# Patient Record
Sex: Male | Born: 1950 | Race: White | Hispanic: No | Marital: Married | Smoking: Never smoker
Health system: Southern US, Community
[De-identification: ages and names within clinical notes are randomized; demographics above are authoritative.]

## PROBLEM LIST (undated history)

## (undated) DIAGNOSIS — K592 Neurogenic bowel, not elsewhere classified: Secondary | ICD-10-CM

## (undated) DIAGNOSIS — I82409 Acute embolism and thrombosis of unspecified deep veins of unspecified lower extremity: Secondary | ICD-10-CM

## (undated) DIAGNOSIS — S14109A Unspecified injury at unspecified level of cervical spinal cord, initial encounter: Secondary | ICD-10-CM

## (undated) DIAGNOSIS — S069X9A Unspecified intracranial injury with loss of consciousness of unspecified duration, initial encounter: Secondary | ICD-10-CM

## (undated) DIAGNOSIS — J181 Lobar pneumonia, unspecified organism: Secondary | ICD-10-CM

## (undated) DIAGNOSIS — J9621 Acute and chronic respiratory failure with hypoxia: Secondary | ICD-10-CM

## (undated) DIAGNOSIS — I10 Essential (primary) hypertension: Secondary | ICD-10-CM

## (undated) DIAGNOSIS — R7881 Bacteremia: Secondary | ICD-10-CM

## (undated) DIAGNOSIS — N400 Enlarged prostate without lower urinary tract symptoms: Secondary | ICD-10-CM

## (undated) DIAGNOSIS — F1011 Alcohol abuse, in remission: Secondary | ICD-10-CM

---

## 1998-08-02 ENCOUNTER — Ambulatory Visit (HOSPITAL_BASED_OUTPATIENT_CLINIC_OR_DEPARTMENT_OTHER): Admission: RE | Admit: 1998-08-02 | Discharge: 1998-08-02 | Payer: Self-pay | Admitting: General Surgery

## 2011-06-20 DIAGNOSIS — S069XAA Unspecified intracranial injury with loss of consciousness status unknown, initial encounter: Secondary | ICD-10-CM | POA: Diagnosis present

## 2011-06-20 DIAGNOSIS — S069X9A Unspecified intracranial injury with loss of consciousness of unspecified duration, initial encounter: Secondary | ICD-10-CM

## 2011-06-20 HISTORY — DX: Unspecified intracranial injury with loss of consciousness of unspecified duration, initial encounter: S06.9X9A

## 2011-06-20 HISTORY — DX: Unspecified intracranial injury with loss of consciousness status unknown, initial encounter: S06.9XAA

## 2012-04-28 ENCOUNTER — Encounter (HOSPITAL_COMMUNITY): Payer: Self-pay

## 2012-04-28 ENCOUNTER — Emergency Department (HOSPITAL_COMMUNITY): Payer: 59

## 2012-04-28 ENCOUNTER — Inpatient Hospital Stay (HOSPITAL_COMMUNITY)
Admission: EM | Admit: 2012-04-28 | Discharge: 2012-05-07 | DRG: 025 | Disposition: A | Discharge status: Rehabilitation Facility | Payer: 59 | Attending: Internal Medicine | Admitting: Internal Medicine

## 2012-04-28 ENCOUNTER — Encounter (HOSPITAL_COMMUNITY): Payer: Self-pay | Admitting: Anesthesiology

## 2012-04-28 ENCOUNTER — Encounter (HOSPITAL_COMMUNITY): Payer: Self-pay | Admitting: *Deleted

## 2012-04-28 ENCOUNTER — Inpatient Hospital Stay (HOSPITAL_COMMUNITY): Payer: 59 | Admitting: Anesthesiology

## 2012-04-28 ENCOUNTER — Encounter (HOSPITAL_COMMUNITY)
Admission: EM | Disposition: A | Discharge status: Rehabilitation Facility | Payer: Self-pay | Source: Home / Self Care | Attending: Internal Medicine

## 2012-04-28 DIAGNOSIS — Y92009 Unspecified place in unspecified non-institutional (private) residence as the place of occurrence of the external cause: Secondary | ICD-10-CM

## 2012-04-28 DIAGNOSIS — J9600 Acute respiratory failure, unspecified whether with hypoxia or hypercapnia: Secondary | ICD-10-CM | POA: Diagnosis present

## 2012-04-28 DIAGNOSIS — R4182 Altered mental status, unspecified: Secondary | ICD-10-CM | POA: Diagnosis present

## 2012-04-28 DIAGNOSIS — E872 Acidosis, unspecified: Secondary | ICD-10-CM | POA: Diagnosis present

## 2012-04-28 DIAGNOSIS — T82898A Other specified complication of vascular prosthetic devices, implants and grafts, initial encounter: Secondary | ICD-10-CM | POA: Diagnosis not present

## 2012-04-28 DIAGNOSIS — E876 Hypokalemia: Secondary | ICD-10-CM | POA: Diagnosis not present

## 2012-04-28 DIAGNOSIS — Y849 Medical procedure, unspecified as the cause of abnormal reaction of the patient, or of later complication, without mention of misadventure at the time of the procedure: Secondary | ICD-10-CM | POA: Diagnosis not present

## 2012-04-28 DIAGNOSIS — E871 Hypo-osmolality and hyponatremia: Secondary | ICD-10-CM | POA: Diagnosis present

## 2012-04-28 DIAGNOSIS — J690 Pneumonitis due to inhalation of food and vomit: Secondary | ICD-10-CM | POA: Diagnosis not present

## 2012-04-28 DIAGNOSIS — S065X9A Traumatic subdural hemorrhage with loss of consciousness of unspecified duration, initial encounter: Principal | ICD-10-CM | POA: Diagnosis present

## 2012-04-28 DIAGNOSIS — J00 Acute nasopharyngitis [common cold]: Secondary | ICD-10-CM | POA: Diagnosis present

## 2012-04-28 DIAGNOSIS — F1010 Alcohol abuse, uncomplicated: Secondary | ICD-10-CM | POA: Diagnosis present

## 2012-04-28 DIAGNOSIS — S0100XA Unspecified open wound of scalp, initial encounter: Secondary | ICD-10-CM | POA: Diagnosis present

## 2012-04-28 DIAGNOSIS — I10 Essential (primary) hypertension: Secondary | ICD-10-CM | POA: Diagnosis present

## 2012-04-28 DIAGNOSIS — F1210 Cannabis abuse, uncomplicated: Secondary | ICD-10-CM | POA: Diagnosis present

## 2012-04-28 DIAGNOSIS — R339 Retention of urine, unspecified: Secondary | ICD-10-CM

## 2012-04-28 DIAGNOSIS — G9340 Encephalopathy, unspecified: Secondary | ICD-10-CM

## 2012-04-28 DIAGNOSIS — I6200 Nontraumatic subdural hemorrhage, unspecified: Secondary | ICD-10-CM

## 2012-04-28 DIAGNOSIS — I82629 Acute embolism and thrombosis of deep veins of unspecified upper extremity: Secondary | ICD-10-CM | POA: Diagnosis not present

## 2012-04-28 DIAGNOSIS — W010XXA Fall on same level from slipping, tripping and stumbling without subsequent striking against object, initial encounter: Secondary | ICD-10-CM | POA: Diagnosis present

## 2012-04-28 DIAGNOSIS — Y9210 Unspecified residential institution as the place of occurrence of the external cause: Secondary | ICD-10-CM | POA: Diagnosis not present

## 2012-04-28 DIAGNOSIS — D638 Anemia in other chronic diseases classified elsewhere: Secondary | ICD-10-CM | POA: Diagnosis present

## 2012-04-28 HISTORY — PX: CRANIOTOMY: SHX93

## 2012-04-28 HISTORY — DX: Essential (primary) hypertension: I10

## 2012-04-28 LAB — POCT I-STAT, CHEM 8
BUN: 11 mg/dL (ref 6–23)
Calcium, Ion: 1.05 mmol/L — ABNORMAL LOW (ref 1.13–1.30)
Chloride: 100 mEq/L (ref 96–112)
Glucose, Bld: 178 mg/dL — ABNORMAL HIGH (ref 70–99)
TCO2: 20 mmol/L (ref 0–100)

## 2012-04-28 LAB — PROTIME-INR
INR: 1.06 (ref 0.00–1.49)
Prothrombin Time: 13.7 seconds (ref 11.6–15.2)

## 2012-04-28 LAB — COMPREHENSIVE METABOLIC PANEL
CO2: 19 mEq/L (ref 19–32)
Calcium: 8.6 mg/dL (ref 8.4–10.5)
Creatinine, Ser: 0.56 mg/dL (ref 0.50–1.35)
GFR calc Af Amer: >90 mL/min (ref >90)
GFR calc non Af Amer: >90 mL/min (ref >90)
Glucose, Bld: 180 mg/dL — ABNORMAL HIGH (ref 70–99)
Sodium: 130 mEq/L — ABNORMAL LOW (ref 135–145)
Total Protein: 7.9 g/dL (ref 6.0–8.3)

## 2012-04-28 LAB — URINALYSIS, ROUTINE W REFLEX MICROSCOPIC
Bilirubin Urine: NEGATIVE
Ketones, ur: 15 mg/dL — AB
Leukocytes, UA: NEGATIVE
Nitrite: NEGATIVE
Protein, ur: NEGATIVE mg/dL
Urobilinogen, UA: 0.2 mg/dL (ref 0.0–1.0)

## 2012-04-28 LAB — LACTIC ACID, PLASMA: Lactic Acid, Venous: 5.7 mmol/L — ABNORMAL HIGH (ref 0.5–2.2)

## 2012-04-28 LAB — GLUCOSE, CAPILLARY: Glucose-Capillary: 107 mg/dL — ABNORMAL HIGH (ref 70–99)

## 2012-04-28 LAB — PHOSPHORUS: Phosphorus: 3.4 mg/dL (ref 2.3–4.6)

## 2012-04-28 LAB — MAGNESIUM: Magnesium: 2.2 mg/dL (ref 1.5–2.5)

## 2012-04-28 LAB — POCT I-STAT 3, ART BLOOD GAS (G3+)
Bicarbonate: 19.4 mEq/L — ABNORMAL LOW (ref 20.0–24.0)
pH, Arterial: 7.277 — ABNORMAL LOW (ref 7.350–7.450)

## 2012-04-28 LAB — PROCALCITONIN: Procalcitonin: <0.1 ng/mL

## 2012-04-28 LAB — CBC WITH DIFFERENTIAL/PLATELET
Basophils Absolute: 0 10*3/uL (ref 0.0–0.1)
Basophils Relative: 0 % (ref 0–1)
Eosinophils Absolute: 0.1 10*3/uL (ref 0.0–0.7)
Hemoglobin: 15.9 g/dL (ref 13.0–17.0)
MCH: 31.7 pg (ref 26.0–34.0)
MCHC: 36.1 g/dL — ABNORMAL HIGH (ref 30.0–36.0)
Monocytes Relative: 6 % (ref 3–12)
Neutrophils Relative %: 87 % — ABNORMAL HIGH (ref 43–77)
Platelets: 375 10*3/uL (ref 150–400)
RDW: 13 % (ref 11.5–15.5)

## 2012-04-28 LAB — RAPID URINE DRUG SCREEN, HOSP PERFORMED
Opiates: NOT DETECTED
Tetrahydrocannabinol: POSITIVE — AB

## 2012-04-28 LAB — POCT I-STAT 4, (NA,K, GLUC, HGB,HCT)
Glucose, Bld: 110 mg/dL — ABNORMAL HIGH (ref 70–99)
Hemoglobin: 12.6 g/dL — ABNORMAL LOW (ref 13.0–17.0)

## 2012-04-28 LAB — URINE MICROSCOPIC-ADD ON

## 2012-04-28 LAB — MRSA PCR SCREENING: MRSA by PCR: NEGATIVE

## 2012-04-28 SURGERY — CRANIOTOMY HEMATOMA EVACUATION SUBDURAL
Anesthesia: General | Site: Head | Laterality: Left | Wound class: Clean

## 2012-04-28 MED ORDER — DEXMEDETOMIDINE HCL IN NACL 400 MCG/100ML IV SOLN
0.4000 ug/kg/h | INTRAVENOUS | Status: DC
  Filled 2012-04-28: qty 100

## 2012-04-28 MED ORDER — ETOMIDATE 2 MG/ML IV SOLN
20.0000 mg | Freq: Once | INTRAVENOUS | Status: AC
  Administered 2012-04-28: 20 mg via INTRAVENOUS

## 2012-04-28 MED ORDER — PROPOFOL 10 MG/ML IV EMUL
INTRAVENOUS | Status: AC
  Administered 2012-04-29: 40 ug/kg/min via INTRAVENOUS
  Filled 2012-04-28: qty 100

## 2012-04-28 MED ORDER — BACITRACIN ZINC 500 UNIT/GM EX OINT
TOPICAL_OINTMENT | CUTANEOUS | Status: DC | PRN
  Administered 2012-04-28: 1 via TOPICAL

## 2012-04-28 MED ORDER — CEFAZOLIN SODIUM 1-5 GM-% IV SOLN
INTRAVENOUS | Status: DC | PRN
  Administered 2012-04-28: 2 g via INTRAVENOUS

## 2012-04-28 MED ORDER — SODIUM CHLORIDE 0.9 % IV SOLN
INTRAVENOUS | Status: DC | PRN
  Administered 2012-04-28: 15:00:00 via INTRAVENOUS

## 2012-04-28 MED ORDER — DEXMEDETOMIDINE HCL IN NACL 200 MCG/50ML IV SOLN: 0.4000 ug/kg/h | INTRAVENOUS | Status: DC

## 2012-04-28 MED ORDER — SODIUM CHLORIDE 0.9 % IV SOLN
1000.0000 mL | Freq: Once | INTRAVENOUS | Status: AC
  Administered 2012-04-28: 1000 mL via INTRAVENOUS

## 2012-04-28 MED ORDER — THROMBIN 20000 UNITS EX SOLR
CUTANEOUS | Status: DC | PRN
  Administered 2012-04-28 (×2): via TOPICAL

## 2012-04-28 MED ORDER — FOLIC ACID 1 MG PO TABS
1.0000 mg | ORAL_TABLET | Freq: Every day | ORAL | Status: DC
  Administered 2012-04-29 – 2012-05-07 (×8): 1 mg via ORAL
  Filled 2012-04-28 (×9): qty 1

## 2012-04-28 MED ORDER — VECURONIUM BROMIDE 10 MG IV SOLR
INTRAVENOUS | Status: DC | PRN
  Administered 2012-04-28: 10 mg via INTRAVENOUS
  Administered 2012-04-28 (×2): 5 mg via INTRAVENOUS

## 2012-04-28 MED ORDER — FENTANYL CITRATE 0.05 MG/ML IJ SOLN
50.0000 ug | INTRAMUSCULAR | Status: DC | PRN
  Administered 2012-04-28: 100 ug via INTRAVENOUS
  Administered 2012-04-29: 75 ug via INTRAVENOUS
  Administered 2012-04-29: 100 ug via INTRAVENOUS
  Administered 2012-04-29 – 2012-05-02 (×6): 50 ug via INTRAVENOUS
  Filled 2012-04-28 (×9): qty 2

## 2012-04-28 MED ORDER — CEFAZOLIN SODIUM-DEXTROSE 2-3 GM-% IV SOLR
2.0000 g | Freq: Three times a day (TID) | INTRAVENOUS | Status: AC
  Administered 2012-04-29 (×4): 2 g via INTRAVENOUS
  Filled 2012-04-28 (×4): qty 50

## 2012-04-28 MED ORDER — FENTANYL CITRATE 0.05 MG/ML IJ SOLN
INTRAMUSCULAR | Status: DC | PRN
  Administered 2012-04-28: 125 ug via INTRAVENOUS
  Administered 2012-04-28 (×2): 250 ug via INTRAVENOUS
  Administered 2012-04-28: 125 ug via INTRAVENOUS

## 2012-04-28 MED ORDER — SODIUM CHLORIDE 0.9 % IV SOLN
500.0000 mg | INTRAVENOUS | Status: AC
  Administered 2012-04-28: 500 mg via INTRAVENOUS
  Filled 2012-04-28: qty 5

## 2012-04-28 MED ORDER — PHENYLEPHRINE HCL 10 MG/ML IJ SOLN
10.0000 mg | INTRAVENOUS | Status: DC | PRN
  Administered 2012-04-28: 60 ug/min via INTRAVENOUS

## 2012-04-28 MED ORDER — SUCCINYLCHOLINE CHLORIDE 20 MG/ML IJ SOLN
100.0000 mg | Freq: Once | INTRAMUSCULAR | Status: AC
  Administered 2012-04-28: 100 mg via INTRAVENOUS

## 2012-04-28 MED ORDER — MIDAZOLAM HCL 5 MG/5ML IJ SOLN
INTRAMUSCULAR | Status: DC | PRN
  Administered 2012-04-28: 4 mg via INTRAVENOUS

## 2012-04-28 MED ORDER — DEXMEDETOMIDINE HCL IN NACL 200 MCG/50ML IV SOLN
INTRAVENOUS | Status: DC | PRN
  Administered 2012-04-28: 0.7 ug/kg/h via INTRAVENOUS

## 2012-04-28 MED ORDER — THIAMINE HCL 100 MG/ML IJ SOLN
100.0000 mg | Freq: Every day | INTRAMUSCULAR | Status: DC
  Administered 2012-04-29 – 2012-05-04 (×6): 100 mg via INTRAVENOUS
  Filled 2012-04-28 (×6): qty 1

## 2012-04-28 MED ORDER — MANNITOL 20 % IV SOLN
INTRAVENOUS | Status: DC | PRN
  Administered 2012-04-28: 15:00:00 via INTRAVENOUS

## 2012-04-28 MED ORDER — SODIUM CHLORIDE 0.9 % IV SOLN: 1000.0000 mL | Freq: Once | INTRAVENOUS | Status: DC

## 2012-04-28 MED ORDER — CHLORHEXIDINE GLUCONATE 0.12 % MT SOLN
15.0000 mL | Freq: Two times a day (BID) | OROMUCOSAL | Status: DC
  Administered 2012-04-28 – 2012-05-02 (×8): 15 mL via OROMUCOSAL
  Filled 2012-04-28 (×8): qty 15

## 2012-04-28 MED ORDER — HEMOSTATIC AGENTS (NO CHARGE) OPTIME
TOPICAL | Status: DC | PRN
  Administered 2012-04-28: 1 via TOPICAL

## 2012-04-28 MED ORDER — LIDOCAINE HCL 1 % IJ SOLN
INTRAMUSCULAR | Status: DC | PRN
  Administered 2012-04-28: 10 mL

## 2012-04-28 MED ORDER — SODIUM CHLORIDE 0.9 % IV SOLN
250.0000 mL | INTRAVENOUS | Status: DC | PRN
  Administered 2012-04-29: 10 mL via INTRAVENOUS
  Administered 2012-04-29: 250 mL via INTRAVENOUS

## 2012-04-28 MED ORDER — SODIUM CHLORIDE 0.9 % IV SOLN: 1000.0000 mL | INTRAVENOUS | Status: DC

## 2012-04-28 MED ORDER — 0.9 % SODIUM CHLORIDE (POUR BTL) OPTIME
TOPICAL | Status: DC | PRN
  Administered 2012-04-28 (×2): 1000 mL

## 2012-04-28 MED ORDER — BIOTENE DRY MOUTH MT LIQD
15.0000 mL | Freq: Four times a day (QID) | OROMUCOSAL | Status: DC
  Administered 2012-04-29 – 2012-05-02 (×16): 15 mL via OROMUCOSAL

## 2012-04-28 MED ORDER — PANTOPRAZOLE SODIUM 40 MG IV SOLR
40.0000 mg | Freq: Every day | INTRAVENOUS | Status: DC
  Administered 2012-04-28 – 2012-04-29 (×2): 40 mg via INTRAVENOUS
  Filled 2012-04-28 (×3): qty 40

## 2012-04-28 MED ORDER — SODIUM CHLORIDE 0.9 % IR SOLN
Status: DC | PRN
  Administered 2012-04-28: 15:00:00

## 2012-04-28 MED ORDER — SODIUM CHLORIDE 0.9 % IV SOLN
INTRAVENOUS | Status: DC
  Administered 2012-04-29: 10:00:00 via INTRAVENOUS
  Administered 2012-05-01: 75 mL/h via INTRAVENOUS
  Administered 2012-05-02: 11:00:00 via INTRAVENOUS

## 2012-04-28 MED ORDER — PROPOFOL 10 MG/ML IV EMUL
5.0000 ug/kg/min | INTRAVENOUS | Status: DC
  Administered 2012-04-28: 15 ug/kg/min via INTRAVENOUS
  Administered 2012-04-28: 30 ug/kg/min via INTRAVENOUS
  Administered 2012-04-29 (×3): 40 ug/kg/min via INTRAVENOUS
  Administered 2012-04-29 – 2012-05-01 (×8): 50 ug/kg/min via INTRAVENOUS
  Administered 2012-05-01: 20 ug/kg/min via INTRAVENOUS
  Administered 2012-05-01: 10 ug/kg/min via INTRAVENOUS
  Administered 2012-05-01: 50 ug/kg/min via INTRAVENOUS
  Administered 2012-05-02: 20 ug/kg/min via INTRAVENOUS
  Filled 2012-04-28 (×17): qty 100

## 2012-04-28 SURGICAL SUPPLY — 56 items
BAG DECANTER FOR FLEXI CONT (MISCELLANEOUS) ×2 IMPLANT
BANDAGE GAUZE 4  KLING STR (GAUZE/BANDAGES/DRESSINGS) ×4 IMPLANT
BANDAGE GAUZE ELAST BULKY 4 IN (GAUZE/BANDAGES/DRESSINGS) ×4 IMPLANT
BLADE SURG ROTATE 9660 (MISCELLANEOUS) ×2 IMPLANT
BRUSH SCRUB EZ PLAIN DRY (MISCELLANEOUS) ×4 IMPLANT
BUR ACORN 6.0 PRECISION (BURR) ×2 IMPLANT
BUR ROUTER D-58 CRANI (BURR) ×2 IMPLANT
CANISTER SUCTION 2500CC (MISCELLANEOUS) ×4 IMPLANT
CONT SPEC 4OZ CLIKSEAL STRL BL (MISCELLANEOUS) ×2 IMPLANT
CORDS BIPOLAR (ELECTRODE) ×2 IMPLANT
COVER TABLE BACK 60X90 (DRAPES) ×2 IMPLANT
DECANTER SPIKE VIAL GLASS SM (MISCELLANEOUS) ×4 IMPLANT
DRAIN CHANNEL 7F FF FLAT (WOUND CARE) ×2 IMPLANT
DRAPE INCISE IOBAN 66X45 STRL (DRAPES) ×2 IMPLANT
DRAPE NEUROLOGICAL W/INCISE (DRAPES) ×2 IMPLANT
DRAPE WARM FLUID 44X44 (DRAPE) ×2 IMPLANT
DRESSING TELFA 8X3 (GAUZE/BANDAGES/DRESSINGS) ×4 IMPLANT
ELECT CAUTERY BLADE 6.4 (BLADE) ×2 IMPLANT
ELECT REM PT RETURN 9FT ADLT (ELECTROSURGICAL) ×2
ELECTRODE REM PT RTRN 9FT ADLT (ELECTROSURGICAL) ×1 IMPLANT
EVACUATOR SILICONE 100CC (DRAIN) ×2 IMPLANT
GLOVE BIOGEL PI IND STRL 6.5 (GLOVE) ×1 IMPLANT
GLOVE BIOGEL PI IND STRL 7.5 (GLOVE) ×1 IMPLANT
GLOVE BIOGEL PI IND STRL 8.5 (GLOVE) ×1 IMPLANT
GLOVE BIOGEL PI INDICATOR 6.5 (GLOVE) ×1
GLOVE BIOGEL PI INDICATOR 7.5 (GLOVE) ×1
GLOVE BIOGEL PI INDICATOR 8.5 (GLOVE) ×1
GLOVE ECLIPSE 7.5 STRL STRAW (GLOVE) ×6 IMPLANT
GLOVE ECLIPSE 8.5 STRL (GLOVE) ×2 IMPLANT
GLOVE EXAM NITRILE MD LF STRL (GLOVE) ×4 IMPLANT
GLOVE SURG SS PI 6.5 STRL IVOR (GLOVE) ×2 IMPLANT
GOWN STRL NON-REIN LRG LVL3 (GOWN DISPOSABLE) ×6 IMPLANT
GOWN STRL REIN 2XL LVL4 (GOWN DISPOSABLE) ×2 IMPLANT
HEMOSTAT SURGICEL 2X14 (HEMOSTASIS) ×2 IMPLANT
KIT BASIN OR (CUSTOM PROCEDURE TRAY) ×2 IMPLANT
KIT ROOM TURNOVER OR (KITS) ×2 IMPLANT
NEEDLE HYPO 22GX1.5 SAFETY (NEEDLE) ×2 IMPLANT
NS IRRIG 1000ML POUR BTL (IV SOLUTION) ×4 IMPLANT
PACK CRANIOTOMY (CUSTOM PROCEDURE TRAY) ×2 IMPLANT
PAD ARMBOARD 7.5X6 YLW CONV (MISCELLANEOUS) ×6 IMPLANT
PERFORATOR LRG  14-11MM (BIT) ×1
PERFORATOR LRG 14-11MM (BIT) ×1 IMPLANT
PLATE 1.5  2HOLE LNG NEURO (Plate) ×4 IMPLANT
PLATE 1.5 2HOLE LNG NEURO (Plate) ×4 IMPLANT
SCREW SELF DRILL HT 1.5/4MM (Screw) ×16 IMPLANT
SPONGE GAUZE 4X4 12PLY (GAUZE/BANDAGES/DRESSINGS) ×4 IMPLANT
SPONGE SURGIFOAM ABS GEL 100C (HEMOSTASIS) ×2 IMPLANT
STAPLER SKIN PROX WIDE 3.9 (STAPLE) ×4 IMPLANT
SUT NURALON 4 0 TR CR/8 (SUTURE) ×6 IMPLANT
SUT VICRYL 2 0 18  TIES (SUTURE) ×9
SUT VICRYL 2 0 18 TIES (SUTURE) ×9 IMPLANT
SUT VICRYL 3 0 (SUTURE) ×2 IMPLANT
SYR 20ML ECCENTRIC (SYRINGE) ×2 IMPLANT
SYR CONTROL 10ML LL (SYRINGE) ×2 IMPLANT
TOWEL OR 17X26 10 PK STRL BLUE (TOWEL DISPOSABLE) ×4 IMPLANT
WATER STERILE IRR 1000ML POUR (IV SOLUTION) ×2 IMPLANT

## 2012-04-28 NOTE — ED Notes (Signed)
Pt transported to CT on cardiac monitor, respiratory therapy managing ventilator.

## 2012-04-28 NOTE — Progress Notes (Signed)
Nursing Note:  22:11   04/28/12  Patient returned from OR with Precedex hanging but not infusing. 60 ml wasted at 22:06 with Charge Nurse Konrad Felix and Stephanie Coup, RN.

## 2012-04-28 NOTE — ED Notes (Signed)
Per EMS wife found pt in the yard, started CPR, incontinent, L pupil unreastive, gave 2.5 versed enroute, no IV, supporting breathing and airway

## 2012-04-28 NOTE — ED Notes (Signed)
Neurosurgeon at the beside 

## 2012-04-28 NOTE — Progress Notes (Signed)
Patient ID: Wayne Buckley, male   DOB: 08/27/1950, 61 y.o.   MRN: [id number] Patient underwent crani for hematoma earlier today. Tolerated procedure, which included evacuation of subdural hematoma, evacuation of left frontal hemorrhage, and partial left frontal lobectomy. Stable post op. Plan for repeat CT am tomorrow.

## 2012-04-28 NOTE — Consult Note (Signed)
Reason for Consult:BRAIN HEMORRHAGE Referring Physician: ED MD  Wayne Buckley is an 61 y.o. male.  HPI: The patient is a 61 yo male with only a history of hypertension who was found down this morning. He was brought to [facility] ED where he was found to be comatose. CT head was done and showed large left sided hematoma with mass effect.   Past Medical History  Diagnosis Date  . Hypertension     History reviewed. No pertinent past surgical history.  History reviewed. No pertinent family history.  Social History:  reports that he has never smoked. He has never used smokeless tobacco. He reports that he drinks alcohol. His drug history not on file.  Allergies: No Known Allergies  Medications: I have reviewed the patient's current medications.  Results for orders placed during the hospital encounter of 04/28/12 (from the past 48 hour(s))  POCT I-STAT, CHEM 8     Status: Abnormal   Collection Time   04/28/12 10:57 AM      Component Value Range Comment   Sodium 134 (*) 135 - 145 mEq/L    Potassium 4.3  3.5 - 5.1 mEq/L    Chloride 100  96 - 112 mEq/L    BUN 11  6 - 23 mg/dL    Creatinine, Ser 1.61  0.50 - 1.35 mg/dL    Glucose, Bld 096 (*) 70 - 99 mg/dL    Calcium, Ion 0.45 (*) 1.13 - 1.30 mmol/L    TCO2 20  0 - 100 mmol/L    Hemoglobin 16.7  13.0 - 17.0 g/dL    HCT 40.9  81.1 - 91.4 %   COMPREHENSIVE METABOLIC PANEL     Status: Abnormal   Collection Time   04/28/12 11:05 AM      Component Value Range Comment   Sodium 130 (*) 135 - 145 mEq/L    Potassium 4.6  3.5 - 5.1 mEq/L HEMOLYSIS AT THIS LEVEL MAY AFFECT RESULT   Chloride 92 (*) 96 - 112 mEq/L    CO2 19  19 - 32 mEq/L    Glucose, Bld 180 (*) 70 - 99 mg/dL    BUN 10  6 - 23 mg/dL    Creatinine, Ser 7.82  0.50 - 1.35 mg/dL    Calcium 8.6  8.4 - 95.6 mg/dL    Total Protein 7.9  6.0 - 8.3 g/dL    Albumin 4.3  3.5 - 5.2 g/dL    AST 88 (*) 0 - 37 U/L    ALT 42  0 - 53 U/L HEMOLYSIS AT THIS LEVEL MAY AFFECT RESULT   Alkaline  Phosphatase 60  39 - 117 U/L HEMOLYSIS AT THIS LEVEL MAY AFFECT RESULT   Total Bilirubin 0.3  0.3 - 1.2 mg/dL    GFR calc non Af Amer >90  >90 mL/min    GFR calc Af Amer >90  >90 mL/min   PROTIME-INR     Status: Normal   Collection Time   04/28/12 11:05 AM      Component Value Range Comment   Prothrombin Time 13.7  11.6 - 15.2 seconds    INR 1.06  0.00 - 1.49   CBC WITH DIFFERENTIAL     Status: Abnormal   Collection Time   04/28/12 11:05 AM      Component Value Range Comment   WBC 26.8 (*) 4.0 - 10.5 K/uL REPEATED TO VERIFY   RBC 5.01  4.22 - 5.81 MIL/uL    Hemoglobin 15.9  13.0 -  17.0 g/dL    HCT 16.1  09.6 - 04.5 %    MCV 87.8  78.0 - 100.0 fL    MCH 31.7  26.0 - 34.0 pg    MCHC 36.1 (*) 30.0 - 36.0 g/dL    RDW 40.9  81.1 - 91.4 %    Platelets 375  150 - 400 K/uL    Neutrophils Relative 87 (*) 43 - 77 %    Neutro Abs 23.2 (*) 1.7 - 7.7 K/uL    Lymphocytes Relative 7 (*) 12 - 46 %    Lymphs Abs 2.0  0.7 - 4.0 K/uL    Monocytes Relative 6  3 - 12 %    Monocytes Absolute 1.5 (*) 0.1 - 1.0 K/uL    Eosinophils Relative 0  0 - 5 %    Eosinophils Absolute 0.1  0.0 - 0.7 K/uL    Basophils Relative 0  0 - 1 %    Basophils Absolute 0.0  0.0 - 0.1 K/uL   LACTIC ACID, PLASMA     Status: Abnormal   Collection Time   04/28/12 11:06 AM      Component Value Range Comment   Lactic Acid, Venous 5.7 (*) 0.5 - 2.2 mmol/L   ETHANOL     Status: Abnormal   Collection Time   04/28/12 12:06 PM      Component Value Range Comment   Alcohol, Ethyl (B) 121 (*) 0 - 11 mg/dL   URINALYSIS, ROUTINE W REFLEX MICROSCOPIC     Status: Abnormal   Collection Time   04/28/12 12:35 PM      Component Value Range Comment   Color, Urine YELLOW  YELLOW    APPearance CLEAR  CLEAR    Specific Gravity, Urine 1.009  1.005 - 1.030    pH 6.0  5.0 - 8.0    Glucose, UA 250 (*) NEGATIVE mg/dL    Hgb urine dipstick SMALL (*) NEGATIVE    Bilirubin Urine NEGATIVE  NEGATIVE    Ketones, ur 15 (*) NEGATIVE mg/dL     Protein, ur NEGATIVE  NEGATIVE mg/dL    Urobilinogen, UA 0.2  0.0 - 1.0 mg/dL    Nitrite NEGATIVE  NEGATIVE    Leukocytes, UA NEGATIVE  NEGATIVE   URINE RAPID DRUG SCREEN (HOSP PERFORMED)     Status: Abnormal   Collection Time   04/28/12 12:35 PM      Component Value Range Comment   Opiates NONE DETECTED  NONE DETECTED    Cocaine NONE DETECTED  NONE DETECTED    Benzodiazepines NONE DETECTED  NONE DETECTED    Amphetamines NONE DETECTED  NONE DETECTED    Tetrahydrocannabinol POSITIVE (*) NONE DETECTED    Barbiturates NONE DETECTED  NONE DETECTED   URINE MICROSCOPIC-ADD ON     Status: Normal   Collection Time   04/28/12 12:35 PM      Component Value Range Comment   RBC / HPF 3-6  <3 RBC/hpf   POCT I-STAT 3, BLOOD GAS (G3+)     Status: Abnormal   Collection Time   04/28/12 12:58 PM      Component Value Range Comment   pH, Arterial 7.277 (*) 7.350 - 7.450    pCO2 arterial 41.6  35.0 - 45.0 mmHg    pO2, Arterial 50.0 (*) 80.0 - 100.0 mmHg    Bicarbonate 19.4 (*) 20.0 - 24.0 mEq/L    TCO2 21  0 - 100 mmol/L    O2 Saturation 80.0  Acid-base deficit 7.0 (*) 0.0 - 2.0 mmol/L    Collection site RADIAL, ALLEN'S TEST ACCEPTABLE      Drawn by Operator      Sample type ARTERIAL       Ct Head Wo Contrast  04/28/2012  *RADIOLOGY REPORT*  Clinical Data:  Altered mental status.  Found down.  CT HEAD WITHOUT CONTRAST CT CERVICAL SPINE WITHOUT CONTRAST  Technique:  Multidetector CT imaging of the head and cervical spine was performed following the standard protocol without intravenous contrast.  Multiplanar CT image reconstructions of the cervical spine were also generated.  Comparison:   None  CT HEAD  Findings: There is a large intraparenchymal hemorrhage involving the left frontal region.  This measures 2.8 x 5.6 by 4 cm.  Small left subdural hemorrhage is identified, measuring 8.4 mm maximum diameter.  There is left to right midline shift, measuring 12 mm at its greatest point. Suspect early  left uncal herniation. Subarachnoid blood is identified within the right lateral ventricle, occipital horn.  There is blood along the tentorium and along the falx.  There is effacement of the lateral ventricles, left greater than right.  Bone windows show a fracture of the right occipital bone, extending into the skull base and associated significant scalp hematoma.  There is significant opacity throughout the ethmoid and maxillary air cells, consistent with acute / chronic sinusitis.  IMPRESSION:  1.  Significant left frontal lobe intraparenchymal hematoma. 2.  Subdural hematoma involving the left frontal, parietal regions as well as the falx and tentorium. 3.  Subarachnoid blood within the left parietal region and occipital horn of the right lateral ventricle. 4.  Left to right midline shift. 5.  Suspect early uncal herniation.  Critical test results telephoned to Dr. Jeraldine Loots at the time of interpretation on date 04/28/2012 at time 12:12 p.m.  CT CERVICAL SPINE  Findings: Lower right skull base fracture is again identified. This extends from the right occipital region to the foramen magnum. There are degenerative changes throughout the cervical spine.  No evidence for acute cervical spine fracture.  Alignment is normal. The patient has endotracheal tube and nasogastric tube in place. Images of the lung apices are unremarkable.  IMPRESSION:  1.  Significant degenerative changes throughout the cervical spine. 2.  Skull base fracture. 3.  No evidence for acute fracture of the cervical spine.   Original Report Authenticated By: Norva Pavlov, M.D.    Ct Cervical Spine Wo Contrast  04/28/2012  *RADIOLOGY REPORT*  Clinical Data:  Altered mental status.  Found down.  CT HEAD WITHOUT CONTRAST CT CERVICAL SPINE WITHOUT CONTRAST  Technique:  Multidetector CT imaging of the head and cervical spine was performed following the standard protocol without intravenous contrast.  Multiplanar CT image reconstructions of the  cervical spine were also generated.  Comparison:   None  CT HEAD  Findings: There is a large intraparenchymal hemorrhage involving the left frontal region.  This measures 2.8 x 5.6 by 4 cm.  Small left subdural hemorrhage is identified, measuring 8.4 mm maximum diameter.  There is left to right midline shift, measuring 12 mm at its greatest point. Suspect early left uncal herniation. Subarachnoid blood is identified within the right lateral ventricle, occipital horn.  There is blood along the tentorium and along the falx.  There is effacement of the lateral ventricles, left greater than right.  Bone windows show a fracture of the right occipital bone, extending into the skull base and associated significant scalp hematoma.  There  is significant opacity throughout the ethmoid and maxillary air cells, consistent with acute / chronic sinusitis.  IMPRESSION:  1.  Significant left frontal lobe intraparenchymal hematoma. 2.  Subdural hematoma involving the left frontal, parietal regions as well as the falx and tentorium. 3.  Subarachnoid blood within the left parietal region and occipital horn of the right lateral ventricle. 4.  Left to right midline shift. 5.  Suspect early uncal herniation.  Critical test results telephoned to Dr. Jeraldine Loots at the time of interpretation on date 04/28/2012 at time 12:12 p.m.  CT CERVICAL SPINE  Findings: Lower right skull base fracture is again identified. This extends from the right occipital region to the foramen magnum. There are degenerative changes throughout the cervical spine.  No evidence for acute cervical spine fracture.  Alignment is normal. The patient has endotracheal tube and nasogastric tube in place. Images of the lung apices are unremarkable.  IMPRESSION:  1.  Significant degenerative changes throughout the cervical spine. 2.  Skull base fracture. 3.  No evidence for acute fracture of the cervical spine.   Original Report Authenticated By: Norva Pavlov, M.D.    Dg  Chest Port 1 View  04/28/2012  *RADIOLOGY REPORT*  Clinical Data: Post intubation  PORTABLE CHEST - 1 VIEW  Comparison: None.  Findings: Cardiomediastinal silhouette is unremarkable.  NG tube in place with tip in proximal stomach.  Endotracheal tube with tip in upper trachea about 8.2 cm above the carina.  No acute infiltrate or pulmonary edema.  No diagnostic pneumothorax.  IMPRESSION: No acute disease.  Endotracheal tube with tip in upper trachea about 8.2 cm above the carina.  No diagnostic pneumothorax.   Original Report Authenticated By: Natasha Mead, M.D.     Review of systems not obtained due to patient factors. Blood pressure 129/81, pulse 91, temperature 97.7 F (36.5 C), temperature source Other (Comment), resp. rate 16, height 5\' 10"  (1.778 m), weight 80 kg (176 lb 5.9 oz), SpO2 100.00%. His eyes are closed. Pupils are reactive. There is a bit of posturing to painful stim. He is intubated.  Assessment/Plan: He has a large left frontal hematoma as well as a left sided subdural, and low fronto temporal contusions. He has 11.50mm of midline shift left to right. I have had a long discussion with the family regarding the situation. I have explained the risks and benefits of surgery, and tried to spell out the most likely outcomes. I have explained the great chance of a chronic vegetative state vs a functional recovery. They appear to understand, and wish for [place] to try surgery. Will proceed asap.  Reinaldo Meeker, MD 04/28/2012, 1:30 PM

## 2012-04-28 NOTE — ED Notes (Signed)
Pt returned to exam room from CT scan. Remains sedated on ventilator. Vital signs stable.

## 2012-04-28 NOTE — H&P (Signed)
PULMONARY  / CRITICAL CARE MEDICINE  Name: Wayne Buckley MRN: [id number] DOB: 24-May-1951    LOS: 0  REFERRING MD :  Jeraldine Loots (EDP)   CHIEF COMPLAINT:  AMS, vent   BRIEF PATIENT DESCRIPTION:  61 yo male with hx HTN admitted 11/10 with AMS and SDH after fall.  Intubated for airway protection.   LINES / TUBES: ETT 11/10>>>  CULTURES: none  ANTIBIOTICS: none  SIGNIFICANT EVENTS:  CT head 11/10>>> 1. Significant left frontal lobe intraparenchymal hematoma. 2. Subdural hematoma involving the left frontal, parietal regions as well as the falx and tentorium. 3. Subarachnoid blood within the left parietal region and occipital horn of the right lateral ventricle. 4. Left to right midline shift. 5. Suspect early uncal herniation.   LEVEL OF CARE:  ICU PRIMARY SERVICE:  PCCM CONSULTANTS:  Neurosurgery  CODE STATUS: full DIET:  Npo  DVT Px:  SCD's  GI Px:  protonix  HISTORY OF PRESENT ILLNESS:  61 yo male with hx HTN, heavy ETOH presented 11/10 after being found unresponsive outside by wife. Unknown downtime - wife heard a sound about an hour before finding him.  Pt was out most of the night drinking (drinks daily, heavily on the weekends, unsure if he has even had withdrawal as he has never has a period of time without drinking) and when wife awoke in the am he was lying unresponsive on his back on the deck.  Wife started CPR although she was unsure if he was pulseless, on EMS arrival he had strong pulse and agonal respirations.  Did have laceration/hematoma to back of head and some cuts/scrapes on arms and legs.  Prior to admit was being treated for a "head cold" with amoxicillin but had been feeling better.  Poor GCS on arrival, intubated, se ETOH 121  PAST MEDICAL HISTORY :  Past Medical History  Diagnosis Date  . Hypertension    History reviewed. No pertinent past surgical history. Prior to Admission medications   Not on File   No Known Allergies  FAMILY HISTORY:  History  reviewed. No pertinent family history. SOCIAL HISTORY:  reports that he has never smoked. He has never used smokeless tobacco. He reports that he drinks alcohol. His drug history not on file.  REVIEW OF SYSTEMS:  Unable - per wife.  See HPI.    INTERVAL HISTORY:   VITAL SIGNS: Temp:  [93.4 F (34.1 C)-93.6 F (34.2 C)] 93.6 F (34.2 C) (11/10 1125) Pulse Rate:  [52-103] 97  (11/10 1205) Resp:  [14-20] 16  (11/10 1205) BP: (117-213)/(67-108) 117/68 mmHg (11/10 1205) SpO2:  [92 %-100 %] 100 % (11/10 1205) FiO2 (%):  [100 %] 100 % (11/10 1205) Weight:  [176 lb 5.9 oz (80 kg)] 176 lb 5.9 oz (80 kg) (11/10 1116)   PHYSICAL EXAMINATION: General:  wdwn male, NAD on vent in ER Neuro:  Sedated on propofol, unresponsive on arrival per nsg, PERRL 4mm brisk, withdraws BLE to pain HEENT:  Mm dry, no JVD, c-collar Cardiovascular:  s1s2 irreg Lungs:  resps even non labored on vent, cta  Abdomen:  Soft, +bs Ext: warm and dry, no edema   DIAGNOSES: Active Problems:  * No active hospital problems. *    ASSESSMENT / PLAN: PULMONARY No results found for this basename: PHART:3,PCO2ART:3,PO2ART:3,HCO3:3,O2SAT:3 in the last 168 hours  Ventilator Settings: Vent Mode:  [-] PRVC FiO2 (%):  [100 %] 100 % Set Rate:  [16 bmp] 16 bmp Vt Set:  [580 mL] 580 mL PEEP:  [  5 cmH20] 5 cmH20 Plateau Pressure:  [15 cmH20] 15 cmH20 CXR:  11/10>> clear, ETT high - insert 3-4  ASSESSMENT: Acute VDRF - r/t AMS/neuro event   PLAN:   Cont vent support, 8cc/kg  F/u CXR   CARDIOVASCULAR  Lab 04/28/12 1106  TROPONINI --  CKTOTAL --  CKMB --  LATICACIDVEN 5.7*  PROBNP --  O2SATVEN --    ECG:  Afib, incomplete LBBB  ASSESSMENT:  Hx HTN  ?Afib   PLAN:  F/u card panel    RENAL  Lab 04/28/12 1105 04/28/12 1057  NA 130* 134*  K 4.6 4.3  CL 92* 100  CO2 19 --  BUN 10 11  CREATININE 0.56 1.00  CALCIUM 8.6 --  MG -- --  PHOS -- --   Intake/Output    None     ASSESSMENT:     Hyponatremia  Lactic acidosis   PLAN:   F/u chem  F/u lactate    GASTROINTESTINAL  Lab 04/28/12 1105  AST 88*  ALT 42  ALKPHOS 60  PROT 7.9  ALBUMIN 4.3    ASSESSMENT:   Hx ETOH -se ETOH 121 UDS- THC +  PLAN:   Int f/u LFTS   HEMATOLOGIC  Lab 04/28/12 1105 04/28/12 1057  HGB 15.9 16.7  HCT 44.0 49.0  PLT 375 --  INR 1.06 --  APTT -- --    ASSESSMENT:   No acute issue  PLAN:  SCD's for dvt proph in setting SDH   INFECTIOUS  Lab 04/28/12 1105  PROCALCITON --  WBC 26.8*    ASSESSMENT:   Leukocytosis - unknown etiology. CXR ess clear.   Recent "cold" on current abx   PLAN:   Urine, sputum culture  Check pct   ENDOCRINE CBG No results found for this basename: GLUCAP:5 in the last 168 hours  ASSESSMENT:   No active issue   PLAN:   Monitor blood sugar on chem   NEUROLOGIC  ASSESSMENT:   AMS  Fall  SDH -- extensive with L->R shift   PLAN:   Neurosurgery to see - guarded prognosis explained to wife Likely to OR  Propofol for sedation   WHITEHEART,KATHRYN, NP 04/28/2012  1:00 PM Pager: [phone] or [phone]   I have personally obtained a history, examined the patient, evaluated laboratory and imaging results, formulated the assessment and plan and placed orders.  CRITICAL CARE: The patient is critically ill with multiple organ systems failure and requires high complexity decision making for assessment and support, frequent evaluation and titration of therapies, application of advanced monitoring technologies and extensive interpretation of multiple databases. Critical Care Time devoted to patient care services described in this note is 45  minutes.   Lindon Kiel V.

## 2012-04-28 NOTE — Anesthesia Postprocedure Evaluation (Signed)
  Anesthesia Post-op Note  Patient: Wayne Buckley  Procedure(s) Performed: Procedure(s) (LRB) with comments: CRANIOTOMY HEMATOMA EVACUATION SUBDURAL (Left) - Left Craniotomy for Subdural Hematoma  Patient Location: NICU  Anesthesia Type:General  Level of Consciousness: sedated and Patient remains intubated per anesthesia plan  Airway and Oxygen Therapy: Patient remains intubated per anesthesia plan and Patient placed on Ventilator (see vital sign flow sheet for setting)  Post-op Pain: none  Post-op Assessment: Post-op Vital signs reviewed, Patient's Cardiovascular Status Stable, Respiratory Function Stable, Patent Airway, No signs of Nausea or vomiting and Pain level controlled  Post-op Vital Signs: stable  Complications: No apparent anesthesia complications

## 2012-04-28 NOTE — Transfer of Care (Signed)
Immediate Anesthesia Transfer of Care Note  Patient: Wayne Buckley  Procedure(s) Performed: Procedure(s) (LRB) with comments: CRANIOTOMY HEMATOMA EVACUATION SUBDURAL (Left) - Left Craniotomy for Subdural Hematoma  Patient Location: NICU  Anesthesia Type:General  Level of Consciousness: unresponsive and Patient remains intubated per anesthesia plan  Airway & Oxygen Therapy: Patient remains intubated per anesthesia plan and Patient placed on Ventilator (see vital sign flow sheet for setting)  Post-op Assessment: Report given to PACU RN and Post -op Vital signs reviewed and stable  Post vital signs: Reviewed and stable  Complications: No apparent anesthesia complications

## 2012-04-28 NOTE — ED Provider Notes (Signed)
History     CSN: [id number]  Arrival date & time 04/28/12  1047   First MD Initiated Contact with Patient 04/28/12 1104      Chief Complaint  Patient presents with  . Altered Mental Status    (Consider location/radiation/quality/duration/timing/severity/associated sxs/prior treatment) HPI  The patient presents in extremis after being found unresponsive by his wife.  Per EMS the patient's wife found him on the ground outside of her house.  She had last seen him in normal condition approximately 7 hours prior to finding him.  She notes that he was out with friends, returned home at some point, and she found him outside of her door. The patient's wife states that he has been in his usual state of health. On arrival the patient is incapable of providing any history of present illness.   Past Medical History  Diagnosis Date  . Hypertension     History reviewed. No pertinent past surgical history.  History reviewed. No pertinent family history.  History  Substance Use Topics  . Smoking status: Never Smoker   . Smokeless tobacco: Never Used  . Alcohol Use: Yes     Comment: daily       Review of Systems  Unable to perform ROS: Mental status change    Allergies  Review of patient's allergies indicates no known allergies.  Home Medications  No current outpatient prescriptions on file.  BP 129/81  Pulse 91  Temp 97.7 F (36.5 C) (Other (Comment))  Resp 16  Ht 5\' 10"  (1.778 m)  Wt 176 lb 5.9 oz (80 kg)  BMI 25.31 kg/m2  SpO2 100%  Physical Exam  Nursing note and vitals reviewed. Constitutional:       Patient on a long spine board, with cervical collar, moving all 4 extremities minimally  HENT:       Right posterior scalp hematoma, superficial abrasion of the for head  Eyes: Right eye exhibits no discharge. Left eye exhibits no discharge.       The patient's eyes are mid range, nonreactive.  There is no blink response with either high eye  Neck: No tracheal  deviation present.  Cardiovascular: Normal rate and regular rhythm.   Pulmonary/Chest: Breath sounds normal.  Abdominal: Soft. He exhibits no distension.  Genitourinary:       The patient was incontinent of stool and urine  Musculoskeletal:       No obvious deformities  Neurological:       The patient moves all 4 extremities minimally, spontaneously. The patient had no visual tracking, no other spontaneous motion, no action to sternal rub  Psychiatric:       Unable to assess    ED Course  Procedures (including critical care time)  Labs Reviewed  POCT I-STAT, CHEM 8 - Abnormal; Notable for the following:    Sodium 134 (*)     Glucose, Bld 178 (*)     Calcium, Ion 1.05 (*)     All other components within normal limits  COMPREHENSIVE METABOLIC PANEL - Abnormal; Notable for the following:    Sodium 130 (*)     Chloride 92 (*)     Glucose, Bld 180 (*)     AST 88 (*)     All other components within normal limits  LACTIC ACID, PLASMA - Abnormal; Notable for the following:    Lactic Acid, Venous 5.7 (*)     All other components within normal limits  CBC WITH DIFFERENTIAL - Abnormal; Notable for the  following:    WBC 26.8 (*)  REPEATED TO VERIFY   MCHC 36.1 (*)     Neutrophils Relative 87 (*)     Neutro Abs 23.2 (*)     Lymphocytes Relative 7 (*)     Monocytes Absolute 1.5 (*)     All other components within normal limits  ETHANOL - Abnormal; Notable for the following:    Alcohol, Ethyl (B) 121 (*)     All other components within normal limits  PROTIME-INR  URINALYSIS, ROUTINE W REFLEX MICROSCOPIC  URINE RAPID DRUG SCREEN (HOSP PERFORMED)  URINE CULTURE  CULTURE, RESPIRATORY  MAGNESIUM  PHOSPHORUS  PROCALCITONIN   Ct Head Wo Contrast  04/28/2012  *RADIOLOGY REPORT*  Clinical Data:  Altered mental status.  Found down.  CT HEAD WITHOUT CONTRAST CT CERVICAL SPINE WITHOUT CONTRAST  Technique:  Multidetector CT imaging of the head and cervical spine was performed following  the standard protocol without intravenous contrast.  Multiplanar CT image reconstructions of the cervical spine were also generated.  Comparison:   None  CT HEAD  Findings: There is a large intraparenchymal hemorrhage involving the left frontal region.  This measures 2.8 x 5.6 by 4 cm.  Small left subdural hemorrhage is identified, measuring 8.4 mm maximum diameter.  There is left to right midline shift, measuring 12 mm at its greatest point. Suspect early left uncal herniation. Subarachnoid blood is identified within the right lateral ventricle, occipital horn.  There is blood along the tentorium and along the falx.  There is effacement of the lateral ventricles, left greater than right.  Bone windows show a fracture of the right occipital bone, extending into the skull base and associated significant scalp hematoma.  There is significant opacity throughout the ethmoid and maxillary air cells, consistent with acute / chronic sinusitis.  IMPRESSION:  1.  Significant left frontal lobe intraparenchymal hematoma. 2.  Subdural hematoma involving the left frontal, parietal regions as well as the falx and tentorium. 3.  Subarachnoid blood within the left parietal region and occipital horn of the right lateral ventricle. 4.  Left to right midline shift. 5.  Suspect early uncal herniation.  Critical test results telephoned to Dr. Jeraldine Loots at the time of interpretation on date 04/28/2012 at time 12:12 p.m.  CT CERVICAL SPINE  Findings: Lower right skull base fracture is again identified. This extends from the right occipital region to the foramen magnum. There are degenerative changes throughout the cervical spine.  No evidence for acute cervical spine fracture.  Alignment is normal. The patient has endotracheal tube and nasogastric tube in place. Images of the lung apices are unremarkable.  IMPRESSION:  1.  Significant degenerative changes throughout the cervical spine. 2.  Skull base fracture. 3.  No evidence for acute  fracture of the cervical spine.   Original Report Authenticated By: Norva Pavlov, M.D.    Ct Cervical Spine Wo Contrast  04/28/2012  *RADIOLOGY REPORT*  Clinical Data:  Altered mental status.  Found down.  CT HEAD WITHOUT CONTRAST CT CERVICAL SPINE WITHOUT CONTRAST  Technique:  Multidetector CT imaging of the head and cervical spine was performed following the standard protocol without intravenous contrast.  Multiplanar CT image reconstructions of the cervical spine were also generated.  Comparison:   None  CT HEAD  Findings: There is a large intraparenchymal hemorrhage involving the left frontal region.  This measures 2.8 x 5.6 by 4 cm.  Small left subdural hemorrhage is identified, measuring 8.4 mm maximum diameter.  There is  left to right midline shift, measuring 12 mm at its greatest point. Suspect early left uncal herniation. Subarachnoid blood is identified within the right lateral ventricle, occipital horn.  There is blood along the tentorium and along the falx.  There is effacement of the lateral ventricles, left greater than right.  Bone windows show a fracture of the right occipital bone, extending into the skull base and associated significant scalp hematoma.  There is significant opacity throughout the ethmoid and maxillary air cells, consistent with acute / chronic sinusitis.  IMPRESSION:  1.  Significant left frontal lobe intraparenchymal hematoma. 2.  Subdural hematoma involving the left frontal, parietal regions as well as the falx and tentorium. 3.  Subarachnoid blood within the left parietal region and occipital horn of the right lateral ventricle. 4.  Left to right midline shift. 5.  Suspect early uncal herniation.  Critical test results telephoned to Dr. Jeraldine Loots at the time of interpretation on date 04/28/2012 at time 12:12 p.m.  CT CERVICAL SPINE  Findings: Lower right skull base fracture is again identified. This extends from the right occipital region to the foramen magnum. There are  degenerative changes throughout the cervical spine.  No evidence for acute cervical spine fracture.  Alignment is normal. The patient has endotracheal tube and nasogastric tube in place. Images of the lung apices are unremarkable.  IMPRESSION:  1.  Significant degenerative changes throughout the cervical spine. 2.  Skull base fracture. 3.  No evidence for acute fracture of the cervical spine.   Original Report Authenticated By: Norva Pavlov, M.D.    Dg Chest Port 1 View  04/28/2012  *RADIOLOGY REPORT*  Clinical Data: Post intubation  PORTABLE CHEST - 1 VIEW  Comparison: None.  Findings: Cardiomediastinal silhouette is unremarkable.  NG tube in place with tip in proximal stomach.  Endotracheal tube with tip in upper trachea about 8.2 cm above the carina.  No acute infiltrate or pulmonary edema.  No diagnostic pneumothorax.  IMPRESSION: No acute disease.  Endotracheal tube with tip in upper trachea about 8.2 cm above the carina.  No diagnostic pneumothorax.   Original Report Authenticated By: Natasha Mead, M.D.      No diagnosis found.   Date: 04/28/2012  Rate: 123  Rhythm: atrial fibrillation  QRS Axis: normal  Intervals: afib  ST/T Wave abnormalities: nonspecific ST/T changes  Conduction Disutrbances:left bundle branch block  Narrative Interpretation:   Old EKG Reviewed: none available  ABNORMAL  Cardiac: 130-afib, abnormal  O2- 99% intubated, abnormal  MDM  This 61 year old male presents after being found unresponsive.  On arrival the patient is in extremis, with no significant neurologic activity, and immediate concern for his airway protection.  The patient was intubated immediately after arrival.  Given the unresponsive status there is concern for intracranial process versus metabolic arrangement versus occult trauma.  Following the intubation the patient had CT brain, which was notable for significant hemorrhage, subarachnoid, subdural, intraparenchymal.  I viewed the CAT scan of the  patient was in the radiology suite and to discuss the findings with the radiologist, then with our neurosurgeon.  Given the degree of bleeding the patient was admitted by neurosurgery for further evaluation and management.  I discussed all findings with the patient's family who provided much of the hpi.  INTUBATION Performed by: Gerhard Munch  Required items: required blood products, implants, devices, and special equipment available Patient identity confirmed: provided demographic data and hospital-assigned identification number Time out: Immediately prior to procedure a "time out" was called  to verify the correct patient, procedure, equipment, support staff and site/side marked as required.  Indications: airway protection, unresponsive  Intubation method: Glidescope Laryngoscopy   Preoxygenation: BVM  Sedatives: 20Etomidate Paralytic: 100Succinylcholine  Tube Size: 8 cuffed  Post-procedure assessment: chest rise and ETCO2 monitor Breath sounds: equal and absent over the epigastrium Tube secured with: ETT holder Chest x-ray interpreted by radiologist and me.  Chest x-ray findings: endotracheal tube in appropriate position  Patient tolerated the procedure well with no immediate complications.  CRITICAL CARE Performed by: Gerhard Munch   Total critical care time: 45  Critical care time was exclusive of separately billable procedures and treating other patients.  Critical care was necessary to treat or prevent imminent or life-threatening deterioration.  Critical care was time spent personally by me on the following activities: development of treatment plan with patient and/or surrogate as well as nursing, discussions with consultants, evaluation of patient's response to treatment, examination of patient, obtaining history from patient or surrogate, ordering and performing treatments and interventions, ordering and review of laboratory studies, ordering and review of  radiographic studies, pulse oximetry and re-evaluation of patient's condition.            Gerhard Munch, MD 04/28/12 1704

## 2012-04-28 NOTE — Anesthesia Preprocedure Evaluation (Addendum)
Anesthesia Evaluation  Patient identified by MRN, date of birth, ID band Patient unresponsive    Reviewed: Allergy & Precautions, H&P , Patient's Chart, lab work & pertinent test results  Airway       Dental   Pulmonary          Cardiovascular hypertension, Rhythm:regular Rate:Normal     Neuro/Psych    GI/Hepatic   Endo/Other    Renal/GU      Musculoskeletal   Abdominal   Peds  Hematology   Anesthesia Other Findings Subdural bleed. ETT in place.  Reproductive/Obstetrics                          Anesthesia Physical Anesthesia Plan  ASA: IV  Anesthesia Plan: General   Post-op Pain Management:    Induction: Intravenous  Airway Management Planned:   Additional Equipment:   Intra-op Plan:   Post-operative Plan: Post-operative intubation/ventilation  Informed Consent:   Plan Discussed with: CRNA, Anesthesiologist and Surgeon  Anesthesia Plan Comments:         Anesthesia Quick Evaluation

## 2012-04-28 NOTE — ED Notes (Signed)
Consent obtained for surgery from wife. Dr. Gerlene Fee to call OR team in for procedure. Family updated on plan of care.

## 2012-04-29 ENCOUNTER — Inpatient Hospital Stay (HOSPITAL_COMMUNITY): Payer: 59

## 2012-04-29 ENCOUNTER — Encounter (HOSPITAL_COMMUNITY): Payer: Self-pay | Admitting: Neurosurgery

## 2012-04-29 LAB — BASIC METABOLIC PANEL
CO2: 23 mEq/L (ref 19–32)
Calcium: 7.8 mg/dL — ABNORMAL LOW (ref 8.4–10.5)
Chloride: 103 mEq/L (ref 96–112)
Sodium: 136 mEq/L (ref 135–145)

## 2012-04-29 LAB — GLUCOSE, CAPILLARY
Glucose-Capillary: 118 mg/dL — ABNORMAL HIGH (ref 70–99)
Glucose-Capillary: 121 mg/dL — ABNORMAL HIGH (ref 70–99)

## 2012-04-29 LAB — BLOOD GAS, ARTERIAL
Acid-base deficit: 2.2 mmol/L — ABNORMAL HIGH (ref 0.0–2.0)
Drawn by: 155271
FIO2: 0.4 %
MECHVT: 580 mL
O2 Saturation: 99.3 %
RATE: 14 resp/min
TCO2: 22.5 mmol/L (ref 0–100)
pO2, Arterial: 138 mmHg — ABNORMAL HIGH (ref 80.0–100.0)

## 2012-04-29 LAB — CBC
Platelets: 248 10*3/uL (ref 150–400)
RBC: 3.79 MIL/uL — ABNORMAL LOW (ref 4.22–5.81)
WBC: 10.9 10*3/uL — ABNORMAL HIGH (ref 4.0–10.5)

## 2012-04-29 MED ORDER — LABETALOL HCL 5 MG/ML IV SOLN
10.0000 mg | INTRAVENOUS | Status: DC | PRN
  Administered 2012-04-29: 15 mg via INTRAVENOUS
  Administered 2012-04-29 (×3): 10 mg via INTRAVENOUS
  Administered 2012-04-30 (×5): 20 mg via INTRAVENOUS
  Administered 2012-05-02: 10 mg via INTRAVENOUS
  Administered 2012-05-03: 20 mg via INTRAVENOUS
  Filled 2012-04-29 (×10): qty 4

## 2012-04-29 MED ORDER — ACETAMINOPHEN 160 MG/5ML PO SOLN
650.0000 mg | ORAL | Status: DC | PRN
  Administered 2012-04-29 – 2012-05-02 (×4): 650 mg
  Filled 2012-04-29 (×4): qty 20.3

## 2012-04-29 MED ORDER — PIVOT 1.5 CAL PO LIQD
1000.0000 mL | ORAL | Status: DC
  Administered 2012-04-29 – 2012-04-30 (×2): 1000 mL
  Filled 2012-04-29 (×4): qty 1000

## 2012-04-29 MED ORDER — PRO-STAT SUGAR FREE PO LIQD
30.0000 mL | Freq: Two times a day (BID) | ORAL | Status: DC
  Administered 2012-04-29 – 2012-05-01 (×5): 30 mL
  Filled 2012-04-29 (×6): qty 30

## 2012-04-29 MED ORDER — HYDRALAZINE HCL 20 MG/ML IJ SOLN
5.0000 mg | INTRAMUSCULAR | Status: DC | PRN
  Administered 2012-04-29 – 2012-05-02 (×2): 5 mg via INTRAVENOUS
  Filled 2012-04-29 (×2): qty 1

## 2012-04-29 MED ORDER — LABETALOL HCL 5 MG/ML IV SOLN
INTRAVENOUS | Status: AC
  Administered 2012-04-29: 10 mg via INTRAVENOUS
  Filled 2012-04-29: qty 4

## 2012-04-29 NOTE — Progress Notes (Signed)
eLink Physician-Brief Progress Note Patient Name: Wayne Buckley DOB: 09/04/1950 MRN: [id number]  Date of Service  04/29/2012   HPI/Events of Note   Fever  eICU Interventions  BC X2 Tylenol prn      [name] 04/29/2012, 2:12 AM

## 2012-04-29 NOTE — Progress Notes (Signed)
UR COMPLETED  

## 2012-04-29 NOTE — Progress Notes (Signed)
INITIAL ADULT NUTRITION ASSESSMENT Date: 04/29/2012   Time: 11:54 AM Reason for Assessment: Consult received to initiate and manage enteral nutrition support.  INTERVENTION: Initiate Pivot 1.5 @ 15 ml/hr via OGT and increase by 10 ml every 12 hours to goal rate of 35 ml/hr. 30 ml Prostat BID.  At goal rate, tube feeding regimen will provide 1460 kcal, 108 grams of protein, and 637 ml of H2O.  With current rate of Propofol will provide 1972 kcal (100% of needs) Monitor magnesium, potassium, and phosphorus daily for at least 3 days, MD to replete as needed, as pt is at risk for refeeding syndrome given hx of heavy ETOH abuse.   DOCUMENTATION CODES Per approved criteria  -Not Applicable    ASSESSMENT: Male 61 y.o.  Dx: Altered Mental Status  Hx:  Past Medical History  Diagnosis Date  . Hypertension   History reviewed. No pertinent past surgical history.  Related Meds:  Scheduled Meds:   . sodium chloride  1,000 mL Intravenous Once  . antiseptic oral rinse  15 mL Mouth Rinse QID  .  ceFAZolin (ANCEF) IV  2 g Intravenous Q8H  . chlorhexidine  15 mL Mouth Rinse BID  . folic acid  1 mg Oral Daily  . [COMPLETED] levetiracetam  500 mg Intravenous To OR  . pantoprazole (PROTONIX) IV  40 mg Intravenous QHS  . thiamine  100 mg Intravenous Daily  . [DISCONTINUED] dexmedetomidine  0.4-1.2 mcg/kg/hr Intravenous To OR   Continuous Infusions:   . sodium chloride 75 mL/hr at 04/29/12 0954  . propofol 40 mcg/kg/min (04/29/12 1100)  . [DISCONTINUED] sodium chloride    . [DISCONTINUED] dexmedetomidine     PRN Meds:.sodium chloride, acetaminophen (TYLENOL) oral liquid 160 mg/5 mL, fentaNYL, hydrALAZINE, labetalol, [DISCONTINUED] 0.9 % irrigation (POUR BTL), [DISCONTINUED] bacitracin irrigation, [DISCONTINUED] bacitracin, [DISCONTINUED] hemostatic agents, [DISCONTINUED] lidocaine, [DISCONTINUED] Surgifoam 100 with Thrombin 20,000 units (20 ml) topical solution  Ht: 5\' 10"  (177.8 cm)  Wt:  176 lb 5.9 oz (80 kg)  Ideal Wt: 75.4 kg % Ideal Wt: 106%  Usual Wt:  Wt Readings from Last 10 Encounters:  04/28/12 176 lb 5.9 oz (80 kg)  04/28/12 176 lb 5.9 oz (80 kg)   % Usual Wt: -  Body mass index is 25.31 kg/(m^2). Overweight  Food/Nutrition Related Hx: heavy ETOH abuse  Labs:  CMP     Component Value Date/Time   NA 136 04/29/2012 0430   K 3.6 04/29/2012 0430   CL 103 04/29/2012 0430   CO2 23 04/29/2012 0430   GLUCOSE 129* 04/29/2012 0430   BUN 9 04/29/2012 0430   CREATININE 0.69 04/29/2012 0430   CALCIUM 7.8* 04/29/2012 0430   PROT 7.9 04/28/2012 1105   ALBUMIN 4.3 04/28/2012 1105   AST 88* 04/28/2012 1105   ALT 42 04/28/2012 1105   ALKPHOS 60 04/28/2012 1105   BILITOT 0.3 04/28/2012 1105   GFRNONAA >90 04/29/2012 0430   GFRAA >90 04/29/2012 0430   CBG (last 3)   Basename 04/29/12 0800 04/28/12 2320 04/28/12 1753  GLUCAP 124* 123* 107*   Sodium  Date/Time Value Range Status  04/29/2012  4:30 AM 136  135 - 145 mEq/L Final  04/28/2012  4:22 PM 134* 135 - 145 mEq/L Final  04/28/2012 11:05 AM 130* 135 - 145 mEq/L Final    Potassium  Date/Time Value Range Status  04/29/2012  4:30 AM 3.6  3.5 - 5.1 mEq/L Final  04/28/2012  4:22 PM 3.4* 3.5 - 5.1 mEq/L Final  04/28/2012 11:05  AM 4.6  3.5 - 5.1 mEq/L Final     HEMOLYSIS AT THIS LEVEL MAY AFFECT RESULT    Phosphorus  Date/Time Value Range Status  04/28/2012 12:44 PM 3.4  2.3 - 4.6 mg/dL Final    Magnesium  Date/Time Value Range Status  04/28/2012 12:44 PM 2.2  1.5 - 2.5 mg/dL Final    Intake/Output Summary (Last 24 hours) at 04/29/12 1157 Last data filed at 04/29/12 1100  Gross per 24 hour  Intake 5155.3 ml  Output   4565 ml  Net  590.3 ml   Last BM: PTA  Diet Order: NPO  Supplements/Tube Feeding: none  IVF:    sodium chloride Last Rate: 75 mL/hr at 04/29/12 0954  propofol Last Rate: 40 mcg/kg/min (04/29/12 1100)  [DISCONTINUED] sodium chloride   [DISCONTINUED] dexmedetomidine     Pt with hx of heavy ETOH abuse, Alcohol 121 on admission. Pt found down by wife outside after out most of the night drinking. Per H&P, pt drinks daily with heavy drinking on the weekend.   Patient is currently intubated on ventilator support for airway protection. Pt is POD #1 for craniotomy, evacuation of SDH, evacuation of L frontal hemorrhage and partial L frontal lobectomy.  MV: 8.9 Temp:Temp (24hrs), Avg:100.6 F (38.1 C), Min:97.7 F (36.5 C), Max:101.8 F (38.8 C)  Propofol: 19.2 ml/hr providing: 506 kcal/day   Estimated Nutritional Needs:   Kcal:  1972 Protein:  96-120 grams Fluid:  >2 L/day  NUTRITION DIAGNOSIS: Inadequate oral intake r/t inability to eat AEB NPO status.  MONITORING/EVALUATION(Goals): Goal: Pt to meet >/= 90% of their estimated nutrition needs. Monitor: vent status, TF tolerance, weight, labs  EDUCATION NEEDS: -No education needs identified at this time  Kendell Bane RD, LDN, CNSC [phone] Pager [phone] After Hours Pager  04/29/2012, 11:54 AM

## 2012-04-29 NOTE — Progress Notes (Signed)
Chaplain visited patient at the ED Trauma C after receiving a page from the ED secretary. Patient was unresponsive and intubated. Chaplain made contact with wife and daughter of patient. Chaplain escorted family to consultation room B to meet with physician. Chaplain served as Print production planner between ED staff and family. Chaplain provided ministry of presence and shared words of comfort, hope and encouragement with family while patient was in the OR. Chaplain also provided ministry of empathic listening to family until friends and Elesa Hacker members arrived. Patient was later admitted in room 3104 Family members expressed appreciation for Chaplain's care, support and visit.

## 2012-04-29 NOTE — Progress Notes (Signed)
Patient ID: DOCTOR SHEAHAN, male   DOB: 05-02-1951, 61 y.o.   MRN: [id number] Subjective: Patient reports sedated on propofol  Objective: Vital signs in last 24 hours: Temp:  [93.4 F (34.1 C)-101.8 F (38.8 C)] 100.4 F (38 C) (11/11 0700) Pulse Rate:  [52-121] 110  (11/11 0743) Resp:  [14-20] 19  (11/11 0743) BP: (95-213)/(60-108) 171/72 mmHg (11/11 0743) SpO2:  [92 %-100 %] 100 % (11/11 0743) Arterial Line BP: (96-154)/(53-79) 154/62 mmHg (11/11 0700) FiO2 (%):  [40 %-100 %] 40 % (11/11 0743) Weight:  [80 kg (176 lb 5.9 oz)] 80 kg (176 lb 5.9 oz) (11/10 1900)  Intake/Output from previous day: 11/10 0701 - 11/11 0700 In: 4803.7 [I.V.:4228.7; IV Piggyback:300] Out: 3895 [Urine:3560; Emesis/NG output:60; Drains:50; Blood:225] Intake/Output this shift: Total I/O In: 82.2 [I.V.:82.2] Out: -   sedated now, but nurse reports spontaneous eye opening, and some localizing, left greater than right  Lab Results:  Basename 04/29/12 0430 04/28/12 1622 04/28/12 1105  WBC 10.9* -- 26.8*  HGB 11.6* 12.6* --  HCT 33.5* 37.0* --  PLT 248 -- 375   BMET  Basename 04/29/12 0430 04/28/12 1622 04/28/12 1105  NA 136 134* --  K 3.6 3.4* --  CL 103 -- 92*  CO2 23 -- 19  GLUCOSE 129* 110* --  BUN 9 -- 10  CREATININE 0.69 -- 0.56  CALCIUM 7.8* -- 8.6    Studies/Results: Ct Head Wo Contrast  04/29/2012  *RADIOLOGY REPORT*  Clinical Data: Follow-up hemorrhage.  CT HEAD WITHOUT CONTRAST  Technique:  Contiguous axial images were obtained from the base of the skull through the vertex without contrast.  Comparison: CT head without contrast 04/28/2012.  Findings: The patient is now status post left frontal craniotomy for evacuation of the hemorrhage.  The left frontal lobe parenchymal hemorrhage is largely evacuated. Midline shift is markedly reduced, now measuring 3 mm at the foramen of [place]ro.  The ventricular effacement is partially resolved.  There is some residual parenchymal hemorrhage,  significantly smaller than previously seen.  The widest component is only 6 mm.  A small amount of subdural blood is again noted along the right side of the falx anteriorly.  A small amount of extra-axial fluid is present over the frontal convexity bilaterally.  Pneumocephalus is evident subjacent to the craniotomy.  Areas of subarachnoid hemorrhage are more apparent over the right frontal convexity than on the prior exam.  There was at least some subarachnoid blood on the prior exam.  Blood is again noted within the dependent portions of the ventricles.  An extra-axial hemorrhage in the left middle cranial fossa persists without change, measuring 19 mm in long axis.  No new hemorrhage is present.  No acute cortical infarct is evident.  The right occipital and parietal fracture is stable.  No additional fractures are evident.  A fluid level is present in the left maxillary sinus.  Scattered ethmoid opacification is present bilaterally.  Mastoid air cells are clear.  Atherosclerotic calcifications are again noted within the cavernous carotid arteries.  IMPRESSION:  1.  Interval evacuation of the left frontal hematoma. 2.  Significant reduction in mass effect and midline shift, measuring approximately 3 mm. 3.  Pneumocephalus within the surgical cavity and subjacent to the craniotomy. 4.  Persistent subdural and subarachnoid blood over the right frontal convexity. 5.  Stable appearance of the right occipital and parietal fracture.   Original Report Authenticated By: Marin Roberts, M.D.    Ct Head Wo Contrast  04/28/2012  *  RADIOLOGY REPORT*  Clinical Data:  Altered mental status.  Found down.  CT HEAD WITHOUT CONTRAST CT CERVICAL SPINE WITHOUT CONTRAST  Technique:  Multidetector CT imaging of the head and cervical spine was performed following the standard protocol without intravenous contrast.  Multiplanar CT image reconstructions of the cervical spine were also generated.  Comparison:   None  CT HEAD   Findings: There is a large intraparenchymal hemorrhage involving the left frontal region.  This measures 2.8 x 5.6 by 4 cm.  Small left subdural hemorrhage is identified, measuring 8.4 mm maximum diameter.  There is left to right midline shift, measuring 12 mm at its greatest point. Suspect early left uncal herniation. Subarachnoid blood is identified within the right lateral ventricle, occipital horn.  There is blood along the tentorium and along the falx.  There is effacement of the lateral ventricles, left greater than right.  Bone windows show a fracture of the right occipital bone, extending into the skull base and associated significant scalp hematoma.  There is significant opacity throughout the ethmoid and maxillary air cells, consistent with acute / chronic sinusitis.  IMPRESSION:  1.  Significant left frontal lobe intraparenchymal hematoma. 2.  Subdural hematoma involving the left frontal, parietal regions as well as the falx and tentorium. 3.  Subarachnoid blood within the left parietal region and occipital horn of the right lateral ventricle. 4.  Left to right midline shift. 5.  Suspect early uncal herniation.  Critical test results telephoned to Dr. Jeraldine Loots at the time of interpretation on date 04/28/2012 at time 12:12 p.m.  CT CERVICAL SPINE  Findings: Lower right skull base fracture is again identified. This extends from the right occipital region to the foramen magnum. There are degenerative changes throughout the cervical spine.  No evidence for acute cervical spine fracture.  Alignment is normal. The patient has endotracheal tube and nasogastric tube in place. Images of the lung apices are unremarkable.  IMPRESSION:  1.  Significant degenerative changes throughout the cervical spine. 2.  Skull base fracture. 3.  No evidence for acute fracture of the cervical spine.   Original Report Authenticated By: Norva Pavlov, M.D.    Ct Cervical Spine Wo Contrast  04/28/2012  *RADIOLOGY REPORT*   Clinical Data:  Altered mental status.  Found down.  CT HEAD WITHOUT CONTRAST CT CERVICAL SPINE WITHOUT CONTRAST  Technique:  Multidetector CT imaging of the head and cervical spine was performed following the standard protocol without intravenous contrast.  Multiplanar CT image reconstructions of the cervical spine were also generated.  Comparison:   None  CT HEAD  Findings: There is a large intraparenchymal hemorrhage involving the left frontal region.  This measures 2.8 x 5.6 by 4 cm.  Small left subdural hemorrhage is identified, measuring 8.4 mm maximum diameter.  There is left to right midline shift, measuring 12 mm at its greatest point. Suspect early left uncal herniation. Subarachnoid blood is identified within the right lateral ventricle, occipital horn.  There is blood along the tentorium and along the falx.  There is effacement of the lateral ventricles, left greater than right.  Bone windows show a fracture of the right occipital bone, extending into the skull base and associated significant scalp hematoma.  There is significant opacity throughout the ethmoid and maxillary air cells, consistent with acute / chronic sinusitis.  IMPRESSION:  1.  Significant left frontal lobe intraparenchymal hematoma. 2.  Subdural hematoma involving the left frontal, parietal regions as well as the falx and tentorium. 3.  Subarachnoid blood within the left parietal region and occipital horn of the right lateral ventricle. 4.  Left to right midline shift. 5.  Suspect early uncal herniation.  Critical test results telephoned to Dr. Jeraldine Loots at the time of interpretation on date 04/28/2012 at time 12:12 p.m.  CT CERVICAL SPINE  Findings: Lower right skull base fracture is again identified. This extends from the right occipital region to the foramen magnum. There are degenerative changes throughout the cervical spine.  No evidence for acute cervical spine fracture.  Alignment is normal. The patient has endotracheal tube and  nasogastric tube in place. Images of the lung apices are unremarkable.  IMPRESSION:  1.  Significant degenerative changes throughout the cervical spine. 2.  Skull base fracture. 3.  No evidence for acute fracture of the cervical spine.   Original Report Authenticated By: Norva Pavlov, M.D.    Dg Chest Port 1 View  04/29/2012  *RADIOLOGY REPORT*  Clinical Data: Tube placement.  PORTABLE CHEST - 1 VIEW  Comparison: 04/28/2012  Findings: Endotracheal tube has been advanced.  It is difficult to visualize the carina.  I suspect the tip of endotracheal tube is approximately 2-3 cm above the carina.  NG tube is in the stomach. Slight elevation of the left hemidiaphragm, new since prior study. Minimal left base atelectasis.  Right lung is clear.  IMPRESSION: Difficult to visualize the carina.  Endotracheal tube tip now appears to be approximately 2-3 cm above the carina.  Slight elevation of the left hemidiaphragm with left base atelectasis.   Original Report Authenticated By: Charlett Nose, M.D.    Dg Chest Port 1 View  04/28/2012  *RADIOLOGY REPORT*  Clinical Data: Post intubation  PORTABLE CHEST - 1 VIEW  Comparison: None.  Findings: Cardiomediastinal silhouette is unremarkable.  NG tube in place with tip in proximal stomach.  Endotracheal tube with tip in upper trachea about 8.2 cm above the carina.  No acute infiltrate or pulmonary edema.  No diagnostic pneumothorax.  IMPRESSION: No acute disease.  Endotracheal tube with tip in upper trachea about 8.2 cm above the carina.  No diagnostic pneumothorax.   Original Report Authenticated By: Natasha Mead, M.D.     Assessment/Plan: CT today looks excellent with marked decrease in mass effect. He is showing some neuro improvement. Should definitely go slow regarding extubation, as he would most likely have a lot of difficulty maintaining an airway in this state.  LOS: 1 day  as above   Reinaldo Meeker, MD 04/29/2012, 8:55 AM

## 2012-04-29 NOTE — Progress Notes (Addendum)
PULMONARY  / CRITICAL CARE MEDICINE  Name: Wayne Buckley MRN: [id number] DOB: Feb 21, 1951    LOS: 1  REFERRING MD :  Jeraldine Loots (EDP)   CHIEF COMPLAINT:  AMS, vent     HISTORY OF PRESENT ILLNESS:  61 yo male with hx HTN, heavy ETOH presented 11/10 after being found unresponsive outside by wife. Unknown downtime - wife heard a sound about an hour before finding him.  Pt was out most of the night drinking (drinks daily, heavily on the weekends, unsure if he has even had withdrawal as he has never has a period of time without drinking) and when wife awoke in the am he was lying unresponsive on his back on the deck.  Wife started CPR although she was unsure if he was pulseless, on EMS arrival he had strong pulse and agonal respirations.  Did have laceration/hematoma to back of head and some cuts/scrapes on arms and legs.  Prior to admit was being treated for a "head cold" with amoxicillin but had been feeling better.  Poor GCS on arrival, intubated, se ETOH 121   BRIEF PATIENT DESCRIPTION:  61 yo male with hx HTN admitted 11/10 with AMS and SDH after fall.  Intubated for airway protection.    LEVEL OF CARE:  ICU PRIMARY SERVICE:  PCCM CONSULTANTS:  Neurosurgery  CODE STATUS: full DIET:  Npo  DVT Px:  SCD's  GI Px:  protonix    LINES / TUBES: ETT 11/10>>>  CULTURES: none  ANTIBIOTICS: none  SIGNIFICANT EVENTS:  CT head 11/10>>> 1. Significant left frontal lobe intraparenchymal hematoma. 2. Subdural hematoma involving the left frontal, parietal regions as well as the falx and tentorium. 3. Subarachnoid blood within the left parietal region and occipital horn of the right lateral ventricle. 4. Left to right midline shift. 5. Suspect early uncal herniation.   INTERVAL HISTORY:   04/29/12: Agitatedon diprivan with high BP. Surgery resulted in good hematoma evacuation. Niece at bedside  VITAL SIGNS: Temp:  [93.4 F (34.1 C)-101.8 F (38.8 C)] 100.6 F (38.1 C) (11/11 0900) Pulse  Rate:  [52-121] 91  (11/11 0900) Resp:  [14-20] 14  (11/11 0900) BP: (95-213)/(60-108) 139/76 mmHg (11/11 0900) SpO2:  [92 %-100 %] 100 % (11/11 0900) Arterial Line BP: (96-169)/(53-79) 169/66 mmHg (11/11 0900) FiO2 (%):  [40 %-100 %] 40 % (11/11 0743) Weight:  [80 kg (176 lb 5.9 oz)] 80 kg (176 lb 5.9 oz) (11/10 1900)   PHYSICAL EXAMINATION: General:  wdwn male, NAD on vent in ER Neuro:  Sedated on propofol, Aigated on diprivan,  HEENT:  Mm dry, no JVD, c-collar Cardiovascular:  s1s2 irreg Lungs:  resps even non labored on vent, cta  Abdomen:  Soft, +bs Ext: warm and dry, no edema   Ct Head Wo Contrast  04/29/2012  *RADIOLOGY REPORT*  Clinical Data: Follow-up hemorrhage.  CT HEAD WITHOUT CONTRAST  Technique:  Contiguous axial images were obtained from the base of the skull through the vertex without contrast.  Comparison: CT head without contrast 04/28/2012.  Findings: The patient is now status post left frontal craniotomy for evacuation of the hemorrhage.  The left frontal lobe parenchymal hemorrhage is largely evacuated. Midline shift is markedly reduced, now measuring 3 mm at the foramen of Monro.  The ventricular effacement is partially resolved.  There is some residual parenchymal hemorrhage, significantly smaller than previously seen.  The widest component is only 6 mm.  A small amount of subdural blood is again noted along the right side  of the falx anteriorly.  A small amount of extra-axial fluid is present over the frontal convexity bilaterally.  Pneumocephalus is evident subjacent to the craniotomy.  Areas of subarachnoid hemorrhage are more apparent over the right frontal convexity than on the prior exam.  There was at least some subarachnoid blood on the prior exam.  Blood is again noted within the dependent portions of the ventricles.  An extra-axial hemorrhage in the left middle cranial fossa persists without change, measuring 19 mm in long axis.  No new hemorrhage is present.  No  acute cortical infarct is evident.  The right occipital and parietal fracture is stable.  No additional fractures are evident.  A fluid level is present in the left maxillary sinus.  Scattered ethmoid opacification is present bilaterally.  Mastoid air cells are clear.  Atherosclerotic calcifications are again noted within the cavernous carotid arteries.  IMPRESSION:  1.  Interval evacuation of the left frontal hematoma. 2.  Significant reduction in mass effect and midline shift, measuring approximately 3 mm. 3.  Pneumocephalus within the surgical cavity and subjacent to the craniotomy. 4.  Persistent subdural and subarachnoid blood over the right frontal convexity. 5.  Stable appearance of the right occipital and parietal fracture.   Original Report Authenticated By: Marin Roberts, M.D.    Ct Head Wo Contrast  04/28/2012  *RADIOLOGY REPORT*  Clinical Data:  Altered mental status.  Found down.  CT HEAD WITHOUT CONTRAST CT CERVICAL SPINE WITHOUT CONTRAST  Technique:  Multidetector CT imaging of the head and cervical spine was performed following the standard protocol without intravenous contrast.  Multiplanar CT image reconstructions of the cervical spine were also generated.  Comparison:   None  CT HEAD  Findings: There is a large intraparenchymal hemorrhage involving the left frontal region.  This measures 2.8 x 5.6 by 4 cm.  Small left subdural hemorrhage is identified, measuring 8.4 mm maximum diameter.  There is left to right midline shift, measuring 12 mm at its greatest point. Suspect early left uncal herniation. Subarachnoid blood is identified within the right lateral ventricle, occipital horn.  There is blood along the tentorium and along the falx.  There is effacement of the lateral ventricles, left greater than right.  Bone windows show a fracture of the right occipital bone, extending into the skull base and associated significant scalp hematoma.  There is significant opacity throughout the  ethmoid and maxillary air cells, consistent with acute / chronic sinusitis.  IMPRESSION:  1.  Significant left frontal lobe intraparenchymal hematoma. 2.  Subdural hematoma involving the left frontal, parietal regions as well as the falx and tentorium. 3.  Subarachnoid blood within the left parietal region and occipital horn of the right lateral ventricle. 4.  Left to right midline shift. 5.  Suspect early uncal herniation.  Critical test results telephoned to Dr. Jeraldine Loots at the time of interpretation on date 04/28/2012 at time 12:12 p.m.  CT CERVICAL SPINE  Findings: Lower right skull base fracture is again identified. This extends from the right occipital region to the foramen magnum. There are degenerative changes throughout the cervical spine.  No evidence for acute cervical spine fracture.  Alignment is normal. The patient has endotracheal tube and nasogastric tube in place. Images of the lung apices are unremarkable.  IMPRESSION:  1.  Significant degenerative changes throughout the cervical spine. 2.  Skull base fracture. 3.  No evidence for acute fracture of the cervical spine.   Original Report Authenticated By: Norva Pavlov, M.D.  Ct Cervical Spine Wo Contrast  04/28/2012  *RADIOLOGY REPORT*  Clinical Data:  Altered mental status.  Found down.  CT HEAD WITHOUT CONTRAST CT CERVICAL SPINE WITHOUT CONTRAST  Technique:  Multidetector CT imaging of the head and cervical spine was performed following the standard protocol without intravenous contrast.  Multiplanar CT image reconstructions of the cervical spine were also generated.  Comparison:   None  CT HEAD  Findings: There is a large intraparenchymal hemorrhage involving the left frontal region.  This measures 2.8 x 5.6 by 4 cm.  Small left subdural hemorrhage is identified, measuring 8.4 mm maximum diameter.  There is left to right midline shift, measuring 12 mm at its greatest point. Suspect early left uncal herniation. Subarachnoid blood is  identified within the right lateral ventricle, occipital horn.  There is blood along the tentorium and along the falx.  There is effacement of the lateral ventricles, left greater than right.  Bone windows show a fracture of the right occipital bone, extending into the skull base and associated significant scalp hematoma.  There is significant opacity throughout the ethmoid and maxillary air cells, consistent with acute / chronic sinusitis.  IMPRESSION:  1.  Significant left frontal lobe intraparenchymal hematoma. 2.  Subdural hematoma involving the left frontal, parietal regions as well as the falx and tentorium. 3.  Subarachnoid blood within the left parietal region and occipital horn of the right lateral ventricle. 4.  Left to right midline shift. 5.  Suspect early uncal herniation.  Critical test results telephoned to Dr. Jeraldine Loots at the time of interpretation on date 04/28/2012 at time 12:12 p.m.  CT CERVICAL SPINE  Findings: Lower right skull base fracture is again identified. This extends from the right occipital region to the foramen magnum. There are degenerative changes throughout the cervical spine.  No evidence for acute cervical spine fracture.  Alignment is normal. The patient has endotracheal tube and nasogastric tube in place. Images of the lung apices are unremarkable.  IMPRESSION:  1.  Significant degenerative changes throughout the cervical spine. 2.  Skull base fracture. 3.  No evidence for acute fracture of the cervical spine.   Original Report Authenticated By: Norva Pavlov, M.D.    Dg Chest Port 1 View  04/29/2012  *RADIOLOGY REPORT*  Clinical Data: Tube placement.  PORTABLE CHEST - 1 VIEW  Comparison: 04/28/2012  Findings: Endotracheal tube has been advanced.  It is difficult to visualize the carina.  I suspect the tip of endotracheal tube is approximately 2-3 cm above the carina.  NG tube is in the stomach. Slight elevation of the left hemidiaphragm, new since prior study. Minimal  left base atelectasis.  Right lung is clear.  IMPRESSION: Difficult to visualize the carina.  Endotracheal tube tip now appears to be approximately 2-3 cm above the carina.  Slight elevation of the left hemidiaphragm with left base atelectasis.   Original Report Authenticated By: Charlett Nose, M.D.    Dg Chest Port 1 View  04/28/2012  *RADIOLOGY REPORT*  Clinical Data: Post intubation  PORTABLE CHEST - 1 VIEW  Comparison: None.  Findings: Cardiomediastinal silhouette is unremarkable.  NG tube in place with tip in proximal stomach.  Endotracheal tube with tip in upper trachea about 8.2 cm above the carina.  No acute infiltrate or pulmonary edema.  No diagnostic pneumothorax.  IMPRESSION: No acute disease.  Endotracheal tube with tip in upper trachea about 8.2 cm above the carina.  No diagnostic pneumothorax.   Original Report Authenticated By: Natasha Mead,  M.D.      DIAGNOSES: Active Problems:  Acute respiratory failure  Subdural hematoma, post-traumatic   ASSESSMENT / PLAN: PULMONARY  Lab 04/29/12 0453 04/28/12 1258  PHART 7.429 7.277*  PCO2ART 33.0* 41.6  PO2ART 138.0* 50.0*  HCO3 21.5 19.4*  O2SAT 99.3 80.0    Ventilator Settings: Vent Mode:  [-] PRVC FiO2 (%):  [40 %-100 %] 40 % Set Rate:  [14 bmp-18 bmp] 14 bmp Vt Set:  [580 mL] 580 mL PEEP:  [5 cmH20] 5 cmH20 Plateau Pressure:  [6 cmH20-15 cmH20] 14 cmH20 CXR:  11/10>> clear, ETT high - insert 3-4  ASSESSMENT: Acute VDRF - r/t AMS/neuro event  On 04/29/2012: does not meet SBT criteria due to cns issues   PLAN:   Cont vent support, 8cc/kg  F/u CXR   CARDIOVASCULAR  Lab 04/28/12 1106  TROPONINI --  CKTOTAL --  CKMB --  LATICACIDVEN 5.7*  PROBNP --  O2SATVEN --    ASSESSMENT:  Hx HTN  ?Afib   PLAN:  F/u card panel  Recheck lactic acid   RENAL  Lab 04/29/12 0430 04/28/12 1622 04/28/12 1244 04/28/12 1105 04/28/12 1057  NA 136 134* -- 130* --  K 3.6 3.4* -- 4.6 --  CL 103 -- -- 92* 100  CO2 23 -- -- 19  --  BUN 9 -- -- 10 11  CREATININE 0.69 -- -- 0.56 1.00  CALCIUM 7.8* -- -- 8.6 --  MG -- -- 2.2 -- --  PHOS -- -- 3.4 -- --   Intake/Output      11/10 0701 - 11/11 0700 11/11 0701 - 11/12 0700   I.V. (mL/kg) 4228.7 (52.9) 166.8 (2.1)   Other 275    IV Piggyback 300    Total Intake(mL/kg) 4803.7 (60) 166.8 (2.1)   Urine (mL/kg/hr) 3560 (1.9) 450   Emesis/NG output 60 190   Drains 50    Blood 225    Total Output 3895 640   Net +908.7 -473.2          ASSESSMENT:   Hyponatremia  Lactic acidosis    - on 04/29/12: hyponatremia improving.   PLAN:   F/u chem  F/u lactate    GASTROINTESTINAL  Lab 04/28/12 1105  AST 88*  ALT 42  ALKPHOS 60  PROT 7.9  ALBUMIN 4.3    ASSESSMENT:   Hx ETOH -se ETOH 121 UDS- THC + 04/29/12 - at risk for etoh wd  PLAN:   Int f/u LFTS   HEMATOLOGIC  Lab 04/29/12 0430 04/28/12 1622 04/28/12 1105  HGB 11.6* 12.6* 15.9  HCT 33.5* 37.0* 44.0  PLT 248 -- 375  INR -- -- 1.06  APTT -- -- --    ASSESSMENT:   No acute issue  PLAN:  SCD's for dvt proph in setting SDH  - PRBC for hgb </= 6.9gm%    - exceptions are   -  if ACS susepcted/confirmed then transfuse for hgb </= 8.0gm%,  or    -  If septic shock first 24h and scvo2 < 70% then transfuse for hgb </= 9.0gm%   - active bleeding with hemodynamic instability, then transfuse regardless of hemoglobin value   At at all times try to transfuse 1 unit prbc as possible with exception of active hemorrhage    INFECTIOUS  Lab 04/29/12 0430 04/28/12 1244 04/28/12 1105  PROCALCITON -- <0.10 --  WBC 10.9* -- 26.8*    ASSESSMENT:   Leukocytosis - unknown etiology. CXR ess clear.   Recent "cold"  on current abx    - 04/29/12 - febrile  PLAN:   Urine, sputum culture  Hold off abx (s/p pre and post op abx) du eto normal PCT  monitor      ENDOCRINE CBG  Lab 04/28/12 2320 04/28/12 1753  GLUCAP 123* 107*    ASSESSMENT:   No active issue   PLAN:   Monitor blood sugar on  chem   NEUROLOGIC  ASSESSMENT:   AMS  Fall  SDH -- extensive with L->R shift    - 04/29/12: CT shows improvement. Exam improved but agitated. AT risk for etoh wd  PLAN:   Sedation with driprivan IF still agitated, use precedex  Both can control etoh wd   CRITICAL CARE: The patient is critically ill with multiple organ systems failure and requires high complexity decision making for assessment and support, frequent evaluation and titration of therapies, application of advanced monitoring technologies and extensive interpretation of multiple databases. Critical Care Time devoted to patient care services described in this note is 35  minutes.    Dr. Kalman Shan, M.D., [facility].C.P Pulmonary and Critical Care Medicine Staff Physician [facility]  Pulmonary and Critical Care Pager: [phone], If no answer or between  15:00h - 7:00h: call [phone]  04/29/2012 9:54 AM

## 2012-04-30 ENCOUNTER — Inpatient Hospital Stay (HOSPITAL_COMMUNITY): Payer: 59

## 2012-04-30 LAB — URINE CULTURE
Colony Count: NO GROWTH
Culture: NO GROWTH

## 2012-04-30 LAB — GLUCOSE, CAPILLARY
Glucose-Capillary: 126 mg/dL — ABNORMAL HIGH (ref 70–99)
Glucose-Capillary: 116 mg/dL — ABNORMAL HIGH (ref 70–99)
Glucose-Capillary: 114 mg/dL — ABNORMAL HIGH (ref 70–99)

## 2012-04-30 LAB — CBC
HCT: 29.2 % — ABNORMAL LOW (ref 39.0–52.0)
MCV: 89.6 fL (ref 78.0–100.0)
RBC: 3.26 MIL/uL — ABNORMAL LOW (ref 4.22–5.81)
RDW: 13.5 % (ref 11.5–15.5)
WBC: 11.5 10*3/uL — ABNORMAL HIGH (ref 4.0–10.5)

## 2012-04-30 LAB — BASIC METABOLIC PANEL
BUN: 9 mg/dL (ref 6–23)
CO2: 24 mEq/L (ref 19–32)
Chloride: 106 mEq/L (ref 96–112)
Creatinine, Ser: 0.49 mg/dL — ABNORMAL LOW (ref 0.50–1.35)
GFR calc Af Amer: >90 mL/min (ref >90)
Potassium: 3.4 mEq/L — ABNORMAL LOW (ref 3.5–5.1)

## 2012-04-30 LAB — PROCALCITONIN: Procalcitonin: 0.53 ng/mL

## 2012-04-30 LAB — MAGNESIUM: Magnesium: 2.3 mg/dL (ref 1.5–2.5)

## 2012-04-30 MED ORDER — PANTOPRAZOLE SODIUM 40 MG PO PACK
40.0000 mg | PACK | Freq: Every day | ORAL | Status: DC
  Administered 2012-04-30 – 2012-05-01 (×2): 40 mg
  Filled 2012-04-30 (×4): qty 20

## 2012-04-30 MED ORDER — POTASSIUM PHOSPHATE DIBASIC 3 MMOLE/ML IV SOLN
30.0000 mmol | Freq: Once | INTRAVENOUS | Status: AC
  Administered 2012-04-30: 30 mmol via INTRAVENOUS
  Filled 2012-04-30: qty 10

## 2012-04-30 MED ORDER — PIPERACILLIN-TAZOBACTAM 3.375 G IVPB
3.3750 g | Freq: Three times a day (TID) | INTRAVENOUS | Status: DC
  Administered 2012-04-30 – 2012-05-04 (×12): 3.375 g via INTRAVENOUS
  Filled 2012-04-30 (×15): qty 50

## 2012-04-30 MED ORDER — SODIUM CHLORIDE 0.9 % IJ SOLN
10.0000 mL | Freq: Two times a day (BID) | INTRAMUSCULAR | Status: DC
  Administered 2012-04-30 – 2012-05-01 (×2): 10 mL
  Administered 2012-05-02: 20 mL
  Administered 2012-05-02 – 2012-05-07 (×8): 10 mL

## 2012-04-30 MED ORDER — SODIUM CHLORIDE 0.9 % IJ SOLN
10.0000 mL | INTRAMUSCULAR | Status: DC | PRN
  Administered 2012-05-03: 10 mL

## 2012-04-30 NOTE — Progress Notes (Signed)
Pt placed back on full support at this time due to increased BP and agitation. Pt started on pressors and sedation.

## 2012-04-30 NOTE — Progress Notes (Signed)
Patient ID: Wayne Buckley, male   DOB: 06-06-1951, 61 y.o.   MRN: [id number] Subjective: Patient reports intubated  Objective: Vital signs in last 24 hours: Temp:  [97.5 F (36.4 C)-101.5 F (38.6 C)] 100.2 F (37.9 C) (11/12 0900) Pulse Rate:  [70-99] 77  (11/12 0900) Resp:  [14-27] 18  (11/12 0900) BP: (90-188)/(61-98) 150/74 mmHg (11/12 0900) SpO2:  [98 %-100 %] 99 % (11/12 0900) Arterial Line BP: (127-182)/(54-68) 177/65 mmHg (11/12 0900) FiO2 (%):  [40 %] 40 % (11/12 0748)  Intake/Output from previous day: 11/11 0701 - 11/12 0700 In: 2287 [I.V.:2227; IV Piggyback:50] Out: 1920 [Urine:1670; Emesis/NG output:190; Drains:60] Intake/Output this shift: Total I/O In: 258 [I.V.:198; Other:60] Out: 140 [Urine:140]  eyes closed, purposefull on left, minimal movement on right. Nurse does report some eye opening  Lab Results:  Basename 04/30/12 0450 04/29/12 0430  WBC 11.5* 10.9*  HGB 9.9* 11.6*  HCT 29.2* 33.5*  PLT 204 248   BMET  Basename 04/30/12 0450 04/29/12 0430  NA 137 136  K 3.4* 3.6  CL 106 103  CO2 24 23  GLUCOSE 133* 129*  BUN 9 9  CREATININE 0.49* 0.69  CALCIUM 8.1* 7.8*    Studies/Results: Ct Head Wo Contrast  04/29/2012  *RADIOLOGY REPORT*  Clinical Data: Follow-up hemorrhage.  CT HEAD WITHOUT CONTRAST  Technique:  Contiguous axial images were obtained from the base of the skull through the vertex without contrast.  Comparison: CT head without contrast 04/28/2012.  Findings: The patient is now status post left frontal craniotomy for evacuation of the hemorrhage.  The left frontal lobe parenchymal hemorrhage is largely evacuated. Midline shift is markedly reduced, now measuring 3 mm at the foramen of Monro.  The ventricular effacement is partially resolved.  There is some residual parenchymal hemorrhage, significantly smaller than previously seen.  The widest component is only 6 mm.  A small amount of subdural blood is again noted along the right side of the  falx anteriorly.  A small amount of extra-axial fluid is present over the frontal convexity bilaterally.  Pneumocephalus is evident subjacent to the craniotomy.  Areas of subarachnoid hemorrhage are more apparent over the right frontal convexity than on the prior exam.  There was at least some subarachnoid blood on the prior exam.  Blood is again noted within the dependent portions of the ventricles.  An extra-axial hemorrhage in the left middle cranial fossa persists without change, measuring 19 mm in long axis.  No new hemorrhage is present.  No acute cortical infarct is evident.  The right occipital and parietal fracture is stable.  No additional fractures are evident.  A fluid level is present in the left maxillary sinus.  Scattered ethmoid opacification is present bilaterally.  Mastoid air cells are clear.  Atherosclerotic calcifications are again noted within the cavernous carotid arteries.  IMPRESSION:  1.  Interval evacuation of the left frontal hematoma. 2.  Significant reduction in mass effect and midline shift, measuring approximately 3 mm. 3.  Pneumocephalus within the surgical cavity and subjacent to the craniotomy. 4.  Persistent subdural and subarachnoid blood over the right frontal convexity. 5.  Stable appearance of the right occipital and parietal fracture.   Original Report Authenticated By: Marin Roberts, M.D.    Ct Head Wo Contrast  04/28/2012  *RADIOLOGY REPORT*  Clinical Data:  Altered mental status.  Found down.  CT HEAD WITHOUT CONTRAST CT CERVICAL SPINE WITHOUT CONTRAST  Technique:  Multidetector CT imaging of the head and cervical spine  was performed following the standard protocol without intravenous contrast.  Multiplanar CT image reconstructions of the cervical spine were also generated.  Comparison:   None  CT HEAD  Findings: There is a large intraparenchymal hemorrhage involving the left frontal region.  This measures 2.8 x 5.6 by 4 cm.  Small left subdural hemorrhage is  identified, measuring 8.4 mm maximum diameter.  There is left to right midline shift, measuring 12 mm at its greatest point. Suspect early left uncal herniation. Subarachnoid blood is identified within the right lateral ventricle, occipital horn.  There is blood along the tentorium and along the falx.  There is effacement of the lateral ventricles, left greater than right.  Bone windows show a fracture of the right occipital bone, extending into the skull base and associated significant scalp hematoma.  There is significant opacity throughout the ethmoid and maxillary air cells, consistent with acute / chronic sinusitis.  IMPRESSION:  1.  Significant left frontal lobe intraparenchymal hematoma. 2.  Subdural hematoma involving the left frontal, parietal regions as well as the falx and tentorium. 3.  Subarachnoid blood within the left parietal region and occipital horn of the right lateral ventricle. 4.  Left to right midline shift. 5.  Suspect early uncal herniation.  Critical test results telephoned to Dr. Jeraldine Loots at the time of interpretation on date 04/28/2012 at time 12:12 p.m.  CT CERVICAL SPINE  Findings: Lower right skull base fracture is again identified. This extends from the right occipital region to the foramen magnum. There are degenerative changes throughout the cervical spine.  No evidence for acute cervical spine fracture.  Alignment is normal. The patient has endotracheal tube and nasogastric tube in place. Images of the lung apices are unremarkable.  IMPRESSION:  1.  Significant degenerative changes throughout the cervical spine. 2.  Skull base fracture. 3.  No evidence for acute fracture of the cervical spine.   Original Report Authenticated By: Norva Pavlov, M.D.    Ct Cervical Spine Wo Contrast  04/28/2012  *RADIOLOGY REPORT*  Clinical Data:  Altered mental status.  Found down.  CT HEAD WITHOUT CONTRAST CT CERVICAL SPINE WITHOUT CONTRAST  Technique:  Multidetector CT imaging of the head  and cervical spine was performed following the standard protocol without intravenous contrast.  Multiplanar CT image reconstructions of the cervical spine were also generated.  Comparison:   None  CT HEAD  Findings: There is a large intraparenchymal hemorrhage involving the left frontal region.  This measures 2.8 x 5.6 by 4 cm.  Small left subdural hemorrhage is identified, measuring 8.4 mm maximum diameter.  There is left to right midline shift, measuring 12 mm at its greatest point. Suspect early left uncal herniation. Subarachnoid blood is identified within the right lateral ventricle, occipital horn.  There is blood along the tentorium and along the falx.  There is effacement of the lateral ventricles, left greater than right.  Bone windows show a fracture of the right occipital bone, extending into the skull base and associated significant scalp hematoma.  There is significant opacity throughout the ethmoid and maxillary air cells, consistent with acute / chronic sinusitis.  IMPRESSION:  1.  Significant left frontal lobe intraparenchymal hematoma. 2.  Subdural hematoma involving the left frontal, parietal regions as well as the falx and tentorium. 3.  Subarachnoid blood within the left parietal region and occipital horn of the right lateral ventricle. 4.  Left to right midline shift. 5.  Suspect early uncal herniation.  Critical test results telephoned to  Dr. Jeraldine Loots at the time of interpretation on date 04/28/2012 at time 12:12 p.m.  CT CERVICAL SPINE  Findings: Lower right skull base fracture is again identified. This extends from the right occipital region to the foramen magnum. There are degenerative changes throughout the cervical spine.  No evidence for acute cervical spine fracture.  Alignment is normal. The patient has endotracheal tube and nasogastric tube in place. Images of the lung apices are unremarkable.  IMPRESSION:  1.  Significant degenerative changes throughout the cervical spine. 2.  Skull  base fracture. 3.  No evidence for acute fracture of the cervical spine.   Original Report Authenticated By: Norva Pavlov, M.D.    Dg Chest Port 1 View  04/30/2012  *RADIOLOGY REPORT*  Clinical Data: Intubated.  PORTABLE CHEST - 1 VIEW  Comparison: 04/29/2012  Findings: Support devices are stable.  Minimal left base atelectasis, improved since prior study.  Right lung is clear. Heart is borderline in size.  No effusions.  IMPRESSION: Decreasing left base atelectasis.   Original Report Authenticated By: Charlett Nose, M.D.    Dg Chest Port 1 View  04/29/2012  *RADIOLOGY REPORT*  Clinical Data: Tube placement.  PORTABLE CHEST - 1 VIEW  Comparison: 04/28/2012  Findings: Endotracheal tube has been advanced.  It is difficult to visualize the carina.  I suspect the tip of endotracheal tube is approximately 2-3 cm above the carina.  NG tube is in the stomach. Slight elevation of the left hemidiaphragm, new since prior study. Minimal left base atelectasis.  Right lung is clear.  IMPRESSION: Difficult to visualize the carina.  Endotracheal tube tip now appears to be approximately 2-3 cm above the carina.  Slight elevation of the left hemidiaphragm with left base atelectasis.   Original Report Authenticated By: Charlett Nose, M.D.    Dg Chest Port 1 View  04/28/2012  *RADIOLOGY REPORT*  Clinical Data: Post intubation  PORTABLE CHEST - 1 VIEW  Comparison: None.  Findings: Cardiomediastinal silhouette is unremarkable.  NG tube in place with tip in proximal stomach.  Endotracheal tube with tip in upper trachea about 8.2 cm above the carina.  No acute infiltrate or pulmonary edema.  No diagnostic pneumothorax.  IMPRESSION: No acute disease.  Endotracheal tube with tip in upper trachea about 8.2 cm above the carina.  No diagnostic pneumothorax.   Original Report Authenticated By: Natasha Mead, M.D.     Assessment/Plan: Doing fairly well POD 2. Will recheck ct head am tomorrow.  LOS: 2 days  as  above   Reinaldo Meeker, MD 04/30/2012, 9:44 AM

## 2012-04-30 NOTE — Progress Notes (Signed)
Peripherally Inserted Central Catheter/Midline Placement  The IV Nurse has discussed with the patient and/or persons authorized to consent for the patient, the purpose of this procedure and the potential benefits and risks involved with this procedure.  The benefits include less needle sticks, lab draws from the catheter and patient may be discharged home with the catheter.  Risks include, but not limited to, infection, bleeding, blood clot (thrombus formation), and puncture of an artery; nerve damage and irregular heat beat.  Alternatives to this procedure were also discussed.  PICC/Midline Placement Documentation  Explained to wife Guillermo Nehring.     Franne Grip Renee 04/30/2012, 6:11 PM

## 2012-04-30 NOTE — Progress Notes (Signed)
Brief Nutrition Follow Up  Pivot 1.5 started 11/11. Pt at risk for refeeding syndrome. Phosphorus being repleted.  Pivot 1.5 @ goal rate 35 ml/hr with 30 ml Prostat BID. Provides: 1460 kcal, 108 grams of protein, and 637 ml of H2O.  Propofol @ 24 ml/hr provides: 633 kcal Total kcal: 2093 kcal  Potassium  Date/Time Value Range Status  04/30/2012  4:50 AM 3.4* 3.5 - 5.1 mEq/L Final  04/29/2012  4:30 AM 3.6  3.5 - 5.1 mEq/L Final  04/28/2012  4:22 PM 3.4* 3.5 - 5.1 mEq/L Final    Phosphorus  Date/Time Value Range Status  04/30/2012  4:50 AM 1.4* 2.3 - 4.6 mg/dL Final  29/56/2130 86:57 PM 3.4  2.3 - 4.6 mg/dL Final    Magnesium  Date/Time Value Range Status  04/30/2012  4:50 AM 2.3  1.5 - 2.5 mg/dL Final  84/69/6295 28:41 PM 2.2  1.5 - 2.5 mg/dL Final    Kendell Bane RD, LDN, CNSC [phone] Pager [phone] After Hours Pager

## 2012-04-30 NOTE — Progress Notes (Signed)
PULMONARY  / CRITICAL CARE MEDICINE  Name: Wayne Buckley MRN: [id number] DOB: 09/24/1950    LOS: 2  REFERRING MD :  Jeraldine Loots (EDP)   CHIEF COMPLAINT:  AMS, vent     HISTORY OF PRESENT ILLNESS:  61 yo male with hx HTN, heavy ETOH presented 11/10 after being found unresponsive outside by wife. Unknown downtime - wife heard a sound about an hour before finding him.  Pt was out most of the night drinking (drinks daily, heavily on the weekends, unsure if he has even had withdrawal as he has never has a period of time without drinking) and when wife awoke in the am he was lying unresponsive on his back on the deck.  Wife started CPR although she was unsure if he was pulseless, on EMS arrival he had strong pulse and agonal respirations.  Did have laceration/hematoma to back of head and some cuts/scrapes on arms and legs.  Prior to admit was being treated for a "head cold" with amoxicillin but had been feeling better.  Poor GCS on arrival, intubated, se ETOH 121   BRIEF PATIENT DESCRIPTION:  61 yo male with hx HTN admitted 11/10 with AMS and SDH after fall.  Intubated for airway protection.    LEVEL OF CARE:  ICU PRIMARY SERVICE:  PCCM CONSULTANTS:  Neurosurgery  CODE STATUS: full DIET:  Npo  DVT Px:  SCD's  GI Px:  protonix    LINES / TUBES: ETT 11/10>>>  CULTURES: Results for orders placed during the hospital encounter of 04/28/12  MRSA PCR SCREENING     Status: Normal   Collection Time   04/28/12  6:06 PM      Component Value Range Status Comment   MRSA by PCR NEGATIVE  NEGATIVE Final   CULTURE, RESPIRATORY     Status: Normal (Preliminary result)   Collection Time   04/28/12 10:20 PM      Component Value Range Status Comment   Specimen Description TRACHEAL ASPIRATE   Final    Special Requests NONE   Final    Gram Stain     Final    Value: ABUNDANT WBC PRESENT, PREDOMINANTLY PMN     RARE SQUAMOUS EPITHELIAL CELLS PRESENT     FEW GRAM POSITIVE COCCI     IN PAIRS IN CLUSTERS  FEW GRAM NEGATIVE RODS   Culture Culture reincubated for better growth   Final    Report Status PENDING   Incomplete   URINE CULTURE     Status: Normal   Collection Time   04/28/12 11:43 PM      Component Value Range Status Comment   Specimen Description URINE, RANDOM   Final    Special Requests NONE   Final    Culture  Setup Time 04/29/2012 00:22   Final    Colony Count NO GROWTH   Final    Culture NO GROWTH   Final    Report Status 04/30/2012 FINAL   Final   CULTURE, BLOOD (ROUTINE X 2)     Status: Normal (Preliminary result)   Collection Time   04/29/12  4:35 AM      Component Value Range Status Comment   Specimen Description BLOOD LEFT HAND   Final    Special Requests BOTTLES DRAWN AEROBIC ONLY 5CC   Final    Culture  Setup Time 04/29/2012 09:17   Final    Culture     Final    Value:        BLOOD  CULTURE RECEIVED NO GROWTH TO DATE CULTURE WILL BE HELD FOR 5 DAYS BEFORE ISSUING A FINAL NEGATIVE REPORT   Report Status PENDING   Incomplete   CULTURE, BLOOD (ROUTINE X 2)     Status: Normal (Preliminary result)   Collection Time   04/29/12  4:45 AM      Component Value Range Status Comment   Specimen Description BLOOD LEFT ARM   Final    Special Requests BOTTLES DRAWN AEROBIC AND ANAEROBIC 5CC EACH   Final    Culture  Setup Time 04/29/2012 09:17   Final    Culture     Final    Value:        BLOOD CULTURE RECEIVED NO GROWTH TO DATE CULTURE WILL BE HELD FOR 5 DAYS BEFORE ISSUING A FINAL NEGATIVE REPORT   Report Status PENDING   Incomplete      ANTIBIOTICS: Anti-infectives     Start     Dose/Rate Route Frequency Ordered Stop   04/28/12 2359   ceFAZolin (ANCEF) IVPB 2 g/50 mL premix        2 g 100 mL/hr over 30 Minutes Intravenous 3 times per day 04/28/12 2343 04/29/12 2239   04/28/12 1506   bacitracin 50,000 Units in sodium chloride irrigation 0.9 % 500 mL irrigation  Status:  Discontinued          As needed 04/28/12 1513 04/28/12 1710           SIGNIFICANT EVENTS:    CT head 11/10>>> 1. Significant left frontal lobe intraparenchymal hematoma. 2. Subdural hematoma involving the left frontal, parietal regions as well as the falx and tentorium. 3. Subarachnoid blood within the left parietal region and occipital horn of the right lateral ventricle. 4. Left to right midline shift. 5. Suspect early uncal herniation.  EVENTS  04/29/12: Agitatedon diprivan with high BP. Surgery resulted in good hematoma evacuation. Niece at bedside   SUBJECTIVE/OVERNIGHT/INTERVAL HX  - still febrile  - Agitatd on wUA when diprivan off  - Moves only left side - wife not  At bedside  VITAL SIGNS: Temp:  [97.5 F (36.4 C)-101.5 F (38.6 C)] 101 F (38.3 C) (11/12 1200) Pulse Rate:  [70-105] 70  (11/12 1300) Resp:  [14-22] 16  (11/12 1300) BP: (90-186)/(59-98) 119/66 mmHg (11/12 1300) SpO2:  [98 %-100 %] 99 % (11/12 1300) Arterial Line BP: (127-182)/(55-68) 157/58 mmHg (11/12 1300) FiO2 (%):  [30 %-40 %] 30 % (11/12 1241)   PHYSICAL EXAMINATION: General:  wdwn male, NAD on vent in ER Neuro:  Sedated on propofol, Aigated on diprivan, Moves left side but not right HEENT:  Mm dry, no JVD, c-collar Cardiovascular:  s1s2 irreg Lungs:  resps even non labored on vent, cta  Abdomen:  Soft, +bs Ext: warm and dry, no edema   Ct Head Wo Contrast  04/29/2012  *RADIOLOGY REPORT*  Clinical Data: Follow-up hemorrhage.  CT HEAD WITHOUT CONTRAST  Technique:  Contiguous axial images were obtained from the base of the skull through the vertex without contrast.  Comparison: CT head without contrast 04/28/2012.  Findings: The patient is now status post left frontal craniotomy for evacuation of the hemorrhage.  The left frontal lobe parenchymal hemorrhage is largely evacuated. Midline shift is markedly reduced, now measuring 3 mm at the foramen of Monro.  The ventricular effacement is partially resolved.  There is some residual parenchymal hemorrhage, significantly smaller than previously  seen.  The widest component is only 6 mm.  A small amount of subdural  blood is again noted along the right side of the falx anteriorly.  A small amount of extra-axial fluid is present over the frontal convexity bilaterally.  Pneumocephalus is evident subjacent to the craniotomy.  Areas of subarachnoid hemorrhage are more apparent over the right frontal convexity than on the prior exam.  There was at least some subarachnoid blood on the prior exam.  Blood is again noted within the dependent portions of the ventricles.  An extra-axial hemorrhage in the left middle cranial fossa persists without change, measuring 19 mm in long axis.  No new hemorrhage is present.  No acute cortical infarct is evident.  The right occipital and parietal fracture is stable.  No additional fractures are evident.  A fluid level is present in the left maxillary sinus.  Scattered ethmoid opacification is present bilaterally.  Mastoid air cells are clear.  Atherosclerotic calcifications are again noted within the cavernous carotid arteries.  IMPRESSION:  1.  Interval evacuation of the left frontal hematoma. 2.  Significant reduction in mass effect and midline shift, measuring approximately 3 mm. 3.  Pneumocephalus within the surgical cavity and subjacent to the craniotomy. 4.  Persistent subdural and subarachnoid blood over the right frontal convexity. 5.  Stable appearance of the right occipital and parietal fracture.   Original Report Authenticated By: Marin Roberts, M.D.    Dg Chest Port 1 View  04/30/2012  *RADIOLOGY REPORT*  Clinical Data: Intubated.  PORTABLE CHEST - 1 VIEW  Comparison: 04/29/2012  Findings: Support devices are stable.  Minimal left base atelectasis, improved since prior study.  Right lung is clear. Heart is borderline in size.  No effusions.  IMPRESSION: Decreasing left base atelectasis.   Original Report Authenticated By: Charlett Nose, M.D.    Dg Chest Port 1 View  04/29/2012  *RADIOLOGY REPORT*  Clinical  Data: Tube placement.  PORTABLE CHEST - 1 VIEW  Comparison: 04/28/2012  Findings: Endotracheal tube has been advanced.  It is difficult to visualize the carina.  I suspect the tip of endotracheal tube is approximately 2-3 cm above the carina.  NG tube is in the stomach. Slight elevation of the left hemidiaphragm, new since prior study. Minimal left base atelectasis.  Right lung is clear.  IMPRESSION: Difficult to visualize the carina.  Endotracheal tube tip now appears to be approximately 2-3 cm above the carina.  Slight elevation of the left hemidiaphragm with left base atelectasis.   Original Report Authenticated By: Charlett Nose, M.D.      DIAGNOSES: Active Problems:  Acute respiratory failure  Subdural hematoma, post-traumatic   ASSESSMENT / PLAN: PULMONARY  Lab 04/29/12 0453 04/28/12 1258  PHART 7.429 7.277*  PCO2ART 33.0* 41.6  PO2ART 138.0* 50.0*  HCO3 21.5 19.4*  O2SAT 99.3 80.0    Ventilator Settings: Vent Mode:  [-] PRVC FiO2 (%):  [30 %-40 %] 30 % Set Rate:  [14 bmp] 14 bmp Vt Set:  [580 mL] 580 mL PEEP:  [5 cmH20] 5 cmH20 Plateau Pressure:  [13 cmH20-15 cmH20] 15 cmH20 CXR:  11/10>> clear, ETT high - insert 3-4  ASSESSMENT: Acute VDRF - r/t AMS/neuro event  On 04/30/2012: does not meet SBT criteria due to cns issues   PLAN:   Cont vent support, 8cc/kg  F/u CXR   CARDIOVASCULAR  Lab 04/29/12 1012 04/28/12 1106  TROPONINI -- --  CKTOTAL -- --  CKMB -- --  LATICACIDVEN 0.7 5.7*  PROBNP -- --  O2SATVEN -- --    ASSESSMENT:  Hx HTN  ?Afib  PLAN:  F/u card panel     RENAL  Lab 04/30/12 0450 04/29/12 0430 04/28/12 1622 04/28/12 1244 04/28/12 1105  NA 137 136 134* -- --  K 3.4* 3.6 3.4* -- --  CL 106 103 -- -- 92*  CO2 24 23 -- -- 19  BUN 9 9 -- -- 10  CREATININE 0.49* 0.69 -- -- 0.56  CALCIUM 8.1* 7.8* -- -- 8.6  MG 2.3 -- -- 2.2 --  PHOS 1.4* -- -- 3.4 --   Intake/Output      11/11 0701 - 11/12 0700 11/12 0701 - 11/13 0700   I.V. (mL/kg)  2227 (27.8) 588 (7.4)   Other 10 120   NG/GT 25 150   IV Piggyback 50    Total Intake(mL/kg) 2312 (28.9) 858 (10.7)   Urine (mL/kg/hr) 1670 (0.9) 310 (0.6)   Emesis/NG output 190    Drains 60 0   Blood     Total Output 1920 310   Net +392 +548          ASSESSMENT:   Hyponatremia at admit - resolved Lactic acidosis  At admit resolved   - on 04/30/12: hypophos and hypokalemia; repleted via elink.   PLAN:   F/u chem    GASTROINTESTINAL  Lab 04/28/12 1105  AST 88*  ALT 42  ALKPHOS 60  PROT 7.9  ALBUMIN 4.3    ASSESSMENT:   Hx ETOH -se ETOH 121 UDS- THC + 04/29/12 - at risk for etoh wd  PLAN:   Int f/u LFTS   HEMATOLOGIC  Lab 04/30/12 0450 04/29/12 0430 04/28/12 1622 04/28/12 1105  HGB 9.9* 11.6* 12.6* --  HCT 29.2* 33.5* 37.0* --  PLT 204 248 -- 375  INR -- -- -- 1.06  APTT -- -- -- --    ASSESSMENT:   No acute issue  PLAN:  SCD's for dvt proph in setting SDH  - PRBC for hgb </= 6.9gm%    - exceptions are   -  if ACS susepcted/confirmed then transfuse for hgb </= 8.0gm%,  or    -  If septic shock first 24h and scvo2 < 70% then transfuse for hgb </= 9.0gm%   - active bleeding with hemodynamic instability, then transfuse regardless of hemoglobin value   At at all times try to transfuse 1 unit prbc as possible with exception of active hemorrhage    INFECTIOUS  Lab 04/30/12 0450 04/29/12 0430 04/28/12 1244 04/28/12 1105  PROCALCITON -- -- <0.10 --  WBC 11.5* 10.9* -- 26.8*    ASSESSMENT:   Leukocytosis - unknown etiology. CXR ess clear.   Recent "cold" on current abx    - 04/29/12 - febrile - 04/30/12 - febrile  PLAN:   Check PCT Pan culture Empiric zosyn      ENDOCRINE CBG  Lab 04/30/12 1208 04/30/12 0809 04/29/12 2348 04/29/12 1751 04/29/12 1220  GLUCAP 116* 126* 104* 121* 118*    ASSESSMENT:   No active issue   PLAN:   Monitor blood sugar on chem   NEUROLOGIC  ASSESSMENT:   AMS  Fall  SDH -- extensive with L->R shift     - 04/29/12 and  04/30/12: CT shows improvement. Exam improved but agitated. AT risk for etoh wd  PLAN:   Sedation with driprivan IF still agitated, use precedex  Both can control etoh wd   CRITICAL CARE: The patient is critically ill with multiple organ systems failure and requires high complexity decision making for assessment and support, frequent evaluation  and titration of therapies, application of advanced monitoring technologies and extensive interpretation of multiple databases. Critical Care Time devoted to patient care services described in this note is 35  minutes.    Dr. Kalman Shan, M.D., [facility].C.P Pulmonary and Critical Care Medicine Staff Physician [facility] [place] [facility] Pager: [phone], If no answer or between  15:00h - 7:00h: call [phone]  04/30/2012 1:11 PM

## 2012-04-30 NOTE — Progress Notes (Addendum)
ANTIBIOTIC CONSULT NOTE - INITIAL  Pharmacy Consult for Zosyn Indication: rule out sepsis  No Known Allergies  Patient Measurements: Height: 5\' 10"  (177.8 cm) Weight: 176 lb 5.9 oz (80 kg) IBW/kg (Calculated) : 73   Vital Signs: Temp: 101 F (38.3 C) (11/12 1200) Temp src: Oral (11/12 1200) BP: 119/66 mmHg (11/12 1300) Pulse Rate: 70  (11/12 1300) Intake/Output from previous day: 11/11 0701 - 11/12 0700 In: 2312 [I.V.:2227; NG/GT:25; IV Piggyback:50] Out: 1920 [Urine:1670; Emesis/NG output:190; Drains:60] Intake/Output from this shift: Total I/O In: 858 [I.V.:588; Other:120; NG/GT:150] Out: 310 [Urine:310]  Labs:  Basename 04/30/12 0450 04/29/12 0430 04/28/12 1622 04/28/12 1105  WBC 11.5* 10.9* -- 26.8*  HGB 9.9* 11.6* 12.6* --  PLT 204 248 -- 375  LABCREA -- -- -- --  CREATININE 0.49* 0.69 -- 0.56   Estimated Creatinine Clearance: 98.9 ml/min (by C-G formula based on Cr of 0.49).  Medical History: Past Medical History  Diagnosis Date  . Hypertension    Assessment: 61yom admitted with a subdural hematoma now to begin zosyn for possible sepsis. Although chest xray is essentially clear, he is persistently febrile with leukocytosis. Renal function is stable.   11/10 urine cx>> negative final 11/10 resp cx>> GPC, GNR 11/11 blood cx>> ngtd  Goal of Therapy:  Appropriate zosyn dosing  Plan:  1) Zosyn 3.375g IV q8h (4 hour infusion) 2) Follow renal function, cultures, clinical status  Fredrik Rigger 04/30/2012,1:20 PM

## 2012-05-01 ENCOUNTER — Inpatient Hospital Stay (HOSPITAL_COMMUNITY): Payer: 59

## 2012-05-01 LAB — CBC
Hemoglobin: 9.9 g/dL — ABNORMAL LOW (ref 13.0–17.0)
MCH: 31.4 pg (ref 26.0–34.0)
MCV: 90.5 fL (ref 78.0–100.0)
RBC: 3.15 MIL/uL — ABNORMAL LOW (ref 4.22–5.81)

## 2012-05-01 LAB — GLUCOSE, CAPILLARY: Glucose-Capillary: 134 mg/dL — ABNORMAL HIGH (ref 70–99)

## 2012-05-01 LAB — TRIGLYCERIDES: Triglycerides: 615 mg/dL — ABNORMAL HIGH (ref <150)

## 2012-05-01 LAB — MAGNESIUM: Magnesium: 2.3 mg/dL (ref 1.5–2.5)

## 2012-05-01 LAB — BASIC METABOLIC PANEL
CO2: 24 mEq/L (ref 19–32)
Calcium: 8 mg/dL — ABNORMAL LOW (ref 8.4–10.5)
Chloride: 103 mEq/L (ref 96–112)
Creatinine, Ser: 0.5 mg/dL (ref 0.50–1.35)
Glucose, Bld: 110 mg/dL — ABNORMAL HIGH (ref 70–99)
Sodium: 135 mEq/L (ref 135–145)

## 2012-05-01 LAB — CULTURE, RESPIRATORY W GRAM STAIN

## 2012-05-01 LAB — PROCALCITONIN: Procalcitonin: 0.53 ng/mL

## 2012-05-01 MED ORDER — PIVOT 1.5 CAL PO LIQD
1000.0000 mL | ORAL | Status: DC
  Administered 2012-05-01: 1000 mL
  Filled 2012-05-01 (×3): qty 1000

## 2012-05-01 MED ORDER — MAGNESIUM SULFATE IN D5W 10-5 MG/ML-% IV SOLN
1.0000 g | Freq: Once | INTRAVENOUS | Status: AC
  Administered 2012-05-01: 1 g via INTRAVENOUS
  Filled 2012-05-01: qty 100

## 2012-05-01 MED ORDER — SODIUM PHOSPHATE 3 MMOLE/ML IV SOLN: 30.0000 mmol | Freq: Once | INTRAVENOUS | Status: DC

## 2012-05-01 MED ORDER — SODIUM GLYCEROPHOSPHATE 1 MMOLE/ML IV SOLN
30.0000 mmol | Freq: Once | INTRAVENOUS | Status: AC
  Administered 2012-05-01: 30 mmol via INTRAVENOUS
  Filled 2012-05-01: qty 30

## 2012-05-01 MED ORDER — MAGNESIUM SULFATE 50 % IJ SOLN: 1.0000 g | Freq: Once | INTRAVENOUS | Status: DC

## 2012-05-01 MED ORDER — POTASSIUM PHOSPHATE DIBASIC 3 MMOLE/ML IV SOLN: 30.0000 mmol | Freq: Once | INTRAVENOUS | Status: DC

## 2012-05-01 MED ORDER — PRO-STAT SUGAR FREE PO LIQD
30.0000 mL | Freq: Four times a day (QID) | ORAL | Status: DC
  Administered 2012-05-01 (×3): 30 mL
  Filled 2012-05-01 (×7): qty 30

## 2012-05-01 NOTE — Progress Notes (Signed)
Patient ID: Wayne Buckley, male   DOB: Apr 09, 1951, 61 y.o.   MRN: [id number] Subjective: Patient reports intubated  Objective: Vital signs in last 24 hours: Temp:  [99.1 F (37.3 C)-101 F (38.3 C)] 100.2 F (37.9 C) (11/13 0800) Pulse Rate:  [68-113] 68  (11/13 0800) Resp:  [15-22] 18  (11/13 0800) BP: (119-186)/(59-76) 152/76 mmHg (11/13 0800) SpO2:  [98 %-100 %] 99 % (11/13 0800) Arterial Line BP: (157-180)/(55-65) 177/61 mmHg (11/12 1500) FiO2 (%):  [30 %] 30 % (11/13 0700) Weight:  [89 kg (196 lb 3.4 oz)] 89 kg (196 lb 3.4 oz) (11/13 0500)  Intake/Output from previous day: 11/12 0701 - 11/13 0700 In: 3690 [I.V.:2370; NG/GT:770; IV Piggyback:430] Out: 1370 [Urine:1360; Drains:10] Intake/Output this shift: Total I/O In: 75 [I.V.:75] Out: -   Eyes open a bit, purposeful on left  Lab Results:  [facility] [address] 05/01/12 0345 04/30/12 0450  WBC 11.6* 11.5*  HGB 9.9* 9.9*  HCT 28.5* 29.2*  PLT 208 204   BMET  Basename 05/01/12 0345 04/30/12 0450  NA 135 137  K 3.9 3.4*  CL 103 106  CO2 24 24  GLUCOSE 110* 133*  BUN 11 9  CREATININE 0.50 0.49*  CALCIUM 8.0* 8.1*    Studies/Results: Ct Head Wo Contrast  05/01/2012  *RADIOLOGY REPORT*  Clinical Data: Follow-up subdural, subarachnoid hemorrhage.  CT HEAD WITHOUT CONTRAST  Technique:  Contiguous axial images were obtained from the base of the skull through the vertex without contrast.  Comparison: 04/29/2012  Findings: Postoperative changes on the left.  Stable pneumocephalus.  Stable postoperative changes in the left frontal lobe.  Area of residual hemorrhage within the left frontal lobe is stable.  Stable slight left to right midline shift of approximately 4 mm.  Previously seen subarachnoid blood has resolved.  Area of hemorrhage in the left temporal lobe slightly smaller, 15 mm compared to 19 mm previously.  Intraventricular blood is stable. No hydrocephalus.  IMPRESSION: Postoperative changes from evacuation of left frontal  hematoma. Pneumocephalus and residual left frontal hemorrhage are stable. Slight decrease in the size of the left temporal hemorrhage. Resolve subarachnoid blood.  Stable intraventricular blood.  No hydrocephalus.   Original Report Authenticated By: Charlett Nose, M.D.    Dg Chest Port 1 View  05/01/2012  *RADIOLOGY REPORT*  Clinical Data: Ventilator.  PORTABLE CHEST - 1 VIEW  Comparison: 04/30/2012  Findings: Endotracheal tube and NG tube are unchanged.  Interval placement of right PICC line with the tip at the cavoatrial junction.  Increasing left base atelectasis.  Right lung is clear. Heart is borderline in size.  IMPRESSION: Increasing left basilar density, likely atelectasis.   Original Report Authenticated By: Charlett Nose, M.D.    Dg Chest Port 1 View  04/30/2012  *RADIOLOGY REPORT*  Clinical Data: Intubated.  PORTABLE CHEST - 1 VIEW  Comparison: 04/29/2012  Findings: Support devices are stable.  Minimal left base atelectasis, improved since prior study.  Right lung is clear. Heart is borderline in size.  No effusions.  IMPRESSION: Decreasing left base atelectasis.   Original Report Authenticated By: Charlett Nose, M.D.     Assessment/Plan: Slow improvement. CT today looking slowly better. Will slowly wean per CCM  LOS: 3 days  As above   Reinaldo Meeker, MD 05/01/2012, 8:37 AM

## 2012-05-01 NOTE — Progress Notes (Signed)
eLink Physician-Brief Progress Note Patient Name: HERBY AMICK DOB: 1950/12/01 MRN: [id number]  Date of Service  05/01/2012   HPI/Events of Note     eICU Interventions  Hypokalemia - repleted   Hypophosphatemia - repleted   Intervention Category Intermediate Interventions: Electrolyte abnormality - evaluation and management  DETERDING,ELIZABETH 05/01/2012, 4:41 AM

## 2012-05-01 NOTE — Progress Notes (Signed)
Nutrition Follow-up  Intervention:   Change TF regimen to Pivot 1.5 @ 30 ml/hr with 30 ml Prostat QID New regimen will provide: 1480 kcal, 127 grams protein, 546 ml H2O. With propofol rate will provide: 2113 kcal (104% of needs)  Assessment:   Patient is currently intubated for airway protection, but is off vent and weaning. Per RN pt weaned for 1 1/2 hr yesterday but needed to be put back on vent support. Pt is also off of his propofol, wife at bedside and holding his hand to keep him from pulling his ETT out. Per RN will likely go back on propofol and vent today.  Per wife pt would eat normally throughout the day. He is a good cook and ate well. He would then drink 6-10 beers out with friends at night.   Pt is POD #3 for craniotomy, evacuation of SDH, evacuation of L frontal hemorrhage and partial L frontal lobectomy.  MV: 9.2-15.8 Temp:Temp (24hrs), Avg:100 F (37.8 C), Min:99.1 F (37.3 C), Max:101 F (38.3 C)  Propofol: 24 ml/hr providing: 633 kcal, currently off Phosphorus being repleted  Diet Order:  NPO  Meds: Scheduled Meds:   . sodium chloride  1,000 mL Intravenous Once  . antiseptic oral rinse  15 mL Mouth Rinse QID  . chlorhexidine  15 mL Mouth Rinse BID  . feeding supplement (PIVOT 1.5 CAL)  1,000 mL Per Tube Q24H  . feeding supplement  30 mL Per Tube BID  . folic acid  1 mg Oral Daily  . magnesium sulfate 1 - 4 g bolus IVPB  1 g Intravenous Once  . pantoprazole sodium  40 mg Per Tube QHS  . piperacillin-tazobactam (ZOSYN)  IV  3.375 g Intravenous Q8H  . [COMPLETED] potassium phosphate IVPB (mmol)  30 mmol Intravenous Once  . sodium chloride  10-40 mL Intracatheter Q12H  . sodium glycerophosphate 0.9% NaCl IVPB  30 mmol Intravenous Once  . thiamine  100 mg Intravenous Daily  . [DISCONTINUED] magnesium sulfate LVP 250-500 ml  1 g Intravenous Once  . [DISCONTINUED] pantoprazole (PROTONIX) IV  40 mg Intravenous QHS  . [DISCONTINUED] potassium phosphate IVPB (mmol)  30  mmol Intravenous Once  . [DISCONTINUED] sodium phosphate  Dextrose 5% IVPB  30 mmol Intravenous Once   Continuous Infusions:   . sodium chloride 75 mL/hr at 05/01/12 0800  . propofol Stopped (05/01/12 0700)   PRN Meds:.sodium chloride, acetaminophen (TYLENOL) oral liquid 160 mg/5 mL, fentaNYL, hydrALAZINE, labetalol, sodium chloride   CMP     Component Value Date/Time   NA 135 05/01/2012 0345   K 3.9 05/01/2012 0345   CL 103 05/01/2012 0345   CO2 24 05/01/2012 0345   GLUCOSE 110* 05/01/2012 0345   BUN 11 05/01/2012 0345   CREATININE 0.50 05/01/2012 0345   CALCIUM 8.0* 05/01/2012 0345   PROT 7.9 04/28/2012 1105   ALBUMIN 4.3 04/28/2012 1105   AST 88* 04/28/2012 1105   ALT 42 04/28/2012 1105   ALKPHOS 60 04/28/2012 1105   BILITOT 0.3 04/28/2012 1105   GFRNONAA >90 05/01/2012 0345   GFRAA >90 05/01/2012 0345   CBG (last 3)   Basename 05/01/12 0539 04/30/12 2335 04/30/12 1853  GLUCAP 109* 103* 114*   Sodium  Date/Time Value Range Status  05/01/2012  3:45 AM 135  135 - 145 mEq/L Final  04/30/2012  4:50 AM 137  135 - 145 mEq/L Final  04/29/2012  4:30 AM 136  135 - 145 mEq/L Final    Potassium  Date/Time  Value Range Status  05/01/2012  3:45 AM 3.9  3.5 - 5.1 mEq/L Final  04/30/2012  4:50 AM 3.4* 3.5 - 5.1 mEq/L Final  04/29/2012  4:30 AM 3.6  3.5 - 5.1 mEq/L Final    Phosphorus  Date/Time Value Range Status  05/01/2012  3:45 AM 1.9* 2.3 - 4.6 mg/dL Final  16/03/9603  5:40 AM 1.4* 2.3 - 4.6 mg/dL Final  98/04/9146 82:95 PM 3.4  2.3 - 4.6 mg/dL Final    Magnesium  Date/Time Value Range Status  05/01/2012  3:45 AM 2.3  1.5 - 2.5 mg/dL Final  62/13/0865  7:84 AM 2.3  1.5 - 2.5 mg/dL Final  69/62/9528 41:32 PM 2.2  1.5 - 2.5 mg/dL Final    Intake/Output Summary (Last 24 hours) at 05/01/12 0929 Last data filed at 05/01/12 0800  Gross per 24 hour  Intake   3517 ml  Output   1230 ml  Net   2287 ml   Weight Status:  176 lb on admission 11/10 196 lb 11/13 176 lb  11/11  Re-estimated needs:  2037 kcal; 120-144 grams protein  Nutrition Dx:  Inadequate oral intake r/t inability to eat AEB NPO status; ongoing.  Goal: Pt to meet >/= 90% of their estimated nutrition needs; met.  Monitor:  Vent status, propofol rate, TF tolerance, weight   Kendell Bane RD, LDN, CNSC [phone] Pager [phone] After Hours Pager

## 2012-05-01 NOTE — Progress Notes (Signed)
PULMONARY  / CRITICAL CARE MEDICINE  Name: Wayne Buckley MRN: [id number] DOB: 03-16-1951    LOS: 3  REFERRING MD :  Jeraldine Loots (EDP)   CHIEF COMPLAINT:  AMS, vent     HISTORY OF PRESENT ILLNESS:  61 yo male with hx HTN, heavy ETOH presented 11/10 after being found unresponsive outside by wife. Unknown downtime - wife heard a sound about an hour before finding him.  Pt was out most of the night drinking (drinks daily, heavily on the weekends, unsure if he has even had withdrawal as he has never has a period of time without drinking) and when wife awoke in the am he was lying unresponsive on his back on the deck.  Wife started CPR although she was unsure if he was pulseless, on EMS arrival he had strong pulse and agonal respirations.  Did have laceration/hematoma to back of head and some cuts/scrapes on arms and legs.  Prior to admit was being treated for a "head cold" with amoxicillin but had been feeling better.  Poor GCS on arrival, intubated, se ETOH 121   BRIEF PATIENT DESCRIPTION:  61 yo male with hx HTN admitted 11/10 with AMS and SDH after fall.  Intubated for airway protection.    LEVEL OF CARE:  ICU PRIMARY SERVICE:  PCCM CONSULTANTS:  Neurosurgery  CODE STATUS: full DIET:  Npo  DVT Px:  SCD's  GI Px:  protonix    LINES / TUBES: ETT 11/10>>> PICC line RUE 04/30/12 >>  CULTURES: Results for orders placed during the hospital encounter of 04/28/12  MRSA PCR SCREENING     Status: Normal   Collection Time   04/28/12  6:06 PM      Component Value Range Status Comment   MRSA by PCR NEGATIVE  NEGATIVE Final   CULTURE, RESPIRATORY     Status: Normal (Preliminary result)   Collection Time   04/28/12 10:20 PM      Component Value Range Status Comment   Specimen Description TRACHEAL ASPIRATE   Final    Special Requests NONE   Final    Gram Stain     Final    Value: ABUNDANT WBC PRESENT, PREDOMINANTLY PMN     RARE SQUAMOUS EPITHELIAL CELLS PRESENT     FEW GRAM POSITIVE COCCI      IN PAIRS IN CLUSTERS FEW GRAM NEGATIVE RODS   Culture Culture reincubated for better growth   Final    Report Status PENDING   Incomplete   URINE CULTURE     Status: Normal   Collection Time   04/28/12 11:43 PM      Component Value Range Status Comment   Specimen Description URINE, RANDOM   Final    Special Requests NONE   Final    Culture  Setup Time 04/29/2012 00:22   Final    Colony Count NO GROWTH   Final    Culture NO GROWTH   Final    Report Status 04/30/2012 FINAL   Final   CULTURE, BLOOD (ROUTINE X 2)     Status: Normal (Preliminary result)   Collection Time   04/29/12  4:35 AM      Component Value Range Status Comment   Specimen Description BLOOD LEFT HAND   Final    Special Requests BOTTLES DRAWN AEROBIC ONLY 5CC   Final    Culture  Setup Time 04/29/2012 09:17   Final    Culture     Final    Value:  BLOOD CULTURE RECEIVED NO GROWTH TO DATE CULTURE WILL BE HELD FOR 5 DAYS BEFORE ISSUING A FINAL NEGATIVE REPORT   Report Status PENDING   Incomplete   CULTURE, BLOOD (ROUTINE X 2)     Status: Normal (Preliminary result)   Collection Time   04/29/12  4:45 AM      Component Value Range Status Comment   Specimen Description BLOOD LEFT ARM   Final    Special Requests BOTTLES DRAWN AEROBIC AND ANAEROBIC 5CC EACH   Final    Culture  Setup Time 04/29/2012 09:17   Final    Culture     Final    Value:        BLOOD CULTURE RECEIVED NO GROWTH TO DATE CULTURE WILL BE HELD FOR 5 DAYS BEFORE ISSUING A FINAL NEGATIVE REPORT   Report Status PENDING   Incomplete   CULTURE, RESPIRATORY     Status: Normal (Preliminary result)   Collection Time   04/30/12  5:20 PM      Component Value Range Status Comment   Specimen Description TRACHEAL ASPIRATE   Final    Special Requests Normal   Final    Gram Stain     Final    Value: MODERATE WBC PRESENT,BOTH PMN AND MONONUCLEAR     FEW SQUAMOUS EPITHELIAL CELLS PRESENT     NO ORGANISMS SEEN   Culture PENDING   Incomplete    Report Status  PENDING   Incomplete      ANTIBIOTICS: Anti-infectives     Start     Dose/Rate Route Frequency Ordered Stop   04/30/12 1400  piperacillin-tazobactam (ZOSYN) IVPB 3.375 g       3.375 g 12.5 mL/hr over 240 Minutes Intravenous 3 times per day 04/30/12 1324     04/28/12 2359   ceFAZolin (ANCEF) IVPB 2 g/50 mL premix        2 g 100 mL/hr over 30 Minutes Intravenous 3 times per day 04/28/12 2343 04/29/12 2239   04/28/12 1506   bacitracin 50,000 Units in sodium chloride irrigation 0.9 % 500 mL irrigation  Status:  Discontinued          As needed 04/28/12 1513 04/28/12 1710           SIGNIFICANT EVENTS:  CT head 11/10>>> 1. Significant left frontal lobe intraparenchymal hematoma. 2. Subdural hematoma involving the left frontal, parietal regions as well as the falx and tentorium. 3. Subarachnoid blood within the left parietal region and occipital horn of the right lateral ventricle. 4. Left to right midline shift. 5. Suspect early uncal herniation.  04/29/12: Agitatedon diprivan with high BP. Surgery resulted in good hematoma evacuation. Niece at bedside  04/30/12: febrile. ? LLL PNA. STart zosyn. Agitated  SUBJECTIVE/OVERNIGHT/INTERVAL HX  - still febrile but temp curve coming down  - Agitatd on wUA when diprivan off but appears improved. Improving/stable ct head  - Moves only left side - wife at bedside  VITAL SIGNS: Temp:  [99.1 F (37.3 C)-101 F (38.3 C)] 100.2 F (37.9 C) (11/13 0800) Pulse Rate:  [68-113] 68  (11/13 0800) Resp:  [15-22] 18  (11/13 0800) BP: (119-186)/(59-76) 152/76 mmHg (11/13 0800) SpO2:  [98 %-100 %] 99 % (11/13 0800) Arterial Line BP: (157-180)/(55-65) 177/61 mmHg (11/12 1500) FiO2 (%):  [30 %] 30 % (11/13 0700) Weight:  [89 kg (196 lb 3.4 oz)] 89 kg (196 lb 3.4 oz) (11/13 0500)   PHYSICAL EXAMINATION: General:  wdwn male, NAD on vent in ER Neuro:  Off diprivan - left side agitated, rt side no movement. periodically reaches for ET tube, RASS + 1  to +2. occ follows commands. No tracking  HEENT:  Mm dry, no JVD, Cardiovascular:  s1s2 irreg Lungs:  resps even non labored on vent, cta  Abdomen:  Soft, +bs Ext: warm and dry, no edema   Ct Head Wo Contrast  05/01/2012  *RADIOLOGY REPORT*  Clinical Data: Follow-up subdural, subarachnoid hemorrhage.  CT HEAD WITHOUT CONTRAST  Technique:  Contiguous axial images were obtained from the base of the skull through the vertex without contrast.  Comparison: 04/29/2012  Findings: Postoperative changes on the left.  Stable pneumocephalus.  Stable postoperative changes in the left frontal lobe.  Area of residual hemorrhage within the left frontal lobe is stable.  Stable slight left to right midline shift of approximately 4 mm.  Previously seen subarachnoid blood has resolved.  Area of hemorrhage in the left temporal lobe slightly smaller, 15 mm compared to 19 mm previously.  Intraventricular blood is stable. No hydrocephalus.  IMPRESSION: Postoperative changes from evacuation of left frontal hematoma. Pneumocephalus and residual left frontal hemorrhage are stable. Slight decrease in the size of the left temporal hemorrhage. Resolve subarachnoid blood.  Stable intraventricular blood.  No hydrocephalus.   Original Report Authenticated By: Charlett Nose, M.D.    Dg Chest Port 1 View  05/01/2012  *RADIOLOGY REPORT*  Clinical Data: Ventilator.  PORTABLE CHEST - 1 VIEW  Comparison: 04/30/2012  Findings: Endotracheal tube and NG tube are unchanged.  Interval placement of right PICC line with the tip at the cavoatrial junction.  Increasing left base atelectasis.  Right lung is clear. Heart is borderline in size.  IMPRESSION: Increasing left basilar density, likely atelectasis.   Original Report Authenticated By: Charlett Nose, M.D.    Dg Chest Port 1 View  04/30/2012  *RADIOLOGY REPORT*  Clinical Data: Intubated.  PORTABLE CHEST - 1 VIEW  Comparison: 04/29/2012  Findings: Support devices are stable.  Minimal left base  atelectasis, improved since prior study.  Right lung is clear. Heart is borderline in size.  No effusions.  IMPRESSION: Decreasing left base atelectasis.   Original Report Authenticated By: Charlett Nose, M.D.      DIAGNOSES: Active Problems:  Acute respiratory failure  Subdural hematoma, post-traumatic   ASSESSMENT / PLAN: PULMONARY  Lab 04/29/12 0453 04/28/12 1258  PHART 7.429 7.277*  PCO2ART 33.0* 41.6  PO2ART 138.0* 50.0*  HCO3 21.5 19.4*  O2SAT 99.3 80.0    Ventilator Settings: Vent Mode:  [-] PRVC FiO2 (%):  [30 %] 30 % Set Rate:  [14 bmp] 14 bmp Vt Set:  [580 mL] 580 mL PEEP:  [5 cmH20] 5 cmH20 Plateau Pressure:  [13 cmH20-15 cmH20] 14 cmH20   ASSESSMENT: Acute VDRF - r/t AMS/neuro event  On 05/01/2012: doing SBT but does not meet extubation criteria due to cns issues   PLAN:   Cont vent support, 8cc/kg  F/u CXR   CARDIOVASCULAR  Lab 05/01/12 0345 04/29/12 1012 04/28/12 1106  TROPONINI <0.30 -- --  CKTOTAL -- -- --  CKMB -- -- --  LATICACIDVEN -- 0.7 5.7*  PROBNP -- -- --  O2SATVEN -- -- --    ASSESSMENT:  Hx HTN   05/01/2012: BP is MAP 92 and sbo  156   PLAN:  Monitor , sbp < 160    RENAL  Lab 05/01/12 0345 04/30/12 0450 04/29/12 0430 04/28/12 1244  NA 135 137 136 --  K 3.9 3.4* 3.6 --  CL  103 106 103 --  CO2 24 24 23  --  BUN 11 9 9  --  CREATININE 0.50 0.49* 0.69 --  CALCIUM 8.0* 8.1* 7.8* --  MG 2.3 2.3 -- 2.2  PHOS 1.9* 1.4* -- 3.4   Intake/Output      11/12 0701 - 11/13 0700 11/13 0701 - 11/14 0700   I.V. (mL/kg) 2370 (26.6) 75 (0.8)   Other 120    NG/GT 770    IV Piggyback 430    Total Intake(mL/kg) 3690 (41.5) 75 (0.8)   Urine (mL/kg/hr) 1360 (0.6)    Emesis/NG output     Drains 10    Total Output 1370    Net +2320 +75          ASSESSMENT:   Hyponatremia at admit - resolved Lactic acidosis  At admit resolved   - on 04/30/12: mild hypomag.   PLAN:   Replete mag F/u chem    GASTROINTESTINAL  Lab 04/28/12  1105  AST 88*  ALT 42  ALKPHOS 60  PROT 7.9  ALBUMIN 4.3    ASSESSMENT:   Hx ETOH -se ETOH 121 UDS- THC + 04/29/12 - at risk for etoh wd  PLAN:   Int f/u LFTS Start tube feeds 05/01/12  HEMATOLOGIC  Lab 05/01/12 0345 04/30/12 0450 04/29/12 0430 04/28/12 1105  HGB 9.9* 9.9* 11.6* --  HCT 28.5* 29.2* 33.5* --  PLT 208 204 248 --  INR -- -- -- 1.06  APTT -- -- -- --    ASSESSMENT:   Anemia of critical illness  PLAN:  SCD's for dvt proph in setting SDH  - PRBC for hgb </= 6.9gm%    - exceptions are   -  if ACS susepcted/confirmed then transfuse for hgb </= 8.0gm%,  or    -  If septic shock first 24h and scvo2 < 70% then transfuse for hgb </= 9.0gm%   - active bleeding with hemodynamic instability, then transfuse regardless of hemoglobin value   At at all times try to transfuse 1 unit prbc as possible with exception of active hemorrhage    INFECTIOUS  Lab 05/01/12 0345 04/30/12 1500 04/30/12 0450 04/29/12 0430 04/28/12 1244  PROCALCITON 0.53 0.53 -- -- <0.10  WBC 11.6* -- 11.5* 10.9* --    ASSESSMENT:   Leukocytosis - unknown etiology. CXR ess clear.   Recent "cold" on current abx    - 04/29/12 - febrile - 04/30/12 - febrile. STart zosyn 04/30/12 for possible HAP - 05/01/12 - PCT suggesting localized infection with LLL PNA on CXR. Fever curve improving  PLAN:   Await culture Empiric zosyn since 04/30/12     ENDOCRINE CBG  Lab 05/01/12 0539 04/30/12 2335 04/30/12 1853 04/30/12 1208 04/30/12 0809  GLUCAP 109* 103* 114* 116* 126*    ASSESSMENT:   No active issue   PLAN:   Monitor blood sugar on chem   NEUROLOGIC  ASSESSMENT:   AMS  Fall  SDH -- extensive with L->R shift    -  05/01/12: CT shows improvement. Exam improved but agitated. AT risk for etoh wd  PLAN:   Sedation with driprivan IF still agitated, use precedex    Wife updated D/w Dr Pennie Banter at bedside   CRITICAL CARE: The patient is critically ill with multiple organ systems  failure and requires high complexity decision making for assessment and support, frequent evaluation and titration of therapies, application of advanced monitoring technologies and extensive interpretation of multiple databases. Critical Care Time devoted to patient care services  described in this note is 35  minutes.    Dr. Kalman Shan, M.D., [facility].C.P Pulmonary and Critical Care Medicine Staff Physician [facility] [place] [facility] Pager: [phone], If no answer or between  15:00h - 7:00h: call [phone]  05/01/2012 8:42 AM

## 2012-05-02 ENCOUNTER — Inpatient Hospital Stay (HOSPITAL_COMMUNITY): Payer: 59

## 2012-05-02 DIAGNOSIS — J690 Pneumonitis due to inhalation of food and vomit: Secondary | ICD-10-CM | POA: Diagnosis present

## 2012-05-02 DIAGNOSIS — M7989 Other specified soft tissue disorders: Secondary | ICD-10-CM

## 2012-05-02 LAB — PHOSPHORUS: Phosphorus: 2 mg/dL — ABNORMAL LOW (ref 2.3–4.6)

## 2012-05-02 LAB — BASIC METABOLIC PANEL
CO2: 25 mEq/L (ref 19–32)
Calcium: 8.5 mg/dL (ref 8.4–10.5)
Creatinine, Ser: 0.43 mg/dL — ABNORMAL LOW (ref 0.50–1.35)
GFR calc Af Amer: >90 mL/min (ref >90)
GFR calc non Af Amer: >90 mL/min (ref >90)
Sodium: 139 mEq/L (ref 135–145)

## 2012-05-02 LAB — GLUCOSE, CAPILLARY
Glucose-Capillary: 116 mg/dL — ABNORMAL HIGH (ref 70–99)
Glucose-Capillary: 120 mg/dL — ABNORMAL HIGH (ref 70–99)
Glucose-Capillary: 93 mg/dL (ref 70–99)

## 2012-05-02 LAB — MAGNESIUM: Magnesium: 2.2 mg/dL (ref 1.5–2.5)

## 2012-05-02 LAB — URINE CULTURE: Colony Count: NO GROWTH

## 2012-05-02 LAB — PROCALCITONIN: Procalcitonin: 0.19 ng/mL

## 2012-05-02 MED ORDER — POTASSIUM PHOSPHATE DIBASIC 3 MMOLE/ML IV SOLN: 15.0000 mmol | Freq: Once | INTRAVENOUS | Status: DC

## 2012-05-02 MED ORDER — PANTOPRAZOLE SODIUM 40 MG IV SOLR
40.0000 mg | Freq: Every day | INTRAVENOUS | Status: DC
  Administered 2012-05-02 – 2012-05-03 (×2): 40 mg via INTRAVENOUS
  Filled 2012-05-02 (×3): qty 40

## 2012-05-02 MED ORDER — FENTANYL CITRATE 0.05 MG/ML IJ SOLN: 12.5000 ug | INTRAMUSCULAR | Status: DC | PRN

## 2012-05-02 MED ORDER — HYDRALAZINE HCL 20 MG/ML IJ SOLN
5.0000 mg | INTRAMUSCULAR | Status: DC | PRN
  Administered 2012-05-03 (×2): 10 mg via INTRAVENOUS
  Administered 2012-05-04: 5 mg via INTRAVENOUS
  Filled 2012-05-02 (×4): qty 1

## 2012-05-02 MED ORDER — ACETAMINOPHEN 650 MG RE SUPP: 650.0000 mg | RECTAL | Status: DC | PRN

## 2012-05-02 MED ORDER — POTASSIUM PHOSPHATE DIBASIC 3 MMOLE/ML IV SOLN
30.0000 mmol | Freq: Once | INTRAVENOUS | Status: AC
  Administered 2012-05-02: 30 mmol via INTRAVENOUS
  Filled 2012-05-02: qty 10

## 2012-05-02 NOTE — Progress Notes (Addendum)
PULMONARY  / CRITICAL CARE MEDICINE  Name: Wayne Buckley MRN: [id number] DOB: 1950-07-19    LOS: 4  REFERRING MD :  Jeraldine Loots (EDP)   CHIEF COMPLAINT:  AMS, vent     HISTORY OF PRESENT ILLNESS:  61 yo male with hx HTN, heavy ETOH presented 11/10 after being found unresponsive outside by wife. Unknown downtime - wife heard a sound about an hour before finding him.  Pt was out most of the night drinking (drinks daily, heavily on the weekends, unsure if he has even had withdrawal as he has never has a period of time without drinking) and when wife awoke in the am he was lying unresponsive on his back on the deck.  Wife started CPR although she was unsure if he was pulseless, on EMS arrival he had strong pulse and agonal respirations.  Did have laceration/hematoma to back of head and some cuts/scrapes on arms and legs.  Prior to admit was being treated for a "head cold" with amoxicillin but had been feeling better.  Poor GCS on arrival, intubated, se ETOH 121   BRIEF PATIENT DESCRIPTION:  61 yo male with hx HTN admitted 11/10 with AMS and SDH after fall.  Intubated for airway protection.    LEVEL OF CARE:  ICU PRIMARY SERVICE:  PCCM CONSULTANTS:  Neurosurgery  CODE STATUS: full DIET:  Npo  DVT Px:  SCD's  GI Px:  protonix    LINES / TUBES: ETT 11/10>>> PICC line RUE 04/30/12 >>  CULTURES: Results for orders placed during the hospital encounter of 04/28/12  MRSA PCR SCREENING     Status: Normal   Collection Time   04/28/12  6:06 PM      Component Value Range Status Comment   MRSA by PCR NEGATIVE  NEGATIVE Final   CULTURE, RESPIRATORY     Status: Normal   Collection Time   04/28/12 10:20 PM      Component Value Range Status Comment   Specimen Description TRACHEAL ASPIRATE   Final    Special Requests NONE   Final    Gram Stain     Final    Value: ABUNDANT WBC PRESENT, PREDOMINANTLY PMN     RARE SQUAMOUS EPITHELIAL CELLS PRESENT     FEW GRAM POSITIVE COCCI     IN PAIRS IN  CLUSTERS FEW GRAM NEGATIVE RODS   Culture Non-Pathogenic Oropharyngeal-type Flora Isolated.   Final    Report Status 05/01/2012 FINAL   Final   URINE CULTURE     Status: Normal   Collection Time   04/28/12 11:43 PM      Component Value Range Status Comment   Specimen Description URINE, RANDOM   Final    Special Requests NONE   Final    Culture  Setup Time 04/29/2012 00:22   Final    Colony Count NO GROWTH   Final    Culture NO GROWTH   Final    Report Status 04/30/2012 FINAL   Final   CULTURE, BLOOD (ROUTINE X 2)     Status: Normal (Preliminary result)   Collection Time   04/29/12  4:35 AM      Component Value Range Status Comment   Specimen Description BLOOD LEFT HAND   Final    Special Requests BOTTLES DRAWN AEROBIC ONLY 5CC   Final    Culture  Setup Time 04/29/2012 09:17   Final    Culture     Final    Value:  BLOOD CULTURE RECEIVED NO GROWTH TO DATE CULTURE WILL BE HELD FOR 5 DAYS BEFORE ISSUING A FINAL NEGATIVE REPORT   Report Status PENDING   Incomplete   CULTURE, BLOOD (ROUTINE X 2)     Status: Normal (Preliminary result)   Collection Time   04/29/12  4:45 AM      Component Value Range Status Comment   Specimen Description BLOOD LEFT ARM   Final    Special Requests BOTTLES DRAWN AEROBIC AND ANAEROBIC 5CC EACH   Final    Culture  Setup Time 04/29/2012 09:17   Final    Culture     Final    Value:        BLOOD CULTURE RECEIVED NO GROWTH TO DATE CULTURE WILL BE HELD FOR 5 DAYS BEFORE ISSUING A FINAL NEGATIVE REPORT   Report Status PENDING   Incomplete   CULTURE, RESPIRATORY     Status: Normal (Preliminary result)   Collection Time   04/30/12  5:20 PM      Component Value Range Status Comment   Specimen Description TRACHEAL ASPIRATE   Final    Special Requests Normal   Final    Gram Stain     Final    Value: MODERATE WBC PRESENT,BOTH PMN AND MONONUCLEAR     FEW SQUAMOUS EPITHELIAL CELLS PRESENT     NO ORGANISMS SEEN   Culture Culture reincubated for better growth    Final    Report Status PENDING   Incomplete   URINE CULTURE     Status: Normal   Collection Time   04/30/12  8:56 PM      Component Value Range Status Comment   Specimen Description URINE, CLEAN CATCH   Final    Special Requests NONE   Final    Culture  Setup Time 04/30/2012 21:37   Final    Colony Count NO GROWTH   Final    Culture NO GROWTH   Final    Report Status 05/02/2012 FINAL   Final      ANTIBIOTICS: Anti-infectives     Start     Dose/Rate Route Frequency Ordered Stop   04/30/12 1400  piperacillin-tazobactam (ZOSYN) IVPB 3.375 g       3.375 g 12.5 mL/hr over 240 Minutes Intravenous 3 times per day 04/30/12 1324     04/28/12 2359   ceFAZolin (ANCEF) IVPB 2 g/50 mL premix        2 g 100 mL/hr over 30 Minutes Intravenous 3 times per day 04/28/12 2343 04/29/12 2239   04/28/12 1506   bacitracin 50,000 Units in sodium chloride irrigation 0.9 % 500 mL irrigation  Status:  Discontinued          As needed 04/28/12 1513 04/28/12 1710           SIGNIFICANT EVENTS:  CT head 11/10>>> 1. Significant left frontal lobe intraparenchymal hematoma. 2. Subdural hematoma involving the left frontal, parietal regions as well as the falx and tentorium. 3. Subarachnoid blood within the left parietal region and occipital horn of the right lateral ventricle. 4. Left to right midline shift. 5. Suspect early uncal herniation.  04/29/12: Agitatedon diprivan with high BP. Surgery resulted in good hematoma evacuation. Niece at bedside  04/30/12: febrile. ? LLL PNA. STart zosyn. Agitated  05/01/12 - still febrile. Agitated on WUA. Not focusing; Ct head improved  SUBJECTIVE/OVERNIGHT/INTERVAL HX  - still febrile   - Moves only left side but awake, alert and focusing. Not agitated. Follows some commands. Calmer -  doing well on sBT - wife at bedside  VITAL SIGNS: Temp:  [98.1 F (36.7 C)-102.5 F (39.2 C)] 100.7 F (38.2 C) (11/14 0700) Pulse Rate:  [58-111] 71  (11/14 0845) Resp:   [0-23] 19  (11/14 0845) BP: (77-175)/(62-109) 160/81 mmHg (11/14 0845) SpO2:  [98 %-100 %] 100 % (11/14 0845) FiO2 (%):  [30 %-40 %] 40 % (11/14 0845) Weight:  [85.7 kg (188 lb 15 oz)] 85.7 kg (188 lb 15 oz) (11/14 0500)   PHYSICAL EXAMINATION: General:  wdwn male, NAD on vent in ER Neuro:  Off diprivan - left side moves, rt side no movement., RASS 0 to + 1 to occ follows commands. Tracking  + HEENT:  Mm dry, no JVD, Cardiovascular:  s1s2 irreg Lungs:  resps even non labored on vent, cta  Abdomen:  Soft, +bs Ext: warm and dry, no edema   Ct Head Wo Contrast  05/01/2012  *RADIOLOGY REPORT*  Clinical Data: Follow-up subdural, subarachnoid hemorrhage.  CT HEAD WITHOUT CONTRAST  Technique:  Contiguous axial images were obtained from the base of the skull through the vertex without contrast.  Comparison: 04/29/2012  Findings: Postoperative changes on the left.  Stable pneumocephalus.  Stable postoperative changes in the left frontal lobe.  Area of residual hemorrhage within the left frontal lobe is stable.  Stable slight left to right midline shift of approximately 4 mm.  Previously seen subarachnoid blood has resolved.  Area of hemorrhage in the left temporal lobe slightly smaller, 15 mm compared to 19 mm previously.  Intraventricular blood is stable. No hydrocephalus.  IMPRESSION: Postoperative changes from evacuation of left frontal hematoma. Pneumocephalus and residual left frontal hemorrhage are stable. Slight decrease in the size of the left temporal hemorrhage. Resolve subarachnoid blood.  Stable intraventricular blood.  No hydrocephalus.   Original Report Authenticated By: Charlett Nose, M.D.    Dg Chest Port 1 View  05/02/2012  *RADIOLOGY REPORT*  Clinical Data: Evaluate endotracheal tube  PORTABLE CHEST - 1 VIEW  Comparison: 05/01/2012; 04/30/2012; 04/29/2012  Findings: Grossly unchanged cardiac silhouette and mediastinal contours.  Stable positioning of support apparatus.  Grossly unchanged  left basilar/retrocardiac heterogeneous / consolidative opacity.  No definite pleural effusion or pneumothorax.  Unchanged bones.  IMPRESSION: 1.  Stable positioning of support apparatus.  No pneumothorax. 2.  Grossly unchanged left basilar/retrocardiac opacities, atelectasis versus infiltrate.   Original Report Authenticated By: Tacey Ruiz, MD    Dg Chest Port 1 View  05/01/2012  *RADIOLOGY REPORT*  Clinical Data: Ventilator.  PORTABLE CHEST - 1 VIEW  Comparison: 04/30/2012  Findings: Endotracheal tube and NG tube are unchanged.  Interval placement of right PICC line with the tip at the cavoatrial junction.  Increasing left base atelectasis.  Right lung is clear. Heart is borderline in size.  IMPRESSION: Increasing left basilar density, likely atelectasis.   Original Report Authenticated By: Charlett Nose, M.D.      DIAGNOSES: Principal Problem:  *Subdural hematoma, post-traumatic Active Problems:  Acute respiratory failure  Aspiration pneumonia   ASSESSMENT / PLAN: PULMONARY  Lab 04/29/12 0453 04/28/12 1258  PHART 7.429 7.277*  PCO2ART 33.0* 41.6  PO2ART 138.0* 50.0*  HCO3 21.5 19.4*  O2SAT 99.3 80.0    Ventilator Settings: Vent Mode:  [-] PSV;CPAP FiO2 (%):  [30 %-40 %] 40 % Set Rate:  [14 bmp] 14 bmp Vt Set:  [580 mL] 580 mL PEEP:  [5 cmH20] 5 cmH20 Pressure Support:  [5 cmH20-10 cmH20] 5 cmH20   ASSESSMENT: Acute VDRF -  r/t AMS/neuro event  On 05/02/2012: doing SBT &  meets extubation criteria due to improved cns issues   PLAN:   Extubate  CARDIOVASCULAR  Lab 05/01/12 0345 04/29/12 1012 04/28/12 1106  TROPONINI <0.30 -- --  CKTOTAL -- -- --  CKMB -- -- --  LATICACIDVEN -- 0.7 5.7*  PROBNP -- -- --  O2SATVEN -- -- --    ASSESSMENT:  Hx HTN   05/02/2012: BP is MAP 92 and sbp  156   PLAN:  Monitor , sbp < 160    RENAL  Lab 05/01/12 0345 04/30/12 0450 04/29/12 0430 04/28/12 1244  NA 135 137 136 --  K 3.9 3.4* 3.6 --  CL 103 106 103 --  CO2 24 24  23  --  BUN 11 9 9  --  CREATININE 0.50 0.49* 0.69 --  CALCIUM 8.0* 8.1* 7.8* --  MG 2.3 2.3 -- 2.2  PHOS 1.9* 1.4* -- 3.4   Intake/Output      11/13 0701 - 11/14 0700 11/14 0701 - 11/15 0700   I.V. (mL/kg) 1955.3 (22.8) 84.6 (1)   Other 165    NG/GT 755 30   IV Piggyback 200    Total Intake(mL/kg) 3075.3 (35.9) 114.6 (1.3)   Urine (mL/kg/hr) 3150 (1.5) 205   Drains 0    Total Output 3150 205   Net -74.7 -90.4          ASSESSMENT:   Hyponatremia at admit - resolved Lactic acidosis  At admit resolved  - on 05/01/12 - low phos, repleted   PLAN:   F/u chem    GASTROINTESTINAL  Lab 04/28/12 1105  AST 88*  ALT 42  ALKPHOS 60  PROT 7.9  ALBUMIN 4.3    ASSESSMENT:   Hx ETOH -se ETOH 121 UDS- THC + 04/29/12 - at risk for etoh wd  PLAN:   Int f/u LFTS Hold  tube feeds (started on  05/01/12) due to anticipated extubation  HEMATOLOGIC  Lab 05/01/12 0345 04/30/12 0450 04/29/12 0430 04/28/12 1105  HGB 9.9* 9.9* 11.6* --  HCT 28.5* 29.2* 33.5* --  PLT 208 204 248 --  INR -- -- -- 1.06  APTT -- -- -- --    ASSESSMENT:   Anemia of critical illness  PLAN:  SCD's for dvt proph in setting SDH  - PRBC for hgb </= 6.9gm%    - exceptions are   -  if ACS susepcted/confirmed then transfuse for hgb </= 8.0gm%,  or    -  If septic shock first 24h and scvo2 < 70% then transfuse for hgb </= 9.0gm%   - active bleeding with hemodynamic instability, then transfuse regardless of hemoglobin value   At at all times try to transfuse 1 unit prbc as possible with exception of active hemorrhage    INFECTIOUS  Lab 05/02/12 0500 05/01/12 0345 04/30/12 1500 04/30/12 0450 04/29/12 0430  PROCALCITON 0.19 0.53 0.53 -- --  WBC -- 11.6* -- 11.5* 10.9*       ASSESSMENT:   Leukocytosis - unknown etiology. CXR ess clear.   Recent "cold" on current abx    - 04/29/12 - febrile - 04/30/12 - febrile. STart zosyn 04/30/12 for possible HAP - 05/01/12 - PCT suggesting localized  infection with LLL PNA on CXR.  - 05/02/12 - despite improving PCT, Fever curve +  PLAN:   Await culture Empiric zosyn since 04/30/12     ENDOCRINE CBG  Lab 05/02/12 0555 05/02/12 0005 05/01/12 1749 05/01/12 1206 05/01/12 0539  GLUCAP  120* 116* 134* 125* 109*    ASSESSMENT:   No active issue   PLAN:   Monitor blood sugar on chem   NEUROLOGIC  ASSESSMENT:   AMS  Fall  SDH -- extensive with L->R shift    -  05/01/12: CT shows improvement. Exam improved but agitated. AT risk for etoh wd - 05/02/12: improved cns status  PLAN:   Dc diprivan and extubated Post extubtion, IF still agitated, use precedex    Wife updated D/w Dr Pennie Banter at bedside   CRITICAL CARE: The patient is critically ill with multiple organ systems failure and requires high complexity decision making for assessment and support, frequent evaluation and titration of therapies, application of advanced monitoring technologies and extensive interpretation of multiple databases. Critical Care Time devoted to patient care services described in this note is 35  minutes.    Dr. Kalman Shan, M.D., [facility].C.P Pulmonary and Critical Care Medicine Staff Physician [facility] [place] [facility] Pager: [phone], If no answer or between  15:00h - 7:00h: call [phone]  05/02/2012 9:33 AM

## 2012-05-02 NOTE — Progress Notes (Signed)
Patient ID: Wayne Buckley, male   DOB: Mar 03, 1951, 61 y.o.   MRN: [id number] Subjective: Patient reports intubated  Objective: Vital signs in last 24 hours: Temp:  [98.1 F (36.7 C)-102.5 F (39.2 C)] 100.7 F (38.2 C) (11/14 0700) Pulse Rate:  [58-111] 111  (11/14 0759) Resp:  [0-23] 17  (11/14 0759) BP: (77-175)/(62-99) 175/87 mmHg (11/14 0759) SpO2:  [98 %-100 %] 100 % (11/14 0759) FiO2 (%):  [30 %-40 %] 40 % (11/14 0759) Weight:  [85.7 kg (188 lb 15 oz)] 85.7 kg (188 lb 15 oz) (11/14 0500)  Intake/Output from previous day: 11/13 0701 - 11/14 0700 In: 3075.3 [I.V.:1955.3; NG/GT:755; IV Piggyback:200] Out: 3150 [Urine:3150] Intake/Output this shift: Total I/O In: 114.6 [I.V.:84.6; NG/GT:30] Out: 205 [Urine:205]  awake, alert. Probably following commands on left, minimal movement on right.   Lab Results:  [facility] 05/01/12 0345 04/30/12 0450  WBC 11.6* 11.5*  HGB 9.9* 9.9*  HCT 28.5* 29.2*  PLT 208 204   BMET  Basename 05/01/12 0345 04/30/12 0450  NA 135 137  K 3.9 3.4*  CL 103 106  CO2 24 24  GLUCOSE 110* 133*  BUN 11 9  CREATININE 0.50 0.49*  CALCIUM 8.0* 8.1*    Studies/Results: Ct Head Wo Contrast  05/01/2012  *RADIOLOGY REPORT*  Clinical Data: Follow-up subdural, subarachnoid hemorrhage.  CT HEAD WITHOUT CONTRAST  Technique:  Contiguous axial images were obtained from the base of the skull through the vertex without contrast.  Comparison: 04/29/2012  Findings: Postoperative changes on the left.  Stable pneumocephalus.  Stable postoperative changes in the left frontal lobe.  Area of residual hemorrhage within the left frontal lobe is stable.  Stable slight left to right midline shift of approximately 4 mm.  Previously seen subarachnoid blood has resolved.  Area of hemorrhage in the left temporal lobe slightly smaller, 15 mm compared to 19 mm previously.  Intraventricular blood is stable. No hydrocephalus.  IMPRESSION: Postoperative changes from evacuation of left  frontal hematoma. Pneumocephalus and residual left frontal hemorrhage are stable. Slight decrease in the size of the left temporal hemorrhage. Resolve subarachnoid blood.  Stable intraventricular blood.  No hydrocephalus.   Original Report Authenticated By: Charlett Nose, M.D.    Dg Chest Port 1 View  05/02/2012  *RADIOLOGY REPORT*  Clinical Data: Evaluate endotracheal tube  PORTABLE CHEST - 1 VIEW  Comparison: 05/01/2012; 04/30/2012; 04/29/2012  Findings: Grossly unchanged cardiac silhouette and mediastinal contours.  Stable positioning of support apparatus.  Grossly unchanged left basilar/retrocardiac heterogeneous / consolidative opacity.  No definite pleural effusion or pneumothorax.  Unchanged bones.  IMPRESSION: 1.  Stable positioning of support apparatus.  No pneumothorax. 2.  Grossly unchanged left basilar/retrocardiac opacities, atelectasis versus infiltrate.   Original Report Authenticated By: Tacey Ruiz, MD    Dg Chest Port 1 View  05/01/2012  *RADIOLOGY REPORT*  Clinical Data: Ventilator.  PORTABLE CHEST - 1 VIEW  Comparison: 04/30/2012  Findings: Endotracheal tube and NG tube are unchanged.  Interval placement of right PICC line with the tip at the cavoatrial junction.  Increasing left base atelectasis.  Right lung is clear. Heart is borderline in size.  IMPRESSION: Increasing left basilar density, likely atelectasis.   Original Report Authenticated By: Charlett Nose, M.D.     Assessment/Plan: Doing amazingly well POD 4. Already following commands. Will recheck CT am tomorrow.Ok to keep weaning towards extubation with this level of consciousness.  LOS: 4 days  As above   Reinaldo Meeker, MD 05/02/2012, 9:14 AM

## 2012-05-02 NOTE — Progress Notes (Signed)
Right upper extremity:  DVT noted in the axillary vein with superficial thrombus in the cephalic and basilic veins.  Left upper extremity:  No evidence of DVT or superficial thrombosis.  Bilateral lower extremity:  No evidence of DVT, superficial thrombosis, or Baker's Cyst.

## 2012-05-02 NOTE — Progress Notes (Signed)
Notified Dr. Darrick Penna regarding a two different pauses in the patients heartbeat within 5 minutes. The first pause was for approx 2.5 seconds and the second pause was approx 2 seconds. The patients HR has been sustaining the the 60's throughout the night. No new orders received. Will continue to monitor.

## 2012-05-02 NOTE — Progress Notes (Signed)
RUE dvt per RN where PICC is  D/w Dr Gerlene Fee No aspirin or heparin products for a nother week atleast Dc picc line SCD  Dr. Kalman Shan, M.D., [facility].C.P Pulmonary and Critical Care Medicine Staff Physician [facility] [place] [facility] Pager: [phone], If no answer or between  15:00h - 7:00h: call [phone]  05/02/2012 1:42 PM

## 2012-05-02 NOTE — Progress Notes (Signed)
eLink Physician-Brief Progress Note Patient Name: Wayne Buckley DOB: [birth year] MRN: [id number]  Date of Service  05/02/2012   HPI/Events of Note     eICU Interventions  K-phos repleted   Intervention Category Intermediate Interventions: Electrolyte abnormality - evaluation and management  ALVA,RAKESH V. 05/02/2012, 4:15 PM

## 2012-05-02 NOTE — Procedures (Signed)
Extubation Procedure Note  Patient Details:   Name: Wayne Buckley DOB: 1950/09/06 MRN: [id number]   Pt extubated to [facility] after successful SBT.  Pt able to vocalize and has strong cough.  Cuff leak audible.  NIF -20/FVC 1.9L  Pt tolerated well. Will continue to monitor.    Evaluation  O2 sats: stable throughout Complications: No apparent complications Patient did tolerate procedure well. Bilateral Breath Sounds: Rhonchi Suctioning: Oral;Airway Yes  Ambrielle Kington Apple 05/02/2012, 10:02 AM

## 2012-05-03 ENCOUNTER — Inpatient Hospital Stay (HOSPITAL_COMMUNITY): Payer: 59

## 2012-05-03 LAB — MAGNESIUM: Magnesium: 2.2 mg/dL (ref 1.5–2.5)

## 2012-05-03 LAB — CBC
MCH: 30 pg (ref 26.0–34.0)
MCHC: 33.3 g/dL (ref 30.0–36.0)
Platelets: 275 10*3/uL (ref 150–400)
RDW: 12.9 % (ref 11.5–15.5)

## 2012-05-03 LAB — GLUCOSE, CAPILLARY: Glucose-Capillary: 102 mg/dL — ABNORMAL HIGH (ref 70–99)

## 2012-05-03 LAB — PHOSPHORUS: Phosphorus: 2.7 mg/dL (ref 2.3–4.6)

## 2012-05-03 LAB — BASIC METABOLIC PANEL
Calcium: 8.6 mg/dL (ref 8.4–10.5)
GFR calc Af Amer: >90 mL/min (ref >90)
GFR calc non Af Amer: >90 mL/min (ref >90)
Glucose, Bld: 102 mg/dL — ABNORMAL HIGH (ref 70–99)
Potassium: 3.1 mEq/L — ABNORMAL LOW (ref 3.5–5.1)
Sodium: 140 mEq/L (ref 135–145)

## 2012-05-03 MED ORDER — ENSURE COMPLETE PO LIQD
237.0000 mL | Freq: Two times a day (BID) | ORAL | Status: DC
  Administered 2012-05-03 – 2012-05-07 (×6): 237 mL via ORAL

## 2012-05-03 MED ORDER — POTASSIUM CHLORIDE 20 MEQ/15ML (10%) PO LIQD
40.0000 meq | Freq: Once | ORAL | Status: AC
  Administered 2012-05-03: 40 meq
  Filled 2012-05-03: qty 30

## 2012-05-03 MED ORDER — DEXMEDETOMIDINE HCL IN NACL 200 MCG/50ML IV SOLN
0.2000 ug/kg/h | INTRAVENOUS | Status: AC
  Filled 2012-05-03: qty 50

## 2012-05-03 NOTE — Progress Notes (Signed)
ANTIBIOTIC CONSULT NOTE - FOLLOW UP  Pharmacy Consult for Zosyn Indication: rule out sepsis  No Known Allergies  Vital Signs: Temp: 98.8 F (37.1 C) (11/15 0700) Temp src: Axillary (11/15 0700) BP: 160/75 mmHg (11/15 1135) Pulse Rate: 72  (11/15 1100) Intake/Output from previous day: 11/14 0701 - 11/15 0700 In: 1481 [I.V.:729.8; NG/GT:30; IV Piggyback:651.3] Out: 3030 [Urine:3030] Intake/Output from this shift: Total I/O In: 105 [I.V.:80; IV Piggyback:25] Out: 325 [Urine:325]  Labs:  [facility] 05/03/12 0430 05/02/12 1420 05/01/12 0345  WBC 9.3 -- 11.6*  HGB 9.5* -- 9.9*  PLT 275 -- 208  LABCREA -- -- --  CREATININE 0.53 0.43* 0.50   Estimated Creatinine Clearance: 98.9 ml/min (by C-G formula based on Cr of 0.53).  Assessment: 61yom continues on zosyn for questionable sepsis. Still with intermittent fevers but fever curve improved. WBC and PCT both trending down. CXR shows unchanged opacities. Renal function has remained stable.  11/10 urine cx>> negative final 11/10 resp cx>> GPC, GNR>>normal flora 11/12 resp cx>>normal flora 11/11 blood cx>> ngtd 11/10-11 Ancef x 4 doses 11/12 Zosyn>>  Goal of Therapy:  Appropriate zosyn dosing  Plan:  1) Continue Zosyn 3.375g IV q8 2) Follow up stop date   Wayne Buckley 05/03/2012,11:41 AM

## 2012-05-03 NOTE — Progress Notes (Signed)
PULMONARY  / CRITICAL CARE MEDICINE  Name: Wayne Buckley MRN: [id number] DOB: 1951/05/05    LOS: 5 Date of admit 04/28/2012   REFERRING MD :  Wayne Buckley (EDP)   CHIEF COMPLAINT:  AMS, vent     HISTORY OF PRESENT ILLNESS:  61 yo male with hx HTN, heavy ETOH presented 11/10 after being found unresponsive outside by wife. Unknown downtime - wife heard a sound about an hour before finding him.  Pt was out most of the night drinking (drinks daily, heavily on the weekends, unsure if he has even had withdrawal as he has never has a period of time without drinking) and when wife awoke in the am he was lying unresponsive on his back on the deck.  Wife started CPR although she was unsure if he was pulseless, on EMS arrival he had strong pulse and agonal respirations.  Did have laceration/hematoma to back of head and some cuts/scrapes on arms and legs.  Prior to admit was being treated for a "head cold" with amoxicillin but had been feeling better.  Poor GCS on arrival, intubated, se ETOH 121   BRIEF PATIENT DESCRIPTION:  61 yo male with hx HTN admitted 11/10 with AMS and SDH after fall.  Intubated for airway protection.    LEVEL OF CARE:  ICU PRIMARY SERVICE:  PCCM CONSULTANTS:  Neurosurgery  CODE STATUS: full DIET:  Npo  DVT Px:  SCD's  (NEED TO CHECK WITH Dr Gerlene Fee on 05/06/12 if ok to start Heparin/Lovenox SQ for proph) GI Px:  protonix    LINES / TUBES: ETT 11/10>>> PICC line RUE 04/30/12 >>05/02/12 (RUE DVT +)  CULTURES: Results for orders placed during the hospital encounter of 04/28/12  MRSA PCR SCREENING     Status: Normal   Collection Time   04/28/12  6:06 PM      Component Value Range Status Comment   MRSA by PCR NEGATIVE  NEGATIVE Final   CULTURE, RESPIRATORY     Status: Normal   Collection Time   04/28/12 10:20 PM      Component Value Range Status Comment   Specimen Description TRACHEAL ASPIRATE   Final    Special Requests NONE   Final    Gram Stain     Final    Value: ABUNDANT WBC PRESENT, PREDOMINANTLY PMN     RARE SQUAMOUS EPITHELIAL CELLS PRESENT     FEW GRAM POSITIVE COCCI     IN PAIRS IN CLUSTERS FEW GRAM NEGATIVE RODS   Culture Non-Pathogenic Oropharyngeal-type Flora Isolated.   Final    Report Status 05/01/2012 FINAL   Final   URINE CULTURE     Status: Normal   Collection Time   04/28/12 11:43 PM      Component Value Range Status Comment   Specimen Description URINE, RANDOM   Final    Special Requests NONE   Final    Culture  Setup Time 04/29/2012 00:22   Final    Colony Count NO GROWTH   Final    Culture NO GROWTH   Final    Report Status 04/30/2012 FINAL   Final   CULTURE, BLOOD (ROUTINE X 2)     Status: Normal (Preliminary result)   Collection Time   04/29/12  4:35 AM      Component Value Range Status Comment   Specimen Description BLOOD LEFT HAND   Final    Special Requests BOTTLES DRAWN AEROBIC ONLY 5CC   Final    Culture  Setup Time  04/29/2012 09:17   Final    Culture     Final    Value:        BLOOD CULTURE RECEIVED NO GROWTH TO DATE CULTURE WILL BE HELD FOR 5 DAYS BEFORE ISSUING A FINAL NEGATIVE REPORT   Report Status PENDING   Incomplete   CULTURE, BLOOD (ROUTINE X 2)     Status: Normal (Preliminary result)   Collection Time   04/29/12  4:45 AM      Component Value Range Status Comment   Specimen Description BLOOD LEFT ARM   Final    Special Requests BOTTLES DRAWN AEROBIC AND ANAEROBIC 5CC EACH   Final    Culture  Setup Time 04/29/2012 09:17   Final    Culture     Final    Value:        BLOOD CULTURE RECEIVED NO GROWTH TO DATE CULTURE WILL BE HELD FOR 5 DAYS BEFORE ISSUING A FINAL NEGATIVE REPORT   Report Status PENDING   Incomplete   CULTURE, RESPIRATORY     Status: Normal   Collection Time   04/30/12  5:20 PM      Component Value Range Status Comment   Specimen Description TRACHEAL ASPIRATE   Final    Special Requests Normal   Final    Gram Stain     Final    Value: MODERATE WBC PRESENT,BOTH PMN AND MONONUCLEAR       FEW SQUAMOUS EPITHELIAL CELLS PRESENT     NO ORGANISMS SEEN   Culture Non-Pathogenic Oropharyngeal-type Flora Isolated.   Final    Report Status 05/03/2012 FINAL   Final   URINE CULTURE     Status: Normal   Collection Time   04/30/12  8:56 PM      Component Value Range Status Comment   Specimen Description URINE, CLEAN CATCH   Final    Special Requests NONE   Final    Culture  Setup Time 04/30/2012 21:37   Final    Colony Count NO GROWTH   Final    Culture NO GROWTH   Final    Report Status 05/02/2012 FINAL   Final      ANTIBIOTICS: Anti-infectives     Start     Dose/Rate Route Frequency Ordered Stop   04/30/12 1400  piperacillin-tazobactam (ZOSYN) IVPB 3.375 g       3.375 g 12.5 mL/hr over 240 Minutes Intravenous 3 times per day 04/30/12 1324     04/28/12 2359   ceFAZolin (ANCEF) IVPB 2 g/50 mL premix        2 g 100 mL/hr over 30 Minutes Intravenous 3 times per day 04/28/12 2343 04/29/12 2239   04/28/12 1506   bacitracin 50,000 Units in sodium chloride irrigation 0.9 % 500 mL irrigation  Status:  Discontinued          As needed 04/28/12 1513 04/28/12 1710           SIGNIFICANT EVENTS:  CT head 11/10>>> 1. Significant left frontal lobe intraparenchymal hematoma. 2. Subdural hematoma involving the left frontal, parietal regions as well as the falx and tentorium. 3. Subarachnoid blood within the left parietal region and occipital horn of the right lateral ventricle. 4. Left to right midline shift. 5. Suspect early uncal herniation.  04/29/12: Agitatedon diprivan with high BP. Surgery resulted in good hematoma evacuation. Niece at bedside  04/30/12: febrile. ? LLL PNA. STart zosyn. Agitated  05/01/12 - still febrile. Agitated on WUA. Not focusing; Ct head improved  05/02/12 - febrile, extubated. Has NEW RUE DVT (no heparin prdts for 1 weeks since date of surgery per Dr Beverlyn Roux)  SUBJECTIVE/OVERNIGHT/INTERVAL HX  - fever curve improved - maintaining airway post  extubation   - Moves only left side but awake, alert and focusing. BUT RASS +2. Needing constant reassurance. - wife at bedside - Wayne Buckley  VITAL SIGNS: Temp:  [98.7 F (37.1 C)-100.4 F (38 C)] 98.8 F (37.1 C) (11/15 0700) Pulse Rate:  [61-87] 73  (11/15 0700) Resp:  [18-24] 21  (11/15 0700) BP: (132-164)/(67-126) 132/85 mmHg (11/15 0600) SpO2:  [95 %-100 %] 100 % (11/15 0700) Weight:  [82.7 kg (182 lb 5.1 oz)] 82.7 kg (182 lb 5.1 oz) (11/15 0500)   PHYSICAL EXAMINATION: General:  wdwn male, NAD on vent in ER Neuro:   left side moves, rt side no movement., RASS +2 and pullnig at things constantly. Intact distant memory. Tracking  + HEENT:  Mm dry, no JVD, Cardiovascular:  s1s2 irreg Lungs:  resps even non labored on vent, cta  Abdomen:  Soft, +bs Ext: warm and dry, no edema   Ct Head Wo Contrast  05/03/2012  *RADIOLOGY REPORT*  Clinical Data: Follow-up head trauma  CT HEAD WITHOUT CONTRAST  Technique:  Contiguous axial images were obtained from the base of the skull through the vertex without contrast.  Comparison: 04/21/2012  Findings: No worrisome or unfavorable change since previous study. The patient has had left craniotomy for evacuation of subdural and intraparenchymal hematoma.  Low density persists in the left frontal lobe without evidence of additional bleeding.  Hemorrhagic contusion in the left temporal tip is unchanged.  Regional edema is unchanged.  Blood products dependent within the lateral ventricles appear the same there is no evidence of hydrocephalus.  Small amount of residual postoperative subdural fluid and air, without increased or additional hemorrhage.  Left to right midline shift of only 2.5 mm.  IMPRESSION: No significant or worrisome change.  Postoperative findings related to evacuation of a left frontal hemorrhagic contusion/hematoma. Residual hematoma in the left temporal tip is not larger.  No evidence of additional bleeding.  See above.   Original Report  Authenticated By: Paulina Fusi, M.D.    Dg Chest Port 1 View  05/02/2012  *RADIOLOGY REPORT*  Clinical Data: Evaluate endotracheal tube  PORTABLE CHEST - 1 VIEW  Comparison: 05/01/2012; 04/30/2012; 04/29/2012  Findings: Grossly unchanged cardiac silhouette and mediastinal contours.  Stable positioning of support apparatus.  Grossly unchanged left basilar/retrocardiac heterogeneous / consolidative opacity.  No definite pleural effusion or pneumothorax.  Unchanged bones.  IMPRESSION: 1.  Stable positioning of support apparatus.  No pneumothorax. 2.  Grossly unchanged left basilar/retrocardiac opacities, atelectasis versus infiltrate.   Original Report Authenticated By: Tacey Ruiz, MD      DIAGNOSES: Principal Problem:  *Subdural hematoma, post-traumatic Active Problems:  Acute respiratory failure  Aspiration pneumonia   ASSESSMENT / PLAN: PULMONARY  Lab 04/29/12 0453 04/28/12 1258  PHART 7.429 7.277*  PCO2ART 33.0* 41.6  PO2ART 138.0* 50.0*  HCO3 21.5 19.4*  O2SAT 99.3 80.0    Ventilator Settings:     ASSESSMENT: Acute VDRF - r/t AMS/neuro event  On 05/03/2012: maintaing airway well - s/p extubation 05/02/12.   PLAN:   Pulmonnary toilet  CARDIOVASCULAR  Lab 05/01/12 0345 04/29/12 1012 04/28/12 1106  TROPONINI <0.30 -- --  CKTOTAL -- -- --  CKMB -- -- --  LATICACIDVEN -- 0.7 5.7*  PROBNP -- -- --  O2SATVEN -- -- --  ASSESSMENT:  Hx HTN  - NEW RUE DVT 11/145/13 and PICC RUE dc'ed  05/03/2012: BP is MAP 97 and sbp  159   PLAN:  No asa orheparin atleast till 05/06/12 per Dr Gerlene Fee; then needs reassessment Monitor , sbp < 160 is goal per Dr Gerlene Fee    RENAL  Lab 05/03/12 0430 05/02/12 1420 05/01/12 0345  NA 140 139 135  K 3.1* 3.2* 3.9  CL 107 105 103  CO2 24 25 24   BUN 11 10 11   CREATININE 0.53 0.43* 0.50  CALCIUM 8.6 8.5 8.0*  MG 2.2 2.2 2.3  PHOS 2.7 2.0* 1.9*   Intake/Output      11/14 0701 - 11/15 0700 11/15 0701 - 11/16 0700   I.V.  (mL/kg) 729.8 (8.8)    Other 70    NG/GT 30    IV Piggyback 651.3    Total Intake(mL/kg) 1481 (17.9)    Urine (mL/kg/hr) 3030 (1.5) 325   Total Output 3030 325   Net -1549 -325          ASSESSMENT:   Hyponatremia at admit - resolved Lactic acidosis  At admit resolved  - on 05/03/12 - mild hypokalemia   PLAN:   Replete kcl 05/03/12 F/u chem    GASTROINTESTINAL  Lab 04/28/12 1105  AST 88*  ALT 42  ALKPHOS 60  PROT 7.9  ALBUMIN 4.3    ASSESSMENT:   Hx ETOH -se ETOH 121 UDS- THC + 04/29/12 - at risk for etoh wd  PLAN:   Int f/u LFTS Swallow eval pending 1115/13; NPO   HEMATOLOGIC  Lab 05/03/12 0430 05/01/12 0345 04/30/12 0450 04/28/12 1105  HGB 9.5* 9.9* 9.9* --  HCT 28.5* 28.5* 29.2* --  PLT 275 208 204 --  INR -- -- -- 1.06  APTT -- -- -- --    ASSESSMENT:   Anemia of critical illness  PLAN:  SCD's for dvt proph in setting SDH  - PRBC for hgb </= 6.9gm%    - exceptions are   -  if ACS susepcted/confirmed then transfuse for hgb </= 8.0gm%,  or    -  If septic shock first 24h and scvo2 < 70% then transfuse for hgb </= 9.0gm%   - active bleeding with hemodynamic instability, then transfuse regardless of hemoglobin value   At at all times try to transfuse 1 unit prbc as possible with exception of active hemorrhage    INFECTIOUS  Lab 05/03/12 0430 05/02/12 0500 05/01/12 0345 04/30/12 1500 04/30/12 0450  PROCALCITON -- 0.19 0.53 0.53 --  WBC 9.3 -- 11.6* -- 11.5*       ASSESSMENT:   Leukocytosis - unknown etiology. CXR ess clear.   Recent "cold" on current abx    - 04/29/12 - febrile - 04/30/12 - febrile. STart zosyn 04/30/12 for possible HAP - 05/01/12 - PCT suggesting localized infection with LLL PNA on CXR.  - 05/02/12 - despite improving PCT, Fever curve + - 05/03/12 - improving fever cuve  PLAN:   Await culture Empiric zosyn since 04/30/12; need to establish stop date     ENDOCRINE CBG  Lab 05/03/12 0548 05/03/12 0211  05/02/12 1204 05/02/12 0555 05/02/12 0005  GLUCAP 96 102* 93 120* 116*    ASSESSMENT:   No active issue   PLAN:   Monitor blood sugar on chem   NEUROLOGIC  ASSESSMENT:   AMS  Fall  SDH -- extensive with L->R shift    -  05/01/12: CT shows improvement. Exam improved  but agitated. AT risk for etoh wd - 05/02/12: improved cns status - 05/03/12:RASS + 2 and needing constant reassurance with wife at bedside. Though overall improved  PLAN:   STart precedex   Wife updated D/w Dr Pennie Banter at bedside   CRITICAL CARE: The patient is critically ill with multiple organ systems failure and requires high complexity decision making for assessment and support, frequent evaluation and titration of therapies, application of advanced monitoring technologies and extensive interpretation of multiple databases. Critical Care Time devoted to patient care services described in this note is 35  minutes.    Dr. Kalman Shan, M.D., [facility].C.P Pulmonary and Critical Care Medicine Staff Physician [facility] Pager: [phone], If no answer or between  15:00h - 7:00h: call [phone]  05/03/2012 10:07 AM

## 2012-05-03 NOTE — Progress Notes (Signed)
Spoke with Dr. Marchelle Gearing, pt currently calm. Will monitor for need to start Precedex.   Holly Bodily

## 2012-05-03 NOTE — Progress Notes (Signed)
Nutrition Follow-up  Intervention:   Ensure Complete po BID, each supplement provides 350 kcal and 13 grams of protein.  Assessment:   Pt is POD #5 for craniotomy, evacuation of SDH, evacuation of L frontal hemorrhage and partial L frontal lobectomy.  Pt extubated and seen by SLP this am. Spoke with SLP about eval.  Pt discussed in ICU rounds.   Diet Order:  Dysphagia 2 with Thin Liquids  Meds: Scheduled Meds:    . sodium chloride  1,000 mL Intravenous Once  . folic acid  1 mg Oral Daily  . pantoprazole (PROTONIX) IV  40 mg Intravenous QHS  . piperacillin-tazobactam (ZOSYN)  IV  3.375 g Intravenous Q8H  . [COMPLETED] potassium chloride  40 mEq Per Tube Once  . [COMPLETED] potassium phosphate IVPB (mmol)  30 mmol Intravenous Once  . sodium chloride  10-40 mL Intracatheter Q12H  . thiamine  100 mg Intravenous Daily  . [DISCONTINUED] antiseptic oral rinse  15 mL Mouth Rinse QID  . [DISCONTINUED] chlorhexidine  15 mL Mouth Rinse BID  . [DISCONTINUED] pantoprazole sodium  40 mg Per Tube QHS   Continuous Infusions:    . sodium chloride 20 mL/hr at 05/02/12 1122  . dexmedetomidine     PRN Meds:.sodium chloride, acetaminophen, fentaNYL, hydrALAZINE, labetalol, sodium chloride, [DISCONTINUED] acetaminophen (TYLENOL) oral liquid 160 mg/5 mL   CMP     Component Value Date/Time   NA 140 05/03/2012 0430   K 3.1* 05/03/2012 0430   CL 107 05/03/2012 0430   CO2 24 05/03/2012 0430   GLUCOSE 102* 05/03/2012 0430   BUN 11 05/03/2012 0430   CREATININE 0.53 05/03/2012 0430   CALCIUM 8.6 05/03/2012 0430   PROT 7.9 04/28/2012 1105   ALBUMIN 4.3 04/28/2012 1105   AST 88* 04/28/2012 1105   ALT 42 04/28/2012 1105   ALKPHOS 60 04/28/2012 1105   BILITOT 0.3 04/28/2012 1105   GFRNONAA >90 05/03/2012 0430   GFRAA >90 05/03/2012 0430   CBG (last 3)   Basename 05/03/12 0548 05/03/12 0211 05/02/12 1204  GLUCAP 96 102* 93   Sodium  Date/Time Value Range Status  05/03/2012  4:30 AM 140   135 - 145 mEq/L Final  05/02/2012  2:20 PM 139  135 - 145 mEq/L Final  05/01/2012  3:45 AM 135  135 - 145 mEq/L Final    Potassium  Date/Time Value Range Status  05/03/2012  4:30 AM 3.1* 3.5 - 5.1 mEq/L Final  05/02/2012  2:20 PM 3.2* 3.5 - 5.1 mEq/L Final     DELTA CHECK NOTED  05/01/2012  3:45 AM 3.9  3.5 - 5.1 mEq/L Final    Phosphorus  Date/Time Value Range Status  05/03/2012  4:30 AM 2.7  2.3 - 4.6 mg/dL Final  16/03/9603  5:40 PM 2.0* 2.3 - 4.6 mg/dL Final  98/04/9146  8:29 AM 1.9* 2.3 - 4.6 mg/dL Final    Magnesium  Date/Time Value Range Status  05/03/2012  4:30 AM 2.2  1.5 - 2.5 mg/dL Final  56/21/3086  5:78 PM 2.2  1.5 - 2.5 mg/dL Final  46/96/2952  8:41 AM 2.3  1.5 - 2.5 mg/dL Final    Intake/Output Summary (Last 24 hours) at 05/03/12 1223 Last data filed at 05/03/12 1200  Gross per 24 hour  Intake 1206.25 ml  Output   2825 ml  Net -1618.75 ml   Weight Status:  176 lb on admission 11/10 182 lb 11/15 196 lb 11/13 176 lb 11/11  Re-estimated needs:  1900-2100 kcal; 80-95  grams protein  Nutrition Dx:  Inadequate oral intake r/t inability to eat AEB NPO status; progressing  Goal: Pt to meet >/= 90% of their estimated nutrition needs; not met.  Monitor:  Diet/supplement tolerance, PO intake  Kendell Bane RD, LDN, CNSC [phone] Pager [phone] After Hours Pager

## 2012-05-03 NOTE — Evaluation (Signed)
Clinical/Bedside Swallow Evaluation Patient Details  Name: Wayne Buckley MRN: [id number] Date of Birth: [birth year]  Today's Date: 05/03/2012 Time: 1015-1040 SLP Time Calculation (min): 25 min  Past Medical History:  Past Medical History  Diagnosis Date  . Hypertension    Past Surgical History:  Past Surgical History  Procedure Date  . Craniotomy 04/28/2012    Procedure: CRANIOTOMY HEMATOMA EVACUATION SUBDURAL;  Surgeon: Wayne Meeker, MD;  Location: [facility] OR;  Service: Neurosurgery;  Laterality: Left;  Left Craniotomy for Subdural Hematoma   HPI:  61 yo male with hx HTN, heavy ETOH presented 11/10 after being found unresponsive outside by wife. Unknown downtime - wife heard a sound about an hour before finding him.  Pt was out most of the night drinking (drinks daily, heavily on the weekends, unsure if he has even had withdrawal as he has never has a period of time without drinking) and when wife awoke in the am he was lying unresponsive on his back on the deck.  Wife started CPR although she was unsure if he was pulseless, on EMS arrival he had strong pulse and agonal respirations.  Did have laceration/hematoma to back of head and some cuts/scrapes on arms and legs.  Prior to admit was being treated for a "head cold" with amoxicillin but had been feeling better.  Intubated 11/10-11/14. CT of head revealed left frontal intraparenchymal hemorrhage, SDH in left frontal/parietal lobes, SAH left parietal and occipital horn of the right lateral ventricle with left-right midline shift and suspected early uncal herniation. S/p craniotomy with evacuation of SDH, evacuation of left frontal hemorrage, and partial left frontal lobectomy.    Assessment / Plan / Recommendation Clinical Impression  Patient presents with a moderate oral phase dysphagia further exacerbated by cognitive deficits including poor attention. Patient with bilateral buccal pocketing of solids (right greater than left) with ability to  clear with max clinician cueing. Otherwise, no overt s/s of aspiration observed. Educated wife and RN on need to eliminate distractions during po intake and allow to self feed as able to increase safety with intake. Strongly recommend TBI team consult including PT, OT, and SLP consults for cognition.     Aspiration Risk  Moderate    Diet Recommendation Dysphagia 2 (Fine chop);Thin liquid   Liquid Administration via: Cup;Straw Medication Administration: Whole meds with puree Supervision: Patient able to self feed;Full supervision/cueing for compensatory strategies Compensations: Slow rate;Small sips/bites;Check for pocketing Postural Changes and/or Swallow Maneuvers: Seated upright 90 degrees    Other  Recommendations Oral Care Recommendations: Oral care BID   Follow Up Recommendations  Inpatient Rehab    Frequency and Duration min 2x/week  2 weeks   Pertinent Vitals/Pain n/a    SLP Swallow Goals Patient will utilize recommended strategies during swallow to increase swallowing safety with: Maximum assistance Swallow Study Goal #2 - Progress: Not met   Swallow Study    General HPI: 61 yo male with hx HTN, heavy ETOH presented 11/10 after being found unresponsive outside by wife. Unknown downtime - wife heard a sound about an hour before finding him.  Pt was out most of the night drinking (drinks daily, heavily on the weekends, unsure if he has even had withdrawal as he has never has a period of time without drinking) and when wife awoke in the am he was lying unresponsive on his back on the deck.  Wife started CPR although she was unsure if he was pulseless, on EMS arrival he had strong pulse and agonal  respirations.  Did have laceration/hematoma to back of head and some cuts/scrapes on arms and legs.  Prior to admit was being treated for a "head cold" with amoxicillin but had been feeling better.  Intubated 11/10-11/14. CT of head revealed left frontal intraparenchymal hemorrhage, SDH in  left frontal/parietal lobes, SAH left parietal and occipital horn of the right lateral ventricle with left-right midline shift and suspected early uncal herniation. S/p craniotomy with evacuation of SDH, evacuation of left frontal hemorrage, and partial left frontal lobectomy.  Type of Study: Bedside swallow evaluation Previous Swallow Assessment: none Diet Prior to this Study: NPO Temperature Spikes Noted: Yes Respiratory Status: Room air History of Recent Intubation: Yes Length of Intubations (days): 4 days Date extubated: 05/02/12 Behavior/Cognition: Alert;Cooperative;Impulsive;Distractible;Requires cueing;Decreased sustained attention (restless) Oral Cavity - Dentition: Adequate natural dentition Self-Feeding Abilities: Able to feed self;Needs assist Patient Positioning: Upright in bed Baseline Vocal Quality: Low vocal intensity;Hoarse (hypernasal initially, improved w/removal of palatal secretio) Volitional Cough: Strong Volitional Swallow: Able to elicit    Oral/Motor/Sensory Function Overall Oral Motor/Sensory Function: Impaired (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, oral apraxia)   Ice Chips Ice chips: Not tested   Thin Liquid Thin Liquid: Within functional limits Presentation: Cup;Self Fed;Straw    Nectar Thick Nectar Thick Liquid: Not tested   Honey Thick Honey Thick Liquid: Not tested   Puree Puree: Impaired Presentation: Spoon Oral Phase Impairments: Impaired anterior to posterior transit Oral Phase Functional Implications: Right anterior spillage;Left anterior spillage;Prolonged oral transit (right greater than left)   Solid   GO   Wayne Hellenbrand MA, CCC-SLP [phone]  Solid: Impaired Oral Phase Impairments: Impaired anterior to posterior transit Oral Phase Functional Implications: Oral residue (right greater than left pocketing)       Wayne Buckley Wayne Buckley 05/03/2012,11:40 AM

## 2012-05-03 NOTE — Progress Notes (Signed)
Patient ID: Wayne Buckley, male   DOB: 1950-10-05, 61 y.o.   MRN: [id number] Subjective: Patient reports feeling better  Objective: Vital signs in last 24 hours: Temp:  [98.7 F (37.1 C)-100.4 F (38 C)] 98.8 F (37.1 C) (11/15 0700) Pulse Rate:  [61-87] 73  (11/15 0700) Resp:  [18-24] 21  (11/15 0700) BP: (132-164)/(67-126) 132/85 mmHg (11/15 0600) SpO2:  [95 %-100 %] 100 % (11/15 0700) Weight:  [82.7 kg (182 lb 5.1 oz)] 82.7 kg (182 lb 5.1 oz) (11/15 0500)  Intake/Output from previous day: 11/14 0701 - 11/15 0700 In: 1481 [I.V.:729.8; NG/GT:30; IV Piggyback:651.3] Out: 3030 [Urine:3030] Intake/Output this shift: Total I/O In: -  Out: 325 [Urine:325]  awake, alert. Following commands. Verbalizing a bit.  Lab Results:  [facility] 05/03/12 0430 05/01/12 0345  WBC 9.3 11.6*  HGB 9.5* 9.9*  HCT 28.5* 28.5*  PLT 275 208   BMET  Basename 05/03/12 0430 05/02/12 1420  NA 140 139  K 3.1* 3.2*  CL 107 105  CO2 24 25  GLUCOSE 102* 112*  BUN 11 10  CREATININE 0.53 0.43*  CALCIUM 8.6 8.5    Studies/Results: Ct Head Wo Contrast  05/03/2012  *RADIOLOGY REPORT*  Clinical Data: Follow-up head trauma  CT HEAD WITHOUT CONTRAST  Technique:  Contiguous axial images were obtained from the base of the skull through the vertex without contrast.  Comparison: 04/21/2012  Findings: No worrisome or unfavorable change since previous study. The patient has had left craniotomy for evacuation of subdural and intraparenchymal hematoma.  Low density persists in the left frontal lobe without evidence of additional bleeding.  Hemorrhagic contusion in the left temporal tip is unchanged.  Regional edema is unchanged.  Blood products dependent within the lateral ventricles appear the same there is no evidence of hydrocephalus.  Small amount of residual postoperative subdural fluid and air, without increased or additional hemorrhage.  Left to right midline shift of only 2.5 mm.  IMPRESSION: No significant or  worrisome change.  Postoperative findings related to evacuation of a left frontal hemorrhagic contusion/hematoma. Residual hematoma in the left temporal tip is not larger.  No evidence of additional bleeding.  See above.   Original Report Authenticated By: Paulina Fusi, M.D.    Dg Chest Port 1 View  05/02/2012  *RADIOLOGY REPORT*  Clinical Data: Evaluate endotracheal tube  PORTABLE CHEST - 1 VIEW  Comparison: 05/01/2012; 04/30/2012; 04/29/2012  Findings: Grossly unchanged cardiac silhouette and mediastinal contours.  Stable positioning of support apparatus.  Grossly unchanged left basilar/retrocardiac heterogeneous / consolidative opacity.  No definite pleural effusion or pneumothorax.  Unchanged bones.  IMPRESSION: 1.  Stable positioning of support apparatus.  No pneumothorax. 2.  Grossly unchanged left basilar/retrocardiac opacities, atelectasis versus infiltrate.   Original Report Authenticated By: Tacey Ruiz, MD     Assessment/Plan: Steady improvement. Will get rehab to see him next week.  LOS: 5 days  as above   Reinaldo Meeker, MD 05/03/2012, 10:02 AM

## 2012-05-03 NOTE — Progress Notes (Signed)
Pt did not void post 7:45 AM foley removal. Bladder scan indicated >999 cc urine. Pt I&O catheterized per protocol. Will continue to monitor for spontaneous void.   Holly Bodily

## 2012-05-03 NOTE — Progress Notes (Signed)
UR completed 

## 2012-05-04 DIAGNOSIS — F1010 Alcohol abuse, uncomplicated: Secondary | ICD-10-CM

## 2012-05-04 DIAGNOSIS — E876 Hypokalemia: Secondary | ICD-10-CM

## 2012-05-04 DIAGNOSIS — G9340 Encephalopathy, unspecified: Secondary | ICD-10-CM

## 2012-05-04 DIAGNOSIS — R339 Retention of urine, unspecified: Secondary | ICD-10-CM

## 2012-05-04 LAB — GLUCOSE, CAPILLARY
Glucose-Capillary: 101 mg/dL — ABNORMAL HIGH (ref 70–99)
Glucose-Capillary: 106 mg/dL — ABNORMAL HIGH (ref 70–99)

## 2012-05-04 MED ORDER — POTASSIUM CHLORIDE CRYS ER 20 MEQ PO TBCR
40.0000 meq | EXTENDED_RELEASE_TABLET | Freq: Once | ORAL | Status: AC
  Administered 2012-05-04: 40 meq via ORAL
  Filled 2012-05-04: qty 2

## 2012-05-04 MED ORDER — PANTOPRAZOLE SODIUM 40 MG PO TBEC
40.0000 mg | DELAYED_RELEASE_TABLET | Freq: Every day | ORAL | Status: DC
  Administered 2012-05-04 – 2012-05-07 (×4): 40 mg via ORAL
  Filled 2012-05-04 (×4): qty 1

## 2012-05-04 MED ORDER — VITAMIN B-1 50 MG PO TABS
250.0000 mg | ORAL_TABLET | Freq: Every day | ORAL | Status: DC
  Administered 2012-05-05 – 2012-05-07 (×3): 250 mg via ORAL
  Filled 2012-05-04 (×3): qty 1

## 2012-05-04 NOTE — Progress Notes (Signed)
PULMONARY  / CRITICAL CARE MEDICINE  Name: Wayne Buckley MRN: [id number] DOB: 1951/06/17    LOS: 6 Date of admit 04/28/2012   REFERRING MD :  Jeraldine Loots (EDP)   CHIEF COMPLAINT:  AMS, vent   BRIEF PATIENT DESCRIPTION:  61 yo male with hx HTN, EtOH admitted 11/10 with AMS and SDH after fall.  Intubated for airway protection.    LEVEL OF CARE:  ICU PRIMARY SERVICE:  PCCM CONSULTANTS:  Neurosurgery  CODE STATUS: full DIET:  Npo  DVT Px:  SCD's   GI Px:  protonix   LINES / TUBES: ETT 11/10 >> 11/15 PICC line RUE 04/30/12 >>05/02/12 (RUE DVT +)  CULTURES: Results for orders placed during the hospital encounter of 04/28/12  MRSA PCR SCREENING     Status: Normal   Collection Time   04/28/12  6:06 PM      Component Value Range Status Comment   MRSA by PCR NEGATIVE  NEGATIVE Final   CULTURE, RESPIRATORY     Status: Normal   Collection Time   04/28/12 10:20 PM      Component Value Range Status Comment   Specimen Description TRACHEAL ASPIRATE   Final    Special Requests NONE   Final    Gram Stain     Final    Value: ABUNDANT WBC PRESENT, PREDOMINANTLY PMN     RARE SQUAMOUS EPITHELIAL CELLS PRESENT     FEW GRAM POSITIVE COCCI     IN PAIRS IN CLUSTERS FEW GRAM NEGATIVE RODS   Culture Non-Pathogenic Oropharyngeal-type Flora Isolated.   Final    Report Status 05/01/2012 FINAL   Final   URINE CULTURE     Status: Normal   Collection Time   04/28/12 11:43 PM      Component Value Range Status Comment   Specimen Description URINE, RANDOM   Final    Special Requests NONE   Final    Culture  Setup Time 04/29/2012 00:22   Final    Colony Count NO GROWTH   Final    Culture NO GROWTH   Final    Report Status 04/30/2012 FINAL   Final   CULTURE, BLOOD (ROUTINE X 2)     Status: Normal (Preliminary result)   Collection Time   04/29/12  4:35 AM      Component Value Range Status Comment   Specimen Description BLOOD LEFT HAND   Final    Special Requests BOTTLES DRAWN AEROBIC ONLY 5CC    Final    Culture  Setup Time 04/29/2012 09:17   Final    Culture     Final    Value:        BLOOD CULTURE RECEIVED NO GROWTH TO DATE CULTURE WILL BE HELD FOR 5 DAYS BEFORE ISSUING A FINAL NEGATIVE REPORT   Report Status PENDING   Incomplete   CULTURE, BLOOD (ROUTINE X 2)     Status: Normal (Preliminary result)   Collection Time   04/29/12  4:45 AM      Component Value Range Status Comment   Specimen Description BLOOD LEFT ARM   Final    Special Requests BOTTLES DRAWN AEROBIC AND ANAEROBIC 5CC EACH   Final    Culture  Setup Time 04/29/2012 09:17   Final    Culture     Final    Value:        BLOOD CULTURE RECEIVED NO GROWTH TO DATE CULTURE WILL BE HELD FOR 5 DAYS BEFORE ISSUING A FINAL NEGATIVE REPORT  Report Status PENDING   Incomplete   CULTURE, RESPIRATORY     Status: Normal   Collection Time   04/30/12  5:20 PM      Component Value Range Status Comment   Specimen Description TRACHEAL ASPIRATE   Final    Special Requests Normal   Final    Gram Stain     Final    Value: MODERATE WBC PRESENT,BOTH PMN AND MONONUCLEAR     FEW SQUAMOUS EPITHELIAL CELLS PRESENT     NO ORGANISMS SEEN   Culture Non-Pathogenic Oropharyngeal-type Flora Isolated.   Final    Report Status 05/03/2012 FINAL   Final   URINE CULTURE     Status: Normal   Collection Time   04/30/12  8:56 PM      Component Value Range Status Comment   Specimen Description URINE, CLEAN CATCH   Final    Special Requests NONE   Final    Culture  Setup Time 04/30/2012 21:37   Final    Colony Count NO GROWTH   Final    Culture NO GROWTH   Final    Report Status 05/02/2012 FINAL   Final      ANTIBIOTICS: Anti-infectives     Start     Dose/Rate Route Frequency Ordered Stop   04/30/12 1400   piperacillin-tazobactam (ZOSYN) IVPB 3.375 g  Status:  Discontinued        3.375 g 12.5 mL/hr over 240 Minutes Intravenous 3 times per day 04/30/12 1324 05/04/12 1116   04/28/12 2359   ceFAZolin (ANCEF) IVPB 2 g/50 mL premix        2  g 100 mL/hr over 30 Minutes Intravenous 3 times per day 04/28/12 2343 04/29/12 2239   04/28/12 1506   bacitracin 50,000 Units in sodium chloride irrigation 0.9 % 500 mL irrigation  Status:  Discontinued          As needed 04/28/12 1513 04/28/12 1710           SIGNIFICANT EVENTS:  CT head 11/10>>> 1. Significant left frontal lobe intraparenchymal hematoma. 2. Subdural hematoma involving the left frontal, parietal regions as well as the falx and tentorium. 3. Subarachnoid blood within the left parietal region and occipital horn of the right lateral ventricle. 4. Left to right midline shift. 5. Suspect early uncal herniation.  04/29/12: Agitated on diprivan with high BP. Surgery resulted in good hematoma evacuation. Niece at bedside  04/30/12: febrile. ? LLL PNA. STart zosyn. Agitated  05/01/12 - still febrile. Agitated on WUA. Not focusing; Ct head improved  05/02/12 - febrile, extubated. Has NEW RUE DVT (no heparin prdts for 1 weeks since date of surgery per Dr Beverlyn Roux)  SUBJECTIVE/OVERNIGHT/INTERVAL HX Did not tolerate foley removal > urinary retention. Poorly oriented and mildly agitated. + F/C  VITAL SIGNS: Temp:  [98 F (36.7 C)-100.1 F (37.8 C)] 98.1 F (36.7 C) (11/16 1228) Pulse Rate:  [61-89] 82  (11/16 1300) Resp:  [15-24] 17  (11/16 1300) BP: (133-187)/(63-133) 154/75 mmHg (11/16 1300) SpO2:  [96 %-100 %] 98 % (11/16 1300) Weight:  [83.9 kg (184 lb 15.5 oz)] 83.9 kg (184 lb 15.5 oz) (11/16 0500)   PHYSICAL EXAMINATION: General: RASS +1. + F/C.  Neuro: R hemiplegia and R facial droop HEENT:  Mm dry, no JVD Cardiovascular:  RRR s M Lungs:  Clear Abdomen:  Soft, +bs Ext: warm and dry, no edema   BMET    Component Value Date/Time   NA 140 05/03/2012 0430  K 3.1* 05/03/2012 0430   CL 107 05/03/2012 0430   CO2 24 05/03/2012 0430   GLUCOSE 102* 05/03/2012 0430   BUN 11 05/03/2012 0430   CREATININE 0.53 05/03/2012 0430   CALCIUM 8.6 05/03/2012 0430    GFRNONAA >90 05/03/2012 0430   GFRAA >90 05/03/2012 0430    CBC    Component Value Date/Time   WBC 9.3 05/03/2012 0430   RBC 3.17* 05/03/2012 0430   HGB 9.5* 05/03/2012 0430   HCT 28.5* 05/03/2012 0430   PLT 275 05/03/2012 0430   MCV 89.9 05/03/2012 0430   MCH 30.0 05/03/2012 0430   MCHC 33.3 05/03/2012 0430   RDW 12.9 05/03/2012 0430   LYMPHSABS 2.0 04/28/2012 1105   MONOABS 1.5* 04/28/2012 1105   EOSABS 0.1 04/28/2012 1105   BASOSABS 0.0 04/28/2012 1105    ABG    Component Value Date/Time   PHART 7.429 04/29/2012 0453   PCO2ART 33.0* 04/29/2012 0453   PO2ART 138.0* 04/29/2012 0453   HCO3 21.5 04/29/2012 0453   TCO2 22.5 04/29/2012 0453   ACIDBASEDEF 2.2* 04/29/2012 0453   O2SAT 99.3 04/29/2012 0453    CXR:   No new film   DIAGNOSES:  *Subdural hematoma, post-traumatic  Post op mgmt per NS  Altered MS  H/O EtOH - at risk for withdrawal syndrome  Acute respiratory failure  resolved  Concern for aspiration pneumonia  Little evidence of active bacterial process presently  PCT < 1 on repeated measurements  RUE DVT (PICC removed)  Anemia  Urinary retention  Failed attempt at Foley removal 11/15 Hypokalemia  PLAN: Cont ICU monitoring, consider SDU in next few days Cont PRN sedatives for EtOH W/D D/C CBGs - minimal hyperglycemia D/C Zosyn Replete K+, recheck BMET 11/17 Leave Foley cath in place for now  Billy Fischer, MD ; [facility] service [place] [phone].  After 5:30 PM or weekends, call [phone]

## 2012-05-04 NOTE — Progress Notes (Signed)
Patient ID: Wayne Buckley, male   DOB: [birth year], 61 y.o.   MRN: [id number] BP 149/68  Pulse 70  Temp 98 F (36.7 C) (Oral)  Resp 18  Ht 5\' 10"  (1.778 m)  Wt 83.9 kg (184 lb 15.5 oz)  BMI 26.54 kg/m2  SpO2 97% Alert, follows some commands, speech dysarthric non fluent perrl Right sided weakness, right facial droop Wound is clean dry and without signs of infection.  Continues to improve

## 2012-05-04 NOTE — Progress Notes (Signed)
Speech Language Pathology Dysphagia Treatment Patient Details Name: Wayne Buckley MRN: [id number] DOB: Sep 16, 1950 Today's Date: 05/04/2012 Time: 2440-1027 SLP Time Calculation (min): 15 min  Assessment / Plan / Recommendation Clinical Impression  Patient seen for dysphagia treatment  for diet tolerance of dysphagia 2 diet consistency and thin liquids following initial BSE completed on 05/03/12.  Patient  with eyes closed when SLP entered room but awakened with verbal and tactile cues.  Patient observed with RN administering  medication crushed in puree followed by sips of thin liquid by cup Coral Spikes.  No solids administered as patient with fluctuating LOA.  No outward s/s of aspiration noted. RN reports no difficulty with current diet.  Recommend to continued current diet consistency with full supervision  due to continued cognitive deficts .  ST to follow on 05/06/12 for cognitive evaluation and possible diet advancement.     Diet Recommendation  Continue with Current Diet: Dysphagia 2 (fine chop);Thin liquid    SLP Plan Continue with current plan of care      Swallowing Goals  SLP Swallowing Goals Patient will consume recommended diet without observed clinical signs of aspiration with: Maximum assistance Swallow Study Goal #1 - Progress: Progressing toward goal Swallow Study Goal #2 - Progress: Progressing toward goal  General Temperature Spikes Noted: No Respiratory Status: Room air Behavior/Cognition: Confused;Lethargic;Requires cueing Oral Cavity - Dentition: Adequate natural dentition Patient Positioning: Upright in bed  Oral Cavity - Oral Hygiene Brush patient's teeth BID with toothbrush (using toothpaste with fluoride): Yes   Dysphagia Treatment Treatment focused on: Skilled observation of diet tolerance;Patient/family/caregiver education;Facilitation of oral phase;Facilitation of pharyngeal phase Family/Caregiver Educated: Spouse Treatment Methods/Modalities: Skilled  observation;Differential diagnosis Patient observed directly with PO's: Yes Type of PO's observed: Dysphagia 1 (puree);Thin liquids Feeding: Needs assist Liquids provided via: Cup;Straw Oral Phase Signs & Symptoms: Prolonged oral phase Type of cueing: Verbal Amount of cueing: Moderate   GO    Moreen Fowler MS, CCC-SLP [phone] [facility] 05/04/2012, 5:22 PM

## 2012-05-05 LAB — GLUCOSE, CAPILLARY
Glucose-Capillary: 100 mg/dL — ABNORMAL HIGH (ref 70–99)
Glucose-Capillary: 110 mg/dL — ABNORMAL HIGH (ref 70–99)

## 2012-05-05 LAB — CULTURE, BLOOD (ROUTINE X 2)
Culture: NO GROWTH
Culture: NO GROWTH

## 2012-05-05 LAB — BASIC METABOLIC PANEL
Calcium: 8.7 mg/dL (ref 8.4–10.5)
GFR calc non Af Amer: >90 mL/min (ref >90)
Sodium: 137 mEq/L (ref 135–145)

## 2012-05-05 MED ORDER — FENTANYL CITRATE 0.05 MG/ML IJ SOLN: 12.5000 ug | INTRAMUSCULAR | Status: DC | PRN

## 2012-05-05 MED ORDER — POTASSIUM CHLORIDE CRYS ER 20 MEQ PO TBCR
40.0000 meq | EXTENDED_RELEASE_TABLET | Freq: Two times a day (BID) | ORAL | Status: AC
  Administered 2012-05-05 (×2): 40 meq via ORAL
  Filled 2012-05-05: qty 2

## 2012-05-05 MED ORDER — LABETALOL HCL 5 MG/ML IV SOLN
10.0000 mg | INTRAVENOUS | Status: DC | PRN
Start: 2012-05-05 — End: 2012-05-07

## 2012-05-05 MED ORDER — HYDRALAZINE HCL 20 MG/ML IJ SOLN
5.0000 mg | INTRAMUSCULAR | Status: DC | PRN
  Administered 2012-05-06: 10 mg via INTRAVENOUS

## 2012-05-05 NOTE — Progress Notes (Signed)
PULMONARY  / CRITICAL CARE MEDICINE  Name: Wayne Buckley MRN: [id number] DOB: 1950/07/01    LOS: 7 Date of admit 04/28/2012   REFERRING MD :  Jeraldine Loots (EDP)   CHIEF COMPLAINT:  AMS, vent   BRIEF PATIENT DESCRIPTION:  61 yo male with hx HTN, EtOH admitted 11/10 with AMS and SDH after fall.  Intubated for airway protection. Underwent L craniotomy 11/10 for SDH evacuation ([place])  SIGNIFICANT EVENTS:  11/10 CT head: Significant left frontal lobe intraparenchymal hematoma. L frontal SDH. L sided SAH. Left to right midline shift. Suspect early uncal herniation. 11/10 L craniotomy for SDH evacuation 11/11 CT head:Interval evacuation of the left frontal hematoma. Significant reduction in mass effect and midline shift, measuring approximately 3 mm 11/13 CT head: Postoperative changes from evacuation of left frontal hematoma.  Pneumocephalus and residual left frontal hemorrhage are stable. Slight decrease in the size of the left temporal hemorrhage. Resolve subarachnoid blood. Stable intraventricular blood. No hydrocephalus. 11/15 CT head: No significant or worrisome change 11/14 BUE, BLE venous dopplers: RUE DVT. No heparin per NS  LEVEL OF CARE:  ICU PRIMARY SERVICE:  PCCM CONSULTANTS:  Neurosurgery  CODE STATUS: full DIET:  Dys II started 11/17 DVT Px:  SCD's   GI Px:  protonix   LINES / TUBES: ETT 11/10 >> 11/15 PICC line RUE 11/12 >> 11/14  MICRO: MRSA PCR 11/10 >> NEG Urine 11/10 >> NEG Resp 11/10 >> NOF Blood 11/11 >> NEG Urine 11/12 >> NEG Resp 11/12 >> NOF   ABX: Zosyn 11/12 >> 11/16  SUBJECTIVE/OVERNIGHT/INTERVAL HX Remains poorly oriented and mildly agitated. + F/C  VITAL SIGNS: Temp:  [97.5 F (36.4 C)-99.8 F (37.7 C)] 97.9 F (36.6 C) (11/17 1200) Pulse Rate:  [58-93] 79  (11/17 1300) Resp:  [15-23] 21  (11/17 1000) BP: (123-168)/(30-108) 159/82 mmHg (11/17 1300) SpO2:  [95 %-99 %] 95 % (11/17 1300)   PHYSICAL EXAMINATION: General: RASS 0. + F/C.    Neuro: R hemiplegia and R facial droop HEENT:  Mm dry, no JVD Cardiovascular:  RRR s M Lungs:  Clear Abdomen:  Soft, +bs Ext: warm and dry, no edema   BMET    Component Value Date/Time   NA 137 05/05/2012 0500   K 3.3* 05/05/2012 0500   CL 105 05/05/2012 0500   CO2 20 05/05/2012 0500   GLUCOSE 100* 05/05/2012 0500   BUN 13 05/05/2012 0500   CREATININE 0.51 05/05/2012 0500   CALCIUM 8.7 05/05/2012 0500   GFRNONAA >90 05/05/2012 0500   GFRAA >90 05/05/2012 0500    CBC    Component Value Date/Time   WBC 9.3 05/03/2012 0430   RBC 3.17* 05/03/2012 0430   HGB 9.5* 05/03/2012 0430   HCT 28.5* 05/03/2012 0430   PLT 275 05/03/2012 0430   MCV 89.9 05/03/2012 0430   MCH 30.0 05/03/2012 0430   MCHC 33.3 05/03/2012 0430   RDW 12.9 05/03/2012 0430   LYMPHSABS 2.0 04/28/2012 1105   MONOABS 1.5* 04/28/2012 1105   EOSABS 0.1 04/28/2012 1105   BASOSABS 0.0 04/28/2012 1105    ABG    Component Value Date/Time   PHART 7.429 04/29/2012 0453   PCO2ART 33.0* 04/29/2012 0453   PO2ART 138.0* 04/29/2012 0453   HCO3 21.5 04/29/2012 0453   TCO2 22.5 04/29/2012 0453   ACIDBASEDEF 2.2* 04/29/2012 0453   O2SAT 99.3 04/29/2012 0453    CXR:   No new film   DIAGNOSES:  *Subdural hematoma, post-traumatic  Post op mgmt per NS  Altered MS - improving  H/O EtOH -  No evidence of withdrawal  Acute respiratory failure  resolved  Concern for aspiration pneumonia  Afebrile off abx  RUE DVT (PICC removed)  Need to re-address timing of therapeutic anticoagulation with NS  Anemia  No acute blood loss. No indication for PRBCs  Urinary retention  Failed attempt at Foley removal 11/15 Hypokalemia  PLAN: Transfer to SDU 11/17 Monitor for signs of EtOH W/D Need to re-address timing of therapeutic anticoagulation with NS Replete K+ Leave Foley cath in place for now  Billy Fischer, MD ; [facility] [phone].  After 5:30 PM or weekends, call [phone]

## 2012-05-05 NOTE — Evaluation (Signed)
Physical Therapy Evaluation Patient Details Name: Wayne Buckley MRN: [id number] DOB: 08/11/1950 Today's Date: 05/05/2012 Time: 4098-1191 PT Time Calculation (min): 39 min  PT Assessment / Plan / Recommendation Clinical Impression  Pt admitted following unknown period of unconsciousness with CPR by wife. CT of head revealed left frontal intraparenchymal hemorrhage, SDH in left frontal/parietal lobes, SAH left parietal and occipital horn of the right lateral ventricle with left-right midline shift and suspected early uncal herniation. S/p craniotomy with evacuation of SDH, evacuation of left frontal hemorrage, and partial left frontal lobectomy. 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 Pt will benefit from skilled PT in the acute care setting in order to improve strength, cognition, mobility and safety. TBI team consult pending.     PT Assessment  Patient needs continued PT services    Follow Up Recommendations  CIR;Supervision/Assistance - 24 hour    Does the patient have the potential to tolerate intense rehabilitation      Barriers to Discharge        Equipment Recommendations  Other (comment) (TBD)    Recommendations for Other Services Other (comment) (TBI team PT/OT/SLP)   Frequency Min 4X/week    Precautions / Restrictions Precautions Precautions: Fall Restrictions Weight Bearing Restrictions: No   Pertinent Vitals/Pain Pain 0/10      Mobility  Bed Mobility Bed Mobility: Supine to Sit;Sitting - Scoot to Edge of Bed;Sit to Supine Supine to Sit: 1: +2 Total assist Supine to Sit: Patient Percentage: 30% Sitting - Scoot to Edge of Bed: 1: +1 Total assist Sit to Supine: 1: +2 Total assist Sit to Supine: Patient Percentage: 20% Details for Bed Mobility Assistance: Cues for sequencing throughout transfer. Assistance through trunk and with LEs(R>L). Support secondary to pt pushing to the right during transfer.    Transfers Transfers: Not assessed    Shoulder Instructions     Exercises     PT Diagnosis: Hemiplegia dominant side;Difficulty walking  PT Problem List: Decreased strength;Decreased activity tolerance;Decreased balance;Decreased mobility;Decreased knowledge of use of DME;Decreased safety awareness;Decreased knowledge of precautions;Impaired sensation;Decreased cognition PT Treatment Interventions: DME instruction;Functional mobility training;Therapeutic activities;Therapeutic exercise;Balance training;Neuromuscular re-education;Cognitive remediation;Patient/family education   PT Goals Acute Rehab PT Goals PT Goal Formulation: With patient/family Time For Goal Achievement: 05/19/12 Potential to Achieve Goals: Fair Pt will go Supine/Side to Sit: with min assist PT Goal: Supine/Side to Sit - Progress: Goal set today Pt will Sit at [facility] of Bed: with min assist;3-5 min;with bilateral upper extremity support;Other (comment) (with no pushing) PT Goal: Sit at [facility] Of Bed - Progress: Goal set today Pt will go Sit to Supine/Side: with min assist PT Goal: Sit to Supine/Side - Progress: Goal set today Pt will go Sit to Stand: with max assist PT Goal: Sit to Stand - Progress: Goal set today Pt will go Stand to Sit: with max assist PT Goal: Stand to Sit - Progress: Goal set today Pt will Stand: with max assist;1 - 2 min;with bilateral upper extremity support PT Goal: Stand - Progress: Goal set today  Visit Information  Last PT Received On: 05/05/12 Assistance Needed: +1    Subjective Data  Patient Stated Goal: no stated goal   Prior Functioning  Home Living Lives With: Spouse;Daughter Available Help at Discharge: Family;Other (Comment) (wife works during the day) Type of Home: House Home Access: Stairs to enter [employer] of Steps: 3 Entrance Stairs-Rails: Right;Left;Can reach both Home Layout: Two level;Able to live on main level with bedroom/bathroom  Alternate Level  Stairs-Number of Steps: 12 Alternate Level Stairs-Rails: Right Bathroom Shower/Tub: Walk-in shower;Door (has both) Teacher, early years/pre: Yes How Accessible: Accessible via walker Home Adaptive Equipment: None Prior Function Level of Independence: Independent Able to Take Stairs?: Yes Driving: Yes Vocation: Part time employment Comments: landlord Communication Communication: Expressive difficulties Dominant Hand: Right    Cognition  Overall Cognitive Status: Impaired Area of Impairment: Attention;Memory;Following commands;Safety/judgement;Awareness of errors;Awareness of deficits;Problem solving Arousal/Alertness: Awake/alert Orientation Level: Disoriented to;Situation;Place Behavior During Session: [facility] for tasks performed Current Attention Level: Sustained Memory Deficits: unable to recall reason why he is here although he has been told by surgeons what happened, understands 75% of reason why Following Commands: Follows one step commands with increased time Safety/Judgement: Decreased safety judgement for tasks assessed;Decreased awareness of need for assistance Awareness of Errors: Assistance required to identify errors made;Assistance required to correct errors made Awareness of Deficits: unaware of right sided lean or weakness Problem Solving: unable to problem solve Cognition - Other Comments: increased time required to complete all tasks or answer questions    Extremity/Trunk Assessment Right Lower Extremity Assessment RLE ROM/Strength/Tone: Deficits RLE ROM/Strength/Tone Deficits: No witnessed AROM; PROM WFL RLE Sensation: Deficits RLE Sensation Deficits: decreased response Left Lower Extremity Assessment LLE ROM/Strength/Tone: Unable to fully assess;Due to impaired cognition;Within functional levels LLE Sensation: WFL - Light Touch   Balance Balance Balance Assessed: Yes Static Sitting Balance Static Sitting - Balance Support: Left upper  extremity supported;Feet supported Static Sitting - Level of Assistance: 2: Max assist Static Sitting - Comment/# of Minutes: Pt with heavy R lean and pushing with LUE. See below for dynamic balance to correct. Pt also with posterior lean requiring assist to maintain upright position Dynamic Sitting Balance Dynamic Sitting - Balance Support: Feet supported;Left upper extremity supported Dynamic Sitting - Level of Assistance: 1: +2 Total assist Dynamic Sitting - Balance Activities: Lateral lean/weight shifting;Forward lean/weight shifting;Other (comment) (leaning onto elbow on L) Dynamic Sitting - Comments: To compensate for right pushing, pt leaned on elbow for ~20 seconds prior to returning to neutral position. Pt able with max assist to lean forward/back controlled  End of Session PT - End of Session Activity Tolerance: Patient tolerated treatment well Patient left: in bed;with call bell/phone within reach;with family/visitor present;with nursing in room Nurse Communication: Mobility status  GP     Milana Kidney 05/05/2012, 3:33 PM  05/05/2012 Milana Kidney DPT PAGER: [phone] OFFICE: [phone]

## 2012-05-05 NOTE — Progress Notes (Signed)
Patient ID: Wayne Buckley, male   DOB: 1950/08/02, 61 y.o.   MRN: [id number] BP 135/30  Pulse 93  Temp 98.9 F (37.2 C) (Oral)  Resp 19  Ht 5\' 10"  (1.778 m)  Wt 83.9 kg (184 lb 15.5 oz)  BMI 26.54 kg/m2  SpO2 97% Alert and oriented to person. Receptive aphasia,  Perrl, full eom Right facial droop Right plegia, though he is moving the right side better than he was Wound is clean, dry, and without signs of infection.  Speech consult

## 2012-05-06 DIAGNOSIS — W19XXXA Unspecified fall, initial encounter: Secondary | ICD-10-CM

## 2012-05-06 DIAGNOSIS — S065X9A Traumatic subdural hemorrhage with loss of consciousness of unspecified duration, initial encounter: Secondary | ICD-10-CM

## 2012-05-06 LAB — BASIC METABOLIC PANEL
CO2: 22 mEq/L (ref 19–32)
Calcium: 8.9 mg/dL (ref 8.4–10.5)
Potassium: 4.3 mEq/L (ref 3.5–5.1)
Sodium: 136 mEq/L (ref 135–145)

## 2012-05-06 MED ORDER — TAMSULOSIN HCL 0.4 MG PO CAPS
0.4000 mg | ORAL_CAPSULE | Freq: Every day | ORAL | Status: DC
  Administered 2012-05-06 – 2012-05-07 (×2): 0.4 mg via ORAL
  Filled 2012-05-06 (×2): qty 1

## 2012-05-06 NOTE — Progress Notes (Signed)
Physical Therapy Treatment Patient Details Name: Wayne Buckley MRN: [id number] DOB: September 04, 1950 Today's Date: 05/06/2012 Time: 7829-5621 PT Time Calculation (min): 41 min  PT Assessment / Plan / Recommendation Comments on Treatment Session  Pt able to improve overall sitting balance with max cues and able to transfer to recliner however continues to push towards the right.  Pt continues to be limited due to cognitive impairments with focused attention and easily distracted to external environment.  Highly recommend inpatient rehab at d/c.    Follow Up Recommendations  CIR     Does the patient have the potential to tolerate intense rehabilitation     Barriers to Discharge        Equipment Recommendations       Recommendations for Other Services Rehab consult  Frequency Min 4X/week   Plan Discharge plan remains appropriate;Frequency remains appropriate    Precautions / Restrictions Precautions Precautions: Fall Restrictions Weight Bearing Restrictions: No   Pertinent Vitals/Pain No c/o pain    Mobility  Bed Mobility Bed Mobility: Supine to Sit;Sitting - Scoot to Edge of Bed;Sit to Supine Supine to Sit: 1: +2 Total assist Supine to Sit: Patient Percentage: 40% Sitting - Scoot to Edge of Bed: 1: +2 Total assist Sitting - Scoot to Edge of Bed: Patient Percentage: 40% Details for Bed Mobility Assistance: Max cues for sequencing and max cues to remain on task.  Pt needs step by step instructions and easily distracted.  (A) with LE OOB and (A) with trunk OOB. Transfers Transfers: Sit to Stand;Stand to Sit;Stand Pivot Transfers Sit to Stand: 1: +2 Total assist;From bed Sit to Stand: Patient Percentage: 40% Stand to Sit: 1: +2 Total assist;To chair/3-in-1 Stand to Sit: Patient Percentage: 40% Stand Pivot Transfers: 1: +2 Total assist Stand Pivot Transfers: Patient Percentage: 50% Details for Transfer Assistance: +2 (A) to maintain balance and initiate transfer and slowly descend  to recliner.  Pt with right LE knee buckling and continues to lean to right side during transfer.  Pt was trasnfer to right side due to heavy pusher towards the right and will resist trasnfer to left side. Ambulation/Gait Ambulation/Gait Assistance: Not tested (comment) Modified Rankin (Stroke Patients Only) Pre-Morbid Rankin Score: No symptoms Modified Rankin: Severe disability    Exercises     PT Diagnosis:    PT Problem List:   PT Treatment Interventions:     PT Goals Acute Rehab PT Goals PT Goal Formulation: With patient/family Time For Goal Achievement: 05/19/12 Potential to Achieve Goals: Fair Pt will go Supine/Side to Sit: with min assist PT Goal: Supine/Side to Sit - Progress: Progressing toward goal Pt will Sit at [facility] of Bed: with min assist;3-5 min;with bilateral upper extremity support;Other (comment) PT Goal: Sit at [facility] Of Bed - Progress: Progressing toward goal Pt will go Sit to Supine/Side: with min assist PT Goal: Sit to Supine/Side - Progress: Progressing toward goal Pt will go Sit to Stand: with max assist PT Goal: Sit to Stand - Progress: Progressing toward goal Pt will go Stand to Sit: with max assist PT Goal: Stand to Sit - Progress: Progressing toward goal Pt will Stand: with max assist;1 - 2 min;with bilateral upper extremity support PT Goal: Stand - Progress: Progressing toward goal  Visit Information  Last PT Received On: 05/06/12 Assistance Needed: +2    Subjective Data  Subjective: "Any who" Patient Stated Goal: no stated goal   Cognition  Overall Cognitive Status: Impaired Area of Impairment: Attention;Memory;Following commands;Safety/judgement;Awareness of errors;Awareness of deficits;Problem solving  Arousal/Alertness: Awake/alert Orientation Level: Place;Time;Situation Behavior During Session: Restless Current Attention Level: Focused Memory Deficits: unable to recall reason why he is here although he has been told by surgeons what  happened, understands 75% of reason why Following Commands: Follows one step commands with increased time Safety/Judgement: Decreased safety judgement for tasks assessed;Decreased awareness of need for assistance Awareness of Errors: Assistance required to identify errors made;Assistance required to correct errors made Awareness of Deficits: unaware of right sided lean or weakness (Pt able to notice right sided lean when looking in mirror) Problem Solving: unable to problem solve Cognition - Other Comments: increased time required to complete all tasks or answer questions    Balance  Balance Balance Assessed: Yes Static Sitting Balance Static Sitting - Balance Support: Feet supported;No upper extremity supported Static Sitting - Level of Assistance: 2: Max assist;5: Stand by assistance Static Sitting - Comment/# of Minutes: Initally max (A) due to right sided lean and pushing towards right side.  Pt able to advance to minguard (A) with sitting with tactile cues to lean to left side,  taking left sided support away and leaning pt onto left elbow.   Static Standing Balance Static Standing - Balance Support: No upper extremity supported Static Standing - Level of Assistance: 1: +2 Total assist Static Standing - Comment/# of Minutes: ~1 minute x 2 to maintain upright posture and promote midline.  Pt continue to lean to right side and able to self correct by looking in mirror.  End of Session PT - End of Session Equipment Utilized During Treatment: Gait belt Activity Tolerance: Patient tolerated treatment well Patient left: in chair;with call bell/phone within reach Nurse Communication: Mobility status   GP     Nickole Adamek 05/06/2012, 10:23 AM

## 2012-05-06 NOTE — Progress Notes (Signed)
Speech Language Pathology Dysphagia Treatment Patient Details Name: Wayne Buckley MRN: [id number] DOB: 04/07/1951 Today's Date: 05/06/2012 Time: 1410-1420 SLP Time Calculation (min): 10 min  Assessment / Plan / Recommendation Clinical Impression  F/u diet tolerance assessment revealed no overt s/s of aspiration with clinician provided po trials. SLP provided moderate verbal cues to decrease bite size during self feeding and to clear mild-moderate right sided buccal pocketing with utilization of lingual sweep. Overall, current diet continues to remain appropriate.     Diet Recommendation  Continue with Current Diet: Dysphagia 2 (fine chop);Thin liquid    SLP Plan Continue with current plan of care   Pertinent Vitals/Pain n/a   Swallowing Goals  SLP Swallowing Goals Patient will consume recommended diet without observed clinical signs of aspiration with: Maximum assistance Swallow Study Goal #1 - Progress: Progressing toward goal Patient will utilize recommended strategies during swallow to increase swallowing safety with: Maximum assistance Swallow Study Goal #2 - Progress: Progressing toward goal  General Temperature Spikes Noted: No Respiratory Status: Room air Behavior/Cognition: Confused;Requires cueing Oral Cavity - Dentition: Adequate natural dentition Patient Positioning: Upright in chair     Dysphagia Treatment Treatment focused on: Skilled observation of diet tolerance;Facilitation of oral phase Treatment Methods/Modalities: Skilled observation Patient observed directly with PO's: Yes Type of PO's observed: Dysphagia 2 (chopped);Thin liquids Feeding: Able to feed self;Needs assist (reminders to use left hand) Liquids provided via: Straw Oral Phase Signs & Symptoms: Prolonged oral phase;Right pocketing Type of cueing: Verbal Amount of cueing: Moderate   GO    Wayne Lango MA, CCC-SLP [phone]  Wayne Buckley 05/06/2012, 3:14 PM

## 2012-05-06 NOTE — Progress Notes (Signed)
I will follow up with pt and family tomorrow to plan admission to inpatient rehabilitation when felt medically ready. [phone]

## 2012-05-06 NOTE — Progress Notes (Signed)
PULMONARY  / CRITICAL CARE MEDICINE  Name: Wayne Buckley MRN: [id number] DOB: [birth year]    LOS: 8 Date of admit 04/28/2012   REFERRING MD :  Jeraldine Loots (EDP)   CHIEF COMPLAINT:  AMS, vent   BRIEF PATIENT DESCRIPTION:  61 yo male with hx HTN, EtOH admitted 11/10 with AMS and SDH after fall.  Intubated for airway protection. Underwent L craniotomy 11/10 for SDH evacuation ([place])  SIGNIFICANT EVENTS:  11/10 CT head: Significant left frontal lobe intraparenchymal hematoma. L frontal SDH. L sided SAH. Left to right midline shift. Suspect early uncal herniation. 11/10 L craniotomy for SDH evacuation 11/11 CT head:Interval evacuation of the left frontal hematoma. Significant reduction in mass effect and midline shift, measuring approximately 3 mm 11/13 CT head: Postoperative changes from evacuation of left frontal hematoma.  Pneumocephalus and residual left frontal hemorrhage are stable. Slight decrease in the size of the left temporal hemorrhage. Resolve subarachnoid blood. Stable intraventricular blood. No hydrocephalus. 11/15 CT head: No significant or worrisome change 11/14 BUE, BLE venous dopplers: RUE DVT. No heparin per NS  LEVEL OF CARE:  ICU PRIMARY SERVICE:  PCCM CONSULTANTS:  Neurosurgery  CODE STATUS: full DIET:  Dys II started 11/17 DVT Px:  SCD's   GI Px:  protonix   LINES / TUBES: ETT 11/10 >> 11/15 PICC line RUE 11/12 >> 11/14  MICRO: MRSA PCR 11/10 >> NEG Urine 11/10 >> NEG Resp 11/10 >> NOF Blood 11/11 >> NEG Urine 11/12 >> NEG Resp 11/12 >> NOF   ABX: Zosyn 11/12 >> 11/16  SUBJECTIVE/OVERNIGHT/INTERVAL HX Some confusion and agitation > pulled out his PIV this am Tolerates the modified diet, worked with PT today 11/18  VITAL SIGNS: Temp:  [97.9 F (36.6 C)-100.3 F (37.9 C)] 99.9 F (37.7 C) (11/18 0700) Pulse Rate:  [30-85] 30  (11/18 0800) Resp:  [12-24] 21  (11/18 0800) BP: (107-164)/(65-87) 147/86 mmHg (11/18 0800) SpO2:  [80 %-99 %] 80 %  (11/18 0800)    Intake/Output Summary (Last 24 hours) at 05/06/12 0901 Last data filed at 05/06/12 0800  Gross per 24 hour  Intake   1600 ml  Output   2065 ml  Net   -465 ml    PHYSICAL EXAMINATION: General: RASS 0. + F/C.  Neuro: R hemiplegia and R facial droop HEENT:  Mm dry, no JVD Cardiovascular:  RRR s M Lungs:  Clear Abdomen:  Soft, +bs Ext: warm and dry, no edema   BMET    Component Value Date/Time   NA 136 05/06/2012 0644   K 4.3 05/06/2012 0644   CL 104 05/06/2012 0644   CO2 22 05/06/2012 0644   GLUCOSE 101* 05/06/2012 0644   BUN 14 05/06/2012 0644   CREATININE 0.56 05/06/2012 0644   CALCIUM 8.9 05/06/2012 0644   GFRNONAA >90 05/06/2012 0644   GFRAA >90 05/06/2012 0644    CBC    Component Value Date/Time   WBC 9.3 05/03/2012 0430   RBC 3.17* 05/03/2012 0430   HGB 9.5* 05/03/2012 0430   HCT 28.5* 05/03/2012 0430   PLT 275 05/03/2012 0430   MCV 89.9 05/03/2012 0430   MCH 30.0 05/03/2012 0430   MCHC 33.3 05/03/2012 0430   RDW 12.9 05/03/2012 0430   LYMPHSABS 2.0 04/28/2012 1105   MONOABS 1.5* 04/28/2012 1105   EOSABS 0.1 04/28/2012 1105   BASOSABS 0.0 04/28/2012 1105    ABG    Component Value Date/Time   PHART 7.429 04/29/2012 0453   PCO2ART 33.0* 04/29/2012 0453  PO2ART 138.0* 04/29/2012 0453   HCO3 21.5 04/29/2012 0453   TCO2 22.5 04/29/2012 0453   ACIDBASEDEF 2.2* 04/29/2012 0453   O2SAT 99.3 04/29/2012 0453    CXR:   No new film   DIAGNOSES:  *Subdural hematoma, post-traumatic, s/p evacuation  Last CT scan 11/15 >> stable improvement compared with 11/3; no new bleeding  Post op mgmt per NS  Altered MS - improving  H/O EtOH -  No evidence of withdrawal yet, some episodic agitation. At high risk  Acute respiratory failure  resolved  Concern for aspiration pneumonia  Afebrile off abx  RUE DVT (PICC removed)  Need to re-address timing of therapeutic anticoagulation with NS >> nothing restarted yet as of 11/18  Anemia  No acute  blood loss. No indication for PRBCs  Urinary retention  Failed attempt at Foley removal 11/15 Hypokalemia > resolved  Follow BMP  PLAN: Transfer to SDU 11/17 >> waiting for bed Monitor for signs of EtOH W/D >> SDU bed would be preferred for this reason Will re-address timing of therapeutic anticoagulation with NS Follow BMP Leave Foley cath in place for now, will add alpha-agent on 11/18 for few days and then retry removal Inpatient rehab consult placed 11/18  Levy Pupa, MD, PhD 05/06/2012, 9:12 AM  Pulmonary and Critical Care [phone] or if no answer [phone]

## 2012-05-06 NOTE — Progress Notes (Signed)
Rehab Admissions Coordinator Note:  Patient was screened by Trish Mage for appropriateness for an Inpatient Acute Rehab Consult. Noted PT recommending inpatient rehab.   At this time, we are recommending Inpatient Rehab consult.  Trish Mage 05/06/2012, 8:52 AM  I can be reached at [phone].

## 2012-05-06 NOTE — Consult Note (Signed)
Physical Medicine and Rehabilitation Consult Reason for Consult: TBI Referring Physician:  Dr. Delton Coombes   HPI: Wayne Buckley is a 61 y.o. male with history of HTN, ETOH abuse who was found down and noted to be comatose at admission on 04/28/12. UDS + for THC and ETOH @121 . CT head with left frontal lobe IPH, left frontal SDH, L-SAH, left to right midline shift with suspicion of early uncal herniation. Patient intubated for airway protection and underwent L-crani for evacuation of SDH, left frontal hemorrhage and partial left frontal lobectomy the same day by Dr. Gerlene Fee. F/U CCT with resolution of SAH and stable post op changes without new bleeding. Started on zosyn for possible HAP on 11/12. Extubated on 11/14. Noted to have RUE axillary DVT on 11/14--no heparin or blood thinner for now per NS. PICC discontinued.  Therapies initiated. Patient has poor attention with distractibility, confusion and expressive deficits.  On D2, thin liquids due to bilateral pocketing. He continues with right sided weakness, right facial droop and pusher syndrome with right lean. MD, PT, ST recommending CIR for progression.   Review of Systems  Unable to perform ROS: language   Past Medical History  Diagnosis Date  . Hypertension    Past Surgical History  Procedure Date  . Craniotomy 04/28/2012    Procedure: CRANIOTOMY HEMATOMA EVACUATION SUBDURAL;  Surgeon: Reinaldo Meeker, MD;  Location: [facility] OR;  Service: Neurosurgery;  Laterality: Left;  Left Craniotomy for Subdural Hematoma   History reviewed. No pertinent family history.  Social History: Married. Works part time/owns properties. Per reports  he has never smoked. He has never used smokeless tobacco. Per reports that he drinks alcohol daily and heavily on the weekends. His drug history not on file.  Allergies: No Known Allergies  Medications Prior to Admission  Medication Sig Dispense Refill  . Ascorbic Acid (VITAMIN C) 1000 MG tablet Take 1,000 mg by mouth 2  (two) times daily.      Marland Kitchen aspirin 325 MG EC tablet Take 325 mg by mouth 2 (two) times daily.      . Liver Extract (LIVER PO) Take 1 tablet by mouth daily.      . Multiple Vitamin (MULTIVITAMIN WITH MINERALS) TABS Take 1 tablet by mouth daily.      Marland Kitchen OVER THE COUNTER MEDICATION Take 1 tablet by mouth daily. Supplement for vision.      . quinapril-hydrochlorothiazide (ACCURETIC) 20-12.5 MG per tablet Take 1 tablet by mouth every evening.        Home: Home Living Lives With: Spouse;Daughter Available Help at Discharge: Family;Other (Comment) (wife works during the day) Type of Home: House Home Access: Stairs to enter [employer] of Steps: 3 Entrance Stairs-Rails: Right;Left;Can reach both Home Layout: Two level;Able to live on main level with bedroom/bathroom Alternate Level Stairs-Number of Steps: 12 Alternate Level Stairs-Rails: Right Bathroom Shower/Tub: Walk-in shower;Door (has both) Teacher, early years/pre: Yes How Accessible: Accessible via walker Home Adaptive Equipment: None  Functional History: Prior Function Able to Take Stairs?: Yes Driving: Yes Vocation: Part time employment Comments: landlord Functional Status:  Mobility: Bed Mobility Bed Mobility: Supine to Sit;Sitting - Scoot to Edge of Bed;Sit to Supine Supine to Sit: 1: +2 Total assist Supine to Sit: Patient Percentage: 30% Sitting - Scoot to Edge of Bed: 1: +1 Total assist Sit to Supine: 1: +2 Total assist Sit to Supine: Patient Percentage: 20% Transfers Transfers: Not assessed      ADL:    Cognition: Cognition Arousal/Alertness: Awake/alert  Orientation Level: Oriented to person;Disoriented to place;Disoriented to time;Disoriented to situation Cognition Overall Cognitive Status: Impaired Area of Impairment: Attention;Memory;Following commands;Safety/judgement;Awareness of errors;Awareness of deficits;Problem solving Arousal/Alertness: Awake/alert Orientation  Level: Disoriented to;Situation;Place Behavior During Session: [facility] for tasks performed Current Attention Level: Sustained Memory Deficits: unable to recall reason why he is here although he has been told by surgeons what happened, understands 75% of reason why Following Commands: Follows one step commands with increased time Safety/Judgement: Decreased safety judgement for tasks assessed;Decreased awareness of need for assistance Awareness of Errors: Assistance required to identify errors made;Assistance required to correct errors made Awareness of Deficits: unaware of right sided lean or weakness Problem Solving: unable to problem solve Cognition - Other Comments: increased time required to complete all tasks or answer questions  Blood pressure 147/86, pulse 30, temperature 99.9 F (37.7 C), temperature source Oral, resp. rate 21, height 5\' 10"  (1.778 m), weight 83.9 kg (184 lb 15.5 oz), SpO2 80.00%. Physical Exam  Nursing note and vitals reviewed. Constitutional: He appears well-developed and well-nourished.  HENT:  Head: Normocephalic.       Left crani incision with staples intact and dry crusted blood.  Eyes: Pupils are equal, round, and reactive to light.  Neck: Normal range of motion. Neck supple.  Cardiovascular: Normal rate and regular rhythm.   Pulmonary/Chest: Effort normal and breath sounds normal.  Abdominal: Soft. Bowel sounds are normal.  Musculoskeletal: He exhibits no edema.  Neurological: He is alert.       Oriented to self. Place and situation with cues--expressive aphasia noted. Very alert Easily distracted but able to follow simple one step commands. Right sided weakness noted with right inattention. 0/5 on the right side. He does have a flexor response to pain.  ?Motor apraxia.     Skin:       Large left crani incision. Well approximated  Psychiatric: He has a normal mood and affect. His behavior is normal. Judgment and thought content normal.    Results for  orders placed during the hospital encounter of 04/28/12 (from the past 24 hour(s))  BASIC METABOLIC PANEL     Status: Abnormal   Collection Time   05/06/12  6:44 AM      Component Value Range   Sodium 136  135 - 145 mEq/L   Potassium 4.3  3.5 - 5.1 mEq/L   Chloride 104  96 - 112 mEq/L   CO2 22  19 - 32 mEq/L   Glucose, Bld 101 (*) 70 - 99 mg/dL   BUN 14  6 - 23 mg/dL   Creatinine, Ser 1.61  0.50 - 1.35 mg/dL   Calcium 8.9  8.4 - 09.6 mg/dL   GFR calc non Af Amer >90  >90 mL/min   GFR calc Af Amer >90  >90 mL/min   No results found.  Assessment/Plan: Diagnosis: Right SDH s/p lobectomy, crani 1. Does the need for close, 24 hr/day medical supervision in concert with the patient's rehab needs make it unreasonable for this patient to be served in a less intensive setting? Yes 2. Co-Morbidities requiring supervision/potential complications: htn, etoh abuse 3. Due to bladder management, bowel management, safety, skin/wound care, disease management, medication administration, pain management and patient education, does the patient require 24 hr/day rehab nursing? Yes 4. Does the patient require coordinated care of a physician, rehab nurse, PT (1-2 hrs/day, 5 days/week), OT (1-2 hrs/day, 5 days/week) and SLP (1-2 hrs/day, 5 days/week) to address physical and functional deficits in the context of the above  medical diagnosis(es)? Yes Addressing deficits in the following areas: balance, endurance, locomotion, strength, transferring, bowel/bladder control, bathing, dressing, feeding, grooming, toileting, cognition, speech, language, swallowing and psychosocial support 5. Can the patient actively participate in an intensive therapy program of at least 3 hrs of therapy per day at least 5 days per week? Yes 6. The potential for patient to make measurable gains while on inpatient rehab is excellent 7. Anticipated functional outcomes upon discharge from inpatient rehab are min assist with PT, min to mod  with OT, min to mod assist with SLP. 8. Estimated rehab length of stay to reach the above functional goals is: 3-4 weeks 9. Does the patient have adequate social supports to accommodate these discharge functional goals? Yes and Potentially 10. Anticipated D/C setting: Home 11. Anticipated post D/C treatments: HH therapy 12. Overall Rehab/Functional Prognosis: excellent  RECOMMENDATIONS: This patient's condition is appropriate for continued rehabilitative care in the following setting: CIR Patient has agreed to participate in recommended program. Potentially Note that insurance prior authorization may be required for reimbursement for recommended care.  Comment:Rehab RN to follow up.   Ivory Broad, MD     05/06/2012

## 2012-05-06 NOTE — Progress Notes (Signed)
UR completed 

## 2012-05-06 NOTE — Evaluation (Signed)
Speech Language Pathology Evaluation Patient Details Name: Wayne Buckley MRN: [id number] DOB: 10-Jan-1951 Today's Date: 05/06/2012 Time: 1420-1440 SLP Time Calculation (min): 20 min  Problem List:  Patient Active Problem List  Diagnosis  . Acute respiratory failure  . Subdural hematoma, post-traumatic  . Aspiration pneumonia  . Alcohol abuse  . Hypokalemia  . Urinary retention  . Encephalopathy acute   Past Medical History:  Past Medical History  Diagnosis Date  . Hypertension    Past Surgical History:  Past Surgical History  Procedure Date  . Craniotomy 04/28/2012    Procedure: CRANIOTOMY HEMATOMA EVACUATION SUBDURAL;  Surgeon: Reinaldo Meeker, MD;  Location: [facility] OR;  Service: Neurosurgery;  Laterality: Left;  Left Craniotomy for Subdural Hematoma   HPI:  61 yo male with hx HTN, heavy ETOH presented 11/10 after being found unresponsive outside by wife. Unknown downtime - wife heard a sound about an hour before finding him.  Pt was out most of the night drinking (drinks daily, heavily on the weekends, unsure if he has even had withdrawal as he has never has a period of time without drinking) and when wife awoke in the am he was lying unresponsive on his back on the deck.  Wife started CPR although she was unsure if he was pulseless, on EMS arrival he had strong pulse and agonal respirations.  Did have laceration/hematoma to back of head and some cuts/scrapes on arms and legs.  Prior to admit was being treated for a "head cold" with amoxicillin but had been feeling better.  Intubated 11/10-11/14. CT of head revealed left frontal intraparenchymal hemorrhage, SDH in left frontal/parietal lobes, SAH left parietal and occipital horn of the right lateral ventricle with left-right midline shift and suspected early uncal herniation. S/p craniotomy with evacuation of SDH, evacuation of left frontal hemorrage, and partial left frontal lobectomy.    Assessment / Plan / Recommendation Clinical  Impression  Cognitive-linguistic evaluation complete. Patient presents with behaviors consistent with a Rancho Level V (confused, inappropriate) with emerging behaviors of a Rancho Level VI (confused, appropriate) with global aphasia further exacerbating communication skills. SLP will continue to f/u in the acute care setting. Recommend CIR after d.c to maximize functional recovery of cognitive skills.     SLP Assessment  Patient needs continued Speech Lanaguage Pathology Services    Follow Up Recommendations  Inpatient Rehab    Frequency and Duration min 3x week  2 weeks   Pertinent Vitals/Pain n/a   SLP Goals  SLP Goals Potential to Achieve Goals: Good Progress/Goals/Alternative treatment plan discussed with pt/caregiver and they: No caregivers available SLP Goal #1: Patient will sustain attention to basic functional ADL with min clinician cues SLP Goal #1 - Progress: Not met SLP Goal #2: Patient will name common objects related to functional ADLs with min phonemic cues SLP Goal #2 - Progress: Not met SLP Goal #3: Patient will answer orientation questions x 3 with max verbal cues to utilize external aids.  SLP Goal #3 - Progress: Not met SLP Goal #4: Patient will present with at least 3 behaviors of a Rancho Level VI.  SLP Goal #4 - Progress: Not met  SLP Evaluation Prior Functioning  Cognitive/Linguistic Baseline: Within functional limits Type of Home: House Lives With: Spouse;Daughter Available Help at Discharge: Family;Other (Comment) Vocation: Part time employment   Cognition  Overall Cognitive Status: Impaired Arousal/Alertness: Awake/alert Orientation Level: Oriented to person;Disoriented to place;Disoriented to time;Disoriented to situation Attention: Focused;Sustained Focused Attention: Appears intact Sustained Attention: Impaired Sustained Attention  Impairment: Verbal complex;Functional complex;Functional basic;Verbal basic Memory: Impaired Memory Impairment:  Storage deficit;Retrieval deficit;Decreased recall of new information;Decreased short term memory Decreased Short Term Memory: Verbal basic;Functional basic Awareness: Impaired Awareness Impairment: Intellectual impairment Problem Solving: Impaired Problem Solving Impairment: Verbal complex;Functional complex Behaviors: Impulsive Safety/Judgment: Impaired Rancho Mirant Scales of Cognitive Functioning: Confused/inappropriate/non-agitated (emerging behaviors of a Rancho Level VI)    Comprehension  Auditory Comprehension Overall Auditory Comprehension: Impaired Yes/No Questions: Within Functional Limits Commands: Impaired One Step Basic Commands: 75-100% accurate Two Step Basic Commands: 50-74% accurate Conversation: Simple Interfering Components: Attention EffectiveTechniques: Extra processing time;Visual/Gestural cues;Repetition Reading Comprehension Reading Status: Not tested    Expression Expression Primary Mode of Expression: Verbal Verbal Expression Overall Verbal Expression: Impaired Initiation: No impairment Automatic Speech: Name;Social Response Level of Generative/Spontaneous Verbalization: Sentence Repetition: No impairment Naming: Impairment Responsive: 51-75% accurate Confrontation: Impaired Convergent: 50-74% accurate Divergent: 50-74% accurate Verbal Errors: Perseveration (periodically aware of errors) Pragmatics: Impairment Impairments: Abnormal affect (intermittent laughing, giggling) Interfering Components: Attention Effective Techniques: Phonemic cues Written Expression Dominant Hand: Right Written Expression: Not tested   Oral / Motor Oral Motor/Sensory Function Overall Oral Motor/Sensory Function: Impaired (see bedside swallow evaluation) Motor Speech Overall Motor Speech: Appears within functional limits for tasks assessed   GO   [name] MA, CCC-SLP [phone]   Charnele Semple [name] 05/06/2012, 3:05 PM

## 2012-05-06 NOTE — Evaluation (Signed)
Occupational Therapy Evaluation Patient Details Name: Wayne Buckley MRN: [id number] DOB: 06/13/1951 Today's Date: 05/06/2012 Time: 7829-5621 OT Time Calculation (min): 41 min  OT Assessment / Plan / Recommendation Clinical Impression  Pt admitted following unknown period of unconsciousness with CPR by wife. CT of head revealed left frontal intraparenchymal hemorrhage, SDH in left frontal/parietal lobes, SAH left parietal and occipital horn of the right lateral ventricle with left-right midline shift and suspected early uncal herniation. S/p craniotomy with evacuation of SDH, evacuation of left frontal hemorrage, and partial left frontal lobectomy. Pt currently presenting with right pusher syndrome as well as R inattention and decreased cognition. Pt will benefit from skilled OT in the acute setting followed by CIR to maximize I with ADL and ADL mobility prior to d/c    OT Assessment  Patient needs continued OT Services    Follow Up Recommendations  CIR    Barriers to Discharge      Equipment Recommendations   (TBD)    Recommendations for Other Services Rehab consult  Frequency  Min 2X/week    Precautions / Restrictions Precautions Precautions: Fall Restrictions Weight Bearing Restrictions: No   Pertinent Vitals/Pain Pt indicated head discomfort/pain but did not rate. RN aware.    ADL  Grooming: Maximal assistance Where Assessed - Grooming: Supported sitting Lower Body Dressing: +2 Total assistance Where Assessed - Lower Body Dressing: Supported sit to Pharmacist, hospital: +2 Total assistance Toilet Transfer: Patient Percentage: 50% Statistician Method: Surveyor, minerals:  (bed to chair) Toileting - Clothing Manipulation and Hygiene: +1 Total assistance Where Assessed - Toileting Clothing Manipulation and Hygiene: Sit to stand from 3-in-1 or toilet Equipment Used: Gait belt Transfers/Ambulation Related to ADLs: +2 total (pt=40/50%) sit to stand and  stand pivot from EOB to chair    OT Diagnosis: Generalized weakness;Acute pain;Cognitive deficits;Disturbance of vision  OT Problem List: Decreased strength;Decreased activity tolerance;Impaired balance (sitting and/or standing);Decreased cognition;Decreased coordination;Impaired vision/perception;Decreased safety awareness;Decreased knowledge of use of DME or AE;Decreased knowledge of precautions;Cardiopulmonary status limiting activity;Pain;Impaired UE functional use OT Treatment Interventions: Self-care/ADL training;DME and/or AE instruction;Neuromuscular education;Therapeutic activities;Cognitive remediation/compensation;Visual/perceptual remediation/compensation;Patient/family education;Balance training   OT Goals Acute Rehab OT Goals OT Goal Formulation: With patient Time For Goal Achievement: 05/20/12 Potential to Achieve Goals: Good ADL Goals Pt Will Perform Grooming: with modified independence;Standing at sink;Supported ADL Goal: Grooming - Progress: Goal set today Pt Will Perform Upper Body Dressing: with set-up;Sitting, bed;Sitting, chair ADL Goal: Upper Body Dressing - Progress: Goal set today Pt Will Perform Lower Body Dressing: with min assist;Sit to stand from chair;Sit to stand from bed ADL Goal: Lower Body Dressing - Progress: Goal set today Pt Will Transfer to Toilet: with min assist;Ambulation;with DME ADL Goal: Toilet Transfer - Progress: Goal set today Pt Will Perform Toileting - Clothing Manipulation: with min assist;Standing ADL Goal: Toileting - Clothing Manipulation - Progress: Goal set today Additional ADL Goal #1: Pt will perform dynamic sitting balance activities with Min A for greater than in prep for seated ADLs ADL Goal: Additional Goal #1 - Progress: Goal set today Additional ADL Goal #2: Pt will incorporate RUE in bilateral UE tasks 100% of the time with Min VC's. ADL Goal: Additional Goal #2 - Progress: Goal set today  Visit Information  Last OT  Received On: 05/06/12 Assistance Needed: +2    Subjective Data  Subjective: "Anywho" Patient Stated Goal: None stated, but wife reports she feels her husband would like to be able to play music again  Prior Functioning     Home Living Lives With: Spouse;Daughter Available Help at Discharge: Family;Other (Comment) (wife works during the day) Type of Home: House Home Access: Stairs to enter [employer] of Steps: 3 Entrance Stairs-Rails: Right;Left;Can reach both Home Layout: Two level;Able to live on main level with bedroom/bathroom Alternate Level Stairs-Number of Steps: 12 Alternate Level Stairs-Rails: Right Bathroom Shower/Tub: Walk-in shower;Door (has both) Teacher, early years/pre: Yes How Accessible: Accessible via walker Home Adaptive Equipment: None Prior Function Level of Independence: Independent Able to Take Stairs?: Yes Driving: Yes Vocation: Part time employment Communication Communication: Expressive difficulties Dominant Hand: Right         Vision/Perception Vision - Assessment Additional Comments: smooth pursuit deficits- pt with segmental eye movements. Difficulty to fully assess due to cognition.   Cognition  Overall Cognitive Status: Impaired Area of Impairment: Attention;Memory;Following commands;Safety/judgement;Awareness of errors;Awareness of deficits;Problem solving Arousal/Alertness: Awake/alert Orientation Level: Place;Time;Situation Behavior During Session: Restless Current Attention Level: Focused Memory Deficits: unable to recall reason why he is here although he has been told by surgeons what happened, understands 75% of reason why Following Commands: Follows one step commands with increased time Safety/Judgement: Decreased safety judgement for tasks assessed;Decreased awareness of need for assistance Awareness of Errors: Assistance required to identify errors made;Assistance required to correct  errors made Awareness of Deficits: unaware of right sided lean or weakness (Pt able to notice right sided lean when looking in mirror) Problem Solving: unable to problem solve Cognition - Other Comments: increased time required to complete all tasks or answer questions    Extremity/Trunk Assessment Right Upper Extremity Assessment RUE ROM/Strength/Tone: Unable to fully assess;Due to impaired cognition;Deficits RUE ROM/Strength/Tone Deficits: pt with difficulty moving RUE to command. Pt is able to touch nose and reach head. Able to flex and extend fingers, but unable to grade resistance. Left Upper Extremity Assessment LUE ROM/Strength/Tone: Unable to fully assess;Due to impaired cognition (appears [facility])     Mobility Bed Mobility Bed Mobility: Supine to Sit;Sitting - Scoot to Edge of Bed;Sit to Supine Supine to Sit: 1: +2 Total assist Supine to Sit: Patient Percentage: 40% Sitting - Scoot to Edge of Bed: 1: +2 Total assist Sitting - Scoot to Edge of Bed: Patient Percentage: 40% Details for Bed Mobility Assistance: Max cues for sequencing and max cues to remain on task.  Pt needs step by step instructions and easily distracted.  (A) with LE OOB and (A) with trunk OOB. Transfers Sit to Stand: 1: +2 Total assist;From bed Sit to Stand: Patient Percentage: 40% Stand to Sit: 1: +2 Total assist;To chair/3-in-1 Stand to Sit: Patient Percentage: 40% Details for Transfer Assistance: +2 (A) to maintain balance and initiate transfer and slowly descend to recliner.  Pt with right LE knee buckling and continues to lean to right side during transfer.  Pt was trasnfer to right side due to heavy pusher towards the right and will resist trasnfer to left side.     Shoulder Instructions     Exercise     Balance Balance Balance Assessed: Yes Static Sitting Balance Static Sitting - Balance Support: Feet supported;No upper extremity supported Static Sitting - Level of Assistance: 2: Max assist;5: Stand by  assistance Static Sitting - Comment/# of Minutes: Initally max (A) due to right sided lean and pushing towards right side.  Pt able to advance to minguard (A) with sitting with tactile cues to lean to left side,  taking left sided support away and leaning pt onto left elbow.  Static Standing Balance Static Standing - Balance Support: No upper extremity supported Static Standing - Level of Assistance: 1: +2 Total assist Static Standing - Comment/# of Minutes: ~1 minute to maintain upright posture and promote midline.  Pt continue to lean to right side and able to self correct by looking in mirror.   End of Session OT - End of Session Equipment Utilized During Treatment: Gait belt Activity Tolerance: Patient tolerated treatment well Patient left: in chair;with call bell/phone within reach;with family/visitor present Nurse Communication: Mobility status  GO     Nakota Elsen 05/06/2012, 12:48 PM

## 2012-05-07 ENCOUNTER — Inpatient Hospital Stay (HOSPITAL_COMMUNITY)
Admission: RE | Admit: 2012-05-07 | Discharge: 2012-05-28 | DRG: 945 | Disposition: A | Discharge status: Home / Self Care | Payer: 59 | Source: Ambulatory Visit | Attending: Physical Medicine & Rehabilitation | Admitting: Physical Medicine & Rehabilitation

## 2012-05-07 DIAGNOSIS — N319 Neuromuscular dysfunction of bladder, unspecified: Secondary | ICD-10-CM | POA: Diagnosis present

## 2012-05-07 DIAGNOSIS — Z5189 Encounter for other specified aftercare: Principal | ICD-10-CM

## 2012-05-07 DIAGNOSIS — I6200 Nontraumatic subdural hemorrhage, unspecified: Secondary | ICD-10-CM

## 2012-05-07 DIAGNOSIS — S069X9A Unspecified intracranial injury with loss of consciousness of unspecified duration, initial encounter: Secondary | ICD-10-CM

## 2012-05-07 DIAGNOSIS — I10 Essential (primary) hypertension: Secondary | ICD-10-CM | POA: Diagnosis present

## 2012-05-07 DIAGNOSIS — I82729 Chronic embolism and thrombosis of deep veins of unspecified upper extremity: Secondary | ICD-10-CM | POA: Diagnosis present

## 2012-05-07 DIAGNOSIS — F1010 Alcohol abuse, uncomplicated: Secondary | ICD-10-CM

## 2012-05-07 DIAGNOSIS — W19XXXA Unspecified fall, initial encounter: Secondary | ICD-10-CM

## 2012-05-07 DIAGNOSIS — S065X9A Traumatic subdural hemorrhage with loss of consciousness of unspecified duration, initial encounter: Secondary | ICD-10-CM

## 2012-05-07 DIAGNOSIS — Z79899 Other long term (current) drug therapy: Secondary | ICD-10-CM

## 2012-05-07 DIAGNOSIS — N39498 Other specified urinary incontinence: Secondary | ICD-10-CM | POA: Diagnosis present

## 2012-05-07 DIAGNOSIS — D62 Acute posthemorrhagic anemia: Secondary | ICD-10-CM | POA: Diagnosis present

## 2012-05-07 DIAGNOSIS — R339 Retention of urine, unspecified: Secondary | ICD-10-CM

## 2012-05-07 DIAGNOSIS — S065XAA Traumatic subdural hemorrhage with loss of consciousness status unknown, initial encounter: Secondary | ICD-10-CM | POA: Diagnosis present

## 2012-05-07 DIAGNOSIS — S06300A Unspecified focal traumatic brain injury without loss of consciousness, initial encounter: Secondary | ICD-10-CM | POA: Diagnosis present

## 2012-05-07 DIAGNOSIS — X58XXXA Exposure to other specified factors, initial encounter: Secondary | ICD-10-CM | POA: Diagnosis present

## 2012-05-07 LAB — BASIC METABOLIC PANEL
GFR calc Af Amer: >90 mL/min (ref >90)
GFR calc non Af Amer: >90 mL/min (ref >90)
Glucose, Bld: 99 mg/dL (ref 70–99)
Potassium: 3.8 mEq/L (ref 3.5–5.1)
Sodium: 134 mEq/L — ABNORMAL LOW (ref 135–145)

## 2012-05-07 MED ORDER — TRAZODONE HCL 50 MG PO TABS
25.0000 mg | ORAL_TABLET | Freq: Every evening | ORAL | Status: DC | PRN
  Filled 2012-05-07: qty 1

## 2012-05-07 MED ORDER — FLEET ENEMA 7-19 GM/118ML RE ENEM: 1.0000 | ENEMA | Freq: Once | RECTAL | Status: AC | PRN

## 2012-05-07 MED ORDER — TAMSULOSIN HCL 0.4 MG PO CAPS: 0.4000 mg | ORAL_CAPSULE | Freq: Every day | ORAL | Status: DC

## 2012-05-07 MED ORDER — PROCHLORPERAZINE MALEATE 5 MG PO TABS
5.0000 mg | ORAL_TABLET | Freq: Four times a day (QID) | ORAL | Status: DC | PRN
  Filled 2012-05-07: qty 2

## 2012-05-07 MED ORDER — TAMSULOSIN HCL 0.4 MG PO CAPS
0.4000 mg | ORAL_CAPSULE | Freq: Every day | ORAL | Status: AC
  Administered 2012-05-08 – 2012-05-10 (×3): 0.4 mg via ORAL
  Filled 2012-05-07 (×3): qty 1

## 2012-05-07 MED ORDER — ENSURE COMPLETE PO LIQD
237.0000 mL | Freq: Two times a day (BID) | ORAL | Status: DC
Start: 2012-05-08 — End: 2012-05-28
  Administered 2012-05-08 – 2012-05-27 (×29): 237 mL via ORAL

## 2012-05-07 MED ORDER — PROCHLORPERAZINE EDISYLATE 5 MG/ML IJ SOLN
5.0000 mg | Freq: Four times a day (QID) | INTRAMUSCULAR | Status: DC | PRN
  Filled 2012-05-07: qty 2

## 2012-05-07 MED ORDER — PROCHLORPERAZINE 25 MG RE SUPP
12.5000 mg | Freq: Four times a day (QID) | RECTAL | Status: DC | PRN
  Filled 2012-05-07: qty 1

## 2012-05-07 MED ORDER — FOLIC ACID 1 MG PO TABS
1.0000 mg | ORAL_TABLET | Freq: Every day | ORAL | Status: DC
  Administered 2012-05-08 – 2012-05-28 (×21): 1 mg via ORAL
  Filled 2012-05-07 (×25): qty 1

## 2012-05-07 MED ORDER — DIPHENHYDRAMINE HCL 12.5 MG/5ML PO ELIX: 12.5000 mg | ORAL_SOLUTION | Freq: Four times a day (QID) | ORAL | Status: DC | PRN

## 2012-05-07 MED ORDER — GUAIFENESIN-DM 100-10 MG/5ML PO SYRP: 5.0000 mL | ORAL_SOLUTION | Freq: Four times a day (QID) | ORAL | Status: DC | PRN

## 2012-05-07 MED ORDER — POLYETHYLENE GLYCOL 3350 17 G PO PACK
17.0000 g | PACK | Freq: Every day | ORAL | Status: DC | PRN
  Filled 2012-05-07: qty 1

## 2012-05-07 MED ORDER — ALUM & MAG HYDROXIDE-SIMETH 200-200-20 MG/5ML PO SUSP: 30.0000 mL | ORAL | Status: DC | PRN

## 2012-05-07 MED ORDER — VITAMIN B-1 50 MG PO TABS
250.0000 mg | ORAL_TABLET | Freq: Every day | ORAL | Status: DC
  Administered 2012-05-08 – 2012-05-28 (×21): 250 mg via ORAL
  Filled 2012-05-07 (×25): qty 1

## 2012-05-07 MED ORDER — FOLIC ACID 1 MG PO TABS: 1.0000 mg | ORAL_TABLET | Freq: Every day | ORAL | Status: DC

## 2012-05-07 MED ORDER — THIAMINE HCL 250 MG PO TABS: 250.0000 mg | ORAL_TABLET | Freq: Every day | ORAL | Status: DC

## 2012-05-07 MED ORDER — BISACODYL 10 MG RE SUPP: 10.0000 mg | Freq: Every day | RECTAL | Status: DC | PRN

## 2012-05-07 MED ORDER — ENSURE COMPLETE PO LIQD: 237.0000 mL | Freq: Two times a day (BID) | ORAL | Status: DC

## 2012-05-07 MED ORDER — ACETAMINOPHEN 325 MG PO TABS: 325.0000 mg | ORAL_TABLET | ORAL | Status: DC | PRN

## 2012-05-07 MED ORDER — PANTOPRAZOLE SODIUM 40 MG PO TBEC
40.0000 mg | DELAYED_RELEASE_TABLET | Freq: Every day | ORAL | Status: DC
  Administered 2012-05-08 – 2012-05-27 (×20): 40 mg via ORAL
  Filled 2012-05-07 (×20): qty 1

## 2012-05-07 MED ORDER — SODIUM CHLORIDE 0.9 % IV SOLN: INTRAVENOUS | Status: DC

## 2012-05-07 NOTE — Progress Notes (Signed)
Checked on patient at approximately 1530 and the patient was laying in bed. AT 1556 Jennifer the nurse tech found pt sitting on his bottom on the floor at the foot of the bed. There are no sign of injury. Reassessed pt's neuro status; alert and oriented x4; moves all extremities, weaker on the right side. Notified CCM. No new orders received. Will continue to monitor. Will notify the wife.

## 2012-05-07 NOTE — Progress Notes (Signed)
Nutrition Follow-up  Intervention:   Continue Ensure Complete po BID, each supplement provides 350 kcal and 13 grams of protein.  Assessment:   Pt is POD #9 for craniotomy, evacuation of SDH, evacuation of L frontal hemorrhage and partial L frontal lobectomy.  Pt extubated 11/14. Pt now followed by SLP.  Pt discussed in ICU rounds.  Plan for d/c to in-patient rehab. Spoke with pt and wife who report that pt eating 50-75% of meals. Per pt he drinks at least one Ensure Complete per day and likes them. Encouraged pt to drink Ensure to supplement PO intake from diet. Pt has food preferences. Gave pt/wife a menu to review. We discussed limitations of his diet.   Diet Order:  Dysphagia 2 with Thin Liquids  Meds: Scheduled Meds:    . feeding supplement  237 mL Oral BID BM  . folic acid  1 mg Oral Daily  . pantoprazole  40 mg Oral Q1200  . sodium chloride  10-40 mL Intracatheter Q12H  . Tamsulosin HCl  0.4 mg Oral QPC supper  . thiamine  250 mg Oral Daily   Continuous Infusions:    . sodium chloride 20 mL/hr at 05/02/12 1122   PRN Meds:.acetaminophen, fentaNYL, hydrALAZINE, labetalol, [DISCONTINUED] sodium chloride   CMP     Component Value Date/Time   NA 134* 05/07/2012 0405   K 3.8 05/07/2012 0405   CL 101 05/07/2012 0405   CO2 20 05/07/2012 0405   GLUCOSE 99 05/07/2012 0405   BUN 15 05/07/2012 0405   CREATININE 0.59 05/07/2012 0405   CALCIUM 8.9 05/07/2012 0405   PROT 7.9 04/28/2012 1105   ALBUMIN 4.3 04/28/2012 1105   AST 88* 04/28/2012 1105   ALT 42 04/28/2012 1105   ALKPHOS 60 04/28/2012 1105   BILITOT 0.3 04/28/2012 1105   GFRNONAA >90 05/07/2012 0405   GFRAA >90 05/07/2012 0405   CBG (last 3)   Basename 05/05/12 0559 05/05/12 0005 05/04/12 1226  GLUCAP 100* 100* 110*   Sodium  Date/Time Value Range Status  05/07/2012  4:05 AM 134* 135 - 145 mEq/L Final  05/06/2012  6:44 AM 136  135 - 145 mEq/L Final  05/05/2012  5:00 AM 137  135 - 145 mEq/L Final     Potassium  Date/Time Value Range Status  05/07/2012  4:05 AM 3.8  3.5 - 5.1 mEq/L Final  05/06/2012  6:44 AM 4.3  3.5 - 5.1 mEq/L Final  05/05/2012  5:00 AM 3.3* 3.5 - 5.1 mEq/L Final    Phosphorus  Date/Time Value Range Status  05/03/2012  4:30 AM 2.7  2.3 - 4.6 mg/dL Final  86/57/8469  6:29 PM 2.0* 2.3 - 4.6 mg/dL Final  52/84/1324  4:01 AM 1.9* 2.3 - 4.6 mg/dL Final    Magnesium  Date/Time Value Range Status  05/03/2012  4:30 AM 2.2  1.5 - 2.5 mg/dL Final  02/72/5366  4:40 PM 2.2  1.5 - 2.5 mg/dL Final  34/74/2595  6:38 AM 2.3  1.5 - 2.5 mg/dL Final    Intake/Output Summary (Last 24 hours) at 05/07/12 0819 Last data filed at 05/07/12 0600  Gross per 24 hour  Intake    420 ml  Output   2215 ml  Net  -1795 ml   Weight Status:  176 lb on admission 11/10 184 lb 11/16 182 lb 11/15 196 lb 11/13  Re-estimated needs:  1900-2100 kcal; 80-95 grams protein  Nutrition Dx:  Inadequate oral intake r/t decreased appetite AEB meal completion <75%.  Goal:  Pt to meet >/= 90% of their estimated nutrition needs; not met.  Monitor:  Diet/supplement tolerance, PO intake, weight  Kendell Bane RD, LDN, CNSC [phone] Pager [phone] After Hours Pager

## 2012-05-07 NOTE — Progress Notes (Signed)
Physical Therapy Treatment Patient Details Name: Wayne Buckley MRN: [id number] DOB: July 24, 1950 Today's Date: 05/07/2012 Time: 4098-1191 PT Time Calculation (min): 30 min  PT Assessment / Plan / Recommendation Comments on Treatment Session  Pt able to increase standing time and needed decrease assistance with bed mobility.  Improved overall sitting balance and attention.  Pt able to recall objects in the room however needed extra time to find correct words and needs small cues to complete thoughts.  Continue to highly recommend inpatient rehab at d/c.    Follow Up Recommendations  CIR     Does the patient have the potential to tolerate intense rehabilitation   Yes  Barriers to Discharge  NONE      Equipment Recommendations    TBD   Recommendations for Other Services Rehab consult  Frequency Min 4X/week   Plan Discharge plan remains appropriate;Frequency remains appropriate    Precautions / Restrictions Precautions Precautions: Fall Restrictions Weight Bearing Restrictions: No   Pertinent Vitals/Pain No c/o pain    Mobility  Bed Mobility Bed Mobility: Supine to Sit;Sitting - Scoot to Edge of Bed;Sit to Supine Supine to Sit: 3: Mod assist Details for Bed Mobility Assistance: (A) to complete bed mobility and elevate shoulders OOB.  Pt able to advance LE OOB with tactile cues and constant cues for redirection to task. Transfers Transfers: Sit to Stand;Stand to Sit;Stand Pivot Transfers Sit to Stand: 1: +2 Total assist;From bed Sit to Stand: Patient Percentage: 60% Stand to Sit: 1: +2 Total assist;To chair/3-in-1 Stand to Sit: Patient Percentage: 60% Stand Pivot Transfers: 1: +2 Total assist Stand Pivot Transfers: Patient Percentage: 50% Details for Transfer Assistance: +2 (A) to maintain balance and initiate transfer and slowly descend to recliner.  Pt with right LE knee buckling and continues to lean to right side during transfer.  Pt was trasnfer to right side due to heavy  pusher towards the right and will resist trasnfer to left side. Ambulation/Gait Ambulation/Gait Assistance: Not tested (comment) Modified Rankin (Stroke Patients Only) Pre-Morbid Rankin Score: No symptoms Modified Rankin: Severe disability    Exercises  NONE   PT Diagnosis:    PT Problem List:   PT Treatment Interventions:     PT Goals Acute Rehab PT Goals PT Goal Formulation: With patient/family Time For Goal Achievement: 05/19/12 Potential to Achieve Goals: Fair Pt will go Supine/Side to Sit: with min assist PT Goal: Supine/Side to Sit - Progress: Progressing toward goal Pt will Sit at [facility] of Bed: with min assist;3-5 min;with bilateral upper extremity support;Other (comment) PT Goal: Sit at [facility] Of Bed - Progress: Partly met Pt will go Sit to Supine/Side: with min assist PT Goal: Sit to Supine/Side - Progress: Progressing toward goal Pt will go Sit to Stand: with max assist PT Goal: Sit to Stand - Progress: Progressing toward goal Pt will go Stand to Sit: with max assist PT Goal: Stand to Sit - Progress: Progressing toward goal Pt will Stand: with max assist;1 - 2 min;with bilateral upper extremity support PT Goal: Stand - Progress: Progressing toward goal  Visit Information  Last PT Received On: 05/07/12 Assistance Needed: +2    Subjective Data  Subjective: "Speed ball." (When asked what the football was in the room) Patient Stated Goal: no stated goal   Cognition  Overall Cognitive Status: Impaired Area of Impairment: Attention;Memory;Following commands;Safety/judgement;Awareness of errors;Awareness of deficits;Problem solving Arousal/Alertness: Awake/alert Orientation Level: Disoriented to;Situation (Able to orient to time with use of calendar.  P) Behavior During Session: [facility]  for tasks performed Current Attention Level: Focused (Intermittent sustained attention) Following Commands: Follows one step commands with increased time (redirected to  task) Safety/Judgement: Decreased safety judgement for tasks assessed;Decreased awareness of need for assistance Awareness of Deficits: Pt more aware of right sided lean however needs cues to correct the right sided lean but able to correct when cued. Problem Solving: Pt able to slowly problem solve.  When asked pt to kick right LE pt able to problem solve and use left LE to assist Right LE.  Pt also aware of the right sided weakness Cognition - Other Comments: increased time required to complete all tasks or answer questions    Balance  Balance Balance Assessed: Yes Static Sitting Balance Static Sitting - Balance Support: Feet supported;No upper extremity supported Static Sitting - Level of Assistance: 4: Min assist;5: Stand by assistance Static Sitting - Comment/# of Minutes: initial min (A) to maintain upright posture and promote midline.  Once pt placed in midline pt able to maintain with minguard for safety.  Pt needed VCs and tactile cues to prevent posterior lean. Dynamic Sitting Balance Dynamic Sitting - Balance Support: Feet supported;Left upper extremity supported Dynamic Sitting - Level of Assistance: 3: Mod assist Dynamic Sitting - Balance Activities: Lateral lean/weight shifting;Forward lean/weight shifting Dynamic Sitting - Comments: ~5 minutes with cues for weight shift towards left side to promote correct body postion and prevent right sided lean Static Standing Balance Static Standing - Balance Support: During functional activity Static Standing - Level of Assistance: 1: +2 Total assist;Patient percentage (comment) (pt 50%) Static Standing - Comment/# of Minutes: ~2 minutes x 2 with max cues for proper midline and prevent right sided lean.  Manual cues to prevent right knee buckling and facilitation at hips to promote left LE weight shift.  Performed standing in mirror for visual cues for midline.  Pt able to perform functional acitivities such as washing face while standing and  naming objects on counter.    End of Session PT - End of Session Equipment Utilized During Treatment: Gait belt Activity Tolerance: Patient tolerated treatment well Patient left: in chair;with call bell/phone within reach Nurse Communication: Mobility status   GP     Kazimir Hartnett 05/07/2012, 9:21 AM Jake Shark, PT DPT [phone]

## 2012-05-07 NOTE — H&P (Signed)
Physical Medicine and Rehabilitation Admission H&P    Chief Complaint  Patient presents with  . TBI  : HPI: Wayne Buckley is a 61 y.o. male with history of HTN, ETOH abuse who was found down and noted to be comatose at admission on 04/28/12. UDS + for THC and ETOH @121 . CT head with left frontal lobe IPH, left frontal SDH, L-SAH, left to right midline shift with suspicion of early uncal herniation. Patient intubated for airway protection and underwent L-crani for evacuation of SDH, left frontal hemorrhage and partial left frontal lobectomy the same day by Dr. Gerlene Fee. F/U CCT with resolution of SAH and stable post op changes without new bleeding. Started on zosyn for fever with leucocytosis due to  LLL HAP on 11/12--through 11/16. Agitation  Resolving.    Extubated on 11/14. Noted to have RUE axillary DVT on 11/14--no heparin or blood thinner for now per NS and PICC discontinued. Foley discontinued 11/15 and patient unable to void, therefore foley replaced. Therapies initiated. Patient has poor attention with distractibility, confusion and expressive deficits. On D2, thin liquids due to right buccal pocketing and needs cues for safe swallow strategies.  He continues with right sided weakness with right knee, right facial droop and pusher syndrome with right lean. Therapy working on pre gait activities. MD, PT, ST recommending CIR for progression. After surgical clearance the patient was transferred to inpatient rehab unit.   Review of Systems  Unable to perform ROS: other  Eyes: Negative for blurred vision and double vision.  Respiratory: Negative for shortness of breath.   Cardiovascular: Negative for chest pain and palpitations.  Neurological: Negative for headaches.   Past Medical History  Diagnosis Date  . Hypertension    Past Surgical History  Procedure Date  . Craniotomy 04/28/2012    Procedure: CRANIOTOMY HEMATOMA EVACUATION SUBDURAL;  Surgeon: Reinaldo Meeker, MD;  Location: [facility] OR;   Service: Neurosurgery;  Laterality: Left;  Left Craniotomy for Subdural Hematoma   History reviewed. No pertinent family history.  Social History: Married. Works part time/owns properties. Per reports he has never smoked. He has never used smokeless tobacco. Per reports that he drinks alcohol daily and heavily on the weekends. +THC use.   Allergies: No Known Allergies   Scheduled Meds:   . feeding supplement  237 mL Oral BID BM  . folic acid  1 mg Oral Daily  . pantoprazole  40 mg Oral Q1200  . sodium chloride  10-40 mL Intracatheter Q12H  . Tamsulosin HCl  0.4 mg Oral QPC supper  . thiamine  250 mg Oral Daily    Medications Prior to Admission  Medication Sig Dispense Refill  . Ascorbic Acid (VITAMIN C) 1000 MG tablet Take 1,000 mg by mouth 2 (two) times daily.      Marland Kitchen aspirin 325 MG EC tablet Take 325 mg by mouth 2 (two) times daily.      . Liver Extract (LIVER PO) Take 1 tablet by mouth daily.      . Multiple Vitamin (MULTIVITAMIN WITH MINERALS) TABS Take 1 tablet by mouth daily.      Marland Kitchen OVER THE COUNTER MEDICATION Take 1 tablet by mouth daily. Supplement for vision.      . quinapril-hydrochlorothiazide (ACCURETIC) 20-12.5 MG per tablet Take 1 tablet by mouth every evening.        Home: Home Living Lives With: Spouse;Daughter Available Help at Discharge: Family;Other (Comment) Type of Home: House Home Access: Stairs to enter [employer] of Steps: 3  Entrance Stairs-Rails: Right;Left;Can reach both Home Layout: Two level;Able to live on main level with bedroom/bathroom Alternate Level Stairs-Number of Steps: 12 Alternate Level Stairs-Rails: Right Bathroom Shower/Tub: Walk-in shower;Door (has both) Teacher, early years/pre: Yes How Accessible: Accessible via walker Home Adaptive Equipment: None   Functional History: Prior Function Able to Take Stairs?: Yes Driving: Yes Vocation: Part time employment Comments: landlord  Functional  Status:  Mobility: Bed Mobility Bed Mobility: Supine to Sit;Sitting - Scoot to [place] of Bed;Sit to Supine Supine to Sit: 3: Mod assist Supine to Sit: Patient Percentage: 40% Sitting - Scoot to Edge of Bed: 1: +2 Total assist Sitting - Scoot to Edge of Bed: Patient Percentage: 40% Sit to Supine: 1: +2 Total assist Sit to Supine: Patient Percentage: 20% Transfers Transfers: Sit to Stand;Stand to Sit;Stand Pivot Transfers Sit to Stand: 1: +2 Total assist;From bed Sit to Stand: Patient Percentage: 60% Stand to Sit: 1: +2 Total assist;To chair/3-in-1 Stand to Sit: Patient Percentage: 60% Stand Pivot Transfers: 1: +2 Total assist Stand Pivot Transfers: Patient Percentage: 50% Ambulation/Gait Ambulation/Gait Assistance: Not tested (comment)    ADL: ADL Grooming: Maximal assistance Where Assessed - Grooming: Supported sitting Lower Body Dressing: +2 Total assistance Where Assessed - Lower Body Dressing: Supported sit to Pharmacist, hospital: +2 Total assistance Toilet Transfer Method: Surveyor, minerals:  (bed to chair) Equipment Used: Gait belt Transfers/Ambulation Related to ADLs: +2 total (pt=40/50%) sit to stand and stand pivot from EOB to chair  Cognition: Cognition Overall Cognitive Status: Impaired Arousal/Alertness: Awake/alert Orientation Level: Oriented X4 Attention: Focused;Sustained Focused Attention: Appears intact Sustained Attention: Impaired Sustained Attention Impairment: Verbal complex;Functional complex;Functional basic;Verbal basic Memory: Impaired Memory Impairment: Storage deficit;Retrieval deficit;Decreased recall of new information;Decreased short term memory Decreased Short Term Memory: Verbal basic;Functional basic Awareness: Impaired Awareness Impairment: Intellectual impairment Problem Solving: Impaired Problem Solving Impairment: Verbal complex;Functional complex Behaviors: Impulsive Safety/Judgment: Impaired [employer] Scales of Cognitive Functioning: Confused/inappropriate/non-agitated Cognition Overall Cognitive Status: Impaired Area of Impairment: Attention;Memory;Following commands;Safety/judgement;Awareness of errors;Awareness of deficits;Problem solving Arousal/Alertness: Awake/alert Orientation Level: Disoriented to;Situation (Able to orient to time with use of calendar.  P) Behavior During Session: [facility] for tasks performed Current Attention Level: Focused (Intermittent sustained attention) Memory Deficits: unable to recall reason why he is here although he has been told by surgeons what happened, understands 75% of reason why Following Commands: Follows one step commands with increased time (redirected to task) Safety/Judgement: Decreased safety judgement for tasks assessed;Decreased awareness of need for assistance Awareness of Errors: Assistance required to identify errors made;Assistance required to correct errors made Awareness of Deficits: Pt more aware of right sided lean however needs cues to correct the right sided lean but able to correct when cued. Problem Solving: Pt able to slowly problem solve.  When asked pt to kick right LE pt able to problem solve and use left LE to assist Right LE.  Pt also aware of the right sided weakness Cognition - Other Comments: increased time required to complete all tasks or answer questions   Blood pressure 122/84, pulse 94, temperature 98.4 F (36.9 C), temperature source Oral, resp. rate 17, height 5\' 10"  (1.778 m), weight 83.9 kg (184 lb 15.5 oz), SpO2 97.00%. Physical Exam  Nursing note and vitals reviewed. Constitutional: He appears well-developed and well-nourished.  HENT:  Head: Normocephalic.       Left crani incision clean and dry with staples in place. Dry crusted blood along incision line.   Eyes: Pupils are equal,  round, and reactive to light.  Neck: Normal range of motion.  Cardiovascular: Normal rate and regular rhythm.     Pulmonary/Chest: Effort normal and breath sounds normal.  Abdominal: Soft. Bowel sounds are normal.  Musculoskeletal: He exhibits no edema.  Neurological: He is alert.       Oriented to self and place today.  Able to state date but unable to state situation. Can identify 25% simple objects.  Unaware of current deficits. Poor insight.  Verbal output improved. Right sided weakness UE>LE--?motor apraxia RUE is grossly 2-3/5. RLE is 2/5 prox to 1/5 distally. Withdraws to pain on the right side.  Right facial weakness with tongue deviaition.   Speech clear but slow rate. Distracted. Able to follow simple one and two step commands.   Skin: Skin is warm and dry.  Psychiatric:       Impulsive, repeating reaching for head, incision. Overall pleasant and seems to be in good spiritsl    Results for orders placed during the hospital encounter of 04/28/12 (from the past 48 hour(s))  BASIC METABOLIC PANEL     Status: Abnormal   Collection Time   05/06/12  6:44 AM      Component Value Range Comment   Sodium 136  135 - 145 mEq/L    Potassium 4.3  3.5 - 5.1 mEq/L    Chloride 104  96 - 112 mEq/L    CO2 22  19 - 32 mEq/L    Glucose, Bld 101 (*) 70 - 99 mg/dL    BUN 14  6 - 23 mg/dL    Creatinine, Ser 9.60  0.50 - 1.35 mg/dL    Calcium 8.9  8.4 - 45.4 mg/dL    GFR calc non Af Amer >90  >90 mL/min    GFR calc Af Amer >90  >90 mL/min   BASIC METABOLIC PANEL     Status: Abnormal   Collection Time   05/07/12  4:05 AM      Component Value Range Comment   Sodium 134 (*) 135 - 145 mEq/L    Potassium 3.8  3.5 - 5.1 mEq/L    Chloride 101  96 - 112 mEq/L    CO2 20  19 - 32 mEq/L    Glucose, Bld 99  70 - 99 mg/dL    BUN 15  6 - 23 mg/dL    Creatinine, Ser 0.98  0.50 - 1.35 mg/dL    Calcium 8.9  8.4 - 11.9 mg/dL    GFR calc non Af Amer >90  >90 mL/min    GFR calc Af Amer >90  >90 mL/min    No results found.  Post Admission Physician Evaluation: 1. 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 2. Patient is admitted to receive collaborative, interdisciplinary care between the physiatrist, rehab nursing staff, and therapy team. 3. Patient's level of medical complexity and substantial therapy needs in context of that medical necessity cannot be provided at a lesser intensity of care such as a SNF. 4. Patient has experienced substantial functional loss from his/her baseline which was documented above under the "Functional History" and "Functional Status" headings.  Judging by the patient's diagnosis, physical exam, and functional history, the patient has potential for functional progress which will result in measurable gains while on inpatient rehab.  These gains will be of substantial and practical use upon discharge  in facilitating mobility and self-care at the household level. 5. Physiatrist will provide 24 hour management  of medical needs as well as oversight of the therapy plan/treatment and provide guidance as appropriate regarding the interaction of the two. 6. 24 hour rehab nursing will assist with bladder management, bowel management, safety, skin/wound care, disease management, medication administration, pain management and patient education  and help integrate therapy concepts, techniques,education, etc. 7. PT will assess and treat for:  fxnl mobility, NMR, CPT, safety, strength, family and pt ed.  Goals are: supervision to min assist. 8. OT will assess and treat for: UES, ADL's, safety, fxnl mobility, NMR, CPT, adaptive equipment.   Goals are: supervision to minimal assist. 9. SLP will assess and treat for: cognition, communication, swallowing.  Goals are: supervision to minimal assist. 10. Case Management and Social Worker will assess and treat for psychological issues and discharge planning. 11. Team conference will be held weekly to assess progress toward goals and to determine barriers to discharge. 12. Patient will receive at least 3 hours of therapy  per day at least 5 days per week. 13. ELOS: 3 weeks      Prognosis:  excellent   Medical Problem List and Plan: 1. DVT Prophylaxis/Anticoagulation: Mechanical: Sequential compression devices, below knee Bilateral lower extremities 2. Pain Management: denies any pain. 3. Mood: no signs of distress. Patient with significant cognitive deficits with poor awareness and impaired insight.  4. Neuropsych: This patient is not capable of making decisions on his/her own behalf. 5. ABLA: recheck in am.  6. RUE DVT: no blood thinner for now per NS. 7. Polysubstance abuse: agitation resolved. Denies any problems with alcohol/drug use. Will continue to monitor, but overall behavior is quite pleasant at present.  8. HTN: off BP meds at current time. Monitor with bid checks for trends.    05/07/2012, Ivory Broad, MD

## 2012-05-07 NOTE — PMR Pre-admission (Signed)
PMR Admission Coordinator Pre-Admission Assessment  Patient: Wayne Buckley is an 61 y.o., male MRN: [id number] DOB: 1951/04/25 Height: 5\' 10"  (177.8 cm) Weight: 83.9 kg (184 lb 15.5 oz)  Insurance Information HMO:     PPO:      PCP:      IPA:      80/20:      OTHER: Choice Plus Plan PRIMARY: [employer]      Policy#: [id number]      Subscriber: spouse CM Name: Shanon      Phone#: [phone]     Fax#: on site Pre-Cert#: [id number]      Employer: [employer] #[id number] Benefits:  Phone #: [phone]     Name: 11/19 Maryjean Ka. Date: 1/13 active     Deduct: $650      Out of Pocket Max: $2500      Life Max: none CIR: 80%      SNF: 80% 90 days Outpatient: 80%     Co-Pay: 20% 60 visits combined Home Health: 80%      Co-Pay: 20% 60 visits combined DME: 80%     Co-Pay: 20% Providers: in network  SECONDARY: none        Medicaid Application Date:       Case Manager:  Disability Application Date:       Case Worker:   Emergency Conservator, museum/gallery Information    Name Relation Home Work Mobile   TIREE, KRUMWIEDE  [phone]       Current Medical History  Patient Admitting Diagnosis: Right SDH s/p lobectomy, crani  History of Present Illness: Wayne Buckley is a 61 y.o. male with history of HTN, ETOH abuse who was found down and noted to be comatose at admission on 04/28/12. UDS + for THC and ETOH @121 . CT head with left frontal lobe IPH, left frontal SDH, L-SAH, left to right midline shift with suspicion of early uncal herniation. Patient intubated for airway protection and underwent L-crani for evacuation of SDH, left frontal hemorrhage and partial left frontal lobectomy the same day by Dr. Gerlene Fee. F/U CCT with resolution of SAH and stable post op changes without new bleeding. Started on zosyn for possible HAP on 11/12. Extubated on 11/14. Noted to have RUE axillary DVT on 11/14--no heparin or blood thinner for now per NS. PICC discontinued.  Therapies initiated. Patient has poor  attention with distractibility, confusion and expressive deficits. On D2, thin liquids due to bilateral pocketing. He continues with right sided weakness, right facial droop and pusher syndrome with right lean.   Past Medical History  Past Medical History  Diagnosis Date  . Hypertension     Family History  family history is not on file.  Prior Rehab/Hospitalizations: none  Current Medications  Current facility-administered medications:0.9 %  sodium chloride infusion, , Intravenous, Continuous, Kalman Shan, MD, Last Rate: 20 mL/hr at 05/02/12 1122;  acetaminophen (TYLENOL) suppository 650 mg, 650 mg, Rectal, Q4H PRN, Oretha Milch, MD;  feeding supplement (ENSURE COMPLETE) liquid 237 mL, 237 mL, Oral, BID BM, Heather Cornelison Pitts, RD, 237 mL at 05/07/12 1000 fentaNYL (SUBLIMAZE) injection 12.5-25 mcg, 12.5-25 mcg, Intravenous, Q2H PRN, Merwyn Katos, MD;  folic acid (FOLVITE) tablet 1 mg, 1 mg, Oral, Daily, Oretha Milch, MD, 1 mg at 05/07/12 0936;  hydrALAZINE (APRESOLINE) injection 5-20 mg, 5-20 mg, Intravenous, Q2H PRN, Merwyn Katos, MD, 10 mg at 05/06/12 0310;  labetalol (NORMODYNE,TRANDATE) injection 10-20 mg, 10-20 mg, Intravenous, Q10 min PRN, Onalee Hua  Ree Kida, MD pantoprazole (PROTONIX) EC tablet 40 mg, 40 mg, Oral, Q1200, Merwyn Katos, MD, 40 mg at 05/07/12 1146;  sodium chloride 0.9 % injection 10-40 mL, 10-40 mL, Intracatheter, Q12H, Kalman Shan, MD, 10 mL at 05/07/12 1112;  Tamsulosin HCl (FLOMAX) capsule 0.4 mg, 0.4 mg, Oral, QPC supper, Leslye Peer, MD, 0.4 mg at 05/06/12 1751;  thiamine (VITAMIN B-1) tablet 250 mg, 250 mg, Oral, Daily, Merwyn Katos, MD, 250 mg at 05/07/12 0981 [DISCONTINUED] sodium chloride 0.9 % injection 10-40 mL, 10-40 mL, Intracatheter, PRN, Kalman Shan, MD, 10 mL at 05/03/12 2128  Patients Current Diet: Dysphagia Dysphagia 2 diet with thin liquids  Precautions / Restrictions Precautions Precautions: Fall Restrictions Weight  Bearing Restrictions: No   Prior Activity Level 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; landlord part time  Journalist, newspaper / Equipment Home Assistive Devices/Equipment: None Home Adaptive Equipment: None  Prior Functional Level Prior Function Level of Independence: Independent Able to Take Stairs?: Yes Driving: Yes Vocation: Part time employment Comments: landlord  Current Functional Level Cognition  Arousal/Alertness: Awake/alert Overall Cognitive Status: Impaired Overall Cognitive Status: Impaired Current Attention Level: Focused (Intermittent sustained attention) Memory Deficits: unable to recall reason why he is here although he has been told by surgeons what happened, understands 75% of reason why Orientation Level: Oriented X4 Following Commands: Follows one step commands with increased time (redirected to task) Safety/Judgement: Decreased safety judgement for tasks assessed;Decreased awareness of need for assistance Awareness of Errors: Assistance required to identify errors made;Assistance required to correct errors made Awareness of Deficits: Pt more aware of right sided lean however needs cues to correct the right sided lean but able to correct when cued. Cognition - Other Comments: increased time required to complete all tasks or answer questions Attention: Focused;Sustained Focused Attention: Appears intact Sustained Attention: Impaired Sustained Attention Impairment: Verbal complex;Functional complex;Functional basic;Verbal basic Memory: Impaired Memory Impairment: Storage deficit;Retrieval deficit;Decreased recall of new information;Decreased short term memory Decreased Short Term Memory: Verbal basic;Functional basic Awareness: Impaired Awareness Impairment: Intellectual impairment Problem Solving: Impaired Problem Solving Impairment: Verbal complex;Functional complex Behaviors: Impulsive Safety/Judgment: Impaired Rancho Mirant Scales of Cognitive  Functioning: Confused/inappropriate/non-agitated    Extremity Assessment (includes Sensation/Coordination)  RUE ROM/Strength/Tone: Unable to fully assess;Due to impaired cognition;Deficits RUE ROM/Strength/Tone Deficits: pt with difficulty moving RUE to command. Pt is able to touch nose and reach head. Able to flex and extend fingers, but unable to grade resistance.  RLE ROM/Strength/Tone: Deficits RLE ROM/Strength/Tone Deficits: No witnessed AROM; PROM WFL RLE Sensation: Deficits RLE Sensation Deficits: decreased response    ADLs  Grooming: Maximal assistance Where Assessed - Grooming: Supported sitting Lower Body Dressing: +2 Total assistance Where Assessed - Lower Body Dressing: Supported sit to Pharmacist, hospital: +2 Total assistance Toilet Transfer: Patient Percentage: 50% Statistician Method: Surveyor, minerals:  (bed to chair) Toileting - Clothing Manipulation and Hygiene: +1 Total assistance Where Assessed - Toileting Clothing Manipulation and Hygiene: Sit to stand from 3-in-1 or toilet Equipment Used: Gait belt Transfers/Ambulation Related to ADLs: +2 total (pt=40/50%) sit to stand and stand pivot from EOB to chair    Mobility  Bed Mobility: Supine to Sit;Sitting - Scoot to [place] of Bed;Sit to Supine Supine to Sit: 3: Mod assist Supine to Sit: Patient Percentage: 40% Sitting - Scoot to Edge of Bed: 1: +2 Total assist Sitting - Scoot to Edge of Bed: Patient Percentage: 40% Sit to Supine: 1: +2 Total assist Sit to Supine: Patient Percentage: 20%  Transfers  Transfers: Sit to Stand;Stand to Sit;Stand Pivot Transfers Sit to Stand: 1: +2 Total assist;From bed Sit to Stand: Patient Percentage: 60% Stand to Sit: 1: +2 Total assist;To chair/3-in-1 Stand to Sit: Patient Percentage: 60% Stand Pivot Transfers: 1: +2 Total assist Stand Pivot Transfers: Patient Percentage: 50%    Ambulation / Gait / Stairs / Wheelchair Mobility   Ambulation/Gait Ambulation/Gait Assistance: Not tested (comment)    Posture / Balance Static Sitting Balance Static Sitting - Balance Support: Feet supported;No upper extremity supported Static Sitting - Level of Assistance: 4: Min assist;5: Stand by assistance Static Sitting - Comment/# of Minutes: initial min (A) to maintain upright posture and promote midline.  Once pt placed in midline pt able to maintain with minguard for safety.  Pt needed VCs and tactile cues to prevent posterior lean. Dynamic Sitting Balance Dynamic Sitting - Balance Support: Feet supported;Left upper extremity supported Dynamic Sitting - Level of Assistance: 3: Mod assist Dynamic Sitting - Balance Activities: Lateral lean/weight shifting;Forward lean/weight shifting Dynamic Sitting - Comments: ~5 minutes with cues for weight shift towards left side to promote correct body postion and prevent right sided lean Static Standing Balance Static Standing - Balance Support: During functional activity Static Standing - Level of Assistance: 1: +2 Total assist;Patient percentage (comment) (pt 50%) Static Standing - Comment/# of Minutes: ~2 minutes x 2 with max cues for proper midline and prevent right sided lean.  Manual cues to prevent right knee buckling and facilitation at hips to promote left LE weight shift.  Performed standing in mirror for visual cues for midline.  Pt able to perform functional acitivities such as washing face while standing and naming objects on counter.       Previous Home Environment Living Arrangements:  (wife and 25 year old daughter) Lives With: Spouse;Daughter Available Help at Discharge: Family;Other (Comment) Type of Home: House Home Layout: Two level;Able to live on main level with bedroom/bathroom Alternate Level Stairs-Rails: Right Alternate Level Stairs-Number of Steps: 12 Home Access: Stairs to enter Entrance Stairs-Rails: Right;Left;Can reach both Entrance Stairs-Number of Steps:  3 Bathroom Shower/Tub: Walk-in shower;Door (has both) Teacher, early years/pre: Yes How Accessible: Accessible via walker Home Care Services: No  Discharge Living Setting Plans for Discharge Living Setting: Patient's home;Lives with (comment) (wife and 39 year old daughter) Type of Home at Discharge: House Discharge Home Layout: Two level;Able to live on main level with bedroom/bathroom Alternate Level Stairs-Rails: Right Alternate Level Stairs-Number of Steps: 12 steps Discharge Home Access: Stairs to enter Entrance Stairs-Rails: Right;Left;Can reach both Entrance Stairs-Number of Steps: 3 steps Discharge Bathroom Shower/Tub: Walk-in shower Discharge Bathroom Toilet: Standard Discharge Bathroom Accessibility: Yes How Accessible: Accessible via walker Do you have any problems obtaining your medications?: No  Social/Family/Support Systems Patient Roles: Spouse;Parent;Other (Comment) (land lord) Contact Information: Gust Eugene, wife Anticipated Caregiver: wife and maybe may hire caregivers; wife FMLA Anticipated Caregiver's Contact Information: home [phone]; cell [phone] Ability/Limitations of Caregiver: may take FMLA initially Caregiver Availability:  (works days at [employer] for 33 years. ) Discharge Plan Discussed with Primary Caregiver: Yes Is Caregiver In Agreement with Plan?: Yes Does Caregiver/Family have Issues with Lodging/Transportation while Pt is in Rehab?: No  Goals/Additional Needs Patient/Family Goal for Rehab: min PT, Min to mod OT, min to mod SLP Expected length of stay: ELOS 3 to 4 weeks Special Service Needs: Patient heavy ETOH [place] PTA Pt/Family Agrees to Admission and willing to participate: Yes Program Orientation Provided & Reviewed with Pt/Caregiver Including Roles  &  Responsibilities: Yes  Patient Condition: This patient's condition remains as documented in the Consult dated 05/06/2012, in which the Rehabilitation Physician  determined and documented that the patient's condition is appropriate for intensive rehabilitative care in an inpatient rehabilitation facility.  Preadmission Screen Completed By:  Clois Dupes, 05/07/2012 12:56 PM ______________________________________________________________________   Discussed status with [name] on 05/07/2012 at  1257 and received telephone approval for admission today.  Admission Coordinator:  Clois Dupes, time 1257 Date 05/07/2012.

## 2012-05-07 NOTE — Progress Notes (Signed)
I met with patient and his wife at bedside. Patient will benefit from an inpatient rehabilitaton admission and she is in agreement. Patient does have [facility], previously listed as self pay. I will alert financial counselor and begin insurance authorization to hopefully admit pt to inpatient acute rehab today. Patient and wife are in agreement. [phone]

## 2012-05-07 NOTE — Progress Notes (Signed)
Insurance has approved and pt to be d/c'd to inpt rehab today. I have notified wife and she is in agreement. [phone]

## 2012-05-07 NOTE — Plan of Care (Signed)
Problem: Consults Goal: Diagnosis - Craniotomy Outcome: Completed/Met Date Met:  05/07/12 Subdural hematoma

## 2012-05-07 NOTE — Progress Notes (Signed)
Patient ID: Wayne Buckley, male   DOB: 1950/10/25, 61 y.o.   MRN: [id number] Subjective: Patient reports doing better  Objective: Vital signs in last 24 hours: Temp:  [97.3 F (36.3 C)-99.2 F (37.3 C)] 98.4 F (36.9 C) (11/19 0808) Pulse Rate:  [53-109] 94  (11/19 1100) Resp:  [11-21] 17  (11/19 1100) BP: (122-154)/(71-107) 122/84 mmHg (11/19 1000) SpO2:  [95 %-100 %] 97 % (11/19 1100)  Intake/Output from previous day: 11/18 0701 - 11/19 0700 In: 440 [P.O.:420; I.V.:20] Out: 2310 [Urine:2310] Intake/Output this shift: Total I/O In: -  Out: 285 [Urine:285]  awake, alert, following complex commands. He is now using right upper extremity. Speech with some definite aphasia  Lab Results: No results found for this basename: WBC:2,HGB:2,HCT:2,PLT:2 in the last 72 hours BMET  [facility] 05/07/12 0405 05/06/12 0644  NA 134* 136  K 3.8 4.3  CL 101 104  CO2 20 22  GLUCOSE 99 101*  BUN 15 14  CREATININE 0.59 0.56  CALCIUM 8.9 8.9    Studies/Results: No results found.  Assessment/Plan: Doing well. Supposed to go to inpatient rehab soon.  LOS: 9 days  as above   Reinaldo Meeker, MD 05/07/2012, 11:39 AM

## 2012-05-07 NOTE — Progress Notes (Signed)
Contacted CCM to clarify orders regarding the foley and IV before discharging patient to In-patient rehab. Dr. Frederico Hamman said that due to the pt's urinary retention the patient could go to rehab with the foley and IV.

## 2012-05-07 NOTE — Plan of Care (Signed)
Problem: Phase I Progression Outcomes Goal: Voiding-avoid urinary catheter unless indicated Outcome: Progressing Urinary retention

## 2012-05-07 NOTE — Progress Notes (Signed)
PULMONARY  / CRITICAL CARE MEDICINE  Name: Wayne Buckley MRN: [id number] DOB: 08/25/1950    LOS: 9 Date of admit 04/28/2012   REFERRING MD :  Jeraldine Loots (EDP)   CHIEF COMPLAINT:  AMS, vent   BRIEF PATIENT DESCRIPTION:  61 yo male with hx HTN, EtOH admitted 11/10 with AMS and SDH after fall.  Intubated for airway protection. Underwent L craniotomy 11/10 for SDH evacuation ([place])  SIGNIFICANT EVENTS:  11/10 CT head: Significant left frontal lobe intraparenchymal hematoma. L frontal SDH. L sided SAH. Left to right midline shift. Suspect early uncal herniation. 11/10 L craniotomy for SDH evacuation 11/11 CT head:Interval evacuation of the left frontal hematoma. Significant reduction in mass effect and midline shift, measuring approximately 3 mm 11/13 CT head: Postoperative changes from evacuation of left frontal hematoma.  Pneumocephalus and residual left frontal hemorrhage are stable. Slight decrease in the size of the left temporal hemorrhage. Resolve subarachnoid blood. Stable intraventricular blood. No hydrocephalus. 11/15 CT head: No significant or worrisome change 11/14 BUE, BLE venous dopplers: RUE DVT. No heparin per NS  LEVEL OF CARE:  ICU PRIMARY SERVICE:  PCCM CONSULTANTS:  Neurosurgery  CODE STATUS: full DIET:  Dys II started 11/17 DVT Px:  SCD's   GI Px:  protonix   LINES / TUBES: ETT 11/10 >> 11/15 PICC line RUE 11/12 >> 11/14  MICRO: MRSA PCR 11/10 >> NEG Urine 11/10 >> NEG Resp 11/10 >> NOF Blood 11/11 >> NEG Urine 11/12 >> NEG Resp 11/12 >> NOF   ABX: Zosyn 11/12 >> 11/16  SUBJECTIVE/OVERNIGHT/INTERVAL HX No further agitation reported Tolerates the modified diet, worked with PT today 11/19  VITAL SIGNS: Temp:  [97.3 F (36.3 C)-99.2 F (37.3 C)] 98.4 F (36.9 C) (11/19 0808) Pulse Rate:  [53-108] 108  (11/19 1000) Resp:  [11-21] 19  (11/19 1000) BP: (122-154)/(71-107) 122/84 mmHg (11/19 1000) SpO2:  [95 %-99 %] 97 % (11/19 1000)     Intake/Output Summary (Last 24 hours) at 05/07/12 1013 Last data filed at 05/07/12 0800  Gross per 24 hour  Intake    240 ml  Output   1995 ml  Net  -1755 ml    PHYSICAL EXAMINATION: General: RASS 0. + F/C.  Neuro: R hemiplegia and R facial droop HEENT:  Mm dry, no JVD, surgical wound CDI Cardiovascular:  RRR s M Lungs:  Clear Abdomen:  Soft, +bs Ext: warm and dry, no edema   BMET    Component Value Date/Time   NA 134* 05/07/2012 0405   K 3.8 05/07/2012 0405   CL 101 05/07/2012 0405   CO2 20 05/07/2012 0405   GLUCOSE 99 05/07/2012 0405   BUN 15 05/07/2012 0405   CREATININE 0.59 05/07/2012 0405   CALCIUM 8.9 05/07/2012 0405   GFRNONAA >90 05/07/2012 0405   GFRAA >90 05/07/2012 0405    CBC    Component Value Date/Time   WBC 9.3 05/03/2012 0430   RBC 3.17* 05/03/2012 0430   HGB 9.5* 05/03/2012 0430   HCT 28.5* 05/03/2012 0430   PLT 275 05/03/2012 0430   MCV 89.9 05/03/2012 0430   MCH 30.0 05/03/2012 0430   MCHC 33.3 05/03/2012 0430   RDW 12.9 05/03/2012 0430   LYMPHSABS 2.0 04/28/2012 1105   MONOABS 1.5* 04/28/2012 1105   EOSABS 0.1 04/28/2012 1105   BASOSABS 0.0 04/28/2012 1105    ABG    Component Value Date/Time   PHART 7.429 04/29/2012 0453   PCO2ART 33.0* 04/29/2012 0453   PO2ART 138.0*  04/29/2012 0453   HCO3 21.5 04/29/2012 0453   TCO2 22.5 04/29/2012 0453   ACIDBASEDEF 2.2* 04/29/2012 0453   O2SAT 99.3 04/29/2012 0453    CXR:   No new film   DIAGNOSES:  *Subdural hematoma, post-traumatic, s/p evacuation  Last CT scan 11/15 >> stable improvement compared with 11/3; no new bleeding  Post op mgmt per NS  Deemed suitable for rehab   Altered MS - improved  H/O EtOH -  No evidence of withdrawal currently, not requiring benzos  Acute respiratory failure  resolved  Concern for aspiration pneumonia  Afebrile off abx  RUE DVT (PICC removed)  Need to re-address timing of therapeutic anticoagulation with NS >> nothing restarted yet as of 11/19   Anemia  No acute blood loss. No indication for PRBCs  Urinary retention  Failed attempt at Foley removal 11/15 Hypokalemia > resolved  Follow BMP  PLAN: Transfer to floor bed unless he can go to rehab today Monitor for signs of EtOH W/D  Will re-address timing of therapeutic anticoagulation with NS, will not start at this time.  Follow BMP Leave Foley cath in place for now, will add alpha-agent on 11/18 for few days and then retry removal Inpatient rehab consult placed 11/18 >> good candidate, should be able to go when insurance approves and medically clear  Levy Pupa, MD, PhD 05/07/2012, 10:13 AM [facility] [phone] or if no answer [phone]

## 2012-05-07 NOTE — Progress Notes (Signed)
Patient admitted at 1905 via bed . Patient alert and oriented x 1 left scalp incision with staples intact . Foley intact draining clear yellow urine . Oriented patient and family to room and call bell system . Side rails up x 4 for safety . Reviewed safety protocol with patient and family. Continue with plan of care.             Cleotilde Neer

## 2012-05-07 NOTE — Discharge Summary (Signed)
Physician Discharge Summary     Patient ID: Wayne Buckley MRN: [id number] DOB/AGE: [birth year] 61 y.o.  Admit date: 04/28/2012 Discharge date: 05/07/2012  Admission Diagnoses: Altered Mental status Discharge Diagnoses:  Principal Problem:  *Subdural hematoma, post-traumatic Active Problems:  Acute respiratory failure  Aspiration pneumonia  Alcohol abuse  Hypokalemia  Urinary retention  Encephalopathy acute   Significant Hospital tests/ studies/ interventions and procedures  SIGNIFICANT EVENTS:  11/10 CT head: Significant left frontal lobe intraparenchymal hematoma. L frontal SDH. L sided SAH. Left to right midline shift. Suspect early uncal herniation.  11/10 L craniotomy for SDH evacuation  11/11 CT head:Interval evacuation of the left frontal hematoma. Significant reduction in mass effect and midline shift, measuring approximately 3 mm  11/13 CT head: Postoperative changes from evacuation of left frontal hematoma.  Pneumocephalus and residual left frontal hemorrhage are stable. Slight decrease in the size of the left temporal hemorrhage. Resolve subarachnoid blood. Stable intraventricular blood. No hydrocephalus.  11/15 CT head: No significant or worrisome change  11/14 BUE, BLE venous dopplers: RUE DVT. No heparin per NS  LINES / TUBES:  ETT 11/10 >> 11/15  PICC line RUE 11/12 >> 11/14  MICRO:  MRSA PCR 11/10 >> NEG  Urine 11/10 >> NEG  Resp 11/10 >> NOF  Blood 11/11 >> NEG  Urine 11/12 >> NEG  Resp 11/12 >> NOF   ABX:  Zosyn 11/12 >> 11/16  HPI :61 yo male with hx HTN, heavy ETOH presented 11/10 after being found unresponsive outside by wife. Unknown downtime - wife heard a sound about an hour before finding him. Pt was out most of the night drinking (drinks daily, heavily on the weekends, unsure if he has even had withdrawal as he has never has a period of time without drinking) and when wife awoke in the am he was lying unresponsive on his back on the deck. Wife  started CPR although she was unsure if he was pulseless, on EMS arrival he had strong pulse and agonal respirations. Did have laceration/hematoma to back of head and some cuts/scrapes on arms and legs. Prior to admit was being treated for a "head cold" with amoxicillin but had been feeling better.  Hospital Course:  Subdural hematoma, post-traumatic, s/p evacuation  Last CT scan 11/15 >> stable improvement compared with 11/3; no new bleeding.  Post op mgmt per NS  Deemed suitable for rehab   Altered MS - improved. This was due to his SDH. Continues to improved.   H/O EtOH - No evidence of withdrawal currently, not requiring benzos   Acute respiratory failure: intubated initially for airway protection. Extubated on 11/15. No concerns at this point from pulmonary standpoint this is resolved   Concern for aspiration pneumonia  Afebrile off abx   RUE DVT (PICC removed)  Need to re-address timing of therapeutic anticoagulation with NS >> nothing restarted yet as of 11/19   Anemia  No acute blood loss. No indication for PRBCs   Urinary retention  Failed attempt at Foley removal 11/15  Discharge Exam: BP 130/80  Pulse 96  Temp 100 F (37.8 C) (Oral)  Resp 17  Ht 5\' 10"  (1.778 m)  Wt 83.9 kg (184 lb 15.5 oz)  BMI 26.54 kg/m2  SpO2 99%  PHYSICAL EXAMINATION:  General: RASS 0. + F/C.  Neuro: R hemiplegia and R facial droop  HEENT: Mm dry, no JVD, surgical wound CDI  Cardiovascular: RRR s M  Lungs: Clear  Abdomen: Soft, +bs  Ext: warm and  dry, no edema    Labs at discharge Lab Results  Component Value Date   CREATININE 0.59 05/07/2012   BUN 15 05/07/2012   NA 134* 05/07/2012   K 3.8 05/07/2012   CL 101 05/07/2012   CO2 20 05/07/2012   Lab Results  Component Value Date   WBC 9.3 05/03/2012   HGB 9.5* 05/03/2012   HCT 28.5* 05/03/2012   MCV 89.9 05/03/2012   PLT 275 05/03/2012   Lab Results  Component Value Date   ALT 42 04/28/2012   AST 88* 04/28/2012   ALKPHOS  60 04/28/2012   BILITOT 0.3 04/28/2012   Lab Results  Component Value Date   INR 1.06 04/28/2012    Current radiology studies No results found.  Disposition:  Final discharge disposition not confirmed      Discharge Orders    Future Appointments: Provider: Department: Dept Phone: Center:   05/08/2012 7:30 AM Melonie Florida, OT [facility]  [address] [facility]  [facility] [phone] None   05/08/2012 9:00 AM Jackey Loge, PT [facility]  [address] [facility]  [facility] [phone] None     Joint Appt Leone Brand, PT [facility]  [address] [facility]  [facility] [phone] None   05/08/2012 1:45 PM Leone Brand, PT [facility]  [address] [facility]  [facility] [phone] None     Future Orders Please Complete By Expires   Diet - low sodium heart healthy      Increase activity slowly          Medication List     As of 05/07/2012  1:36 PM    STOP taking these medications         aspirin 325 MG EC tablet      OVER THE COUNTER MEDICATION      TAKE these medications         feeding supplement Liqd   Take 237 mLs by mouth 2 (two) times daily between meals.      folic acid 1 MG tablet   Commonly known as: FOLVITE   Take 1 tablet (1 mg total) by mouth daily.      LIVER PO   Take 1 tablet by mouth daily.      multivitamin with minerals Tabs   Take 1 tablet by mouth daily.      quinapril-hydrochlorothiazide 20-12.5 MG per tablet   Commonly known as: ACCURETIC   Take 1 tablet by mouth every evening.      sodium chloride 0.9 % infusion   KVO      Tamsulosin HCl 0.4 MG Caps   Commonly known as: FLOMAX   Take 1 capsule (0.4 mg total) by mouth daily after supper.      thiamine 250 MG tablet   Take 1 tablet (250 mg total) by mouth daily.      vitamin C 1000 MG tablet   Take 1,000 mg by mouth 2 (two) times daily.          Discharged Condition: {good Physician Statement:   The Patient was personally examined,  the discharge assessment and plan has been personally reviewed and I agree with ACNP Babcock's assessment and plan. > 30 minutes of time have been dedicated to discharge assessment, planning and discharge instructions.   SignedAnders Simmonds 05/07/2012, 1:36 PM  Levy Pupa, MD [place] [facility] 05/08/12

## 2012-05-08 ENCOUNTER — Inpatient Hospital Stay (HOSPITAL_COMMUNITY): Payer: 59 | Admitting: Occupational Therapy

## 2012-05-08 ENCOUNTER — Inpatient Hospital Stay (HOSPITAL_COMMUNITY): Payer: 59 | Admitting: *Deleted

## 2012-05-08 ENCOUNTER — Encounter (HOSPITAL_COMMUNITY): Payer: Self-pay | Admitting: *Deleted

## 2012-05-08 ENCOUNTER — Inpatient Hospital Stay (HOSPITAL_COMMUNITY): Payer: 59 | Admitting: Speech Pathology

## 2012-05-08 ENCOUNTER — Inpatient Hospital Stay (HOSPITAL_COMMUNITY): Payer: 59 | Admitting: Physical Therapy

## 2012-05-08 ENCOUNTER — Inpatient Hospital Stay (HOSPITAL_COMMUNITY): Payer: 59

## 2012-05-08 DIAGNOSIS — S069X9A Unspecified intracranial injury with loss of consciousness of unspecified duration, initial encounter: Secondary | ICD-10-CM

## 2012-05-08 DIAGNOSIS — F1010 Alcohol abuse, uncomplicated: Secondary | ICD-10-CM

## 2012-05-08 DIAGNOSIS — W19XXXA Unspecified fall, initial encounter: Secondary | ICD-10-CM

## 2012-05-08 DIAGNOSIS — S069XAA Unspecified intracranial injury with loss of consciousness status unknown, initial encounter: Secondary | ICD-10-CM

## 2012-05-08 LAB — CBC WITH DIFFERENTIAL/PLATELET
Basophils Absolute: 0.1 10*3/uL (ref 0.0–0.1)
Basophils Relative: 1 % (ref 0–1)
Eosinophils Absolute: 0.4 10*3/uL (ref 0.0–0.7)
Eosinophils Relative: 5 % (ref 0–5)
HCT: 35.1 % — ABNORMAL LOW (ref 39.0–52.0)
Lymphocytes Relative: 18 % (ref 12–46)
MCH: 30.6 pg (ref 26.0–34.0)
MCHC: 34.5 g/dL (ref 30.0–36.0)
MCV: 88.6 fL (ref 78.0–100.0)
Monocytes Absolute: 0.8 10*3/uL (ref 0.1–1.0)
RDW: 12.5 % (ref 11.5–15.5)

## 2012-05-08 LAB — URINALYSIS, ROUTINE W REFLEX MICROSCOPIC
Glucose, UA: NEGATIVE mg/dL
Leukocytes, UA: NEGATIVE
Nitrite: NEGATIVE
Specific Gravity, Urine: 1.019 (ref 1.005–1.030)
pH: 5.5 (ref 5.0–8.0)

## 2012-05-08 LAB — COMPREHENSIVE METABOLIC PANEL
AST: 34 U/L (ref 0–37)
CO2: 23 mEq/L (ref 19–32)
Calcium: 9.2 mg/dL (ref 8.4–10.5)
Creatinine, Ser: 0.61 mg/dL (ref 0.50–1.35)
GFR calc non Af Amer: >90 mL/min (ref >90)
Total Protein: 6.6 g/dL (ref 6.0–8.3)

## 2012-05-08 NOTE — Progress Notes (Signed)
Subjective/Complaints: Good night per rn. Resting comfortably in bed. Denies pain. A 12 point review of systems has been performed and if not noted above is otherwise negative.   Objective: Vital Signs: Blood pressure 146/92, pulse 80, temperature 98.2 F (36.8 C), temperature source Oral, resp. rate 19, weight 81.5 kg (179 lb 10.8 oz), SpO2 92.00%. No results found. No results found for this basename: WBC:2,HGB:2,HCT:2,PLT:2 in the last 72 hours  Basename 05/07/12 0405 05/06/12 0644  NA 134* 136  K 3.8 4.3  CL 101 104  CO2 20 22  GLUCOSE 99 101*  BUN 15 14  CREATININE 0.59 0.56  CALCIUM 8.9 8.9   CBG (last 3)  No results found for this basename: GLUCAP:3 in the last 72 hours  Wt Readings from Last 3 Encounters:  05/07/12 81.5 kg (179 lb 10.8 oz)  05/04/12 83.9 kg (184 lb 15.5 oz)  05/04/12 83.9 kg (184 lb 15.5 oz)    Physical Exam:  Constitutional: He appears well-developed and well-nourished.  HENT:  Head: Normocephalic.  Left crani incision clean and dry with staples in place. Dry crusted blood along incision line.  Eyes: Pupils are equal, round, and reactive to light.  Neck: Normal range of motion.  Cardiovascular: Normal rate and regular rhythm.  Pulmonary/Chest: Effort normal and breath sounds normal.  Abdominal: Soft. Bowel sounds are normal.  Musculoskeletal: He exhibits no edema.  Neurological: He is alert.  Oriented to self and place today. Able to state date but unable to state situation. Can identify 25% simple objects. Decreased awareness of current deficits. Poor insight.  Right sided weakness UE>LE--?motor apraxia RUE is grossly 3-4/5. RLE is 2-3/5 prox to 2+/5 distally. Withdraws to pain on the right side. Right facial weakness with tongue deviaition. Speech clear but slow rate. Distracted. Able to follow simple one and two step commands.  Skin: Skin is warm and dry.  Psychiatric:  Less restless, calm, collected    Assessment/Plan: 1. 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 Physiatrist is providing close team supervision and 24 hour management of active medical problems listed below. Physiatrist and rehab team continue to assess barriers to discharge/monitor patient progress toward functional and medical goals. FIM:                   Comprehension Comprehension Mode: Auditory  Expression Expression Mode: Verbal Expression: 1-Expresses basis less than 25% of the time/requires cueing greater than 75% of the time.  Social Interaction Social Interaction: 1-Interacts appropriately less than 25% of the time. May be withdrawn or combative.  Problem Solving Problem Solving: 1-Solves basic less than 25% of the time - needs direction nearly all the time or does not effectively solve problems and may need a restraint for safety  Memory Memory: 1-Recognizes or recalls less than 25% of the time/requires cueing greater than 75% of the time  Medical Problem List and Plan:  1. DVT Prophylaxis/Anticoagulation: Mechanical: Sequential compression devices, below knee Bilateral lower extremities  2. Pain Management: denies any pain.  3. Mood: no signs of distress. Patient with significant cognitive deficits with poor awareness and impaired insight.  4. Neuropsych: This patient is not capable of making decisions on his/her own behalf.  5. ABLA: recheck in am.  6. RUE DVT: no blood thinner for now per NS.  7. Polysubstance abuse: agitation resolved. Denies any problems with alcohol/drug use. Will continue to monitor,  but overall behavior is quite pleasant at present.  8. HTN: off BP meds at current time. Monitor with bid checks for trends.  9. Urine retention/neurogenic bladder: dc foley, begin training, urine sample collected    LOS (Days) 1 A FACE TO FACE EVALUATION WAS PERFORMED  Wayne Buckley  T 05/08/2012, 6:48 AM

## 2012-05-08 NOTE — Progress Notes (Signed)
Occupational Therapy Session Note  Patient Details  Name: Wayne Buckley MRN: [id number] Date of Birth: 09/17/1950  Today's Date: 05/08/2012 Time: 1400-1430 Time Calculation (min): 30 min  Skilled Therapeutic Interventions/Progress Updates:  Patient found seated in w/c at nurses station. Worked on patient propelling self from nurses station to table in dayroom. Therapeutic activity seated in w/c focusing on initiation, problem solving, functional use of RUE/hand (with constraint induced therapy-> LUE/hand), following commands, and transfer from w/c -> edge of bed -> supine in bed. Left patient supine in bed with NT present in room.   Precautions:  Precautions Precautions: Fall Precaution Comments: DVT in right UE- restricted  Restrictions Weight Bearing Restrictions: No  Pain: Pain Assessment Pain Assessment: No/denies pain  See FIM for current functional status  Therapy/Group: Individual Therapy  Valentina Alcoser 05/08/2012, 2:44 PM

## 2012-05-08 NOTE — Progress Notes (Signed)
Patient information reviewed and entered into eRehab system by Delshawn Stech, RN, CRRN, PPS Coordinator.  Information including medical coding and functional independence measure will be reviewed and updated through discharge.    

## 2012-05-08 NOTE — Evaluation (Signed)
Occupational Therapy Assessment and Plan  Patient Details  Name: Wayne Buckley MRN: [id number] Date of Birth: 1950-12-27  OT Diagnosis: cognitive deficits, hemiplegia affecting dominant side and muscle weakness (generalized) Rehab Potential: Rehab Potential: Good ELOS: 3 weeks   Today's Date: 05/08/2012 Time: 0730-0830 Time Calculation (min): 60 min  Problem List:  Patient Active Problem List  Diagnosis  . Acute respiratory failure  . Subdural hematoma, post-traumatic  . Aspiration pneumonia  . Alcohol abuse  . Hypokalemia  . Urinary retention  . Encephalopathy acute    Past Medical History:  Past Medical History  Diagnosis Date  . Hypertension    Past Surgical History:  Past Surgical History  Procedure Date  . Craniotomy 04/28/2012    Procedure: CRANIOTOMY HEMATOMA EVACUATION SUBDURAL;  Surgeon: Reinaldo Meeker, MD;  Location: [facility] OR;  Service: Neurosurgery;  Laterality: Left;  Left Craniotomy for Subdural Hematoma    Assessment & Plan Clinical Impression: Patient is a 61 y.o. year old male with history of HTN, ETOH abuse who was found down and noted to be comatose at admission on 04/28/12. UDS + for THC and ETOH @121 . CT head with left frontal lobe IPH, left frontal SDH, L-SAH, left to right midline shift with suspicion of early uncal herniation. Patient intubated for airway protection and underwent L-crani for evacuation of SDH, left frontal hemorrhage and partial left frontal lobectomy the same day by Dr. Gerlene Fee. F/U CCT with resolution of SAH and stable post op changes without new bleeding. Started on zosyn for fever with leucocytosis due to LLL HAP on 11/12--through 11/16. Agitation Resolving.  Extubated on 11/14. Noted to have RUE axillary DVT on 11/14--no heparin or blood thinner for now per NS and PICC discontinued. Foley discontinued 11/15 and patient unable to void, therefore foley replaced. Therapies initiated. Patient has poor attention with distractibility,  confusion and expressive deficits. On D2, thin liquids due to right buccal pocketing and needs cues for safe swallow strategies. He continues with right sided weakness with right knee, right facial droop and with lean to the right.  Patient transferred to CIR on 05/07/2012 .    Patient currently requires max with basic self-care skills and basic mobility secondary to muscle weakness, decreased cardiorespiratoy endurance, impaired timing and sequencing, unbalanced muscle activation, motor apraxia, decreased coordination and decreased motor planning, decreased visual motor skills, decreased attention to right, decreased initiation, decreased attention, decreased awareness, decreased problem solving, decreased safety awareness, decreased memory and delayed processing and decreased sitting balance, decreased standing balance, decreased postural control, hemiplegia and decreased balance strategies.  Prior to hospitalization, patient could complete ADL with indepedence. Pt with behavior consistent with Rancho Level VI.  Patient will benefit from skilled intervention to decrease level of assist with basic self-care skills and increase independence with basic self-care skills prior to discharge home with care partner.  Anticipate patient will require 24 hour supervision and follow up home health.  OT - End of Session Activity Tolerance: Tolerates 30+ min activity with multiple rests OT Assessment Rehab Potential: Good OT Plan OT Frequency: 1-2 X/day, 60-90 minutes Estimated Length of Stay: 3 weeks OT Treatment/Interventions: Balance/vestibular training;Community reintegration;Discharge planning;Cognitive remediation/compensation;DME/adaptive equipment instruction;Neuromuscular re-education;Pain management;Patient/family education;Self Care/advanced ADL retraining;Therapeutic Activities;Therapeutic Exercise;Psychosocial support;UE/LE Strength taining/ROM;UE/LE Coordination activities;Visual/perceptual  remediation/compensation;Wheelchair propulsion/positioning OT Recommendation Follow Up Recommendations: Outpatient OT  OT Evaluation Precautions/Restrictions  Precautions Precautions: Fall Precaution Comments: DVT in right UE- restricted  Restrictions Weight Bearing Restrictions: No General Chart Reviewed: Yes Family/Caregiver Present: Yes (at end of session wife came  in)    Pain Pain Assessment Pain Assessment: No/denies pain Home Living/Prior Functioning Home Living Lives With: Spouse;Daughter Available Help at Discharge: Family Type of Home: House Bathroom Shower/Tub: Walk-in Stage manager: Standard Bathroom Accessibility: Yes ADL  see FIM Vision/Perception  Vision - History Baseline Vision: Wears glasses all the time Vision - Assessment Vision Assessment: Vision impaired - to be further tested in functional context Additional Comments: smooth pursuits deficits, eye ROM with increased head movements, decreased smoothness of tracking  Perception Perception: Impaired Inattention/Neglect: Impaired-to be further tested in functional context Praxis Praxis: Impaired Praxis Impairment Details: Ideomotor;Motor planning  Cognition Overall Cognitive Status: Impaired Arousal/Alertness: Awake/alert Orientation Level: Oriented to person;Oriented to place;Oriented to situation;Disoriented to time (choices given if difficulty due to language) Attention: Sustained;Focused;Selective Focused Attention: Appears intact Sustained Attention: Appears intact Selective Attention: Impaired Selective Attention Impairment: Verbal basic;Functional basic Memory: Impaired Memory Impairment: Decreased recall of new information;Decreased short term memory Decreased Short Term Memory: Verbal basic;Functional basic Awareness: Impaired Awareness Impairment: Intellectual impairment Problem Solving: Impaired Problem Solving Impairment: Verbal basic;Functional basic Executive Function:  Reasoning;Sequencing;Organizing;Decision Making;Initiating;Self Correcting;Self Monitoring Reasoning: Impaired Reasoning Impairment: Verbal basic;Functional basic Sequencing: Impaired Sequencing Impairment: Verbal basic;Functional basic Organizing: Impaired Organizing Impairment: Verbal basic;Functional basic Decision Making: Impaired Decision Making Impairment: Verbal basic;Functional basic Initiating: Impaired Initiating Impairment: Verbal basic;Functional basic Self Monitoring: Impaired Self Monitoring Impairment: Verbal basic;Functional basic Self Correcting: Impaired Self Correcting Impairment: Verbal basic;Functional basic Behaviors: Impulsive Safety/Judgment: Impaired Rancho Mirant Scales of Cognitive Functioning: Confused/appropriate Sensation Sensation Light Touch: Impaired Detail Light Touch Impaired Details: Impaired RLE;Impaired RUE Proprioception: Impaired Detail Proprioception Impaired Details: Impaired RUE Coordination Gross Motor Movements are Fluid and Coordinated: No Fine Motor Movements are Fluid and Coordinated: No Coordination and Movement Description: decreased motor planning, and smoothness of movement Finger Nose Finger Test: impaired on right  Heel Shin Test: impaired on right Motor  Motor Motor: Hemiplegia;Motor apraxia;Abnormal postural alignment and control Mobility  Bed Mobility Supine to Sit: 3: Mod assist Transfers Sit to Stand: 3: Mod assist Stand to Sit: 3: Mod assist  Trunk/Postural Assessment  Cervical Assessment Cervical Assessment: Within Functional Limits Postural Control Postural Control: Deficits on evaluation Righting Reactions: delayed Protective Responses: present but significantly delayed  Balance Static Sitting Balance Static Sitting - Balance Support: Feet unsupported Static Sitting - Level of Assistance: 4: Min assist Dynamic Sitting Balance Dynamic Sitting - Level of Assistance: 3: Mod assist Static Standing  Balance Static Standing - Balance Support: During functional activity Static Standing - Level of Assistance: 2: Max assist Extremity/Trunk Assessment RUE Assessment RUE Assessment: Exceptions to WFL (Brunstrom V) LUE Assessment LUE Assessment: Within Functional Limits  See FIM for current functional status Refer to Care Plan for Long Term Goals  Recommendations for other services: Neuropsych  Discharge Criteria: Patient will be discharged from OT if patient refuses treatment 3 consecutive times without medical reason, if treatment goals not met, if there is a change in medical status, if patient makes no progress towards goals or if patient is discharged from hospital.  The above assessment, treatment plan, treatment alternatives and goals were discussed and mutually agreed upon: by patient  1:1 OT evaluation:  OT goals, purpose and role discussed with pt and then with wife at end of the session. Self care retraining with focus on grooming and dressing and self feeding with emphasis on right hand use, attention to task, functional communication of needs, motor coordination, transfers, sit to stands  Roney Mans [facility] 05/08/2012, 9:15 AM

## 2012-05-08 NOTE — Progress Notes (Signed)
Occupational Therapy Session Note  Patient Details  Name: Wayne Buckley MRN: [id number] Date of Birth: [birth year]  Today's Date: 05/08/2012 Time: 1430-1500 Time Calculation (min): 30 min  Skilled Therapeutic Interventions/Progress Updates:  Patient resting in bed upon arrival.  Engaged in bed mobility and grooming task with focus on bed mobility, sitting balance while seated unsupported at EOB, forced use of right hand for coordination and strengthening tasks while performing grooming tasks.  Patient demonstrates poor awareness and aphasia related to following directions, answering simple questions and conveying his impression of how well he performed all tasks placed before him.  Therapy Documentation Precautions:  Precautions Precautions: Fall Precaution Comments: DVT in right UE- restricted  Restrictions Weight Bearing Restrictions: No Pain: Pain Assessment Pain Assessment: No/denies pain  Therapy/Group: Individual Therapy  Delane Stalling 05/08/2012, 5:29 PM

## 2012-05-08 NOTE — Plan of Care (Signed)
Overall Plan of Care [facility]) Patient Details Name: Wayne Buckley MRN: [id number] DOB: July 16, 1950  Diagnosis:  TBI  Primary Diagnosis:    Subdural hematoma, post-traumatic Co-morbidities: htn, polysubstance abuse  Functional Problem List  Patient demonstrates impairments in the following areas: Balance, Bladder, Bowel, Cognition, Endurance, Linguistic, Medication Management, Motor, Pain, Perception and Sensory   Basic ADL's: eating, grooming, bathing, dressing and toileting Advanced ADL's:    Transfers:  bed mobility, bed to chair, toilet, tub/shower, car and furniture Locomotion:  ambulation, wheelchair mobility and stairs  Additional Impairments:  Functional use of upper extremity, Swallowing, Communication  comprehension and expression, Social Cognition   social interaction, problem solving, memory, attention and awareness, Leisure Awareness and Discharge Disposition  Anticipated Outcomes Item Anticipated Outcome  Eating/Swallowing  Supervision  Basic self-care  supervision  Tolieting  supervision  Bowel/Bladder  Min assist  Transfers  supervision  Locomotion  supervision  Communication  Min A  Cognition  Min A  Pain  < = 2  Safety/Judgment  Min assist  Other     Therapy Plan:   OT Frequency: 1-2 X/day, 60-90 minutes SLP Frequency: 1-2 X/day, 30-60 minutes   Team Interventions: Item RN PT OT SLP SW TR Other  Self Care/Advanced ADL Retraining   x      Neuromuscular Re-Education  x x      Therapeutic Activities  x x x     UE/LE Strength Training/ROM  x x      UE/LE Coordination Activities  x x      Visual/Perceptual Remediation/Compensation   x      DME/Adaptive Equipment Instruction  x x      Therapeutic Exercise  x x      Balance/Vestibular Training  x x      Patient/Family Education x x x x     Cognitive Remediation/Compensation  x x x     Functional Mobility Training  x x      Ambulation/Gait Training  x       Museum/gallery curator  x       Wheelchair  Propulsion/Positioning  x       Functional Tourist information centre manager Reintegration  x x      Dysphagia/Aspiration Printmaker    x     Speech/Language Facilitation    x     Bladder Management x        Bowel Management x        Disease Management/Prevention         Pain Management  x       Medication Management         Skin Care/Wound Management x        Splinting/Orthotics  x       Discharge Planning  x  x     Psychosocial Support   x x                        Team Discharge Planning: Destination:  Home Projected Follow-up:  PT, OT, SLP and Outpatient Projected Equipment Needs:  Scientist, physiological Patient/family involved in discharge planning:  Yes  MD ELOS: 3 weeks Medical Rehab Prognosis:  Excellent Assessment: Pt admitted for CIR therapies. The team is addressing cognition, language, self-care, NMR, safety, behavior, fxnl mobility, strength, adaptive techniques, equipment, and education. Goals are supervision to minimal assist.

## 2012-05-08 NOTE — Evaluation (Signed)
Speech Language Pathology Assessment and Plan  Patient Details  Name: RJ PEDROSA MRN: [id number] Date of Birth: Feb 24, 1951  SLP Diagnosis: Aphasia;Cognitive Impairments;Dysphagia  Rehab Potential: Excellent ELOS: 3 weeks   Today's Date: 05/08/2012 Time: 1000-1100 Time Calculation (min): 60 min  Skilled Therapeutic Intervention: Administered cognitive-linguistic evaluation and BSE. Please see below for details.   Problem List:  Patient Active Problem List  Diagnosis  . Acute respiratory failure  . Subdural hematoma, post-traumatic  . Aspiration pneumonia  . Alcohol abuse  . Hypokalemia  . Urinary retention  . Encephalopathy acute   Past Medical History:  Past Medical History  Diagnosis Date  . Hypertension    Past Surgical History:  Past Surgical History  Procedure Date  . Craniotomy 04/28/2012    Procedure: CRANIOTOMY HEMATOMA EVACUATION SUBDURAL;  Surgeon: Reinaldo Meeker, MD;  Location: [facility] OR;  Service: Neurosurgery;  Laterality: Left;  Left Craniotomy for Subdural Hematoma    Assessment / Plan / Recommendation Clinical Impression  Pt is a 61 y.o. male with history of HTN, ETOH abuse who was found down and noted to be comatose at admission on 04/28/12. UDS + for THC and ETOH @121 . CT head with left frontal lobe IPH, left frontal SDH, L-SAH, left to right midline shift with suspicion of early uncal herniation. Patient intubated for airway protection and underwent L-crani for evacuation of SDH, left frontal hemorrhage and partial left frontal lobectomy the same day by Dr. Gerlene Fee. F/U CCT with resolution of SAH and stable post op changes without new bleeding. Started on zosyn for fever with leucocytosis due to LLL HAP on 11/12--through 11/16. Agitation Resolving. Extubated on 11/14. Noted to have RUE axillary DVT on 11/14--no heparin or blood thinner for now per NS and PICC discontinued. Foley discontinued 11/15 and patient unable to void, therefore foley replaced. Pt  administered BSE 05/03/12 and recommended Dys. 2 textures with thin liquids due to right buccal pocketing and needs cueing for utilization of swallowing strategies. He continues with right sided weakness with right knee, right facial droop and pusher syndrome with right lean. Pt transferred to CIR 05/07/12 and presents with behaviors consistent with a Rancho Level VI and requires Max cueing for functional problem solving, selective attention, short-term memory and intellectual awareness. Pt also demonstrates language impairments that impact both his expressive and receptive abilities, however, pt's receptive abilities are better than his expressive. Pt continues to consume Dys. 2 textures with thin liquids without overt s/s of aspiration but requires Max A verbal cues for utilization of swallowing compensatory strategies. Pt would benefit from skilled SLP intervention to maximize cognitive recovery and overall functional independence.     SLP Assessment  Patient will need skilled Speech Lanaguage Pathology Services during CIR admission    Recommendations  Follow up Recommendations: 24 hour supervision/assistance;Outpatient SLP Equipment Recommended: None recommended by SLP    SLP Frequency 1-2 X/day, 30-60 minutes   SLP Treatment/Interventions Cognitive remediation/compensation;Cueing hierarchy;Dysphagia/aspiration precaution training;Functional tasks;Internal/external aids;Environmental controls;Patient/family education;Therapeutic Activities;Speech/Language facilitation    Pain Pain Assessment Pain Assessment: No/denies pain Prior Functioning Type of Home: House Lives With: Spouse;Daughter Available Help at Discharge: Family  Short Term Goals: Week 1: SLP Short Term Goal 1 (Week 1): Pt will consume trials of Dys. 3 textures with efficient mastication with Mod verbal cues.  SLP Short Term Goal 2 (Week 1): Pt will utilize swallowing compensatory strategies while consuming current diet with Mod  verbal and visual cues.  SLP Short Term Goal 3 (Week 1): Pt will identify  common objects with Min phonemic cues.  SLP Short Term Goal 4 (Week 1): Pt will identify 1 physical and 1 cognitive deficit with Max multimodal cueing SLP Short Term Goal 5 (Week 1): Pt will demonstrate selective attention to a functional task for 15 minutes with Max verbal cues for redirection  SLP Short Term Goal 6 (Week 1): Pt will utilize call bell to express wants/needs with Mod multimodal cueing.   See FIM for current functional status Refer to Care Plan for Long Term Goals  Recommendations for other services: None  Discharge Criteria: Patient will be discharged from SLP if patient refuses treatment 3 consecutive times without medical reason, if treatment goals not met, if there is a change in medical status, if patient makes no progress towards goals or if patient is discharged from hospital.  The above assessment, treatment plan, treatment alternatives and goals were discussed and mutually agreed upon: No family available/patient unable  Madhuri Vacca 05/08/2012, 12:02 PM

## 2012-05-08 NOTE — Interval H&P Note (Signed)
Wayne Buckley was admitted today to Inpatient Rehabilitation with the diagnosis of TBI.  The patient's history has been reviewed, patient examined, and there is no change in status.  Patient continues to be appropriate for intensive inpatient rehabilitation.  I have reviewed the patient's chart and labs.  Questions were answered to the patient's satisfaction.  This history and physical was completed on 11.19.13 but for purposes of charting was placed in the rehab chart today.   Canisha Issac T 05/08/2012, 6:43 AM

## 2012-05-08 NOTE — H&P (View-Only) (Signed)
Physical Medicine and Rehabilitation Admission H&P    Chief Complaint  Patient presents with  . TBI  : HPI: Wayne Buckley is a 61 y.o. male with history of HTN, ETOH abuse who was found down and noted to be comatose at admission on 04/28/12. UDS + for THC and ETOH @121. CT head with left frontal lobe IPH, left frontal SDH, L-SAH, left to right midline shift with suspicion of early uncal herniation. Patient intubated for airway protection and underwent L-crani for evacuation of SDH, left frontal hemorrhage and partial left frontal lobectomy the same day by Dr. Kritzer. F/U CCT with resolution of SAH and stable post op changes without new bleeding. Started on zosyn for fever with leucocytosis due to  LLL HAP on 11/12--through 11/16. Agitation  Resolving.    Extubated on 11/14. Noted to have RUE axillary DVT on 11/14--no heparin or blood thinner for now per NS and PICC discontinued. Foley discontinued 11/15 and patient unable to void, therefore foley replaced. Therapies initiated. Patient has poor attention with distractibility, confusion and expressive deficits. On D2, thin liquids due to right buccal pocketing and needs cues for safe swallow strategies.  He continues with right sided weakness with right knee, right facial droop and pusher syndrome with right lean. Therapy working on pre gait activities. MD, PT, ST recommending CIR for progression. After surgical clearance the patient was transferred to inpatient rehab unit.   Review of Systems  Unable to perform ROS: other  Eyes: Negative for blurred vision and double vision.  Respiratory: Negative for shortness of breath.   Cardiovascular: Negative for chest pain and palpitations.  Neurological: Negative for headaches.   Past Medical History  Diagnosis Date  . Hypertension    Past Surgical History  Procedure Date  . Craniotomy 04/28/2012    Procedure: CRANIOTOMY HEMATOMA EVACUATION SUBDURAL;  Surgeon: Randy O Kritzer, MD;  Location: [facility] OR;   Service: Neurosurgery;  Laterality: Left;  Left Craniotomy for Subdural Hematoma   History reviewed. No pertinent family history.  Social History: Married. Works part time/owns properties. Per reports he has never smoked. He has never used smokeless tobacco. Per reports that he drinks alcohol daily and heavily on the weekends. +THC use.   Allergies: No Known Allergies   Scheduled Meds:   . feeding supplement  237 mL Oral BID BM  . folic acid  1 mg Oral Daily  . pantoprazole  40 mg Oral Q1200  . sodium chloride  10-40 mL Intracatheter Q12H  . Tamsulosin HCl  0.4 mg Oral QPC supper  . thiamine  250 mg Oral Daily    Medications Prior to Admission  Medication Sig Dispense Refill  . Ascorbic Acid (VITAMIN C) 1000 MG tablet Take 1,000 mg by mouth 2 (two) times daily.      . aspirin 325 MG EC tablet Take 325 mg by mouth 2 (two) times daily.      . Liver Extract (LIVER PO) Take 1 tablet by mouth daily.      . Multiple Vitamin (MULTIVITAMIN WITH MINERALS) TABS Take 1 tablet by mouth daily.      . OVER THE COUNTER MEDICATION Take 1 tablet by mouth daily. Supplement for vision.      . quinapril-hydrochlorothiazide (ACCURETIC) 20-12.5 MG per tablet Take 1 tablet by mouth every evening.        Home: Home Living Lives With: Spouse;Daughter Available Help at Discharge: Family;Other (Comment) Type of Home: House Home Access: Stairs to enter Entrance Stairs-Number of Steps: 3   Entrance Stairs-Rails: Right;Left;Can reach both Home Layout: Two level;Able to live on main level with bedroom/bathroom Alternate Level Stairs-Number of Steps: 12 Alternate Level Stairs-Rails: Right Bathroom Shower/Tub: Walk-in shower;Door (has both) Bathroom Toilet: Standard Bathroom Accessibility: Yes How Accessible: Accessible via walker Home Adaptive Equipment: None   Functional History: Prior Function Able to Take Stairs?: Yes Driving: Yes Vocation: Part time employment Comments: landlord  Functional  Status:  Mobility: Bed Mobility Bed Mobility: Supine to Sit;Sitting - Scoot to Edge of Bed;Sit to Supine Supine to Sit: 3: Mod assist Supine to Sit: Patient Percentage: 40% Sitting - Scoot to Edge of Bed: 1: +2 Total assist Sitting - Scoot to Edge of Bed: Patient Percentage: 40% Sit to Supine: 1: +2 Total assist Sit to Supine: Patient Percentage: 20% Transfers Transfers: Sit to Stand;Stand to Sit;Stand Pivot Transfers Sit to Stand: 1: +2 Total assist;From bed Sit to Stand: Patient Percentage: 60% Stand to Sit: 1: +2 Total assist;To chair/3-in-1 Stand to Sit: Patient Percentage: 60% Stand Pivot Transfers: 1: +2 Total assist Stand Pivot Transfers: Patient Percentage: 50% Ambulation/Gait Ambulation/Gait Assistance: Not tested (comment)    ADL: ADL Grooming: Maximal assistance Where Assessed - Grooming: Supported sitting Lower Body Dressing: +2 Total assistance Where Assessed - Lower Body Dressing: Supported sit to stand Toilet Transfer: +2 Total assistance Toilet Transfer Method: Stand pivot Toilet Transfer Equipment:  (bed to chair) Equipment Used: Gait belt Transfers/Ambulation Related to ADLs: +2 total (pt=40/50%) sit to stand and stand pivot from EOB to chair  Cognition: Cognition Overall Cognitive Status: Impaired Arousal/Alertness: Awake/alert Orientation Level: Oriented X4 Attention: Focused;Sustained Focused Attention: Appears intact Sustained Attention: Impaired Sustained Attention Impairment: Verbal complex;Functional complex;Functional basic;Verbal basic Memory: Impaired Memory Impairment: Storage deficit;Retrieval deficit;Decreased recall of new information;Decreased short term memory Decreased Short Term Memory: Verbal basic;Functional basic Awareness: Impaired Awareness Impairment: Intellectual impairment Problem Solving: Impaired Problem Solving Impairment: Verbal complex;Functional complex Behaviors: Impulsive Safety/Judgment: Impaired Rancho [place] Scales of Cognitive Functioning: Confused/inappropriate/non-agitated Cognition Overall Cognitive Status: Impaired Area of Impairment: Attention;Memory;Following commands;Safety/judgement;Awareness of errors;Awareness of deficits;Problem solving Arousal/Alertness: Awake/alert Orientation Level: Disoriented to;Situation (Able to orient to time with use of calendar.  P) Behavior During Session: WFL for tasks performed Current Attention Level: Focused (Intermittent sustained attention) Memory Deficits: unable to recall reason why he is here although he has been told by surgeons what happened, understands 75% of reason why Following Commands: Follows one step commands with increased time (redirected to task) Safety/Judgement: Decreased safety judgement for tasks assessed;Decreased awareness of need for assistance Awareness of Errors: Assistance required to identify errors made;Assistance required to correct errors made Awareness of Deficits: Pt more aware of right sided lean however needs cues to correct the right sided lean but able to correct when cued. Problem Solving: Pt able to slowly problem solve.  When asked pt to kick right LE pt able to problem solve and use left LE to assist Right LE.  Pt also aware of the right sided weakness Cognition - Other Comments: increased time required to complete all tasks or answer questions   Blood pressure 122/84, pulse 94, temperature 98.4 F (36.9 C), temperature source Oral, resp. rate 17, height 5' 10" (1.778 m), weight 83.9 kg (184 lb 15.5 oz), SpO2 97.00%. Physical Exam  Nursing note and vitals reviewed. Constitutional: He appears well-developed and well-nourished.  HENT:  Head: Normocephalic.       Left crani incision clean and dry with staples in place. Dry crusted blood along incision line.   Eyes: Pupils are equal,   round, and reactive to light.  Neck: Normal range of motion.  Cardiovascular: Normal rate and regular rhythm.     Pulmonary/Chest: Effort normal and breath sounds normal.  Abdominal: Soft. Bowel sounds are normal.  Musculoskeletal: He exhibits no edema.  Neurological: He is alert.       Oriented to self and place today.  Able to state date but unable to state situation. Can identify 25% simple objects.  Unaware of current deficits. Poor insight.  Verbal output improved. Right sided weakness UE>LE--?motor apraxia RUE is grossly 2-3/5. RLE is 2/5 prox to 1/5 distally. Withdraws to pain on the right side.  Right facial weakness with tongue deviaition.   Speech clear but slow rate. Distracted. Able to follow simple one and two step commands.   Skin: Skin is warm and dry.  Psychiatric:       Impulsive, repeating reaching for head, incision. Overall pleasant and seems to be in good spiritsl    Results for orders placed during the hospital encounter of 04/28/12 (from the past 48 hour(s))  BASIC METABOLIC PANEL     Status: Abnormal   Collection Time   05/06/12  6:44 AM      Component Value Range Comment   Sodium 136  135 - 145 mEq/L    Potassium 4.3  3.5 - 5.1 mEq/L    Chloride 104  96 - 112 mEq/L    CO2 22  19 - 32 mEq/L    Glucose, Bld 101 (*) 70 - 99 mg/dL    BUN 14  6 - 23 mg/dL    Creatinine, Ser 0.56  0.50 - 1.35 mg/dL    Calcium 8.9  8.4 - 10.5 mg/dL    GFR calc non Af Amer >90  >90 mL/min    GFR calc Af Amer >90  >90 mL/min   BASIC METABOLIC PANEL     Status: Abnormal   Collection Time   05/07/12  4:05 AM      Component Value Range Comment   Sodium 134 (*) 135 - 145 mEq/L    Potassium 3.8  3.5 - 5.1 mEq/L    Chloride 101  96 - 112 mEq/L    CO2 20  19 - 32 mEq/L    Glucose, Bld 99  70 - 99 mg/dL    BUN 15  6 - 23 mg/dL    Creatinine, Ser 0.59  0.50 - 1.35 mg/dL    Calcium 8.9  8.4 - 10.5 mg/dL    GFR calc non Af Amer >90  >90 mL/min    GFR calc Af Amer >90  >90 mL/min    No results found.  Post Admission Physician Evaluation: 1. 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 2. Patient is admitted to receive collaborative, interdisciplinary care between the physiatrist, rehab nursing staff, and therapy team. 3. Patient's level of medical complexity and substantial therapy needs in context of that medical necessity cannot be provided at a lesser intensity of care such as a SNF. 4. Patient has experienced substantial functional loss from his/her baseline which was documented above under the "Functional History" and "Functional Status" headings.  Judging by the patient's diagnosis, physical exam, and functional history, the patient has potential for functional progress which will result in measurable gains while on inpatient rehab.  These gains will be of substantial and practical use upon discharge  in facilitating mobility and self-care at the household level. 5. Physiatrist will provide 24 hour management   of medical needs as well as oversight of the therapy plan/treatment and provide guidance as appropriate regarding the interaction of the two. 6. 24 hour rehab nursing will assist with bladder management, bowel management, safety, skin/wound care, disease management, medication administration, pain management and patient education  and help integrate therapy concepts, techniques,education, etc. 7. PT will assess and treat for:  fxnl mobility, NMR, CPT, safety, strength, family and pt ed.  Goals are: supervision to min assist. 8. OT will assess and treat for: UES, ADL's, safety, fxnl mobility, NMR, CPT, adaptive equipment.   Goals are: supervision to minimal assist. 9. SLP will assess and treat for: cognition, communication, swallowing.  Goals are: supervision to minimal assist. 10. Case Management and Social Worker will assess and treat for psychological issues and discharge planning. 11. Team conference will be held weekly to assess progress toward goals and to determine barriers to discharge. 12. Patient will receive at least 3 hours of therapy  per day at least 5 days per week. 13. ELOS: 3 weeks      Prognosis:  excellent   Medical Problem List and Plan: 1. DVT Prophylaxis/Anticoagulation: Mechanical: Sequential compression devices, below knee Bilateral lower extremities 2. Pain Management: denies any pain. 3. Mood: no signs of distress. Patient with significant cognitive deficits with poor awareness and impaired insight.  4. Neuropsych: This patient is not capable of making decisions on his/her own behalf. 5. ABLA: recheck in am.  6. RUE DVT: no blood thinner for now per NS. 7. Polysubstance abuse: agitation resolved. Denies any problems with alcohol/drug use. Will continue to monitor, but overall behavior is quite pleasant at present.  8. HTN: off BP meds at current time. Monitor with bid checks for trends.    05/07/2012, Zach Swartz, MD 

## 2012-05-08 NOTE — Evaluation (Signed)
Physical Therapy Assessment and Plan  Patient Details  Name: Wayne Buckley MRN: [id number] Date of Birth: 12-Aug-1950  PT Diagnosis: Abnormality of gait, Cognitive deficits, Coordination disorder and Muscle weakness Rehab Potential: Good ELOS: 2-3 weeks   Today's Date: 05/08/2012 [phone] 60 minutes  Problem List:  Patient Active Problem List  Diagnosis  . Acute respiratory failure  . Subdural hematoma, post-traumatic  . Aspiration pneumonia  . Alcohol abuse  . Hypokalemia  . Urinary retention  . Encephalopathy acute    Past Medical History:  Past Medical History  Diagnosis Date  . Hypertension    Past Surgical History:  Past Surgical History  Procedure Date  . Craniotomy 04/28/2012    Procedure: CRANIOTOMY HEMATOMA EVACUATION SUBDURAL;  Surgeon: Reinaldo Meeker, MD;  Location: [facility] OR;  Service: Neurosurgery;  Laterality: Left;  Left Craniotomy for Subdural Hematoma    Assessment & Plan Clinical Impression: Patient is a 61 y.o. year old male  found down and noted to be comatose at admission on 04/28/12. UDS + for THC and ETOH @121 . CT head with left frontal lobe IPH, left frontal SDH, L-SAH, left to right midline shift with suspicion of early uncal herniation. Patient intubated for airway protection and underwent L-crani for evacuation of SDH, left frontal hemorrhage and partial left frontal lobectomy the same day. Patient transferred to CIR on 05/07/2012 .   Patient currently requires total with mobility secondary to muscle weakness, impaired timing and sequencing, unbalanced muscle activation, motor apraxia, decreased coordination and decreased motor planning, decreased attention, decreased awareness, decreased problem solving, decreased safety awareness and delayed processing and decreased sitting balance, decreased standing balance, decreased postural control and decreased balance strategies.  Prior to hospitalization, patient was independent with mobility and lived with  Spouse in a House home.  Home access is 3Stairs to enter.  Patient will benefit from skilled PT intervention to maximize safe functional mobility, minimize fall risk and decrease caregiver burden for planned discharge home with 24 hour supervision.  Anticipate patient will benefit from follow up OP at discharge.  PT - End of Session Activity Tolerance: Tolerates 30+ min activity with multiple rests PT Assessment Rehab Potential: Good PT Plan PT Frequency: 1-2 X/day, 60-90 minutes Estimated Length of Stay: 2-3 weeks PT Treatment/Interventions: Ambulation/gait training;Community reintegration;DME/adaptive equipment instruction;Neuromuscular re-education;Therapeutic Exercise;UE/LE Strength taining/ROM;Splinting/orthotics;Pain management;Discharge planning;Balance/vestibular training;Cognitive remediation/compensation;Patient/family education;Functional mobility training;Therapeutic Activities;Stair training;UE/LE Coordination activities;Wheelchair propulsion/positioning PT Recommendation Follow Up Recommendations: Outpatient PT  PT Evaluation Precautions/Restrictions Precautions Precautions: Fall Restrictions Weight Bearing Restrictions: No  Pain Pain Assessment Pain Assessment: No/denies pain Home Living/Prior Functioning Home Living Lives With: Spouse Available Help at Discharge: Family Type of Home: House Home Access: Stairs to enter Secretary/administrator of Steps: 3 Entrance Stairs-Rails: Can reach both Home Layout: Two level;Able to live on main level with bedroom/bathroom Prior Function Level of Independence: Independent with basic ADLs;Independent with gait;Independent with transfers Able to Take Stairs?: Yes Driving: Yes Vocation: Part time employment  Cognition Overall Cognitive Status: Impaired Arousal/Alertness: Awake/alert Attention: Selective Selective Attention: Impaired Selective Attention Impairment: Verbal basic;Functional basic Awareness:  Impaired Awareness Impairment: Intellectual impairment Safety/Judgment: Impaired Rancho Mirant Scales of Cognitive Functioning: Confused/appropriate Sensation Sensation Light Touch Impaired Details: Impaired RUE Proprioception Impaired Details: Impaired RUE Coordination Gross Motor Movements are Fluid and Coordinated: No Coordination and Movement Description: impaired motor planning and smoothness of movement Motor  Motor Motor: Motor apraxia;Abnormal postural alignment and control  Mobility Bed Mobility Supine to Sit: 4: Min assist Supine to Sit Details (indicate cue type  and reason): impaired motor planning, assist for trunk balance and support Transfers Stand Pivot Transfers: 3: Mod assist Stand Pivot Transfer Details (indicate cue type and reason): assist for wt shifts, turning, motor planning and sequencing Locomotion  Ambulation Ambulation: Yes Ambulation/Gait Assistance: 1: +2 Total assist Ambulation Distance (Feet): 20 Feet Assistive device: 2 person hand held assist Ambulation/Gait Assistance Details: assist for wt shifts, toe out/hips ER bilaterally, fwd lean, cues for posture, leans to R Stairs / Additional Locomotion Stairs: Yes Stairs Assistance: 3: Mod assist;1: +2 Total assist Stairs Assistance Details (indicate cue type and reason): +2 assist for safety, needs assist with eccentric control, cues for attention to R UE Stair Management Technique: Two rails Number of Stairs: 5  Wheelchair Mobility Wheelchair Mobility: Yes Wheelchair Assistance: 4: Min Education officer, museum: Both upper extremities Wheelchair Parts Management: Needs assistance Distance: 45'  Trunk/Postural Assessment  Cervical Assessment Cervical Assessment: Within Functional Limits Thoracic Assessment Thoracic Assessment: Within Functional Limits Lumbar Assessment Lumbar Assessment: Within Functional Limits Postural Control Postural Control: Deficits on evaluation Righting  Reactions: delayed Protective Responses: delayed  Balance Static Sitting Balance Static Sitting - Balance Support: Bilateral upper extremity supported Static Sitting - Level of Assistance: 5: Stand by assistance Dynamic Sitting Balance Dynamic Sitting - Level of Assistance: 3: Mod assist Dynamic Sitting - Comments: leans to R Static Standing Balance Static Standing - Level of Assistance: 2: Max assist Static Standing - Comment/# of Minutes: R lean, B knees flexed Extremity Assessment      RLE Assessment RLE Assessment:  (grossly 3-/5, significant hip ER tightness) LLE Assessment LLE Assessment:  (grossly 3-/5, significant hip ER tightness)  See FIM for current functional status Refer to Care Plan for Long Term Goals  Recommendations for other services: None  Discharge Criteria: Patient will be discharged from PT if patient refuses treatment 3 consecutive times without medical reason, if treatment goals not met, if there is a change in medical status, if patient makes no progress towards goals or if patient is discharged from hospital.  The above assessment, treatment plan, treatment alternatives and goals were discussed and mutually agreed upon: by patient and by family  Treatment initiated during session: Gait training with RW with continued toe out posture, forward lean, able to gait with mod A with max cues for safety and sequencing.  Pt noted to have less lean to the R when walking.  Static standing balance reaching/bending task with mod-total A as pt has delayed balance reactions, unable to correct LOB.  Reaching with focus on B knee extension.  Pt able to perform cognitive direction following activity for following 3 step commands without assist.  Able to perform sorting activity with min A, able to self correct without cuing.  Overall pt required only min cues for selective attention in mod distracting environment, unaware of LOB vs delayed reactions limiting  safety.  Kaavya Puskarich 05/08/2012, 5:33 PM

## 2012-05-08 NOTE — Progress Notes (Signed)
Physical Therapy Session Note  Patient Details  Name: Wayne Buckley MRN: [id number] Date of Birth: 07-29-1950  Today's Date: 05/08/2012 Time: 1300-1330 Time Calculation (min): 30 min  Skilled Therapeutic Interventions/Progress Updates:    Treatment focused on standing balance, posture, sit to stands, and standing tolerance while completing a cognitive task to spell words with letters placed on board. Pt unable to create a word from the jumbled letters, but when verbally given a word able to correctly select and sequence letters without cues. Required mod A for sit to stands and overall min A for balance with 1 UE support. Required cues for awareness to R foot positioning.   Therapy Documentation Precautions:  Precautions Precautions: Fall Precaution Comments: DVT in right UE- restricted  Restrictions Weight Bearing Restrictions: No   Pain: Pain Assessment Pain Assessment: No/denies pain  See FIM for current functional status  Therapy/Group: Individual Therapy  Karolee Stamps [facility] 05/08/2012, 1:34 PM

## 2012-05-09 ENCOUNTER — Encounter (HOSPITAL_COMMUNITY): Payer: 59 | Admitting: Occupational Therapy

## 2012-05-09 ENCOUNTER — Inpatient Hospital Stay (HOSPITAL_COMMUNITY): Payer: 59 | Admitting: Physical Therapy

## 2012-05-09 ENCOUNTER — Inpatient Hospital Stay (HOSPITAL_COMMUNITY): Payer: 59 | Admitting: Speech Pathology

## 2012-05-09 ENCOUNTER — Inpatient Hospital Stay (HOSPITAL_COMMUNITY): Payer: 59 | Admitting: Occupational Therapy

## 2012-05-09 LAB — URINE CULTURE: Colony Count: NO GROWTH

## 2012-05-09 NOTE — Progress Notes (Signed)
Physical Therapy Note  Patient Details  Name: Wayne Buckley MRN: [id number] Date of Birth: 10-02-1950 Today's Date: 05/09/2012  Time: 1030-1127 57 minutes  No c/o pain.  Gait training with RW with mod A 50' x 2 with manual facilitation for wt shifts, cues for posture and safety, assist to advance RW.  Pt tends to drag feet vs stepping, able to improve with manual and verbal cues for a short distance, returns to shuffling with fatigue.  Standing pre gait training with and without AD with mod A with AD, max-total A for balance without AD.  Focus on lifting feet, wt shifts, grading movements.  Pt with more writhing, athetoid-like movements today, decreased trunk control.  Standing tolerance/balance with min A 3 x 5 minutes for pipe tree activity.  Pt able to correctly build pipe tree without cues, able to problem solve and correct errors with min cuing.  Standing balance with min A, knees tend to flex with fatigue, pt requires max cuing to keep knees and back extended.  Individual therapy   Salvador Bigbee 05/09/2012, 11:28 AM

## 2012-05-09 NOTE — Progress Notes (Signed)
Physical Therapy Note  Patient Details  Name: Wayne Buckley MRN: [id number] Date of Birth: 01-23-1951 Today's Date: 05/09/2012  Time: 1330-1400 30 minutes  No c/o pain.  Gait with +2 HHA with focus on trunk and knee extension, increasing step length, decreasing foot drag.  Pt required assist for wt shifts and max cuing to decrease toe out bilaterally.  Pt able to correct toe out 1-2 steps but then returns to this posture.  Closed chain NMR in quadruped for core and hip stability.  Pt requires min-mod A for wt shifts and for maintaining this position.  Significant core weakness, R > L.  Tall kneeling without UE support 3 x 30 seconds with mod-max A to maintain.  Pt requires heavy UE assist for full upright posture in tall kneeling.  Pt unable to balance to perform reaching tasks in tall kneeling.  Pt fatigues easily with this exercise. Supine NMR with adductor squeeze to promote B adductor strength with bridging, LTR and LE lifts.  Pt fatigues quickly but is able to activate adductors briefly during all exercises.  Individual therapy   Kenzel Ruesch 05/09/2012, 5:22 PM

## 2012-05-09 NOTE — Progress Notes (Signed)
Pt sitting in wheelchair at nursing station.  Safety belt around waist and wheelchair is secured.  Pt does not complain of pain.  No needs at this time.  Wayne Buckley [place]

## 2012-05-09 NOTE — Progress Notes (Signed)
Speech Language Pathology Daily Session Note  Patient Details  Name: Wayne Buckley MRN: [id number] Date of Birth: 06/24/1950  Today's Date: 05/09/2012 Time: 0800-0900 Time Calculation (min): 60 min  Short Term Goals: Week 1: SLP Short Term Goal 1 (Week 1): Pt will consume trials of Dys. 3 textures with efficient mastication with Mod verbal cues.  SLP Short Term Goal 2 (Week 1): Pt will utilize swallowing compensatory strategies while consuming current diet with Mod verbal and visual cues.  SLP Short Term Goal 3 (Week 1): Pt will identify common objects with Min phonemic cues.  SLP Short Term Goal 4 (Week 1): Pt will identify 1 physical and 1 cognitive deficit with Max multimodal cueing SLP Short Term Goal 5 (Week 1): Pt will demonstrate selective attention to a functional task for 15 minutes with Max verbal cues for redirection  SLP Short Term Goal 6 (Week 1): Pt will utilize call bell to express wants/needs with Mod multimodal cueing.   Skilled Therapeutic Interventions: Treatment focus on dysphagia and language goals. Pt consumed Dys. 2 textures and thin liquids without overt s/s of aspiration and required Min verbal cues to utilize small bites and mod verbal and question cues to self-monitor right lateral sulci pocketing.  Pt identified all items on the tray with 100% accuracy but required Mod question cues to self-monitor and correct neologisms and semantic paraphasias at the phrase level during structured conversation. Pt's wife present and educated on current SLP goals and strategies to utilize to increase functional communication. Pt's wife verbalized and demonstrated understanding.    FIM:  Comprehension Comprehension Mode: Auditory Comprehension: 3-Understands basic 50 - 74% of the time/requires cueing 25 - 50%  of the time Expression Expression Mode: Verbal Expression: 2-Expresses basic 25 - 49% of the time/requires cueing 50 - 75% of the time. Uses single words/gestures. Social  Interaction Social Interaction: 3-Interacts appropriately 50 - 74% of the time - May be physically or verbally inappropriate. Problem Solving Problem Solving: 2-Solves basic 25 - 49% of the time - needs direction more than half the time to initiate, plan or complete simple activities Memory Memory: 3-Recognizes or recalls 50 - 74% of the time/requires cueing 25 - 49% of the time FIM - Eating Eating Activity: 5: Needs verbal cues/supervision  Pain Pain Assessment Pain Assessment: No/denies pain  Therapy/Group: Individual Therapy  Shericka Johnstone 05/09/2012, 10:11 AM

## 2012-05-09 NOTE — Progress Notes (Signed)
Subjective/Complaints: No problems overnight. Denies pain. Participated in all therapies A 12 point review of systems has been performed and if not noted above is otherwise negative.   Objective: Vital Signs: Blood pressure 156/89, pulse 68, temperature 98.3 F (36.8 C), temperature source Oral, resp. rate 20, weight 79.8 kg (175 lb 14.8 oz), SpO2 97.00%. No results found.  Basename 05/08/12 0610  WBC 9.4  HGB 12.1*  HCT 35.1*  PLT 555*    Basename 05/08/12 0610 05/07/12 0405  NA 134* 134*  K 3.8 3.8  CL 102 101  CO2 23 20  GLUCOSE 102* 99  BUN 14 15  CREATININE 0.61 0.59  CALCIUM 9.2 8.9   CBG (last 3)  No results found for this basename: GLUCAP:3 in the last 72 hours  Wt Readings from Last 3 Encounters:  05/08/12 79.8 kg (175 lb 14.8 oz)  05/04/12 83.9 kg (184 lb 15.5 oz)  05/04/12 83.9 kg (184 lb 15.5 oz)    Physical Exam:  Constitutional: He appears well-developed and well-nourished.  HENT:  Head: Normocephalic.  Left crani incision clean and dry with staples in place. Dry crusted blood along incision line.  Eyes: Pupils are equal, round, and reactive to light.  Neck: Normal range of motion.  Cardiovascular: Normal rate and regular rhythm.  Pulmonary/Chest: Effort normal and breath sounds normal.  Abdominal: Soft. Bowel sounds are normal.  Musculoskeletal: He exhibits no edema.  Neurological: He is alert.  Oriented to self and place today. Able to state date but unable to state situation. Can identify 25% simple objects. Decreased awareness of current deficits. Poor insight.  Right sided weakness UE>LE--?motor apraxia RUE is grossly 3-4/5. RLE is 2-3/5 prox to 2+/5 distally. Withdraws to pain on the right side. Right facial weakness with tongue deviaition. Speech clear but slow rate. Distracted. Able to follow simple one and two step commands.  Skin: Skin is warm and dry.  Psychiatric:    calm, collected    Assessment/Plan: 1. 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 Physiatrist is providing close team supervision and 24 hour management of active medical problems listed below. Physiatrist and rehab team continue to assess barriers to discharge/monitor patient progress toward functional and medical goals. FIM:    FIM - Upper Body Dressing/Undressing Upper body dressing/undressing steps patient completed: Thread/unthread right sleeve of pullover shirt/dresss;Thread/unthread left sleeve of pullover shirt/dress Upper body dressing/undressing: 3: Mod-Patient completed 50-74% of tasks FIM - Lower Body Dressing/Undressing Lower body dressing/undressing: 1: Total-Patient completed less than 25% of tasks        FIM - Bed/Chair Transfer Bed/Chair Transfer: 3: Chair or W/C > Bed: Mod A (lift or lower assist);3: Bed > Chair or W/C: Mod A (lift or lower assist)  FIM - Locomotion: Wheelchair Distance: 45' Locomotion: Wheelchair: 1: Travels less than 50 ft with minimal assistance (Pt.>75%) FIM - Locomotion: Ambulation Ambulation/Gait Assistance: 1: +2 Total assist Locomotion: Ambulation: 1: Two helpers  Comprehension Comprehension Mode: Auditory Comprehension: 3-Understands basic 50 - 74% of the time/requires cueing 25 - 50%  of the time  Expression Expression Mode: Verbal Expression: 2-Expresses basic 25 - 49% of the time/requires cueing 50 - 75% of the time. Uses single words/gestures.  Social Interaction Social Interaction: 3-Interacts appropriately 50 - 74% of the time - May be physically or verbally inappropriate.  Problem Solving Problem Solving: 2-Solves basic 25 - 49% of the time -  needs direction more than half the time to initiate, plan or complete simple activities  Memory Memory: 2-Recognizes or recalls 25 - 49% of the time/requires cueing 51 - 75% of the time  Medical Problem List and Plan:  1.  DVT Prophylaxis/Anticoagulation: Mechanical: Sequential compression devices, below knee Bilateral lower extremities  2. Pain Management: denies any pain.  3. Mood: no signs of distress. Patient with significant cognitive deficits with poor awareness and impaired insight.  4. Neuropsych: This patient is not capable of making decisions on his/her own behalf.  5. ABLA:improved 12.1 6. RUE DVT: no blood thinner for now per NS.  7. Polysubstance abuse: agitation resolved. Denies any problems with alcohol/drug use. Will continue to monitor, but overall behavior is quite pleasant at present.  8. HTN: off BP meds at current time. Monitor with bid checks for trends.  9. Urine retention/neurogenic bladder: dc foley, begin training, urine negative so far    LOS (Days) 2 A FACE TO FACE EVALUATION WAS PERFORMED  SWARTZ,ZACHARY T 05/09/2012, 7:10 AM

## 2012-05-09 NOTE — Progress Notes (Signed)
Physical Therapy Session Note  Patient Details  Name: Wayne Buckley MRN: [id number] Date of Birth: 1950-09-21  Today's Date: 05/09/2012 Time: 0900-0928 Time Calculation (min): 28 minutes  Short Term Goals: Week 1:  PT Short Term Goal 1 (Week 1): Pt will perform functional transfers min A PT Short Term Goal 2 (Week 1): Pt will demo intellectual awareness with min A PT Short Term Goal 3 (Week 1): Pt will gait with min A 50' in controlled environment  Skilled Therapeutic Interventions/Progress Updates: Instructed patient in sit/stand transfers to/from WC/RW for improved safety and improved initial standing balance.  Patient required mod-max assist for verbal, visual, and tactile cuing to complete elements of safe sit/stand transfer (brakes set on wheel chair, scooting forward, pulling feet back, hands on armrests, pushing up from wheelchair, taking hold of walker) and elements of safe stand/sit transfer (back up to feel chair with legs, reach back for arm of wheelchair with one hand, lower self slowly into chair).  By conclusion of session, patient was able to recall some but not all of elements of transfer.  Patient performed 4 sit/stand/sit transfers to/from WC/RW with mod-max A for cuing.  Patient performed 3 bouts of standing at [facility] for 3 minutes per bout for improved endurance and standing tolerance.  Gait training with patient at RW on carpeted surface in quiet environment 5' with tactile facilitation of right LE and verbal cuing to sequence movement of walker and lower extremities.  Patient performed 3 bouts of ambulation for 5' each bout.  Patient required seated rest break between bouts and responded well with quiet environment.     Therapy Documentation Precautions:  Precautions Precautions: Fall Precaution Comments: DVT in right UE- restricted  Restrictions Weight Bearing Restrictions: No Pain: Pain Assessment Pain Assessment: No/denies pain  See FIM for current functional  status  Therapy/Group: Individual Therapy  Rexene Agent 05/09/2012, 10:16 AM

## 2012-05-09 NOTE — Progress Notes (Signed)
Occupational Therapy Session Note  Patient Details  Name: WAVERLY CHAVARRIA MRN: [id number] Date of Birth: [birth year]  Today's Date: 05/09/2012 Time: 0930-1030 Time Calculation (min): 60 min   Skilled Therapeutic Interventions/Progress Updates:    1:1 self care retraining at shower level: including bathing, dressing, and grooming: with focus on attention to task, sustained to selective attention, use of right UE as dominant in all tasks with mod cuing, sit to stands, standing balance stand pivot transfers, dressing with attention to mistakes.    1:1 2nd session 13:00-13:30 focus on selective attention in min distracting environment, simple mental math, working memory, functional communicaiton while engaging in playing card game black jack. Functional ambulation with +2 with focus on upright posture, attention to right LE position.   Therapy Documentation Precautions:  Precautions Precautions: Fall Precaution Comments: DVT in right UE- restricted  Restrictions Weight Bearing Restrictions: No Pain:  no c/o pain in either session  See FIM for current functional status  Therapy/Group: Individual Therapy  Roney Mans [facility] 05/09/2012, 3:56 PM

## 2012-05-10 ENCOUNTER — Inpatient Hospital Stay (HOSPITAL_COMMUNITY): Payer: 59

## 2012-05-10 ENCOUNTER — Inpatient Hospital Stay (HOSPITAL_COMMUNITY): Payer: 59 | Admitting: Occupational Therapy

## 2012-05-10 ENCOUNTER — Inpatient Hospital Stay (HOSPITAL_COMMUNITY): Payer: 59 | Admitting: Physical Therapy

## 2012-05-10 ENCOUNTER — Inpatient Hospital Stay (HOSPITAL_COMMUNITY): Payer: 59 | Admitting: Speech Pathology

## 2012-05-10 DIAGNOSIS — W19XXXA Unspecified fall, initial encounter: Secondary | ICD-10-CM

## 2012-05-10 DIAGNOSIS — F1010 Alcohol abuse, uncomplicated: Secondary | ICD-10-CM

## 2012-05-10 DIAGNOSIS — S069X9A Unspecified intracranial injury with loss of consciousness of unspecified duration, initial encounter: Secondary | ICD-10-CM

## 2012-05-10 NOTE — Progress Notes (Signed)
Speech Language Pathology Daily Session Note  Patient Details  Name: Wayne Buckley MRN: [id number] Date of Birth: August 12, 1950  Today's Date: 05/10/2012 Time: 0800-0855 Time Calculation (min): 55 min  Short Term Goals: Week 1: SLP Short Term Goal 1 (Week 1): Pt will consume trials of Dys. 3 textures with efficient mastication with Mod verbal cues.  SLP Short Term Goal 2 (Week 1): Pt will utilize swallowing compensatory strategies while consuming current diet with Mod verbal and visual cues.  SLP Short Term Goal 3 (Week 1): Pt will identify common objects with Min phonemic cues.  SLP Short Term Goal 4 (Week 1): Pt will identify 1 physical and 1 cognitive deficit with Max multimodal cueing SLP Short Term Goal 5 (Week 1): Pt will demonstrate selective attention to a functional task for 15 minutes with Max verbal cues for redirection  SLP Short Term Goal 6 (Week 1): Pt will utilize call bell to express wants/needs with Mod multimodal cueing.   Skilled Therapeutic Interventions: Treatment focus on dysphagia and language goals. Pt consumed Dys. 2 textures and thin liquids without overt s/s of aspiration and required Mod verbal cues to utilize small bites and min verbal and question cues to self-monitor right lateral sulci pocketing.  Pt required Mod verbal cues for redirection to self-feeding task in a mildly distracting environment. Pt required Min phonemic and semantic cues for identification of common items on his tray. Pt requested to use bathroom but required Max A for safety and emergent awareness into deficits.     FIM:  Comprehension Comprehension: 3-Understands basic 50 - 74% of the time/requires cueing 25 - 50%  of the time Expression Expression: 3-Expresses basic 50 - 74% of the time/requires cueing 25 - 50% of the time. Needs to repeat parts of sentences. Social Interaction Social Interaction: 3-Interacts appropriately 50 - 74% of the time - May be physically or verbally  inappropriate. Problem Solving Problem Solving: 2-Solves basic 25 - 49% of the time - needs direction more than half the time to initiate, plan or complete simple activities Memory Memory: 3-Recognizes or recalls 50 - 74% of the time/requires cueing 25 - 49% of the time FIM - Eating Eating Activity: 5: Needs verbal cues/supervision  Pain Pain Assessment Pain Assessment: No/denies pain  Therapy/Group: Individual Therapy  Vitaliy Eisenhour 05/10/2012, 11:55 AM

## 2012-05-10 NOTE — Progress Notes (Signed)
Occupational Therapy Session Note  Patient Details  Name: Wayne Buckley MRN: [id number] Date of Birth: 03/20/1951  Today's Date: 05/10/2012 Time: 1415-1500 Time Calculation (min): 45 min  Short Term Goals: Week 1:  OT Short Term Goal 1 (Week 1): Pt will don shirt with supervision OT Short Term Goal 2 (Week 1): Pt will maintain selective attention with min A during funcitonal tasks OT Short Term Goal 3 (Week 1): Pt will perform toileting with mod A sit to stand OT Short Term Goal 4 (Week 1): Pt will demonstrate safety awareness by using the call bell for needs 25% of day OT Short Term Goal 5 (Week 1): Pt will demonstrate intellecual awareness with mod A during functional ADL tasks  Skilled Therapeutic Interventions/Progress Updates:    1:1 focus on transitional mobility (stand pivot transfers, supine <>sit); therapeutic activities to work on right hip weakness and tightness in supine with bridging and stretching, sit to stand, standing balance with weight shifting in order to toe tap on a box (bilaterally)- significant posterior lean with weight shift to right to tap box with left foot, eccentric control with movement, activity tolerance, hand eye coordination catching a ball and naming food items. Pt with increased fatigue at end of session requesting to lay down in bed.   Therapy Documentation Precautions:  Precautions Precautions: Fall Precaution Comments: DVT in right UE- restricted  Restrictions Weight Bearing Restrictions: No Pain: Pain Assessment Pain Assessment: No/denies pain  See FIM for current functional status  Therapy/Group: Individual Therapy  Roney Mans [facility] 05/10/2012, 3:15 PM

## 2012-05-10 NOTE — Progress Notes (Signed)
Physical Therapy Note  Patient Details  Name: Wayne Buckley MRN: [id number] Date of Birth: 10-29-1950 Today's Date: 05/10/2012  Time: 1300-1345 45 minutes  No c/o pain.  Treatment focused on NMR with core stability and activation activities.  Step ups with B UE support with mod A.  Pt with noted increase in ataxic movements, difficulty placing foot on step.  Pt requires max-total cues for safety.  Sit to stands with and without UE support with pt fatiguing quickly, requiring frequent rest.  Sit to stands and squats with adductor squeeze, pt able to perform 2-3 reps before adductors fatigue.  Squatting and scooting with wt shifts focus on grading movement with mod - max A for grading movements.  Gait training with RW 2 x 60' with mod A.  Pt requires max cuing for posture and safety with RW.  Pt continues to be limited by decreased awareness into deficits, impaired safety and impaired core/trunk control, limb ataxia.  Individual therapy   Mckennon Zwart 05/10/2012, 2:52 PM

## 2012-05-10 NOTE — Progress Notes (Signed)
Occupational Therapy Session Note  Patient Details  Name: Wayne Buckley MRN: [id number] Date of Birth: 09-Apr-1951  Today's Date: 05/10/2012 Time: 1100-1200 Time Calculation (min): 60 min  Short Term Goals: Week 1:  OT Short Term Goal 1 (Week 1): Pt will don shirt with supervision OT Short Term Goal 2 (Week 1): Pt will maintain selective attention with min A during funcitonal tasks OT Short Term Goal 3 (Week 1): Pt will perform toileting with mod A sit to stand OT Short Term Goal 4 (Week 1): Pt will demonstrate safety awareness by using the call bell for needs 25% of day OT Short Term Goal 5 (Week 1): Pt will demonstrate intellecual awareness with mod A during functional ADL tasks  Skilled Therapeutic Interventions/Progress Updates:    Pt in bed resting but agreeable to participating in bathing and dressing tasks.  Pt completed bathing with sit<>stand from shower chair and completed dressing tasks with sit<>stand from w/c at sink.  Pt attempted to don/thread pants but pants became disoriented and patient unable to straighten pants out.  Increased assistance provided to reorient pants and start over.  Pt initiated shaving tasks with increased RUE control noted when shaving right side of face.  Pt required assistance with left side of face.  Pt brushed teeth without assistance.  Focus on safety awareness, decreased impulsiveness, standing balance, and transfers.  Therapy Documentation Precautions:  Precautions Precautions: Fall Precaution Comments: DVT in right UE- restricted  Restrictions Weight Bearing Restrictions: No   Pain: Pain Assessment Pain Assessment: No/denies pain  See FIM for current functional status  Therapy/Group: Individual Therapy  Rich Brave 05/10/2012, 12:05 PM

## 2012-05-10 NOTE — Progress Notes (Signed)
Social Work  Social Work Assessment and Plan  Patient Details  Name: Wayne Buckley MRN: [id number] Date of Birth: [birth year]  Today's Date: 05/10/2012  Problem List:  Patient Active Problem List  Diagnosis  . Acute respiratory failure  . Subdural hematoma, post-traumatic  . Aspiration pneumonia  . Alcohol abuse  . Hypokalemia  . Urinary retention  . Encephalopathy acute   Past Medical History:  Past Medical History  Diagnosis Date  . Hypertension    Past Surgical History:  Past Surgical History  Procedure Date  . Craniotomy 04/28/2012    Procedure: CRANIOTOMY HEMATOMA EVACUATION SUBDURAL;  Surgeon: Reinaldo Meeker, MD;  Location: [facility] OR;  Service: Neurosurgery;  Laterality: Left;  Left Craniotomy for Subdural Hematoma   Social History:  reports that he has never smoked. He has never used smokeless tobacco. He reports that he drinks alcohol. His drug history not on file.  Family / Support Systems Marital Status: Married How Long?: 20 years Patient Roles: Spouse;Parent;Other (Comment) (land lord) Spouse/Significant Other: wife, Wayne Buckley @ [phone] or ([phone] Children: one daughter, age 40 yrs, Wayne Buckley Other Supports: no other local family Anticipated Caregiver: wife and maybe may hire caregivers; wife FMLA Ability/Limitations of Caregiver: may take FMLA initially Caregiver Availability: 24/7 (works days at [employer] for 33 years, however, has Transport planner ) Family Dynamics: pt and wife describe marriage as very stable;  no significant stressors or conflicts  Social History Preferred language: English Religion: Catholic Cultural Background: NA Education: college Read: Yes Write: Yes Employment Status: Employed 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 Wife steps in to clarify information from pt.  Pt appears very calm and never becomes frustrated with process.  Wife describes pt as "...always been very laid back and relaxed..." Recent Psychosocial Issues: None Pyschiatric History: None Substance Abuse History: None  Patient / Family Perceptions, Expectations & Goals Pt/Family understanding of illness & functional limitations: Pt not really able to explain his injuries.  Rambles about something happening with his dog.  When asked if he suffered a brain injury, pt does confirm this.  Wife with basic understanding of  pt's injury and potential adjustment issues they may encounter through recovery.  Education will be ongoing. 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 Anticipated changes in roles/activities/participation: Pt will require 24/7 supervision at least and wife will need to assume the caregiver role.  Teen daughter will likely see many changes in her role in the family as well. Pt/family expectations/goals: Wife hopeful that he can reach a fairly independent functional level by d/c.  She understands that he must have 24/7 supervision.  Community Resources [employer]: None Premorbid Home Care/DME Agencies: None Transportation available at discharge: yes Resource referrals recommended: Neuropsychology;Support group (specify) (local TBI group)  Discharge Planning Living Arrangements: Spouse/significant other;Children (wife and 80 year old daughter) Support Systems: Spouse/significant other;Children;Other relatives;Friends/neighbors;Church/faith  community Type of Residence: Private residence Insurance Resources: Media planner (specify) Education officer, museum) Financial Resources: SSI;Family Support Financial Screen Referred: No Living Expenses: Dietitian  Money Management: Spouse;Patient Do you have any problems obtaining your medications?: No Home Management: shared among family Patient/Family Preliminary Plans: Pt to d/c home with wife and 90 y.o. daughter.  Wife will likely take some FMLA time and may hire a private caregiver once she returns to work. Expected length of stay: [facility]OS 2-3 weeks  Clinical Impression Pleasant, relaxed gentleman here after fall and TBI with significant cognitive deficits.  [name] and very supportive, however, she is the primary wage earner of the family.  Will benefit from ongoing education with wife and have offered the same for daughter.  Will follow closely with pt and family for support and d/c planning.  [name] 05/10/2012, 2:20 PM

## 2012-05-10 NOTE — Progress Notes (Signed)
Physical Therapy Session Note  Patient Details  Name: Wayne Buckley MRN: [id number] Date of Birth: 03-10-1951  Today's Date: 05/10/2012 Time: 0900-0958 Time Calculation (min): 58 min  Short Term Goals: Week 1:  PT Short Term Goal 1 (Week 1): Pt will perform functional transfers min A PT Short Term Goal 2 (Week 1): Pt will demo intellectual awareness with min A PT Short Term Goal 3 (Week 1): Pt will gait with min A 50' in controlled environment  Skilled Therapeutic Interventions/Progress Updates:  Instructed patient in sit/stand transfers to/from WC/RW for improved safety and improved initial standing balance. Patient required mod-max assist for verbal, visual, and tactile cuing to complete elements of safe sit/stand/sit transfers.  Patient continues to demonstrate inconsistent recollection of elements of safe transfer technique.  Stand pivot transfers WC/mat table with RW and min-mod A for safe technique x4.  Patient repeatedly attempting to sit without backing completely to mat table and/or sitting without bringing walker back with him despite cuing to do so.  Worked with patient on dynamic sitting balance activities at edge of mat table with ball toss with +2 assist for safety for improved balance and trunk stability.  Patient performed activity with one minor loss of balance posteriorly with self-correction.  Worked with patient on standing balance activities at [facility] with overhead reaching bilaterally with +2 assist for safety without loss of balance but with complaint of fatigue.  Gait training with RW with min-mod +2 with manual facilitation of right LE for neutral foot position and upright posture in controlled environment 50' x 3 bouts with rest break between bouts.  Gait training one bout 85' without AD with HHA +2 with verbal cuing for upright posture and neutral foot position.         Therapy Documentation Precautions:  Precautions Precautions: Fall Precaution Comments: DVT in  right UE- restricted  Restrictions Weight Bearing Restrictions: No General:   Vital Signs:   Pain: Pain Assessment Pain Assessment: No/denies pain Mobility:   Locomotion : Ambulation Ambulation/Gait Assistance: 4: Min assist   See FIM for current functional status  Therapy/Group: Individual Therapy  Rexene Agent 05/10/2012, 10:04 AM

## 2012-05-10 NOTE — Progress Notes (Signed)
Subjective/Complaints: Slept well. Denies pain. Very alert A 12 point review of systems has been performed and if not noted above is otherwise negative.   Objective: Vital Signs: Blood pressure 106/81, pulse 88, temperature 98.6 F (37 C), temperature source Oral, resp. rate 18, weight 79.8 kg (175 lb 14.8 oz), SpO2 100.00%. No results found.  Basename 05/08/12 0610  WBC 9.4  HGB 12.1*  HCT 35.1*  PLT 555*    Basename 05/08/12 0610  NA 134*  K 3.8  CL 102  CO2 23  GLUCOSE 102*  BUN 14  CREATININE 0.61  CALCIUM 9.2   CBG (last 3)  No results found for this basename: GLUCAP:3 in the last 72 hours  Wt Readings from Last 3 Encounters:  05/08/12 79.8 kg (175 lb 14.8 oz)  05/04/12 83.9 kg (184 lb 15.5 oz)  05/04/12 83.9 kg (184 lb 15.5 oz)    Physical Exam:  Constitutional: He appears well-developed and well-nourished.  HENT:  Head: Normocephalic.  Left crani incision clean and dry with staples in place. Dry crusted blood along incision line.  Eyes: Pupils are equal, round, and reactive to light.  Neck: Normal range of motion.  Cardiovascular: Normal rate and regular rhythm.  Pulmonary/Chest: Effort normal and breath sounds normal.  Abdominal: Soft. Bowel sounds are normal.  Musculoskeletal: He exhibits no edema.  Neurological: He is alert.  Oriented to self and place today. Able to state date but unable to state situation. Can identify 25% simple objects. Decreased awareness of current deficits. Poor insight.  Right sided weakness UE>LE--?motor apraxia RUE is grossly 3+ to4/5. RLE is  3/5 prox to 3+/5 distally. Withdraws to pain on the right side. Right facial weakness with tongue deviaition. Speech clear but slow rate. Distracted. Able to follow simple one and two step commands.  Skin: Skin is warm and dry.  Psychiatric:    calm, collected    Assessment/Plan: 1. 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 Physiatrist is providing close team supervision and 24 hour management of active medical problems listed below. Physiatrist and rehab team continue to assess barriers to discharge/monitor patient progress toward functional and medical goals. FIM: FIM - Bathing Bathing Steps Patient Completed: Chest;Right Arm;Left Arm;Abdomen;Front perineal area;Right upper leg;Left upper leg Bathing: 4: Min-Patient completes 8-9 11f 10 parts or 75+ percent  FIM - Upper Body Dressing/Undressing Upper body dressing/undressing steps patient completed: Thread/unthread right sleeve of pullover shirt/dresss;Thread/unthread left sleeve of pullover shirt/dress Upper body dressing/undressing: 3: Mod-Patient completed 50-74% of tasks FIM - Lower Body Dressing/Undressing Lower body dressing/undressing steps patient completed: Pull underwear up/down;Thread/unthread left underwear leg;Thread/unthread left pants leg Lower body dressing/undressing: 2: Max-Patient completed 25-49% of tasks  FIM - Toileting Toileting: 1: Total-Patient completed zero steps, helper did all 3     FIM - Bed/Chair Transfer Bed/Chair Transfer: 4: Bed > Chair or W/C: Min A (steadying Pt. > 75%);4: Chair or W/C > Bed: Min A (steadying Pt. > 75%)  FIM - Locomotion: Wheelchair Distance: 45' Locomotion: Wheelchair: 1: Travels less than 50 ft with moderate assistance (Pt: 50 - 74%) FIM - Locomotion: Ambulation Locomotion: Ambulation Assistive Devices: Designer, industrial/product Ambulation/Gait Assistance: 2: Max assist Locomotion: Ambulation: 2: Travels 50 - 149 ft with moderate assistance (Pt: 50 - 74%)  Comprehension Comprehension Mode: Auditory Comprehension: 3-Understands basic 50 - 74% of the time/requires cueing 25 - 50%  of the time  Expression Expression Mode: Verbal Expression: 2-Expresses basic 25 - 49% of the time/requires cueing 50 - 75% of the time. Uses single  words/gestures.  Social Interaction Social Interaction: 3-Interacts appropriately 50 - 74% of the time - May be physically or verbally inappropriate.  Problem Solving Problem Solving: 2-Solves basic 25 - 49% of the time - needs direction more than half the time to initiate, plan or complete simple activities  Memory Memory: 3-Recognizes or recalls 50 - 74% of the time/requires cueing 25 - 49% of the time  Medical Problem List and Plan:  1. DVT Prophylaxis/Anticoagulation: Mechanical: Sequential compression devices, below knee Bilateral lower extremities  2. Pain Management: denies any pain.  3. Mood: no signs of distress. Patient with significant cognitive deficits with poor awareness and impaired insight.  4. Neuropsych: This patient is not capable of making decisions on his/her own behalf.  5. ABLA:improved 12.1 6. RUE DVT: no blood thinner for now per NS.  7. Polysubstance abuse: agitation resolved. Denies any problems with alcohol/drug use. Will continue to monitor, but overall behavior is quite pleasant at present.  8. HTN: off BP meds at current time. Monitor with bid checks for trends.  9. Urine retention/neurogenic bladder: dc foley, begin training, urine negative so far    LOS (Days) 3 A FACE TO FACE EVALUATION WAS PERFORMED  Yanira Tolsma T 05/10/2012, 7:56 AM

## 2012-05-11 ENCOUNTER — Inpatient Hospital Stay (HOSPITAL_COMMUNITY): Payer: 59 | Admitting: Physical Therapy

## 2012-05-11 ENCOUNTER — Inpatient Hospital Stay (HOSPITAL_COMMUNITY): Payer: 59

## 2012-05-11 ENCOUNTER — Inpatient Hospital Stay (HOSPITAL_COMMUNITY): Payer: 59 | Admitting: Speech Pathology

## 2012-05-11 NOTE — Progress Notes (Signed)
Patient last seen sitting in the lounge chair with a safety belt on at 1500. Ensure offered and taken. Patient comfortable, toileted prior. At approximately 1545, Morrie Sheldon, rn saw patient standing at the foot of the bed and slowing falling on his knees and elbow. Patient said he was trying to get his order for dinner. Brought back to bed via hoyer lift. Denies pain, no redness nor bruising seen. [name] notfied. Spoke to Ms. Stephani Police ( contact person)  regarding incident. No distress at this time.will monitor.

## 2012-05-11 NOTE — Progress Notes (Signed)
Subjective/Complaints: No problems overnight. Good day yesterday A 12 point review of systems has been performed and if not noted above is otherwise negative.   Objective: Vital Signs: Blood pressure 139/87, pulse 86, temperature 99.5 F (37.5 C), temperature source Oral, resp. rate 19, weight 79.8 kg (175 lb 14.8 oz), SpO2 96.00%. No results found. No results found for this basename: WBC:2,HGB:2,HCT:2,PLT:2 in the last 72 hours No results found for this basename: NA:2,K:2,CL:2,CO2:2,GLUCOSE:2,BUN:2,CREATININE:2,CALCIUM:2 in the last 72 hours CBG (last 3)  No results found for this basename: GLUCAP:3 in the last 72 hours  Wt Readings from Last 3 Encounters:  05/08/12 79.8 kg (175 lb 14.8 oz)  05/04/12 83.9 kg (184 lb 15.5 oz)  05/04/12 83.9 kg (184 lb 15.5 oz)    Physical Exam:  Constitutional: He appears well-developed and well-nourished.  HENT:  Head: Normocephalic.  Left crani incision clean and dry with staples in place. Dry crusted blood along incision line.  Eyes: Pupils are equal, round, and reactive to light.  Neck: Normal range of motion.  Cardiovascular: Normal rate and regular rhythm.  Pulmonary/Chest: Effort normal and breath sounds normal.  Abdominal: Soft. Bowel sounds are normal.  Musculoskeletal: He exhibits no edema.  Neurological: He is alert.  Oriented to self and place today. A. Poor insight.  Right sided weakness UE>LE--?motor apraxia RUE is grossly 3+ to4/5. RLE is  3/5 prox to 3+/5 distally. Withdraws to pain on the right side. Right facial weakness with tongue deviaition. Speech clear but slow rate. Distracted. Able to follow simple one and two step commands.  Skin: Skin is warm and dry.  Psychiatric:    calm, collected    Assessment/Plan: 1. 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 Physiatrist is providing close team  supervision and 24 hour management of active medical problems listed below. Physiatrist and rehab team continue to assess barriers to discharge/monitor patient progress toward functional and medical goals. FIM: FIM - Bathing Bathing Steps Patient Completed: Chest;Right Arm;Left Arm;Abdomen;Front perineal area;Right upper leg;Left upper leg;Buttocks Bathing: 4: Min-Patient completes 8-9 23f 10 parts or 75+ percent  FIM - Upper Body Dressing/Undressing Upper body dressing/undressing steps patient completed: Thread/unthread right sleeve of pullover shirt/dresss;Thread/unthread left sleeve of pullover shirt/dress;Put head through opening of pull over shirt/dress;Pull shirt over trunk Upper body dressing/undressing: 5: Supervision: Safety issues/verbal cues FIM - Lower Body Dressing/Undressing Lower body dressing/undressing steps patient completed: Thread/unthread left pants leg;Don/Doff left shoe;Don/Doff right shoe Lower body dressing/undressing: 2: Max-Patient completed 25-49% of tasks  FIM - Toileting Toileting: 1: Total-Patient completed zero steps, helper did all 3  FIM - Toilet Transfers Toilet Transfers: 4-From toilet/BSC: Min A (steadying Pt. > 75%);4-To toilet/BSC: Min A (steadying Pt. > 75%)  FIM - Bed/Chair Transfer Bed/Chair Transfer: 3: Chair or W/C > Bed: Mod A (lift or lower assist);3: Bed > Chair or W/C: Mod A (lift or lower assist)  FIM - Locomotion: Wheelchair Distance: 45' Locomotion: Wheelchair: 2: Travels 50 - 149 ft with moderate assistance (Pt: 50 - 74%) FIM - Locomotion: Ambulation Locomotion: Ambulation Assistive Devices: Designer, industrial/product Ambulation/Gait Assistance: 4: Min assist Locomotion: Ambulation: 2: Travels 50 - 149 ft with moderate assistance (Pt: 50 - 74%)  Comprehension Comprehension Mode: Auditory Comprehension: 3-Understands basic 50 - 74% of the time/requires cueing 25 - 50%  of the time  Expression Expression Mode: Verbal Expression: 3-Expresses  basic 50 - 74% of the time/requires cueing  25 - 50% of the time. Needs to repeat parts of sentences.  Social Interaction Social Interaction: 3-Interacts appropriately 50 - 74% of the time - May be physically or verbally inappropriate.  Problem Solving Problem Solving: 2-Solves basic 25 - 49% of the time - needs direction more than half the time to initiate, plan or complete simple activities  Memory Memory: 3-Recognizes or recalls 50 - 74% of the time/requires cueing 25 - 49% of the time  Medical Problem List and Plan:  1. DVT Prophylaxis/Anticoagulation: Mechanical: Sequential compression devices, below knee Bilateral lower extremities  2. Pain Management: denies any pain.  3. Mood: no signs of distress. Patient with significant cognitive deficits with poor awareness and impaired insight.  4. Neuropsych: This patient is not capable of making decisions on his/her own behalf.  5. ABLA:improved 12.1 6. RUE DVT: no blood thinner for now per NS.  7. Polysubstance abuse: agitation resolved. Denies any problems with alcohol/drug use. Will continue to monitor, but overall behavior is quite pleasant at present.  8. HTN: off BP meds at current time. Monitor with bid checks for trends.  9. Urine retention/neurogenic bladder: urine cx negative , he has been continent    LOS (Days) 4 A FACE TO FACE EVALUATION WAS PERFORMED  SWARTZ,ZACHARY T 05/11/2012, 6:19 AM

## 2012-05-11 NOTE — Progress Notes (Signed)
The skilled treatment note has been reviewed and SLP is in agreement. Reshunda Strider, M.A., CCC-SLP [phone]  

## 2012-05-11 NOTE — Progress Notes (Signed)
Speech Language Pathology Daily Session Note  Patient Details  Name: Wayne Buckley MRN: [id number] Date of Birth: May 22, 1951  Today's Date: 05/11/2012 Time: 0800-0845 Time Calculation (min): 45 min  Short Term Goals: Week 1: SLP Short Term Goal 1 (Week 1): Pt will consume trials of Dys. 3 textures with efficient mastication with Mod verbal cues.  SLP Short Term Goal 2 (Week 1): Pt will utilize swallowing compensatory strategies while consuming current diet with Mod verbal and visual cues.  SLP Short Term Goal 3 (Week 1): Pt will identify common objects with Min phonemic cues.  SLP Short Term Goal 4 (Week 1): Pt will identify 1 physical and 1 cognitive deficit with Max multimodal cueing SLP Short Term Goal 5 (Week 1): Pt will demonstrate selective attention to a functional task for 15 minutes with Max verbal cues for redirection  SLP Short Term Goal 6 (Week 1): Pt will utilize call bell to express wants/needs with Mod multimodal cueing.   Skilled Therapeutic Interventions: Treatment addressed dysphagia management and cognitive-linguistic goals.  Patient consumed Dys. 2 textures and thin liquids with min A verbal cues for utilization of small bites and to check for left sided pocketing with cough x1 on textures which SLP suspects was caused by pharyngeal residue from increased bolus size.  Strong reflexive cough followed by swallow appeared to clear suspected penetrates.  Patient selectively attended to a basic familiar task for approximately 15 minutes with min A verbal cues for redirection.  SLP also facilitated session with max assist multimodal cuing during a generative naming task.  Patient named items on AM meal tray with min A question cues.      FIM:  Comprehension Comprehension Mode: Auditory Comprehension: 3-Understands basic 50 - 74% of the time/requires cueing 25 - 50%  of the time Expression Expression Mode: Verbal Expression: 3-Expresses basic 50 - 74% of the time/requires cueing  25 - 50% of the time. Needs to repeat parts of sentences. Social Interaction Social Interaction: 3-Interacts appropriately 50 - 74% of the time - May be physically or verbally inappropriate. Problem Solving Problem Solving: 2-Solves basic 25 - 49% of the time - needs direction more than half the time to initiate, plan or complete simple activities Memory Memory: 3-Recognizes or recalls 50 - 74% of the time/requires cueing 25 - 49% of the time FIM - Eating Eating Activity: 5: Supervision/cues  Pain Pain Assessment Pain Assessment: No/denies pain  Therapy/Group: Individual Therapy  Jackalyn Lombard, [place]  Graduate Clinician Speech Language Pathology   Ausha Sieh, Joni Reining 05/11/2012, 9:58 AM

## 2012-05-11 NOTE — Progress Notes (Signed)
Physical Therapy Session Note  Patient Details  Name: Wayne Buckley MRN: [id number] Date of Birth: [birth year]  Today's Date: 05/11/2012 Time: 4098-1191 Time Calculation (min): 45 min  Short Term Goals: Week 1:  PT Short Term Goal 1 (Week 1): Pt will perform functional transfers min A PT Short Term Goal 2 (Week 1): Pt will demo intellectual awareness with min A PT Short Term Goal 3 (Week 1): Pt will gait with min A 50' in controlled environment  Therapy Documentation Precautions:  Precautions Precautions: Fall Precaution Comments: DVT in right UE- restricted  Restrictions Weight Bearing Restrictions: No  Pain: Pain Assessment: No/denies pain  Therapeutic Activity:(15') Bed Mobility with S/Mod-I, Transfers sit<->stand with S/min-A, bed<->w/c with min-A.  Patient needing many verbal cues for sequencing and safety. Therapeutic Exercise:(15') Nu-Step x 10', B LE's in sitting Gait Training:(15') RW 2 x 200' with min-A and verbal cues for safety  Therapy/Group: Individual Therapy  Rex Kras 05/11/2012, 10:38 AM

## 2012-05-11 NOTE — Progress Notes (Signed)
Occupational Therapy Session Note  Patient Details  Name: Wayne Buckley MRN: [id number] Date of Birth: 1951/06/14  Today's Date: 05/11/2012 Time: 1116-1201 Time Calculation (min): 45 min  Short Term Goals: Week 1:  OT Short Term Goal 1 (Week 1): Pt will don shirt with supervision OT Short Term Goal 2 (Week 1): Pt will maintain selective attention with min A during funcitonal tasks OT Short Term Goal 3 (Week 1): Pt will perform toileting with mod A sit to stand OT Short Term Goal 4 (Week 1): Pt will demonstrate safety awareness by using the call bell for needs 25% of day OT Short Term Goal 5 (Week 1): Pt will demonstrate intellecual awareness with mod A during functional ADL tasks  Skilled Therapeutic Interventions: ADL-retraining with emphasis on sequencing, dynamic sitting/standing balance, safety awareness, and selective attention.   Patient able to ambulate with contact guard for safety and was able to transfer with steadying assist and verbal cues for sequencing and positioning.   Patient demonstrated right LE weakness throughout session, often dragging right leg but he responded with cues to lift right leg and bend his knee.  Patient reported that right leg weakness was "new" impairment during this session.  Safety awareness remains poor due to impulsivity.      Therapy Documentation Precautions:  Precautions Precautions: Fall Precaution Comments: DVT in right UE- restricted  Restrictions Weight Bearing Restrictions: No  Pain: Pain Assessment Pain Assessment: No/denies pain  See FIM for current functional status  Therapy/Group: Individual Therapy  Second session: Time: 1430-1500 Time Calculation (min):  30 min  Pain Assessment: No report of pain  Skilled Therapeutic Interventions: ADL-retraining with emphasis on toilet transfers, toileting (hygiene and clothing management), and functional transfers.   Patient completed bed mobility using DME with supervision in prep for  functional mobility and endurance training but requested to use toilet once he stood upgright.  After close assisted mobility and transfer to toilet patient required extended period to complete BM, with only supervision assist required.   OT noted soiled clothing and then assisted patient with lower body cleansing and dressing (Min assist).  See FIM for current functional status  Therapy/Group: Individual Therapy   Georgeanne Nim 05/11/2012, 12:57 PM

## 2012-05-12 ENCOUNTER — Inpatient Hospital Stay (HOSPITAL_COMMUNITY): Payer: 59

## 2012-05-12 NOTE — Progress Notes (Signed)
Physical Therapy Session Note  Patient Details  Name: Wayne Buckley MRN: [id number] Date of Birth: 07-Jul-1950  Today's Date: 05/12/2012 Time: 4098-1191 Time Calculation (min): 45 min  Short Term Goals: Week 1:  PT Short Term Goal 1 (Week 1): Pt will perform functional transfers min A PT Short Term Goal 2 (Week 1): Pt will demo intellectual awareness with min A PT Short Term Goal 3 (Week 1): Pt will gait with min A 50' in controlled environment  Skilled Therapeutic Interventions/Progress Updates:  Patient transferred to toilet with minimal assistance and use of grab bar. Patient needed assistance with pants up and down. Patient to sink to brush teeth and wash hands. Patient performed independently from seated position. Patient ambulated with rolling walker 70 feet with moderate assistance for balance. Patient practiced sit to stand and stand to sit 5 times each working on hand placement. Patient requires cueing about 80% of the time to push up from the chair and to reach back prior to sitting. Patient worked on [employer] shifting and balance using "catch game" on [employer] on easy mode x 10 minutes. Patient demonstrated most difficulty shifting weight forward on toes. Patient stands with both hips externally rotated and toes pointing outward. Patient to Nustep x 5 minutes to work on strengthening of LE's.   Therapy Documentation Precautions:  Precautions Precautions: Fall Precaution Comments: DVT in right UE- restricted  Restrictions Weight Bearing Restrictions: No  Pain: Pain Assessment Pain Assessment: No/denies pain  Locomotion : Ambulation Ambulation/Gait Assistance: 3: Mod assist   See FIM for current functional status  Therapy/Group: Individual Therapy  Alma Friendly 05/12/2012, 12:06 PM

## 2012-05-12 NOTE — Progress Notes (Signed)
Subjective/Complaints: Pt stood unassisted last night and had slow fall to floor. No significant sequelae. Denies pain. A 12 point review of systems has been performed and if not noted above is otherwise negative.   Objective: Vital Signs: Blood pressure 130/80, pulse 100, temperature 98.2 F (36.8 C), temperature source Oral, resp. rate 18, weight 79.8 kg (175 lb 14.8 oz), SpO2 98.00%. No results found. No results found for this basename: WBC:2,HGB:2,HCT:2,PLT:2 in the last 72 hours No results found for this basename: NA:2,K:2,CL:2,CO2:2,GLUCOSE:2,BUN:2,CREATININE:2,CALCIUM:2 in the last 72 hours CBG (last 3)  No results found for this basename: GLUCAP:3 in the last 72 hours  Wt Readings from Last 3 Encounters:  05/08/12 79.8 kg (175 lb 14.8 oz)  05/04/12 83.9 kg (184 lb 15.5 oz)  05/04/12 83.9 kg (184 lb 15.5 oz)    Physical Exam:  Constitutional: He appears well-developed and well-nourished.  HENT:  Head: Normocephalic.  Left crani incision clean and dry with staples in place.    Eyes: Pupils are equal, round, and reactive to light.  Neck: Normal range of motion.  Cardiovascular: Normal rate and regular rhythm.  Pulmonary/Chest: Effort normal and breath sounds normal.  Abdominal: Soft. Bowel sounds are normal.  Musculoskeletal: He exhibits no edema.  Neurological: He is alert.  Oriented to self and place today. A. Poor insight.  Right sided weakness UE>LE--?motor apraxia RUE is grossly 3+ to4/5. RLE is  3/5 prox to 3+/5 distally. Withdraws to pain on the right side. Right facial weakness with tongue deviaition. Speech clear but slow rate. Distracted. Able to follow simple one and two step commands.  Skin: Skin is warm and dry.  Psychiatric:    calm, collected    Assessment/Plan: 1. 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 Physiatrist is  providing close team supervision and 24 hour management of active medical problems listed below. Physiatrist and rehab team continue to assess barriers to discharge/monitor patient progress toward functional and medical goals.   SR x 4 for safety   FIM: FIM - Bathing Bathing Steps Patient Completed: Chest;Right Arm;Left Arm;Abdomen;Front perineal area;Buttocks;Right upper leg;Left upper leg Bathing: 4: Min-Patient completes 8-9 40f 10 parts or 75+ percent  FIM - Upper Body Dressing/Undressing Upper body dressing/undressing steps patient completed: Thread/unthread right sleeve of pullover shirt/dresss;Thread/unthread left sleeve of pullover shirt/dress;Put head through opening of pull over shirt/dress;Pull shirt over trunk Upper body dressing/undressing: 5: Supervision: Safety issues/verbal cues FIM - Lower Body Dressing/Undressing Lower body dressing/undressing steps patient completed: Thread/unthread left pants leg;Pull pants up/down;Don/Doff left sock;Fasten/unfasten left shoe Lower body dressing/undressing: 3: Mod-Patient completed 50-74% of tasks  FIM - Toileting Toileting: 1: Total-Patient completed zero steps, helper did all 3  FIM - Toilet Transfers Toilet Transfers: 4-From toilet/BSC: Min A (steadying Pt. > 75%);4-To toilet/BSC: Min A (steadying Pt. > 75%)  FIM - Banker Devices: Walker;Bed rails Bed/Chair Transfer: 4: Supine > Sit: Min A (steadying Pt. > 75%/lift 1 leg);4: Sit > Supine: Min A (steadying pt. > 75%/lift 1 leg);4: Bed > Chair or W/C: Min A (steadying Pt. > 75%)  FIM - Locomotion: Wheelchair Distance: 45' Locomotion: Wheelchair: 2: Travels 50 - 149 ft with moderate assistance (Pt: 50 - 74%) FIM - Locomotion: Ambulation Locomotion: Ambulation Assistive Devices: Designer, industrial/product Ambulation/Gait Assistance: 4: Min assist Locomotion: Ambulation: 2: Travels 50 - 149 ft with moderate assistance (Pt: 50 -  74%)  Comprehension Comprehension  Mode: Auditory Comprehension: 3-Understands basic 50 - 74% of the time/requires cueing 25 - 50%  of the time  Expression Expression Mode: Verbal Expression: 4-Expresses basic 75 - 89% of the time/requires cueing 10 - 24% of the time. Needs helper to occlude trach/needs to repeat words.  Social Interaction Social Interaction: 4-Interacts appropriately 75 - 89% of the time - Needs redirection for appropriate language or to initiate interaction.  Problem Solving Problem Solving: 3-Solves basic 50 - 74% of the time/requires cueing 25 - 49% of the time  Memory Memory: 3-Recognizes or recalls 50 - 74% of the time/requires cueing 25 - 49% of the time  Medical Problem List and Plan:  1. DVT Prophylaxis/Anticoagulation: Mechanical: Sequential compression devices, below knee Bilateral lower extremities  2. Pain Management: denies any pain.  3. Mood: no signs of distress. Patient with significant cognitive deficits with poor awareness and impaired insight.  4. Neuropsych: This patient is not capable of making decisions on his/her own behalf.  5. ABLA:improved 12.1 6. RUE DVT: no blood thinner for now per NS.  7. Polysubstance abuse: agitation resolved. Denies any problems with alcohol/drug use. Will continue to monitor, but overall behavior is quite pleasant at present.  8. HTN: off BP meds at current time. Monitor with bid checks for trends.  9. Urine retention/neurogenic bladder: urine cx negative , he has been continent    LOS (Days) 5 A FACE TO FACE EVALUATION WAS PERFORMED  Khamil Lamica T 05/12/2012, 7:30 AM

## 2012-05-13 ENCOUNTER — Inpatient Hospital Stay (HOSPITAL_COMMUNITY): Payer: 59 | Admitting: Physical Therapy

## 2012-05-13 ENCOUNTER — Encounter (HOSPITAL_COMMUNITY): Payer: 59

## 2012-05-13 ENCOUNTER — Inpatient Hospital Stay (HOSPITAL_COMMUNITY): Payer: 59 | Admitting: Occupational Therapy

## 2012-05-13 ENCOUNTER — Inpatient Hospital Stay (HOSPITAL_COMMUNITY): Payer: 59

## 2012-05-13 ENCOUNTER — Inpatient Hospital Stay (HOSPITAL_COMMUNITY): Payer: 59 | Admitting: Speech Pathology

## 2012-05-13 DIAGNOSIS — W19XXXA Unspecified fall, initial encounter: Secondary | ICD-10-CM

## 2012-05-13 DIAGNOSIS — S069X9A Unspecified intracranial injury with loss of consciousness of unspecified duration, initial encounter: Secondary | ICD-10-CM

## 2012-05-13 DIAGNOSIS — F1010 Alcohol abuse, uncomplicated: Secondary | ICD-10-CM

## 2012-05-13 DIAGNOSIS — I6200 Nontraumatic subdural hemorrhage, unspecified: Secondary | ICD-10-CM

## 2012-05-13 NOTE — Progress Notes (Signed)
Occupational Therapy Session Note  Patient Details  Name: Wayne Buckley MRN: [id number] Date of Birth: 10-12-1950  Today's Date: 05/13/2012 Time: 1345-1445 Time Calculation (min): 60 min  Short Term Goals: Week 1:  OT Short Term Goal 1 (Week 1): Pt will don shirt with supervision OT Short Term Goal 2 (Week 1): Pt will maintain selective attention with min A during funcitonal tasks OT Short Term Goal 3 (Week 1): Pt will perform toileting with mod A sit to stand OT Short Term Goal 4 (Week 1): Pt will demonstrate safety awareness by using the call bell for needs 25% of day OT Short Term Goal 5 (Week 1): Pt will demonstrate intellecual awareness with mod A during functional ADL tasks  Skilled Therapeutic Interventions/Progress Updates:    1:1 focus on intellectual awareness, safety awareness right body attention, sequencing, task organization, sit to stand, standing balance, side stepping, right hand coordination and dominant hand use making ramen noodles in the kitchen; toileting and toilet transfers  Therapy Documentation Precautions:  Precautions Precautions: Fall Precaution Comments: DVT in right UE- restricted  Restrictions Weight Bearing Restrictions: No Pain: Pain Assessment Pain Assessment: No/denies pain  See FIM for current functional status  Therapy/Group: Individual Therapy  Roney Mans [facility] 05/13/2012, 2:51 PM

## 2012-05-13 NOTE — Progress Notes (Signed)
Occupational Therapy Session Note  Patient Details  Name: Wayne Buckley MRN: [id number] Date of Birth: 1950/08/17  Today's Date: 05/13/2012 Time: 1000-1055 Time Calculation (min): 55 min  Short Term Goals: Week 1:  OT Short Term Goal 1 (Week 1): Pt will don shirt with supervision OT Short Term Goal 2 (Week 1): Pt will maintain selective attention with min A during funcitonal tasks OT Short Term Goal 3 (Week 1): Pt will perform toileting with mod A sit to stand OT Short Term Goal 4 (Week 1): Pt will demonstrate safety awareness by using the call bell for needs 25% of day OT Short Term Goal 5 (Week 1): Pt will demonstrate intellecual awareness with mod A during functional ADL tasks  Skilled Therapeutic Interventions/Progress Updates:    Pt engaged in bathing tasks at shower level and dressing tasks with sit<>stand from w/c at sink.  Pt performed shaving and grooming tasks seated at sink.  Pt required steady assist with standing in shower to bathe buttocks land at sink to pull up pants.  Pt completed all other tasks at supervision level.  Pt initiated all tasks throughout session.  Pt required min A for shower transfer using grab bars.  Focus on task initiation, standing balance, transfers, safety awareness, and activity tolerance.  Therapy Documentation Precautions:  Precautions Precautions: Fall Precaution Comments: DVT in right UE- restricted  Restrictions Weight Bearing Restrictions: No   Pain: Pain Assessment Pain Assessment: No/denies pain  See FIM for current functional status  Therapy/Group: Individual Therapy  Rich Brave 05/13/2012, 11:03 AM

## 2012-05-13 NOTE — Progress Notes (Signed)
Physical Therapy Note  Patient Details  Name: Wayne Buckley MRN: [id number] Date of Birth: 1950-07-21 Today's Date: 05/13/2012  Time: 800-856 56 minutes  No c/o pain.  W/c mobility with B UEs improved coordination, able to perform straight propulsion and turning with supervision.  Gait training with RW, multiple attempts, pt with improved balance and ability to stay close to RW with decreased cues.  Pt continues to forward flexed posture, unable to look straight ahead while walking.  Standing NMR with squatting/reaching task with min-mod A for balance.  Sideways/backward stepping in squatted position with mod-max A for dynamic balance.  Pt with improved activity tolerance and coordination with gait and w/c mobility today.  Individual therapy   Frimet Durfee 05/13/2012, 8:57 AM

## 2012-05-13 NOTE — Progress Notes (Signed)
Subjective/Complaints: No problems overnight A 12 point review of systems has been performed and if not noted above is otherwise negative.   Objective: Vital Signs: Blood pressure 145/91, pulse 59, temperature 98.1 F (36.7 C), temperature source Oral, resp. rate 18, weight 79.8 kg (175 lb 14.8 oz), SpO2 96.00%. No results found. No results found for this basename: WBC:2,HGB:2,HCT:2,PLT:2 in the last 72 hours No results found for this basename: NA:2,K:2,CL:2,CO2:2,GLUCOSE:2,BUN:2,CREATININE:2,CALCIUM:2 in the last 72 hours CBG (last 3)  No results found for this basename: GLUCAP:3 in the last 72 hours  Wt Readings from Last 3 Encounters:  05/08/12 79.8 kg (175 lb 14.8 oz)  05/04/12 83.9 kg (184 lb 15.5 oz)  05/04/12 83.9 kg (184 lb 15.5 oz)    Physical Exam:  Constitutional: He appears well-developed and well-nourished.  HENT:  Head: Normocephalic.  Left crani incision clean and dry with staples in place.    Eyes: Pupils are equal, round, and reactive to light.  Neck: Normal range of motion.  Cardiovascular: Normal rate and regular rhythm.  Pulmonary/Chest: Effort normal and breath sounds normal.  Abdominal: Soft. Bowel sounds are normal.  Musculoskeletal: He exhibits no edema.  Neurological: He is alert.  Oriented to self and place today. A. Poor insight.  Right sided weakness UE>LE--?motor apraxia RUE is grossly 3+ to4/5. RLE is  3+/5 prox to 4/5 distally. Pain sense better on right. Speech more automatic. Able to carry simple conversation.  Psychiatric:    calm, collected    Assessment/Plan: 1. 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 Physiatrist is providing close team supervision and 24 hour management of active medical problems listed below. Physiatrist and rehab team continue to assess barriers to discharge/monitor patient progress  toward functional and medical goals.   SR x 4 for safety   FIM: FIM - Bathing Bathing Steps Patient Completed:  (refused. waiting for wife to do it) Bathing: 4: Min-Patient completes 8-9 60f 10 parts or 75+ percent  FIM - Upper Body Dressing/Undressing Upper body dressing/undressing steps patient completed: Thread/unthread right sleeve of pullover shirt/dresss;Thread/unthread left sleeve of pullover shirt/dress;Put head through opening of pull over shirt/dress;Pull shirt over trunk Upper body dressing/undressing: 5: Supervision: Safety issues/verbal cues FIM - Lower Body Dressing/Undressing Lower body dressing/undressing steps patient completed: Thread/unthread left pants leg;Pull pants up/down;Don/Doff left sock;Fasten/unfasten left shoe Lower body dressing/undressing: 3: Mod-Patient completed 50-74% of tasks  FIM - Toileting Toileting steps completed by patient: Performs perineal hygiene Toileting: 1: Total-Patient completed zero steps, helper did all 3  FIM - Diplomatic Services operational officer Devices: Grab bars Toilet Transfers: 4-To toilet/BSC: Min A (steadying Pt. > 75%)  FIM - Bed/Chair Transfer Bed/Chair Transfer Assistive Devices: Bed rails Bed/Chair Transfer: 4: Chair or W/C > Bed: Min A (steadying Pt. > 75%)  FIM - Locomotion: Wheelchair Distance: 45' Locomotion: Wheelchair: 2: Travels 50 - 149 ft with moderate assistance (Pt: 50 - 74%) FIM - Locomotion: Ambulation Locomotion: Ambulation Assistive Devices: Designer, industrial/product Ambulation/Gait Assistance: 3: Mod assist Locomotion: Ambulation: 2: Travels 50 - 149 ft with moderate assistance (Pt: 50 - 74%)  Comprehension Comprehension Mode: Auditory Comprehension: 3-Understands basic 50 - 74% of the time/requires cueing 25 - 50%  of the time  Expression Expression Mode: Verbal Expression: 4-Expresses basic 75 - 89% of the time/requires cueing 10 - 24% of the time. Needs helper to occlude trach/needs to repeat  words.  Social Interaction  Social Interaction: 4-Interacts appropriately 75 - 89% of the time - Needs redirection for appropriate language or to initiate interaction.  Problem Solving Problem Solving: 3-Solves basic 50 - 74% of the time/requires cueing 25 - 49% of the time  Memory Memory: 3-Recognizes or recalls 50 - 74% of the time/requires cueing 25 - 49% of the time  Medical Problem List and Plan:  1. DVT Prophylaxis/Anticoagulation: Mechanical: Sequential compression devices, below knee Bilateral lower extremities  2. Pain Management: denies any pain.  3. Mood: no signs of distress. Patient with significant cognitive deficits with poor awareness and impaired insight.  4. Neuropsych: This patient is not capable of making decisions on his/her own behalf.  5. ABLA:improved 12.1 6. RUE DVT: no blood thinner for now per NS.  7. Polysubstance abuse: outpt follow up 8. HTN: off BP meds at current time. Monitor with bid checks for trends.  9. Urine retention/neurogenic bladder: urine cx negative , he has been continent    LOS (Days) 6 A FACE TO FACE EVALUATION WAS PERFORMED  Candie Gintz T 05/13/2012, 7:02 AM

## 2012-05-13 NOTE — Progress Notes (Signed)
Physical Therapy Note  Patient Details  Name: TRAVES STOGSDILL MRN: [id number] Date of Birth: 06-20-1950 Today's Date: 05/13/2012  Time: 1300-1330 30 minutes  No c/o pain.  Treatment focused on NMR for trunk control and stability.  Supine bridges with adductor squeeze, adductor squeeze with LE lifts with min A.  R LE more decreased coordination than L LE.  Quadruped with alt UE lifts, pt able to maintain with improvement vs last attempt.  Tall kneeling with tactile cues for trunk extension, alt UE lifts with min A, pt fatigues easily, only able to maintain tall kneeling without UE support 30-45 seconds before fatigue.  Pt with overall better trunk control, continues to be limited by impaired coordination and weakness.  Individual therapy   DONAWERTH,KAREN 05/13/2012, 3:55 PM

## 2012-05-13 NOTE — Progress Notes (Signed)
Speech Language Pathology Daily Session Note  Patient Details  Name: Wayne Buckley MRN: [id number] Date of Birth: 04/01/1951  Today's Date: 05/13/2012 Time: 1000-1055 Time Calculation (min): 55 min  Short Term Goals: Week 1: SLP Short Term Goal 1 (Week 1): Pt will consume trials of Dys. 3 textures with efficient mastication with Mod verbal cues.  SLP Short Term Goal 2 (Week 1): Pt will utilize swallowing compensatory strategies while consuming current diet with Mod verbal and visual cues.  SLP Short Term Goal 3 (Week 1): Pt will identify common objects with Min phonemic cues.  SLP Short Term Goal 4 (Week 1): Pt will identify 1 physical and 1 cognitive deficit with Max multimodal cueing SLP Short Term Goal 5 (Week 1): Pt will demonstrate selective attention to a functional task for 15 minutes with Max verbal cues for redirection  SLP Short Term Goal 6 (Week 1): Pt will utilize call bell to express wants/needs with Mod multimodal cueing.   Skilled Therapeutic Interventions: Treatment focus on dysphagia and cognitive goals. Pt consumed current diet of Dys. 2 textures with thin liquids without overt s/s of aspiration and required overall supervision cues to utilize swallowing compensatory strategies. Pt also demonstrated increased attention to task and did not require cues to attend to meal for ~20 minutes in a mildly distracting environment. Pt sequenced 4 step picture cards with Min verbal and question cues and described the pictures with Min A semantic cues to increase word finding at the sentence level. Pt required Min phonemic and semantic cues for orientation to place and situation.    FIM:  Comprehension Comprehension Mode: Auditory Comprehension: 4-Understands basic 75 - 89% of the time/requires cueing 10 - 24% of the time Expression Expression Mode: Verbal Expression: 4-Expresses basic 75 - 89% of the time/requires cueing 10 - 24% of the time. Needs helper to occlude trach/needs to  repeat words. Social Interaction Social Interaction: 4-Interacts appropriately 75 - 89% of the time - Needs redirection for appropriate language or to initiate interaction. Problem Solving Problem Solving: 3-Solves basic 50 - 74% of the time/requires cueing 25 - 49% of the time Memory Memory: 3-Recognizes or recalls 50 - 74% of the time/requires cueing 25 - 49% of the time FIM - Eating Eating Activity: 5: Needs verbal cues/supervision  Pain Pain Assessment Pain Assessment: No/denies pain  Therapy/Group: Individual Therapy  Aki Abalos 05/13/2012, 10:57 AM

## 2012-05-14 ENCOUNTER — Inpatient Hospital Stay (HOSPITAL_COMMUNITY): Payer: 59 | Admitting: Physical Therapy

## 2012-05-14 ENCOUNTER — Inpatient Hospital Stay (HOSPITAL_COMMUNITY): Payer: 59

## 2012-05-14 ENCOUNTER — Encounter (HOSPITAL_COMMUNITY): Payer: 59 | Admitting: Occupational Therapy

## 2012-05-14 ENCOUNTER — Inpatient Hospital Stay (HOSPITAL_COMMUNITY): Payer: 59 | Admitting: Occupational Therapy

## 2012-05-14 ENCOUNTER — Inpatient Hospital Stay (HOSPITAL_COMMUNITY): Payer: 59 | Admitting: Speech Pathology

## 2012-05-14 NOTE — Progress Notes (Signed)
Physical Therapy Session Note  Patient Details  Name: Wayne Buckley MRN: [id number] Date of Birth: 09-12-1950  Today's Date: 05/14/2012 Time: 1115-1200 Time Calculation (min): 45 min  Short Term Goals: Week 1:  PT Short Term Goal 1 (Week 1): Pt will perform functional transfers min A PT Short Term Goal 1 - Progress (Week 1): Met PT Short Term Goal 2 (Week 1): Pt will demo intellectual awareness with min A PT Short Term Goal 2 - Progress (Week 1): Met PT Short Term Goal 3 (Week 1): Pt will gait with min A 50' in controlled environment PT Short Term Goal 3 - Progress (Week 1): Met  Skilled Therapeutic Interventions/Progress Updates:  Patient performed WC propulsion for improved endurance >150' with min A x2; patient performed sit/stand transfers to/from WC/mat table using RW with min A due to cuing being required to get closer to surface before beginning to sit.  On mat table worked with patient on increasing hip/trunk strength and stability through quadruped activities: alternate reaching of UEs, lifting head, pelvic tilts (with assist).  Tall kneeling activities with hip extension for 2 minute bouts x2.  In supine knee squeeze of volley ball 10x2; bridging with ball between knees x10 to improve hip strengthening.  Patient exercised on NuStep at level 4 for two 4-minute bouts with 3 minute rest break using UE/LE for improved endurance.  Gait training on tiled flooring with RW and min A with cuing for improved proximity in walker for 50' x2.  Patient returned to nursing station at conclusion of session in [facility] with safety belt in place.     Therapy Documentation Precautions:  Precautions Precautions: Fall Precaution Comments: DVT in right UE- restricted  Restrictions Weight Bearing Restrictions: No Pain: Pain Assessment Pain Assessment: No/denies pain Mobility:   Locomotion : Ambulation Ambulation/Gait Assistance: 3: Mod assist  See FIM for current functional status  Therapy/Group:  Individual Therapy  Rexene Agent 05/14/2012, 12:46 PM

## 2012-05-14 NOTE — Consult Note (Signed)
  05/13/12  NEUROBEHAVIORAL STATUS EXAM - CONFIDENTIAL [facility]   [name] was seen on the [facility] owing to functional deficits secondary to traumatic brain injury. According to medical records, a head CT scan recently performed revealed postoperative findings related to evacuation of a left frontal hemorrhagic contusion/hematoma.   PROCEDURES: [1 unit of [address] on 05/13/12] Review of available records Mental Status Exam-2 (brief version) Diagnostic clinical interview   Subjective: Wayne Buckley reported experiencing improving cognitive difficulties since his TBI. He feels "overrun" and very fatigued. In fact, he declined cognitive testing today but said that he would undergo evaluation at a later date. He admitted to suffering from memory loss and diminished attention and concentration. In regard to his accident, he has no memory of that night, and exactly what happened to him is not entirely clear. He is reportedly making strides in therapy.   Objective: Wayne Buckley seemed to have a difficult time verbalizing information and tended to stutter. He appeared confused at times. He frequently broke eye contact and seemed distracted. Emotionally, he appeared in good spirits and denied suffering from any significant signs of clinical psychopathology.   Assessment: MMSE-2 (brief version) = 14/16 (1/3 words recalled after a very short delay).  Wayne Buckley seems to experiencing significant cognitive issues subsequent to his brain injury. His disorientation and confusion at time also make me concerned for him once he is discharged. Further follow-up is warranted as described below.   Plan:    Under go brief cognitive evaluation during this admission. Also, complete comprehensive testing in 8-12 months after discharge.    Work with the social workers to prepare for increased needs and supervision at home. In addition, applying for disability  benefits may be required.    Education of his family is required, particularly after cognitive assessment is complete.   REFERRING DIAGNOSIS: Traumatic brain injury  FINAL DIAGNOSIS:  Traumatic brain injury   Debbe Mounts, Psy.D.  Clinical Neuropsychologist

## 2012-05-14 NOTE — Progress Notes (Signed)
Occupational Therapy Note  Patient Details  Name: Wayne Buckley MRN: [id number] Date of Birth: [birth year] Today's Date: 05/14/2012  Time: 1030-1100 Pt denies pain Individual Therapy  Pt engaged in standing tasks assembling structure with PVC pipe.  Pt correctly assembled from diagram without verbal cues.  Pt maintained standing balance with steady assistance.  Focus on attention, standing balance, activity tolerance, and safety awareness.     [name] [facility] 05/14/2012, 3:09 PM

## 2012-05-14 NOTE — Progress Notes (Signed)
Physical Therapy Note  Patient Details  Name: Wayne Buckley MRN: [id number] Date of Birth: Feb 14, 1951 Today's Date: 05/14/2012  Time: 815-910 55 minutes  No c/o pain.  Gait training with focus on household mobility.  Pt able to gait on carpet and around obstacles with min A with RW, required mod A to correct 2 x LOB due to pt dragging feet when fatigued.  Pt demos decreased awareness of LOB and delayed balance reactions.  NMR in closed chain standing activities to promote trunk and hip control.  Pt with difficulty grading movements for anterior wt shifts off of wall, wall squats, wall push ups and sit to stands and squats holding object.  Pt requires min-mod A for balance and core control.  W/c mobility with supervision, cues for technique but better problem solving obstacle negotiation and turns.  Individual therapy   Mande Auvil 05/14/2012, 9:11 AM

## 2012-05-14 NOTE — Progress Notes (Signed)
Subjective/Complaints: Pt is comfortable, no complaints.  A 12 point review of systems has been performed and if not noted above is otherwise negative.   Objective: Vital Signs: Blood pressure 141/81, pulse 77, temperature 98.6 F (37 C), temperature source Oral, resp. rate 17, weight 77.2 kg (170 lb 3.1 oz), SpO2 98.00%. No results found. No results found for this basename: WBC:2,HGB:2,HCT:2,PLT:2 in the last 72 hours No results found for this basename: NA:2,K:2,CL:2,CO2:2,GLUCOSE:2,BUN:2,CREATININE:2,CALCIUM:2 in the last 72 hours CBG (last 3)  No results found for this basename: GLUCAP:3 in the last 72 hours  Wt Readings from Last 3 Encounters:  05/14/12 77.2 kg (170 lb 3.1 oz)  05/04/12 83.9 kg (184 lb 15.5 oz)  05/04/12 83.9 kg (184 lb 15.5 oz)    Physical Exam:  Constitutional: He appears well-developed and well-nourished.  HENT:  Head: Normocephalic.  Left crani incision clean and dry with staples in place.    Eyes: Pupils are equal, round, and reactive to light.  Neck: Normal range of motion.  Cardiovascular: Normal rate and regular rhythm.  Pulmonary/Chest: Effort normal and breath sounds normal.  Abdominal: Soft. Bowel sounds are normal.  Musculoskeletal: He exhibits no edema.  Neurological: He is alert.  Oriented to self and place today. A. Poor insight.  Right sided weakness UE>LE--?motor apraxia RUE is grossly 3+ to4/5. RLE is  3+/5 prox to 4/5 distally. Pain sense better on right. Speech more automatic. Able to carry simple conversation.  Psychiatric:    calm, collected    Assessment/Plan: 1. 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 Physiatrist is providing close team supervision and 24 hour management of active medical problems listed below. Physiatrist and rehab team continue to assess barriers to discharge/monitor patient  progress toward functional and medical goals.   SR x 4 for safety   FIM: FIM - Bathing Bathing Steps Patient Completed: Chest;Right Arm;Left Arm;Abdomen;Front perineal area;Buttocks;Right upper leg;Left upper leg;Left lower leg (including foot);Right lower leg (including foot) Bathing: 4: Steadying assist  FIM - Upper Body Dressing/Undressing Upper body dressing/undressing steps patient completed: Thread/unthread right sleeve of pullover shirt/dresss;Thread/unthread left sleeve of pullover shirt/dress;Put head through opening of pull over shirt/dress;Pull shirt over trunk Upper body dressing/undressing: 5: Supervision: Safety issues/verbal cues FIM - Lower Body Dressing/Undressing Lower body dressing/undressing steps patient completed: Thread/unthread right pants leg;Thread/unthread left pants leg;Pull pants up/down;Don/Doff right shoe;Don/Doff left sock;Don/Doff right sock Lower body dressing/undressing: 4: Steadying Assist  FIM - Toileting Toileting steps completed by patient: Performs perineal hygiene Toileting: 1: Total-Patient completed zero steps, helper did all 3  FIM - Diplomatic Services operational officer Devices: Grab bars Toilet Transfers: 4-To toilet/BSC: Min A (steadying Pt. > 75%)  FIM - Bed/Chair Transfer Bed/Chair Transfer Assistive Devices: Bed rails Bed/Chair Transfer: 4: Bed > Chair or W/C: Min A (steadying Pt. > 75%);5: Sit > Supine: Supervision (verbal cues/safety issues)  FIM - Locomotion: Wheelchair Distance: 45' Locomotion: Wheelchair: 2: Travels 50 - 149 ft with supervision, cueing or coaxing FIM - Locomotion: Ambulation Locomotion: Ambulation Assistive Devices: Designer, industrial/product Ambulation/Gait Assistance: 3: Mod assist Locomotion: Ambulation: 2: Travels 50 - 149 ft with minimal assistance (Pt.>75%)  Comprehension Comprehension Mode: Auditory Comprehension: 4-Understands basic 75 - 89% of the time/requires cueing 10 - 24% of the  time  Expression Expression Mode: Verbal Expression: 4-Expresses basic 75 - 89% of the time/requires cueing 10 - 24% of the time. Needs helper to  occlude trach/needs to repeat words.  Social Interaction Social Interaction: 4-Interacts appropriately 75 - 89% of the time - Needs redirection for appropriate language or to initiate interaction.  Problem Solving Problem Solving: 3-Solves basic 50 - 74% of the time/requires cueing 25 - 49% of the time  Memory Memory: 3-Recognizes or recalls 50 - 74% of the time/requires cueing 25 - 49% of the time  Medical Problem List and Plan:  1. DVT Prophylaxis/Anticoagulation: Mechanical: Sequential compression devices, below knee Bilateral lower extremities  2. Pain Management: denies any pain.  3. Mood: no signs of distress. Patient with significant cognitive deficits with poor awareness and impaired insight.  4. Neuropsych: This patient is not capable of making decisions on his/her own behalf.  5. ABLA:improved 12.1.  rechjeck tomorrow 6. RUE DVT: no blood thinner for now per NS.  7. Polysubstance abuse: outpt follow up 8. HTN: off BP meds at current time. Monitor with bid checks for trends.  9. Urine retention/neurogenic bladder: urine cx negative , he has been continent 10. FEN: eating well. Recheck electrolytes tomorrow    LOS (Days) 7 A FACE TO FACE EVALUATION WAS PERFORMED  SWARTZ,ZACHARY T 05/14/2012, 6:46 AM

## 2012-05-14 NOTE — Progress Notes (Signed)
Occupational Therapy Session Note  Patient Details  Name: Wayne Buckley MRN: [id number] Date of Birth: November 22, 1950  Today's Date: 05/14/2012 Time: 1330-1415 Time Calculation (min): 45 min  Short Term Goals: Week 1:  OT Short Term Goal 1 (Week 1): Pt will don shirt with supervision OT Short Term Goal 2 (Week 1): Pt will maintain selective attention with min A during funcitonal tasks OT Short Term Goal 3 (Week 1): Pt will perform toileting with mod A sit to stand OT Short Term Goal 4 (Week 1): Pt will demonstrate safety awareness by using the call bell for needs 25% of day OT Short Term Goal 5 (Week 1): Pt will demonstrate intellecual awareness with mod A during functional ADL tasks  Skilled Therapeutic Interventions/Progress Updates:    1:1 focus on functional ambulation with RW around room (including to bathroom), with mod cuing and facilitation for right LE placement and coordination, toileting sit to stand, grooming tasks (brushing teeth and washing hands). Attempted to use computer to find pictures of what his keyboard at home looks like - with total A to work on attention, visual scanning, simple problem solving, working memory in his interest of music. Pt reported he didn't know how to use a computer. Demonstrated confabulation throughout session.  Therapy Documentation Precautions:  Precautions Precautions: Fall Precaution Comments: DVT in right UE- restricted  Restrictions Weight Bearing Restrictions: No Pain: Pain Assessment Pain Assessment: No/denies pain  See FIM for current functional status  Therapy/Group: Individual Therapy  Roney Mans [facility] 05/14/2012, 2:28 PM

## 2012-05-14 NOTE — Progress Notes (Signed)
Physical Therapy Weekly Progress Note  Patient Details  Name: JAVAD SALVA MRN: [id number] Date of Birth: 1950-09-26  Today's Date: 05/14/2012  Patient has met 3 of 3 short term goals.  Pt has improved gait and balance to min A levels, able to propel w/c with supervision cuing.  Pt continues to be limited by trunk and limb ataxia, decreased coordination, impaired insight and awareness into deficits.  Patient will continue to benefit from skilled PT intervention to enhance overall performance with activity tolerance, balance, postural control, ability to compensate for deficits, attention, awareness and coordination.  Patient progressing toward long term goals..  Continue plan of care.  PT Short Term Goals Week 1:  PT Short Term Goal 1 (Week 1): Pt will perform functional transfers min A PT Short Term Goal 1 - Progress (Week 1): Met PT Short Term Goal 2 (Week 1): Pt will demo intellectual awareness with min A PT Short Term Goal 2 - Progress (Week 1): Met PT Short Term Goal 3 (Week 1): Pt will gait with min A 50' in controlled environment PT Short Term Goal 3 - Progress (Week 1): Met Week 2:  PT Short Term Goal 1 (Week 2): Pt will gait in home environment with min A 25' PT Short Term Goal 2 (Week 2): Pt will demo intellectual awareness with supervision cuing  PT Short Term Goal 3 (Week 2): Pt will demo dynamic standing balance with min A for functional task  Skilled Therapeutic Interventions/Progress Updates:  Ambulation/gait training;Community reintegration;DME/adaptive equipment instruction;Neuromuscular re-education;Therapeutic Exercise;UE/LE Strength taining/ROM;Wheelchair Designer, jewellery;Therapeutic Activities;Functional mobility training;Patient/family education;Pain management;Splinting/orthotics;Cognitive remediation/compensation;Balance/vestibular training;Discharge planning    See FIM for current functional  status   Kenia Teagarden 05/14/2012, 8:13 AM

## 2012-05-14 NOTE — Progress Notes (Signed)
Occupational Therapy Session Note  Patient Details  Name: Wayne Buckley MRN: [id number] Date of Birth: 1950-09-08  Today's Date: 05/14/2012 Time: 0730-0815 Time Calculation (min): 45 min  Short Term Goals: Week 1:  OT Short Term Goal 1 (Week 1): Pt will don shirt with supervision OT Short Term Goal 2 (Week 1): Pt will maintain selective attention with min A during funcitonal tasks OT Short Term Goal 3 (Week 1): Pt will perform toileting with mod A sit to stand OT Short Term Goal 4 (Week 1): Pt will demonstrate safety awareness by using the call bell for needs 25% of day OT Short Term Goal 5 (Week 1): Pt will demonstrate intellecual awareness with mod A during functional ADL tasks  Skilled Therapeutic Interventions/Progress Updates:    1:1 self care retraining at shower level. Pt had been incontinent of urine in brief (in bed ; pt's shirt and pants were on the floor next to him- reporting he took them off. Focus on intellectual awareness, sit to stand, standing balance (pt with wide BOS), stand pivot transfers, sequencing, sustained attention. Pt able to orient clothing with extra time. Toilet transfer with min A, toileting with steady A with therapist assisting with hygiene to ensure thoroughness.   Therapy Documentation Precautions:  Precautions Precautions: Fall Precaution Comments: DVT in right UE- restricted  Restrictions Weight Bearing Restrictions: No Pain:  no c/o pain  See FIM for current functional status  Therapy/Group: Individual Therapy  Roney Mans [facility] 05/14/2012, 12:06 PM

## 2012-05-14 NOTE — Progress Notes (Signed)
Speech Language Pathology Daily Session Note  Patient Details  Name: Wayne Buckley MRN: [id number] Date of Birth: [birth year]  Today's Date: 05/14/2012 Time: 0930-1015 Time Calculation (min): 45 min  Short Term Goals: Week 1: SLP Short Term Goal 1 (Week 1): Pt will consume trials of Dys. 3 textures with efficient mastication with Mod verbal cues.  SLP Short Term Goal 2 (Week 1): Pt will utilize swallowing compensatory strategies while consuming current diet with Mod verbal and visual cues.  SLP Short Term Goal 3 (Week 1): Pt will identify common objects with Min phonemic cues.  SLP Short Term Goal 4 (Week 1): Pt will identify 1 physical and 1 cognitive deficit with Max multimodal cueing SLP Short Term Goal 5 (Week 1): Pt will demonstrate selective attention to a functional task for 15 minutes with Max verbal cues for redirection  SLP Short Term Goal 6 (Week 1): Pt will utilize call bell to express wants/needs with Mod multimodal cueing.   Skilled Therapeutic Interventions: Treatment focus on language goals. Pt participated in naming and description task with focus on verbal expression. Pt required Min verbal and question cues to be more specific in his descriptions and to increase his length of utterance. Pt also required Mod verbal cues to self-monitor and correct errors.  Pt continues to require Mod question cues for intellectual awareness into cognitive deficits and overall safety awareness.    FIM:  Comprehension Comprehension Mode: Auditory Comprehension: 3-Understands basic 50 - 74% of the time/requires cueing 25 - 50%  of the time Expression Expression Mode: Verbal Expression: 4-Expresses basic 75 - 89% of the time/requires cueing 10 - 24% of the time. Needs helper to occlude trach/needs to repeat words. Social Interaction Social Interaction: 4-Interacts appropriately 75 - 89% of the time - Needs redirection for appropriate language or to initiate interaction. Problem  Solving Problem Solving: 3-Solves basic 50 - 74% of the time/requires cueing 25 - 49% of the time Memory Memory: 3-Recognizes or recalls 50 - 74% of the time/requires cueing 25 - 49% of the time FIM - Eating Eating Activity: 5: Supervision/cues;4: Helper checks for pocketed food;5: Set-up assist for open containers  Pain Pain Assessment Pain Assessment: No/denies pain  Therapy/Group: Individual Therapy  Renessa Wellnitz 05/14/2012, 3:47 PM

## 2012-05-15 ENCOUNTER — Inpatient Hospital Stay (HOSPITAL_COMMUNITY): Payer: 59

## 2012-05-15 ENCOUNTER — Inpatient Hospital Stay (HOSPITAL_COMMUNITY): Payer: 59 | Admitting: Physical Therapy

## 2012-05-15 ENCOUNTER — Encounter (HOSPITAL_COMMUNITY): Payer: 59 | Admitting: Occupational Therapy

## 2012-05-15 ENCOUNTER — Inpatient Hospital Stay (HOSPITAL_COMMUNITY): Payer: 59 | Admitting: Speech Pathology

## 2012-05-15 ENCOUNTER — Encounter (HOSPITAL_COMMUNITY): Payer: 59

## 2012-05-15 DIAGNOSIS — F1010 Alcohol abuse, uncomplicated: Secondary | ICD-10-CM

## 2012-05-15 DIAGNOSIS — W19XXXA Unspecified fall, initial encounter: Secondary | ICD-10-CM

## 2012-05-15 DIAGNOSIS — S069X9A Unspecified intracranial injury with loss of consciousness of unspecified duration, initial encounter: Secondary | ICD-10-CM

## 2012-05-15 LAB — CBC
Hemoglobin: 10.9 g/dL — ABNORMAL LOW (ref 13.0–17.0)
MCH: 28.9 pg (ref 26.0–34.0)
RBC: 3.77 MIL/uL — ABNORMAL LOW (ref 4.22–5.81)

## 2012-05-15 LAB — BASIC METABOLIC PANEL
CO2: 27 mEq/L (ref 19–32)
Calcium: 9.4 mg/dL (ref 8.4–10.5)
Glucose, Bld: 96 mg/dL (ref 70–99)
Potassium: 4.1 mEq/L (ref 3.5–5.1)
Sodium: 138 mEq/L (ref 135–145)

## 2012-05-15 NOTE — Progress Notes (Signed)
Occupational Therapy Note  Patient Details  Name: Wayne Buckley MRN: [id number] Date of Birth: 07-05-1950 Today's Date: 05/15/2012  Time: 1100-1155 Pt denies pain Individual Therapy Focus on intellectual awareness, problem solving, memory, sequencing, static and dynamic standing balance, and functional ambulation.  Pt continues to exhibit decreased intellectual awareness of deficits, decreased problems solving, decreased memory and sequencing.  Pt engaged in static standing balance while using BUE for activity.  Pt transitioned to dynamic standing balance activity tossing a large ball to other individual.  Pt required min A while standing but able to reach to floor to retrieve ball.  [name] [facility] 05/15/2012, 12:01 PM

## 2012-05-15 NOTE — Progress Notes (Signed)
Physical Therapy Note  Patient Details  Name: JANSEL VONSTEIN MRN: [id number] Date of Birth: [birth year] Today's Date: 05/15/2012  Time: 900-942 42 minutes  No c/o pain.  Gait with RW controlled environment with min A.  Pt with improved ability to stay close to RW with min cuing, decreased sway and R lean, continues to require manual facilitation for grading movements and RW control.  NMR closed chain standing with focus on improved trunk coordination and control.  Pt with much improved balance noted, requiring only min A throughout reaching, squatting, bending activities.  Kinetron in standing for LE coordination and control with min A for controlling wt shifts.  Pt fatigues easily but is improving overall balance and functional mobility.  Individual therapy   Taisha Pennebaker 05/15/2012, 9:42 AM

## 2012-05-15 NOTE — Progress Notes (Signed)
Physical Therapy Note  Patient Details  Name: Wayne Buckley MRN: [id number] Date of Birth: [birth year] Today's Date: 05/15/2012  Time: 1300-1400 60 minutes  No c/o pain.  Treatment focused on NMR for core and LE control, coordination and grading movements.  Gait training with resistance applied to B hips, pt with some improved grading, continues with slight R lean requiring min manual facilitation for balance.  Resisted sit to stands with improvement in motor control.  Quadruped and tall kneeling exercises with pt demo'ing increased core strength, able to tolerate longer and with improved balance.  Supine abdominal mm activities with adductor squeeze: bridges, mini crunches, lower abdominal exercise.  Pt fatigues quickly in adductors but overall good tolerance to session.  Continued resistive walking with slight improvements but pt continues to lean R and drag feet when fatigued.  Individual therapy   Franco Duley 05/15/2012, 2:25 PM

## 2012-05-15 NOTE — Progress Notes (Signed)
Occupational Therapy Weekly Progress Note  Patient Details  Name: Wayne Buckley MRN: [id number] Date of Birth: [birth year]  Today's Date: 05/15/2012 Time: 0730-0815 Time Calculation (min): 45 min  Patient has met 2 of 5 short term goals and progression towards one. Pt can perform bathing, dressing and grooming with min A for balance. Pt can perform short distance functional ambulation with min to mod A with RW with min to mod facilitation of right LE. Pt continues to demonstrate decreased intellectual awareness impacting his insight into his progress and situation, not fully oriented to time of day. Pt demonstrates Rancho Level VI behaviors with occassional confabulation .  Patient continues to demonstrate the following deficits:muscle weakness, impaired timing and sequencing, unbalanced muscle activation, decreased coordination and decreased motor planning, decreased motor planning, decreased attention, decreased awareness, decreased problem solving, decreased safety awareness, decreased memory and delayed processing and decreased standing balance, decreased postural control, hemiplegia and decreased balance strategiesand therefore will continue to benefit from skilled OT intervention to enhance overall performance with BADL and Reduce care partner burden.  Patient progressing toward long term goals..  Continue plan of care.  OT Short Term Goals Week 1:  OT Short Term Goal 1 (Week 1): Pt will don shirt with supervision OT Short Term Goal 1 - Progress (Week 1): Met OT Short Term Goal 2 (Week 1): Pt will maintain selective attention with min A during funcitonal tasks OT Short Term Goal 2 - Progress (Week 1): Progressing toward goal OT Short Term Goal 3 (Week 1): Pt will perform toileting with mod A sit to stand OT Short Term Goal 3 - Progress (Week 1): Met OT Short Term Goal 4 (Week 1): Pt will demonstrate safety awareness by using the call bell for needs 25% of day OT Short Term Goal 4 - Progress  (Week 1): Not met OT Short Term Goal 5 (Week 1): Pt will demonstrate intellecual awareness with mod A during functional ADL tasks OT Short Term Goal 5 - Progress (Week 1): Not met Week 2:  OT Short Term Goal 1 (Week 2): Pt will demonstrate intellecual awareness in functional ADL tasks with mod A OT Short Term Goal 2 (Week 2): Pt would alert staff of toileting needs with min A to be contient OT Short Term Goal 3 (Week 2): Pt will don LB clothing with only steady A OT Short Term Goal 4 (Week 2): Pt will perform sit to stands with supervision with min cuing for hand placement   Skilled Therapeutic Interventions/Progress Updates:    continue with POC  Self care retraining including showering, dressing, grooming and self feeding with focus on safety with RW, attention to task in quiet environment, intellectual awareness, orientation in particular time of day, safety awareness, right UE/LE coordination, simple problem solving. Pt continues to pack mouth full of food without consistent cuing. Pt with increased coordination of right LE with functional ambulation requiring less verbal and tactile cuing.  Therapy Documentation Precautions:  Precautions Precautions: Fall Precaution Comments: DVT in right UE- restricted  Restrictions Weight Bearing Restrictions: No Pain: Pain Assessment Pain Assessment: No/denies pain Pain Score: 0-No pain ADL:  see FIM  See FIM for current functional status  Therapy/Group: Individual Therapy  Roney Mans [facility] 05/15/2012, 9:27 AM

## 2012-05-15 NOTE — Progress Notes (Signed)
Speech Language Pathology Session & Weekly Progress Notes  Patient Details  Name: Wayne Buckley MRN: [id number] Date of Birth: 1950/09/03  Today's Date: 05/15/2012 Time: 1015-1055 Time Calculation (min): 40 min  Short Term Goals: Week 1: SLP Short Term Goal 1 (Week 1): Pt will consume trials of Dys. 3 textures with efficient mastication with Mod verbal cues.  SLP Short Term Goal 1 - Progress (Week 1): Not met SLP Short Term Goal 2 (Week 1): Pt will utilize swallowing compensatory strategies while consuming current diet with Mod verbal and visual cues.  SLP Short Term Goal 2 - Progress (Week 1): Met SLP Short Term Goal 3 (Week 1): Pt will identify common objects with Min phonemic cues.  SLP Short Term Goal 3 - Progress (Week 1): Met SLP Short Term Goal 4 (Week 1): Pt will identify 1 physical and 1 cognitive deficit with Max multimodal cueing SLP Short Term Goal 4 - Progress (Week 1): Met SLP Short Term Goal 5 (Week 1): Pt will demonstrate selective attention to a functional task for 15 minutes with Max verbal cues for redirection  SLP Short Term Goal 5 - Progress (Week 1): Met SLP Short Term Goal 6 (Week 1): Pt will utilize call bell to express wants/needs with Mod multimodal cueing.  SLP Short Term Goal 6 - Progress (Week 1): Not met  New Short Term Goals:  Week 2: SLP Short Term Goal 1 (Week 2): Pt will utilize call bell to express wants/needs with Max A multimodal cueing SLP Short Term Goal 2 (Week 2): Pt will consume current diet with Min A verbal cues for utilization of swallowing compensatory strategies SLP Short Term Goal 3 (Week 2): Pt will demonstrate selective attention to a functional task in a mildly distracting enviornment for ~30 minutes with Min verbal cues for redirection  SLP Short Term Goal 4 (Week 2): Pt will consume trails of Dys. 3 textures and demonstrate efficient mastication with Mod verbal cues without overt s/s of aspiration  SLP Short Term Goal 5 (Week 2): Pt will  demonstrate functional problem solving for functional tasks with supervision verbal cues.   Weekly Progress Updates: Pt has made functional gains and has met 4 out of 6 STG's this reporting period. Currently, pt is demonstrating behaviors consistent with a Rancho Level VI and requires Min verbal cues for functional problem solving and selective attention and Mod-Max verbal cues for working memory, Engineer, building services and intellectual awareness of current deficits. Pt is currently consuming Dys. 2 textures with thin liquids and requires Min A verbal cues for utilization of swallowing compensatory strategies. Pt's overall verbal expression has improved and pt is able to verbalize his basic wants and needs. Pt would benefit from continued skilled SLP intervention to maximize cognitive recovery and swallowing function with least restrictive diet to maximize overall functional independence and safety for eventual discharge home.   Daily Session Skilled Therapeutic Intervention: Treatment focus on cognitive goals. Pt participated in a mildly complex task that required new learning. Pt required Mod verbal, visual, and question cues to utilize external memory aid to recall rules of task. Pt also required Mod A question and verbal cues for problem solving and reasoning throughout the task.  FIM:  Comprehension Comprehension Mode: Auditory Comprehension: 4-Understands basic 75 - 89% of the time/requires cueing 10 - 24% of the time Expression Expression Mode: Verbal Expression: 4-Expresses basic 75 - 89% of the time/requires cueing 10 - 24% of the time. Needs helper to occlude trach/needs to repeat words.  Social Interaction Social Interaction: 4-Interacts appropriately 75 - 89% of the time - Needs redirection for appropriate language or to initiate interaction. Problem Solving Problem Solving: 4-Solves basic 75 - 89% of the time/requires cueing 10 - 24% of the time Memory Memory: 3-Recognizes or recalls 50 -  74% of the time/requires cueing 25 - 49% of the time Pain No/Denies Pain  Therapy/Group: Individual Therapy  Wayne Buckley 05/15/2012, 1:56 PM

## 2012-05-15 NOTE — Progress Notes (Signed)
Social Work Patient ID: Wayne Buckley, male   DOB: [birth year], 61 y.o.   MRN: [id number]  Have reviewed team conference with pt and wife - both aware targeted d/c is 12/10, however, pt quickly distracted when discussing this.  Wife plan to begin "hands on" education the end of next week.  Wife denied any concerns about current progress.  Will continue to follow.  Reminded wife that we are happy to present some TBI education with their daughter as well if they would like. \ Wayne Buckley

## 2012-05-15 NOTE — Progress Notes (Signed)
[facility] Individual Statement of Services  Patient Name:  Wayne Buckley  Date:  05/15/2012  Welcome to the [facility]patient [facility].  Our goal is to provide you with an individualized program based on your diagnosis and situation, designed to meet your specific needs.  With this comprehensive rehabilitation program, you will be expected to participate in at least 3 hours of rehabilitation therapies Monday-Friday, with modified therapy programming on the weekends.  Your rehabilitation program will include the following services:  Physical Therapy (PT), Occupational Therapy (OT), Speech Therapy (ST), 24 hour per day rehabilitation nursing, Therapeutic Recreaction (TR), Neuropsychology, Case Management (Social Worker), Rehabilitation Medicine, Nutrition Services and Pharmacy Services  Weekly team conferences will be held on Tuesdays to discuss your progress.  Your  Social Worker will talk with you frequently to get your input and to update you on team discussions.  Team conferences with you and your family in attendance may also be held.  Expected length of stay: 2-3 weeks  Overall anticipated outcome: supervision  Depending on your progress and recovery, your program may change.  Your  Social Worker will coordinate services and will keep you informed of any changes.  Your  Social Worker's name and contact numbers are listed  below.  The following services may also be recommended but are not provided by the [facility]:   Driving Evaluations  Home Health Rehabiltiation Services  Outpatient Rehabilitatation [facility]  Vocational Rehabilitation   Arrangements will be made to provide these services after discharge if needed.  Arrangements include referral to agencies that provide these services.  Your insurance has been verified to be:  [facility] Your primary doctor is:  Dr. Tenny Craw  Pertinent information will be shared with your doctor and your  insurance company.  Social Worker:  [place], [place] [phone] or ([phone]  Information discussed with and copy given to patient by: [name], 05/15/2012, 2:09 PM

## 2012-05-15 NOTE — Patient Care Conference (Signed)
Inpatient RehabilitationTeam Conference Note Date: 05/14/2012   Time: 3:10 PM    Patient Name: Wayne Buckley      Medical Record Number: [id number]  Date of Birth: 11-Sep-1950 Sex: Male         Room/Bed: 4025/4025-01 Payor Info: Payor: Advertising copywriter  Plan: [place] [employer]  Product Type: *No Product type*     Admitting Diagnosis: TBI  Admit Date/Time:  05/07/2012  7:29 PM Admission Comments: No comment available   Primary Diagnosis:  Subdural hematoma, post-traumatic Principal Problem: Subdural hematoma, post-traumatic  Patient Active Problem List   Diagnosis Date Noted  . Alcohol abuse 05/04/2012  . Hypokalemia 05/04/2012  . Urinary retention 05/04/2012  . Encephalopathy acute 05/04/2012  . Aspiration pneumonia 05/02/2012  . Acute respiratory failure 04/28/2012  . Subdural hematoma, post-traumatic 04/28/2012    Expected Discharge Date: Expected Discharge Date: 05/28/12  Team Members Present: Physician: Dr. Faith Rogue Social Worker Present: [name], LCSW Nurse Present: Carmie End, RN PT Present: Reggy Eye, PT OT Present: Roney Mans, Loistine Chance, OT;Ardis Rowan, COTA SLP Present: Feliberto Gottron, SLP Other (Discipline and Name): Tora Duck, PPS Coordinator and Bayard Hugger, RN     Current Status/Progress Goal Weekly Team Focus  Medical   left TBI. improve right sided motor, language better. limited insight  wound care, safety ed  wound care, good sleep hygiene   Bowel/Bladder   continent toileting required every 3 hours at hs lbm 11-25 continent hard bm   min assist   toilet every 3 hours at hs    Swallow/Nutrition/ Hydration   Dys. 2 textures with thin liquids, Min A-Supervision for use of compensatory strategies  Supevision  trails of Dys. 3 textures    ADL's   supervision with UB ADLs, min A with LB ADLs  supervision  coordination of right hand, safety, intellecual awareness, standing balance, stand pivot transfers, attention    Mobility   min A  supervision  NMR, balance, gait training   Communication   Min A  Min-Mod A  word-finding   Safety/Cognition/ Behavioral Observations  Mod A  Min A  attention, awareness, problem solving   Pain   n/a         Skin   left crani staples intact   d/c staples  no new breakdown   educate family on skin care     Rehab Goals Patient on target to meet rehab goals: Yes *See Interdisciplinary Assessment and Plan and progress notes for long and short-term goals  Barriers to Discharge: limited insight and awareness    Possible Resolutions to Barriers:  family ed, supervision at home    Discharge Planning/Teaching Needs:  home with wife and 4 year old daughter - wife arranging 24/7 supervision/ assistance      Team Discussion:  Making good progress but continues with very limited insight into cognitive deficits.  Revisions to Treatment Plan:  None   Continued Need for Acute Rehabilitation Level of Care: The patient requires daily medical management by a physician with specialized training in physical medicine and rehabilitation for the following conditions: Daily direction of a multidisciplinary physical rehabilitation program to ensure safe treatment while eliciting the highest outcome that is of practical value to the patient.: Yes Daily medical management of patient stability for increased activity during participation in an intensive rehabilitation regime.: Yes  Jonnell Hentges 05/15/2012, 2:07 PM

## 2012-05-15 NOTE — Progress Notes (Signed)
Subjective/Complaints: Pt is comfortable, ready for therapy.  A 12 point review of systems has been performed and if not noted above is otherwise negative.   Objective: Vital Signs: Blood pressure 146/91, pulse 85, temperature 98.6 F (37 C), temperature source Oral, resp. rate 20, weight 77.2 kg (170 lb 3.1 oz), SpO2 95.00%. No results found. No results found for this basename: WBC:2,HGB:2,HCT:2,PLT:2 in the last 72 hours No results found for this basename: NA:2,K:2,CL:2,CO2:2,GLUCOSE:2,BUN:2,CREATININE:2,CALCIUM:2 in the last 72 hours CBG (last 3)  No results found for this basename: GLUCAP:3 in the last 72 hours  Wt Readings from Last 3 Encounters:  05/14/12 77.2 kg (170 lb 3.1 oz)  05/04/12 83.9 kg (184 lb 15.5 oz)  05/04/12 83.9 kg (184 lb 15.5 oz)    Physical Exam:  Constitutional: He appears well-developed and well-nourished.  HENT:  Head: Normocephalic.  Left crani incision clean and dry with staples in place.   Scab centrally Eyes: Pupils are equal, round, and reactive to light.  Neck: Normal range of motion.  Cardiovascular: Normal rate and regular rhythm.  Pulmonary/Chest: Effort normal and breath sounds normal.  Abdominal: Soft. Bowel sounds are normal.  Musculoskeletal: He exhibits no edema.  Neurological: He is alert.  Oriented to self and place today. A. Poor insight.  Right sided weakness UE>LE--?motor apraxia RUE is grossly 3+ to4/5. RLE is  3+/5 prox to 4/5 distally. Pain sense better on right. Speech more automatic. Able to carry simple conversation.  Psychiatric:    calm, collected    Assessment/Plan: 1. 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 Physiatrist is providing close team supervision and 24 hour management of active medical problems listed below. Physiatrist and rehab team continue to assess barriers to  discharge/monitor patient progress toward functional and medical goals.   SR x 3 without issues Remove staples once scab is free in center of wound   FIM: FIM - Bathing Bathing Steps Patient Completed: Chest;Right Arm;Left Arm;Abdomen;Front perineal area;Buttocks;Right upper leg;Left upper leg;Left lower leg (including foot);Right lower leg (including foot) Bathing: 4: Steadying assist  FIM - Upper Body Dressing/Undressing Upper body dressing/undressing steps patient completed: Thread/unthread right sleeve of pullover shirt/dresss;Thread/unthread left sleeve of pullover shirt/dress;Put head through opening of pull over shirt/dress;Pull shirt over trunk Upper body dressing/undressing: 5: Supervision: Safety issues/verbal cues FIM - Lower Body Dressing/Undressing Lower body dressing/undressing steps patient completed: Thread/unthread right pants leg;Thread/unthread left pants leg;Pull pants up/down;Don/Doff right shoe;Don/Doff left sock;Don/Doff right sock;Don/Doff left shoe Lower body dressing/undressing: 4: Steadying Assist  FIM - Toileting Toileting steps completed by patient: Adjust clothing prior to toileting Toileting Assistive Devices: Grab bar or rail for support Toileting: 2: Max-Patient completed 1 of 3 steps  FIM - Diplomatic Services operational officer Devices: Grab bars Toilet Transfers: 4-To toilet/BSC: Min A (steadying Pt. > 75%)  FIM - Bed/Chair Transfer Bed/Chair Transfer Assistive Devices: Bed rails Bed/Chair Transfer: 4: Chair or W/C > Bed: Min A (steadying Pt. > 75%);4: Bed > Chair or W/C: Min A (steadying Pt. > 75%);4: Supine > Sit: Min A (steadying Pt. > 75%/lift 1 leg)  FIM - Locomotion: Wheelchair Distance: 45' Locomotion: Wheelchair: 5: Travels 150 ft or more: maneuvers on rugs and over door sills with supervision, cueing or coaxing FIM - Locomotion: Ambulation Locomotion: Ambulation Assistive Devices: Designer, industrial/product Ambulation/Gait Assistance: 3:  Mod assist Locomotion: Ambulation: 1: Travels less than 50 ft with moderate assistance (Pt:  50 - 74%)  Comprehension Comprehension Mode: Auditory Comprehension: 3-Understands basic 50 - 74% of the time/requires cueing 25 - 50%  of the time  Expression Expression Mode: Verbal Expression: 4-Expresses basic 75 - 89% of the time/requires cueing 10 - 24% of the time. Needs helper to occlude trach/needs to repeat words.  Social Interaction Social Interaction: 4-Interacts appropriately 75 - 89% of the time - Needs redirection for appropriate language or to initiate interaction.  Problem Solving Problem Solving: 3-Solves basic 50 - 74% of the time/requires cueing 25 - 49% of the time  Memory Memory: 3-Recognizes or recalls 50 - 74% of the time/requires cueing 25 - 49% of the time  Medical Problem List and Plan:  1. DVT Prophylaxis/Anticoagulation: Mechanical: Sequential compression devices, below knee Bilateral lower extremities  2. Pain Management: denies any pain.  3. Mood: no signs of distress. Patient with significant cognitive deficits with poor awareness and impaired insight.  4. Neuropsych: This patient is not capable of making decisions on his/her own behalf.  5. ABLA:improved 12.1.  rechjeck tomorrow 6. RUE DVT: no blood thinner for now per NS.  7. Polysubstance abuse: outpt follow up 8. HTN: off BP meds at current time. Monitor with bid checks for trends.  9. Urine retention/neurogenic bladder: urine cx negative , he has been continent 10. FEN: eating well. Recheck electrolytes today    LOS (Days) 8 A FACE TO FACE EVALUATION WAS PERFORMED  SWARTZ,ZACHARY T 05/15/2012, 6:54 AM

## 2012-05-16 ENCOUNTER — Inpatient Hospital Stay (HOSPITAL_COMMUNITY): Payer: 59 | Admitting: Speech Pathology

## 2012-05-16 NOTE — Progress Notes (Signed)
Subjective/Complaints: No problems. Denies pain. Slept well. A 12 point review of systems has been performed and if not noted above is otherwise negative.   Objective: Vital Signs: Blood pressure 135/88, pulse 92, temperature 99.3 F (37.4 C), temperature source Oral, resp. rate 20, weight 77 kg (169 lb 12.1 oz), SpO2 97.00%. No results found.  Basename 05/15/12 0614  WBC 8.9  HGB 10.9*  HCT 33.4*  PLT 684*    Basename 05/15/12 0614  NA 138  K 4.1  CL 103  CO2 27  GLUCOSE 96  BUN 15  CREATININE 0.62  CALCIUM 9.4   CBG (last 3)  No results found for this basename: GLUCAP:3 in the last 72 hours  Wt Readings from Last 3 Encounters:  05/15/12 77 kg (169 lb 12.1 oz)  05/04/12 83.9 kg (184 lb 15.5 oz)  05/04/12 83.9 kg (184 lb 15.5 oz)    Physical Exam:  Constitutional: He appears well-developed and well-nourished.  HENT:  Head: Normocephalic.  Left crani incision clean and dry with staples in place.   Scab centrally Eyes: Pupils are equal, round, and reactive to light.  Neck: Normal range of motion.  Cardiovascular: Normal rate and regular rhythm.  Pulmonary/Chest: Effort normal and breath sounds normal.  Abdominal: Soft. Bowel sounds are normal.  Musculoskeletal: He exhibits no edema.  Neurological: He is alert.  Oriented to self and place today. A. Poor insight.  Right sided weakness UE>LE--?motor apraxia RUE is grossly 3+ to4/5. RLE is  3+/5 prox to 4/5 distally. Pain sense better on right. Speech more automatic. Able to carry simple conversation.  Psychiatric:    calm, collected    Assessment/Plan: 1. 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 Physiatrist is providing close team supervision and 24 hour management of active medical problems listed below. Physiatrist and rehab team continue to assess barriers to discharge/monitor  patient progress toward functional and medical goals.   SR x 3 without issues   FIM: FIM - Bathing Bathing Steps Patient Completed: Chest;Right Arm;Left Arm;Abdomen;Front perineal area;Buttocks;Right upper leg;Left upper leg;Left lower leg (including foot);Right lower leg (including foot) Bathing: 4: Steadying assist  FIM - Upper Body Dressing/Undressing Upper body dressing/undressing steps patient completed: Thread/unthread right sleeve of pullover shirt/dresss;Thread/unthread left sleeve of pullover shirt/dress;Put head through opening of pull over shirt/dress;Pull shirt over trunk Upper body dressing/undressing: 5: Supervision: Safety issues/verbal cues FIM - Lower Body Dressing/Undressing Lower body dressing/undressing steps patient completed: Thread/unthread right pants leg;Thread/unthread left pants leg;Pull pants up/down;Don/Doff right shoe;Don/Doff left sock;Don/Doff right sock;Don/Doff left shoe Lower body dressing/undressing: 4: Steadying Assist  FIM - Toileting Toileting steps completed by patient: Adjust clothing prior to toileting;Adjust clothing after toileting Toileting Assistive Devices: Grab bar or rail for support Toileting: 3: Mod-Patient completed 2 of 3 steps  FIM - Diplomatic Services operational officer Devices: Grab bars Toilet Transfers: 4-From toilet/BSC: Min A (steadying Pt. > 75%);4-To toilet/BSC: Min A (steadying Pt. > 75%)  FIM - Bed/Chair Transfer Bed/Chair Transfer Assistive Devices: Bed rails Bed/Chair Transfer: 4: Chair or W/C > Bed: Min A (steadying Pt. > 75%);4: Bed > Chair or W/C: Min A (steadying Pt. > 75%);5: Sit > Supine: Supervision (verbal cues/safety issues)  FIM - Locomotion: Wheelchair Distance: 45' Locomotion: Wheelchair: 5: Travels 150 ft or more: maneuvers on rugs and over door sills with supervision, cueing or coaxing FIM - Locomotion: Ambulation Locomotion: Ambulation Assistive Devices: Environmental consultant -  Rolling Ambulation/Gait  Assistance: 3: Mod assist Locomotion: Ambulation: 2: Travels 50 - 149 ft with minimal assistance (Pt.>75%)  Comprehension Comprehension Mode: Auditory Comprehension: 4-Understands basic 75 - 89% of the time/requires cueing 10 - 24% of the time  Expression Expression Mode: Verbal Expression: 4-Expresses basic 75 - 89% of the time/requires cueing 10 - 24% of the time. Needs helper to occlude trach/needs to repeat words.  Social Interaction Social Interaction: 4-Interacts appropriately 75 - 89% of the time - Needs redirection for appropriate language or to initiate interaction.  Problem Solving Problem Solving: 4-Solves basic 75 - 89% of the time/requires cueing 10 - 24% of the time  Memory Memory: 3-Recognizes or recalls 50 - 74% of the time/requires cueing 25 - 49% of the time  Medical Problem List and Plan:  1. DVT Prophylaxis/Anticoagulation: Mechanical: Sequential compression devices, below knee Bilateral lower extremities  2. Pain Management: denies any pain.  3. Mood: no signs of distress. Patient with significant cognitive deficits with poor awareness and impaired insight.  4. Neuropsych: This patient is not capable of making decisions on his/her own behalf.  5. ABLA:improved 10.9--follow up later this week   6. RUE DVT: no blood thinner for now per NS.  7. Polysubstance abuse: outpt follow up 8. HTN: off BP meds at current time. Monitor with bid checks for trends.  9. Urine retention/neurogenic bladder: generally continent 10. FEN: eating well.    LOS (Days) 9 A FACE TO FACE EVALUATION WAS PERFORMED  Wayne Buckley T 05/16/2012, 6:01 AM

## 2012-05-16 NOTE — Consult Note (Signed)
NEUROCOGNITIVE TESTING - [facility]   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  According to his medical record, he was admitted on 04/28/12 in a comatose state.  CT of his head revealed left frontal lobe IPH, left frontal subdural hematoma and left subarachnoid hematoma.  Left to right midline shift was also seen with suspicion of early uncal herniation.  He was intubated for airway protection and underwent left craniotomy for evacuation of subdural hematoma, left frontal hemorrhage and partial left frontal lobectomy same day.  Follow-up CCT revealed resolution of subarachnoid hematoma and stable post-operative changes.  He was reportedly extubated on 05/02/2012 and has been observed to have inattention with distractibility, confusion, and expressive deficits.    PROCEDURES: [3 units of 16109 on 05/15/12]  The following tests were performed during today's visit: Repeatable Battery for the Assessment of Neuropsychological Status (RBANS, form A), Beck Depression Inventory (fast screen).  Test results are as follows:   RBANS Indices Scaled Score Percentile Description  Immediate Memory  44 < 1 Profoundly Impaired  Visuospatial/Constructional 89 23 Below Average  Language 74 4 Impaired  Attention 68 2 Profoundly Impaired  Delayed Memory 56 < 1 Profoundly Impaired  Total Score 57 < 1 Profoundly Impaired   RBANS Subtests Raw Score Percentile Description  List Learning 9 < 1 Profoundly Impaired  Story Memory 5 < 1 Profoundly Impaired  Figure Copy 17 25 Average  Line Orientation 16 39 Average  Picture Naming 10 73 Average  Semantic Fluency 4 < 1 Profoundly Impaired  Digit Span 9 27 Average  Coding 16 < 1 Profoundly Impaired  List Recall 0 < 1 Profoundly Impaired  List Recognition 15 < 1 Profoundly Impaired  Story Recall 0 < 1  Profoundly Impaired  Figure recall 10 19 Below Average   Beck Depression inventory (fast screen) Raw Score = 0 Description = WNL   On an embedded measure of performance validity, [name] scored within normal limits and there were no behavioral observations that suggested suboptimal effort.  As such, test results likely represent an accurate portrayal of his current level of cognitive functioning.    Test results revealed significant impairment in multiple cognitive domains; most prominently in auditory memory (whether information was presented by rote or within a specific context), complex attention, and semantic fluency.  It is notable that on auditory memory tasks, he did not benefit from repeated exposure to information and his immediate recollection of studied information often did not make contextual sense.  [name] performances were intact on visuospatial tasks, and on a task of simple attention.  Additionally, his incidental memory for visual information was slightly improved from other memory performances (below average range). Behaviorally, [name] quickly lost track of what we were doing; even mid-task at times.  Also, he rapidly forgot conversations and asked the examiner the same question she answered a few minutes earlier.  An intention tremor was observed in his right hand.  His speech was stuttered at times, particularly when there was a specific word that he wanted to use.  Finally, facial grimacing (e.g. smiling as in a tic) was observed throughout the evaluation.    Overall, [name]'s neurocognitive profile is suggestive of marked impairment in multiple aspects of cognitive functioning.  The areas of impairment are expected and consistent with the head injury that he sustained.  Although exact details of the injury are  unknown, it appears that the head injury was moderate to severe.  In such cases, it is difficult to predict the exact course of cognitive recovery.  It is expected  that some cognitive recovery will occur, but it is unclear how quickly or to what extent his cognitive functioning will return.    From an emotional standpoint, [name] responses to a self-report measure of mood symptoms were not suggestive of the presence of significant depression at this time.  As such, upbeat mood is likely serving as a protective factor and also helping him to optimize his recovery.    In light of these findings, the following recommendations are provided and were discussed with [name] and with his wife (via telephone) immediately following the testing session.  Once all questions had been answered, the feedback sessions were concluded.    RECOMMENDATIONS:  Recommendations for treatment team:     When interacting with [name], directions and information should be provided in a simple, straight forward manner, and the treatment team should avoid giving multiple instructions simultaneously.    Owing to his relative strength in visual memory, [name] may benefit from being provided with written information when possible.  Additionally, if his family or treatment team asks him to write down important information, this may facilitate better memory for the information.     To the extent possible, multitasking should be avoided.   [name] requires more time than typical to process information. The treatment team may benefit from waiting for a verbal response to information before presenting additional information.    Performance will generally be best in a structured, routine, and familiar environment, as opposed to situations involving complex problems.   Recommendations for discharge planning:    Complete a comprehensive neuropsychological evaluation as an outpatient in 8-12 months to assess for interval change.  To schedule this testing with Dr. Wylene Simmer, he could contact her at: [phone].      [name] wife and family should understand that his cognitive deficits are  genuine and he is likely giving his best effort to remember.  They will probably have to repeat themselves multiple times and he still may not recall important details.     Mr. Gladu family may choose to write down, or ask him to write down, important details in order to capitalize on his strength in visual memory.     Mr. Laursen should utilize other compensatory strategies (e.g. keep a journal or calendar with important information written in it; use of reminder alarms to keep track of appointments, etc.).     Engaging in cognitively stimulating activities (e.g. activities on websites such as lumosity) has been shown to improve attention and processing speed post-head injury.  To the extent that he enjoys these activities, he is encouraged to engage in them.     Mr. Fayson upbeat mood is likely currently serving as a protective factor.  He and his family should strive to keep him engaged in enjoyable activities in order to preserve emotional and cognitive wellbeing.     Strive to maintain a healthy lifestyle (e.g., proper diet and exercise) in order to promote physical, cognitive and emotional health.    The patient should refrain from driving at this time.    Leavy Cella, Psy.D.  Clinical Neuropsychologist

## 2012-05-17 ENCOUNTER — Inpatient Hospital Stay (HOSPITAL_COMMUNITY): Payer: 59

## 2012-05-17 ENCOUNTER — Inpatient Hospital Stay (HOSPITAL_COMMUNITY): Payer: 59 | Admitting: Physical Therapy

## 2012-05-17 ENCOUNTER — Inpatient Hospital Stay (HOSPITAL_COMMUNITY): Payer: 59 | Admitting: *Deleted

## 2012-05-17 ENCOUNTER — Inpatient Hospital Stay (HOSPITAL_COMMUNITY): Payer: 59 | Admitting: Speech Pathology

## 2012-05-17 NOTE — Progress Notes (Signed)
Physical Therapy Session Note  Patient Details  Name: Wayne Buckley MRN: [id number] Date of Birth: 1950/10/04  Today's Date: 05/17/2012 Time: 1000-1030 Time Calculation (min): 30 min  Short Term Goals: Week 2:  PT Short Term Goal 1 (Week 2): Pt will gait in home environment with min A 25' PT Short Term Goal 2 (Week 2): Pt will demo intellectual awareness with supervision cuing  PT Short Term Goal 3 (Week 2): Pt will demo dynamic standing balance with min A for functional task  Skilled Therapeutic Interventions/Progress Updates:   Gait training with RW x 160' with min@, pt with severe hip external rotation throughout gait cycle, progressed to HHA on L with min to mod@.  Focused treatment on walking with feet pointed forward in parallel bars, pt still with difficulty getting feet forward and with decreased BOS, heels almost touching.  Gait with shopping cart to increase speed with pt focusing on turning feet inward x 160', needing less verbal cues and with improvement in foot placement. 10 steps with bilateral rails and min@.  Therapy Documentation Precautions:  Precautions Precautions: Fall Precaution Comments: DVT in right UE- restricted  Restrictions Weight Bearing Restrictions: No Pain: Pain Assessment Pain Assessment: No/denies pain Pain Score: 0-No painC/o feet being sore.  See FIM for current functional status  Therapy/Group: Individual Therapy  [name] 05/17/2012, 12:19 PM

## 2012-05-17 NOTE — Progress Notes (Signed)
Physical Therapy Session Note  Patient Details  Name: Wayne Buckley MRN: [id number] Date of Birth: [birth year]  Today's Date: 05/17/2012 Time: 1300-1400 Time Calculation (min): 60 min  Short Term Goals: Week 1:  PT Short Term Goal 1 (Week 1): Pt will perform functional transfers min A PT Short Term Goal 1 - Progress (Week 1): Met PT Short Term Goal 2 (Week 1): Pt will demo intellectual awareness with min A PT Short Term Goal 2 - Progress (Week 1): Met PT Short Term Goal 3 (Week 1): Pt will gait with min A 50' in controlled environment PT Short Term Goal 3 - Progress (Week 1): Met Week 2:  PT Short Term Goal 1 (Week 2): Pt will gait in home environment with min A 25' PT Short Term Goal 2 (Week 2): Pt will demo intellectual awareness with supervision cuing  PT Short Term Goal 3 (Week 2): Pt will demo dynamic standing balance with min A for functional task  Skilled Therapeutic Interventions/Progress Updates:   Patient asleep; easily aroused.  Performed supine > sit EOB with supervision and verbal cues to initiate.  Patient reporting need to use toilet.  Performed bed > toilet > sink > w/c transfer with HHA and min A for balance in standing without UE support during toileting tasks (clothing doffing, donning, hygiene) and min A during standing at sink to wash hands.  Discussed with patient transportation options upon D/C; patient feels he will be able to drive upon D/C but believes that his MD will not allow him to drive and that his wife will be driving him in their CRV.  When cued to transfer from w/c > simulated CRV patient appropriately approached passenger side door and opened door and entered car placing L foot in first and then sitting with min A for balance while standing on RLE.  Patient appropriately closed door and buckled seat belt. Discussed with patient a safer option for entering the car secondary to impaired standing balance; patient verbalized that sitting first and then placing LE  into car would be better.  Second repetition patient performed car transfer sitting on seat first and then placing LE into car.  Discussed home entry with patient; reports 2 stairs to porch with 2 rails.  Performed stair negotiation up and down 2 stairs (6") with two rails x 3 reps with first rep performing with step to technique and min A progressing to alternating sequence with min A; discussed with patient how he would perform if he needed to carry an object in one hand while entering his house.  Practiced dual task on stairs with patient holding 4lb weight in LUE and rail with RUE up and down 2 stairs x 3 reps beginning with step to sequence and then progressing to alternating sequence with min A.  Continued NMR for hip stabilization training in hallway with bilat UE support on wall rail side stepping to L and R against resistance for closed chain hip ABD sustained activation with verbal and tactile cues to maintain upright trunk posture and hip IR during ABD x 25' each direction.  Therapy Documentation Precautions:  Precautions Precautions: Fall Precaution Comments: DVT in right UE- restricted  Restrictions Weight Bearing Restrictions: No Pain: Pain Assessment Pain Assessment: No/denies pain Locomotion : Ambulation Ambulation/Gait Assistance: 4: Min assist    See FIM for current functional status  Therapy/Group: Individual Therapy  Edman [address] [facility] 05/17/2012, 3:26 PM

## 2012-05-17 NOTE — Progress Notes (Signed)
Occupational Therapy Session Note  Patient Details  Name: Wayne Buckley MRN: [id number] Date of Birth: 04-Aug-1950  Today's Date: 05/17/2012 Time: 1100-1155 Time Calculation (min): 55 min  Short Term Goals: Week 2:  OT Short Term Goal 1 (Week 2): Pt will demonstrate intellecual awareness in functional ADL tasks with mod A OT Short Term Goal 2 (Week 2): Pt would alert staff of toileting needs with min A to be contient OT Short Term Goal 3 (Week 2): Pt will don LB clothing with only steady A OT Short Term Goal 4 (Week 2): Pt will perform sit to stands with supervision with min cuing for hand placement   Skilled Therapeutic Interventions/Progress Updates:    Pt engaged in bathing at shower level and dressing with sit<>stand from w/c.  Pt required steady A when standing but completed all tasks with supervision while seated.  Pt transitioned to gym for functional ambulation with HHA to complete home mgmt tasks.  Pt stated that he believed his balance was getting better and he was encouraged by his progress.  Focus on safety awareness, dynamic standing balance, functional ambulation, and activity tolerance.   Therapy Documentation Precautions:  Precautions Precautions: Fall Precaution Comments: DVT in right UE- restricted  Restrictions Weight Bearing Restrictions: No   Pain: Pain Assessment Pain Assessment: No/denies pain Pain Score: 0-No pain  See FIM for current functional status  Therapy/Group: Individual Therapy  Rich Brave 05/17/2012, 12:02 PM

## 2012-05-17 NOTE — Progress Notes (Signed)
Subjective/Complaints:   A 12 point review of systems has been performed and if not noted above is otherwise negative.   Objective: Vital Signs: Blood pressure 141/87, pulse 77, temperature 98.3 F (36.8 C), temperature source Oral, resp. rate 18, weight 77 kg (169 lb 12.1 oz), SpO2 99.00%. No results found.  Basename 05/15/12 0614  WBC 8.9  HGB 10.9*  HCT 33.4*  PLT 684*    Basename 05/15/12 0614  NA 138  K 4.1  CL 103  CO2 27  GLUCOSE 96  BUN 15  CREATININE 0.62  CALCIUM 9.4   CBG (last 3)  No results found for this basename: GLUCAP:3 in the last 72 hours  Wt Readings from Last 3 Encounters:  05/15/12 77 kg (169 lb 12.1 oz)  05/04/12 83.9 kg (184 lb 15.5 oz)  05/04/12 83.9 kg (184 lb 15.5 oz)    Physical Exam:  Constitutional: He appears well-developed and well-nourished.  HENT:  Head: Normocephalic.  Left crani incision clean and dry with staples in place.   Scab centrally Eyes: Pupils are equal, round, and reactive to light.  Neck: Normal range of motion.  Cardiovascular: Normal rate and regular rhythm.  Pulmonary/Chest: Effort normal and breath sounds normal.  Abdominal: Soft. Bowel sounds are normal.  Musculoskeletal: He exhibits no edema.  Neurological: He is alert.  Oriented to self and place today. Answers simple questions. Limited insight and and awareness.   Right sided weakness UE>LE--?motor apraxia RUE is grossly 3+ to4/5. RLE is  3+/5 prox to 4/5 distally. Pain sense better on right. Speech more automatic. Able to carry simple conversation.   Psychiatric:    calm, collected    Assessment/Plan: 1. 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 Physiatrist is providing close team supervision and 24 hour management of active medical problems listed below. Physiatrist and rehab team continue to assess barriers to  discharge/monitor patient progress toward functional and medical goals.   SR x 3 without issues    FIM: FIM - Bathing Bathing Steps Patient Completed: Chest;Right Arm;Left Arm;Abdomen;Front perineal area;Buttocks;Right upper leg;Left upper leg;Left lower leg (including foot);Right lower leg (including foot) Bathing: 4: Steadying assist  FIM - Upper Body Dressing/Undressing Upper body dressing/undressing steps patient completed: Thread/unthread right sleeve of pullover shirt/dresss;Thread/unthread left sleeve of pullover shirt/dress;Put head through opening of pull over shirt/dress;Pull shirt over trunk Upper body dressing/undressing: 5: Supervision: Safety issues/verbal cues FIM - Lower Body Dressing/Undressing Lower body dressing/undressing steps patient completed: Thread/unthread right pants leg;Thread/unthread left pants leg;Pull pants up/down;Don/Doff right shoe;Don/Doff left sock;Don/Doff right sock;Don/Doff left shoe Lower body dressing/undressing: 4: Steadying Assist  FIM - Toileting Toileting steps completed by patient: Adjust clothing prior to toileting;Adjust clothing after toileting Toileting Assistive Devices: Grab bar or rail for support Toileting: 3: Mod-Patient completed 2 of 3 steps  FIM - Diplomatic Services operational officer Devices: Grab bars Toilet Transfers: 4-From toilet/BSC: Min A (steadying Pt. > 75%);4-To toilet/BSC: Min A (steadying Pt. > 75%)  FIM - Bed/Chair Transfer Bed/Chair Transfer Assistive Devices: Bed rails Bed/Chair Transfer: 4: Chair or W/C > Bed: Min A (steadying Pt. > 75%);4: Bed > Chair or W/C: Min A (steadying Pt. > 75%);5: Sit > Supine: Supervision (verbal cues/safety issues)  FIM - Locomotion: Wheelchair Distance: 45' Locomotion: Wheelchair: 5: Travels 150 ft or more: maneuvers on rugs and over door sills with supervision, cueing or coaxing FIM - Locomotion: Ambulation Locomotion: Ambulation  Assistive Devices: Dealer Ambulation/Gait Assistance: 3: Mod assist Locomotion: Ambulation: 2: Travels 50 - 149 ft with minimal assistance (Pt.>75%)  Comprehension Comprehension Mode: Auditory Comprehension: 4-Understands basic 75 - 89% of the time/requires cueing 10 - 24% of the time  Expression Expression Mode: Verbal Expression: 4-Expresses basic 75 - 89% of the time/requires cueing 10 - 24% of the time. Needs helper to occlude trach/needs to repeat words.  Social Interaction Social Interaction: 4-Interacts appropriately 75 - 89% of the time - Needs redirection for appropriate language or to initiate interaction.  Problem Solving Problem Solving: 4-Solves basic 75 - 89% of the time/requires cueing 10 - 24% of the time  Memory Memory: 4-Recognizes or recalls 75 - 89% of the time/requires cueing 10 - 24% of the time  Medical Problem List and Plan:  1. DVT Prophylaxis/Anticoagulation: Mechanical: Sequential compression devices, below knee Bilateral lower extremities  2. Pain Management: denies any pain.  3. Mood: no signs of distress. Patient with significant cognitive deficits with poor awareness and impaired insight.  4. Neuropsych: This patient is not capable of making decisions on his/her own behalf.  -neuropsych eval and input appreciated  5. ABLA:improved 10.9--follow up later this week   6. RUE DVT: no blood thinner for now per NS.  7. Polysubstance abuse: outpt follow up 8. HTN: off BP meds at current time. Monitor with bid checks for trends.  9. Urine retention/neurogenic bladder: generally continent 10. FEN: eating well.    LOS (Days) 10 A FACE TO FACE EVALUATION WAS PERFORMED  Ruddy Swire T 05/17/2012, 7:39 AM

## 2012-05-17 NOTE — Progress Notes (Signed)
Speech Language Pathology Daily Session Note  Patient Details  Name: Wayne Buckley MRN: [id number] Date of Birth: [birth year]  Today's Date: 05/17/2012 Time: 0900-1000 Time Calculation (min): 60 min  Short Term Goals: Week 2: SLP Short Term Goal 1 (Week 2): Pt will utilize call bell to express wants/needs with Max A multimodal cueing SLP Short Term Goal 2 (Week 2): Pt will consume current diet with Min A verbal cues for utilization of swallowing compensatory strategies SLP Short Term Goal 3 (Week 2): Pt will demonstrate selective attention to a functional task in a mildly distracting enviornment for ~30 minutes with Min verbal cues for redirection  SLP Short Term Goal 4 (Week 2): Pt will consume trails of Dys. 3 textures and demonstrate efficient mastication with Mod verbal cues without overt s/s of aspiration  SLP Short Term Goal 5 (Week 2): Pt will demonstrate functional problem solving for functional tasks with supervision verbal cues.   Skilled Therapeutic Interventions: Treatment focus on cognitive and language goals. Pt participated in a new, mildly complex task with focus on working memory with utilization of compensatory strategies and verbal expression. Pt required Max semantic and question cues to utilize memory compensatory strategies and Mod semantic cues to increase recall. Pt also required extra time and Mod question cues to generate items within a specific category.    FIM:  Comprehension Comprehension Mode: Auditory Comprehension: 4-Understands basic 75 - 89% of the time/requires cueing 10 - 24% of the time Expression Expression Mode: Verbal Expression: 4-Expresses basic 75 - 89% of the time/requires cueing 10 - 24% of the time. Needs helper to occlude trach/needs to repeat words. Social Interaction Social Interaction: 4-Interacts appropriately 75 - 89% of the time - Needs redirection for appropriate language or to initiate interaction. Problem Solving Problem Solving:  4-Solves basic 75 - 89% of the time/requires cueing 10 - 24% of the time Memory Memory: 3-Recognizes or recalls 50 - 74% of the time/requires cueing 25 - 49% of the time  Pain Pain Assessment Pain Assessment: No/denies pain  Therapy/Group: Individual Therapy  [name] 05/17/2012, 3:55 PM

## 2012-05-17 NOTE — Progress Notes (Addendum)
Physical Therapy Session Note  Patient Details  Name: [name] MRN: [id number] Date of Birth: 29-Jul-1950  Today's Date: 05/17/2012 Time: 1430-1530 Time Calculation (min): 60 min  Short Term Goals: Week 2:  PT Short Term Goal 1 (Week 2): Pt will gait in home environment with min A 25' PT Short Term Goal 2 (Week 2): Pt will demo intellectual awareness with supervision cuing  PT Short Term Goal 3 (Week 2): Pt will demo dynamic standing balance with min A for functional task  Skilled Therapeutic Interventions/Progress Updates:    Gait training addressing side stepping and backwards with bilateral HHA, mod@ with LOB posteriorly.  Dynamic balance training on the Wii with RW for support with min@.  Foam balance beam balance training performing UE theraband exercises with min to mod @ and focusing on foot position pointed forward.  Therapy Documentation Precautions:  Precautions Precautions: Fall Precaution Comments: DVT in right UE- restricted  Restrictions Weight Bearing Restrictions: No Pain: Pain Assessment Pain Assessment: No/denies pain See FIM for current functional status  Therapy/Group: Individual Therapy  [name] 05/17/2012, 3:56 PM

## 2012-05-18 NOTE — Progress Notes (Signed)
Subjective/Complaints:  No complaints today A 12 point review of systems has been performed and if not noted above is otherwise negative.   Objective: Vital Signs: Blood pressure 147/91, pulse 79, temperature 97.7 F (36.5 C), temperature source Oral, resp. rate 19, weight 169 lb 12.1 oz (77 kg), SpO2 97.00%. No results found. No results found for this basename: WBC:2,HGB:2,HCT:2,PLT:2 in the last 72 hours No results found for this basename: NA:2,K:2,CL:2,CO2:2,GLUCOSE:2,BUN:2,CREATININE:2,CALCIUM:2 in the last 72 hours CBG (last 3)  No results found for this basename: GLUCAP:3 in the last 72 hours  Wt Readings from Last 3 Encounters:  05/15/12 169 lb 12.1 oz (77 kg)  05/04/12 184 lb 15.5 oz (83.9 kg)  05/04/12 184 lb 15.5 oz (83.9 kg)    Physical Exam:  Constitutional: He appears well-developed and well-nourished.  HENT:  Head: Normocephalic.  Left crani incision clean and dry with staples in place.   Scab centrally Eyes: Pupils are equal, round, and reactive to light.  Neck: Normal range of motion.  Cardiovascular: Normal rate and regular rhythm.  Pulmonary/Chest: Effort normal and breath sounds normal.  Abdominal: Soft. Bowel sounds are normal.  Musculoskeletal: He exhibits no edema.  Neurological: He is alert.  Oriented to self and place today. Answers simple questions. Limited insight and and awareness.   Right sided weakness UE>LE--?motor apraxia RUE is grossly 3+ to4/5. RLE is  3+/5 prox to 4/5 distally. Pain sense better on right. Speech more automatic. Able to carry simple conversation.   Psychiatric:    calm, collected    Assessment/Plan: 1. 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 Physiatrist is providing close team supervision and 24 hour management of active medical problems listed below. Physiatrist and rehab team continue to  assess barriers to discharge/monitor patient progress toward functional and medical goals.   SR x 3 without issues    FIM: FIM - Bathing Bathing Steps Patient Completed: Chest;Right Arm;Left Arm;Abdomen;Front perineal area;Buttocks;Right upper leg;Left upper leg;Left lower leg (including foot);Right lower leg (including foot) Bathing: 4: Steadying assist  FIM - Upper Body Dressing/Undressing Upper body dressing/undressing steps patient completed: Thread/unthread right sleeve of pullover shirt/dresss;Thread/unthread left sleeve of pullover shirt/dress;Put head through opening of pull over shirt/dress;Pull shirt over trunk Upper body dressing/undressing: 5: Supervision: Safety issues/verbal cues FIM - Lower Body Dressing/Undressing Lower body dressing/undressing steps patient completed: Thread/unthread right pants leg;Thread/unthread left pants leg;Pull pants up/down;Don/Doff right shoe;Don/Doff left sock;Don/Doff right sock;Fasten/unfasten pants;Don/Doff left shoe Lower body dressing/undressing: 4: Steadying Assist  FIM - Toileting Toileting steps completed by patient: Adjust clothing prior to toileting;Adjust clothing after toileting;Performs perineal hygiene Toileting Assistive Devices: Grab bar or rail for support Toileting: 4: Steadying assist  FIM - Diplomatic Services operational officer Devices: Grab bars Toilet Transfers: 4-To toilet/BSC: Min A (steadying Pt. > 75%);4-From toilet/BSC: Min A (steadying Pt. > 75%)  FIM - Bed/Chair Transfer Bed/Chair Transfer Assistive Devices: Arm rests Bed/Chair Transfer: 5: Supine > Sit: Supervision (verbal cues/safety issues);4: Bed > Chair or W/C: Min A (steadying Pt. > 75%);4: Chair or W/C > Bed: Min A (steadying Pt. > 75%)  FIM - Locomotion: Wheelchair Distance: 45' Locomotion: Wheelchair: 1: Total Assistance/staff pushes wheelchair (Pt<25%) FIM - Locomotion: Ambulation Locomotion: Ambulation Assistive Devices: Walker - Rolling;Other  (comment) (shopping cart) Ambulation/Gait Assistance: 4: Min assist Locomotion: Ambulation: 1: Travels less than 50 ft with minimal assistance (Pt.>75%)  Comprehension Comprehension Mode: Auditory Comprehension: 4-Understands basic 75 -  89% of the time/requires cueing 10 - 24% of the time  Expression Expression Mode: Verbal Expression: 4-Expresses basic 75 - 89% of the time/requires cueing 10 - 24% of the time. Needs helper to occlude trach/needs to repeat words.  Social Interaction Social Interaction: 4-Interacts appropriately 75 - 89% of the time - Needs redirection for appropriate language or to initiate interaction.  Problem Solving Problem Solving: 4-Solves basic 75 - 89% of the time/requires cueing 10 - 24% of the time  Memory Memory: 3-Recognizes or recalls 50 - 74% of the time/requires cueing 25 - 49% of the time  Medical Problem List and Plan:  1. DVT Prophylaxis/Anticoagulation: Mechanical: Sequential compression devices, below knee Bilateral lower extremities  2. Pain Management: denies any pain.  3. Mood: no signs of distress. Patient with significant cognitive deficits with poor awareness and impaired insight.  4. Neuropsych: This patient is not capable of making decisions on his/her own behalf.  -neuropsych eval and input appreciated  5. ABLA:improved 10.9--follow up later this week   6. RUE DVT: no blood thinner for now per NS.  7. Polysubstance abuse: outpt follow up 8. HTN: off BP meds at current time. Monitor with bid checks for trends.  9. Urine retention/neurogenic bladder: generally continent 10. FEN: eating well.    LOS (Days) 11 A FACE TO FACE EVALUATION WAS PERFORMED  Wayne Buckley 05/18/2012, 9:19 AM

## 2012-05-19 NOTE — Progress Notes (Signed)
Subjective/Complaints:  No complaints today. He slept well. A 12 point review of systems has been performed and if not noted above is otherwise negative.   Objective: Vital Signs: Blood pressure 146/95, pulse 78, temperature 98.2 F (36.8 C), temperature source Oral, resp. rate 18, weight 169 lb 12.1 oz (77 kg), SpO2 96.00%. No results found. No results found for this basename: WBC:2,HGB:2,HCT:2,PLT:2 in the last 72 hours No results found for this basename: NA:2,K:2,CL:2,CO2:2,GLUCOSE:2,BUN:2,CREATININE:2,CALCIUM:2 in the last 72 hours CBG (last 3)  No results found for this basename: GLUCAP:3 in the last 72 hours  Wt Readings from Last 3 Encounters:  05/15/12 169 lb 12.1 oz (77 kg)  05/04/12 184 lb 15.5 oz (83.9 kg)  05/04/12 184 lb 15.5 oz (83.9 kg)    Physical Exam:  Constitutional: He appears well-developed and well-nourished.  HENT:  Head: Normocephalic.  Left crani incision clean and dry with staples in place.   Scab centrally Eyes: Pupils are equal, round, and reactive to light.  Neck: Normal range of motion.  Cardiovascular: Normal rate and regular rhythm.  Pulmonary/Chest: Effort normal and breath sounds normal.  Abdominal: Soft. Bowel sounds are normal.  Musculoskeletal: He exhibits no edema.  Neurological: He is alert.  Oriented to self and place today. Answers simple questions. Limited insight and and awareness.   Right sided weakness UE>LE--?motor apraxia RUE is grossly 3+ to4/5. RLE is  3+/5 prox to 4/5 distally. Pain sense better on right. Speech more automatic. Able to carry simple conversation.   Psychiatric:    calm, collected    Assessment/Plan: 1. 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 Physiatrist is providing close team supervision and 24 hour management of active medical problems listed below. Physiatrist and rehab team  continue to assess barriers to discharge/monitor patient progress toward functional and medical goals.   SR x 3 without issues    FIM: FIM - Bathing Bathing Steps Patient Completed: Chest;Right Arm;Left Arm;Abdomen;Front perineal area;Buttocks;Right upper leg;Left upper leg;Left lower leg (including foot);Right lower leg (including foot) Bathing: 4: Steadying assist  FIM - Upper Body Dressing/Undressing Upper body dressing/undressing steps patient completed: Thread/unthread right sleeve of pullover shirt/dresss;Thread/unthread left sleeve of pullover shirt/dress;Put head through opening of pull over shirt/dress;Pull shirt over trunk Upper body dressing/undressing: 5: Supervision: Safety issues/verbal cues FIM - Lower Body Dressing/Undressing Lower body dressing/undressing steps patient completed: Thread/unthread right pants leg;Thread/unthread left pants leg;Pull pants up/down;Don/Doff right shoe;Don/Doff left sock;Don/Doff right sock;Fasten/unfasten pants;Don/Doff left shoe Lower body dressing/undressing: 4: Steadying Assist  FIM - Toileting Toileting steps completed by patient: Adjust clothing prior to toileting;Adjust clothing after toileting;Performs perineal hygiene Toileting Assistive Devices: Grab bar or rail for support Toileting: 4: Steadying assist  FIM - Diplomatic Services operational officer Devices: Grab bars Toilet Transfers: 4-To toilet/BSC: Min A (steadying Pt. > 75%);4-From toilet/BSC: Min A (steadying Pt. > 75%)  FIM - Bed/Chair Transfer Bed/Chair Transfer Assistive Devices: Arm rests Bed/Chair Transfer: 5: Supine > Sit: Supervision (verbal cues/safety issues);4: Bed > Chair or W/C: Min A (steadying Pt. > 75%);4: Chair or W/C > Bed: Min A (steadying Pt. > 75%)  FIM - Locomotion: Wheelchair Distance: 45' Locomotion: Wheelchair: 1: Total Assistance/staff pushes wheelchair (Pt<25%) FIM - Locomotion: Ambulation Locomotion: Ambulation Assistive Devices: Walker -  Rolling;Other (comment) Ambulation/Gait Assistance: 4: Min assist Locomotion: Ambulation: 1: Travels less than 50 ft with minimal assistance (Pt.>75%)  Comprehension Comprehension Mode: Auditory Comprehension: 4-Understands basic  75 - 89% of the time/requires cueing 10 - 24% of the time  Expression Expression Mode: Verbal Expression: 4-Expresses basic 75 - 89% of the time/requires cueing 10 - 24% of the time. Needs helper to occlude trach/needs to repeat words.  Social Interaction Social Interaction: 4-Interacts appropriately 75 - 89% of the time - Needs redirection for appropriate language or to initiate interaction.  Problem Solving Problem Solving: 4-Solves basic 75 - 89% of the time/requires cueing 10 - 24% of the time  Memory Memory: 3-Recognizes or recalls 50 - 74% of the time/requires cueing 25 - 49% of the time  Medical Problem List and Plan:  1. DVT Prophylaxis/Anticoagulation: Mechanical: Sequential compression devices, below knee Bilateral lower extremities  2. Pain Management: denies any pain.  3. Mood: no signs of distress. Patient with significant cognitive deficits with poor awareness and impaired insight.  4. Neuropsych: This patient is not capable of making decisions on his/her own behalf.  -neuropsych eval and input appreciated  5. ABLA:improved 10.9--follow up later this week   6. RUE DVT: no blood thinner for now per NS.  7. Polysubstance abuse: outpt follow up 8. HTN: off BP meds at current time. Monitor with bid checks for trends.  9. Urine retention/neurogenic bladder: generally continent 10. FEN: eating well.    LOS (Days) 12 A FACE TO FACE EVALUATION WAS PERFORMED  Alex Plotnikov 05/19/2012, 8:20 AM

## 2012-05-20 ENCOUNTER — Inpatient Hospital Stay (HOSPITAL_COMMUNITY): Payer: 59 | Admitting: Physical Therapy

## 2012-05-20 ENCOUNTER — Inpatient Hospital Stay (HOSPITAL_COMMUNITY): Payer: 59 | Admitting: Occupational Therapy

## 2012-05-20 ENCOUNTER — Encounter (HOSPITAL_COMMUNITY): Payer: 59 | Admitting: Occupational Therapy

## 2012-05-20 ENCOUNTER — Inpatient Hospital Stay (HOSPITAL_COMMUNITY): Payer: 59 | Admitting: Speech Pathology

## 2012-05-20 DIAGNOSIS — F1010 Alcohol abuse, uncomplicated: Secondary | ICD-10-CM

## 2012-05-20 DIAGNOSIS — W19XXXA Unspecified fall, initial encounter: Secondary | ICD-10-CM

## 2012-05-20 DIAGNOSIS — S069X9A Unspecified intracranial injury with loss of consciousness of unspecified duration, initial encounter: Secondary | ICD-10-CM

## 2012-05-20 NOTE — Progress Notes (Signed)
Occupational Therapy Session Note  Patient Details  Name: Wayne Buckley MRN: [id number] Date of Birth: 1950/10/12  Today's Date: 05/20/2012 Time: 0900-0950 Time Calculation (min): 50 min  Short Term Goals: Week 2:  OT Short Term Goal 1 (Week 2): Pt will demonstrate intellecual awareness in functional ADL tasks with mod A OT Short Term Goal 2 (Week 2): Pt would alert staff of toileting needs with min A to be contient OT Short Term Goal 3 (Week 2): Pt will don LB clothing with only steady A OT Short Term Goal 4 (Week 2): Pt will perform sit to stands with supervision with min cuing for hand placement   Skilled Therapeutic Interventions/Progress Updates:  Patient resting in w/c with QRB in place and wife at his side upon arrival.  When asked if patient need to complete any task in his room before we leave he stated no he did not.  Ambulated with RW and close supervision-min assist to day room with carpeted floor.  Engaged in table top activity with focus on activity tolerance, walker safety, dynamic standing balance & standing endurance, coordination, sustained attention, memory and intellectual awareness.  Patient often making excuses when activity became challenging often referring to his life roles before hospital stay.  Patient's response to the question, "what is the most challenging task for you in the last week or so?"  Patient stated, "trying to convince everyone that I'm all right."  Redirected patient to review deficits with max cues in terms of intellectual awareness.  Patient able to demonstrate new learning with and without min cues by end of the session.  Precautions:  Precautions Precautions: Fall Precaution Comments: DVT in right UE- restricted  Restrictions Weight Bearing Restrictions: No Pain: Pain Assessment Pain Assessment: No/denies pain Pain Score: 0-No pain ADL:  See FIM for current functional status  Therapy/Group: Individual Therapy  Helton Oleson 05/20/2012,  10:43 AM

## 2012-05-20 NOTE — Progress Notes (Signed)
Speech Language Pathology Daily Session Note  Patient Details  Name: Wayne Buckley MRN: [id number] Date of Birth: Aug 28, 1950  Today's Date: 05/20/2012 Time: 1010-1100 Time Calculation (min): 50 min  Short Term Goals: Week 2: SLP Short Term Goal 1 (Week 2): Pt will utilize call bell to express wants/needs with Max A multimodal cueing SLP Short Term Goal 1 - Progress (Week 2): Progressing toward goal SLP Short Term Goal 2 (Week 2): Pt will consume current diet with Min A verbal cues for utilization of swallowing compensatory strategies SLP Short Term Goal 2 - Progress (Week 2): Progressing toward goal SLP Short Term Goal 3 (Week 2): Pt will demonstrate selective attention to a functional task in a mildly distracting enviornment for ~30 minutes with Min verbal cues for redirection  SLP Short Term Goal 3 - Progress (Week 2): Progressing toward goal SLP Short Term Goal 4 (Week 2): Pt will consume trails of Dys. 3 textures and demonstrate efficient mastication with Mod verbal cues without overt s/s of aspiration  SLP Short Term Goal 4 - Progress (Week 2): Progressing toward goal SLP Short Term Goal 5 (Week 2): Pt will demonstrate functional problem solving for functional tasks with supervision verbal cues.  SLP Short Term Goal 5 - Progress (Week 2): Progressing toward goal  Skilled Therapeutic Interventions: Treatment focused on cognitive and swallowing goals. Pt verbalized limited awareness of therapy focus since admit. Pt reported he didn't know why his therapists were making him do the things they were doing. Discussed activities/tasks completed in each therapy, reviewed rationale, and provided written cues. Recall and problem solving of therapy focus was completed with pts room door open. Hallway had minimal activity going on, but pt was facing the hall.  Able to attend to SLP and task without cues to redirect.  Pt was given trials of dys 3 consistency (graham crackers with peanut butter, and milk).  Wet voice quality noted x2, which pt cleared with independent throat clear.    FIM:  Comprehension Comprehension Mode: Auditory Comprehension: 5-Follows basic conversation/direction: With no assist Expression Expression Mode: Verbal Expression: 5-Expresses basic needs/ideas: With extra time/assistive device Social Interaction Social Interaction: 5-Interacts appropriately 90% of the time - Needs monitoring or encouragement for participation or interaction. Problem Solving Problem Solving: 5-Solves basic 90% of the time/requires cueing < 10% of the time Memory Memory: 4-Recognizes or recalls 75 - 89% of the time/requires cueing 10 - 24% of the time FIM - Eating Eating Activity: 5: Supervision/cues  Pain Pain Assessment Pain Assessment: No/denies pain Pain Score: 0-No pain  Therapy/Group: Individual Therapy  Makeda Peeks B. [place], MSP, CCC-SLP   Leigh Aurora 05/20/2012, 12:43 PM

## 2012-05-20 NOTE — Progress Notes (Signed)
Occupational Therapy daily and Weekly Progress Note  Patient Details  Name: Wayne Buckley MRN: [id number] Date of Birth: July 01, 1950  Today's Date: 05/20/2012 Time: 0730-0815 Time Calculation (min): 45 min  1:1 self care retraining including showering, functional ambulation with and without RW, sit to stand with proper hand placement, dressing, sequencing, task organization, selective attention, orientation questions, simple problem solving, working memory. Pt able to pick out 2 requested clothing items from dresser with only 1 cue. Pt required min to mod cuing in shower for task organization and thoroughness.  Self fed breakfast with increased ability to pace self and not over stuff mouth; requiring less cuing.  Pt still requires max cues for RW safety.   Patient has met 3 of 4 short term goals.  Pt continues to make good progress towards LTG. Pt can shower sitting on seat with steady A with standing, UB/LB dressing with steadying, functional ambulation with RW with steady A, toileting, steady A and grooming with setup at sink. Pt has been continent of bowel and bladder during the day; reporting to staff of toileting needs. (pt has been incontinent of urine at night some times.) Family education has not been completed yet. Pt demonstrates behavior consistent with [place] Level VII.   Patient continues to demonstrate the following deficits:muscle weakness and muscle joint tightness, impaired timing and sequencing, decreased coordination and decreased motor planning, decreased attention, decreased awareness, decreased problem solving, decreased safety awareness, decreased memory and delayed processing and decreased standing balance, decreased balance strategies and difficulty maintaining precautions and therefore will continue to benefit from skilled OT intervention to enhance overall performance with BADL.  Patient progressing toward long term goals..  Continue plan of care.   OT Short Term Goals Week  2:  OT Short Term Goal 1 (Week 2): Pt will demonstrate intellecual awareness in functional ADL tasks with mod A OT Short Term Goal 1 - Progress (Week 2): Met OT Short Term Goal 2 (Week 2): Pt would alert staff of toileting needs with min A to be contient OT Short Term Goal 2 - Progress (Week 2): Partly met OT Short Term Goal 3 (Week 2): Pt will don LB clothing with only steady A OT Short Term Goal 3 - Progress (Week 2): Met OT Short Term Goal 4 (Week 2): Pt will perform sit to stands with supervision with min cuing for hand placement  OT Short Term Goal 4 - Progress (Week 2): Met Week 3:  OT Short Term Goal 1 (Week 3): Family education with wife will be completed  OT Short Term Goal 2 (Week 3): STG=LTG  Skilled Therapeutic Interventions/Progress Updates:    continue POC  Therapy Documentation Precautions:  Precautions Precautions: Fall Precaution Comments: DVT in right UE- restricted  Restrictions Weight Bearing Restrictions: No Pain: Pain Assessment Pain Assessment: No/denies pain Pain Score: 0-No pain  See FIM for current functional status  Therapy/Group: Individual Therapy  Roney Mans [facility] 05/20/2012, 11:02 AM

## 2012-05-20 NOTE — Progress Notes (Signed)
Subjective/Complaints:  good night. No complaints A 12 point review of systems has been performed and if not noted above is otherwise negative.   Objective: Vital Signs: Blood pressure 147/92, pulse 81, temperature 97.5 F (36.4 C), temperature source Oral, resp. rate 19, weight 77 kg (169 lb 12.1 oz), SpO2 95.00%. No results found. No results found for this basename: WBC:2,HGB:2,HCT:2,PLT:2 in the last 72 hours No results found for this basename: NA:2,K:2,CL:2,CO2:2,GLUCOSE:2,BUN:2,CREATININE:2,CALCIUM:2 in the last 72 hours CBG (last 3)  No results found for this basename: GLUCAP:3 in the last 72 hours  Wt Readings from Last 3 Encounters:  05/15/12 77 kg (169 lb 12.1 oz)  05/04/12 83.9 kg (184 lb 15.5 oz)  05/04/12 83.9 kg (184 lb 15.5 oz)    Physical Exam:  Constitutional: He appears well-developed and well-nourished.  HENT:  Head: Normocephalic.  Left crani incision clean and dry with staples in place.   Scab centrally still Eyes: Pupils are equal, round, and reactive to light.  Neck: Normal range of motion.  Cardiovascular: Normal rate and regular rhythm.  Pulmonary/Chest: Effort normal and breath sounds normal.  Abdominal: Soft. Bowel sounds are normal.  Musculoskeletal: He exhibits no edema.  Neurological: He is alert.  Oriented to self and place today. Answers simple questions. Limited insight and and awareness.   Right sided weakness UE>LE--?motor apraxia RUE is  4/5. RLE is  Also 4/5.  Pain sense better on right. Speech more automatic. --identified multiple objects for me. Able to carry simple conversation. Some receptive and expressive deficits still but much improved.   Psychiatric:    calm, collected    Assessment/Plan: 1. 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 Physiatrist is providing close team supervision and 24 hour  management of active medical problems listed below. Physiatrist and rehab team continue to assess barriers to discharge/monitor patient progress toward functional and medical goals.   Remove staples today.    FIM: FIM - Bathing Bathing Steps Patient Completed: Chest;Right Arm;Left Arm;Abdomen;Front perineal area;Buttocks;Right upper leg;Left upper leg;Left lower leg (including foot);Right lower leg (including foot) Bathing: 4: Steadying assist  FIM - Upper Body Dressing/Undressing Upper body dressing/undressing steps patient completed: Thread/unthread right sleeve of pullover shirt/dresss;Thread/unthread left sleeve of pullover shirt/dress;Put head through opening of pull over shirt/dress;Pull shirt over trunk Upper body dressing/undressing: 5: Supervision: Safety issues/verbal cues FIM - Lower Body Dressing/Undressing Lower body dressing/undressing steps patient completed: Thread/unthread right pants leg;Thread/unthread left pants leg;Pull pants up/down;Don/Doff right shoe;Don/Doff left sock;Don/Doff right sock;Fasten/unfasten pants;Don/Doff left shoe Lower body dressing/undressing: 4: Steadying Assist  FIM - Toileting Toileting steps completed by patient: Adjust clothing prior to toileting;Adjust clothing after toileting;Performs perineal hygiene Toileting Assistive Devices: Grab bar or rail for support Toileting: 4: Steadying assist  FIM - Diplomatic Services operational officer Devices: Grab bars Toilet Transfers: 4-To toilet/BSC: Min A (steadying Pt. > 75%);4-From toilet/BSC: Min A (steadying Pt. > 75%)  FIM - Bed/Chair Transfer Bed/Chair Transfer Assistive Devices: Arm rests Bed/Chair Transfer: 5: Supine > Sit: Supervision (verbal cues/safety issues);4: Bed > Chair or W/C: Min A (steadying Pt. > 75%);4: Chair or W/C > Bed: Min A (steadying Pt. > 75%)  FIM - Locomotion: Wheelchair Distance: 45' Locomotion: Wheelchair: 1: Total Assistance/staff pushes wheelchair  (Pt<25%) FIM - Locomotion: Ambulation Locomotion: Ambulation Assistive Devices: Walker - Rolling;Other (comment) Ambulation/Gait Assistance: 4: Min assist Locomotion: Ambulation: 1: Travels less than 50 ft with minimal assistance (Pt.>75%)  Comprehension Comprehension Mode: Auditory Comprehension: 4-Understands basic 75 - 89% of the time/requires cueing 10 - 24% of the time  Expression Expression Mode: Verbal Expression: 4-Expresses basic 75 - 89% of the time/requires cueing 10 - 24% of the time. Needs helper to occlude trach/needs to repeat words.  Social Interaction Social Interaction: 4-Interacts appropriately 75 - 89% of the time - Needs redirection for appropriate language or to initiate interaction.  Problem Solving Problem Solving: 4-Solves basic 75 - 89% of the time/requires cueing 10 - 24% of the time  Memory Memory: 3-Recognizes or recalls 50 - 74% of the time/requires cueing 25 - 49% of the time  Medical Problem List and Plan:  1. DVT Prophylaxis/Anticoagulation: Mechanical: Sequential compression devices, below knee Bilateral lower extremities  2. Pain Management: denies any pain.  3. Mood: no signs of distress. Patient with significant cognitive deficits with poor awareness and impaired insight.  4. Neuropsych: This patient is not capable of making decisions on his/her own behalf.  -neuropsych eval and input appreciated  5. ABLA:improved 10.9--follow up later this week   6. RUE DVT: no blood thinner for now per NS.  7. Polysubstance abuse: outpt follow up 8. HTN: off BP meds at current time. Monitor with bid checks for trends.  9. Urine retention/neurogenic bladder: generally continent 10. FEN: eating well.    LOS (Days) 13 A FACE TO FACE EVALUATION WAS PERFORMED  Virl Coble T 05/20/2012, 7:16 AM

## 2012-05-20 NOTE — Progress Notes (Signed)
Physical Therapy Note  Patient Details  Name: Wayne Buckley MRN: [id number] Date of Birth: Oct 08, 1950 Today's Date: 05/20/2012  1130-1155 (25 minutes) individual Pain: no reported pain Focus of treatment: Therapeutic activity focused on dynamic standing balance Treatment: Reviewed list of PT items on door that pt is working on in PT to determine what the patient feels is a problem . Pt states that balance is still a problem  Kinetron in standing performing weight shifts with/without UE assist (min assist for balance without use of UEs) 3 X 20 reps.  1400- 1425 (25 minutes) individual Pain: no reported pain Focus of treatment: gait training Treatment: Gait with RW min assist . Pt requires occasional vcs for safety with AD ( staying closer, staying within AD during direction changes); gait without AD min/mod assist for balance with stagger side to side; alternate standing with foot on 6 inch step 2 X 2 minutes with moderate sway .     1530- 1625 (55 minutes) individual Pain: no reported pain Focus of treatment:  Treatment: gait training; standing balance /tolerance Treatment: gait to/from room 120 feet RW min assist with mod vcs for safe use of AD ; Standing balance/tolerance- Wii bowling - pt performed game one ( 12 minutes without AD) with mod cues for use of controller and intermittent min assist for balance/ min sway; game 2  (10 minutes) standing on soft (foam) surface with min cues for controller and min/mod sway with assist for loss of balance X 2; game 3 (firm surface) with only intermittent vcs for controller and min sway. Pt required max assist X 2 to prevent fall when walking to chair (pt states his knees gave out due to back spasm).    Sylvan Sookdeo,JIM 05/20/2012, 7:23 AM

## 2012-05-21 ENCOUNTER — Inpatient Hospital Stay (HOSPITAL_COMMUNITY): Payer: 59 | Admitting: Speech Pathology

## 2012-05-21 ENCOUNTER — Inpatient Hospital Stay (HOSPITAL_COMMUNITY): Payer: 59 | Admitting: Occupational Therapy

## 2012-05-21 ENCOUNTER — Ambulatory Visit (HOSPITAL_COMMUNITY): Payer: 59 | Admitting: Occupational Therapy

## 2012-05-21 ENCOUNTER — Encounter (HOSPITAL_COMMUNITY): Payer: 59 | Admitting: Occupational Therapy

## 2012-05-21 ENCOUNTER — Inpatient Hospital Stay (HOSPITAL_COMMUNITY): Payer: 59 | Admitting: Physical Therapy

## 2012-05-21 DIAGNOSIS — W19XXXA Unspecified fall, initial encounter: Secondary | ICD-10-CM

## 2012-05-21 DIAGNOSIS — S069X9A Unspecified intracranial injury with loss of consciousness of unspecified duration, initial encounter: Secondary | ICD-10-CM

## 2012-05-21 DIAGNOSIS — F1010 Alcohol abuse, uncomplicated: Secondary | ICD-10-CM

## 2012-05-21 NOTE — Progress Notes (Signed)
Physical Therapy Note  Patient Details  Name: Wayne Buckley MRN: [id number] Date of Birth: July 04, 1950 Today's Date: 05/21/2012  Time In:  11:30  Time Out:  12:00. 15 minute OT charge for 30 minute group co-led by OT and ST.  Patient participated in a self feeding group with emphasis on small bites, small sips, decreasing rate of intake, patient education, decreasing impulsivity.  Patient making excellent gains and only required 1-2 vc's.   Norton Pastel 05/21/2012, 6:07 PM

## 2012-05-21 NOTE — Progress Notes (Signed)
Occupational Therapy Session Note  Patient Details  Name: Wayne Buckley MRN: [id number] Date of Birth: [birth year]  Today's Date: 05/21/2012 Time: 0930-1015 Time Calculation (min): 45 min  Short Term Goals: Week 2:  OT Short Term Goal 1 (Week 2): Pt will demonstrate intellecual awareness in functional ADL tasks with mod A OT Short Term Goal 1 - Progress (Week 2): Met OT Short Term Goal 2 (Week 2): Pt would alert staff of toileting needs with min A to be contient OT Short Term Goal 2 - Progress (Week 2): Partly met OT Short Term Goal 3 (Week 2): Pt will don LB clothing with only steady A OT Short Term Goal 3 - Progress (Week 2): Met OT Short Term Goal 4 (Week 2): Pt will perform sit to stands with supervision with min cuing for hand placement  OT Short Term Goal 4 - Progress (Week 2): Met Week 3:  OT Short Term Goal 1 (Week 3): Family education with wife will be completed  OT Short Term Goal 2 (Week 3): STG=LTG  Skilled Therapeutic Interventions/Progress Updates:    1:1 self care retraining at shower level including functional ambulation, toileting, toilet and shower stall transfers, simple problem solving, intellectual and anticipatory awareness with cuing in a supportive environment related to basic self care tasks, attention, working memory, functional expression of self, self monitoring errors in performance and language, sequencing and task organization  2nd session 1:1 with Rec therapy: 13:30-14:15 When down to solarium to play the piano focused on safety awareness, intellectual awareness of deficits- with improved insight with minimal cuing about cognitive abilities & deficits, difficulty with generating thoughts without perimeters provided. Also discussed home and how he would need someone with him all the time at home for A and his wife coming in on Thursday for family education, and plans for going on an outing tomorrow and generating goals to work towards with mod A- max A    Therapy  Documentation Precautions:  Precautions Precautions: Fall Precaution Comments: DVT in right UE- restricted  Restrictions Weight Bearing Restrictions: No General:   Vital Signs:   Pain:   ADL:   Exercises:   Other Treatments:    See FIM for current functional status  Therapy/Group: Individual Therapy  Roney Mans [facility] 05/21/2012, 12:30 PM

## 2012-05-21 NOTE — Progress Notes (Signed)
Physical Therapy Weekly Progress Note  Patient Details  Name: Wayne Buckley MRN: [id number] Date of Birth: [birth year]  Today's Date: 05/21/2012  Patient has met 2 of 3 short term goals.  Pt has improved balance, gait and transfers and is now at a close supervision level using a RW.  Pt continues to require assist for awareness in functional situations.    Patient continues to demonstrate the following deficits: impaired motor timing and sequencing, trunk and limb ataxia, impaired coordination and balance, impaired gait, decreased trunk and hip strength, impaired awareness and therefore will continue to benefit from skilled PT intervention to enhance overall performance with activity tolerance, balance, postural control, ability to compensate for deficits, awareness and coordination.  Patient progressing toward long term goals..  Continue plan of care.  PT Short Term Goals Week 2:  PT Short Term Goal 1 (Week 2): Pt will gait in home environment with min A 25' PT Short Term Goal 1 - Progress (Week 2): Met PT Short Term Goal 2 (Week 2): Pt will demo intellectual awareness with supervision cuing  PT Short Term Goal 2 - Progress (Week 2): Progressing toward goal PT Short Term Goal 3 (Week 2): Pt will demo dynamic standing balance with min A for functional task PT Short Term Goal 3 - Progress (Week 2): Met  Skilled Therapeutic Interventions/Progress Updates:  Ambulation/gait training;Community reintegration;DME/adaptive equipment instruction;Neuromuscular re-education;Therapeutic Exercise;UE/LE Strength taining/ROM;Pain management;Discharge planning;Balance/vestibular training;Cognitive remediation/compensation;Functional mobility training;Therapeutic Activities;UE/LE Coordination activities;Wheelchair propulsion/positioning;Stair training;Patient/family education;Splinting/orthotics    See FIM for current functional status  Wayne Buckley 05/21/2012, 10:50 AM

## 2012-05-21 NOTE — Evaluation (Signed)
Recreational Therapy Assessment and Plan  Patient Details  Name: Wayne Buckley MRN: [id number] Date of Birth: 1950-11-25 Today's Date: 05/21/2012  Rehab Potential: Good ELOS: 2 weeks   Assessment Clinical Impression: Problem List:  Patient Active Problem List   Diagnosis   .  Acute respiratory failure   .  Subdural hematoma, post-traumatic   .  Aspiration pneumonia   .  Alcohol abuse   .  Hypokalemia   .  Urinary retention   .  Encephalopathy acute    Past Medical History:  Past Medical History   Diagnosis  Date   .  Hypertension     Past Surgical History:  Past Surgical History   Procedure  Date   .  Craniotomy  04/28/2012     Procedure: CRANIOTOMY HEMATOMA EVACUATION SUBDURAL; Surgeon: Reinaldo Meeker, MD; Location: [facility] OR; Service: Neurosurgery; Laterality: Left; Left Craniotomy for Subdural Hematoma    Assessment & Plan  Clinical Impression: Patient is a 62 y.o. year old male with history of HTN, ETOH abuse who was found down and noted to be comatose at admission on 04/28/12. UDS + for THC and ETOH @121 . CT head with left frontal lobe IPH, left frontal SDH, L-SAH, left to right midline shift with suspicion of early uncal herniation. Patient intubated for airway protection and underwent L-crani for evacuation of SDH, left frontal hemorrhage and partial left frontal lobectomy the same day by Dr. Gerlene Fee. F/U CCT with resolution of SAH and stable post op changes without new bleeding. Started on zosyn for fever with leucocytosis due to LLL HAP on 11/12--through 11/16. Agitation Resolving. Extubated on 11/14. Noted to have RUE axillary DVT on 11/14--no heparin or blood thinner for now per NS and PICC discontinued. Foley discontinued 11/15 and patient unable to void, therefore foley replaced. Therapies initiated. Patient has poor attention with distractibility, confusion and expressive deficits. On D2, thin liquids due to right buccal pocketing and needs cues for safe swallow strategies.  He continues with right sided weakness with right knee, right facial droop and with lean to the right.  Patient transferred to CIR on 05/07/2012.   Pt presents with decreased activity tolerance, decreased functional mobility, decreased balance, decreased coordination, right inattention, decreased initiation, decreased attention, decreased awareness, decreased problem solving, decreased safety awareness, decreased memory and delayed processing, right sided weakness, limiting pt's independence with leisure/community pursuits.   Leisure History/Participation Premorbid leisure interest/current participation: Garment/textile technologist - Travel (Comment);Community - Press photographer - Physicist, medical Expression Interests: Music (Comment);Play instrument (Comment);Singing Other Leisure Interests: Animator Leisure Participation Style: Alone;With Family/Friends Awareness of Community Resources: Good-identify 3 post discharge leisure resources Psychosocial / Spiritual Patient agreeable to Pet Therapy: Yes Social interaction - Mood/Behavior: Cooperative Firefighter Appropriate for Education?: Yes Patient Agreeable to [employer]?: Yes Recreational Therapy Orientation Orientation -Reviewed with patient: Available activity resources Strengths/Weaknesses Patient Strengths/Abilities: Willingness to participate;Active premorbidly Patient weaknesses: Physical limitations  Plan Rec Therapy Plan Is patient appropriate for Therapeutic Recreation?: Yes Rehab Potential: Good Treatment times per week: Min 1 time per week > 20 minutes Estimated Length of Stay: 2 weeks TR Treatment/Interventions: Adaptive equipment instruction;1:1 session;Balance/vestibular training;Community reintegration;Functional mobility training;Cognitive remediation/compensation;Patient/family education;Recreation/leisure participation;Therapeutic activities  Recommendations for other services: None  Discharge Criteria: Patient will be  discharged from TR if patient refuses treatment 3 consecutive times without medical reason.  If treatment goals not met, if there is a change in medical status, if patient makes no progress towards goals or if patient is discharged from hospital.  The above  assessment, treatment plan, treatment alternatives and goals were discussed and mutually agreed upon: by patient  Larose Batres 05/21/2012, 4:38 PM

## 2012-05-21 NOTE — Progress Notes (Signed)
Subjective/Complaints: No major issues. Continues to display poor safety awareness A 12 point review of systems has been performed and if not noted above is otherwise negative.   Objective: Vital Signs: Blood pressure 144/90, pulse 107, temperature 99 F (37.2 C), temperature source Oral, resp. rate 18, weight 77 kg (169 lb 12.1 oz), SpO2 99.00%. No results found. No results found for this basename: WBC:2,HGB:2,HCT:2,PLT:2 in the last 72 hours No results found for this basename: NA:2,K:2,CL:2,CO2:2,GLUCOSE:2,BUN:2,CREATININE:2,CALCIUM:2 in the last 72 hours CBG (last 3)  No results found for this basename: GLUCAP:3 in the last 72 hours  Wt Readings from Last 3 Encounters:  05/15/12 77 kg (169 lb 12.1 oz)  05/04/12 83.9 kg (184 lb 15.5 oz)  05/04/12 83.9 kg (184 lb 15.5 oz)    Physical Exam:  Constitutional: He appears well-developed and well-nourished.  HENT:  Head: Normocephalic.  Left crani incision clean and dry with staples in place.   Scab centrally still Eyes: Pupils are equal, round, and reactive to light.  Neck: Normal range of motion.  Cardiovascular: Normal rate and regular rhythm.  Pulmonary/Chest: Effort normal and breath sounds normal.  Abdominal: Soft. Bowel sounds are normal.  Musculoskeletal: He exhibits no edema.  Neurological: He is alert.  Oriented to self and place today. Answers simple questions. Limited insight and and awareness.   Right sided weakness UE>LE--?motor apraxia RUE is  4/5. RLE is  Also 4/5.  Pain sense better on right. Speech more automatic. --identified multiple objects for me. Able to carry simple conversation. Some receptive and expressive deficits still but much improved.   Psychiatric:    calm, collected    Assessment/Plan: 1. 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 Physiatrist is providing close team  supervision and 24 hour management of active medical problems listed below. Physiatrist and rehab team continue to assess barriers to discharge/monitor patient progress toward functional and medical goals.        FIM: FIM - Bathing Bathing Steps Patient Completed: Chest;Right Arm;Left Arm;Abdomen;Front perineal area;Buttocks;Right upper leg;Left upper leg;Left lower leg (including foot);Right lower leg (including foot) Bathing: 4: Steadying assist  FIM - Upper Body Dressing/Undressing Upper body dressing/undressing steps patient completed: Thread/unthread left sleeve of pullover shirt/dress;Thread/unthread right sleeve of pullover shirt/dresss;Put head through opening of pull over shirt/dress;Pull shirt over trunk Upper body dressing/undressing: 5: Supervision: Safety issues/verbal cues FIM - Lower Body Dressing/Undressing Lower body dressing/undressing steps patient completed: Thread/unthread right underwear leg;Thread/unthread left underwear leg;Pull underwear up/down;Pull pants up/down;Thread/unthread left pants leg;Thread/unthread right pants leg;Don/Doff right shoe;Don/Doff left shoe Lower body dressing/undressing: 4: Min-Patient completed 75 plus % of tasks  FIM - Toileting Toileting steps completed by patient: Performs perineal hygiene;Adjust clothing prior to toileting;Adjust clothing after toileting Toileting Assistive Devices: Grab bar or rail for support Toileting: 4: Steadying assist  FIM - Diplomatic Services operational officer Devices: Grab bars Toilet Transfers: 4-To toilet/BSC: Min A (steadying Pt. > 75%);4-From toilet/BSC: Min A (steadying Pt. > 75%)  FIM - Bed/Chair Transfer Bed/Chair Transfer Assistive Devices: Arm rests Bed/Chair Transfer: 4: Sit > Supine: Min A (steadying pt. > 75%/lift 1 leg);5: Supine > Sit: Supervision (verbal cues/safety issues);4: Bed > Chair or W/C: Min A (steadying Pt. > 75%)  FIM - Locomotion: Wheelchair Distance: 45' Locomotion:  Wheelchair: 1: Total Assistance/staff pushes wheelchair (Pt<25%) FIM - Locomotion: Ambulation Locomotion: Ambulation Assistive Devices: Walker - Rolling;Other (comment) Ambulation/Gait Assistance: 4: Min assist Locomotion:  Ambulation: 1: Travels less than 50 ft with minimal assistance (Pt.>75%)  Comprehension Comprehension Mode: Auditory Comprehension: 5-Follows basic conversation/direction: With extra time/assistive device  Expression Expression Mode: Verbal Expression: 5-Expresses basic needs/ideas: With extra time/assistive device  Social Interaction Social Interaction: 5-Interacts appropriately 90% of the time - Needs monitoring or encouragement for participation or interaction.  Problem Solving Problem Solving: 5-Solves basic 90% of the time/requires cueing < 10% of the time  Memory Memory: 4-Recognizes or recalls 75 - 89% of the time/requires cueing 10 - 24% of the time  Medical Problem List and Plan:  1. DVT Prophylaxis/Anticoagulation: Mechanical: Sequential compression devices, below knee Bilateral lower extremities  2. Pain Management: denies any pain.  3. Mood: no signs of distress. Patient with significant cognitive deficits with poor awareness and impaired insight.  4. Neuropsych: This patient is not capable of making decisions on his/her own behalf.    5. ABLA:improved 10.9--follow up later this week   6. RUE DVT: no blood thinner for now per NS.  7. Polysubstance abuse: outpt follow up 8. HTN: off BP meds at current time. Monitor with bid checks for trends.  9. Urine retention/neurogenic bladder: generally continent 10. FEN: eating well.  11. Wound: staples out  LOS (Days) 14 A FACE TO FACE EVALUATION WAS PERFORMED  SWARTZ,ZACHARY T 05/21/2012, 6:34 AM

## 2012-05-21 NOTE — Plan of Care (Signed)
Problem: RH SAFETY Goal: RH STG DECREASED RISK OF FALL WITH ASSISTANCE STG Decreased Risk of Fall With mod Assistance.  Outcome: Not Progressing Patient is impulsive with poor safety awareness. Patient not using call bell for assistance to get out of bed. A. Korion Cuevas,LPN

## 2012-05-21 NOTE — Progress Notes (Signed)
Physical Therapy Note  Patient Details  Name: Wayne Buckley MRN: [id number] Date of Birth: February 10, 1951 Today's Date: 05/21/2012  Time: 800-856 56 minutes  No c/o pain.  Gait training without AD for balance and focus on trunk extension, pt able to gait with min A with cues for posture and to decrease shuffling of feet.  Gait without AD while holding ball for improved trunk control with min A cues for trunk extension throughout.  Gait with RW multiple attempts >150' with supervision, cuing for safety with RW and posture.  Standing NMR with focus on stance control B hips with tap ups, SLS activities, step ups all directions.  Pt able to perform with min-mod A.  Continues to be limited by decreased trunk and hip strength and coordination, improving gait and balance.  Individual therapy   Angell Honse 05/21/2012, 8:56 AM

## 2012-05-21 NOTE — Progress Notes (Signed)
Speech Language Pathology Daily Session Notes  Patient Details  Name: Wayne Buckley MRN: [id number] Date of Birth: 12-Jul-1950  Today's Date: 05/21/2012  Session 1 Time: 1050-1130 Time Calculation (min): 40 min  Session 2 Time: 1145-1230 Time Calculation: 45 minutes   Short Term Goals: Week 2: SLP Short Term Goal 1 (Week 2): Pt will utilize call bell to express wants/needs with Max A multimodal cueing SLP Short Term Goal 1 - Progress (Week 2): Progressing toward goal SLP Short Term Goal 2 (Week 2): Pt will consume current diet with Min A verbal cues for utilization of swallowing compensatory strategies SLP Short Term Goal 2 - Progress (Week 2): Progressing toward goal SLP Short Term Goal 3 (Week 2): Pt will demonstrate selective attention to a functional task in a mildly distracting enviornment for ~30 minutes with Min verbal cues for redirection  SLP Short Term Goal 3 - Progress (Week 2): Progressing toward goal SLP Short Term Goal 4 (Week 2): Pt will consume trails of Dys. 3 textures and demonstrate efficient mastication with Mod verbal cues without overt s/s of aspiration  SLP Short Term Goal 4 - Progress (Week 2): Progressing toward goal SLP Short Term Goal 5 (Week 2): Pt will demonstrate functional problem solving for functional tasks with supervision verbal cues.  SLP Short Term Goal 5 - Progress (Week 2): Progressing toward goal  Skilled Therapeutic Interventions:  Session 1: Treatment focus on cognitive and dysphagia goals. Pt consumed trials of Dys. 3 textures and demonstrated efficient mastication with throat clear X 2 due to large boluses.  Pt required Min verbal cues for utilization of small bites and Mod verbal and semantic cues to verbally problem solve/reason why utilization of small bites increases the pt's overall swallowing safety.  Pt verbalized understanding.   Session 2: Pt seen in [employer] for dysphagia goals. Pt required Min verbal cues for utilization of small  bites and to decrease verbal expression with a full oral cavity. Pt without overt s/s of aspiration throughout meal.   FIM:  Comprehension Comprehension Mode: Auditory Comprehension: 5-Follows basic conversation/direction: With extra time/assistive device Expression Expression Mode: Verbal Expression: 5-Expresses basic needs/ideas: With extra time/assistive device Social Interaction Social Interaction: 5-Interacts appropriately 90% of the time - Needs monitoring or encouragement for participation or interaction. Problem Solving Problem Solving: 4-Solves basic 75 - 89% of the time/requires cueing 10 - 24% of the time Memory Memory: 3-Recognizes or recalls 50 - 74% of the time/requires cueing 25 - 49% of the time FIM - Eating Eating Activity: 5: Needs verbal cues/supervision  Pain Pain Assessment Pain Assessment: No/denies pain  Therapy/Group: Individual Therapy & Group Therapy   Krrish Freund 05/21/2012, 3:59 PM

## 2012-05-21 NOTE — Progress Notes (Signed)
Occupational Therapy Session Note  Patient Details  Name: Wayne Buckley MRN: [id number] Date of Birth: 1951-01-16  Today's Date: 05/21/2012 Time: 4098-1191 Time Calculation (min): 53 min  Skilled Therapeutic Interventions/Progress Updates:    Pt seen for OT treatment used RW to walk to and from the OT kitchen.  Had him work on making an egg and cheese sandwich.  Required max instructional cueing to stay inside the walker, especially with turns.  In addition, at times pt would leave walker and step outside of it to move over to the counter or refrigerator.  During cooking task pt able to recall items needed and sequence through task but demonstrated decreased safety when using his fingers to reach into the pan on multiple occassions instead of using his spatula. Pt with limited awareness of his cognitive and physical deficits also.  When asked if he thought it was safe to get up in his room without assistance he felt his balance.  Did state that his walking is different than before the accident and that he has trouble keeping his left foot straight.  Therapy Documentation Precautions:  Precautions Precautions: Fall Precaution Comments: Dysphasia II diet, thin liquids Restrictions Weight Bearing Restrictions: No Pain: Pain Assessment Pain Assessment: No/denies pain  Other Treatments:    See FIM for current functional status  Therapy/Group: Individual Therapy  Trigger Frasier,Srinivas OTR/L 05/21/2012, 4:06 PM

## 2012-05-22 ENCOUNTER — Inpatient Hospital Stay (HOSPITAL_COMMUNITY): Payer: 59 | Admitting: Occupational Therapy

## 2012-05-22 ENCOUNTER — Inpatient Hospital Stay (HOSPITAL_COMMUNITY): Payer: 59 | Admitting: Speech Pathology

## 2012-05-22 ENCOUNTER — Inpatient Hospital Stay (HOSPITAL_COMMUNITY): Payer: 59 | Admitting: Physical Therapy

## 2012-05-22 NOTE — Progress Notes (Signed)
Social Work Patient ID: Wayne Buckley, male   DOB: 1950-10-09, 61 y.o.   MRN: [id number]  Have reviewed team conference with patient and wife - continue to plan toward 12/10 discharge home with wife and daughter.  Both to be in tomorrow to go through family education.  Wife requests that their daughter be able to meet with [name], OT and myself to receive some general TBI education and support as well.  We plan to meet with her at 4:30.    Alonza Bogus, Valentina Gu

## 2012-05-22 NOTE — Progress Notes (Signed)
Physical Therapy Note  Patient Details  Name: Wayne Buckley MRN: [id number] Date of Birth: 1951-04-17 Today's Date: 05/22/2012  Time: 800-855 55 minutes  No c/o pain.  Gait training with RW supervision >150' in controlled environment with cues for staying close to RW and for upright posture.  Obstacle negotiation with RW with close supervision, no LOB, cues for safety.  Gait without AD with min A for controlled environment and obstacle negotiation.  Pt with slight LOB with turning requiring min A to correct due to delayed balance reactions.  Practice simulated [name] steps to prepare for outing today.  Pt able to perform with min A with 1 handrail.  Standing NMR with focus on hip and trunk control, coordination.  Eccentric tap downs all directions with min A with 1 UE support.  Standing with 1 LE on step with B trunk PNFs with steadying assist, min A when more fatigued.  Pt continues to improve activity tolerance and balance, limited by poor safety awareness.  Individual therapy   Jamyla Ard 05/22/2012, 8:56 AM

## 2012-05-22 NOTE — Progress Notes (Signed)
Subjective/Complaints: Feeling well. Got hair cut. Slept well A 12 point review of systems has been performed and if not noted above is otherwise negative.   Objective: Vital Signs: Blood pressure 149/81, pulse 102, temperature 98.7 F (37.1 C), temperature source Oral, resp. rate 19, weight 77 kg (169 lb 12.1 oz), SpO2 96.00%. No results found. No results found for this basename: WBC:2,HGB:2,HCT:2,PLT:2 in the last 72 hours No results found for this basename: NA:2,K:2,CL:2,CO2:2,GLUCOSE:2,BUN:2,CREATININE:2,CALCIUM:2 in the last 72 hours CBG (last 3)  No results found for this basename: GLUCAP:3 in the last 72 hours  Wt Readings from Last 3 Encounters:  05/15/12 77 kg (169 lb 12.1 oz)  05/04/12 83.9 kg (184 lb 15.5 oz)  05/04/12 83.9 kg (184 lb 15.5 oz)    Physical Exam:  Constitutional: He appears well-developed and well-nourished.  HENT:  Head: Normocephalic.  Left crani incision clean and dry with staples in place.   Scab centrally still Eyes: Pupils are equal, round, and reactive to light.  Neck: Normal range of motion.  Cardiovascular: Normal rate and regular rhythm.  Pulmonary/Chest: Effort normal and breath sounds normal.  Abdominal: Soft. Bowel sounds are normal.  Musculoskeletal: He exhibits no edema.  Neurological: He is alert.  Oriented to self and place today. Answers simple questions. Limited insight and and awareness.   Right sided weakness UE>LE--?motor apraxia RUE is  4/5. RLE is  Also 4/5.  Pain sense better on right. Speech more automatic. --identified multiple objects for me. Able to carry simple conversation. Some receptive and expressive deficits still but much improved.   Psychiatric:    calm, collected Skin: wound clean and intact    Assessment/Plan: 1. 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 Physiatrist is providing  close team supervision and 24 hour management of active medical problems listed below. Physiatrist and rehab team continue to assess barriers to discharge/monitor patient progress toward functional and medical goals.        FIM: FIM - Bathing Bathing Steps Patient Completed: Chest;Right Arm;Left Arm;Abdomen;Front perineal area;Buttocks;Right upper leg;Left upper leg;Left lower leg (including foot);Right lower leg (including foot) Bathing: 4: Steadying assist  FIM - Upper Body Dressing/Undressing Upper body dressing/undressing steps patient completed: Thread/unthread left sleeve of pullover shirt/dress;Thread/unthread right sleeve of pullover shirt/dresss;Put head through opening of pull over shirt/dress;Pull shirt over trunk Upper body dressing/undressing: 5: Supervision: Safety issues/verbal cues FIM - Lower Body Dressing/Undressing Lower body dressing/undressing steps patient completed: Thread/unthread right underwear leg;Thread/unthread left underwear leg;Pull underwear up/down;Pull pants up/down;Thread/unthread left pants leg;Thread/unthread right pants leg;Don/Doff right shoe;Don/Doff left shoe;Don/Doff right sock;Don/Doff left sock Lower body dressing/undressing: 5: Supervision: Safety issues/verbal cues  FIM - Toileting Toileting steps completed by patient: Adjust clothing prior to toileting;Performs perineal hygiene;Adjust clothing after toileting Toileting Assistive Devices: Grab bar or rail for support Toileting: 5: Supervision: Safety issues/verbal cues  FIM - Diplomatic Services operational officer Devices: Grab bars Toilet Transfers: 4-To toilet/BSC: Min A (steadying Pt. > 75%);4-From toilet/BSC: Min A (steadying Pt. > 75%)  FIM - Bed/Chair Transfer Bed/Chair Transfer Assistive Devices: Arm rests Bed/Chair Transfer: 4: Sit > Supine: Min A (steadying pt. > 75%/lift 1 leg);5: Supine > Sit: Supervision (verbal cues/safety issues);4: Bed > Chair or W/C: Min A (steadying Pt.  > 75%)  FIM - Locomotion: Wheelchair Distance: 45' Locomotion: Wheelchair: 1: Total Assistance/staff pushes wheelchair (Pt<25%) FIM - Locomotion: Ambulation Locomotion: Ambulation Assistive Devices: Walker - Rolling;Other (comment)  Ambulation/Gait Assistance: 4: Min assist Locomotion: Ambulation: 5: Travels 150 ft or more with supervision/safety issues  Comprehension Comprehension Mode: Auditory Comprehension: 4-Understands basic 75 - 89% of the time/requires cueing 10 - 24% of the time  Expression Expression Mode: Verbal Expression: 4-Expresses basic 75 - 89% of the time/requires cueing 10 - 24% of the time. Needs helper to occlude trach/needs to repeat words.  Social Interaction Social Interaction: 4-Interacts appropriately 75 - 89% of the time - Needs redirection for appropriate language or to initiate interaction.  Problem Solving Problem Solving: 4-Solves basic 75 - 89% of the time/requires cueing 10 - 24% of the time  Memory Memory: 3-Recognizes or recalls 50 - 74% of the time/requires cueing 25 - 49% of the time  Medical Problem List and Plan:  1. DVT Prophylaxis/Anticoagulation: Mechanical: Sequential compression devices, below knee Bilateral lower extremities  2. Pain Management: denies any pain.  3. Mood: no signs of distress. Patient with significant cognitive deficits with poor awareness and impaired insight.  4. Neuropsych: This patient is not capable of making decisions on his/her own behalf.    5. ABLA:improved 10.9--follow up tomorrow 6. RUE DVT: would not anticoagulate given location and recent surgery 7. Polysubstance abuse: outpt follow up 8. HTN: off BP meds at current time. Monitor with bid checks for trends.  9. Urine retention/neurogenic bladder: generally continent 10. FEN: eating well.  11. Wound: staples out  LOS (Days) 15 A FACE TO FACE EVALUATION WAS PERFORMED  Advika Mclelland T 05/22/2012, 6:40 AM

## 2012-05-22 NOTE — Patient Care Conference (Signed)
Inpatient RehabilitationTeam Conference Note Date: 05/21/2012   Time: 3:16 PM    Patient Name: Wayne Buckley      Medical Record Number: [id number]  Date of Birth: 1951-04-17 Sex: Male         Room/Bed: 4025/4025-01 Payor Info: Payor: Advertising copywriter  Plan: [place] [facility]  Product Type: *No Product type*     Admitting Diagnosis: TBI  Admit Date/Time:  05/07/2012  7:29 PM Admission Comments: No comment available   Primary Diagnosis:  Subdural hematoma, post-traumatic Principal Problem: Subdural hematoma, post-traumatic  Patient Active Problem List   Diagnosis Date Noted  . Alcohol abuse 05/04/2012  . Hypokalemia 05/04/2012  . Urinary retention 05/04/2012  . Encephalopathy acute 05/04/2012  . Aspiration pneumonia 05/02/2012  . Acute respiratory failure 04/28/2012  . Subdural hematoma, post-traumatic 04/28/2012    Expected Discharge Date: Expected Discharge Date: 05/28/12  Team Members Present: Physician: Dr. Faith Rogue Social Worker Present: [name], LCSW Nurse Present: Other (comment) Antony Contras, RN) PT Present: Reggy Eye, PT OT Present: Roney Mans, Loistine Chance, OT;Ardis Rowan, COTA SLP Present: Feliberto Gottron, SLP Other (Discipline and Name): Tora Duck, PPS Coordinator     Current Status/Progress Goal Weekly Team Focus  Medical   improved speech and behavior  see prior  see prior   Bowel/Bladder   LBM 05/20/12 per patient report  mod independnet   toilet prn and every 3-4 hours if paitient does not request   Swallow/Nutrition/ Hydration             ADL's   supervision to steady A  supervision  safety awareness, awareness, dynamic balance, functional ambulation, family education   Mobility   min A  supervision  balance, awareness, coordination   Communication   Min-Mod A  Min A  word-finding strategies    Safety/Cognition/ Behavioral Observations  no restraints sidereails x 3 bed alarm   min assist   educate on attention , awareness,  problem soving    Pain   n/a         Skin   left crani incision healing staples d/c'd   min assist skin care   continue to educate family on skin care and incision care     Rehab Goals Patient on target to meet rehab goals: Yes *See Interdisciplinary Assessment and Plan and progress notes for long and short-term goals  Barriers to Discharge: see prior    Possible Resolutions to Barriers:   see prior    Discharge Planning/Teaching Needs:  home with wife and daughter - arranging 24/7 supervision      Team Discussion:  Planning outing tomorrow and family education on Thursday.  Continue with poor insight into deficits but some improvement since last week.  Better with bladder continence.  To trial D3 but not quite ready for full meal.  Revisions to Treatment Plan:  None   Continued Need for Acute Rehabilitation Level of Care: The patient requires daily medical management by a physician with specialized training in physical medicine and rehabilitation for the following conditions: Daily direction of a multidisciplinary physical rehabilitation program to ensure safe treatment while eliciting the highest outcome that is of practical value to the patient.: Yes Daily medical management of patient stability for increased activity during participation in an intensive rehabilitation regime.: Yes Daily analysis of laboratory values and/or radiology reports with any subsequent need for medication adjustment of medical intervention for : Post surgical problems;Neurological problems;Other  Hadia Minier 05/22/2012, 3:16 PM

## 2012-05-22 NOTE — Progress Notes (Signed)
Occupational Therapy Session Note  Patient Details  Name: BENIGNO CHECK MRN: [id number] Date of Birth: Jul 09, 1950  Today's Date: 05/22/2012 Time: 0865-7846 Time Calculation (min): 43 min  Short Term Goals: Week 3:  OT Short Term Goal 1 (Week 3): Family education with wife will be completed  OT Short Term Goal 2 (Week 3): STG=LTG  Skilled Therapeutic Interventions/Progress Updates:    1:1 self care retraining: focus on functional ambulation with demonstrating RW safety, sit to stands with proper hand placement with questioning cues, attention to feet positioning with standing to decreased external rotation of right LE, pt with wide BOS when standing with clothing management. During functional ambulation with RW to gym with no cues for navigation there only min cuing to slow pace to have more control of self and RW. Pt with difficulty with smoothness of movement (often has "jerky" uncoordinated movement. Perform dynamic balance activity in standing with focus on weight shifts and one limb stance to step on numbered disks on floor with steady A for balance (perform right and left)  Therapy Documentation Precautions:  Precautions Precautions: Fall Precaution Comments: Dysphasia II diet, thin liquids Restrictions Weight Bearing Restrictions: No Pain: Pain Assessment Pain Assessment: No/denies pain Pain Score: 0-No pain  See FIM for current functional status  Therapy/Group: Individual Therapy  Roney Mans [facility] 05/22/2012, 10:14 AM

## 2012-05-22 NOTE — Progress Notes (Signed)
Speech Language Pathology Session Note & Weekly Progress Notes  Patient Details  Name: Wayne Buckley MRN: [id number] Date of Birth: [birth year]  Today's Date: 05/22/2012 Time: 1130-1200 Time Calculation (min): 30 min  Short Term Goals: Week 2: SLP Short Term Goal 1 (Week 2): Pt will utilize call bell to express wants/needs with Max A multimodal cueing SLP Short Term Goal 1 - Progress (Week 2): Met SLP Short Term Goal 2 (Week 2): Pt will consume current diet with Min A verbal cues for utilization of swallowing compensatory strategies SLP Short Term Goal 2 - Progress (Week 2): Met SLP Short Term Goal 3 (Week 2): Pt will demonstrate selective attention to a functional task in a mildly distracting enviornment for ~30 minutes with Min verbal cues for redirection  SLP Short Term Goal 3 - Progress (Week 2): Met SLP Short Term Goal 4 (Week 2): Pt will consume trails of Dys. 3 textures and demonstrate efficient mastication with Mod verbal cues without overt s/s of aspiration  SLP Short Term Goal 4 - Progress (Week 2): Met SLP Short Term Goal 5 (Week 2): Pt will demonstrate functional problem solving for functional tasks with supervision verbal cues.  SLP Short Term Goal 5 - Progress (Week 2): Met  New Short Term Goals:  Week 3: SLP Short Term Goal 1 (Week 3): Pt will utilize swallowing compensatory strategies with supervision question cues.  SLP Short Term Goal 2 (Week 3): Pt will demonstrate alternating attention in a group setting with Mod verbal cues for redirection.  SLP Short Term Goal 3 (Week 3): Pt will demonstrate functional problem solving for mildly complex problem solving tasks with supervision verbal cues.  SLP Short Term Goal 4 (Week 3): Pt will demonstrate emergent awareness of deficits with supervision question cues.  SLP Short Term Goal 5 (Week 3): Pt will utilize call bell to express wants/needs with Min A verbal cues.  SLP Short Term Goal 6 (Week 3): Pt will utilize word-finding  strategies at the conversation level with supervision question cues.   Weekly Progress Updates: Pt has made functional gains and has met 5 of 5 STG's this reporting period. Currently, pt is demonstrating behaviors consistent with a Rancho Level VII and is overall supervision-Min A for functional problem solving, recall of new information, selective attention, and intellectual awareness of deficits. Pt is also currently consuming Dys. 3 textures with thin liquids and requires supervision-Min A verbal cues for utilization of swallowing compensatory strategies. Pt is also overall Min A for expression of wants/needs and word-finding at th e conversation level and Mod A for mildly complex verbal expression.  Pt would benefit from continued skilled SLP services to maximize cognitive function, swallowing function with least restrictive diet and verbal expression. Pt/family education ongoing.    SLP Frequency: 1-2 X/day, 30-60 minutes Estimated Length of Stay: 1 week SLP Treatment/Interventions: Cognitive remediation/compensation;Cueing hierarchy;Environmental controls;Functional tasks;Patient/family education;Therapeutic Activities;Internal/external aids;Dysphagia/aspiration precaution training;Speech/Language facilitation  Daily Session Skilled Therapeutic Intervention: Pt seen in [employer] with focus on cognitive and dysphagia goals. Pt consumed Dys. 2 textures with thin liquids without overt s/s of aspiration and required supervision verbal cues to utilize small bites and a slow pace. Pt also required Mod verbal cues to alternate attention between a functional conversation and his meal.  FIM:  Comprehension Comprehension Mode: Auditory Comprehension: 4-Understands basic 75 - 89% of the time/requires cueing 10 - 24% of the time Expression Expression Mode: Verbal Expression: 4-Expresses basic 75 - 89% of the time/requires cueing 10 - 24%  of the time. Needs helper to occlude trach/needs to repeat  words. Social Interaction Social Interaction: 4-Interacts appropriately 75 - 89% of the time - Needs redirection for appropriate language or to initiate interaction. Problem Solving Problem Solving: 4-Solves basic 75 - 89% of the time/requires cueing 10 - 24% of the time Memory Memory: 3-Recognizes or recalls 50 - 74% of the time/requires cueing 25 - 49% of the time FIM - Eating Eating Activity: 5: Needs verbal cues/supervision Pain No/Denies Pain  Therapy/Group: Group Therapy  Kendarious Gudino 05/22/2012, 4:17 PM

## 2012-05-22 NOTE — Progress Notes (Signed)
Occupational Therapy Note  Patient Details  Name: Wayne Buckley MRN: [id number] Date of Birth: 1950/08/21 Today's Date: 05/22/2012  Time: 1200-1225 (cotx with Speech Therapy - actual time in group 1130-1225) Pt denies pain Group Therapy Pt participated in self feeding group with focus on attention to task, portion control, swallowing strategies, and appropriate socialization with group participants.  Pt required min verbal cues for redirection to task.  Pt engaged in conversation appropriately with participants and family members approx 90% of time.  Occasionally responses were not in context to conversation.   [name] [facility] 05/22/2012, 1:22 PM

## 2012-05-22 NOTE — Progress Notes (Signed)
Recreational Therapy Session Note  Patient Details  Name: Wayne Buckley MRN: [id number] Date of Birth: 02-Dec-1950 Today's Date: 05/22/2012 Time:1300-1500 Pain: no c/o Skilled Therapeutic Interventions/Progress Updates: Participated in community reintegration/outing to coffee shop ambulatory level with focus on negotiating and navigating in the community (inside and outside) with RW, RW safety, going up and down the [place] steps with min A, transferring to different surfaces with min A, intellectual to emergent awareness, abstracting thinking, mental math, simple problem solving related to community setting, recognizing differences in self now versus before the BI and relating it to being in the community/home, discussion about good life choices at home, appropriate social interaction, working and short term memory with strategies.   Therapy/Group: [employer] Andee Chivers 05/22/2012, 4:43 PM

## 2012-05-22 NOTE — Progress Notes (Signed)
Occupational Therapy Session Note  Patient Details  Name: Wayne Buckley MRN: [id number] Date of Birth: 1950/06/29  Today's Date: 05/22/2012 Time: 1300-1500 Time Calculation (min): 120 min  Short Term Goals: Week 2:  OT Short Term Goal 1 (Week 2): Pt will demonstrate intellecual awareness in functional ADL tasks with mod A OT Short Term Goal 1 - Progress (Week 2): Met OT Short Term Goal 2 (Week 2): Pt would alert staff of toileting needs with min A to be contient OT Short Term Goal 2 - Progress (Week 2): Partly met OT Short Term Goal 3 (Week 2): Pt will don LB clothing with only steady A OT Short Term Goal 3 - Progress (Week 2): Met OT Short Term Goal 4 (Week 2): Pt will perform sit to stands with supervision with min cuing for hand placement  OT Short Term Goal 4 - Progress (Week 2): Met  Skilled Therapeutic Interventions/Progress Updates:    Participated in community outing with Rec Therapy to coffee shop. Focus negotiating and navigating in the community (inside and outside) with RW, RW safety, going up and down the can stairs with min A, transferring to different surfaces with min A, intellectual to emergent awareness, abstracting thinking, mental math, simple problem solving related to community setting, recognizing differences in self now versus before the BI and relating it to being in the community/home, discussion about good life choices at home, appropriate social interaction, working and short term memory with strategies.   Therapy Documentation Precautions:  Precautions Precautions: Fall Precaution Comments: Dysphasia II diet, thin liquids Restrictions Weight Bearing Restrictions: No Pain: No c/o pain See FIM for current functional status  Therapy/Group: Individual Therapy  Roney Mans [facility] 05/22/2012, 3:45 PM

## 2012-05-22 NOTE — Progress Notes (Signed)
Recreational Therapy Session Note  Patient Details  Name: Wayne Buckley MRN: [id number] Date of Birth: 08-Mar-1951 Today's Date: 05/22/2012  Pain: no c/o Skilled Therapeutic Interventions/Progress Updates: Pt participated in animal assisted activity/therapy bed level with supervision.   Gloriana Piltz 05/22/2012, 4:44 PM

## 2012-05-23 ENCOUNTER — Encounter (HOSPITAL_COMMUNITY): Payer: 59 | Admitting: Occupational Therapy

## 2012-05-23 ENCOUNTER — Inpatient Hospital Stay (HOSPITAL_COMMUNITY): Payer: 59 | Admitting: Speech Pathology

## 2012-05-23 ENCOUNTER — Inpatient Hospital Stay (HOSPITAL_COMMUNITY): Payer: 59 | Admitting: *Deleted

## 2012-05-23 ENCOUNTER — Inpatient Hospital Stay (HOSPITAL_COMMUNITY): Payer: 59 | Admitting: Physical Therapy

## 2012-05-23 LAB — CBC
Hemoglobin: 11.6 g/dL — ABNORMAL LOW (ref 13.0–17.0)
MCHC: 33.5 g/dL (ref 30.0–36.0)
Platelets: 431 10*3/uL — ABNORMAL HIGH (ref 150–400)
RBC: 3.93 MIL/uL — ABNORMAL LOW (ref 4.22–5.81)

## 2012-05-23 LAB — BASIC METABOLIC PANEL
GFR calc non Af Amer: >90 mL/min (ref >90)
Glucose, Bld: 93 mg/dL (ref 70–99)
Potassium: 4.5 mEq/L (ref 3.5–5.1)
Sodium: 142 mEq/L (ref 135–145)

## 2012-05-23 NOTE — Progress Notes (Signed)
Physical Therapy Note  Patient Details  Name: RAYN ENDERSON MRN: [id number] Date of Birth: May 11, 1951 Today's Date: 05/23/2012  Time: 1330-1425 55 minutes Co-tx with TR  No c/o pain.  Gait 2 x > 500' with RW in mod distracting environment.  Pt required min A for balance throughout and mod cuing to attend to gait task when distracted by cognitive tasks or stimulants in environment.  Path finding with min questioning cues.  Piano playing task for coordination and attention.  Pt able to attend to piano task at 2-3 minute intervals before requiring cues.  Pt requires close supervision for seated balance due to decreased divided attention to both balance and piano task.  Individual therapy   Romonda Parker 05/23/2012, 2:27 PM

## 2012-05-23 NOTE — Progress Notes (Signed)
Occupational Therapy Session Note  Patient Details  Name: Wayne Buckley MRN: [id number] Date of Birth: 1951-02-09  Today's Date: 05/23/2012 Time: 4098-1191 Time Calculation (min): 45 min  Short Term Goals: Week 3:  OT Short Term Goal 1 (Week 3): Family education with wife will be completed  OT Short Term Goal 2 (Week 3): STG=LTG  Skilled Therapeutic Interventions/Progress Updates:    1:1 family education with pt and pt's wife with focus on ADL performance, transitioning to his home environment, participating in community and home family activities, 24 hour supervision, importance of routine in his independence, safety, review of Rancho Level BI levels, recovery, cognition and how to deal with self monitoring and self awareness, how to provide gentle and supportive cuing, interactions and activities father and daughter can participate in together.   Therapy Documentation Precautions:  Precautions Precautions: Fall Precaution Comments: Dysphasia II diet, thin liquids Restrictions Weight Bearing Restrictions: No Pain: No c/o pain See FIM for current functional status  Therapy/Group: Individual Therapy  Roney Mans [facility] 05/23/2012, 3:48 PM

## 2012-05-23 NOTE — Progress Notes (Signed)
Occupational Therapy Note  Patient Details  Name: LACHLAN MCKIM MRN: [id number] Date of Birth: 1951-06-10 Today's Date: 05/23/2012  Time: 1200-1225 (cotx with Speech Therapy - group time 1130-1225) Pt denies pain Individual Therapy Pt participated in self feeding group with focus on swallowing strategies, portion control, attention to task, and appropriate social interaction with group participants.  Pt required min verbal cues for portion control and min verbal cues for redirection to task.   [name] [facility] 05/23/2012, 4:09 PM

## 2012-05-23 NOTE — Progress Notes (Signed)
Physical Therapy Note  Patient Details  Name: Wayne Buckley MRN: [id number] Date of Birth: November 20, 1950 Today's Date: 05/23/2012  Time: [phone] 41 minutes  1:1 No c/o pain.  Session focused on pt/family ed with pt's wife.   Pt/wife safely demo'd gait in controlled and household environments with RW, bed mobility, car transfers, stair training, ramp/curb negotiation with RW, floor transfers all at supervision level.  Wife instructed and safely demo'd how to perform min A gait if needed to use in more busy environments.  Pt/wife express understanding of need for 24 hour supervision as well as recommendation for outpatient PT follow up.    Mayda Shippee 05/23/2012, 10:29 AM

## 2012-05-23 NOTE — Progress Notes (Signed)
Recreational Therapy Session Note  Patient Details  Name: Wayne Buckley MRN: [id number] Date of Birth: 1950-09-27 Today's Date: 05/23/2012 Time:  1330-1430 Pain: no c/o Skilled Therapeutic Interventions/Progress Updates: pt ambulated room <--> 3rd floor [place] using RW with min assist, min cues for safety using RW.  Pt sat with supervision on piano bench to play piano for 2-3 minute intervals requiring cues for cognition.  Therapy/Group: Co-Treatment Anast[place]ia [place] 05/23/2012, 4:33 PM

## 2012-05-23 NOTE — Progress Notes (Signed)
Nutrition Brief Note  Patient seen due to LOS.  Pt eating lunch at [employer] at time of visit.    BMI:  25.4   Pt weight is WNL  based on current BMI.   Current diet order is dysphagia 3 thin, patient is consuming approximately 100% of meals at this time. Labs and medications reviewed.   No nutrition interventions warranted at this time. If nutrition issues arise, please consult RD.   Oran Rein, RD, LDN Clinical Inpatient Dietitian Pager:  [phone] Weekend and after hours pager:  [phone]

## 2012-05-23 NOTE — Progress Notes (Signed)
Speech Language Pathology Daily Session Notes  Patient Details  Name: Wayne Buckley MRN: [id number] Date of Birth: [birth year]  Today's Date: 05/23/2012  Session 1 Time: 1030-1100 Time Calculation (min): 30 min  Session 2 Time: 1130-1200 Time Calculation: 30 minutes  Short Term Goals: Week 3: SLP Short Term Goal 1 (Week 3): Pt will utilize swallowing compensatory strategies with supervision question cues.  SLP Short Term Goal 2 (Week 3): Pt will demonstrate alternating attention in a group setting with Mod verbal cues for redirection.  SLP Short Term Goal 3 (Week 3): Pt will demonstrate functional problem solving for mildly complex problem solving tasks with supervision verbal cues.  SLP Short Term Goal 4 (Week 3): Pt will demonstrate emergent awareness of deficits with supervision question cues.  SLP Short Term Goal 5 (Week 3): Pt will utilize call bell to express wants/needs with Min A verbal cues.  SLP Short Term Goal 6 (Week 3): Pt will utilize word-finding strategies at the conversation level with supervision question cues.   Skilled Therapeutic Interventions:  Session 1: Treatment focus on pt/family education with the pt's wife in regards to pt's current verbal expression and cognitive function and strategies to utilize at home to increase word-finding, thought organization, working memory, problem solving, selective attention and safety awareness.  The pt and his wife also educated on pt's current swallowing function and appropriate textures to consume at home. Handouts given and both the pt and his wife verbalized and demonstrated understanding of all information.    Session 2: Pt seen [place] [employer] with focus on dysphagia goals. Pt consumed Dys. 3 textures with thin liquids without overt s/s of aspiration and required supervision verbal cues for small bites and a slow pace.   FIM:  Comprehension Comprehension Mode: Auditory Comprehension: 5-Understands basic 90% of the  time/requires cueing < 10% of the time Expression Expression Mode: Verbal Expression: 5-Expresses basic 90% of the time/requires cueing < 10% of the time. Social Interaction Social Interaction: 5-Interacts appropriately 90% of the time - Needs monitoring or encouragement for participation or interaction. Problem Solving Problem Solving: 5-Solves basic 90% of the time/requires cueing < 10% of the time Memory Memory: 4-Recognizes or recalls 75 - 89% of the time/requires cueing 10 - 24% of the time FIM - Eating Eating Activity: 5: Needs verbal cues/supervision  Pain Pain Assessment Pain Assessment: No/denies pain  Therapy/Group: Individual Therapy and Dysphagia Group  [name] 05/23/2012, 5:05 PM

## 2012-05-23 NOTE — Progress Notes (Signed)
Subjective/Complaints: No issues this am. rn reports good night A 12 point review of systems has been performed and if not noted above is otherwise negative.   Objective: Vital Signs: Blood pressure 151/83, pulse 85, temperature 97.7 F (36.5 C), temperature source Oral, resp. rate 18, weight 77 kg (169 lb 12.1 oz), SpO2 98.00%. No results found. No results found for this basename: WBC:2,HGB:2,HCT:2,PLT:2 in the last 72 hours No results found for this basename: NA:2,K:2,CL:2,CO2:2,GLUCOSE:2,BUN:2,CREATININE:2,CALCIUM:2 in the last 72 hours CBG (last 3)  No results found for this basename: GLUCAP:3 in the last 72 hours  Wt Readings from Last 3 Encounters:  05/15/12 77 kg (169 lb 12.1 oz)  05/04/12 83.9 kg (184 lb 15.5 oz)  05/04/12 83.9 kg (184 lb 15.5 oz)    Physical Exam:  Constitutional: He appears well-developed and well-nourished.  HENT:  Head: Normocephalic.  Eyes: Pupils are equal, round, and reactive to light.  Neck: Normal range of motion.  Cardiovascular: Normal rate and regular rhythm.  Pulmonary/Chest: Effort normal and breath sounds normal.  Abdominal: Soft. Bowel sounds are normal.  Musculoskeletal: He exhibits no edema.  Neurological: He is alert.  Oriented to self and place today. Answers simple questions. Limited insight and and awareness.   Right sided weakness UE>LE--?motor apraxia RUE is  4/5. RLE is  Also 4/5.  Pain sense better on right. Speech more automatic. --identified multiple objects for me. Able to carry simple conversation. Some receptive and expressive deficits still but much improved.   Psychiatric:    calm, collected Skin: wound clean and intact, no drainage    Assessment/Plan: 1. 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 Physiatrist is providing close team supervision and 24 hour management of active medical  problems listed below. Physiatrist and rehab team continue to assess barriers to discharge/monitor patient progress toward functional and medical goals.        FIM: FIM - Bathing Bathing Steps Patient Completed: Chest;Right Arm;Right upper leg;Left lower leg (including foot);Left upper leg;Left Arm;Front perineal area;Right lower leg (including foot);Abdomen;Buttocks Bathing: 5: Set-up assist to: Adjust water temp  FIM - Upper Body Dressing/Undressing Upper body dressing/undressing steps patient completed: Thread/unthread right sleeve of pullover shirt/dresss;Thread/unthread left sleeve of pullover shirt/dress;Put head through opening of pull over shirt/dress;Pull shirt over trunk Upper body dressing/undressing: 5: Set-up assist to: Obtain clothing/put away FIM - Lower Body Dressing/Undressing Lower body dressing/undressing steps patient completed: Thread/unthread right underwear leg;Thread/unthread left underwear leg;Thread/unthread right pants leg;Pull pants up/down;Don/Doff left sock;Don/Doff right shoe;Don/Doff left shoe;Don/Doff right sock;Thread/unthread left pants leg;Pull underwear up/down;Fasten/unfasten pants Lower body dressing/undressing: 5: Supervision: Safety issues/verbal cues  FIM - Toileting Toileting steps completed by patient: Adjust clothing prior to toileting;Performs perineal hygiene;Adjust clothing after toileting Toileting Assistive Devices: Grab bar or rail for support Toileting: 5: Supervision: Safety issues/verbal cues  FIM - Diplomatic Services operational officer Devices: Grab bars Toilet Transfers: 5-To toilet/BSC: Supervision (verbal cues/safety issues);5-From toilet/BSC: Supervision (verbal cues/safety issues)  FIM - Press photographer Assistive Devices: Arm rests Bed/Chair Transfer: 5: Supine > Sit: Supervision (verbal cues/safety issues);5: Sit > Supine: Supervision (verbal cues/safety issues);5: Bed > Chair or W/C: Supervision  (verbal cues/safety issues);5: Chair or W/C > Bed: Supervision (verbal cues/safety issues)  FIM - Locomotion: Wheelchair Distance: 45' Locomotion: Wheelchair: 1: Total Assistance/staff pushes wheelchair (Pt<25%) FIM - Locomotion: Ambulation Locomotion: Ambulation Assistive Devices: Walker - Rolling;Other (comment) Ambulation/Gait Assistance: 4: Min assist Locomotion:  Ambulation: 5: Travels 150 ft or more with supervision/safety issues  Comprehension Comprehension Mode: Auditory Comprehension: 4-Understands basic 75 - 89% of the time/requires cueing 10 - 24% of the time  Expression Expression Mode: Verbal Expression: 4-Expresses basic 75 - 89% of the time/requires cueing 10 - 24% of the time. Needs helper to occlude trach/needs to repeat words.  Social Interaction Social Interaction: 4-Interacts appropriately 75 - 89% of the time - Needs redirection for appropriate language or to initiate interaction.  Problem Solving Problem Solving: 4-Solves basic 75 - 89% of the time/requires cueing 10 - 24% of the time  Memory Memory: 3-Recognizes or recalls 50 - 74% of the time/requires cueing 25 - 49% of the time  Medical Problem List and Plan:  1. DVT Prophylaxis/Anticoagulation: Mechanical: Sequential compression devices, below knee Bilateral lower extremities  2. Pain Management: denies any pain.  3. Mood: no signs of distress. Patient with significant cognitive deficits with poor awareness and impaired insight.  4. Neuropsych: This patient is not capable of making decisions on his/her own behalf.    5. ABLA:improved 10.9--follow up today 6. RUE DVT: would not anticoagulate given location and recent surgery 7. Polysubstance abuse: outpt follow up 8. HTN: off BP meds at current time. Monitor with bid checks for trends.  9. Urine retention/neurogenic bladder: generally continent 10. FEN: eating well. bmet today 11. Wound: staples out  LOS (Days) 16 A FACE TO FACE EVALUATION WAS  PERFORMED  SWARTZ,ZACHARY T 05/23/2012, 6:31 AM

## 2012-05-24 ENCOUNTER — Inpatient Hospital Stay (HOSPITAL_COMMUNITY): Payer: 59 | Admitting: Physical Therapy

## 2012-05-24 ENCOUNTER — Inpatient Hospital Stay (HOSPITAL_COMMUNITY): Payer: 59 | Admitting: Speech Pathology

## 2012-05-24 ENCOUNTER — Encounter (HOSPITAL_COMMUNITY): Payer: 59 | Admitting: Occupational Therapy

## 2012-05-24 NOTE — Progress Notes (Signed)
Occupational Therapy Session Note  Patient Details  Name: Wayne Buckley MRN: [id number] Date of Birth: Jul 02, 1950  Today's Date: 05/24/2012 Time: 1015-1110 Time Calculation (min): 55 min  Short Term Goals: Week 3:  OT Short Term Goal 1 (Week 3): Family education with wife will be completed  OT Short Term Goal 2 (Week 3): STG=LTG  Skilled Therapeutic Interventions/Progress Updates:    1:1 self care retraining at shower level. Pt with improve social communication demonstrating tack, topic maintenance, intellectual awareness, d/c planning and safety with RW  Therapy Documentation Precautions:  Precautions Precautions: Fall Precaution Comments: Dysphasia II diet, thin liquids Restrictions Weight Bearing Restrictions: No  Pain:  no c/o pain  See FIM for current functional status  Therapy/Group: Individual Therapy  Roney Mans [facility] 05/24/2012, 3:19 PM

## 2012-05-24 NOTE — Progress Notes (Signed)
Physical Therapy Note  Patient Details  Name: Wayne Buckley MRN: [id number] Date of Birth: 04/08/1951 Today's Date: 05/24/2012  Time: 1300-1345 45 minutes  No c/o pain.  Gait training in controlled and household environments with supervision with RW.  Pt continues to require cues to slow down cadence and for safety with RW.  Pt limited by poor selective attention, safety decreases in busy environment.  Standing NMR with wall squats, anterior wt shifts from wall with pt with much improved performance vs last week.  Pt able to perform with supervision.  Quadruped alt UE/LE lifts with supervision, pt with improved core strength, continues with B hip weakness.  Squat and reach activity for core control and balance with min A for balance reaching out of BOS.  Individual therapy   Bentlie Catanzaro 05/24/2012, 5:19 PM

## 2012-05-24 NOTE — Progress Notes (Signed)
Occupational Therapy Note  Patient Details  Name: Wayne Buckley MRN: [id number] Date of Birth: April 06, 1951 Today's Date: 05/24/2012  Time: 6962-9528 (cotx with Speech Therapy-group time [phone]) Pt denies pain Group Therapy Pt participated in self feeding group with focus on portion control, swallowing strategies, attention to task, and appropriate social interaction with group participants.  Pt required occasional verbal control for portion control.  Pt interacted appropriately with staff and participants.   [name] [facility] 05/24/2012, 3:10 PM

## 2012-05-24 NOTE — Progress Notes (Signed)
Social Work Patient ID: Wayne Buckley, male   DOB: 1950/09/13, 61 y.o.   MRN: [id number]   Met yesterday afternoon with pt's wife and daughter to provide TBI education and address any questions/ concerns they may be having.  [name], OT provided detailed review of our BI education book and of pt's current functional levels and projected recovery over longer term.  Daughter very quiet and reports she does not have any questions for myself or Clydie Braun.  Encouraged them both to keep communication open and ongoing as pt transitions back into their home next week.  Will continue to follow and offer support to pt and family.    Brindley Madarang, LCSW

## 2012-05-24 NOTE — Progress Notes (Signed)
Speech Language Pathology Daily Session Notes  Patient Details  Name: Wayne Buckley MRN: [id number] Date of Birth: 12-29-1950  Today's Date: 05/24/2012  Session 1 Time: 0800-0855 Time Calculation (min): 55 min  Session 2 Time: 1130-1145 Time Calculation: 15 min  Short Term Goals: Week 3: SLP Short Term Goal 1 (Week 3): Pt will utilize swallowing compensatory strategies with supervision question cues.  SLP Short Term Goal 2 (Week 3): Pt will demonstrate alternating attention in a group setting with Mod verbal cues for redirection.  SLP Short Term Goal 3 (Week 3): Pt will demonstrate functional problem solving for mildly complex problem solving tasks with supervision verbal cues.  SLP Short Term Goal 4 (Week 3): Pt will demonstrate emergent awareness of deficits with supervision question cues.  SLP Short Term Goal 5 (Week 3): Pt will utilize call bell to express wants/needs with Min A verbal cues.  SLP Short Term Goal 6 (Week 3): Pt will utilize word-finding strategies at the conversation level with supervision question cues.   Skilled Therapeutic Interventions:  Session 1: Treatment focus on cognitive and dysphagia goals. Pt consumed breakfast of Dys. 3 textures with thin liquids without s/s of aspiration and utilized swallowing compensatory strategies with verbal cue X 1. Pt changed to intermittent supervision for utilization of swallowing compensatory strategies.  Pt participated in language activity with focus on generative naming with abstract/complex categories. Pt performed the task with Mod semantic cues.   Session 2: Pt seen in [employer] with focus on dysphagia goals. Pt consumed Dys. 3 textures with thin liquids with cough X 1, suspect due to large bites and pt talking with food in his oral cavity. Pt able to verbalize reason for coughing episode with Mod I and self-correct through the remainder of the session.   FIM:  Comprehension Comprehension Mode: Auditory Comprehension:  5-Follows basic conversation/direction: With extra time/assistive device Expression Expression: 5-Expresses basic needs/ideas: With extra time/assistive device Social Interaction Social Interaction: 5-Interacts appropriately 90% of the time - Needs monitoring or encouragement for participation or interaction. Problem Solving Problem Solving: 5-Solves basic problems: With no assist Memory Memory: 4-Recognizes or recalls 75 - 89% of the time/requires cueing 10 - 24% of the time FIM - Eating Eating Activity: 5: Needs verbal cues/supervision  Pain Pain Assessment Pain Assessment: No/denies pain  Therapy/Group: Individual Therapy and Dysphagia Group  Lawrencia Mauney 05/24/2012, 3:30 PM

## 2012-05-24 NOTE — Progress Notes (Signed)
Physical Therapy Session Note  Patient Details  Name: Wayne Buckley MRN: [id number] Date of Birth: [birth year]  Today's Date: 05/24/2012 Time: 0900-1000 Time Calculation (min): 60 min    Skilled Therapeutic Interventions/Progress Updates: Patient transferred sit/stand from multiple surfaces to RW with supervision and occasional cuing for safe technique.  Patient continuee to demonstrate impulsivity during transfers.  Ambulation with RW and supervision with occasional cuing for upright posture and slower cadence ~500' x1, >200' x1 for improved endurance.  Patient performed reaching and bending activities at kitchen counter in ADL apartment using counter for UE support to improve functional dynamic standing balance.  Continued work on standing balance standing on compliant surface at activity table for 2 bouts of 5 minutes while engaged in bilateral UE activity with 5 minute rest between bouts.  Patient completed assembly of PVC puzzle x2 at activity table while standing at activity table.  All activity at activity table performed while engaged in conversation with visiting neighbor to increase challenge attention to task.  Patient ascended/descended 10 steps using 2 handrails x2 with supervision.  Returned to room at conclusion of session to recliner with safety belt in place.    Therapy Documentation Precautions:  Precautions Precautions: Fall Precaution Comments: Dysphasia II diet, thin liquids Restrictions Weight Bearing Restrictions: No   Pain: Pain Assessment Pain Assessment: No/denies pain     See FIM for current functional status  Therapy/Group: Individual Therapy  Newman Pies A 05/24/2012, 10:09 AM

## 2012-05-24 NOTE — Progress Notes (Signed)
Subjective/Complaints: RN reports no problems. Pt without complaints as well.  A 12 point review of systems has been performed and if not noted above is otherwise negative.   Objective: Vital Signs: Blood pressure 161/98, pulse 72, temperature 97.4 F (36.3 C), temperature source Oral, resp. rate 18, weight 76.794 kg (169 lb 4.8 oz), SpO2 96.00%. No results found.  Basename 05/23/12 0500  WBC 6.4  HGB 11.6*  HCT 34.6*  PLT 431*    Basename 05/23/12 0500  NA 142  K 4.5  CL 105  CO2 29  GLUCOSE 93  BUN 12  CREATININE 0.69  CALCIUM 9.7   CBG (last 3)  No results found for this basename: GLUCAP:3 in the last 72 hours  Wt Readings from Last 3 Encounters:  05/22/12 76.794 kg (169 lb 4.8 oz)  05/04/12 83.9 kg (184 lb 15.5 oz)  05/04/12 83.9 kg (184 lb 15.5 oz)    Physical Exam:  Constitutional: He appears well-developed and well-nourished.  HENT:  Head: Normocephalic.  Eyes: Pupils are equal, round, and reactive to light.  Neck: Normal range of motion.  Cardiovascular: Normal rate and regular rhythm.  Pulmonary/Chest: Effort normal and breath sounds normal.  Abdominal: Soft. Bowel sounds are normal.  Musculoskeletal: He exhibits no edema.  Neurological: He is alert.  Oriented to self and place today. Answers simple questions. Limited insight and and awareness.   Right sided weakness UE>LE--?motor apraxia RUE is  4/5. RLE is  Also 4/5.  Pain sense better on right. Speech more automatic. --identified multiple objects for me. Able to carry simple conversation. Some receptive and expressive deficits still but much improved.   Psychiatric:    calm, collected Skin: wound clean and intact, no drainage    Assessment/Plan: 1. 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 Physiatrist is providing close team supervision and 24 hour management of  active medical problems listed below. Physiatrist and rehab team continue to assess barriers to discharge/monitor patient progress toward functional and medical goals.        FIM: FIM - Bathing Bathing Steps Patient Completed: Chest;Right Arm;Right upper leg;Left upper leg;Left lower leg (including foot);Left Arm;Abdomen;Right lower leg (including foot);Front perineal area;Buttocks Bathing: 6: More than reasonable amount of time  FIM - Upper Body Dressing/Undressing Upper body dressing/undressing steps patient completed: Thread/unthread right sleeve of pullover shirt/dresss;Thread/unthread left sleeve of pullover shirt/dress;Put head through opening of pull over shirt/dress;Pull shirt over trunk Upper body dressing/undressing: 6: More than reasonable amount of time FIM - Lower Body Dressing/Undressing Lower body dressing/undressing steps patient completed: Thread/unthread right underwear leg;Pull underwear up/down;Thread/unthread right pants leg;Thread/unthread left underwear leg;Pull pants up/down;Thread/unthread left pants leg;Don/Doff right sock;Don/Doff left sock;Don/Doff left shoe;Don/Doff right shoe Lower body dressing/undressing: 5: Supervision: Safety issues/verbal cues  FIM - Toileting Toileting steps completed by patient: Adjust clothing prior to toileting;Adjust clothing after toileting;Performs perineal hygiene Toileting Assistive Devices: Grab bar or rail for support Toileting: 5: Supervision: Safety issues/verbal cues  FIM - Diplomatic Services operational officer Devices: Grab bars Toilet Transfers: 5-To toilet/BSC: Supervision (verbal cues/safety issues);5-From toilet/BSC: Supervision (verbal cues/safety issues)  FIM - Press photographer Assistive Devices: Arm rests Bed/Chair Transfer: 5: Supine > Sit: Supervision (verbal cues/safety issues);5: Sit > Supine: Supervision (verbal cues/safety issues);5: Bed > Chair or W/C: Supervision (verbal  cues/safety issues);5: Chair or W/C > Bed: Supervision (verbal cues/safety issues)  FIM - Locomotion: Wheelchair Distance: 45' Locomotion:  Wheelchair: 0: Activity did not occur FIM - Locomotion: Ambulation Locomotion: Ambulation Assistive Devices: Walker - Rolling;Other (comment) Ambulation/Gait Assistance: 4: Min assist Locomotion: Ambulation: 5: Travels 150 ft or more with supervision/safety issues  Comprehension Comprehension Mode: Auditory Comprehension: 5-Understands basic 90% of the time/requires cueing < 10% of the time  Expression Expression Mode: Verbal Expression: 5-Expresses complex 90% of the time/cues < 10% of the time  Social Interaction Social Interaction: 5-Interacts appropriately 90% of the time - Needs monitoring or encouragement for participation or interaction.  Problem Solving Problem Solving: 5-Solves complex 90% of the time/cues < 10% of the time  Memory Memory: 4-Recognizes or recalls 75 - 89% of the time/requires cueing 10 - 24% of the time  Medical Problem List and Plan:  1. DVT Prophylaxis/Anticoagulation: Mechanical: Sequential compression devices, below knee Bilateral lower extremities  2. Pain Management: denies any pain.  3. Mood: no signs of distress. Patient with significant cognitive deficits with poor awareness and impaired insight.  4. Neuropsych: This patient is not capable of making decisions on his/her own behalf.    5. ABLA:improved 12.1 6. RUE DVT: would not anticoagulate given location and recent surgery 7. Polysubstance abuse: outpt follow up 8. HTN: off BP meds at current time. Monitor with bid checks for trends.  9. Urine retention/neurogenic bladder: surprisingly continent 10. FEN: eating well. bmet today 11. Wound: staples out  LOS (Days) 17 A FACE TO FACE EVALUATION WAS PERFORMED  Jahayra Mazo T 05/24/2012, 7:27 AM

## 2012-05-25 ENCOUNTER — Inpatient Hospital Stay (HOSPITAL_COMMUNITY): Payer: 59 | Admitting: Speech Pathology

## 2012-05-25 ENCOUNTER — Inpatient Hospital Stay (HOSPITAL_COMMUNITY): Payer: 59 | Admitting: *Deleted

## 2012-05-25 NOTE — Progress Notes (Signed)
Speech Language Pathology Daily Session Note  Patient Details  Name: Wayne Buckley MRN: [id number] Date of Birth: [birth year]  Today's Date: 05/25/2012 Time: 1130-1200 Time Calculation (min): 30 min  Short Term Goals: Week 3: SLP Short Term Goal 1 (Week 3): Pt will utilize swallowing compensatory strategies with supervision question cues.  SLP Short Term Goal 2 (Week 3): Pt will demonstrate alternating attention in a group setting with Mod verbal cues for redirection.  SLP Short Term Goal 3 (Week 3): Pt will demonstrate functional problem solving for mildly complex problem solving tasks with supervision verbal cues.  SLP Short Term Goal 4 (Week 3): Pt will demonstrate emergent awareness of deficits with supervision question cues.  SLP Short Term Goal 5 (Week 3): Pt will utilize call bell to express wants/needs with Min A verbal cues.  SLP Short Term Goal 6 (Week 3): Pt will utilize word-finding strategies at the conversation level with supervision question cues.   Skilled Therapeutic Interventions: Pt seen in [employer] with focus on dysphagia goals. Pt consumed Dys. 3 textures with thin liquids with cough X 1 due to laughing with a full oral cavity. Pt able to verbalize reason for coughing episode with Mod I and self-corrected through the remainder of the session.    FIM:  Comprehension Comprehension Mode: Auditory Comprehension: 5-Follows basic conversation/direction: With extra time/assistive device Expression Expression Mode: Verbal Expression: 5-Expresses basic needs/ideas: With extra time/assistive device Social Interaction Social Interaction: 5-Interacts appropriately 90% of the time - Needs monitoring or encouragement for participation or interaction. Problem Solving Problem Solving: 5-Solves basic 90% of the time/requires cueing < 10% of the time Memory Memory: 4-Recognizes or recalls 75 - 89% of the time/requires cueing 10 - 24% of the time FIM - Eating Eating Activity: 5:  Needs verbal cues/supervision  Pain Pain Assessment Pain Assessment: No/denies pain  Therapy/Group: Dysphagia Group  Emerson Barretto 05/25/2012, 2:02 PM

## 2012-05-25 NOTE — Progress Notes (Signed)
Physical Therapy Note  Patient Details  Name: Wayne Buckley MRN: [id number] Date of Birth: 1951-06-06 Today's Date: 05/25/2012  1500-1555 (55 minutes) group Pain: no complaint of pain Pt participated in PT group session focused on gait training/safety/endurance. Pt ambulates X 4 min assist with RW 160 feet with tactile cues at shoulder to improve trunk extension.    Tasharra Nodine,JIM 05/25/2012, 7:46 AM

## 2012-05-25 NOTE — Progress Notes (Signed)
Occupational Therapy Session Note  Patient Details  Name: ARKEEM HARTS MRN: [id number] Date of Birth: 04-15-1951  Today's Date: 05/25/2012 Time: 1200-1220 Time Calculation (min): 20 min     Skilled Therapeutic Interventions/Progress Updates: d/c patient appropriate and only required one cue to slow down for safe self feeding and swallowing     Therapy Documentation Precautions:  Precautions Precautions: Fall Precaution Comments: Dysphasia II diet, thin liquids Restrictions Weight Bearing Restrictions: No General:       See FIM for current functional status  Therapy/Group: Group Therapy  Rozelle Logan 05/25/2012, 11:51 PM

## 2012-05-25 NOTE — Progress Notes (Signed)
Patient ID: Wayne Buckley, male   DOB: 05-28-1951, 61 y.o.   MRN: [id number] Subjective/Complaints: 12/7.  No concerns or complaints. Examination unchanged. Chest clear to auscultation. Cardiovascular normal heart sounds. No tachycardia. Abdomen soft nontender no distention. Extremities no edema.  A 12 point review of systems has been performed and if not noted above is otherwise negative.   Objective: Vital Signs: Blood pressure 149/86, pulse 91, temperature 98 F (36.7 C), temperature source Oral, resp. rate 18, weight 76.794 kg (169 lb 4.8 oz), SpO2 98.00%. No results found.  Basename 05/23/12 0500  WBC 6.4  HGB 11.6*  HCT 34.6*  PLT 431*    Basename 05/23/12 0500  NA 142  K 4.5  CL 105  CO2 29  GLUCOSE 93  BUN 12  CREATININE 0.69  CALCIUM 9.7   CBG (last 3)  No results found for this basename: GLUCAP:3 in the last 72 hours  Wt Readings from Last 3 Encounters:  05/22/12 76.794 kg (169 lb 4.8 oz)  05/04/12 83.9 kg (184 lb 15.5 oz)  05/04/12 83.9 kg (184 lb 15.5 oz)    Physical Exam:  Constitutional: He appears well-developed and well-nourished.  HENT:  Head: Normocephalic.  Eyes: Pupils are equal, round, and reactive to light.  Neck: Normal range of motion.  Cardiovascular: Normal rate and regular rhythm.  Pulmonary/Chest: Effort normal and breath sounds normal.  Abdominal: Soft. Bowel sounds are normal.  Musculoskeletal: He exhibits no edema.  Neurological: He is alert.  Oriented to self and place today. Answers simple questions. Limited insight and and awareness.   Right sided weakness UE>LE--?motor apraxia RUE is  4/5. RLE is  Also 4/5.  Pain sense better on right. Speech more automatic. --identified multiple objects for me. Able to carry simple conversation. Some receptive and expressive deficits still but much improved.   Psychiatric:    calm, collected Skin: wound clean and intact, no drainage    Assessment/Plan: 1. Functional deficits secondary to TBI  s/p left frontal lobectomy, crani which require 3+ hours per day of interdisciplinary therapy in a comprehensive inpatient rehab setting. Physiatrist is providing close team supervision and 24 hour management of active medical problems listed below. Physiatrist and rehab team continue to assess barriers to discharge/monitor patient progress toward functional and medical goals.        FIM: FIM - Bathing Bathing Steps Patient Completed: Chest;Right Arm;Right upper leg;Left upper leg;Left lower leg (including foot);Left Arm;Abdomen;Right lower leg (including foot);Front perineal area;Buttocks Bathing: 6: More than reasonable amount of time  FIM - Upper Body Dressing/Undressing Upper body dressing/undressing steps patient completed: Thread/unthread right sleeve of pullover shirt/dresss;Thread/unthread left sleeve of pullover shirt/dress;Put head through opening of pull over shirt/dress;Pull shirt over trunk Upper body dressing/undressing: 6: More than reasonable amount of time FIM - Lower Body Dressing/Undressing Lower body dressing/undressing steps patient completed: Thread/unthread right underwear leg;Pull underwear up/down;Thread/unthread right pants leg;Thread/unthread left underwear leg;Pull pants up/down;Thread/unthread left pants leg;Don/Doff right sock;Don/Doff left sock;Don/Doff left shoe;Don/Doff right shoe Lower body dressing/undressing: 5: Supervision: Safety issues/verbal cues  FIM - Toileting Toileting steps completed by patient: Adjust clothing prior to toileting;Performs perineal hygiene;Adjust clothing after toileting Toileting Assistive Devices: Grab bar or rail for support Toileting: 5: Supervision: Safety issues/verbal cues  FIM - Diplomatic Services operational officer Devices: Art gallery manager Transfers: 5-To toilet/BSC: Supervision (verbal cues/safety issues)  FIM - Banker Devices: Arm rests Bed/Chair Transfer: 5: Chair or  W/C > Bed: Supervision (verbal cues/safety issues);5: Bed > Chair or  W/C: Supervision (verbal cues/safety issues)  FIM - Locomotion: Wheelchair Distance: 45' Locomotion: Wheelchair: 0: Activity did not occur FIM - Locomotion: Ambulation Locomotion: Ambulation Assistive Devices: Designer, industrial/product Ambulation/Gait Assistance: 4: Min assist;5: Supervision Locomotion: Ambulation: 5: Travels 150 ft or more with supervision/safety issues  Comprehension Comprehension Mode: Auditory Comprehension: 5-Follows basic conversation/direction: With extra time/assistive device  Expression Expression Mode: Verbal Expression: 5-Expresses basic needs/ideas: With extra time/assistive device  Social Interaction Social Interaction: 5-Interacts appropriately 90% of the time - Needs monitoring or encouragement for participation or interaction.  Problem Solving Problem Solving: 5-Solves basic problems: With no assist  Memory Memory: 4-Recognizes or recalls 75 - 89% of the time/requires cueing 10 - 24% of the time  Medical Problem List and Plan:  1. DVT Prophylaxis/Anticoagulation: Mechanical: Sequential compression devices, below knee Bilateral lower extremities  2. Pain Management: denies any pain.  3. Mood: no signs of distress. Patient with significant cognitive deficits with poor awareness and impaired insight.  4. Neuropsych: This patient is not capable of making decisions on his/her own behalf.    5. ABLA:improved 12.1 6. RUE DVT: would not anticoagulate given location and recent surgery 7. Polysubstance abuse: outpt follow up 8. HTN: off BP meds at current time. Monitor with bid checks for trends.  9. Urine retention/neurogenic bladder: surprisingly continent 10. FEN: eating well. bmet today 11. Wound: staples out  LOS (Days) 18 A FACE TO FACE EVALUATION WAS PERFORMED  Rogelia Boga 05/25/2012, 9:39 AM

## 2012-05-25 NOTE — Progress Notes (Signed)
Occupational Therapy Session Note  Patient Details  Name: Wayne Buckley MRN: [id number] Date of Birth: [birth year]  Today's Date: 05/25/2012 Time: 1130-1200 Time Calculation (min): 30 min   Skilled Therapeutic Interventions/Progress Updates: d/club and required cues to slow down, eat smaller bites and clear pocketing; wife present for session     Therapy Documentation Precautions:  Precautions Precautions: Fall Precaution Comments: Dysphasia II diet, thin liquids Restrictions Weight Bearing Restrictions: No  See FIM for current functional status  Therapy/Group: Group Therapy  Rozelle Logan 05/25/2012, 11:54 PM

## 2012-05-26 ENCOUNTER — Inpatient Hospital Stay (HOSPITAL_COMMUNITY): Payer: 59 | Admitting: *Deleted

## 2012-05-26 NOTE — Progress Notes (Signed)
Patient ID: Wayne Buckley, male   DOB: 1950-09-01, 61 y.o.   MRN: [id number] Patient ID: NEMIAH Buckley, male   DOB: 02/08/1951, 61 y.o.   MRN: [id number] Subjective/Complaints: 12/8.   No concerns or complaints. Examination unchanged. Chest clear to auscultation. Cardiovascular normal heart sounds. No tachycardia. Abdomen soft nontender no distention. Extremities no edema. Neuro-moves all extremities well  A 12 point review of systems has been performed and if not noted above is otherwise negative.   Objective: Vital Signs: Blood pressure 131/82, pulse 85, temperature 97.8 F (36.6 C), temperature source Oral, resp. rate 18, weight 76.794 kg (169 lb 4.8 oz), SpO2 96.00%. No results found. No results found for this basename: WBC:2,HGB:2,HCT:2,PLT:2 in the last 72 hours No results found for this basename: NA:2,K:2,CL:2,CO2:2,GLUCOSE:2,BUN:2,CREATININE:2,CALCIUM:2 in the last 72 hours CBG (last 3)  No results found for this basename: GLUCAP:3 in the last 72 hours  Wt Readings from Last 3 Encounters:  05/22/12 76.794 kg (169 lb 4.8 oz)  05/04/12 83.9 kg (184 lb 15.5 oz)  05/04/12 83.9 kg (184 lb 15.5 oz)    Physical Exam:  Constitutional: He appears well-developed and well-nourished.  HENT:  Head: Normocephalic.  Eyes: Pupils are equal, round, and reactive to light.  Neck: Normal range of motion.  Cardiovascular: Normal rate and regular rhythm.  Pulmonary/Chest: Effort normal and breath sounds normal.  Abdominal: Soft. Bowel sounds are normal.  Musculoskeletal: He exhibits no edema.  Neurological: He is alert.  Oriented to self and place today. Answers simple questions. Limited insight and and awareness.   Right sided weakness UE>LE--?motor apraxia RUE is  4/5. RLE is  Also 4/5.  Pain sense better on right. Speech more automatic. --identified multiple objects for me. Able to carry simple conversation. Some receptive and expressive deficits still but much improved.   Psychiatric:    calm,  collected Skin: wound clean and intact, no drainage    Assessment/Plan: 1. 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 Physiatrist is providing close team supervision and 24 hour management of active medical problems listed below. Physiatrist and rehab team continue to assess barriers to discharge/monitor patient progress toward functional and medical goals.        FIM: FIM - Bathing Bathing Steps Patient Completed: Chest;Right Arm;Right upper leg;Left upper leg;Left lower leg (including foot);Left Arm;Abdomen;Right lower leg (including foot);Front perineal area;Buttocks Bathing: 6: More than reasonable amount of time  FIM - Upper Body Dressing/Undressing Upper body dressing/undressing steps patient completed: Thread/unthread right sleeve of pullover shirt/dresss;Thread/unthread left sleeve of pullover shirt/dress;Put head through opening of pull over shirt/dress;Pull shirt over trunk Upper body dressing/undressing: 6: More than reasonable amount of time FIM - Lower Body Dressing/Undressing Lower body dressing/undressing steps patient completed: Thread/unthread right underwear leg;Thread/unthread left underwear leg;Pull underwear up/down;Thread/unthread right pants leg;Thread/unthread left pants leg;Pull pants up/down Lower body dressing/undressing: 5: Supervision: Safety issues/verbal cues  FIM - Toileting Toileting steps completed by patient: Adjust clothing prior to toileting;Performs perineal hygiene;Adjust clothing after toileting Toileting Assistive Devices: Grab bar or rail for support Toileting: 5: Supervision: Safety issues/verbal cues  FIM - Diplomatic Services operational officer Devices: Art gallery manager Transfers: 5-To toilet/BSC: Supervision (verbal cues/safety issues)  FIM - Banker Devices: Arm  rests Bed/Chair Transfer: 5: Bed > Chair or W/C: Supervision (verbal cues/safety issues);5: Chair or W/C > Bed: Supervision (verbal cues/safety issues)  FIM - Locomotion: Wheelchair Distance:  45' Locomotion: Wheelchair: 0: Activity did not occur FIM - Locomotion: Ambulation Locomotion: Ambulation Assistive Devices: Designer, industrial/product Ambulation/Gait Assistance: 4: Min assist;5: Supervision Locomotion: Ambulation: 5: Travels 150 ft or more with supervision/safety issues  Comprehension Comprehension Mode: Auditory Comprehension: 5-Follows basic conversation/direction: With extra time/assistive device  Expression Expression Mode: Verbal Expression: 5-Expresses basic needs/ideas: With extra time/assistive device  Social Interaction Social Interaction: 5-Interacts appropriately 90% of the time - Needs monitoring or encouragement for participation or interaction.  Problem Solving Problem Solving: 5-Solves basic 90% of the time/requires cueing < 10% of the time  Memory Memory: 4-Recognizes or recalls 75 - 89% of the time/requires cueing 10 - 24% of the time  Medical Problem List and Plan:  1. DVT Prophylaxis/Anticoagulation: Mechanical: Sequential compression devices, below knee Bilateral lower extremities  2. Pain Management: denies any pain.  3. Mood: no signs of distress. Patient with significant cognitive deficits with poor awareness and impaired insight.  4. Neuropsych: This patient is not capable of making decisions on his/her own behalf.    5. ABLA:improved 12.1 6. RUE DVT: would not anticoagulate given location and recent surgery 7. Polysubstance abuse: outpt follow up 8. HTN: off BP meds at current time. Monitor with bid checks for trends.  9. Urine retention/neurogenic bladder: surprisingly continent 10. FEN: eating well. bmet today 11. Wound: staples out  LOS (Days) 19 A FACE TO FACE EVALUATION WAS PERFORMED  Rogelia Boga 05/26/2012, 8:34 AM

## 2012-05-26 NOTE — Progress Notes (Signed)
Physical Therapy Note  Patient Details  Name: Wayne Buckley MRN: [id number] Date of Birth: [birth year] Today's Date: 05/26/2012  1000-1055 (55 minutes) group Pain: no reported pain Pt participated in PT group session focused on gait training/safety/endurance Pt ambulates 120 feet X 2 with RW close SBA; 120 feet X 2 without AD min assist with tactile cues at shoulder to maintain erect standing (pt leans heavily on RW to maintain trunk extension).   Kamara Allan,JIM 05/26/2012, 7:25 AM

## 2012-05-26 NOTE — Plan of Care (Signed)
Problem: RH SAFETY Goal: RH STG ADHERE TO SAFETY PRECAUTIONS W/ASSISTANCE/DEVICE STG Adhere to Safety Precautions With mod Assistance/Device.  Outcome: Not Progressing Pt found coming out of bathroom without assistance despite reviewing safety precautions at beginning of shift.

## 2012-05-27 ENCOUNTER — Inpatient Hospital Stay (HOSPITAL_COMMUNITY): Payer: 59 | Admitting: Occupational Therapy

## 2012-05-27 ENCOUNTER — Inpatient Hospital Stay (HOSPITAL_COMMUNITY): Payer: 59 | Admitting: Physical Therapy

## 2012-05-27 ENCOUNTER — Inpatient Hospital Stay (HOSPITAL_COMMUNITY): Payer: 59

## 2012-05-27 DIAGNOSIS — F1010 Alcohol abuse, uncomplicated: Secondary | ICD-10-CM

## 2012-05-27 DIAGNOSIS — S069X9A Unspecified intracranial injury with loss of consciousness of unspecified duration, initial encounter: Secondary | ICD-10-CM

## 2012-05-27 DIAGNOSIS — W19XXXA Unspecified fall, initial encounter: Secondary | ICD-10-CM

## 2012-05-27 NOTE — Progress Notes (Signed)
Occupational Therapy Discharge Summary  Patient Details  Name: Wayne Buckley MRN: [id number] Date of Birth: 10/25/1950  Today's Date: 05/27/2012 Time: 0815-0900 Time Calculation (min): 45 min  1:1 GRAD DAY: 1:1 self care retraining at shower level. Focus on d/c planning, functional ambulation with memory recall to use RW for balance, reasoning, sequencing,  alternating attention; wife came in later in session: review d/c plans and reviewed earlier [facility] education.  Patient has met 13 of 13 long term goals due to improved activity tolerance, improved balance, postural control, functional use of  RIGHT upper and RIGHT lower extremity, improved attention, improved awareness and improved coordination.  Patient to discharge at overall Supervision level with all basic ADLs and basic mobility with RW and transfers; pt can participated in a community outing with supervision to min A with RW.  Patient's care partner is independent to provide the necessary physical and cognitive assistance at discharge.  Pt's behavior is consistent with [place] Level VII.   Reasons goals not met: n/a  Recommendation:  Patient will benefit from ongoing skilled OT services in outpatient setting to continue to advance functional skills in the area of BADL, iADL and Vocation.  Equipment: No equipment provided Pt's wife got a shower chair already  Reasons for discharge: treatment goals met and discharge from hospital  Patient/family agrees with progress made and goals achieved: Yes  OT Discharge Precautions/Restrictions  Precautions Precautions: Fall Restrictions Weight Bearing Restrictions: No Pain Pain Assessment Pain Assessment: No/denies pain Pain Score: 0-No pain ADL  see fIM Vision/Perception  Vision - History Baseline Vision: Wears glasses all the time Patient Visual Report: No change from baseline Vision - Assessment Eye Alignment: Within Functional Limits Perception Perception: Within Functional  Limits Praxis Praxis-Other Comments: decreased smoothness of movement  Cognition Overall Cognitive Status: Impaired Arousal/Alertness: Awake/alert Orientation Level: Oriented X4 Attention: Alternating Focused Attention: Appears intact Sustained Attention: Appears intact Selective Attention: Appears intact Alternating Attention: Impaired Alternating Attention Impairment: Verbal basic;Functional basic Memory: Impaired Memory Impairment: Decreased recall of new information;Storage deficit;Retrieval deficit;Decreased short term memory Awareness: Impaired Awareness Impairment: Emergent impairment Problem Solving: Impaired Problem Solving Impairment: Verbal basic;Functional basic Executive Function: Decision Making;Self Monitoring;Self Correcting Reasoning: Impaired Reasoning Impairment: Verbal basic;Functional basic Sequencing: Appears intact Sequencing Impairment: Verbal basic Organizing: Impaired Organizing Impairment: Verbal basic;Functional basic Decision Making: Impaired Decision Making Impairment: Verbal complex;Functional complex Initiating: Appears intact Self Monitoring: Impaired Self Monitoring Impairment: Verbal basic;Functional basic Self Correcting: Impaired Self Correcting Impairment: Verbal basic;Functional basic Behaviors: Impulsive Safety/Judgment: Impaired [place] Mirant Scales of Cognitive Functioning: Automatic/appropriate Sensation Sensation Light Touch: Appears Intact Stereognosis: Appears Intact Hot/Cold: Appears Intact Proprioception: Appears Intact Coordination Gross Motor Movements are Fluid and Coordinated: No Coordination and Movement Description: improved Motor  Motor Motor: Motor apraxia;Abnormal postural alignment and control Motor - Skilled Clinical Observations: decreased smoothness of movement grossly in standing Mobility  Bed Mobility Bed Mobility: Rolling Right;Rolling Left;Supine to Sit;Sit to Supine Rolling Right: 6: Modified  independent (Device/Increase time) Rolling Left: 6: Modified independent (Device/Increase time) Supine to Sit: 6: Modified independent (Device/Increase time) Sit to Supine: 6: Modified independent (Device/Increase time) Transfers Sit to Stand: 5: Supervision Stand to Sit: 5: Supervision  Trunk/Postural Assessment  Cervical Assessment Cervical Assessment: Within Functional Limits Thoracic Assessment Thoracic Assessment: Within Functional Limits Lumbar Assessment Lumbar Assessment: Within Functional Limits Postural Control Postural Control: Within Functional Limits  Balance Static Sitting Balance Static Sitting - Balance Support: Feet supported Static Sitting - Level of Assistance: 7: Independent Dynamic Sitting Balance Dynamic Sitting -  Balance Support: Feet supported Dynamic Sitting - Level of Assistance: 7: Independent Static Standing Balance Static Standing - Balance Support: During functional activity Static Standing - Level of Assistance: 5: Stand by assistance Extremity/Trunk Assessment RUE Assessment RUE Assessment: Within Functional Limits (5/5) LUE Assessment LUE Assessment: Within Functional Limits (5/5)  See FIM for current functional status  Roney Mans [facility] 05/27/2012, 11:41 AM

## 2012-05-27 NOTE — Progress Notes (Signed)
Recreational Therapy Session Note  Patient Details  Name: TRITON HEIDRICH MRN: [id number] Date of Birth: 1950-10-24 Today's Date: 05/27/2012 Pain: no c/o  Skilled Therapeutic Interventions/Progress Updates: Pt participated in animal assisted activity bed level with supervision, min cues for safety during interaction with pet therapy dog. Sherrin Stahle 05/27/2012, 2:59 PM

## 2012-05-27 NOTE — Progress Notes (Signed)
Physical Therapy Note  Patient Details  Name: Wayne Buckley MRN: [id number] Date of Birth: 1950/12/04 Today's Date: 05/27/2012  1:45 - 2:15 30 minutes Individual Session Patient denies pain.  Patient sitting edge of bed upon entering room. Patient donned shoes independently. Patient supine to sit without assistance with bed rails and head of bed elevated. Patient ambulated with rolling walker 300+ feet x 4 with supervision on level tile and carpet. Patient ambulated up and down 20 steps with bilateral rails and supervision. Patient transferred into and out of car with supervision and verbal cues to put bottom in first. Patient worked on dynamic standing balance folding towels in standing x 5 minutes. Patient doffed socks and shoes independently. Patient sit to supine without assistance using bed rails.    Arelia Longest M 05/27/2012, 2:39 PM

## 2012-05-27 NOTE — Progress Notes (Signed)
Speech Language Pathology Daily Session Note  Patient Details  Name: Wayne Buckley MRN: [id number] Date of Birth: September 24, 1950  Today's Date: 05/27/2012 Time: 1345-1415 Time Calculation (min): 30 min  Short Term Goals: Week 3: SLP Short Term Goal 1 (Week 3): Pt will utilize swallowing compensatory strategies with supervision question cues.  SLP Short Term Goal 1 - Progress (Week 3): Progressing toward goal SLP Short Term Goal 2 (Week 3): Pt will demonstrate alternating attention in a group setting with Mod verbal cues for redirection.  SLP Short Term Goal 2 - Progress (Week 3): Progressing toward goal SLP Short Term Goal 3 (Week 3): Pt will demonstrate functional problem solving for mildly complex problem solving tasks with supervision verbal cues.  SLP Short Term Goal 3 - Progress (Week 3): Progressing toward goal SLP Short Term Goal 4 (Week 3): Pt will demonstrate emergent awareness of deficits with supervision question cues.  SLP Short Term Goal 4 - Progress (Week 3): Progressing toward goal SLP Short Term Goal 5 (Week 3): Pt will utilize call bell to express wants/needs with Min A verbal cues.  SLP Short Term Goal 5 - Progress (Week 3): Progressing toward goal SLP Short Term Goal 6 (Week 3): Pt will utilize word-finding strategies at the conversation level with supervision question cues.  SLP Short Term Goal 6 - Progress (Week 3): Progressing toward goal  Skilled Therapeutic Interventions: Treatment focused on cognitive facilitation.  Pt. able to alternate attention between various topics of conversation with supervision cues.  Pt. required mild-moderate verbal cues during divergent thinking activity to facilitate word finding and discussed strategies ( use of alphabet).  He demonstrated appropriate emergent awareness by acknowledging mild difficulty during this activity.     FIM:  Comprehension Comprehension Mode: Auditory Comprehension: 5-Follows basic conversation/direction: With extra  time/assistive device Expression Expression Mode: Verbal Expression: 5-Expresses basic 90% of the time/requires cueing < 10% of the time. Social Interaction Social Interaction: 6-Interacts appropriately with others with medication or extra time (anti-anxiety, antidepressant). Problem Solving Problem Solving: 5-Solves basic 90% of the time/requires cueing < 10% of the time Memory Memory: 4-Recognizes or recalls 75 - 89% of the time/requires cueing 10 - 24% of the time FIM - Eating Eating Activity: 6: More than reasonable amount of time  Pain Pain Assessment Pain Assessment: No/denies pain  Therapy/Group: Individual Therapy  Royce Macadamia 05/27/2012, 3:26 PM

## 2012-05-27 NOTE — Progress Notes (Signed)
Physical Therapy Session Note  Patient Details  Name: Wayne Buckley MRN: [id number] Date of Birth: 11-22-1950  Today's Date: 05/27/2012 Time: 4098-1191 Time Calculation (min): 45 min  Skilled Therapeutic Interventions/Progress Updates:    Pt treatment focused on gait, strengthening and endurance. Performed Nustep level 6 5 minutes x 2 reps.  Ambulated with and without AD in hospital environment on both carpet and tile.  Pt needs min assist without device and supervision with RW.  Performed standing B LE strengthening exercises.  Therapy Documentation Precautions:  Precautions Precautions: Fall Precaution Comments: Dysphasia II diet, thin liquids Restrictions Weight Bearing Restrictions: No   Pain: Pain Assessment Pain Assessment: No/denies pain Pain Score: 0-No pain  See FIM for current functional status  Therapy/Group: Individual Therapy  Newell Coral 05/27/2012, 12:53 PM

## 2012-05-27 NOTE — Progress Notes (Signed)
Speech Language Pathology Daily Session Note  Patient Details  Name: Wayne Buckley MRN: [id number] Date of Birth: 1951-02-20  Today's Date: 05/27/2012 Time: 1200-1215 Time Calculation (min): 15 min  Short Term Goals: Week 3: SLP Short Term Goal 1 (Week 3): Pt will utilize swallowing compensatory strategies with supervision question cues.  SLP Short Term Goal 1 - Progress (Week 3): Progressing toward goal SLP Short Term Goal 2 (Week 3): Pt will demonstrate alternating attention in a group setting with Mod verbal cues for redirection.  SLP Short Term Goal 2 - Progress (Week 3): Progressing toward goal SLP Short Term Goal 3 (Week 3): Pt will demonstrate functional problem solving for mildly complex problem solving tasks with supervision verbal cues.  SLP Short Term Goal 3 - Progress (Week 3): Progressing toward goal SLP Short Term Goal 4 (Week 3): Pt will demonstrate emergent awareness of deficits with supervision question cues.  SLP Short Term Goal 4 - Progress (Week 3): Progressing toward goal SLP Short Term Goal 5 (Week 3): Pt will utilize call bell to express wants/needs with Min A verbal cues.  SLP Short Term Goal 5 - Progress (Week 3): Progressing toward goal SLP Short Term Goal 6 (Week 3): Pt will utilize word-finding strategies at the conversation level with supervision question cues.  SLP Short Term Goal 6 - Progress (Week 3): Progressing toward goal  Skilled Therapeutic Interventions: Pt. seen in [employer].  He required mod I cues to recall and implement swallow precautions.  No s/s aspiration observed during session.  Pt. exhibited alternating attention throughout group with supervision.   FIM:  Comprehension Comprehension: 5-Follows basic conversation/direction: With extra time/assistive device Expression Expression Mode: Verbal Expression: 5-Expresses basic 90% of the time/requires cueing < 10% of the time. Social Interaction Social Interaction: 6-Interacts appropriately  with others with medication or extra time (anti-anxiety, antidepressant). Problem Solving Problem Solving: 5-Solves basic 90% of the time/requires cueing < 10% of the time Memory Memory: 4-Recognizes or recalls 75 - 89% of the time/requires cueing 10 - 24% of the time FIM - Eating Eating Activity: 6: More than reasonable amount of time  Pain Pain Assessment Pain Assessment: No/denies pain  Therapy/Group: Group Therapy  Royce Macadamia 05/27/2012, 3:38 PM

## 2012-05-27 NOTE — Progress Notes (Signed)
Subjective/Complaints: Up oob without assist. Stated he wanted to brush his teeth.  A 12 point review of systems has been performed and if not noted above is otherwise negative.   Objective: Vital Signs: Blood pressure 144/92, pulse 87, temperature 98.2 F (36.8 C), temperature source Oral, resp. rate 18, weight 76.794 kg (169 lb 4.8 oz), SpO2 97.00%. No results found. No results found for this basename: WBC:2,HGB:2,HCT:2,PLT:2 in the last 72 hours No results found for this basename: NA:2,K:2,CL:2,CO2:2,GLUCOSE:2,BUN:2,CREATININE:2,CALCIUM:2 in the last 72 hours CBG (last 3)  No results found for this basename: GLUCAP:3 in the last 72 hours  Wt Readings from Last 3 Encounters:  05/22/12 76.794 kg (169 lb 4.8 oz)  05/04/12 83.9 kg (184 lb 15.5 oz)  05/04/12 83.9 kg (184 lb 15.5 oz)    Physical Exam:  Constitutional: He appears well-developed and well-nourished.  HENT:  Head: Normocephalic.  Eyes: Pupils are equal, round, and reactive to light.  Neck: Normal range of motion.  Cardiovascular: Normal rate and regular rhythm.  Pulmonary/Chest: Effort normal and breath sounds normal.  Abdominal: Soft. Bowel sounds are normal.  Musculoskeletal: He exhibits no edema.  Neurological: He is alert.  Oriented to self and place today. Answers simple questions. Limited insight and and awareness.   Right sided weakness UE>LE--?motor apraxia RUE is  4/5. RLE is  Also 4/5.  Pain sense better on right. Speech more automatic. --identified multiple objects for me. Able to carry simple conversation. Some receptive and expressive deficits still but much improved.   Psychiatric:    calm, collected Skin: wound clean and intact, no drainage    Assessment/Plan: 1. 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 Physiatrist is providing close team supervision and 24 hour  management of active medical problems listed below. Physiatrist and rehab team continue to assess barriers to discharge/monitor patient progress toward functional and medical goals.    finalize dc planning    FIM: FIM - Bathing Bathing Steps Patient Completed: Chest;Right Arm;Right upper leg;Left upper leg;Left lower leg (including foot);Left Arm;Abdomen;Right lower leg (including foot);Front perineal area;Buttocks Bathing: 6: More than reasonable amount of time  FIM - Upper Body Dressing/Undressing Upper body dressing/undressing steps patient completed: Thread/unthread right sleeve of pullover shirt/dresss;Thread/unthread left sleeve of pullover shirt/dress;Put head through opening of pull over shirt/dress;Pull shirt over trunk Upper body dressing/undressing: 6: More than reasonable amount of time FIM - Lower Body Dressing/Undressing Lower body dressing/undressing steps patient completed: Thread/unthread right underwear leg;Thread/unthread left underwear leg;Pull underwear up/down;Thread/unthread right pants leg;Thread/unthread left pants leg;Pull pants up/down Lower body dressing/undressing: 5: Supervision: Safety issues/verbal cues  FIM - Toileting Toileting steps completed by patient: Adjust clothing prior to toileting;Performs perineal hygiene;Adjust clothing after toileting Toileting Assistive Devices: Grab bar or rail for support Toileting: 5: Supervision: Safety issues/verbal cues  FIM - Diplomatic Services operational officer Devices: Art gallery manager Transfers: 5-To toilet/BSC: Supervision (verbal cues/safety issues)  FIM - Banker Devices: Arm rests Bed/Chair Transfer: 5: Bed > Chair or W/C: Supervision (verbal cues/safety issues);5: Chair or W/C > Bed: Supervision (verbal cues/safety issues)  FIM - Locomotion: Wheelchair Distance: 45' Locomotion: Wheelchair: 0: Activity did not occur FIM - Locomotion: Ambulation Locomotion:  Ambulation Assistive Devices: Designer, industrial/product Ambulation/Gait Assistance: 4: Min assist;5: Supervision Locomotion: Ambulation: 5: Travels 150 ft or more with supervision/safety issues  Comprehension Comprehension Mode: Auditory Comprehension: 5-Follows basic conversation/direction: With extra time/assistive device  Expression Expression Mode: Verbal Expression: 5-Expresses basic needs/ideas: With extra time/assistive device  Social Interaction Social Interaction: 5-Interacts appropriately 90% of the time - Needs monitoring or encouragement for participation or interaction.  Problem Solving Problem Solving: 5-Solves basic 90% of the time/requires cueing < 10% of the time  Memory Memory: 4-Recognizes or recalls 75 - 89% of the time/requires cueing 10 - 24% of the time  Medical Problem List and Plan:  1. DVT Prophylaxis/Anticoagulation: Mechanical: Sequential compression devices, below knee Bilateral lower extremities  2. Pain Management: denies any pain.  3. Mood: no signs of distress. Patient with significant cognitive deficits with poor awareness and impaired insight.  4. Neuropsych: This patient is not capable of making decisions on his/her own behalf.    5. ABLA:improved 12.1 6. RUE DVT: would not anticoagulate given location and recent surgery 7. Polysubstance abuse: outpt follow up 8. HTN: off BP meds at current time. Monitor with bid checks for trends.  9. Urine retention/neurogenic bladder: surprisingly continent 10. FEN: eating well. 11. Wound: staples out  LOS (Days) 20 A FACE TO FACE EVALUATION WAS PERFORMED  SWARTZ,ZACHARY T 05/27/2012, 7:44 AM

## 2012-05-27 NOTE — Progress Notes (Signed)
Recreational Therapy Discharge Summary Patient Details  Name: Wayne Buckley MRN: [id number] Date of Birth: April 27, 1951 Today's Date: 05/27/2012  Long term goals set: 2  Long term goals met: 2  Comments on progress toward goals: Pt has made excellent progress toward goals and is ready for discharge home tomorrow with 24 hour supervision.  Pt continues to be limited by decreased cognition/safety awareness effecting safe completion of leisure activities & community pursuits.  Reasons for discharge: discharge from hospital Patient/family agrees with progress made and goals achieved: Yes  [name] 05/27/2012, 4:49 PM

## 2012-05-27 NOTE — Progress Notes (Signed)
Occupational Therapy Session Note  Patient Details  Name: KEEVAN WOLZ MRN: [id number] Date of Birth: November 26, 1950  Today's Date: 05/27/2012 Time: 1130-1200 Time Calculation (min): 30 min  Skilled Therapeutic Interventions/Progress Updates:    Pt seen in [employer] today with focus on self feeding, attention and swallowing strategies. Pt was overall Mod I throughout session today, he did require increased time for tasks.  Therapy Documentation Precautions:  Precautions Precautions: Fall Precaution Comments: Dysphasia II diet, thin liquids Restrictions Weight Bearing Restrictions: No     Pain: Pain Assessment Pain Assessment: No/denies pain Pain Score: 0-No pain       See FIM for current functional status  Therapy/Group: Group Therapy  Charletta Cousin, Amy Beth Dixon 05/27/2012, 12:43 PM

## 2012-05-27 NOTE — Progress Notes (Signed)
Physical Therapy Note  Patient Details  Name: Wayne Buckley MRN: [id number] Date of Birth: 09-17-1950 Today's Date: 05/27/2012  4098-1191 (45 minutes) individual Pain: no complaint of pain Focus of treatment: Review goals for DC; gait training with/without AD; standing balance/allignment exercises Treatment: Gait 150 feet RW close SBA X 2 with intermittent vcs for hand placement sit to stand (no pulling up in AD); gait without AD min assist with tactile cues at shoulders to facilitate hip/trunk extension; tall kneeling 2 X 3 minutes; standing performing PNF patterns with yellow weighted ball for trunk strengthening; up/down 2-3 steps with bilateral rails close SBA step to step or step over step.    Sofiah Lyne,JIM 05/27/2012, 7:19 AM

## 2012-05-28 MED ORDER — THIAMINE HCL 250 MG PO TABS: 250.0000 mg | ORAL_TABLET | Freq: Every day | ORAL | Status: DC

## 2012-05-28 MED ORDER — PANTOPRAZOLE SODIUM 40 MG PO TBEC: 40.0000 mg | DELAYED_RELEASE_TABLET | Freq: Every day | ORAL | Status: DC

## 2012-05-28 MED ORDER — ACETAMINOPHEN 325 MG PO TABS: 325.0000 mg | ORAL_TABLET | ORAL | Status: DC | PRN

## 2012-05-28 MED ORDER — FOLIC ACID 1 MG PO TABS: 1.0000 mg | ORAL_TABLET | Freq: Every day | ORAL | Status: DC

## 2012-05-28 NOTE — Plan of Care (Signed)
Problem: RH BLADDER ELIMINATION Goal: RH STG MANAGE BLADDER WITH ASSISTANCE STG Manage Bladder With mod independence  Outcome: Not Met (add Reason) Night shift RN reports patient incontinent at hs and unable to manage bladder independently.

## 2012-05-28 NOTE — Progress Notes (Signed)
Patient discharged at 1122 with family . Discharged instructions given and reviewed with patient and wife by P. Love PA . Patient and wife verbalized understanding of discharge instructions . Continue with plan of care .     Wayne Buckley

## 2012-05-28 NOTE — Progress Notes (Signed)
Social Work  Discharge Note  The overall goal for the admission was met for:   Discharge location: Yes - home with wife and daughter  Length of Stay: Yes - 21 days  Discharge activity level: Yes - supervision overall  Home/community participation: Yes  Services provided included: MD, RD, PT, OT, SLP, RN, TR, Pharmacy, Neuropsych and SW  Financial Services: Private Insurance: [facility]  Follow-up services arranged: Outpatient: PT, OT, ST via [facility], DME: 18x18 Breezy w/c and rolling walker via [facility] and Patient/Family has no preference for HH/DME agencies  Comments (or additional information): Provided information on local TBI Support Group and online resources  Patient/Family verbalized understanding of follow-up arrangements: Yes  Individual responsible for coordination of the follow-up plan: wife  Confirmed correct DME delivered: [name] 05/28/2012    Aidyn Sportsman

## 2012-05-28 NOTE — Progress Notes (Signed)
Subjective/Complaints: No complaints. Rested well  A 12 point review of systems has been performed and if not noted above is otherwise negative.   Objective: Vital Signs: Blood pressure 106/74, pulse 85, temperature 97.7 F (36.5 C), temperature source Oral, resp. rate 18, weight 76.794 kg (169 lb 4.8 oz), SpO2 100.00%. No results found. No results found for this basename: WBC:2,HGB:2,HCT:2,PLT:2 in the last 72 hours No results found for this basename: NA:2,K:2,CL:2,CO2:2,GLUCOSE:2,BUN:2,CREATININE:2,CALCIUM:2 in the last 72 hours CBG (last 3)  No results found for this basename: GLUCAP:3 in the last 72 hours  Wt Readings from Last 3 Encounters:  05/22/12 76.794 kg (169 lb 4.8 oz)  05/04/12 83.9 kg (184 lb 15.5 oz)  05/04/12 83.9 kg (184 lb 15.5 oz)    Physical Exam:  Constitutional: He appears well-developed and well-nourished.  HENT:  Head: Normocephalic.  Eyes: Pupils are equal, round, and reactive to light.  Neck: Normal range of motion.  Cardiovascular: Normal rate and regular rhythm.  Pulmonary/Chest: Effort normal and breath sounds normal.  Abdominal: Soft. Bowel sounds are normal.  Musculoskeletal: He exhibits no edema.  Neurological: He is alert.  Oriented to self and place today. Answers simple questions. Limited insight and and awareness.    -?motor apraxia RUE is  4/5. RLE is  Also 4/5.  Pain sense better on right. Speech more automatic. --identified multiple objects for me. Able to carry simple conversation. Some receptive and expressive deficits still but much improved.   Psychiatric:    calm, collected Skin: wound clean and intact, no drainage    Assessment/Plan: 1. 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 Physiatrist is providing close team supervision and 24 hour management of active medical problems listed below. Physiatrist  and rehab team continue to assess barriers to discharge/monitor patient progress toward functional and medical goals.    dc today!!!    FIM: FIM - Bathing Bathing Steps Patient Completed: Chest;Right Arm;Left Arm;Abdomen;Left upper leg;Right upper leg;Left lower leg (including foot);Right lower leg (including foot);Front perineal area;Buttocks Bathing: 5: Set-up assist to: Adjust water temp  FIM - Upper Body Dressing/Undressing Upper body dressing/undressing steps patient completed: Thread/unthread right sleeve of pullover shirt/dresss;Thread/unthread left sleeve of pullover shirt/dress;Put head through opening of pull over shirt/dress;Pull shirt over trunk Upper body dressing/undressing: 6: More than reasonable amount of time FIM - Lower Body Dressing/Undressing Lower body dressing/undressing steps patient completed: Thread/unthread right underwear leg;Thread/unthread left underwear leg;Pull underwear up/down;Thread/unthread right pants leg;Thread/unthread left pants leg;Pull pants up/down;Don/Doff left sock;Don/Doff right shoe;Don/Doff left shoe;Don/Doff right sock;Fasten/unfasten pants Lower body dressing/undressing: 5: Set-up assist to: Obtain clothing  FIM - Toileting Toileting steps completed by patient: Adjust clothing prior to toileting;Performs perineal hygiene;Adjust clothing after toileting Toileting Assistive Devices: Grab bar or rail for support Toileting: 5: Supervision: Safety issues/verbal cues  FIM - Diplomatic Services operational officer Devices: Art gallery manager Transfers: 5-To toilet/BSC: Supervision (verbal cues/safety issues);5-From toilet/BSC: Supervision (verbal cues/safety issues)  FIM - Bed/Chair Transfer Bed/Chair Transfer Assistive Devices: Bed rails;HOB elevated Bed/Chair Transfer: 6: Supine > Sit: No assist;6: Sit > Supine: No assist;5: Bed > Chair or W/C: Supervision (verbal cues/safety issues);5: Chair or W/C > Bed: Supervision (verbal cues/safety  issues)  FIM - Locomotion: Wheelchair Distance: 150 Locomotion: Wheelchair: 5: Travels 150 ft or more: maneuvers on rugs and over door sills with supervision, cueing or coaxing FIM - Locomotion: Ambulation Locomotion: Ambulation Assistive Devices: Designer, industrial/product Ambulation/Gait Assistance:  5: Supervision Locomotion: Ambulation: 5: Travels 150 ft or more with supervision/safety issues  Comprehension Comprehension Mode: Auditory Comprehension: 5-Follows basic conversation/direction: With extra time/assistive device  Expression Expression Mode: Verbal Expression: 5-Expresses basic needs/ideas: With extra time/assistive device  Social Interaction Social Interaction: 5-Interacts appropriately 90% of the time - Needs monitoring or encouragement for participation or interaction.  Problem Solving Problem Solving: 5-Solves basic 90% of the time/requires cueing < 10% of the time  Memory Memory: 4-Recognizes or recalls 75 - 89% of the time/requires cueing 10 - 24% of the time  Medical Problem List and Plan:  1. DVT Prophylaxis/Anticoagulation: Mechanical: Sequential compression devices, below knee Bilateral lower extremities  2. Pain Management: denies any pain.  3. Mood: no signs of distress. Patient with significant cognitive deficits with poor awareness and impaired insight.  4. Neuropsych: This patient is not capable of making decisions on his/her own behalf.    5. ABLA:improved 12.1 6. RUE DVT: would not anticoagulate given location and recent surgery 7. Polysubstance abuse: outpt follow up 8. HTN: off BP meds at current time. Monitor with bid checks for trends.  9. Urine retention/neurogenic bladder: surprisingly continent 10. FEN: eating well. 11. Wound: staples out  LOS (Days) 21 A FACE TO FACE EVALUATION WAS PERFORMED  Vickee Mormino T 05/28/2012, 7:59 AM

## 2012-05-28 NOTE — Progress Notes (Signed)
Speech Language Pathology Discharge Summary  Patient Details  Name: Wayne Buckley MRN: [id number] Date of Birth: [birth year]  Patient has met 6 of 6 long term goals.  Patient to discharge at overall Supervision level.   Reasons goals not met: N/A   Clinical Impression/Discharge Summary: Pt has made functional gains and has met 6 out of 6 LTGs. Currently, pt is demonstrating behaviors consistent with a Rancho Level VII and is overall supervision-Min A for complex problem solving, anticipatory awareness, working memory with utilization of strategies and alternating attention.  Pt is demonstrating increased expression of wants/needs but demonstrates mild word-finding difficulties with complex and abstract thoughts. Pt is currently consuming Dys. 3 textures with thin liquids with intermittent supervision for utilization of swallowing compensatory strategies. Pt/family education complete. Pt would benefit from f/u outpatient skilled SLP intervention to maximize cognitive recovery, functional communication and swallowing function with least restrictive diet.   Care Partner:  Caregiver Able to Provide Assistance: Yes  Type of Caregiver Assistance: Physical;Cognitive  Recommendation:  Outpatient SLP  Rationale for SLP Follow Up: Maximize cognitive function and independence;Maximize swallowing safety;Maximize functional communication   Equipment: N/A   Reasons for discharge: Discharged from hospital;Treatment goals met   Patient/Family Agrees with Progress Made and Goals Achieved: Yes   See FIM for current functional status  Thom Ollinger 05/28/2012, 4:01 PM

## 2012-05-29 NOTE — Progress Notes (Signed)
Physical Therapy Discharge Summary  Patient Details  Name: Wayne Buckley MRN: [id number] Date of Birth: Oct 24, 1950  Today's Date: 05/29/2012  Patient has met 9 of 9 long term goals due to improved activity tolerance, improved balance, improved postural control, ability to compensate for deficits and improved attention.  Patient to discharge at an ambulatory level Supervision.   Patient's care partner is independent to provide the necessary supervision assistance at discharge.  Reasons goals not met: n/a  Recommendation:  Patient will benefit from ongoing skilled PT services in outpatient setting to continue to advance safe functional mobility, address ongoing impairments in balance, awareness, gait, and minimize fall risk.  Equipment: RW  Reasons for discharge: treatment goals met and discharge from hospital  Patient/family agrees with progress made and goals achieved: Yes  See FIM for current functional status  Clemencia Helzer 05/29/2012, 3:59 PM

## 2012-05-30 ENCOUNTER — Ambulatory Visit: Payer: 59 | Admitting: Speech Pathology

## 2012-05-30 ENCOUNTER — Ambulatory Visit: Payer: 59 | Attending: Physical Medicine & Rehabilitation | Admitting: Physical Therapy

## 2012-05-30 DIAGNOSIS — R41840 Attention and concentration deficit: Secondary | ICD-10-CM | POA: Insufficient documentation

## 2012-05-30 DIAGNOSIS — R41841 Cognitive communication deficit: Secondary | ICD-10-CM | POA: Insufficient documentation

## 2012-05-30 DIAGNOSIS — Z5189 Encounter for other specified aftercare: Secondary | ICD-10-CM | POA: Insufficient documentation

## 2012-06-03 ENCOUNTER — Ambulatory Visit: Payer: 59 | Admitting: Occupational Therapy

## 2012-06-04 ENCOUNTER — Ambulatory Visit: Payer: 59 | Admitting: Physical Therapy

## 2012-06-04 NOTE — Progress Notes (Signed)
Discharge summary # [phone]

## 2012-06-05 ENCOUNTER — Encounter: Payer: 59 | Admitting: Occupational Therapy

## 2012-06-05 NOTE — Discharge Summary (Signed)
Wayne Buckley NO.:  [id number]  MEDICAL RECORD NO.:  [id number]  LOCATION:  4025                         FACILITY:  [facility]  PHYSICIAN:  Wayne Buckley, M.D.DATE OF BIRTH:  03-15-1951  DATE OF ADMISSION:  05/07/2012 DATE OF DISCHARGE:  05/28/2012                              DISCHARGE SUMMARY   DISCHARGE DIAGNOSES: 1. Traumatic brain injury, status post left frontal lobectomy. 2. Acute blood loss anemia. 3. Right upper extremity deep venous thrombosis. 4. Polysubstance abuse. 5. Hypertension. 6. Urinary retention.  HISTORY OF PRESENT ILLNESS:  [name] is a 61 year old male who was found down comatose on April 28, 2012.  UDS positive for THC and alcohol at 121.  Workup showed left frontal lobe intraparenchymal hemorrhage, left frontal subdural hemorrhage, left subarachnoid hemorrhage, left-to-right midline shift with suspicion of early uncal herniation.  He required left craniotomy for evaluation of subdural hemorrhage as well as partial left frontal lobectomy at the same day by Dr. Gerlene Buckley.  Postop, required IV antibiotics for left lower lobe HAP. He was extubated on May 02, 2012, noted to have right upper extremity axillary DVT.  No blood thinners or heparin used due to recent surgery.  The patient was started on D2 diet, thin liquids due to issues with pocketing.  He has had problems with distractibility, confusion, as well as expressive deficits.  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  Therapies were initiated and were working on pre-gait activity.  Therapy Team recommended CIR for progression.  After surgical clearance, the patient was transferred to Inpatient Rehab Unit.  PAST MEDICAL HISTORY:  The patient was independent and working part-time prior to admission.  He works as a Acupuncturist.  FUNCTIONAL STATUS:  The patient was mod-assist 20-40% for bed mobility, +2 total-assist 60%  for sit-to-stand transfers.  Gait not tested.  He required +2 total-assist for lower body dressing and +2 total-assist for toileting transfers.  RECENT LABS:  Lytes from May 23, 2012, revealed sodium 142, potassium 4.5, chloride 105, CO2 29, BUN 12, creatinine 0.69, glucose 93.  Most recent CBC revealed hemoglobin 11.6, hematocrit 34.6, white count 6.4, platelets 431.  HOSPITAL COURSE:  [name] was admitted to Rehab on May 07, 2012, for inpatient therapies to consist of PT, OT, and speech therapy at least 3 hours 5 days a week.  Past-admission, physiatrist, rehab RN, and therapy team have worked together to provide customized collaborative interdisciplinary care.  Rehab RN has worked with the patient on bowel and bladder program as well as safety plan.  Blood pressures have been checked on b.i.d. basis and are ranging from 106-140 systolic and 70s-80s diastolic. therefore BP meds were held.  The patient was initially noted to have problems with urinary retention.  He was started on bowel and bladder program as well as scheduled toileting.  By time of discharge, he was voiding without difficulty and was continent. Neuropsych evaluation was done on May 15, 2012, revealing significant impairment in auditory memory, complex attention and semantic fluency.  He did not benefit from reported exposure to information and his immediate recollection of information often  did not make contextual sense.  From emotional standpoint, the patient was not exhibiting mood symptoms suggestive of significant depression.  Results and recommendations were discussed with the patient and wife.  Recommendations are for followup evaluation in 8-12 months.  During the patient's stay in Rehab, team conferences were held to monitor the patient's progress, set goals, as well as discuss barriers to discharge.  Physical Therapy has worked with the patient on balance and mobility.  The patient was  ambulating at supervision level.  OT has worked with the patient on self-care tasks with focus on reasoning, sequencing, and alternating attention.  The patient was showing improvement in functional use of right upper and right lower extremity. He was at supervision level for all basic ADLs.  Speech Therapy has worked with the patient on cognition, problem solving, as well as utilization of strategies.  The patient was demonstrating behaviors consistent with Rancho level 7.  He was overall supervision to min- assist for complex problem solving, anticipatory awareness, working memory with utilization of strategies and alternating attention.  He was showing increased expression of wants and needs, but continued to demonstrate mild word-finding difficulties with complex and abstract thoughts.  He was consuming D3 textures of thin liquids with intermittent supervision to utilize compensatory strategies.  Family education was done with wife regarding supervision due to cognitive deficits as well issues related deficits with mobility and ADLs.  Further followup outpatient therapies to continue past discharge.  On May 28, 2012, the patient is discharged to home in improved condition.  DISCHARGE MEDICATIONS: 1. Tylenol 325-650 mg p.o. q.4 hours p.r.n. pain. 2. Protonix 40 mg p.o. per day. 3. Nutritional supplements b.i.d. 4. Folic acid 1 mg p.o. per day. 5. Liver tablets 1 p.o. per day. 6. Multivitamin 1 p.o. per day. 7. Thiamine 250 mg p.o. per day. 8. Vitamin C 1000 units p.o. b.i.d.  DIET:  Regular.  ACTIVITY:  As tolerated with 24-hour supervision.  No strenuous activity.  No driving.  WOUND CARE:  Keep the area clean and dry.  SPECIAL INSTRUCTIONS:  Neuro outpatient rehab to begin on May 30, 2012, at 10:30.  Do not use Accuretic or Flomax.  FOLLOWUP:  The patient to follow up with Dr. Vianne Buckley for posthospital checkup on May 31, 2012, at 3 p.m.; follow up with  Dr. Aliene Buckley for postop check; follow up with Dr. Faith Buckley on June 25, 2011, at 9:50.     Delle Reining, P.A.   ______________________________ Wayne Buckley, M.D.    PL/MEDQ  D:  06/04/2012  T:  06/05/2012  Job:  [id number]  cc:   Reinaldo Meeker, M.D. Magnus Sinning) Tenny Craw, M.D.

## 2012-06-06 ENCOUNTER — Ambulatory Visit: Payer: 59 | Admitting: Occupational Therapy

## 2012-06-06 ENCOUNTER — Ambulatory Visit: Payer: 59 | Admitting: Physical Therapy

## 2012-06-20 ENCOUNTER — Ambulatory Visit: Payer: 59 | Attending: Family Medicine | Admitting: Physical Therapy

## 2012-06-20 ENCOUNTER — Ambulatory Visit: Payer: 59 | Admitting: Occupational Therapy

## 2012-06-20 DIAGNOSIS — R41840 Attention and concentration deficit: Secondary | ICD-10-CM | POA: Insufficient documentation

## 2012-06-20 DIAGNOSIS — R279 Unspecified lack of coordination: Secondary | ICD-10-CM | POA: Insufficient documentation

## 2012-06-20 DIAGNOSIS — R4189 Other symptoms and signs involving cognitive functions and awareness: Secondary | ICD-10-CM | POA: Insufficient documentation

## 2012-06-20 DIAGNOSIS — R269 Unspecified abnormalities of gait and mobility: Secondary | ICD-10-CM | POA: Insufficient documentation

## 2012-06-20 DIAGNOSIS — R41841 Cognitive communication deficit: Secondary | ICD-10-CM | POA: Insufficient documentation

## 2012-06-20 DIAGNOSIS — Z5189 Encounter for other specified aftercare: Secondary | ICD-10-CM | POA: Insufficient documentation

## 2012-06-20 DIAGNOSIS — M6281 Muscle weakness (generalized): Secondary | ICD-10-CM | POA: Insufficient documentation

## 2012-06-24 ENCOUNTER — Encounter: Payer: 59 | Attending: Physical Medicine & Rehabilitation | Admitting: Physical Medicine & Rehabilitation

## 2012-06-24 ENCOUNTER — Encounter: Payer: Self-pay | Admitting: Physical Medicine & Rehabilitation

## 2012-06-24 VITALS — BP 145/90 | HR 110 | Ht 71.0 in | Wt 186.4 lb

## 2012-06-24 DIAGNOSIS — G9340 Encephalopathy, unspecified: Secondary | ICD-10-CM

## 2012-06-24 DIAGNOSIS — S069X9S Unspecified intracranial injury with loss of consciousness of unspecified duration, sequela: Secondary | ICD-10-CM | POA: Insufficient documentation

## 2012-06-24 DIAGNOSIS — S069XAS Unspecified intracranial injury with loss of consciousness status unknown, sequela: Secondary | ICD-10-CM | POA: Insufficient documentation

## 2012-06-24 DIAGNOSIS — S065X9A Traumatic subdural hemorrhage with loss of consciousness of unspecified duration, initial encounter: Secondary | ICD-10-CM

## 2012-06-24 DIAGNOSIS — R259 Unspecified abnormal involuntary movements: Secondary | ICD-10-CM | POA: Insufficient documentation

## 2012-06-24 DIAGNOSIS — R269 Unspecified abnormalities of gait and mobility: Secondary | ICD-10-CM | POA: Insufficient documentation

## 2012-06-24 DIAGNOSIS — X58XXXS Exposure to other specified factors, sequela: Secondary | ICD-10-CM | POA: Insufficient documentation

## 2012-06-24 DIAGNOSIS — F1910 Other psychoactive substance abuse, uncomplicated: Secondary | ICD-10-CM | POA: Insufficient documentation

## 2012-06-24 DIAGNOSIS — I10 Essential (primary) hypertension: Secondary | ICD-10-CM | POA: Insufficient documentation

## 2012-06-24 DIAGNOSIS — I6200 Nontraumatic subdural hemorrhage, unspecified: Secondary | ICD-10-CM

## 2012-06-24 NOTE — Patient Instructions (Signed)
NO DRIVING

## 2012-06-24 NOTE — Progress Notes (Signed)
Subjective:    Patient ID: Wayne Buckley, male    DOB: 01/06/1951, 62 y.o.   MRN: [id number]  HPI  Mr. Dotzler is back regarding his TBI. He is doing quite well. He denies pain. His sleep has been excellent. He is not using any sleep aids.   From an activity standpoint, he is still at outpatient rehab. He is working with PT, OT, and SLP. He enjoys the therapy. They are working a lot on balance, coordination, memory.  Regarding safety, he moving around the house without assistance. He's had no falls. He hasn't demonstrated any unsafe behaviors. He has even cooked a little bit.   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   Pain Inventory Average Pain 0 Pain Right Now 0 My pain is no pain  In the last 24 hours, has pain interfered with the following? General activity 0 Relation with others 0 Enjoyment of life 0 What TIME of day is your pain at its worst? no pain Sleep (in general) Good  Pain is worse with: no pain Pain improves with: no pain Relief from Meds: no pain  Mobility walk without assistance ability to climb steps?  yes do you drive?  no  Function retired  Neuro/Psych No problems in this area  Prior Studies Any changes since last visit?  no  Physicians involved in your care Any changes since last visit?  no   Family History  Problem Relation Age of Onset  . Heart disease Father    History   Social History  . Marital Status: Married    Spouse Name: N/A    Number of Children: N/A  . Years of Education: N/A   Occupational History  . retired Technical sales engineer     Social History Main Topics  . Smoking status: Never Smoker   . Smokeless tobacco: Never Used  . Alcohol Use: Yes     Comment: daily   . Drug Use: None  . Sexually Active: None   Other Topics Concern  . None   Social History Narrative  . None   Past Surgical History  Procedure Date  . Craniotomy 04/28/2012    Procedure: CRANIOTOMY  HEMATOMA EVACUATION SUBDURAL;  Surgeon: Reinaldo Meeker, MD;  Location: [facility] OR;  Service: Neurosurgery;  Laterality: Left;  Left Craniotomy for Subdural Hematoma   Past Medical History  Diagnosis Date  . Hypertension    BP 145/90  Pulse 110  Ht 5\' 11"  (1.803 m)  Wt 186 lb 6.4 oz (84.55 kg)  BMI 26.00 kg/m2  SpO2 98%    Review of Systems  All other systems reviewed and are negative.       Objective:   Physical Exam  General: Alert and oriented x 3, No apparent distress HEENT: Head is normocephalic, atraumatic, PERRLA, EOMI, sclera anicteric, oral mucosa pink and moist, dentition intact, ext ear canals clear,  Neck: Supple without JVD or lymphadenopathy Heart: Reg rate and rhythm. No murmurs rubs or gallops Chest: CTA bilaterally without wheezes, rales, or rhonchi; no distress Abdomen: Soft, non-tender, non-distended, bowel sounds positive. Extremities: No clubbing, cyanosis, or edema. Pulses are 2+ Skin: Clean and intact without signs of breakdown Neuro: Pt is cognitively appropriate with improved insight, memory, and awareness. Cranial nerves 2-12 are intact. Attention improving, but still distractible. Strength is 5/5. No sensory changes. Balance still inhbited. Uses a steppage gait pattern. Rocks from right to left. Didn't appear at risk for falling. A  lot of restless movements while seated, sometimes writhing movements. Remembered 2/3 objects after 5 minutes. Able to discuss some current events. Did 4/4 serial 7's. Able to sequence simple numbers and letters.   Musculoskeletal: Full ROM, No pain with AROM or PROM in the neck, trunk, or extremities. Posture appropriate Psych: Pt's affect is appropriate. Pt is cooperative. He is very pleasant.         Assessment & Plan:  1. Traumatic brain injury, status post left frontal lobectomy. Making nice progress! 2. Gait disorder due to the above 3. Hx of movement disorder premorbidly. I was unaware of this until the patient's wife  mentioned it today. He did not display these movements in the hospital.    4. Polysubstance abuse.    Plan: 1. Continue with outpt therapies. He needs to work on impulsivity, balance, memory, coordination. 2. Discussed with wife regarding liberalizing activity at home. He can be more independnet the more he merits it 3. No driving 4. Probably would be worthwhile to have neuro see him. However, i would wait until the "dust settles" in respect to his TBI. A lot of his movement problems ARE related to this injury 4. Follow up with me in 6 weeks

## 2012-06-25 ENCOUNTER — Ambulatory Visit: Payer: 59 | Admitting: Speech Pathology

## 2012-06-25 ENCOUNTER — Ambulatory Visit: Payer: 59 | Admitting: Occupational Therapy

## 2012-06-25 ENCOUNTER — Ambulatory Visit: Payer: 59 | Admitting: Physical Therapy

## 2012-06-27 ENCOUNTER — Ambulatory Visit: Payer: 59 | Admitting: Occupational Therapy

## 2012-06-27 ENCOUNTER — Ambulatory Visit: Payer: 59 | Admitting: Physical Therapy

## 2012-06-27 ENCOUNTER — Ambulatory Visit: Payer: 59

## 2012-07-02 ENCOUNTER — Ambulatory Visit: Payer: 59

## 2012-07-02 ENCOUNTER — Ambulatory Visit: Payer: 59 | Admitting: Occupational Therapy

## 2012-07-02 ENCOUNTER — Ambulatory Visit: Payer: 59 | Admitting: Physical Therapy

## 2012-07-02 NOTE — Op Note (Signed)
Preop diagnosis: Left subdural hematoma and left frontal intracerebral hemorrhage Postop diagnosis: Same Procedure: Left frontotemporoparietal craniotomy for evacuation of subdural and left frontal intracerebral hemorrhage with left frontal lobectomy Surgeon: Yunior Jain  After and placed in the supine position with the head turned towards the right the patient's scalp was shaved prepped and draped in usual sterile fashion. Question Loraine Leriche shaped incision was made in the left frontotemporoparietal region and the flap was reflected anteriorly without difficulty. 4-hole large craniotomy was fashioned in standard fashion the bone flap removed without difficulty. The dura was then opened in a cruciate fashion and subdural blood was immediately encountered. This was evacuated without difficulty. The brain was still tense and we move forward where the subdural had developed by rupturing L. to the intracerebral hemorrhage. The area of frontal contusion above the intracerebral hemorrhage was enlarged with a generous corticectomy in the large intracerebral hematoma was encountered and evacuated. There was a large amount of pulp frontal lobe and it was elected to perform a generous frontal lobectomy to make room for brain swelling. This was done with suction and bipolar until the anterior 4-5 cm of the frontal lobe had been removed. Any bleeding of the pia-arachnoid was controlled with bipolar coagulation. At this time inspection was carried out in order to showed evidence of residual hemorrhage or bleeding and none could be identified. Large amounts of irrigation were carried out. The cortical margins were lined with Surgicel stamps. The dura was then closed in a cruciate fashion with interrupted Nurolon stitches. The bone was reattached with 4 Lorenz clamps. A subgaleal drain was left in the subgaleal space and brought out through a separate stab wound incision. The wound was then closed with inverted Vicryl on the temporal  fascia and galea and staples on the skin. A sterile dressing was then applied and the patient was taken to recovery room in stable but critical condition.

## 2012-07-04 ENCOUNTER — Ambulatory Visit: Payer: 59 | Admitting: Speech Pathology

## 2012-07-04 ENCOUNTER — Ambulatory Visit: Payer: 59 | Admitting: *Deleted

## 2012-07-04 ENCOUNTER — Ambulatory Visit: Payer: 59 | Admitting: Physical Therapy

## 2012-07-09 ENCOUNTER — Ambulatory Visit: Payer: 59

## 2012-07-09 ENCOUNTER — Ambulatory Visit: Payer: 59 | Admitting: Occupational Therapy

## 2012-07-09 ENCOUNTER — Ambulatory Visit: Payer: 59 | Admitting: Physical Therapy

## 2012-07-11 ENCOUNTER — Ambulatory Visit: Payer: 59 | Admitting: Occupational Therapy

## 2012-07-11 ENCOUNTER — Ambulatory Visit: Payer: 59 | Admitting: Physical Therapy

## 2012-07-11 ENCOUNTER — Ambulatory Visit: Payer: 59

## 2012-07-16 ENCOUNTER — Ambulatory Visit: Payer: 59

## 2012-07-16 ENCOUNTER — Ambulatory Visit: Payer: 59 | Admitting: Physical Therapy

## 2012-07-16 ENCOUNTER — Ambulatory Visit: Payer: 59 | Admitting: Occupational Therapy

## 2012-07-18 ENCOUNTER — Ambulatory Visit: Payer: 59

## 2012-07-18 ENCOUNTER — Ambulatory Visit: Payer: 59 | Admitting: Occupational Therapy

## 2012-07-23 ENCOUNTER — Ambulatory Visit: Payer: 59 | Admitting: Occupational Therapy

## 2012-07-23 ENCOUNTER — Ambulatory Visit: Payer: 59

## 2012-07-23 ENCOUNTER — Ambulatory Visit: Payer: 59 | Admitting: Physical Therapy

## 2012-07-25 ENCOUNTER — Encounter: Payer: 59 | Admitting: Occupational Therapy

## 2012-07-25 ENCOUNTER — Ambulatory Visit: Payer: 59 | Admitting: Occupational Therapy

## 2012-07-25 ENCOUNTER — Ambulatory Visit: Payer: 59 | Attending: Physical Medicine & Rehabilitation | Admitting: Physical Therapy

## 2012-07-25 ENCOUNTER — Ambulatory Visit: Payer: 59

## 2012-07-25 DIAGNOSIS — Z5189 Encounter for other specified aftercare: Secondary | ICD-10-CM | POA: Insufficient documentation

## 2012-07-25 DIAGNOSIS — R41840 Attention and concentration deficit: Secondary | ICD-10-CM | POA: Insufficient documentation

## 2012-07-25 DIAGNOSIS — R41841 Cognitive communication deficit: Secondary | ICD-10-CM | POA: Insufficient documentation

## 2012-07-30 ENCOUNTER — Encounter: Payer: 59 | Admitting: Occupational Therapy

## 2012-07-30 ENCOUNTER — Ambulatory Visit: Payer: 59 | Admitting: Physical Therapy

## 2012-08-01 ENCOUNTER — Ambulatory Visit: Payer: 59 | Admitting: Physical Therapy

## 2012-08-01 ENCOUNTER — Ambulatory Visit: Payer: 59 | Admitting: Occupational Therapy

## 2012-08-06 ENCOUNTER — Encounter: Payer: 59 | Admitting: Occupational Therapy

## 2012-08-06 ENCOUNTER — Ambulatory Visit: Payer: 59 | Admitting: Physical Therapy

## 2012-08-06 ENCOUNTER — Encounter: Payer: 59 | Admitting: Speech Pathology

## 2012-08-07 ENCOUNTER — Encounter: Payer: 59 | Attending: Physical Medicine & Rehabilitation | Admitting: Physical Medicine & Rehabilitation

## 2012-08-07 ENCOUNTER — Ambulatory Visit: Payer: 59 | Admitting: Physical Therapy

## 2012-08-07 ENCOUNTER — Encounter: Payer: 59 | Admitting: Occupational Therapy

## 2012-08-07 ENCOUNTER — Encounter: Payer: Self-pay | Admitting: Physical Medicine & Rehabilitation

## 2012-08-07 VITALS — BP 129/83 | HR 96 | Resp 14 | Ht 71.0 in | Wt 194.4 lb

## 2012-08-07 DIAGNOSIS — S069XAS Unspecified intracranial injury with loss of consciousness status unknown, sequela: Secondary | ICD-10-CM | POA: Insufficient documentation

## 2012-08-07 DIAGNOSIS — R269 Unspecified abnormalities of gait and mobility: Secondary | ICD-10-CM | POA: Insufficient documentation

## 2012-08-07 DIAGNOSIS — I10 Essential (primary) hypertension: Secondary | ICD-10-CM | POA: Insufficient documentation

## 2012-08-07 DIAGNOSIS — X58XXXS Exposure to other specified factors, sequela: Secondary | ICD-10-CM | POA: Insufficient documentation

## 2012-08-07 DIAGNOSIS — F1910 Other psychoactive substance abuse, uncomplicated: Secondary | ICD-10-CM | POA: Insufficient documentation

## 2012-08-07 DIAGNOSIS — R259 Unspecified abnormal involuntary movements: Secondary | ICD-10-CM | POA: Insufficient documentation

## 2012-08-07 NOTE — Patient Instructions (Signed)
CALL ME WITH QUESTIONS 

## 2012-08-07 NOTE — Progress Notes (Signed)
Subjective:    Patient ID: Wayne Buckley, male    DOB: [birth year], 62 y.o.   MRN: [id number]  HPI  Mr. Bosher is back regarding his TBI. He feels that he has improved quite a bit since we last met. His balance and behavior has improved. He's walking without the walker currently. Relations with friends and family have been good. He has been getting out in the community, specifically watching his daughter's 8th grade basketball. His wife feels that he is nearly back to normal quite honestly. He has been alone at home and no real limitations are noted currently.   Over the last couple years he has had increased twitching in his hand and legs as well as frequent, alternating facial movements. They had ceased to a large extent with his TBI, but now have begun to resurface. Laquon says they [place]' have an effect on him from a practical standpoint. They seem to bother other people more than him. He feels that once he becomes more socially and vocationally engaged that he will have "less nervous energy" and that they will improve.  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    Pain Inventory Average Pain 0 Pain Right Now 0 My pain is no pain  In the last 24 hours, has pain interfered with the following? General activity 0 Relation with others 0 Enjoyment of life 0 What TIME of day is your pain at its worst? no pain Sleep (in general) Good  Pain is worse with: no pain Pain improves with: no pain Relief from Meds: no pain med  Mobility how many minutes can you walk? unlimited ability to climb steps?  yes do you drive?  yes  Function retired  Neuro/Psych No problems in this area  Prior Studies Any changes since last visit?  no  Physicians involved in your care Any changes since last visit?  no   Family History   Problem Relation Age of Onset  . Heart disease Father    History   Social History  . Marital Status: Married    Spouse Name: N/A    Number of Children: N/A  . Years of Education: N/A   Occupational History  . retired Technical sales engineer     Social History Main Topics  . Smoking status: Never Smoker   . Smokeless tobacco: Never Used  . Alcohol Use: Yes     Comment: daily   . Drug Use: None  . Sexually Active: None   Other Topics Concern  . None   Social History Narrative  . None   Past Surgical History  Procedure Laterality Date  . Craniotomy  04/28/2012    Procedure: CRANIOTOMY HEMATOMA EVACUATION SUBDURAL;  Surgeon: Reinaldo Meeker, MD;  Location: [facility] OR;  Service: Neurosurgery;  Laterality: Left;  Left Craniotomy for Subdural Hematoma   Past Medical History  Diagnosis Date  . Hypertension    BP 129/83  Pulse 96  Resp 14  Ht 5\' 11"  (1.803 m)  Wt 194 lb 6.4 oz (88.179 kg)  BMI 27.13 kg/m2  SpO2 97%    Review of Systems  All other systems reviewed and are negative.       Objective:   Physical Exam  General: Alert and oriented x 3, No apparent distress  HEENT: Head is normocephalic,  atraumatic, PERRLA, EOMI, sclera anicteric, oral mucosa pink and moist, dentition intact, ext ear canals clear,  Neck: Supple without JVD or lymphadenopathy  Heart: Reg rate and rhythm. No murmurs rubs or gallops  Chest: CTA bilaterally without wheezes, rales, or rhonchi; no distress  Abdomen: Soft, non-tender, non-distended, bowel sounds positive.  Extremities: No clubbing, cyanosis, or edema. Pulses are 2+  Skin: Clean and intact without signs of breakdown  Neuro: Pt is cognitively appropriate with improved insight, memory, and awareness. Cranial nerves 2-12 are intact. Attention is much improved. Strength is 5/5. No sensory changes. Balance is much improved. He walks with excessive ER of each leg which was premorbid. He still favors the right leg and occasionally uses a steppage  pattern.  A lot of restless movements while seated, sometimes writhing movements. His head is frequently involved. Has good insight and awareness.. Able to discuss some current events.  .  Musculoskeletal: Full ROM, No pain with AROM or PROM in the neck, trunk, or extremities. Posture appropriate  Psych: Pt's affect is appropriate. Pt is cooperative. He is very pleasant.   Assessment & Plan:   1. Traumatic brain injury, status post left frontal lobectomy. Making nice progress!  2. Gait disorder due to the above  3. Hx of movement disorder premorbidly. I was unaware of this until the patient's wife mentioned it today. He did not display these movements in the hospital.  4. Polysubstance abuse. (ETOH)   Plan:  1. Continue with HEP. Would focus on treadmill and stationary bike.  2. He may begin driving locally up to 45 minutes in non-peak times 3. Recommend follow up with neurology over the next several months regarding his movement disorder. His choreoathetotic movements could certainly be associated with his hx of alcohol use as well.  4. Follow up with me as needed in the future.

## 2012-08-12 ENCOUNTER — Ambulatory Visit: Payer: 59 | Admitting: Physical Therapy

## 2012-08-12 ENCOUNTER — Ambulatory Visit: Payer: 59 | Admitting: Speech Pathology

## 2012-08-15 ENCOUNTER — Ambulatory Visit: Payer: 59 | Admitting: Physical Therapy

## 2012-08-19 ENCOUNTER — Ambulatory Visit: Payer: 59 | Admitting: Physical Therapy

## 2012-08-19 ENCOUNTER — Encounter: Payer: 59 | Admitting: Occupational Therapy

## 2012-08-19 ENCOUNTER — Encounter: Payer: 59 | Admitting: Speech Pathology

## 2012-08-21 ENCOUNTER — Ambulatory Visit: Payer: 59 | Admitting: Physical Therapy

## 2012-08-21 ENCOUNTER — Encounter: Payer: 59 | Admitting: Occupational Therapy

## 2012-08-26 ENCOUNTER — Encounter: Payer: 59 | Admitting: Occupational Therapy

## 2012-08-27 ENCOUNTER — Ambulatory Visit: Payer: 59 | Admitting: Physical Therapy

## 2012-08-27 ENCOUNTER — Encounter: Payer: 59 | Admitting: Occupational Therapy

## 2012-08-28 ENCOUNTER — Ambulatory Visit: Payer: 59 | Admitting: Physical Therapy

## 2012-08-28 ENCOUNTER — Encounter: Payer: 59 | Admitting: Occupational Therapy

## 2012-08-30 ENCOUNTER — Encounter: Payer: 59 | Admitting: Occupational Therapy

## 2013-04-24 ENCOUNTER — Other Ambulatory Visit: Payer: Self-pay

## 2018-02-17 DIAGNOSIS — S14109A Unspecified injury at unspecified level of cervical spinal cord, initial encounter: Secondary | ICD-10-CM | POA: Diagnosis present

## 2018-02-17 DIAGNOSIS — I82409 Acute embolism and thrombosis of unspecified deep veins of unspecified lower extremity: Secondary | ICD-10-CM

## 2018-02-17 HISTORY — DX: Acute embolism and thrombosis of unspecified deep veins of unspecified lower extremity: I82.409

## 2018-02-17 HISTORY — DX: Unspecified injury at unspecified level of cervical spinal cord, initial encounter: S14.109A

## 2018-02-25 ENCOUNTER — Inpatient Hospital Stay (HOSPITAL_COMMUNITY)
Admission: EM | Admit: 2018-02-25 | Discharge: 2018-03-05 | DRG: 472 | Payer: Medicare Other | Attending: Internal Medicine | Admitting: Internal Medicine

## 2018-02-25 ENCOUNTER — Emergency Department (HOSPITAL_COMMUNITY): Payer: Medicare Other

## 2018-02-25 ENCOUNTER — Observation Stay (HOSPITAL_COMMUNITY): Payer: Medicare Other

## 2018-02-25 ENCOUNTER — Encounter (HOSPITAL_COMMUNITY): Payer: Self-pay | Admitting: *Deleted

## 2018-02-25 ENCOUNTER — Other Ambulatory Visit: Payer: Self-pay

## 2018-02-25 DIAGNOSIS — Z419 Encounter for procedure for purposes other than remedying health state, unspecified: Secondary | ICD-10-CM

## 2018-02-25 DIAGNOSIS — M50023 Cervical disc disorder at C6-C7 level with myelopathy: Principal | ICD-10-CM | POA: Diagnosis present

## 2018-02-25 DIAGNOSIS — S39012A Strain of muscle, fascia and tendon of lower back, initial encounter: Secondary | ICD-10-CM

## 2018-02-25 DIAGNOSIS — R29898 Other symptoms and signs involving the musculoskeletal system: Secondary | ICD-10-CM

## 2018-02-25 DIAGNOSIS — G8220 Paraplegia, unspecified: Secondary | ICD-10-CM | POA: Diagnosis present

## 2018-02-25 DIAGNOSIS — Z7401 Bed confinement status: Secondary | ICD-10-CM

## 2018-02-25 DIAGNOSIS — W19XXXA Unspecified fall, initial encounter: Secondary | ICD-10-CM | POA: Diagnosis not present

## 2018-02-25 DIAGNOSIS — F1010 Alcohol abuse, uncomplicated: Secondary | ICD-10-CM | POA: Diagnosis present

## 2018-02-25 DIAGNOSIS — M546 Pain in thoracic spine: Secondary | ICD-10-CM

## 2018-02-25 DIAGNOSIS — I82412 Acute embolism and thrombosis of left femoral vein: Secondary | ICD-10-CM | POA: Diagnosis present

## 2018-02-25 DIAGNOSIS — I82402 Acute embolism and thrombosis of unspecified deep veins of left lower extremity: Secondary | ICD-10-CM

## 2018-02-25 DIAGNOSIS — G312 Degeneration of nervous system due to alcohol: Secondary | ICD-10-CM | POA: Diagnosis present

## 2018-02-25 DIAGNOSIS — M48061 Spinal stenosis, lumbar region without neurogenic claudication: Secondary | ICD-10-CM | POA: Diagnosis present

## 2018-02-25 DIAGNOSIS — R296 Repeated falls: Secondary | ICD-10-CM | POA: Diagnosis present

## 2018-02-25 DIAGNOSIS — I2510 Atherosclerotic heart disease of native coronary artery without angina pectoris: Secondary | ICD-10-CM | POA: Diagnosis present

## 2018-02-25 DIAGNOSIS — E869 Volume depletion, unspecified: Secondary | ICD-10-CM | POA: Diagnosis present

## 2018-02-25 DIAGNOSIS — M7989 Other specified soft tissue disorders: Secondary | ICD-10-CM

## 2018-02-25 DIAGNOSIS — G9389 Other specified disorders of brain: Secondary | ICD-10-CM | POA: Diagnosis present

## 2018-02-25 DIAGNOSIS — R531 Weakness: Secondary | ICD-10-CM

## 2018-02-25 DIAGNOSIS — G255 Other chorea: Secondary | ICD-10-CM | POA: Diagnosis present

## 2018-02-25 DIAGNOSIS — M79609 Pain in unspecified limb: Secondary | ICD-10-CM

## 2018-02-25 DIAGNOSIS — R262 Difficulty in walking, not elsewhere classified: Secondary | ICD-10-CM | POA: Diagnosis present

## 2018-02-25 DIAGNOSIS — S065X9A Traumatic subdural hemorrhage with loss of consciousness of unspecified duration, initial encounter: Secondary | ICD-10-CM | POA: Diagnosis present

## 2018-02-25 DIAGNOSIS — R0602 Shortness of breath: Secondary | ICD-10-CM

## 2018-02-25 DIAGNOSIS — S065XAA Traumatic subdural hemorrhage with loss of consciousness status unknown, initial encounter: Secondary | ICD-10-CM | POA: Diagnosis present

## 2018-02-25 DIAGNOSIS — N319 Neuromuscular dysfunction of bladder, unspecified: Secondary | ICD-10-CM | POA: Diagnosis present

## 2018-02-25 DIAGNOSIS — Z79899 Other long term (current) drug therapy: Secondary | ICD-10-CM

## 2018-02-25 DIAGNOSIS — N1330 Unspecified hydronephrosis: Secondary | ICD-10-CM | POA: Diagnosis present

## 2018-02-25 DIAGNOSIS — N179 Acute kidney failure, unspecified: Secondary | ICD-10-CM | POA: Diagnosis present

## 2018-02-25 DIAGNOSIS — Z87820 Personal history of traumatic brain injury: Secondary | ICD-10-CM

## 2018-02-25 DIAGNOSIS — R05 Cough: Secondary | ICD-10-CM | POA: Diagnosis not present

## 2018-02-25 DIAGNOSIS — R339 Retention of urine, unspecified: Secondary | ICD-10-CM | POA: Diagnosis present

## 2018-02-25 DIAGNOSIS — D72829 Elevated white blood cell count, unspecified: Secondary | ICD-10-CM | POA: Diagnosis not present

## 2018-02-25 DIAGNOSIS — Z7982 Long term (current) use of aspirin: Secondary | ICD-10-CM

## 2018-02-25 DIAGNOSIS — I129 Hypertensive chronic kidney disease with stage 1 through stage 4 chronic kidney disease, or unspecified chronic kidney disease: Secondary | ICD-10-CM | POA: Diagnosis present

## 2018-02-25 DIAGNOSIS — N189 Chronic kidney disease, unspecified: Secondary | ICD-10-CM | POA: Diagnosis present

## 2018-02-25 DIAGNOSIS — M4712 Other spondylosis with myelopathy, cervical region: Secondary | ICD-10-CM | POA: Diagnosis present

## 2018-02-25 DIAGNOSIS — G9340 Encephalopathy, unspecified: Secondary | ICD-10-CM | POA: Diagnosis present

## 2018-02-25 HISTORY — DX: Unspecified intracranial injury with loss of consciousness of unspecified duration, initial encounter: S06.9X9A

## 2018-02-25 LAB — COMPREHENSIVE METABOLIC PANEL
ALK PHOS: 96 U/L (ref 38–126)
ALT: 33 U/L (ref 0–44)
AST: 25 U/L (ref 15–41)
Albumin: 3.4 g/dL — ABNORMAL LOW (ref 3.5–5.0)
Anion gap: 15 (ref 5–15)
BILIRUBIN TOTAL: 1.4 mg/dL — AB (ref 0.3–1.2)
BUN: 73 mg/dL — ABNORMAL HIGH (ref 8–23)
CALCIUM: 9 mg/dL (ref 8.9–10.3)
CO2: 22 mmol/L (ref 22–32)
CREATININE: 2.79 mg/dL — AB (ref 0.61–1.24)
Chloride: 99 mmol/L (ref 98–111)
GFR calc Af Amer: 25 mL/min — ABNORMAL LOW (ref >60)
GFR, EST NON AFRICAN AMERICAN: 22 mL/min — AB (ref >60)
Glucose, Bld: 111 mg/dL — ABNORMAL HIGH (ref 70–99)
POTASSIUM: 4.2 mmol/L (ref 3.5–5.1)
Sodium: 136 mmol/L (ref 135–145)
TOTAL PROTEIN: 7 g/dL (ref 6.5–8.1)

## 2018-02-25 LAB — CBC WITH DIFFERENTIAL/PLATELET
Abs Immature Granulocytes: 0.1 10*3/uL (ref 0.0–0.1)
BASOS ABS: 0.1 10*3/uL (ref 0.0–0.1)
BASOS PCT: 0 %
EOS ABS: 0.4 10*3/uL (ref 0.0–0.7)
Eosinophils Relative: 3 %
HCT: 44.1 % (ref 39.0–52.0)
HEMOGLOBIN: 14.7 g/dL (ref 13.0–17.0)
Immature Granulocytes: 1 %
Lymphocytes Relative: 8 %
Lymphs Abs: 1.1 10*3/uL (ref 0.7–4.0)
MCH: 29.8 pg (ref 26.0–34.0)
MCHC: 33.3 g/dL (ref 30.0–36.0)
MCV: 89.3 fL (ref 78.0–100.0)
Monocytes Absolute: 1.2 10*3/uL — ABNORMAL HIGH (ref 0.1–1.0)
Monocytes Relative: 9 %
Neutro Abs: 11.5 10*3/uL — ABNORMAL HIGH (ref 1.7–7.7)
Neutrophils Relative %: 79 %
PLATELETS: 291 10*3/uL (ref 150–400)
RBC: 4.94 MIL/uL (ref 4.22–5.81)
RDW: 12.9 % (ref 11.5–15.5)
WBC: 14.4 10*3/uL — AB (ref 4.0–10.5)

## 2018-02-25 LAB — PROTIME-INR
INR: 1.16
Prothrombin Time: 14.7 seconds (ref 11.4–15.2)

## 2018-02-25 LAB — MAGNESIUM: Magnesium: 3.1 mg/dL — ABNORMAL HIGH (ref 1.7–2.4)

## 2018-02-25 LAB — PHOSPHORUS: Phosphorus: 6.4 mg/dL — ABNORMAL HIGH (ref 2.5–4.6)

## 2018-02-25 LAB — TSH: TSH: 2.208 u[IU]/mL (ref 0.350–4.500)

## 2018-02-25 LAB — I-STAT TROPONIN, ED: Troponin i, poc: 0.03 ng/mL (ref 0.00–0.08)

## 2018-02-25 MED ORDER — ENSURE ENLIVE PO LIQD
237.0000 mL | Freq: Two times a day (BID) | ORAL | Status: DC
  Administered 2018-02-26 – 2018-03-05 (×14): 237 mL via ORAL

## 2018-02-25 MED ORDER — VITAMIN C 500 MG PO TABS
1000.0000 mg | ORAL_TABLET | Freq: Two times a day (BID) | ORAL | Status: DC
  Administered 2018-02-26 – 2018-03-05 (×14): 1000 mg via ORAL
  Filled 2018-02-25 (×18): qty 2

## 2018-02-25 MED ORDER — SODIUM CHLORIDE 0.9 % IV BOLUS
1000.0000 mL | Freq: Once | INTRAVENOUS | Status: AC
  Administered 2018-02-25: 1000 mL via INTRAVENOUS

## 2018-02-25 MED ORDER — LACTATED RINGERS IV SOLN: INTRAVENOUS | Status: DC

## 2018-02-25 MED ORDER — PANTOPRAZOLE SODIUM 40 MG PO TBEC
40.0000 mg | DELAYED_RELEASE_TABLET | Freq: Every day | ORAL | Status: DC
  Administered 2018-02-26: 40 mg via ORAL
  Filled 2018-02-25 (×2): qty 1

## 2018-02-25 MED ORDER — VITAMIN B-1 100 MG PO TABS
250.0000 mg | ORAL_TABLET | Freq: Every day | ORAL | Status: DC
  Administered 2018-02-26 – 2018-03-03 (×5): 250 mg via ORAL
  Administered 2018-03-04 – 2018-03-05 (×2): 100 mg via ORAL
  Administered 2018-03-05: 150 mg via ORAL
  Filled 2018-02-25 (×3): qty 3
  Filled 2018-02-25 (×2): qty 1
  Filled 2018-02-25 (×2): qty 3
  Filled 2018-02-25 (×2): qty 1
  Filled 2018-02-25: qty 3

## 2018-02-25 MED ORDER — ADULT MULTIVITAMIN W/MINERALS CH
1.0000 | ORAL_TABLET | Freq: Every day | ORAL | Status: DC
  Administered 2018-02-26 – 2018-03-05 (×7): 1 via ORAL
  Filled 2018-02-25 (×9): qty 1

## 2018-02-25 MED ORDER — SODIUM CHLORIDE 0.9 % IV SOLN
INTRAVENOUS | Status: DC
  Administered 2018-02-25 – 2018-02-27 (×5): via INTRAVENOUS

## 2018-02-25 MED ORDER — SODIUM CHLORIDE 0.9 % IV BOLUS
500.0000 mL | Freq: Once | INTRAVENOUS | Status: AC
  Administered 2018-02-25: 500 mL via INTRAVENOUS

## 2018-02-25 MED ORDER — FOLIC ACID 1 MG PO TABS
1.0000 mg | ORAL_TABLET | Freq: Every day | ORAL | Status: DC
  Administered 2018-02-26 – 2018-03-05 (×8): 1 mg via ORAL
  Filled 2018-02-25 (×10): qty 1

## 2018-02-25 MED ORDER — HEPARIN SODIUM (PORCINE) 5000 UNIT/ML IJ SOLN
5000.0000 [IU] | Freq: Three times a day (TID) | INTRAMUSCULAR | Status: DC
  Administered 2018-02-26 (×2): 5000 [IU] via SUBCUTANEOUS
  Filled 2018-02-25 (×2): qty 1

## 2018-02-25 MED ORDER — MEGESTROL ACETATE 400 MG/10ML PO SUSP: 400.0000 mg | Freq: Two times a day (BID) | ORAL | Status: DC

## 2018-02-25 NOTE — H&P (Signed)
History and Physical  Wayne Buckley:[id number] DOB: [birth year] DOA: 02/25/2018  Referring physician: ER provider PCP: Wayne Floro, MD  Outpatient Specialists:    Patient coming from: Home  Chief Complaint: Recurrent falls  HPI:  Patient is a 67 year old male, with past medical history significant for traumatic subdural hemorrhage status post evacuation, alcohol abuse, urinary retention, aspiration pneumonia and respiratory failure.  Patient is not a particularly good historian.  There is no family member available for collateral information.  Most of the history was gotten from the ER physician.  Apparently, the patient has been falling at home.  Last fall was said to be about 3 to 4 days ago.  Following last fall, the patient has been mainly bedbound, not eating, not wanting to drink, and has been having difficulties moving his lower extremities.  The patient is said not to be his usual self.  Based on above, the patient's family brought him to the hospital for further assessment and management.  On presentation to the hospital, patient was found to have mild leukocytosis of 14,000, BUN of 73 and serum creatinine of 2.79.  Last renal function was documented in 2013.  No fever or chills.  CT scan of the cervical spine, head without contrast, lumbar spine and thoracic spine have not revealed any significant findings.  Urinalysis is still pending.  Chest x-ray is not visualized.  Patient be admitted for further assessment and management.  ED Course: Patient is being volume resuscitated.  Work-up done so far revealed sodium of 136, potassium of 4.2, chloride of 9 9, CO2 22, BUN of 73 with serum creatinine of 2.79 with blood sugar of 111.  First troponin was 0.03.  CBC revealed WBC of 14.4, hemoglobin of 14.7, hematocrit of 44.1, MCV of 89.3 with a platelet count of 291.  CT scan is as documented above.  Hospitalist team has been asked to admit patient for further assessment and  management Pertinent labs:  EKG: Independently reviewed.  Imaging: independently reviewed.   Review of Systems:  Negative for fever, visual changes, sore throat, rash, new muscle aches, chest pain, SOB, dysuria, bleeding, n/v/abdominal pain.  Past Medical History:  Diagnosis Date  . Hypertension   . TBI (traumatic brain injury) (HCC) 2013    Past Surgical History:  Procedure Laterality Date  . CRANIOTOMY  04/28/2012   Procedure: CRANIOTOMY HEMATOMA EVACUATION SUBDURAL;  Surgeon: Wayne Meeker, MD;  Location: [facility] OR;  Service: Neurosurgery;  Laterality: Left;  Left Craniotomy for Subdural Hematoma     reports that he has never smoked. He has never used smokeless tobacco. He reports that he drinks alcohol. His drug history is not on file.  No Known Allergies  Family History  Problem Relation Age of Onset  . Heart disease Father      Prior to Admission medications   Medication Sig Start Date End Date Taking? Authorizing Provider  acetaminophen (TYLENOL) 325 MG tablet Take 1-2 tablets (325-650 mg total) by mouth every 4 (four) hours as needed. 05/28/12   Love, Evlyn Kanner, PA-C  Ascorbic Acid (VITAMIN C) 1000 MG tablet Take 1,000 mg by mouth 2 (two) times daily.    [provider]  feeding supplement (ENSURE COMPLETE) LIQD Take 237 mLs by mouth 2 (two) times daily between meals. 05/07/12   Simonne Martinet, NP  folic acid (FOLVITE) 1 MG tablet Take 1 tablet (1 mg total) by mouth daily. 05/28/12   LoveEvlyn Kanner, PA-C  Liver Extract (LIVER PO)  Take 1 tablet by mouth daily.    [provider]  Multiple Vitamin (MULTIVITAMIN WITH MINERALS) TABS Take 1 tablet by mouth daily.    [provider]  pantoprazole (PROTONIX) 40 MG tablet Take 1 tablet (40 mg total) by mouth daily at 12 noon. 05/28/12   Love, Evlyn Kanner, PA-C  thiamine 250 MG tablet Take 1 tablet (250 mg total) by mouth daily. 05/28/12   Jacquelynn Cree, PA-C    Physical Exam: Vitals:   02/25/18 1259  02/25/18 1300 02/25/18 1320  BP: (!) 130/92    Pulse: 97    Resp: 14    Temp: 98.7 F (37.1 C)    TempSrc: Oral    SpO2: 98%    Weight:  90.7 kg 91 kg  Height:  5\' 11"  (1.803 m) 5\' 11"  (1.803 m)    Constitutional:  . Patient is not in any obvious distress.    Eyes:  Marland Kitchen Mild pallor. No jaundice.  ENMT:  . external ears, nose appear normal Dry buccal mucosa. Neck:  . Neck is supple. No JVD Respiratory:  . CTA bilaterally, no w/r/r.  . Respiratory effort normal. No retractions or accessory muscle use Cardiovascular:  . S1S2 . Fullness of the left lower extremity.     Abdomen:  . Abdomen is soft and non tender. Organs are difficult to assess. Neurologic:  . Awake and alert. . Moves both upper extremities, though, in a disorganized pattern . Movement of the lower extremities limited, especially, left lower extremity.  Wt Readings from Last 3 Encounters:  02/25/18 91 kg  08/07/12 88.2 kg  06/24/12 84.6 kg    I have personally reviewed following labs and imaging studies  Labs on Admission:  CBC: Recent Labs  Lab 02/25/18 1413  WBC 14.4*  NEUTROABS 11.5*  HGB 14.7  HCT 44.1  MCV 89.3  PLT 291   Basic Metabolic Panel: Recent Labs  Lab 02/25/18 1413  NA 136  K 4.2  CL 99  CO2 22  GLUCOSE 111*  BUN 73*  CREATININE 2.79*  CALCIUM 9.0   Liver Function Tests: Recent Labs  Lab 02/25/18 1413  AST 25  ALT 33  ALKPHOS 96  BILITOT 1.4*  PROT 7.0  ALBUMIN 3.4*   No results for input(s): LIPASE, AMYLASE in the last 168 hours. No results for input(s): AMMONIA in the last 168 hours. Coagulation Profile: Recent Labs  Lab 02/25/18 1413  INR 1.16   Cardiac Enzymes: No results for input(s): CKTOTAL, CKMB, CKMBINDEX, TROPONINI in the last 168 hours. BNP (last 3 results) No results for input(s): PROBNP in the last 8760 hours. HbA1C: No results for input(s): HGBA1C in the last 72 hours. CBG: No results for input(s): GLUCAP in the last 168 hours. Lipid  Profile: No results for input(s): CHOL, HDL, LDLCALC, TRIG, CHOLHDL, LDLDIRECT in the last 72 hours. Thyroid Function Tests: No results for input(s): TSH, T4TOTAL, FREET4, T3FREE, THYROIDAB in the last 72 hours. Anemia Panel: No results for input(s): VITAMINB12, FOLATE, FERRITIN, TIBC, IRON, RETICCTPCT in the last 72 hours. Urine analysis:    Component Value Date/Time   COLORURINE YELLOW 05/08/2012 0314   APPEARANCEUR CLEAR 05/08/2012 0314   LABSPEC 1.019 05/08/2012 0314   PHURINE 5.5 05/08/2012 0314   GLUCOSEU NEGATIVE 05/08/2012 0314   HGBUR NEGATIVE 05/08/2012 0314   BILIRUBINUR NEGATIVE 05/08/2012 0314   KETONESUR NEGATIVE 05/08/2012 0314   PROTEINUR NEGATIVE 05/08/2012 0314   UROBILINOGEN 0.2 05/08/2012 0314   NITRITE NEGATIVE 05/08/2012 0314  LEUKOCYTESUR NEGATIVE 05/08/2012 0314   Sepsis Labs: @LABRCNTIP (procalcitonin:4,lacticidven:4) )No results found for this or any previous visit (from the past 240 hour(s)).    Radiological Exams on Admission: Ct Head Wo Contrast  Result Date: 02/25/2018 CLINICAL DATA:  Head trauma, minor, GCS greater than 13. Altered level of consciousness, unexplained. Unwitnessed fall 4 days ago. EXAM: CT HEAD WITHOUT CONTRAST TECHNIQUE: Contiguous axial images were obtained from the base of the skull through the vertex without intravenous contrast. COMPARISON:  CT head without contrast,/15/13. FINDINGS: Brain: Chronic encephalomalacia involving the anterior left frontal lobe and temporal tip is noted. This corresponds with previous injury. Study is moderately degraded by patient motion. Basal ganglia are otherwise intact. Insular ribbon is normal bilaterally apart from the anterior encephalomalacia on the left. Study is nondiagnostic in the posterior fossa. No acute hemorrhage or mass lesion is present. The ventricles are proportionate to the degree of atrophy with ex vacuo dilation involving the left lateral ventricle. Vascular: Atherosclerotic  calcifications are present within the cavernous internal carotid arteries. There is no hyperdense vessel. Skull: No definite fractures are present. A remote nondisplaced right occipital skull fracture is present. No new fractures are present. Sinuses/Orbits: The visualized paranasal sinuses and mastoid air cells are clear. No definite orbit trauma is present. IMPRESSION: 1. Although the study is severely degraded by patient motion, no acute trauma is evident. 2. Expected chronic encephalomalacia involving the anterior inferior left frontal lobe and left temporal tip. 3. Ex vacuo dilation of the left lateral ventricle. 4. Remote left frontal craniotomy. 5. Remote right occipital skull fracture, stable. Electronically Signed   By: Marin Roberts M.D.   On: 02/25/2018 16:48   Ct Cervical Spine Wo Contrast  Result Date: 02/25/2018 CLINICAL DATA:  Fall 4 days ago. Unable to ambulate since fall. Cervical spine trauma, high clinical risk. EXAM: CT CERVICAL SPINE WITHOUT CONTRAST TECHNIQUE: Multidetector CT imaging of the cervical spine was performed without intravenous contrast. Multiplanar CT image reconstructions were also generated. COMPARISON:  CT of the cervical spine 04/28/2012 FINDINGS: Alignment: Slight anterolisthesis at C2-3 is stable. AP alignment is otherwise anatomic. There straightening of the normal cervical lordosis. Skull base and vertebrae: The study is mildly degraded by patient motion. This obscures detail at the craniocervical junction. There is also some obscuration of detail in the lower cervical spine. No acute fracture present Soft tissues and spinal canal: Soft tissues the neck demonstrate atherosclerotic changes at the carotid bifurcations without significant stenosis. Thyroid is normal. No significant adenopathy is present. No soft tissue trauma is present. Disc levels: Left greater than right foraminal narrowing is again seen at C2-3, C3-4, C4-5, and C5-6 due to asymmetric  uncovertebral and facet disease. Moderate to severe bilateral foraminal stenosis is present at C6-7. There is mild foraminal narrowing bilaterally at C7-T1. There is progressive stenosis since the prior study. Upper chest: The lung apices are clear. IMPRESSION: 1. No acute traumatic injury to the cervical spine. 2. Study is mildly degraded by patient motion. 3. Slight progression of multilevel moderate to severe spondylosis as described. Electronically Signed   By: Marin Roberts M.D.   On: 02/25/2018 16:56   Ct Thoracic Spine Wo Contrast  Result Date: 02/25/2018 CLINICAL DATA:  Fall.  Thoracic spine fracture. EXAM: CT THORACIC SPINE WITHOUT CONTRAST TECHNIQUE: Multidetector CT images of the thoracic were obtained using the standard protocol without intravenous contrast. COMPARISON:  None. FINDINGS: Alignment: Normal alignment Image quality degraded by motion. Vertebrae: Negative for thoracic fracture Paraspinal and other soft tissues:  No paraspinous mass or soft tissue edema. Visualized lungs are clear. Calcified lymph nodes in the mediastinum and left hilum. Coronary artery calcification. Disc levels: Prominent anterior and right lateral spurring from T4 through T12. Mild disc space narrowing in the thoracic spine. Small calcified central disc protrusion at T8-9 with mild facet degeneration. IMPRESSION: Image quality degraded by motion Negative for thoracic fracture Electronically Signed   By: Marlan Palau M.D.   On: 02/25/2018 16:40   Ct Lumbar Spine Wo Contrast  Result Date: 02/25/2018 CLINICAL DATA:  The patient has been unable to ambulate since a fall 4 days ago at home. EXAM: CT LUMBAR SPINE WITHOUT CONTRAST TECHNIQUE: Multidetector CT imaging of the lumbar spine was performed without intravenous contrast administration. Multiplanar CT image reconstructions were also generated. COMPARISON:  None. FINDINGS: Segmentation: 5 lumbar type vertebrae. Alignment: Normal. Vertebrae: No fractures. Diffuse  severe degenerative disc disease in the lumbar spine with relative sparing at L5-S1. Paraspinal and other soft tissues: There is slight dilatation of both ureters with marked distention of the bladder. Slight soft tissue stranding around the left ureter at the L5-S1 level with a small amount of fluid in the left pericolic gutter at that level. This is best seen on images 107-119 of series 5. Aortic atherosclerosis. Disc levels: T11-12: Slight disc space narrowing. Slight left facet arthritis. T12-L1: Disc space narrowing. Small calcified disc protrusion to the left of midline. L1-2: Marked disc space narrowing. Small broad-based disc bulge without neural impingement. L2-3: Marked disc space narrowing. Small broad-based disc bulge with accompanying osteophytes extending into the right neural foramen without focal neural impingement. Slight narrowing of the spinal canal. L3-4: Marked disc space narrowing. Broad-based endplate osteophytes slightly compress the thecal sac. No focal neural impingement. Moderate bilateral facet arthritis. L4-5: Marked disc space narrowing with degenerative changes of the vertebral endplates. Broad-based endplate osteophytes with moderate bilateral foraminal stenosis, left greater than right with moderate bilateral facet arthritis. There is hypertrophy of the ligamentum flavum and facet joints which combine to create moderately severe spinal stenosis best seen on image 91 of series 4. L5-S1: Small broad-based disc bulge without neural impingement. Moderate bilateral facet arthritis, right greater than left. Moderate right foraminal stenosis. IMPRESSION: 1. No acute abnormality of the lumbar spine. 2. Moderately severe spinal stenosis at L4-5. 3. Diffuse degenerative disc and joint disease in the lumbar spine as described. 4. Bilateral distended ureters and bladder. This may be due to bladder outlet obstruction or neurogenic bladder. Electronically Signed   By: Francene Boyers M.D.   On:  02/25/2018 16:43   Dg Hip Unilat W Or Wo Pelvis 2-3 Views Left  Result Date: 02/25/2018 CLINICAL DATA:  Fall, left leg pain. EXAM: DG HIP (WITH OR WITHOUT PELVIS) 2-3V LEFT COMPARISON:  None. FINDINGS: Hernia mesh markers noted along the lower pelvis. Lower lumbar spondylosis and degenerative disc disease. Spurring of both femoral heads. The patient's hips are both internally rotated on the pelvis and frontal projections, which can reduce sensitivity detecting fracture. No bony pelvic fracture is observed. There is spurring of the left femoral head causing a sclerotic edge extending across the femoral neck bilaterally. I do not see discrete cortical discontinuity to suggest a fracture. Vascular calcification noted. IMPRESSION: 1. No fracture identified. Mildly reduced sensitivity due to internal rotation of the hip on the frontal projections. If the patient is not able to bear weight then other complementary imaging (preferably MRI, which has a greater sensitivity than CT) should be considered. 2. Atherosclerosis. 3.  Lower lumbar spondylosis and degenerative disc disease. 4. Degenerative spurring of both femoral heads. Electronically Signed   By: Gaylyn Rong M.D.   On: 02/25/2018 15:44    EKG: Independently reviewed.   Active Problems:   Fall   Assessment/Plan Recurrent falls: Admit patient for further assessment and management. We will hydrate patient. Will work-up patient's leukocytosis Will work-up patient has AKI Monitor renal function and electrolytes Physical therapy consult Occupational Therapy consult Low threshold to proceed with MRI of the brain and neurological consult.  Acute kidney injury: Suspect this is likely pre-renal. We will check urine sodium level. Hydrate patient. We will continue to monitor patient's renal function and electrolytes. We will get a renal ultrasound. Further management will depend on above.  Volume depletion: Continue IV fluids for  now. Monitor pulmonary status, volume status, renal function and electrolytes.  History of subdural hemorrhage status post evacuation: Manage supportively.  Further management will depend on hospital course.  DVT prophylaxis: Subcutaneous heparin Code Status: Full  Family Communication:  Disposition Plan: Will depend on hospital course Consults called: None for now.  Have a low threshold to consult neurology Admission status: Observation  Time spent: 65 minutes  Berton Mount, MD  [facility] Pager #: [phone] 7PM-7AM contact night coverage as above   02/25/2018, 6:32 PM

## 2018-02-25 NOTE — ED Provider Notes (Signed)
Patient's imaging is without acute findings.  He is found to have acute kidney injury today with a creatinine of 2.79 and elevated BUN thought to be prerenal due to not eating or drinking much in the last 4 days.  Patient sensation in bilateral lower extremities is intact but he is unable to pick his legs up off the bed.  He is able to move his legs but does not have the strength to lift them off the bed.  Low suspicion for cord injury at this time.  Patient did show a distended bladder on his CT but has since urinated in his pull-up.  Findings discussed with the patient and his family member.  Will admit for further care.  Also noted left leg is edematous compared to the right and we will do an ultrasound to ensure patient does not have a new DVT.   Gwyneth Sprout, MD 02/25/18 1745

## 2018-02-25 NOTE — ED Triage Notes (Addendum)
Pt from Home via [facility]. [facility] staff reported Pt fell on Thursday at home . Wife found Pt on floor and Fall was unwitnessed . Pt has not been able to ambulate since the fall on Thursday.  Per Wife Pt also fell on 02-19-2018 and was taken to [facility]. Pt was ambulatory after the fall on 02-19-18. At base line Pt uses a walker and/Or cane as needed.  Pt has a Hx of TBI 2013 with brain bleed. At base line tremors are not new.

## 2018-02-25 NOTE — ED Triage Notes (Signed)
Vascular reported they needed to do vacular  [place] . Pt moved to F-3 waiting Vacular tech.

## 2018-02-25 NOTE — Progress Notes (Signed)
Preliminary notes--Left lower extremity venous duplex exam completed. Positive for Acute deep veins thrombosis, involving left Common femoral vein, femoral vein, profunda vein, popliteal vein, peroneal veins, posterior tibial veins, gastrocnemius vein, superficial vein thrombosis includes greater saphenous vein.   Left external iliac vein also thrombosed. IVC not be able to visualized due to bowel gas obscures. Result called RN [phone]). Hongying Tinlee Navarrette (RDMS RVT) 02/25/18 7:20 PM

## 2018-02-25 NOTE — ED Provider Notes (Signed)
Emergency Department Provider Note   I have reviewed the triage vital signs and the nursing notes.   HISTORY  Chief Complaint Fall   HPI Wayne Buckley is a 67 y.o. male with PMH of HTN and TBI s/p traumatic SDH and evacuation presents to the emergency department for evaluation of generalized fatigue, back pain, inability to walk since a series of falls. Patient fell while on vacation 6 days ago.  He presented to [facility] where he was evaluated and discharged after CT head and cervical spine.  Patient's wife states that he was walking after this fall but was more weak. He fell again on the 9/5.  The fall was unwitnessed.  The wife had stepped out to get something to eat and when she came back home he was lying on the living room floor.  Patient states he just "lost my balance" and fell but cannot provide significant detail regarding the fall.  Patient and wife deny any fevers or chills.  No medication changes.  Patient is not anticoagulated.  Wife does report that patient seems to be moving his LLE less than normal. Cannot recall if this is his weak side or not.   Past Medical History:  Diagnosis Date  . Hypertension   . TBI (traumatic brain injury) [facility]) 2013    Patient Active Problem List   Diagnosis Date Noted  . Alcohol abuse 05/04/2012  . Hypokalemia 05/04/2012  . Urinary retention 05/04/2012  . Encephalopathy acute 05/04/2012  . Aspiration pneumonia (HCC) 05/02/2012  . Acute respiratory failure (HCC) 04/28/2012  . Subdural hematoma, post-traumatic (HCC) 04/28/2012    Past Surgical History:  Procedure Laterality Date  . CRANIOTOMY  04/28/2012   Procedure: CRANIOTOMY HEMATOMA EVACUATION SUBDURAL;  Surgeon: Reinaldo Meeker, MD;  Location: [facility] OR;  Service: Neurosurgery;  Laterality: Left;  Left Craniotomy for Subdural Hematoma   Allergies Patient has no known allergies.  Family History  Problem Relation Age of Onset  . Heart disease Father      Social History Social History   Tobacco Use  . Smoking status: Never Smoker  . Smokeless tobacco: Never Used  Substance Use Topics  . Alcohol use: Yes    Comment: daily   . Drug use: Not on file    Review of Systems  Constitutional: No fever/chills. Positive generalized weakness.  Eyes: No visual changes. ENT: No sore throat. Cardiovascular: Denies chest pain. Respiratory: Denies shortness of breath. Gastrointestinal: No abdominal pain.  No nausea, no vomiting.  No diarrhea.  No constipation. Genitourinary: Negative for dysuria. Musculoskeletal: Positive for back pain. Skin: Negative for rash. Neurological: Negative for headaches, focal weakness or numbness.  10-point ROS otherwise negative.  ____________________________________________   PHYSICAL EXAM:  VITAL SIGNS: ED Triage Vitals  Enc Vitals Group     BP 02/25/18 1259 (!) 130/92     Pulse Rate 02/25/18 1259 97     Resp 02/25/18 1259 14     Temp 02/25/18 1259 98.7 F (37.1 C)     Temp Source 02/25/18 1259 Oral     SpO2 02/25/18 1259 98 %     Weight 02/25/18 1300 200 lb (90.7 kg)     Height 02/25/18 1300 5\' 11"  (1.803 m)     Pain Score 02/25/18 1320 0   Constitutional: Alert but slightly confused. Well appearing and in no acute distress. Eyes: Conjunctivae are normal. Head: Atraumatic. Nose: No congestion/rhinnorhea. Mouth/Throat: Mucous membranes are moist.  Oropharynx non-erythematous. Neck: No stridor.  Cardiovascular: Normal rate, regular rhythm. Good peripheral circulation. Grossly normal heart sounds.   Respiratory: Normal respiratory effort.  No retractions. Lungs CTAB. Gastrointestinal: Soft and nontender. No distention.  Musculoskeletal: No lower extremity tenderness. Left leg appears slightly swollen compared to the right. Normal passive  No gross deformities of extremities. No midline lumbar spine tenderness.  Neurologic: Speech spurred (baseline). Patient with baseline writhing movements of  upper and lower extremities. Patient with normal sensation of the LLE and movement with pain stimulus. Otherwise equal strength in the upper extremities. No pronator drift.  Skin:  Skin is warm, dry and intact. No rash noted.  ____________________________________________   LABS (all labs ordered are listed, but only abnormal results are displayed)  Labs Reviewed  COMPREHENSIVE METABOLIC PANEL - Abnormal; Notable for the following components:      Result Value   Glucose, Bld 111 (*)    BUN 73 (*)    Creatinine, Ser 2.79 (*)    Albumin 3.4 (*)    Total Bilirubin 1.4 (*)    GFR calc non Af Amer 22 (*)    GFR calc Af Amer 25 (*)    All other components within normal limits  CBC WITH DIFFERENTIAL/PLATELET - Abnormal; Notable for the following components:   WBC 14.4 (*)    Neutro Abs 11.5 (*)    Monocytes Absolute 1.2 (*)    All other components within normal limits  PROTIME-INR  URINALYSIS, ROUTINE W REFLEX MICROSCOPIC  CK  I-STAT TROPONIN, ED   ____________________________________________  EKG   EKG Interpretation  Date/Time:  Monday February 25 2018 16:06:52 EDT Ventricular Rate:  93 PR Interval:  180 QRS Duration: 102 QT Interval:  344 QTC Calculation: 427 R Axis:   -16 Text Interpretation:  Normal sinus rhythm Possible Left atrial enlargement Septal infarct , age undetermined No significant change since last tracing Confirmed by Gwyneth Sprout ([id number]) on 02/25/2018 4:31:19 PM       ____________________________________________  RADIOLOGY  Ct Head Wo Contrast  Result Date: 02/25/2018 CLINICAL DATA:  Head trauma, minor, GCS greater than 13. Altered level of consciousness, unexplained. Unwitnessed fall 4 days ago. EXAM: CT HEAD WITHOUT CONTRAST TECHNIQUE: Contiguous axial images were obtained from the base of the skull through the vertex without intravenous contrast. COMPARISON:  CT head without contrast,/15/13. FINDINGS: Brain: Chronic encephalomalacia involving the  anterior left frontal lobe and temporal tip is noted. This corresponds with previous injury. Study is moderately degraded by patient motion. Basal ganglia are otherwise intact. Insular ribbon is normal bilaterally apart from the anterior encephalomalacia on the left. Study is nondiagnostic in the posterior fossa. No acute hemorrhage or mass lesion is present. The ventricles are proportionate to the degree of atrophy with ex vacuo dilation involving the left lateral ventricle. Vascular: Atherosclerotic calcifications are present within the cavernous internal carotid arteries. There is no hyperdense vessel. Skull: No definite fractures are present. A remote nondisplaced right occipital skull fracture is present. No new fractures are present. Sinuses/Orbits: The visualized paranasal sinuses and mastoid air cells are clear. No definite orbit trauma is present. IMPRESSION: 1. Although the study is severely degraded by patient motion, no acute trauma is evident. 2. Expected chronic encephalomalacia involving the anterior inferior left frontal lobe and left temporal tip. 3. Ex vacuo dilation of the left lateral ventricle. 4. Remote left frontal craniotomy. 5. Remote right occipital skull fracture, stable. Electronically Signed   By: Marin Roberts M.D.   On: 02/25/2018 16:48   Ct Cervical Spine Wo Contrast  Result Date: 02/25/2018 CLINICAL DATA:  Fall 4 days ago. Unable to ambulate since fall. Cervical spine trauma, high clinical risk. EXAM: CT CERVICAL SPINE WITHOUT CONTRAST TECHNIQUE: Multidetector CT imaging of the cervical spine was performed without intravenous contrast. Multiplanar CT image reconstructions were also generated. COMPARISON:  CT of the cervical spine 04/28/2012 FINDINGS: Alignment: Slight anterolisthesis at C2-3 is stable. AP alignment is otherwise anatomic. There straightening of the normal cervical lordosis. Skull base and vertebrae: The study is mildly degraded by patient motion. This  obscures detail at the craniocervical junction. There is also some obscuration of detail in the lower cervical spine. No acute fracture present Soft tissues and spinal canal: Soft tissues the neck demonstrate atherosclerotic changes at the carotid bifurcations without significant stenosis. Thyroid is normal. No significant adenopathy is present. No soft tissue trauma is present. Disc levels: Left greater than right foraminal narrowing is again seen at C2-3, C3-4, C4-5, and C5-6 due to asymmetric uncovertebral and facet disease. Moderate to severe bilateral foraminal stenosis is present at C6-7. There is mild foraminal narrowing bilaterally at C7-T1. There is progressive stenosis since the prior study. Upper chest: The lung apices are clear. IMPRESSION: 1. No acute traumatic injury to the cervical spine. 2. Study is mildly degraded by patient motion. 3. Slight progression of multilevel moderate to severe spondylosis as described. Electronically Signed   By: Marin Roberts M.D.   On: 02/25/2018 16:56   Ct Thoracic Spine Wo Contrast  Result Date: 02/25/2018 CLINICAL DATA:  Fall.  Thoracic spine fracture. EXAM: CT THORACIC SPINE WITHOUT CONTRAST TECHNIQUE: Multidetector CT images of the thoracic were obtained using the standard protocol without intravenous contrast. COMPARISON:  None. FINDINGS: Alignment: Normal alignment Image quality degraded by motion. Vertebrae: Negative for thoracic fracture Paraspinal and other soft tissues: No paraspinous mass or soft tissue edema. Visualized lungs are clear. Calcified lymph nodes in the mediastinum and left hilum. Coronary artery calcification. Disc levels: Prominent anterior and right lateral spurring from T4 through T12. Mild disc space narrowing in the thoracic spine. Small calcified central disc protrusion at T8-9 with mild facet degeneration. IMPRESSION: Image quality degraded by motion Negative for thoracic fracture Electronically Signed   By: Marlan Palau M.D.    On: 02/25/2018 16:40   Ct Lumbar Spine Wo Contrast  Result Date: 02/25/2018 CLINICAL DATA:  The patient has been unable to ambulate since a fall 4 days ago at home. EXAM: CT LUMBAR SPINE WITHOUT CONTRAST TECHNIQUE: Multidetector CT imaging of the lumbar spine was performed without intravenous contrast administration. Multiplanar CT image reconstructions were also generated. COMPARISON:  None. FINDINGS: Segmentation: 5 lumbar type vertebrae. Alignment: Normal. Vertebrae: No fractures. Diffuse severe degenerative disc disease in the lumbar spine with relative sparing at L5-S1. Paraspinal and other soft tissues: There is slight dilatation of both ureters with marked distention of the bladder. Slight soft tissue stranding around the left ureter at the L5-S1 level with a small amount of fluid in the left pericolic gutter at that level. This is best seen on images 107-119 of series 5. Aortic atherosclerosis. Disc levels: T11-12: Slight disc space narrowing. Slight left facet arthritis. T12-L1: Disc space narrowing. Small calcified disc protrusion to the left of midline. L1-2: Marked disc space narrowing. Small broad-based disc bulge without neural impingement. L2-3: Marked disc space narrowing. Small broad-based disc bulge with accompanying osteophytes extending into the right neural foramen without focal neural impingement. Slight narrowing of the spinal canal. L3-4: Marked disc space narrowing. Broad-based endplate osteophytes slightly compress  the thecal sac. No focal neural impingement. Moderate bilateral facet arthritis. L4-5: Marked disc space narrowing with degenerative changes of the vertebral endplates. Broad-based endplate osteophytes with moderate bilateral foraminal stenosis, left greater than right with moderate bilateral facet arthritis. There is hypertrophy of the ligamentum flavum and facet joints which combine to create moderately severe spinal stenosis best seen on image 91 of series 4. L5-S1: Small  broad-based disc bulge without neural impingement. Moderate bilateral facet arthritis, right greater than left. Moderate right foraminal stenosis. IMPRESSION: 1. No acute abnormality of the lumbar spine. 2. Moderately severe spinal stenosis at L4-5. 3. Diffuse degenerative disc and joint disease in the lumbar spine as described. 4. Bilateral distended ureters and bladder. This may be due to bladder outlet obstruction or neurogenic bladder. Electronically Signed   By: Francene Boyers M.D.   On: 02/25/2018 16:43   Dg Hip Unilat W Or Wo Pelvis 2-3 Views Left  Result Date: 02/25/2018 CLINICAL DATA:  Fall, left leg pain. EXAM: DG HIP (WITH OR WITHOUT PELVIS) 2-3V LEFT COMPARISON:  None. FINDINGS: Hernia mesh markers noted along the lower pelvis. Lower lumbar spondylosis and degenerative disc disease. Spurring of both femoral heads. The patient's hips are both internally rotated on the pelvis and frontal projections, which can reduce sensitivity detecting fracture. No bony pelvic fracture is observed. There is spurring of the left femoral head causing a sclerotic edge extending across the femoral neck bilaterally. I do not see discrete cortical discontinuity to suggest a fracture. Vascular calcification noted. IMPRESSION: 1. No fracture identified. Mildly reduced sensitivity due to internal rotation of the hip on the frontal projections. If the patient is not able to bear weight then other complementary imaging (preferably MRI, which has a greater sensitivity than CT) should be considered. 2. Atherosclerosis. 3. Lower lumbar spondylosis and degenerative disc disease. 4. Degenerative spurring of both femoral heads. Electronically Signed   By: Gaylyn Rong M.D.   On: 02/25/2018 15:44    ____________________________________________   PROCEDURES  Procedure(s) performed:   Procedures  None ____________________________________________   INITIAL IMPRESSION / ASSESSMENT AND PLAN / ED COURSE  Pertinent  labs & imaging results that were available during my care of the patient were reviewed by me and considered in my medical decision making (see chart for details).  Patient presents to the emergency department for evaluation of generalized weakness and inability to walk after a series of falls in the last week.  He sought medical attention after the first fall but not the second.  He is complaining of back pain but has no midline tenderness to palpation.  Wife states that he may be moving his left leg less normal.  He does have some baseline unilateral weakness but the wife cannot recall which side it is typically.  She feels more weak to me on the left especially in the lower extremity.   03:32 PM Patient with elevated creatinine. No prior history of CKD. BUN also elevated. Suspect pre-renal etiology. Patient given 500 ml bolus previously. Plain films and scans are pending. Plan for LR at rate for AKI and follow imaging. Plain film of the hip reviewed. Plan for correction of AKI and see if patient can walk prior to committing to MRI of the hip.   Care transferred to Dr. Anitra Lauth to follow CT results.  ____________________________________________  FINAL CLINICAL IMPRESSION(S) / ED DIAGNOSES  Final diagnoses:  AKI (acute kidney injury) (HCC)  Generalized weakness  Acute midline thoracic back pain  Strain of lumbar region,  initial encounter  Inability to walk     MEDICATIONS GIVEN DURING THIS VISIT:  Medications  sodium chloride 0.9 % bolus 500 mL (has no administration in time range)  lactated ringers infusion (has no administration in time range)     Note:  This document was prepared using Dragon voice recognition software and may include unintentional dictation errors.  Alona Bene, MD Emergency Medicine    Schwanda Zima, Arlyss Repress, MD 02/25/18 [phone]

## 2018-02-25 NOTE — ED Triage Notes (Signed)
[place] tech at bed side in F-3

## 2018-02-26 ENCOUNTER — Encounter (HOSPITAL_COMMUNITY): Payer: Self-pay

## 2018-02-26 ENCOUNTER — Inpatient Hospital Stay (HOSPITAL_COMMUNITY): Payer: Medicare Other

## 2018-02-26 ENCOUNTER — Observation Stay (HOSPITAL_COMMUNITY): Payer: Medicare Other

## 2018-02-26 DIAGNOSIS — I82402 Acute embolism and thrombosis of unspecified deep veins of left lower extremity: Secondary | ICD-10-CM

## 2018-02-26 DIAGNOSIS — N179 Acute kidney failure, unspecified: Secondary | ICD-10-CM

## 2018-02-26 DIAGNOSIS — T380X5A Adverse effect of glucocorticoids and synthetic analogues, initial encounter: Secondary | ICD-10-CM | POA: Diagnosis present

## 2018-02-26 DIAGNOSIS — R262 Difficulty in walking, not elsewhere classified: Secondary | ICD-10-CM | POA: Diagnosis present

## 2018-02-26 DIAGNOSIS — G959 Disease of spinal cord, unspecified: Secondary | ICD-10-CM | POA: Diagnosis not present

## 2018-02-26 DIAGNOSIS — I10 Essential (primary) hypertension: Secondary | ICD-10-CM | POA: Diagnosis present

## 2018-02-26 DIAGNOSIS — R05 Cough: Secondary | ICD-10-CM | POA: Diagnosis not present

## 2018-02-26 DIAGNOSIS — Z79899 Other long term (current) drug therapy: Secondary | ICD-10-CM | POA: Diagnosis not present

## 2018-02-26 DIAGNOSIS — I129 Hypertensive chronic kidney disease with stage 1 through stage 4 chronic kidney disease, or unspecified chronic kidney disease: Secondary | ICD-10-CM | POA: Diagnosis present

## 2018-02-26 DIAGNOSIS — Z86718 Personal history of other venous thrombosis and embolism: Secondary | ICD-10-CM | POA: Diagnosis not present

## 2018-02-26 DIAGNOSIS — E869 Volume depletion, unspecified: Secondary | ICD-10-CM | POA: Diagnosis present

## 2018-02-26 DIAGNOSIS — W19XXXD Unspecified fall, subsequent encounter: Secondary | ICD-10-CM | POA: Diagnosis not present

## 2018-02-26 DIAGNOSIS — Z7901 Long term (current) use of anticoagulants: Secondary | ICD-10-CM | POA: Diagnosis not present

## 2018-02-26 DIAGNOSIS — G8220 Paraplegia, unspecified: Secondary | ICD-10-CM

## 2018-02-26 DIAGNOSIS — R296 Repeated falls: Secondary | ICD-10-CM | POA: Diagnosis present

## 2018-02-26 DIAGNOSIS — K592 Neurogenic bowel, not elsewhere classified: Secondary | ICD-10-CM | POA: Diagnosis present

## 2018-02-26 DIAGNOSIS — W19XXXA Unspecified fall, initial encounter: Secondary | ICD-10-CM | POA: Diagnosis present

## 2018-02-26 DIAGNOSIS — N319 Neuromuscular dysfunction of bladder, unspecified: Secondary | ICD-10-CM | POA: Diagnosis present

## 2018-02-26 DIAGNOSIS — S14106D Unspecified injury at C6 level of cervical spinal cord, subsequent encounter: Secondary | ICD-10-CM | POA: Diagnosis not present

## 2018-02-26 DIAGNOSIS — N401 Enlarged prostate with lower urinary tract symptoms: Secondary | ICD-10-CM | POA: Diagnosis present

## 2018-02-26 DIAGNOSIS — R29898 Other symptoms and signs involving the musculoskeletal system: Secondary | ICD-10-CM | POA: Diagnosis not present

## 2018-02-26 DIAGNOSIS — Z4789 Encounter for other orthopedic aftercare: Secondary | ICD-10-CM | POA: Diagnosis present

## 2018-02-26 DIAGNOSIS — F919 Conduct disorder, unspecified: Secondary | ICD-10-CM | POA: Diagnosis present

## 2018-02-26 DIAGNOSIS — G9520 Unspecified cord compression: Secondary | ICD-10-CM | POA: Diagnosis not present

## 2018-02-26 DIAGNOSIS — Z7982 Long term (current) use of aspirin: Secondary | ICD-10-CM | POA: Diagnosis not present

## 2018-02-26 DIAGNOSIS — R471 Dysarthria and anarthria: Secondary | ICD-10-CM | POA: Diagnosis not present

## 2018-02-26 DIAGNOSIS — I82412 Acute embolism and thrombosis of left femoral vein: Secondary | ICD-10-CM | POA: Diagnosis present

## 2018-02-26 DIAGNOSIS — N189 Chronic kidney disease, unspecified: Secondary | ICD-10-CM | POA: Diagnosis present

## 2018-02-26 DIAGNOSIS — S14109S Unspecified injury at unspecified level of cervical spinal cord, sequela: Secondary | ICD-10-CM | POA: Diagnosis not present

## 2018-02-26 DIAGNOSIS — Z87820 Personal history of traumatic brain injury: Secondary | ICD-10-CM | POA: Diagnosis not present

## 2018-02-26 DIAGNOSIS — R339 Retention of urine, unspecified: Secondary | ICD-10-CM | POA: Diagnosis not present

## 2018-02-26 DIAGNOSIS — G9389 Other specified disorders of brain: Secondary | ICD-10-CM | POA: Diagnosis present

## 2018-02-26 DIAGNOSIS — Z7401 Bed confinement status: Secondary | ICD-10-CM | POA: Diagnosis not present

## 2018-02-26 DIAGNOSIS — G249 Dystonia, unspecified: Secondary | ICD-10-CM

## 2018-02-26 DIAGNOSIS — D62 Acute posthemorrhagic anemia: Secondary | ICD-10-CM | POA: Diagnosis not present

## 2018-02-26 DIAGNOSIS — N1330 Unspecified hydronephrosis: Secondary | ICD-10-CM | POA: Diagnosis present

## 2018-02-26 DIAGNOSIS — M50023 Cervical disc disorder at C6-C7 level with myelopathy: Secondary | ICD-10-CM | POA: Diagnosis present

## 2018-02-26 DIAGNOSIS — F1010 Alcohol abuse, uncomplicated: Secondary | ICD-10-CM | POA: Diagnosis present

## 2018-02-26 DIAGNOSIS — S14105S Unspecified injury at C5 level of cervical spinal cord, sequela: Secondary | ICD-10-CM | POA: Diagnosis not present

## 2018-02-26 DIAGNOSIS — E871 Hypo-osmolality and hyponatremia: Secondary | ICD-10-CM | POA: Diagnosis not present

## 2018-02-26 DIAGNOSIS — M48061 Spinal stenosis, lumbar region without neurogenic claudication: Secondary | ICD-10-CM | POA: Diagnosis present

## 2018-02-26 DIAGNOSIS — G9340 Encephalopathy, unspecified: Secondary | ICD-10-CM | POA: Diagnosis present

## 2018-02-26 DIAGNOSIS — N400 Enlarged prostate without lower urinary tract symptoms: Secondary | ICD-10-CM | POA: Diagnosis present

## 2018-02-26 DIAGNOSIS — S14129S Central cord syndrome at unspecified level of cervical spinal cord, sequela: Secondary | ICD-10-CM | POA: Diagnosis not present

## 2018-02-26 DIAGNOSIS — G312 Degeneration of nervous system due to alcohol: Secondary | ICD-10-CM | POA: Diagnosis present

## 2018-02-26 DIAGNOSIS — Z09 Encounter for follow-up examination after completed treatment for conditions other than malignant neoplasm: Secondary | ICD-10-CM | POA: Diagnosis not present

## 2018-02-26 DIAGNOSIS — M4712 Other spondylosis with myelopathy, cervical region: Secondary | ICD-10-CM | POA: Diagnosis present

## 2018-02-26 DIAGNOSIS — G255 Other chorea: Secondary | ICD-10-CM | POA: Diagnosis present

## 2018-02-26 DIAGNOSIS — D72829 Elevated white blood cell count, unspecified: Secondary | ICD-10-CM | POA: Diagnosis present

## 2018-02-26 LAB — CBC
HCT: 38.6 % — ABNORMAL LOW (ref 39.0–52.0)
Hemoglobin: 12.8 g/dL — ABNORMAL LOW (ref 13.0–17.0)
MCH: 29.6 pg (ref 26.0–34.0)
MCHC: 33.2 g/dL (ref 30.0–36.0)
MCV: 89.1 fL (ref 78.0–100.0)
Platelets: 258 10*3/uL (ref 150–400)
RBC: 4.33 MIL/uL (ref 4.22–5.81)
RDW: 12.9 % (ref 11.5–15.5)
WBC: 10.8 10*3/uL — ABNORMAL HIGH (ref 4.0–10.5)

## 2018-02-26 LAB — BASIC METABOLIC PANEL
Anion gap: 12 (ref 5–15)
BUN: 74 mg/dL — ABNORMAL HIGH (ref 8–23)
CO2: 21 mmol/L — ABNORMAL LOW (ref 22–32)
Calcium: 8 mg/dL — ABNORMAL LOW (ref 8.9–10.3)
Chloride: 100 mmol/L (ref 98–111)
Creatinine, Ser: 2.75 mg/dL — ABNORMAL HIGH (ref 0.61–1.24)
GFR calc Af Amer: 26 mL/min — ABNORMAL LOW (ref >60)
GFR calc non Af Amer: 22 mL/min — ABNORMAL LOW (ref >60)
Glucose, Bld: 122 mg/dL — ABNORMAL HIGH (ref 70–99)
Potassium: 3.6 mmol/L (ref 3.5–5.1)
Sodium: 133 mmol/L — ABNORMAL LOW (ref 135–145)

## 2018-02-26 LAB — HIV ANTIBODY (ROUTINE TESTING W REFLEX): HIV Screen 4th Generation wRfx: NONREACTIVE

## 2018-02-26 LAB — CK: Total CK: 97 U/L (ref 49–397)

## 2018-02-26 MED ORDER — LORAZEPAM 2 MG/ML IJ SOLN
1.0000 mg | Freq: Once | INTRAMUSCULAR | Status: DC
  Filled 2018-02-26 (×2): qty 1

## 2018-02-26 MED ORDER — GUAIFENESIN-DM 100-10 MG/5ML PO SYRP
5.0000 mL | ORAL_SOLUTION | Freq: Once | ORAL | Status: AC
  Administered 2018-02-26: 5 mL via ORAL
  Filled 2018-02-26: qty 5

## 2018-02-26 MED ORDER — PNEUMOCOCCAL VAC POLYVALENT 25 MCG/0.5ML IJ INJ
0.5000 mL | INJECTION | INTRAMUSCULAR | Status: DC
  Filled 2018-02-26: qty 0.5

## 2018-02-26 NOTE — Care Management Note (Signed)
Case Management Note  Patient Details  Name: Wayne Buckley MRN: [id number] Date of Birth: Jun 19, 1951  Subjective/Objective:             Pt presents s/p falls/bilateral LE weakness. Hx of subdural hematoma /Traumatic head injury, Hx of ETOH abuse, and HTN. From home with wife, Wayne Buckley. Per wife pt independent with ADL's  Prior to hospital visit. Owns a walker.     Pt with Acute DVT left LE left external iliac and below to the tibial veins.... Anti coagulation on hold until MRI complete of spine to exclude cord damage.   Wayne Buckley     [phone]       PCP: Wayne Buckley  Action/Plan: Neuro consut pending...  NCM following for transition of care need...  Expected Discharge Date:                  Expected Discharge Plan:     In-House Referral:     Discharge planning Services  CM Consult  Post Acute Care Choice:    Choice offered to:     DME Arranged:    DME Agency:     HH Arranged:    HH Agency:     Status of Service:  In process, will continue to follow  If discussed at Long Length of Stay Meetings, dates discussed:    Additional Comments:  Epifanio Lesches, RN 02/26/2018, 3:12 PM

## 2018-02-26 NOTE — Progress Notes (Signed)
OT Cancellation Note  Patient Details Name: Wayne Buckley MRN: [id number] DOB: [birth year]   Cancelled Treatment:     PNT WITH ACUTE DVT. WILL HOLD   Linn Goetze 02/26/2018, 8:55 AM

## 2018-02-26 NOTE — H&P (View-Only) (Signed)
[facility] CONSULT NOTE   MRN : [id number]  Reason for Consult: DVT LLE Referring Physician: Dr. Ogbata  History of Present Illness: 67 y/o male who feel 2 times last week due to gait problems since 2013 which resulted in a subdural hematoma after a fall while he was intoxicated.  He has been immobile in the bed since Thursday 02/21/2018.  He denise alcohol abuse for 2 years now, no cardiac history and no history of prior DVT or PE.    Past medical history includes: Traumatic head injury, Hx of ETOH abuse, and HTN.   He is not currently on prescription medication.  OTC post ETOH abuse vitamins.       Current Facility-Administered Medications  Medication Dose Route Frequency Provider Last Rate Last Dose  . 0.9 %  sodium chloride infusion   Intravenous Continuous Ogbata, Sylvester I, MD 125 mL/hr at 02/26/18 0500    . feeding supplement (ENSURE ENLIVE) (ENSURE ENLIVE) liquid 237 mL  237 mL Oral BID BM Ogbata, Sylvester I, MD   237 mL at 02/26/18 1016  . folic acid (FOLVITE) tablet 1 mg  1 mg Oral Daily Ogbata, Sylvester I, MD   1 mg at 02/26/18 1013  . multivitamin with minerals tablet 1 tablet  1 tablet Oral Daily Ogbata, Sylvester I, MD   1 tablet at 02/26/18 1013  . pantoprazole (PROTONIX) EC tablet 40 mg  40 mg Oral Q1200 Ogbata, Sylvester I, MD      . [START ON 02/27/2018] pneumococcal 23 valent vaccine (PNU-IMMUNE) injection 0.5 mL  0.5 mL Intramuscular Tomorrow-1000 Ogbata, Sylvester I, MD      . thiamine (VITAMIN B-1) tablet 250 mg  250 mg Oral Daily Ogbata, Sylvester I, MD   250 mg at 02/26/18 1013  . vitamin C (ASCORBIC ACID) tablet 1,000 mg  1,000 mg Oral BID Ogbata, Sylvester I, MD   1,000 mg at 02/26/18 1012    Pt meds include: Statin :No Betablocker: No ASA: No Other anticoagulants/antiplatelets: None  Past Medical History:  Diagnosis Date  . Hypertension   . TBI (traumatic brain injury) (HCC) 2013    Past Surgical History:  Procedure  Laterality Date  . CRANIOTOMY  04/28/2012   Procedure: CRANIOTOMY HEMATOMA EVACUATION SUBDURAL;  Surgeon: Randy O Kritzer, MD;  Location: [facility] OR;  Service: Neurosurgery;  Laterality: Left;  Left Craniotomy for Subdural Hematoma    Social History Social History   Tobacco Use  . Smoking status: Never Smoker  . Smokeless tobacco: Never Used  Substance Use Topics  . Alcohol use: Yes    Comment: daily   . Drug use: Not on file    Family History Family History  Problem Relation Age of Onset  . Heart disease Father     No Known Allergies   REVIEW OF SYSTEMS  General: [ ] Weight loss, [ ] Fever, [ ] chills Neurologic: [ ] Dizziness, [ ] Blackouts, [ ] Seizure [ ] Stroke, [ ] "Mini stroke", [ ] Slurred speech, [ ] Temporary blindness; [ ] weakness in arms or legs, [ ] Hoarseness [ ] Dysphagia [x] unsteady gait Cardiac: [ ] Chest pain/pressure, [ ] Shortness of breath at rest [ ] Shortness of breath with exertion, [ ] Atrial fibrillation or irregular heartbeat  Vascular: [ ] Pain in legs with walking, [ ] Pain in legs at rest, [ ] Pain in legs at night,  [ ] Non-healing ulcer, [x ] Blood clot in vein/DVT,     Pulmonary: [ ] Home oxygen, [ ] Productive cough, [ ] Coughing up blood, [ ] Asthma,  [ ] Wheezing [ ] COPD Musculoskeletal:  [ ] Arthritis, [ ] Low back pain, [ ] Joint pain Hematologic: [ ] Easy Bruising, [ ] Anemia; [x ] Hepatitis Gastrointestinal: [ ] Blood in stool, [ ] Gastroesophageal Reflux/heartburn, Urinary: [ ] chronic Kidney disease, [ ] on HD - [ ] MWF or [ ] TTHS, [ ] Burning with urination, [x ] Difficulty urinating Skin: [ ] Rashes, [ ] Wounds Psychological: [ ] Anxiety, [ ] Depression  Physical Examination Vitals:   02/25/18 1320 02/25/18 2039 02/25/18 2042 02/26/18 0500  BP:  (!) 138/93 (!) 138/93 (!) 152/89  Pulse:  92 91 91  Resp:  18  16  Temp:  98.2 F (36.8 C)  98.5 F (36.9 C)  TempSrc:  Oral  Oral  SpO2:  100%  99%  Weight: 91 kg     Height: 5'  11" (1.803 m)      Body mass index is 27.98 kg/m.  General:  WDWN in NAD HENT: WNL, normocephalic Eyes: Pupils equal Pulmonary: normal non-labored breathing , without Rales, rhonchi,  wheezing Cardiac: RRR, without  Murmurs, rubs or gallops; No carotid bruits Abdomen: soft, NT, no masses Skin: no rashes, ulcers noted;  no Gangrene , no cellulitis; no open wounds;   Vascular Exam/Pulses:Palpable Radial, brachial, femoral and DP pulse B    Musculoskeletal: no muscle wasting or atrophy; left LE edema  Neurologic: A&O X 3; Appropriate Affect ;  SENSATION: normal; patient states he feels touch normally in B LE MOTOR FUNCTION: will not move either LE Speech is slight slurred voice pattern baseline.   Significant Diagnostic Studies: CBC Lab Results  Component Value Date   WBC 10.8 (H) 02/26/2018   HGB 12.8 (L) 02/26/2018   HCT 38.6 (L) 02/26/2018   MCV 89.1 02/26/2018   PLT 258 02/26/2018    BMET    Component Value Date/Time   NA 133 (L) 02/26/2018 0407   K 3.6 02/26/2018 0407   CL 100 02/26/2018 0407   CO2 21 (L) 02/26/2018 0407   GLUCOSE 122 (H) 02/26/2018 0407   BUN 74 (H) 02/26/2018 0407   CREATININE 2.75 (H) 02/26/2018 0407   CALCIUM 8.0 (L) 02/26/2018 0407   GFRNONAA 22 (L) 02/26/2018 0407   GFRAA 26 (L) 02/26/2018 0407   Estimated Creatinine Clearance: 29.7 mL/min (A) (by C-G formula based on SCr of 2.75 mg/dL (H)).  COAG Lab Results  Component Value Date   INR 1.16 02/25/2018   INR 1.06 04/28/2012     Non-Invasive Vascular Imaging:  Venous duplex Positive for Acute deep veins thrombosis, involving left Common femoral vein, femoral vein, profunda vein, popliteal vein, peroneal veins, posterior tibial veins, gastrocnemius vein, superficial vein thrombosis includes greater saphenous vein.   Left external iliac vein also thrombosed. IVC not be able to visualized due to bowel gas obscures.  ASSESSMENT/PLAN:  Acute DVT left LE left external iliac and below  to the tibial veins. Likely secondary to immobility.  I ma not sure if he is a lysis patient verses IVC filter.    B LE paralysis no acute trauma noted on CT of the spine with s/p fall. Anti coagulation on hold until MRI complete of spine to exclude cord damage.   Emma Maureen Collins 02/26/2018 12:12 PM   I agree with the above.  I have seen and evaluated the patient.  He has an   extensive left leg DVT.  He is currently undergoing work-up for his lower extremity paralysis which is not related to his DVT.  Based on these results, definitive management of the DVT will be made.  I would like for him to be started on anticoagulation if possible.  If the results of his MRI suggest hematoma and he is not a candidate for anticoagulation, placement of an IVC filter could be performed.  If there are no contraindications for anticoagulation as well as TPA, he potentially could be a candidate for thrombolysis to help remove some of his clot burden.  I will follow-up tomorrow once his MRI has been completed.  Wells Jaliana Medellin 

## 2018-02-26 NOTE — Progress Notes (Signed)
Pt went to MRI, RN received call from MRI saying pt cannot stay still and they wont be able to perform test. Pt has Hx of TBI, Ativan ordered. After RN got Ativan ready to be administered, MRI called again. Per MRI tech, Neurologist saw pt, and does not wont to proceed with Ativan, because it wont help pt condition. Per neurologist test needs to be postponed for tomorrow and completed under anesthesia. Schorr, NP notified.

## 2018-02-26 NOTE — Progress Notes (Signed)
PROGRESS NOTE  Wayne Buckley ZOX:[id number] DOB: [birth year] DOA: 02/25/2018 PCP: [name], MD  Brief Narrative: 67 year old man PMH traumatic subdural hemorrhage, status post evacuation; alcohol abuse, urinary retention, presented after fall 9/5 with inability to walk thereafter.  Also noted to have left leg swelling.  Admitted for recurrent falls, acute kidney injury, volume depletion  Assessment/Plan Traumatic profound bilateral lower extremity weakness with preserved sensation but absent patellar reflexes. --CT thoracic spine negative.  CT lumbar spine moderate to severe spinal stenosis L4-5.  No acute abnormality of the lumbar spine.  CT head poor quality but no acute trauma noted.  CT cervical spine no acute traumatic injury. --Status post fall 9/5, since that time is been lying in bed unable to move his legs per history obtained from wife. --Etiology unclear.  Consultation to neurology placed.  Discussed with radiology, will obtain thoracic and lumbar spine MRI to rule out cord abnormality, hematoma which cannot be excluded based on CT findings.  Extensive left lower extremity DVT, common femoral femoral, femoral, profunda, popliteal , peroneal, posterior tibial, gastrocnemius, left external iliac. --We will need anticoagulation but given risk/benefit will hold off until MRI obtained and can exclude traumatic cord injury, hematoma.  Strict bedrest.  Cancel PT evaluation. --Given extensive nature of DVT, will discuss with vascular surgery for any further recommendations at this time.  Acute kidney injury.  Renal ultrasound shows bilateral mild to moderate hydronephrosis, uncertain if this represents bladder outlet obstruction and secondary bilateral hydronephrosis or pathology of bilateral distal ureters.  Chronic kidney disease suspected. --No significant change as far.  Continue aggressive IV fluids.  Pattern suggest prerenal etiology.  Place Foley catheter. --Daily BMP --CT showed  bilateral distended uterus and bladder.  May be due to bladder outlet obstruction or neurogenic bladder.  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 --Mentation and writhing movements at baseline per wife.  PMH alcohol abuse --Per wife "occasionally drinks a beer". --monitor   DVT prophylaxis: none (await MRI) Code Status: Full Family Communication: wife at bedside Disposition Plan: pending    Brendia Sacks, MD  [facility] Direct contact: [phone] --Via amion app OR  --[email or web address]; password [email or web address]  7PM-7AM contact night coverage as above 02/26/2018, 11:52 AM  LOS: 0 days   Consultants:  Neurology   VVS  Procedures:    Antimicrobials:    Interval history/Subjective: Denies complaints.  "I am recovering."  Tolerating diet.  Feels like his legs are getting better.  Objective: Vitals:  Vitals:   02/25/18 2042 02/26/18 0500  BP: (!) 138/93 (!) 152/89  Pulse: 91 91  Resp:  16  Temp:  98.5 F (36.9 C)  SpO2:  99%    Exam:  Constitutional:  . Appears calm and comfortable; writhing movements head and neck  Eyes:  . pupils and irises appear normal . Normal lids and conjunctivae ENMT:  . grossly normal hearing  . Lips appear normal Respiratory:  . CTA bilaterally, no w/r/r.  . Respiratory effort normal.  Cardiovascular:  . RRR, no m/r/g . 2+ left lower extremity edema up to the thigh . Dorsalis pedis pulses 2+ bilaterally Musculoskeletal:  . RUE, LUE, RLE, LLE   . Moves both upper extremities spontaneously with apparent normal strength and tone, writhing movements which are chronic . Unable to move either lower leg, perhaps very minimal movement of bilateral toes.  Absent patellar reflexes. Skin:  . No rashes,  lesions, ulcers . palpation of skin: no induration or nodules Neurologic:  . Sensation bilateral  lower extremities intact Psychiatric:  . Mental status o Mood appears appropriate, affect odd o Oriented to person, location, month, year . judgment and insight appear impaired   I have personally reviewed the following:   Data: . Creatinine 2.75, BUN 74, potassium 3.6 . Total CK 97, troponin negative . CBC unremarkable. Marland Kitchen TSH within normal limits. . Renal ultrasound noted . Chest x-ray no acute disease . Pelvic film no fracture identified.  Consider further imaging if patient unable to bear weight.  Records summary.  ED [facility] 9/3 at [facility].  Presented after falling down some stairs with pain right shoulder and neck area.  No focal weakness or numbness reported.  No loss of consciousness.  Notes records normal range of movement, motor and sensory exam lower extremities no specifically documented.  CT neck no acute fracture.  CT head no acute abnormality.  Diagnosed with fall, neck strain, upper back strain.  Scheduled Meds: . feeding supplement (ENSURE ENLIVE)  237 mL Oral BID BM  . folic acid  1 mg Oral Daily  . multivitamin with minerals  1 tablet Oral Daily  . pantoprazole  40 mg Oral Q1200  . [START ON 02/27/2018] pneumococcal 23 valent vaccine  0.5 mL Intramuscular Tomorrow-1000  . thiamine  250 mg Oral Daily  . vitamin C  1,000 mg Oral BID   Continuous Infusions: . sodium chloride 125 mL/hr at 02/26/18 0500    Principal Problem:   Bilateral leg weakness Active Problems:   Subdural hematoma, post-traumatic (HCC)   Urinary retention   Fall   AKI (acute kidney injury) (HCC)   Leg DVT (deep venous thromboembolism), acute, left (HCC)   LOS: 0 days    Time 50 minutes, greater than 50% in counseling, discussing treatment plan with wife, obtaining further history from wife, discussion with patient, discussion with neurologist in consultation, discussion with radiology to obtain the appropriate next studies, placement of vascular surgery consult.  Patient's critical  illness is profound bilateral lower extremity weakness.

## 2018-02-26 NOTE — Consult Note (Addendum)
[facility] NOTE   MRN : [id number]  Reason for Consult: DVT LLE Referring Physician: Dr. Dartha Lodge  History of Present Illness: 67 y/o male who feel 2 times last week due to gait problems since 2013 which resulted in a subdural hematoma after a fall while he was intoxicated.  He has been immobile in the bed since Thursday 02/21/2018.  He denise alcohol abuse for 2 years now, no cardiac history and no history of prior DVT or PE.    Past medical history includes: Traumatic head injury, Hx of ETOH abuse, and HTN.   He is not currently on prescription medication.  OTC post ETOH abuse vitamins.       Current Facility-Administered Medications  Medication Dose Route Frequency Provider Last Rate Last Dose  . 0.9 %  sodium chloride infusion   Intravenous Continuous Berton Mount I, MD 125 mL/hr at 02/26/18 0500    . feeding supplement (ENSURE ENLIVE) (ENSURE ENLIVE) liquid 237 mL  237 mL Oral BID BM Berton Mount I, MD   237 mL at 02/26/18 1016  . folic acid (FOLVITE) tablet 1 mg  1 mg Oral Daily Berton Mount I, MD   1 mg at 02/26/18 1013  . multivitamin with minerals tablet 1 tablet  1 tablet Oral Daily Berton Mount I, MD   1 tablet at 02/26/18 1013  . pantoprazole (PROTONIX) EC tablet 40 mg  40 mg Oral Q1200 Barnetta Chapel, MD      . Melene Muller ON 02/27/2018] pneumococcal 23 valent vaccine (PNU-IMMUNE) injection 0.5 mL  0.5 mL Intramuscular Tomorrow-1000 Berton Mount I, MD      . thiamine (VITAMIN B-1) tablet 250 mg  250 mg Oral Daily Berton Mount I, MD   250 mg at 02/26/18 1013  . vitamin C (ASCORBIC ACID) tablet 1,000 mg  1,000 mg Oral BID Berton Mount I, MD   1,000 mg at 02/26/18 1012    Pt meds include: Statin :No Betablocker: No ASA: No Other anticoagulants/antiplatelets: None  Past Medical History:  Diagnosis Date  . Hypertension   . TBI (traumatic brain injury) (HCC) 2013    Past Surgical History:  Procedure  Laterality Date  . CRANIOTOMY  04/28/2012   Procedure: CRANIOTOMY HEMATOMA EVACUATION SUBDURAL;  Surgeon: Reinaldo Meeker, MD;  Location: [facility] OR;  Service: Neurosurgery;  Laterality: Left;  Left Craniotomy for Subdural Hematoma    Social History Social History   Tobacco Use  . Smoking status: Never Smoker  . Smokeless tobacco: Never Used  Substance Use Topics  . Alcohol use: Yes    Comment: daily   . Drug use: Not on file    Family History Family History  Problem Relation Age of Onset  . Heart disease Father     No Known Allergies   REVIEW OF SYSTEMS  General: [ ]  Weight loss, [ ]  Fever, [ ]  chills Neurologic: [ ]  Dizziness, [ ]  Blackouts, [ ]  Seizure [ ]  Stroke, [ ]  "Mini stroke", [ ]  Slurred speech, [ ]  Temporary blindness; [ ]  weakness in arms or legs, [ ]  Hoarseness [ ]  Dysphagia [x]  unsteady gait Cardiac: [ ]  Chest pain/pressure, [ ]  Shortness of breath at rest [ ]  Shortness of breath with exertion, [ ]  Atrial fibrillation or irregular heartbeat  Vascular: [ ]  Pain in legs with walking, [ ]  Pain in legs at rest, [ ]  Pain in legs at night,  [ ]  Non-healing ulcer, [x ] Blood clot in vein/DVT,  Pulmonary: [ ]  Home oxygen, [ ]  Productive cough, [ ]  Coughing up blood, [ ]  Asthma,  [ ]  Wheezing [ ]  COPD Musculoskeletal:  [ ]  Arthritis, [ ]  Low back pain, [ ]  Joint pain Hematologic: [ ]  Easy Bruising, [ ]  Anemia; [x ] Hepatitis Gastrointestinal: [ ]  Blood in stool, [ ]  Gastroesophageal Reflux/heartburn, Urinary: [ ]  chronic Kidney disease, [ ]  on HD - [ ]  MWF or [ ]  TTHS, [ ]  Burning with urination, [x ] Difficulty urinating Skin: [ ]  Rashes, [ ]  Wounds Psychological: [ ]  Anxiety, [ ]  Depression  Physical Examination Vitals:   02/25/18 1320 02/25/18 2039 02/25/18 2042 02/26/18 0500  BP:  (!) 138/93 (!) 138/93 (!) 152/89  Pulse:  92 91 91  Resp:  18  16  Temp:  98.2 F (36.8 C)  98.5 F (36.9 C)  TempSrc:  Oral  Oral  SpO2:  100%  99%  Weight: 91 kg     Height: 5'  11" (1.803 m)      Body mass index is 27.98 kg/m.  General:  WDWN in NAD HENT: WNL, normocephalic Eyes: Pupils equal Pulmonary: normal non-labored breathing , without Rales, rhonchi,  wheezing Cardiac: RRR, without  Murmurs, rubs or gallops; No carotid bruits Abdomen: soft, NT, no masses Skin: no rashes, ulcers noted;  no Gangrene , no cellulitis; no open wounds;   Vascular Exam/Pulses:Palpable Radial, brachial, femoral and DP pulse B    Musculoskeletal: no muscle wasting or atrophy; left LE edema  Neurologic: A&O X 3; Appropriate Affect ;  SENSATION: normal; patient states he feels touch normally in B LE MOTOR FUNCTION: will not move either LE Speech is slight slurred voice pattern baseline.   Significant Diagnostic Studies: CBC Lab Results  Component Value Date   WBC 10.8 (H) 02/26/2018   HGB 12.8 (L) 02/26/2018   HCT 38.6 (L) 02/26/2018   MCV 89.1 02/26/2018   PLT 258 02/26/2018    BMET    Component Value Date/Time   NA 133 (L) 02/26/2018 0407   K 3.6 02/26/2018 0407   CL 100 02/26/2018 0407   CO2 21 (L) 02/26/2018 0407   GLUCOSE 122 (H) 02/26/2018 0407   BUN 74 (H) 02/26/2018 0407   CREATININE 2.75 (H) 02/26/2018 0407   CALCIUM 8.0 (L) 02/26/2018 0407   GFRNONAA 22 (L) 02/26/2018 0407   GFRAA 26 (L) 02/26/2018 0407   Estimated Creatinine Clearance: 29.7 mL/min (A) (by C-G formula based on SCr of 2.75 mg/dL (H)).  COAG Lab Results  Component Value Date   INR 1.16 02/25/2018   INR 1.06 04/28/2012     Non-Invasive Vascular Imaging:  Venous duplex Positive for Acute deep veins thrombosis, involving left Common femoral vein, femoral vein, profunda vein, popliteal vein, peroneal veins, posterior tibial veins, gastrocnemius vein, superficial vein thrombosis includes greater saphenous vein.   Left external iliac vein also thrombosed. IVC not be able to visualized due to bowel gas obscures.  ASSESSMENT/PLAN:  Acute DVT left LE left external iliac and below  to the tibial veins. Likely secondary to immobility.  I ma not sure if he is a lysis patient verses IVC filter.    B LE paralysis no acute trauma noted on CT of the spine with s/p fall. Anti coagulation on hold until MRI complete of spine to exclude cord damage.   Mosetta Pigeon 02/26/2018 12:12 PM   I agree with the above.  I have seen and evaluated the patient.  He has an  extensive left leg DVT.  He is currently undergoing work-up for his lower extremity paralysis which is not related to his DVT.  Based on these results, definitive management of the DVT will be made.  I would like for him to be started on anticoagulation if possible.  If the results of his MRI suggest hematoma and he is not a candidate for anticoagulation, placement of an IVC filter could be performed.  If there are no contraindications for anticoagulation as well as TPA, he potentially could be a candidate for thrombolysis to help remove some of his clot burden.  I will follow-up tomorrow once his MRI has been completed.  Durene Cal

## 2018-02-26 NOTE — Progress Notes (Signed)
   02/25/18 2039  Vitals  Temp 98.2 F (36.8 C)  Temp Source Oral  BP (!) 138/93  MAP (mmHg) 101  BP Location Right Arm  BP Method Automatic  Patient Position (if appropriate) Lying  Pulse Rate 92  Pulse Rate Source Monitor  Cardiac Rhythm NSR  Resp 18  Oxygen Therapy  SpO2 100 %  O2 Device Room Air  Pain Assessment  Pain Scale 0-10  Pain Score 0   Assumed care of pt from ED. VSS with no distress noted on arrival at the unit. Pt was Oriented to unit and how to call for assistance. Pt with scattered redness otherwise skin intact. Skin  Assessed with Yvone Neu

## 2018-02-26 NOTE — Consult Note (Addendum)
NEURO HOSPITALIST CONSULT NOTE   Requesting physician: Dr. Irene Limbo  Reason for Consult: Progressive BLE weakness with multiple falls  History obtained from:  Patient and Chart     HPI:                                                                                                                                          Wayne Buckley is an 67 y.o. male who presented to the ED on Monday afternoon with a c/c of bilateral lower extremity weakness with inability to stand up or sit up since a second of two major falls, the first on Tuesday 9/3, the second on Thursday 9/5.   Per his wife, the first fall occurred on Tuesday AM 9/3 which was a "really bad fall" down a flight of 8 steps onto a wooden floor and partly into a door without LOC or striking head. Fall was severe enough to make a very loud noise, waking up wife from sleep. No broken bones but resulted in whole body soreness. He went to an ED in [place], [place] and had a scan of his head, neck and "spine". Which wife states she was told did not show an indication for surgery.    Before the fall on Tuesday he was able to to walk on his own without a walker or other assistive device, but he had shuffling gait and appeared as though he would lose balance at times, but had not had a fall since the one 6 years previously that had resulted in the TBI.   After the fall on Tuesday, his ambulation was worse, but he was still able to walk with wife's assistance, which she describes essentially as a nearly max assist due to a combination of lower extremity weakness and lack of balance. Wife did not need to completely hold him up, but she did need to lift upwards to help keep him upright. He could take about 10-20 steps with the help of his wife between Tuesday and Thursday.  Denied dizziness.   His lower extremity function worsened further after a second fall on Thursday. Patient's wife had left the home on Thursday evening to run an  errand; he was sitting in a chair when she left. She came home to find in lying on the floor unable to stand up. After the fall on Thursday he was completely bedbound, unable to stand, transfer, sit up or even move his legs in the bed. He required a diaper and daily changes with wife performing turns to urinate as he had difficulty turning in bed on his own. Wife felt that sensation in the legs was diminished. Before the second fall there was no indication of urinary incontinence or other bladder problem other than relatively frequent urination that wife  felt was attributable to his age. After the fall he started to have difficulty with urination.   Wife also noticed that his left leg started to swell after the second fall on Thursday. Some aching of the leg was associated with this.   A second problem is longstanding abnormal movements of his face, upper and lower extremities that she describes as looking like an extreme case of fidgeting. She further clarifies that the abnormal fidgeting consists of frequent writhing movements of his upper extremities, lower extremities and face, which would stop during sleep. He was not evaluated for this.  These movements started after he had a fall 6 years ago resulting in brain trauma and cerebral bleed that required surgery to "remove a large part of the left side of his brain". He was in a coma after the fall and for a time after the surgery; wife cannot recall how long the coma was or if it was a medically induced coma - perhaps one week duration. He was in the hospital for about 1 month total, including rehab. No further falls since then until the fall on Tuesday.   CT head: 1. Although the study is severely degraded by patient motion, no acute trauma is evident. 2. Expected chronic encephalomalacia involving the anterior inferior left frontal lobe and left temporal tip. 3. Ex vacuo dilation of the left lateral ventricle. 4. Remote left frontal craniotomy. 5.  Remote right occipital skull fracture, stable.  CT cervical spine: 1. No acute traumatic injury to the cervical spine. 2. Study is mildly degraded by patient motion. 3. Slight progression of multilevel moderate to severe spondylosis as described.  CT thoracic spine: Image quality degraded by motion Negative for thoracic fracture  CT lumbar spine: 1. No acute abnormality of the lumbar spine. 2. Moderately severe spinal stenosis at L4-5. 3. Diffuse degenerative disc and joint disease in the lumbar spine as described. 4. Bilateral distended ureters and bladder. This may be due to bladder outlet obstruction or neurogenic bladder.  Lower extremity ultrasound: Right: No evidence of common femoral vein obstruction. Left: Findings consistent with acute deep vein thrombosis involving the left common femoral vein, left femoral vein, left proximal profunda vein, left popliteal vein, left posterior tibial vein, left peroneal vein, and left gastrocnemius vein. Findings consistent with acute superficial vein thrombosis involving the left great saphenous vein. No cystic structure found in the popliteal fossa.  Past Medical History:  Diagnosis Date  . Hypertension   . TBI (traumatic brain injury) (HCC) 2013    Past Surgical History:  Procedure Laterality Date  . CRANIOTOMY  04/28/2012   Procedure: CRANIOTOMY HEMATOMA EVACUATION SUBDURAL;  Surgeon: Reinaldo Meeker, MD;  Location: [facility] OR;  Service: Neurosurgery;  Laterality: Left;  Left Craniotomy for Subdural Hematoma    Family History  Problem Relation Age of Onset  . Heart disease Father              Social History:  reports that he has never smoked. He has never used smokeless tobacco. He reports that he drinks alcohol. His drug history is not on file.  No Known Allergies  MEDICATIONS:  Prior to Admission:  Medications  Prior to Admission  Medication Sig Dispense Refill Last Dose  . Ascorbic Acid (VITAMIN C) 1000 MG tablet Take 1,000 mg by mouth daily.    02/25/2018 at Unknown time  . aspirin EC 325 MG tablet Take 325 mg by mouth 2 (two) times daily.   02/25/2018 at Unknown time  . Multiple Vitamin (MULTIVITAMIN WITH MINERALS) TABS Take 1 tablet by mouth daily.   02/25/2018 at Unknown time  . feeding supplement (ENSURE COMPLETE) LIQD Take 237 mLs by mouth 2 (two) times daily between meals. (Patient not taking: Reported on 02/26/2018)   Not Taking at Unknown time  . folic acid (FOLVITE) 1 MG tablet Take 1 tablet (1 mg total) by mouth daily. (Patient not taking: Reported on 02/26/2018) 30 tablet 1 Not Taking at Unknown time  . pantoprazole (PROTONIX) 40 MG tablet Take 1 tablet (40 mg total) by mouth daily at 12 noon. (Patient not taking: Reported on 02/26/2018) 30 tablet 1 Not Taking at Unknown time  . thiamine 250 MG tablet Take 1 tablet (250 mg total) by mouth daily. (Patient not taking: Reported on 02/26/2018) 30 tablet 1 Not Taking at Unknown time   Scheduled: . feeding supplement (ENSURE ENLIVE)  237 mL Oral BID BM  . folic acid  1 mg Oral Daily  . LORazepam  1 mg Intravenous Once  . multivitamin with minerals  1 tablet Oral Daily  . pantoprazole  40 mg Oral Q1200  . pneumococcal 23 valent vaccine  0.5 mL Intramuscular Tomorrow-1000  . thiamine  250 mg Oral Daily  . vitamin C  1,000 mg Oral BID   Continuous: . sodium chloride 125 mL/hr at 02/26/18 1307     ROS:                                                                                                                                       Denies headache, vision change, confusion, speech deficit, CP, SOB. Other ROS as per HPI.   Blood pressure (!) 130/92, pulse 89, temperature 97.9 F (36.6 C), temperature source Oral, resp. rate 18, height 5\' 11"  (1.803 m), weight 91 kg, SpO2 97 %.   General Examination:                                                                                                        Physical Exam  HEENT-  [place]. Evidence of prior head trauma/surgery.    Lungs- Respirations unlabored Extremities-  Upper ext: warm and well perfused. RLE: Warm and well perfused. LLE: Positive for edema; skin cooler to touch than on right. Calf tender to palpation.   Neurological Examination Mental Status: Alert and awake. Fully oriented. Odd/absent affect. Poor attention. Poor eye contact. Speech fluent with intact comprehension and naming.  Cranial Nerves: II: Visual fields intact without extinction to DSS. PERRL.   III,IV, VI: EOMI without nystagmus. No ptosis.  V,VII: Nearly continuous oral dyskinesias. No asymmetry. Temp sensation equal bilaterally.  VIII: Hearing intact to voice IX,X: No hypophonia or hoarseness XI: Equal strength XII: midline tongue extension Motor: 5/5 bilateral upper extremities. Nearly continuous choreoathetoid movements of bilateral upper extremities from shoulders to digits, without asymmetry.  0/5 bilateral lower extremities from hips to toes, except for 2/5 toe movement bilaterally to command. Legs at baseline are adducted and externally rotated at hips, slightly flexed at knees and with drop-foot bilaterally, in the context of the severe weakness and flaccid tone.  Sensory:  Upper extremities: Normal sensation.  Lower extremities: Normal temp and FT sensation bilaterally from hips to feet. Absent proprioception to toes and knee joints bilaterally. Moderate to severely impaired vibration sensation to toes bilaterally.  Deep Tendon Reflexes:  2+ bilateral upper extremities Low amplitude patellar reflexes with crossed adductor responses (4+ bilaterally) Trace achilles reflexes bilaterally.  No clonus with forced dorsiflexion of ankles.  Plantars: Right: downgoing   Left: upgoing Cerebellar: Prominent ataxia with bilateral FNF. Unable to perform H-S.  Gait: Unable to assess.    Lab Results: Basic Metabolic  Panel: Recent Labs  Lab 02/25/18 1413 02/26/18 0407  NA 136 133*  K 4.2 3.6  CL 99 100  CO2 22 21*  GLUCOSE 111* 122*  BUN 73* 74*  CREATININE 2.79* 2.75*  CALCIUM 9.0 8.0*  MG 3.1*  --   PHOS 6.4*  --     CBC: Recent Labs  Lab 02/25/18 1413 02/26/18 0407  WBC 14.4* 10.8*  NEUTROABS 11.5*  --   HGB 14.7 12.8*  HCT 44.1 38.6*  MCV 89.3 89.1  PLT 291 258    Cardiac Enzymes: Recent Labs  Lab 02/26/18 0407  CKTOTAL 97    Lipid Panel: No results for input(s): CHOL, TRIG, HDL, CHOLHDL, VLDL, LDLCALC in the last 168 hours.  Imaging: Ct Head Wo Contrast  Result Date: 02/25/2018 CLINICAL DATA:  Head trauma, minor, GCS greater than 13. Altered level of consciousness, unexplained. Unwitnessed fall 4 days ago. EXAM: CT HEAD WITHOUT CONTRAST TECHNIQUE: Contiguous axial images were obtained from the base of the skull through the vertex without intravenous contrast. COMPARISON:  CT head without contrast,/15/13. FINDINGS: Brain: Chronic encephalomalacia involving the anterior left frontal lobe and temporal tip is noted. This corresponds with previous injury. Study is moderately degraded by patient motion. Basal ganglia are otherwise intact. Insular ribbon is normal bilaterally apart from the anterior encephalomalacia on the left. Study is nondiagnostic in the posterior fossa. No acute hemorrhage or mass lesion is present. The ventricles are proportionate to the degree of atrophy with ex vacuo dilation involving the left lateral ventricle. Vascular: Atherosclerotic calcifications are present within the cavernous internal carotid arteries. There is no hyperdense vessel. Skull: No definite fractures are present. A remote nondisplaced right occipital skull fracture is present. No new fractures are present. Sinuses/Orbits: The visualized paranasal sinuses and mastoid air cells are clear. No definite orbit trauma is present. IMPRESSION: 1. Although the study is severely degraded by patient  motion, no acute trauma is evident. 2. Expected chronic  encephalomalacia involving the anterior inferior left frontal lobe and left temporal tip. 3. Ex vacuo dilation of the left lateral ventricle. 4. Remote left frontal craniotomy. 5. Remote right occipital skull fracture, stable. Electronically Signed   By: Marin Roberts M.D.   On: 02/25/2018 16:48   Ct Cervical Spine Wo Contrast  Result Date: 02/25/2018 CLINICAL DATA:  Fall 4 days ago. Unable to ambulate since fall. Cervical spine trauma, high clinical risk. EXAM: CT CERVICAL SPINE WITHOUT CONTRAST TECHNIQUE: Multidetector CT imaging of the cervical spine was performed without intravenous contrast. Multiplanar CT image reconstructions were also generated. COMPARISON:  CT of the cervical spine 04/28/2012 FINDINGS: Alignment: Slight anterolisthesis at C2-3 is stable. AP alignment is otherwise anatomic. There straightening of the normal cervical lordosis. Skull base and vertebrae: The study is mildly degraded by patient motion. This obscures detail at the craniocervical junction. There is also some obscuration of detail in the lower cervical spine. No acute fracture present Soft tissues and spinal canal: Soft tissues the neck demonstrate atherosclerotic changes at the carotid bifurcations without significant stenosis. Thyroid is normal. No significant adenopathy is present. No soft tissue trauma is present. Disc levels: Left greater than right foraminal narrowing is again seen at C2-3, C3-4, C4-5, and C5-6 due to asymmetric uncovertebral and facet disease. Moderate to severe bilateral foraminal stenosis is present at C6-7. There is mild foraminal narrowing bilaterally at C7-T1. There is progressive stenosis since the prior study. Upper chest: The lung apices are clear. IMPRESSION: 1. No acute traumatic injury to the cervical spine. 2. Study is mildly degraded by patient motion. 3. Slight progression of multilevel moderate to severe spondylosis as  described. Electronically Signed   By: Marin Roberts M.D.   On: 02/25/2018 16:56   Ct Thoracic Spine Wo Contrast  Result Date: 02/25/2018 CLINICAL DATA:  Fall.  Thoracic spine fracture. EXAM: CT THORACIC SPINE WITHOUT CONTRAST TECHNIQUE: Multidetector CT images of the thoracic were obtained using the standard protocol without intravenous contrast. COMPARISON:  None. FINDINGS: Alignment: Normal alignment Image quality degraded by motion. Vertebrae: Negative for thoracic fracture Paraspinal and other soft tissues: No paraspinous mass or soft tissue edema. Visualized lungs are clear. Calcified lymph nodes in the mediastinum and left hilum. Coronary artery calcification. Disc levels: Prominent anterior and right lateral spurring from T4 through T12. Mild disc space narrowing in the thoracic spine. Small calcified central disc protrusion at T8-9 with mild facet degeneration. IMPRESSION: Image quality degraded by motion Negative for thoracic fracture Electronically Signed   By: Marlan Palau M.D.   On: 02/25/2018 16:40   Ct Lumbar Spine Wo Contrast  Result Date: 02/25/2018 CLINICAL DATA:  The patient has been unable to ambulate since a fall 4 days ago at home. EXAM: CT LUMBAR SPINE WITHOUT CONTRAST TECHNIQUE: Multidetector CT imaging of the lumbar spine was performed without intravenous contrast administration. Multiplanar CT image reconstructions were also generated. COMPARISON:  None. FINDINGS: Segmentation: 5 lumbar type vertebrae. Alignment: Normal. Vertebrae: No fractures. Diffuse severe degenerative disc disease in the lumbar spine with relative sparing at L5-S1. Paraspinal and other soft tissues: There is slight dilatation of both ureters with marked distention of the bladder. Slight soft tissue stranding around the left ureter at the L5-S1 level with a small amount of fluid in the left pericolic gutter at that level. This is best seen on images 107-119 of series 5. Aortic atherosclerosis. Disc  levels: T11-12: Slight disc space narrowing. Slight left facet arthritis. T12-L1: Disc space narrowing. Small calcified disc protrusion  to the left of midline. L1-2: Marked disc space narrowing. Small broad-based disc bulge without neural impingement. L2-3: Marked disc space narrowing. Small broad-based disc bulge with accompanying osteophytes extending into the right neural foramen without focal neural impingement. Slight narrowing of the spinal canal. L3-4: Marked disc space narrowing. Broad-based endplate osteophytes slightly compress the thecal sac. No focal neural impingement. Moderate bilateral facet arthritis. L4-5: Marked disc space narrowing with degenerative changes of the vertebral endplates. Broad-based endplate osteophytes with moderate bilateral foraminal stenosis, left greater than right with moderate bilateral facet arthritis. There is hypertrophy of the ligamentum flavum and facet joints which combine to create moderately severe spinal stenosis best seen on image 91 of series 4. L5-S1: Small broad-based disc bulge without neural impingement. Moderate bilateral facet arthritis, right greater than left. Moderate right foraminal stenosis. IMPRESSION: 1. No acute abnormality of the lumbar spine. 2. Moderately severe spinal stenosis at L4-5. 3. Diffuse degenerative disc and joint disease in the lumbar spine as described. 4. Bilateral distended ureters and bladder. This may be due to bladder outlet obstruction or neurogenic bladder. Electronically Signed   By: Francene Boyers M.D.   On: 02/25/2018 16:43   US Renal  Result Date: 02/26/2018 CLINICAL DATA:  67 year old male with a history of acute kidney injury EXAM: RENAL / URINARY TRACT ULTRASOUND COMPLETE COMPARISON:  None. FINDINGS: Right Kidney: Length: 16.2 cm. Mild-moderate right-sided hydronephrosis. Moderate right-sided cortical thinning. Echogenicity is similar to the adjacent liver parenchyma. Flow confirmed in the hilum of the right kidney.  Left Kidney: Length: 14.0 cm. Mild to moderate left-sided hydronephrosis. Echogenicity of the left renal cortex similar to the right. Moderate cortical thinning. Anechoic mass on the lateral cortex of the left kidney measures 3.8 cm x 5.4 cm x 4.3 cm with through transmission. No internal flow. No complexity. Flow confirmed in the hilum of the left kidney. Bladder: Urinary bladder distention. IMPRESSION: Bilateral mild to moderate hydronephrosis. Given the degree of bladder distention, uncertain if this represents bladder outlet obstruction and secondary bilateral hydronephrosis, or pathology of the bilateral distal ureters. Further evaluation with CT imaging recommended. Bilateral moderate renal cortical thinning indicating element of chronic kidney disease. Left-sided Bosniak 1 cyst. Electronically Signed   By: Gilmer Mor D.O.   On: 02/26/2018 10:24   Dg Chest Port 1 View  Result Date: 02/25/2018 CLINICAL DATA:  Leukocytosis, traumatic brain injury, unable to ambulate EXAM: PORTABLE CHEST 1 VIEW COMPARISON:  05/02/2012 FINDINGS: Minimal opacity in the right infrahilar lung. No pleural effusion. Normal heart size. No pneumothorax. Aortic atherosclerosis. IMPRESSION: Vague right infrahilar opacity, atelectasis versus minimal infiltrate. Electronically Signed   By: Jasmine Pang M.D.   On: 02/25/2018 20:33   Dg Hip Unilat W Or Wo Pelvis 2-3 Views Left  Result Date: 02/25/2018 CLINICAL DATA:  Fall, left leg pain. EXAM: DG HIP (WITH OR WITHOUT PELVIS) 2-3V LEFT COMPARISON:  None. FINDINGS: Hernia mesh markers noted along the lower pelvis. Lower lumbar spondylosis and degenerative disc disease. Spurring of both femoral heads. The patient's hips are both internally rotated on the pelvis and frontal projections, which can reduce sensitivity detecting fracture. No bony pelvic fracture is observed. There is spurring of the left femoral head causing a sclerotic edge extending across the femoral neck bilaterally. I  do not see discrete cortical discontinuity to suggest a fracture. Vascular calcification noted. IMPRESSION: 1. No fracture identified. Mildly reduced sensitivity due to internal rotation of the hip on the frontal projections. If the patient is not able to  bear weight then other complementary imaging (preferably MRI, which has a greater sensitivity than CT) should be considered. 2. Atherosclerosis. 3. Lower lumbar spondylosis and degenerative disc disease. 4. Degenerative spurring of both femoral heads. Electronically Signed   By: Gaylyn Rong M.D.   On: 02/25/2018 15:44    Assessment: 67 year old male with bilateral lower extremity paralysis since a fall last Thursday.  1. Initially experienced sudden stepwise decline with bilateral lower extremity weakness after first fall on Tuesday, then sudden further worsening to complete paralysis (paraplegia) after a second fall on Thursday.  2. 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  3. Exam findings best localize as bilateral anterior and lateral column thoracic spinal cord lesion, with posterior column damage as well. There is relative sparing of the lateral spinothalamic tracts from the lower extremities given intact temp and FT sensation.  4. Imaging findings consistent with neurogenic bladder. Most likely due to spinal cord lesion, as discussed in #2 and #3 above.  5. CT of cervical, thoracic and lumbar spine show degenerative spondylosis with vertebral canal narrowing but no definite cord compression. There is moderately severe spinal stenosis at L4-5. MRI will be needed to assess further.  6. History of TBI 6 years ago. CT head shows expected chronic encephalomalacia involving the anterior inferior left frontal lobe and left temporal tip, ex vacuo dilation of the left lateral ventricle, remote left  frontal craniotomy and remote right occipital skull fracture. 7. Acute DVT of LLE, most likely secondary to immobility in the setting of paraplegia.   Recommendations: 1. Unable to perform MRI and obtain diagnostic images given involuntary choreoathetoid movements, which will severely degrade the images. Will need anesthesia consult in the AM, as soon as possible, to arrange for MRI of cervical, thoracic and lumbar spine under anesthesia. Of note, choreoathetosis, being due to basal ganglia pathology, subsides during sleep/unconscious state so anesthesia is needed.  2. Further recommendations pending MRI results.  3. Agree with holding therapeutic anticoagulation until MRI spine rules out epidural hematoma. For now, SCD to right lower extremity for DVT prophylaxis. If significant delay to obtaining MRI, may need IVC filter.  4. Neurogenic bladder management per primary team.  5. Will need outpatient follow up regarding possible pharmacological management of the patient's chronic post-traumatic choreoathetosis.  6. Discussed with Tama Gander, NP   Electronically signed: Dr. Caryl Pina 02/26/2018, 8:25 PM

## 2018-02-26 NOTE — Progress Notes (Signed)
PT Cancellation Note  Patient Details Name: PAIDEN SHORTRIDGE MRN: [id number] DOB: 1950/12/05   Cancelled Treatment:    Reason Eval/Treat Not Completed: Medical issues which prohibited therapy. Pt with acute DVT. Not anticoagulated. Will attempt another day.    Sanjuana Letters 02/26/2018, 9:54 AM   Sanjuana Letters SPT 02/26/2018

## 2018-02-26 NOTE — Progress Notes (Signed)
Patients wife would like to talk to the neurologist.  Her number is [phone]

## 2018-02-27 ENCOUNTER — Inpatient Hospital Stay (HOSPITAL_COMMUNITY): Payer: Medicare Other | Admitting: Anesthesiology

## 2018-02-27 ENCOUNTER — Inpatient Hospital Stay (HOSPITAL_COMMUNITY): Payer: Medicare Other | Admitting: Certified Registered"

## 2018-02-27 ENCOUNTER — Inpatient Hospital Stay (HOSPITAL_COMMUNITY)
Admission: EM | Disposition: A | Discharge status: Other Acute Inpatient Hospital | Payer: Self-pay | Source: Home / Self Care | Attending: Internal Medicine

## 2018-02-27 ENCOUNTER — Encounter (HOSPITAL_COMMUNITY)
Admission: EM | Disposition: A | Discharge status: Other Acute Inpatient Hospital | Payer: Self-pay | Source: Home / Self Care | Attending: Internal Medicine

## 2018-02-27 ENCOUNTER — Inpatient Hospital Stay (HOSPITAL_COMMUNITY): Payer: Medicare Other

## 2018-02-27 ENCOUNTER — Encounter (HOSPITAL_COMMUNITY): Payer: Self-pay | Admitting: Certified Registered Nurse Anesthetist

## 2018-02-27 DIAGNOSIS — G9520 Unspecified cord compression: Secondary | ICD-10-CM

## 2018-02-27 DIAGNOSIS — R29898 Other symptoms and signs involving the musculoskeletal system: Secondary | ICD-10-CM

## 2018-02-27 HISTORY — PX: ANTERIOR CERVICAL DECOMP/DISCECTOMY FUSION: SHX1161

## 2018-02-27 HISTORY — PX: VENA CAVA FILTER PLACEMENT: SHX1085

## 2018-02-27 HISTORY — PX: RADIOLOGY WITH ANESTHESIA: SHX6223

## 2018-02-27 LAB — BASIC METABOLIC PANEL
Anion gap: 10 (ref 5–15)
BUN: 35 mg/dL — AB (ref 8–23)
CO2: 23 mmol/L (ref 22–32)
Calcium: 8.3 mg/dL — ABNORMAL LOW (ref 8.9–10.3)
Chloride: 110 mmol/L (ref 98–111)
Creatinine, Ser: 1.05 mg/dL (ref 0.61–1.24)
GFR calc Af Amer: >60 mL/min (ref >60)
GFR calc non Af Amer: >60 mL/min (ref >60)
Glucose, Bld: 139 mg/dL — ABNORMAL HIGH (ref 70–99)
Potassium: 4.6 mmol/L (ref 3.5–5.1)
Sodium: 143 mmol/L (ref 135–145)

## 2018-02-27 LAB — CBC
HEMATOCRIT: 37.5 % — AB (ref 39.0–52.0)
HEMOGLOBIN: 12.1 g/dL — AB (ref 13.0–17.0)
MCH: 29.2 pg (ref 26.0–34.0)
MCHC: 32.3 g/dL (ref 30.0–36.0)
MCV: 90.6 fL (ref 78.0–100.0)
Platelets: 320 10*3/uL (ref 150–400)
RBC: 4.14 MIL/uL — ABNORMAL LOW (ref 4.22–5.81)
RDW: 13.2 % (ref 11.5–15.5)
WBC: 12.5 10*3/uL — ABNORMAL HIGH (ref 4.0–10.5)

## 2018-02-27 SURGERY — MRI WITH ANESTHESIA
Anesthesia: General

## 2018-02-27 SURGERY — INSERTION, FILTER, VENA CAVA
Anesthesia: General | Site: Groin | Laterality: Right

## 2018-02-27 SURGERY — ANTERIOR CERVICAL DECOMPRESSION/DISCECTOMY FUSION 1 LEVEL
Anesthesia: General | Site: Neck

## 2018-02-27 MED ORDER — IODIXANOL 320 MG/ML IV SOLN
INTRAVENOUS | Status: DC | PRN
  Administered 2018-02-27: 10 mL

## 2018-02-27 MED ORDER — LIDOCAINE 2% (20 MG/ML) 5 ML SYRINGE
INTRAMUSCULAR | Status: DC | PRN
  Administered 2018-02-27: 60 mg via INTRAVENOUS

## 2018-02-27 MED ORDER — SODIUM CHLORIDE 0.9 % IV SOLN
INTRAVENOUS | Status: AC
  Filled 2018-02-27: qty 1.2

## 2018-02-27 MED ORDER — DEXAMETHASONE SODIUM PHOSPHATE 4 MG/ML IJ SOLN
4.0000 mg | Freq: Four times a day (QID) | INTRAMUSCULAR | Status: DC
  Administered 2018-02-28 – 2018-03-03 (×11): 4 mg via INTRAVENOUS
  Filled 2018-02-27: qty 0.4
  Filled 2018-02-27 (×11): qty 1

## 2018-02-27 MED ORDER — DEXAMETHASONE SODIUM PHOSPHATE 10 MG/ML IJ SOLN
INTRAMUSCULAR | Status: DC | PRN
  Administered 2018-02-27: 4 mg via INTRAVENOUS

## 2018-02-27 MED ORDER — DEXAMETHASONE SODIUM PHOSPHATE 10 MG/ML IJ SOLN
10.0000 mg | Freq: Four times a day (QID) | INTRAMUSCULAR | Status: DC
  Administered 2018-02-27: 10 mg via INTRAVENOUS
  Filled 2018-02-27: qty 1

## 2018-02-27 MED ORDER — FLUMAZENIL 0.5 MG/5ML IV SOLN
INTRAVENOUS | Status: AC
  Filled 2018-02-27: qty 5

## 2018-02-27 MED ORDER — PROPOFOL 10 MG/ML IV BOLUS
INTRAVENOUS | Status: DC | PRN
  Administered 2018-02-27: 90 mg via INTRAVENOUS

## 2018-02-27 MED ORDER — LACTATED RINGERS IV SOLN: INTRAVENOUS | Status: DC

## 2018-02-27 MED ORDER — LIDOCAINE 2% (20 MG/ML) 5 ML SYRINGE
INTRAMUSCULAR | Status: AC
  Filled 2018-02-27: qty 5

## 2018-02-27 MED ORDER — PROPOFOL 10 MG/ML IV BOLUS
INTRAVENOUS | Status: AC
  Filled 2018-02-27: qty 20

## 2018-02-27 MED ORDER — 0.9 % SODIUM CHLORIDE (POUR BTL) OPTIME
TOPICAL | Status: DC | PRN
  Administered 2018-02-27: 1000 mL

## 2018-02-27 MED ORDER — ONDANSETRON HCL 4 MG/2ML IJ SOLN: 4.0000 mg | Freq: Once | INTRAMUSCULAR | Status: DC | PRN

## 2018-02-27 MED ORDER — FAMOTIDINE IN NACL 20-0.9 MG/50ML-% IV SOLN
20.0000 mg | INTRAVENOUS | Status: DC
  Filled 2018-02-27: qty 50

## 2018-02-27 MED ORDER — EPHEDRINE 5 MG/ML INJ
INTRAVENOUS | Status: AC
  Filled 2018-02-27: qty 10

## 2018-02-27 MED ORDER — DEXAMETHASONE SODIUM PHOSPHATE 10 MG/ML IJ SOLN
6.0000 mg | Freq: Four times a day (QID) | INTRAMUSCULAR | Status: AC
  Filled 2018-02-27: qty 0.6

## 2018-02-27 MED ORDER — SODIUM CHLORIDE 0.9 % IV SOLN
INTRAVENOUS | Status: DC | PRN
  Administered 2018-02-27: 500 mL

## 2018-02-27 MED ORDER — PROPOFOL 10 MG/ML IV BOLUS
INTRAVENOUS | Status: DC | PRN
  Administered 2018-02-27: 200 mg via INTRAVENOUS

## 2018-02-27 MED ORDER — THROMBIN 5000 UNITS EX SOLR
CUTANEOUS | Status: AC
  Filled 2018-02-27: qty 5000

## 2018-02-27 MED ORDER — CEFAZOLIN SODIUM 1 G IJ SOLR
INTRAMUSCULAR | Status: AC
  Filled 2018-02-27: qty 20

## 2018-02-27 MED ORDER — SODIUM CHLORIDE 0.9 % IV SOLN
INTRAVENOUS | Status: DC
  Administered 2018-02-27 – 2018-03-04 (×3): via INTRAVENOUS

## 2018-02-27 MED ORDER — PHENYLEPHRINE 40 MCG/ML (10ML) SYRINGE FOR IV PUSH (FOR BLOOD PRESSURE SUPPORT)
PREFILLED_SYRINGE | INTRAVENOUS | Status: AC
  Filled 2018-02-27: qty 10

## 2018-02-27 MED ORDER — CISATRACURIUM BESYLATE 20 MG/10ML IV SOLN
INTRAVENOUS | Status: AC
  Filled 2018-02-27: qty 20

## 2018-02-27 MED ORDER — FENTANYL CITRATE (PF) 100 MCG/2ML IJ SOLN
INTRAMUSCULAR | Status: DC | PRN
  Administered 2018-02-27: 50 ug via INTRAVENOUS

## 2018-02-27 MED ORDER — LIDOCAINE 2% (20 MG/ML) 5 ML SYRINGE
INTRAMUSCULAR | Status: DC | PRN
  Administered 2018-02-27: 100 mg via INTRAVENOUS

## 2018-02-27 MED ORDER — SODIUM CHLORIDE 0.9 % IV SOLN
0.5000 ug/kg/min | INTRAVENOUS | Status: DC
  Filled 2018-02-27 (×3): qty 20

## 2018-02-27 MED ORDER — FENTANYL CITRATE (PF) 250 MCG/5ML IJ SOLN
INTRAMUSCULAR | Status: AC
  Filled 2018-02-27: qty 5

## 2018-02-27 MED ORDER — ROCURONIUM BROMIDE 50 MG/5ML IV SOSY
PREFILLED_SYRINGE | INTRAVENOUS | Status: DC | PRN
  Administered 2018-02-27: 50 mg via INTRAVENOUS

## 2018-02-27 MED ORDER — ONDANSETRON HCL 4 MG/2ML IJ SOLN
INTRAMUSCULAR | Status: DC | PRN
  Administered 2018-02-27: 4 mg via INTRAVENOUS

## 2018-02-27 MED ORDER — FENTANYL CITRATE (PF) 100 MCG/2ML IJ SOLN: 25.0000 ug | INTRAMUSCULAR | Status: DC | PRN

## 2018-02-27 MED ORDER — SUGAMMADEX SODIUM 200 MG/2ML IV SOLN
INTRAVENOUS | Status: DC | PRN
  Administered 2018-02-27: 200 mg via INTRAVENOUS

## 2018-02-27 MED ORDER — LACTATED RINGERS IV SOLN
INTRAVENOUS | Status: DC | PRN
  Administered 2018-02-27: 11:00:00 via INTRAVENOUS

## 2018-02-27 MED ORDER — SODIUM CHLORIDE 0.9 % IV SOLN
INTRAVENOUS | Status: DC
  Administered 2018-02-27: 17:00:00 via INTRAVENOUS

## 2018-02-27 MED ORDER — FENTANYL CITRATE (PF) 100 MCG/2ML IJ SOLN
INTRAMUSCULAR | Status: DC | PRN
  Administered 2018-02-27: 100 ug via INTRAVENOUS

## 2018-02-27 MED ORDER — ROCURONIUM BROMIDE 50 MG/5ML IV SOSY
PREFILLED_SYRINGE | INTRAVENOUS | Status: AC
  Filled 2018-02-27: qty 5

## 2018-02-27 SURGICAL SUPPLY — 66 items
BAG DECANTER FOR FLEXI CONT (MISCELLANEOUS) ×3 IMPLANT
BASKET BONE COLLECTION (BASKET) ×3 IMPLANT
BENZOIN TINCTURE PRP APPL 2/3 (GAUZE/BANDAGES/DRESSINGS) ×3 IMPLANT
BIT DRILL NEURO 2X3.1 SFT TUCH (MISCELLANEOUS) ×1 IMPLANT
BUR MATCHSTICK NEURO 3.0 LAGG (BURR) ×3 IMPLANT
CANISTER SUCT 3000ML PPV (MISCELLANEOUS) ×3 IMPLANT
CARTRIDGE OIL MAESTRO DRILL (MISCELLANEOUS) ×1 IMPLANT
CLOSURE STERI-STRIP 1/2X4 (GAUZE/BANDAGES/DRESSINGS) ×1
CLOSURE WOUND 1/2 X4 (GAUZE/BANDAGES/DRESSINGS) ×1
CLSR STERI-STRIP ANTIMIC 1/2X4 (GAUZE/BANDAGES/DRESSINGS) ×1 IMPLANT
DERMABOND ADVANCED (GAUZE/BANDAGES/DRESSINGS) ×2
DERMABOND ADVANCED .7 DNX12 (GAUZE/BANDAGES/DRESSINGS) IMPLANT
DIFFUSER DRILL AIR PNEUMATIC (MISCELLANEOUS) ×3 IMPLANT
DRAPE C-ARM 42X72 X-RAY (DRAPES) ×6 IMPLANT
DRAPE LAPAROTOMY 100X72 PEDS (DRAPES) ×3 IMPLANT
DRAPE MICROSCOPE LEICA (MISCELLANEOUS) ×3 IMPLANT
DRILL NEURO 2X3.1 SOFT TOUCH (MISCELLANEOUS) ×3
DRSG OPSITE POSTOP 4X6 (GAUZE/BANDAGES/DRESSINGS) ×2 IMPLANT
DURAPREP 6ML APPLICATOR 50/CS (WOUND CARE) ×3 IMPLANT
ELECT COATED BLADE 2.86 ST (ELECTRODE) ×3 IMPLANT
ELECT REM PT RETURN 9FT ADLT (ELECTROSURGICAL) ×3
ELECTRODE REM PT RTRN 9FT ADLT (ELECTROSURGICAL) ×1 IMPLANT
GAUZE 4X4 16PLY RFD (DISPOSABLE) IMPLANT
GAUZE SPONGE 4X4 12PLY STRL (GAUZE/BANDAGES/DRESSINGS) ×3 IMPLANT
GLOVE BIO SURGEON STRL SZ 6.5 (GLOVE) ×1 IMPLANT
GLOVE BIO SURGEON STRL SZ7 (GLOVE) ×2 IMPLANT
GLOVE BIO SURGEON STRL SZ8 (GLOVE) ×3 IMPLANT
GLOVE BIO SURGEONS STRL SZ 6.5 (GLOVE) ×1
GLOVE BIOGEL PI IND STRL 7.0 (GLOVE) IMPLANT
GLOVE BIOGEL PI INDICATOR 7.0 (GLOVE)
GLOVE EXAM NITRILE LRG STRL (GLOVE) IMPLANT
GLOVE EXAM NITRILE XL STR (GLOVE) IMPLANT
GLOVE EXAM NITRILE XS STR PU (GLOVE) IMPLANT
GLOVE INDICATOR 8.5 STRL (GLOVE) ×3 IMPLANT
GOWN STRL REUS W/ TWL LRG LVL3 (GOWN DISPOSABLE) ×1 IMPLANT
GOWN STRL REUS W/ TWL XL LVL3 (GOWN DISPOSABLE) ×1 IMPLANT
GOWN STRL REUS W/TWL 2XL LVL3 (GOWN DISPOSABLE) ×3 IMPLANT
GOWN STRL REUS W/TWL LRG LVL3 (GOWN DISPOSABLE) ×2
GOWN STRL REUS W/TWL XL LVL3 (GOWN DISPOSABLE) ×2
HALTER HD/CHIN CERV TRACTION D (MISCELLANEOUS) ×3 IMPLANT
HEMOSTAT POWDER KIT SURGIFOAM (HEMOSTASIS) ×3 IMPLANT
KIT BASIN OR (CUSTOM PROCEDURE TRAY) ×3 IMPLANT
KIT TURNOVER KIT B (KITS) ×3 IMPLANT
NDL HYPO 18GX1.5 BLUNT FILL (NEEDLE) ×1 IMPLANT
NDL SPNL 20GX3.5 QUINCKE YW (NEEDLE) ×1 IMPLANT
NEEDLE HYPO 18GX1.5 BLUNT FILL (NEEDLE) IMPLANT
NEEDLE SPNL 20GX3.5 QUINCKE YW (NEEDLE) ×3 IMPLANT
NS IRRIG 1000ML POUR BTL (IV SOLUTION) ×3 IMPLANT
OIL CARTRIDGE MAESTRO DRILL (MISCELLANEOUS) ×3
PACK LAMINECTOMY NEURO (CUSTOM PROCEDURE TRAY) ×3 IMPLANT
PAD ARMBOARD 7.5X6 YLW CONV (MISCELLANEOUS) ×9 IMPLANT
PIN DISTRACTION 14MM (PIN) ×4 IMPLANT
PLATE ANT CERV XTEND 1 LV 16 (Plate) ×2 IMPLANT
PUTTY BONE DBX 2.5 MIS (Bone Implant) ×2 IMPLANT
RUBBERBAND STERILE (MISCELLANEOUS) ×6 IMPLANT
SCREW VAR 4.2 XD SELF DRILL 16 (Screw) ×8 IMPLANT
SPACER ACDF COLONIAL 12X14X9 (Spacer) ×2 IMPLANT
SPONGE INTESTINAL PEANUT (DISPOSABLE) ×3 IMPLANT
SPONGE SURGIFOAM ABS GEL SZ50 (HEMOSTASIS) ×5 IMPLANT
STRIP CLOSURE SKIN 1/2X4 (GAUZE/BANDAGES/DRESSINGS) ×2 IMPLANT
SUT VIC AB 3-0 SH 8-18 (SUTURE) ×3 IMPLANT
SUT VICRYL 4-0 PS2 18IN ABS (SUTURE) ×3 IMPLANT
TAPE CLOTH 4X10 WHT NS (GAUZE/BANDAGES/DRESSINGS) ×2 IMPLANT
TOWEL GREEN STERILE (TOWEL DISPOSABLE) ×3 IMPLANT
TOWEL GREEN STERILE FF (TOWEL DISPOSABLE) ×3 IMPLANT
WATER STERILE IRR 1000ML POUR (IV SOLUTION) ×3 IMPLANT

## 2018-02-27 SURGICAL SUPPLY — 50 items
BAG DECANTER FOR FLEXI CONT (MISCELLANEOUS) ×3 IMPLANT
BLADE SURG 11 STRL SS (BLADE) ×3 IMPLANT
CATH OMNI FLUSH 5F 65CM (CATHETERS) ×2 IMPLANT
CATH VISIONS PV .035 IVUS (CATHETERS) ×2 IMPLANT
COVER BACK TABLE 60X90IN (DRAPES) ×3 IMPLANT
COVER DOME SNAP 22 D (MISCELLANEOUS) ×2 IMPLANT
COVER PROBE W GEL 5X96 (DRAPES) ×3 IMPLANT
COVER SURGICAL LIGHT HANDLE (MISCELLANEOUS) ×2 IMPLANT
DECANTER SPIKE VIAL GLASS SM (MISCELLANEOUS) ×1 IMPLANT
DERMABOND ADVANCED (GAUZE/BANDAGES/DRESSINGS) ×2
DERMABOND ADVANCED .7 DNX12 (GAUZE/BANDAGES/DRESSINGS) ×1 IMPLANT
DRAPE C-ARM 42X72 X-RAY (DRAPES) ×1 IMPLANT
DRAPE FEMORAL ANGIO 80X135IN (DRAPES) ×2 IMPLANT
DRAPE LAPAROTOMY T 102X78X121 (DRAPES) ×3 IMPLANT
FILTER VC CELECT-FEMORAL (Filter) ×2 IMPLANT
GAUZE 4X4 16PLY RFD (DISPOSABLE) ×3 IMPLANT
GAUZE SPONGE 4X4 12PLY STRL (GAUZE/BANDAGES/DRESSINGS) ×3 IMPLANT
GLOVE BIOGEL PI IND STRL 6.5 (GLOVE) IMPLANT
GLOVE BIOGEL PI IND STRL 7.5 (GLOVE) ×1 IMPLANT
GLOVE BIOGEL PI INDICATOR 6.5 (GLOVE) ×2
GLOVE BIOGEL PI INDICATOR 7.5 (GLOVE) ×2
GLOVE SURG SS PI 7.5 STRL IVOR (GLOVE) ×3 IMPLANT
GOWN STRL REUS W/ TWL LRG LVL3 (GOWN DISPOSABLE) ×1 IMPLANT
GOWN STRL REUS W/ TWL XL LVL3 (GOWN DISPOSABLE) ×1 IMPLANT
GOWN STRL REUS W/TWL LRG LVL3 (GOWN DISPOSABLE) ×4
GOWN STRL REUS W/TWL XL LVL3 (GOWN DISPOSABLE) ×2
KIT BASIN OR (CUSTOM PROCEDURE TRAY) ×3 IMPLANT
KIT TURNOVER KIT B (KITS) ×3 IMPLANT
NDL HYPO 25GX1X1/2 BEV (NEEDLE) ×1 IMPLANT
NDL PERC 18GX7CM (NEEDLE) ×1 IMPLANT
NEEDLE HYPO 25GX1X1/2 BEV (NEEDLE) ×3 IMPLANT
NEEDLE PERC 18GX7CM (NEEDLE) ×3 IMPLANT
NS IRRIG 1000ML POUR BTL (IV SOLUTION) ×3 IMPLANT
PACK SURGICAL SETUP 50X90 (CUSTOM PROCEDURE TRAY) ×3 IMPLANT
PAD ARMBOARD 7.5X6 YLW CONV (MISCELLANEOUS) ×6 IMPLANT
SHEATH PINNACLE 8F 10CM (SHEATH) ×2 IMPLANT
SLEEVE ISOL F/PACE RF HD COVER (MISCELLANEOUS) ×2 IMPLANT
STOPCOCK MORSE 400PSI 3WAY (MISCELLANEOUS) ×2 IMPLANT
SUT ETHILON 3 0 PS 1 (SUTURE) ×2 IMPLANT
SUT VICRYL 4-0 PS2 18IN ABS (SUTURE) ×3 IMPLANT
SYR 10ML LL (SYRINGE) ×4 IMPLANT
SYR 20CC LL (SYRINGE) ×2 IMPLANT
SYR 30ML LL (SYRINGE) ×1 IMPLANT
SYR 5ML LL (SYRINGE) ×3 IMPLANT
SYR CONTROL 10ML LL (SYRINGE) ×1 IMPLANT
TOWEL GREEN STERILE (TOWEL DISPOSABLE) ×3 IMPLANT
TUBING HIGH PRESSURE 120CM (CONNECTOR) ×2 IMPLANT
WATER STERILE IRR 1000ML POUR (IV SOLUTION) ×3 IMPLANT
WIRE BENTSON .035X145CM (WIRE) ×2 IMPLANT
WIRE J 3MM .035X145CM (WIRE) ×3 IMPLANT

## 2018-02-27 NOTE — Progress Notes (Signed)
Pt returned to OR for ACDF- pt seen by Dr Michelle Piper prior to return to OR. Pt's wife at bedside. Questions answered. - no changes in assessment. Pt's wife placed in room 16 to await post op follow up. OR aware of pt's wife location

## 2018-02-27 NOTE — Anesthesia Procedure Notes (Signed)
Procedure Name: LMA Insertion Date/Time: 02/27/2018 6:17 PM Performed by: Gwenyth Allegra, CRNA Pre-anesthesia Checklist: Patient identified, Emergency Drugs available, Suction available and Patient being monitored Patient Re-evaluated:Patient Re-evaluated prior to induction Oxygen Delivery Method: Circle System Utilized Preoxygenation: Pre-oxygenation with 100% oxygen Induction Type: IV induction Ventilation: Mask ventilation without difficulty LMA: LMA inserted LMA Size: 5.0 Number of attempts: 1 Airway Equipment and Method: Bite block Placement Confirmation: positive ETCO2 Tube secured with: Tape Dental Injury: Teeth and Oropharynx as per pre-operative assessment

## 2018-02-27 NOTE — Transfer of Care (Signed)
Immediate Anesthesia Transfer of Care Note  Patient: GRANDVILLE GALEY  Procedure(s) Performed: MRI WITH ANESTHESIA (N/A )  Patient Location: PACU  Anesthesia Type:General  Level of Consciousness: awake and alert   Airway & Oxygen Therapy: Patient Spontanous Breathing and Patient connected to nasal cannula oxygen  Post-op Assessment: Report given to RN, Post -op Vital signs reviewed and stable and Patient moving all extremities X 4  Post vital signs: Reviewed and stable  Last Vitals:  Vitals Value Taken Time  BP 132/90 02/27/2018  1:35 PM  Temp 37.6 C 02/27/2018  1:35 PM  Pulse 100 02/27/2018  1:43 PM  Resp 32 02/27/2018  1:43 PM  SpO2 94 % 02/27/2018  1:43 PM  Vitals shown include unvalidated device data.  Last Pain:  Vitals:   02/27/18 1335  TempSrc:   PainSc: 0-No pain         Complications: No apparent anesthesia complications

## 2018-02-27 NOTE — Anesthesia Preprocedure Evaluation (Addendum)
Anesthesia Evaluation  Patient identified by MRN, date of birth, ID band Patient awake    Reviewed: Allergy & Precautions, NPO status , Patient's Chart, lab work & pertinent test results  Airway Mallampati: II  TM Distance: >3 FB Neck ROM: Full    Dental  (+) Poor Dentition, Missing   Pulmonary neg pulmonary ROS,    Pulmonary exam normal breath sounds clear to auscultation       Cardiovascular hypertension, + DVT  Normal cardiovascular exam Rhythm:Regular Rate:Normal  ECG: NSR, rate 93   Neuro/Psych PSYCHIATRIC DISORDERS S/P TBI  Bilateral leg weakness after fall  MRI C -spine 1. Degenerative disc osteophyte with facet hypertrophy at C6-7 with resultant severe spinal stenosis and secondary cord compression. Abnormal cord signal intensity at this level most likely reflects contusion/edema related to recent trauma/fall.     GI/Hepatic (+)     substance abuse  alcohol use,   Endo/Other  negative endocrine ROS  Renal/GU Renal InsufficiencyRenal disease     Musculoskeletal negative musculoskeletal ROS (+)   Abdominal   Peds  Hematology  (+) anemia ,   Anesthesia Other Findings Massive deep vein thrombosis  Reproductive/Obstetrics                           Anesthesia Physical  Anesthesia Plan  ASA: III  Anesthesia Plan: General   Post-op Pain Management:    Induction: Intravenous  PONV Risk Score and Plan: 2 and Ondansetron, Dexamethasone and Treatment may vary due to age or medical condition  Airway Management Planned: LMA  Additional Equipment:   Intra-op Plan:   Post-operative Plan: Extubation in OR  Informed Consent: I have reviewed the patients History and Physical, chart, labs and discussed the procedure including the risks, benefits and alternatives for the proposed anesthesia with the patient or authorized representative who has indicated his/her understanding and  acceptance.   Dental advisory given  Plan Discussed with: CRNA  Anesthesia Plan Comments:        Anesthesia Quick Evaluation

## 2018-02-27 NOTE — Consult Note (Addendum)
Reason for Consult: cervical cord compression Referring Physician: dr. Rolin Barry Wayne Buckley is an 67 y.o. male.   HPI:  67 year old patient presented to the hospital on Monday night after sustained two falls last week. His wife accompanies him and is the historian. She states that he fell last Tuesday down the stairs and his head went through a door. He fell again on Thursday and has had minimal movement in his legs since then. He has a history of a TBI 6 years ago. He also had left lower extremity edema which was fount to be positive for a DVT.   Past Medical History:  Diagnosis Date  . Hypertension   . TBI (traumatic brain injury) ([place]) 2013    Past Surgical History:  Procedure Laterality Date  . CRANIOTOMY  04/28/2012   Procedure: CRANIOTOMY HEMATOMA EVACUATION SUBDURAL;  Surgeon: Faythe Ghee, MD;  Location: [facility];  Service: Neurosurgery;  Laterality: Left;  Left Craniotomy for Subdural Hematoma    No Known Allergies  Social History   Tobacco Use  . Smoking status: Never Smoker  . Smokeless tobacco: Never Used  Substance Use Topics  . Alcohol use: Yes    Comment: daily     Family History  Problem Relation Age of Onset  . Heart disease Father      Review of Systems  Positive ROS: as above  All other systems have been reviewed and were otherwise negative with the exception of those mentioned in the HPI and as above.  Objective: Vital signs in last 24 hours: Temp:  [98 F (36.7 C)-99.7 F (37.6 C)] 98 F (36.7 C) (09/11 1445) Pulse Rate:  [69-109] 87 (09/11 1445) Resp:  [15-19] 19 (09/11 1445) BP: (121-159)/(49-91) 138/89 (09/11 1445) SpO2:  [90 %-96 %] 95 % (09/11 1445) Weight:  [91 kg] 91 kg (09/11 1056)  General Appearance: Alert, cooperative, no distress, appears stated age, history of a TBI Head: Normocephalic, without obvious abnormality, atraumatic Eyes: PERRL, conjunctiva/corneas clear, EOM's intact, fundi benign, both eyes      Neck: Supple,  symmetrical, trachea midline, no adenopathy; thyroid: No enlargement/tenderness/nodules; no carotid bruit or JVD Lungs: respirations unlabored Heart: Regular rate and rhythm Extremities: Extremities normal, atraumatic, no cyanosis, LLE edema Skin: Skin color, texture, turgor normal, no rashes or lesions  NEUROLOGIC:   Mental status: A&O x4, no aphasia, good attention span, Memory and fund of knowledge Motor Exam - Paraplegic, 0/5 strength lower extremities, bilateral upper extremities 4/5 Sensory Exam - grossly normal Reflexes: symmetric, no pathologic reflexes, No Hoffman's, No clonus Coordination -unable to test Gait - not tested Balance - not tested Cranial Nerves: I: smell Not tested  II: visual acuity  OS: na    OD: na  II: visual fields Full to confrontation  II: pupils Equal, round, reactive to light  III,VII: ptosis   III,IV,VI: extraocular muscles  Full ROM  V: mastication   V: facial light touch sensation    V,VII: corneal reflex    VII: facial muscle function - upper    VII: facial muscle function - lower   VIII: hearing   IX: soft palate elevation    IX,X: gag reflex   XI: trapezius strength    XI: sternocleidomastoid strength   XI: neck flexion strength    XII: tongue strength      Data Review Lab Results  Component Value Date   WBC 10.8 (H) 02/26/2018   HGB 12.8 (L) 02/26/2018   HCT 38.6 (L)  02/26/2018   MCV 89.1 02/26/2018   PLT 258 02/26/2018   Lab Results  Component Value Date   NA 133 (L) 02/26/2018   K 3.6 02/26/2018   CL 100 02/26/2018   CO2 21 (L) 02/26/2018   BUN 74 (H) 02/26/2018   CREATININE 2.75 (H) 02/26/2018   GLUCOSE 122 (H) 02/26/2018   Lab Results  Component Value Date   INR 1.16 02/25/2018    Radiology: Ct Head Wo Contrast  Result Date: 02/25/2018 CLINICAL DATA:  Head trauma, minor, GCS greater than 13. Altered level of consciousness, unexplained. Unwitnessed fall 4 days ago. EXAM: CT HEAD WITHOUT CONTRAST TECHNIQUE:  Contiguous axial images were obtained from the base of the skull through the vertex without intravenous contrast. COMPARISON:  CT head without contrast,/15/13. FINDINGS: Brain: Chronic encephalomalacia involving the anterior left frontal lobe and temporal tip is noted. This corresponds with previous injury. Study is moderately degraded by patient motion. Basal ganglia are otherwise intact. Insular ribbon is normal bilaterally apart from the anterior encephalomalacia on the left. Study is nondiagnostic in the posterior fossa. No acute hemorrhage or mass lesion is present. The ventricles are proportionate to the degree of atrophy with ex vacuo dilation involving the left lateral ventricle. Vascular: Atherosclerotic calcifications are present within the cavernous internal carotid arteries. There is no hyperdense vessel. Skull: No definite fractures are present. A remote nondisplaced right occipital skull fracture is present. No new fractures are present. Sinuses/Orbits: The visualized paranasal sinuses and mastoid air cells are clear. No definite orbit trauma is present. IMPRESSION: 1. Although the study is severely degraded by patient motion, no acute trauma is evident. 2. Expected chronic encephalomalacia involving the anterior inferior left frontal lobe and left temporal tip. 3. Ex vacuo dilation of the left lateral ventricle. 4. Remote left frontal craniotomy. 5. Remote right occipital skull fracture, stable. Electronically Signed   By: San Morelle M.D.   On: 02/25/2018 16:48   Ct Cervical Spine Wo Contrast  Result Date: 02/25/2018 CLINICAL DATA:  Fall 4 days ago. Unable to ambulate since fall. Cervical spine trauma, high clinical risk. EXAM: CT CERVICAL SPINE WITHOUT CONTRAST TECHNIQUE: Multidetector CT imaging of the cervical spine was performed without intravenous contrast. Multiplanar CT image reconstructions were also generated. COMPARISON:  CT of the cervical spine 04/28/2012 FINDINGS: Alignment:  Slight anterolisthesis at C2-3 is stable. AP alignment is otherwise anatomic. There straightening of the normal cervical lordosis. Skull base and vertebrae: The study is mildly degraded by patient motion. This obscures detail at the craniocervical junction. There is also some obscuration of detail in the lower cervical spine. No acute fracture present Soft tissues and spinal canal: Soft tissues the neck demonstrate atherosclerotic changes at the carotid bifurcations without significant stenosis. Thyroid is normal. No significant adenopathy is present. No soft tissue trauma is present. Disc levels: Left greater than right foraminal narrowing is again seen at C2-3, C3-4, C4-5, and C5-6 due to asymmetric uncovertebral and facet disease. Moderate to severe bilateral foraminal stenosis is present at C6-7. There is mild foraminal narrowing bilaterally at C7-T1. There is progressive stenosis since the prior study. Upper chest: The lung apices are clear. IMPRESSION: 1. No acute traumatic injury to the cervical spine. 2. Study is mildly degraded by patient motion. 3. Slight progression of multilevel moderate to severe spondylosis as described. Electronically Signed   By: San Morelle M.D.   On: 02/25/2018 16:56   Ct Thoracic Spine Wo Contrast  Result Date: 02/25/2018 CLINICAL DATA:  Fall.  Thoracic  spine fracture. EXAM: CT THORACIC SPINE WITHOUT CONTRAST TECHNIQUE: Multidetector CT images of the thoracic were obtained using the standard protocol without intravenous contrast. COMPARISON:  None. FINDINGS: Alignment: Normal alignment Image quality degraded by motion. Vertebrae: Negative for thoracic fracture Paraspinal and other soft tissues: No paraspinous mass or soft tissue edema. Visualized lungs are clear. Calcified lymph nodes in the mediastinum and left hilum. Coronary artery calcification. Disc levels: Prominent anterior and right lateral spurring from T4 through T12. Mild disc space narrowing in the thoracic  spine. Small calcified central disc protrusion at T8-9 with mild facet degeneration. IMPRESSION: Image quality degraded by motion Negative for thoracic fracture Electronically Signed   By: Franchot Gallo M.D.   On: 02/25/2018 16:40   Ct Lumbar Spine Wo Contrast  Result Date: 02/25/2018 CLINICAL DATA:  The patient has been unable to ambulate since a fall 4 days ago at home. EXAM: CT LUMBAR SPINE WITHOUT CONTRAST TECHNIQUE: Multidetector CT imaging of the lumbar spine was performed without intravenous contrast administration. Multiplanar CT image reconstructions were also generated. COMPARISON:  None. FINDINGS: Segmentation: 5 lumbar type vertebrae. Alignment: Normal. Vertebrae: No fractures. Diffuse severe degenerative disc disease in the lumbar spine with relative sparing at L5-S1. Paraspinal and other soft tissues: There is slight dilatation of both ureters with marked distention of the bladder. Slight soft tissue stranding around the left ureter at the L5-S1 level with a small amount of fluid in the left pericolic gutter at that level. This is best seen on images 107-119 of series 5. Aortic atherosclerosis. Disc levels: T11-12: Slight disc space narrowing. Slight left facet arthritis. T12-L1: Disc space narrowing. Small calcified disc protrusion to the left of midline. L1-2: Marked disc space narrowing. Small broad-based disc bulge without neural impingement. L2-3: Marked disc space narrowing. Small broad-based disc bulge with accompanying osteophytes extending into the right neural foramen without focal neural impingement. Slight narrowing of the spinal canal. L3-4: Marked disc space narrowing. Broad-based endplate osteophytes slightly compress the thecal sac. No focal neural impingement. Moderate bilateral facet arthritis. L4-5: Marked disc space narrowing with degenerative changes of the vertebral endplates. Broad-based endplate osteophytes with moderate bilateral foraminal stenosis, left greater than right  with moderate bilateral facet arthritis. There is hypertrophy of the ligamentum flavum and facet joints which combine to create moderately severe spinal stenosis best seen on image 91 of series 4. L5-S1: Small broad-based disc bulge without neural impingement. Moderate bilateral facet arthritis, right greater than left. Moderate right foraminal stenosis. IMPRESSION: 1. No acute abnormality of the lumbar spine. 2. Moderately severe spinal stenosis at L4-5. 3. Diffuse degenerative disc and joint disease in the lumbar spine as described. 4. Bilateral distended ureters and bladder. This may be due to bladder outlet obstruction or neurogenic bladder. Electronically Signed   By: Lorriane Shire M.D.   On: 02/25/2018 16:43   US Renal  Result Date: 02/26/2018 CLINICAL DATA:  67 year old male with a history of acute kidney injury EXAM: RENAL / URINARY TRACT ULTRASOUND COMPLETE COMPARISON:  None. FINDINGS: Right Kidney: Length: 16.2 cm. Mild-moderate right-sided hydronephrosis. Moderate right-sided cortical thinning. Echogenicity is similar to the adjacent liver parenchyma. Flow confirmed in the hilum of the right kidney. Left Kidney: Length: 14.0 cm. Mild to moderate left-sided hydronephrosis. Echogenicity of the left renal cortex similar to the right. Moderate cortical thinning. Anechoic mass on the lateral cortex of the left kidney measures 3.8 cm x 5.4 cm x 4.3 cm with through transmission. No internal flow. No complexity. Flow confirmed in the  hilum of the left kidney. Bladder: Urinary bladder distention. IMPRESSION: Bilateral mild to moderate hydronephrosis. Given the degree of bladder distention, uncertain if this represents bladder outlet obstruction and secondary bilateral hydronephrosis, or pathology of the bilateral distal ureters. Further evaluation with CT imaging recommended. Bilateral moderate renal cortical thinning indicating element of chronic kidney disease. Left-sided Bosniak 1 cyst. Electronically  Signed   By: Corrie Mckusick D.O.   On: 02/26/2018 10:24   Dg Chest Port 1 View  Result Date: 02/25/2018 CLINICAL DATA:  Leukocytosis, traumatic brain injury, unable to ambulate EXAM: PORTABLE CHEST 1 VIEW COMPARISON:  05/02/2012 FINDINGS: Minimal opacity in the right infrahilar lung. No pleural effusion. Normal heart size. No pneumothorax. Aortic atherosclerosis. IMPRESSION: Vague right infrahilar opacity, atelectasis versus minimal infiltrate. Electronically Signed   By: Donavan Foil M.D.   On: 02/25/2018 20:33   Dg Hip Unilat W Or Wo Pelvis 2-3 Views Left  Result Date: 02/25/2018 CLINICAL DATA:  Fall, left leg pain. EXAM: DG HIP (WITH OR WITHOUT PELVIS) 2-3V LEFT COMPARISON:  None. FINDINGS: Hernia mesh markers noted along the lower pelvis. Lower lumbar spondylosis and degenerative disc disease. Spurring of both femoral heads. The patient's hips are both internally rotated on the pelvis and frontal projections, which can reduce sensitivity detecting fracture. No bony pelvic fracture is observed. There is spurring of the left femoral head causing a sclerotic edge extending across the femoral neck bilaterally. I do not see discrete cortical discontinuity to suggest a fracture. Vascular calcification noted. IMPRESSION: 1. No fracture identified. Mildly reduced sensitivity due to internal rotation of the hip on the frontal projections. If the patient is not able to bear weight then other complementary imaging (preferably MRI, which has a greater sensitivity than CT) should be considered. 2. Atherosclerosis. 3. Lower lumbar spondylosis and degenerative disc disease. 4. Degenerative spurring of both femoral heads. Electronically Signed   By: Van Clines M.D.   On: 02/25/2018 15:44     Assessment/Plan: 67 year old presented to the ED after sustained two falls and experiencing lower extremity weakness since last Thursday. MRI showed a large disc herniation and osteophyte at C6-7 causing severe spinal  stenosis and cord compression. We discussed at length the risks and benefits associated with an ACDF C6-7. Right now I think it is imperative that he have an IVC filter placed for the DVT before operating. Plan was discussed with both the patient and the wife. Both understand and agree to move forward. While I am not sure that much of his strength will return after this surgery since he has been this way since Thursday, I do think it is imperative that we try to get this done sooner than later. Will start him on decadron 78m Q6 for 24 hours and then 452mQ6 tomorrow.   KiOcie CornfieldeHillsdale Community Health Center/04/2018 3:44 PM  Agree with above... Patient has 0-1 lower extremity movement does have some light touch but significantly decreased sensation both lower extremity's.  He had a little bit of abductor function.  Upper extremity strength shows hand intrinsics weak at 2-3 out of 5 and triceps 2 out of 5 patient has extensive amount of choreiform movements very difficult to examine.  I have met with the patient talk with the patient and his wife extensively went over the risks and benefits of the aforementioned operation of an anterior cervical discectomy and fusion at C6-7.  I explained to him that there was no guarantee of return of function with surgery however there was  little to no chance of return to function without surgery.  I also went over the fact that it been there for 6 days and more than likely most of the deficit was permanent.  Also explained this was the best possible chance at recovery and I went over the risks and benefits as well as perioperative course expectations of outcome and alternatives and he and his wife agreed to proceed forward.

## 2018-02-27 NOTE — Anesthesia Postprocedure Evaluation (Signed)
Anesthesia Post Note  Patient: Wayne Buckley  Procedure(s) Performed: INSERTION VENA-CAVA FILTER (Right Groin)     Patient location during evaluation: PACU Anesthesia Type: General Level of consciousness: awake and alert Pain management: pain level controlled Vital Signs Assessment: post-procedure vital signs reviewed and stable Respiratory status: spontaneous breathing, nonlabored ventilation, respiratory function stable and patient connected to nasal cannula oxygen Cardiovascular status: blood pressure returned to baseline and stable Postop Assessment: no apparent nausea or vomiting Anesthetic complications: no    Last Vitals:  Vitals:   02/27/18 2051 02/27/18 2121  BP: 119/69 121/88  Pulse: 72 76  Resp: 17 16  Temp:    SpO2: 94% 100%    Last Pain:  Vitals:   02/27/18 2121  TempSrc:   PainSc: 0-No pain                 Lismary Kiehn DAVID

## 2018-02-27 NOTE — Interval H&P Note (Signed)
History and Physical Interval Note:  02/27/2018 5:36 PM  Wayne Buckley  has presented today for surgery, with the diagnosis of massive deep vein thrombosis  The various methods of treatment have been discussed with the patient and family. After consideration of risks, benefits and other options for treatment, the patient has consented to  Procedure(s): INSERTION VENA-CAVA FILTER (N/A) as a surgical intervention .  The patient's history has been reviewed, patient examined, no change in status, stable for surgery.  I have reviewed the patient's chart and labs.  Questions were answered to the patient's satisfaction.     Durene Cal

## 2018-02-27 NOTE — Progress Notes (Signed)
PROGRESS NOTE  Wayne Buckley:[id number] DOB: August 05, 1950 DOA: 02/25/2018 PCP: [name], MD  Brief Narrative: 67 year old man PMH traumatic subdural hemorrhage, status post evacuation; alcohol abuse, urinary retention, presented after fall 9/5 with inability to walk thereafter.  Also noted to have left leg swelling.  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 --Unable to obtain MRI yesterday given his choreoathetosis, had to be done earlier today under general anesthesia, it was significant for C6-7 large disc herniation causing severe spinal stenosis and cord compression, neurosurgery has been consulted, recommendation for surgical intervention, they have discussed with patient and wife, and plan is to proceed with surgery, and recommendation for IVC filter before surgery as high risk for full anticoagulation perioperatively spine fracture.  Extensive left lower extremity DVT,  - common femoral femoral, femoral, profunda, popliteal , peroneal, posterior tibial, gastrocnemius, left external iliac. -With neurosurgery, cervical spine fracture, and urgent planned surgery, high risk for full anticoagulation, recommendation is for IVC at this point, this was discussed with wife and patient, they understand risks and benefits, and agreeable to plan.   Acute kidney injury.   Renal ultrasound shows bilateral mild to moderate hydronephrosis, uncertain if this represents bladder outlet obstruction and secondary bilateral hydronephrosis or pathology of bilateral distal ureters.  Chronic kidney disease suspected. -Most likely gastric outlet obstruction in the setting of genic bladder from cord compression.   TBI 2013 s/p traumatic SDH--status post left  frontotemporal parietal craniotomy, evacuation of subdural and left frontal intracerebral hemorrhage with a left frontal lobectomy. --Mentation and writhing movements at baseline per wife.  PMH alcohol abuse --Per wife "occasionally drinks a beer". --monitor   DVT prophylaxis: none  Code Status: Full Family Communication: Const with life multiple times at bedside today Disposition Plan: pending    Huey Bienenstock, MD  [facility] Direct contact: [phone] --Via amion app OR  --[email or web address]; password [email or web address]  7PM-7AM contact night coverage as above 02/27/2018, 5:43 PM  LOS: 1 day   Consultants:  Neurology   VVS  neuroSurgery  Procedures:    Antimicrobials:    Interval history/Subjective: Patient reports he is hungry and wanting to eat  Objective: Vitals:  Vitals:   02/27/18 1400 02/27/18 1445  BP: (!) 121/49 138/89  Pulse:  87  Resp:  19  Temp: 98.7 F (37.1 C) 98 F (36.7 C)  SpO2:  95%    Exam:  Awake Alert, communicative, but appears to be with impaired judgment and insight  symmetrical Chest wall movement, Good air movement bilaterally, CTAB RRR,No Gallops,Rubs or new Murmurs, No Parasternal Heave +ve B.Sounds, Abd Soft, No tenderness, No rebound - guarding or rigidity. Significant lower extremity weakness    Scheduled Meds: . [MAR Hold] dexamethasone  4 mg Intravenous Q6H  . [MAR Hold] dexamethasone  6 mg Intravenous Q6H  . [MAR Hold] feeding supplement (ENSURE ENLIVE)  237 mL Oral BID BM  . [MAR Hold] folic acid  1 mg Oral Daily  . [MAR Hold] LORazepam  1 mg Intravenous Once  . [MAR Hold] multivitamin with minerals  1 tablet Oral Daily  . [MAR Hold] pantoprazole  40 mg Oral Q1200  . [MAR Hold] pneumococcal 23 valent vaccine  0.5 mL Intramuscular Tomorrow-1000  . [MAR Hold] thiamine  250 mg Oral Daily  . [  MAR Hold] vitamin C  1,000 mg Oral BID   Continuous Infusions: . sodium chloride 125 mL/hr at 02/27/18 0953  . sodium  chloride 10 mL/hr at 02/27/18 1059  . sodium chloride 10 mL/hr at 02/27/18 1710  . [MAR Hold] famotidine (PEPCID) IV      Principal Problem:   Bilateral leg weakness Active Problems:   Subdural hematoma, post-traumatic ([facility])   Urinary retention   Fall   AKI (acute kidney injury) (HCC)   Leg DVT (deep venous thromboembolism), acute, left ([facility]CC)   LOS: 1 day

## 2018-02-27 NOTE — Op Note (Signed)
    Patient name: Wayne Buckley MRN: [id number] DOB: August 24, 1950 Sex: male  02/27/2018 Pre-operative Diagnosis: DVT, left leg Post-operative diagnosis:  Same Surgeon:  Durene Cal Assistants:  none Procedure:   #1: Ultrasound-guided access, right common femoral vein   #2: Intravascular ultrasound (right external iliac, right common iliac vein, and inferior vena cava)   #3: Inferior venacavogram   #4: Placement of inferior vena cava filter (Cook Celect) Anesthesia: General Blood Loss: None Specimens: None  Findings: A total of 10 cc of contrast was administered.  Intravascular ultrasound identified thrombus within the inferior vena cava extending up to the renal veins.  Indications: The patient was admitted to the hospital with left leg swelling and inability to move his legs.  He was found to have an extensive left leg DVT.  MRI shows cord compression and he is scheduled for neurosurgery later tonight.  He cannot be anticoagulated because of the need for neurosurgery.  IVC filter is indicated.  Procedure:  The patient was identified in the holding area and taken to [facility] OR ROOM 16  The patient was then placed supine on the table. general anesthesia was administered.  The patient was prepped and draped in the usual sterile fashion.  A time out was called and antibiotics were administered.  Ultrasound was used to evaluate the right common femoral vein which was free of thrombus and widely patent and easily compressible.  A digital image was acquired.  A skin nick was made with an 11 blade.  The right common femoral vein was cannulated under ultrasound guidance with an 18-gauge needle.  An 035 wire was advanced without resistance.  An 8 French sheath was then placed.  A intravascular ultrasound catheter was used to evaluate the right external iliac and common iliac veins which were free of thrombus.  There was acute thrombus identified in the vena cava up to the level of the renal veins.  I wanted to  confirm the location of the renal veins as it was somewhat difficult to decipher with intravascular ultrasound and so I performed a inferior venacavogram.  This showed a patent vena cava with minimal filling defects.  The location of the renal vein was consistent with what I thought to be the renal veins are intravascular ultrasound.  Next, the 8 French sheath was removed over a Bentson wire, and the filter introducing sheath was inserted.  A Cook Celect filter was then inserted and deployed with the top of the filter at the inferior L1 vertebral body.  It deployed in a straight fashion with minimal tilt.  The sheath was then removed and manual pressure was held for hemostasis.  Dermabond was placed on the incision.  Patient was then extubated and taken to recovery room in stable condition.  There were no immediate complications.   Disposition: To PACU stable.   Juleen China, M.D. [facility] Office: [phone] Pager:  [phone]

## 2018-02-27 NOTE — Anesthesia Preprocedure Evaluation (Signed)
Anesthesia Evaluation  Patient identified by MRN, date of birth, ID band Patient awake    Reviewed: Allergy & Precautions, NPO status , Patient's Chart, lab work & pertinent test results  Airway Mallampati: II  TM Distance: >3 FB Neck ROM: Full    Dental no notable dental hx.    Pulmonary neg pulmonary ROS,    Pulmonary exam normal breath sounds clear to auscultation       Cardiovascular hypertension, Normal cardiovascular exam Rhythm:Regular Rate:Normal     Neuro/Psych S/P TBI  Bilateral leg weakness negative psych ROS   GI/Hepatic negative GI ROS, (+)     substance abuse  alcohol use,   Endo/Other  negative endocrine ROS  Renal/GU Renal InsufficiencyRenal disease  negative genitourinary   Musculoskeletal negative musculoskeletal ROS (+)   Abdominal   Peds negative pediatric ROS (+)  Hematology negative hematology ROS (+)   Anesthesia Other Findings   Reproductive/Obstetrics negative OB ROS                             Anesthesia Physical Anesthesia Plan  ASA: III  Anesthesia Plan: General   Post-op Pain Management:    Induction: Intravenous  PONV Risk Score and Plan: 2 and Ondansetron, Dexamethasone and Treatment may vary due to age or medical condition  Airway Management Planned: LMA and Oral ETT  Additional Equipment:   Intra-op Plan:   Post-operative Plan: Extubation in OR  Informed Consent: I have reviewed the patients History and Physical, chart, labs and discussed the procedure including the risks, benefits and alternatives for the proposed anesthesia with the patient or authorized representative who has indicated his/her understanding and acceptance.   Dental advisory given  Plan Discussed with: CRNA and Surgeon  Anesthesia Plan Comments:         Anesthesia Quick Evaluation

## 2018-02-27 NOTE — Transfer of Care (Signed)
Immediate Anesthesia Transfer of Care Note  Patient: Wayne Buckley  Procedure(s) Performed: INSERTION VENA-CAVA FILTER (Right Groin)  Patient Location: PACU  Anesthesia Type:General  Level of Consciousness: drowsy  Airway & Oxygen Therapy: Patient Spontanous Breathing and Patient connected to nasal cannula oxygen  Post-op Assessment: Report given to RN  Post vital signs: Reviewed and stable  Last Vitals:  Vitals Value Taken Time  BP 130/74 02/27/2018  7:21 PM  Temp    Pulse 77 02/27/2018  7:21 PM  Resp 14 02/27/2018  7:21 PM  SpO2 93 % 02/27/2018  7:21 PM  Vitals shown include unvalidated device data.  Last Pain:  Vitals:   02/27/18 1445  TempSrc: Oral  PainSc:          Complications: No apparent anesthesia complications

## 2018-02-28 ENCOUNTER — Inpatient Hospital Stay (HOSPITAL_COMMUNITY): Payer: Medicare Other

## 2018-02-28 ENCOUNTER — Encounter (HOSPITAL_COMMUNITY): Payer: Self-pay | Admitting: Radiology

## 2018-02-28 LAB — URINALYSIS, ROUTINE W REFLEX MICROSCOPIC
Bacteria, UA: NONE SEEN
Bilirubin Urine: NEGATIVE
Glucose, UA: NEGATIVE mg/dL
Hgb urine dipstick: NEGATIVE
Ketones, ur: NEGATIVE mg/dL
Nitrite: NEGATIVE
PH: 5 (ref 5.0–8.0)
Protein, ur: NEGATIVE mg/dL
SPECIFIC GRAVITY, URINE: 1.011 (ref 1.005–1.030)

## 2018-02-28 LAB — MRSA PCR SCREENING: MRSA by PCR: NEGATIVE

## 2018-02-28 LAB — SODIUM, URINE, RANDOM: Sodium, Ur: 71 mmol/L

## 2018-02-28 LAB — GLUCOSE, CAPILLARY: Glucose-Capillary: 137 mg/dL — ABNORMAL HIGH (ref 70–99)

## 2018-02-28 MED ORDER — LIDOCAINE HCL (CARDIAC) PF 100 MG/5ML IV SOSY
PREFILLED_SYRINGE | INTRAVENOUS | Status: DC | PRN
  Administered 2018-02-27: 100 mg via INTRAVENOUS

## 2018-02-28 MED ORDER — CISATRACURIUM BESYLATE (PF) 10 MG/5ML IV SOLN
INTRAVENOUS | Status: DC | PRN
  Administered 2018-02-27: 8 mg via INTRAVENOUS

## 2018-02-28 MED ORDER — CEFAZOLIN SODIUM-DEXTROSE 2-3 GM-%(50ML) IV SOLR
INTRAVENOUS | Status: DC | PRN
  Administered 2018-02-28: 2 g via INTRAVENOUS

## 2018-02-28 MED ORDER — DEXAMETHASONE SODIUM PHOSPHATE 10 MG/ML IJ SOLN
INTRAMUSCULAR | Status: DC | PRN
  Administered 2018-02-28: 10 mg via INTRAVENOUS

## 2018-02-28 MED ORDER — FENTANYL CITRATE (PF) 100 MCG/2ML IJ SOLN
INTRAMUSCULAR | Status: DC | PRN
  Administered 2018-02-27: 50 ug via INTRAVENOUS
  Administered 2018-02-27: 100 ug via INTRAVENOUS
  Administered 2018-02-28 (×2): 50 ug via INTRAVENOUS

## 2018-02-28 MED ORDER — FENTANYL CITRATE (PF) 100 MCG/2ML IJ SOLN: 25.0000 ug | INTRAMUSCULAR | Status: DC | PRN

## 2018-02-28 MED ORDER — THROMBIN 5000 UNITS EX SOLR
CUTANEOUS | Status: AC
  Filled 2018-02-28: qty 5000

## 2018-02-28 MED ORDER — LACTATED RINGERS IV SOLN
INTRAVENOUS | Status: DC | PRN
  Administered 2018-02-27 – 2018-02-28 (×2): via INTRAVENOUS

## 2018-02-28 MED ORDER — PROPOFOL 10 MG/ML IV BOLUS
INTRAVENOUS | Status: DC | PRN
  Administered 2018-02-27: 170 mg via INTRAVENOUS

## 2018-02-28 MED ORDER — LORAZEPAM 2 MG/ML IJ SOLN
1.0000 mg | Freq: Once | INTRAMUSCULAR | Status: AC
  Administered 2018-02-28: 1 mg via INTRAVENOUS

## 2018-02-28 MED ORDER — PROMETHAZINE HCL 25 MG/ML IJ SOLN: 6.2500 mg | INTRAMUSCULAR | Status: DC | PRN

## 2018-02-28 MED ORDER — DEXTROSE-NACL 5-0.45 % IV SOLN
INTRAVENOUS | Status: DC
  Administered 2018-02-28 – 2018-03-01 (×3): via INTRAVENOUS

## 2018-02-28 MED ORDER — AMLODIPINE BESYLATE 5 MG PO TABS
5.0000 mg | ORAL_TABLET | Freq: Every day | ORAL | Status: DC
  Administered 2018-02-28 – 2018-03-01 (×2): 5 mg via ORAL
  Filled 2018-02-28 (×2): qty 1

## 2018-02-28 MED ORDER — PHENYLEPHRINE HCL 10 MG/ML IJ SOLN
INTRAMUSCULAR | Status: DC | PRN
  Administered 2018-02-28: 40 ug via INTRAVENOUS

## 2018-02-28 MED ORDER — FENTANYL CITRATE (PF) 250 MCG/5ML IJ SOLN
INTRAMUSCULAR | Status: AC
  Filled 2018-02-28: qty 5

## 2018-02-28 MED ORDER — CEFAZOLIN SODIUM 1 G IJ SOLR
INTRAMUSCULAR | Status: AC
  Filled 2018-02-28: qty 60

## 2018-02-28 MED ORDER — THROMBIN 5000 UNITS EX SOLR
CUTANEOUS | Status: DC | PRN
  Administered 2018-02-28 (×2): 5000 [IU] via TOPICAL

## 2018-02-28 MED ORDER — HYDROCHLOROTHIAZIDE 25 MG PO TABS
25.0000 mg | ORAL_TABLET | Freq: Every day | ORAL | Status: DC
  Administered 2018-02-28 – 2018-03-05 (×6): 25 mg via ORAL
  Filled 2018-02-28 (×6): qty 1

## 2018-02-28 MED ORDER — THROMBIN 5000 UNITS EX SOLR
OROMUCOSAL | Status: DC | PRN
  Administered 2018-02-28: 5 mL via TOPICAL

## 2018-02-28 MED ORDER — LABETALOL HCL 5 MG/ML IV SOLN
10.0000 mg | INTRAVENOUS | Status: DC | PRN
  Administered 2018-02-28: 10 mg via INTRAVENOUS
  Filled 2018-02-28 (×2): qty 4

## 2018-02-28 MED ORDER — PROPOFOL 10 MG/ML IV BOLUS
INTRAVENOUS | Status: AC
  Filled 2018-02-28: qty 20

## 2018-02-28 MED ORDER — 0.9 % SODIUM CHLORIDE (POUR BTL) OPTIME
TOPICAL | Status: DC | PRN
  Administered 2018-02-28: 1000 mL

## 2018-02-28 MED ORDER — SODIUM CHLORIDE 0.9 % IV SOLN
INTRAVENOUS | Status: DC | PRN
  Administered 2018-02-28: 500 mL

## 2018-02-28 MED ORDER — HEMOSTATIC AGENTS (NO CHARGE) OPTIME
TOPICAL | Status: DC | PRN
  Administered 2018-02-28: 1 via TOPICAL

## 2018-02-28 MED ORDER — ARTIFICIAL TEARS OPHTHALMIC OINT
TOPICAL_OINTMENT | OPHTHALMIC | Status: DC | PRN
  Administered 2018-02-27: 1 via OPHTHALMIC

## 2018-02-28 MED FILL — Gelatin Absorbable MT Powder: OROMUCOSAL | Qty: 1 | Status: AC

## 2018-02-28 MED FILL — Thrombin For Soln 5000 Unit: CUTANEOUS | Qty: 5000 | Status: AC

## 2018-02-28 NOTE — Addendum Note (Signed)
Addendum  created 02/28/18 0724 by Epifanio LeschesMercer, Jameir Ake L, CRNA   Child order released for a procedure order, Intraprocedure Blocks edited, Intraprocedure Event deleted, Intraprocedure Event edited, LDA created via procedure documentation, Sign clinical note

## 2018-02-28 NOTE — Anesthesia Procedure Notes (Signed)
Procedure Name: Intubation Date/Time: 02/28/2018 12:10 AM Performed by: Claudina LickMahony, Amaryllis Malmquist D, CRNA Pre-anesthesia Checklist: Patient identified, Emergency Drugs available, Suction available, Patient being monitored and Timeout performed Patient Re-evaluated:Patient Re-evaluated prior to induction Oxygen Delivery Method: Circle system utilized Preoxygenation: Pre-oxygenation with 100% oxygen Induction Type: IV induction Ventilation: Mask ventilation without difficulty Laryngoscope Size: Miller and 2 Grade View: Grade I Tube type: Subglottic suction tube Tube size: 7.5 mm Number of attempts: 1 Airway Equipment and Method: Stylet Placement Confirmation: ETT inserted through vocal cords under direct vision,  positive ETCO2 and breath sounds checked- equal and bilateral Secured at: 23 cm Tube secured with: Tape Dental Injury: Teeth and Oropharynx as per pre-operative assessment

## 2018-02-28 NOTE — Consult Note (Signed)
PULMONARY / CRITICAL CARE MEDICINE   NAME:  Wayne Buckley, MRN:  [id number], DOB:  11/10/1950, LOS: 2 ADMISSION DATE:  02/25/2018, [place] DATE:  9/12 REFERRING MD:  Dr. 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 He also has a history of alcohol abuse and supposedly still drinks per wife. Since his prior injury, his mental status has never returned to baseline. [facility] with complains of recent falls, most recently 3-4 days prior to presentation. Since that fall he had been bed bound and not acting himself, which prompted family to bring him to the ED. He was also not able to move his legs much. He was found to have a L leg DVT. MRI brian noted large disk herniation at C6-7. IVC filter was placed for DVT, and he was taken to OR for ACDF of C6-7 on 9/11. Post operatively he was extubated, but was agitated and was sent to ICU for recovery. PCCM asked to see.   SIGNIFICANT EVENTS:  9/9 admit 9/11 to OR for ACDF  STUDIES:   US renal 9/10 > Bilateral mild to moderate hydronephrosis. uncertain if this represents bladder outlet obstruction and secondary bilateral hydronephrosis, or pathology of the bilateral distal ureters. MR cervical spine 9/11 > Degenerative disc osteophyte with facet hypertrophy at C6-7 with resultant severe spinal stenosis and secondary cord compression. Abnormal cord signal intensity at this level most likely reflects contusion/edema related to recent trauma/fall. Neurosurgical consultation recommended. Soft tissue edema adjacent to the right C6-7 facet as well as the C5 through C7 spinous processes, likely posttraumatic in nature given the adjacent cord injury. No definite discrete fracture line identified. DVT study 9/9 > Findings consistent with acute deep vein thrombosis involving the left common femoral vein, left  femoral vein, left proximal profunda vein, left popliteal vein, left posterior tibial vein, left peroneal vein, and left gastrocnemius vein.  CULTURES:    ANTIBIOTICS:    LINES/TUBES:     CONSULTANTS:  Neurology Neurosurgery Vascular Surgery  SUBJECTIVE:  No complaints  CONSTITUTIONAL: BP (!) (P) 164/83 (BP Location: Right Arm)   Pulse (P) 78   Temp (P) 98.1 F (36.7 C)   Resp (P) 16   Ht 5' 10.98" (1.803 m)   Wt 91 kg   SpO2 (P) 96%   BMI 27.99 kg/m   I/O last 3 completed shifts: In: 3145.3 [I.V.:3145.3] Out: 4100 [Urine:4100]  PHYSICAL EXAM: General:  Adult male of normal body habitus in NAD Neuro:  Spontaneously awake, alert. Thinks he is on a boat.  HEENT:  [place]/AT, PERRL, no JVD. Surgical dressing to anterior neck.  Cardiovascular:  RRR, no MRG Lungs:  Clear Abdomen:  Soft, non-tender, non-distended Musculoskeletal:  No acute deformity, unable to move legs.  Skin:  Grossly intact.   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 IVC filter placed 9/11 [facility]. - Holding anticoagulation. Will await neurosurgery recs on when this would be OK to start.   Acute kidney injury with bilateral hydronephrosis. 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 Foley has been placed and he is making urine.  - Continue foley.  - Strict intake and output.  - May need repeat imaging of  kidneys.   History of Traumatic brain injury SDH.: Alteration of mentation at baseline now.  - Supportive care  Hx Hypertension - on no home meds. Monitor  Alcohol abuse "still occasionally drinks a beer" - Thiamine - Folate - Dextrose  SUMMARY OF TODAY'S PLAN:  Transfer to ICU for monitoring. Can likely be transferred back to floor/TRH once he wakes up a bit.   Best Practice / Goals of Care / Disposition.   DVT  PROPHYLAXIS:None (DVT and surgery) SUP: Protonix NUTRITION:advance as tolerated MOBILITY: Bedrest GOALS OF CARE:Full code FAMILY DISCUSSIONS: no family present DISPOSITION ICU  LABS  Glucose No results for input(s): GLUCAP in the last 168 hours.  BMET Recent Labs  Lab 02/25/18 1413 02/26/18 0407 02/27/18 2041  NA 136 133* 143  K 4.2 3.6 4.6  CL 99 100 110  CO2 22 21* 23  BUN 73* 74* 35*  CREATININE 2.79* 2.75* 1.05  GLUCOSE 111* 122* 139*    Liver Enzymes Recent Labs  Lab 02/25/18 1413  AST 25  ALT 33  ALKPHOS 96  BILITOT 1.4*  ALBUMIN 3.4*    Electrolytes Recent Labs  Lab 02/25/18 1413 02/26/18 0407 02/27/18 2041  CALCIUM 9.0 8.0* 8.3*  MG 3.1*  --   --   PHOS 6.4*  --   --     CBC Recent Labs  Lab 02/25/18 1413 02/26/18 0407 02/27/18 2041  WBC 14.4* 10.8* 12.5*  HGB 14.7 12.8* 12.1*  HCT 44.1 38.6* 37.5*  PLT 291 258 320    ABG No results for input(s): PHART, PCO2ART, PO2ART in the last 168 hours.  Coag's Recent Labs  Lab 02/25/18 1413  INR 1.16    Sepsis Markers No results for input(s): LATICACIDVEN, PROCALCITON, O2SATVEN in the last 168 hours.  Cardiac Enzymes No results for input(s): TROPONINI, PROBNP in the last 168 hours.  PAST MEDICAL HISTORY :   He  has a past medical history of Hypertension and TBI (traumatic brain injury) (HCC) (2013).  PAST SURGICAL HISTORY:  He  has a past surgical history that includes Craniotomy (04/28/2012).  No Known Allergies  No current facility-administered medications on file prior to encounter.    Current Outpatient Medications on File Prior to Encounter  Medication Sig  . Ascorbic Acid (VITAMIN C) 1000 MG tablet Take 1,000 mg by mouth daily.   Marland Kitchen aspirin EC 325 MG tablet Take 325 mg by mouth 2 (two) times daily.  . Multiple Vitamin (MULTIVITAMIN WITH MINERALS) TABS Take 1 tablet by mouth daily.  . feeding supplement (ENSURE COMPLETE) LIQD Take 237 mLs by mouth 2 (two) times daily between  meals. (Patient not taking: Reported on 02/26/2018)  . folic acid (FOLVITE) 1 MG tablet Take 1 tablet (1 mg total) by mouth daily. (Patient not taking: Reported on 02/26/2018)  . pantoprazole (PROTONIX) 40 MG tablet Take 1 tablet (40 mg total) by mouth daily at 12 noon. (Patient not taking: Reported on 02/26/2018)  . thiamine 250 MG tablet Take 1 tablet (250 mg total) by mouth daily. (Patient not taking: Reported on 02/26/2018)    FAMILY HISTORY:   His family history includes Heart disease in his father.  SOCIAL HISTORY:  He  reports that he has never smoked. He has never used smokeless tobacco. He reports that he drinks alcohol.  REVIEW OF SYSTEMS:    Unable as patient is encephalopathic.

## 2018-02-28 NOTE — Anesthesia Preprocedure Evaluation (Signed)
Anesthesia Evaluation  Patient identified by MRN, date of birth, ID band Patient awake    Reviewed: Allergy & Precautions, NPO status , Patient's Chart, lab work & pertinent test results  Airway Mallampati: I  TM Distance: >3 FB Neck ROM: Full    Dental   Pulmonary    Pulmonary exam normal        Cardiovascular hypertension, Pt. on medications Normal cardiovascular exam  DVT in LE   Neuro/Psych Quadraparesis    GI/Hepatic   Endo/Other    Renal/GU      Musculoskeletal   Abdominal   Peds  Hematology   Anesthesia Other Findings   Reproductive/Obstetrics                             Anesthesia Physical Anesthesia Plan  ASA: III  Anesthesia Plan: General   Post-op Pain Management:    Induction: Intravenous  PONV Risk Score and Plan: 2 and Ondansetron and Treatment may vary due to age or medical condition  Airway Management Planned: Oral ETT  Additional Equipment:   Intra-op Plan:   Post-operative Plan: Extubation in OR  Informed Consent: I have reviewed the patients History and Physical, chart, labs and discussed the procedure including the risks, benefits and alternatives for the proposed anesthesia with the patient or authorized representative who has indicated his/her understanding and acceptance.     Plan Discussed with: CRNA and Surgeon  Anesthesia Plan Comments:         Anesthesia Quick Evaluation

## 2018-02-28 NOTE — Evaluation (Signed)
Clinical/Bedside Swallow Evaluation Patient Details  Name: Wayne Buckley MRN: [id number] Date of Birth: 1950-09-24  Today's Date: 02/28/2018 Time: SLP Start Time (ACUTE ONLY): 1535 SLP Stop Time (ACUTE ONLY): 1545 SLP Time Calculation (min) (ACUTE ONLY): 10 min  Past Medical History:  Past Medical History:  Diagnosis Date  . Hypertension   . TBI (traumatic brain injury) (HCC) 2013   Past Surgical History:  Past Surgical History:  Procedure Laterality Date  . CRANIOTOMY  04/28/2012   Procedure: CRANIOTOMY HEMATOMA EVACUATION SUBDURAL;  Surgeon: Reinaldo Meeker, MD;  Location: [facility] OR;  Service: Neurosurgery;  Laterality: Left;  Left Craniotomy for Subdural Hematoma  . RADIOLOGY WITH ANESTHESIA N/A 02/27/2018   Procedure: MRI WITH ANESTHESIA;  Surgeon: Radiologist, Medication, MD;  Location: [facility] OR;  Service: Radiology;  Laterality: N/A;  . VENA CAVA FILTER PLACEMENT Right 02/27/2018   Procedure: INSERTION VENA-CAVA FILTER;  Surgeon: Nada Libman, MD;  Location: [facility] OR;  Service: Vascular;  Laterality: Right;   HPI:  67 yr old gentleman with pMHx for TBI, ETOH abuse, and most recently a series of falls.  Pt found to have a new LLE DVT, now s/p IVC filter and POD 0 s/p ACDF for C6-7 disk herniation with cord compression 11/28/17, currently on clear liquids per MD. Tenna Child evaluation ordered. Pt known to SLP service from prior admission for [facility]; required solid texture modifications due to oral dysphagia/pocketing but progressed to dys 3, thin liquids at time of discharge (05/28/12).    Assessment / Plan / Recommendation Clinical Impression   Patient presents with what appears to be a functional oropharyngeal swallow with thin liquids; he passed 3 oz water swallow without difficulty. He is oriented to self and situation; but he is somewhat confused (states location as [place], but [facility]) and impulsive. He appears slightly physically agitated, has left-leaning posture and only  remains upright for brief periods. HR mildly elevated per RN, questioning withdrawal. Recommend he continue clear liquids at this time, will follow up for advancement.    SLP Visit Diagnosis: Dysphagia, unspecified (R13.10)    Aspiration Risk  Mild aspiration risk;Moderate aspiration risk    Diet Recommendation Thin liquid;Other (Comment)(advance solids per MD)   Liquid Administration via: Cup;Straw Medication Administration: Whole meds with liquid Supervision: Intermittent supervision to cue for compensatory strategies Compensations: Slow rate;Small sips/bites;Minimize environmental distractions Postural Changes: Seated upright at 90 degrees    Other  Recommendations Oral Care Recommendations: Oral care BID   Follow up Recommendations Other (comment)(tbd)      Frequency and Duration min 1 x/week  1 week       Prognosis Prognosis for Safe Diet Advancement: Good Barriers to Reach Goals: Cognitive deficits      Swallow Study   General Date of Onset: 02/25/18 HPI: 67 yr old gentleman with pMHx for TBI, ETOH abuse, and most recently a series of falls.  Pt found to have a new LLE DVT, now s/p IVC filter and POD 0 s/p ACDF for C6-7 disk herniation with cord compression 11/28/17, currently on clear liquids per MD. Tenna Child evaluation ordered. Pt known to SLP service from prior admission for [facility]; required solid texture modifications due to oral dysphagia/pocketing but progressed to dys 3, thin liquids at time of discharge (05/28/12).  Type of Study: Bedside Swallow Evaluation Previous Swallow Assessment: see HPI Diet Prior to this Study: Thin liquids(clear liquids) Temperature Spikes Noted: No Respiratory Status: Room air History of Recent Intubation: Yes Length of Intubations (days): (for procedure) Date  extubated: 02/28/18 Behavior/Cognition: Alert;Cooperative;Impulsive Oral Cavity Assessment: Within Functional Limits Oral Care Completed by SLP: No Oral Cavity - Dentition:  Adequate natural dentition Self-Feeding Abilities: Needs assist Patient Positioning: Upright in bed Baseline Vocal Quality: Normal Volitional Cough: Strong Volitional Swallow: Able to elicit    Oral/Motor/Sensory Function Overall Oral Motor/Sensory Function: Within functional limits   Ice Chips Ice chips: Within functional limits   Thin Liquid Thin Liquid: Within functional limits Presentation: Cup;Straw    Nectar Thick Nectar Thick Liquid: Not tested   Honey Thick Honey Thick Liquid: Not tested   Puree Puree: Not tested   Solid     Solid: Not tested     Wayne BatonMary Beth Kristi Norment, MS, CCC-SLP Speech-Language Pathologist Acute Rehabilitation Services Pager: [phone] Office: [phone]  Wayne Buckley 02/28/2018,3:56 PM

## 2018-02-28 NOTE — Transfer of Care (Signed)
Immediate Anesthesia Transfer of Care Note  Patient: Wayne Buckley  Procedure(s) Performed: ANTERIOR CERVICAL DECOMPRESSION/DISCECTOMY FUSION C6-7 (N/A Neck)  Patient Location: PACU  Anesthesia Type:General  Level of Consciousness: awake  Airway & Oxygen Therapy: Patient Spontanous Breathing  Post-op Assessment: Report given to RN and Post -op Vital signs reviewed and stable  Post vital signs: Reviewed and stable  Last Vitals:  Vitals Value Taken Time  BP    Temp    Pulse 91 02/28/2018  7:37 PM  Resp 17 02/28/2018  7:37 PM  SpO2 100 % 02/28/2018  7:37 PM  Vitals shown include unvalidated device data.  Last Pain:  Vitals:   02/28/18 1600  TempSrc:   PainSc: 0-No pain         Complications: No apparent anesthesia complications

## 2018-02-28 NOTE — Anesthesia Procedure Notes (Signed)
Procedure Name: Intubation Date/Time: 02/27/2018 11:34 AM Performed by: Inda Coke, CRNA Pre-anesthesia Checklist: Patient identified, Emergency Drugs available, Suction available and Patient being monitored Patient Re-evaluated:Patient Re-evaluated prior to induction Oxygen Delivery Method: Circle System Utilized Preoxygenation: Pre-oxygenation with 100% oxygen Induction Type: IV induction Ventilation: Mask ventilation without difficulty Laryngoscope Size: Mac and 4 Grade View: Grade I Tube type: Oral Tube size: 7.5 mm Number of attempts: 1 Airway Equipment and Method: Stylet and Oral airway Placement Confirmation: ETT inserted through vocal cords under direct vision,  positive ETCO2 and breath sounds checked- equal and bilateral Secured at: 23 cm Tube secured with: Tape Dental Injury: Teeth and Oropharynx as per pre-operative assessment

## 2018-02-28 NOTE — Anesthesia Postprocedure Evaluation (Signed)
Anesthesia Post Note  Patient: Wayne Buckley  Procedure(s) Performed: ANTERIOR CERVICAL DECOMPRESSION/DISCECTOMY FUSION C6-7 (N/A Neck)     Patient location during evaluation: PACU Anesthesia Type: General Level of consciousness: confused Pain management: pain level controlled Vital Signs Assessment: post-procedure vital signs reviewed and stable Respiratory status: spontaneous breathing, nonlabored ventilation, respiratory function stable and patient connected to nasal cannula oxygen Cardiovascular status: blood pressure returned to baseline and stable Postop Assessment: no apparent nausea or vomiting Anesthetic complications: no    Last Vitals:  Vitals:   02/28/18 0330 02/28/18 0409  BP:  (!) 164/83  Pulse: 78 78  Resp:  16  Temp:  36.7 C  SpO2:      Last Pain:  Vitals:   02/27/18 2323  TempSrc:   PainSc: 0-No pain                 Mane Consolo DAVID

## 2018-02-28 NOTE — Progress Notes (Signed)
[facility]  S: Admitted overnight for post op management after ACDF C6-7; he had an IVC filter in the setting of a DVT.  O: Vitals:   02/28/18 0330 02/28/18 0400 02/28/18 0409 02/28/18 0430  BP: (!) 170/88  (!) 164/83 (!) 183/83  Pulse: 78  78   Resp:   16   Temp: 98 F (36.7 C)  98.1 F (36.7 C)   TempSrc:      SpO2: 99% 100% 100% 100%  Weight:    96.9 kg  Height:       General:  Resting comfortably in bed HENT: NCAT OP clear PULM: CTA B, normal effort CV: RRR, no mgr GI: BS+, soft, nontender MSK: normal bulk and tone Neuro: confused but following commands, thinks he is in a hospital   CBC    Component Value Date/Time   WBC 12.5 (H) 02/27/2018 2041   RBC 4.14 (L) 02/27/2018 2041   HGB 12.1 (L) 02/27/2018 2041   HCT 37.5 (L) 02/27/2018 2041   PLT 320 02/27/2018 2041   MCV 90.6 02/27/2018 2041   MCH 29.2 02/27/2018 2041   MCHC 32.3 02/27/2018 2041   RDW 13.2 02/27/2018 2041   LYMPHSABS 1.1 02/25/2018 1413   MONOABS 1.2 (H) 02/25/2018 1413   EOSABS 0.4 02/25/2018 1413   BASOSABS 0.1 02/25/2018 1413   BMET    Component Value Date/Time   NA 143 02/27/2018 2041   K 4.6 02/27/2018 2041   CL 110 02/27/2018 2041   CO2 23 02/27/2018 2041   GLUCOSE 139 (H) 02/27/2018 2041   BUN 35 (H) 02/27/2018 2041   CREATININE 1.05 02/27/2018 2041   CALCIUM 8.3 (L) 02/27/2018 2041   GFRNONAA >60 02/27/2018 2041   GFRAA >60 02/27/2018 2041   Impression: C6-7 disc herniation and severe spinal stenosis, now s/p ACDF overnight DVT: s/p IVC filter History of SDH/TRH Acute encephalopathy: improving History of alcohol use Hypertension  Plan: Clears for now, SLP evaluation Start amlodipine and HCTZ PT/OT consult Out of bed Stop antiacids Continue thiamine, folate Monitor neuro status, he needs frequent orienation, lights on during daytime, off night Would prefer to decrease decadron if able (confusion)  [facility]  Heber CarolinaBrent McQuaid, MD Early PCCM Pager:  [phone] Cell: [phone] After 3pm or if no response, call [phone]

## 2018-02-28 NOTE — Op Note (Signed)
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  Due the patient's failed conservative treatment progression clinical picture and imaging is I recommended ACDF at C6-7.  I extensively reviewed the risks and benefits of the procedure with him as well as perioperative course expectations of outcome and alternatives of surgery and he understands and agrees to proceed forward.  Operative procedure:  Patient was brought into the OR was induced under general anesthesia position supine neck in slight extension and 5 pounds of halter traction.  The right side of his neck was prepped and draped in routine sterile fashion preoperative x-ray localize the appropriate level so curvilinear incision was made just off the midline to the anterior border of the sternocleidomastoid the superficial layer of potatoes was dissected out divided longitudinally.  The avascular plane between the sternocleidomastoid and strap muscles developed down to the prevertebral fascia   Was dissected away with Kittner is.  Intraoperative x-ray confirmed identification of the disc space above so the C6-7 disc space was confirmed anterior osteophytes were bitten off with a Leksell rongeur and longus coli was reflected laterally.  Self-retaining retractors placed disc space was drilled down calf shin bone  shavings and mucus trap and under microscopic illumination disc space was further drilled down to the posterior annulus and osteophyte complex it was a very large spur coming off the C7 endplate causing severe cord compression in addition there was extensive amount of soft disc material especially off to the left side in the C7 foramen.  Endplates were aggressively drilled down and aggressively under bitten decompressing the central canal and both C7 nerve roots.  At the end decompression was no further stenosis either centrally or foraminally I sized up a 9 millimeter cage packed with local we harvested autograft mixed with DBX makes and inserted.  I then selected a 60 millimeter plate and all [id number] millimeters screws had excellent purchase.  Locking mechanism was engaged and the wound was copiously irrigated and closed with interrupted Vicryl and running for subcuticular Dermabond benzoin Steri-Strips and a sterile dressing applied and patient recovery room stable condition.  At the end the case all needle counts and sponge counts were correct.

## 2018-02-28 NOTE — Progress Notes (Signed)
Down time note regarding care in PACU from 0200 - 0409. Dr Michelle Piperssey called regarding pt's mentation- agitated and confuses to day, time and situation. Pt able to state name & DOB. Pt states, 'I am on a boat & I wan to get off." Two RN's at the bedside to redirect pt from pulling at lines & rubbing eyes. Pulse Ox moved to forehead to avoid corneal abrasions. Dr Michelle Piperssey called at [facility]15 regarding pt's level of agitation. Dr Michelle Piperssey to bedside at 0220 to see pt. Dr Michelle Piperssey requests RN call admitting doctor to transfer  pt to ICU due to general anesthesia procedures today. Call to 5W to determine pt's admitting service at 0222. Page to Internal medicine resident, Dr Prescilla Soursorrel at [phone], return page at 0229 to confirm pt is on hospitalist service. Call to Admissions at 0230 to determine who is covering patient at this time. Page to Dr Antionette Charpyd at [phone], call back from Dr Antionette Charpyd at 0259 to update on pt status and request for pt to go to ICU per Dr Michelle Piperssey. Dr Antionette Charpyd unable to admit to ICU. Per Dr Antionette Charpyd, CCM would have to see pt. Call to Dr Bard HerbertSimmonds at 0310 to update on pt status, C. Mintie Witherington, RN attempted to give contact info for Dr Michelle Piperssey to CCM on call. Per Dr Bard HerbertSimmonds, "Have Dr Michelle Piperssey write the transfer order then the critical care service will take care of the ICU orders" Follow up call to Dr Michelle Piperssey at [phone] regarding need for transfer order. [facility]iperssey- telephone order read back and veriifed. Order faxed to patient placement at 0320. At 0340 Dr Mikey Bussinghoffman at bedside to see pt in PACU.  33M bed assigned at 0345.

## 2018-02-28 NOTE — Progress Notes (Signed)
No change in mentation - oriented to name & DOB- not oriented to time or place. Pt placed on hold for bed placement

## 2018-02-28 NOTE — Progress Notes (Signed)
PT Cancellation Note  Patient Details Name: Wayne Buckley MRN: [id number] DOB: 12/15/1950   Cancelled Treatment:    Reason Eval/Treat Not Completed: (P) Patient at procedure or test/unavailable Pt off floor for ANTERIOR CERVICAL DECOMPRESSION/DISCECTOMY FUSION C6-7. PT will follow back.  [name] B. [name] PT, DPT Acute Rehabilitation Services Pager [phone] Office [phone]    [name] 02/28/2018, 9:45 AM

## 2018-03-01 ENCOUNTER — Encounter (HOSPITAL_COMMUNITY): Payer: Self-pay | Admitting: Neurosurgery

## 2018-03-01 DIAGNOSIS — S14109S Unspecified injury at unspecified level of cervical spinal cord, sequela: Secondary | ICD-10-CM

## 2018-03-01 LAB — BASIC METABOLIC PANEL
ANION GAP: 9 (ref 5–15)
BUN: 21 mg/dL (ref 8–23)
CHLORIDE: 105 mmol/L (ref 98–111)
CO2: 23 mmol/L (ref 22–32)
Calcium: 8.2 mg/dL — ABNORMAL LOW (ref 8.9–10.3)
Creatinine, Ser: 0.76 mg/dL (ref 0.61–1.24)
GFR calc non Af Amer: >60 mL/min (ref >60)
Glucose, Bld: 154 mg/dL — ABNORMAL HIGH (ref 70–99)
POTASSIUM: 3.8 mmol/L (ref 3.5–5.1)
Sodium: 137 mmol/L (ref 135–145)

## 2018-03-01 MED ORDER — AMLODIPINE BESYLATE 5 MG PO TABS
5.0000 mg | ORAL_TABLET | Freq: Once | ORAL | Status: AC
  Administered 2018-03-01: 5 mg via ORAL
  Filled 2018-03-01: qty 1

## 2018-03-01 MED ORDER — AMLODIPINE BESYLATE 10 MG PO TABS
10.0000 mg | ORAL_TABLET | Freq: Every day | ORAL | Status: DC
  Administered 2018-03-02 – 2018-03-05 (×4): 10 mg via ORAL
  Filled 2018-03-01 (×4): qty 1

## 2018-03-01 MED ORDER — BENZONATATE 100 MG PO CAPS
200.0000 mg | ORAL_CAPSULE | Freq: Two times a day (BID) | ORAL | Status: DC | PRN
  Administered 2018-03-01 – 2018-03-02 (×2): 200 mg via ORAL
  Filled 2018-03-01 (×3): qty 2

## 2018-03-01 NOTE — Progress Notes (Signed)
Subjective: Patient reports Patient doing well not complaining of neck pain improved lower extremity function  Objective: Vital signs in last 24 hours: Temp:  [98.3 F (36.8 C)-99.5 F (37.5 C)] 98.3 F (36.8 C) (09/13 1117) Pulse Rate:  [68-111] 103 (09/13 1000) Resp:  [14-31] 16 (09/13 1000) BP: (132-173)/(70-99) 173/94 (09/13 1039) SpO2:  [94 %-100 %] 98 % (09/13 1000)  Intake/Output from previous day: 09/12 0701 - 09/13 0700 In: 2736 [P.O.:635; I.V.:2101] Out: 2575 [Urine:2575] Intake/Output this shift: Total I/O In: 497.7 [I.V.:497.7] Out: 200 [Urine:200]  Awake alert antigravity strength in his lower extremities incision clean dry and intact  Lab Results: Recent Labs    02/27/18 2041  WBC 12.5*  HGB 12.1*  HCT 37.5*  PLT 320   BMET Recent Labs    02/27/18 2041 03/01/18 0240  NA 143 137  K 4.6 3.8  CL 110 105  CO2 23 23  GLUCOSE 139* 154*  BUN 35* 21  CREATININE 1.05 0.76  CALCIUM 8.3* 8.2*    Studies/Results: Dg Cervical Spine 2 Or 3 Views  Result Date: 02/28/2018 CLINICAL DATA:  Cervical decompression and fusion at C6-7 EXAM: DG C-ARM 61-120 MIN; CERVICAL SPINE - 2-3 VIEW COMPARISON:  None. FLUOROSCOPY TIME:  Radiation Exposure Index (as provided by the fluoroscopic device): Not available If the device does not provide the exposure index: Fluoroscopy Time:  18 seconds Number of Acquired Images:  2 FINDINGS: Initial lateral images demonstrate diffuse degenerative changes of the cervical spine. The inferior aspect of the cervical spine is not well visualized due to overlying shoulder artifact. Second image demonstrates evidence of fusion at C6-7 with anterior fixation. IMPRESSION: C6-7 fusion. Electronically Signed   By: Alcide Clever M.D.   On: 02/28/2018 09:52   Mr Cervical Spine Wo Contrast  Result Date: 02/27/2018 CLINICAL DATA:  Initial evaluation for generalized muscle weakness status post recent fall on 02/21/2018. EXAM: MRI CERVICAL, THORACIC AND  LUMBAR SPINE WITHOUT CONTRAST TECHNIQUE: Multiplanar and multiecho pulse sequences of the cervical spine, to include the craniocervical junction and cervicothoracic junction, and thoracic and lumbar spine, were obtained without intravenous contrast. COMPARISON:  Prior CT from 02/25/2018 FINDINGS: MRI CERVICAL SPINE FINDINGS Alignment: Straightening with smooth reversal of the normal cervical lordosis. Trace anterolisthesis of C2 on C3, stable. Vertebrae: Vertebral body heights maintained without evidence for acute or chronic fracture. Heterogeneous bone marrow signal intensity seen at C3-4 through T1-2 due to underlying degenerative spondylolysis. No discrete or worrisome osseous lesions. Cord: Patchy abnormal T2 signal abnormality seen within the cervical spinal cord at the level of C6-7, concerning for possible cord contusion/edema given provided history. No associated hemorrhage. Signal intensity within the cervical spinal cord otherwise normal. Posterior Fossa, vertebral arteries, paraspinal tissues: Visualized brain demonstrates no acute abnormality. Retrocerebellar cystic space could reflect mega cisterna magna versus arachnoid cyst. Craniocervical junction normal. Soft tissue edema adjacent to the right C6-7 facet, suspected to be posttraumatic in nature given adjacent cord injury (series 5, image 2). No definite discrete fracture line about the adjacent facet. Additionally, mild soft tissue edema within the posterior soft tissues adjacent to the spinous processes of C5 through C7, also likely posttraumatic (series 5, image 8). Patient is intubated with fluid in the nasopharynx. Abnormal flow void within the left vertebral artery, likely occluded. Finding is age indeterminate. Normal flow void within the dominant right vertebral artery. Disc levels: C2-C3: Trace anterolisthesis. Shallow central disc protrusion mildly indents the ventral thecal sac. Prominent left-sided facet degeneration. Resultant moderate  left  C3 foraminal stenosis. No significant canal narrowing. Right neural foraminal on the patent. C3-C4: Diffuse degenerative disc osteophyte with intervertebral disc space narrowing. Broad posterior component, asymmetric to the left flattens and partially faces the ventral thecal sac with resultant mild spinal stenosis. No significant cord deformity. Superimposed bilateral facet hypertrophy. Resultant severe left with moderate right C4 foraminal narrowing. C4-C5: Diffuse degenerative disc osteophyte with intervertebral disc space narrowing with bilateral facet hypertrophy. Flattening of the ventral thecal sac with resultant mild spinal stenosis. Severe bilateral C5 foraminal narrowing. C5-C6: Diffuse disc osteophyte with intervertebral disc space narrowing. Broad posterior component indents and partially faces the ventral CSF. Resultant mild to moderate spinal stenosis without cord deformity. Bilateral facet hypertrophy. Resultant moderate bilateral C6 foraminal narrowing. C6-C7: Diffuse degenerative disc osteophyte with intervertebral disc space narrowing and bilateral uncovertebral hypertrophy. Broad posterior disc osteophyte complex (mostly disc) effaces the ventral thecal sac, impinging upon the cervical spinal cord. Superimposed facet and ligament flavum hypertrophy. Resultant severe spinal stenosis with cord flattening. Thecal sac measures approximately 4 mm in AP diameter at its most narrow point. Associated cord signal abnormality, likely contusion/edema. Severe bilateral C7 foraminal stenosis. C7-T1: Diffuse degenerative disc osteophyte with intervertebral disc space narrowing and left greater than right uncovertebral spurring. No significant spinal stenosis. Severe bilateral C8 foraminal stenosis. MRI THORACIC SPINE FINDINGS Alignment: Vertebral bodies normally aligned with preservation of the normal thoracic kyphosis. No listhesis. Vertebrae: Vertebral body heights maintained without evidence for acute  or chronic fracture. Bone marrow signal intensity within normal limits. Benign hemangioma noted within the T8 vertebral body. No other discrete or worrisome osseous lesions. No abnormal marrow edema. Cord: Signal intensity within the thoracic spinal cord is normal. No evidence for traumatic cord injury. Paraspinal and other soft tissues: Paraspinous soft tissues demonstrate no acute finding. Patchy opacity within the partially visualized posterior right lower lobe, which could reflect atelectasis or infiltrate. Partially visualized visceral structures otherwise grossly unremarkable. Disc levels: T1-2: Mild diffuse disc bulge with bilateral facet hypertrophy. No significant spinal stenosis. Mild to moderate bilateral foraminal narrowing. T7-8: Tiny central disc protrusion mildly indents the ventral thecal sac. No significant stenosis. T8-9: Central disc protrusion indents the ventral thecal sac. Minimal flattening of the ventral cord without significant stenosis. T10-11: Minimal disc bulge. Bilateral facet hypertrophy. No spinal stenosis. Mild left neural foraminal narrowing. T11-12: Mild disc bulge. Bilateral facet hypertrophy. No significant spinal stenosis. Moderate left with mild right neural foraminal narrowing. T12-L1: Diffuse disc bulge. Left-sided facet hypertrophy. No significant spinal stenosis. Mild left neural foraminal narrowing. MRI LUMBAR SPINE FINDINGS Segmentation: Normal segmentation. Lowest well-formed disc labeled the L5-S1 level. Alignment: Levoscoliosis. Alignment otherwise normal with preservation of the normal lumbar lordosis. No listhesis. Vertebrae: Vertebral body heights maintained without evidence for acute or chronic fracture. There is an acute nondisplaced fracture extending through the S2 segment (series 18, image 8). No other definite fracture identified. Underlying bone marrow signal intensity heterogeneous but within normal limits. No worrisome osseous lesions. Conus medullaris and  cauda equina: Conus extends to the T12 level. Conus and cauda equina appear normal. Paraspinal and other soft tissues: Visualized paraspinous soft tissues demonstrate no acute finding. Few scattered renal cysts noted bilaterally. Visualized visceral structures otherwise unremarkable. Disc levels: L1-2: Diffuse disc bulge with disc desiccation and intervertebral disc space narrowing. Right-sided reactive endplate changes. Mild facet hypertrophy. No canal stenosis. Foramina remain patent. L2-3: Chronic intervertebral disc space narrowing with diffuse disc bulge and disc desiccation. Mild facet hypertrophy. Resultant mild spinal stenosis. Mild right L2  foraminal narrowing. L3-4: Chronic intervertebral disc space narrowing with diffuse disc bulge and disc desiccation. Disc bulging asymmetric to the right with right-sided reactive endplate changes. Moderate facet and ligament flavum hypertrophy. Resultant mild canal with bilateral lateral recess stenosis. Mild to moderate bilateral L3 foraminal narrowing. L4-5: Chronic intervertebral disc space narrowing with diffuse disc bulge and disc desiccation. Moderate facet ligament flavum hypertrophy. Resultant moderate canal with bilateral subarticular stenosis. Moderate left with mild to moderate right L4 foraminal narrowing. L5-S1: Shallow posterior disc bulge. Severe bilateral facet arthrosis. No made of a small 5 mm synovial cyst at the anteromedial aspect of the right L5-S1 facet. Resultant mild canal with moderate bilateral lateral recess narrowing. Foramina remain patent. IMPRESSION: MRI CERVICAL SPINE IMPRESSION: 1. Degenerative disc osteophyte with facet hypertrophy at C6-7 with resultant severe spinal stenosis and secondary cord compression. Abnormal cord signal intensity at this level most likely reflects contusion/edema related to recent trauma/fall. Neurosurgical consultation recommended. 2. Soft tissue edema adjacent to the right C6-7 facet as well as the C5  through C7 spinous processes, likely posttraumatic in nature given the adjacent cord injury. No definite discrete fracture line identified. 3. 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 Associated multilevel moderate to severe foraminal narrowing as detailed above. MRI THORACIC SPINE IMPRESSION: 1. No acute traumatic injury within the thoracic spine. 2. Multilevel degenerative spondylolysis with small disc protrusions at T7-8 and T8-9. No significant spinal stenosis. MRI LUMBAR SPINE IMPRESSION: 1. Acute nondisplaced sacral fracture, S2 segment. 2. 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 Resultant mild to moderate diffuse spinal stenosis, most pronounced at the L4-5 level. 3. Multifactorial degenerative changes with resultant multilevel foraminal narrowing as above. Notable findings include mild right L2, with mild to moderate bilateral L3 and L4 foraminal stenosis. Findings were discussed by telephone with Dr. Randol Kern at 2:45 p.m. on 02/27/2018. Electronically Signed   By: Rise Mu M.D.   On: 02/27/2018 14:46   Mr Thoracic Spine Wo Contrast  Result Date: 02/27/2018 CLINICAL DATA:  Initial evaluation for generalized muscle weakness status post recent fall on 02/21/2018. EXAM: MRI CERVICAL, THORACIC AND LUMBAR SPINE WITHOUT CONTRAST TECHNIQUE: Multiplanar and multiecho pulse sequences of the cervical spine, to include the craniocervical junction and cervicothoracic junction, and thoracic and lumbar spine, were obtained without intravenous contrast. COMPARISON:  Prior CT from 02/25/2018 FINDINGS: MRI CERVICAL SPINE FINDINGS Alignment: Straightening with smooth reversal of the normal cervical lordosis. Trace anterolisthesis of C2 on C3, stable. Vertebrae: Vertebral body heights maintained without evidence for acute or chronic fracture. Heterogeneous bone marrow signal intensity seen at C3-4 through  T1-2 due to underlying degenerative spondylolysis. No discrete or worrisome osseous lesions. Cord: Patchy abnormal T2 signal abnormality seen within the cervical spinal cord at the level of C6-7, concerning for possible cord contusion/edema given provided history. No associated hemorrhage. Signal intensity within the cervical spinal cord otherwise normal. Posterior Fossa, vertebral arteries, paraspinal tissues: Visualized brain demonstrates no acute abnormality. Retrocerebellar cystic space could reflect mega cisterna magna versus arachnoid cyst. Craniocervical junction normal. Soft tissue edema adjacent to the right C6-7 facet, suspected to be posttraumatic in nature given adjacent cord injury (series 5, image 2). No definite discrete fracture line about the adjacent facet. Additionally, mild soft tissue edema within the posterior soft tissues adjacent to the spinous processes of C5 through C7, also likely posttraumatic (series 5, image 8). Patient is intubated with fluid in the nasopharynx. Abnormal flow void within the left vertebral artery,  likely occluded. Finding is age indeterminate. Normal flow void within the dominant right vertebral artery. Disc levels: C2-C3: Trace anterolisthesis. Shallow central disc protrusion mildly indents the ventral thecal sac. Prominent left-sided facet degeneration. Resultant moderate left C3 foraminal stenosis. No significant canal narrowing. Right neural foraminal on the patent. C3-C4: Diffuse degenerative disc osteophyte with intervertebral disc space narrowing. Broad posterior component, asymmetric to the left flattens and partially faces the ventral thecal sac with resultant mild spinal stenosis. No significant cord deformity. Superimposed bilateral facet hypertrophy. Resultant severe left with moderate right C4 foraminal narrowing. C4-C5: Diffuse degenerative disc osteophyte with intervertebral disc space narrowing with bilateral facet hypertrophy. Flattening of the ventral  thecal sac with resultant mild spinal stenosis. Severe bilateral C5 foraminal narrowing. C5-C6: Diffuse disc osteophyte with intervertebral disc space narrowing. Broad posterior component indents and partially faces the ventral CSF. Resultant mild to moderate spinal stenosis without cord deformity. Bilateral facet hypertrophy. Resultant moderate bilateral C6 foraminal narrowing. C6-C7: Diffuse degenerative disc osteophyte with intervertebral disc space narrowing and bilateral uncovertebral hypertrophy. Broad posterior disc osteophyte complex (mostly disc) effaces the ventral thecal sac, impinging upon the cervical spinal cord. Superimposed facet and ligament flavum hypertrophy. Resultant severe spinal stenosis with cord flattening. Thecal sac measures approximately 4 mm in AP diameter at its most narrow point. Associated cord signal abnormality, likely contusion/edema. Severe bilateral C7 foraminal stenosis. C7-T1: Diffuse degenerative disc osteophyte with intervertebral disc space narrowing and left greater than right uncovertebral spurring. No significant spinal stenosis. Severe bilateral C8 foraminal stenosis. MRI THORACIC SPINE FINDINGS Alignment: Vertebral bodies normally aligned with preservation of the normal thoracic kyphosis. No listhesis. Vertebrae: Vertebral body heights maintained without evidence for acute or chronic fracture. Bone marrow signal intensity within normal limits. Benign hemangioma noted within the T8 vertebral body. No other discrete or worrisome osseous lesions. No abnormal marrow edema. Cord: Signal intensity within the thoracic spinal cord is normal. No evidence for traumatic cord injury. Paraspinal and other soft tissues: Paraspinous soft tissues demonstrate no acute finding. Patchy opacity within the partially visualized posterior right lower lobe, which could reflect atelectasis or infiltrate. Partially visualized visceral structures otherwise grossly unremarkable. Disc levels: T1-2:  Mild diffuse disc bulge with bilateral facet hypertrophy. No significant spinal stenosis. Mild to moderate bilateral foraminal narrowing. T7-8: Tiny central disc protrusion mildly indents the ventral thecal sac. No significant stenosis. T8-9: Central disc protrusion indents the ventral thecal sac. Minimal flattening of the ventral cord without significant stenosis. T10-11: Minimal disc bulge. Bilateral facet hypertrophy. No spinal stenosis. Mild left neural foraminal narrowing. T11-12: Mild disc bulge. Bilateral facet hypertrophy. No significant spinal stenosis. Moderate left with mild right neural foraminal narrowing. T12-L1: Diffuse disc bulge. Left-sided facet hypertrophy. No significant spinal stenosis. Mild left neural foraminal narrowing. MRI LUMBAR SPINE FINDINGS Segmentation: Normal segmentation. Lowest well-formed disc labeled the L5-S1 level. Alignment: Levoscoliosis. Alignment otherwise normal with preservation of the normal lumbar lordosis. No listhesis. Vertebrae: Vertebral body heights maintained without evidence for acute or chronic fracture. There is an acute nondisplaced fracture extending through the S2 segment (series 18, image 8). No other definite fracture identified. Underlying bone marrow signal intensity heterogeneous but within normal limits. No worrisome osseous lesions. Conus medullaris and cauda equina: Conus extends to the T12 level. Conus and cauda equina appear normal. Paraspinal and other soft tissues: Visualized paraspinous soft tissues demonstrate no acute finding. Few scattered renal cysts noted bilaterally. Visualized visceral structures otherwise unremarkable. Disc levels: L1-2: Diffuse disc bulge with disc desiccation and intervertebral disc space  narrowing. Right-sided reactive endplate changes. Mild facet hypertrophy. No canal stenosis. Foramina remain patent. L2-3: Chronic intervertebral disc space narrowing with diffuse disc bulge and disc desiccation. Mild facet  hypertrophy. Resultant mild spinal stenosis. Mild right L2 foraminal narrowing. L3-4: Chronic intervertebral disc space narrowing with diffuse disc bulge and disc desiccation. Disc bulging asymmetric to the right with right-sided reactive endplate changes. Moderate facet and ligament flavum hypertrophy. Resultant mild canal with bilateral lateral recess stenosis. Mild to moderate bilateral L3 foraminal narrowing. L4-5: Chronic intervertebral disc space narrowing with diffuse disc bulge and disc desiccation. Moderate facet ligament flavum hypertrophy. Resultant moderate canal with bilateral subarticular stenosis. Moderate left with mild to moderate right L4 foraminal narrowing. L5-S1: Shallow posterior disc bulge. Severe bilateral facet arthrosis. No made of a small 5 mm synovial cyst at the anteromedial aspect of the right L5-S1 facet. Resultant mild canal with moderate bilateral lateral recess narrowing. Foramina remain patent. IMPRESSION: MRI CERVICAL SPINE IMPRESSION: 1. Degenerative disc osteophyte with facet hypertrophy at C6-7 with resultant severe spinal stenosis and secondary cord compression. Abnormal cord signal intensity at this level most likely reflects contusion/edema related to recent trauma/fall. Neurosurgical consultation recommended. 2. Soft tissue edema adjacent to the right C6-7 facet as well as the C5 through C7 spinous processes, likely posttraumatic in nature given the adjacent cord injury. No definite discrete fracture line identified. 3. 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 Associated multilevel moderate to severe foraminal narrowing as detailed above. MRI THORACIC SPINE IMPRESSION: 1. No acute traumatic injury within the thoracic spine. 2. Multilevel degenerative spondylolysis with small disc protrusions at T7-8 and T8-9. No significant spinal stenosis. MRI LUMBAR SPINE IMPRESSION: 1. Acute nondisplaced sacral  fracture, S2 segment. 2. 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 Resultant mild to moderate diffuse spinal stenosis, most pronounced at the L4-5 level. 3. Multifactorial degenerative changes with resultant multilevel foraminal narrowing as above. Notable findings include mild right L2, with mild to moderate bilateral L3 and L4 foraminal stenosis. Findings were discussed by telephone with Dr. Randol Kern at 2:45 p.m. on 02/27/2018. Electronically Signed   By: Rise Mu M.D.   On: 02/27/2018 14:46   Mr Lumbar Spine Wo Contrast  Result Date: 02/27/2018 CLINICAL DATA:  Initial evaluation for generalized muscle weakness status post recent fall on 02/21/2018. EXAM: MRI CERVICAL, THORACIC AND LUMBAR SPINE WITHOUT CONTRAST TECHNIQUE: Multiplanar and multiecho pulse sequences of the cervical spine, to include the craniocervical junction and cervicothoracic junction, and thoracic and lumbar spine, were obtained without intravenous contrast. COMPARISON:  Prior CT from 02/25/2018 FINDINGS: MRI CERVICAL SPINE FINDINGS Alignment: Straightening with smooth reversal of the normal cervical lordosis. Trace anterolisthesis of C2 on C3, stable. Vertebrae: Vertebral body heights maintained without evidence for acute or chronic fracture. Heterogeneous bone marrow signal intensity seen at C3-4 through T1-2 due to underlying degenerative spondylolysis. No discrete or worrisome osseous lesions. Cord: Patchy abnormal T2 signal abnormality seen within the cervical spinal cord at the level of C6-7, concerning for possible cord contusion/edema given provided history. No associated hemorrhage. Signal intensity within the cervical spinal cord otherwise normal. Posterior Fossa, vertebral arteries, paraspinal tissues: Visualized brain demonstrates no acute abnormality. Retrocerebellar cystic space could reflect mega cisterna magna versus arachnoid cyst. Craniocervical junction normal. Soft tissue  edema adjacent to the right C6-7 facet, suspected to be posttraumatic in nature given adjacent cord injury (series 5, image 2). No definite discrete fracture line about the adjacent facet. Additionally, mild soft tissue  edema within the posterior soft tissues adjacent to the spinous processes of C5 through C7, also likely posttraumatic (series 5, image 8). Patient is intubated with fluid in the nasopharynx. Abnormal flow void within the left vertebral artery, likely occluded. Finding is age indeterminate. Normal flow void within the dominant right vertebral artery. Disc levels: C2-C3: Trace anterolisthesis. Shallow central disc protrusion mildly indents the ventral thecal sac. Prominent left-sided facet degeneration. Resultant moderate left C3 foraminal stenosis. No significant canal narrowing. Right neural foraminal on the patent. C3-C4: Diffuse degenerative disc osteophyte with intervertebral disc space narrowing. Broad posterior component, asymmetric to the left flattens and partially faces the ventral thecal sac with resultant mild spinal stenosis. No significant cord deformity. Superimposed bilateral facet hypertrophy. Resultant severe left with moderate right C4 foraminal narrowing. C4-C5: Diffuse degenerative disc osteophyte with intervertebral disc space narrowing with bilateral facet hypertrophy. Flattening of the ventral thecal sac with resultant mild spinal stenosis. Severe bilateral C5 foraminal narrowing. C5-C6: Diffuse disc osteophyte with intervertebral disc space narrowing. Broad posterior component indents and partially faces the ventral CSF. Resultant mild to moderate spinal stenosis without cord deformity. Bilateral facet hypertrophy. Resultant moderate bilateral C6 foraminal narrowing. C6-C7: Diffuse degenerative disc osteophyte with intervertebral disc space narrowing and bilateral uncovertebral hypertrophy. Broad posterior disc osteophyte complex (mostly disc) effaces the ventral thecal sac,  impinging upon the cervical spinal cord. Superimposed facet and ligament flavum hypertrophy. Resultant severe spinal stenosis with cord flattening. Thecal sac measures approximately 4 mm in AP diameter at its most narrow point. Associated cord signal abnormality, likely contusion/edema. Severe bilateral C7 foraminal stenosis. C7-T1: Diffuse degenerative disc osteophyte with intervertebral disc space narrowing and left greater than right uncovertebral spurring. No significant spinal stenosis. Severe bilateral C8 foraminal stenosis. MRI THORACIC SPINE FINDINGS Alignment: Vertebral bodies normally aligned with preservation of the normal thoracic kyphosis. No listhesis. Vertebrae: Vertebral body heights maintained without evidence for acute or chronic fracture. Bone marrow signal intensity within normal limits. Benign hemangioma noted within the T8 vertebral body. No other discrete or worrisome osseous lesions. No abnormal marrow edema. Cord: Signal intensity within the thoracic spinal cord is normal. No evidence for traumatic cord injury. Paraspinal and other soft tissues: Paraspinous soft tissues demonstrate no acute finding. Patchy opacity within the partially visualized posterior right lower lobe, which could reflect atelectasis or infiltrate. Partially visualized visceral structures otherwise grossly unremarkable. Disc levels: T1-2: Mild diffuse disc bulge with bilateral facet hypertrophy. No significant spinal stenosis. Mild to moderate bilateral foraminal narrowing. T7-8: Tiny central disc protrusion mildly indents the ventral thecal sac. No significant stenosis. T8-9: Central disc protrusion indents the ventral thecal sac. Minimal flattening of the ventral cord without significant stenosis. T10-11: Minimal disc bulge. Bilateral facet hypertrophy. No spinal stenosis. Mild left neural foraminal narrowing. T11-12: Mild disc bulge. Bilateral facet hypertrophy. No significant spinal stenosis. Moderate left with mild  right neural foraminal narrowing. T12-L1: Diffuse disc bulge. Left-sided facet hypertrophy. No significant spinal stenosis. Mild left neural foraminal narrowing. MRI LUMBAR SPINE FINDINGS Segmentation: Normal segmentation. Lowest well-formed disc labeled the L5-S1 level. Alignment: Levoscoliosis. Alignment otherwise normal with preservation of the normal lumbar lordosis. No listhesis. Vertebrae: Vertebral body heights maintained without evidence for acute or chronic fracture. There is an acute nondisplaced fracture extending through the S2 segment (series 18, image 8). No other definite fracture identified. Underlying bone marrow signal intensity heterogeneous but within normal limits. No worrisome osseous lesions. Conus medullaris and cauda equina: Conus extends to the T12 level. Conus and cauda equina appear  normal. Paraspinal and other soft tissues: Visualized paraspinous soft tissues demonstrate no acute finding. Few scattered renal cysts noted bilaterally. Visualized visceral structures otherwise unremarkable. Disc levels: L1-2: Diffuse disc bulge with disc desiccation and intervertebral disc space narrowing. Right-sided reactive endplate changes. Mild facet hypertrophy. No canal stenosis. Foramina remain patent. L2-3: Chronic intervertebral disc space narrowing with diffuse disc bulge and disc desiccation. Mild facet hypertrophy. Resultant mild spinal stenosis. Mild right L2 foraminal narrowing. L3-4: Chronic intervertebral disc space narrowing with diffuse disc bulge and disc desiccation. Disc bulging asymmetric to the right with right-sided reactive endplate changes. Moderate facet and ligament flavum hypertrophy. Resultant mild canal with bilateral lateral recess stenosis. Mild to moderate bilateral L3 foraminal narrowing. L4-5: Chronic intervertebral disc space narrowing with diffuse disc bulge and disc desiccation. Moderate facet ligament flavum hypertrophy. Resultant moderate canal with bilateral  subarticular stenosis. Moderate left with mild to moderate right L4 foraminal narrowing. L5-S1: Shallow posterior disc bulge. Severe bilateral facet arthrosis. No made of a small 5 mm synovial cyst at the anteromedial aspect of the right L5-S1 facet. Resultant mild canal with moderate bilateral lateral recess narrowing. Foramina remain patent. IMPRESSION: MRI CERVICAL SPINE IMPRESSION: 1. Degenerative disc osteophyte with facet hypertrophy at C6-7 with resultant severe spinal stenosis and secondary cord compression. Abnormal cord signal intensity at this level most likely reflects contusion/edema related to recent trauma/fall. Neurosurgical consultation recommended. 2. Soft tissue edema adjacent to the right C6-7 facet as well as the C5 through C7 spinous processes, likely posttraumatic in nature given the adjacent cord injury. No definite discrete fracture line identified. 3. 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 Associated multilevel moderate to severe foraminal narrowing as detailed above. MRI THORACIC SPINE IMPRESSION: 1. No acute traumatic injury within the thoracic spine. 2. Multilevel degenerative spondylolysis with small disc protrusions at T7-8 and T8-9. No significant spinal stenosis. MRI LUMBAR SPINE IMPRESSION: 1. Acute nondisplaced sacral fracture, S2 segment. 2. 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 Resultant mild to moderate diffuse spinal stenosis, most pronounced at the L4-5 level. 3. Multifactorial degenerative changes with resultant multilevel foraminal narrowing as above. Notable findings include mild right L2, with mild to moderate bilateral L3 and L4 foraminal stenosis. Findings were discussed by telephone with Dr. Randol Kern at 2:45 p.m. on 02/27/2018. Electronically Signed   By: Rise Mu M.D.   On: 02/27/2018 14:46   Dg C-arm 1-60 Min  Result Date: 02/28/2018 CLINICAL  DATA:  Cervical decompression and fusion at C6-7 EXAM: DG C-ARM 61-120 MIN; CERVICAL SPINE - 2-3 VIEW COMPARISON:  None. FLUOROSCOPY TIME:  Radiation Exposure Index (as provided by the fluoroscopic device): Not available If the device does not provide the exposure index: Fluoroscopy Time:  18 seconds Number of Acquired Images:  2 FINDINGS: Initial lateral images demonstrate diffuse degenerative changes of the cervical spine. The inferior aspect of the cervical spine is not well visualized due to overlying shoulder artifact. Second image demonstrates evidence of fusion at C6-7 with anterior fixation. IMPRESSION: C6-7 fusion. Electronically Signed   By: Alcide Clever M.D.   On: 02/28/2018 09:52   Dg C-arm 1-60 Min  Result Date: 02/28/2018 CLINICAL DATA:  Cervical decompression and fusion at C6-7 EXAM: DG C-ARM 61-120 MIN; CERVICAL SPINE - 2-3 VIEW COMPARISON:  None. FLUOROSCOPY TIME:  Radiation Exposure Index (as provided by the fluoroscopic device): Not available If the device does not provide the exposure index: Fluoroscopy Time:  18 seconds Number of Acquired Images:  2 FINDINGS: Initial lateral images demonstrate diffuse degenerative changes of the cervical spine. The inferior aspect of the cervical spine is not well visualized due to overlying shoulder artifact. Second image demonstrates evidence of fusion at C6-7 with anterior fixation. IMPRESSION: C6-7 fusion. Electronically Signed   By: Alcide Clever M.D.   On: 02/28/2018 09:52    Assessment/Plan: Postop day 2 anterior cervical discectomy and fusion at C6-7 making significant improvements in lower externally function.  Still with DVT left lower extremity significant blood pressure issues being managed by medicine.  Patient apparently has transfer orders to the floor available and will be a candidate for rehab probably the first of next week.  I would prefer him not to be anticoagulated for at least 72 hours after surgery and preferably 2 weeks.  And when  anti-coagulation is initiated, I would prefer not to have bolus heparin dosing.  LOS: 3 days     Mykal Batiz P 03/01/2018, 12:49 PM

## 2018-03-01 NOTE — Progress Notes (Addendum)
  Speech Language Pathology Treatment: Dysphagia  Patient Details Name: Wayne Buckley MRN: [id number] DOB: 03/28/1951 Today's Date: 03/01/2018 Time: [id number] SLP Time Calculation (min) (ACUTE ONLY): 11 min  Assessment / Plan / Recommendation Clinical Impression  Pt consumed thin liquids and few bites of puree to assess for potential for diet advancement. His vocal quality is strong, and outwardly he appears to swallow swiftly. One delayed cough was noted after PO trials as education was being provided. It was not wet in quality. Pt is ready to attempt advancement of solid foods from SLP standpoint, likely to pureed or soft textures. Will defer advancement to surgical team, who SLP paged regarding recommendations. For now will continue thin liquids and will f/u to assess tolerance as solids progress.   ADDENDUM: SLP received return page from surgical team, who says that there are no texture restrictions from their standpoint. Will advance pt to soft solid diet and continue SLP f/u.   HPI HPI: 67 yr old gentleman with pMHx for TBI, ETOH abuse, and most recently a series of falls.  Pt found to have a new LLE DVT, now s/p IVC filter and POD 0 s/p ACDF for C6-7 disk herniation with cord compression 11/28/17, currently on clear liquids per MD. Tenna ChildSwallow evaluation ordered. Pt known to SLP service [facility]; required solid texture modifications due to oral dysphagia/pocketing but progressed to dys 3, thin liquids at time of discharge (05/28/12).       SLP Plan  Continue with current plan of care       Recommendations  Diet recommendations: Thin liquid;Other(comment)(advance solids per MD; appropraite from SLP standpoint) Liquids provided via: Cup;Straw Medication Administration: Whole meds with liquid Supervision: Staff to assist with self feeding;Intermittent supervision to cue for compensatory strategies Compensations: Slow rate;Small sips/bites;Minimize environmental  distractions Postural Changes and/or Swallow Maneuvers: Seated upright 90 degrees                Oral Care Recommendations: Oral care BID Follow up Recommendations: Inpatient Rehab SLP Visit Diagnosis: Dysphagia, unspecified (R13.10) Plan: Continue with current plan of care       GO                Maxcine Hamaiewonsky, Torunn Chancellor 03/01/2018, 11:38 AM  Maxcine HamLaura Paiewonsky, M.A. CCC-SLP Acute Herbalistehabilitation Services Pager [phone] Office [phone]

## 2018-03-01 NOTE — OR Nursing (Signed)
Late entry addendum due to epic down time.

## 2018-03-01 NOTE — Evaluation (Signed)
Occupational Therapy Evaluation Patient Details Name: Wayne Buckley MRN: [id number] DOB: [birth year] Today's Date: 03/01/2018    History of Present Illness 67 yr old gentleman with pMHx for TBI, ETOH abuse, and most recently a series of falls.  Pt found to have a new LLE DVT, now s/p IVC filter and s/p ACDF for C6-7 disk herniation with cord compression 11/28/17   Clinical Impression   Pt is typically independent in ambulation and can perform self care, meal prep and drive independently. Pt is described by his wife as "stubborn" at baseline, refusing to use a cane. Pt present with impulsivity and poor attention. His awareness of deficits improved as he sat EOB and required min guard to max assist to maintain. 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 Pt requires 2 person assist for bed level mobility and lift equipment for OOB. He needs supervision to total assist for ADL. Recommending intensive rehab in CIR. Wife reports pt received therapy in the past in CIR. Will follow acutely.    Follow Up Recommendations  [facility]    Equipment Recommendations  Wheelchair (measurements OT);Wheelchair cushion (measurements OT);Hospital bed(drop arm commode)    Recommendations for Other Services Rehab consult     Precautions / Restrictions Precautions Precautions: Fall;Cervical Restrictions Weight Bearing Restrictions: No      Mobility Bed Mobility Overal bed mobility: Needs Assistance Bed Mobility: Supine to Sit     Supine to sit: +2 for physical assistance;Max assist     General bed mobility comments: step by step verbal cues and assist for LEs extremities to EOB, to raise trunk and position hips at EOB with pad  Transfers Overall transfer level: Needs assistance                    Balance Overall balance assessment: Needs assistance Sitting-balance support: Bilateral upper extremity supported Sitting balance-[name] Scale: Poor   Postural control: Posterior lean                                  ADL either performed or assessed with clinical judgement   ADL Overall ADL's : Needs assistance/impaired Eating/Feeding: Supervision/ safety;Set up;Sitting   Grooming: Moderate assistance;Sitting   Upper Body Bathing: Maximal assistance;Sitting   Lower Body Bathing: Total assistance;Bed level   Upper Body Dressing : Maximal assistance;Sitting   Lower Body Dressing: Total assistance;Bed level               Functional mobility during ADLs: (requires lift)       Vision Baseline Vision/History: (wears contacts) Patient Visual Report: No change from baseline       Perception     Praxis      Pertinent Vitals/Pain Pain Assessment: Faces Faces Pain Scale: Hurts little more Pain Location: back Pain Descriptors / Indicators: Grimacing Pain Intervention(s): Monitored during session     Hand Dominance (primarily using L hand, but reports being ambidextrous)   Extremity/Trunk Assessment Upper Extremity Assessment Upper Extremity Assessment: RUE deficits/detail;LUE deficits/detail;Generalized weakness RUE Deficits / Details: ataxia RUE Coordination: decreased fine motor;decreased gross motor LUE Deficits / Details: ataxia LUE Coordination: decreased fine motor;decreased gross motor   Lower Extremity Assessment Lower Extremity Assessment: Defer to PT evaluation   Cervical / Trunk Assessment Cervical / Trunk Assessment: Other exceptions(s/p ACDF, weakness, ataxia)   Communication Communication Communication: Expressive difficulties(slurred speech)   Cognition Arousal/Alertness: Awake/alert Behavior During Therapy: Impulsive Overall Cognitive Status: Impaired/Different from baseline  Area of Impairment: Orientation;Attention;Memory;Following commands;Safety/judgement;Awareness;Problem solving                 Orientation Level: Disoriented to;Time(new date) Current Attention Level: Focused Memory: Decreased short-term  memory Following Commands: Follows one step commands with increased time(and mulitmodal cues) Safety/Judgement: Decreased awareness of safety;Decreased awareness of deficits Awareness: Intellectual Problem Solving: Slow processing;Decreased initiation;Difficulty sequencing;Requires verbal cues;Requires tactile cues General Comments: pt realizing he should not attempt standing once he was seated at EOB   General Comments       Exercises     Shoulder Instructions      Home Living Family/patient expects to be discharged to:: Private residence Living Arrangements: Spouse/significant other Available Help at Discharge: Family;Available 24 hours/day Type of Home: House Home Access: Stairs to enter Entergy CorporationEntrance Stairs-Number of Steps: 4   Home Layout: Two level;Able to live on main level with bedroom/bathroom     Bathroom Shower/Tub: Producer, television/film/videoWalk-in shower   Bathroom Toilet: Standard     Home Equipment: Environmental consultantWalker - 2 wheels;Cane - single point          Prior Functioning/Environment Level of Independence: Independent        Comments: refused to use any device for ambulation, pt was driving, retired Investment banker, operationalchef, wife describes pt as "stubborn"        OT Problem List: Decreased strength;Decreased activity tolerance;Impaired balance (sitting and/or standing);Decreased coordination;Decreased cognition;Decreased safety awareness;Decreased knowledge of use of DME or AE;Impaired UE functional use;Pain      OT Treatment/Interventions: Self-care/ADL training;DME and/or AE instruction;Therapeutic activities;Cognitive remediation/compensation;Patient/family education;Balance training    OT Goals(Current goals can be found in the care plan section) Acute Rehab OT Goals Patient Stated Goal: to walk OT Goal Formulation: With patient Time For Goal Achievement: 03/15/18 Potential to Achieve Goals: Good ADL Goals Pt Will Perform Grooming: with supervision;with set-up;sitting Pt Will Perform Upper Body  Bathing: with min assist;sitting Pt Will Perform Upper Body Dressing: with min assist;sitting Pt Will Transfer to Toilet: with transfer board;with mod assist;bedside commode Additional ADL Goal #1: Pt wil sit EOB x 10 minutes with min guard assist in preparation for ADL.  OT Frequency: Min 3X/week   Barriers to D/C:            Co-evaluation PT/OT/SLP Co-Evaluation/Treatment: Yes Reason for Co-Treatment: For patient/therapist safety;Necessary to address cognition/behavior during functional activity   OT goals addressed during session: ADL's and self-care      AM-PAC PT "6 Clicks" Daily Activity     Outcome Measure Help from another person eating meals?: A Little Help from another person taking care of personal grooming?: A Lot Help from another person toileting, which includes using toliet, bedpan, or urinal?: Total Help from another person bathing (including washing, rinsing, drying)?: A Lot Help from another person to put on and taking off regular upper body clothing?: Total Help from another person to put on and taking off regular lower body clothing?: A Lot 6 Click Score: 11   End of Session Nurse Communication: Need for lift equipment  Activity Tolerance: Patient tolerated treatment well Patient left: in chair;with call bell/phone within reach;with chair alarm set;with family/visitor present  OT Visit Diagnosis: Muscle weakness (generalized) (M62.81);History of falling (Z91.81);Other symptoms and signs involving cognitive function                Time: [id number] OT Time Calculation (min): 47 min Charges:  OT General Charges $OT Visit: 1 Visit OT Evaluation $OT Eval Moderate Complexity: 1 Mod OT Treatments $Self Care/Home Management : 8-22 mins  Martie Round, OTR/L Acute Rehabilitation Services Pager: [phone] Office: [phone]  Evern Bio 03/01/2018, 10:38 AM

## 2018-03-01 NOTE — Progress Notes (Signed)
PROGRESS NOTE  [name]  ZOX:[id number] DOB: June 09, 1951 DOA: 02/25/2018 PCP: [name], MD   Brief Narrative: Wayne Buckley is a 67 y.o. male with a history of TBI with persistent AMS, EtOH abuse who presented to the ED for 2 falls at home, after the second fall he was unable to move his legs. He was found to have LLE DVT and large disc herniation at C6-7. IVC filter placed 9/11 prior to ACDF by Dr. Wynetta Emery. He was agitated postextubation, monitored in the ICU, and has since normalized. Anticoagulation is to be held per neurosurgery for now, and CIR is consulted for rehabilitation admission.  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 MRI delayed due to need for anesthesia due to choreoathetosis.  - s/p ACDF 9/11 by Dr. Wynetta Emery.  - Continue PT/OT, pain control - ADAT per SLP - Decadron 4mg  q6h   Extensive left lower extremity DVT:  Involving the common femoral femoral, femoral, profunda, popliteal , peroneal, posterior tibial, gastrocnemius, left external iliac. - s/p IVC filter due to need for neurosurgery. Will initiate anticoagulation once cleared by neurosurgery.  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  - Monitor daily, has resolved. 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 At mental baseline per wife.   HTN:  - Norvasc, increase dose to 10mg . Add extra 5mg  dose now. Previously added HCTZ.  PMH alcohol abuse: Per wife "occasionally drinks a  beer". - Continue MVM, thiamine  Acute encephalopathy: Following extubation 9/11. Observed in ICU, resolved spontaneously.   DVT prophylaxis: SCD Code Status: Full Family Communication: [name] Disposition Plan: Transfer to med-surg, anticipate eventual disposition to CIR vs. SNF.  Consultants:   Neurosurgery  PCCM  Vascular surgery  Procedures:  02/27/18 MRI WITH ANESTHESIA    02/27/18 INSERTION VENA-CAVA FILTER Nada Libman, MD    02/27/18 ANTERIOR CERVICAL DECOMPRESSION/DISCECTOMY FUSION C6-7 Donalee Citrin, MD   Antimicrobials:  None   Subjective: Feels strength is improving. At mental baseline. Tired from work with PT today.  Objective: Vitals:   03/01/18 1039 03/01/18 1117 03/01/18 1200 03/01/18 1400  BP: (!) 173/94  (!) 148/131 (!) 171/109  Pulse:   (!) 103 (!) 117  Resp:   19 (!) 31  Temp:  98.3 F (36.8 C)    TempSrc:  Oral    SpO2:   98% 98%  Weight:      Height:        Intake/Output Summary (Last 24 hours) at 03/01/2018 1420 Last data filed at 03/01/2018 1200 Gross per 24 hour  Intake 2779.89 ml  Output 1975 ml  Net 804.89 ml   Filed Weights   02/25/18 1320 02/27/18 1056 02/28/18 0430  Weight: 91 kg 91 kg 96.9 kg    Gen: 67 y.o. male in no distress Pulm: Non-labored breathing room air. Clear to auscultation bilaterally.  CV: Regular rate and rhythm. No murmur, rub, or gallop. No JVD, LLE nonpitting, nontender edema. GI: Abdomen soft, non-tender,  non-distended, with normoactive bowel sounds. No organomegaly or masses felt. Ext: Warm, no deformities Skin: Midline anterior neck incision with dressing c/d/i, no surrounding erythema/discharge. Neuro: Alert and oriented, +choreoathetosis. Decreased strength in L > R legs. Psych: Judgement and insight appear impaired. Mood & affect appropriate.   Data Reviewed: I have personally reviewed following labs and imaging studies  CBC: Recent Labs  Lab 02/25/18 1413 02/26/18 0407 02/27/18 2041    WBC 14.4* 10.8* 12.5*  NEUTROABS 11.5*  --   --   HGB 14.7 12.8* 12.1*  HCT 44.1 38.6* 37.5*  MCV 89.3 89.1 90.6  PLT 291 258 320   Basic Metabolic Panel: Recent Labs  Lab 02/25/18 1413 02/26/18 0407 02/27/18 2041 03/01/18 0240  NA 136 133* 143 137  K 4.2 3.6 4.6 3.8  CL 99 100 110 105  CO2 22 21* 23 23  GLUCOSE 111* 122* 139* 154*  BUN 73* 74* 35* 21  CREATININE 2.79* 2.75* 1.05 0.76  CALCIUM 9.0 8.0* 8.3* 8.2*  MG 3.1*  --   --   --   PHOS 6.4*  --   --   --    GFR: Estimated Creatinine Clearance: 104.9 mL/min (by C-G formula based on SCr of 0.76 mg/dL). Liver Function Tests: Recent Labs  Lab 02/25/18 1413  AST 25  ALT 33  ALKPHOS 96  BILITOT 1.4*  PROT 7.0  ALBUMIN 3.4*   No results for input(s): LIPASE, AMYLASE in the last 168 hours. No results for input(s): AMMONIA in the last 168 hours. Coagulation Profile: Recent Labs  Lab 02/25/18 1413  INR 1.16   Cardiac Enzymes: Recent Labs  Lab 02/26/18 0407  CKTOTAL 97   BNP (last 3 results) No results for input(s): PROBNP in the last 8760 hours. HbA1C: No results for input(s): HGBA1C in the last 72 hours. CBG: Recent Labs  Lab 02/28/18 0426  GLUCAP 137*   Lipid Profile: No results for input(s): CHOL, HDL, LDLCALC, TRIG, CHOLHDL, LDLDIRECT in the last 72 hours. Thyroid Function Tests: No results for input(s): TSH, T4TOTAL, FREET4, T3FREE, THYROIDAB in the last 72 hours. Anemia Panel: No results for input(s): VITAMINB12, FOLATE, FERRITIN, TIBC, IRON, RETICCTPCT in the last 72 hours. Urine analysis:    Component Value Date/Time   COLORURINE YELLOW 02/28/2018 2220   APPEARANCEUR HAZY (A) 02/28/2018 2220   LABSPEC 1.011 02/28/2018 2220   PHURINE 5.0 02/28/2018 2220   GLUCOSEU NEGATIVE 02/28/2018 2220   HGBUR NEGATIVE 02/28/2018 2220   BILIRUBINUR NEGATIVE 02/28/2018 2220   KETONESUR NEGATIVE 02/28/2018 2220   PROTEINUR NEGATIVE 02/28/2018 2220   UROBILINOGEN 0.2 05/08/2012 0314   NITRITE  NEGATIVE 02/28/2018 2220   LEUKOCYTESUR SMALL (A) 02/28/2018 2220   Recent Results (from the past 240 hour(s))  MRSA PCR Screening     Status: None   Collection Time: 02/28/18  4:30 AM  Result Value Ref Range Status   MRSA by PCR NEGATIVE NEGATIVE Final    Comment:        The GeneXpert MRSA Assay (FDA approved for NASAL specimens only), is one component of a comprehensive MRSA colonization surveillance program. It is not intended to diagnose MRSA infection nor to guide or monitor treatment for MRSA infections. Performed at [facility], [address], [place], [place] [address]       Radiology Studies: Dg Cervical Spine 2 Or 3 Views  Result Date: 02/28/2018 CLINICAL DATA:  Cervical decompression and fusion at C6-7 EXAM: DG C-ARM 61-120 MIN; CERVICAL SPINE - 2-3  VIEW COMPARISON:  None. FLUOROSCOPY TIME:  Radiation Exposure Index (as provided by the fluoroscopic device): Not available If the device does not provide the exposure index: Fluoroscopy Time:  18 seconds Number of Acquired Images:  2 FINDINGS: Initial lateral images demonstrate diffuse degenerative changes of the cervical spine. The inferior aspect of the cervical spine is not well visualized due to overlying shoulder artifact. Second image demonstrates evidence of fusion at C6-7 with anterior fixation. IMPRESSION: C6-7 fusion. Electronically Signed   By: Alcide CleverMark  Lukens M.D.   On: 02/28/2018 09:52   Dg C-arm 1-60 Min  Result Date: 02/28/2018 CLINICAL DATA:  Cervical decompression and fusion at C6-7 EXAM: DG C-ARM 61-120 MIN; CERVICAL SPINE - 2-3 VIEW COMPARISON:  None. FLUOROSCOPY TIME:  Radiation Exposure Index (as provided by the fluoroscopic device): Not available If the device does not provide the exposure index: Fluoroscopy Time:  18 seconds Number of Acquired Images:  2 FINDINGS: Initial lateral images demonstrate diffuse degenerative changes of the cervical spine. The inferior aspect of the cervical spine is not well  visualized due to overlying shoulder artifact. Second image demonstrates evidence of fusion at C6-7 with anterior fixation. IMPRESSION: C6-7 fusion. Electronically Signed   By: Alcide CleverMark  Lukens M.D.   On: 02/28/2018 09:52   Dg C-arm 1-60 Min  Result Date: 02/28/2018 CLINICAL DATA:  Cervical decompression and fusion at C6-7 EXAM: DG C-ARM 61-120 MIN; CERVICAL SPINE - 2-3 VIEW COMPARISON:  None. FLUOROSCOPY TIME:  Radiation Exposure Index (as provided by the fluoroscopic device): Not available If the device does not provide the exposure index: Fluoroscopy Time:  18 seconds Number of Acquired Images:  2 FINDINGS: Initial lateral images demonstrate diffuse degenerative changes of the cervical spine. The inferior aspect of the cervical spine is not well visualized due to overlying shoulder artifact. Second image demonstrates evidence of fusion at C6-7 with anterior fixation. IMPRESSION: C6-7 fusion. Electronically Signed   By: Alcide CleverMark  Lukens M.D.   On: 02/28/2018 09:52    Scheduled Meds: . amLODipine  5 mg Oral Daily  . dexamethasone  4 mg Intravenous Q6H  . feeding supplement (ENSURE ENLIVE)  237 mL Oral BID BM  . folic acid  1 mg Oral Daily  . hydrochlorothiazide  25 mg Oral Daily  . LORazepam  1 mg Intravenous Once  . multivitamin with minerals  1 tablet Oral Daily  . pneumococcal 23 valent vaccine  0.5 mL Intramuscular Tomorrow-1000  . thiamine  250 mg Oral Daily  . vitamin C  1,000 mg Oral BID   Continuous Infusions: . sodium chloride 10 mL/hr at 02/27/18 1059  . sodium chloride 10 mL/hr at 02/28/18 1100  . dextrose 5 % and 0.45% NaCl 100 mL/hr at 03/01/18 1200     LOS: 3 days   Time spent: 25 minutes.  Tyrone Nineyan B Timmey Lamba, MD [facility] [email or web address] Password [email or web address] 03/01/2018, 2:20 PM

## 2018-03-01 NOTE — Progress Notes (Signed)
Rehab Admissions Coordinator Note:  Patient was screened by Clois DupesBoyette, Hasani Diemer Godwin for appropriateness for an Inpatient Acute Rehab Consult per OT recommendation. [facility]  At this time, we are recommending Inpatient Rehab consult.  Clois DupesBoyette, Kayron Kalmar Godwin 03/01/2018, 10:58 AM  I can be reached at [phone].

## 2018-03-01 NOTE — Progress Notes (Signed)
Physical Therapy Evaluation Patient Details Name: NICKHOLAS GOLDSTON MRN: [id number] DOB: Dec 03, 1950 Today's Date: 03/01/2018   History of Present Illness  67 yr old gentleman with pMHx for TBI, ETOH abuse, and most recently a series of falls.  Pt found to have a new LLE DVT, now s/p IVC filter and POD 0 s/p ACDF for C6-7 disk herniation with cord compression 11/28/17  Clinical Impression  PTA pt was ambulating without AD, recently visiting [place] and independent in ADLs, requiring assist for iADLs. Pt's wife reports that he probably should have been using at least a cane but that he was "too stubborn". 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 Pt currently is limited in safe mobility by decreased strength especially in his LE as well as decreased safety awareness. Pt able to initiate movement with UE an R LE to EoB, however requires maxAx2 to come to seated EoB. Pt with increased posterior lean and ataxic movement requiring min-mod A to maintain full upright. Pt able to lean back on bilateral extended UE to maintain balance for 5 sec with min guard. Therapist utilized lift equipment to move pt to chair given ataxia and limited LE strength. PT recommends CIR level rehab at discharge given pt's determination, family support paired with pt's familiarity with CIR from prior hospitalization. PT will continue to follow acutely.  Follow Up Recommendations CIR    Equipment Recommendations  (TBD at next venue )    Recommendations for Other Services       Precautions / Restrictions Precautions Precautions: Fall;Cervical Restrictions Weight Bearing Restrictions: No      Mobility  Bed Mobility Overal bed mobility: Needs Assistance Bed Mobility: Supine to Sit     Supine to sit: +2 for physical assistance;Max assist     General bed mobility comments: step by step verbal cues and assist for LEs extremities to EOB, to raise trunk and position hips at EOB with pad  Transfers Overall transfer level: Needs  assistance                        Balance Overall balance assessment: Needs assistance Sitting-balance support: Bilateral upper extremity supported Sitting balance-[name] Scale: Poor   Postural control: Posterior lean                                   Pertinent Vitals/Pain Pain Assessment: Faces Faces Pain Scale: Hurts little more Pain Location: back Pain Descriptors / Indicators: Grimacing Pain Intervention(s): Monitored during session    Home Living Family/patient expects to be discharged to:: Private residence Living Arrangements: Spouse/significant other Available Help at Discharge: Family;Available 24 hours/day Type of Home: House Home Access: Stairs to enter   [employer] of Steps: 4 Home Layout: Two level;Able to live on main level with bedroom/bathroom Home Equipment: Dan Humphreys - 2 wheels;Cane - single point      Prior Function Level of Independence: Independent         Comments: refused to use any device for ambulation, pt was driving, retired Investment banker, operational, wife describes pt as "stubborn"     Hand Dominance   Dominant Hand: (primarily using L hand, but reports being ambidextrous)    Extremity/Trunk Assessment   Upper Extremity Assessment Upper Extremity Assessment: Defer to OT evaluation RUE Deficits / Details: ataxia RUE Coordination: decreased fine motor;decreased gross motor LUE Deficits / Details: ataxia LUE Coordination: decreased fine motor;decreased gross motor    Lower  Extremity Assessment Lower Extremity Assessment: RLE deficits/detail;LLE deficits/detail RLE Deficits / Details: AAROM WFL, strength grossly assessed hip 3+/5, knee flex 3+/5 knee ext 4/5, ankle 3+/5 RLE Coordination: decreased fine motor;decreased gross motor LLE Deficits / Details: increased edema from DVT, DVT with IV filter placement, increased edema limiting AAROM, grossly assessed strength hip 3+/5, knee 2+/5, ankle 2+/5 LLE Coordination: decreased  fine motor;decreased gross motor    Cervical / Trunk Assessment Cervical / Trunk Assessment: Other exceptions(s/p ACDF, weakness, ataxia)  Communication   Communication: Expressive difficulties(slurred speech)  Cognition Arousal/Alertness: Awake/alert Behavior During Therapy: Impulsive Overall Cognitive Status: Impaired/Different from baseline Area of Impairment: Orientation;Attention;Memory;Following commands;Safety/judgement;Awareness;Problem solving                 Orientation Level: Disoriented to;Time(new date) Current Attention Level: Focused Memory: Decreased short-term memory Following Commands: Follows one step commands with increased time(and mulitmodal cues) Safety/Judgement: Decreased awareness of safety;Decreased awareness of deficits Awareness: Intellectual Problem Solving: Slow processing;Decreased initiation;Difficulty sequencing;Requires verbal cues;Requires tactile cues General Comments: pt realizing he should not attempt standing once he was seated at EOB      General Comments General comments (skin integrity, edema, etc.): c/o of dizziness on coming into sitting, unable to get good BP reading in sitting secondary to ataxia, however symptoms quickly resolved         Assessment/Plan    PT Assessment Patient needs continued PT services  PT Problem List Decreased strength;Decreased range of motion;Decreased mobility;Decreased balance;Decreased coordination;Decreased cognition;Decreased knowledge of use of DME;Decreased safety awareness       PT Treatment Interventions DME instruction;Gait training;Functional mobility training;Therapeutic activities;Therapeutic exercise;Balance training;Cognitive remediation;Patient/family education;Wheelchair mobility training    PT Goals (Current goals can be found in the Care Plan section)  Acute Rehab PT Goals Patient Stated Goal: to walk PT Goal Formulation: With patient/family Time For Goal Achievement:  03/15/18 Potential to Achieve Goals: Fair    Frequency Min 3X/week   Barriers to discharge        Co-evaluation PT/OT/SLP Co-Evaluation/Treatment: Yes Reason for Co-Treatment: For patient/therapist safety PT goals addressed during session: Mobility/safety with mobility OT goals addressed during session: ADL's and self-care       AM-PAC PT "6 Clicks" Daily Activity  Outcome Measure Difficulty turning over in bed (including adjusting bedclothes, sheets and blankets)?: Unable Difficulty moving from lying on back to sitting on the side of the bed? : Unable Difficulty sitting down on and standing up from a chair with arms (e.g., wheelchair, bedside commode, etc,.)?: Unable Help needed moving to and from a bed to chair (including a wheelchair)?: Total Help needed walking in hospital room?: Total Help needed climbing 3-5 steps with a railing? : Total 6 Click Score: 6    End of Session Equipment Utilized During Treatment: Gait belt Activity Tolerance: Patient tolerated treatment well Patient left: in chair;with call bell/phone within reach;with chair alarm set;with family/visitor present;with nursing/sitter in room Nurse Communication: Mobility status PT Visit Diagnosis: Unsteadiness on feet (R26.81);Other abnormalities of gait and mobility (R26.89);Repeated falls (R29.6);Muscle weakness (generalized) (M62.81);History of falling (Z91.81);Difficulty in walking, not elsewhere classified (R26.2);Other symptoms and signs involving the nervous system (Z61.096(R29.898)    Time: [id number] PT Time Calculation (min) (ACUTE ONLY): 47 min   Charges:   PT Evaluation $PT Eval Moderate Complexity: 1 Mod          Cornesha Radziewicz B. [name] PT, DPT Acute Rehabilitation Services Pager [phone] Office [phone]   [name] 03/01/2018, 10:59 AM

## 2018-03-01 NOTE — Consult Note (Signed)
Physical Medicine and Rehabilitation Consult Reason for Consult: Decreased functional mobility Referring Physician: [facility]   HPI: Wayne Buckley is a 67 y.o. right-handed male with history of alcohol use, hypertension, TBI 6 years ago with craniotomy and received inpatient rehab services November 2013.  Per chart review patient lives with spouse.  Reportedly independent has refused assistive devices.  He was driving short distances.  Two-level home with bedroom on [address].  Presented 02/25/2018 after fall x2 with noted lower extremity weakness.  Cranial CT scan showed no acute changes.  Noted chronic encephalomalacia.  Remote left frontal craniotomy as well as remote right occipital skull fracture.  Noted vascular studies lower extremity positive for acute DVT left common femoral vein, femoral vein, profunda vein, popliteal vein peroneal vein posterior tibial veins gastrocnemius vein.  Left external iliac vein also thrombosed.  Underwent placement of IVC filter 02/27/2018 per Dr. Myra Gianotti.  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  Underwent ACDF and fusion at C6-7 02/28/2018 per Dr. Wynetta Emery.  [facility] course pain management.  Decadron protocol as indicated.  Tolerating a soft diet.  Therapy evaluation completed with recommendations of physical medicine rehab consult.   Review of Systems  Constitutional: Negative for chills and fever.  HENT: Negative for hearing loss.   Eyes: Negative for blurred vision and double vision.  Respiratory: Negative for cough and shortness of breath.   Cardiovascular: Negative for chest pain, palpitations and leg swelling.  Gastrointestinal: Positive for constipation. Negative for nausea and vomiting.  Genitourinary: Negative for dysuria, flank pain and hematuria.  Musculoskeletal: Positive for back pain, falls and myalgias.  Skin: Negative for rash.  Neurological: Positive for tingling, speech change and headaches.  All other systems reviewed and are  negative.  Past Medical History:  Diagnosis Date  . Hypertension   . TBI (traumatic brain injury) (HCC) 2013   Past Surgical History:  Procedure Laterality Date  . ANTERIOR CERVICAL DECOMP/DISCECTOMY FUSION N/A 02/27/2018   Procedure: ANTERIOR CERVICAL DECOMPRESSION/DISCECTOMY FUSION C6-7;  Surgeon: Donalee Citrin, MD;  Location: [facility] OR;  Service: Neurosurgery;  Laterality: N/A;  . CRANIOTOMY  04/28/2012   Procedure: CRANIOTOMY HEMATOMA EVACUATION SUBDURAL;  Surgeon: Reinaldo Meeker, MD;  Location: [facility] OR;  Service: Neurosurgery;  Laterality: Left;  Left Craniotomy for Subdural Hematoma  . RADIOLOGY WITH ANESTHESIA N/A 02/27/2018   Procedure: MRI WITH ANESTHESIA;  Surgeon: Radiologist, Medication, MD;  Location: [facility] OR;  Service: Radiology;  Laterality: N/A;  . VENA CAVA FILTER PLACEMENT Right 02/27/2018   Procedure: INSERTION VENA-CAVA FILTER;  Surgeon: Nada Libman, MD;  Location: [facility] OR;  Service: Vascular;  Laterality: Right;   Family History  Problem Relation Age of Onset  . Heart disease Father    Social History:  reports that he has never smoked. He has never used smokeless tobacco. He reports that he drinks alcohol. His drug history is not on file. Allergies: No Known Allergies Medications Prior to Admission  Medication Sig Dispense Refill  . Ascorbic Acid (VITAMIN C) 1000 MG tablet Take 1,000 mg by mouth daily.     Marland Kitchen aspirin EC 325 MG tablet Take 325 mg by mouth 2 (two) times daily.    . Multiple Vitamin (MULTIVITAMIN WITH MINERALS) TABS Take 1 tablet by mouth daily.    . feeding supplement (ENSURE COMPLETE) LIQD Take 237 mLs by mouth 2 (two) times daily between meals. (Patient not taking: Reported on 02/26/2018)    . folic acid (FOLVITE) 1 MG tablet Take 1  tablet (1 mg total) by mouth daily. (Patient not taking: Reported on 02/26/2018) 30 tablet 1  . pantoprazole (PROTONIX) 40 MG tablet Take 1 tablet (40 mg total) by mouth daily at 12 noon. (Patient not taking: Reported on 02/26/2018)  30 tablet 1  . thiamine 250 MG tablet Take 1 tablet (250 mg total) by mouth daily. (Patient not taking: Reported on 02/26/2018) 30 tablet 1    Home: Home Living Family/patient expects to be discharged to:: Private residence Living Arrangements: Spouse/significant other Available Help at Discharge: Family, Available 24 hours/day Type of Home: House Home Access: Stairs to enter [employer] of Steps: 4 Home Layout: Two level, Able to live on main level with bedroom/bathroom Bathroom Shower/Tub: Health visitor: Standard Home Equipment: Environmental consultant - 2 wheels, Cane - single point  Functional History: Prior Function Level of Independence: Independent Comments: refused to use any device for ambulation, pt was driving, retired Investment banker, operational, wife describes pt as "stubborn" Functional Status:  Mobility: Bed Mobility Overal bed mobility: Needs Assistance Bed Mobility: Supine to Sit Supine to sit: +2 for physical assistance, Max assist General bed mobility comments: step by step verbal cues and assist for LEs extremities to EOB, to raise trunk and position hips at EOB with pad Transfers Overall transfer level: Needs assistance Transfer via Lift Equipment: [place]      ADL: ADL Overall ADL's : Needs assistance/impaired Eating/Feeding: Supervision/ safety, Set up, Sitting Grooming: Moderate assistance, Sitting Upper Body Bathing: Maximal assistance, Sitting Lower Body Bathing: Total assistance, Bed level Upper Body Dressing : Maximal assistance, Sitting Lower Body Dressing: Total assistance, Bed level Functional mobility during ADLs: (requires lift)  Cognition: Cognition Overall Cognitive Status: Impaired/Different from baseline Orientation Level: Oriented X4 Cognition Arousal/Alertness: Awake/alert Behavior During Therapy: Impulsive Overall Cognitive Status: Impaired/Different from baseline Area of Impairment: Orientation, Attention, Memory, Following commands,  Safety/judgement, Awareness, Problem solving Orientation Level: Disoriented to, Time(new date) Current Attention Level: Focused Memory: Decreased short-term memory Following Commands: Follows one step commands with increased time(and mulitmodal cues) Safety/Judgement: Decreased awareness of safety, Decreased awareness of deficits Awareness: Intellectual Problem Solving: Slow processing, Decreased initiation, Difficulty sequencing, Requires verbal cues, Requires tactile cues General Comments: pt realizing he should not attempt standing once he was seated at EOB  Blood pressure (!) 173/94, pulse (!) 103, temperature 98.3 F (36.8 C), temperature source Oral, resp. rate 16, height 5' 10.98" (1.803 m), weight 96.9 kg, SpO2 98 %. Physical Exam  Vitals reviewed. Constitutional: He appears well-developed. No distress.  HENT:  Head: Normocephalic.  Eyes: EOM are normal.  Neck: Normal range of motion. Neck supple.  ACDF site clean and dry  Cardiovascular: Normal rate and regular rhythm.  Respiratory: Effort normal and breath sounds normal. No respiratory distress.  GI: Soft. Bowel sounds are normal. He exhibits no distension.  Neurological: He is alert.  Patient is very talkative and at times difficult to redirect.  He does provide his name and age.  Follows simple commands.  Speech is dysarthric but intelligible    Results for orders placed or performed during the hospital encounter of 02/25/18 (from the past 24 hour(s))  Urinalysis, Routine w reflex microscopic     Status: Abnormal   Collection Time: 02/28/18 10:20 PM  Result Value Ref Range   Color, Urine YELLOW YELLOW   APPearance HAZY (A) CLEAR   Specific Gravity, Urine 1.011 1.005 - 1.030   pH 5.0 5.0 - 8.0   Glucose, UA NEGATIVE NEGATIVE mg/dL   Hgb urine dipstick NEGATIVE NEGATIVE  Bilirubin Urine NEGATIVE NEGATIVE   Ketones, ur NEGATIVE NEGATIVE mg/dL   Protein, ur NEGATIVE NEGATIVE mg/dL   Nitrite NEGATIVE NEGATIVE    Leukocytes, UA SMALL (A) NEGATIVE   RBC / HPF 0-5 0 - 5 RBC/hpf   WBC, UA 6-10 0 - 5 WBC/hpf   Bacteria, UA NONE SEEN NONE SEEN   Mucus PRESENT    Hyaline Casts, UA PRESENT   Sodium, urine, random     Status: None   Collection Time: 02/28/18 10:20 PM  Result Value Ref Range   Sodium, Ur 71 mmol/L  Basic metabolic panel     Status: Abnormal   Collection Time: 03/01/18  2:40 AM  Result Value Ref Range   Sodium 137 135 - 145 mmol/L   Potassium 3.8 3.5 - 5.1 mmol/L   Chloride 105 98 - 111 mmol/L   CO2 23 22 - 32 mmol/L   Glucose, Bld 154 (H) 70 - 99 mg/dL   BUN 21 8 - 23 mg/dL   Creatinine, Ser 1.61 0.61 - 1.24 mg/dL   Calcium 8.2 (L) 8.9 - 10.3 mg/dL   GFR calc non Af Amer >60 >60 mL/min   GFR calc Af Amer >60 >60 mL/min   Anion gap 9 5 - 15   Dg Cervical Spine 2 Or 3 Views  Result Date: 02/28/2018 CLINICAL DATA:  Cervical decompression and fusion at C6-7 EXAM: DG C-ARM 61-120 MIN; CERVICAL SPINE - 2-3 VIEW COMPARISON:  None. FLUOROSCOPY TIME:  Radiation Exposure Index (as provided by the fluoroscopic device): Not available If the device does not provide the exposure index: Fluoroscopy Time:  18 seconds Number of Acquired Images:  2 FINDINGS: Initial lateral images demonstrate diffuse degenerative changes of the cervical spine. The inferior aspect of the cervical spine is not well visualized due to overlying shoulder artifact. Second image demonstrates evidence of fusion at C6-7 with anterior fixation. IMPRESSION: C6-7 fusion. Electronically Signed   By: Alcide Clever M.D.   On: 02/28/2018 09:52   Mr Cervical Spine Wo Contrast  Result Date: 02/27/2018 CLINICAL DATA:  Initial evaluation for generalized muscle weakness status post recent fall on 02/21/2018. EXAM: MRI CERVICAL, THORACIC AND LUMBAR SPINE WITHOUT CONTRAST TECHNIQUE: Multiplanar and multiecho pulse sequences of the cervical spine, to include the craniocervical junction and cervicothoracic junction, and thoracic and lumbar spine,  were obtained without intravenous contrast. COMPARISON:  Prior CT from 02/25/2018 FINDINGS: MRI CERVICAL SPINE FINDINGS Alignment: Straightening with smooth reversal of the normal cervical lordosis. Trace anterolisthesis of C2 on C3, stable. Vertebrae: Vertebral body heights maintained without evidence for acute or chronic fracture. Heterogeneous bone marrow signal intensity seen at C3-4 through T1-2 due to underlying degenerative spondylolysis. No discrete or worrisome osseous lesions. Cord: Patchy abnormal T2 signal abnormality seen within the cervical spinal cord at the level of C6-7, concerning for possible cord contusion/edema given provided history. No associated hemorrhage. Signal intensity within the cervical spinal cord otherwise normal. Posterior Fossa, vertebral arteries, paraspinal tissues: Visualized brain demonstrates no acute abnormality. Retrocerebellar cystic space could reflect mega cisterna magna versus arachnoid cyst. Craniocervical junction normal. Soft tissue edema adjacent to the right C6-7 facet, suspected to be posttraumatic in nature given adjacent cord injury (series 5, image 2). No definite discrete fracture line about the adjacent facet. Additionally, mild soft tissue edema within the posterior soft tissues adjacent to the spinous processes of C5 through C7, also likely posttraumatic (series 5, image 8). Patient is intubated with fluid in the nasopharynx. Abnormal flow void within the  left vertebral artery, likely occluded. Finding is age indeterminate. Normal flow void within the dominant right vertebral artery. Disc levels: C2-C3: Trace anterolisthesis. Shallow central disc protrusion mildly indents the ventral thecal sac. Prominent left-sided facet degeneration. Resultant moderate left C3 foraminal stenosis. No significant canal narrowing. Right neural foraminal on the patent. C3-C4: Diffuse degenerative disc osteophyte with intervertebral disc space narrowing. Broad posterior  component, asymmetric to the left flattens and partially faces the ventral thecal sac with resultant mild spinal stenosis. No significant cord deformity. Superimposed bilateral facet hypertrophy. Resultant severe left with moderate right C4 foraminal narrowing. C4-C5: Diffuse degenerative disc osteophyte with intervertebral disc space narrowing with bilateral facet hypertrophy. Flattening of the ventral thecal sac with resultant mild spinal stenosis. Severe bilateral C5 foraminal narrowing. C5-C6: Diffuse disc osteophyte with intervertebral disc space narrowing. Broad posterior component indents and partially faces the ventral CSF. Resultant mild to moderate spinal stenosis without cord deformity. Bilateral facet hypertrophy. Resultant moderate bilateral C6 foraminal narrowing. C6-C7: Diffuse degenerative disc osteophyte with intervertebral disc space narrowing and bilateral uncovertebral hypertrophy. Broad posterior disc osteophyte complex (mostly disc) effaces the ventral thecal sac, impinging upon the cervical spinal cord. Superimposed facet and ligament flavum hypertrophy. Resultant severe spinal stenosis with cord flattening. Thecal sac measures approximately 4 mm in AP diameter at its most narrow point. Associated cord signal abnormality, likely contusion/edema. Severe bilateral C7 foraminal stenosis. C7-T1: Diffuse degenerative disc osteophyte with intervertebral disc space narrowing and left greater than right uncovertebral spurring. No significant spinal stenosis. Severe bilateral C8 foraminal stenosis. MRI THORACIC SPINE FINDINGS Alignment: Vertebral bodies normally aligned with preservation of the normal thoracic kyphosis. No listhesis. Vertebrae: Vertebral body heights maintained without evidence for acute or chronic fracture. Bone marrow signal intensity within normal limits. Benign hemangioma noted within the T8 vertebral body. No other discrete or worrisome osseous lesions. No abnormal marrow edema.  Cord: Signal intensity within the thoracic spinal cord is normal. No evidence for traumatic cord injury. Paraspinal and other soft tissues: Paraspinous soft tissues demonstrate no acute finding. Patchy opacity within the partially visualized posterior right lower lobe, which could reflect atelectasis or infiltrate. Partially visualized visceral structures otherwise grossly unremarkable. Disc levels: T1-2: Mild diffuse disc bulge with bilateral facet hypertrophy. No significant spinal stenosis. Mild to moderate bilateral foraminal narrowing. T7-8: Tiny central disc protrusion mildly indents the ventral thecal sac. No significant stenosis. T8-9: Central disc protrusion indents the ventral thecal sac. Minimal flattening of the ventral cord without significant stenosis. T10-11: Minimal disc bulge. Bilateral facet hypertrophy. No spinal stenosis. Mild left neural foraminal narrowing. T11-12: Mild disc bulge. Bilateral facet hypertrophy. No significant spinal stenosis. Moderate left with mild right neural foraminal narrowing. T12-L1: Diffuse disc bulge. Left-sided facet hypertrophy. No significant spinal stenosis. Mild left neural foraminal narrowing. MRI LUMBAR SPINE FINDINGS Segmentation: Normal segmentation. Lowest well-formed disc labeled the L5-S1 level. Alignment: Levoscoliosis. Alignment otherwise normal with preservation of the normal lumbar lordosis. No listhesis. Vertebrae: Vertebral body heights maintained without evidence for acute or chronic fracture. There is an acute nondisplaced fracture extending through the S2 segment (series 18, image 8). No other definite fracture identified. Underlying bone marrow signal intensity heterogeneous but within normal limits. No worrisome osseous lesions. Conus medullaris and cauda equina: Conus extends to the T12 level. Conus and cauda equina appear normal. Paraspinal and other soft tissues: Visualized paraspinous soft tissues demonstrate no acute finding. Few scattered  renal cysts noted bilaterally. Visualized visceral structures otherwise unremarkable. Disc levels: L1-2: Diffuse disc bulge with disc desiccation  and intervertebral disc space narrowing. Right-sided reactive endplate changes. Mild facet hypertrophy. No canal stenosis. Foramina remain patent. L2-3: Chronic intervertebral disc space narrowing with diffuse disc bulge and disc desiccation. Mild facet hypertrophy. Resultant mild spinal stenosis. Mild right L2 foraminal narrowing. L3-4: Chronic intervertebral disc space narrowing with diffuse disc bulge and disc desiccation. Disc bulging asymmetric to the right with right-sided reactive endplate changes. Moderate facet and ligament flavum hypertrophy. Resultant mild canal with bilateral lateral recess stenosis. Mild to moderate bilateral L3 foraminal narrowing. L4-5: Chronic intervertebral disc space narrowing with diffuse disc bulge and disc desiccation. Moderate facet ligament flavum hypertrophy. Resultant moderate canal with bilateral subarticular stenosis. Moderate left with mild to moderate right L4 foraminal narrowing. L5-S1: Shallow posterior disc bulge. Severe bilateral facet arthrosis. No made of a small 5 mm synovial cyst at the anteromedial aspect of the right L5-S1 facet. Resultant mild canal with moderate bilateral lateral recess narrowing. Foramina remain patent. IMPRESSION: MRI CERVICAL SPINE IMPRESSION: 1. Degenerative disc osteophyte with facet hypertrophy at C6-7 with resultant severe spinal stenosis and secondary cord compression. Abnormal cord signal intensity at this level most likely reflects contusion/edema related to recent trauma/fall. Neurosurgical consultation recommended. 2. Soft tissue edema adjacent to the right C6-7 facet as well as the C5 through C7 spinous processes, likely posttraumatic in nature given the adjacent cord injury. No definite discrete fracture line identified. 3. 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 Associated multilevel moderate to severe foraminal narrowing as detailed above. MRI THORACIC SPINE IMPRESSION: 1. No acute traumatic injury within the thoracic spine. 2. Multilevel degenerative spondylolysis with small disc protrusions at T7-8 and T8-9. No significant spinal stenosis. MRI LUMBAR SPINE IMPRESSION: 1. Acute nondisplaced sacral fracture, S2 segment. 2. 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 Resultant mild to moderate diffuse spinal stenosis, most pronounced at the L4-5 level. 3. Multifactorial degenerative changes with resultant multilevel foraminal narrowing as above. Notable findings include mild right L2, with mild to moderate bilateral L3 and L4 foraminal stenosis. Findings were discussed by telephone with Dr. Randol Kern at 2:45 p.m. on 02/27/2018. Electronically Signed   By: Rise Mu M.D.   On: 02/27/2018 14:46   Mr Thoracic Spine Wo Contrast  Result Date: 02/27/2018 CLINICAL DATA:  Initial evaluation for generalized muscle weakness status post recent fall on 02/21/2018. EXAM: MRI CERVICAL, THORACIC AND LUMBAR SPINE WITHOUT CONTRAST TECHNIQUE: Multiplanar and multiecho pulse sequences of the cervical spine, to include the craniocervical junction and cervicothoracic junction, and thoracic and lumbar spine, were obtained without intravenous contrast. COMPARISON:  Prior CT from 02/25/2018 FINDINGS: MRI CERVICAL SPINE FINDINGS Alignment: Straightening with smooth reversal of the normal cervical lordosis. Trace anterolisthesis of C2 on C3, stable. Vertebrae: Vertebral body heights maintained without evidence for acute or chronic fracture. Heterogeneous bone marrow signal intensity seen at C3-4 through T1-2 due to underlying degenerative spondylolysis. No discrete or worrisome osseous lesions. Cord: Patchy abnormal T2 signal abnormality seen within the cervical spinal cord at the level of  C6-7, concerning for possible cord contusion/edema given provided history. No associated hemorrhage. Signal intensity within the cervical spinal cord otherwise normal. Posterior Fossa, vertebral arteries, paraspinal tissues: Visualized brain demonstrates no acute abnormality. Retrocerebellar cystic space could reflect mega cisterna magna versus arachnoid cyst. Craniocervical junction normal. Soft tissue edema adjacent to the right C6-7 facet, suspected to be posttraumatic in nature given adjacent cord injury (series 5, image 2). No definite discrete fracture line about the adjacent facet. Additionally,  mild soft tissue edema within the posterior soft tissues adjacent to the spinous processes of C5 through C7, also likely posttraumatic (series 5, image 8). Patient is intubated with fluid in the nasopharynx. Abnormal flow void within the left vertebral artery, likely occluded. Finding is age indeterminate. Normal flow void within the dominant right vertebral artery. Disc levels: C2-C3: Trace anterolisthesis. Shallow central disc protrusion mildly indents the ventral thecal sac. Prominent left-sided facet degeneration. Resultant moderate left C3 foraminal stenosis. No significant canal narrowing. Right neural foraminal on the patent. C3-C4: Diffuse degenerative disc osteophyte with intervertebral disc space narrowing. Broad posterior component, asymmetric to the left flattens and partially faces the ventral thecal sac with resultant mild spinal stenosis. No significant cord deformity. Superimposed bilateral facet hypertrophy. Resultant severe left with moderate right C4 foraminal narrowing. C4-C5: Diffuse degenerative disc osteophyte with intervertebral disc space narrowing with bilateral facet hypertrophy. Flattening of the ventral thecal sac with resultant mild spinal stenosis. Severe bilateral C5 foraminal narrowing. C5-C6: Diffuse disc osteophyte with intervertebral disc space narrowing. Broad posterior component  indents and partially faces the ventral CSF. Resultant mild to moderate spinal stenosis without cord deformity. Bilateral facet hypertrophy. Resultant moderate bilateral C6 foraminal narrowing. C6-C7: Diffuse degenerative disc osteophyte with intervertebral disc space narrowing and bilateral uncovertebral hypertrophy. Broad posterior disc osteophyte complex (mostly disc) effaces the ventral thecal sac, impinging upon the cervical spinal cord. Superimposed facet and ligament flavum hypertrophy. Resultant severe spinal stenosis with cord flattening. Thecal sac measures approximately 4 mm in AP diameter at its most narrow point. Associated cord signal abnormality, likely contusion/edema. Severe bilateral C7 foraminal stenosis. C7-T1: Diffuse degenerative disc osteophyte with intervertebral disc space narrowing and left greater than right uncovertebral spurring. No significant spinal stenosis. Severe bilateral C8 foraminal stenosis. MRI THORACIC SPINE FINDINGS Alignment: Vertebral bodies normally aligned with preservation of the normal thoracic kyphosis. No listhesis. Vertebrae: Vertebral body heights maintained without evidence for acute or chronic fracture. Bone marrow signal intensity within normal limits. Benign hemangioma noted within the T8 vertebral body. No other discrete or worrisome osseous lesions. No abnormal marrow edema. Cord: Signal intensity within the thoracic spinal cord is normal. No evidence for traumatic cord injury. Paraspinal and other soft tissues: Paraspinous soft tissues demonstrate no acute finding. Patchy opacity within the partially visualized posterior right lower lobe, which could reflect atelectasis or infiltrate. Partially visualized visceral structures otherwise grossly unremarkable. Disc levels: T1-2: Mild diffuse disc bulge with bilateral facet hypertrophy. No significant spinal stenosis. Mild to moderate bilateral foraminal narrowing. T7-8: Tiny central disc protrusion mildly indents  the ventral thecal sac. No significant stenosis. T8-9: Central disc protrusion indents the ventral thecal sac. Minimal flattening of the ventral cord without significant stenosis. T10-11: Minimal disc bulge. Bilateral facet hypertrophy. No spinal stenosis. Mild left neural foraminal narrowing. T11-12: Mild disc bulge. Bilateral facet hypertrophy. No significant spinal stenosis. Moderate left with mild right neural foraminal narrowing. T12-L1: Diffuse disc bulge. Left-sided facet hypertrophy. No significant spinal stenosis. Mild left neural foraminal narrowing. MRI LUMBAR SPINE FINDINGS Segmentation: Normal segmentation. Lowest well-formed disc labeled the L5-S1 level. Alignment: Levoscoliosis. Alignment otherwise normal with preservation of the normal lumbar lordosis. No listhesis. Vertebrae: Vertebral body heights maintained without evidence for acute or chronic fracture. There is an acute nondisplaced fracture extending through the S2 segment (series 18, image 8). No other definite fracture identified. Underlying bone marrow signal intensity heterogeneous but within normal limits. No worrisome osseous lesions. Conus medullaris and cauda equina: Conus extends to the T12 level. Conus and  cauda equina appear normal. Paraspinal and other soft tissues: Visualized paraspinous soft tissues demonstrate no acute finding. Few scattered renal cysts noted bilaterally. Visualized visceral structures otherwise unremarkable. Disc levels: L1-2: Diffuse disc bulge with disc desiccation and intervertebral disc space narrowing. Right-sided reactive endplate changes. Mild facet hypertrophy. No canal stenosis. Foramina remain patent. L2-3: Chronic intervertebral disc space narrowing with diffuse disc bulge and disc desiccation. Mild facet hypertrophy. Resultant mild spinal stenosis. Mild right L2 foraminal narrowing. L3-4: Chronic intervertebral disc space narrowing with diffuse disc bulge and disc desiccation. Disc bulging asymmetric  to the right with right-sided reactive endplate changes. Moderate facet and ligament flavum hypertrophy. Resultant mild canal with bilateral lateral recess stenosis. Mild to moderate bilateral L3 foraminal narrowing. L4-5: Chronic intervertebral disc space narrowing with diffuse disc bulge and disc desiccation. Moderate facet ligament flavum hypertrophy. Resultant moderate canal with bilateral subarticular stenosis. Moderate left with mild to moderate right L4 foraminal narrowing. L5-S1: Shallow posterior disc bulge. Severe bilateral facet arthrosis. No made of a small 5 mm synovial cyst at the anteromedial aspect of the right L5-S1 facet. Resultant mild canal with moderate bilateral lateral recess narrowing. Foramina remain patent. IMPRESSION: MRI CERVICAL SPINE IMPRESSION: 1. Degenerative disc osteophyte with facet hypertrophy at C6-7 with resultant severe spinal stenosis and secondary cord compression. Abnormal cord signal intensity at this level most likely reflects contusion/edema related to recent trauma/fall. Neurosurgical consultation recommended. 2. Soft tissue edema adjacent to the right C6-7 facet as well as the C5 through C7 spinous processes, likely posttraumatic in nature given the adjacent cord injury. No definite discrete fracture line identified. 3. 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 Associated multilevel moderate to severe foraminal narrowing as detailed above. MRI THORACIC SPINE IMPRESSION: 1. No acute traumatic injury within the thoracic spine. 2. Multilevel degenerative spondylolysis with small disc protrusions at T7-8 and T8-9. No significant spinal stenosis. MRI LUMBAR SPINE IMPRESSION: 1. Acute nondisplaced sacral fracture, S2 segment. 2. 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 Resultant mild to moderate diffuse spinal stenosis, most pronounced at the L4-5 level. 3. Multifactorial  degenerative changes with resultant multilevel foraminal narrowing as above. Notable findings include mild right L2, with mild to moderate bilateral L3 and L4 foraminal stenosis. Findings were discussed by telephone with Dr. Randol Kern at 2:45 p.m. on 02/27/2018. Electronically Signed   By: Rise Mu M.D.   On: 02/27/2018 14:46   Mr Lumbar Spine Wo Contrast  Result Date: 02/27/2018 CLINICAL DATA:  Initial evaluation for generalized muscle weakness status post recent fall on 02/21/2018. EXAM: MRI CERVICAL, THORACIC AND LUMBAR SPINE WITHOUT CONTRAST TECHNIQUE: Multiplanar and multiecho pulse sequences of the cervical spine, to include the craniocervical junction and cervicothoracic junction, and thoracic and lumbar spine, were obtained without intravenous contrast. COMPARISON:  Prior CT from 02/25/2018 FINDINGS: MRI CERVICAL SPINE FINDINGS Alignment: Straightening with smooth reversal of the normal cervical lordosis. Trace anterolisthesis of C2 on C3, stable. Vertebrae: Vertebral body heights maintained without evidence for acute or chronic fracture. Heterogeneous bone marrow signal intensity seen at C3-4 through T1-2 due to underlying degenerative spondylolysis. No discrete or worrisome osseous lesions. Cord: Patchy abnormal T2 signal abnormality seen within the cervical spinal cord at the level of C6-7, concerning for possible cord contusion/edema given provided history. No associated hemorrhage. Signal intensity within the cervical spinal cord otherwise normal. Posterior Fossa, vertebral arteries, paraspinal tissues: Visualized brain demonstrates no acute abnormality. Retrocerebellar cystic space could reflect mega cisterna magna versus arachnoid  cyst. Craniocervical junction normal. Soft tissue edema adjacent to the right C6-7 facet, suspected to be posttraumatic in nature given adjacent cord injury (series 5, image 2). No definite discrete fracture line about the adjacent facet. Additionally, mild  soft tissue edema within the posterior soft tissues adjacent to the spinous processes of C5 through C7, also likely posttraumatic (series 5, image 8). Patient is intubated with fluid in the nasopharynx. Abnormal flow void within the left vertebral artery, likely occluded. Finding is age indeterminate. Normal flow void within the dominant right vertebral artery. Disc levels: C2-C3: Trace anterolisthesis. Shallow central disc protrusion mildly indents the ventral thecal sac. Prominent left-sided facet degeneration. Resultant moderate left C3 foraminal stenosis. No significant canal narrowing. Right neural foraminal on the patent. C3-C4: Diffuse degenerative disc osteophyte with intervertebral disc space narrowing. Broad posterior component, asymmetric to the left flattens and partially faces the ventral thecal sac with resultant mild spinal stenosis. No significant cord deformity. Superimposed bilateral facet hypertrophy. Resultant severe left with moderate right C4 foraminal narrowing. C4-C5: Diffuse degenerative disc osteophyte with intervertebral disc space narrowing with bilateral facet hypertrophy. Flattening of the ventral thecal sac with resultant mild spinal stenosis. Severe bilateral C5 foraminal narrowing. C5-C6: Diffuse disc osteophyte with intervertebral disc space narrowing. Broad posterior component indents and partially faces the ventral CSF. Resultant mild to moderate spinal stenosis without cord deformity. Bilateral facet hypertrophy. Resultant moderate bilateral C6 foraminal narrowing. C6-C7: Diffuse degenerative disc osteophyte with intervertebral disc space narrowing and bilateral uncovertebral hypertrophy. Broad posterior disc osteophyte complex (mostly disc) effaces the ventral thecal sac, impinging upon the cervical spinal cord. Superimposed facet and ligament flavum hypertrophy. Resultant severe spinal stenosis with cord flattening. Thecal sac measures approximately 4 mm in AP diameter at its  most narrow point. Associated cord signal abnormality, likely contusion/edema. Severe bilateral C7 foraminal stenosis. C7-T1: Diffuse degenerative disc osteophyte with intervertebral disc space narrowing and left greater than right uncovertebral spurring. No significant spinal stenosis. Severe bilateral C8 foraminal stenosis. MRI THORACIC SPINE FINDINGS Alignment: Vertebral bodies normally aligned with preservation of the normal thoracic kyphosis. No listhesis. Vertebrae: Vertebral body heights maintained without evidence for acute or chronic fracture. Bone marrow signal intensity within normal limits. Benign hemangioma noted within the T8 vertebral body. No other discrete or worrisome osseous lesions. No abnormal marrow edema. Cord: Signal intensity within the thoracic spinal cord is normal. No evidence for traumatic cord injury. Paraspinal and other soft tissues: Paraspinous soft tissues demonstrate no acute finding. Patchy opacity within the partially visualized posterior right lower lobe, which could reflect atelectasis or infiltrate. Partially visualized visceral structures otherwise grossly unremarkable. Disc levels: T1-2: Mild diffuse disc bulge with bilateral facet hypertrophy. No significant spinal stenosis. Mild to moderate bilateral foraminal narrowing. T7-8: Tiny central disc protrusion mildly indents the ventral thecal sac. No significant stenosis. T8-9: Central disc protrusion indents the ventral thecal sac. Minimal flattening of the ventral cord without significant stenosis. T10-11: Minimal disc bulge. Bilateral facet hypertrophy. No spinal stenosis. Mild left neural foraminal narrowing. T11-12: Mild disc bulge. Bilateral facet hypertrophy. No significant spinal stenosis. Moderate left with mild right neural foraminal narrowing. T12-L1: Diffuse disc bulge. Left-sided facet hypertrophy. No significant spinal stenosis. Mild left neural foraminal narrowing. MRI LUMBAR SPINE FINDINGS Segmentation: Normal  segmentation. Lowest well-formed disc labeled the L5-S1 level. Alignment: Levoscoliosis. Alignment otherwise normal with preservation of the normal lumbar lordosis. No listhesis. Vertebrae: Vertebral body heights maintained without evidence for acute or chronic fracture. There is an acute nondisplaced fracture extending through the  S2 segment (series 18, image 8). No other definite fracture identified. Underlying bone marrow signal intensity heterogeneous but within normal limits. No worrisome osseous lesions. Conus medullaris and cauda equina: Conus extends to the T12 level. Conus and cauda equina appear normal. Paraspinal and other soft tissues: Visualized paraspinous soft tissues demonstrate no acute finding. Few scattered renal cysts noted bilaterally. Visualized visceral structures otherwise unremarkable. Disc levels: L1-2: Diffuse disc bulge with disc desiccation and intervertebral disc space narrowing. Right-sided reactive endplate changes. Mild facet hypertrophy. No canal stenosis. Foramina remain patent. L2-3: Chronic intervertebral disc space narrowing with diffuse disc bulge and disc desiccation. Mild facet hypertrophy. Resultant mild spinal stenosis. Mild right L2 foraminal narrowing. L3-4: Chronic intervertebral disc space narrowing with diffuse disc bulge and disc desiccation. Disc bulging asymmetric to the right with right-sided reactive endplate changes. Moderate facet and ligament flavum hypertrophy. Resultant mild canal with bilateral lateral recess stenosis. Mild to moderate bilateral L3 foraminal narrowing. L4-5: Chronic intervertebral disc space narrowing with diffuse disc bulge and disc desiccation. Moderate facet ligament flavum hypertrophy. Resultant moderate canal with bilateral subarticular stenosis. Moderate left with mild to moderate right L4 foraminal narrowing. L5-S1: Shallow posterior disc bulge. Severe bilateral facet arthrosis. No made of a small 5 mm synovial cyst at the  anteromedial aspect of the right L5-S1 facet. Resultant mild canal with moderate bilateral lateral recess narrowing. Foramina remain patent. IMPRESSION: MRI CERVICAL SPINE IMPRESSION: 1. Degenerative disc osteophyte with facet hypertrophy at C6-7 with resultant severe spinal stenosis and secondary cord compression. Abnormal cord signal intensity at this level most likely reflects contusion/edema related to recent trauma/fall. Neurosurgical consultation recommended. 2. Soft tissue edema adjacent to the right C6-7 facet as well as the C5 through C7 spinous processes, likely posttraumatic in nature given the adjacent cord injury. No definite discrete fracture line identified. 3. 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 Associated multilevel moderate to severe foraminal narrowing as detailed above. MRI THORACIC SPINE IMPRESSION: 1. No acute traumatic injury within the thoracic spine. 2. Multilevel degenerative spondylolysis with small disc protrusions at T7-8 and T8-9. No significant spinal stenosis. MRI LUMBAR SPINE IMPRESSION: 1. Acute nondisplaced sacral fracture, S2 segment. 2. 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 Resultant mild to moderate diffuse spinal stenosis, most pronounced at the L4-5 level. 3. Multifactorial degenerative changes with resultant multilevel foraminal narrowing as above. Notable findings include mild right L2, with mild to moderate bilateral L3 and L4 foraminal stenosis. Findings were discussed by telephone with Dr. Randol KernElgergawy at 2:45 p.m. on 02/27/2018. Electronically Signed   By: Rise MuBenjamin  McClintock M.D.   On: 02/27/2018 14:46   Dg C-arm 1-60 Min  Result Date: 02/28/2018 CLINICAL DATA:  Cervical decompression and fusion at C6-7 EXAM: DG C-ARM 61-120 MIN; CERVICAL SPINE - 2-3 VIEW COMPARISON:  None. FLUOROSCOPY TIME:  Radiation Exposure Index (as provided by the fluoroscopic  device): Not available If the device does not provide the exposure index: Fluoroscopy Time:  18 seconds Number of Acquired Images:  2 FINDINGS: Initial lateral images demonstrate diffuse degenerative changes of the cervical spine. The inferior aspect of the cervical spine is not well visualized due to overlying shoulder artifact. Second image demonstrates evidence of fusion at C6-7 with anterior fixation. IMPRESSION: C6-7 fusion. Electronically Signed   By: Alcide CleverMark  Lukens M.D.   On: 02/28/2018 09:52   Dg C-arm 1-60 Min  Result Date: 02/28/2018 CLINICAL DATA:  Cervical decompression and fusion at C6-7 EXAM: DG C-ARM 61-120  MIN; CERVICAL SPINE - 2-3 VIEW COMPARISON:  None. FLUOROSCOPY TIME:  Radiation Exposure Index (as provided by the fluoroscopic device): Not available If the device does not provide the exposure index: Fluoroscopy Time:  18 seconds Number of Acquired Images:  2 FINDINGS: Initial lateral images demonstrate diffuse degenerative changes of the cervical spine. The inferior aspect of the cervical spine is not well visualized due to overlying shoulder artifact. Second image demonstrates evidence of fusion at C6-7 with anterior fixation. IMPRESSION: C6-7 fusion. Electronically Signed   By: Alcide Clever M.D.   On: 02/28/2018 09:52     Assessment/Plan: Diagnosis: 67 yo male with hx of TBI who sustained fall and C6-7 cord contusion which required ACDF, (POD #1)  -I have personally reviewed all pertinent radiographical images, tests, and lab work which pertain to the above diagnosis.  1. Does the need for close, 24 hr/day medical supervision in concert with the patient's rehab needs make it unreasonable for this patient to be served in a less intensive setting? Yes 2. Co-Morbidities requiring supervision/potential complications: HTN, ETOH use 3. Due to bladder management, bowel management, safety, skin/wound care, disease management, medication administration, pain management and patient education,  does the patient require 24 hr/day rehab nursing? Yes 4. Does the patient require coordinated care of a physician, rehab nurse, PT (1-2 hrs/day, 5 days/week) and OT (1-2 hrs/day, 5 days/week) to address physical and functional deficits in the context of the above medical diagnosis(es)? Yes Addressing deficits in the following areas: balance, endurance, locomotion, strength, transferring, bowel/bladder control, bathing, dressing, feeding, grooming, toileting and psychosocial support 5. Can the patient actively participate in an intensive therapy program of at least 3 hrs of therapy per day at least 5 days per week? Yes 6. The potential for patient to make measurable gains while on inpatient rehab is good 7. Anticipated functional outcomes upon discharge from inpatient rehab are supervision and min assist  with PT, supervision and min assist with OT, n/a with SLP. 8. Estimated rehab length of stay to reach the above functional goals is: 20-30 days 9. Anticipated D/C setting: Home 10. Anticipated post D/C treatments: HH therapy and Outpatient therapy 11. Overall Rehab/Functional Prognosis: excellent  RECOMMENDATIONS: This patient's condition is appropriate for continued rehabilitative care in the following setting: CIR Patient has agreed to participate in recommended program. Yes Note that insurance prior authorization may be required for reimbursement for recommended care.  Comment: Rehab Admissions Coordinator to follow up.  Thanks,  Ranelle Oyster, MD, [place] Dom  I have personally performed a face to face diagnostic evaluation of this patient. Additionally, I have reviewed and concur with the physician assistant's documentation above.    Mcarthur Rossetti Angiulli, PA-C 03/01/2018

## 2018-03-02 ENCOUNTER — Encounter (HOSPITAL_COMMUNITY): Payer: Self-pay

## 2018-03-02 LAB — BASIC METABOLIC PANEL
Anion gap: 10 (ref 5–15)
BUN: 20 mg/dL (ref 8–23)
CALCIUM: 8.8 mg/dL — AB (ref 8.9–10.3)
CO2: 26 mmol/L (ref 22–32)
Chloride: 100 mmol/L (ref 98–111)
Creatinine, Ser: 0.68 mg/dL (ref 0.61–1.24)
GFR calc Af Amer: >60 mL/min (ref >60)
Glucose, Bld: 127 mg/dL — ABNORMAL HIGH (ref 70–99)
Potassium: 4.7 mmol/L (ref 3.5–5.1)
Sodium: 136 mmol/L (ref 135–145)

## 2018-03-02 LAB — CBC
HEMATOCRIT: 39.5 % (ref 39.0–52.0)
Hemoglobin: 13.2 g/dL (ref 13.0–17.0)
MCH: 29.9 pg (ref 26.0–34.0)
MCHC: 33.4 g/dL (ref 30.0–36.0)
MCV: 89.4 fL (ref 78.0–100.0)
Platelets: 378 10*3/uL (ref 150–400)
RBC: 4.42 MIL/uL (ref 4.22–5.81)
RDW: 12.9 % (ref 11.5–15.5)
WBC: 13.8 10*3/uL — ABNORMAL HIGH (ref 4.0–10.5)

## 2018-03-02 MED ORDER — METOPROLOL TARTRATE 25 MG PO TABS
25.0000 mg | ORAL_TABLET | Freq: Two times a day (BID) | ORAL | Status: DC
  Administered 2018-03-02 – 2018-03-03 (×3): 25 mg via ORAL
  Filled 2018-03-02: qty 2
  Filled 2018-03-02 (×2): qty 1
  Filled 2018-03-02: qty 2

## 2018-03-02 MED ORDER — GUAIFENESIN-DM 100-10 MG/5ML PO SYRP
5.0000 mL | ORAL_SOLUTION | ORAL | Status: DC | PRN
  Administered 2018-03-02 – 2018-03-03 (×2): 5 mL via ORAL
  Filled 2018-03-02 (×2): qty 5

## 2018-03-02 MED ORDER — PHENOL 1.4 % MT LIQD: 1.0000 | OROMUCOSAL | Status: DC | PRN

## 2018-03-02 MED ORDER — LIP MEDEX EX OINT
1.0000 "application " | TOPICAL_OINTMENT | CUTANEOUS | Status: DC | PRN
  Filled 2018-03-02: qty 7

## 2018-03-02 NOTE — Progress Notes (Signed)
Wayne Buckley [id number] Admission Data: 03/02/2018 7:06 PM Attending Provider: Tyrone NineGrunz, Ryan B, MD  WUJ:WJXBPCP:Ross, Darlen Roundharles Alan, MD Consults/ Treatment Team: Treatment Team:  Nada LibmanBrabham, Vance W, MD Donalee Citrinram, Gary, MD  Wayne Buckley is a 67 y.o. male patient admitted from 2 [place], alert  & orientated  X 3,  Full Code, VSS - Blood pressure (!) 138/97, pulse 90, temperature 98.6 F (37 C), temperature source Oral, resp. rate 18, height 5\' 11"  (1.803 m), weight 99.8 kg, SpO2 99 %., room air no c/o shortness of breath, no c/o chest pain, some congestion noted.   IV site WDL: Right hand with a transparent dsg that's clean dry and intact.  Allergies:  No Known Allergies   Past Medical History:  Diagnosis Date  . Hypertension   . TBI (traumatic brain injury) (HCC) 2013   Pt orientation to unit, room and routine.  Admission INP armband ID verified with patient/family, and in place. SR up x 2, fall risk assessment complete with Patient and family verbalizing understanding of risks associated with falls. Pt verbalizes an understanding of how to use the call bell and to call for help before getting out of bed.   Will cont to monitor and assist as needed.  Melina Modenaaroline  Nochum Fenter, RN 03/02/2018 7:06 PM

## 2018-03-02 NOTE — Progress Notes (Signed)
Per Dr. Jarvis NewcomerGrunz leave foley in place for one more day per neurology.

## 2018-03-02 NOTE — Progress Notes (Signed)
  Speech Language Pathology Treatment: Dysphagia  Patient Details Name: Wayne Buckley MRN: [id number] DOB: 10/18/1950 Today's Date: 03/02/2018 Time: [id number] SLP Time Calculation (min) (ACUTE ONLY): 9 min  Assessment / Plan / Recommendation Clinical Impression  Pt is impulsive with actions and verbalizations requiring min-mod cues to restrain from phonating during mastication or immediately after swallow. Delayed cough once during mastication of Dys 3 texture. Wife present and appropriately cueing pt for strategies. No complaints of difficulty since texture upgraded. Positioning somewhat challenging as pt leaning to left with almost choreatic like movements. Continue soft texture, thin liquids and strict adherence to swallow precautions.    HPI HPI: 67 yr old gentleman with pMHx for TBI, ETOH abuse, and most recently a series of falls.  Pt found to have a new LLE DVT, now s/p IVC filter and POD 0 s/p ACDF for C6-7 disk herniation with cord compression 11/28/17, currently on clear liquids per MD. Tenna ChildSwallow evaluation ordered. [facility]; required solid texture modifications due to oral dysphagia/pocketing but progressed to dys 3, thin liquids at time of discharge (05/28/12).       SLP Plan  Continue with current plan of care       Recommendations  Diet recommendations: Other(comment);Nectar-thick liquid(soft texture) Liquids provided via: Cup;Straw Medication Administration: Other (Comment)(if diff with dual textures whole in applesauce) Supervision: Patient able to self feed;Full supervision/cueing for compensatory strategies Compensations: Minimize environmental distractions;Slow rate;Small sips/bites;Lingual sweep for clearance of pocketing Postural Changes and/or Swallow Maneuvers: Seated upright 90 degrees                Oral Care Recommendations: Oral care BID Follow up Recommendations: Inpatient Rehab SLP Visit Diagnosis: Dysphagia,  unspecified (R13.10) Plan: Continue with current plan of care       GO                Royce MacadamiaLitaker, Wayne Buckley 03/02/2018, 1:49 PM   Breck CoonsLisa Buckley Lonell FaceLitaker M.Ed Nurse, children'sCCC-SLP Speech-Language Pathologist Pager [phone] Office [phone]

## 2018-03-02 NOTE — Progress Notes (Signed)
NURSING PROGRESS NOTE  Wayne Buckley [id number] Transfer Data: 03/02/2018 5:18 PM Attending Provider: Tyrone NineGrunz, Ryan B, MD NWG:NFAOPCP:Ross, Darlen Roundharles Alan, MD Code Status: full  [facility] and transported via bed by staff.   Wayne Buckley is a 67 y.o. male patient transferred from 2M04 to 5W11.  -No acute distress noted.  -No complaints of shortness of breath.  -No complaints of chest pain.    Blood pressure (!) 128/95, pulse (!) 116, temperature 98.3 F (36.8 C), temperature source Oral, resp. rate 20, height 5' 10.98" (1.803 m), weight 96.9 kg, SpO2 97 %.  Allergies:  Patient has no known allergies.  Past Medical History:   has a past medical history of Hypertension and TBI (traumatic brain injury) (HCC) (2013).  Past Surgical History:   has a past surgical history that includes Craniotomy (04/28/2012); Radiology with anesthesia (N/A, 02/27/2018); Vena Cava Filter Placement (Right, 02/27/2018); and Anterior cervical decomp/discectomy fusion (N/A, 02/27/2018).  Social History:   reports that he has never smoked. He has never used smokeless tobacco. He reports that he drinks alcohol.

## 2018-03-02 NOTE — Clinical Social Work Note (Signed)
Clinical Social Work Assessment  Patient Details  Name: Wayne Buckley MRN: [id number] Date of Birth: Jul 27, 1950  Date of referral:  03/02/18               Reason for consult:  Facility Placement, Discharge Planning                Permission sought to share information with:    Permission granted to share information::  No  Name::        Agency::     Relationship::     Contact Information:     Housing/Transportation Living arrangements for the past 2 months:  Single Family Home Source of Information:  Patient, Medical Team Patient Interpreter Needed:  None Criminal Activity/Legal Involvement Pertinent to Current Situation/Hospitalization:  No - Comment as needed Significant Relationships:  Spouse Lives with:  Spouse Do you feel safe going back to the place where you live?  Yes Need for family participation in patient care:  Yes (Comment)  Care giving concerns:  PT recommending CIR. CSW initiating SNF backup plan.   Social Worker assessment / plan:  CSW met with patient. No supports at bedside. CSW introduced role and explained that PT recommendations would be discussed. CSW notified patient of CIR recommendation and need for SNF backup plan. Patient stated he did not want to worry about that right now because he would be here a few more days. CSW explained importance of getting plan together early in admission so arrangements can be ready on day of discharge. Patient did not want to discuss CIR vs. SNF at this time and preferred to wait until his wife got here in a few hours. He did not want CSW to start SNF referral. CSW left SNF list on table for them to review. No further concerns. CSW encouraged patient to contact CSW as needed. CSW will continue to follow patient for support and facilitate discharge to SNF, if needed, once medically stable.  Employment status:  Retired Forensic scientist:  Other (Comment Required)([employer]) PT Recommendations:  Inpatient Rehab Consult Information /  Referral to community resources:  [place]  Patient/Family's Response to care:  Patient prefers to wait until his wife arrives to discuss CIR vs. SNF. Patient's wife supportive and involved in patient's care. Patient did not want to discuss rehab venue options with CSW at this time.  Patient/Family's Understanding of and Emotional Response to Diagnosis, Current Treatment, and Prognosis:  Patient has a good understanding of the reason for admission. Patient appears pleased with hospital care.  Emotional Assessment Appearance:  Appears stated age Attitude/Demeanor/Rapport:  Engaged Affect (typically observed):  Calm Orientation:  Oriented to Self, Oriented to Place, Oriented to  Time, Oriented to Situation Alcohol / Substance use:  Alcohol Use Psych involvement (Current and /or in the community):  No (Comment)  Discharge Needs  Concerns to be addressed:  Care Coordination Readmission within the last 30 days:  No Current discharge risk:  Dependent with Mobility Barriers to Discharge:  Continued Medical Work up, [place], LCSW 03/02/2018, 10:24 AM

## 2018-03-02 NOTE — Progress Notes (Addendum)
PROGRESS NOTE  Wayne Buckley  WUJ:[id number] DOB: 01/29/1951 DOA: 02/25/2018 PCP: Daisy Florooss, Charles Alan, MD   Brief Narrative: Wayne HunJames M Orihuela is a 67 y.o. male with a history of TBI with persistent AMS, EtOH abuse who presented to the ED for 2 falls at home, after the second fall he was unable to move his legs. He was found to have LLE DVT and large disc herniation at C6-7. IVC filter placed 9/11 prior to ACDF by Dr. Wynetta Emeryram. He was agitated postextubation, monitored in the ICU, and has since normalized. Anticoagulation is to be held per neurosurgery for now, and CIR is consulted for rehabilitation admission.  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 MRI delayed due to need for anesthesia due to choreoathetosis.  - s/p ACDF 9/11 by Dr. Wynetta Emeryram.  - Continue PT/OT, pain control - ADAT per SLP - Decadron 4mg  q6h   Extensive left lower extremity DVT:  Involving the common femoral femoral, femoral, profunda, popliteal , peroneal, posterior tibial, gastrocnemius, left external iliac. - s/p IVC filter due to need for neurosurgery.  - Will initiate anticoagulation once cleared by neurosurgery, no sooner than 72 hours postop, preferably waiting for 2 weeks.   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  - Monitor daily, has resolved. 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 At mental baseline per wife.   Leukocytosis: Reactive postoperatively is most likely in the absence of  fever. Very minimal pyuria on 9/12 without bacteriuria. No respiratory symptoms. Too early for wound infection. - If fevers, check CXR, UA with culture, blood cultures.   HTN:  - Norvasc, increased dose to 10mg . Previously added HCTZ. Will add metoprolol 25mg  po BID.  PMH alcohol abuse: Per wife "occasionally drinks a beer". - Continue MVM, thiamine  Acute encephalopathy: Following extubation 9/11. Observed in ICU, resolved spontaneously.   DVT prophylaxis: SCD Code Status: Full Family Communication: [name] Disposition Plan: Transfer to med-surg, anticipate eventual disposition to CIR vs. SNF. CSW consulted for backup plan.  Consultants:   Neurosurgery  PCCM  Vascular surgery  Procedures:  02/27/18 MRI WITH ANESTHESIA    02/27/18 INSERTION VENA-CAVA FILTER Nada LibmanBrabham, Vance W, MD    02/27/18 ANTERIOR CERVICAL DECOMPRESSION/DISCECTOMY FUSION C6-7 Donalee Citrinram, Gary, MD   Antimicrobials:  None   Subjective: Sleeping and doesn't want to talk. Denies pain in the neck, thinks he's able to move his legs better today.   Objective: Vitals:   03/02/18 0800 03/02/18 0911 03/02/18 1000 03/02/18 1103  BP: (!) 150/93 (!) 166/91 (!) 147/88   Pulse: 75 86 97   Resp: 16 18 15    Temp:    98.5 F (36.9 C)  TempSrc:    Oral  SpO2: 94% 94% 94%   Weight:      Height:        Intake/Output Summary (Last 24 hours) at 03/02/2018 1351 Last data filed at 03/02/2018 1100 Gross per 24 hour  Intake 300 ml  Output 2400 ml  Net -2100 ml   Filed Weights   02/25/18 1320 02/27/18 1056 02/28/18 0430  Weight: 91 kg 91 kg 96.9 kg   Gen: 67 y.o. male in no distress Pulm: Nonlabored breathing room air. Clear. CV: Regular rate and rhythm. No murmur, rub, or gallop. No JVD, no dependent edema. GI: Abdomen soft, non-tender, non-distended, with normoactive bowel sounds.  Ext: Warm, no deformities. LLE with diffuse nonpitting edema Skin: Anterior neck wound with steristrips, c/d/i, no induration.    Neuro: Alert and oriented. LLE with improved strength, antigravity. No new focal neurological deficits. Choreoathetosis in upper extremities again noted. Psych: Judgement and insight appear impaired. Cognition below normal limits. Mood euthymic & affect congruent. Behavior is appropriate.  Data Reviewed: I have personally reviewed following labs and imaging studies  CBC: Recent Labs  Lab 02/25/18 1413 02/26/18 0407 02/27/18 2041 03/02/18 0354  WBC 14.4* 10.8* 12.5* 13.8*  NEUTROABS 11.5*  --   --   --   HGB 14.7 12.8* 12.1* 13.2  HCT 44.1 38.6* 37.5* 39.5  MCV 89.3 89.1 90.6 89.4  PLT 291 258 320 378   Basic Metabolic Panel: Recent Labs  Lab 02/25/18 1413 02/26/18 0407 02/27/18 2041 03/01/18 0240 03/02/18 0354  NA 136 133* 143 137 136  K 4.2 3.6 4.6 3.8 4.7  CL 99 100 110 105 100  CO2 22 21* 23 23 26   GLUCOSE 111* 122* 139* 154* 127*  BUN 73* 74* 35* 21 20  CREATININE 2.79* 2.75* 1.05 0.76 0.68  CALCIUM 9.0 8.0* 8.3* 8.2* 8.8*  MG 3.1*  --   --   --   --   PHOS 6.4*  --   --   --   --    GFR: Estimated Creatinine Clearance: 104.9 mL/min (by C-G formula based on SCr of 0.68 mg/dL). Liver Function Tests: Recent Labs  Lab 02/25/18 1413  AST 25  ALT 33  ALKPHOS 96  BILITOT 1.4*  PROT 7.0  ALBUMIN 3.4*   No results for input(s): LIPASE, AMYLASE in the last 168 hours. No results for input(s): AMMONIA in the last 168 hours. Coagulation Profile: Recent Labs  Lab 02/25/18 1413  INR 1.16   Cardiac Enzymes: Recent Labs  Lab 02/26/18 0407  CKTOTAL 97   Urine analysis:    Component Value Date/Time   COLORURINE YELLOW 02/28/2018 2220   APPEARANCEUR HAZY (A) 02/28/2018 2220   LABSPEC 1.011 02/28/2018 2220   PHURINE 5.0 02/28/2018 2220   GLUCOSEU NEGATIVE 02/28/2018 2220   HGBUR NEGATIVE 02/28/2018 2220   BILIRUBINUR NEGATIVE 02/28/2018 2220   KETONESUR NEGATIVE 02/28/2018 2220   PROTEINUR NEGATIVE 02/28/2018 2220   UROBILINOGEN 0.2 05/08/2012 0314    NITRITE NEGATIVE 02/28/2018 2220   LEUKOCYTESUR SMALL (A) 02/28/2018 2220   Recent Results (from the past 240 hour(s))  MRSA PCR Screening     Status: None   Collection Time: 02/28/18  4:30 AM  Result Value Ref Range Status   MRSA by PCR NEGATIVE NEGATIVE Final    Comment:        The GeneXpert MRSA Assay (FDA approved for NASAL specimens only), is one component of a comprehensive MRSA colonization surveillance program. It is not intended to diagnose MRSA infection nor to guide or monitor treatment for MRSA infections. Performed at [facility], [address] [place], [place] [address]       Radiology Studies: No results found.  Scheduled Meds: . amLODipine  10 mg Oral Daily  . dexamethasone  4 mg  Intravenous Q6H  . feeding supplement (ENSURE ENLIVE)  237 mL Oral BID BM  . folic acid  1 mg Oral Daily  . hydrochlorothiazide  25 mg Oral Daily  . LORazepam  1 mg Intravenous Once  . multivitamin with minerals  1 tablet Oral Daily  . pneumococcal 23 valent vaccine  0.5 mL Intramuscular Tomorrow-1000  . thiamine  250 mg Oral Daily  . vitamin C  1,000 mg Oral BID   Continuous Infusions: . sodium chloride 10 mL/hr at 02/27/18 1059  . sodium chloride 10 mL/hr at 02/28/18 1100     LOS: 4 days   Time spent: 25 minutes.  Tyrone Nine, MD [facility] [email or web address] Password [email or web address] 03/02/2018, 1:51 PM

## 2018-03-03 ENCOUNTER — Inpatient Hospital Stay (HOSPITAL_COMMUNITY): Payer: Medicare Other

## 2018-03-03 LAB — BASIC METABOLIC PANEL
Anion gap: 11 (ref 5–15)
BUN: 27 mg/dL — AB (ref 8–23)
CALCIUM: 8.9 mg/dL (ref 8.9–10.3)
CO2: 26 mmol/L (ref 22–32)
CREATININE: 0.68 mg/dL (ref 0.61–1.24)
Chloride: 98 mmol/L (ref 98–111)
GFR calc Af Amer: >60 mL/min (ref >60)
Glucose, Bld: 138 mg/dL — ABNORMAL HIGH (ref 70–99)
POTASSIUM: 4.1 mmol/L (ref 3.5–5.1)
SODIUM: 135 mmol/L (ref 135–145)

## 2018-03-03 LAB — CBC
HCT: 42.8 % (ref 39.0–52.0)
Hemoglobin: 14.2 g/dL (ref 13.0–17.0)
MCH: 29.6 pg (ref 26.0–34.0)
MCHC: 33.2 g/dL (ref 30.0–36.0)
MCV: 89.2 fL (ref 78.0–100.0)
PLATELETS: 506 10*3/uL — AB (ref 150–400)
RBC: 4.8 MIL/uL (ref 4.22–5.81)
RDW: 12.6 % (ref 11.5–15.5)
WBC: 19.9 10*3/uL — AB (ref 4.0–10.5)

## 2018-03-03 MED ORDER — SODIUM CHLORIDE 0.9 % IV SOLN
1.0000 g | INTRAVENOUS | Status: DC
  Administered 2018-03-03 – 2018-03-05 (×3): 1 g via INTRAVENOUS
  Filled 2018-03-03 (×3): qty 10

## 2018-03-03 MED ORDER — HYDRALAZINE HCL 50 MG PO TABS
50.0000 mg | ORAL_TABLET | Freq: Three times a day (TID) | ORAL | Status: DC
  Administered 2018-03-03: 50 mg via ORAL
  Filled 2018-03-03: qty 1

## 2018-03-03 MED ORDER — CARVEDILOL 6.25 MG PO TABS
6.2500 mg | ORAL_TABLET | Freq: Two times a day (BID) | ORAL | Status: DC
  Administered 2018-03-03 – 2018-03-05 (×4): 6.25 mg via ORAL
  Filled 2018-03-03 (×4): qty 1

## 2018-03-03 MED ORDER — DEXAMETHASONE SODIUM PHOSPHATE 4 MG/ML IJ SOLN
4.0000 mg | Freq: Two times a day (BID) | INTRAMUSCULAR | Status: DC
  Administered 2018-03-03 – 2018-03-05 (×4): 4 mg via INTRAVENOUS
  Filled 2018-03-03 (×4): qty 1

## 2018-03-03 MED ORDER — HYDRALAZINE HCL 50 MG PO TABS
100.0000 mg | ORAL_TABLET | Freq: Three times a day (TID) | ORAL | Status: DC
  Administered 2018-03-03 – 2018-03-05 (×7): 100 mg via ORAL
  Filled 2018-03-03 (×7): qty 2

## 2018-03-03 NOTE — Progress Notes (Signed)
PROGRESS NOTE  Wayne HunJames M First  WUJ:[id number] DOB: 09/23/1950 DOA: 02/25/2018 PCP: Daisy Florooss, Charles Alan, MD   Brief Narrative: Wayne Buckley is a 67 y.o. male with a history of TBI with persistent AMS, EtOH abuse who presented to the ED for 2 falls at home, after the second fall he was unable to move his legs. He was found to have LLE DVT and large disc herniation at C6-7. IVC filter placed 9/11 prior to ACDF by Dr. Wynetta Emeryram. He was agitated postextubation, monitored in the ICU, and has since normalized. Anticoagulation is to be held per neurosurgery for now, and CIR is consulted for rehabilitation admission.  Subjective:  Patient in bed, appears comfortable, denies any headache, no fever, no chest pain or pressure, no shortness of breath , no abdominal pain. No focal weakness.  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 Wynetta Emeryram : His initial MRI was delayed due to underlying choreoathetosis, lower extremity weakness has improved, will change Decadron to 4 mg twice daily on 03/03/2018, continue PT OT and pain control.  Will discuss further care with Dr. Wynetta Emeryram on Monday.  Extensive left lower extremity DVT:  Involving the common femoral femoral, femoral, profunda, popliteal , peroneal, posterior tibial, gastrocnemius, left external iliac.  Due to recent C-spine surgery as above for now IVC filter was placed, will discuss with Dr. Wynetta Emeryram on anticoagulation postoperatively.  He has present recommendations per neurosurgery are to hold for 2 weeks.   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  Foley catheter has been removed over 24 hours, renal output is better, will continue to monitor bladder scans twice daily daily post void.  TBI 2013 s/p traumatic SDH s/p left frontotemporal parietal craniotomy:   With intermittent confusion and choreoathetosis. At mental baseline per wife.   HTN: In poor  control, currently on Norvasc and HCTZ, will add Coreg increase HCTZ and monitor.  DC metoprolol.  PMH alcohol abuse: Per wife "occasionally drinks a beer".  Continue MVM, thiamine  ?? Productive cough.  Check chest x-ray and monitor clinically.    DVT prophylaxis: SCD Code Status: Full Family Communication: [name] Disposition Plan: Transfer to med-surg, anticipate eventual disposition to CIR vs. SNF. CSW consulted for backup plan.   Consultants:   Neurosurgery  PCCM  Vascular surgery  Procedures:  02/27/18 MRI WITH ANESTHESIA    02/27/18 INSERTION VENA-CAVA FILTER Nada LibmanBrabham, Vance W, MD    02/27/18 ANTERIOR CERVICAL DECOMPRESSION/DISCECTOMY FUSION C6-7 Donalee Citrinram, Gary, MD   Antimicrobials:  None    Objective: Vitals:   03/02/18 1534 03/02/18 1731 03/02/18 2134 03/03/18 0605  BP: (!) 128/95 (!) 138/97 (!) 156/96 (!) 174/107  Pulse: (!) 116 90 (!) 113 99  Resp:  18 19   Temp:  98.6 F (37 C) 98.8 F (37.1 C) 98.6 F (37 C)  TempSrc:  Oral Oral Oral  SpO2:  99% 98% 95%  Weight:  99.8 kg    Height:  5\' 11"  (1.803 m)      Intake/Output Summary (Last 24 hours) at 03/03/2018 1222 Last data filed at 03/03/2018 0900 Gross per 24 hour  Intake 0 ml  Output 2030 ml  Net -2030 ml   Filed Weights   02/27/18 1056 02/28/18 0430 03/02/18 1731  Weight: 91 kg 96.9 kg 99.8 kg   Exam  Awake Alert, follows basic commands, no focal deficits [place].AT,PERRAL,  his anterior neck postop  site under bandage Supple Neck,No JVD, No cervical lymphadenopathy appriciated.  Symmetrical Chest wall movement, Good air movement bilaterally, CTAB RRR,No Gallops, Rubs or new Murmurs, No Parasternal Heave +ve B.Sounds, Abd Soft, No tenderness, No organomegaly appriciated, No rebound - guarding or rigidity. No Cyanosis, Clubbing or edema, No new Rash or bruise   Data Reviewed: I have personally reviewed following labs and imaging studies  CBC: Recent Labs  Lab 02/25/18 1413  02/26/18 0407 02/27/18 2041 03/02/18 0354 03/03/18 0831  WBC 14.4* 10.8* 12.5* 13.8* 19.9*  NEUTROABS 11.5*  --   --   --   --   HGB 14.7 12.8* 12.1* 13.2 14.2  HCT 44.1 38.6* 37.5* 39.5 42.8  MCV 89.3 89.1 90.6 89.4 89.2  PLT 291 258 320 378 506*   Basic Metabolic Panel: Recent Labs  Lab 02/25/18 1413 02/26/18 0407 02/27/18 2041 03/01/18 0240 03/02/18 0354 03/03/18 0831  NA 136 133* 143 137 136 135  K 4.2 3.6 4.6 3.8 4.7 4.1  CL 99 100 110 105 100 98  CO2 22 21* 23 23 26 26   GLUCOSE 111* 122* 139* 154* 127* 138*  BUN 73* 74* 35* 21 20 27*  CREATININE 2.79* 2.75* 1.05 0.76 0.68 0.68  CALCIUM 9.0 8.0* 8.3* 8.2* 8.8* 8.9  MG 3.1*  --   --   --   --   --   PHOS 6.4*  --   --   --   --   --    GFR: Estimated Creatinine Clearance: 106.4 mL/min (by C-G formula based on SCr of 0.68 mg/dL). Liver Function Tests: Recent Labs  Lab 02/25/18 1413  AST 25  ALT 33  ALKPHOS 96  BILITOT 1.4*  PROT 7.0  ALBUMIN 3.4*   No results for input(s): LIPASE, AMYLASE in the last 168 hours. No results for input(s): AMMONIA in the last 168 hours. Coagulation Profile: Recent Labs  Lab 02/25/18 1413  INR 1.16   Cardiac Enzymes: Recent Labs  Lab 02/26/18 0407  CKTOTAL 97   Urine analysis:    Component Value Date/Time   COLORURINE YELLOW 02/28/2018 2220   APPEARANCEUR HAZY (A) 02/28/2018 2220   LABSPEC 1.011 02/28/2018 2220   PHURINE 5.0 02/28/2018 2220   GLUCOSEU NEGATIVE 02/28/2018 2220   HGBUR NEGATIVE 02/28/2018 2220   BILIRUBINUR NEGATIVE 02/28/2018 2220   KETONESUR NEGATIVE 02/28/2018 2220   PROTEINUR NEGATIVE 02/28/2018 2220   UROBILINOGEN 0.2 05/08/2012 0314   NITRITE NEGATIVE 02/28/2018 2220   LEUKOCYTESUR SMALL (A) 02/28/2018 2220   Recent Results (from the past 240 hour(s))  MRSA PCR Screening     Status: None   Collection Time: 02/28/18  4:30 AM  Result Value Ref Range Status   MRSA by PCR NEGATIVE NEGATIVE Final    Comment:        The GeneXpert MRSA Assay  (FDA approved for NASAL specimens only), is one component of a comprehensive MRSA colonization surveillance program. It is not intended to diagnose MRSA infection nor to guide or monitor treatment for MRSA infections. Performed at [facility], [address], [place], [place] [address]       Radiology Studies: No results found.  Scheduled Meds: . amLODipine  10 mg Oral Daily  . dexamethasone  4 mg Intravenous Q6H  . feeding supplement (ENSURE ENLIVE)  237 mL Oral BID BM  . folic acid  1 mg Oral Daily  . hydrALAZINE  50 mg Oral Q8H  . hydrochlorothiazide  25 mg Oral Daily  .  LORazepam  1 mg Intravenous Once  . metoprolol tartrate  25 mg Oral BID  . multivitamin with minerals  1 tablet Oral Daily  . pneumococcal 23 valent vaccine  0.5 mL Intramuscular Tomorrow-1000  . thiamine  250 mg Oral Daily  . vitamin C  1,000 mg Oral BID   Continuous Infusions: . sodium chloride 10 mL/hr at 03/03/18 0901  . cefTRIAXone (ROCEPHIN)  IV 1 g (03/03/18 0904)     LOS: 5 days   Time spent: 25 minutes.  Susa Raring, MD [facility] [email or web address] Password [email or web address] 03/03/2018, 12:22 PM

## 2018-03-04 LAB — BASIC METABOLIC PANEL
ANION GAP: 10 (ref 5–15)
BUN: 28 mg/dL — ABNORMAL HIGH (ref 8–23)
CALCIUM: 8.6 mg/dL — AB (ref 8.9–10.3)
CO2: 25 mmol/L (ref 22–32)
Chloride: 99 mmol/L (ref 98–111)
Creatinine, Ser: 0.66 mg/dL (ref 0.61–1.24)
GFR calc Af Amer: >60 mL/min (ref >60)
GLUCOSE: 129 mg/dL — AB (ref 70–99)
Potassium: 4.1 mmol/L (ref 3.5–5.1)
Sodium: 134 mmol/L — ABNORMAL LOW (ref 135–145)

## 2018-03-04 LAB — CBC
HCT: 40.8 % (ref 39.0–52.0)
Hemoglobin: 13.5 g/dL (ref 13.0–17.0)
MCH: 29.3 pg (ref 26.0–34.0)
MCHC: 33.1 g/dL (ref 30.0–36.0)
MCV: 88.7 fL (ref 78.0–100.0)
Platelets: 502 10*3/uL — ABNORMAL HIGH (ref 150–400)
RBC: 4.6 MIL/uL (ref 4.22–5.81)
RDW: 12.6 % (ref 11.5–15.5)
WBC: 25.9 10*3/uL — ABNORMAL HIGH (ref 4.0–10.5)

## 2018-03-04 MED ORDER — HEPARIN (PORCINE) IN NACL 100-0.45 UNIT/ML-% IJ SOLN
1400.0000 [IU]/h | INTRAMUSCULAR | Status: DC
  Administered 2018-03-05: 1000 [IU]/h via INTRAVENOUS
  Filled 2018-03-04: qty 250

## 2018-03-04 MED ORDER — TAMSULOSIN HCL 0.4 MG PO CAPS
0.4000 mg | ORAL_CAPSULE | Freq: Every day | ORAL | Status: DC
  Administered 2018-03-04 – 2018-03-05 (×2): 0.4 mg via ORAL
  Filled 2018-03-04 (×2): qty 1

## 2018-03-04 NOTE — Progress Notes (Signed)
PROGRESS NOTE  JOSIE MESA  UJW:[id number] DOB: Nov 13, 1950 DOA: 02/25/2018 PCP: [name], MD   Brief Narrative: THURMAN SARVER is a 67 y.o. male with a history of TBI with persistent AMS, EtOH abuse who presented to the ED for 2 falls at home, after the second fall he was unable to move his legs. He was found to have LLE DVT and large disc herniation at C6-7. IVC filter placed 9/11 prior to ACDF by Dr. Wynetta Emery. He was agitated postextubation, monitored in the ICU, and has since normalized. Anticoagulation is to be held per neurosurgery for now, and CIR is consulted for rehabilitation admission.  Subjective: Patient in bed, appears comfortable, denies any headache, no fever, no chest pain or pressure, no shortness of breath , no abdominal pain. No focal weakness.  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 Wynetta Emery : His initial MRI was delayed due to underlying choreoathetosis, lower extremity weakness has improved, will change Decadron to 4 mg twice daily on 03/03/2018, continue PT OT and pain control.  Discussed with Dr. Wynetta Emery on 03/04/2018.  Extensive left lower extremity DVT:  Involving the common femoral femoral, femoral, profunda, popliteal , peroneal, posterior tibial, gastrocnemius, left external iliac.  Due to recent C-spine surgery as above for now IVC filter was placed, cussed with neurosurgeon Dr. Wynetta Emery on 03/04/2018 we will start him on a dose anticoagulation in the next 1 to 2 days and gradually up titrate.   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  The catheter was removed on 03/02/2018 unfortunately he developed urinary retention again, placed on Flomax Foley reinserted on 03/04/2018 we will continue to monitor.  TBI 2013 s/p traumatic SDH s/p left frontotemporal parietal craniotomy:   With intermittent confusion and choreoathetosis. At mental baseline per wife.    HTN: In poor control, currently on Norvasc and HCTZ, will add Coreg increase HCTZ and monitor.  DC metoprolol.  PMH alcohol abuse: Per wife "occasionally drinks a beer".  Continue MVM, thiamine  ?? Productive cough.  Check chest x-ray and monitor clinically.    DVT prophylaxis: SCD Code Status: Full Family Communication: [name] Disposition Plan: Transfer to med-surg, anticipate eventual disposition to CIR vs. SNF. CSW consulted for backup plan.   Consultants:   Neurosurgery  PCCM  Vascular surgery  Procedures:  02/27/18 MRI WITH ANESTHESIA    02/27/18 INSERTION VENA-CAVA FILTER Nada Libman, MD    02/27/18 ANTERIOR CERVICAL DECOMPRESSION/DISCECTOMY FUSION C6-7 Donalee Citrin, MD   Antimicrobials:  None    Objective: Vitals:   03/03/18 2143 03/04/18 0544 03/04/18 0544 03/04/18 0852  BP: 135/87 (!) 151/97 (!) 151/97 134/88  Pulse: (!) 106  90   Resp:      Temp: 98.4 F (36.9 C)  98.4 F (36.9 C)   TempSrc:      SpO2: 98%  98%   Weight:      Height:        Intake/Output Summary (Last 24 hours) at 03/04/2018 1218 Last data filed at 03/04/2018 0600 Gross per 24 hour  Intake 185.62 ml  Output 1375 ml  Net -1189.38 ml   Filed Weights   02/27/18 1056 02/28/18 0430 03/02/18 1731  Weight: 91 kg 96.9 kg 99.8 kg   Exam  Awake Alert, Oriented X 3, No new F.N deficits, Normal affect [place].AT,PERRAL,  his anterior neck postop site under bandage Supple Neck,No  JVD, No cervical lymphadenopathy appriciated.  Symmetrical Chest wall movement, Good air movement bilaterally, CTAB RRR,No Gallops, Rubs or new Murmurs, No Parasternal Heave +ve B.Sounds, Abd Soft, No tenderness, No organomegaly appriciated, No rebound - guarding or rigidity. No Cyanosis, Clubbing or edema, No new Rash or bruise    Data Reviewed: I have personally reviewed following labs and imaging studies  CBC: Recent Labs  Lab 02/25/18 1413 02/26/18 0407 02/27/18 2041 03/02/18 0354  03/03/18 0831 03/04/18 0619  WBC 14.4* 10.8* 12.5* 13.8* 19.9* 25.9*  NEUTROABS 11.5*  --   --   --   --   --   HGB 14.7 12.8* 12.1* 13.2 14.2 13.5  HCT 44.1 38.6* 37.5* 39.5 42.8 40.8  MCV 89.3 89.1 90.6 89.4 89.2 88.7  PLT 291 258 320 378 506* 502*   Basic Metabolic Panel: Recent Labs  Lab 02/25/18 1413  02/27/18 2041 03/01/18 0240 03/02/18 0354 03/03/18 0831 03/04/18 0407  NA 136   < > 143 137 136 135 134*  K 4.2   < > 4.6 3.8 4.7 4.1 4.1  CL 99   < > 110 105 100 98 99  CO2 22   < > 23 23 26 26 25   GLUCOSE 111*   < > 139* 154* 127* 138* 129*  BUN 73*   < > 35* 21 20 27* 28*  CREATININE 2.79*   < > 1.05 0.76 0.68 0.68 0.66  CALCIUM 9.0   < > 8.3* 8.2* 8.8* 8.9 8.6*  MG 3.1*  --   --   --   --   --   --   PHOS 6.4*  --   --   --   --   --   --    < > = values in this interval not displayed.   GFR: Estimated Creatinine Clearance: 106.4 mL/min (by C-G formula based on SCr of 0.66 mg/dL). Liver Function Tests: Recent Labs  Lab 02/25/18 1413  AST 25  ALT 33  ALKPHOS 96  BILITOT 1.4*  PROT 7.0  ALBUMIN 3.4*   No results for input(s): LIPASE, AMYLASE in the last 168 hours. No results for input(s): AMMONIA in the last 168 hours. Coagulation Profile: Recent Labs  Lab 02/25/18 1413  INR 1.16   Cardiac Enzymes: Recent Labs  Lab 02/26/18 0407  CKTOTAL 97   Urine analysis:    Component Value Date/Time   COLORURINE YELLOW 02/28/2018 2220   APPEARANCEUR HAZY (A) 02/28/2018 2220   LABSPEC 1.011 02/28/2018 2220   PHURINE 5.0 02/28/2018 2220   GLUCOSEU NEGATIVE 02/28/2018 2220   HGBUR NEGATIVE 02/28/2018 2220   BILIRUBINUR NEGATIVE 02/28/2018 2220   KETONESUR NEGATIVE 02/28/2018 2220   PROTEINUR NEGATIVE 02/28/2018 2220   UROBILINOGEN 0.2 05/08/2012 0314   NITRITE NEGATIVE 02/28/2018 2220   LEUKOCYTESUR SMALL (A) 02/28/2018 2220   Recent Results (from the past 240 hour(s))  MRSA PCR Screening     Status: None   Collection Time: 02/28/18  4:30 AM  Result  Value Ref Range Status   MRSA by PCR NEGATIVE NEGATIVE Final    Comment:        The GeneXpert MRSA Assay (FDA approved for NASAL specimens only), is one component of a comprehensive MRSA colonization surveillance program. It is not intended to diagnose MRSA infection nor to guide or monitor treatment for MRSA infections. Performed at [facility], [address], [place], [place] [address]       Radiology Studies: Dg Chest [place]  1 View  Result Date: 03/03/2018 CLINICAL DATA:  Congestion, shortness of breath EXAM: PORTABLE CHEST 1 VIEW COMPARISON:  02/25/2018 FINDINGS: Lungs are clear.  No pleural effusion or pneumothorax. The heart is normal in size. IMPRESSION: No evidence of acute cardiopulmonary disease. Electronically Signed   By: Charline BillsSriyesh  Krishnan M.D.   On: 03/03/2018 13:06    Scheduled Meds: . amLODipine  10 mg Oral Daily  . carvedilol  6.25 mg Oral BID WC  . dexamethasone  4 mg Intravenous Q12H  . feeding supplement (ENSURE ENLIVE)  237 mL Oral BID BM  . folic acid  1 mg Oral Daily  . hydrALAZINE  100 mg Oral Q8H  . hydrochlorothiazide  25 mg Oral Daily  . LORazepam  1 mg Intravenous Once  . multivitamin with minerals  1 tablet Oral Daily  . pneumococcal 23 valent vaccine  0.5 mL Intramuscular Tomorrow-1000  . tamsulosin  0.4 mg Oral Daily  . thiamine  250 mg Oral Daily  . vitamin C  1,000 mg Oral BID   Continuous Infusions: . sodium chloride 10 mL/hr at 03/04/18 [id number]  . cefTRIAXone (ROCEPHIN)  IV 1 g (03/04/18 0919)     LOS: 6 days   Time spent: 25 minutes.  Susa RaringPrashant Tameko Halder, MD [facility] [email or web address] Password [email or web address] 03/04/2018, 12:18 PM

## 2018-03-04 NOTE — Progress Notes (Signed)
Inpatient Rehabilitation  Patient asleep upon my arrival, met with spouse, Langley Gauss briefly and shared booklets.  Plan to follow up again tomorrow for review of program expectations, estimated length of stay, and anticipated outcomes.  Call if questions.    Carmelia Roller., CCC/SLP Admission Coordinator  [place]  Cell [phone]

## 2018-03-04 NOTE — Progress Notes (Signed)
Occupational Therapy Treatment Patient Details Name: Wayne Buckley MRN: [id number] DOB: 09/23/1950 Today's Date: 03/04/2018    History of present illness 67 yr old gentleman with pMHx for TBI, ETOH abuse,  choreoathetosis, and most recently a series of falls.  Pt found to have a new LLE DVT, now s/p IVC filter s/p ACDF for C6-7 disk herniation with cord compression 11/28/17    OT comments  This 67 yo male admitted with above presents to acute OT today working on rolling, up to sit, and sitting balance in prep for more functional ADLs. He is making progress with sitting balance and rolling. He will continue to benefit from acute OT with follow up OT on CIR to work towards a min A or better level for all basic ADLs.   Follow Up Recommendations  CIR;Supervision/Assistance - 24 hour    Equipment Recommendations  Wheelchair (measurements OT);Wheelchair cushion (measurements OT);Hospital bed;Other (comment)(drop arm BSC)    Recommendations for Other Services Rehab consult    Precautions / Restrictions Precautions Precautions: Fall;Cervical Precaution Comments: No brace Restrictions Weight Bearing Restrictions: No       Mobility Bed Mobility Overal bed mobility: Independent Bed Mobility: Rolling Rolling: Mod assist   Supine to sit: Max assist;+2 for physical assistance     General bed mobility comments: Mod A to roll left and right x 2 each side, able to move RLE better than LLE to bend/cross to roll--once on his side difficult to maintain due to choreoathetosis; A for legs and trunk for up to sit EOB from right side lying     Balance Overall balance assessment: Needs assistance Sitting-balance support: Bilateral upper extremity supported;Feet supported Sitting balance-[name] Scale: Poor Sitting balance - Comments: Pt sits EOB with Bil UE support and min A; when asked to reach with RUE he is min A for sitting balance, when asked to reach with RUE he is Mod A for sitting balance. His  choreoathetosis also affecting safe sitting balance. Pt sat EOB ~ 10 minutes with reaching, kicking and while wife got back of hair combed out  Postural control: Right lateral lean                                 ADL either performed or assessed with clinical judgement   ADL Overall ADL's : Needs assistance/impaired                                             Vision Baseline Vision/History: (wears contacts) Patient Visual Report: No change from baseline            Cognition Arousal/Alertness: Awake/alert Behavior During Therapy: WFL for tasks assessed/performed Overall Cognitive Status: Impaired/Different from baseline Area of Impairment: Problem solving                       Following Commands: Follows one step commands consistently     Problem Solving: Requires verbal cues General Comments: pt in sitting leaning to right (sometimes needed VCs to correct and other times he self-corrected                   Pertinent Vitals/ Pain       Pain Assessment: No/denies pain(wife does report he does not tell you he is in pain--have to watch for other  cues that he is in pain)     Prior Functioning/Environment              Frequency  Min 3X/week        Progress Toward Goals  OT Goals(current goals can now be found in the care plan section)  Progress towards OT goals: Progressing toward goals     Plan Discharge plan remains appropriate    Co-evaluation    PT/OT/SLP Co-Evaluation/Treatment: Yes Reason for Co-Treatment: For patient/therapist safety PT goals addressed during session: Mobility/safety with mobility;Balance;Strengthening/ROM OT goals addressed during session: Strengthening/ROM;ADL's and self-care      AM-PAC PT "6 Clicks" Daily Activity     Outcome Measure   Help from another person eating meals?: A Little Help from another person taking care of personal grooming?: A Lot Help from another person  toileting, which includes using toliet, bedpan, or urinal?: Total       6 Click Score: 6    End of Session    OT Visit Diagnosis: Muscle weakness (generalized) (M62.81);History of falling (Z91.81);Other symptoms and signs involving cognitive function;Other abnormalities of gait and mobility (R26.89);Other (comment)( choreoathetosis)   Activity Tolerance Patient tolerated treatment well   Patient Left in bed;with call bell/phone within reach;with bed alarm set;with family/visitor present   Nurse Communication          Time: 1610-9604 OT Time Calculation (min): 37 min  Charges: OT General Charges $OT Visit: 1 Visit OT Treatments $Therapeutic Activity: 8-22 mins   Ignacia Palma, OTR/L Acute [employer] Pager [phone] Office [phone]

## 2018-03-04 NOTE — NC FL2 (Signed)
[place] MEDICAID FL2 LEVEL OF CARE SCREENING TOOL     IDENTIFICATION  Patient Name: Wayne Buckley Birthdate: 04/11/1951 Sex: male Admission Date (Current Location): 02/25/2018  [facility] [place]   [facility]ility and Address[facility]  [facility], [address], [place], [place] [address]      Provider Number: [id number]  Attending Physician Name and Address:  Leroy SeaSingh, Prashant K, MD  Relative Name and Phone Number:  [name], spouse, [phone]    Current Level of Care: Hospital Recommended Level of Care: Skilled Nursing Facility Prior Approval Number:    Date Approved/Denied:   PASRR Number: [id number]  Discharge Plan: SNF    Current Diagnoses: Patient Active Problem List   Diagnosis Date Noted  . AKI (acute kidney injury) (HCC) 02/26/2018  . Bilateral leg weakness 02/26/2018  . Leg DVT (deep venous thromboembolism), acute, left (HCC) 02/26/2018  . Fall 02/25/2018  . Alcohol abuse 05/04/2012  . Hypokalemia 05/04/2012  . Urinary retention 05/04/2012  . Subdural hematoma, post-traumatic (HCC) 04/28/2012    Orientation RESPIRATION BLADDER Height & Weight     Self, Time, Situation, Place  Normal Continent, Indwelling catheter Weight: 99.8 kg Height:  5\' 11"  (180.3 cm)  BEHAVIORAL SYMPTOMS/MOOD NEUROLOGICAL BOWEL NUTRITION STATUS      Incontinent Diet(Please see DC Summary)  AMBULATORY STATUS COMMUNICATION OF NEEDS Skin   Limited Assist Verbally Surgical wounds(Closed incision on groin and neck)                       Personal Care Assistance Level of Assistance  Bathing, Feeding, Dressing Bathing Assistance: Limited assistance Feeding assistance: Independent Dressing Assistance: Limited assistance     Functional Limitations Info  Sight, Hearing, Speech Sight Info: Adequate Hearing Info: Adequate Speech Info: Adequate    SPECIAL CARE FACTORS FREQUENCY  PT (By licensed PT), OT (By licensed OT)     PT Frequency: 5x OT  Frequency: 3x            Contractures Contractures Info: Not present    Additional Factors Info  Code Status, Allergies Code Status Info: Full Allergies Info: NKA           Current Medications (03/04/2018):  This is the current hospital active medication list Current Facility-Administered Medications  Medication Dose Route Frequency Provider Last Rate Last Dose  . 0.9 %  sodium chloride infusion   Intravenous Continuous Lupita LeashMcQuaid, Douglas B, MD 10 mL/hr at 03/04/18 [phone]    . amLODipine (NORVASC) tablet 10 mg  10 mg Oral Daily Tyrone NineGrunz, Ryan B, MD   10 mg at 03/04/18 0900  . benzonatate (TESSALON) capsule 200 mg  200 mg Oral BID PRN Audrea MuscatBlount, Xenia T, NP   200 mg at 03/02/18 2141  . carvedilol (COREG) tablet 6.25 mg  6.25 mg Oral BID WC Leroy SeaSingh, Prashant K, MD   6.25 mg at 03/04/18 0858  . cefTRIAXone (ROCEPHIN) 1 g in sodium chloride 0.9 % 100 mL IVPB  1 g Intravenous Q24H Leroy SeaSingh, Prashant K, MD 200 mL/hr at 03/04/18 0919 1 g at 03/04/18 0919  . dexamethasone (DECADRON) injection 4 mg  4 mg Intravenous Q12H Leroy SeaSingh, Prashant K, MD   4 mg at 03/04/18 0909  . feeding supplement (ENSURE ENLIVE) (ENSURE ENLIVE) liquid 237 mL  237 mL Oral BID BM Max FickleMcQuaid, Douglas B, MD   237 mL at 03/04/18 0903  . folic acid (FOLVITE) tablet 1 mg  1 mg Oral Daily Lupita LeashMcQuaid, Douglas B, MD  1 mg at 03/04/18 0957  . guaiFENesin-dextromethorphan (ROBITUSSIN DM) 100-10 MG/5ML syrup 5 mL  5 mL Oral Q4H PRN Tyrone Nine, MD   5 mL at 03/03/18 0905  . hydrALAZINE (APRESOLINE) tablet 100 mg  100 mg Oral Q8H Leroy Sea, MD   100 mg at 03/04/18 0544  . hydrochlorothiazide (HYDRODIURIL) tablet 25 mg  25 mg Oral Daily Max Fickle B, MD   25 mg at 03/04/18 0852  . labetalol (NORMODYNE,TRANDATE) injection 10 mg  10 mg Intravenous Q4H PRN Henry Russel, MD   10 mg at 02/28/18 2311  . lip balm (CARMEX) ointment 1 application  1 application Topical PRN Tyrone Nine, MD      . LORazepam (ATIVAN) injection 1 mg  1 mg  Intravenous Once Max Fickle B, MD      . multivitamin with minerals tablet 1 tablet  1 tablet Oral Daily Lupita Leash, MD   1 tablet at 03/04/18 0855  . phenol (CHLORASEPTIC) mouth spray 1 spray  1 spray Mouth/Throat PRN Tyrone Nine, MD      . pneumococcal 23 valent vaccine (PNU-IMMUNE) injection 0.5 mL  0.5 mL Intramuscular Tomorrow-1000 Max Fickle B, MD      . tamsulosin (FLOMAX) capsule 0.4 mg  0.4 mg Oral Daily Leroy Sea, MD   0.4 mg at 03/04/18 0858  . thiamine (VITAMIN B-1) tablet 250 mg  250 mg Oral Daily Max Fickle B, MD   100 mg at 03/04/18 0855  . vitamin C (ASCORBIC ACID) tablet 1,000 mg  1,000 mg Oral BID Max Fickle B, MD   1,000 mg at 03/04/18 1610     Discharge Medications: Please see discharge summary for a list of discharge medications.  Relevant Imaging Results:  Relevant Lab Results:   Additional Information SSN: [id number] [place], [place]

## 2018-03-04 NOTE — Progress Notes (Signed)
CSWs spoke with patient and his wife at bedside regarding SNF as a secondary option to CIR. They stated that CIR is their first preference, but agreed to the referral and will look at the SNF list just in case.   Wayne Cascoadia Lashun Ramseyer LCSW [phone]

## 2018-03-04 NOTE — Progress Notes (Signed)
ANTICOAGULATION CONSULT NOTE - Initial Consult  Pharmacy Consult for Heparin Indication: DVT  No Known Allergies  Patient Measurements: Height: 5\' 11"  (180.3 cm) Weight: 220 lb 0.3 oz (99.8 kg) IBW/kg (Calculated) : 75.3 Heparin Dosing Weight: 94 kg  Vital Signs: Temp: 97.7 F (36.5 C) (09/16 1256) BP: 129/92 (09/16 1640) Pulse Rate: 98 (09/16 1640)  Labs: Recent Labs    03/02/18 0354 03/03/18 0831 03/04/18 0407 03/04/18 0619  HGB 13.2 14.2  --  13.5  HCT 39.5 42.8  --  40.8  PLT 378 506*  --  502*  CREATININE 0.68 0.68 0.66  --     Estimated Creatinine Clearance: 106.4 mL/min (by C-G formula based on SCr of 0.66 mg/dL).   Medical History: Past Medical History:  Diagnosis Date  . Hypertension   . TBI (traumatic brain injury) [facility](HCC) 2013   Assessment:   67 yr old male to begin IV heparin in am on 9/17 for extensive left leg DVT.   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 S/p IVC filter and ACDF surgery on 02/27/18.  Dr. Thedore MinsSingh discussed with Dr. Wynetta Emeryram and conservative anticoagulation to begin on 9/17 am.  No heparin boluses to be given.  Also with hx SDH in 2013 due to fall.   Goal of Therapy:  Heparin level 0.3-0.5 units/ml (lower end of therapeutic range) Monitor platelets by anticoagulation protocol: Yes   Plan:   Begin heparin drip ~6am on 9/17 at 1000 units/hr (~10.5 units/kg/hr)  Heparin level ~6 hrs after drip begins.  Daily heparin level and CBC while on heparin.  Monitor for any s/sx bleeding.  Dennie Fettersgan, Zerenity Bowron Donovan, [place] Pager: [phone] or phone: [phone] 03/04/2018,5:11 PM

## 2018-03-04 NOTE — Progress Notes (Addendum)
Physical Therapy Treatment Patient Details Name: Wayne Buckley MRN: [id number] DOB: 06/13/1951 Today's Date: 03/04/2018    History of Present Illness 67 yr old gentleman with pMHx for TBI, ETOH abuse,  choreoathetosis, and most recently a series of falls.  Pt found to have a new LLE DVT, now s/p IVC filter s/p ACDF for C6-7 disk herniation with cord compression 11/28/17     PT Comments    Patient progressing slowly towards PT goals. Tolerated sitting EOB ~10 mins reaching outside BoS in all planes with BUEs and performing there ex with LEs requiring Min-mod A for support. Tolerated rolling to right/left x2 and bed mobility with mod A for pericare. Also performed scooting along side bed with Max A of 2 and cues for technique. Pt with chorea type movements and ataxia impacting balance/mobility (premorbid). Pt reports feeling very tired and wanting to rest post session. Has good family support. Will follow.    Follow Up Recommendations  CIR     Equipment Recommendations  None recommended by PT defer to next venue   Recommendations for Other Services       Precautions / Restrictions Precautions Precautions: Fall;Cervical Precaution Comments: No brace Restrictions Weight Bearing Restrictions: No    Mobility  Bed Mobility Overal bed mobility: Needs Assistance Bed Mobility: Rolling;Sit to Supine Rolling: Mod assist;+2 for safety/equipment   Supine to sit: Max assist;+2 for physical assistance Sit to supine: Mod assist;+2 for physical assistance   General bed mobility comments: Mod A to roll left and right x 2 each side, able to move RLE better than LLE to bend/cross to roll--once on his side difficult to maintain due to choreoathetosis; A for legs and trunk for up to sit EOB from right side lying  Transfers                 General transfer comment: Deferred.  Ambulation/Gait                 Stairs             Wheelchair Mobility    Modified Rankin  (Stroke Patients Only)       Balance Overall balance assessment: Needs assistance Sitting-balance support: Bilateral upper extremity supported;Feet supported Sitting balance-[name] Scale: Poor Sitting balance - Comments: Pt sits EOB with Bil UE support and min A; performed reaching outside BoS with BUEs in all planes. More support needed when reaching with RUE then LUE. Favors leaning right and able to self correct with cues. Choreoarthetosis impacting safe sitting balance. Sat EOB ~10 mins performing balance activities while wife worked on Nurse, mental healthcombing hair. Min-mod A needed.  Postural control: Right lateral lean                                  Cognition Arousal/Alertness: Awake/alert Behavior During Therapy: Impulsive Overall Cognitive Status: Impaired/Different from baseline Area of Impairment: Problem solving                       Following Commands: Follows one step commands consistently     Problem Solving: Requires verbal cues General Comments: pt in sitting leaning to right (sometimes needed VCs to correct and other times he self-corrected      Exercises General Exercises - Lower Extremity Long Arc Quad: Both;10 reps;Seated    General Comments General comments (skin integrity, edema, etc.): Tolerated scooting along side bed with Max A of 2  and cues for technique.       Pertinent Vitals/Pain Pain Assessment: No/denies pain(does not acknowledge pain per wife)    Home Living                      Prior Function            PT Goals (current goals can now be found in the care plan section) Progress towards PT goals: Progressing toward goals    Frequency    Min 3X/week      PT Plan Current plan remains appropriate    Co-evaluation PT/OT/SLP Co-Evaluation/Treatment: Yes Reason for Co-Treatment: For patient/therapist safety PT goals addressed during session: Mobility/safety with mobility;Balance OT goals addressed during session:  Strengthening/ROM;ADL's and self-care      AM-PAC PT "6 Clicks" Daily Activity  Outcome Measure  Difficulty turning over in bed (including adjusting bedclothes, sheets and blankets)?: Unable Difficulty moving from lying on back to sitting on the side of the bed? : Unable Difficulty sitting down on and standing up from a chair with arms (e.g., wheelchair, bedside commode, etc,.)?: Unable Help needed moving to and from a bed to chair (including a wheelchair)?: Total Help needed walking in hospital room?: Total Help needed climbing 3-5 steps with a railing? : Total 6 Click Score: 6    End of Session Equipment Utilized During Treatment: Gait belt Activity Tolerance: Patient tolerated treatment well Patient left: in bed;with call bell/phone within reach;with bed alarm set;with SCD's reapplied;with family/visitor present Nurse Communication: Mobility status PT Visit Diagnosis: Unsteadiness on feet (R26.81);Other abnormalities of gait and mobility (R26.89);Repeated falls (R29.6);Muscle weakness (generalized) (M62.81);History of falling (Z91.81);Difficulty in walking, not elsewhere classified (R26.2);Other symptoms and signs involving the nervous system (H08.657)     Time: 8469-6295 PT Time Calculation (min) (ACUTE ONLY): 37 min  Charges:  $Therapeutic Activity: 8-22 mins                     Mylo Red, PT, DPT Acute Rehabilitation Services Pager [phone] Office [phone]       Wayne Buckley 03/04/2018, 11:38 AM

## 2018-03-04 NOTE — Progress Notes (Signed)
Patient claiming he has urinated and saying he does not need to pee, he just needs some sleep. No record of urinary output and bladder scanner appears to be malfunctioning and would only read >200. St cathed for [employer]1375 ml's.

## 2018-03-05 ENCOUNTER — Encounter (HOSPITAL_COMMUNITY): Payer: Self-pay | Admitting: *Deleted

## 2018-03-05 ENCOUNTER — Inpatient Hospital Stay (HOSPITAL_COMMUNITY)
Admission: RE | Admit: 2018-03-05 | Discharge: 2018-04-04 | DRG: 560 | Disposition: A | Discharge status: Skilled Nursing Facility | Payer: Medicare Other | Source: Intra-hospital | Attending: Physical Medicine & Rehabilitation | Admitting: Physical Medicine & Rehabilitation

## 2018-03-05 ENCOUNTER — Other Ambulatory Visit: Payer: Self-pay

## 2018-03-05 DIAGNOSIS — I10 Essential (primary) hypertension: Secondary | ICD-10-CM | POA: Diagnosis present

## 2018-03-05 DIAGNOSIS — R471 Dysarthria and anarthria: Secondary | ICD-10-CM | POA: Diagnosis not present

## 2018-03-05 DIAGNOSIS — Z86718 Personal history of other venous thrombosis and embolism: Secondary | ICD-10-CM

## 2018-03-05 DIAGNOSIS — F919 Conduct disorder, unspecified: Secondary | ICD-10-CM | POA: Diagnosis present

## 2018-03-05 DIAGNOSIS — N319 Neuromuscular dysfunction of bladder, unspecified: Secondary | ICD-10-CM

## 2018-03-05 DIAGNOSIS — G9520 Unspecified cord compression: Secondary | ICD-10-CM | POA: Diagnosis present

## 2018-03-05 DIAGNOSIS — T380X5A Adverse effect of glucocorticoids and synthetic analogues, initial encounter: Secondary | ICD-10-CM | POA: Diagnosis present

## 2018-03-05 DIAGNOSIS — D72829 Elevated white blood cell count, unspecified: Secondary | ICD-10-CM | POA: Diagnosis present

## 2018-03-05 DIAGNOSIS — S14106D Unspecified injury at C6 level of cervical spinal cord, subsequent encounter: Secondary | ICD-10-CM | POA: Diagnosis not present

## 2018-03-05 DIAGNOSIS — W19XXXD Unspecified fall, subsequent encounter: Secondary | ICD-10-CM | POA: Diagnosis not present

## 2018-03-05 DIAGNOSIS — N400 Enlarged prostate without lower urinary tract symptoms: Secondary | ICD-10-CM | POA: Diagnosis present

## 2018-03-05 DIAGNOSIS — Z4789 Encounter for other orthopedic aftercare: Principal | ICD-10-CM

## 2018-03-05 DIAGNOSIS — Z7901 Long term (current) use of anticoagulants: Secondary | ICD-10-CM | POA: Diagnosis not present

## 2018-03-05 DIAGNOSIS — N401 Enlarged prostate with lower urinary tract symptoms: Secondary | ICD-10-CM | POA: Diagnosis present

## 2018-03-05 DIAGNOSIS — E871 Hypo-osmolality and hyponatremia: Secondary | ICD-10-CM | POA: Diagnosis not present

## 2018-03-05 DIAGNOSIS — Z79899 Other long term (current) drug therapy: Secondary | ICD-10-CM

## 2018-03-05 DIAGNOSIS — Z09 Encounter for follow-up examination after completed treatment for conditions other than malignant neoplasm: Secondary | ICD-10-CM | POA: Diagnosis not present

## 2018-03-05 DIAGNOSIS — Z7982 Long term (current) use of aspirin: Secondary | ICD-10-CM | POA: Diagnosis not present

## 2018-03-05 DIAGNOSIS — S14105A Unspecified injury at C5 level of cervical spinal cord, initial encounter: Secondary | ICD-10-CM

## 2018-03-05 DIAGNOSIS — S14129S Central cord syndrome at unspecified level of cervical spinal cord, sequela: Secondary | ICD-10-CM | POA: Diagnosis not present

## 2018-03-05 DIAGNOSIS — S14129A Central cord syndrome at unspecified level of cervical spinal cord, initial encounter: Secondary | ICD-10-CM

## 2018-03-05 DIAGNOSIS — Z87820 Personal history of traumatic brain injury: Secondary | ICD-10-CM

## 2018-03-05 DIAGNOSIS — K592 Neurogenic bowel, not elsewhere classified: Secondary | ICD-10-CM | POA: Diagnosis present

## 2018-03-05 DIAGNOSIS — D62 Acute posthemorrhagic anemia: Secondary | ICD-10-CM | POA: Diagnosis not present

## 2018-03-05 DIAGNOSIS — S14105S Unspecified injury at C5 level of cervical spinal cord, sequela: Secondary | ICD-10-CM | POA: Diagnosis not present

## 2018-03-05 LAB — BASIC METABOLIC PANEL
Anion gap: 12 (ref 5–15)
BUN: 27 mg/dL — AB (ref 8–23)
CO2: 26 mmol/L (ref 22–32)
CREATININE: 0.69 mg/dL (ref 0.61–1.24)
Calcium: 8.6 mg/dL — ABNORMAL LOW (ref 8.9–10.3)
Chloride: 96 mmol/L — ABNORMAL LOW (ref 98–111)
GFR calc Af Amer: >60 mL/min (ref >60)
GLUCOSE: 126 mg/dL — AB (ref 70–99)
POTASSIUM: 4.3 mmol/L (ref 3.5–5.1)
SODIUM: 134 mmol/L — AB (ref 135–145)

## 2018-03-05 LAB — CBC
HEMATOCRIT: 39.5 % (ref 39.0–52.0)
Hemoglobin: 13.1 g/dL (ref 13.0–17.0)
MCH: 29.6 pg (ref 26.0–34.0)
MCHC: 33.2 g/dL (ref 30.0–36.0)
MCV: 89.4 fL (ref 78.0–100.0)
PLATELETS: 494 10*3/uL — AB (ref 150–400)
RBC: 4.42 MIL/uL (ref 4.22–5.81)
RDW: 12.8 % (ref 11.5–15.5)
WBC: 24.8 10*3/uL — AB (ref 4.0–10.5)

## 2018-03-05 LAB — MAGNESIUM: MAGNESIUM: 2 mg/dL (ref 1.7–2.4)

## 2018-03-05 LAB — HEPARIN LEVEL (UNFRACTIONATED): HEPARIN UNFRACTIONATED: 0.19 [IU]/mL — AB (ref 0.30–0.70)

## 2018-03-05 MED ORDER — FOLIC ACID 1 MG PO TABS
1.0000 mg | ORAL_TABLET | Freq: Every day | ORAL | Status: DC
  Administered 2018-03-06 – 2018-03-17 (×12): 1 mg via ORAL
  Filled 2018-03-05 (×13): qty 1

## 2018-03-05 MED ORDER — INFLUENZA VAC SPLIT HIGH-DOSE 0.5 ML IM SUSY
0.5000 mL | PREFILLED_SYRINGE | INTRAMUSCULAR | Status: DC
  Filled 2018-03-05: qty 0.5

## 2018-03-05 MED ORDER — HYDROCHLOROTHIAZIDE 25 MG PO TABS
25.0000 mg | ORAL_TABLET | Freq: Every day | ORAL | Status: DC
  Administered 2018-03-06 – 2018-04-04 (×30): 25 mg via ORAL
  Filled 2018-03-05 (×30): qty 1

## 2018-03-05 MED ORDER — ADULT MULTIVITAMIN W/MINERALS CH
1.0000 | ORAL_TABLET | Freq: Every day | ORAL | Status: DC
  Administered 2018-03-06 – 2018-03-17 (×12): 1 via ORAL
  Filled 2018-03-05 (×13): qty 1

## 2018-03-05 MED ORDER — TAMSULOSIN HCL 0.4 MG PO CAPS: 0.4000 mg | ORAL_CAPSULE | Freq: Every day | ORAL | Status: AC

## 2018-03-05 MED ORDER — CARVEDILOL 6.25 MG PO TABS: 6.2500 mg | ORAL_TABLET | Freq: Two times a day (BID) | ORAL | Status: DC

## 2018-03-05 MED ORDER — VITAMIN B-1 100 MG PO TABS
250.0000 mg | ORAL_TABLET | Freq: Every day | ORAL | Status: DC
  Administered 2018-03-06 – 2018-03-17 (×12): 250 mg via ORAL
  Filled 2018-03-05 (×13): qty 3

## 2018-03-05 MED ORDER — SODIUM CHLORIDE 0.9 % IV SOLN: 1.0000 g | INTRAVENOUS | Status: DC

## 2018-03-05 MED ORDER — APIXABAN 5 MG PO TABS: 5.0000 mg | ORAL_TABLET | Freq: Two times a day (BID) | ORAL | Status: DC

## 2018-03-05 MED ORDER — HYDRALAZINE HCL 50 MG PO TABS
100.0000 mg | ORAL_TABLET | Freq: Three times a day (TID) | ORAL | Status: DC
Start: 2018-03-05 — End: 2018-03-31
  Administered 2018-03-05 – 2018-03-30 (×73): 100 mg via ORAL
  Filled 2018-03-05 (×77): qty 2

## 2018-03-05 MED ORDER — CARVEDILOL 12.5 MG PO TABS
12.5000 mg | ORAL_TABLET | Freq: Two times a day (BID) | ORAL | Status: DC
  Administered 2018-03-05 – 2018-04-04 (×58): 12.5 mg via ORAL
  Filled 2018-03-05 (×61): qty 1

## 2018-03-05 MED ORDER — CARVEDILOL 12.5 MG PO TABS: 12.5000 mg | ORAL_TABLET | Freq: Two times a day (BID) | ORAL | Status: DC

## 2018-03-05 MED ORDER — BENZONATATE 100 MG PO CAPS
200.0000 mg | ORAL_CAPSULE | Freq: Two times a day (BID) | ORAL | Status: DC | PRN
  Administered 2018-03-11: 200 mg via ORAL
  Filled 2018-03-05 (×4): qty 2

## 2018-03-05 MED ORDER — ENSURE ENLIVE PO LIQD
237.0000 mL | Freq: Two times a day (BID) | ORAL | Status: DC
  Administered 2018-03-06 – 2018-04-04 (×56): 237 mL via ORAL

## 2018-03-05 MED ORDER — HEPARIN (PORCINE) IN NACL 100-0.45 UNIT/ML-% IJ SOLN: 1000.0000 [IU]/h | INTRAMUSCULAR | Status: DC

## 2018-03-05 MED ORDER — HEPARIN (PORCINE) IN NACL 100-0.45 UNIT/ML-% IJ SOLN
1600.0000 [IU]/h | INTRAMUSCULAR | Status: DC
  Administered 2018-03-05: 1400 [IU]/h via INTRAVENOUS
  Administered 2018-03-06 – 2018-03-08 (×4): 1600 [IU]/h via INTRAVENOUS
  Filled 2018-03-05 (×4): qty 250

## 2018-03-05 MED ORDER — AMLODIPINE BESYLATE 10 MG PO TABS
10.0000 mg | ORAL_TABLET | Freq: Every day | ORAL | Status: DC
  Administered 2018-03-06 – 2018-04-04 (×30): 10 mg via ORAL
  Filled 2018-03-05 (×31): qty 1

## 2018-03-05 MED ORDER — AMLODIPINE BESYLATE 10 MG PO TABS: 10.0000 mg | ORAL_TABLET | Freq: Every day | ORAL | Status: DC

## 2018-03-05 MED ORDER — TAMSULOSIN HCL 0.4 MG PO CAPS
0.4000 mg | ORAL_CAPSULE | Freq: Every day | ORAL | Status: DC
  Administered 2018-03-06 – 2018-04-04 (×30): 0.4 mg via ORAL
  Filled 2018-03-05 (×30): qty 1

## 2018-03-05 MED ORDER — HYDRALAZINE HCL 100 MG PO TABS: 100.0000 mg | ORAL_TABLET | Freq: Three times a day (TID) | ORAL | Status: DC

## 2018-03-05 MED ORDER — LIP MEDEX EX OINT: 1.0000 "application " | TOPICAL_OINTMENT | CUTANEOUS | Status: DC | PRN

## 2018-03-05 MED ORDER — GUAIFENESIN-DM 100-10 MG/5ML PO SYRP
5.0000 mL | ORAL_SOLUTION | ORAL | Status: DC | PRN
  Administered 2018-03-05 – 2018-04-03 (×30): 5 mL via ORAL
  Filled 2018-03-05 (×33): qty 5

## 2018-03-05 MED ORDER — VITAMIN C 500 MG PO TABS
1000.0000 mg | ORAL_TABLET | Freq: Two times a day (BID) | ORAL | Status: DC
  Administered 2018-03-05 – 2018-04-04 (×60): 1000 mg via ORAL
  Filled 2018-03-05 (×66): qty 2

## 2018-03-05 MED ORDER — DEXAMETHASONE 4 MG PO TABS
4.0000 mg | ORAL_TABLET | Freq: Two times a day (BID) | ORAL | Status: DC
  Administered 2018-03-05: 4 mg via ORAL
  Filled 2018-03-05 (×2): qty 1

## 2018-03-05 NOTE — Progress Notes (Signed)
PT Cancellation Note  Patient Details Name: Wayne Buckley MRN: [id number] DOB: 12/05/1950   Cancelled Treatment:    Reason Eval/Treat Not Completed: Other (comment).  Pt is involved with nursing care and will be leaving shortly for CIR.  Will ck again tomorrow if dc is held for some reason.   Ivar DrapeRuth E Natahsa Marian 03/05/2018, 1:33 PM   Samul Dadauth Lui Bellis, PT MS Acute Rehab Dept. Number: [facility] [phone] and [facility] [phone]

## 2018-03-05 NOTE — Progress Notes (Signed)
ANTICOAGULATION CONSULT NOTE - Initial Consult  Pharmacy Consult for Heparin Indication: DVT  No Known Allergies  Patient Measurements: Height: 5\' 11"  (180.3 cm) Weight: 220 lb 0.3 oz (99.8 kg) IBW/kg (Calculated) : 75.3 Heparin Dosing Weight: 94 kg  Vital Signs: Temp: 98.2 F (36.8 C) (09/17 1310) Temp Source: Oral (09/17 1310) BP: 132/78 (09/17 1310) Pulse Rate: 87 (09/17 1310)  Labs: Recent Labs    03/03/18 0831 03/04/18 0407 03/04/18 0619 03/05/18 0736 03/05/18 1151  HGB 14.2  --  13.5 13.1  --   HCT 42.8  --  40.8 39.5  --   PLT 506*  --  502* 494*  --   HEPARINUNFRC  --   --   --   --  <0.10*  CREATININE 0.68 0.66  --  0.69  --     Estimated Creatinine Clearance: 106.4 mL/min (by C-G formula based on SCr of 0.69 mg/dL).   Medical History: Past Medical History:  Diagnosis Date  . Hypertension   . TBI (traumatic brain injury) [facility](H[facility]) 2013   Assessment:   67 yr old male to begin IV heparin in am on 9/17 for extensive left leg DVT.   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 S/p IVC filter and ACDF surgery on 02/27/18.  Dr. Thedore MinsSingh discussed with Dr. Wynetta Emeryram and conservative anticoagulation to begin on 9/17 am.  No heparin boluses to be given.  Also with hx SDH in 2013 due to fall.  His heparin level came back today at <0.1. He was started out very conservatively. We will increase more now.   Goal of Therapy:  Heparin level 0.3-0.5 units/ml (lower end of therapeutic range) Monitor platelets by anticoagulation protocol: Yes   Plan:   Increase heparin to 1400 units/hr  Heparin level ~6 hrs after drip begins.  Daily heparin level and CBC while on heparin.  Monitor for any s/sx bleeding.  Ulyses SouthwardMinh Ren Aspinall, PharmD, [place]IDP, AAHIVP, CPP Infectious Disease Pharmacist Pager: [phone] 03/05/2018 1:34 PM

## 2018-03-05 NOTE — H&P (Signed)
Physical Medicine and Rehabilitation Admission H&P    Chief Complaint  Patient presents with  . Fall  : HPI: Wayne Buckley is a 67 year old right-handed male with history of alcohol use, hypertension, TBI/SDH 6 years ago craniotomy received inpatient rehab services November 2013.  Per chart review he lives with his spouse.  Reportedly independent had refused assistive devices.  He was driving short distances.  2 level home with bedroom on [address].  Presented 02/25/2018 after a fall x2 with noted lower extremity weakness.  Cranial CT scan showed no acute changes.  Noted chronic encephalomalacia.  Remote left frontal craniotomy as well as remote right occipital skull fracture.  Noted vascular studies lower extremities positive for acute DVT left common femoral vein, femoral vein, profunda vein, popliteal vein peroneal vein posterior tibial veins gastrocnemius vein.  Left external iliac vein also thrombosed.  Underwent placement of IVC filter 02/27/2018 per Dr. Trula Slade.  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  Underwent ACDF and fusion 02/28/2018 per Dr. Saintclair Halsted.  Hospital course pain management.  Decadron protocol as indicated.  Noted leukocytosis secondary to steroids.  Intravenous heparin initiated for extensive left lower extremity DVT postoperatively 03/04/2018 10 plan to begin Eliquis 03/08/2018.  Therapy evaluations completed with recommendations of physical medicine rehab consult.  Patient was admitted for a comprehensive rehab program.  Review of Systems  Constitutional: Negative for chills and fever.  HENT: Negative for hearing loss.   Eyes: Negative for blurred vision and double vision.  Respiratory: Negative for cough and shortness of breath.   Cardiovascular: Positive for leg swelling. Negative for chest pain and palpitations.  Gastrointestinal: Positive for constipation. Negative for nausea and vomiting.  Genitourinary: Positive for urgency. Negative for dysuria, flank pain  and hematuria.  Musculoskeletal: Positive for back pain, falls and myalgias.  Skin: Negative for rash.  Neurological: Positive for tingling, speech change, focal weakness and headaches.  All other systems reviewed and are negative.  Past Medical History:  Diagnosis Date  . Hypertension   . TBI (traumatic brain injury) ([place]) 2013   Past Surgical History:  Procedure Laterality Date  . ANTERIOR CERVICAL DECOMP/DISCECTOMY FUSION N/A 02/27/2018   Procedure: ANTERIOR CERVICAL DECOMPRESSION/DISCECTOMY FUSION C6-7;  Surgeon: Kary Kos, MD;  Location: [place];  Service: Neurosurgery;  Laterality: N/A;  . CRANIOTOMY  04/28/2012   Procedure: CRANIOTOMY HEMATOMA EVACUATION SUBDURAL;  Surgeon: Faythe Ghee, MD;  Location: [place];  Service: Neurosurgery;  Laterality: Left;  Left Craniotomy for Subdural Hematoma  . RADIOLOGY WITH ANESTHESIA N/A 02/27/2018   Procedure: MRI WITH ANESTHESIA;  Surgeon: Radiologist, Medication, MD;  Location: [place];  Service: Radiology;  Laterality: N/A;  . VENA CAVA FILTER PLACEMENT Right 02/27/2018   Procedure: INSERTION VENA-CAVA FILTER;  Surgeon: Serafina Mitchell, MD;  Location: [facility];  Service: Vascular;  Laterality: Right;   Family History  Problem Relation Age of Onset  . Heart disease Father    Social History:  reports that he has never smoked. He has never used smokeless tobacco. He reports that he drinks alcohol. His drug history is not on file. Allergies: No Known Allergies Medications Prior to Admission  Medication Sig Dispense Refill  . Ascorbic Acid (VITAMIN C) 1000 MG tablet Take 1,000 mg by mouth daily.     Marland Kitchen aspirin EC 325 MG tablet Take 325 mg by mouth 2 (two) times daily.    . Multiple Vitamin (MULTIVITAMIN WITH MINERALS) TABS Take 1 tablet by mouth daily.    Marland Kitchen  feeding supplement (ENSURE COMPLETE) LIQD Take 237 mLs by mouth 2 (two) times daily between meals. (Patient not taking: Reported on 02/26/2018)    . folic acid (FOLVITE) 1 MG tablet Take 1 tablet  (1 mg total) by mouth daily. (Patient not taking: Reported on 02/26/2018) 30 tablet 1  . pantoprazole (PROTONIX) 40 MG tablet Take 1 tablet (40 mg total) by mouth daily at 12 noon. (Patient not taking: Reported on 02/26/2018) 30 tablet 1  . thiamine 250 MG tablet Take 1 tablet (250 mg total) by mouth daily. (Patient not taking: Reported on 02/26/2018) 30 tablet 1    Drug Regimen Review Drug regimen was reviewed and remains appropriate with no significant issues identified  Home: Home Living Family/patient expects to be discharged to:: Private residence Living Arrangements: Spouse/significant other Available Help at Discharge: Family, Available 24 hours/day Type of Home: House Home Access: Stairs to enter [employer] of Steps: 4 Home Layout: Two level, Able to live on main level with bedroom/bathroom Bathroom Shower/Tub: Multimedia programmer: Standard Home Equipment: Environmental consultant - 2 wheels, [place] - single point   Functional History: Prior Function Level of Independence: Independent Comments: refused to use any device for ambulation, pt was driving, retired Biomedical scientist, wife describes pt as "stubborn"  Functional Status:  Mobility: Bed Mobility Overal bed mobility: Needs Assistance Bed Mobility: Rolling, Sit to Supine Rolling: Mod assist, +2 for safety/equipment Supine to sit: Max assist, +2 for physical assistance Sit to supine: Mod assist, +2 for physical assistance General bed mobility comments: Mod A to roll left and right x 2 each side, able to move RLE better than LLE to bend/cross to roll--once on his side difficult to maintain due to choreoathetosis; A for legs and trunk for up to sit EOB from right side lying Transfers Overall transfer level: Needs assistance Transfer via Lift Equipment: [place] transfer comment: Deferred.      ADL: ADL Overall ADL's : Needs assistance/impaired Eating/Feeding: Supervision/ safety, Set up, Sitting Grooming: Moderate  assistance, Sitting Upper Body Bathing: Maximal assistance, Sitting Lower Body Bathing: Total assistance, Bed level Upper Body Dressing : Maximal assistance, Sitting Lower Body Dressing: Total assistance, Bed level Functional mobility during ADLs: (requires lift)  Cognition: Cognition Overall Cognitive Status: Impaired/Different from baseline Orientation Level: Oriented X4 Cognition Arousal/Alertness: Awake/alert Behavior During Therapy: Impulsive Overall Cognitive Status: Impaired/Different from baseline Area of Impairment: Problem solving Orientation Level: Disoriented to, Time(new date) Current Attention Level: Focused Memory: Decreased short-term memory Following Commands: Follows one step commands consistently Safety/Judgement: Decreased awareness of safety, Decreased awareness of deficits Awareness: Intellectual Problem Solving: Requires verbal cues General Comments: pt in sitting leaning to right (sometimes needed VCs to correct and other times he self-corrected  Physical Exam: Blood pressure 134/85, pulse 82, temperature 98 F (36.7 C), temperature source Oral, resp. rate 17, height 5' 11" (1.803 m), weight 99.8 kg, SpO2 98 %. Physical Exam  Vitals reviewed. Constitutional: He is oriented to person, place, and time. He appears well-nourished.  67 year old right-handed male  HENT:  Head: Normocephalic and atraumatic.  Eyes: Pupils are equal, round, and reactive to light. EOM are normal.  Neck: No tracheal deviation present. No thyromegaly present.  Surgical dressing in place  Cardiovascular: Normal rate, regular rhythm, normal heart sounds and intact distal pulses. Exam reveals no friction rub.  No murmur heard. Respiratory: Effort normal. No respiratory distress. He has no wheezes. He has no rales.  GI: Soft. He exhibits no distension. There is no tenderness.  Musculoskeletal:  Left  lower extremity with 2+ edema, slightly warm and mildly erythematous  Neurological:  He is alert and oriented to person, place, and time.  Biceps and deltoids are 5 out of 5.  Triceps 3 out of 5 on right 3+ out of 5 on the left wrist extensors 4+ out of 5 on right and left.  And intrinsics 3 to 3+ out of 5.  Right lower extremity grossly 4 out of 5 proximal distal.  Left lower extremity is able to move at all levels but is limited due to pain and swelling.  Patient is very distractible and appears impulsive.  Sensory function seems to be decreased to light touch below the level of his injury.  May have increase in resting tone in the left lower extremity but difficult to discern given the swelling and impulsivity.  Skin: Skin is warm.  Psychiatric:  Patient pleasant but impulsive.  Can be redirected.      Results for orders placed or performed during the hospital encounter of 02/25/18 (from the past 48 hour(s))  Basic metabolic panel     Status: Abnormal   Collection Time: 03/03/18  8:31 AM  Result Value Ref Range   Sodium 135 135 - 145 mmol/L   Potassium 4.1 3.5 - 5.1 mmol/L   Chloride 98 98 - 111 mmol/L   CO2 26 22 - 32 mmol/L   Glucose, Bld 138 (H) 70 - 99 mg/dL   BUN 27 (H) 8 - 23 mg/dL   Creatinine, Ser 0.68 0.61 - 1.24 mg/dL   Calcium 8.9 8.9 - 10.3 mg/dL   GFR calc non Af Amer >60 >60 mL/min   GFR calc Af Amer >60 >60 mL/min    Comment: (NOTE) The eGFR has been calculated using the CKD EPI equation. This calculation has not been validated in all clinical situations. eGFR's persistently <60 mL/min signify possible Chronic Kidney Disease.    Anion gap 11 5 - 15    Comment: Performed at [facility] [address] [place], [place] [address]  CBC     Status: Abnormal   Collection Time: 03/03/18  8:31 AM  Result Value Ref Range   WBC 19.9 (H) 4.0 - 10.5 K/uL   RBC 4.80 4.22 - 5.81 MIL/uL   Hemoglobin 14.2 13.0 - 17.0 g/dL   HCT 42.8 39.0 - 52.0 %   MCV 89.2 78.0 - 100.0 fL   MCH 29.6 26.0 - 34.0 pg   MCHC 33.2 30.0 - 36.0 g/dL   RDW 12.6 11.5 - 15.5 %    Platelets 506 (H) 150 - 400 K/uL    Comment: Performed at [facility], [place] [address], [place], [place] [address]  Basic metabolic panel     Status: Abnormal   Collection Time: 03/04/18  4:07 AM  Result Value Ref Range   Sodium 134 (L) 135 - 145 mmol/L   Potassium 4.1 3.5 - 5.1 mmol/L   Chloride 99 98 - 111 mmol/L   CO2 25 22 - 32 mmol/L   Glucose, Bld 129 (H) 70 - 99 mg/dL   BUN 28 (H) 8 - 23 mg/dL   Creatinine, Ser 0.66 0.61 - 1.24 mg/dL   Calcium 8.6 (L) 8.9 - 10.3 mg/dL   GFR calc non Af Amer >60 >60 mL/min   GFR calc Af Amer >60 >60 mL/min    Comment: (NOTE) The eGFR has been calculated using the CKD EPI equation. This calculation has not been validated in all clinical situations. eGFR's  persistently <60 mL/min signify possible Chronic Kidney Disease.    Anion gap 10 5 - 15    Comment: Performed at [facility] [address], [place], [place] [address]  CBC     Status: Abnormal   Collection Time: 03/04/18  6:19 AM  Result Value Ref Range   WBC 25.9 (H) 4.0 - 10.5 K/uL   RBC 4.60 4.22 - 5.81 MIL/uL   Hemoglobin 13.5 13.0 - 17.0 g/dL   HCT 40.8 39.0 - 52.0 %   MCV 88.7 78.0 - 100.0 fL   MCH 29.3 26.0 - 34.0 pg   MCHC 33.1 30.0 - 36.0 g/dL   RDW 12.6 11.5 - 15.5 %   Platelets 502 (H) 150 - 400 K/uL    Comment: Performed at [facility], [place] [address], [place],  [address]   Dg Chest Port 1 View  Result Date: 03/03/2018 CLINICAL DATA:  Congestion, shortness of breath EXAM: PORTABLE CHEST 1 VIEW COMPARISON:  02/25/2018 FINDINGS: Lungs are clear.  No pleural effusion or pneumothorax. The heart is normal in size. IMPRESSION: No evidence of acute cardiopulmonary disease. Electronically Signed   By: Julian Hy M.D.   On: 03/03/2018 13:06       Medical Problem List and Plan: 1.  Decreased functional mobility secondary to C6-7 cord compression.S/P ACDF C6-7 02/28/2018 as well as history of TBI/SDH 2013  -Admit to inpatient rehab 2.  DVT  Prophylaxis/Anticoagulation: Extensive left lower extremity DVT status post IVC filter 02/27/2018.  IV heparin initiated 03/04/2018 transitioning to Eliquis beginning 03/08/2018  -Local edema control left lower extremity 3. Pain Management: Tylenol as needed 4. Mood: Provide emotional support 5. Neuropsych: This patient is capable of making decisions on his own behalf. 6. Skin/Wound Care: Routine skin checks 7. Fluids/Electrolytes/Nutrition: Routine in and outs with follow-up chemistries ordered 8.  Leukocytosis.  Steroid-induced.  Urine study negative 9.  Hypertension.  HCTZ 25 mg daily, Coreg 12.5 mg twice daily, Norvasc 10 mg daily, hydralazine 100 mg every 8 hours.  Monitor for orthostatic hypotension 10.  BPH/neurogenic bladder  -  Flomax 0.4 mg daily  -Leave Foley catheter in for tonight  -Consider voiding trial in the morning  -Initial education was provided today 11.  ?neurogenic bowel:  -follow for bowel patterns. 12.  History of alcohol use.  Provide counseling    Post Admission Physician Evaluation: 1. 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 2. Patient is admitted to receive collaborative, interdisciplinary care between the physiatrist, rehab nursing staff, and therapy team. 3. Patient's level of medical complexity and substantial therapy needs in context of that medical necessity cannot be provided at a lesser intensity of care such as a SNF. 4. Patient has experienced substantial functional loss from his/her baseline which was documented above under the "Functional History" and "Functional Status" headings.  Judging by the patient's diagnosis, physical exam, and functional history, the patient has potential for functional progress which will result in measurable gains while on inpatient rehab.  These gains will be of substantial and practical use upon discharge  in facilitating mobility and self-care at the household level. 5. Physiatrist will provide 24 hour management of  medical needs as well as oversight of the therapy plan/treatment and provide guidance as appropriate regarding the interaction of the two. 6. The Preadmission Screening has been reviewed and patient status is unchanged unless otherwise stated above. 7. 24 hour rehab nursing will assist with bladder management, bowel management, safety, skin/wound care, disease management,  medication administration, pain management and patient education  and help integrate therapy concepts, techniques,education, etc. PT will assess and treat for/with: .Lower extremity strength, range of motion, stamina, balance, functional mobility, safety, adaptive techniques and equipment, neuromuscular reeducation, spinal cord injury education.  Pain control.   Goals are: Supervision to minimal assistance. 8. OT will assess and treat for/with: ADL's, functional mobility, safety, upper extremity strength, adaptive techniques and equipment, neuromuscular reeducation, ego support, family education.   Goals are: Supervision to minimal assistance. Therapy may proceed with showering this patient. 9. SLP will assess and treat for/with: Cognition, behavior, family education, safety awareness.  Goals are: Modified independent. 10. Case Management and Social Worker will assess and treat for psychological issues and discharge planning. 11. Team conference will be held weekly to assess progress toward goals and to determine barriers to discharge. 12. Patient will receive at least 3 hours of therapy per day at least 5 days per week. 13. ELOS: 20-30 days       14. Prognosis:  excellent   I have personally performed a face to face diagnostic evaluation of this patient and formulated the key components of the plan.  Additionally, I have personally reviewed laboratory data, imaging studies, as well as relevant notes and concur with the physician assistant's documentation above.  At least 70 minutes today was spent in direct review of patient's chart,  examination of the patient and counseling.  Meredith Staggers, MD, FAAPMR    Lavon Paganini [place], PA-C 03/05/2018

## 2018-03-05 NOTE — H&P (Signed)
Physical Medicine and Rehabilitation Admission H&P       Chief Complaint  Patient presents with  . Fall  : HPI: Wayne Buckley is a 67 year old right-handed male with history of alcohol use, hypertension, TBI/SDH 6 years ago craniotomy received inpatient rehab services November 2013.  Per chart review he lives with his spouse.  Reportedly independent had refused assistive devices.  He was driving short distances.  2 level home with bedroom on [address].  Presented 02/25/2018 after a fall x2 with noted lower extremity weakness.  Cranial CT scan showed no acute changes.  Noted chronic encephalomalacia.  Remote left frontal craniotomy as well as remote right occipital skull fracture.  Noted vascular studies lower extremities positive for acute DVT left common femoral vein, femoral vein, profunda vein, popliteal vein peroneal vein posterior tibial veins gastrocnemius vein.  Left external iliac vein also thrombosed.  Underwent placement of IVC filter 02/27/2018 per Dr. Trula Slade.  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  Underwent ACDF and fusion 02/28/2018 per Dr. Saintclair Halsted.  Hospital course pain management.  Decadron protocol as indicated.  Noted leukocytosis secondary to steroids.  Intravenous heparin initiated for extensive left lower extremity DVT postoperatively 03/04/2018 10 plan to begin Eliquis 03/08/2018.  Therapy evaluations completed with recommendations of physical medicine rehab consult.  Patient was admitted for a comprehensive rehab program.  Review of Systems  Constitutional: Negative for chills and fever.  HENT: Negative for hearing loss.   Eyes: Negative for blurred vision and double vision.  Respiratory: Negative for cough and shortness of breath.   Cardiovascular: Positive for leg swelling. Negative for chest pain and palpitations.  Gastrointestinal: Positive for constipation. Negative for nausea and vomiting.  Genitourinary: Positive for urgency. Negative for dysuria, flank  pain and hematuria.  Musculoskeletal: Positive for back pain, falls and myalgias.  Skin: Negative for rash.  Neurological: Positive for tingling, speech change, focal weakness and headaches.  All other systems reviewed and are negative.      Past Medical History:  Diagnosis Date  . Hypertension   . TBI (traumatic brain injury) ([place]) 2013        Past Surgical History:  Procedure Laterality Date  . ANTERIOR CERVICAL DECOMP/DISCECTOMY FUSION N/A 02/27/2018   Procedure: ANTERIOR CERVICAL DECOMPRESSION/DISCECTOMY FUSION C6-7;  Surgeon: Kary Kos, MD;  Location: [facility];  Service: Neurosurgery;  Laterality: N/A;  . CRANIOTOMY  04/28/2012   Procedure: CRANIOTOMY HEMATOMA EVACUATION SUBDURAL;  Surgeon: Faythe Ghee, MD;  Location: [facility];  Service: Neurosurgery;  Laterality: Left;  Left Craniotomy for Subdural Hematoma  . RADIOLOGY WITH ANESTHESIA N/A 02/27/2018   Procedure: MRI WITH ANESTHESIA;  Surgeon: Radiologist, Medication, MD;  Location: [facility];  Service: Radiology;  Laterality: N/A;  . VENA CAVA FILTER PLACEMENT Right 02/27/2018   Procedure: INSERTION VENA-CAVA FILTER;  Surgeon: Serafina Mitchell, MD;  Location: [facility];  Service: Vascular;  Laterality: Right;        Family History  Problem Relation Age of Onset  . Heart disease Father    Social History:  reports that he has never smoked. He has never used smokeless tobacco. He reports that he drinks alcohol. His drug history is not on file. Allergies: No Known Allergies       Medications Prior to Admission  Medication Sig Dispense Refill  . Ascorbic Acid (VITAMIN C) 1000 MG tablet Take 1,000 mg by mouth daily.     Marland Kitchen aspirin EC 325 MG tablet Take 325 mg by mouth 2 (  two) times daily.    . Multiple Vitamin (MULTIVITAMIN WITH MINERALS) TABS Take 1 tablet by mouth daily.    . feeding supplement (ENSURE COMPLETE) LIQD Take 237 mLs by mouth 2 (two) times daily between meals. (Patient not taking: Reported on 02/26/2018)      . folic acid (FOLVITE) 1 MG tablet Take 1 tablet (1 mg total) by mouth daily. (Patient not taking: Reported on 02/26/2018) 30 tablet 1  . pantoprazole (PROTONIX) 40 MG tablet Take 1 tablet (40 mg total) by mouth daily at 12 noon. (Patient not taking: Reported on 02/26/2018) 30 tablet 1  . thiamine 250 MG tablet Take 1 tablet (250 mg total) by mouth daily. (Patient not taking: Reported on 02/26/2018) 30 tablet 1    Drug Regimen Review Drug regimen was reviewed and remains appropriate with no significant issues identified  Home: Home Living Family/patient expects to be discharged to:: Private residence Living Arrangements: Spouse/significant other Available Help at Discharge: Family, Available 24 hours/day Type of Home: House Home Access: Stairs to enter [employer] of Steps: 4 Home Layout: Two level, Able to live on main level with bedroom/bathroom Bathroom Shower/Tub: Multimedia programmer: Standard Home Equipment: Environmental consultant - 2 wheels, [place] - single point   Functional History: Prior Function Level of Independence: Independent Comments: refused to use any device for ambulation, pt was driving, retired Biomedical scientist, wife describes pt as "stubborn"  Functional Status:  Mobility: Bed Mobility Overal bed mobility: Needs Assistance Bed Mobility: Rolling, Sit to Supine Rolling: Mod assist, +2 for safety/equipment Supine to sit: Max assist, +2 for physical assistance Sit to supine: Mod assist, +2 for physical assistance General bed mobility comments: Mod A to roll left and right x 2 each side, able to move RLE better than LLE to bend/cross to roll--once on his side difficult to maintain due to choreoathetosis; A for legs and trunk for up to sit EOB from right side lying Transfers Overall transfer level: Needs assistance Transfer via Lift Equipment: [place] transfer comment: Deferred.  ADL: ADL Overall ADL's : Needs assistance/impaired Eating/Feeding:  Supervision/ safety, Set up, Sitting Grooming: Moderate assistance, Sitting Upper Body Bathing: Maximal assistance, Sitting Lower Body Bathing: Total assistance, Bed level Upper Body Dressing : Maximal assistance, Sitting Lower Body Dressing: Total assistance, Bed level Functional mobility during ADLs: (requires lift)  Cognition: Cognition Overall Cognitive Status: Impaired/Different from baseline Orientation Level: Oriented X4 Cognition Arousal/Alertness: Awake/alert Behavior During Therapy: Impulsive Overall Cognitive Status: Impaired/Different from baseline Area of Impairment: Problem solving Orientation Level: Disoriented to, Time(new date) Current Attention Level: Focused Memory: Decreased short-term memory Following Commands: Follows one step commands consistently Safety/Judgement: Decreased awareness of safety, Decreased awareness of deficits Awareness: Intellectual Problem Solving: Requires verbal cues General Comments: pt in sitting leaning to right (sometimes needed VCs to correct and other times he self-corrected  Physical Exam: Blood pressure 134/85, pulse 82, temperature 98 F (36.7 C), temperature source Oral, resp. rate 17, height 5' 11"  (1.803 m), weight 99.8 kg, SpO2 98 %. Physical Exam  Vitals reviewed. Constitutional: He is oriented to person, place, and time. He appears well-nourished.  67 year old right-handed male  HENT:  Head: Normocephalic and atraumatic.  Eyes: Pupils are equal, round, and reactive to light. EOM are normal.  Neck: No tracheal deviation present. No thyromegaly present.  Surgical dressing in place  Cardiovascular: Normal rate, regular rhythm, normal heart sounds and intact distal pulses. Exam reveals no friction rub.  No murmur heard. Respiratory: Effort normal. No respiratory distress. He has no  wheezes. He has no rales.  GI: Soft. He exhibits no distension. There is no tenderness.  Musculoskeletal:  Left lower extremity with 2+  edema, slightly warm and mildly erythematous  Neurological: He is alert and oriented to person, place, and time.  Biceps and deltoids are 5 out of 5.  Triceps 3 out of 5 on right 3+ out of 5 on the left wrist extensors 4+ out of 5 on right and left.  And intrinsics 3 to 3+ out of 5.  Right lower extremity grossly 4 out of 5 proximal distal.  Left lower extremity is able to move at all levels but is limited due to pain and swelling.  Patient is very distractible and appears impulsive.  Sensory function seems to be decreased to light touch below the level of his injury.  May have increase in resting tone in the left lower extremity but difficult to discern given the swelling and impulsivity.  Skin: Skin is warm.  Psychiatric:  Patient pleasant but impulsive.  Can be redirected.      LabResultsLast48Hours  Results for orders placed or performed during the hospital encounter of 02/25/18 (from the past 48 hour(s))  Basic metabolic panel     Status: Abnormal   Collection Time: 03/03/18  8:31 AM  Result Value Ref Range   Sodium 135 135 - 145 mmol/L   Potassium 4.1 3.5 - 5.1 mmol/L   Chloride 98 98 - 111 mmol/L   CO2 26 22 - 32 mmol/L   Glucose, Bld 138 (H) 70 - 99 mg/dL   BUN 27 (H) 8 - 23 mg/dL   Creatinine, Ser 0.68 0.61 - 1.24 mg/dL   Calcium 8.9 8.9 - 10.3 mg/dL   GFR calc non Af Amer >60 >60 mL/min   GFR calc Af Amer >60 >60 mL/min    Comment: (NOTE) The eGFR has been calculated using the CKD EPI equation. This calculation has not been validated in all clinical situations. eGFR's persistently <60 mL/min signify possible Chronic Kidney Disease.    Anion gap 11 5 - 15    Comment: Performed at [facility] [address], [place], [place] [address]  CBC     Status: Abnormal   Collection Time: 03/03/18  8:31 AM  Result Value Ref Range   WBC 19.9 (H) 4.0 - 10.5 K/uL   RBC 4.80 4.22 - 5.81 MIL/uL   Hemoglobin 14.2 13.0 - 17.0 g/dL   HCT 42.8 39.0 - 52.0  %   MCV 89.2 78.0 - 100.0 fL   MCH 29.6 26.0 - 34.0 pg   MCHC 33.2 30.0 - 36.0 g/dL   RDW 12.6 11.5 - 15.5 %   Platelets 506 (H) 150 - 400 K/uL    Comment: Performed at [facility], [place] [address], [place],  [address]  Basic metabolic panel     Status: Abnormal   Collection Time: 03/04/18  4:07 AM  Result Value Ref Range   Sodium 134 (L) 135 - 145 mmol/L   Potassium 4.1 3.5 - 5.1 mmol/L   Chloride 99 98 - 111 mmol/L   CO2 25 22 - 32 mmol/L   Glucose, Bld 129 (H) 70 - 99 mg/dL   BUN 28 (H) 8 - 23 mg/dL   Creatinine, Ser 0.66 0.61 - 1.24 mg/dL   Calcium 8.6 (L) 8.9 - 10.3 mg/dL   GFR calc non Af Amer >60 >60 mL/min   GFR calc Af Amer >60 >60 mL/min    Comment: (  NOTE) The eGFR has been calculated using the CKD EPI equation. This calculation has not been validated in all clinical situations. eGFR's persistently <60 mL/min signify possible Chronic Kidney Disease.    Anion gap 10 5 - 15    Comment: Performed at [facility] [address] [place], [place] [address]  CBC     Status: Abnormal   Collection Time: 03/04/18  6:19 AM  Result Value Ref Range   WBC 25.9 (H) 4.0 - 10.5 K/uL   RBC 4.60 4.22 - 5.81 MIL/uL   Hemoglobin 13.5 13.0 - 17.0 g/dL   HCT 40.8 39.0 - 52.0 %   MCV 88.7 78.0 - 100.0 fL   MCH 29.3 26.0 - 34.0 pg   MCHC 33.1 30.0 - 36.0 g/dL   RDW 12.6 11.5 - 15.5 %   Platelets 502 (H) 150 - 400 K/uL    Comment: Performed at [facility], [place] [address], [place], [place] [address]      ImagingResults(Last48hours)  Dg Chest Port 1 View  Result Date: 03/03/2018 CLINICAL DATA:  Congestion, shortness of breath EXAM: PORTABLE CHEST 1 VIEW COMPARISON:  02/25/2018 FINDINGS: Lungs are clear.  No pleural effusion or pneumothorax. The heart is normal in size. IMPRESSION: No evidence of acute cardiopulmonary disease. Electronically Signed   By: Julian Hy M.D.   On: 03/03/2018 13:06         Medical Problem List and Plan: 1.  Decreased functional mobility secondary to C6-7 cord compression.S/P ACDF C6-7 02/28/2018 as well as history of TBI/SDH 2013             -Admit to inpatient rehab 2.  DVT Prophylaxis/Anticoagulation: Extensive left lower extremity DVT status post IVC filter 02/27/2018.  IV heparin initiated 03/04/2018 transitioning to Eliquis beginning 03/08/2018             -Local edema control left lower extremity 3. Pain Management: Tylenol as needed 4. Mood: Provide emotional support 5. Neuropsych: This patient is capable of making decisions on his own behalf. 6. Skin/Wound Care: Routine skin checks 7. Fluids/Electrolytes/Nutrition: Routine in and outs with follow-up chemistries ordered 8.  Leukocytosis.  Steroid-induced.  Urine study negative 9.  Hypertension.  HCTZ 25 mg daily, Coreg 12.5 mg twice daily, Norvasc 10 mg daily, hydralazine 100 mg every 8 hours.  Monitor for orthostatic hypotension 10.  BPH/neurogenic bladder             -  Flomax 0.4 mg daily             -Leave Foley catheter in for tonight             -Consider voiding trial in the morning             -Initial education was provided today 11.  ?neurogenic bowel:             -follow for bowel patterns. 12.  History of alcohol use.  Provide counseling    Post Admission Physician Evaluation: 1. 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 2. Patient is admitted to receive collaborative, interdisciplinary care between the physiatrist, rehab nursing staff, and therapy team. 3. Patient's level of medical complexity and substantial therapy needs in context of that medical necessity cannot be provided at a lesser intensity of care such as a SNF. 4. Patient has experienced substantial functional loss from his/her baseline which was documented above under the "Functional History" and "Functional Status" headings.  Judging by the patient's diagnosis, physical exam,  and functional history, the  patient has potential for functional progress which will result in measurable gains while on inpatient rehab.  These gains will be of substantial and practical use upon discharge  in facilitating mobility and self-care at the household level. 5. Physiatrist will provide 24 hour management of medical needs as well as oversight of the therapy plan/treatment and provide guidance as appropriate regarding the interaction of the two. 6. The Preadmission Screening has been reviewed and patient status is unchanged unless otherwise stated above. 7. 24 hour rehab nursing will assist with bladder management, bowel management, safety, skin/wound care, disease management, medication administration, pain management and patient education  and help integrate therapy concepts, techniques,education, etc. 23. PT will assess and treat for/with: .Lower extremity strength, range of motion, stamina, balance, functional mobility, safety, adaptive techniques and equipment, neuromuscular reeducation, spinal cord injury education.  Pain control.   Goals are: Supervision to minimal assistance. 8. OT will assess and treat for/with: ADL's, functional mobility, safety, upper extremity strength, adaptive techniques and equipment, neuromuscular reeducation, ego support, family education.   Goals are: Supervision to minimal assistance. Therapy may proceed with showering this patient. 9. SLP will assess and treat for/with: Cognition, behavior, family education, safety awareness.  Goals are: Modified independent. 10. Case Management and Social Worker will assess and treat for psychological issues and discharge planning. 11. Team conference will be held weekly to assess progress toward goals and to determine barriers to discharge. 12. Patient will receive at least 3 hours of therapy per day at least 5 days per week. 13. ELOS: 20-30 days       14. Prognosis:  excellent   I have personally performed a face to face diagnostic evaluation of  this patient and formulated the key components of the plan.  Additionally, I have personally reviewed laboratory data, imaging studies, as well as relevant notes and concur with the physician assistant's documentation above.  At least 70 minutes today was spent in direct review of patient's chart, examination of the patient and counseling regarding treatment plan and prognosis.  Meredith Staggers, MD, FAAPMR    Lavon Paganini Merrill, PA-C 03/05/2018  The patient's status has not changed. The original post admission physician evaluation remains appropriate, and any changes from the pre-admission screening or documentation from the acute chart are noted above.  Meredith Staggers, MD 03/05/2018

## 2018-03-05 NOTE — Progress Notes (Signed)
Patient received at 1455 in bed alert and oriented x 4. No complaint of pain. Foley catheter intact . Heparin infusing at 14cc/hour. Oriented patient and wife to room and call bell system. Patient oriented to rehab process and safety protocols. Patient and wife verbalized understanding of admission process. Continue with plan of care.  Wayne NeerJoyce, Nnamdi Dacus S

## 2018-03-05 NOTE — Progress Notes (Signed)
Inpatient Rehabilitation  Met with patient and spouse, Langley Gauss at bedside to discuss team's recommendation for IP Rehab.  Shared booklets, insurance verification letter, and answered questions.  They are familiar with our rehab program from their stay in 2013 following his brain injury, when Dr. Naaman Plummer was over seeing his care, and are in favor of that again.  I have received medical clearance for admission today and have a bed to offer  Plan to proceed with admission today.  Call if questions.  Carmelia Roller., CCC/SLP Admission Coordinator  [facility]  Cell [phone]

## 2018-03-05 NOTE — Discharge Summary (Signed)
Wayne Buckley WUJ:[id number] DOB: 11/07/1950 DOA: 02/25/2018  PCP: Daisy Florooss, Charles Alan, MD  Admit date: 02/25/2018  Discharge date: 03/05/2018  Admitted From: Home  Disposition:  CIR   Recommendations for Outpatient Follow-up:   Follow up with PCP in 1-2 weeks  PCP Please obtain BMP/CBC, 2 view CXR in 1week,  (see Discharge instructions)   PCP Please follow up on the following pending results:    Home Health: None   Equipment/Devices: None  Consultations: N.Surg Discharge Condition: Fair   CODE STATUS: Full   Diet Recommendation: Soft diet with full feeding assistance and aspiration precautions.  Chief Complaint  Patient presents with  . Fall     Brief history of present illness from the day of admission and additional interim summary    Wayne Buckley is a 67 y.o. male with a history of TBI with persistent AMS, EtOH abuse who presented to the ED for 2 falls at home, after the second fall he was unable to move his legs. He was found to have LLE DVT and large disc herniation at C6-7. IVC filter placed 9/11 prior to ACDF by Dr. Wynetta Emeryram. He was agitated postextubation, monitored in the ICU, and has since normalized. Anticoagulation is to be held per neurosurgery for now, and CIR is consulted for rehabilitation admission.                                                                 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 Wynetta Emeryram : His initial MRI was delayed due to underlying choreoathetosis, lower extremity weakness has improved, have stopped Decadron on 03/05/2018 continue to monitor, continue PT OT and pain control.  Discussed with Dr. Wynetta Emeryram on 03/04/2018.  Extensive left lower extremity DVT: Involving the common femoral femoral, femoral, profunda,  popliteal , peroneal, posterior tibial, gastrocnemius, left external iliac.  Due to recent C-spine surgery as above for now IVC filter was placed, cussed with neurosurgeon Dr. Wynetta Emeryram on 03/04/2018, at this time he has been started on heparin drip without bolus on 03/05/2018, continue for 48 hours thereafter Eliquis can be started on the pharmacy monitoring on or after 03/08/2018.   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  The catheter was removed on 03/02/2018 unfortunately he developed urinary retention again, placed on Flomax Foley reinserted on 03/04/2018 we will continue to monitor.  ARF has resolved.  TBI 2013 s/p traumatic SDH s/p left frontotemporal parietal craniotomy:   With intermittent confusion and choreoathetosis. At mental baseline per wife.   HTN: Currently on combination of Coreg, Norvasc and hydralazine,    Continue to monitor and adjust  PMH alcohol abuse: Per wife "occasionally drinks a beer".  Continue supplementation with thiamine and folic acid.  Non specific leukocytosis.  Borderline UA.  5 days of Rocephin total with a stop date of 03/07/2018, leukocytosis likely reactionary due to DVT which was till now not treated with any anticoagulation, treat DVT with anticoagulation, monitor clinically.  Afebrile.  Chest x-ray unremarkable.  Symptom-free.   Discharge diagnosis     Principal Problem:   Bilateral leg weakness Active Problems:   Subdural hematoma, post-traumatic (HCC)   Urinary retention   Fall   AKI (acute kidney injury) (HCC)   Leg DVT (deep venous thromboembolism), acute, left [facility])    Discharge instructions    Discharge Instructions    Discharge instructions   Complete by:  As directed    Follow with Primary MD [name], MD in 7 days   Get CBC, CMP, 2 view Chest X ray checked  by Rehab MD in 5-7 days    Activity: As tolerated with Full fall precautions use walker/cane & assistance as  needed  Disposition CIR  Diet:   Soft diet with full feeding assistance and aspiration precautions.  For Heart failure patients - Check your Weight same time everyday, if you gain over 2 pounds, or you develop in leg swelling, experience more shortness of breath or chest pain, call your Primary MD immediately. Follow Cardiac Low Salt Diet and 1.5 lit/day fluid restriction.  Special Instructions: If you have smoked or chewed Tobacco  in the last 2 yrs please stop smoking, stop any regular Alcohol  and or any Recreational drug use.  On your next visit with your primary care physician please Get Medicines reviewed and adjusted.  Please request your Prim.MD to go over all Hospital Tests and Procedure/Radiological results at the follow up, please get all Hospital records sent to your Prim MD by signing hospital release before you go home.  If you experience worsening of your admission symptoms, develop shortness of breath, life threatening emergency, suicidal or homicidal thoughts you must seek medical attention immediately by calling 911 or calling your MD immediately  if symptoms less severe.  You Must read complete instructions/literature along with all the possible adverse reactions/side effects for all the Medicines you take and that have been prescribed to you. Take any new Medicines after you have completely understood and accpet all the possible adverse reactions/side effects.   Increase activity slowly   Complete by:  As directed       Discharge Medications   Allergies as of 03/05/2018   No Known Allergies     Medication List    STOP taking these medications   aspirin EC 325 MG tablet     TAKE these medications   amLODipine 10 MG tablet Commonly known as:  NORVASC Take 1 tablet (10 mg total) by mouth daily. Start taking on:  03/06/2018   carvedilol 6.25 MG tablet Commonly known as:  COREG Take 1 tablet (6.25 mg total) by mouth 2 (two) times daily with a meal.    cefTRIAXone 1 g in sodium chloride 0.9 % 100 mL Inject 1 g into the vein daily for 1 day. Stop date 03/07/2018. Start taking on:  03/06/2018   feeding supplement (ENSURE COMPLETE) Liqd Take 237 mLs by mouth 2 (two) times daily between meals.   folic acid 1 MG tablet Commonly known as:  FOLVITE Take 1 tablet (1 mg total) by mouth daily.   heparin 100-0.45 UNIT/ML-% infusion Inject 1,000 Units/hr into the vein continuous.   hydrALAZINE 100 MG tablet Commonly  known as:  APRESOLINE Take 1 tablet (100 mg total) by mouth every 8 (eight) hours.   multivitamin with minerals Tabs tablet Take 1 tablet by mouth daily.   pantoprazole 40 MG tablet Commonly known as:  PROTONIX Take 1 tablet (40 mg total) by mouth daily at 12 noon.   tamsulosin 0.4 MG Caps capsule Commonly known as:  FLOMAX Take 1 capsule (0.4 mg total) by mouth daily. Start taking on:  03/06/2018   thiamine 250 MG tablet Take 1 tablet (250 mg total) by mouth daily.   vitamin C 1000 MG tablet Take 1,000 mg by mouth daily.       Follow-up Information    [name], MD. Schedule an appointment as soon as possible for a visit in 1 week(s).   Specialty:  Family Medicine Contact information: [address] [place] [address] [phone]        Donalee Citrin, MD. Schedule an appointment as soon as possible for a visit in 1 week(s).   Specialty:  Neurosurgery Contact information: [address] [place] [address] [phone]           Major procedures and Radiology Reports - PLEASE review detailed and final reports thoroughly  -         Dg Cervical Spine 2 Or 3 Views  Result Date: 02/28/2018 CLINICAL DATA:  Cervical decompression and fusion at C6-7 EXAM: DG C-ARM 61-120 MIN; CERVICAL SPINE - 2-3 VIEW COMPARISON:  None. FLUOROSCOPY TIME:  Radiation Exposure Index (as provided by the fluoroscopic device): Not available If the device does not provide the exposure index:  Fluoroscopy Time:  18 seconds Number of Acquired Images:  2 FINDINGS: Initial lateral images demonstrate diffuse degenerative changes of the cervical spine. The inferior aspect of the cervical spine is not well visualized due to overlying shoulder artifact. Second image demonstrates evidence of fusion at C6-7 with anterior fixation. IMPRESSION: C6-7 fusion. Electronically Signed   By: Alcide Clever M.D.   On: 02/28/2018 09:52   Ct Head Wo Contrast  Result Date: 02/25/2018 CLINICAL DATA:  Head trauma, minor, GCS greater than 13. Altered level of consciousness, unexplained. Unwitnessed fall 4 days ago. EXAM: CT HEAD WITHOUT CONTRAST TECHNIQUE: Contiguous axial images were obtained from the base of the skull through the vertex without intravenous contrast. COMPARISON:  CT head without contrast,/15/13. FINDINGS: Brain: Chronic encephalomalacia involving the anterior left frontal lobe and temporal tip is noted. This corresponds with previous injury. Study is moderately degraded by patient motion. Basal ganglia are otherwise intact. Insular ribbon is normal bilaterally apart from the anterior encephalomalacia on the left. Study is nondiagnostic in the posterior fossa. No acute hemorrhage or mass lesion is present. The ventricles are proportionate to the degree of atrophy with ex vacuo dilation involving the left lateral ventricle. Vascular: Atherosclerotic calcifications are present within the cavernous internal carotid arteries. There is no hyperdense vessel. Skull: No definite fractures are present. A remote nondisplaced right occipital skull fracture is present. No new fractures are present. Sinuses/Orbits: The visualized paranasal sinuses and mastoid air cells are clear. No definite orbit trauma is present. IMPRESSION: 1. Although the study is severely degraded by patient motion, no acute trauma is evident. 2. Expected chronic encephalomalacia involving the anterior inferior left frontal lobe and left temporal tip.  3. Ex vacuo dilation of the left lateral ventricle. 4. Remote left frontal craniotomy. 5. Remote right occipital skull fracture, stable. Electronically Signed   By: Virl Son.D.  On: 02/25/2018 16:48   Ct Cervical Spine Wo Contrast  Result Date: 02/25/2018 CLINICAL DATA:  Fall 4 days ago. Unable to ambulate since fall. Cervical spine trauma, high clinical risk. EXAM: CT CERVICAL SPINE WITHOUT CONTRAST TECHNIQUE: Multidetector CT imaging of the cervical spine was performed without intravenous contrast. Multiplanar CT image reconstructions were also generated. COMPARISON:  CT of the cervical spine 04/28/2012 FINDINGS: Alignment: Slight anterolisthesis at C2-3 is stable. AP alignment is otherwise anatomic. There straightening of the normal cervical lordosis. Skull base and vertebrae: The study is mildly degraded by patient motion. This obscures detail at the craniocervical junction. There is also some obscuration of detail in the lower cervical spine. No acute fracture present Soft tissues and spinal canal: Soft tissues the neck demonstrate atherosclerotic changes at the carotid bifurcations without significant stenosis. Thyroid is normal. No significant adenopathy is present. No soft tissue trauma is present. Disc levels: Left greater than right foraminal narrowing is again seen at C2-3, C3-4, C4-5, and C5-6 due to asymmetric uncovertebral and facet disease. Moderate to severe bilateral foraminal stenosis is present at C6-7. There is mild foraminal narrowing bilaterally at C7-T1. There is progressive stenosis since the prior study. Upper chest: The lung apices are clear. IMPRESSION: 1. No acute traumatic injury to the cervical spine. 2. Study is mildly degraded by patient motion. 3. Slight progression of multilevel moderate to severe spondylosis as described. Electronically Signed   By: Marin Roberts M.D.   On: 02/25/2018 16:56   Ct Thoracic Spine Wo Contrast  Result Date:  02/25/2018 CLINICAL DATA:  Fall.  Thoracic spine fracture. EXAM: CT THORACIC SPINE WITHOUT CONTRAST TECHNIQUE: Multidetector CT images of the thoracic were obtained using the standard protocol without intravenous contrast. COMPARISON:  None. FINDINGS: Alignment: Normal alignment Image quality degraded by motion. Vertebrae: Negative for thoracic fracture Paraspinal and other soft tissues: No paraspinous mass or soft tissue edema. Visualized lungs are clear. Calcified lymph nodes in the mediastinum and left hilum. Coronary artery calcification. Disc levels: Prominent anterior and right lateral spurring from T4 through T12. Mild disc space narrowing in the thoracic spine. Small calcified central disc protrusion at T8-9 with mild facet degeneration. IMPRESSION: Image quality degraded by motion Negative for thoracic fracture Electronically Signed   By: Marlan Palau M.D.   On: 02/25/2018 16:40   Ct Lumbar Spine Wo Contrast  Result Date: 02/25/2018 CLINICAL DATA:  The patient has been unable to ambulate since a fall 4 days ago at home. EXAM: CT LUMBAR SPINE WITHOUT CONTRAST TECHNIQUE: Multidetector CT imaging of the lumbar spine was performed without intravenous contrast administration. Multiplanar CT image reconstructions were also generated. COMPARISON:  None. FINDINGS: Segmentation: 5 lumbar type vertebrae. Alignment: Normal. Vertebrae: No fractures. Diffuse severe degenerative disc disease in the lumbar spine with relative sparing at L5-S1. Paraspinal and other soft tissues: There is slight dilatation of both ureters with marked distention of the bladder. Slight soft tissue stranding around the left ureter at the L5-S1 level with a small amount of fluid in the left pericolic gutter at that level. This is best seen on images 107-119 of series 5. Aortic atherosclerosis. Disc levels: T11-12: Slight disc space narrowing. Slight left facet arthritis. T12-L1: Disc space narrowing. Small calcified disc protrusion to the  left of midline. L1-2: Marked disc space narrowing. Small broad-based disc bulge without neural impingement. L2-3: Marked disc space narrowing. Small broad-based disc bulge with accompanying osteophytes extending into the right neural foramen without focal neural impingement. Slight narrowing of the spinal  canal. L3-4: Marked disc space narrowing. Broad-based endplate osteophytes slightly compress the thecal sac. No focal neural impingement. Moderate bilateral facet arthritis. L4-5: Marked disc space narrowing with degenerative changes of the vertebral endplates. Broad-based endplate osteophytes with moderate bilateral foraminal stenosis, left greater than right with moderate bilateral facet arthritis. There is hypertrophy of the ligamentum flavum and facet joints which combine to create moderately severe spinal stenosis best seen on image 91 of series 4. L5-S1: Small broad-based disc bulge without neural impingement. Moderate bilateral facet arthritis, right greater than left. Moderate right foraminal stenosis. IMPRESSION: 1. No acute abnormality of the lumbar spine. 2. Moderately severe spinal stenosis at L4-5. 3. Diffuse degenerative disc and joint disease in the lumbar spine as described. 4. Bilateral distended ureters and bladder. This may be due to bladder outlet obstruction or neurogenic bladder. Electronically Signed   By: Francene Boyers M.D.   On: 02/25/2018 16:43   Mr Cervical Spine Wo Contrast  Result Date: 02/27/2018 CLINICAL DATA:  Initial evaluation for generalized muscle weakness status post recent fall on 02/21/2018. EXAM: MRI CERVICAL, THORACIC AND LUMBAR SPINE WITHOUT CONTRAST TECHNIQUE: Multiplanar and multiecho pulse sequences of the cervical spine, to include the craniocervical junction and cervicothoracic junction, and thoracic and lumbar spine, were obtained without intravenous contrast. COMPARISON:  Prior CT from 02/25/2018 FINDINGS: MRI CERVICAL SPINE FINDINGS Alignment: Straightening  with smooth reversal of the normal cervical lordosis. Trace anterolisthesis of C2 on C3, stable. Vertebrae: Vertebral body heights maintained without evidence for acute or chronic fracture. Heterogeneous bone marrow signal intensity seen at C3-4 through T1-2 due to underlying degenerative spondylolysis. No discrete or worrisome osseous lesions. Cord: Patchy abnormal T2 signal abnormality seen within the cervical spinal cord at the level of C6-7, concerning for possible cord contusion/edema given provided history. No associated hemorrhage. Signal intensity within the cervical spinal cord otherwise normal. Posterior Fossa, vertebral arteries, paraspinal tissues: Visualized brain demonstrates no acute abnormality. Retrocerebellar cystic space could reflect mega cisterna magna versus arachnoid cyst. Craniocervical junction normal. Soft tissue edema adjacent to the right C6-7 facet, suspected to be posttraumatic in nature given adjacent cord injury (series 5, image 2). No definite discrete fracture line about the adjacent facet. Additionally, mild soft tissue edema within the posterior soft tissues adjacent to the spinous processes of C5 through C7, also likely posttraumatic (series 5, image 8). Patient is intubated with fluid in the nasopharynx. Abnormal flow void within the left vertebral artery, likely occluded. Finding is age indeterminate. Normal flow void within the dominant right vertebral artery. Disc levels: C2-C3: Trace anterolisthesis. Shallow central disc protrusion mildly indents the ventral thecal sac. Prominent left-sided facet degeneration. Resultant moderate left C3 foraminal stenosis. No significant canal narrowing. Right neural foraminal on the patent. C3-C4: Diffuse degenerative disc osteophyte with intervertebral disc space narrowing. Broad posterior component, asymmetric to the left flattens and partially faces the ventral thecal sac with resultant mild spinal stenosis. No significant cord  deformity. Superimposed bilateral facet hypertrophy. Resultant severe left with moderate right C4 foraminal narrowing. C4-C5: Diffuse degenerative disc osteophyte with intervertebral disc space narrowing with bilateral facet hypertrophy. Flattening of the ventral thecal sac with resultant mild spinal stenosis. Severe bilateral C5 foraminal narrowing. C5-C6: Diffuse disc osteophyte with intervertebral disc space narrowing. Broad posterior component indents and partially faces the ventral CSF. Resultant mild to moderate spinal stenosis without cord deformity. Bilateral facet hypertrophy. Resultant moderate bilateral C6 foraminal narrowing. C6-C7: Diffuse degenerative disc osteophyte with intervertebral disc space narrowing and bilateral uncovertebral hypertrophy. Broad posterior  disc osteophyte complex (mostly disc) effaces the ventral thecal sac, impinging upon the cervical spinal cord. Superimposed facet and ligament flavum hypertrophy. Resultant severe spinal stenosis with cord flattening. Thecal sac measures approximately 4 mm in AP diameter at its most narrow point. Associated cord signal abnormality, likely contusion/edema. Severe bilateral C7 foraminal stenosis. C7-T1: Diffuse degenerative disc osteophyte with intervertebral disc space narrowing and left greater than right uncovertebral spurring. No significant spinal stenosis. Severe bilateral C8 foraminal stenosis. MRI THORACIC SPINE FINDINGS Alignment: Vertebral bodies normally aligned with preservation of the normal thoracic kyphosis. No listhesis. Vertebrae: Vertebral body heights maintained without evidence for acute or chronic fracture. Bone marrow signal intensity within normal limits. Benign hemangioma noted within the T8 vertebral body. No other discrete or worrisome osseous lesions. No abnormal marrow edema. Cord: Signal intensity within the thoracic spinal cord is normal. No evidence for traumatic cord injury. Paraspinal and other soft tissues:  Paraspinous soft tissues demonstrate no acute finding. Patchy opacity within the partially visualized posterior right lower lobe, which could reflect atelectasis or infiltrate. Partially visualized visceral structures otherwise grossly unremarkable. Disc levels: T1-2: Mild diffuse disc bulge with bilateral facet hypertrophy. No significant spinal stenosis. Mild to moderate bilateral foraminal narrowing. T7-8: Tiny central disc protrusion mildly indents the ventral thecal sac. No significant stenosis. T8-9: Central disc protrusion indents the ventral thecal sac. Minimal flattening of the ventral cord without significant stenosis. T10-11: Minimal disc bulge. Bilateral facet hypertrophy. No spinal stenosis. Mild left neural foraminal narrowing. T11-12: Mild disc bulge. Bilateral facet hypertrophy. No significant spinal stenosis. Moderate left with mild right neural foraminal narrowing. T12-L1: Diffuse disc bulge. Left-sided facet hypertrophy. No significant spinal stenosis. Mild left neural foraminal narrowing. MRI LUMBAR SPINE FINDINGS Segmentation: Normal segmentation. Lowest well-formed disc labeled the L5-S1 level. Alignment: Levoscoliosis. Alignment otherwise normal with preservation of the normal lumbar lordosis. No listhesis. Vertebrae: Vertebral body heights maintained without evidence for acute or chronic fracture. There is an acute nondisplaced fracture extending through the S2 segment (series 18, image 8). No other definite fracture identified. Underlying bone marrow signal intensity heterogeneous but within normal limits. No worrisome osseous lesions. Conus medullaris and cauda equina: Conus extends to the T12 level. Conus and cauda equina appear normal. Paraspinal and other soft tissues: Visualized paraspinous soft tissues demonstrate no acute finding. Few scattered renal cysts noted bilaterally. Visualized visceral structures otherwise unremarkable. Disc levels: L1-2: Diffuse disc bulge with disc  desiccation and intervertebral disc space narrowing. Right-sided reactive endplate changes. Mild facet hypertrophy. No canal stenosis. Foramina remain patent. L2-3: Chronic intervertebral disc space narrowing with diffuse disc bulge and disc desiccation. Mild facet hypertrophy. Resultant mild spinal stenosis. Mild right L2 foraminal narrowing. L3-4: Chronic intervertebral disc space narrowing with diffuse disc bulge and disc desiccation. Disc bulging asymmetric to the right with right-sided reactive endplate changes. Moderate facet and ligament flavum hypertrophy. Resultant mild canal with bilateral lateral recess stenosis. Mild to moderate bilateral L3 foraminal narrowing. L4-5: Chronic intervertebral disc space narrowing with diffuse disc bulge and disc desiccation. Moderate facet ligament flavum hypertrophy. Resultant moderate canal with bilateral subarticular stenosis. Moderate left with mild to moderate right L4 foraminal narrowing. L5-S1: Shallow posterior disc bulge. Severe bilateral facet arthrosis. No made of a small 5 mm synovial cyst at the anteromedial aspect of the right L5-S1 facet. Resultant mild canal with moderate bilateral lateral recess narrowing. Foramina remain patent. IMPRESSION: MRI CERVICAL SPINE IMPRESSION: 1. Degenerative disc osteophyte with facet hypertrophy at C6-7 with resultant severe spinal stenosis and secondary cord  compression. Abnormal cord signal intensity at this level most likely reflects contusion/edema related to recent trauma/fall. Neurosurgical consultation recommended. 2. Soft tissue edema adjacent to the right C6-7 facet as well as the C5 through C7 spinous processes, likely posttraumatic in nature given the adjacent cord injury. No definite discrete fracture line identified. 3. 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 Associated multilevel moderate to severe foraminal narrowing as detailed above. MRI  THORACIC SPINE IMPRESSION: 1. No acute traumatic injury within the thoracic spine. 2. Multilevel degenerative spondylolysis with small disc protrusions at T7-8 and T8-9. No significant spinal stenosis. MRI LUMBAR SPINE IMPRESSION: 1. Acute nondisplaced sacral fracture, S2 segment. 2. 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 Resultant mild to moderate diffuse spinal stenosis, most pronounced at the L4-5 level. 3. Multifactorial degenerative changes with resultant multilevel foraminal narrowing as above. Notable findings include mild right L2, with mild to moderate bilateral L3 and L4 foraminal stenosis. Findings were discussed by telephone with Dr. Randol Kern at 2:45 p.m. on 02/27/2018. Electronically Signed   By: Rise Mu M.D.   On: 02/27/2018 14:46   Mr Thoracic Spine Wo Contrast  Result Date: 02/27/2018 CLINICAL DATA:  Initial evaluation for generalized muscle weakness status post recent fall on 02/21/2018. EXAM: MRI CERVICAL, THORACIC AND LUMBAR SPINE WITHOUT CONTRAST TECHNIQUE: Multiplanar and multiecho pulse sequences of the cervical spine, to include the craniocervical junction and cervicothoracic junction, and thoracic and lumbar spine, were obtained without intravenous contrast. COMPARISON:  Prior CT from 02/25/2018 FINDINGS: MRI CERVICAL SPINE FINDINGS Alignment: Straightening with smooth reversal of the normal cervical lordosis. Trace anterolisthesis of C2 on C3, stable. Vertebrae: Vertebral body heights maintained without evidence for acute or chronic fracture. Heterogeneous bone marrow signal intensity seen at C3-4 through T1-2 due to underlying degenerative spondylolysis. No discrete or worrisome osseous lesions. Cord: Patchy abnormal T2 signal abnormality seen within the cervical spinal cord at the level of C6-7, concerning for possible cord contusion/edema given provided history. No associated hemorrhage. Signal intensity within the cervical spinal  cord otherwise normal. Posterior Fossa, vertebral arteries, paraspinal tissues: Visualized brain demonstrates no acute abnormality. Retrocerebellar cystic space could reflect mega cisterna magna versus arachnoid cyst. Craniocervical junction normal. Soft tissue edema adjacent to the right C6-7 facet, suspected to be posttraumatic in nature given adjacent cord injury (series 5, image 2). No definite discrete fracture line about the adjacent facet. Additionally, mild soft tissue edema within the posterior soft tissues adjacent to the spinous processes of C5 through C7, also likely posttraumatic (series 5, image 8). Patient is intubated with fluid in the nasopharynx. Abnormal flow void within the left vertebral artery, likely occluded. Finding is age indeterminate. Normal flow void within the dominant right vertebral artery. Disc levels: C2-C3: Trace anterolisthesis. Shallow central disc protrusion mildly indents the ventral thecal sac. Prominent left-sided facet degeneration. Resultant moderate left C3 foraminal stenosis. No significant canal narrowing. Right neural foraminal on the patent. C3-C4: Diffuse degenerative disc osteophyte with intervertebral disc space narrowing. Broad posterior component, asymmetric to the left flattens and partially faces the ventral thecal sac with resultant mild spinal stenosis. No significant cord deformity. Superimposed bilateral facet hypertrophy. Resultant severe left with moderate right C4 foraminal narrowing. C4-C5: Diffuse degenerative disc osteophyte with intervertebral disc space narrowing with bilateral facet hypertrophy. Flattening of the ventral thecal sac with resultant mild spinal stenosis. Severe bilateral C5 foraminal narrowing. C5-C6: Diffuse disc osteophyte with intervertebral disc space narrowing. Broad posterior component indents and partially faces  the ventral CSF. Resultant mild to moderate spinal stenosis without cord deformity. Bilateral facet hypertrophy.  Resultant moderate bilateral C6 foraminal narrowing. C6-C7: Diffuse degenerative disc osteophyte with intervertebral disc space narrowing and bilateral uncovertebral hypertrophy. Broad posterior disc osteophyte complex (mostly disc) effaces the ventral thecal sac, impinging upon the cervical spinal cord. Superimposed facet and ligament flavum hypertrophy. Resultant severe spinal stenosis with cord flattening. Thecal sac measures approximately 4 mm in AP diameter at its most narrow point. Associated cord signal abnormality, likely contusion/edema. Severe bilateral C7 foraminal stenosis. C7-T1: Diffuse degenerative disc osteophyte with intervertebral disc space narrowing and left greater than right uncovertebral spurring. No significant spinal stenosis. Severe bilateral C8 foraminal stenosis. MRI THORACIC SPINE FINDINGS Alignment: Vertebral bodies normally aligned with preservation of the normal thoracic kyphosis. No listhesis. Vertebrae: Vertebral body heights maintained without evidence for acute or chronic fracture. Bone marrow signal intensity within normal limits. Benign hemangioma noted within the T8 vertebral body. No other discrete or worrisome osseous lesions. No abnormal marrow edema. Cord: Signal intensity within the thoracic spinal cord is normal. No evidence for traumatic cord injury. Paraspinal and other soft tissues: Paraspinous soft tissues demonstrate no acute finding. Patchy opacity within the partially visualized posterior right lower lobe, which could reflect atelectasis or infiltrate. Partially visualized visceral structures otherwise grossly unremarkable. Disc levels: T1-2: Mild diffuse disc bulge with bilateral facet hypertrophy. No significant spinal stenosis. Mild to moderate bilateral foraminal narrowing. T7-8: Tiny central disc protrusion mildly indents the ventral thecal sac. No significant stenosis. T8-9: Central disc protrusion indents the ventral thecal sac. Minimal flattening of the  ventral cord without significant stenosis. T10-11: Minimal disc bulge. Bilateral facet hypertrophy. No spinal stenosis. Mild left neural foraminal narrowing. T11-12: Mild disc bulge. Bilateral facet hypertrophy. No significant spinal stenosis. Moderate left with mild right neural foraminal narrowing. T12-L1: Diffuse disc bulge. Left-sided facet hypertrophy. No significant spinal stenosis. Mild left neural foraminal narrowing. MRI LUMBAR SPINE FINDINGS Segmentation: Normal segmentation. Lowest well-formed disc labeled the L5-S1 level. Alignment: Levoscoliosis. Alignment otherwise normal with preservation of the normal lumbar lordosis. No listhesis. Vertebrae: Vertebral body heights maintained without evidence for acute or chronic fracture. There is an acute nondisplaced fracture extending through the S2 segment (series 18, image 8). No other definite fracture identified. Underlying bone marrow signal intensity heterogeneous but within normal limits. No worrisome osseous lesions. Conus medullaris and cauda equina: Conus extends to the T12 level. Conus and cauda equina appear normal. Paraspinal and other soft tissues: Visualized paraspinous soft tissues demonstrate no acute finding. Few scattered renal cysts noted bilaterally. Visualized visceral structures otherwise unremarkable. Disc levels: L1-2: Diffuse disc bulge with disc desiccation and intervertebral disc space narrowing. Right-sided reactive endplate changes. Mild facet hypertrophy. No canal stenosis. Foramina remain patent. L2-3: Chronic intervertebral disc space narrowing with diffuse disc bulge and disc desiccation. Mild facet hypertrophy. Resultant mild spinal stenosis. Mild right L2 foraminal narrowing. L3-4: Chronic intervertebral disc space narrowing with diffuse disc bulge and disc desiccation. Disc bulging asymmetric to the right with right-sided reactive endplate changes. Moderate facet and ligament flavum hypertrophy. Resultant mild canal with  bilateral lateral recess stenosis. Mild to moderate bilateral L3 foraminal narrowing. L4-5: Chronic intervertebral disc space narrowing with diffuse disc bulge and disc desiccation. Moderate facet ligament flavum hypertrophy. Resultant moderate canal with bilateral subarticular stenosis. Moderate left with mild to moderate right L4 foraminal narrowing. L5-S1: Shallow posterior disc bulge. Severe bilateral facet arthrosis. No made of a small 5 mm synovial cyst at the anteromedial aspect of  the right L5-S1 facet. Resultant mild canal with moderate bilateral lateral recess narrowing. Foramina remain patent. IMPRESSION: MRI CERVICAL SPINE IMPRESSION: 1. Degenerative disc osteophyte with facet hypertrophy at C6-7 with resultant severe spinal stenosis and secondary cord compression. Abnormal cord signal intensity at this level most likely reflects contusion/edema related to recent trauma/fall. Neurosurgical consultation recommended. 2. Soft tissue edema adjacent to the right C6-7 facet as well as the C5 through C7 spinous processes, likely posttraumatic in nature given the adjacent cord injury. No definite discrete fracture line identified. 3. 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 Associated multilevel moderate to severe foraminal narrowing as detailed above. MRI THORACIC SPINE IMPRESSION: 1. No acute traumatic injury within the thoracic spine. 2. Multilevel degenerative spondylolysis with small disc protrusions at T7-8 and T8-9. No significant spinal stenosis. MRI LUMBAR SPINE IMPRESSION: 1. Acute nondisplaced sacral fracture, S2 segment. 2. 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 Resultant mild to moderate diffuse spinal stenosis, most pronounced at the L4-5 level. 3. Multifactorial degenerative changes with resultant multilevel foraminal narrowing as above. Notable findings include mild right L2, with mild to  moderate bilateral L3 and L4 foraminal stenosis. Findings were discussed by telephone with Dr. Randol Kern at 2:45 p.m. on 02/27/2018. Electronically Signed   By: Rise Mu M.D.   On: 02/27/2018 14:46   Mr Lumbar Spine Wo Contrast  Result Date: 02/27/2018 CLINICAL DATA:  Initial evaluation for generalized muscle weakness status post recent fall on 02/21/2018. EXAM: MRI CERVICAL, THORACIC AND LUMBAR SPINE WITHOUT CONTRAST TECHNIQUE: Multiplanar and multiecho pulse sequences of the cervical spine, to include the craniocervical junction and cervicothoracic junction, and thoracic and lumbar spine, were obtained without intravenous contrast. COMPARISON:  Prior CT from 02/25/2018 FINDINGS: MRI CERVICAL SPINE FINDINGS Alignment: Straightening with smooth reversal of the normal cervical lordosis. Trace anterolisthesis of C2 on C3, stable. Vertebrae: Vertebral body heights maintained without evidence for acute or chronic fracture. Heterogeneous bone marrow signal intensity seen at C3-4 through T1-2 due to underlying degenerative spondylolysis. No discrete or worrisome osseous lesions. Cord: Patchy abnormal T2 signal abnormality seen within the cervical spinal cord at the level of C6-7, concerning for possible cord contusion/edema given provided history. No associated hemorrhage. Signal intensity within the cervical spinal cord otherwise normal. Posterior Fossa, vertebral arteries, paraspinal tissues: Visualized brain demonstrates no acute abnormality. Retrocerebellar cystic space could reflect mega cisterna magna versus arachnoid cyst. Craniocervical junction normal. Soft tissue edema adjacent to the right C6-7 facet, suspected to be posttraumatic in nature given adjacent cord injury (series 5, image 2). No definite discrete fracture line about the adjacent facet. Additionally, mild soft tissue edema within the posterior soft tissues adjacent to the spinous processes of C5 through C7, also likely posttraumatic  (series 5, image 8). Patient is intubated with fluid in the nasopharynx. Abnormal flow void within the left vertebral artery, likely occluded. Finding is age indeterminate. Normal flow void within the dominant right vertebral artery. Disc levels: C2-C3: Trace anterolisthesis. Shallow central disc protrusion mildly indents the ventral thecal sac. Prominent left-sided facet degeneration. Resultant moderate left C3 foraminal stenosis. No significant canal narrowing. Right neural foraminal on the patent. C3-C4: Diffuse degenerative disc osteophyte with intervertebral disc space narrowing. Broad posterior component, asymmetric to the left flattens and partially faces the ventral thecal sac with resultant mild spinal stenosis. No significant cord deformity. Superimposed bilateral facet hypertrophy. Resultant severe left with moderate right C4 foraminal narrowing. C4-C5: Diffuse degenerative disc osteophyte with intervertebral disc  space narrowing with bilateral facet hypertrophy. Flattening of the ventral thecal sac with resultant mild spinal stenosis. Severe bilateral C5 foraminal narrowing. C5-C6: Diffuse disc osteophyte with intervertebral disc space narrowing. Broad posterior component indents and partially faces the ventral CSF. Resultant mild to moderate spinal stenosis without cord deformity. Bilateral facet hypertrophy. Resultant moderate bilateral C6 foraminal narrowing. C6-C7: Diffuse degenerative disc osteophyte with intervertebral disc space narrowing and bilateral uncovertebral hypertrophy. Broad posterior disc osteophyte complex (mostly disc) effaces the ventral thecal sac, impinging upon the cervical spinal cord. Superimposed facet and ligament flavum hypertrophy. Resultant severe spinal stenosis with cord flattening. Thecal sac measures approximately 4 mm in AP diameter at its most narrow point. Associated cord signal abnormality, likely contusion/edema. Severe bilateral C7 foraminal stenosis. C7-T1:  Diffuse degenerative disc osteophyte with intervertebral disc space narrowing and left greater than right uncovertebral spurring. No significant spinal stenosis. Severe bilateral C8 foraminal stenosis. MRI THORACIC SPINE FINDINGS Alignment: Vertebral bodies normally aligned with preservation of the normal thoracic kyphosis. No listhesis. Vertebrae: Vertebral body heights maintained without evidence for acute or chronic fracture. Bone marrow signal intensity within normal limits. Benign hemangioma noted within the T8 vertebral body. No other discrete or worrisome osseous lesions. No abnormal marrow edema. Cord: Signal intensity within the thoracic spinal cord is normal. No evidence for traumatic cord injury. Paraspinal and other soft tissues: Paraspinous soft tissues demonstrate no acute finding. Patchy opacity within the partially visualized posterior right lower lobe, which could reflect atelectasis or infiltrate. Partially visualized visceral structures otherwise grossly unremarkable. Disc levels: T1-2: Mild diffuse disc bulge with bilateral facet hypertrophy. No significant spinal stenosis. Mild to moderate bilateral foraminal narrowing. T7-8: Tiny central disc protrusion mildly indents the ventral thecal sac. No significant stenosis. T8-9: Central disc protrusion indents the ventral thecal sac. Minimal flattening of the ventral cord without significant stenosis. T10-11: Minimal disc bulge. Bilateral facet hypertrophy. No spinal stenosis. Mild left neural foraminal narrowing. T11-12: Mild disc bulge. Bilateral facet hypertrophy. No significant spinal stenosis. Moderate left with mild right neural foraminal narrowing. T12-L1: Diffuse disc bulge. Left-sided facet hypertrophy. No significant spinal stenosis. Mild left neural foraminal narrowing. MRI LUMBAR SPINE FINDINGS Segmentation: Normal segmentation. Lowest well-formed disc labeled the L5-S1 level. Alignment: Levoscoliosis. Alignment otherwise normal with  preservation of the normal lumbar lordosis. No listhesis. Vertebrae: Vertebral body heights maintained without evidence for acute or chronic fracture. There is an acute nondisplaced fracture extending through the S2 segment (series 18, image 8). No other definite fracture identified. Underlying bone marrow signal intensity heterogeneous but within normal limits. No worrisome osseous lesions. Conus medullaris and cauda equina: Conus extends to the T12 level. Conus and cauda equina appear normal. Paraspinal and other soft tissues: Visualized paraspinous soft tissues demonstrate no acute finding. Few scattered renal cysts noted bilaterally. Visualized visceral structures otherwise unremarkable. Disc levels: L1-2: Diffuse disc bulge with disc desiccation and intervertebral disc space narrowing. Right-sided reactive endplate changes. Mild facet hypertrophy. No canal stenosis. Foramina remain patent. L2-3: Chronic intervertebral disc space narrowing with diffuse disc bulge and disc desiccation. Mild facet hypertrophy. Resultant mild spinal stenosis. Mild right L2 foraminal narrowing. L3-4: Chronic intervertebral disc space narrowing with diffuse disc bulge and disc desiccation. Disc bulging asymmetric to the right with right-sided reactive endplate changes. Moderate facet and ligament flavum hypertrophy. Resultant mild canal with bilateral lateral recess stenosis. Mild to moderate bilateral L3 foraminal narrowing. L4-5: Chronic intervertebral disc space narrowing with diffuse disc bulge and disc desiccation. Moderate facet ligament flavum hypertrophy. Resultant moderate canal  with bilateral subarticular stenosis. Moderate left with mild to moderate right L4 foraminal narrowing. L5-S1: Shallow posterior disc bulge. Severe bilateral facet arthrosis. No made of a small 5 mm synovial cyst at the anteromedial aspect of the right L5-S1 facet. Resultant mild canal with moderate bilateral lateral recess narrowing. Foramina remain  patent. IMPRESSION: MRI CERVICAL SPINE IMPRESSION: 1. Degenerative disc osteophyte with facet hypertrophy at C6-7 with resultant severe spinal stenosis and secondary cord compression. Abnormal cord signal intensity at this level most likely reflects contusion/edema related to recent trauma/fall. Neurosurgical consultation recommended. 2. Soft tissue edema adjacent to the right C6-7 facet as well as the C5 through C7 spinous processes, likely posttraumatic in nature given the adjacent cord injury. No definite discrete fracture line identified. 3. 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 Associated multilevel moderate to severe foraminal narrowing as detailed above. MRI THORACIC SPINE IMPRESSION: 1. No acute traumatic injury within the thoracic spine. 2. Multilevel degenerative spondylolysis with small disc protrusions at T7-8 and T8-9. No significant spinal stenosis. MRI LUMBAR SPINE IMPRESSION: 1. Acute nondisplaced sacral fracture, S2 segment. 2. 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 Resultant mild to moderate diffuse spinal stenosis, most pronounced at the L4-5 level. 3. Multifactorial degenerative changes with resultant multilevel foraminal narrowing as above. Notable findings include mild right L2, with mild to moderate bilateral L3 and L4 foraminal stenosis. Findings were discussed by telephone with Dr. Randol Kern at 2:45 p.m. on 02/27/2018. Electronically Signed   By: Rise Mu M.D.   On: 02/27/2018 14:46   US Renal  Result Date: 02/26/2018 CLINICAL DATA:  67 year old male with a history of acute kidney injury EXAM: RENAL / URINARY TRACT ULTRASOUND COMPLETE COMPARISON:  None. FINDINGS: Right Kidney: Length: 16.2 cm. Mild-moderate right-sided hydronephrosis. Moderate right-sided cortical thinning. Echogenicity is similar to the adjacent liver parenchyma. Flow confirmed in the hilum of the  right kidney. Left Kidney: Length: 14.0 cm. Mild to moderate left-sided hydronephrosis. Echogenicity of the left renal cortex similar to the right. Moderate cortical thinning. Anechoic mass on the lateral cortex of the left kidney measures 3.8 cm x 5.4 cm x 4.3 cm with through transmission. No internal flow. No complexity. Flow confirmed in the hilum of the left kidney. Bladder: Urinary bladder distention. IMPRESSION: Bilateral mild to moderate hydronephrosis. Given the degree of bladder distention, uncertain if this represents bladder outlet obstruction and secondary bilateral hydronephrosis, or pathology of the bilateral distal ureters. Further evaluation with CT imaging recommended. Bilateral moderate renal cortical thinning indicating element of chronic kidney disease. Left-sided Bosniak 1 cyst. Electronically Signed   By: Gilmer Mor D.O.   On: 02/26/2018 10:24   Dg Chest Port 1 View  Result Date: 03/03/2018 CLINICAL DATA:  Congestion, shortness of breath EXAM: PORTABLE CHEST 1 VIEW COMPARISON:  02/25/2018 FINDINGS: Lungs are clear.  No pleural effusion or pneumothorax. The heart is normal in size. IMPRESSION: No evidence of acute cardiopulmonary disease. Electronically Signed   By: Charline Bills M.D.   On: 03/03/2018 13:06   Dg Chest Port 1 View  Result Date: 02/25/2018 CLINICAL DATA:  Leukocytosis, traumatic brain injury, unable to ambulate EXAM: PORTABLE CHEST 1 VIEW COMPARISON:  05/02/2012 FINDINGS: Minimal opacity in the right infrahilar lung. No pleural effusion. Normal heart size. No pneumothorax. Aortic atherosclerosis. IMPRESSION: Vague right infrahilar opacity, atelectasis versus minimal infiltrate. Electronically Signed   By: Jasmine Pang M.D.   On: 02/25/2018 20:33   Dg C-arm 1-60 Min  Result  Date: 02/28/2018 CLINICAL DATA:  Cervical decompression and fusion at C6-7 EXAM: DG C-ARM 61-120 MIN; CERVICAL SPINE - 2-3 VIEW COMPARISON:  None. FLUOROSCOPY TIME:  Radiation Exposure Index  (as provided by the fluoroscopic device): Not available If the device does not provide the exposure index: Fluoroscopy Time:  18 seconds Number of Acquired Images:  2 FINDINGS: Initial lateral images demonstrate diffuse degenerative changes of the cervical spine. The inferior aspect of the cervical spine is not well visualized due to overlying shoulder artifact. Second image demonstrates evidence of fusion at C6-7 with anterior fixation. IMPRESSION: C6-7 fusion. Electronically Signed   By: Alcide Clever M.D.   On: 02/28/2018 09:52   Dg C-arm 1-60 Min  Result Date: 02/28/2018 CLINICAL DATA:  Cervical decompression and fusion at C6-7 EXAM: DG C-ARM 61-120 MIN; CERVICAL SPINE - 2-3 VIEW COMPARISON:  None. FLUOROSCOPY TIME:  Radiation Exposure Index (as provided by the fluoroscopic device): Not available If the device does not provide the exposure index: Fluoroscopy Time:  18 seconds Number of Acquired Images:  2 FINDINGS: Initial lateral images demonstrate diffuse degenerative changes of the cervical spine. The inferior aspect of the cervical spine is not well visualized due to overlying shoulder artifact. Second image demonstrates evidence of fusion at C6-7 with anterior fixation. IMPRESSION: C6-7 fusion. Electronically Signed   By: Alcide Clever M.D.   On: 02/28/2018 09:52   Dg Hip Unilat W Or Wo Pelvis 2-3 Views Left  Result Date: 02/25/2018 CLINICAL DATA:  Fall, left leg pain. EXAM: DG HIP (WITH OR WITHOUT PELVIS) 2-3V LEFT COMPARISON:  None. FINDINGS: Hernia mesh markers noted along the lower pelvis. Lower lumbar spondylosis and degenerative disc disease. Spurring of both femoral heads. The patient's hips are both internally rotated on the pelvis and frontal projections, which can reduce sensitivity detecting fracture. No bony pelvic fracture is observed. There is spurring of the left femoral head causing a sclerotic edge extending across the femoral neck bilaterally. I do not see discrete cortical  discontinuity to suggest a fracture. Vascular calcification noted. IMPRESSION: 1. No fracture identified. Mildly reduced sensitivity due to internal rotation of the hip on the frontal projections. If the patient is not able to bear weight then other complementary imaging (preferably MRI, which has a greater sensitivity than CT) should be considered. 2. Atherosclerosis. 3. Lower lumbar spondylosis and degenerative disc disease. 4. Degenerative spurring of both femoral heads. Electronically Signed   By: Gaylyn Rong M.D.   On: 02/25/2018 15:44    Micro Results     Recent Results (from the past 240 hour(s))  MRSA PCR Screening     Status: None   Collection Time: 02/28/18  4:30 AM  Result Value Ref Range Status   MRSA by PCR NEGATIVE NEGATIVE Final    Comment:        The GeneXpert MRSA Assay (FDA approved for NASAL specimens only), is one component of a comprehensive MRSA colonization surveillance program. It is not intended to diagnose MRSA infection nor to guide or monitor treatment for MRSA infections. Performed at [facility], [address], [place], [place] [address]     Today   Subjective    Wayne Buckley today has no headache,no chest abdominal pain,no new weakness tingling or numbness, feels much better     Objective   Blood pressure 105/73, pulse 93, temperature 98.3 F (36.8 C), temperature source Oral, resp. rate 17, height 5\' 11"  (1.803 m), weight 99.8 kg, SpO2 98 %.   Intake/Output  Summary (Last 24 hours) at 03/05/2018 1134 Last data filed at 03/05/2018 1122 Gross per 24 hour  Intake 118 ml  Output 2350 ml  Net -2232 ml    Exam Awake Alert, neck postop scar under bandage, no new F.N deficits, Normal affect [place].AT,PERRAL Supple Neck,No JVD, No cervical lymphadenopathy appriciated.  Symmetrical Chest wall movement, Good air movement bilaterally, CTAB RRR,No Gallops,Rubs or new Murmurs, No Parasternal Heave +ve B.Sounds, Abd Soft, Non tender, No  organomegaly appriciated, No rebound -guarding or rigidity. No Cyanosis, Clubbing, left leg slightly swollen as compared to right leg, No new Rash or bruise   Data Review   CBC w Diff:  Lab Results  Component Value Date   WBC 24.8 (H) 03/05/2018   HGB 13.1 03/05/2018   HCT 39.5 03/05/2018   PLT 494 (H) 03/05/2018   LYMPHOPCT 8 02/25/2018   MONOPCT 9 02/25/2018   EOSPCT 3 02/25/2018   BASOPCT 0 02/25/2018    CMP:  Lab Results  Component Value Date   NA 134 (L) 03/05/2018   K 4.3 03/05/2018   CL 96 (L) 03/05/2018   CO2 26 03/05/2018   BUN 27 (H) 03/05/2018   CREATININE 0.69 03/05/2018   PROT 7.0 02/25/2018   ALBUMIN 3.4 (L) 02/25/2018   BILITOT 1.4 (H) 02/25/2018   ALKPHOS 96 02/25/2018   AST 25 02/25/2018   ALT 33 02/25/2018  .   Total Time in preparing paper work, data evaluation and todays exam - 35 minutes  Susa Raring M.D on 03/05/2018 at 11:34 AM  [facility]   Office  [phone]

## 2018-03-05 NOTE — PMR Pre-admission (Signed)
PMR Admission Coordinator Pre-Admission Assessment  Patient: Wayne Buckley is an 67 y.o., male MRN: [id number] DOB: 18-Jun-1951 Height: 5\' 11"  (180.3 cm) Weight: 99.8 kg              Insurance Information HMO:     PPO:      PCP:      IPA:      80/20:      OTHER:  PRIMARY: Medicare A & B      Policy#: [id number]      Subscriber: Self Pre-Cert#: Eligible       Employer: Retired  Benefits:  Phone #: Verified online     Name: [employer] Portal  Eff. Date: A:08/18/15  B:10/17/17     Deduct: $1364      Out of Pocket Max: N/A      Life Max: N/A CIR: 100%      SNF: 100% days 1-20; 80% 21-100 Outpatient: 80%     Co-Pay: 20% Home Health: 100%      Co-Pay: $0 DME: 80%     Co-Pay: 20% Providers: Patient's Choice   SECONDARY: [employer] of [place]      Policy#: [id number]      Subscriber: Spouse Employer: Full Time, but laid off May 2019 with compensation through May 2020 Benefits:  Phone #: [phone]  TERTIARY: Generic Commercial       Policy#: [id number]      Subscriber: Self Employer: Retired    Benefits:  Phone #: [phone]       Emergency Contact Information Contact Information    Name Relation Home Work Mobile   Raffel,Denise D  [phone]  [phone]     Current Medical History  Patient Admitting Diagnosis: Diagnosis: 67 yo male with hx of TBI who sustained fall and C6-7 cord contusion which required ACDF  History of Present Illness: Wayne Buckley is a 67 year old right-handed male with history of alcohol use, hypertension, TBI/SDH 6 years ago craniotomy received inpatient rehab services November 2013.  Per chart review he lives with his spouse.  Reportedly independent had refused assistive devices.  He was driving short distances.  2 level home with master bedroom and bathroom on main level.  Presented 02/25/2018 after a fall x2 with noted lower extremity weakness.  Cranial CT scan showed no acute changes.  Noted chronic encephalomalacia.  Remote left frontal craniotomy as well as remote  right occipital skull fracture.  Noted vascular studies lower extremities positive for acute DVT left common femoral vein, femoral vein, profunda vein, popliteal vein peroneal vein posterior tibial veins gastrocnemius vein.  Left external iliac vein also thrombosed.  Underwent placement of IVC filter 02/27/2018 per Dr. Myra Gianotti.  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  Underwent ACDF and fusion 02/28/2018 per Dr. Wynetta Emery.  Hospital course pain management.  Decadron protocol as indicated.  Noted leukocytosis secondary to steroids.  Intravenous heparin initiated for extensive left lower extremity DVT postoperatively 03/04/2018 and plan to begin Eliquis 03/08/2018.  Therapy evaluations completed with recommendations of physical medicine rehab consult.  Patient was admitted for a comprehensive rehab program 03/05/18.      Past Medical History  Past Medical History:  Diagnosis Date  . Hypertension   . TBI (traumatic brain injury) (HCC) 2013    Family History  family history includes Heart disease in his father.  Prior Rehab/Hospitalizations:  Has the patient had major surgery during 100 days prior to admission? No  Current Medications   Current Facility-Administered Medications:  .  0.9 %  sodium chloride infusion, , Intravenous, Continuous, Lupita LeashMcQuaid, Douglas B, MD, Stopped at 03/05/18 0547 .  amLODipine (NORVASC) tablet 10 mg, 10 mg, Oral, Daily, Tyrone NineGrunz, Ryan B, MD, 10 mg at 03/05/18 0902 .  benzonatate (TESSALON) capsule 200 mg, 200 mg, Oral, BID PRN, Blount, Xenia T, NP, 200 mg at 03/02/18 2141 .  carvedilol (COREG) tablet 12.5 mg, 12.5 mg, Oral, BID WC, Singh, Prashant K, MD .  cefTRIAXone (ROCEPHIN) 1 g in sodium chloride 0.9 % 100 mL IVPB, 1 g, Intravenous, Q24H, Leroy SeaSingh, Prashant K, MD, Last Rate: 200 mL/hr at 03/05/18 0959, 1 g at 03/05/18 0959 .  dexamethasone (DECADRON) injection 4 mg, 4 mg, Intravenous, Q12H, Leroy SeaSingh, Prashant K, MD, 4 mg at 03/05/18 0904 .  feeding supplement  (ENSURE ENLIVE) (ENSURE ENLIVE) liquid 237 mL, 237 mL, Oral, BID BM, Max FickleMcQuaid, Douglas B, MD, 237 mL at 03/05/18 0904 .  folic acid (FOLVITE) tablet 1 mg, 1 mg, Oral, Daily, McQuaid, Douglas B, MD, 1 mg at 03/05/18 0904 .  guaiFENesin-dextromethorphan (ROBITUSSIN DM) 100-10 MG/5ML syrup 5 mL, 5 mL, Oral, Q4H PRN, Hazeline JunkerGrunz, Ryan B, MD, 5 mL at 03/03/18 0905 .  heparin ADULT infusion 100 units/mL (25000 units/21250mL sodium chloride 0.45%), 1,000 Units/hr, Intravenous, Continuous, Scarlett Prestogan, Theresa D, [facility], Last Rate: 10 mL/hr at 03/05/18 0546, 1,000 Units/hr at 03/05/18 0546 .  hydrALAZINE (APRESOLINE) tablet 100 mg, 100 mg, Oral, Q8H, Leroy SeaSingh, Prashant K, MD, 100 mg at 03/05/18 0547 .  hydrochlorothiazide (HYDRODIURIL) tablet 25 mg, 25 mg, Oral, Daily, Max FickleMcQuaid, Douglas B, MD, 25 mg at 03/05/18 0904 .  labetalol (NORMODYNE,TRANDATE) injection 10 mg, 10 mg, Intravenous, Q4H PRN, Henry RusselSmith, Naithan, MD, 10 mg at 02/28/18 2311 .  lip balm (CARMEX) ointment 1 application, 1 application, Topical, PRN, Hazeline JunkerGrunz, Ryan B, MD .  LORazepam (ATIVAN) injection 1 mg, 1 mg, Intravenous, Once, McQuaid, Douglas B, MD .  multivitamin with minerals tablet 1 tablet, 1 tablet, Oral, Daily, Max FickleMcQuaid, Douglas B, MD, 1 tablet at 03/05/18 0903 .  phenol (CHLORASEPTIC) mouth spray 1 spray, 1 spray, Mouth/Throat, PRN, Hazeline JunkerGrunz, Ryan B, MD .  pneumococcal 23 valent vaccine (PNU-IMMUNE) injection 0.5 mL, 0.5 mL, Intramuscular, Tomorrow-1000, McQuaid, Douglas B, MD .  tamsulosin (FLOMAX) capsule 0.4 mg, 0.4 mg, Oral, Daily, Leroy SeaSingh, Prashant K, MD, 0.4 mg at 03/05/18 0904 .  thiamine (VITAMIN B-1) tablet 250 mg, 250 mg, Oral, Daily, Max FickleMcQuaid, Douglas B, MD, 150 mg at 03/05/18 0939 .  vitamin C (ASCORBIC ACID) tablet 1,000 mg, 1,000 mg, Oral, BID, Max FickleMcQuaid, Douglas B, MD, 1,000 mg at 03/05/18 [id number]  Patients Current Diet:  Diet Order            DIET SOFT Room service appropriate? Yes; Fluid consistency: Thin  Diet effective now               Precautions / Restrictions Precautions Precautions: Fall, Cervical Precaution Comments: No brace Restrictions Weight Bearing Restrictions: No   Has the patient had 2 or more falls or a fall with injury in the past year?Yes, the fall that lead to this admission   Prior Activity Level Community (5-7x/wk): Prior to admission patient was fully independent and driving in the community.    Home Assistive Devices / Equipment Home Assistive Devices/Equipment: Cane (specify quad or straight), Walker (specify type) Home Equipment: Walker - 2 wheels, Cane - single point  Prior Device Use: Indicate devices/aids used by the patient prior to current illness, exacerbation or injury? None of the above  Prior Functional Level  Prior Function Level of Independence: Independent Comments: refused to use any device for ambulation, pt was driving, retired Investment banker, operational, wife describes pt as "stubborn"  Self Care: Did the patient need help bathing, dressing, using the toilet or eating? Independent  Indoor Mobility: Did the patient need assistance with walking from room to room (with or without device)? Independent  Stairs: Did the patient need assistance with internal or external stairs (with or without device)? Independent  Functional Cognition: Did the patient need help planning regular tasks such as shopping or remembering to take medications? Independent  Current Functional Level Cognition  Overall Cognitive Status: Impaired/Different from baseline Current Attention Level: Focused Orientation Level: Oriented X4 Following Commands: Follows one step commands consistently Safety/Judgement: Decreased awareness of safety, Decreased awareness of deficits General Comments: pt in sitting leaning to right (sometimes needed VCs to correct and other times he self-corrected    Extremity Assessment (includes Sensation/Coordination)  Upper Extremity Assessment: Defer to OT evaluation RUE Deficits / Details:  ataxia RUE Coordination: decreased fine motor, decreased gross motor LUE Deficits / Details: ataxia LUE Coordination: decreased fine motor, decreased gross motor  Lower Extremity Assessment: RLE deficits/detail, LLE deficits/detail RLE Deficits / Details: AAROM WFL, strength grossly assessed hip 3+/5, knee flex 3+/5 knee ext 4/5, ankle 3+/5 RLE Coordination: decreased fine motor, decreased gross motor LLE Deficits / Details: increased edema from DVT, DVT with IV filter placement, increased edema limiting AAROM, grossly assessed strength hip 3+/5, knee 2+/5, ankle 2+/5 LLE Coordination: decreased fine motor, decreased gross motor    ADLs  Overall ADL's : Needs assistance/impaired Eating/Feeding: Supervision/ safety, Set up, Sitting Grooming: Moderate assistance, Sitting Upper Body Bathing: Maximal assistance, Sitting Lower Body Bathing: Total assistance, Bed level Upper Body Dressing : Maximal assistance, Sitting Lower Body Dressing: Total assistance, Bed level Functional mobility during ADLs: (requires lift)    Mobility  Overal bed mobility: Needs Assistance Bed Mobility: Rolling, Sit to Supine Rolling: Mod assist, +2 for safety/equipment Supine to sit: Max assist, +2 for physical assistance Sit to supine: Mod assist, +2 for physical assistance General bed mobility comments: Mod A to roll left and right x 2 each side, able to move RLE better than LLE to bend/cross to roll--once on his side difficult to maintain due to choreoathetosis; A for legs and trunk for up to sit EOB from right side lying    Transfers  Overall transfer level: Needs assistance Transfer via Lift Equipment: [place] General transfer comment: Deferred.    Ambulation / Gait / Stairs / Engineer, drilling / Balance Dynamic Sitting Balance Sitting balance - Comments: Pt sits EOB with Bil UE support and min A; performed reaching outside BoS with BUEs in all planes. More support needed when reaching  with RUE then LUE. Favors leaning right and able to self correct with cues. Choreoarthetosis impacting safe sitting balance. Sat EOB ~10 mins performing balance activities while wife worked on Nurse, mental health. Min-mod A needed.  Balance Overall balance assessment: Needs assistance Sitting-balance support: Bilateral upper extremity supported, Feet supported Sitting balance-[name] Scale: Poor Sitting balance - Comments: Pt sits EOB with Bil UE support and min A; performed reaching outside BoS with BUEs in all planes. More support needed when reaching with RUE then LUE. Favors leaning right and able to self correct with cues. Choreoarthetosis impacting safe sitting balance. Sat EOB ~10 mins performing balance activities while wife worked on Nurse, mental health. Min-mod A needed.  Postural control: Right lateral lean  Special needs/care consideration BiPAP/CPAP: No CPM: No Continuous Drip IV: Yes, Heparin   Dialysis: No         Life Vest: No Oxygen: No Special Bed: No Trach Size: No Wound Vac (area): No       Skin: Blister to left big toe, Monitor neck surgical incision, Moisture Associated Skin Damage to posterior bilateral buttocks                               Bowel mgmt: Incontinent, last BM 03/04/18 Bladder mgmt: Internal catheter placed 03/04/18 for acute urinary retention  Diabetic mgmt: No     Previous Home Environment Living Arrangements: Spouse/significant other Available Help at Discharge: Family, Available 24 hours/day Type of Home: House Home Layout: Two level, Able to live on main level with bedroom/bathroom Home Access: Stairs to enter Secretary/administrator of Steps: 4 Bathroom Shower/Tub: Health visitor: Standard Home Care Services: No  Discharge Living Setting Plans for Discharge Living Setting: Patient's home, Lives with (comment)(Spouse and daughter ) Type of Home at Discharge: House Discharge Home Layout: Two level, Able to live on main level with  bedroom/bathroom Alternate Level Stairs-Rails: Right Alternate Level Stairs-Number of Steps: 12 Discharge Home Access: Stairs to enter(back entrance ) Entrance Stairs-Rails: Can reach both Entrance Stairs-Number of Steps: 3 Discharge Bathroom Shower/Tub: Walk-in shower, Door Discharge Bathroom Toilet: Standard Discharge Bathroom Accessibility: No(Second bathroom on main potentially wheelchair accessible ) Does the patient have any problems obtaining your medications?: No  Social/Family/Support Systems Patient Roles: Spouse, Parent Contact Information: See above for spouse Anticipated Caregiver: Spouse Anticipated Caregiver's Contact Information: see above  Ability/Limitations of Caregiver: TBD amount of assist she can provide  Caregiver Availability: 24/7 Discharge Plan Discussed with Primary Caregiver: Yes Is Caregiver In Agreement with Plan?: Yes Does Caregiver/Family have Issues with Lodging/Transportation while Pt is in Rehab?: No  Goals/Additional Needs Patient/Family Goal for Rehab: PT/OT: Supervision-Min A; SLP: Mod I Expected length of stay: 20-30 days  Cultural Considerations: Catholic  Dietary Needs: Dys. 3 textures and thin liquids  Equipment Needs: TBD Special Service Needs: None Additional Information: Was previously admitted here with BI in 2013 Pt/Family Agrees to Admission and willing to participate: Yes Program Orientation Provided & Reviewed with Pt/Caregiver Including Roles  & Responsibilities: Yes Additional Information Needs: Spouse, requests that [name] review diagnosis and prognosis  Information Needs to be Provided By: [name] made aware of request for after 2:30pm today via text  Barriers to Discharge: Medical stability, Home environment access/layout  Decrease burden of Care through IP rehab admission: No  Possible need for SNF placement upon discharge: Not anticipated   Patient Condition: This patient's medical and functional status has  changed since the consult dated: 03/01/18 in which the Rehabilitation Physician determined and documented that the patient's condition is appropriate for intensive rehabilitative care in an inpatient rehabilitation facility. See "History of Present Illness" (above) for medical update. Functional changes are: Mod-Max A bed mobility. Patient's medical and functional status update has been discussed with the Rehabilitation physician and patient remains appropriate for inpatient rehabilitation. Will admit to inpatient rehab today.  Preadmission Screen Completed By:  Fae Pippin, 03/05/2018 1:07 PM ______________________________________________________________________   Discussed status with [name] on 03/05/18 at 1330 and received telephone approval for admission today.  Admission Coordinator:  Fae Pippin, time 1330/Date 03/05/18

## 2018-03-05 NOTE — Progress Notes (Signed)
CSW notes patient will discharge to CIR today. CSW signing off.  Osborne Cascoadia Radley Teston LCSW [phone]

## 2018-03-05 NOTE — Progress Notes (Signed)
ANTICOAGULATION CONSULT NOTE  Pharmacy Consult for Heparin Indication: DVT  No Known Allergies  Patient Measurements: Height: 5\' 11"  (180.3 cm) IBW/kg (Calculated) : 75.3 Heparin Dosing Weight: 95.8 kg  Vital Signs: Temp: 98.5 F (36.9 C) (09/17 1935) Temp Source: Oral (09/17 1500) BP: 133/83 (09/17 1935) Pulse Rate: 75 (09/17 1935)  Labs: Recent Labs    03/03/18 0831 03/04/18 0407 03/04/18 0619 03/05/18 0736 03/05/18 1151 03/05/18 1947  HGB 14.2  --  13.5 13.1  --   --   HCT 42.8  --  40.8 39.5  --   --   PLT 506*  --  502* 494*  --   --   HEPARINUNFRC  --   --   --   --  <0.10* 0.19*  CREATININE 0.68 0.66  --  0.69  --   --     Estimated Creatinine Clearance: 106.4 mL/min (by C-G formula based on SCr of 0.69 mg/dL).   Medical History: Past Medical History:  Diagnosis Date  . Hypertension   . TBI (traumatic brain injury) [facility](HCC) 2013   Assessment: 67 yr old male to begin IV heparin in am on 9/17 for extensive left leg DVT. Noted fall PTA resulting in C6-7 disc herniation causing severe spinal stenosis and cord compression. S/p IVC filter and ACDF surgery on 02/27/18.  Dr. Thedore MinsSingh discussed with Dr. Wynetta Emeryram and conservative anticoagulation started 9/17. No heparin boluses to be given. Also noted with hx SDH in 2013 due to fall.  Heparin level increased but remains subtherapeutic at 0.19. He was started out very conservatively. No bleeding or issues with infusion per discussion with RN. Cbc stable.  Goal of Therapy:  Heparin level 0.3-0.5 units/ml (lower end of therapeutic range) Monitor platelets by anticoagulation protocol: Yes   Plan:  Increase heparin to 1600 units/hr 6h heparin level Monitor daily heparin level and CBC, s/sx bleeding  Babs BertinHaley Kalanie Fewell, PharmD, BCPS Clinical Pharmacist Clinical phone [phone] Please check AMION for all [facility] contact numbers 03/05/2018 8:54 PM

## 2018-03-05 NOTE — Progress Notes (Signed)
  Speech Language Pathology Treatment: Dysphagia  Patient Details Name: Wayne Buckley MRN: [id number] DOB: 11/17/1950 Today's Date: 03/05/2018 Time: [id number] SLP Time Calculation (min) (ACUTE ONLY): 14 min  Assessment / Plan / Recommendation Clinical Impression  Pt needs Min cues to increase safety with swallowing, specifically for slower pacing, not talking with food in his mouth, and clearing lingual residue. He consumed a whole package of graham crackers, washing it down with thin liquids, without overt signs of aspiration despite impulsivity. His wife does say that he will occasionally cough at home, but they both agree that it is infrequent. Recommend continuing current diet and precautions. Of note, CIR coordinator present and plan is for pt to d/c to CIR today. Therefore, will defer additional f/u to that venue.   HPI HPI: 67 yr old gentleman with pMHx for TBI, ETOH abuse, and most recently a series of falls.  Pt found to have a new LLE DVT, now s/p IVC filter and POD 0 s/p ACDF for C6-7 disk herniation with cord compression 11/28/17, currently on clear liquids per MD. Tenna ChildSwallow evaluation ordered. [facility]; required solid texture modifications due to oral dysphagia/pocketing but progressed to dys 3, thin liquids at time of discharge (05/28/12).       SLP Plan  Continue with current plan of care       Recommendations  Diet recommendations: Dysphagia 3 (mechanical soft);Thin liquid Liquids provided via: Cup;Straw Medication Administration: Whole meds with liquid Supervision: Patient able to self feed;Full supervision/cueing for compensatory strategies Compensations: Minimize environmental distractions;Slow rate;Small sips/bites;Lingual sweep for clearance of pocketing;Follow solids with liquid Postural Changes and/or Swallow Maneuvers: Seated upright 90 degrees                Oral Care Recommendations: Oral care BID Follow up  Recommendations: Inpatient Rehab SLP Visit Diagnosis: Dysphagia, unspecified (R13.10) Plan: Continue with current plan of care       GO                Maxcine Hamaiewonsky, Mirl Hillery 03/05/2018, 11:35 AM  Maxcine HamLaura Paiewonsky, M.A. CCC-SLP Acute Herbalistehabilitation Services Pager [phone] Office [phone]

## 2018-03-05 NOTE — Discharge Instructions (Signed)
Follow with Primary MD Daisy Florooss, Charles Alan, MD in 7 days   Get CBC, CMP, 2 view Chest X ray checked  by Rehab MD in 5-7 days    Activity: As tolerated with Full fall precautions use walker/cane & assistance as needed  Disposition CIR  Diet:   Soft diet with full feeding assistance and aspiration precautions.  For Heart failure patients - Check your Weight same time everyday, if you gain over 2 pounds, or you develop in leg swelling, experience more shortness of breath or chest pain, call your Primary MD immediately. Follow Cardiac Low Salt Diet and 1.5 lit/day fluid restriction.  Special Instructions: If you have smoked or chewed Tobacco  in the last 2 yrs please stop smoking, stop any regular Alcohol  and or any Recreational drug use.  On your next visit with your primary care physician please Get Medicines reviewed and adjusted.  Please request your Prim.MD to go over all Hospital Tests and Procedure/Radiological results at the follow up, please get all Hospital records sent to your Prim MD by signing hospital release before you go home.  If you experience worsening of your admission symptoms, develop shortness of breath, life threatening emergency, suicidal or homicidal thoughts you must seek medical attention immediately by calling 911 or calling your MD immediately  if symptoms less severe.  You Must read complete instructions/literature along with all the possible adverse reactions/side effects for all the Medicines you take and that have been prescribed to you. Take any new Medicines after you have completely understood and accpet all the possible adverse reactions/side effects.

## 2018-03-05 NOTE — Progress Notes (Signed)
Pt transferred to 4W02. IV/site dry,clean and intact infusing Heparin @ 3014ml/hr. Pt A&Ox4 and stable. Report given to nurse on 4W. Pt escorted via bed by RN.

## 2018-03-06 ENCOUNTER — Inpatient Hospital Stay (HOSPITAL_COMMUNITY): Payer: Medicare Other | Admitting: Speech Pathology

## 2018-03-06 ENCOUNTER — Inpatient Hospital Stay (HOSPITAL_COMMUNITY): Payer: Medicare Other | Admitting: Physical Therapy

## 2018-03-06 ENCOUNTER — Inpatient Hospital Stay (HOSPITAL_COMMUNITY): Payer: Medicare Other | Admitting: Occupational Therapy

## 2018-03-06 LAB — COMPREHENSIVE METABOLIC PANEL
ALK PHOS: 89 U/L (ref 38–126)
ALT: 66 U/L — AB (ref 0–44)
AST: 34 U/L (ref 15–41)
Albumin: 2.5 g/dL — ABNORMAL LOW (ref 3.5–5.0)
Anion gap: 13 (ref 5–15)
BILIRUBIN TOTAL: 0.8 mg/dL (ref 0.3–1.2)
BUN: 23 mg/dL (ref 8–23)
CALCIUM: 8.5 mg/dL — AB (ref 8.9–10.3)
CHLORIDE: 98 mmol/L (ref 98–111)
CO2: 24 mmol/L (ref 22–32)
CREATININE: 0.63 mg/dL (ref 0.61–1.24)
GFR calc non Af Amer: >60 mL/min (ref >60)
Glucose, Bld: 123 mg/dL — ABNORMAL HIGH (ref 70–99)
Potassium: 4.2 mmol/L (ref 3.5–5.1)
SODIUM: 135 mmol/L (ref 135–145)
Total Protein: 5.4 g/dL — ABNORMAL LOW (ref 6.5–8.1)

## 2018-03-06 LAB — CBC WITH DIFFERENTIAL/PLATELET
BASOS PCT: 0 %
Basophils Absolute: 0 10*3/uL (ref 0.0–0.1)
EOS ABS: 0 10*3/uL (ref 0.0–0.7)
Eosinophils Relative: 0 %
HCT: 40.5 % (ref 39.0–52.0)
Hemoglobin: 13.4 g/dL (ref 13.0–17.0)
Lymphocytes Relative: 14 %
Lymphs Abs: 3.6 10*3/uL (ref 0.7–4.0)
MCH: 29.5 pg (ref 26.0–34.0)
MCHC: 33.1 g/dL (ref 30.0–36.0)
MCV: 89 fL (ref 78.0–100.0)
MONO ABS: 1 10*3/uL (ref 0.1–1.0)
Monocytes Relative: 4 %
NEUTROS ABS: 21.1 10*3/uL — AB (ref 1.7–7.7)
Neutrophils Relative %: 82 %
PLATELETS: 526 10*3/uL — AB (ref 150–400)
RBC: 4.55 MIL/uL (ref 4.22–5.81)
RDW: 12.8 % (ref 11.5–15.5)
WBC: 25.7 10*3/uL — ABNORMAL HIGH (ref 4.0–10.5)

## 2018-03-06 LAB — HEPARIN LEVEL (UNFRACTIONATED)
HEPARIN UNFRACTIONATED: 0.34 [IU]/mL (ref 0.30–0.70)
Heparin Unfractionated: 0.5 IU/mL (ref 0.30–0.70)

## 2018-03-06 MED ORDER — DEXAMETHASONE 2 MG PO TABS
2.0000 mg | ORAL_TABLET | Freq: Two times a day (BID) | ORAL | Status: DC
  Administered 2018-03-06 – 2018-03-08 (×5): 2 mg via ORAL
  Filled 2018-03-06 (×5): qty 1

## 2018-03-06 NOTE — Progress Notes (Addendum)
PHYSICAL MEDICINE & REHABILITATION     PROGRESS NOTE    Subjective/Complaints: Had a reasonable night. Slept well especially early this morning   ROS: Patient denies fever, rash, sore throat, blurred vision, nausea, vomiting, diarrhea, cough, shortness of breath or chest pain, joint or back pain, headache, or mood change.   Objective:   No results found. Recent Labs    03/05/18 0736 03/06/18 0413  WBC 24.8* 25.7*  HGB 13.1 13.4  HCT 39.5 40.5  PLT 494* 526*   Recent Labs    03/05/18 0736 03/06/18 0413  NA 134* 135  K 4.3 4.2  CL 96* 98  GLUCOSE 126* 123*  BUN 27* 23  CREATININE 0.69 0.63  CALCIUM 8.6* 8.5*   CBG (last 3)  No results for input(s): GLUCAP in the last 72 hours.  Wt Readings from Last 3 Encounters:  03/02/18 99.8 kg  08/07/12 88.2 kg  06/24/12 84.6 kg     Intake/Output Summary (Last 24 hours) at 03/06/2018 0703 Last data filed at 03/05/2018 2118 Gross per 24 hour  Intake -  Output 1100 ml  Net -1100 ml    Vital Signs: Blood pressure (!) 149/98, pulse 72, temperature 98.7 F (37.1 C), temperature source Oral, resp. rate 18, height 5\' 11"  (1.803 m), SpO2 99 %. Physical Exam:  Constitutional: No distress . Vital signs reviewed. HEENT: EOMI, oral membranes moist Neck: supple, steristrips on neck Cardiovascular: RRR without murmur. No JVD    Respiratory: CTA Bilaterally without wheezes or rales. Normal effort    GI: BS +, non-tender, non-distended  Left lower extremity with 2+ edema, slightly warm and mildly erythematous Neurological: He isalertand oriented to person, place, and time. Biceps and deltoids are 5 out of 5. triceps and HI 3/5. Right lower extremity grossly 4 out of 5 proximal distal. Left lower extremity is able to move at all levels but is limited due to pain and swelling. Patient is very distractible and appears impulsive. Sensory function seems to be decreased to light touch below the level of his injury. May  have increase in resting tone in the left lower extremity but difficult to discern given the swelling and impulsivity. Skin: Skin iswarm.  Psychiatric: Pleasant, a little cooperative.    Assessment/Plan: 1. 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 Physiatrist is providing close team supervision and 24 hour management of active medical problems listed below. Physiatrist and rehab team continue to assess barriers to discharge/monitor patient progress toward functional and medical goals.  Function:  Bathing Bathing position      Bathing parts      Bathing assist        Upper Body Dressing/Undressing Upper body dressing                    Upper body assist        Lower Body Dressing/Undressing Lower body dressing                                  Lower body assist        Toileting Toileting          Toileting assist     Transfers Chair/bed Optician, dispensing  Cognition Comprehension    Expression    Social Interaction    Problem Solving    Memory     Medical Problem List and Plan: 1.Decreased functional mobilitysecondary to C6-7 cord compression.S/PACDF C6-7 02/28/2018 as well as history of TBI/SDH 2013 -beginning therapies 2. DVT Prophylaxis/Anticoagulation: Extensive left lower extremity DVT status post IVC filter 02/27/2018. IV heparin initiated 03/04/2018 transitioning to Eliquisbeginning9/20/2019 -Local edema control left lower extremity, elevation 3. Pain Management:Tylenol as needed 4. Mood:Provide emotional support 5. Neuropsych: This patientiscapable of making decisions on hisown behalf. 6. Skin/Wound Care:Routine skin checks 7. Fluids/Electrolytes/Nutrition:  -I personally reviewed the patient's  labs today.   8.Leukocytosis. Steroid-induced. Urine study negative  -afebrile, exam benign 9.Hypertension. HCTZ 25 mg daily, Coreg 12.5mg  twice daily, Norvasc 10 mg daily,hydralazine 100 mg every 8 hours. Monitorfor orthostatic hypotension 10.BPH/neurogenic bladder -Flomax 0.4 mg daily -begin voiding trial, I/O 11.?neurogenic bowel: -follow for bowel patterns. 12.History of alcohol use. Provide counseling  LOS (Days) 1 A FACE TO FACE EVALUATION WAS PERFORMED  Ranelle OysterZachary T Hara Milholland, MD 03/06/2018 7:03 AM

## 2018-03-06 NOTE — Evaluation (Signed)
Occupational Therapy Assessment and Plan  Patient Details  Name: Wayne Buckley MRN: [id number] Date of Birth: 11/03/1950  OT Diagnosis: abnormal posture, ataxia, cognitive deficits, muscle weakness (generalized) and coordination disorder, quadriplegia Rehab Potential: Rehab Potential (ACUTE ONLY): Good ELOS: 25-28 days   Today's Date: 03/06/2018 OT Individual Time: 1100-1200 OT Individual Time Calculation (min): 60 min     Problem List:  Patient Active Problem List   Diagnosis Date Noted  . Cord compression ([place]) 03/05/2018  . C5-C7 level spinal cord injury ([place])   . Central cord syndrome ([place])   . Neurogenic bladder   . Essential hypertension   . AKI (acute kidney injury) ([place]) 02/26/2018  . Bilateral leg weakness 02/26/2018  . Leg DVT (deep venous thromboembolism), acute, left ([place]) 02/26/2018  . Fall 02/25/2018  . Alcohol abuse 05/04/2012  . Hypokalemia 05/04/2012  . Urinary retention 05/04/2012  . Subdural hematoma, post-traumatic ([place]) 04/28/2012    Past Medical History:  Past Medical History:  Diagnosis Date  . Hypertension   . TBI (traumatic brain injury) ([place]) 2013   Past Surgical History:  Past Surgical History:  Procedure Laterality Date  . ANTERIOR CERVICAL DECOMP/DISCECTOMY FUSION N/A 02/27/2018   Procedure: ANTERIOR CERVICAL DECOMPRESSION/DISCECTOMY FUSION C6-7;  Surgeon: Kary Kos, MD;  Location: [place];  Service: Neurosurgery;  Laterality: N/A;  . CRANIOTOMY  04/28/2012   Procedure: CRANIOTOMY HEMATOMA EVACUATION SUBDURAL;  Surgeon: Faythe Ghee, MD;  Location: [place];  Service: Neurosurgery;  Laterality: Left;  Left Craniotomy for Subdural Hematoma  . RADIOLOGY WITH ANESTHESIA N/A 02/27/2018   Procedure: MRI WITH ANESTHESIA;  Surgeon: Radiologist, Medication, MD;  Location: [place];  Service: Radiology;  Laterality: N/A;  . VENA CAVA FILTER PLACEMENT Right 02/27/2018   Procedure: INSERTION VENA-CAVA FILTER;  Surgeon: Serafina Mitchell, MD;  Location: [facility] OR;   Service: Vascular;  Laterality: Right;    Assessment & Plan Clinical Impression: Patient is a 67 y.o. year old male with history of alcohol use, hypertension, TBI/SDH 6 years ago craniotomy received inpatient rehab services November 2013. Per chart review he lives with his spouse. Reportedly independent had refused assistive devices. He was driving short distances. 2 level home with bedroom on [address]. Presented 02/25/2018 after a fall x2 with noted lower extremity weakness. Cranial CT scan showed no acute changes. Noted chronic encephalomalacia. Remote left frontal craniotomy as well as remote right occipital skull fracture. Noted vascular studies lower extremities positive for acute DVT left common femoral vein, femoral vein, profunda vein, popliteal vein peroneal vein posterior tibial veins gastrocnemius vein. Left external iliac vein also thrombosed. Underwent placement of IVC filter 02/27/2018 per Dr. Trula Slade. 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 Underwent ACDF and fusion 02/28/2018 per Dr. Saintclair Halsted. Hospital course pain management. Decadron protocol as indicated. Noted leukocytosis secondary to steroids. Intravenous heparin initiated for extensive left lower extremity DVT postoperatively 9/16/201910 plan to begin Eliquis 03/08/2018. Marland Kitchen  Patient transferred to CIR on 03/05/2018 .    Patient currently requires total with basic self-care skills secondary to muscle weakness, decreased cardiorespiratoy endurance, ataxia, decreased coordination and decreased motor planning, decreased motor planning, decreased initiation, decreased attention, decreased awareness, decreased problem solving, decreased safety awareness and decreased memory and decreased sitting balance, decreased standing balance, decreased postural control and decreased balance strategies.  Prior to hospitalization, patient could complete ADLs with modified independent  per pt report.  Patient will benefit from skilled  intervention to decrease level of assist with basic self-care skills prior to discharge home  with care partner.  Anticipate patient will require 24 hour supervision and minimal physical assistance and follow up home health.  OT - End of Session Activity Tolerance: Decreased this session Endurance Deficit: Yes Endurance Deficit Description: multiple rest breaks secondary to fatigue OT Assessment Rehab Potential (ACUTE ONLY): Good OT Barriers to Discharge: Other (comments) OT Barriers to Discharge Comments: none known at this time OT Patient demonstrates impairments in the following area(s): Balance;Behavior;Cognition;Endurance;Perception;Safety OT Basic ADL's Functional Problem(s): Grooming;Bathing;Dressing;Toileting OT Transfers Functional Problem(s): Toilet;Tub/Shower OT Additional Impairment(s): None OT Plan OT Intensity: Minimum of 1-2 x/day, 45 to 90 minutes OT Frequency: 5 out of 7 days OT Duration/Estimated Length of Stay: 25-28 days OT Treatment/Interventions: Balance/vestibular training;Neuromuscular re-education;Self Care/advanced ADL retraining;Therapeutic Exercise;Wheelchair propulsion/positioning;Cognitive remediation/compensation;DME/adaptive equipment instruction;Pain management;UE/LE Strength taining/ROM;Patient/family education;UE/LE Coordination activities;Discharge planning;Functional mobility training;Psychosocial support;Therapeutic Activities OT Self Feeding Anticipated Outcome(s): S OT Basic Self-Care Anticipated Outcome(s): S/set up - min A OT Toileting Anticipated Outcome(s): min A OT Bathroom Transfers Anticipated Outcome(s): min A OT Recommendation Recommendations for Other Services: Other (comment)(none at this time) Patient destination: Home Follow Up Recommendations: 24 hour supervision/assistance;Home health OT Equipment Recommended: To be determined   Skilled Therapeutic Intervention Upon entering the room, pt seated in wheelchair awaiting OT arrival. Pt  with increased fatigue secondary to decreased sitting tolerance. Pt requesting to return to bed. Total A squat pivot transfer from wheelchair >bed towards L with second helper to steady equipment. Pt returning to supine with total A as well. OT educating pt on OT purpose, POC, and goals with pt verbalizing understanding and agreement. UE coordination exercises from bed level with pt having increased difficulty and overshooting. Pt performs all tasks/movements very quickly. Pt remained in bed with Lab arriving.Bed alarm activated and call bell within reach upon exiting the room.   OT Evaluation Precautions/Restrictions  Precautions Precautions: Fall;Cervical Restrictions Weight Bearing Restrictions: No Pain Pain Assessment Pain Scale: 0-10 Pain Score: 0-No pain Home Living/Prior Functioning Home Living Family/patient expects to be discharged to:: Private residence Living Arrangements: Spouse/significant other Available Help at Discharge: Family, Available 24 hours/day Type of Home: House Home Access: Stairs to enter Technical brewer of Steps: 4 Entrance Stairs-Rails: Left Home Layout: Two level, Able to live on main level with bedroom/bathroom Bathroom Shower/Tub: Multimedia programmer: Standard  Lives With: Spouse Prior Function Level of Independence: Independent with transfers, Independent with gait, Independent with basic ADLs  Able to Take Stairs?: Yes Driving: Yes Vocation: Retired Comments: refused to use any device for ambulation, pt was driving, retired Armed forces training and education officer, wife describes pt as "stubborn" Vision Baseline Vision/History: Wears glasses(contacts) Wears Glasses: At all times Patient Visual Report: No change from baseline Vision Assessment?: No apparent visual deficits Cognition Overall Cognitive Status: Impaired/Different from baseline Orientation Level: Person;Place;Situation Person: Oriented Place: Oriented Situation: Oriented Year:  2019 Month: September Day of Week: Correct Immediate Memory Recall: Sock;Blue;Bed Memory Recall: (0/3  "i don't know") Attention: Sustained;Selective Sustained Attention: Appears intact Selective Attention: Impaired Awareness: Impaired Problem Solving: Impaired Safety/Judgment: Impaired Sensation Sensation Light Touch: Appears Intact Coordination Gross Motor Movements are Fluid and Coordinated: No Fine Motor Movements are Fluid and Coordinated: No Coordination and Movement Description: ataxia noted in BUE and BLEs Heel Shin Test: unable 2/2 weakness Motor  Motor Motor: Ataxia;Motor apraxia;Abnormal postural alignment and control Mobility  Bed Mobility Bed Mobility: Rolling Right;Right Sidelying to Sit Rolling Right: Maximal Assistance - Patient 25-49% Right Sidelying to Sit: Total Assistance - Patient < 25% Transfers Sit to Stand: 2 Helpers Stand to Sit:  2 Helpers  Trunk/Postural Assessment  Cervical Assessment Cervical Assessment: Exceptions to [facility] Thoracic Assessment Thoracic Assessment: Exceptions to WFL(rounded shoulders) Lumbar Assessment Lumbar Assessment: Exceptions to WFL(posterior pelvic tilt) Postural Control Postural Control: Deficits on evaluation Trunk Control: impaired Protective Responses: delayed, insufficient Postural Limitations: decreased limits of stability  Balance Balance Balance Assessed: Yes Static Sitting Balance Static Sitting - Balance Support: Right upper extremity supported;Left upper extremity supported;Feet supported Static Sitting - Level of Assistance: 4: Min assist Extremity/Trunk Assessment RUE Assessment RUE Assessment: Within Functional Limits General Strength Comments: ataxic movements LUE Assessment LUE Assessment: Within Functional Limits General Strength Comments: ataxic movements   See Function Navigator for Current Functional Status.   Refer to Care Plan for Long Term Goals  Recommendations for other services:  None    Discharge Criteria: Patient will be discharged from OT if patient refuses treatment 3 consecutive times without medical reason, if treatment goals not met, if there is a change in medical status, if patient makes no progress towards goals or if patient is discharged from hospital.  The above assessment, treatment plan, treatment alternatives and goals were discussed and mutually agreed upon: by patient  Gypsy Decant 03/06/2018, 12:43 PM

## 2018-03-06 NOTE — Evaluation (Signed)
Speech Language Pathology Assessment and Plan  Patient Details  Name: Wayne Buckley MRN: [id number] Date of Birth: 1950-09-06  SLP Diagnosis: Dysarthria;Dysphagia;Cognitive Impairments  Rehab Potential: Good ELOS: 28 to 32 days    Today's Date: 03/06/2018 SLP Individual Time: 1330-1430 SLP Individual Time Calculation (min): 60 min   Problem List:  Patient Active Problem List   Diagnosis Date Noted  . Cord compression ([place]) 03/05/2018  . C5-C7 level spinal cord injury ([place])   . Central cord syndrome ([place])   . Neurogenic bladder   . Essential hypertension   . AKI (acute kidney injury) ([place]) 02/26/2018  . Bilateral leg weakness 02/26/2018  . Leg DVT (deep venous thromboembolism), acute, left ([place]) 02/26/2018  . Fall 02/25/2018  . Alcohol abuse 05/04/2012  . Hypokalemia 05/04/2012  . Urinary retention 05/04/2012  . Subdural hematoma, post-traumatic ([place]) 04/28/2012   Past Medical History:  Past Medical History:  Diagnosis Date  . Hypertension   . TBI (traumatic brain injury) ([place]) 2013   Past Surgical History:  Past Surgical History:  Procedure Laterality Date  . ANTERIOR CERVICAL DECOMP/DISCECTOMY FUSION N/A 02/27/2018   Procedure: ANTERIOR CERVICAL DECOMPRESSION/DISCECTOMY FUSION C6-7;  Surgeon: Kary Kos, MD;  Location: [place];  Service: Neurosurgery;  Laterality: N/A;  . CRANIOTOMY  04/28/2012   Procedure: CRANIOTOMY HEMATOMA EVACUATION SUBDURAL;  Surgeon: Faythe Ghee, MD;  Location: [place];  Service: Neurosurgery;  Laterality: Left;  Left Craniotomy for Subdural Hematoma  . RADIOLOGY WITH ANESTHESIA N/A 02/27/2018   Procedure: MRI WITH ANESTHESIA;  Surgeon: Radiologist, Medication, MD;  Location: [place];  Service: Radiology;  Laterality: N/A;  . VENA CAVA FILTER PLACEMENT Right 02/27/2018   Procedure: INSERTION VENA-CAVA FILTER;  Surgeon: Serafina Mitchell, MD;  Location: [place];  Service: Vascular;  Laterality: Right;    Assessment / Plan / Recommendation Clinical  Impression Alyson Locket. Elting is a 67 year old right-handed male with history of alcohol use, hypertension, TBI/SDH 6 years ago craniotomy received inpatient rehab services November 2013. Per chart review he lives with his spouse. Reportedly independent had refused assistive devices. He was driving short distances. 2 level home with bedroom on [address]. Presented 02/25/2018 after a fall x2 with noted lower extremity weakness. Cranial CT scan showed no acute changes. Noted chronic encephalomalacia. Remote left frontal craniotomy as well as remote right occipital skull fracture. Noted vascular studies lower extremities positive for acute DVT left common femoral vein, femoral vein, profunda vein, popliteal vein peroneal vein posterior tibial veins gastrocnemius vein. Left external iliac vein also thrombosed. Underwent placement of IVC filter 02/27/2018 per Dr. Trula Slade. 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 Underwent ACDF and fusion 02/28/2018 per Dr. Saintclair Halsted. Decadron protocol as indicated. Noted leukocytosis secondary to steroids. Intravenous heparin initiated for extensive left lower extremity DVT postoperatively 9/16/201910 plan to begin Eliquis 03/08/2018. Therapy evaluations completed with recommendations of physical medicine rehab consult.   Patient was admitted for a comprehensive rehab program on 03/05/18. At discharge to CIR recommended diet was dysphagia 3 with thin liquids. Order adjusted to reflect this. Per chart review, pt discharged from [place] 6 years prior at [facility] level VII, supervision to [place] A for complex problem solving, anticipatory awareness, working memory, alternating attention and mild word-finding difficulties with complex/abstract thoughts. Also pt discharged from CIR on dysphagia 3 diet with thin liquids.    Currently pt's cognitive-linguistic deficits appear more severe with deficits during this evaluation in semi-complex problem solving, worsened memory  defiicts (inconsistent recall), following 2 step directions,  ataxic dysarthria with decreased speech intelligibility at the phrase level (<50%). Pt with no awareness of current deficits. Additionally, pt is impulsive and demonstrate chorea like movements. This combined with pt's ataxic oral motor movements lead to mild orla residue when consuming dysphagia 3 diet. Therefore recommend full nursing supervision during food consumption. Although pt had word finding deficits during last admission, none noted this evaluation but dysarthria might be impacting/masking word finding deficits during last admission, none noted this evaluation but dysarthria might be impacting/masking word finding deficits. Will continue to assess within functional cognitive tasks. Skilled ST is required to target the above mentioned deficits, return pt to baseline cognitive-linguistic ability but likely anticipate that pt will require 24 hour supervision at discharge from CIR.    Skilled Therapeutic Interventions          Skilled treatment session focused on completion of bedside swallow and cognitive-linguistic evaluations, see above. Pt required Mod A cues to effectively clear oral cavity of dysphagia 3 residue, information updated at bedside and in order for dysphagia 3 diet and full nursing supervision during consumption of food items. Pt was oriented and able to read at the sentence level, would recommend using written information to help with recall of new information. Wife was not present but will verify baseline function at next available opportunity. For present will target goals listed below to further increase functional independence.   SLP Assessment  Patient will need skilled Speech Lanaguage Pathology Services during CIR admission    Recommendations  SLP Diet Recommendations: Dysphagia 3 (Mech soft);Thin Liquid Administration via: Straw;Cup Medication Administration: Whole meds with liquid Supervision: Patient able to  self feed;Full supervision/cueing for compensatory strategies Compensations: Minimize environmental distractions;Slow rate;Small sips/bites;Lingual sweep for clearance of pocketing;Follow solids with liquid Postural Changes and/or Swallow Maneuvers: Seated upright 90 degrees;Upright 30-60 min after meal Oral Care Recommendations: Oral care BID Recommendations for Other Services: Neuropsych consult Patient destination: Home Follow up Recommendations: 24 hour supervision/assistance;Outpatient SLP Equipment Recommended: None recommended by SLP    SLP Frequency 3 to 5 out of 7 days   SLP Duration  SLP Intensity  SLP Treatment/Interventions 28 to 32 days  Minumum of 1-2 x/day, 30 to 90 minutes  Cognitive remediation/compensation;Dysphagia/aspiration precaution training;Environmental controls;Internal/external aids;Functional tasks;Patient/family education;Cueing hierarchy    Pain Pain Assessment Pain Scale: 0-10 Pain Score: 0-No pain  Prior Functioning Cognitive/Linguistic Baseline: Baseline deficits Baseline deficit details: wife not present but chart review indicates mild cognitive deficits from SDH Type of Home: House  Lives With: Spouse Available Help at Discharge: Family;Available 24 hours/day Vocation: Retired  Function:  Eating Eating   Modified Consistency Diet: Yes Eating Assist Level: Help managing cup/glass           Cognition Comprehension Comprehension assist level: Understands basic 75 - 89% of the time/ requires cueing 10 - 24% of the time  Expression   Expression assist level: Expresses basic 50 - 74% of the time/requires cueing 25 - 49% of the time. Needs to repeat parts of sentences.  Social Interaction Social Interaction assist level: Interacts appropriately 75 - 89% of the time - Needs redirection for appropriate language or to initiate interaction.  Problem Solving Problem solving assist level: Solves basic 90% of the time/requires cueing < 10% of the  time  Memory Memory assist level: Recognizes or recalls 75 - 89% of the time/requires cueing 10 - 24% of the time   Short Term Goals: Week 1: SLP Short Term Goal 1 (Week 1): Pt will consume dysphagia 3  diet with thin liquids and Min A for compensatory swallow strategies.  SLP Short Term Goal 2 (Week 1): Pt will utilize compensatory memory strategies to recall dialy activities with Min A cues.  SLP Short Term Goal 3 (Week 1): Pt will demonstrate intellectual awareness by listing 2 physical and 2 cognitive-linguistic/dysphagia deficits related to recent hospitalization with Mod A cues.  SLP Short Term Goal 4 (Week 1): Pt will follow 2 step directions with Min A cues in 9 of 10 opportunities.  SLP Short Term Goal 5 (Week 1): Pt will complete semi-complex problem solving tasks with Mod A cues.  SLP Short Term Goal 6 (Week 1): Pt will implement speech intelligibility strategies at the phrase level to increase speech intelligibility to ~ 75% with Mod A cues.   Refer to Care Plan for Long Term Goals  Recommendations for other services: Neuropsych  Discharge Criteria: Patient will be discharged from SLP if patient refuses treatment 3 consecutive times without medical reason, if treatment goals not met, if there is a change in medical status, if patient makes no progress towards goals or if patient is discharged from hospital.  The above assessment, treatment plan, treatment alternatives and goals were discussed and mutually agreed upon: by patient  Shameek Nyquist 03/06/2018, 2:55 PM

## 2018-03-06 NOTE — Evaluation (Signed)
Physical Therapy Assessment and Plan  Patient Details  Name: DEVONTA BLANFORD MRN: [id number] Date of Birth: [birth year]  PT Diagnosis: Abnormal posture, Abnormality of gait, Ataxia, Cognitive deficits, Coordination disorder and Quadriplegia Rehab Potential: Fair ELOS: 28-32 days   Today's Date: 03/06/2018 PT Individual Time: 0900-1000 PT Individual Time Calculation (min): 60 min    Problem List:  Patient Active Problem List   Diagnosis Date Noted  . Cord compression ([place]) 03/05/2018  . C5-C7 level spinal cord injury ([place])   . Central cord syndrome ([place])   . Neurogenic bladder   . Essential hypertension   . AKI (acute kidney injury) ([place]) 02/26/2018  . Bilateral leg weakness 02/26/2018  . Leg DVT (deep venous thromboembolism), acute, left ([place]) 02/26/2018  . Fall 02/25/2018  . Alcohol abuse 05/04/2012  . Hypokalemia 05/04/2012  . Urinary retention 05/04/2012  . Subdural hematoma, post-traumatic ([place]) 04/28/2012    Past Medical History:  Past Medical History:  Diagnosis Date  . Hypertension   . TBI (traumatic brain injury) ([place]) 2013   Past Surgical History:  Past Surgical History:  Procedure Laterality Date  . ANTERIOR CERVICAL DECOMP/DISCECTOMY FUSION N/A 02/27/2018   Procedure: ANTERIOR CERVICAL DECOMPRESSION/DISCECTOMY FUSION C6-7;  Surgeon: Kary Kos, MD;  Location: [facility];  Service: Neurosurgery;  Laterality: N/A;  . CRANIOTOMY  04/28/2012   Procedure: CRANIOTOMY HEMATOMA EVACUATION SUBDURAL;  Surgeon: Faythe Ghee, MD;  Location: [facility];  Service: Neurosurgery;  Laterality: Left;  Left Craniotomy for Subdural Hematoma  . RADIOLOGY WITH ANESTHESIA N/A 02/27/2018   Procedure: MRI WITH ANESTHESIA;  Surgeon: Radiologist, Medication, MD;  Location: [facility];  Service: Radiology;  Laterality: N/A;  . VENA CAVA FILTER PLACEMENT Right 02/27/2018   Procedure: INSERTION VENA-CAVA FILTER;  Surgeon: Serafina Mitchell, MD;  Location: [facility];  Service: Vascular;  Laterality: Right;     Assessment & Plan Clinical Impression: Alyson Locket. Fenlon is a 67 year old right-handed male with history of alcohol use, hypertension, TBI/SDH 6 years ago craniotomy received inpatient rehab services November 2013.  Per chart review he lives with his spouse.  Reportedly independent had refused assistive devices.  He was driving short distances.  2 level home with master bedroom and bathroom on main level.  Presented 02/25/2018 after a fall x2 with noted lower extremity weakness.  Cranial CT scan showed no acute changes.  Noted chronic encephalomalacia.  Remote left frontal craniotomy as well as remote right occipital skull fracture.  Noted vascular studies lower extremities positive for acute DVT left common femoral vein, femoral vein, profunda vein, popliteal vein peroneal vein posterior tibial veins gastrocnemius vein.  Left external iliac vein also thrombosed.  Underwent placement of IVC filter 02/27/2018 per Dr. Trula Slade.  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  Underwent ACDF and fusion 02/28/2018 per Dr. Saintclair Halsted.  Hospital course pain management.  Decadron protocol as indicated.  Noted leukocytosis secondary to steroids.  Intravenous heparin initiated for extensive left lower extremity DVT postoperatively 03/04/2018 and plan to begin Eliquis 03/08/2018.  Therapy evaluations completed with recommendations of physical medicine rehab consult.  Patient was admitted for a comprehensive rehab program 03/05/18.    Patient transferred to CIR on 03/05/2018 .   Patient currently requires total +2 assist with mobility secondary to muscle weakness, decreased cardiorespiratoy endurance, abnormal tone, unbalanced muscle activation, motor apraxia, ataxia and decreased coordination, decreased motor planning, decreased initiation, decreased attention, decreased awareness, decreased problem solving, decreased safety awareness, decreased memory and delayed processing and decreased sitting balance, decreased standing  balance, decreased postural control, decreased balance strategies and quadraparesis.  Prior to hospitalization, patient was independent  with mobility and lived with Spouse in a House home.  Home access is 4Stairs to enter.  Patient will benefit from skilled PT intervention to maximize safe functional mobility, minimize fall risk and decrease caregiver burden for planned discharge home with 24 hour assist.  Anticipate patient will benefit from follow up [facility] at discharge.  PT - End of Session Activity Tolerance: Tolerates 10 - 20 min activity with multiple rests Endurance Deficit: Yes PT Assessment Rehab Potential (ACUTE/IP ONLY): Fair PT Barriers to Discharge: Inaccessible home environment;Behavior PT Patient demonstrates impairments in the following area(s): Balance;Behavior;Endurance;Motor;Perception;Safety PT Transfers Functional Problem(s): Bed Mobility;Bed to Chair;Car;Furniture PT Locomotion Functional Problem(s): Stairs;Wheelchair Mobility;Ambulation PT Plan PT Intensity: Minimum of 1-2 x/day ,45 to 90 minutes PT Frequency: 5 out of 7 days;Total of 15 hours over 7 days of combined therapies PT Duration Estimated Length of Stay: 28-32 days PT Treatment/Interventions: Ambulation/gait training;DME/adaptive equipment instruction;Neuromuscular re-education;Psychosocial support;Stair training;UE/LE Strength taining/ROM;Wheelchair propulsion/positioning;UE/LE Coordination activities;Therapeutic Activities;Functional electrical stimulation;Discharge planning;Balance/vestibular training;Cognitive remediation/compensation;Functional mobility training;Patient/family education;Splinting/orthotics;Therapeutic Exercise;Visual/perceptual remediation/compensation PT Transfers Anticipated Outcome(s): min assist with LRAD PT Locomotion Anticipated Outcome(s): w/c level supervision PT Recommendation Follow Up Recommendations: Home health PT;24 hour supervision/assistance Patient destination: Home Equipment  Recommended: To be determined  Skilled Therapeutic Intervention No c/o pain.  Session focus on initial PT evaluation and instruction in functional bed mobility and transfers, see below for details.  PT provided education for rehab process, ELOS, and PT plan of care.  Pt requires encouragement to participate throughout entire session, frequently stating that he's still recovering and needs more time to gain strength.  Requires +2 assist to come to partial stand in stedy from elevated bed for transfer to w/c with steady.  Positioned upright with chair alarm intact, call bell in reach and needs met.   PT Evaluation Precautions/Restrictions Precautions Precautions: Fall Restrictions Weight Bearing Restrictions: No Pain Pain Assessment Pain Scale: 0-10 Pain Score: 0-No pain Home Living/Prior Functioning Home Living Available Help at Discharge: Family;Available 24 hours/day Type of Home: House Home Access: Stairs to enter [employer] of Steps: 4 Entrance Stairs-Rails: Left Home Layout: Two level;Able to live on main level with bedroom/bathroom  Lives With: Spouse Prior Function Level of Independence: Independent with transfers;Independent with gait  Able to Take Stairs?: Yes Driving: Yes Vocation: Retired Tax adviser Overall Cognitive Status: Impaired/Different from baseline Orientation Level: Oriented X4 Attention: Sustained;Selective Sustained Attention: Appears intact Selective Attention: Impaired Awareness: Impaired Problem Solving: Impaired Safety/Judgment: Impaired Sensation Sensation Light Touch: Appears Intact Coordination Gross Motor Movements are Fluid and Coordinated: No Fine Motor Movements are Fluid and Coordinated: No Coordination and Movement Description: ataxia noted in BUE and BLEs Heel Shin Test: unable 2/2 weakness Motor  Motor Motor: Ataxia;Motor apraxia;Abnormal postural alignment and control  Mobility Bed Mobility Bed  Mobility: Rolling Right;Right Sidelying to Sit Rolling Right: Maximal Assistance - Patient 25-49% Right Sidelying to Sit: Total Assistance - Patient < 25% Transfers Transfers: Sit to Stand;Stand to Sit;Transfer via Scientist, research (life sciences) to Stand: 2 Helpers Stand to Sit: 2 Press photographer via Geophysicist/field seismologist: Animal nutritionist: No Gait Gait: No Stairs / Additional Locomotion Stairs: No Product manager Mobility: No  Trunk/Postural Assessment  Cervical Assessment Cervical Assessment: Exceptions to WFL(s/p ACDF) Thoracic Assessment Thoracic Assessment: Exceptions to WFL(rounded shoulders) Lumbar Assessment Lumbar Assessment: Exceptions to WFL(posterior pelvic tilt) Postural Control Postural Control: Deficits on evaluation Trunk Control: impaired  Protective Responses: delayed, insufficient Postural Limitations: decreased limits of stability  Balance Balance Balance Assessed: Yes Static Sitting Balance Static Sitting - Balance Support: Right upper extremity supported;Left upper extremity supported;Feet supported Static Sitting - Level of Assistance: 4: Min assist Extremity Assessment      RLE Assessment RLE Assessment: Exceptions to [facility] Passive Range of Motion (PROM) Comments: WFL assessed ins upine Active Range of Motion (AROM) Comments: limited 2/2 weakness General Strength Comments: 2/6 hip flexion and knee flexion, 3/5 knee extension LLE Assessment LLE Assessment: Exceptions to [facility] Passive Range of Motion (PROM) Comments: WFL assessed in sitting Active Range of Motion (AROM) Comments: limited 2/2 weakness General Strength Comments: 1/5 proximal to distal in RLE   See Function Navigator for Current Functional Status.   Refer to Care Plan for Long Term Goals  Recommendations for other services: None   Discharge Criteria: Patient will be discharged from PT if patient refuses treatment 3 consecutive times without medical reason, if  treatment goals not met, if there is a change in medical status, if patient makes no progress towards goals or if patient is discharged from hospital.  The above assessment, treatment plan, treatment alternatives and goals were discussed and mutually agreed upon: by patient  [name] 03/06/2018, 11:08 AM

## 2018-03-06 NOTE — Progress Notes (Signed)
Physical Medicine and Rehabilitation Consult Reason for Consult: Decreased functional mobility Referring Physician: [facility]   HPI: Wayne Buckley is a 66 y.o. right-handed male with history of alcohol use, hypertension, TBI 6 years ago with craniotomy and received inpatient rehab services November 2013.  Per chart review patient lives with spouse.  Reportedly independent has refused assistive devices.  He was driving short distances.  Two-level home with bedroom on [address].  Presented 02/25/2018 after fall x2 with noted lower extremity weakness.  Cranial CT scan showed no acute changes.  Noted chronic encephalomalacia.  Remote left frontal craniotomy as well as remote right occipital skull fracture.  Noted vascular studies lower extremity positive for acute DVT left common femoral vein, femoral vein, profunda vein, popliteal vein peroneal vein posterior tibial veins gastrocnemius vein.  Left external iliac vein also thrombosed.  Underwent placement of IVC filter 02/27/2018 per Dr. Myra Gianotti.  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  Underwent ACDF and fusion at C6-7 02/28/2018 per Dr. Wynetta Emery.  [facility] course pain management.  Decadron protocol as indicated.  Tolerating a soft diet.  Therapy evaluation completed with recommendations of physical medicine rehab consult.   Review of Systems  Constitutional: Negative for chills and fever.  HENT: Negative for hearing loss.   Eyes: Negative for blurred vision and double vision.  Respiratory: Negative for cough and shortness of breath.   Cardiovascular: Negative for chest pain, palpitations and leg swelling.  Gastrointestinal: Positive for constipation. Negative for nausea and vomiting.  Genitourinary: Negative for dysuria, flank pain and hematuria.  Musculoskeletal: Positive for back pain, falls and myalgias.  Skin: Negative for rash.  Neurological: Positive for tingling, speech change and headaches.  All other systems reviewed and are  negative.      Past Medical History:  Diagnosis Date  . Hypertension   . TBI (traumatic brain injury) (HCC) 2013        Past Surgical History:  Procedure Laterality Date  . ANTERIOR CERVICAL DECOMP/DISCECTOMY FUSION N/A 02/27/2018   Procedure: ANTERIOR CERVICAL DECOMPRESSION/DISCECTOMY FUSION C6-7;  Surgeon: Donalee Citrin, MD;  Location: [facility] OR;  Service: Neurosurgery;  Laterality: N/A;  . CRANIOTOMY  04/28/2012   Procedure: CRANIOTOMY HEMATOMA EVACUATION SUBDURAL;  Surgeon: Reinaldo Meeker, MD;  Location: [facility] OR;  Service: Neurosurgery;  Laterality: Left;  Left Craniotomy for Subdural Hematoma  . RADIOLOGY WITH ANESTHESIA N/A 02/27/2018   Procedure: MRI WITH ANESTHESIA;  Surgeon: Radiologist, Medication, MD;  Location: [facility] OR;  Service: Radiology;  Laterality: N/A;  . VENA CAVA FILTER PLACEMENT Right 02/27/2018   Procedure: INSERTION VENA-CAVA FILTER;  Surgeon: Nada Libman, MD;  Location: [facility] OR;  Service: Vascular;  Laterality: Right;        Family History  Problem Relation Age of Onset  . Heart disease Father    Social History:  reports that he has never smoked. He has never used smokeless tobacco. He reports that he drinks alcohol. His drug history is not on file. Allergies: No Known Allergies       Medications Prior to Admission  Medication Sig Dispense Refill  . Ascorbic Acid (VITAMIN C) 1000 MG tablet Take 1,000 mg by mouth daily.     Marland Kitchen aspirin EC 325 MG tablet Take 325 mg by mouth 2 (two) times daily.    . Multiple Vitamin (MULTIVITAMIN WITH MINERALS) TABS Take 1 tablet by mouth daily.    . feeding supplement (ENSURE COMPLETE) LIQD Take 237 mLs by mouth 2 (two) times daily between meals. (Patient not  taking: Reported on 02/26/2018)    . folic acid (FOLVITE) 1 MG tablet Take 1 tablet (1 mg total) by mouth daily. (Patient not taking: Reported on 02/26/2018) 30 tablet 1  . pantoprazole (PROTONIX) 40 MG tablet Take 1 tablet (40 mg total) by mouth daily at 12 noon.  (Patient not taking: Reported on 02/26/2018) 30 tablet 1  . thiamine 250 MG tablet Take 1 tablet (250 mg total) by mouth daily. (Patient not taking: Reported on 02/26/2018) 30 tablet 1    Home: Home Living Family/patient expects to be discharged to:: Private residence Living Arrangements: Spouse/significant other Available Help at Discharge: Family, Available 24 hours/day Type of Home: House Home Access: Stairs to enter Entergy CorporationEntrance Stairs-Number of Steps: 4 Home Layout: Two level, Able to live on main level with bedroom/bathroom Bathroom Shower/Tub: Health visitorWalk-in shower Bathroom Toilet: Standard Home Equipment: Environmental consultantWalker - 2 wheels, Cane - single point  Functional History: Prior Function Level of Independence: Independent Comments: refused to use any device for ambulation, pt was driving, retired Investment banker, operationalchef, wife describes pt as "stubborn" Functional Status:  Mobility: Bed Mobility Overal bed mobility: Needs Assistance Bed Mobility: Supine to Sit Supine to sit: +2 for physical assistance, Max assist General bed mobility comments: step by step verbal cues and assist for LEs extremities to EOB, to raise trunk and position hips at EOB with pad Transfers Overall transfer level: Needs assistance Transfer via Lift Equipment: [place]  ADL: ADL Overall ADL's : Needs assistance/impaired Eating/Feeding: Supervision/ safety, Set up, Sitting Grooming: Moderate assistance, Sitting Upper Body Bathing: Maximal assistance, Sitting Lower Body Bathing: Total assistance, Bed level Upper Body Dressing : Maximal assistance, Sitting Lower Body Dressing: Total assistance, Bed level Functional mobility during ADLs: (requires lift)  Cognition: Cognition Overall Cognitive Status: Impaired/Different from baseline Orientation Level: Oriented X4 Cognition Arousal/Alertness: Awake/alert Behavior During Therapy: Impulsive Overall Cognitive Status: Impaired/Different from baseline Area of Impairment: Orientation,  Attention, Memory, Following commands, Safety/judgement, Awareness, Problem solving Orientation Level: Disoriented to, Time(new date) Current Attention Level: Focused Memory: Decreased short-term memory Following Commands: Follows one step commands with increased time(and mulitmodal cues) Safety/Judgement: Decreased awareness of safety, Decreased awareness of deficits Awareness: Intellectual Problem Solving: Slow processing, Decreased initiation, Difficulty sequencing, Requires verbal cues, Requires tactile cues General Comments: pt realizing he should not attempt standing once he was seated at EOB  Blood pressure (!) 173/94, pulse (!) 103, temperature 98.3 F (36.8 C), temperature source Oral, resp. rate 16, height 5' 10.98" (1.803 m), weight 96.9 kg, SpO2 98 %. Physical Exam  Vitals reviewed. Constitutional: He appears well-developed. No distress.  HENT:  Head: Normocephalic.  Eyes: EOM are normal.  Neck: Normal range of motion. Neck supple.  ACDF site clean and dry  Cardiovascular: Normal rate and regular rhythm.  Respiratory: Effort normal and breath sounds normal. No respiratory distress.  GI: Soft. Bowel sounds are normal. He exhibits no distension.  Neurological: He is alert.  Patient is very talkative and at times difficult to redirect.  He does provide his name and age.  Follows simple commands.  Speech is dysarthric but intelligible    LabResultsLast24Hours       Results for orders placed or performed during the hospital encounter of 02/25/18 (from the past 24 hour(s))  Urinalysis, Routine w reflex microscopic     Status: Abnormal   Collection Time: 02/28/18 10:20 PM  Result Value Ref Range   Color, Urine YELLOW YELLOW   APPearance HAZY (A) CLEAR   Specific Gravity, Urine 1.011 1.005 - 1.030  pH 5.0 5.0 - 8.0   Glucose, UA NEGATIVE NEGATIVE mg/dL   Hgb urine dipstick NEGATIVE NEGATIVE   Bilirubin Urine NEGATIVE NEGATIVE   Ketones, ur NEGATIVE  NEGATIVE mg/dL   Protein, ur NEGATIVE NEGATIVE mg/dL   Nitrite NEGATIVE NEGATIVE   Leukocytes, UA SMALL (A) NEGATIVE   RBC / HPF 0-5 0 - 5 RBC/hpf   WBC, UA 6-10 0 - 5 WBC/hpf   Bacteria, UA NONE SEEN NONE SEEN   Mucus PRESENT    Hyaline Casts, UA PRESENT   Sodium, urine, random     Status: None   Collection Time: 02/28/18 10:20 PM  Result Value Ref Range   Sodium, Ur 71 mmol/L  Basic metabolic panel     Status: Abnormal   Collection Time: 03/01/18  2:40 AM  Result Value Ref Range   Sodium 137 135 - 145 mmol/L   Potassium 3.8 3.5 - 5.1 mmol/L   Chloride 105 98 - 111 mmol/L   CO2 23 22 - 32 mmol/L   Glucose, Bld 154 (H) 70 - 99 mg/dL   BUN 21 8 - 23 mg/dL   Creatinine, Ser 1.61 0.61 - 1.24 mg/dL   Calcium 8.2 (L) 8.9 - 10.3 mg/dL   GFR calc non Af Amer >60 >60 mL/min   GFR calc Af Amer >60 >60 mL/min   Anion gap 9 5 - 15      ImagingResults(Last48hours)  Dg Cervical Spine 2 Or 3 Views  Result Date: 02/28/2018 CLINICAL DATA:  Cervical decompression and fusion at C6-7 EXAM: DG C-ARM 61-120 MIN; CERVICAL SPINE - 2-3 VIEW COMPARISON:  None. FLUOROSCOPY TIME:  Radiation Exposure Index (as provided by the fluoroscopic device): Not available If the device does not provide the exposure index: Fluoroscopy Time:  18 seconds Number of Acquired Images:  2 FINDINGS: Initial lateral images demonstrate diffuse degenerative changes of the cervical spine. The inferior aspect of the cervical spine is not well visualized due to overlying shoulder artifact. Second image demonstrates evidence of fusion at C6-7 with anterior fixation. IMPRESSION: C6-7 fusion. Electronically Signed   By: Alcide Clever M.D.   On: 02/28/2018 09:52   Mr Cervical Spine Wo Contrast  Result Date: 02/27/2018 CLINICAL DATA:  Initial evaluation for generalized muscle weakness status post recent fall on 02/21/2018. EXAM: MRI CERVICAL, THORACIC AND LUMBAR SPINE WITHOUT CONTRAST TECHNIQUE: Multiplanar  and multiecho pulse sequences of the cervical spine, to include the craniocervical junction and cervicothoracic junction, and thoracic and lumbar spine, were obtained without intravenous contrast. COMPARISON:  Prior CT from 02/25/2018 FINDINGS: MRI CERVICAL SPINE FINDINGS Alignment: Straightening with smooth reversal of the normal cervical lordosis. Trace anterolisthesis of C2 on C3, stable. Vertebrae: Vertebral body heights maintained without evidence for acute or chronic fracture. Heterogeneous bone marrow signal intensity seen at C3-4 through T1-2 due to underlying degenerative spondylolysis. No discrete or worrisome osseous lesions. Cord: Patchy abnormal T2 signal abnormality seen within the cervical spinal cord at the level of C6-7, concerning for possible cord contusion/edema given provided history. No associated hemorrhage. Signal intensity within the cervical spinal cord otherwise normal. Posterior Fossa, vertebral arteries, paraspinal tissues: Visualized brain demonstrates no acute abnormality. Retrocerebellar cystic space could reflect mega cisterna magna versus arachnoid cyst. Craniocervical junction normal. Soft tissue edema adjacent to the right C6-7 facet, suspected to be posttraumatic in nature given adjacent cord injury (series 5, image 2). No definite discrete fracture line about the adjacent facet. Additionally, mild soft tissue edema within the posterior soft tissues adjacent to the  spinous processes of C5 through C7, also likely posttraumatic (series 5, image 8). Patient is intubated with fluid in the nasopharynx. Abnormal flow void within the left vertebral artery, likely occluded. Finding is age indeterminate. Normal flow void within the dominant right vertebral artery. Disc levels: C2-C3: Trace anterolisthesis. Shallow central disc protrusion mildly indents the ventral thecal sac. Prominent left-sided facet degeneration. Resultant moderate left C3 foraminal stenosis. No significant canal  narrowing. Right neural foraminal on the patent. C3-C4: Diffuse degenerative disc osteophyte with intervertebral disc space narrowing. Broad posterior component, asymmetric to the left flattens and partially faces the ventral thecal sac with resultant mild spinal stenosis. No significant cord deformity. Superimposed bilateral facet hypertrophy. Resultant severe left with moderate right C4 foraminal narrowing. C4-C5: Diffuse degenerative disc osteophyte with intervertebral disc space narrowing with bilateral facet hypertrophy. Flattening of the ventral thecal sac with resultant mild spinal stenosis. Severe bilateral C5 foraminal narrowing. C5-C6: Diffuse disc osteophyte with intervertebral disc space narrowing. Broad posterior component indents and partially faces the ventral CSF. Resultant mild to moderate spinal stenosis without cord deformity. Bilateral facet hypertrophy. Resultant moderate bilateral C6 foraminal narrowing. C6-C7: Diffuse degenerative disc osteophyte with intervertebral disc space narrowing and bilateral uncovertebral hypertrophy. Broad posterior disc osteophyte complex (mostly disc) effaces the ventral thecal sac, impinging upon the cervical spinal cord. Superimposed facet and ligament flavum hypertrophy. Resultant severe spinal stenosis with cord flattening. Thecal sac measures approximately 4 mm in AP diameter at its most narrow point. Associated cord signal abnormality, likely contusion/edema. Severe bilateral C7 foraminal stenosis. C7-T1: Diffuse degenerative disc osteophyte with intervertebral disc space narrowing and left greater than right uncovertebral spurring. No significant spinal stenosis. Severe bilateral C8 foraminal stenosis. MRI THORACIC SPINE FINDINGS Alignment: Vertebral bodies normally aligned with preservation of the normal thoracic kyphosis. No listhesis. Vertebrae: Vertebral body heights maintained without evidence for acute or chronic fracture. Bone marrow signal intensity  within normal limits. Benign hemangioma noted within the T8 vertebral body. No other discrete or worrisome osseous lesions. No abnormal marrow edema. Cord: Signal intensity within the thoracic spinal cord is normal. No evidence for traumatic cord injury. Paraspinal and other soft tissues: Paraspinous soft tissues demonstrate no acute finding. Patchy opacity within the partially visualized posterior right lower lobe, which could reflect atelectasis or infiltrate. Partially visualized visceral structures otherwise grossly unremarkable. Disc levels: T1-2: Mild diffuse disc bulge with bilateral facet hypertrophy. No significant spinal stenosis. Mild to moderate bilateral foraminal narrowing. T7-8: Tiny central disc protrusion mildly indents the ventral thecal sac. No significant stenosis. T8-9: Central disc protrusion indents the ventral thecal sac. Minimal flattening of the ventral cord without significant stenosis. T10-11: Minimal disc bulge. Bilateral facet hypertrophy. No spinal stenosis. Mild left neural foraminal narrowing. T11-12: Mild disc bulge. Bilateral facet hypertrophy. No significant spinal stenosis. Moderate left with mild right neural foraminal narrowing. T12-L1: Diffuse disc bulge. Left-sided facet hypertrophy. No significant spinal stenosis. Mild left neural foraminal narrowing. MRI LUMBAR SPINE FINDINGS Segmentation: Normal segmentation. Lowest well-formed disc labeled the L5-S1 level. Alignment: Levoscoliosis. Alignment otherwise normal with preservation of the normal lumbar lordosis. No listhesis. Vertebrae: Vertebral body heights maintained without evidence for acute or chronic fracture. There is an acute nondisplaced fracture extending through the S2 segment (series 18, image 8). No other definite fracture identified. Underlying bone marrow signal intensity heterogeneous but within normal limits. No worrisome osseous lesions. Conus medullaris and cauda equina: Conus extends to the T12 level. Conus  and cauda equina appear normal. Paraspinal and other soft tissues: Visualized paraspinous  soft tissues demonstrate no acute finding. Few scattered renal cysts noted bilaterally. Visualized visceral structures otherwise unremarkable. Disc levels: L1-2: Diffuse disc bulge with disc desiccation and intervertebral disc space narrowing. Right-sided reactive endplate changes. Mild facet hypertrophy. No canal stenosis. Foramina remain patent. L2-3: Chronic intervertebral disc space narrowing with diffuse disc bulge and disc desiccation. Mild facet hypertrophy. Resultant mild spinal stenosis. Mild right L2 foraminal narrowing. L3-4: Chronic intervertebral disc space narrowing with diffuse disc bulge and disc desiccation. Disc bulging asymmetric to the right with right-sided reactive endplate changes. Moderate facet and ligament flavum hypertrophy. Resultant mild canal with bilateral lateral recess stenosis. Mild to moderate bilateral L3 foraminal narrowing. L4-5: Chronic intervertebral disc space narrowing with diffuse disc bulge and disc desiccation. Moderate facet ligament flavum hypertrophy. Resultant moderate canal with bilateral subarticular stenosis. Moderate left with mild to moderate right L4 foraminal narrowing. L5-S1: Shallow posterior disc bulge. Severe bilateral facet arthrosis. No made of a small 5 mm synovial cyst at the anteromedial aspect of the right L5-S1 facet. Resultant mild canal with moderate bilateral lateral recess narrowing. Foramina remain patent. IMPRESSION: MRI CERVICAL SPINE IMPRESSION: 1. Degenerative disc osteophyte with facet hypertrophy at C6-7 with resultant severe spinal stenosis and secondary cord compression. Abnormal cord signal intensity at this level most likely reflects contusion/edema related to recent trauma/fall. Neurosurgical consultation recommended. 2. Soft tissue edema adjacent to the right C6-7 facet as well as the C5 through C7 spinous processes, likely posttraumatic in  nature given the adjacent cord injury. No definite discrete fracture line identified. 3. 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 Associated multilevel moderate to severe foraminal narrowing as detailed above. MRI THORACIC SPINE IMPRESSION: 1. No acute traumatic injury within the thoracic spine. 2. Multilevel degenerative spondylolysis with small disc protrusions at T7-8 and T8-9. No significant spinal stenosis. MRI LUMBAR SPINE IMPRESSION: 1. Acute nondisplaced sacral fracture, S2 segment. 2. 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 Resultant mild to moderate diffuse spinal stenosis, most pronounced at the L4-5 level. 3. Multifactorial degenerative changes with resultant multilevel foraminal narrowing as above. Notable findings include mild right L2, with mild to moderate bilateral L3 and L4 foraminal stenosis. Findings were discussed by telephone with Dr. Randol Kern at 2:45 p.m. on 02/27/2018. Electronically Signed   By: Rise Mu M.D.   On: 02/27/2018 14:46   Mr Thoracic Spine Wo Contrast  Result Date: 02/27/2018 CLINICAL DATA:  Initial evaluation for generalized muscle weakness status post recent fall on 02/21/2018. EXAM: MRI CERVICAL, THORACIC AND LUMBAR SPINE WITHOUT CONTRAST TECHNIQUE: Multiplanar and multiecho pulse sequences of the cervical spine, to include the craniocervical junction and cervicothoracic junction, and thoracic and lumbar spine, were obtained without intravenous contrast. COMPARISON:  Prior CT from 02/25/2018 FINDINGS: MRI CERVICAL SPINE FINDINGS Alignment: Straightening with smooth reversal of the normal cervical lordosis. Trace anterolisthesis of C2 on C3, stable. Vertebrae: Vertebral body heights maintained without evidence for acute or chronic fracture. Heterogeneous bone marrow signal intensity seen at C3-4 through T1-2 due to underlying degenerative spondylolysis.  No discrete or worrisome osseous lesions. Cord: Patchy abnormal T2 signal abnormality seen within the cervical spinal cord at the level of C6-7, concerning for possible cord contusion/edema given provided history. No associated hemorrhage. Signal intensity within the cervical spinal cord otherwise normal. Posterior Fossa, vertebral arteries, paraspinal tissues: Visualized brain demonstrates no acute abnormality. Retrocerebellar cystic space could reflect mega cisterna magna versus arachnoid cyst. Craniocervical junction normal. Soft tissue edema adjacent to the right  C6-7 facet, suspected to be posttraumatic in nature given adjacent cord injury (series 5, image 2). No definite discrete fracture line about the adjacent facet. Additionally, mild soft tissue edema within the posterior soft tissues adjacent to the spinous processes of C5 through C7, also likely posttraumatic (series 5, image 8). Patient is intubated with fluid in the nasopharynx. Abnormal flow void within the left vertebral artery, likely occluded. Finding is age indeterminate. Normal flow void within the dominant right vertebral artery. Disc levels: C2-C3: Trace anterolisthesis. Shallow central disc protrusion mildly indents the ventral thecal sac. Prominent left-sided facet degeneration. Resultant moderate left C3 foraminal stenosis. No significant canal narrowing. Right neural foraminal on the patent. C3-C4: Diffuse degenerative disc osteophyte with intervertebral disc space narrowing. Broad posterior component, asymmetric to the left flattens and partially faces the ventral thecal sac with resultant mild spinal stenosis. No significant cord deformity. Superimposed bilateral facet hypertrophy. Resultant severe left with moderate right C4 foraminal narrowing. C4-C5: Diffuse degenerative disc osteophyte with intervertebral disc space narrowing with bilateral facet hypertrophy. Flattening of the ventral thecal sac with resultant mild spinal stenosis.  Severe bilateral C5 foraminal narrowing. C5-C6: Diffuse disc osteophyte with intervertebral disc space narrowing. Broad posterior component indents and partially faces the ventral CSF. Resultant mild to moderate spinal stenosis without cord deformity. Bilateral facet hypertrophy. Resultant moderate bilateral C6 foraminal narrowing. C6-C7: Diffuse degenerative disc osteophyte with intervertebral disc space narrowing and bilateral uncovertebral hypertrophy. Broad posterior disc osteophyte complex (mostly disc) effaces the ventral thecal sac, impinging upon the cervical spinal cord. Superimposed facet and ligament flavum hypertrophy. Resultant severe spinal stenosis with cord flattening. Thecal sac measures approximately 4 mm in AP diameter at its most narrow point. Associated cord signal abnormality, likely contusion/edema. Severe bilateral C7 foraminal stenosis. C7-T1: Diffuse degenerative disc osteophyte with intervertebral disc space narrowing and left greater than right uncovertebral spurring. No significant spinal stenosis. Severe bilateral C8 foraminal stenosis. MRI THORACIC SPINE FINDINGS Alignment: Vertebral bodies normally aligned with preservation of the normal thoracic kyphosis. No listhesis. Vertebrae: Vertebral body heights maintained without evidence for acute or chronic fracture. Bone marrow signal intensity within normal limits. Benign hemangioma noted within the T8 vertebral body. No other discrete or worrisome osseous lesions. No abnormal marrow edema. Cord: Signal intensity within the thoracic spinal cord is normal. No evidence for traumatic cord injury. Paraspinal and other soft tissues: Paraspinous soft tissues demonstrate no acute finding. Patchy opacity within the partially visualized posterior right lower lobe, which could reflect atelectasis or infiltrate. Partially visualized visceral structures otherwise grossly unremarkable. Disc levels: T1-2: Mild diffuse disc bulge with bilateral facet  hypertrophy. No significant spinal stenosis. Mild to moderate bilateral foraminal narrowing. T7-8: Tiny central disc protrusion mildly indents the ventral thecal sac. No significant stenosis. T8-9: Central disc protrusion indents the ventral thecal sac. Minimal flattening of the ventral cord without significant stenosis. T10-11: Minimal disc bulge. Bilateral facet hypertrophy. No spinal stenosis. Mild left neural foraminal narrowing. T11-12: Mild disc bulge. Bilateral facet hypertrophy. No significant spinal stenosis. Moderate left with mild right neural foraminal narrowing. T12-L1: Diffuse disc bulge. Left-sided facet hypertrophy. No significant spinal stenosis. Mild left neural foraminal narrowing. MRI LUMBAR SPINE FINDINGS Segmentation: Normal segmentation. Lowest well-formed disc labeled the L5-S1 level. Alignment: Levoscoliosis. Alignment otherwise normal with preservation of the normal lumbar lordosis. No listhesis. Vertebrae: Vertebral body heights maintained without evidence for acute or chronic fracture. There is an acute nondisplaced fracture extending through the S2 segment (series 18, image 8). No other definite fracture identified. Underlying  bone marrow signal intensity heterogeneous but within normal limits. No worrisome osseous lesions. Conus medullaris and cauda equina: Conus extends to the T12 level. Conus and cauda equina appear normal. Paraspinal and other soft tissues: Visualized paraspinous soft tissues demonstrate no acute finding. Few scattered renal cysts noted bilaterally. Visualized visceral structures otherwise unremarkable. Disc levels: L1-2: Diffuse disc bulge with disc desiccation and intervertebral disc space narrowing. Right-sided reactive endplate changes. Mild facet hypertrophy. No canal stenosis. Foramina remain patent. L2-3: Chronic intervertebral disc space narrowing with diffuse disc bulge and disc desiccation. Mild facet hypertrophy. Resultant mild spinal stenosis. Mild right  L2 foraminal narrowing. L3-4: Chronic intervertebral disc space narrowing with diffuse disc bulge and disc desiccation. Disc bulging asymmetric to the right with right-sided reactive endplate changes. Moderate facet and ligament flavum hypertrophy. Resultant mild canal with bilateral lateral recess stenosis. Mild to moderate bilateral L3 foraminal narrowing. L4-5: Chronic intervertebral disc space narrowing with diffuse disc bulge and disc desiccation. Moderate facet ligament flavum hypertrophy. Resultant moderate canal with bilateral subarticular stenosis. Moderate left with mild to moderate right L4 foraminal narrowing. L5-S1: Shallow posterior disc bulge. Severe bilateral facet arthrosis. No made of a small 5 mm synovial cyst at the anteromedial aspect of the right L5-S1 facet. Resultant mild canal with moderate bilateral lateral recess narrowing. Foramina remain patent. IMPRESSION: MRI CERVICAL SPINE IMPRESSION: 1. Degenerative disc osteophyte with facet hypertrophy at C6-7 with resultant severe spinal stenosis and secondary cord compression. Abnormal cord signal intensity at this level most likely reflects contusion/edema related to recent trauma/fall. Neurosurgical consultation recommended. 2. Soft tissue edema adjacent to the right C6-7 facet as well as the C5 through C7 spinous processes, likely posttraumatic in nature given the adjacent cord injury. No definite discrete fracture line identified. 3. 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 Associated multilevel moderate to severe foraminal narrowing as detailed above. MRI THORACIC SPINE IMPRESSION: 1. No acute traumatic injury within the thoracic spine. 2. Multilevel degenerative spondylolysis with small disc protrusions at T7-8 and T8-9. No significant spinal stenosis. MRI LUMBAR SPINE IMPRESSION: 1. Acute nondisplaced sacral fracture, S2 segment. 2. Levoscoliosis with moderate to severe  multilevel degenerative spondylolysis. Resultant mild to moderate diffuse spinal stenosis, most pronounced at the L4-5 level. 3. Multifactorial degenerative changes with resultant multilevel foraminal narrowing as above. Notable findings include mild right L2, with mild to moderate bilateral L3 and L4 foraminal stenosis. Findings were discussed by telephone with Dr. Randol Kern at 2:45 p.m. on 02/27/2018. Electronically Signed   By: Rise Mu M.D.   On: 02/27/2018 14:46   Mr Lumbar Spine Wo Contrast  Result Date: 02/27/2018 CLINICAL DATA:  Initial evaluation for generalized muscle weakness status post recent fall on 02/21/2018. EXAM: MRI CERVICAL, THORACIC AND LUMBAR SPINE WITHOUT CONTRAST TECHNIQUE: Multiplanar and multiecho pulse sequences of the cervical spine, to include the craniocervical junction and cervicothoracic junction, and thoracic and lumbar spine, were obtained without intravenous contrast. COMPARISON:  Prior CT from 02/25/2018 FINDINGS: MRI CERVICAL SPINE FINDINGS Alignment: Straightening with smooth reversal of the normal cervical lordosis. Trace anterolisthesis of C2 on C3, stable. Vertebrae: Vertebral body heights maintained without evidence for acute or chronic fracture. Heterogeneous bone marrow signal intensity seen at C3-4 through T1-2 due to underlying degenerative spondylolysis. No discrete or worrisome osseous lesions. Cord: Patchy abnormal T2 signal abnormality seen within the cervical spinal cord at the level of C6-7, concerning for possible cord contusion/edema given provided history. No associated hemorrhage. Signal intensity within the cervical  spinal cord otherwise normal. Posterior Fossa, vertebral arteries, paraspinal tissues: Visualized brain demonstrates no acute abnormality. Retrocerebellar cystic space could reflect mega cisterna magna versus arachnoid cyst. Craniocervical junction normal. Soft tissue edema adjacent to the right C6-7 facet, suspected to be  posttraumatic in nature given adjacent cord injury (series 5, image 2). No definite discrete fracture line about the adjacent facet. Additionally, mild soft tissue edema within the posterior soft tissues adjacent to the spinous processes of C5 through C7, also likely posttraumatic (series 5, image 8). Patient is intubated with fluid in the nasopharynx. Abnormal flow void within the left vertebral artery, likely occluded. Finding is age indeterminate. Normal flow void within the dominant right vertebral artery. Disc levels: C2-C3: Trace anterolisthesis. Shallow central disc protrusion mildly indents the ventral thecal sac. Prominent left-sided facet degeneration. Resultant moderate left C3 foraminal stenosis. No significant canal narrowing. Right neural foraminal on the patent. C3-C4: Diffuse degenerative disc osteophyte with intervertebral disc space narrowing. Broad posterior component, asymmetric to the left flattens and partially faces the ventral thecal sac with resultant mild spinal stenosis. No significant cord deformity. Superimposed bilateral facet hypertrophy. Resultant severe left with moderate right C4 foraminal narrowing. C4-C5: Diffuse degenerative disc osteophyte with intervertebral disc space narrowing with bilateral facet hypertrophy. Flattening of the ventral thecal sac with resultant mild spinal stenosis. Severe bilateral C5 foraminal narrowing. C5-C6: Diffuse disc osteophyte with intervertebral disc space narrowing. Broad posterior component indents and partially faces the ventral CSF. Resultant mild to moderate spinal stenosis without cord deformity. Bilateral facet hypertrophy. Resultant moderate bilateral C6 foraminal narrowing. C6-C7: Diffuse degenerative disc osteophyte with intervertebral disc space narrowing and bilateral uncovertebral hypertrophy. Broad posterior disc osteophyte complex (mostly disc) effaces the ventral thecal sac, impinging upon the cervical spinal cord. Superimposed  facet and ligament flavum hypertrophy. Resultant severe spinal stenosis with cord flattening. Thecal sac measures approximately 4 mm in AP diameter at its most narrow point. Associated cord signal abnormality, likely contusion/edema. Severe bilateral C7 foraminal stenosis. C7-T1: Diffuse degenerative disc osteophyte with intervertebral disc space narrowing and left greater than right uncovertebral spurring. No significant spinal stenosis. Severe bilateral C8 foraminal stenosis. MRI THORACIC SPINE FINDINGS Alignment: Vertebral bodies normally aligned with preservation of the normal thoracic kyphosis. No listhesis. Vertebrae: Vertebral body heights maintained without evidence for acute or chronic fracture. Bone marrow signal intensity within normal limits. Benign hemangioma noted within the T8 vertebral body. No other discrete or worrisome osseous lesions. No abnormal marrow edema. Cord: Signal intensity within the thoracic spinal cord is normal. No evidence for traumatic cord injury. Paraspinal and other soft tissues: Paraspinous soft tissues demonstrate no acute finding. Patchy opacity within the partially visualized posterior right lower lobe, which could reflect atelectasis or infiltrate. Partially visualized visceral structures otherwise grossly unremarkable. Disc levels: T1-2: Mild diffuse disc bulge with bilateral facet hypertrophy. No significant spinal stenosis. Mild to moderate bilateral foraminal narrowing. T7-8: Tiny central disc protrusion mildly indents the ventral thecal sac. No significant stenosis. T8-9: Central disc protrusion indents the ventral thecal sac. Minimal flattening of the ventral cord without significant stenosis. T10-11: Minimal disc bulge. Bilateral facet hypertrophy. No spinal stenosis. Mild left neural foraminal narrowing. T11-12: Mild disc bulge. Bilateral facet hypertrophy. No significant spinal stenosis. Moderate left with mild right neural foraminal narrowing. T12-L1: Diffuse disc  bulge. Left-sided facet hypertrophy. No significant spinal stenosis. Mild left neural foraminal narrowing. MRI LUMBAR SPINE FINDINGS Segmentation: Normal segmentation. Lowest well-formed disc labeled the L5-S1 level. Alignment: Levoscoliosis. Alignment otherwise normal with preservation of the  normal lumbar lordosis. No listhesis. Vertebrae: Vertebral body heights maintained without evidence for acute or chronic fracture. There is an acute nondisplaced fracture extending through the S2 segment (series 18, image 8). No other definite fracture identified. Underlying bone marrow signal intensity heterogeneous but within normal limits. No worrisome osseous lesions. Conus medullaris and cauda equina: Conus extends to the T12 level. Conus and cauda equina appear normal. Paraspinal and other soft tissues: Visualized paraspinous soft tissues demonstrate no acute finding. Few scattered renal cysts noted bilaterally. Visualized visceral structures otherwise unremarkable. Disc levels: L1-2: Diffuse disc bulge with disc desiccation and intervertebral disc space narrowing. Right-sided reactive endplate changes. Mild facet hypertrophy. No canal stenosis. Foramina remain patent. L2-3: Chronic intervertebral disc space narrowing with diffuse disc bulge and disc desiccation. Mild facet hypertrophy. Resultant mild spinal stenosis. Mild right L2 foraminal narrowing. L3-4: Chronic intervertebral disc space narrowing with diffuse disc bulge and disc desiccation. Disc bulging asymmetric to the right with right-sided reactive endplate changes. Moderate facet and ligament flavum hypertrophy. Resultant mild canal with bilateral lateral recess stenosis. Mild to moderate bilateral L3 foraminal narrowing. L4-5: Chronic intervertebral disc space narrowing with diffuse disc bulge and disc desiccation. Moderate facet ligament flavum hypertrophy. Resultant moderate canal with bilateral subarticular stenosis. Moderate left with mild to moderate  right L4 foraminal narrowing. L5-S1: Shallow posterior disc bulge. Severe bilateral facet arthrosis. No made of a small 5 mm synovial cyst at the anteromedial aspect of the right L5-S1 facet. Resultant mild canal with moderate bilateral lateral recess narrowing. Foramina remain patent. IMPRESSION: MRI CERVICAL SPINE IMPRESSION: 1. Degenerative disc osteophyte with facet hypertrophy at C6-7 with resultant severe spinal stenosis and secondary cord compression. Abnormal cord signal intensity at this level most likely reflects contusion/edema related to recent trauma/fall. Neurosurgical consultation recommended. 2. Soft tissue edema adjacent to the right C6-7 facet as well as the C5 through C7 spinous processes, likely posttraumatic in nature given the adjacent cord injury. No definite discrete fracture line identified. 3. 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 Associated multilevel moderate to severe foraminal narrowing as detailed above. MRI THORACIC SPINE IMPRESSION: 1. No acute traumatic injury within the thoracic spine. 2. Multilevel degenerative spondylolysis with small disc protrusions at T7-8 and T8-9. No significant spinal stenosis. MRI LUMBAR SPINE IMPRESSION: 1. Acute nondisplaced sacral fracture, S2 segment. 2. 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 Resultant mild to moderate diffuse spinal stenosis, most pronounced at the L4-5 level. 3. Multifactorial degenerative changes with resultant multilevel foraminal narrowing as above. Notable findings include mild right L2, with mild to moderate bilateral L3 and L4 foraminal stenosis. Findings were discussed by telephone with Dr. Randol Kern at 2:45 p.m. on 02/27/2018. Electronically Signed   By: Rise Mu M.D.   On: 02/27/2018 14:46   Dg C-arm 1-60 Min  Result Date: 02/28/2018 CLINICAL DATA:  Cervical decompression and fusion at C6-7 EXAM:  DG C-ARM 61-120 MIN; CERVICAL SPINE - 2-3 VIEW COMPARISON:  None. FLUOROSCOPY TIME:  Radiation Exposure Index (as provided by the fluoroscopic device): Not available If the device does not provide the exposure index: Fluoroscopy Time:  18 seconds Number of Acquired Images:  2 FINDINGS: Initial lateral images demonstrate diffuse degenerative changes of the cervical spine. The inferior aspect of the cervical spine is not well visualized due to overlying shoulder artifact. Second image demonstrates evidence of fusion at C6-7 with anterior fixation. IMPRESSION: C6-7 fusion. Electronically Signed   By: Eulah Pont.D.  On: 02/28/2018 09:52   Dg C-arm 1-60 Min  Result Date: 02/28/2018 CLINICAL DATA:  Cervical decompression and fusion at C6-7 EXAM: DG C-ARM 61-120 MIN; CERVICAL SPINE - 2-3 VIEW COMPARISON:  None. FLUOROSCOPY TIME:  Radiation Exposure Index (as provided by the fluoroscopic device): Not available If the device does not provide the exposure index: Fluoroscopy Time:  18 seconds Number of Acquired Images:  2 FINDINGS: Initial lateral images demonstrate diffuse degenerative changes of the cervical spine. The inferior aspect of the cervical spine is not well visualized due to overlying shoulder artifact. Second image demonstrates evidence of fusion at C6-7 with anterior fixation. IMPRESSION: C6-7 fusion. Electronically Signed   By: Alcide Clever M.D.   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  1. Does the need for close, 24 hr/day medical supervision in concert with the patient's rehab needs make it unreasonable for this patient to be served in a less intensive setting? Yes 2. Co-Morbidities requiring supervision/potential complications: HTN, ETOH  use 3. Due to bladder management, bowel management, safety, skin/wound care, disease management, medication administration, pain management and patient education, does the patient require 24 hr/day rehab nursing? Yes 4. Does the patient require coordinated care of a physician, rehab nurse, PT (1-2 hrs/day, 5 days/week) and OT (1-2 hrs/day, 5 days/week) to address physical and functional deficits in the context of the above medical diagnosis(es)? Yes Addressing deficits in the following areas: balance, endurance, locomotion, strength, transferring, bowel/bladder control, bathing, dressing, feeding, grooming, toileting and psychosocial support 5. Can the patient actively participate in an intensive therapy program of at least 3 hrs of therapy per day at least 5 days per week? Yes 6. The potential for patient to make measurable gains while on inpatient rehab is good 7. Anticipated functional outcomes upon discharge from inpatient rehab are supervision and min assist  with PT, supervision and min assist with OT, n/a with SLP. 8. Estimated rehab length of stay to reach the above functional goals is: 20-30 days 9. Anticipated D/C setting: Home 10. Anticipated post D/C treatments: HH therapy and Outpatient therapy 11. Overall Rehab/Functional Prognosis: excellent  RECOMMENDATIONS: This patient's condition is appropriate for continued rehabilitative care in the following setting: CIR Patient has agreed to participate in recommended program. Yes Note that insurance prior authorization may be required for reimbursement for recommended care.  Comment: Rehab Admissions Coordinator to follow up.  Thanks,  Ranelle Oyster, MD, [place] [facility]  I have personally performed a face to face diagnostic evaluation of this patient. Additionally, I have reviewed and concur with the physician assistant's documentation above.    Wayne Rossetti Angiulli, PA-C 03/01/2018        Revision History                         Routing History

## 2018-03-06 NOTE — Progress Notes (Signed)
PMR Admission Coordinator Pre-Admission Assessment  Patient: Wayne Buckley is an 67 y.o., male MRN: [id number] DOB: [birth year] Height: 5\' 11"  (180.3 cm) Weight: 99.8 kg                                                                                                                                                  Insurance Information HMO:     PPO:      PCP:      IPA:      80/20:      OTHER:  PRIMARY: Medicare A & B      Policy#: [id number]      Subscriber: Self Pre-Cert#: Eligible       Employer: Retired  Benefits:  Phone #: Verified online     Name: [employer]  Eff. Date: A:08/18/15  B:10/17/17     Deduct: $1364      Out of Pocket Max: N/A      Life Max: N/A CIR: 100%      SNF: 100% days 1-20; 80% 21-100 Outpatient: 80%     Co-Pay: 20% Home Health: 100%      Co-Pay: $0 DME: 80%     Co-Pay: 20% Providers: Patient's Choice   SECONDARY: [employer] of [place]      Policy#: [id number]      Subscriber: Spouse Employer: Full Time, but laid off May 2019 with compensation through May 2020 Benefits:  Phone #: [phone]  TERTIARY: Generic Commercial       Policy#: [id number]      Subscriber: Self Employer: Retired    Benefits:  Phone #: [phone]       Emergency Contact Information         Contact Information    Name Relation Home Work Mobile   Gastelum,Denise D  [phone]  [phone]     Current Medical History  Patient Admitting Diagnosis: Diagnosis:67 yo male with hx of TBI who sustained fall and C6-7 cord contusion which required ACDF  History of Present Illness: Ashad Fawbush. Marsiglia is a 67 year old right-handed male with history of alcohol use, hypertension, TBI/SDH 6 years ago craniotomy received inpatient rehab services November 2013. Per chart review he lives with his spouse. Reportedly independent had refused assistive devices. He was driving short distances. 2 level home with master bedroom and bathroom on main level. Presented 02/25/2018 after a fall x2 with noted  lower extremity weakness. Cranial CT scan showed no acute changes. Noted chronic encephalomalacia. Remote left frontal craniotomy as well as remote right occipital skull fracture. Noted vascular studies lower extremities positive for acute DVT left common femoral vein, femoral vein, profunda vein, popliteal vein peroneal vein posterior tibial veins gastrocnemius vein. Left external iliac vein also thrombosed. Underwent placement of IVC filter 02/27/2018 per Dr. Myra Gianotti. 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 Underwent ACDF and fusion 02/28/2018 per  Dr. Wynetta Emery. Hospital course pain management. Decadron protocol as indicated. Noted leukocytosis secondary to steroids. Intravenous heparin initiated for extensive left lower extremity DVT postoperatively 9/16/2019and plan to begin Eliquis 03/08/2018. Therapy evaluations completed with recommendations of physical medicine rehab consult. Patient was admitted for a comprehensive rehab program 03/05/18.  Past Medical History      Past Medical History:  Diagnosis Date  . Hypertension   . TBI (traumatic brain injury) (HCC) 2013    Family History  family history includes Heart disease in his father.  Prior Rehab/Hospitalizations:  Has the patient had major surgery during 100 days prior to admission? No  Current Medications   Current Facility-Administered Medications:  .  0.9 %  sodium chloride infusion, , Intravenous, Continuous, Lupita Leash, MD, Stopped at 03/05/18 0547 .  amLODipine (NORVASC) tablet 10 mg, 10 mg, Oral, Daily, Tyrone Nine, MD, 10 mg at 03/05/18 0902 .  benzonatate (TESSALON) capsule 200 mg, 200 mg, Oral, BID PRN, Blount, Xenia T, NP, 200 mg at 03/02/18 2141 .  carvedilol (COREG) tablet 12.5 mg, 12.5 mg, Oral, BID WC, Singh, Prashant K, MD .  cefTRIAXone (ROCEPHIN) 1 g in sodium chloride 0.9 % 100 mL IVPB, 1 g, Intravenous, Q24H, Leroy Sea, MD, Last Rate: 200 mL/hr at 03/05/18 0959, 1 g  at 03/05/18 0959 .  dexamethasone (DECADRON) injection 4 mg, 4 mg, Intravenous, Q12H, Leroy Sea, MD, 4 mg at 03/05/18 0904 .  feeding supplement (ENSURE ENLIVE) (ENSURE ENLIVE) liquid 237 mL, 237 mL, Oral, BID BM, Max Fickle B, MD, 237 mL at 03/05/18 0904 .  folic acid (FOLVITE) tablet 1 mg, 1 mg, Oral, Daily, McQuaid, Douglas B, MD, 1 mg at 03/05/18 0904 .  guaiFENesin-dextromethorphan (ROBITUSSIN DM) 100-10 MG/5ML syrup 5 mL, 5 mL, Oral, Q4H PRN, Hazeline Junker B, MD, 5 mL at 03/03/18 0905 .  heparin ADULT infusion 100 units/mL (25000 units/24mL sodium chloride 0.45%), 1,000 Units/hr, Intravenous, Continuous, Scarlett Presto, [facility], Last Rate: 10 mL/hr at 03/05/18 0546, 1,000 Units/hr at 03/05/18 0546 .  hydrALAZINE (APRESOLINE) tablet 100 mg, 100 mg, Oral, Q8H, Leroy Sea, MD, 100 mg at 03/05/18 0547 .  hydrochlorothiazide (HYDRODIURIL) tablet 25 mg, 25 mg, Oral, Daily, Max Fickle B, MD, 25 mg at 03/05/18 0904 .  labetalol (NORMODYNE,TRANDATE) injection 10 mg, 10 mg, Intravenous, Q4H PRN, Henry Russel, MD, 10 mg at 02/28/18 2311 .  lip balm (CARMEX) ointment 1 application, 1 application, Topical, PRN, Hazeline Junker B, MD .  LORazepam (ATIVAN) injection 1 mg, 1 mg, Intravenous, Once, McQuaid, Douglas B, MD .  multivitamin with minerals tablet 1 tablet, 1 tablet, Oral, Daily, Max Fickle B, MD, 1 tablet at 03/05/18 0903 .  phenol (CHLORASEPTIC) mouth spray 1 spray, 1 spray, Mouth/Throat, PRN, Hazeline Junker B, MD .  pneumococcal 23 valent vaccine (PNU-IMMUNE) injection 0.5 mL, 0.5 mL, Intramuscular, Tomorrow-1000, McQuaid, Douglas B, MD .  tamsulosin (FLOMAX) capsule 0.4 mg, 0.4 mg, Oral, Daily, Leroy Sea, MD, 0.4 mg at 03/05/18 0904 .  thiamine (VITAMIN B-1) tablet 250 mg, 250 mg, Oral, Daily, Max Fickle B, MD, 150 mg at 03/05/18 0939 .  vitamin C (ASCORBIC ACID) tablet 1,000 mg, 1,000 mg, Oral, BID, Max Fickle B, MD, 1,000 mg at 03/05/18 1610  Patients  Current Diet:     Diet Order                  DIET SOFT Room service appropriate? Yes; Fluid consistency: Thin  Diet effective now  Precautions / Restrictions Precautions Precautions: Fall, Cervical Precaution Comments: No brace Restrictions Weight Bearing Restrictions: No   Has the patient had 2 or more falls or a fall with injury in the past year?Yes, the fall that lead to this admission   Prior Activity Level Community (5-7x/wk): Prior to admission patient was fully independent and driving in the community.    Home Assistive Devices / Equipment Home Assistive Devices/Equipment: Cane (specify quad or straight), Walker (specify type) Home Equipment: Walker - 2 wheels, Cane - single point  Prior Device Use: Indicate devices/aids used by the patient prior to current illness, exacerbation or injury? None of the above  Prior Functional Level Prior Function Level of Independence: Independent Comments: refused to use any device for ambulation, pt was driving, retired Investment banker, operational, wife describes pt as "stubborn"  Self Care: Did the patient need help bathing, dressing, using the toilet or eating? Independent  Indoor Mobility: Did the patient need assistance with walking from room to room (with or without device)? Independent  Stairs: Did the patient need assistance with internal or external stairs (with or without device)? Independent  Functional Cognition: Did the patient need help planning regular tasks such as shopping or remembering to take medications? Independent  Current Functional Level Cognition  Overall Cognitive Status: Impaired/Different from baseline Current Attention Level: Focused Orientation Level: Oriented X4 Following Commands: Follows one step commands consistently Safety/Judgement: Decreased awareness of safety, Decreased awareness of deficits General Comments: pt in sitting leaning to right (sometimes needed VCs to correct and  other times he self-corrected    Extremity Assessment (includes Sensation/Coordination)  Upper Extremity Assessment: Defer to OT evaluation RUE Deficits / Details: ataxia RUE Coordination: decreased fine motor, decreased gross motor LUE Deficits / Details: ataxia LUE Coordination: decreased fine motor, decreased gross motor  Lower Extremity Assessment: RLE deficits/detail, LLE deficits/detail RLE Deficits / Details: AAROM WFL, strength grossly assessed hip 3+/5, knee flex 3+/5 knee ext 4/5, ankle 3+/5 RLE Coordination: decreased fine motor, decreased gross motor LLE Deficits / Details: increased edema from DVT, DVT with IV filter placement, increased edema limiting AAROM, grossly assessed strength hip 3+/5, knee 2+/5, ankle 2+/5 LLE Coordination: decreased fine motor, decreased gross motor    ADLs  Overall ADL's : Needs assistance/impaired Eating/Feeding: Supervision/ safety, Set up, Sitting Grooming: Moderate assistance, Sitting Upper Body Bathing: Maximal assistance, Sitting Lower Body Bathing: Total assistance, Bed level Upper Body Dressing : Maximal assistance, Sitting Lower Body Dressing: Total assistance, Bed level Functional mobility during ADLs: (requires lift)    Mobility  Overal bed mobility: Needs Assistance Bed Mobility: Rolling, Sit to Supine Rolling: Mod assist, +2 for safety/equipment Supine to sit: Max assist, +2 for physical assistance Sit to supine: Mod assist, +2 for physical assistance General bed mobility comments: Mod A to roll left and right x 2 each side, able to move RLE better than LLE to bend/cross to roll--once on his side difficult to maintain due to choreoathetosis; A for legs and trunk for up to sit EOB from right side lying    Transfers  Overall transfer level: Needs assistance Transfer via Lift Equipment: Max[place]y General transfer comment: Deferred.    Ambulation / Gait / Stairs / Engineer, drilling / Balance Dynamic  Sitting Balance Sitting balance - Comments: Pt sits EOB with Bil UE support and min A; performed reaching outside BoS with BUEs in all planes. More support needed when reaching with RUE then LUE. Favors leaning right and able to self  correct with cues. Choreoarthetosis impacting safe sitting balance. Sat EOB ~10 mins performing balance activities while wife worked on Nurse, mental health. Min-mod A needed.  Balance Overall balance assessment: Needs assistance Sitting-balance support: Bilateral upper extremity supported, Feet supported Sitting balance-[name] Scale: Poor Sitting balance - Comments: Pt sits EOB with Bil UE support and min A; performed reaching outside BoS with BUEs in all planes. More support needed when reaching with RUE then LUE. Favors leaning right and able to self correct with cues. Choreoarthetosis impacting safe sitting balance. Sat EOB ~10 mins performing balance activities while wife worked on Nurse, mental health. Min-mod A needed.  Postural control: Right lateral lean    Special needs/care consideration BiPAP/CPAP: No CPM: No Continuous Drip IV: Yes, Heparin   Dialysis: No         Life Vest: No Oxygen: No Special Bed: No Trach Size: No Wound Vac (area): No       Skin: Blister to left big toe, Monitor neck surgical incision, Moisture Associated Skin Damage to posterior bilateral buttocks                               Bowel mgmt: Incontinent, last BM 03/04/18 Bladder mgmt: Internal catheter placed 03/04/18 for acute urinary retention  Diabetic mgmt: No     Previous Home Environment Living Arrangements: Spouse/significant other Available Help at Discharge: Family, Available 24 hours/day Type of Home: House Home Layout: Two level, Able to live on main level with bedroom/bathroom Home Access: Stairs to enter Secretary/administrator of Steps: 4 Bathroom Shower/Tub: Health visitor: Standard Home Care Services: No  Discharge Living Setting Plans for Discharge  Living Setting: Patient's home, Lives with (comment)(Spouse and daughter ) Type of Home at Discharge: House Discharge Home Layout: Two level, Able to live on main level with bedroom/bathroom Alternate Level Stairs-Rails: Right Alternate Level Stairs-Number of Steps: 12 Discharge Home Access: Stairs to enter(back entrance ) Entrance Stairs-Rails: Can reach both Entrance Stairs-Number of Steps: 3 Discharge Bathroom Shower/Tub: Walk-in shower, Door Discharge Bathroom Toilet: Standard Discharge Bathroom Accessibility: No(Second bathroom on main potentially wheelchair accessible ) Does the patient have any problems obtaining your medications?: No  Social/Family/Support Systems Patient Roles: Spouse, Parent Contact Information: See above for spouse Anticipated Caregiver: Spouse Anticipated Caregiver's Contact Information: see above  Ability/Limitations of Caregiver: TBD amount of assist she can provide  Caregiver Availability: 24/7 Discharge Plan Discussed with Primary Caregiver: Yes Is Caregiver In Agreement with Plan?: Yes Does Caregiver/Family have Issues with Lodging/Transportation while Pt is in Rehab?: No  Goals/Additional Needs Patient/Family Goal for Rehab: PT/OT: Supervision-Min A; SLP: Mod I Expected length of stay: 20-30 days  Cultural Considerations: Catholic  Dietary Needs: Dys. 3 textures and thin liquids  Equipment Needs: TBD Special Service Needs: None Additional Information: Was previously admitted here with BI in 2013 Pt/Family Agrees to Admission and willing to participate: Yes Program Orientation Provided & Reviewed with Pt/Caregiver Including Roles  & Responsibilities: Yes Additional Information Needs: Spouse, requests that [name] review diagnosis and prognosis  Information Needs to be Provided By: [name] made aware of request for after 2:30pm today via text  Barriers to Discharge: Medical stability, Home environment access/layout  Decrease burden of  Care through IP rehab admission: No  Possible need for SNF placement upon discharge: Not anticipated   Patient Condition: This patient's medical and functional status has changed since the consult dated: 03/01/18 in which the Rehabilitation Physician determined and  documented that the patient's condition is appropriate for intensive rehabilitative care in an inpatient rehabilitation facility. See "History of Present Illness" (above) for medical update. Functional changes are: Mod-Max A bed mobility. Patient's medical and functional status update has been discussed with the Rehabilitation physician and patient remains appropriate for inpatient rehabilitation. Will admit to inpatient rehab today.  Preadmission Screen Completed By:  Fae PippinMelissa Larinda Herter, 03/05/2018 1:07 PM ______________________________________________________________________   Discussed status with Dr. Riley KillSwartz on 03/05/18 at 1330 and received telephone approval for admission today.  Admission Coordinator:  Fae PippinMelissa Moritz Lever, time 1330/Date 03/05/18           Cosigned by: Ranelle OysterSwartz, Zachary T, MD at 03/05/2018 1:33 PM  Revision History

## 2018-03-06 NOTE — Progress Notes (Signed)
Occupational Therapy Session Note  Patient Details  Name: Wayne Buckley MRN: [id number] Date of Birth: 08/20/1950  Today's Date: 03/06/2018 OT Individual Time: 1300-1330 OT Individual Time Calculation (min): 30 min    Short Term Goals: Week 1:  OT Short Term Goal 1 (Week 1): Pt will perform toilet transfer with max A. OT Short Term Goal 2 (Week 1): Pt will perform UB dressing with mod A.  OT Short Term Goal 3 (Week 1): Pt will engaged in 15 minutes of functional task with 1 rest break or less.   Skilled Therapeutic Interventions/Progress Updates:    1:1 Focus on dressing and transfers. Pt required mod A to come to EOB with HOB raised. Pt demonstrated significant truncal ataxia throughout session challenging his sitting balance required min to mod A. Pt required max A to don shirt. Pt unable to lift his LEs against gravity to assist with threading his pants. Pt attempted to stand from elevated bed with STEDY to pull up on. Pt unable to unweight his bottom to clear his bottom from the bed; but it did allow him to safely weight shift forward for OT to pull up his pants. Performed slide board transfer with max A into the w/c with max to total A to maintain forward weight shift and maintain proper head/ hip relationship with total cues . Hand off to the next therapist.    Therapy Documentation Precautions:  Precautions Precautions: Fall, Cervical Restrictions Weight Bearing Restrictions: No Pain: No c/o pain in session  See Function Navigator for Current Functional Status.   Therapy/Group: Individual Therapy  Roney MansSmith, Idella Lamontagne [place] 03/06/2018, 6:35 PM

## 2018-03-06 NOTE — Progress Notes (Signed)
ANTICOAGULATION CONSULT NOTE  Pharmacy Consult for Heparin Indication: DVT  No Known Allergies  Patient Measurements: Height: 5\' 11"  (180.3 cm) IBW/kg (Calculated) : 75.3 Heparin Dosing Weight: 95.8 kg  Vital Signs: Temp: 98.7 F (37.1 C) (09/18 0551) Temp Source: Oral (09/18 0551) BP: 149/98 (09/18 0551) Pulse Rate: 72 (09/18 0551)  Labs: Recent Labs    03/04/18 0407 03/04/18 0619 03/05/18 0736 03/05/18 1151 03/05/18 1947 03/06/18 0413  HGB  --  13.5 13.1  --   --  13.4  HCT  --  40.8 39.5  --   --  40.5  PLT  --  502* 494*  --   --  526*  HEPARINUNFRC  --   --   --  <0.10* 0.19* 0.34  CREATININE 0.66  --  0.69  --   --  0.63    Estimated Creatinine Clearance: 106.4 mL/min (by C-G formula based on SCr of 0.63 mg/dL).   Medical History: Past Medical History:  Diagnosis Date  . Hypertension   . TBI (traumatic brain injury) [facility](HCC) 2013   Assessment: 67 yr old male to begin IV heparin in am on 9/17 for extensive left leg DVT. Noted fall PTA resulting in C6-7 disc herniation causing severe spinal stenosis and cord compression. S/p IVC filter and ACDF surgery on 02/27/18.  Dr. Thedore MinsSingh discussed with Dr. Wynetta Emeryram and conservative anticoagulation started 9/17. No heparin boluses to be given. Also noted with hx SDH in 2013 due to fall.  Heparin level therapeutic this am. Goal of Therapy:  Heparin level 0.3-0.5 units/ml (lower end of therapeutic range) Monitor platelets by anticoagulation protocol: Yes   Plan:  Continue heparin at 1600 units/hr Check heparin level in 6 hours to confirm Monitor daily heparin level and CBC, s/sx bleeding  Thanks for allowing pharmacy to be a part of this patient's care.  Talbert CageLora Hanna Aultman, PharmD Clinical Pharmacist

## 2018-03-07 ENCOUNTER — Ambulatory Visit (HOSPITAL_COMMUNITY): Payer: Medicare Other | Admitting: Speech Pathology

## 2018-03-07 ENCOUNTER — Inpatient Hospital Stay (HOSPITAL_COMMUNITY): Payer: Medicare Other | Admitting: Occupational Therapy

## 2018-03-07 ENCOUNTER — Inpatient Hospital Stay (HOSPITAL_COMMUNITY): Payer: Medicare Other | Admitting: Speech Pathology

## 2018-03-07 ENCOUNTER — Inpatient Hospital Stay (HOSPITAL_COMMUNITY): Payer: Medicare Other | Admitting: Physical Therapy

## 2018-03-07 LAB — CBC
HCT: 41.9 % (ref 39.0–52.0)
HEMOGLOBIN: 13.7 g/dL (ref 13.0–17.0)
MCH: 29.4 pg (ref 26.0–34.0)
MCHC: 32.7 g/dL (ref 30.0–36.0)
MCV: 89.9 fL (ref 78.0–100.0)
Platelets: 541 10*3/uL — ABNORMAL HIGH (ref 150–400)
RBC: 4.66 MIL/uL (ref 4.22–5.81)
RDW: 13.2 % (ref 11.5–15.5)
WBC: 24.1 10*3/uL — ABNORMAL HIGH (ref 4.0–10.5)

## 2018-03-07 LAB — HEPARIN LEVEL (UNFRACTIONATED): Heparin Unfractionated: 0.45 IU/mL (ref 0.30–0.70)

## 2018-03-07 MED ORDER — BISACODYL 10 MG RE SUPP
10.0000 mg | Freq: Every day | RECTAL | Status: DC
  Administered 2018-03-07: 10 mg via RECTAL
  Filled 2018-03-07 (×2): qty 1

## 2018-03-07 NOTE — Progress Notes (Signed)
Occupational Therapy Session Note  Patient Details  Name: Wayne Buckley M Seat MRN: [id number] Date of Birth: 11/11/1950  Today's Date: 03/07/2018 OT Individual Time: [id number] and 1103 - 1130 OT Individual Time Calculation (min): 15 min  and 27 min  Today's Date: 03/07/2018 OT Missed Time: 45 Minutes Missed Time Reason: Patient unwilling/refused to participate without medical reason   Short Term Goals: Week 1:  OT Short Term Goal 1 (Week 1): Pt will perform toilet transfer with max A. OT Short Term Goal 2 (Week 1): Pt will perform UB dressing with mod A.  OT Short Term Goal 3 (Week 1): Pt will engaged in 15 minutes of functional task with 1 rest break or less.   Skilled Therapeutic Interventions/Progress Updates:   Session 1: Upon entering the room, pt supine in bed with no c/o pain. Pt refusing OT intervention and reports, " I need time for my food to digest". OT educating pt on importance of participation, reviewed schedule and requirements for inpatient rehab, and OT goals. Pt continued to refuse all aspects of therapeutic intervention this session. Pt remained in bed with call bell and all needed items within reach.   Session 2: RN and NT present and finishing I and O cath. Pt was also incontinent of BM and placed on bed pan with max A for rolling. Pt rolling L <> R with use of bed rails and max A for hygiene and LB clothing management with total A. Pt remained in bed at end of session to transition into SLP treatment. Bed alarm activated. Call bell and all needed items within reach.    Therapy Documentation Precautions:  Precautions Precautions: Fall, Cervical Restrictions Weight Bearing Restrictions: No General: General OT Amount of Missed Time: 45 Minutes  See Function Navigator for Current Functional Status.   Therapy/Group: Individual Therapy  Alen BleacherBradsher, Wasim Hurlbut P 03/07/2018, 9:28 AM

## 2018-03-07 NOTE — Progress Notes (Signed)
ANTICOAGULATION CONSULT NOTE  Pharmacy Consult for Heparin Indication: DVT  No Known Allergies  Patient Measurements: Height: 5\' 11"  (180.3 cm) IBW/kg (Calculated) : 75.3 Heparin Dosing Weight: 95.8 kg  Vital Signs: Temp: 98.6 F (37 C) (09/19 0321) BP: 116/88 (09/19 0321) Pulse Rate: 79 (09/19 0321)  Labs: Recent Labs    03/05/18 0736  03/06/18 0413 03/06/18 1154 03/07/18 0550  HGB 13.1  --  13.4  --   --   HCT 39.5  --  40.5  --   --   PLT 494*  --  526*  --   --   HEPARINUNFRC  --    < > 0.34 0.50 0.45  CREATININE 0.69  --  0.63  --   --    < > = values in this interval not displayed.    Estimated Creatinine Clearance: 106.4 mL/min (by C-G formula based on SCr of 0.63 mg/dL).   Medical History: Past Medical History:  Diagnosis Date  . Hypertension   . TBI (traumatic brain injury) [facility](HCC) 2013   Assessment: 67 yr old male to begin IV heparin in am on 9/17 for extensive LLE DVT now s/p IVC filter 9/11 and s/p cervical ACDF surgery on 9/11. Neurosurg ok'd starting IV heparin on 9/17 - conservatively with no boluses. Last heparin level is 0.45. Hgb and plts ok. Plan to transition to apixaban on 9/20.   Goal of Therapy:  Heparin level 0.3-0.5 units/ml (lower end of therapeutic range) Monitor platelets by anticoagulation protocol: Yes   Plan:  Continue heparin gtt at 1,600 units/hr Monitor daily heparin level, CBC, s/s of bleed No boluses  Thanks for allowing pharmacy to be a part of this patient's care.  Enzo BiNathan Symeon Puleo, PharmD, BCPS Clinical Pharmacist Phone number [phone] 03/07/2018 7:45 AM

## 2018-03-07 NOTE — Progress Notes (Signed)
[place] PHYSICAL MEDICINE & REHABILITATION     PROGRESS NOTE    Subjective/Complaints: Pt slept well again. Felt that therapy went well.   ROS: Patient denies fever, rash, sore throat, blurred vision, nausea, vomiting, diarrhea, cough, shortness of breath or chest pain, joint or back pain, headache, or mood change.    Objective:   No results found. Recent Labs    03/06/18 0413 03/07/18 0741  WBC 25.7* 24.1*  HGB 13.4 13.7  HCT 40.5 41.9  PLT 526* 541*   Recent Labs    03/05/18 0736 03/06/18 0413  NA 134* 135  K 4.3 4.2  CL 96* 98  GLUCOSE 126* 123*  BUN 27* 23  CREATININE 0.69 0.63  CALCIUM 8.6* 8.5*   CBG (last 3)  No results for input(s): GLUCAP in the last 72 hours.  Wt Readings from Last 3 Encounters:  03/02/18 99.8 kg  08/07/12 88.2 kg  06/24/12 84.6 kg     Intake/Output Summary (Last 24 hours) at 03/07/2018 0803 Last data filed at 03/07/2018 0300 Gross per 24 hour  Intake 393.91 ml  Output 3400 ml  Net -3006.09 ml    Vital Signs: Blood pressure 116/88, pulse 79, temperature 98.6 F (37 C), resp. rate 17, height 5\' 11"  (1.803 m), SpO2 100 %. Physical Exam:  Constitutional: No distress . Vital signs reviewed. HEENT: EOMI, oral membranes moist Neck: supple Cardiovascular: RRR without murmur. No JVD    Respiratory: CTA Bilaterally without wheezes or rales. Normal effort    GI: BS +, non-tender, non-distended  Left lower extremity with 2+ edema with sl decrease. Non erythematous Neurological: He isalertand oriented to person, place, and time.speech dysarthric Biceps and deltoids are 5 out of 5. triceps and HI 3/5. Right lower extremity grossly 4 out of 5 proximal distal. Left lower extremity is able to move at all levels but is limited due to pain and swelling. Patient is very distractible and appears impulsive. Sensory function seems to be decreased to light touch below the level of his injury. Sl tone LLE Skin: Skin iswarm.   Psychiatric: pleasant.    Assessment/Plan: 1. 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 Physiatrist is providing close team supervision and 24 hour management of active medical problems listed below. Physiatrist and rehab team continue to assess barriers to discharge/monitor patient progress toward functional and medical goals.  Function:  Bathing Bathing position Bathing activity did not occur: Refused    Bathing parts      Bathing assist        Upper Body Dressing/Undressing Upper body dressing Upper body dressing/undressing activity did not occur: Refused What is the patient wearing?: Pull over shirt/dress       Pull over shirt/dress - Perfomed by helper: Thread/unthread right sleeve, Put head through opening, Thread/unthread left sleeve, Pull shirt over trunk        Upper body assist        Lower Body Dressing/Undressing Lower body dressing Lower body dressing/undressing activity did not occur: Refused What is the patient wearing?: Pants, Non-skid slipper socks, Ted Hose       Pants- Performed by helper: Thread/unthread right pants leg, Thread/unthread left pants leg, Pull pants up/down   Non-skid slipper socks- Performed by helper: Don/doff right sock, Don/doff left sock               TED Hose - Performed by helper: Don/doff right TED hose,  Don/doff left TED hose  Lower body assist Assist for lower body dressing: Touching or steadying assistance (Pt > 75%)      Toileting Toileting     Toileting steps completed by helper: Adjust clothing prior to toileting, Performs perineal hygiene, Adjust clothing after toileting    Toileting assist Assist level: Two helpers   Transfers Chair/bed transfer   Chair/bed transfer method: Squat pivot Chair/bed transfer assist level: 2 helpers Chair/bed transfer assistive device: Mechanical  lift Mechanical lift: Landscape architect Ambulation activity did not occur: Safety/medical Investment banker, operational activity did not occur: Safety/medical concerns        Cognition Comprehension Comprehension assist level: Understands basic 75 - 89% of the time/ requires cueing 10 - 24% of the time  Expression Expression assist level: Expresses basic 50 - 74% of the time/requires cueing 25 - 49% of the time. Needs to repeat parts of sentences.  Social Interaction Social Interaction assist level: Interacts appropriately 75 - 89% of the time - Needs redirection for appropriate language or to initiate interaction.  Problem Solving Problem solving assist level: Solves basic 90% of the time/requires cueing < 10% of the time  Memory Memory assist level: Recognizes or recalls 75 - 89% of the time/requires cueing 10 - 24% of the time   Medical Problem List and Plan: 1.Decreased functional mobilitysecondary to C6-7 cord compression.S/PACDF C6-7 02/28/2018 as well as history of TBI/SDH 2013 -beginning therapies 2. DVT Prophylaxis/Anticoagulation: Extensive left lower extremity DVT status post IVC filter 02/27/2018. IV heparin initiated 03/04/2018 transition Eliquisbeginning9/20/2019 -Local edema control left lower extremity, elevation 3. Pain Management:Tylenol as needed 4. Mood:Provide emotional support 5. Neuropsych: This patientiscapable of making decisions on hisown behalf. 6. Skin/Wound Care:Routine skin checks 7. Fluids/Electrolytes/Nutrition:  -encourage PO  8.Leukocytosis. Steroid-induced. Urine study negative  -afebrile, exam benign 9.Hypertension. HCTZ 25 mg daily, Coreg 12.5mg  twice daily, Norvasc 10 mg daily,hydralazine 100 mg every 8 hours. 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 Provide counseling  LOS (Days) 2 A FACE TO FACE EVALUATION WAS PERFORMED  Ranelle Oyster, MD 03/07/2018 8:03 AM

## 2018-03-07 NOTE — Progress Notes (Signed)
Speech Language Pathology Daily Session Note  Patient Details  Name: Wayne Buckley MRN: [id number] Date of Birth: 07/21/1950  Today's Date: 03/07/2018   Skilled treatment session #1 SLP Individual Time: 1130-1200 SLP Individual Time Calculation (min): 30 min  Short Term Goals: Week 1: SLP Short Term Goal 1 (Week 1): Pt will consume dysphagia 3 diet with thin liquids and Min A for compensatory swallow strategies.  SLP Short Term Goal 2 (Week 1): Pt will utilize compensatory memory strategies to recall dialy activities with Min A cues.  SLP Short Term Goal 3 (Week 1): Pt will demonstrate intellectual awareness by listing 2 physical and 2 cognitive-linguistic/dysphagia deficits related to recent hospitalization with Mod A cues.  SLP Short Term Goal 4 (Week 1): Pt will follow 2 step directions with Min A cues in 9 of 10 opportunities.  SLP Short Term Goal 5 (Week 1): Pt will complete semi-complex problem solving tasks with Mod A cues.  SLP Short Term Goal 6 (Week 1): Pt will implement speech intelligibility strategies at the phrase level to increase speech intelligibility to ~ 75% with Mod A cues.   Skilled Therapeutic Interventions:  Skilled treatment session #1 focused on dysphagia and cognition goals. SLP facilitated session by providing skilled observation of pt consuming dysphagia 3 lunch tray with thin liquids. Pt with ataxic arm movements which interfere with presentation of food to his mouth - take with impulsivity and decreased overall awareness lead to larger bite sizes. Pt with ataxic oral movements but good (improved oral clearing) with dysphagia 3 lunch tray. No overt s/s of aspiration. Pt is inconsistent with report of baseline ability so this writer performed more detailed chart review.      Per note from neurologist (Dr Caryl PinaEric Lindzen) on 02/26/18 pt with longstanding abnormal movements of his face, upper and lower extremities that she (wife) describes as looking like an extreme case of  fidgeting. She further clarifies that the abnormal fidgeting consists of frequent writhing movements of his upper extremities, lower extremities and face, which would stop during sleep. He was not evaluated for this.  These movements started after he had a fall 6 years ago resulting in brain trauma and cerebral bleed that required surgery to "remove a large part of the left side of his brain". No further falls since then until the fall on Tuesday (02/19/18). He notes Cerebellar: Prominent ataxia with bilateral FNF. - per chart review - only acute deficits noted by wife was bilateral leg weakness which made the pt bedbound.   Additionally, pt with baseline mentation until day of 02/28/18 following anesthesia for ACDF. Nursing notes revealed significant increase in confusion while in PACU with consult made to Critical Care MD as pt was confused about day vs. night etc d/t encephalopathy. Critical Care notes that Altered mental status may be multifactorial d/t multiple sedative medications from anesthesia as well as H/o ETOH abuse: ?associated encephalopathy and started on Thiamine with D51/2 NS.   Current goals appear appropriate as pt's cognitive abilities appear to have fluctuated from baseline.   Skilled treatment session #2 focused on cognition goals. SLP facilitated session by providing Max A for intellectual awareness of deficits related to lower body weakness. Pt perseverates on topic that his "getting better." Despite encouragement that pt is getting better, he is not able to demonstrate awareness of deficits or general need of rehab. He states that he needs his rest so he required encouragement to participate in ST this afternoon. Pt is oriented and able to  demonstrate selective attention. His speech intelligibility continues to be ataxic but was much more intelligible this session (sentence level ~ 75%) but he is not able to effectively implement speech intelligibility strategies (question if this is  baseline speech pattern d/t inability to alter speech with strategies or could be decreased cognitive understanding of strategies). Pt is able to communicate wants and needs effectively, demonstrate use of call light. Of note, he also consumed 8 oz thin liquids via straw with no overt s/s of aspiration during session. Pt was left upright in bed, shades open, lights on and all needs within reach. Continue per current plan of care.          Function:  Eating Eating   Modified Consistency Diet: Yes Eating Assist Level: Supervision or verbal cues;Set up assist for           Cognition Comprehension Comprehension assist level: Understands basic 75 - 89% of the time/ requires cueing 10 - 24% of the time  Expression   Expression assist level: Expresses basic 50 - 74% of the time/requires cueing 25 - 49% of the time. Needs to repeat parts of sentences.;Expresses basic 75 - 89% of the time/requires cueing 10 - 24% of the time. Needs helper to occlude trach/needs to repeat words.  Social Interaction Social Interaction assist level: Interacts appropriately 75 - 89% of the time - Needs redirection for appropriate language or to initiate interaction.  Problem Solving Problem solving assist level: Solves basic 90% of the time/requires cueing < 10% of the time  Memory Memory assist level: Recognizes or recalls 75 - 89% of the time/requires cueing 10 - 24% of the time    Pain    Therapy/Group: Individual Therapy  Shaylah Mcghie 03/07/2018, 12:38 PM

## 2018-03-07 NOTE — Progress Notes (Signed)
Physical Therapy Session Note  Patient Details  Name: Wayne Buckley MRN: [id number] Date of Birth: 02/03/1951  Today's Date: 03/07/2018 PT Individual Time: 5826-0888 PT Individual Time Calculation (min): 25 min   Short Term Goals: Week 1:  PT Short Term Goal 1 (Week 1): Pt will demonstrate bed mobility with mod assist using bed features. PT Short Term Goal 2 (Week 1): Pt will transition sit<>stand with +2 assist and without power lift equipment.  PT Short Term Goal 3 (Week 1): Pt will propel w/c 25' with min assist.   Skilled Therapeutic Interventions/Progress Updates:    no c/o pain.  Session focus on functional mobility and motor planning for transfers and w/c mobility.    Pt transitions to EOB with significantly increased time and overall mod assist to slide LEs to EOB.  Pt uses HOB maximally elevated and bed rails to come to sitting with max multimodal cues to use bed features.  Lateral leans to place slide board with total assist for lean and second person to place slide board.  +2 total assist for slide board transfer to w/c on pt's L, with max assist to maintain forward weight shift and head/hips relationship and total assist for lateral scoot to L.  W/c propulsion for mobility, UE strengthening/coordination, and overall activity tolerance x60' +50' +50' with rest breaks in between.  Pt requires encouragement to not be self limiting.  Returned to room total assist and positioned upright in w/c with chair alarm intact, call bell in reach and needs met.   Therapy Documentation Precautions:  Precautions Precautions: Fall, Cervical Restrictions Weight Bearing Restrictions: No   See Function Navigator for Current Functional Status.   Therapy/Group: Individual Therapy  [name] 03/07/2018, 4:41 PM

## 2018-03-07 NOTE — Progress Notes (Signed)
Social Work  Social Work Assessment and Plan  Patient Details  Name: Wayne Buckley MRN: [id number] Date of Birth: 11/03/1950  Today's Date: 03/07/2018  Problem List:  Patient Active Problem List   Diagnosis Date Noted  . Cord compression (HCC) 03/05/2018  . C5-C7 level spinal cord injury (HCC)   . Central cord syndrome (HCC)   . Neurogenic bladder   . Essential hypertension   . AKI (acute kidney injury) (HCC) 02/26/2018  . Bilateral leg weakness 02/26/2018  . Leg DVT (deep venous thromboembolism), acute, left (HCC) 02/26/2018  . Fall 02/25/2018  . Alcohol abuse 05/04/2012  . Hypokalemia 05/04/2012  . Urinary retention 05/04/2012  . Subdural hematoma, post-traumatic (HCC) 04/28/2012   Past Medical History:  Past Medical History:  Diagnosis Date  . Hypertension   . TBI (traumatic brain injury) (HCC) 2013   Past Surgical History:  Past Surgical History:  Procedure Laterality Date  . ANTERIOR CERVICAL DECOMP/DISCECTOMY FUSION N/A 02/27/2018   Procedure: ANTERIOR CERVICAL DECOMPRESSION/DISCECTOMY FUSION C6-7;  Surgeon: Donalee Citrinram, Gary, MD;  [facility];  Service: Neurosurgery;  Laterality: N/A;  . CRANIOTOMY  04/28/2012   Procedure: CRANIOTOMY HEMATOMA EVACUATION SUBDURAL;  Surgeon: Reinaldo Meekerandy O Kritzer, MD;  Location: [facility] OR;  Service: Neurosurgery;  Laterality: Left;  Left Craniotomy for Subdural Hematoma  . RADIOLOGY WITH ANESTHESIA N/A 02/27/2018   Procedure: MRI WITH ANESTHESIA;  Surgeon: Radiologist, Medication, MD;  Location: [facility] OR;  Service: Radiology;  Laterality: N/A;  . VENA CAVA FILTER PLACEMENT Right 02/27/2018   Procedure: INSERTION VENA-CAVA FILTER;  Surgeon: Nada LibmanBrabham, Vance W, MD;  Location: [facility] OR;  Service: Vascular;  Laterality: Right;   Social History:  reports that he has never smoked. He has never used smokeless tobacco. He reports that he drinks alcohol. His drug history is not on file.  Family / Support Systems Marital Status: Married How Long?: 26 yrs Patient  Roles: Spouse, Parent Spouse/Significant Other: Wife, Arvella NighDenise Repka @ (C) [phone] Children: they have one daughter, Tonna CornerLily, who is 2519 yrs and a Consulting civil engineerstudent at [employer] Anticipated Caregiver: Spouse Ability/Limitations of Caregiver: TBD amount of assist she can provide - wife is currently unemployed (was laid off) but hopes to secure employment as she had not planned to stop working yet.   Caregiver Availability: 24/7 Family Dynamics: Wife and daughter very supportive and have been caregivers/ supports consistently since his TBI in 2013.   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  Able to complete assessment interview without any difficulty.  Denies any significant emotional distress and is optimistic about recovery from this injury.  Feel pt may benefit from neuropsychology involvement especially if functional return does not take place as he expects.  Wife expressing concern about recovery and his expectations. Recent Psychosocial Issues: Had been pretty independent PTA.  Notable TBI 2013. Pyschiatric History: none Substance Abuse History: notable for ETOH abuse hx prior to TBI  Patient / Family Perceptions, Expectations & Goals Pt/Family understanding of illness & functional limitations: Pt and wife with general understanding of the injuries  suffered in fall, surgery performed and  current functional limitations/ need for CIR.  Both hopeful at this point that he will regain some level of independence. Premorbid pt/family roles/activities: PT was independent and even driving some.  Did not require any assistance.   Anticipated changes in roles/activities/participation: Per mod assist goals, wife will need to provide primary caregiver support needed. Pt/family expectations/goals: Both hopeful he may regain some mobility/ ambulation.  Community [employer] Agencies: None Premorbid Home Care/DME Agencies: Other (Comment)(attended [facility] following TBI) Transportation available at discharge: yes Resource referrals recommended: Neuropsychology, Support group (specify)  Discharge Planning Living Arrangements: Spouse/significant other Support Systems: Spouse/significant other, Children, Friends/neighbors Type of Residence: Private residence Insurance Resources: [employer], Media planner (specify)(BCBS) Financial Resources: Restaurant manager, fast food Screen Referred: No Living Expenses: Own Money Management: Spouse Does the patient have any problems obtaining your medications?: No Home Management: wife is primary home management person Patient/Family Preliminary Plans: Pt to return home with wife as primary support and able to cover 24/7  Social Work Anticipated Follow Up Needs: HH/OP, Support Group Expected length of stay: 25-28 days  Clinical Impression Pleasant gentleman here following a fall at home and suffering cord compression and s/p ACDF.  Familiar to this SW from CIR stay in 2013 following a fall and TBI.  Obvious speech and cognitive deficits likely old and now with significant, new physical deficits.  Pt and wife concerned but hopeful he will be able to regain some level of mobility/ ambulation if possible.  Wife is very supportive and not currently working.  Will follow for support and d/c planning needs.  Kabrea Seeney 03/07/2018, 4:12 PM

## 2018-03-08 ENCOUNTER — Inpatient Hospital Stay (HOSPITAL_COMMUNITY): Payer: Medicare Other | Admitting: Physical Therapy

## 2018-03-08 ENCOUNTER — Inpatient Hospital Stay (HOSPITAL_COMMUNITY): Payer: Medicare Other

## 2018-03-08 ENCOUNTER — Inpatient Hospital Stay (HOSPITAL_COMMUNITY): Payer: Medicare Other | Admitting: Speech Pathology

## 2018-03-08 ENCOUNTER — Inpatient Hospital Stay (HOSPITAL_COMMUNITY): Payer: Medicare Other | Admitting: Occupational Therapy

## 2018-03-08 LAB — CBC
HCT: 40.3 % (ref 39.0–52.0)
HEMOGLOBIN: 13.6 g/dL (ref 13.0–17.0)
MCH: 29.8 pg (ref 26.0–34.0)
MCHC: 33.7 g/dL (ref 30.0–36.0)
MCV: 88.4 fL (ref 78.0–100.0)
PLATELETS: 553 10*3/uL — AB (ref 150–400)
RBC: 4.56 MIL/uL (ref 4.22–5.81)
RDW: 13.1 % (ref 11.5–15.5)
WBC: 24 10*3/uL — AB (ref 4.0–10.5)

## 2018-03-08 MED ORDER — APIXABAN 5 MG PO TABS
5.0000 mg | ORAL_TABLET | Freq: Two times a day (BID) | ORAL | Status: DC
  Administered 2018-03-15 – 2018-04-04 (×41): 5 mg via ORAL
  Filled 2018-03-08 (×38): qty 1
  Filled 2018-03-08: qty 2
  Filled 2018-03-08 (×2): qty 1

## 2018-03-08 MED ORDER — BISACODYL 10 MG RE SUPP
10.0000 mg | Freq: Every day | RECTAL | Status: DC
  Administered 2018-03-09 – 2018-03-27 (×11): 10 mg via RECTAL
  Filled 2018-03-08 (×23): qty 1

## 2018-03-08 MED ORDER — APIXABAN 5 MG PO TABS
10.0000 mg | ORAL_TABLET | Freq: Two times a day (BID) | ORAL | Status: AC
  Administered 2018-03-08 – 2018-03-14 (×14): 10 mg via ORAL
  Filled 2018-03-08 (×13): qty 2

## 2018-03-08 MED ORDER — DEXAMETHASONE 2 MG PO TABS
1.0000 mg | ORAL_TABLET | Freq: Two times a day (BID) | ORAL | Status: DC
  Administered 2018-03-08 – 2018-03-09 (×3): 1 mg via ORAL
  Administered 2018-03-10: 2 mg via ORAL
  Administered 2018-03-10 – 2018-03-14 (×8): 1 mg via ORAL
  Filled 2018-03-08 (×12): qty 1

## 2018-03-08 NOTE — Plan of Care (Signed)
  Problem: SCI BOWEL ELIMINATION Goal: RH STG MANAGE BOWEL WITH ASSISTANCE Description STG Manage Bowel with min Assistance.  Outcome: Progressing  Bowel regimen  Problem: SCI BLADDER ELIMINATION Goal: RH STG MANAGE BLADDER WITH ASSISTANCE Description STG Manage Bladder With total Assistance  Outcome: Progressing   Problem: RH SAFETY Goal: RH STG ADHERE TO SAFETY PRECAUTIONS W/ASSISTANCE/DEVICE Description STG Adhere to Safety Precautions With min Assistance/Device.  Outcome: Progressing  Call light within hand, bed alarm, proper footwear.

## 2018-03-08 NOTE — IPOC Note (Signed)
Overall Plan of Care [facility]) Patient Details Name: Wayne Buckley MRN: [id number] DOB: 1950-07-07  Admitting Diagnosis: <principal problem not specified>  Hospital Problems: Active Problems:   Cord compression (HCC)   C5-C7 level spinal cord injury (HCC)   Central cord syndrome (HCC)   Neurogenic bladder   Essential hypertension     Functional Problem List: Nursing Bladder, Bowel, Edema, Endurance, Medication Management, Motor, Pain, Safety, Skin Integrity  PT Balance, Behavior, Endurance, Motor, Perception, Safety  OT Balance, Behavior, Cognition, Endurance, Perception, Safety  SLP Cognition, Safety, Perception, Motor  TR         Basic ADL's: OT Grooming, Bathing, Dressing, Toileting     Advanced  ADL's: OT       Transfers: PT Bed Mobility, Bed to Chair, Car, Occupational psychologist, Research scientist (life sciences): PT Stairs, Psychologist, prison and probation services, Ambulation     Additional Impairments: OT None  SLP Swallowing, Communication, Social Cognition expression Problem Solving, Memory, Attention, Awareness  TR      Anticipated Outcomes Item Anticipated Outcome  Self Feeding S  Swallowing  Mod I with least restrictive diet   Basic self-care  S/set up - min A  Toileting  min A   Bathroom Transfers min A  Bowel/Bladder  managed min assist   Transfers  min assist with LRAD  Locomotion  w/c level supervision  Communication  Mod I with simple conversation  Cognition  Min A to supervision  Pain  less than 2  Safety/Judgment  min assist    Therapy Plan: PT Intensity: Minimum of 1-2 x/day ,45 to 90 minutes PT Frequency: 5 out of 7 days, Total of 15 hours over 7 days of combined therapies PT Duration Estimated Length of Stay: 28-32 days OT Intensity: Minimum of 1-2 x/day, 45 to 90 minutes OT Frequency: 5 out of 7 days OT Duration/Estimated Length of Stay: 25-28 days SLP Intensity: Minumum of 1-2 x/day, 30 to 90 minutes SLP Frequency: 3 to 5 out of 7 days SLP  Duration/Estimated Length of Stay: 28 to 32 days    Team Interventions: Nursing Interventions Patient/Family Education, Bladder Management, Bowel Management, Disease Management/Prevention, Pain Management, Medication Management, Skin Care/Wound Management, Cognitive Remediation/Compensation, Discharge Planning  PT interventions Ambulation/gait training, DME/adaptive equipment instruction, Neuromuscular re-education, Psychosocial support, Stair training, UE/LE Strength taining/ROM, Wheelchair propulsion/positioning, UE/LE Coordination activities, Therapeutic Activities, Functional electrical stimulation, Discharge planning, Balance/vestibular training, Cognitive remediation/compensation, Functional mobility training, Patient/family education, Splinting/orthotics, Therapeutic Exercise, Visual/perceptual remediation/compensation  OT Interventions Balance/vestibular training, Neuromuscular re-education, Self Care/advanced ADL retraining, Therapeutic Exercise, Wheelchair propulsion/positioning, Cognitive remediation/compensation, DME/adaptive equipment instruction, Pain management, UE/LE Strength taining/ROM, Patient/family education, UE/LE Coordination activities, Discharge planning, Functional mobility training, Psychosocial support, Therapeutic Activities  SLP Interventions Cognitive remediation/compensation, Dysphagia/aspiration precaution training, Environmental controls, Internal/external aids, Functional tasks, Patient/family education, Cueing hierarchy  TR Interventions    SW/CM Interventions Psychosocial Support, Patient/Family Education, Discharge Planning   Barriers to Discharge MD  Medical stability  Nursing      PT Inaccessible home environment, Behavior    OT Other (comments) none known at this time  SLP      SW       Team Discharge Planning: Destination: PT-Home ,OT- Home , SLP-Home Projected Follow-up: PT-Home health PT, 24 hour supervision/assistance, OT-  24 hour  supervision/assistance, Home health OT, SLP-24 hour supervision/assistance, Outpatient SLP Projected Equipment Needs: PT-To be determined, OT- To be determined, SLP-None recommended by SLP Equipment Details: PT- , OT-  Patient/family involved in discharge planning: PT- Patient,  OT-Patient,  SLP-Patient  MD ELOS: 28-30 days Medical Rehab Prognosis:  Excellent Assessment: The patient has been admitted for CIR therapies with the diagnosis of 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 The team will be addressing functional mobility, strength, stamina, balance, safety, adaptive techniques and equipment, self-care, bowel and bladder mgt, patient and caregiver education, NMR, orthotics, pain control, SCI education, ego support, community reentry. Goals have been set at supervision to min assist with mobility, self-care, cognition.    Ranelle OysterZachary T. Elleigh Cassetta, MD, FAAPMR      See Team Conference Notes for weekly updates to the plan of care

## 2018-03-08 NOTE — Progress Notes (Signed)
Pt did not wish to take his bisacodyl suppository this morning. " I really don't want that now I just want to sleep." Pt was educated to the benefits of taking the suppository in the early morning. Pt asked if he could do it later, it was agreed that he would take it after dinner.

## 2018-03-08 NOTE — Plan of Care (Signed)
Remains alert and active with staff. Denies any pain. Tolerated all medications. Paste ordered for skin protection due to recent diarrhea.

## 2018-03-08 NOTE — Progress Notes (Signed)
Speech Language Pathology Daily Session Note  Patient Details  Name: Wayne Buckley MRN: [id number] Date of Birth: 02/05/1951  Today's Date: 03/08/2018 SLP Individual Time: 1010-1050 SLP Individual Time Calculation (min): 40 min  Short Term Goals: Week 1: SLP Short Term Goal 1 (Week 1): Pt will consume dysphagia 3 diet with thin liquids and Min A for compensatory swallow strategies.  SLP Short Term Goal 2 (Week 1): Pt will utilize compensatory memory strategies to recall dialy activities with Min A cues.  SLP Short Term Goal 3 (Week 1): Pt will demonstrate intellectual awareness by listing 2 physical and 2 cognitive-linguistic/dysphagia deficits related to recent hospitalization with Mod A cues.  SLP Short Term Goal 4 (Week 1): Pt will follow 2 step directions with Min A cues in 9 of 10 opportunities.  SLP Short Term Goal 5 (Week 1): Pt will complete semi-complex problem solving tasks with Mod A cues.  SLP Short Term Goal 6 (Week 1): Pt will implement speech intelligibility strategies at the phrase level to increase speech intelligibility to ~ 75% with Mod A cues.   Skilled Therapeutic Interventions: Skilled treatment session focused on speech goals. SLP facilitated session by providing total A for recall of speech intelligibility strategies and overall Mod A verbal cues for utilization of a slow rate and over-articulation at the phrase level during a verbal description task to achieve ~90% intelligibility. Patient appeared to demonstrate low frustration tolerance intermittently throughout session, especially when re-educated in regards to use of speech strategies and recall of procedures to task. Patient left upright in bed with alarm on and all needs within reach. Continue with current plan of care.      Function:   Cognition Comprehension Comprehension assist level: Understands basic 75 - 89% of the time/ requires cueing 10 - 24% of the time  Expression   Expression assist level: Expresses  basic 50 - 74% of the time/requires cueing 25 - 49% of the time. Needs to repeat parts of sentences.  Social Interaction Social Interaction assist level: Interacts appropriately 75 - 89% of the time - Needs redirection for appropriate language or to initiate interaction.  Problem Solving Problem solving assist level: Solves basic 75 - 89% of the time/requires cueing 10 - 24% of the time  Memory Memory assist level: Recognizes or recalls 50 - 74% of the time/requires cueing 25 - 49% of the time    Pain No/Denies Pain   Therapy/Group: Individual Therapy  Aury Scollard 03/08/2018, 12:48 PM

## 2018-03-08 NOTE — Progress Notes (Signed)
Occupational Therapy Session Note  Patient Details  Name: Wayne Buckley MRN: [id number] Date of Birth: Nov 12, 1950  Today's Date: 03/08/2018 OT Individual Time: 1400-1425 OT Individual Time Calculation (min): 25 min    Short Term Goals: Week 1:  OT Short Term Goal 1 (Week 1): Pt will perform toilet transfer with max A. OT Short Term Goal 2 (Week 1): Pt will perform UB dressing with mod A.  OT Short Term Goal 3 (Week 1): Pt will engaged in 15 minutes of functional task with 1 rest break or less.   Skilled Therapeutic Interventions/Progress Updates:    1;1. Pt received while RN finishing cath procedure. Pt requesting to groom at sink. Pt HOH A to shave with electric razor with VC for shaving all parts of face and problem solving flipping razor for improved angle to shave neck. Pt able to turn on/off faucet to wash face with washcloth. Pt requesting to return to bed with MAX A of 2 to slide board back to bed with VC for hand placement, trunk flexion and head hips relationship. Exited session with pt supine in bed, call ight in reahc and all needs met  Therapy Documentation Precautions:  Precautions Precautions: Fall, Cervical Restrictions Weight Bearing Restrictions: No General:   Other Treatments:    See Function Navigator for Current Functional Status.   Therapy/Group: Individual Therapy  Tonny Branch 03/08/2018, 2:29 PM

## 2018-03-08 NOTE — Care Management (Signed)
[facility]patient Rehabilitation [facility] Individual Statement of Services  Patient Name:  Wayne Buckley  Date:  03/08/2018  Welcome to the [facility]patient [facility].  Our goal is to provide you with an individualized program based on your diagnosis and situation, designed to meet your specific needs.  With this comprehensive rehabilitation program, you will be expected to participate in at least 3 hours of rehabilitation therapies Monday-Friday, with modified therapy programming on the weekends.  Your rehabilitation program will include the following services:  Physical Therapy (PT), Occupational Therapy (OT), Speech Therapy (ST), 24 hour per day rehabilitation nursing, Therapeutic Recreaction (TR), Neuropsychology, Case Management (Social Worker), Rehabilitation Medicine, Nutrition Services and Pharmacy Services  Weekly team conferences will be held on Tuesdays to discuss your progress.  Your Social Worker will talk with you frequently to get your input and to update you on team discussions.  Team conferences with you and your family in attendance may also be held.  Expected length of stay: 25-28 days   Overall anticipated outcome: minimal/moderate assistance  Depending on your progress and recovery, your program may change. Your Social Worker will coordinate services and will keep you informed of any changes. Your Social Worker's name and contact numbers are listed  below.  The following services may also be recommended but are not provided by the Inpatient [facility]:   Driving Evaluations  Home Health Rehabiltiation Services  Outpatient Rehabilitation Services   Arrangements will be made to provide these services after discharge if needed.  Arrangements include referral to agencies that provide these services.  Your insurance has been verified to be:  Medicare and BCBS Your primary doctor is:  Tenny CrawRoss  Pertinent information will be shared with your doctor and your insurance  company.  Social Worker:  [place] Ranch EstatesLucy Mccoy Testa, [place] [phone] or [phone]) [phone]   Information discussed with and copy given to patient by: [name], [name], 03/08/2018, 2:24 PM

## 2018-03-08 NOTE — Progress Notes (Signed)
ANTICOAGULATION CONSULT NOTE  Pharmacy Consult for apixaban Indication: DVT  No Known Allergies  Patient Measurements: Height: 5\' 11"  (180.3 cm) IBW/kg (Calculated) : 75.3 Heparin Dosing Weight: 95.8 kg  Vital Signs: Temp: 97.3 F (36.3 C) (09/20 0553) BP: 138/85 (09/20 0553) Pulse Rate: 75 (09/20 0553)  Labs: Recent Labs    03/06/18 0413 03/06/18 1154 03/07/18 0550 03/07/18 0741 03/08/18 0613  HGB 13.4  --   --  13.7 13.6  HCT 40.5  --   --  41.9 40.3  PLT 526*  --   --  541* 553*  HEPARINUNFRC 0.34 0.50 0.45  --   --   CREATININE 0.63  --   --   --   --     Estimated Creatinine Clearance: 106.4 mL/min (by C-G formula based on SCr of 0.63 mg/dL).   Medical History: Past Medical History:  Diagnosis Date  . Hypertension   . TBI (traumatic brain injury) [facility](HCC) 2013   Assessment: 67 yr old male to begin IV heparin in am on 9/17 for extensive LLE DVT now s/p IVC filter 9/11 and s/p cervical ACDF surgery on 9/11. Neurosurg ok'd starting IV heparin on 9/17 - conservatively with no boluses. Last heparin level is 0.45. Hgb and plts ok. Plan to transition to apixaban on 9/20.   Apixaban has been ordered to be started. We will start with the load then maintenance. Age<80, wt >60kg, scr 0.63, hgb 13.6, plt 553  Goal of Therapy:  Heparin level 0.3-0.5 units/ml (lower end of therapeutic range) Monitor platelets by anticoagulation protocol: Yes   Plan:    Dc heparin Apixaban 10mg  PO BID x 7 days then 5mg  BID Monitor daily heparin level, CBC, s/s of bleed  Ulyses SouthwardMinh Lucca Ballo, PharmD, LedyardBCIDP, AAHIVP, CPP Infectious Disease Pharmacist Pager: [phone] 03/08/2018 9:41 AM

## 2018-03-08 NOTE — Progress Notes (Signed)
[place] PHYSICAL MEDICINE & REHABILITATION     PROGRESS NOTE    Subjective/Complaints: Had a good night of sleep. RN reports he didn't want suppository this morning, prefers evening.   ROS: Patient denies fever, rash, sore throat, blurred vision, nausea, vomiting, diarrhea, cough, shortness of breath or chest pain, joint or back pain, headache, or mood change.   Objective:   No results found. Recent Labs    03/07/18 0741 03/08/18 0613  WBC 24.1* 24.0*  HGB 13.7 13.6  HCT 41.9 40.3  PLT 541* 553*   Recent Labs    03/06/18 0413  NA 135  K 4.2  CL 98  GLUCOSE 123*  BUN 23  CREATININE 0.63  CALCIUM 8.5*   CBG (last 3)  No results for input(s): GLUCAP in the last 72 hours.  Wt Readings from Last 3 Encounters:  03/02/18 99.8 kg  08/07/12 88.2 kg  06/24/12 84.6 kg     Intake/Output Summary (Last 24 hours) at 03/08/2018 0916 Last data filed at 03/08/2018 0209 Gross per 24 hour  Intake 869.34 ml  Output 2500 ml  Net -1630.66 ml    Vital Signs: Blood pressure 138/85, pulse 75, temperature (!) 97.3 F (36.3 C), resp. rate 20, height 5\' 11"  (1.803 m), SpO2 97 %. Physical Exam:  Constitutional: No distress . Vital signs reviewed. HEENT: EOMI, oral membranes moist Neck: supple Cardiovascular: RRR without murmur. No JVD    Respiratory: CTA Bilaterally without wheezes or rales. Normal effort    GI: BS +, non-tender, non-distended  Left lower extremity with 1 to 2+ edema. Non erythematous Neurological: He isalertand oriented to person, place, and time.speech dysarthric Biceps and deltoids are 5 out of 5. triceps and HI remain 3/5. Right lower extremity grossly 4 out of 5 proximal distal. Left lower extremity is able to move at all levels but is limited due to pain and swelling.   Sensory function sl decreased to light touch below the level of his injury. Sl tone LLE 1/4 Skin: Skin iswarm.  Psychiatric: pleasant. sl impulsive   Assessment/Plan: 1.  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 Physiatrist is providing close team supervision and 24 hour management of active medical problems listed below. Physiatrist and rehab team continue to assess barriers to discharge/monitor patient progress toward functional and medical goals.  Function:  Bathing Bathing position Bathing activity did not occur: Refused    Bathing parts      Bathing assist        Upper Body Dressing/Undressing Upper body dressing Upper body dressing/undressing activity did not occur: Refused What is the patient wearing?: Pull over shirt/dress       Pull over shirt/dress - Perfomed by helper: Thread/unthread right sleeve, Put head through opening, Thread/unthread left sleeve, Pull shirt over trunk        Upper body assist        Lower Body Dressing/Undressing Lower body dressing Lower body dressing/undressing activity did not occur: Refused What is the patient wearing?: Pants, Non-skid slipper socks, Ted Hose       Pants- Performed by helper: Thread/unthread right pants leg, Thread/unthread left pants leg, Pull pants up/down   Non-skid slipper socks- Performed by helper: Don/doff right sock, Don/doff left sock               TED Hose - Performed by helper: Don/doff right TED hose, Don/doff left TED hose  Lower body  assist Assist for lower body dressing: Touching or steadying assistance (Pt > 75%)      Toileting Toileting     Toileting steps completed by helper: Adjust clothing prior to toileting, Performs perineal hygiene, Adjust clothing after toileting    Toileting assist Assist level: Two helpers   Transfers Chair/bed transfer   Chair/bed transfer method: Lateral scoot Chair/bed transfer assist level: 2 helpers Chair/bed transfer assistive device: Sliding board Mechanical lift: Landscape architect  Ambulation activity did not occur: Safety/medical Investment banker, operational activity did not occur: Safety/medical concerns Type: Manual Max wheelchair distance: 60 Assist Level: Supervision or verbal cues  Cognition Comprehension Comprehension assist level: Understands basic 75 - 89% of the time/ requires cueing 10 - 24% of the time  Expression Expression assist level: Expresses basic 50 - 74% of the time/requires cueing 25 - 49% of the time. Needs to repeat parts of sentences.  Social Interaction Social Interaction assist level: Interacts appropriately 75 - 89% of the time - Needs redirection for appropriate language or to initiate interaction.  Problem Solving Problem solving assist level: Solves basic 90% of the time/requires cueing < 10% of the time  Memory Memory assist level: Recognizes or recalls 75 - 89% of the time/requires cueing 10 - 24% of the time   Medical Problem List and Plan: 1.Decreased functional mobilitysecondary to C6-7 cord compression.S/PACDF C6-7 02/28/2018 as well as history of TBI/SDH 2013 -continue therapies 2. DVT Prophylaxis/Anticoagulation: Extensive left lower extremity DVT status post IVC filter 02/27/2018. IV heparin initiated 03/04/2018 ---change to eliquis today   3. Pain Management:Tylenol as needed 4. Mood:Provide emotional support 5. Neuropsych: This patientiscapable of making decisions on hisown behalf. 6. Skin/Wound Care:Routine skin checks 7. Fluids/Electrolytes/Nutrition:  -encourage PO  8.Leukocytosis. Steroid-induced, ?reactive due to DVT.   -Urine study negative  -afebrile, exam remains benign 9.Hypertension. HCTZ 25 mg daily, Coreg 12.5mg  twice daily, Norvasc 10 mg daily,hydralazine 100 mg every 8 hours.   -no orthostasis  -BP controlled 10.BPH/neurogenic bladder -Flomax 0.4 mg daily -  I/O caths to keep volume between 300-500   -no spontaneous voids yet      -consider urecholine trial if he begins to have better sense of need to void 11.?neurogenic bowel: -begin  bowel program with suppository---pt prefers evening, so will schedule for 8pm 12.History of alcohol use. Provide counseling  LOS (Days) 3 A FACE TO FACE EVALUATION WAS PERFORMED  Ranelle Oyster, MD 03/08/2018 9:16 AM

## 2018-03-08 NOTE — Progress Notes (Signed)
Occupational Therapy Session Note  Patient Details  Name: Kirkland HunJames M Ramdass MRN: [id number] Date of Birth: 09/18/1950  Today's Date: 03/08/2018 OT Individual Time: [id number] OT Individual Time Calculation (min): 30 min   Short Term Goals: Week 1:  OT Short Term Goal 1 (Week 1): Pt will perform toilet transfer with max A. OT Short Term Goal 2 (Week 1): Pt will perform UB dressing with mod A.  OT Short Term Goal 3 (Week 1): Pt will engaged in 15 minutes of functional task with 1 rest break or less.   Skilled Therapeutic Interventions/Progress Updates:    Pt greeted in bed with family present. Agreeable to makeup therapy time with encouragement. To work on UE coordination and strength, guided pt through UB stretches and then therapeutic exercises using 3# bar x11 reps. He often needed [facility] feedback to follow through with correct technique. At end of session he remained in bed with all needs, family present, and bed alarm set.   Therapy Documentation Precautions:  Precautions Precautions: Fall, Cervical Restrictions Weight Bearing Restrictions: No Vital Signs: Therapy Vitals Pulse Rate: 87 Resp: 16 BP: 125/84 Patient Position (if appropriate): Lying Oxygen Therapy SpO2: 99 % O2 Device: Room Air Pain: No c/o pain during session    :    See Function Navigator for Current Functional Status.   Therapy/Group: Individual Therapy  Ellar Hakala A Mykel Sponaugle 03/08/2018, 3:58 PM

## 2018-03-08 NOTE — Progress Notes (Signed)
Physical Therapy Session Note  Patient Details  Name: Wayne Buckley MRN: [id number] Date of Birth: Mar 29, 1951  Today's Date: 03/08/2018 PT Individual Time: 1300-1400 PT Individual Time Calculation (min): 60 min   Short Term Goals: Week 1:  PT Short Term Goal 1 (Week 1): Pt will demonstrate bed mobility with mod assist using bed features. PT Short Term Goal 2 (Week 1): Pt will transition sit<>stand with +2 assist and without power lift equipment.  PT Short Term Goal 3 (Week 1): Pt will propel w/c 25' with min assist.   Skilled Therapeutic Interventions/Progress Updates:    no c/o pain.  Pt does report dizziness intermittently throughout session when asked to forward weight shift.  Session focus on functional transfers and activity tolerance.    Pt requires mod assist to bring LEs off EOB for supine>sit and max cues to utilize [facility] elevated and bedrails to assist with bringing trunk upright.  Pt transfers with slide board to L and R throughout session with total +2 to position board and transfer (total +1 for forward weight shift, and total +2 for lateral scoot).  Sit<>stand x3 at side of // bars focus on forward weight shift and push up through UEs to elevate trunk.  Pt requires +2 for task but demos improved forward weight shift and good push through LEs to power up.  Able to maintain LEs extended in standing without knee buckling.  W/C propulsion x150' at end of session, pt requires 5 rest breaks during that distance and max encouragement to push self to go farther than 20'.  Returned to room at end of session total assist, chair alarm intact, call bell in reach and needs met.   Therapy Documentation Precautions:  Precautions Precautions: Fall, Cervical Restrictions Weight Bearing Restrictions: No   See Function Navigator for Current Functional Status.   Therapy/Group: Individual Therapy  [name] 03/08/2018, 1:59 PM

## 2018-03-08 NOTE — Progress Notes (Signed)
Occupational Therapy Session Note  Patient Details  Name: Wayne Buckley MRN: [id number] Date of Birth: 05/07/1951  Today's Date: 03/08/2018 OT Individual Time: 1104-1200 OT Individual Time Calculation (min): 56 min   Short Term Goals: Week 1:  OT Short Term Goal 1 (Week 1): Pt will perform toilet transfer with max A. OT Short Term Goal 2 (Week 1): Pt will perform UB dressing with mod A.  OT Short Term Goal 3 (Week 1): Pt will engaged in 15 minutes of functional task with 1 rest break or less.   Skilled Therapeutic Interventions/Progress Updates:    Pt greeted in bed with no c/o pain. Amenable to tx with encouragement. Tx focus on sitting balance, activity tolerance, and active participation during bathing and dressing tasks completed EOB and bedlevel. 2 helpers for safe transition to EOB to complete UB self care. Pt with truncal ataxia however able to keep balance 95% of the time with steadying assist and unilateral UE support on bedrail. Also able to self correct posterior LOBs with UE support. Pt required mod vcs for sequencing and thoroughness while bathing. While washing feet, OT positioned LEs in modified figure 4 to increase LE flexibility for LB self care tasks. Pt reported feeling slightly dizzy, which he said improved with time. BP: 112/84 when he first mentioned dizziness. He donned an overhead shirt with Min A and Mod A for dynamic balance. Then transitioned to bedlevel for pericare completion and donning pants. Mod A rolling Rt>Lt with facilitation of proper LE placement. He was able to assist with elevating pants over hips while bridging. Also assisted with boosting himself up in bed using headboard. At end of session pt was repositioned in bed for comfort and left with all needs within reach and bed alarm set.     Therapy Documentation Precautions:  Precautions Precautions: Fall, Cervical Restrictions Weight Bearing Restrictions: No Pain: Pt denied pain    ADL:      See  Function Navigator for Current Functional Status.   Therapy/Group: Individual Therapy  Daymian Lill A Jawan Chavarria 03/08/2018, 12:30 PM

## 2018-03-09 DIAGNOSIS — Z86718 Personal history of other venous thrombosis and embolism: Secondary | ICD-10-CM

## 2018-03-09 DIAGNOSIS — I10 Essential (primary) hypertension: Secondary | ICD-10-CM

## 2018-03-09 DIAGNOSIS — N319 Neuromuscular dysfunction of bladder, unspecified: Secondary | ICD-10-CM

## 2018-03-09 DIAGNOSIS — Z09 Encounter for follow-up examination after completed treatment for conditions other than malignant neoplasm: Secondary | ICD-10-CM

## 2018-03-09 DIAGNOSIS — S14105S Unspecified injury at C5 level of cervical spinal cord, sequela: Secondary | ICD-10-CM

## 2018-03-09 LAB — CBC
HEMATOCRIT: 37 % — AB (ref 39.0–52.0)
HEMOGLOBIN: 12.5 g/dL — AB (ref 13.0–17.0)
MCH: 29.9 pg (ref 26.0–34.0)
MCHC: 33.8 g/dL (ref 30.0–36.0)
MCV: 88.5 fL (ref 78.0–100.0)
Platelets: 550 10*3/uL — ABNORMAL HIGH (ref 150–400)
RBC: 4.18 MIL/uL — ABNORMAL LOW (ref 4.22–5.81)
RDW: 13.2 % (ref 11.5–15.5)
WBC: 22.9 10*3/uL — ABNORMAL HIGH (ref 4.0–10.5)

## 2018-03-09 NOTE — Progress Notes (Signed)
Pt did not want to be awaken to bladder scan. Pt notes please do not wake him for breakfast leave tray in room. His wishes is to sleep late.

## 2018-03-09 NOTE — Progress Notes (Signed)
[place] PHYSICAL MEDICINE & REHABILITATION     PROGRESS NOTE    Subjective/Complaints: No LE pain , pt feels LLE swelling improved,  ROS: Denies chest pain shortness of breath nausea vomiting diarrhea constipation Objective:   No results found. Recent Labs    03/08/18 0613 03/09/18 0702  WBC 24.0* 22.9*  HGB 13.6 12.5*  HCT 40.3 37.0*  PLT 553* 550*   No results for input(s): NA, K, CL, GLUCOSE, BUN, CREATININE, CALCIUM in the last 72 hours.  Invalid input(s): CO CBG (last 3)  No results for input(s): GLUCAP in the last 72 hours.  Wt Readings from Last 3 Encounters:  03/02/18 99.8 kg  08/07/12 88.2 kg  06/24/12 84.6 kg     Intake/Output Summary (Last 24 hours) at 03/09/2018 1235 Last data filed at 03/09/2018 1130 Gross per 24 hour  Intake 574 ml  Output 3225 ml  Net -2651 ml    Vital Signs: Blood pressure (!) 134/92, pulse 86, temperature 98.2 F (36.8 C), resp. rate 18, height 5\' 11"  (1.803 m), SpO2 97 %. Physical Exam:  Constitutional: No distress . Vital signs reviewed. HEENT: EOMI, oral membranes moist Neck: supple Cardiovascular: RRR without murmur. No JVD    Respiratory: CTA Bilaterally without wheezes or rales. Normal effort    GI: BS +, non-tender, non-distended  Left lower extremity with 1 to 2+ edema. Non erythematous Neurological: He isalertand oriented to person, place, and time.speech dysarthric Biceps and deltoids are 5 out of 5. triceps and HI remain 3/5. Right lower extremity grossly 4 out of 5 proximal distal. Left lower extremity is able to move at all levels but is limited due to pain and swelling.   Sensory function sl decreased to light touch below the level of his injury. Sl tone LLE 1/4 Skin: Skin iswarm.  Psychiatric: pleasant. sl impulsive   Assessment/Plan: 1. 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 Physiatrist is providing close team supervision and 24 hour management of active medical problems listed below. Physiatrist and rehab team continue to assess barriers to discharge/monitor patient progress toward functional and medical goals.  Function:  Bathing Bathing position Bathing activity did not occur: Refused Position: Other (comment)(EOB UB + LEs and bedlevel perihygiene)  Bathing parts Body parts bathed by patient: Right arm, Left arm, Chest, Abdomen, Right upper leg, Left upper leg Body parts bathed by helper: Front perineal area, Buttocks, Right lower leg, Left lower leg  Bathing assist Assist Level: (Mod A)      Upper Body Dressing/Undressing Upper body dressing Upper body dressing/undressing activity did not occur: Refused What is the patient wearing?: Pull over shirt/dress     Pull over shirt/dress - Perfomed by patient: Thread/unthread right sleeve, Thread/unthread left sleeve, Put head through opening Pull over shirt/dress - Perfomed by helper: Pull shirt over trunk        Upper body assist Assist Level: Touching or steadying assistance(Pt > 75%)      Lower Body Dressing/Undressing Lower body dressing Lower body dressing/undressing activity did not occur: Refused What is the patient wearing?: Pants, Non-skid slipper socks       Pants- Performed by helper: Thread/unthread right pants leg, Thread/unthread left pants leg, Pull pants up/down   Non-skid slipper socks- Performed by helper: Don/doff right sock, Don/doff left sock               TED Hose - Performed by helper: Don/doff  right TED hose, Don/doff left TED hose  Lower body assist Assist for lower body dressing: (Total A)      Toileting Toileting     Toileting steps completed by helper: Adjust clothing prior to toileting, Performs perineal hygiene, Adjust clothing after toileting    Toileting assist Assist level: Two helpers   Transfers Chair/bed transfer   Chair/bed transfer  method: Lateral scoot Chair/bed transfer assist level: 2 helpers Chair/bed transfer assistive device: Sliding board Mechanical lift: Landscape architect Ambulation activity did not occur: Safety/medical Investment banker, operational activity did not occur: Safety/medical concerns Type: Manual Max wheelchair distance: 20 Assist Level: Supervision or verbal cues  Cognition Comprehension Comprehension assist level: Understands basic 75 - 89% of the time/ requires cueing 10 - 24% of the time  Expression Expression assist level: Expresses basic 50 - 74% of the time/requires cueing 25 - 49% of the time. Needs to repeat parts of sentences.  Social Interaction Social Interaction assist level: Interacts appropriately 75 - 89% of the time - Needs redirection for appropriate language or to initiate interaction.  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 DVT Prophylaxis/Anticoagulation: Extensive left lower extremity DVT status post IVC filter 02/27/2018.  no obvious bleeding on Eliquis hemo globin down from 13.6-12.5 today 3. Pain Management:Tylenol as needed 4. Mood:Provide emotional support 5. Neuropsych: This patientiscapable of making decisions on hisown behalf. 6. Skin/Wound Care:Routine skin checks 7. Fluids/Electrolytes/Nutrition:  -encourage PO  8.Leukocytosis. Steroid-induced, ?reactive due to DVT.   -Urine study negative  -No fevers no obvious source of infection 9.Hypertension. HCTZ 25 mg daily, Coreg 12.5mg  twice daily, Norvasc 10 mg  daily,hydralazine 100 mg every 8 hours.   -no orthostasis  -BP controlled 10.BPH/neurogenic bladder -Flomax 0.4 mg daily -  I/O caths to keep volume between 300-500   -no spontaneous voids yet     -consider urecholine trial if he begins to have better sense of need to void 11.?neurogenic bowel: -begin  bowel program with suppository---pt prefers evening, so will schedule for 8pm 12.History of alcohol use. Provide counseling  LOS (Days) 4 A FACE TO FACE EVALUATION WAS PERFORMED  Erick Colace, MD 03/09/2018 12:35 PM

## 2018-03-10 ENCOUNTER — Inpatient Hospital Stay (HOSPITAL_COMMUNITY): Payer: Medicare Other

## 2018-03-10 ENCOUNTER — Inpatient Hospital Stay (HOSPITAL_COMMUNITY): Payer: Medicare Other | Admitting: Occupational Therapy

## 2018-03-10 LAB — CBC
HCT: 36.8 % — ABNORMAL LOW (ref 39.0–52.0)
HEMOGLOBIN: 12.4 g/dL — AB (ref 13.0–17.0)
MCH: 30.2 pg (ref 26.0–34.0)
MCHC: 33.7 g/dL (ref 30.0–36.0)
MCV: 89.5 fL (ref 78.0–100.0)
PLATELETS: 544 10*3/uL — AB (ref 150–400)
RBC: 4.11 MIL/uL — AB (ref 4.22–5.81)
RDW: 13.3 % (ref 11.5–15.5)
WBC: 20.5 10*3/uL — ABNORMAL HIGH (ref 4.0–10.5)

## 2018-03-10 NOTE — Progress Notes (Signed)
Occupational Therapy Session Note  Patient Details  Name: Wayne Buckley MRN: [id number] Date of Birth: 09/28/1950  Today's Date: 03/10/2018 OT Individual Time: [id number] and 1300-1315 OT Individual Time Calculation (min): 57 min and 15 min 45 minutes missed  Short Term Goals: Week 1:  OT Short Term Goal 1 (Week 1): Pt will perform toilet transfer with max A. OT Short Term Goal 2 (Week 1): Pt will perform UB dressing with mod A.  OT Short Term Goal 3 (Week 1): Pt will engaged in 15 minutes of functional task with 1 rest break or less.   Skilled Therapeutic Interventions/Progress Updates:    Pt greeted in bed with no c/o pain. Requesting to eat breakfast and requiring encouragement to sit EOB to do so. Started session with focus on UE coordination and sitting balance while self feeding EOB. Pt able to maintain balance with close supervision and use eating utensils without adaptations. His movements were ataxic, but able to meet task demands with min spillage. Also able to open brown sugar package himself! Assist required for smaller packages. Cues provided for thorough oral clearing and small bites. He had 2 posterior LOBs and required Mod A and cues to correct. Afterwards transitioned to supine for brief change. Pt rolled Rt>Lt with Mod A and manual placement of LEs. Total A for perihygiene post BM. He was able to pull himself up in bed with use of headboard and instruction when bed was in trendelenberg position. At end of session pt was left in bed with all needs within reach and bed alarm set.   2nd Session 1:1 tx (15 min) Pt greeted in w/c. Requesting to return to bed. Provided pt with encouragement to participate in OT. [facility] with pt still refusing. "I'm not going to argue." Slideboard<bed completed with 2 assist with cues for head/hips relationship and manual facilitation for anterior weight shifting. He boosted himself up in bed with max encouragement and  instruction for hand placement. At end of session pt was left with all needs within reach and bed alarm set.   Therapy Documentation Precautions:  Precautions Precautions: Fall, Cervical Restrictions Weight Bearing Restrictions: No Pain: No c/o pain during tx    ADL:       See Function Navigator for Current Functional Status.   Therapy/Group: Individual Therapy  Jaydin Jalomo A Magaline Steinberg 03/10/2018, 12:33 PM

## 2018-03-10 NOTE — Progress Notes (Signed)
Physical Therapy Session Note  Patient Details  Name: Wayne Buckley MRN: [id number] Date of Birth: 05/23/1951  Today's Date: 03/10/2018 PT Individual Time: 1102-1159 PT Individual Time Calculation (min): 57 min   Short Term Goals: Week 1:  PT Short Term Goal 1 (Week 1): Pt will demonstrate bed mobility with mod assist using bed features. PT Short Term Goal 2 (Week 1): Pt will transition sit<>stand with +2 assist and without power lift equipment.  PT Short Term Goal 3 (Week 1): Pt will propel w/c 25' with min assist.   Skilled Therapeutic Interventions/Progress Updates:    Pt supine in bed upon PT arrival, agreeable to therapy tx and denies pain. Pt donned shorts while in bed, mod assist for rolling in both directions and pt able to pull shorts over hips with hand over hand assist to find waistband. Pt transferred to sitting EOB with max assist. Pt performed slideboard transfer from bed>w/c with max assist +2, verbal cues for techniques and manual facilitation to keep pt anteriorly weightshifted. Pt propelled w/c to the gym x 150 ft with supervision and x 5 rest breaks during. Pt performed slideboard transfer from w/c<>mat x 1 with max assist +2 and x 1 transfer with max assist +1, therapist providing manual facliitation and verbal cues for anterior weightshift. Pt worked on dynamic seated balance to hit beach ball back and forth, worked on anterior weightshift to pick beach ball up off floor, min assist. Max verbal cues throughout session for handplacement as pt tries to reach for/pull on therapist or other various objects. Pt propelled w/c back to room and left seated with needs in reach and chair alarm set.   Therapy Documentation Precautions:  Precautions Precautions: Fall, Cervical Restrictions Weight Bearing Restrictions: No   See Function Navigator for Current Functional Status.   Therapy/Group: Individual Therapy  Cresenciano GenreEmily van Schagen , PT, DPT 03/10/2018, 7:55 AM

## 2018-03-10 NOTE — Discharge Instructions (Signed)
Inpatient Rehab Discharge Instructions  JASKIRAT SCHWIEGER Discharge date and time: No discharge date for patient encounter.   Activities/Precautions/ Functional Status: Activity: activity as tolerated Diet: regular diet Wound Care: keep wound clean and dry Functional status:  ___ No restrictions     ___ Walk up steps independently ___ 24/7 supervision/assistance   ___ Walk up steps with assistance ___ Intermittent supervision/assistance  ___ Bathe/dress independently ___ Walk with walker     _x__ Bathe/dress with assistance ___ Walk Independently    ___ Shower independently ___ Walk with assistance    ___ Shower with assistance ___ No alcohol     ___ Return to work/school ________  Special Instructions: No driving   My questions have been answered and I understand these instructions. I will adhere to these goals and the provided educational materials after my discharge from the hospital.  Patient/Caregiver Signature _______________________________ Date __________  Clinician Signature _______________________________________ Date __________  Please bring this form and your medication list with you to all your follow-up doctor's appointments.      Information on my medicine - ELIQUIS (apixaban)  This medication education was reviewed with me or my healthcare representative as part of my discharge preparation.  The pharmacist that spoke with me during my hospital stay was:  Lawerance Bach, [facility]  Why was Eliquis prescribed for you? Eliquis was prescribed to treat blood clots that may have been found in the veins of your legs (deep vein thrombosis) or in your lungs (pulmonary embolism) and to reduce the risk of them occurring again.  What do You need to know about Eliquis ? The starting dose is 10 mg (two 5 mg tablets) taken TWICE daily for the FIRST SEVEN (7) DAYS, then on 9/27 the dose is reduced to ONE 5 mg tablet taken TWICE daily.  Eliquis may be taken with or without food.     Try to take the dose about the same time in the morning and in the evening. If you have difficulty swallowing the tablet whole please discuss with your pharmacist how to take the medication safely.  Take Eliquis exactly as prescribed and DO NOT stop taking Eliquis without talking to the doctor who prescribed the medication.  Stopping may increase your risk of developing a new blood clot.  Refill your prescription before you run out.  After discharge, you should have regular check-up appointments with your healthcare provider that is prescribing your Eliquis.    What do you do if you miss a dose? If a dose of ELIQUIS is not taken at the scheduled time, take it as soon as possible on the same day and twice-daily administration should be resumed. The dose should not be doubled to make up for a missed dose.  Important Safety Information A possible side effect of Eliquis is bleeding. You should call your healthcare provider right away if you experience any of the following: ? Bleeding from an injury or your nose that does not stop. ? Unusual colored urine (red or dark brown) or unusual colored stools (red or black). ? Unusual bruising for unknown reasons. ? A serious fall or if you hit your head (even if there is no bleeding).  Some medicines may interact with Eliquis and might increase your risk of bleeding or clotting while on Eliquis. To help avoid this, consult your healthcare provider or pharmacist prior to using any new prescription or non-prescription medications, including herbals, vitamins, non-steroidal anti-inflammatory drugs (NSAIDs) and supplements.  This website has more information  on Eliquis (apixaban): http://www.eliquis.com/eliquis/home

## 2018-03-10 NOTE — Progress Notes (Signed)
[place] PHYSICAL MEDICINE & REHABILITATION     PROGRESS NOTE    Subjective/Complaints: According to nursing, patient has been refusing nighttime caths.  His cath volumes have been up to 1300 Recorded intake is only 600ml on 01/06/2018 We discussed the risks of excessively high volumes including hydronephrosis and even pyelonephritis ROS: Denies chest pain shortness of breath nausea vomiting diarrhea constipation Objective:   No results found. Recent Labs    03/09/18 0702 03/10/18 0911  WBC 22.9* 20.5*  HGB 12.5* 12.4*  HCT 37.0* 36.8*  PLT 550* 544*   No results for input(s): NA, K, CL, GLUCOSE, BUN, CREATININE, CALCIUM in the last 72 hours.  Invalid input(s): CO CBG (last 3)  No results for input(s): GLUCAP in the last 72 hours.  Wt Readings from Last 3 Encounters:  03/02/18 99.8 kg  08/07/12 88.2 kg  06/24/12 84.6 kg     Intake/Output Summary (Last 24 hours) at 03/10/2018 1036 Last data filed at 03/10/2018 0400 Gross per 24 hour  Intake 480 ml  Output 3175 ml  Net -2695 ml    Vital Signs: Blood pressure 123/89, pulse 73, temperature 97.8 F (36.6 C), resp. rate 18, height 5\' 11"  (1.803 m), SpO2 98 %. Physical Exam:  Constitutional: No distress . Vital signs reviewed. HEENT: EOMI, oral membranes moist Neck: supple Cardiovascular: RRR without murmur. No JVD    Respiratory: CTA Bilaterally without wheezes or rales. Normal effort    GI: BS +, non-tender, non-distended  Left lower extremity with 1 to 2+ edema. Non erythematous Neurological: He isalertand oriented to person, place, and time.speech dysarthric Biceps and deltoids are 5 out of 5. triceps and HI remain 3/5. Right lower extremity grossly 4 out of 5 proximal distal. Left lower extremity is able to move at all levels but is limited due to pain and swelling.   Sensory function sl decreased to light touch below the level of his injury.  Patient has ataxia bilateral upper and lower limbs Skin:  Skin iswarm.  Psychiatric: pleasant. sl impulsive   Assessment/Plan: 1. 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 Physiatrist is providing close team supervision and 24 hour management of active medical problems listed below. Physiatrist and rehab team continue to assess barriers to discharge/monitor patient progress toward functional and medical goals.  Function:  Bathing Bathing position Bathing activity did not occur: Refused Position: Other (comment)(EOB UB + LEs and bedlevel perihygiene)  Bathing parts Body parts bathed by patient: Right arm, Left arm, Chest, Abdomen, Right upper leg, Left upper leg Body parts bathed by helper: Front perineal area, Buttocks, Right lower leg, Left lower leg  Bathing assist Assist Level: (Mod A)      Upper Body Dressing/Undressing Upper body dressing Upper body dressing/undressing activity did not occur: Refused What is the patient wearing?: Pull over shirt/dress     Pull over shirt/dress - Perfomed by patient: Thread/unthread right sleeve, Thread/unthread left sleeve, Put head through opening Pull over shirt/dress - Perfomed by helper: Pull shirt over trunk        Upper body assist Assist Level: Touching or steadying assistance(Pt > 75%)      Lower Body Dressing/Undressing Lower body dressing Lower body dressing/undressing activity did not occur: Refused What is the patient wearing?: Pants, Non-skid slipper socks       Pants- Performed by helper: Thread/unthread right pants leg, Thread/unthread left pants leg, Pull pants up/down   Non-skid  slipper socks- Performed by helper: Don/doff right sock, Don/doff left sock               TED Hose - Performed by helper: Don/doff right TED hose, Don/doff left TED hose  Lower body assist Assist for lower body dressing: (Total A)      Toileting Toileting      Toileting steps completed by helper: Adjust clothing prior to toileting, Performs perineal hygiene, Adjust clothing after toileting    Toileting assist Assist level: Two helpers   Transfers Chair/bed transfer   Chair/bed transfer method: Lateral scoot Chair/bed transfer assist level: 2 helpers Chair/bed transfer assistive device: Sliding board Mechanical lift: Landscape architect Ambulation activity did not occur: Safety/medical Investment banker, operational activity did not occur: Safety/medical concerns Type: Manual Max wheelchair distance: 20 Assist Level: Supervision or verbal cues  Cognition Comprehension Comprehension assist level: Understands basic 75 - 89% of the time/ requires cueing 10 - 24% of the time  Expression Expression assist level: Expresses basic 50 - 74% of the time/requires cueing 25 - 49% of the time. Needs to repeat parts of sentences.  Social Interaction Social Interaction assist level: Interacts appropriately 75 - 89% of the time - Needs redirection for appropriate language or to initiate interaction.  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 DVT Prophylaxis/Anticoagulation: Extensive left lower extremity DVT status post IVC filter 02/27/2018.  no obvious bleeding on Eliquis hemo globin down from 13.6-12.5 today, continue to monitor, CBC q. Wednesday 3. Pain Management:Tylenol as needed 4. Mood:Provide emotional support 5. Neuropsych: This patientiscapable of making decisions on hisown behalf. 6.  Skin/Wound Care:Routine skin checks 7. Fluids/Electrolytes/Nutrition:  -encourage PO  8.Leukocytosis. Steroid-induced, ?reactive due to DVT.   -Urine study negative  -No fevers no obvious source of infection 9.Hypertension. HCTZ 25 mg daily, Coreg 12.5mg  twice daily, Norvasc 10 mg daily,hydralazine 100 mg every 8 hours.   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 Provide counseling  LOS (Days) 5 A FACE TO FACE EVALUATION WAS PERFORMED  Erick Colace, MD 03/10/2018 10:36 AM

## 2018-03-11 ENCOUNTER — Inpatient Hospital Stay (HOSPITAL_COMMUNITY): Payer: Medicare Other

## 2018-03-11 ENCOUNTER — Inpatient Hospital Stay (HOSPITAL_COMMUNITY): Payer: Medicare Other | Admitting: Physical Therapy

## 2018-03-11 ENCOUNTER — Inpatient Hospital Stay (HOSPITAL_COMMUNITY): Payer: Medicare Other | Admitting: Occupational Therapy

## 2018-03-11 LAB — CBC
HEMATOCRIT: 35.7 % — AB (ref 39.0–52.0)
Hemoglobin: 11.9 g/dL — ABNORMAL LOW (ref 13.0–17.0)
MCH: 29.8 pg (ref 26.0–34.0)
MCHC: 33.3 g/dL (ref 30.0–36.0)
MCV: 89.3 fL (ref 78.0–100.0)
Platelets: 581 10*3/uL — ABNORMAL HIGH (ref 150–400)
RBC: 4 MIL/uL — ABNORMAL LOW (ref 4.22–5.81)
RDW: 13.2 % (ref 11.5–15.5)
WBC: 19 10*3/uL — ABNORMAL HIGH (ref 4.0–10.5)

## 2018-03-11 NOTE — Progress Notes (Addendum)
Physical Therapy Session Note  Patient Details  Name: Wayne Buckley MRN: [id number] Date of Birth: 02/12/1951  Today's Date: 03/11/2018 PT Individual Time: [id number] PT Individual Time Calculation (min): 41 min   Short Term Goals: Week 1:  PT Short Term Goal 1 (Week 1): Pt will demonstrate bed mobility with mod assist using bed features. PT Short Term Goal 2 (Week 1): Pt will transition sit<>stand with +2 assist and without power lift equipment.  PT Short Term Goal 3 (Week 1): Pt will propel w/c 25' with min assist.   Skilled Therapeutic Interventions/Progress Updates:  Pt received in w/c with wife exiting room. Pt agreeable to tx, denying c/o pain. Pt propelled w/c room>dayroom and hallway outside of gym>nurses station with BUE & supervision, only propelling w/c 10-20 ft at a time before reporting need for rest break but did not state why. Therapist educated pt on ability to push wheels and allow rotations before propelling w/c again to conserve energy but pt not receptive to education. In dayroom pt transferred to standing in standing frame but tolerated it <30 seconds for first trial and ~2 minutes for 2nd trial. After initial trial pt reported dizziness so vitals assessed & RN made aware. While in standing frame pt required max cuing for upright trunk as pt was leaning on standing frame table for support with pt demonstrating poor ability to correct. Pt demonstrates total body ataxia throughout session. Due to dizziness with standing, transitioned to w/c level task requiring him to propel w/c between cones with pt requiring MAX cuing for obstacle avoidance & education regarding purpose of activity. Pt also appears to be self limiting during session as he frequently asks "can't you push me?" with therapist providing encouragement & education. At end of session pt left sitting in w/c in room with chair alarm donned & all needs in reach.   BP in RUE Sitting 2:37 pm: BP = 128/76 mmHg, HR = 101  bpm Standing in standing frame 2:39 pm: BP = 105/48 mmHg, HR = 106 bpm Sitting again 2:41 pm: BP = 115/79 mmHg, HR = 101 bpm   Addendum: Therapist also provides education regarding cervical precautions during session.  Therapy Documentation Precautions:  Precautions Precautions: Fall, Cervical Restrictions Weight Bearing Restrictions: No   See Function Navigator for Current Functional Status.   Therapy/Group: Individual Therapy  Sandi MariscalVictoria M Tanika Bracco 03/11/2018, 3:53 PM

## 2018-03-11 NOTE — Progress Notes (Signed)
[facility]ILIT[facility]     PROGRESS NOTE    Subjective/Complaints: According to nursing, patient has been refusing nighttime caths.  His cath volumes have been up to 1300 Recorded intake is only on 01/06/2018 We discussed the risks of excessively high volumes including hydronephrosis and even pyelonephritis ROS: Denies chest pain shortness of breath nausea vomiting diarrhea constipation Objective:   No results found. Recent Labs    03/10/18 0911 03/11/18 0633  WBC 20.5* 19.0*  HGB 12.4* 11.9*  HCT 36.8* 35.7*  PLT 544* 581*   No results for input(s): NA, K, CL, GLUCOSE, BUN, CREATININE, CALCIUM in the last 72 hours.  Invalid input(s): CO CBG (last 3)  No results for input(s): GLUCAP in the last 72 hours.  Wt Readings from Last 3 Encounters:  03/02/18 99.8 kg  08/07/12 88.2 kg  06/24/12 84.6 kg     Intake/Output Summary (Last 24 hours) at 03/11/2018 0848 Last data filed at 03/11/2018 0300 Gross per 24 hour  Intake 960 ml  Output 2575 ml  Net -1615 ml    Vital Signs: Blood pressure 140/84, pulse 86, temperature (!) 97.5 F (36.4 C), temperature source Oral, resp. rate 18, height 5\' 11"  (1.803 m), SpO2 97 %. Physical Exam:  Constitutional: No distress . Vital signs reviewed. HEENT: EOMI, oral membranes moist Neck: supple Cardiovascular: RRR without murmur. No JVD    Respiratory: CTA Bilaterally without wheezes or rales. Normal effort    GI: BS +, non-tender, non-distended  Left lower extremity with 1 to 2+ edema. Non erythematous Neurological: He isalertand oriented to person, place, and time.speech dysarthric Biceps and deltoids are 5 out of 5. triceps and HI remain 3/5. Right lower extremity grossly 4 out of 5 proximal distal. Left lower extremity is able to move at all levels but is limited due to pain and swelling.   Sensory function sl decreased to light touch below the level of his injury.  Patient has ataxia bilateral  upper and lower limbs Skin: Skin iswarm.  Psychiatric: pleasant. sl impulsive   Assessment/Plan: 1. 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 Physiatrist is providing close team supervision and 24 hour management of active medical problems listed below. Physiatrist and rehab team continue to assess barriers to discharge/monitor patient progress toward functional and medical goals.  Function:  Bathing Bathing position Bathing activity did not occur: Refused Position: Other (comment)(EOB UB + LEs and bedlevel perihygiene)  Bathing parts Body parts bathed by patient: Right arm, Left arm, Chest, Abdomen, Right upper leg, Left upper leg Body parts bathed by helper: Front perineal area, Buttocks, Right lower leg, Left lower leg  Bathing assist Assist Level: (Mod A)      Upper Body Dressing/Undressing Upper body dressing Upper body dressing/undressing activity did not occur: Refused What is the patient wearing?: Pull over shirt/dress     Pull over shirt/dress - Perfomed by patient: Thread/unthread right sleeve, Thread/unthread left sleeve, Put head through opening Pull over shirt/dress - Perfomed by helper: Pull shirt over trunk        Upper body assist Assist Level: Touching or steadying assistance(Pt > 75%)      Lower Body Dressing/Undressing Lower body dressing Lower body dressing/undressing activity did not occur: Refused What is the patient wearing?: Pants, Non-skid slipper socks       Pants- Performed by helper: Thread/unthread right pants leg, Thread/unthread left pants leg, Pull pants  up/down   Non-skid slipper socks- Performed by helper: Don/doff right sock, Don/doff left sock               TED Hose - Performed by helper: Don/doff right TED hose, Don/doff left TED hose  Lower body assist Assist for lower body dressing: (Total A)       Toileting Toileting     Toileting steps completed by helper: Adjust clothing prior to toileting, Performs perineal hygiene, Adjust clothing after toileting    Toileting assist Assist level: Two helpers   Transfers Chair/bed transfer   Chair/bed transfer method: Lateral scoot Chair/bed transfer assist level: Maximal assist (Pt 25 - 49%/lift and lower) Chair/bed transfer assistive device: Armrests, Sliding board Mechanical lift: Stedy   Locomotion Ambulation Ambulation activity did not occur: Safety/medical Investment banker, operationalconcerns         Wheelchair Wheelchair activity did not occur: Safety/medical concerns Type: Manual Max wheelchair distance: 20 Assist Level: Supervision or verbal cues  Cognition Comprehension Comprehension assist level: Understands basic 75 - 89% of the time/ requires cueing 10 - 24% of the time  Expression Expression assist level: Expresses basic 50 - 74% of the time/requires cueing 25 - 49% of the time. Needs to repeat parts of sentences.  Social Interaction Social Interaction assist level: Interacts appropriately 75 - 89% of the time - Needs redirection for appropriate language or to initiate interaction.  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 DVT Prophylaxis/Anticoagulation: Extensive left lower extremity DVT status post IVC filter 02/27/2018.  no obvious bleeding on Eliquis hemo globin down from 13.6-12.5--11.9  9/23  -continue to follow cbc 3. Pain Management:Tylenol as needed 4. Mood:Provide emotional support 5. Neuropsych: This patientiscapable of making  decisions on hisown behalf. 6. Skin/Wound Care:Routine skin checks 7. Fluids/Electrolytes/Nutrition:  -encourage PO  8.Leukocytosis. Steroid-induced, ?reactive due to DVT.   -Urine study negative    9.Hypertension. HCTZ 25 mg daily, Coreg 12.5mg  twice daily, Norvasc 10 mg daily,hydralazine 100 mg every 8 hours.   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 Provide counseling  LOS (Days) 6 A FACE TO FACE EVALUATION WAS PERFORMED  Ranelle OysterZachary T Ruby Logiudice, MD 03/11/2018 8:48 AM

## 2018-03-11 NOTE — Progress Notes (Signed)
Physical Therapy Session Note  Patient Details  Name: Wayne Buckley MRN: [id number] Date of Birth: 07-18-1950  Today's Date: 03/11/2018 PT Individual Time: 1115-1200 PT Individual Time Calculation (min): 45 min   Short Term Goals: Week 1:  PT Short Term Goal 1 (Week 1): Pt will demonstrate bed mobility with mod assist using bed features. PT Short Term Goal 2 (Week 1): Pt will transition sit<>stand with +2 assist and without power lift equipment.  PT Short Term Goal 3 (Week 1): Pt will propel w/c 25' with min assist.   Skilled Therapeutic Interventions/Progress Updates:    no c/o pain.  Session focus on sitting balance and functional transfers.  Pt continues to demo poor trunk control/ataxia and decreased forward weight shift requiring max multimodal cues throughout for safety/transfers.  Pt also requiring max encouragement to participate/push self in therapy, frequently demanding rest breaks following very minimal activity.   Supine>sit with assist for BLEs and to bring trunk upright.  Max cues for use of bedrails and to maintain LUE on bedrail.  Slide board transfers to L and R throughout session, pt demos some improvement in ability to assist with lateral scoot, however still requires max assist +1 just to maintain forward weight shift.  3 slide board transfers throughout session.  Sitting balance edge of mat with close supervision and intermittent min guard during card reaching/matching task.  W/C propulsion 2x100' with max distance of 40' stretches with supervision and MAX encouragement to continue.  Pt returned to room at end of session and positioned upright in w/c with call bell in reach and needs met.   Therapy Documentation Precautions:  Precautions Precautions: Fall, Cervical Restrictions Weight Bearing Restrictions: No   See Function Navigator for Current Functional Status.   Therapy/Group: Individual Therapy  [name] 03/11/2018, 12:07 PM

## 2018-03-11 NOTE — Plan of Care (Signed)
  Problem: Consults Goal: RH SPINAL CORD INJURY PATIENT EDUCATION Description  See Patient Education module for education specifics.  Outcome: Progressing Goal: Skin Care Protocol Initiated - if Braden Score 18 or less Description If consults are not indicated, leave blank or document N/A Outcome: Progressing Goal: Nutrition Consult-if indicated Outcome: Progressing   Problem: SCI BOWEL ELIMINATION Goal: RH STG MANAGE BOWEL WITH ASSISTANCE Description STG Manage Bowel with min Assistance.  Outcome: Progressing Flowsheets (Taken 03/11/2018 1739) STG: Pt will manage bowels with assistance: 3-Moderate assistance   Problem: SCI BLADDER ELIMINATION Goal: RH STG MANAGE BLADDER WITH ASSISTANCE Description STG Manage Bladder With total Assistance  Outcome: Progressing Flowsheets (Taken 03/11/2018 1739) STG: Pt will manage bladder with assistance: 1-Total assistance Goal: RH STG MANAGE BLADDER WITH MEDICATION WITH ASSISTANCE Description STG Manage Bladder With Medication With min  Assistance.  Outcome: Progressing Flowsheets (Taken 03/11/2018 1739) STG: Pt will manage bladder with medication with assistance: 1-Total assistance Goal: RH STG MANAGE BLADDER WITH EQUIPMENT WITH ASSISTANCE Description STG Manage Bladder With Equipment With mod Assistance  Outcome: Progressing Flowsheets (Taken 03/11/2018 1739) STG: Pt will manage bladder with equipment with assistance: 1-Total assistance Goal: RH STG SCI MANAGE BLADDER PROGRAM W/ASSISTANCE Description Patient and wife will verbalize management of bladder program prior to discharge.    Outcome: Progressing Flowsheets (Taken 03/11/2018 1739) STG: SCI Pt will manage bladder with assistance or as appropriate: 1-Total assistance   Problem: RH SKIN INTEGRITY Goal: RH STG SKIN FREE OF INFECTION/BREAKDOWN Description Patient and wife will verbalize understanding of management of skin breakdown  Outcome: Progressing Goal: RH STG MAINTAIN SKIN  INTEGRITY WITH ASSISTANCE Description STG Maintain Skin Integrity With min  Assistance.  Outcome: Progressing Flowsheets (Taken 03/11/2018 1739) STG: Maintain skin integrity with assistance: 4-Minimal assistance Goal: RH STG ABLE TO PERFORM INCISION/WOUND CARE W/ASSISTANCE Description STG Able To Perform Incision/Wound Care With  BJ'sMin Assistance.  Outcome: Progressing Flowsheets (Taken 03/11/2018 1739) STG: Pt will be able to perform incision/wound care with assistance: 4-Minimal assistance   Problem: RH SAFETY Goal: RH STG ADHERE TO SAFETY PRECAUTIONS W/ASSISTANCE/DEVICE Description STG Adhere to Safety Precautions With min Assistance/Device.  Outcome: Progressing Flowsheets (Taken 03/11/2018 1739) STG:Pt will adhere to safety precautions with assistance/device: 3-Moderate assistance Goal: RH STG DECREASED RISK OF FALL WITH ASSISTANCE Description STG Decreased Risk of Fall With min Assistance.  Outcome: Progressing Flowsheets (Taken 03/11/2018 1739) EPP:[id number]:Decreased risk of fall  with assistance/device: 3-Moderate assistance   Problem: RH PAIN MANAGEMENT Goal: RH STG PAIN MANAGED AT OR BELOW PT'S PAIN GOAL Description Less than 2  Outcome: Progressing   Problem: RH KNOWLEDGE DEFICIT SCI Goal: RH STG INCREASE KNOWLEDGE OF SELF CARE AFTER SCI Description Patient and wife will increase awareness of how to manage self care after SCI  Outcome: Progressing

## 2018-03-11 NOTE — Progress Notes (Signed)
Speech Language Pathology Discharge Summary  Patient Details  Name: Wayne Buckley MRN: [id number] Date of Birth: 1951-05-08  Today's Date: 03/12/2018 SLP Individual Time:  - therapy on 03/11/2018     Skilled Therapeutic Interventions:   Skilled ST services focused on swallow, cognitive skills and family eductaion. Pt's wife present for session. SLP facilitated PO consumption of dys 3 and thin liquids via straw, pt demonstrated no overt s/s aspiration, however refused to slow rate and clear oral cavity prior to next bite. SLP facilitated semi-complex problem solving, working memory and selective attention utilizing daily math problems , pt required min-supervision A verbal cues. Pt's wife stated pt is at cognitive, swallow and speech baseline piror to fall, pt stated agreement. SLP provided education of increased assistance and 24/hour supervision, pt's wife stated she will provided this supervision.  All education completed and questioned answered. Pt was left in room with call bell within reach and bed alarm set. No further ST services at this time.    Patient has met 7 of 4 long term goals.  Patient to discharge at overall Supervision;Min level.  Reasons goals not met: Wife and pt support at baseline   Clinical Impression/Discharge Summary:   Pt demonstrated progress meeting 4 out 7 goals, min A-mod I, although adequeate for discharge, supported by pt and pt's wife confirming baseline from pervious TBI. Pt continues to demonstrated reduced intelligibility at sentence level 80% intelligibility, and cues to clear oral cavity to reduce risk of aspiration, however pt is often noncompliant to cuing. Pt requires min-supervision A verbal cues for use of recall strategies, error and anticipatory awareness and semi-complex problem solving, however skilled SLP intervention is no longer necessary due to pt being at cognitive, swallow and speech baseline per pt and pt's wife report. SLP provided education and all  questions were answered to satisfaction. No further ST services at this time.  Care Partner:  Caregiver Able to Provide Assistance: Yes  Type of Caregiver Assistance: Physical;Cognitive  Recommendation:  None(Wife and pt support at baseline)      Equipment: N/A   Reasons for discharge: Other (comment)(Wife and pt support at baseline)   Patient/Family Agrees with Progress Made and Goals Achieved: Yes(wife)   Function:  Eating Eating   Modified Consistency Diet: Yes Eating Assist Level: Supervision or verbal cues           Cognition Comprehension Comprehension assist level: Understands basic 75 - 89% of the time/ requires cueing 10 - 24% of the time  Expression   Expression assist level: Expresses basic 50 - 74% of the time/requires cueing 25 - 49% of the time. Needs to repeat parts of sentences.  Social Interaction Social Interaction assist level: Interacts appropriately 75 - 89% of the time - Needs redirection for appropriate language or to initiate interaction.  Problem Solving Problem solving assist level: Solves basic 75 - 89% of the time/requires cueing 10 - 24% of the time  Memory Memory assist level: Recognizes or recalls 50 - 74% of the time/requires cueing 25 - 49% of the time   [name]  [facility] 03/12/2018, 12:22 PM

## 2018-03-11 NOTE — Progress Notes (Signed)
Occupational Therapy Session Note  Patient Details  Name: Wayne Buckley MRN: [id number] Date of Birth: 10/13/1950  Today's Date: 03/11/2018 OT Individual Time: 1001-1056 OT Individual Time Calculation (min): 55 min   Short Term Goals: Week 1:  OT Short Term Goal 1 (Week 1): Pt will perform toilet transfer with max A. OT Short Term Goal 2 (Week 1): Pt will perform UB dressing with mod A.  OT Short Term Goal 3 (Week 1): Pt will engaged in 15 minutes of functional task with 1 rest break or less.   Skilled Therapeutic Interventions/Progress Updates:    Pt greeted in bed with no c/o pain. Wanting to eat breakfast. Started session with sitting balance EOB while self feeding. Pt with ataxic UE movements however able to meet task demands with min assist for opening containers and packages. 3 posterior LOBs with pt able to initiate correction, requiring Mod A for balance recovery. Afterwards he returned to bed to proceed with bathing tasks. Worked on core strengthening by having pt pull himself up with B bedrails and maintaining long sitting position while bathing UB. He required Min-Mod A to do so with unilateral support on bedrails. While semi reclined and LEs placed in figure 4 position, pt able to wash Lt foot and leg. Increased assist required for Rt LE, however pt able to wash thigh to lower calf with encouragement. Rolling Rt>Lt completed with Mod A and instruction/facilitation for LE placement while completing perihygiene and LB dressing. Pt able to complete 3/4 components of donning over head shirt while semi reclined, then able to pull himself up with rails while OT assisted with lowering shirt over trunk. Pt exhibited the ability to boost himself up unassisted in bed with max cuing and bed in trendelenberg position, however when fatigued, requests for increased assist. At end of session pt was left in bed with all needs within reach and bed alarm set.   Therapy Documentation Precautions:   Precautions Precautions: Fall, Cervical Restrictions Weight Bearing Restrictions: No Pain: He c/o muscle "soreness" Per pt, "cause I've been working them." Pain Assessment Pain Scale: 0-10 Pain Score: 0-No pain ADL:      See Function Navigator for Current Functional Status.   Therapy/Group: Individual Therapy  Kyree Fedorko A Aryelle Figg 03/11/2018, 12:16 PM

## 2018-03-12 ENCOUNTER — Inpatient Hospital Stay (HOSPITAL_COMMUNITY): Payer: Medicare Other | Admitting: Physical Therapy

## 2018-03-12 ENCOUNTER — Ambulatory Visit (HOSPITAL_COMMUNITY): Payer: Medicare Other

## 2018-03-12 ENCOUNTER — Inpatient Hospital Stay (HOSPITAL_COMMUNITY): Payer: Medicare Other | Admitting: Occupational Therapy

## 2018-03-12 LAB — CBC
HCT: 36.5 % — ABNORMAL LOW (ref 39.0–52.0)
Hemoglobin: 11.9 g/dL — ABNORMAL LOW (ref 13.0–17.0)
MCH: 30 pg (ref 26.0–34.0)
MCHC: 32.6 g/dL (ref 30.0–36.0)
MCV: 91.9 fL (ref 78.0–100.0)
Platelets: 411 10*3/uL — ABNORMAL HIGH (ref 150–400)
RBC: 3.97 MIL/uL — ABNORMAL LOW (ref 4.22–5.81)
RDW: 13.5 % (ref 11.5–15.5)
WBC: 17.9 10*3/uL — ABNORMAL HIGH (ref 4.0–10.5)

## 2018-03-12 NOTE — Progress Notes (Addendum)
Physical Therapy Session Note  Patient Details  Name: Wayne Buckley MRN: [id number] Date of Birth: 22-Jul-1950  Today's Date: 03/12/2018 PT Individual Time: 0910-1000 AND 1400-1435 PT Individual Time Calculation (min): 50 min   AND 35 min and Today's Date: 03/12/2018 PT Missed Time: 10 Minutes AND 10 min Missed Time Reason: Nursing care AND fatigue/unwilling to participate  Short Term Goals: Week 1:  PT Short Term Goal 1 (Week 1): Pt will demonstrate bed mobility with mod assist using bed features. PT Short Term Goal 2 (Week 1): Pt will transition sit<>stand with +2 assist and without power lift equipment.  PT Short Term Goal 3 (Week 1): Pt will propel w/c 25' with min assist.   Skilled Therapeutic Interventions/Progress Updates:   Session 1:  Missed 10 min of skilled PT 2/2 nursing care. Pt in supine and agreeable to therapy, denies pain. Transferred to EOB w/ min assist and worked on static sitting balance and postural control strategies at EOB while eating breakfast. Close supervision for sitting balance w/ intermittent verbal cues to appropriately utilizing UEs for assistance. Transferred to w/c via slide board w/ max-total assist x1; verbal, tactile, and manual cues for technique, anterior weight shifting, and for head/hips relationship. Max assist to scoot back into chair all the way. Total assist w/c transport to gym, worked on sit<>stands outside of parallel bar. Performed 3 reps, max assist x2 to boost w/ manual and tactile cues for increased LLE extension at quad and glut musculature. Pt w/ ataxic movements in static standing w/ BUE support on rail, able to tolerate for 10-15 sec before returning to sit. C/o dizziness in standing that resolves w/ returning to sitting, BP 123/72. Returned to room via w/c, pt self-propelled >200' w/ brief rest break every 15-20', supervision utilizing [place]. Ended session in w/c, call bell in reach and all needs met.   Session 2:  Pt in w/c and agreeable to  therapy, denies pain. Session focused on activity tolerance and LE strengthening. Pt self-propelled w/c to/from therapy gym w/ BUEs and multiple rest breaks as detailed above. Pt unable to push through to reach a visual target, states he needs frequent rest breaks but does not explain why. Performed kinetron @ 50 cm/sec, BLEs w/ manual assist at LLE to facilitate more reciprocal movement pattern. Tactile cues for neutral LE alignment. Could conistently perform task in 20 sec increments, needed rest breaks frequently. Performed 20 sec x10 times in total. Returned to room via w/c, attempted to have pt perform seated LE exercises in w/c, however continued to perform 1-2 reps and state "I'm tired, I need to rest" and refused to perform exercises correctly despite manual assist. Ended session in w/c, call bell in reach and all needs met.   Therapy Documentation Precautions:  Precautions Precautions: Fall, Cervical Restrictions Weight Bearing Restrictions: No  See Function Navigator for Current Functional Status.   Therapy/Group: Individual Therapy  Clance Baquero K Daje Stark 03/12/2018, 10:00 AM

## 2018-03-12 NOTE — Progress Notes (Signed)
[place] PHYSICAL MEDICINE & REHABILITATION     PROGRESS NOTE    Subjective/Complaints: In good spirits this morning. Waiting for breakfast. Slept well.   ROS: Patient denies fever, rash, sore throat, blurred vision, nausea, vomiting, diarrhea, cough, shortness of breath or chest pain, joint or back pain, headache, or mood change.   Objective:   No results found. Recent Labs    03/11/18 0633 03/12/18 0637  WBC 19.0* 17.9*  HGB 11.9* 11.9*  HCT 35.7* 36.5*  PLT 581* 411*   No results for input(s): NA, K, CL, GLUCOSE, BUN, CREATININE, CALCIUM in the last 72 hours.  Invalid input(s): CO CBG (last 3)  No results for input(s): GLUCAP in the last 72 hours.  Wt Readings from Last 3 Encounters:  03/02/18 99.8 kg  08/07/12 88.2 kg  06/24/12 84.6 kg     Intake/Output Summary (Last 24 hours) at 03/12/2018 0838 Last data filed at 03/12/2018 0358 Gross per 24 hour  Intake 960 ml  Output 3000 ml  Net -2040 ml    Vital Signs: Blood pressure 129/77, pulse 72, temperature 98 F (36.7 C), temperature source Oral, resp. rate 18, height 5\' 11"  (1.803 m), SpO2 97 %. Physical Exam:  Constitutional: No distress . Vital signs reviewed. HEENT: EOMI, oral membranes moist Neck: supple Cardiovascular: RRR without murmur. No JVD    Respiratory: CTA Bilaterally without wheezes or rales. Normal effort    GI: BS +, non-tender, non-distended  Left lower extremity with 1 to 2+ edema. stable Neurological: He isalertand oriented to person, place, and time.speech dysarthric speech Biceps and deltoids are 5 out of 5. triceps and HI remain 3/5. Right lower extremity grossly 4 out of 5 proximal distal. LLE: 3- to 4-/5.Marland Kitchen   Sensory function sl decreased to light touch below the level of his injury.  Patient has ataxia bilateral upper and lower limbs Skin: Skin iswarm.  Psychiatric: pleasant.     Assessment/Plan: 1. 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 Physiatrist is providing close team supervision and 24 hour management of active medical problems listed below. Physiatrist and rehab team continue to assess barriers to discharge/monitor patient progress toward functional and medical goals.  Function:  Bathing Bathing position Bathing activity did not occur: Refused Position: Other (comment)(Long sitting in bed for UB and bedlevel for LB)  Bathing parts Body parts bathed by patient: Right arm, Left arm, Chest, Abdomen, Right upper leg, Left upper leg Body parts bathed by helper: Front perineal area, Buttocks, Right lower leg, Left lower leg, Back  Bathing assist Assist Level: (Mod A)      Upper Body Dressing/Undressing Upper body dressing Upper body dressing/undressing activity did not occur: Refused What is the patient wearing?: Pull over shirt/dress     Pull over shirt/dress - Perfomed by patient: Thread/unthread right sleeve, Thread/unthread left sleeve, Put head through opening Pull over shirt/dress - Perfomed by helper: Pull shirt over trunk        Upper body assist Assist Level: Touching or steadying assistance(Pt > 75%)      Lower Body Dressing/Undressing Lower body dressing Lower body dressing/undressing activity did not occur: Refused What is the patient wearing?: Pants, Non-skid slipper socks       Pants- Performed by helper: Thread/unthread right pants leg, Thread/unthread left pants leg, Pull pants up/down   Non-skid slipper socks- Performed by helper: Don/doff right sock, Don/doff left sock  TED Hose - Performed by helper: Don/doff right TED hose, Don/doff left TED hose  Lower body assist Assist for lower body dressing: (Total A)      Toileting Toileting     Toileting steps completed by helper: Adjust clothing after toileting    Toileting assist Assist level: Two helpers    Transfers Chair/bed transfer   Chair/bed transfer method: Lateral scoot Chair/bed transfer assist level: 2 helpers Chair/bed transfer assistive device: Sliding board, Armrests Mechanical lift: Landscape architecttedy   Locomotion Ambulation Ambulation activity did not occur: Safety/medical Investment banker, operationalconcerns         Wheelchair Wheelchair activity did not occur: Safety/medical concerns Type: Manual Max wheelchair distance: 150 ft Assist Level: Supervision or verbal cues  Cognition Comprehension Comprehension assist level: Understands basic 75 - 89% of the time/ requires cueing 10 - 24% of the time  Expression Expression assist level: Expresses basic 50 - 74% of the time/requires cueing 25 - 49% of the time. Needs to repeat parts of sentences.  Social Interaction Social Interaction assist level: Interacts appropriately 75 - 89% of the time - Needs redirection for appropriate language or to initiate interaction.  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 DVT Prophylaxis/Anticoagulation: Extensive left lower extremity DVT status post IVC filter 02/27/2018.  no obvious bleeding on Eliquis hemo globin down from 13.6-12.5--11.9  9/23  -continue to follow cbc 3. Pain Management:Tylenol as needed 4. Mood:Provide emotional support 5. Neuropsych: This patientiscapable of making decisions on hisown behalf. 6. Skin/Wound Care:Routine skin checks 7. Fluids/Electrolytes/Nutrition:  -encourage PO  8.Leukocytosis. Steroid-induced, ?reactive due to DVT.   -Urine study negative    9.Hypertension. HCTZ 25 mg  daily, Coreg 12.5mg  twice daily, Norvasc 10 mg daily,hydralazine 100 mg every 8 hours.   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 Currently volumes too high   -no spontaneous voids yet     -consider urecholine trial if he has sense of emptying 11.neurogenic bowel: -PM bowel program 12.History of alcohol use. Provide counseling  LOS (Days) 7 A FACE TO FACE EVALUATION WAS PERFORMED  Ranelle OysterZachary T Euretha Najarro, MD 03/12/2018 8:38 AM

## 2018-03-12 NOTE — Progress Notes (Signed)
Physical Therapy Session Note  Patient Details  Name: Wayne Buckley MRN: [id number] Date of Birth: 1951-04-28  Today's Date: 03/12/2018 PT Individual Time: 1130-1200 PT Individual Time Calculation (min): 30 min   Short Term Goals: Week 1:  PT Short Term Goal 1 (Week 1): Pt will demonstrate bed mobility with mod assist using bed features. PT Short Term Goal 2 (Week 1): Pt will transition sit<>stand with +2 assist and without power lift equipment.  PT Short Term Goal 3 (Week 1): Pt will propel w/c 25' with min assist.   Skilled Therapeutic Interventions/Progress Updates:    no c/o pain.  Pt continues to require max encouragement to participate in therapy tasks.  Session focus on activity tolerance with w/c propulsion and standing frame.    Pt completes 2 trials in standing frame of about 2-3 minutes each.  Tactile cues for forward weight shift to don/doff sling, but mod assist to maintain forward weight shift when scooting back in chair at completion of activity.  In standing, pt requires max encouragement to continue standing, requesting a rest break after 20 seconds.  Pt engaged in meaningful conversation and tic-tac-toe game during standing.  W/C propulsion from dayroom back to pt's room with 7-8 rest breaks along the way.  Pt encouraged to propel father distances with short goals (40-50') set by therapist, and pt ceasing propulsion after 20' max and refusing to push any further without a rest break.  Pt returned to room at end of session and positioned upright in w/c with chair alarm intact with call bell in reach and needs met.   Therapy Documentation Precautions:  Precautions Precautions: Fall, Cervical Restrictions Weight Bearing Restrictions: No General: PT Amount of Missed Time (min): 10 Minutes PT Missed Treatment Reason: Nursing care   See Function Navigator for Current Functional Status.   Therapy/Group: Individual Therapy  [name] 03/12/2018, 12:14 PM

## 2018-03-12 NOTE — Progress Notes (Signed)
Occupational Therapy Session Note  Patient Details  Name: Wayne Buckley MRN: [id number] Date of Birth: 05/26/1951  Today's Date: 03/12/2018 OT Individual Time: 1002-1044 and 1500-1600 OT Individual Time Calculation (min): 42 min and 60 min    Short Term Goals: Week 1:  OT Short Term Goal 1 (Week 1): Pt will perform toilet transfer with max A. OT Short Term Goal 2 (Week 1): Pt will perform UB dressing with mod A.  OT Short Term Goal 3 (Week 1): Pt will engaged in 15 minutes of functional task with 1 rest break or less.   Skilled Therapeutic Interventions/Progress Updates:    Session 1: Upon entering the room, pt seated in wheelchair and encouraged to change soiled shirt. Pt with donning pull over shirt with set up A. Pt propelled self to sink with encouragement for grooming tasks with overall set up A to put toothpaste on brush. Pt needing mod multimodal cuing for sequencing and initiation this session. Pt propelled wheelchair 6560' with 2 rest breaks needed secondary to pt reported exertion. Pt listening to music group and singing lyrics while tapping hands to beat of music for coordination task. Pt returning back to room in same manner. Chair alarm activated and call bell with all needed items within reach.   Session 2: Upon entering the room, pt seated in wheelchair awaiting OT arrival. OT assisted pt via wheelchair to dayroom. Maxi move utilized to transfer onto tilt table. BP taken with pt in supine of 101/74 and pt asymptomatic. Pt tilted to 65 degrees with BP results of 120/78 and after 7 minutes dropped to 105/76. While tilted at 65 degrees pt performed 7  Mini squats with maximal encouragement. After each rep patient stating, " I need to rest now." OT provided education to pt regarding participation, motivation, and self limiting behavior. He responded with, " I just need to rest in order to heal." Pt returned to wheelchair via maxi move and assisted back to room. OT utilized maxi to return pt  to bed as well secondary to time management and NT requesting pt return to bed for schedule I & O cath. Call bell and all needed items within reach upon exiting the room.   Therapy Documentation Precautions:  Precautions Precautions: Fall, Cervical Restrictions Weight Bearing Restrictions: No General: General PT Missed Treatment Reason: Nursing care  See Function Navigator for Current Functional Status.   Therapy/Group: Individual Therapy  Alen BleacherBradsher, Chloe Baig P 03/12/2018, 12:57 PM

## 2018-03-13 ENCOUNTER — Inpatient Hospital Stay (HOSPITAL_COMMUNITY): Payer: Medicare Other

## 2018-03-13 ENCOUNTER — Inpatient Hospital Stay (HOSPITAL_COMMUNITY): Payer: Medicare Other | Admitting: Occupational Therapy

## 2018-03-13 ENCOUNTER — Encounter (HOSPITAL_COMMUNITY): Payer: Medicare Other | Admitting: Psychology

## 2018-03-13 ENCOUNTER — Inpatient Hospital Stay (HOSPITAL_COMMUNITY): Payer: Medicare Other | Admitting: Physical Therapy

## 2018-03-13 DIAGNOSIS — G9520 Unspecified cord compression: Secondary | ICD-10-CM

## 2018-03-13 LAB — CBC
HEMATOCRIT: 35.5 % — AB (ref 39.0–52.0)
HEMOGLOBIN: 11.6 g/dL — AB (ref 13.0–17.0)
MCH: 29.9 pg (ref 26.0–34.0)
MCHC: 32.7 g/dL (ref 30.0–36.0)
MCV: 91.5 fL (ref 78.0–100.0)
Platelets: 565 10*3/uL — ABNORMAL HIGH (ref 150–400)
RBC: 3.88 MIL/uL — AB (ref 4.22–5.81)
RDW: 13.6 % (ref 11.5–15.5)
WBC: 16.4 10*3/uL — ABNORMAL HIGH (ref 4.0–10.5)

## 2018-03-13 NOTE — Consult Note (Signed)
Neuropsychological Consultation   Patient:   Wayne Buckley   DOB:   1950-10-26  MR Number:  [id number]  Location:  [facility] [address] CENTER A [address] [id number] [place] [address] Dept: [phone] Loc: [phone]           Date of Service:   03/13/2018  Start Time:   2 PM End Time:   3 PM  Provider/Observer:  Arley Phenix, Psy.D.       Clinical Neuropsychologist       Billing Code/Service: 32355 4 Units  Chief Complaint:    Wayne Buckley is a 67 year old right-handed male who has a prior history of alcohol use/abuse, hypertension, TBI with subdural hematoma 6 years ago with craniotomy and receiving comprehensive inpatient rehabilitation services in 2013.  In the 2013 event the patient had a left frontal lobe IPH, left frontal subdural hematoma and left subarachnoid hematoma.  There was a left to right midline shift noted.  The patient had initial neuropsychological testing during his hospital course that suggested significant impairments with regard to attention and concentration and significant deficits with memory storage and retrieval.  While over the next year and a half or so the patient showed significant improvement there did continue to be some residual neurocognitive deficits but they had improved.  As of 2014 the patient is wife report that he had returned in many facets of functioning to "nearly back to normal quite honestly."  He was spending a lot of time at home alone with no real limitations noted.  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.  The patient presented on 02/25/2018 after a fall with noted lower extremity weakness.  Cranial CT scan showed no acute changes.  Imaging of his cervical and lumbar spine revealed severe cord compression C6-7.  The patient underwent ACDF and fusion  on 02/28/2018.  The patient is continued to motor deficits and was referred for the comprehensive inpatient rehabilitation program.  During his treatment stay here the patient has had difficulty initiating and engaging in therapeutic interventions to the degree hope for.  Long-standing personality issues and possibly issues related to residual effects of his TBI (left frontal TBI) may be playing a role with this.  The patient denies any significant depression or anxiety but does have a history of family reporting that it is difficult for him to be motivated to engage actively in various therapeutic her life activities and he will often report that he is taking it easy so he can continue "healing."  Reason for Service:  Due to issues and concerns about coping and adjustment issues the patient was referred for psychological and neuropsychological consultation.  Below is the HPI for the current admission.  Wayne Buckley is a 67 year old right-handed male with history of alcohol use, hypertension, TBI/SDH 6 years ago craniotomy received inpatient rehab services November 2013. Per chart review he lives with his spouse. Reportedly independent had refused assistive devices. He was driving short distances. 2 level home with bedroom on [address]. Presented 02/25/2018 after a fall x2 with noted lower extremity weakness. Cranial CT scan showed no acute changes. Noted chronic encephalomalacia. Remote left frontal craniotomy as well as remote right occipital skull fracture. Noted vascular studies lower extremities positive for acute DVT left common femoral vein, femoral vein, profunda vein, popliteal vein peroneal vein posterior tibial  veins gastrocnemius vein. Left external iliac vein also thrombosed. Underwent placement of IVC filter 02/27/2018 per Dr. Myra Gianotti. 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 Underwent ACDF and fusion 02/28/2018 per Dr. Wynetta Emery. Hospital course pain management. Decadron  protocol as indicated. Noted leukocytosis secondary to steroids. Intravenous heparin initiated for extensive left lower extremity DVT postoperatively 9/16/201910 plan to begin Eliquis 03/08/2018. Therapy evaluations completed with recommendations of physical medicine rehab consult. Patient was admitted for a comprehensive rehab program.  Current Status:  The patient spent a great deal of time office skating and avoiding the direct discussions around his level of motivation and activity throughout the therapeutic process.  The patient denied any significant anxiety or depression.  There was clearly residual expressive language deficits that are not related to his current cervical injury but are likely related to his prior TBI.  While the patient had been doing well with his recovery and was independent prior to this fall there is very likely to have been continued residual effects from his TBI in 2013.  Behavioral Observation: Wayne Buckley  presents as a 67 y.o.-year-old Right Caucasian Male who appeared his stated age. his dress was Appropriate and he was Well Groomed and his manners were Appropriate to the situation.  his participation was indicative of Appropriate and Redirectable behaviors.  There were any physical disabilities noted.  he displayed a reduced level of cooperation and motivation.     Interactions:    Active but  Resistant to active efforts to review and discuss concerns over level of effort.  Attention:   abnormal and attention span appeared shorter than expected for age  Memory:   abnormal; recent memory intact, remote memory impaired  Visuo-spatial:  not examined  Speech (Volume):  low  Speech:   slurred; garbled  Thought Process:  Coherent and Relevant  Though Content:  WNL; not suicidal and not homicidal  Orientation:   person, place, time/date and  situation  Judgment:   Poor  Planning:   Fair  Affect:    Flat  Mood:    Dysphoric  Insight:   Fair  Intelligence:   high  Substance Use:  There is a documented history of alcohol abuse confirmed by the patient and family members.    Education:   Control and instrumentation engineer History:   Past Medical History:  Diagnosis Date  . Hypertension   . TBI (traumatic brain injury) [facility]) 2013            Abuse/Trauma History: The patient was involved in a significant TBI/injury in 2013 where he suffered a subdural hematoma and left frontal lobe injuries.  Psychiatric History:  The patient denies any prior psychiatric history other than significant history of alcohol abuse which was associated with his occupational history.  Family Med/Psych History:  Family History  Problem Relation Age of Onset  . Heart disease Father     Risk of Suicide/Violence: low the patient denies any suicidal or homicidal ideation.  Impression/DX:  Genene Churn. Lolli is a 67 year old right-handed male who has a prior history of alcohol use/abuse, hypertension, TBI with subdural hematoma 6 years ago with craniotomy and receiving comprehensive inpatient rehabilitation services in 2013.  In the 2013 event the patient had a left frontal lobe IPH, left frontal subdural hematoma and left subarachnoid hematoma.  There was a left to right midline shift noted.  The patient had initial neuropsychological testing during his hospital course that suggested significant impairments with regard to attention and concentration and significant  deficits with memory storage and retrieval.  While over the next year and a half or so the patient showed significant improvement there did continue to be some residual neurocognitive deficits but they had improved.  As of 2014 the patient is wife report that he had returned in many facets of functioning to "nearly back to normal quite honestly."  He was spending a lot of time at home alone with no real  limitations noted.  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.  The patient presented on 02/25/2018 after a fall with noted lower extremity weakness.  Cranial CT scan showed no acute changes.  Imaging of his cervical and lumbar spine revealed severe cord compression C6-7.  The patient underwent ACDF and fusion on 02/28/2018.  The patient is continued to motor deficits and was referred for the comprehensive inpatient rehabilitation program.  During his treatment stay here the patient has had difficulty initiating and engaging in therapeutic interventions to the degree hope for.  Long-standing personality issues and possibly issues related to residual effects of his TBI (left frontal TBI) may be playing a role with this.  The patient denies any significant depression or anxiety but does have a history of family reporting that it is difficult for him to be motivated to engage actively in various therapeutic her life activities and he will often report that he is taking it easy so he can continue "healing."  The patient spent a great deal of time office skating and avoiding the direct discussions around his level of motivation and activity throughout the therapeutic process.  The patient denied any significant anxiety or depression.  There was clearly residual expressive language deficits that are not related to his current cervical injury but are likely related to his prior TBI.  While the patient had been doing well with his recovery and was independent prior to this fall there is very likely to have been continued residual effects from his TBI in 2013.  Disposition/Plan:  While the patient has had difficulty being motivated and fully engaging in all of the therapeutic efforts review of information including information from family members as well as past medical history do suggest that this  personality style has been around for some time and may have even preexisted his 2013 TBI.  However, the nature of his injury in 2013 to his left frontal lobe may very much play a role in his difficulties initiating and engaging in behaviors and activities.  Even though this may have a neurological factor to his behavioral pattern it will be imperative to keep the patient active and his therapeutic interventions not only during the inpatient rehab program but also going forward.  The patient has significant residual motor deficits after his spinal cord injury.  The level of care that he needs at this point and likely after discharge from the inpatient unit may be more than his wife is able to provide individually for the patient.  It is imperative that he improve his overall functional status related to his spinal cord injury.  This is very likely to become an increasing issue after discharge from the inpatient unit.  Diagnosis:    Initiation and motivation issues, may be due to prior TBI in 2013.          Electronically Signed   _______________________ Arley Phenix, Psy.D.

## 2018-03-13 NOTE — Progress Notes (Signed)
Occupational Therapy Session Note  Patient Details  Name: Wayne Buckley MRN: [id number] Date of Birth: [birth year]  Today's Date: 03/13/2018 OT Individual Time: 1500-1530 OT Individual Time Calculation (min): 30 min    Short Term Goals: Week 1:  OT Short Term Goal 1 (Week 1): Pt will perform toilet transfer with max A. OT Short Term Goal 2 (Week 1): Pt will perform UB dressing with mod A.  OT Short Term Goal 3 (Week 1): Pt will engaged in 15 minutes of functional task with 1 rest break or less.   Skilled Therapeutic Interventions/Progress Updates:    Pt worked on wheelchair mobility to start session from his room approximately 90' before needing a rest break.  Therapist helped propel him the rest of the way.  Once in the gym, he completed sliding board transfer to the mat with total assist +2 (pt 30%).  Pt completed scooting up the mat with total assist for small scoots.  Pt demonstrates choreoathetoid movements in his trunk and UEs.  Completed sit to stand with use of the Stedy and total assist +2 (pt 25%) for 2 intervals.  He only maintained standing for 10 seconds and then would sit down abruptly secondary to stating he was dizzy.  BP taken in sitting at 113/65.  Finished session with PT in to take over for further therapy.    Therapy Documentation Precautions:  Precautions Precautions: Fall, Cervical Precaution Comments: No brace Restrictions Weight Bearing Restrictions: No  Pain: Pain Assessment Pain Scale: Faces Pain Score: 0-No pain ADL: See Function Navigator for Current Functional Status.   Therapy/Group: Individual Therapy  Amahd Morino,[place] OTR/L 03/13/2018, 3:50 PM

## 2018-03-13 NOTE — Progress Notes (Signed)
Physical Therapy Session Note  Patient Details  Name: Wayne Buckley MRN: [id number] Date of Birth: 12/17/1950  Today's Date: 03/13/2018 PT Individual Time: 1530-1610 PT Individual Time Calculation (min): 40 min   Short Term Goals: Week 1:  PT Short Term Goal 1 (Week 1): Pt will demonstrate bed mobility with mod assist using bed features. PT Short Term Goal 2 (Week 1): Pt will transition sit<>stand with +2 assist and without power lift equipment.  PT Short Term Goal 3 (Week 1): Pt will propel w/c 25' with min assist.   Skilled Therapeutic Interventions/Progress Updates:    Pt received in therapy gym from OT, agreeable to therapy tx and denies pain. Pt performed sit<>stand within stedy with max assist +2, verbal/tactile cues for upright posture and hip extension. Pt reports some dizziness, BP 116/63. Pt performed sit<>stand within stedy max assist +2. From stedy seat attempted to continue working on standing however pt unable to come up to full extension despite total assist. Pt seated edge of mat worked on dynamic sitting balance while tossing ball back and forth, min guard assist from therapist. Pt transferred sit>sidelying on elbow for slideboard placement. Pt performed slideboard transfer from mat>w/c with mod assist +2, improved ability to push through LEs and arms. Pt propelled w/c back to room with supervision and increased time to complete secondary to multiple rest breaks. Pt transferred from w/c>bed with slideboard and mod assist +2. Pt transferred to supine with max assist +2. Pt left supine in bed with needs in reach and chair alarm set.   Therapy Documentation Precautions:  Precautions Precautions: Fall, Cervical Restrictions Weight Bearing Restrictions: No   See Function Navigator for Current Functional Status.   Therapy/Group: Individual Therapy  Cresenciano Genre, PT, DPT 03/13/2018, 8:02 AM

## 2018-03-13 NOTE — Progress Notes (Signed)
[facility]     PROGRESS NOTE    Subjective/Complaints: Just waking up. No new complaints. Ready for breakfast  ROS: Patient denies fever, rash, sore throat, blurred vision, nausea, vomiting, diarrhea, cough, shortness of breath or chest pain, joint or back pain, headache, or mood change.   Objective:   No results found. Recent Labs    03/12/18 0637 03/13/18 0533  WBC 17.9* 16.4*  HGB 11.9* 11.6*  HCT 36.5* 35.5*  PLT 411* 565*   No results for input(s): NA, K, CL, GLUCOSE, BUN, CREATININE, CALCIUM in the last 72 hours.  Invalid input(s): CO CBG (last 3)  No results for input(s): GLUCAP in the last 72 hours.  Wt Readings from Last 3 Encounters:  03/13/18 100 kg  03/02/18 99.8 kg  08/07/12 88.2 kg     Intake/Output Summary (Last 24 hours) at 03/13/2018 0826 Last data filed at 03/13/2018 0440 Gross per 24 hour  Intake 240 ml  Output 1552 ml  Net -1312 ml    Vital Signs: Blood pressure (!) 174/80, pulse 74, temperature 98 F (36.7 C), temperature source Oral, resp. rate 16, height 5\' 11"  (1.803 m), weight 100 kg, SpO2 97 %. Physical Exam:  Constitutional: No distress . Vital signs reviewed. HEENT: EOMI, oral membranes moist Neck: supple Cardiovascular: RRR without murmur. No JVD    Respiratory: CTA Bilaterally without wheezes or rales. Normal effort    GI: BS +, non-tender, non-distended  Left lower extremity with 1 to 2+ edema. stable Neurological: He isalertand oriented to person, place, and time.speech dysarthric speech Biceps and deltoids are 5 out of 5. triceps and HI remain 3/5. Right lower extremity grossly 4 out of 5 proximal distal. LLE: 3- to 4-/5.Marland Kitchen   Sensory function sl decreased to light touch below the level of his injury.  Skin: Skin iswarm.  Psychiatric: pleasant.     Assessment/Plan: 1. 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 Physiatrist is providing close team supervision and 24 hour management of active medical problems listed below. Physiatrist and rehab team continue to assess barriers to discharge/monitor patient progress toward functional and medical goals.  Function:  Bathing Bathing position Bathing activity did not occur: Refused Position: Other (comment)(Long sitting in bed for UB and bedlevel for LB)  Bathing parts Body parts bathed by patient: Right arm, Left arm, Chest, Abdomen, Right upper leg, Left upper leg Body parts bathed by helper: Front perineal area, Buttocks, Right lower leg, Left lower leg, Back  Bathing assist Assist Level: (Mod A)      Upper Body Dressing/Undressing Upper body dressing Upper body dressing/undressing activity did not occur: Refused What is the patient wearing?: Pull over shirt/dress     Pull over shirt/dress - Perfomed by patient: Thread/unthread right sleeve, Thread/unthread left sleeve, Put head through opening Pull over shirt/dress - Perfomed by helper: Pull shirt over trunk        Upper body assist Assist Level: Touching or steadying assistance(Pt > 75%)      Lower Body Dressing/Undressing Lower body dressing Lower body dressing/undressing activity did not occur: Refused What is the patient wearing?: Pants, Non-skid slipper socks       Pants- Performed by helper: Thread/unthread right pants leg, Thread/unthread left pants leg, Pull pants up/down   Non-skid slipper socks- Performed by helper: Don/doff right sock, Don/doff left sock  TED Hose - Performed by helper: Don/doff right TED hose, Don/doff left TED hose  Lower body assist Assist for lower body dressing: (Total A)      Toileting Toileting     Toileting steps completed by helper: Adjust clothing after toileting    Toileting assist Assist level: Two helpers   Transfers Chair/bed transfer   Chair/bed  transfer method: Lateral scoot Chair/bed transfer assist level: Maximal assist (Pt 25 - 49%/lift and lower) Chair/bed transfer assistive device: Sliding board, Armrests Mechanical lift: Stedy   Locomotion Ambulation Ambulation activity did not occur: Safety/medical Investment banker, operational activity did not occur: Safety/medical concerns Type: Manual Max wheelchair distance: 20 Assist Level: Supervision or verbal cues  Cognition Comprehension Comprehension assist level: Understands basic 75 - 89% of the time/ requires cueing 10 - 24% of the time  Expression Expression assist level: Expresses basic 50 - 74% of the time/requires cueing 25 - 49% of the time. Needs to repeat parts of sentences.  Social Interaction Social Interaction assist level: Interacts appropriately 75 - 89% of the time - Needs redirection for appropriate language or to initiate interaction.  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 He states that he will work harder. Observe for performance 2. DVT Prophylaxis/Anticoagulation: Extensive left lower extremity DVT status post IVC filter 02/27/2018.  no obvious bleeding on Eliquis hemo globin down from 13.6-12.5--11.9  9/23---11.6 9/25  -continue to follow cbcs 3. Pain Management:Tylenol as needed 4. Mood:Provide emotional support 5. Neuropsych: This patientiscapable of making decisions on hisown behalf. 6. Skin/Wound Care:Routine skin checks 7. Fluids/Electrolytes/Nutrition:  -encourage PO   8.Leukocytosis. Steroid-induced, ?reactive due to DVT.   -Urine study negative    9.Hypertension. HCTZ 25 mg daily, Coreg 12.5mg  twice daily, Norvasc 10 mg daily,hydralazine 100 mg every 8 hours.   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 Provide counseling  LOS (Days) 8 A FACE TO FACE EVALUATION WAS PERFORMED  Ranelle Oyster, MD 03/13/2018 8:26 AM

## 2018-03-13 NOTE — Progress Notes (Signed)
Occupational Therapy Session Note  Patient Details  Name: Wayne Buckley MRN: [id number] Date of Birth: [birth year]  Today's Date: 03/13/2018 OT Individual Time: 1045-1200 OT Individual Time Calculation (min): 75 min    Short Term Goals: Week 1:  OT Short Term Goal 1 (Week 1): Pt will perform toilet transfer with max A. OT Short Term Goal 2 (Week 1): Pt will perform UB dressing with mod A.  OT Short Term Goal 3 (Week 1): Pt will engaged in 15 minutes of functional task with 1 rest break or less.   Skilled Therapeutic Interventions/Progress Updates:    Pt received in bed and agreeable to a bath and changing clothing. He stated he had just had his brief changed.  Pt worked on long sitting with mod A to flex forward at hips. Pt is heavily reliant on pulling up on bed rails.  Quite a bit of time spent having pt work with using his torso and pushing up with his arms.  Pt needs max cues and continually cues as he demonstrated motor impersistance and a constant movement pattern of grabbing the handrails.  Handrails removed so pt could not use them and this therapist stood on one side of the bed, rehab tech on other side.   Pt worked on using hands to push old shorts down and off feet with max A and then new pants on with max A often needing hand over hand guiding to maintain grasp.   Pt placed in supine, worked on control  B manual hand hold reaches to chest level and with controlled descent to chest. Pt then worked on rolling side to side with knees flexed with A to support knee flexion, and forced use of trunk muscles to rotate torso.  He rolled onto L side with mod A without rails and then to sit with max A.  Able to maintain static sit with min A.  Pt transferred to w/c with stedy (+2 for safety) with mod A.  In w/c pt demonstrated improved UE coordination and motor control with bathing and dressing and grooming all with set up.  Pt in w/c with belt alarm on and all needs met. Call light in  reach.  Therapy Documentation Precautions:  Precautions Precautions: Fall, Cervical Restrictions Weight Bearing Restrictions: No    Vital Signs: Therapy Vitals Pulse Rate: 70 BP: 124/77 Pain: Pain Assessment Pain Scale: 0-10 Pain Score: 0-No pain ADL:   See Function Navigator for Current Functional Status.   Therapy/Group: Individual Therapy  [place] 03/13/2018, 12:12 PM

## 2018-03-13 NOTE — Progress Notes (Signed)
Physical Therapy Session Note  Patient Details  Name: Wayne Buckley MRN: [id number] Date of Birth: 10/31/1950  Today's Date: 03/13/2018 PT Individual Time: 1300-1400 PT Individual Time Calculation (min): 60 min   Short Term Goals: Week 1:  PT Short Term Goal 1 (Week 1): Pt will demonstrate bed mobility with mod assist using bed features. PT Short Term Goal 2 (Week 1): Pt will transition sit<>stand with +2 assist and without power lift equipment.  PT Short Term Goal 3 (Week 1): Pt will propel w/c 25' with min assist.   Skilled Therapeutic Interventions/Progress Updates:    no c/o pain.  Session focus on activity tolerance and NMR for weight bearing and motor control with sit<>stand transfers with stedy.    Pt taken to therapy gym total assist for energy conservation.  Repeated sit<>stand throughout session from w/c and from elevated stedy seat focus on forward weight shift, power up, and supported standing tolerance in stedy.  Pt requires total +2 to stand from w/c, and max +2 to stand from elevated stedy seat.  Max multimodal cues throughout for weight shifting, upright posture, and activation of LEs.  Pt requires MAX encouragement from therapist and wife (present for second half of session) to participate.  Frequently stating that he needs to rest and that he is "recovering from surgery." Pt completes total of 4 full stands throughout session, as many as 10 partial stands with patient giving up prior to coming to full standing.  Pt educated on importance of pushing self in therapy and wife providing encouragement, but little effect noted.  Pt returned to room in w/c total assist at end of session, chair alarm intact, call bell in reach and needs met.    Therapy Documentation Precautions:  Precautions Precautions: Fall, Cervical Restrictions Weight Bearing Restrictions: No   See Function Navigator for Current Functional Status.   Therapy/Group: Individual Therapy  [name] 03/13/2018, 3:29 PM

## 2018-03-13 NOTE — Progress Notes (Signed)
Patient refusing Bisacodyl suppository / Bowel program. Patient was educated to the risks and benefits of medication. Patient states he does not want it as he has been moving his bowels fine. Patient educated to the risks associated with constipation, patient continues to refuse medication.

## 2018-03-13 NOTE — Progress Notes (Signed)
Physical Therapy Session Note  Patient Details  Name: Wayne Buckley MRN: [id number] Date of Birth: Oct 20, 1950  Today's Date: 03/13/2018 PT Individual Time: 1015-1045 PT Individual Time Calculation (min): 30 min   Short Term Goals: Week 1:  PT Short Term Goal 1 (Week 1): Pt will demonstrate bed mobility with mod assist using bed features. PT Short Term Goal 2 (Week 1): Pt will transition sit<>stand with +2 assist and without power lift equipment.  PT Short Term Goal 3 (Week 1): Pt will propel w/c 25' with min assist.   Skilled Therapeutic Interventions/Progress Updates:    no c/o pain.  Pt incontinent of bowel on arrival.  Pt completes rolling R and L with mod assist at pelvis for total assist hygiene and clothing management.  Pt educated on purpose of bowel program and completely emptying bowels 1x/day rather than having multiple small stools throughout the day.  Pt verbalized understanding but education to continue throughout LOS.  Also provided pt with handout for home measurements to determine w/c accessibility at home.  Repositioned in bed with call bell in reach and needs met.   Therapy Documentation Precautions:  Precautions Precautions: Fall, Cervical Restrictions Weight Bearing Restrictions: No General:   Vital Signs: Therapy Vitals Pulse Rate: 70 BP: 124/77 Pain: Pain Assessment Pain Scale: 0-10 Pain Score: 0-No pain   See Function Navigator for Current Functional Status.   Therapy/Group: Individual Therapy  [name] 03/13/2018, 11:01 AM

## 2018-03-14 ENCOUNTER — Inpatient Hospital Stay (HOSPITAL_COMMUNITY): Payer: Medicare Other | Admitting: Occupational Therapy

## 2018-03-14 ENCOUNTER — Inpatient Hospital Stay (HOSPITAL_COMMUNITY): Payer: Medicare Other | Admitting: Physical Therapy

## 2018-03-14 LAB — CBC
HEMATOCRIT: 35.4 % — AB (ref 39.0–52.0)
Hemoglobin: 11.8 g/dL — ABNORMAL LOW (ref 13.0–17.0)
MCH: 30.1 pg (ref 26.0–34.0)
MCHC: 33.3 g/dL (ref 30.0–36.0)
MCV: 90.3 fL (ref 78.0–100.0)
Platelets: 578 10*3/uL — ABNORMAL HIGH (ref 150–400)
RBC: 3.92 MIL/uL — ABNORMAL LOW (ref 4.22–5.81)
RDW: 13.7 % (ref 11.5–15.5)
WBC: 15.9 10*3/uL — ABNORMAL HIGH (ref 4.0–10.5)

## 2018-03-14 MED ORDER — DEXAMETHASONE 0.5 MG PO TABS
0.5000 mg | ORAL_TABLET | Freq: Two times a day (BID) | ORAL | Status: DC
  Administered 2018-03-14 – 2018-03-18 (×8): 0.5 mg via ORAL
  Filled 2018-03-14 (×8): qty 1

## 2018-03-14 MED ORDER — ACETAMINOPHEN 325 MG PO TABS
650.0000 mg | ORAL_TABLET | Freq: Four times a day (QID) | ORAL | Status: DC | PRN
  Administered 2018-03-14 – 2018-04-04 (×13): 650 mg via ORAL
  Filled 2018-03-14 (×14): qty 2

## 2018-03-14 MED ORDER — ACETAMINOPHEN 325 MG PO TABS: 650.0000 mg | ORAL_TABLET | Freq: Four times a day (QID) | ORAL | Status: DC | PRN

## 2018-03-14 NOTE — Progress Notes (Signed)
Social Work Patient ID: Kirkland Hun, male   DOB: October 06, 1950, 67 y.o.   MRN: [id number]   Have reviewed team conference with pt and spouse.  Homero Fellers discussion about team concerns of self-limiting behavior of pt and stressing to him that he needs to take full advantage of program being offered.  He states that he feels he is doing "pretty good" and only c/o is that his therapy is "too early" sometimes.  Tx aware of pt's request for mid-morning start to tx day and are making all attempts to accommodate this.  Wife presents her concerns to pt about his care needs and the need for him to "work as hard as you can here" in order to reach an assistance level that she can manage at home.   Wife concerned that pt having some discomfort in back, neck and DVT location but that he "would never ask for anything." Pt does note discomfort so he is agreeable with tylenol and Kpad if MD in agreement - alerted PA as well.   Will continue to follow and monitor participation.    Jomarie Gellis, LCSW

## 2018-03-14 NOTE — Progress Notes (Signed)
Physical Therapy Session Note  Patient Details  Name: Wayne Buckley MRN: [id number] Date of Birth: 1951/05/06  Today's Date: 03/14/2018 PT Individual Time: 1445-1535 PT Individual Time Calculation (min): 50 min   Short Term Goals: Week 2:  PT Short Term Goal 1 (Week 2): Pt will transfer with slide board and +1 assist PT Short Term Goal 2 (Week 2): Pt will propel w/c 73' with supervision PT Short Term Goal 3 (Week 2): Pt will tolerate supported standing x5 minutes for weight bearing and activity tolerance  Skilled Therapeutic Interventions/Progress Updates:    no c/o pain.  Session attempt to focus on LE strengthening and NMR for postural control with sit<>stands and supported standing in stedy.  Pt requires TOTAL assist of 2 to come to standing in stedy x2 trials with no attempt/ability to keep trunk elevated.  In supported sitting in stedy pt completes 2x6 trials of clothespin task focus on standing tolerance, trunk control, and UE control during functional task.  Pt again frequently requesting rest breaks and whenever asked to complete a task states he's dizzy.  All vitals WNL.  PT attempted to engage pt in meaningful conversation regarding his goals for rehab and his perception of progress towards said goals, versus actual progress with therapy.  Pt only states "get better" when asked what his goal was and does not elaborate despite attempts to provide examples for more specific tasks he wants to improve with.  When PT provided education on current slow progress towards standing/ambulatory goals, pt states "I think I'm doing pretty well, I'm just recovering." Pt refusing to attempt standing any more trials.  Returned to room and attempted slide board transfer back to bed, however pt unable/unwilling to maintain weight shift for board placement despite total +2 assist.  Maximove back to bed for safety of patient and therapists.  Pt positioned in chair position for upright tolerance, call bell in reach  and needs met.  MIssed 10 minutes of therapy due to refusal.   Therapy Documentation Precautions:  Precautions Precautions: Fall, Cervical Precaution Comments: No brace Restrictions Weight Bearing Restrictions: No General: PT Amount of Missed Time (min): 10 Minutes PT Missed Treatment Reason: Patient unwilling to participate   See Function Navigator for Current Functional Status.   Therapy/Group: Individual Therapy  [name] 03/14/2018, 4:36 PM

## 2018-03-14 NOTE — Patient Care Conference (Signed)
Inpatient RehabilitationTeam Conference and Plan of Care Update Date: 03/12/2018   Time: 2:20 PM    Patient Name: Wayne Buckley      Medical Record Number: [id number]  Date of Birth: [birth year] Sex: Male         Room/Bed: 4W02C/4W02C-01 Payor Info: Payor: BLUE CROSS BLUE [employer]IELD / Plan: [employer]BS OTHER / Product Type: *No Product type* /    Admitting Diagnosis: ACDF  Admit Date/Time:  03/05/2018  2:54 PM Admission Comments: No comment available   Primary Diagnosis:  <principal problem not specified> Principal Problem: <principal problem not specified>  Patient Active Problem List   Diagnosis Date Noted  . Cord compression (HCC) 03/05/2018  . C5-C7 level spinal cord injury (HCC)   . Central cord syndrome (HCC)   . Neurogenic bladder   . Essential hypertension   . AKI (acute kidney injury) (HCC) 02/26/2018  . Bilateral leg weakness 02/26/2018  . Leg DVT (deep venous thromboembolism), acute, left (HCC) 02/26/2018  . Fall 02/25/2018  . Alcohol abuse 05/04/2012  . Hypokalemia 05/04/2012  . Urinary retention 05/04/2012  . Subdural hematoma, post-traumatic (HCC) 04/28/2012    Expected Discharge Date: Expected Discharge Date: 04/02/18  Team Members Present: Physician leading conference: Dr. Faith Rogue Social Worker Present: [name], LCSW Nurse Present: Adora Fridge, RN PT Present: Teodoro Kil, PT OT Present: Callie Fielding, OT SLP Present: Colin Benton, SLP PPS Coordinator present : Tora Duck, RN, CRRN     Current Status/Progress Goal Weekly Team Focus  Medical   cervical myelopathy cord contusion after fall, hx of TBI, disinhibited with decreased safety judgement  improve bowel and bladder emptying  pain control, nutrition, education   Bowel/Bladder   continent of bowel and bladder; unable to urinate I&O cath q 4H; LBM 03/10/18  Remain continent of bowel   assess bowel and bladder needs q shift and PRN; toilet PRN    Swallow/Nutrition/ Hydration   dys 3 and thi,  supervision   mod I - discharged wife supports baseline       ADL's   2 helpers for slideboard transfers, Max A-2 helpers for LB self care, Min A UB self care. Very self limiting and needs max encouragement to participate   Min A overall   Active therapy participation, UE/LE coordination, balance, functional transfers, endurance, strengthening, pt/family education    Mobility   +2 for transfers, mod/max for bed mobility, needs maximum encouragement to participate  min assist overall  balance, w/c propulsion, transfers, participation   Communication   min-supervision A  Mod I -discharged wife supports baseline       Safety/Cognition/ Behavioral Observations  Min A - supervision A  Min A -discharged wife supports baseline       Pain   no pain issues   pt will verbalize if in pain and needs will be address PRN    pt will remain free from pain    Skin   Blister to left big toe, cervical incision front of neck steri strips in place (surgery)   remain free of breakdown and infection   assess skin q shift and PRN     Rehab Goals Patient on target to meet rehab goals: Yes *See Care Plan and progress notes for long and short-term goals.     Barriers to Discharge  Current Status/Progress Possible Resolutions Date Resolved   Physician    Medical stability;Neurogenic Bowel & Bladder        ongoing education,  Nursing                  PT                    OT Behavior  self limiting and often refusing therapy participation              SLP                SW                Discharge Planning/Teaching Needs:  Plan for pt to d/c home with wife as primary caregiver, however, she is concerned about the amount of assistance he may need at d/c.  Beginning to involve pt's wife now.   Team Discussion:  Continue to have b/b issues;  Refusing suppository;  req I/o caths.  Baseline cognitive and behavioral issues are interfering in progress.  Very difficult to motivate.  +2 tfs with sl.  Board;  20' w/c propulsion at most.  Self-limitimg behavior.  ST d/c'd per wife report that he is at baseline cognitive fxn.  Goals at min assist w/c level overall.    Revisions to Treatment Plan:  NA at this time.    Continued Need for Acute Rehabilitation Level of Care: The patient requires daily medical management by a physician with specialized training in physical medicine and rehabilitation for the following conditions: Daily direction of a multidisciplinary physical rehabilitation program to ensure safe treatment while eliciting the highest outcome that is of practical value to the patient.: Yes Daily medical management of patient stability for increased activity during participation in an intensive rehabilitation regime.: Yes Daily analysis of laboratory values and/or radiology reports with any subsequent need for medication adjustment of medical intervention for : Neurological problems;Post surgical problems   I attest that I was present, lead the team conference, and concur with the assessment and plan of the team.   Joseantonio Dittmar 03/14/2018, 1:19 PM

## 2018-03-14 NOTE — Progress Notes (Signed)
Physical Therapy Weekly Progress Note  Patient Details  Name: Wayne Buckley MRN: [id number] Date of Birth: 1950/09/28  Beginning of progress report period: March 06, 2018 End of progress report period: March 14, 2018  Today's Date: 03/14/2018 PT Individual Time: 5701-7793 PT Individual Time Calculation (min): 45 min   Patient has met 1 of 3 short term goals.  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  He continues to require significant assist (up to +2) for functional mobility.  Patient continues to demonstrate the following deficits muscle weakness and muscle paralysis, decreased cardiorespiratoy endurance, impaired timing and sequencing, abnormal tone, unbalanced muscle activation, ataxia and decreased coordination, decreased initiation, decreased attention, decreased awareness, decreased problem solving, decreased safety awareness, decreased memory and delayed processing and decreased sitting balance, decreased standing balance, decreased postural control and decreased balance strategies and therefore will continue to benefit from skilled PT intervention to increase functional independence with mobility.  Patient progressing toward long term goals..  Continue plan of care.  PT Short Term Goals Week 1:  PT Short Term Goal 1 (Week 1): Pt will demonstrate bed mobility with mod assist using bed features. PT Short Term Goal 1 - Progress (Week 1): Progressing toward goal PT Short Term Goal 2 (Week 1): Pt will transition sit<>stand with +2 assist and without power lift equipment.  PT Short Term Goal 2 - Progress (Week 1): Met PT Short Term Goal 3 (Week 1): Pt will propel w/c 25' with min assist.  PT Short Term Goal 3 - Progress (Week 1): Progressing toward goal Week 2:  PT Short Term Goal 1 (Week 2): Pt will transfer with slide board and +1 assist PT Short Term Goal 2 (Week  2): Pt will propel w/c 29' with supervision PT Short Term Goal 3 (Week 2): Pt will tolerate supported standing x5 minutes for weight bearing and activity tolerance  Skilled Therapeutic Interventions/Progress Updates:    no c/o pain.  Session focus on functional mobility and activity tolerance.  Pt reporting having just having his brief changed "within two hours" prior to PT arrival and that he was clean, but noted to be incontinent of stool that appeared to have been there for some time.  Rolling L/R with bed rails and mod assist at pelvis for PT to provide total assist for hygiene and clothing management.  Pt is able to maintain side lying better today than in previous sessions.  Supine>sit with mod assist to elevate trunk and max encouragement for pt to manage LEs over EOB.  Slide board transfers throughout session to R and L, pt requires +2 to position board, and max assist for lateral scoot.  He requires +2 to scoot back in w/c due to impulsivity and posterior lean.  Pt propelled w/c from therapy gym back to room with >10 rest breaks along the way.  Pt continues to require max multimodal cues for safety and weight shifting throughout session, and continues to request frequent rest breaks throughout entire session requiring increased encouragement to continue.  End of session pt positioned upright in w/c with call bell in reach and needs met.  Wife present at bedside.   Therapy Documentation Precautions:  Precautions Precautions: Fall, Cervical Precaution Comments: No brace Restrictions Weight Bearing Restrictions: No  See Function Navigator for Current Functional Status.  Therapy/Group: Individual Therapy  [name] 03/14/2018, 11:23 AM

## 2018-03-14 NOTE — Progress Notes (Signed)
Occupational Therapy Weekly Progress Note  Patient Details  Name: Wayne Buckley MRN: [id number] Date of Birth: February 18, 1951  Beginning of progress report period: March 06, 2018 End of progress report period: March 14, 2018  Today's Date: 03/14/2018 OT Individual Time: 0930-1000 and 1301-1358 OT Individual Time Calculation (min): 30 min and 57 min    Patient has met 1 of 3 short term goals.  Pt making limited progress towards occupational therapy goals this week. Barriers have included decreased motivation and learned helplessness this session. Pt has verbalized feelings of "dizziness" with every therapeutic activity and BP remains WFLs throughout session. Caregivers have been present during session to provide encouragement and motivation. Pt requires +2 assistance for safety with functional transfer. LB bathing and dressing completed from bed level with max - total A of 2. UB dressing performed at sink with min A to pull over trunk. Pt continues to required education for participation each session.   Patient continues to demonstrate the following deficits: muscle weakness, decreased cardiorespiratoy endurance, decreased attention, decreased awareness, decreased problem solving, decreased safety awareness and delayed processing and decreased sitting balance, decreased standing balance, decreased postural control and decreased balance strategies and therefore will continue to benefit from skilled OT intervention to enhance overall performance with BADL and Reduce care partner burden.  Patient progressing toward long term goals..  Continue plan of care.  OT Short Term Goals Week 1:  OT Short Term Goal 1 (Week 1): Pt will perform toilet transfer with max A. OT Short Term Goal 1 - Progress (Week 1): Not met OT Short Term Goal 2 (Week 1): Pt will perform UB dressing with mod A.  OT Short Term Goal 2 - Progress (Week 1): Met OT Short Term Goal 3 (Week 1): Pt will engaged in 15 minutes of  functional task with 1 rest break or less.  OT Short Term Goal 3 - Progress (Week 1): Not met Week 2:  OT Short Term Goal 1 (Week 2): Pt will perform toilet transfer with assist of 1. OT Short Term Goal 2 (Week 2): Pt will perform LB dressing with mod A from bed level with modifications as needed. OT Short Term Goal 3 (Week 2): Pt will demonstrate long sitting with min A from bed.  Skilled Therapeutic Interventions/Progress Updates:    Session 1: Upon entering the room, pt supine in bed with no c/o pain this session. Skilled OT intervention with focus on long sitting and self care retraining. Pt agreeable to donning LB clothing. Pt required max A to come into long sitting with use of bed rails. Pt needing maximal encouragement and increased time (9 minutes) to thread pants onto B feet. Pt then pulling pants up LEs with encouragement. Rolling L <> R with mod A and use of bed rails while therapist pulled pants over B hips. OT discussing pt's goals and asking pt to make choice of which goal he would like to address in afternoon session in order to take ownership. Pt requesting to perform self care/grooming tasks at sink. Plans made for afternoon session. Pt remained in bed with call bell within reach and bed alarm activated.   Session 2: Upon entering the room, wife present to provide encouragement to pt for participation. Pt seated in wheelchair and propelled wheelchair to sink with close supervision. OT assisted pt with thoroughness to wash hair at sink but pt able to scrub hair and brush with increased curing. Pt shaving sections of face but secondary to decreased coordination and  safety OT partially assisted as well. Pt bathing self at sink with set up A. Pull over shirt with min A to pull down trunk. Caregiver present to observe pt's progress and discuss pt's lack of participation. OT also began discharge planning based on home measurement sheet. Pt will likely need hospital bed at discharge, wheelchair  ramp, and will not be able to get into bathroom with wheelchair. Education to continue. Pt remained in wheelchair with alarm belt donned and call bell within reach.   Therapy Documentation Precautions:  Precautions Precautions: Fall, Cervical Precaution Comments: No brace Restrictions Weight Bearing Restrictions: No  See Function Navigator for Current Functional Status.   Therapy/Group: Individual Therapy  Gypsy Decant 03/14/2018, 12:50 PM

## 2018-03-14 NOTE — Progress Notes (Signed)
[place] PHYSICAL MEDICINE & REHABILITATION     PROGRESS NOTE    Subjective/Complaints: Up in bed. Says he slept well. Thought that he did "better" in therapy yesterday.   ROS: Patient denies fever, rash, sore throat, blurred vision, nausea, vomiting, diarrhea, cough, shortness of breath or chest pain, joint or back pain, headache, or mood change.   Objective:   No results found. Recent Labs    03/13/18 0533 03/14/18 0647  WBC 16.4* 15.9*  HGB 11.6* 11.8*  HCT 35.5* 35.4*  PLT 565* 578*   No results for input(s): NA, K, CL, GLUCOSE, BUN, CREATININE, CALCIUM in the last 72 hours.  Invalid input(s): CO CBG (last 3)  No results for input(s): GLUCAP in the last 72 hours.  Wt Readings from Last 3 Encounters:  03/13/18 100 kg  03/02/18 99.8 kg  08/07/12 88.2 kg     Intake/Output Summary (Last 24 hours) at 03/14/2018 1048 Last data filed at 03/14/2018 0920 Gross per 24 hour  Intake 477 ml  Output 1675 ml  Net -1198 ml    Vital Signs: Blood pressure 134/85, pulse 79, temperature 98 F (36.7 C), resp. rate (!) 98, height 5\' 11"  (1.803 m), weight 100 kg, SpO2 97 %. Physical Exam:  Constitutional: No distress . Vital signs reviewed. HEENT: EOMI, oral membranes moist Neck: supple Cardiovascular: RRR without murmur. No JVD    Respiratory: CTA Bilaterally without wheezes or rales. Normal effort    GI: BS +, non-tender, non-distended  Left lower extremity with 1  + edema.   Neurological: He isalertand oriented to person, place, and time.speech dysarthric speech Biceps and deltoids are 5 out of 5. triceps and HI remain 3 to 3+/5. Right lower extremity grossly 4 out of 5 proximal distal. LLE: 3- to 4-/5.Marland Kitchen   Sensory function remains decreased to light touch below the level of his injury.  Skin: Skin iswarm.  Psychiatric: cooperative    Assessment/Plan: 1. 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 Physiatrist is providing close team supervision and 24 hour management of active medical problems listed below. Physiatrist and rehab team continue to assess barriers to discharge/monitor patient progress toward functional and medical goals.  Function:  Bathing Bathing position Bathing activity did not occur: Refused Position: Bed(LB, w/c UB)  Bathing parts Body parts bathed by patient: Right arm, Left arm, Chest, Abdomen, Right upper leg, Left upper leg Body parts bathed by helper: Right lower leg, Left lower leg, Back  Bathing assist Assist Level: (Mod A)      Upper Body Dressing/Undressing Upper body dressing Upper body dressing/undressing activity did not occur: Refused What is the patient wearing?: Pull over shirt/dress     Pull over shirt/dress - Perfomed by patient: Thread/unthread right sleeve, Thread/unthread left sleeve, Put head through opening, Pull shirt over trunk Pull over shirt/dress - Perfomed by helper: Pull shirt over trunk        Upper body assist Assist Level: Touching or steadying assistance(Pt > 75%)      Lower Body Dressing/Undressing Lower body dressing Lower body dressing/undressing activity did not occur: Refused What is the patient wearing?: Pants, Ted Hose, Non-skid slipper socks       Pants- Performed by helper: Thread/unthread right pants leg, Thread/unthread left pants leg, Pull pants up/down(pt A with pulling up pants 50%)   Non-skid slipper socks- Performed by helper: Don/doff right sock, Don/doff left sock  TED Hose - Performed by helper: Don/doff right TED hose, Don/doff left TED hose  Lower body assist Assist for lower body dressing: (Total A)      Toileting Toileting     Toileting steps completed by helper: Adjust clothing after toileting    Toileting assist Assist level: Two helpers   Transfers Chair/bed transfer   Chair/bed transfer method:  Lateral scoot Chair/bed transfer assist level: 2 helpers(mod A, 2 helpers for safety) Chair/bed transfer assistive device: Sliding board Mechanical lift: Landscape architect Ambulation activity did not occur: Safety/medical Investment banker, operational activity did not occur: Safety/medical concerns Type: Manual Max wheelchair distance: 20 Assist Level: Supervision or verbal cues  Cognition Comprehension Comprehension assist level: Understands basic 75 - 89% of the time/ requires cueing 10 - 24% of the time  Expression Expression assist level: Expresses basic 50 - 74% of the time/requires cueing 25 - 49% of the time. Needs to repeat parts of sentences.  Social Interaction Social Interaction assist level: Interacts appropriately 75 - 89% of the time - Needs redirection for appropriate language or to initiate interaction.  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 DVT Prophylaxis/Anticoagulation: Extensive left lower extremity DVT status post IVC filter 02/27/2018.  no obvious bleeding on Eliquis  -hgb11.9  9/23---11.6 9/25---11.8 9/26  -dc daily cbc's 3. Pain Management:Tylenol as needed 4. Mood:Provide emotional support 5. Neuropsych: This patientiscapable of making decisions on hisown behalf. 6. Skin/Wound Care:Routine skin checks 7. Fluids/Electrolytes/Nutrition:  -encourage PO  8.Leukocytosis. Steroid-induced, ?reactive due to DVT.   -Urine study negative    9.Hypertension. HCTZ 25 mg daily, Coreg  12.5mg  twice daily, Norvasc 10 mg daily,hydralazine 100 mg every 8 hours.   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 Provide counseling  LOS (Days) 9 A FACE TO FACE EVALUATION WAS PERFORMED  Ranelle Oyster, MD 03/14/2018 10:48 AM

## 2018-03-15 ENCOUNTER — Inpatient Hospital Stay (HOSPITAL_COMMUNITY): Payer: Medicare Other | Admitting: Physical Therapy

## 2018-03-15 ENCOUNTER — Inpatient Hospital Stay (HOSPITAL_COMMUNITY): Payer: Medicare Other | Admitting: Occupational Therapy

## 2018-03-15 NOTE — Progress Notes (Signed)
Physical Therapy Session Note  Patient Details  Name: Wayne Buckley MRN: [id number] Date of Birth: [birth year]  Today's Date: 03/15/2018 PT Individual Time: 1400-1500 PT Individual Time Calculation (min): 60 min   Short Term Goals: Week 2:  PT Short Term Goal 1 (Week 2): Pt will transfer with slide board and +1 assist PT Short Term Goal 2 (Week 2): Pt will propel w/c 40' with supervision PT Short Term Goal 3 (Week 2): Pt will tolerate supported standing x5 minutes for weight bearing and activity tolerance  Skilled Therapeutic Interventions/Progress Updates:    no c/o pain.  Pt continues to c/o dizziness during session when asked to perform a task, attempted to assess BP but pt moving UEs so much that BP cuff wouldn't read.  Session focus on sitting balance, forward weight shifting, and scooting edge of mat for carry over into slide board transfers.    Pt continues to require +2 assist for slide board transfers with decreased forward weight shift and decreased ability to maintain head/hips relationship or hand placement on board.  Sitting edge of mat pt completes 2 trials to fatigue of ball toss for trunk control and sitting balance with min guard<>close supervision throughout activity.  Seated forward reaching task 3x5 reps with min assist for LUE positioning on mat and for balance at pt's shoulder.  Pt frequently stating he cannot complete this forward reaching task, despite being fairly successful with it on many trials, requiring max encouragement that he could in fact complete the task.  Scooting L/R on mat with max multimodal cues for sequencing and technique.  Pt returned to w/c at end of session with slide board in same manner as above and returned to room, left upright with chair alarm intact, call bell in reach and needs met.   Therapy Documentation Precautions:  Precautions Precautions: Fall, Cervical Precaution Comments: No brace Restrictions Weight Bearing Restrictions: No   See  Function Navigator for Current Functional Status.   Therapy/Group: Individual Therapy  [name] 03/15/2018, 4:21 PM

## 2018-03-15 NOTE — Progress Notes (Signed)
Physical Therapy Session Note  Patient Details  Name: Wayne Buckley MRN: [id number] Date of Birth: 05/02/1951  Today's Date: 03/15/2018 PT Individual Time: 1300-1325 PT Individual Time Calculation (min): 25 min   Short Term Goals: Week 2:  PT Short Term Goal 1 (Week 2): Pt will transfer with slide board and +1 assist PT Short Term Goal 2 (Week 2): Pt will propel w/c 64' with supervision PT Short Term Goal 3 (Week 2): Pt will tolerate supported standing x5 minutes for weight bearing and activity tolerance  Skilled Therapeutic Interventions/Progress Updates:   Pt in w/c and agreeable to therapy, no c/o pain. Session focused on endurance and global strengthening. Pt self-propelled w/c to/from therapy gym w/ supervision using BUEs and decreased rest breaks than in previous sessions with this therapist. Wife present during session, this seemed to motivate pt more so to participate and push himself. Performed kinetron @ 50 cm/sec for LE strengthening, 10 reps x 10 sets. Manual and tactile cues needed first half of sets for LLE, pt able to perform w/o manual assist last half of sets. He appeared to do better, behaviorally,  if reps were counted to 10, he would often count with this therapist. Returned to room via w/c, ended session in w/c. All needs in reach.   Therapy Documentation Precautions:  Precautions Precautions: Fall, Cervical Precaution Comments: No brace Restrictions Weight Bearing Restrictions: No Pain: Pain Assessment Pain Score: 0-No pain  See Function Navigator for Current Functional Status.   Therapy/Group: Individual Therapy  Jacole Capley Melton Krebs 03/15/2018, 1:26 PM

## 2018-03-15 NOTE — Progress Notes (Signed)
Occupational Therapy Session Note  Patient Details  Name: Wayne Buckley MRN: [id number] Date of Birth: January 01, 1951  Today's Date: 03/15/2018 OT Individual Time: 9892-1194 OT Individual Time Calculation (min): 74 min    Short Term Goals: Week 1:  OT Short Term Goal 1 (Week 1): Pt will perform toilet transfer with max A. OT Short Term Goal 1 - Progress (Week 1): Not met OT Short Term Goal 2 (Week 1): Pt will perform UB dressing with mod A.  OT Short Term Goal 2 - Progress (Week 1): Met OT Short Term Goal 3 (Week 1): Pt will engaged in 15 minutes of functional task with 1 rest break or less.  OT Short Term Goal 3 - Progress (Week 1): Not met Week 2:  OT Short Term Goal 1 (Week 2): Pt will perform toilet transfer with assist of 1. OT Short Term Goal 2 (Week 2): Pt will perform LB dressing with mod A from bed level with modifications as needed. OT Short Term Goal 3 (Week 2): Pt will demonstrate long sitting with min A from bed.  Skilled Therapeutic Interventions/Progress Updates:    Pt seen this session with +2 A from rehab tech to focus on trunk control and LE strength.  Pt's wife present also.  Pt received in w/c with his hips slid forward.  Spent quite a bit of time working on pt actively using his hands/arms to push up on arm rests to then lean forward and slide hips back. Pt was eventually able to do this with max A.  Pt only wanted to wash UB. Set up at the sink and worked on bathing with constant cues to thoroughly was each body part.   He wanted to shave with electric razor.  Even with upper arm braced against his torso, his coordination was not stable enough to allow him to shave safely.  Worked with pt on using his torso strength to lean forward over the sink versus pulling on the sink.   To change pants pt needed to use Stedy lift.  Multiple efforts to have pt maintain grasp on bar and not to lean over bar and to use his legs to push up  With his legs.   He was able to achieve this with max  A +2. Total A to change pants.  Pt very tired after this. Pt has tendency to lean back in his w/c.  Placed higher back support in chair.  Chair belt alarm on and wife in room with pt.     Therapy Documentation Precautions:  Precautions Precautions: Fall, Cervical Precaution Comments: No brace Restrictions Weight Bearing Restrictions: No    Vital Signs: Therapy Vitals Temp: 99 F (37.2 C) Temp Source: Oral Pulse Rate: 83 Resp: 12 BP: (!) 141/78 Patient Position (if appropriate): Lying Oxygen Therapy SpO2: 98 % Pain: Pain Assessment Pain Scale: 0-10 Pain Score: 0-No pain ADL:     See Function Navigator for Current Functional Status.   Therapy/Group: Individual Therapy  SAGUIER,JULIA 03/15/2018, 9:05 AM

## 2018-03-15 NOTE — Progress Notes (Signed)
Physical Therapy Session Note  Patient Details  Name: Wayne Buckley MRN: [id number] Date of Birth: 1951/06/15  Today's Date: 03/15/2018 PT Individual Time: 0930-1014 PT Individual Time Calculation (min): 44 min   Short Term Goals: Week 1:  PT Short Term Goal 1 (Week 1): Pt will demonstrate bed mobility with mod assist using bed features. PT Short Term Goal 1 - Progress (Week 1): Progressing toward goal PT Short Term Goal 2 (Week 1): Pt will transition sit<>stand with +2 assist and without power lift equipment.  PT Short Term Goal 2 - Progress (Week 1): Met PT Short Term Goal 3 (Week 1): Pt will propel w/c 25' with min assist.  PT Short Term Goal 3 - Progress (Week 1): Progressing toward goal  Skilled Therapeutic Interventions/Progress Updates:    pt in bed agreeable to therapy.  Pt performs supine to sit with mod A with use of bedrails. Sliding board transfer with +2 assist, max A for forward wt shifts, cues for UE and LE placement.  Seated balance with UEs on ball in front of pt with focus on fwd wt shifts with max manual facilitation.  Core mm task with pt holding 2# dowel and tapping different targets with max manual facilitation due to core mm weakness.  Bimanual UE task with using 4# dowel to reach for targets with mod/max A and cues.  Pt requires frequent encouragement throughout session as pt asks to take a rest after 2-3 reps of each exercise. Pt left in w/c iwht needs at hand, alarm set.  Therapy Documentation Precautions:  Precautions Precautions: Fall, Cervical Precaution Comments: No brace Restrictions Weight Bearing Restrictions: No Pain: Pain Assessment Pain Scale: 0-10 Pain Score: 0-No pain   Therapy/Group: Individual Therapy  [place] Hank 03/15/2018, 10:14 AM

## 2018-03-15 NOTE — Progress Notes (Signed)
[facility] PH[facility] MEDICINE & REHABILITATION     PROGRESS NOTE    Subjective/Complaints: Slept ok. Waiting for breakfast.   ROS: Patient denies fever, rash, sore throat, blurred vision, nausea, vomiting, diarrhea, cough, shortness of breath or chest pain, joint or back pain, headache, or mood change.    Objective:   No results found. Recent Labs    03/13/18 0533 03/14/18 0647  WBC 16.4* 15.9*  HGB 11.6* 11.8*  HCT 35.5* 35.4*  PLT 565* 578*   No results for input(s): NA, K, CL, GLUCOSE, BUN, CREATININE, CALCIUM in the last 72 hours.  Invalid input(s): CO CBG (last 3)  No results for input(s): GLUCAP in the last 72 hours.  Wt Readings from Last 3 Encounters:  03/13/18 100 kg  03/02/18 99.8 kg  08/07/12 88.2 kg     Intake/Output Summary (Last 24 hours) at 03/15/2018 0830 Last data filed at 03/15/2018 0513 Gross per 24 hour  Intake 1570 ml  Output 2300 ml  Net -730 ml    Vital Signs: Blood pressure (!) 141/78, pulse 83, temperature 99 F (37.2 C), temperature source Oral, resp. rate 12, height 5\' 11"  (1.803 m), weight 100 kg, SpO2 98 %. Physical Exam:  Constitutional: No distress . Vital signs reviewed. HEENT: EOMI, oral membranes moist Neck: supple Cardiovascular: RRR without murmur. No JVD    Respiratory: CTA Bilaterally without wheezes or rales. Normal effort    GI: BS +, non-tender, non-distended  Left lower extremity with 1  + edema.   Neurological: He isalertand oriented to person, place, and time.speech dysarthric speech Biceps and deltoids are 5 out of 5. triceps and HI remain 3 to 3+/5. Right lower extremity grossly 4 out of 5 proximal distal. LLE: 3- to 4-/5.Marland Kitchen   Sensory function remains decreased to light touch below the level of his injury.  Skin: Skin iswarm.  Psychiatric: pleasant   Assessment/Plan: 1. 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 Physiatrist is providing close team supervision and 24 hour management of active medical problems listed below. Physiatrist and rehab team continue to assess barriers to discharge/monitor patient progress toward functional and medical goals.  Function:  Bathing Bathing position Bathing activity did not occur: Refused Position: Wheelchair/chair at sink  Bathing parts Body parts bathed by patient: Right arm, Left arm, Chest, Abdomen(UB only) Body parts bathed by helper: Right lower leg, Left lower leg, Back  Bathing assist Assist Level: Set up, Supervision or verbal cues      Upper Body Dressing/Undressing Upper body dressing Upper body dressing/undressing activity did not occur: Refused What is the patient wearing?: Pull over shirt/dress     Pull over shirt/dress - Perfomed by patient: Thread/unthread right sleeve, Thread/unthread left sleeve, Put head through opening Pull over shirt/dress - Perfomed by helper: Pull shirt over trunk        Upper body assist Assist Level: Touching or steadying assistance(Pt > 75%)      Lower Body Dressing/Undressing Lower body dressing Lower body dressing/undressing activity did not occur: Refused What is the patient wearing?: Pants, Ted Hose, Non-skid slipper socks       Pants- Performed by helper: Thread/unthread right pants leg, Thread/unthread left pants leg, Pull pants up/down(pt A with pulling up pants 50%)   Non-skid slipper socks- Performed by helper: Don/doff right sock, Don/doff left sock               TED Hose -  Performed by helper: Don/doff right TED hose, Don/doff left TED hose  Lower body assist Assist for lower body dressing: (Total A)      Toileting Toileting     Toileting steps completed by helper: Adjust clothing after toileting    Toileting assist Assist level: Two helpers   Transfers Chair/bed transfer   Chair/bed transfer method: Other Chair/bed transfer assist level: 2  helpers Chair/bed transfer assistive device: Mechanical lift Mechanical lift: Maximove   Locomotion Ambulation Ambulation activity did not occur: Safety/medical Investment banker, operational activity did not occur: Safety/medical concerns Type: Manual Max wheelchair distance: 20 Assist Level: Supervision or verbal cues  Cognition Comprehension Comprehension assist level: Understands basic 75 - 89% of the time/ requires cueing 10 - 24% of the time  Expression Expression assist level: Expresses basic 50 - 74% of the time/requires cueing 25 - 49% of the time. Needs to repeat parts of sentences.  Social Interaction Social Interaction assist level: Interacts appropriately 75 - 89% of the time - Needs redirection for appropriate language or to initiate interaction.  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 DVT Prophylaxis/Anticoagulation: Extensive left lower extremity DVT status post IVC filter 02/27/2018.  no obvious bleeding on Eliquis  -hgb11.9  9/23---11.6 9/25---11.8 9/26    3. Pain Management:Tylenol as needed 4. Mood:Provide emotional support 5. Neuropsych: This patientiscapable of making decisions on hisown behalf. 6. Skin/Wound Care:Routine skin checks 7. Fluids/Electrolytes/Nutrition:  -encourage PO  8.Leukocytosis. Steroid-induced, ?reactive due to DVT.   -Urine study negative    9.Hypertension. HCTZ 25 mg daily, Coreg 12.5mg  twice daily,  Norvasc 10 mg daily,hydralazine 100 mg every 8 hours.   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 Provide counseling  LOS (Days) 10 A FACE TO FACE EVALUATION WAS PERFORMED  Ranelle Oyster, MD 03/15/2018 8:30 AM

## 2018-03-16 ENCOUNTER — Inpatient Hospital Stay (HOSPITAL_COMMUNITY): Payer: Medicare Other | Admitting: Physical Therapy

## 2018-03-16 ENCOUNTER — Inpatient Hospital Stay (HOSPITAL_COMMUNITY): Payer: Medicare Other | Admitting: Occupational Therapy

## 2018-03-16 NOTE — Progress Notes (Signed)
Physical Therapy Session Note  Patient Details  Name: Wayne Buckley MRN: [id number] Date of Birth: [birth year]  Today's Date: 03/16/2018 PT Individual Time: 1105-1200 PT Individual Time Calculation (min): 55 min   Short Term Goals: Week 2:  PT Short Term Goal 1 (Week 2): Pt will transfer with slide board and +1 assist PT Short Term Goal 2 (Week 2): Pt will propel w/c 19' with supervision PT Short Term Goal 3 (Week 2): Pt will tolerate supported standing x5 minutes for weight bearing and activity tolerance  Skilled Therapeutic Interventions/Progress Updates:   Pt received supine in bed and agreeable to PT. RN present to administer medication. Pt noted to have soiled brief. Rolling R and L x 2 each for peri care and clothing management. Supine>sit transfer with noderate assist and max cues for log roll technique./   Sitting balance EOB with UE coordination training.  Trunk rotations to tap ball to target on the L and R. 2 x 10 Bil Shoulder press and Shoulder flexion with 2# bar weight. 2 x 15 Clothes pin placement on rack to force UE fine motor control x8 Bil Scooting to HOB while sitting EOB with max assist progressing to mod assist with cues for improved timing of movements and use of fits to improve leverage through BUE  Pt reports dizziness in sitting after 15 min. Returned to supine with moderate assist for short rest break. Returned to sitting as listed above to compete sitting balance training. PT provided min assist for all sitting balance tasks with intermittent mod assist with sudden L lateral LOB and posterior when scooting  Pt returned to supine with mod-max assist from PT to control BLE. Left supine with call bell left in reach and all needs met.         Therapy Documentation Precautions:  Precautions Precautions: Fall, Cervical Precaution Comments: No brace Restrictions Weight Bearing Restrictions: No    Vital Signs: Therapy Vitals Temp: 98.3 F (36.8 C) Temp  Source: Oral Pulse Rate: 83 Resp: 16 BP: (!) 140/109 Patient Position (if appropriate): Lying Oxygen Therapy SpO2: 98 % O2 Device: Room Air Pain: denies  See Function Navigator for Current Functional Status.   Therapy/Group: Individual Therapy  Lorie Phenix 03/16/2018, 5:13 PM

## 2018-03-16 NOTE — Progress Notes (Signed)
[facility] MEDICINE & REHABILITATION     PROGRESS NOTE    Subjective/Complaints: Had a good night sleep.  Happy that therapy will be starting later this morning.  ROS: Patient denies fever, rash, sore throat, blurred vision, nausea, vomiting, diarrhea, cough, shortness of breath or chest pain, joint or back pain, headache, or mood change.   Objective:   No results found. Recent Labs    03/14/18 0647  WBC 15.9*  HGB 11.8*  HCT 35.4*  PLT 578*   No results for input(s): NA, K, CL, GLUCOSE, BUN, CREATININE, CALCIUM in the last 72 hours.  Invalid input(s): CO CBG (last 3)  No results for input(s): GLUCAP in the last 72 hours.  Wt Readings from Last 3 Encounters:  03/13/18 100 kg  03/02/18 99.8 kg  08/07/12 88.2 kg     Intake/Output Summary (Last 24 hours) at 03/16/2018 0802 Last data filed at 03/16/2018 0500 Gross per 24 hour  Intake 837 ml  Output 1300 ml  Net -463 ml    Vital Signs: Blood pressure 136/90, pulse 83, temperature 98.3 F (36.8 C), temperature source Oral, resp. rate 18, height 5\' 11"  (1.803 m), weight 100 kg, SpO2 96 %. Physical Exam:  Constitutional: No distress . Vital signs reviewed. HEENT: EOMI, oral membranes moist Neck: supple Cardiovascular: RRR without murmur. No JVD    Respiratory: CTA Bilaterally without wheezes or rales. Normal effort    GI: BS +, non-tender, slightly-distended  Left lower extremity with 1  + edema.   Neurological: He isalertand oriented to person, place, and time.speech dysarthric speech Biceps and deltoids are 5 out of 5. triceps and HI remain 3 to 3+/5. Right lower extremity grossly 4 out of 5 proximal distal. LLE: 3- to 4-/5.Marland Kitchen   Sensory function remains decreased to light touch below the level of his injury. --Motor and sensory exam stable Skin: Skin iswarm.  Psychiatric: pleasant   Assessment/Plan: 1. 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 Physiatrist is providing close team supervision and 24 hour management of active medical problems listed below. Physiatrist and rehab team continue to assess barriers to discharge/monitor patient progress toward functional and medical goals.  Function:  Bathing Bathing position Bathing activity did not occur: Refused Position: Wheelchair/chair at sink  Bathing parts Body parts bathed by patient: Right arm, Left arm, Chest, Abdomen(UB only) Body parts bathed by helper: Right lower leg, Left lower leg, Back  Bathing assist Assist Level: Set up, Supervision or verbal cues      Upper Body Dressing/Undressing Upper body dressing Upper body dressing/undressing activity did not occur: Refused What is the patient wearing?: Pull over shirt/dress     Pull over shirt/dress - Perfomed by patient: Thread/unthread right sleeve, Thread/unthread left sleeve, Put head through opening Pull over shirt/dress - Perfomed by helper: Pull shirt over trunk        Upper body assist Assist Level: Touching or steadying assistance(Pt > 75%)      Lower Body Dressing/Undressing Lower body dressing Lower body dressing/undressing activity did not occur: Refused What is the patient wearing?: Pants, Ted Hose, Non-skid slipper socks       Pants- Performed by helper: Thread/unthread right pants leg, Thread/unthread left pants leg, Pull pants up/down(pt A with pulling up pants 50%)   Non-skid slipper socks- Performed by helper: Don/doff right sock, Don/doff left sock  TED Hose - Performed by helper: Don/doff right TED hose, Don/doff left TED hose  Lower body assist Assist for lower body dressing: (Total A)      Toileting Toileting     Toileting steps completed by helper: Adjust clothing after toileting    Toileting assist Assist level: Two helpers   Transfers Chair/bed transfer   Chair/bed transfer method:  Lateral scoot Chair/bed transfer assist level: 2 helpers Chair/bed transfer assistive device: Mechanical lift Mechanical lift: Maximove   Locomotion Ambulation Ambulation activity did not occur: Safety/medical Investment banker, operational activity did not occur: Safety/medical concerns Type: Manual Max wheelchair distance: 20 Assist Level: Supervision or verbal cues  Cognition Comprehension Comprehension assist level: Understands basic 75 - 89% of the time/ requires cueing 10 - 24% of the time  Expression Expression assist level: Expresses basic 50 - 74% of the time/requires cueing 25 - 49% of the time. Needs to repeat parts of sentences.  Social Interaction Social Interaction assist level: Interacts appropriately 75 - 89% of the time - Needs redirection for appropriate language or to initiate interaction.  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 DVT Prophylaxis/Anticoagulation: Extensive left lower extremity DVT status post IVC filter 02/27/2018.  no obvious bleeding on Eliquis  -hgb11.9  9/23---11.6 9/25---11.8 9/26  Recheck Monday 3. Pain Management:Tylenol as needed 4. Mood:Provide emotional support 5. Neuropsych: This patientiscapable of making decisions on hisown behalf. 6. Skin/Wound Care:Routine skin checks 7. Fluids/Electrolytes/Nutrition:  -encourage PO  8.Leukocytosis. Steroid-induced, ?reactive due to DVT.   -Urine study negative    9.Hypertension. HCTZ 25 mg daily, Coreg  12.5mg  twice daily, Norvasc 10 mg daily,hydralazine 100 mg every 8 hours.   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 Provide counseling  LOS (Days) 11 A FACE TO FACE EVALUATION WAS PERFORMED  Ranelle Oyster, MD 03/16/2018 8:02 AM

## 2018-03-16 NOTE — Progress Notes (Signed)
Occupational Therapy Session Note  Patient Details  Name: Wayne Buckley MRN: [id number] Date of Birth: January 02, 1951  Today's Date: 03/16/2018 OT Individual Time: 4098-1191 OT Individual Time Calculation (min): 29 min   Short Term Goals: Week 2:  OT Short Term Goal 1 (Week 2): Pt will perform toilet transfer with assist of 1. OT Short Term Goal 2 (Week 2): Pt will perform LB dressing with mod A from bed level with modifications as needed. OT Short Term Goal 3 (Week 2): Pt will demonstrate long sitting with min A from bed.  Skilled Therapeutic Interventions/Progress Updates:    Pt greeted in bed with no c/o pain. With encouragement, pt agreeable to EOB therapy. Before sitting up, pt able to achieve figure 4 with Rt LE to don Rt gripper sock (HOB elevated). Assisted required for donning Lt gripper sock, however pt able to tolerate figure 4 position with this limb as well. Mod-Max A for supine<sit. Pt then engaged in oral care, shaving (with electric razor), hair brushing, and washing hands with hand sanitizer. Pt required cues to complete these tasks at max level of independence. He required increased time due to ataxia. Afterwards pt returned to supine with Mod-Max A. With bed placed in trendelenburg position, pt boosted himself up in bed with instruction to use bedrails. He was left with all needs within reach at end of tx and bed alarm set.   Therapy Documentation Precautions:  Precautions Precautions: Fall, Cervical Precaution Comments: No brace Restrictions Weight Bearing Restrictions: No Vital Signs: Therapy Vitals Temp: 98.3 F (36.8 C) Temp Source: Oral Pulse Rate: 83 Resp: 16 BP: (!) 140/109 Patient Position (if appropriate): Lying Oxygen Therapy SpO2: 98 % O2 Device: Room Air Pain: No c/o pain during tx   ADL:  :    See Function Navigator for Current Functional Status.   Therapy/Group: Individual Therapy  Moritz Lever A Edna Grover 03/16/2018, 3:52 PM

## 2018-03-17 ENCOUNTER — Inpatient Hospital Stay (HOSPITAL_COMMUNITY): Payer: Medicare Other | Admitting: *Deleted

## 2018-03-17 ENCOUNTER — Inpatient Hospital Stay (HOSPITAL_COMMUNITY): Payer: Medicare Other | Admitting: Occupational Therapy

## 2018-03-17 NOTE — Progress Notes (Signed)
Occupational Therapy Session Note  Patient Details  Name: HAYNES GIANNOTTI MRN: [id number] Date of Birth: Apr 18, 1951  Today's Date: 03/17/2018 OT Group Time:  - 60 minutes missed.     Skilled Therapeutic Interventions/Progress Updates:    Pt declined getting OOB for group therapy. 60 minutes missed.    Therapy Documentation Precautions:  Precautions Precautions: Fall, Cervical Precaution Comments: No brace Restrictions Weight Bearing Restrictions: No Vital Signs: Therapy Vitals Temp: 98 F (36.7 C) Temp Source: Oral Pulse Rate: 78 Resp: 18 BP: 134/75 Patient Position (if appropriate): Lying Oxygen Therapy SpO2: 96 % O2 Device: Room Air ADL:       See Function Navigator for Current Functional Status.   Therapy/Group: Group Therapy  Azriel Dancy A Azaryah Heathcock 03/17/2018, 7:26 AM

## 2018-03-17 NOTE — Progress Notes (Signed)
[facility] PHYSICAL MEDICINE & REHABILITATION     PROGRESS NOTE    Subjective/Complaints: No new complaints. Sleeping well. Denies pain. No spontaneous voids  ROS: Patient denies fever, rash, sore throat, blurred vision, nausea, vomiting, diarrhea, cough, shortness of breath or chest pain, joint or back pain, headache, or mood change.    Objective:   No results found. No results for input(s): WBC, HGB, HCT, PLT in the last 72 hours. No results for input(s): NA, K, CL, GLUCOSE, BUN, CREATININE, CALCIUM in the last 72 hours.  Invalid input(s): CO CBG (last 3)  No results for input(s): GLUCAP in the last 72 hours.  Wt Readings from Last 3 Encounters:  03/13/18 100 kg  03/02/18 99.8 kg  08/07/12 88.2 kg     Intake/Output Summary (Last 24 hours) at 03/17/2018 0749 Last data filed at 03/17/2018 0500 Gross per 24 hour  Intake 960 ml  Output 2075 ml  Net -1115 ml    Vital Signs: Blood pressure 134/75, pulse 78, temperature 98 F (36.7 C), temperature source Oral, resp. rate 18, height 5\' 11"  (1.803 m), weight 100 kg, SpO2 96 %. Physical Exam:  Constitutional: No distress . Vital signs reviewed. HEENT: EOMI, oral membranes moist Neck: supple Cardiovascular: RRR without murmur. No JVD    Respiratory: CTA Bilaterally without wheezes or rales. Normal effort    GI: BS +, non-tender, non-distended  Left lower extremity with 1  + edema persists.   Neurological: He isalertand oriented to person, place, and time.speech dysarthric speech Biceps and deltoids are 5 out of 5. triceps and HI remain 3 to 3+/5. Right lower extremity grossly 4 out of 5 proximal distal. LLE: 3- to 4-/5.Marland Kitchen   Sensory function remains decreased to light touch below the level of his injury. --stable exam Skin: Skin iswarm.  Psychiatric: pleasant   Assessment/Plan: 1. 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 Physiatrist is providing close team supervision and 24 hour management of active medical problems listed below. Physiatrist and rehab team continue to assess barriers to discharge/monitor patient progress toward functional and medical goals.  Function:  Bathing Bathing position Bathing activity did not occur: Refused Position: Wheelchair/chair at sink  Bathing parts Body parts bathed by patient: Right arm, Left arm, Chest, Abdomen(UB only) Body parts bathed by helper: Right lower leg, Left lower leg, Back  Bathing assist Assist Level: Set up, Supervision or verbal cues      Upper Body Dressing/Undressing Upper body dressing Upper body dressing/undressing activity did not occur: Refused What is the patient wearing?: Pull over shirt/dress     Pull over shirt/dress - Perfomed by patient: Thread/unthread right sleeve, Thread/unthread left sleeve, Put head through opening Pull over shirt/dress - Perfomed by helper: Pull shirt over trunk        Upper body assist Assist Level: Touching or steadying assistance(Pt > 75%)      Lower Body Dressing/Undressing Lower body dressing Lower body dressing/undressing activity did not occur: Refused What is the patient wearing?: Pants, Ted Hose, Non-skid slipper socks       Pants- Performed by helper: Thread/unthread right pants leg, Thread/unthread left pants leg, Pull pants up/down(pt A with pulling up pants 50%)   Non-skid slipper socks- Performed by helper: Don/doff right sock, Don/doff left sock               TED Hose - Performed by helper: Don/doff right TED hose, Don/doff  left TED hose  Lower body assist Assist for lower body dressing: (Total A)      Toileting Toileting     Toileting steps completed by helper: Adjust clothing after toileting    Toileting assist Assist level: Two helpers   Transfers Chair/bed transfer   Chair/bed transfer method: Lateral scoot Chair/bed transfer assist  level: 2 helpers Chair/bed transfer assistive device: Mechanical lift Mechanical lift: Maximove   Locomotion Ambulation Ambulation activity did not occur: Safety/medical Investment banker, operational activity did not occur: Safety/medical concerns Type: Manual Max wheelchair distance: 20 Assist Level: Supervision or verbal cues  Cognition Comprehension Comprehension assist level: Understands basic 75 - 89% of the time/ requires cueing 10 - 24% of the time  Expression Expression assist level: Expresses basic 50 - 74% of the time/requires cueing 25 - 49% of the time. Needs to repeat parts of sentences.  Social Interaction Social Interaction assist level: Interacts appropriately 75 - 89% of the time - Needs redirection for appropriate language or to initiate interaction.  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 DVT Prophylaxis/Anticoagulation: Extensive left lower extremity DVT status post IVC filter 02/27/2018.  no obvious bleeding on Eliquis  -hgb11.9  9/23---11.6 9/25---11.8 9/26  Recheck Monday 3. Pain Management:Tylenol as needed 4. Mood:Provide emotional support 5. Neuropsych: This patientiscapable of making decisions on hisown behalf. 6. Skin/Wound Care:Routine skin checks 7. Fluids/Electrolytes/Nutrition:  -encourage PO  8.Leukocytosis. Steroid-induced, ?reactive due to DVT.   -Urine study negative    9.Hypertension. HCTZ 25 mg daily, Coreg 12.5mg  twice daily, Norvasc 10 mg daily,hydralazine 100 mg every 8 hours.   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  Inconsistent volumes   --consider urecholine trial if he has sense of emptying 11.neurogenic bowel: -PM bowel program--still not consistent 12.History of alcohol use. Provide counseling  LOS (Days) 12 A FACE TO FACE EVALUATION WAS PERFORMED  Ranelle Oyster, MD 03/17/2018 7:49 AM

## 2018-03-18 ENCOUNTER — Inpatient Hospital Stay (HOSPITAL_COMMUNITY): Payer: Medicare Other | Admitting: Physical Therapy

## 2018-03-18 ENCOUNTER — Inpatient Hospital Stay (HOSPITAL_COMMUNITY): Payer: Medicare Other | Admitting: Occupational Therapy

## 2018-03-18 LAB — CBC
HCT: 34.7 % — ABNORMAL LOW (ref 39.0–52.0)
Hemoglobin: 11.3 g/dL — ABNORMAL LOW (ref 13.0–17.0)
MCH: 29.9 pg (ref 26.0–34.0)
MCHC: 32.6 g/dL (ref 30.0–36.0)
MCV: 91.8 fL (ref 78.0–100.0)
PLATELETS: 379 10*3/uL (ref 150–400)
RBC: 3.78 MIL/uL — ABNORMAL LOW (ref 4.22–5.81)
RDW: 14.2 % (ref 11.5–15.5)
WBC: 10.5 10*3/uL (ref 4.0–10.5)

## 2018-03-18 LAB — BASIC METABOLIC PANEL
Anion gap: 8 (ref 5–15)
BUN: 15 mg/dL (ref 8–23)
CO2: 25 mmol/L (ref 22–32)
Calcium: 8.8 mg/dL — ABNORMAL LOW (ref 8.9–10.3)
Chloride: 101 mmol/L (ref 98–111)
Creatinine, Ser: 0.49 mg/dL — ABNORMAL LOW (ref 0.61–1.24)
GFR calc Af Amer: >60 mL/min (ref >60)
GLUCOSE: 91 mg/dL (ref 70–99)
POTASSIUM: 3.9 mmol/L (ref 3.5–5.1)
Sodium: 134 mmol/L — ABNORMAL LOW (ref 135–145)

## 2018-03-18 MED ORDER — BETHANECHOL CHLORIDE 10 MG PO TABS
10.0000 mg | ORAL_TABLET | Freq: Three times a day (TID) | ORAL | Status: DC
  Administered 2018-03-18 – 2018-03-19 (×4): 10 mg via ORAL
  Filled 2018-03-18 (×4): qty 1

## 2018-03-18 NOTE — Progress Notes (Signed)
Physical Therapy Session Note  Patient Details  Name: Wayne Buckley MRN: [id number] Date of Birth: [birth year]  Today's Date: 03/18/2018 PT Individual Time: 1000-1100 PT Individual Time Calculation (min): 60 min   Short Term Goals: Week 1:  PT Short Term Goal 1 (Week 1): Pt will demonstrate bed mobility with mod assist using bed features. PT Short Term Goal 1 - Progress (Week 1): Progressing toward goal PT Short Term Goal 2 (Week 1): Pt will transition sit<>stand with +2 assist and without power lift equipment.  PT Short Term Goal 2 - Progress (Week 1): Met PT Short Term Goal 3 (Week 1): Pt will propel w/c 25' with min assist.  PT Short Term Goal 3 - Progress (Week 1): Progressing toward goal  Skilled Therapeutic Interventions/Progress Updates:    no c/o pain.  Session focus on functional mobility, trunk control, and activity tolerance.    Pt incontinent of bowel on arrival, per documentation pt continues to refuse bowel program, PT educated on importance of fully emptying bowels 1x/day to reduce risk of impaction and skin breakdown/infection.  Rolling L/R with min assist using bed rails, PT provided total assist for peri-care and donning clean brief.  Supine>sit with mod assist for upper body.  Slide board transfer with +2 to position board and to complete lateral scoot.  Pt continues to demo very poor forward weight shift and inability to maintain forward weight shift or head/hips relationship throughout transfer.  Scooting back in chair with +2 assist.  Sit<>stand in stedy from w/c with +2 assist and max multimodal cues for forward gaze and upright trunk.  Pt able to tolerate supported standing in stedy x10 minutes for horseshoe task with max cues to maintain trunk upright as he tends to lie forward on the machine.  When attempting to stand from elevated seat to sit, pt extends LEs completely but maintains 90 degrees of hip flexion, lying chest on the stedy bar with UEs dangling over.  Max cues  for safety and to elevate trunk with no effort from patient to correct.  Returned to supported standing in stedy, and educated pt on safety, when attempting to stand again to sit in w/c, no change in behavior from pt.  +2 to scoot back in w/c and pt educated on essential lack of progress towards goals since admission and importance of maximizing participation during therapy time.  Pt simply states, "well they're not going to send me home like this."  Returned to room and positioned upright in w/c with chair alarm intact and needs met.   Therapy Documentation Precautions:  Precautions Precautions: Fall, Cervical Precaution Comments: No brace Restrictions Weight Bearing Restrictions: No   See Function Navigator for Current Functional Status.   Therapy/Group: Individual Therapy  [name] 03/18/2018, 11:04 AM

## 2018-03-18 NOTE — Progress Notes (Addendum)
Physical Therapy Session Note  Patient Details  Name: Wayne Buckley MRN: [id number] Date of Birth: 1950/10/05  Today's Date: 03/18/2018 PT Individual Time: 1445-1530 PT Individual Time Calculation (min): 60 min   Short Term Goals: Week 2:  PT Short Term Goal 1 (Week 2): Pt will transfer with slide board and +1 assist PT Short Term Goal 2 (Week 2): Pt will propel w/c 61' with supervision PT Short Term Goal 3 (Week 2): Pt will tolerate supported standing x5 minutes for weight bearing and activity tolerance  Skilled Therapeutic Interventions/Progress Updates:  Pt presented in bed with wife present agreeable to therapy. Session focused on activity and standing tolerance. Performed supine to sit maxA x 1 and SB transfer to w/c maxA x 2 with max multimodal cues for hand placement, sequencing, and leaning forward. Pt propelled 36f with supervision and multiple rest breaks requiring encouragement from PTA to continue propelling. Transported remaining distance to day room and participated in standing frame x 3 bouts with tolerance of up to 2 minutes. Cues required for increasing wt shifting to RLE and maintaining erect posture. On third trial pt encouraged to perform TKE/mini squats which pt was able to perform x10 with tactile cues. Pt propelled additional 531fwith pt taking breaks approx every 1035fPt transported back to room and agreeable to remain in w/c. Pt left with belt alarm in place, call bell within reach, and current needs met.      Therapy Documentation Precautions:  Precautions Precautions: Fall, Cervical Precaution Comments: No brace Restrictions Weight Bearing Restrictions: No General:   Vital Signs: Therapy Vitals Temp: (!) 97.3 F (36.3 C) Temp Source: Oral Pulse Rate: 85 Resp: 17 BP: 120/74 Patient Position (if appropriate): Lying Oxygen Therapy SpO2: 100 % O2 Device: Room Air Pain: Pain Assessment Pain Scale: 0-10 Pain Score: 0-No pain  See Function Navigator for  Current Functional Status.   Therapy/Group: Individual Therapy  Shenna Brissette  Faraz Ponciano, PTA  03/18/2018, 12:32 PM

## 2018-03-18 NOTE — Progress Notes (Signed)
LLE with 2+ edema. Left great toe with foam dressing in place. PRN robitussin given at 2311. Refused scheduled supp., explained rationale for giving supp., patient continued to refuse. At 0300, I & O cath=1200cc's, after 8 hours, spoke at length with patient about importance of keeping urine volumes < 350. Patient continues to feel his sleep is more important. At 0700, I & O cath=700cc's. Alfredo Martinez A

## 2018-03-18 NOTE — Progress Notes (Signed)
Occupational Therapy Session Note  Patient Details  Name: Wayne Buckley MRN: [id number] Date of Birth: 12/16/1950  Today's Date: 03/18/2018 OT Individual Time: 1105-1200 OT Individual Time Calculation (min): 55 min   Short Term Goals: Week 2:  OT Short Term Goal 1 (Week 2): Pt will perform toilet transfer with assist of 1. OT Short Term Goal 2 (Week 2): Pt will perform LB dressing with mod A from bed level with modifications as needed. OT Short Term Goal 3 (Week 2): Pt will demonstrate long sitting with min A from bed.  Skilled Therapeutic Interventions/Progress Updates:    Pt greeted in w/c and amenable to tx. Started session with oral care/grooming tasks while seated at sink with increased time and supervision. Afterwards took pt to dayroom to work on sit<stand using Stedy. Pt able to achieve first stand with Mod A of 2. For second stand to remove paddles, pt with kyphotic posture, hunching over Stedy bar. Provided pt with max instructional and demonstrational cues for forward gaze and upright trunk. Pt at one point leaned UB over Stedy bar with arms dangling due to behaviors. For pts safety, decided to terminate standing due to pts frustration/agitation and reluctance to follow instruction. Spouse present to observe. Next had pt complete w/c level obstacle course in dayroom. He required max vcs to navigate around obstacles instead of pushing them to one side. Therapist had to fixate obstacles so that he could not do this. Pt initiated several rest periods due to fatigue. He then self propelled halfway to room, and was escorted for remainder of way due to NT needing to cath pt in bed. Slideboard<bed completed with 2 assist and manual facilitation for forward weight shifting. He was left in bed with NT.   Therapy Documentation Precautions:  Precautions Precautions: Fall, Cervical Precaution Comments: No brace Restrictions Weight Bearing Restrictions: No Pain: Pain Assessment Pain Scale:  0-10 Pain Score: 0-No pain ADL:      See Function Navigator for Current Functional Status.   Therapy/Group: Individual Therapy  [name] A Monico Sudduth 03/18/2018, 12:36 PM

## 2018-03-18 NOTE — Progress Notes (Signed)
[place] [facility]IC[facility] & REHABILITATION     PROGRESS NOTE    Subjective/Complaints: Slept ok. States that his bladder emptied yesterday but per RN refused bowel,bladder program. No evidence of spontaneous void except once?  ROS: Patient denies fever, rash, sore throat, blurred vision, nausea, vomiting, diarrhea, cough, shortness of breath or chest pain, joint or back pain, headache, or mood change.    Objective:   No results found. Recent Labs    03/18/18 0649  WBC 10.5  HGB 11.3*  HCT 34.7*  PLT 379   Recent Labs    03/18/18 0649  NA 134*  K 3.9  CL 101  GLUCOSE 91  BUN 15  CREATININE 0.49*  CALCIUM 8.8*   CBG (last 3)  No results for input(s): GLUCAP in the last 72 hours.  Wt Readings from Last 3 Encounters:  03/13/18 100 kg  03/02/18 99.8 kg  08/07/12 88.2 kg     Intake/Output Summary (Last 24 hours) at 03/18/2018 0825 Last data filed at 03/18/2018 0800 Gross per 24 hour  Intake 960 ml  Output 3000 ml  Net -2040 ml    Vital Signs: Blood pressure 140/83, pulse 77, temperature 98.3 F (36.8 C), temperature source Oral, resp. rate 18, height 5\' 11"  (1.803 m), weight 100 kg, SpO2 97 %. Physical Exam:  Constitutional: No distress . Vital signs reviewed. HEENT: EOMI, oral membranes moist Neck: supple Cardiovascular: RRR without murmur. No JVD    Respiratory: CTA Bilaterally without wheezes or rales. Normal effort    GI: BS +, non-tender,sl-distended  Left lower extremity with 1  + edema improving   Neurological: He isalertand oriented to person, place, and time.speech dysarthric speech Biceps and deltoids are 5 out of 5. triceps and HI remain 3 to 3+/5. Right lower extremity grossly 4 out of 5 proximal distal. LLE: 3- to 4-/5.--effort an issue at times.   Sensory function remains decreased to light touch below the level of his injury.   Skin: Skin iswarm.  Psychiatric: cooperative   Assessment/Plan: 1. 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 Physiatrist is providing close team supervision and 24 hour management of active medical problems listed below. Physiatrist and rehab team continue to assess barriers to discharge/monitor patient progress toward functional and medical goals.  Function:  Bathing Bathing position Bathing activity did not occur: Refused Position: Wheelchair/chair at sink  Bathing parts Body parts bathed by patient: Right arm, Left arm, Chest, Abdomen(UB only) Body parts bathed by helper: Right lower leg, Left lower leg, Back  Bathing assist Assist Level: Set up, Supervision or verbal cues      Upper Body Dressing/Undressing Upper body dressing Upper body dressing/undressing activity did not occur: Refused What is the patient wearing?: Pull over shirt/dress     Pull over shirt/dress - Perfomed by patient: Thread/unthread right sleeve, Thread/unthread left sleeve, Put head through opening Pull over shirt/dress - Perfomed by helper: Pull shirt over trunk        Upper body assist Assist Level: Touching or steadying assistance(Pt > 75%)      Lower Body Dressing/Undressing Lower body dressing Lower body dressing/undressing activity did not occur: Refused What is the patient wearing?: Pants, Ted Hose, Non-skid slipper socks       Pants- Performed by helper: Thread/unthread right pants leg, Thread/unthread left pants leg, Pull pants up/down(pt A with pulling up pants 50%)   Non-skid slipper socks- Performed by  helper: Don/doff right sock, Don/doff left sock               TED Hose - Performed by helper: Don/doff right TED hose, Don/doff left TED hose  Lower body assist Assist for lower body dressing: (Total A)      Toileting Toileting     Toileting steps completed by helper: Adjust clothing after toileting    Toileting assist Assist level: Two helpers    Transfers Chair/bed transfer   Chair/bed transfer method: Lateral scoot Chair/bed transfer assist level: 2 helpers Chair/bed transfer assistive device: Mechanical lift Mechanical lift: Maximove   Locomotion Ambulation Ambulation activity did not occur: Safety/medical Investment banker, operational activity did not occur: Safety/medical concerns Type: Manual Max wheelchair distance: 20 Assist Level: Supervision or verbal cues  Cognition Comprehension Comprehension assist level: Understands basic 75 - 89% of the time/ requires cueing 10 - 24% of the time  Expression Expression assist level: Expresses basic 50 - 74% of the time/requires cueing 25 - 49% of the time. Needs to repeat parts of sentences.  Social Interaction Social Interaction assist level: Interacts appropriately 75 - 89% of the time - Needs redirection for appropriate language or to initiate interaction.  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 DVT Prophylaxis/Anticoagulation: Extensive left lower extremity DVT status post IVC filter 02/27/2018.  no obvious bleeding on Eliquis  -hgb11.9  9/23---11.6 9/25---11.8 9/26  hgb holding at 11.3 9/30 3. Pain Management:Tylenol as needed 4. Mood:Provide emotional support 5. Neuropsych: This patientiscapable of making decisions on hisown behalf. 6. Skin/Wound Care:Routine skin checks 7. Fluids/Electrolytes/Nutrition:  -encourage PO   -I personally reviewed all of the patient's labs  today, and lab work is within normal limits.  8.Leukocytosis. Steroid-induced, ?reactive due to DVT.   -Urine study negative    9.Hypertension. HCTZ 25 mg daily, Coreg 12.5mg  twice daily, Norvasc 10 mg daily,hydralazine 100 mg every 8 hours.   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 Provide counseling  LOS (Days) 13 A FACE TO FACE EVALUATION WAS PERFORMED  Ranelle Oyster, MD 03/18/2018 8:25 AM

## 2018-03-18 NOTE — Progress Notes (Signed)
Physical Therapy Session Note  Patient Details  Name: Wayne Buckley MRN: [id number] Date of Birth: 1951/01/27  Today's Date: 03/18/2018 PT Individual Time: 0102-7253 PT Individual Time Calculation (min): 72 min   Short Term Goals: Week 2:  PT Short Term Goal 1 (Week 2): Pt will transfer with slide board and +1 assist PT Short Term Goal 2 (Week 2): Pt will propel w/c 35' with supervision PT Short Term Goal 3 (Week 2): Pt will tolerate supported standing x5 minutes for weight bearing and activity tolerance  Skilled Therapeutic Interventions/Progress Updates:    no c/o pain.  Pt noted to be incontinent of bowel on arrival with feces caked on skin.  Again stressed importance of bowel program and for calling for assistance if brief was wet/soilet (pt reports having had bowel movement at least 30 minutes prior to PT arrival).  Rolling L/R to remove brief, PT provided total assist for clothing management and peri-care, during which pt again had small BM, states he can tell he's going.    Supine>sit with min assist and HOB elevated to maximum, heavy reliance on bedrails.  Slide board transfer to w/c on L with total +2 assist for forward weight shift and lateral scoot.  Pt propelled w/c to therapy gym with 7 rest breaks along the way, PT encouraged pt to continue by counting down a goal of 10-20 pushes, though pt never made it to goals.  Slide board transfer L<>R from mat<>w/c x2 in each direction with therapist in front of pt in armchair.  With this set up, pt able to transfer with min/mod assist and mod cues for maintaining head/hips relationship and foot placement during transfer.  Pt requires max encouragement to complete last 2 transfers.    Pt continues to require max encouragement for participation and maximum effort during therapy sessions.  Wife present this session and attempting to motivate pt as well.  Returned to room at end of session and positioned upright in w/c with chair alarm intact, call  bell in reach and needs met.   Therapy Documentation Precautions:  Precautions Precautions: Fall, Cervical Precaution Comments: No brace Restrictions Weight Bearing Restrictions: No   See Function Navigator for Current Functional Status.   Therapy/Group: Individual Therapy  [name] 03/18/2018, 3:29 PM

## 2018-03-19 ENCOUNTER — Inpatient Hospital Stay (HOSPITAL_COMMUNITY): Payer: Medicare Other | Admitting: Physical Therapy

## 2018-03-19 ENCOUNTER — Inpatient Hospital Stay (HOSPITAL_COMMUNITY): Payer: Medicare Other | Admitting: Occupational Therapy

## 2018-03-19 MED ORDER — BETHANECHOL CHLORIDE 25 MG PO TABS
25.0000 mg | ORAL_TABLET | Freq: Three times a day (TID) | ORAL | Status: DC
  Administered 2018-03-19 – 2018-03-25 (×18): 25 mg via ORAL
  Filled 2018-03-19 (×18): qty 1

## 2018-03-19 NOTE — Progress Notes (Signed)
Occupational Therapy Session Note  Patient Details  Name: Wayne Buckley MRN: [id number] Date of Birth: [birth year]  Today's Date: 03/19/2018 OT Individual Time: 4098-1191 OT Individual Time Calculation (min): 57 min    Short Term Goals: Week 2:  OT Short Term Goal 1 (Week 2): Pt will perform toilet transfer with assist of 1. OT Short Term Goal 2 (Week 2): Pt will perform LB dressing with mod A from bed level with modifications as needed. OT Short Term Goal 3 (Week 2): Pt will demonstrate long sitting with min A from bed.  Skilled Therapeutic Interventions/Progress Updates:    Upon entering the room, pt supine in bed with no c/o pain this session. Pt verbalizing he had not had breakfast yet and requiring encouragement to participate in OT intervention. Pt becoming upset and yelling at therapist stating, " You can't keep me from my food. Come back at another time." OT educating pt on participation with scheduled therapy. RN arrived as pt had actually refused breakfast tray 4 times this morning but wanted to eat instead of participation with OT. Pt rolling L <> R with mod A and total A to don pull over shorts. Supine >sit with mod A to EOB. Slide board transfer from bed >wheelchair with total A +2 for safety with transfer. Pt's wife arrived to provide encouragement. Pt propelled wheelchair with B UEs 75' towards day room with 4 stops secondary to fatigue. B LE exercises against gravity with pt requiring max verbal cues for proper technique and counting. OT removing leg rest from wheelchair as pt continues to report , " I can't lift my legs on my own." However, pt lifting B LEs as he propelled wheelchair back to room. Chair alarm activated and call bell within reach. Wife remains present in room.   Therapy Documentation Precautions:  Precautions Precautions: Fall, Cervical Precaution Comments: No brace Restrictions Weight Bearing Restrictions: No Pain: Pain Assessment Pain Scale: 0-10 Pain Score:  0-No pain   Therapy/Group: Individual Therapy  [name] 03/19/2018, 12:34 PM

## 2018-03-19 NOTE — Progress Notes (Signed)
Physical Therapy Session Note  Patient Details  Name: Wayne Buckley MRN: [id number] Date of Birth: 01-09-1951  Today's Date: 03/19/2018 PT Individual Time: 1100-1200 PT Individual Time Calculation (min): 60 min   Short Term Goals: Week 2:  PT Short Term Goal 1 (Week 2): Pt will transfer with slide board and +1 assist PT Short Term Goal 2 (Week 2): Pt will propel w/c 48' with supervision PT Short Term Goal 3 (Week 2): Pt will tolerate supported standing x5 minutes for weight bearing and activity tolerance  Skilled Therapeutic Interventions/Progress Updates:    no c/o pain.  Session focus on activity tolerance and trunk control with w/c mobility and transfers.    Pt propels w/c to therapy gym with PT providing targets and max encouragement for pt to propel up to 30' at a time.  Slide board transfer to R/L x2 in both directions with PT sitting in front of patient with assist to place board and min guard and min verbal cues for lateral scoot transfer.  When transferring to L, pt requires increased assist at trunk to maintain forward weight shift when going into chair.  Transfer to R without armchair set up and pt continues to demo ability to transfer with min guard and cues for hand placement.  Pt completes 2x5 reps of sit<>squat with arm chair in front to encourage forward weight shift and PT facilitating trunk control.  Progress to sit<>stand x4 in stedy with mod assist +1 and max multimodal cues for L knee extension and upright posture.  Mirror for visual feedback with little success.  Pt positioned in semi-recumbent position for anterior cervical musculature and anterior chest stretch.  Educated pt and wife on positioning in w/c providing some trunk stability with improved head/neck control versus sitting edge of mat requiring patient to provide stability and control from pelvis up.  Also recommended daily stretch in bed with towel roll for anterior chest/neck.  Wife verbalized understanding.  Pt  returned to room at end of session in stedy focus on trunk control and upright posture.  Positioned in w/c with chair alarm intact, call bell in reach and needs met.   Therapy Documentation Precautions:  Precautions Precautions: Fall, Cervical Precaution Comments: No brace Restrictions Weight Bearing Restrictions: No    Therapy/Group: Individual Therapy  [name] 03/19/2018, 1:01 PM

## 2018-03-19 NOTE — Progress Notes (Signed)
Physical Therapy Session Note  Patient Details  Name: Wayne Buckley MRN: [id number] Date of Birth: 27-Nov-1950  Today's Date: 03/19/2018 PT Individual Time: 1028-9022 PT Individual Time Calculation (min): 30 min   Short Term Goals: Week 2:  PT Short Term Goal 1 (Week 2): Pt will transfer with slide board and +1 assist PT Short Term Goal 2 (Week 2): Pt will propel w/c 84' with supervision PT Short Term Goal 3 (Week 2): Pt will tolerate supported standing x5 minutes for weight bearing and activity tolerance  Skilled Therapeutic Interventions/Progress Updates: Pt presented in w/c with wife present agreeable to therapy. Session focused on sit to stand and attempted standing tolerance. Pt transported to rehab gym for energy and time conservation. Pt participated in STS in [place] requiring modA. Performed STS from Stedy height x 6 with cues for increased posture and attempted to increase time by counting up to 10. Pt increased speed of counting with each bout and required increased cues for erect posture. Pt transported back to room and performed final STS in Stedy to transfer back to bed. Pt shifting forward upon sitting requiring max cues from PTA to scoot posteriorly in bed. Pt required maxA from PTA to transfer to bed. Per pt and wife have been requesting heating pad due to pain in neck and shoulders. Provided pt with heating pad and advised wife to check skin after approx 20 min. Nsg also notified of application of heating pad. Pt left in bed with bed alarm on, call bell within reach, and needs met.      Therapy Documentation Precautions:  Precautions Precautions: Fall, Cervical Precaution Comments: No brace Restrictions Weight Bearing Restrictions: No General:   Vital Signs: Therapy Vitals Temp: 98.2 F (36.8 C) Temp Source: Oral Pulse Rate: 89 Resp: 17 BP: 123/67 Patient Position (if appropriate): Sitting Oxygen Therapy SpO2: 99 % O2 Device: Room Air  Therapy/Group: Individual  Therapy  Cosme Jacob  Emeri Estill, PTA  03/19/2018, 4:03 PM

## 2018-03-19 NOTE — Progress Notes (Signed)
Physical Therapy Session Note  Patient Details  Name: Wayne Buckley MRN: [id number] Date of Birth: December 24, 1950  Today's Date: 03/19/2018 PT Individual Time: 8295-6213 PT Individual Time Calculation (min): 30 min   Short Term Goals: Week 2:  PT Short Term Goal 1 (Week 2): Pt will transfer with slide board and +1 assist PT Short Term Goal 2 (Week 2): Pt will propel w/c 19' with supervision PT Short Term Goal 3 (Week 2): Pt will tolerate supported standing x5 minutes for weight bearing and activity tolerance  Skilled Therapeutic Interventions/Progress Updates:  Pt received in w/c & agreeable to tx, denying c/o pain. Pt's wife present for majority of session but exiting when therapist began assisting pt back to room. Pt propels w/c room<>gym with BUE & supervision with frequent rest breaks despite encouragement for further distances before resting. Pt completes equal height slide board transfer w/c>mat table on R with min assist +2 for safety with max cuing for head/hips relationship, anterior weight shifting, and sequencing but pt with good ability to scoot buttocks across board. Once on EOM pt engaged in lateral leans on B elbows but pt more hesitant when leaning on RUE with task focusing on BUE strengthening and pushing self to return to midline. Pt demonstrates BUE ataxia and therapist educates him on need to maintain B hand contact on mat table or stable surface for weight bearing and tactile feedback but pt with poor understanding and demo of education. Pt then engaged in scooting laterally across EOM with +2 assist with heavy focus on anterior weight shifting and pushing with BUE/BLE to clear buttocks from mat before scooting L/R but with poor return demo from pt. Pt assisted back to w/c with slide board to L with mod assist +2 for safety with cuing for hand placement. At end of session pt left sitting in w/c in room with alarm belt donned & all needs in reach.  Therapist provides cuing during  session to maintain cervical precautions during functional mobility.   Therapy Documentation Precautions:  Precautions Precautions: Fall, Cervical Precaution Comments: No brace Restrictions Weight Bearing Restrictions: No    Therapy/Group: Individual Therapy  Sandi Mariscal 03/19/2018, 4:03 PM

## 2018-03-19 NOTE — Progress Notes (Signed)
[place] PHYSICAL MEDICINE & REHABILITATION     PROGRESS NOTE    Subjective/Complaints: Sleeping in this morning. No new complaints. He and his wife talked yesterday after I spoke to wife re: buy in for rehab  ROS: Patient denies fever, rash, sore throat, blurred vision, nausea, vomiting, diarrhea, cough, shortness of breath or chest pain,   headache, or mood change.   Objective:   No results found. Recent Labs    03/18/18 0649  WBC 10.5  HGB 11.3*  HCT 34.7*  PLT 379   Recent Labs    03/18/18 0649  NA 134*  K 3.9  CL 101  GLUCOSE 91  BUN 15  CREATININE 0.49*  CALCIUM 8.8*   CBG (last 3)  No results for input(s): GLUCAP in the last 72 hours.  Wt Readings from Last 3 Encounters:  03/13/18 100 kg  03/02/18 99.8 kg  08/07/12 88.2 kg     Intake/Output Summary (Last 24 hours) at 03/19/2018 0825 Last data filed at 03/19/2018 0430 Gross per 24 hour  Intake 1474 ml  Output 2866 ml  Net -1392 ml    Vital Signs: Blood pressure 117/81, pulse 74, temperature 98.6 F (37 C), temperature source Oral, resp. rate 12, height 5\' 11"  (1.803 m), weight 100 kg, SpO2 100 %. Physical Exam:  Constitutional: No distress . Vital signs reviewed. HEENT: EOMI, oral membranes moist Neck: supple Cardiovascular: RRR without murmur. No JVD    Respiratory: CTA Bilaterally without wheezes or rales. Normal effort    GI: BS +, non-tender, slight-distended  Left lower extremity with 1  + edema improving   Neurological: He isalertand oriented to person, place, and time.speech dysarthric speech Biceps and deltoids are 5 out of 5. triceps and HI remain 3 to 3+/5. Right lower extremity grossly 4 out of 5 proximal distal. LLE: 3- to 4-/5.--effort an issue at times.   Sensory function remains decreased to light touch below the level of his injury.   Skin: Skin iswarm.  Psychiatric: cooperative   Assessment/Plan: 1. 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 Physiatrist is providing close team supervision and 24 hour management of active medical problems listed below. Physiatrist and rehab team continue to assess barriers to discharge/monitor patient progress toward functional and medical goals.  Function:  Bathing Bathing position Bathing activity did not occur: Refused Position: Wheelchair/chair at sink  Bathing parts Body parts bathed by patient: Right arm, Left arm, Chest, Abdomen(UB only) Body parts bathed by helper: Right lower leg, Left lower leg, Back  Bathing assist Assist Level: Set up, Supervision or verbal cues      Upper Body Dressing/Undressing Upper body dressing Upper body dressing/undressing activity did not occur: Refused What is the patient wearing?: Pull over shirt/dress     Pull over shirt/dress - Perfomed by patient: Thread/unthread right sleeve, Thread/unthread left sleeve, Put head through opening Pull over shirt/dress - Perfomed by helper: Pull shirt over trunk        Upper body assist Assist Level: Touching or steadying assistance(Pt > 75%)      Lower Body Dressing/Undressing Lower body dressing Lower body dressing/undressing activity did not occur: Refused What is the patient wearing?: Pants, Ted Hose, Non-skid slipper socks       Pants- Performed by helper: Thread/unthread right pants leg, Thread/unthread left pants leg, Pull pants up/down(pt A with pulling up pants 50%)   Non-skid slipper socks- Performed by  helper: Don/doff right sock, Don/doff left sock               TED Hose - Performed by helper: Don/doff right TED hose, Don/doff left TED hose  Lower body assist Assist for lower body dressing: (Total A)      Toileting Toileting Toileting activity did not occur: Refused   Toileting steps completed by helper: Adjust clothing after toileting    Toileting assist Assist level: Two  helpers   Transfers Chair/bed transfer   Chair/bed transfer method: Lateral scoot Chair/bed transfer assist level: 2 helpers Chair/bed transfer assistive device: Sliding board, Armrests Mechanical lift: Maximove   Locomotion Ambulation Ambulation activity did not occur: Safety/medical Investment banker, operational activity did not occur: Safety/medical concerns Type: Manual Max wheelchair distance: 20 Assist Level: Supervision or verbal cues  Cognition Comprehension Comprehension assist level: Understands basic 75 - 89% of the time/ requires cueing 10 - 24% of the time  Expression Expression assist level: Expresses basic 50 - 74% of the time/requires cueing 25 - 49% of the time. Needs to repeat parts of sentences.  Social Interaction Social Interaction assist level: Interacts appropriately 75 - 89% of the time - Needs redirection for appropriate language or to initiate interaction.  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 DVT Prophylaxis/Anticoagulation: Extensive left lower extremity DVT status post IVC filter 02/27/2018.  no obvious bleeding on Eliquis  -hgb11.9  9/23---11.6 9/25---11.8 9/26  hgb holding at 11.3 9/30 3. Pain Management:Tylenol as needed 4. Mood:Provide emotional support 5. Neuropsych: This patientiscapable of making decisions on hisown behalf. 6. Skin/Wound Care:Routine skin checks 7. Fluids/Electrolytes/Nutrition:  -encourage PO     8.Leukocytosis.     -Urine  study negative   -resolved 9.Hypertension. HCTZ 25 mg daily, Coreg 12.5mg  twice daily, Norvasc 10 mg daily,hydralazine 100 mg every 8 hours.   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     --increase urecholine to 25mg  TID   -reviewed with pt and wife re: need for caths until he is able to empty on his own. 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 Provide counseling  LOS (Days) 14 A FACE TO FACE EVALUATION WAS PERFORMED  Ranelle Oyster, MD 03/19/2018 8:25 AM

## 2018-03-20 ENCOUNTER — Inpatient Hospital Stay (HOSPITAL_COMMUNITY): Payer: Medicare Other | Admitting: Occupational Therapy

## 2018-03-20 ENCOUNTER — Inpatient Hospital Stay (HOSPITAL_COMMUNITY): Payer: Medicare Other | Admitting: Physical Therapy

## 2018-03-20 NOTE — Progress Notes (Signed)
Social Work Patient ID: Wayne Buckley, male   DOB: [birth year], 67 y.o.   MRN: [id number]   Met with pt's wife then together with wife and pt today to review team conference and concerns.  Discussed the concern that pt continues to make slow gains and that, while his participation has some improved, the team may need to downgrade original (min assist w/c) goals.  Noted that he may not be able to reach a level of function while on CIR that wife would be able to manage alone at home.  Discussed possibility of SNF following CIR.  Both understand and agree with team concerns.  Wife stressed to pt that he had "to give it everything you've got here."  Wife hopeful that, if pt were to begin to show more substantial improvement, he might be able to complete his entire projected LOS prior to SNF transfer.  She spoke openly with me and pt about her concerns and does feel that SNF may be "the only option I have" unless significant change is seen in pt's overall function.  Will continue to follow and monitor progress and provide support.  Ketan Renz, LCSW

## 2018-03-20 NOTE — Progress Notes (Signed)
[place] PHYSICAL MEDICINE & REHABILITATION     PROGRESS NOTE    Subjective/Complaints: No new complaints.   ROS: Patient denies fever, rash, sore throat, blurred vision, nausea, vomiting, diarrhea, cough, shortness of breath or chest pain, joint or back pain, headache, or mood change.    Objective:   No results found. Recent Labs    03/18/18 0649  WBC 10.5  HGB 11.3*  HCT 34.7*  PLT 379   Recent Labs    03/18/18 0649  NA 134*  K 3.9  CL 101  GLUCOSE 91  BUN 15  CREATININE 0.49*  CALCIUM 8.8*   CBG (last 3)  No results for input(s): GLUCAP in the last 72 hours.  Wt Readings from Last 3 Encounters:  03/20/18 101 kg  03/02/18 99.8 kg  08/07/12 88.2 kg     Intake/Output Summary (Last 24 hours) at 03/20/2018 0836 Last data filed at 03/20/2018 0656 Gross per 24 hour  Intake 840 ml  Output 1950 ml  Net -1110 ml    Vital Signs: Blood pressure 114/78, pulse 82, temperature 98.7 F (37.1 C), temperature source Oral, resp. rate 18, height 5\' 11"  (1.803 m), weight 101 kg, SpO2 96 %. Physical Exam:  Constitutional: No distress . Vital signs reviewed. HEENT: EOMI, oral membranes moist Neck: supple Cardiovascular: RRR without murmur. No JVD    Respiratory: CTA Bilaterally without wheezes or rales. Normal effort    GI: BS +, non-tender, non-distended  Left lower extremity with 1  + edema improving   Neurological: He isalertand oriented to person, place, and time.speech dysarthric speech Biceps and deltoids are 5 out of 5. triceps and HI remain 3 to 3+/5. Right lower extremity grossly 4 out of 5 proximal distal. LLE: 3- to 4-/5.--no changes   Sensory function remains decreased to light touch below the level of his injury.   Skin: Skin iswarm.  Psychiatric: cooperative   Assessment/Plan: 1. 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 Physiatrist is providing close team supervision and 24 hour management of active medical problems listed below. Physiatrist and rehab team continue to assess barriers to discharge/monitor patient progress toward functional and medical goals.  Function:  Bathing Bathing position Bathing activity did not occur: Refused Position: Wheelchair/chair at sink  Bathing parts Body parts bathed by patient: Right arm, Left arm, Chest, Abdomen(UB only) Body parts bathed by helper: Right lower leg, Left lower leg, Back  Bathing assist Assist Level: Set up, Supervision or verbal cues      Upper Body Dressing/Undressing Upper body dressing Upper body dressing/undressing activity did not occur: Refused What is the patient wearing?: Pull over shirt/dress     Pull over shirt/dress - Perfomed by patient: Thread/unthread right sleeve, Thread/unthread left sleeve, Put head through opening Pull over shirt/dress - Perfomed by helper: Pull shirt over trunk        Upper body assist Assist Level: Touching or steadying assistance(Pt > 75%)      Lower Body Dressing/Undressing Lower body dressing Lower body dressing/undressing activity did not occur: Refused What is the patient wearing?: Pants, Ted Hose, Non-skid slipper socks       Pants- Performed by helper: Thread/unthread right pants leg, Thread/unthread left pants leg, Pull pants up/down(pt A with pulling up pants 50%)   Non-skid slipper socks- Performed by helper: Don/doff right sock, Don/doff left sock  TED Hose - Performed by helper: Don/doff right TED hose, Don/doff left TED hose  Lower body assist Assist for lower body dressing: (Total A)      Toileting Toileting Toileting activity did not occur: Refused   Toileting steps completed by helper: Adjust clothing after toileting    Toileting assist Assist level: Two helpers   Transfers Chair/bed transfer   Chair/bed transfer method: Lateral scoot Chair/bed  transfer assist level: 2 helpers Chair/bed transfer assistive device: Sliding board, Armrests Mechanical lift: Maximove   Locomotion Ambulation Ambulation activity did not occur: Safety/medical Investment banker, operational activity did not occur: Safety/medical concerns Type: Manual Max wheelchair distance: 20 Assist Level: Supervision or verbal cues  Cognition Comprehension Comprehension assist level: Understands basic 75 - 89% of the time/ requires cueing 10 - 24% of the time  Expression Expression assist level: Expresses basic 50 - 74% of the time/requires cueing 25 - 49% of the time. Needs to repeat parts of sentences.  Social Interaction Social Interaction assist level: Interacts appropriately 75 - 89% of the time - Needs redirection for appropriate language or to initiate interaction.  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 Pt needs constant encourage due to behavioral and cognitive issues 2. DVT Prophylaxis/Anticoagulation: Extensive left lower extremity DVT status post IVC filter 02/27/2018.  no obvious bleeding on Eliquis  -hgb11.9  9/23---11.6 9/25---11.8 9/26  hgb holding at 11.3 9/30 3. Pain Management:Tylenol as needed 4. Mood:Provide emotional support 5. Neuropsych: This patientiscapable of making decisions on hisown behalf. 6. Skin/Wound Care:Routine skin checks 7. Fluids/Electrolytes/Nutrition:  -encourage PO     8.Leukocytosis.     -Urine study negative   -resolved 9.Hypertension. HCTZ 25 mg  daily, Coreg 12.5mg  twice daily, Norvasc 10 mg daily,hydralazine 100 mg every 8 hours.   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 Provide counseling  LOS (Days) 15 A FACE TO FACE EVALUATION WAS PERFORMED  Ranelle Oyster, MD 03/20/2018 8:36 AM

## 2018-03-20 NOTE — Progress Notes (Signed)
Occupational Therapy Session Note  Patient Details  Name: Wayne Buckley MRN: [id number] Date of Birth: 1951/05/06  Today's Date: 03/20/2018 OT Individual Time: 4098-1191 OT Individual Time Calculation (min): 25 min    Short Term Goals: Week 2:  OT Short Term Goal 1 (Week 2): Pt will perform toilet transfer with assist of 1. OT Short Term Goal 2 (Week 2): Pt will perform LB dressing with mod A from bed level with modifications as needed. OT Short Term Goal 3 (Week 2): Pt will demonstrate long sitting with min A from bed.  Skilled Therapeutic Interventions/Progress Updates:    Pt completed transfer from supine to sit EOB with mod assist.  Once sitting pt exhibits choreic movements in his arms and trunk.  Worked during session on scooting up and down the bed with slower controlled forward trunk flexion and then a scoot using the Bobath method to help decrease his trunk extension.  He was able to complete scooting with overall max assist to the right and left, but at times he was able to complete with mod assist.  He completed transition back to supine after several scoots with overall mod assist using side flexion pattern and then rolling to his back to break up extension patterns.  Had him finish session with rolling side to side for positioning of bed pad with emphasis on activation of abdominals with rolling instead of relying on use of the bed rails.  He completed with min assist to both sides.  Pt left in bed with call button and phone in reach and bed alarm in place.    Therapy Documentation Precautions:  Precautions Precautions: Fall, Cervical Precaution Comments: No brace Restrictions Weight Bearing Restrictions: No   Pain: No report of pain  Therapy/Group: Individual Therapy  Sapphire Tygart,Gjon OTR/L 03/20/2018, 4:12 PM

## 2018-03-20 NOTE — Progress Notes (Signed)
Occupational Therapy Session Note  Patient Details  Name: Wayne Buckley MRN: [id number] Date of Birth: May 26, 1951  Today's Date: 03/20/2018 OT Individual Time: 5621-3086 and 1500-1530 OT Individual Time Calculation (min): 72 min and 30 min    Short Term Goals: Week 2:  OT Short Term Goal 1 (Week 2): Pt will perform toilet transfer with assist of 1. OT Short Term Goal 2 (Week 2): Pt will perform LB dressing with mod A from bed level with modifications as needed. OT Short Term Goal 3 (Week 2): Pt will demonstrate long sitting with min A from bed.  Skilled Therapeutic Interventions/Progress Updates:   Session 1: Upon entering the room, pt supine in bed and finishing breakfast. Pt agreeable to OT intervention. LB clothing donned from bed level with min A rolling L <> R. Supine >sit with mod A to EOB. Placement of slide board and assistance of +2 for safety and max verbal cues for head hip relationship. Pt agreeable to UB bathing/dressing and grooming tasks from wheelchair level at sink. Pt required min A overall with increased time and mod multimodal cuing for sequencing of tasks. Pt engaged in sit >stand from wheelchair into stedy with max A and max cuing for technique x 5 reps with pt standing twice for 30 seconds. Pt returning to room at end of session and remaining in wheelchair with chair alarm activated and wife present in room.   Session 2: Upon entering the room, pt supine in bed with wife present in room. Skilled OT intervention for bed mobility, standing tolerance, and balance. Bed rails removed from bed and pt needing mod A for trunk with max multimodal cues for sequencing supine >sit. Pt sitting on EOB with min A. Pt unable to follow commands for sit >supine and required total A for safety. OT demonstrated this to caregiver x 2 reps. She agreed that if pt returning home he will need hospital bed. Pt engaged in 3 sit <>stand from elevated steady seat for 30 seconds each time. Pt required  maximal encouragement and max manual facilitation for upright position once in standing. Pt returning to bed at end of session with call bell and all needed items within reach.   Therapy Documentation Precautions:  Precautions Precautions: Fall, Cervical Precaution Comments: No brace Restrictions Weight Bearing Restrictions: No   Therapy/Group: Individual Therapy  [name] 03/20/2018, 12:34 PM

## 2018-03-20 NOTE — Progress Notes (Signed)
Physical Therapy Session Note  Patient Details  Name: Wayne Buckley MRN: [id number] Date of Birth: [birth year]  Today's Date: 03/20/2018 PT Individual Time: 1100-1200 PT Individual Time Calculation (min): 60 min   Short Term Goals: Week 2:  PT Short Term Goal 1 (Week 2): Pt will transfer with slide board and +1 assist PT Short Term Goal 2 (Week 2): Pt will propel w/c 4' with supervision PT Short Term Goal 3 (Week 2): Pt will tolerate supported standing x5 minutes for weight bearing and activity tolerance  Skilled Therapeutic Interventions/Progress Updates:    Pt received seated in w/c in room, agreeable to PT. Pt reports some soreness in upper back and shoulder area and has been using hot pack for pain relief. Sliding board transfer w/c to mat table in gym with min A x 1 and CGA x 1 to the R, max cueing for hand placement and forward weight shift during transfer. Sitting balance EOM with CGA to min A, focus on leaning forwards to reach for horseshoes with alt UE and then returning to upright position. Seated forward reach with BUE onto theraball with min A for forward lean and return to upright position. Pt has onset of dizziness with activity, unable to obtain accurate BP reading due to ataxia in LUE but symptoms subside with seated rest break. Sliding board transfer mat table to w/c to the R with CGA x 1. Sliding board transfer w/c to/from mat table to L side with mod A x 1 and min A x 1 then min A x 1 for 2nd transfer. Sit to stand w/c to stedy with max A x 1 and min A x 1. Pt requires max assist at hips and mod A at shoulders in standing for upright posture due to ataxia. Pt performs sit to stand with CGA from stedy seat height, tolerates standing x 25 sec at most. Pt left seated in w/c in room with needs in reach at end of therapy session, hot pack to shoulders, wife present.   Therapy Documentation Precautions:  Precautions Precautions: Fall, Cervical Precaution Comments: No  brace Restrictions Weight Bearing Restrictions: No   Therapy/Group: Individual Therapy  Peter Congo, PT, DPT  03/20/2018, 12:04 PM

## 2018-03-20 NOTE — Patient Care Conference (Signed)
Inpatient RehabilitationTeam Conference and Plan of Care Update Date: 03/19/2018   Time: 2:15 PM    Patient Name: Wayne Buckley      Medical Record Number: [id number]  Date of Birth: 10/08/1950 Sex: Male         Room/Bed: 4W02C/4W02C-01 Payor Info: Payor: BLUE CROSS BLUE SHIELD / Plan: BCBS OTHER / Product Type: *No Product type* /    Admitting Diagnosis: ACDF  Admit Date/Time:  03/05/2018  2:54 PM Admission Comments: No comment available   Primary Diagnosis:  <principal problem not specified> Principal Problem: <principal problem not specified>  Patient Active Problem List   Diagnosis Date Noted  . Cord compression (HCC) 03/05/2018  . C5-C7 level spinal cord injury (HCC)   . Central cord syndrome (HCC)   . Neurogenic bladder   . Essential hypertension   . AKI (acute kidney injury) (HCC) 02/26/2018  . Bilateral leg weakness 02/26/2018  . Leg DVT (deep venous thromboembolism), acute, left (HCC) 02/26/2018  . Fall 02/25/2018  . Alcohol abuse 05/04/2012  . Hypokalemia 05/04/2012  . Urinary retention 05/04/2012  . Subdural hematoma, post-traumatic (HCC) 04/28/2012    Expected Discharge Date: Expected Discharge Date: 04/02/18  Team Members Present: Physician leading conference: Dr. Faith Rogue Social Worker Present: [name], LCSW Nurse Present: Other (comment)(Latoya Marciano Sequin, LPN) PT Present: Teodoro Kil, PT OT Present: Callie Fielding, OT PPS Coordinator present : Tora Duck, RN, CRRN     Current Status/Progress Goal Weekly Team Focus  Medical   slow therapy progress. neurogenic bowel and bladder with inconsistent buy in, TBI/behavioral affecting  bowel and bladder program/compliance  see medical progress notes   Bowel/Bladder   Incontinent of bowel; unable to urinate; I&O cath q4H; LBM 03/19/18  Continent of bowel with min assist  Assess bowel and bladder needs q shift and PRN; offer toileting q hour while awake   Swallow/Nutrition/ Hydration              ADL's   Continues to require 2 helpers for slideboard transfers + Max A-2 helpers for LB self care bedlevel. Continues to be very self limiting and needs max encouragement to participate   Min A overall   Active therapy participation, UE/LE coordination, balance, functional transfers, endurance, strengthening, pt/family education    Mobility   still consistently +2 for transfers, though did transfer with as little as min assist on Tuesday, still needing maximum encouragement to partcipate  min assist overall, w/c level, may need to downgrade 2/2 inconsistency with peformance  balance, w/c propulsion, transfers, d/c planning   Communication             Safety/Cognition/ Behavioral Observations            Pain   No c/o pain  Pain </= 2/10  Assess pain q shift and PRN and medicate when necessary   Skin   Blister on left big toe, covered with foam; cervical incision in front of neck with steri strips in place; no other skin issues  Remain free if infection and breakdown  Assess skin q shift and PRN      *See Care Plan and progress notes for long and short-term goals.     Barriers to Discharge  Current Status/Progress Possible Resolutions Date Resolved   Physician    Medical stability;Neurogenic Bowel & Bladder        continued reinforcement and education      Nursing  PT                    OT Behavior                SLP                SW                Discharge Planning/Teaching Needs:  Plan for pt to d/c home with wife as primary caregiver, however, she is concerned about the amount of assistance he may need at d/c.  Wife remains very involved since last conference and recommendation that she be present for tx sessions when she is able.   Team Discussion:  Limited progress this week likely r/t past TBI/ behavioral component.  Still resistant to caths and keeps large volumes.  Poor ability to focus/ concentrate on what therapies are asking him to do.  Some  better this week with having wife here more often and frequent "pep talks".  Concern that, even with progress, pt will likely require more assistance than wife could truly provide at home - SW to discuss with pt/wife.  Possibility may need to change plan to SNF.  Revisions to Treatment Plan:  NA    Continued Need for Acute Rehabilitation Level of Care: The patient requires daily medical management by a physician with specialized training in physical medicine and rehabilitation for the following conditions: Daily direction of a multidisciplinary physical rehabilitation program to ensure safe treatment while eliciting the highest outcome that is of practical value to the patient.: Yes Daily medical management of patient stability for increased activity during participation in an intensive rehabilitation regime.: Yes Daily analysis of laboratory values and/or radiology reports with any subsequent need for medication adjustment of medical intervention for : Neurological problems;Mood/behavior problems   I attest that I was present, lead the team conference, and concur with the assessment and plan of the team.   Janelie Goltz 03/20/2018, 3:38 PM

## 2018-03-21 ENCOUNTER — Inpatient Hospital Stay (HOSPITAL_COMMUNITY): Payer: Medicare Other | Admitting: Occupational Therapy

## 2018-03-21 ENCOUNTER — Inpatient Hospital Stay (HOSPITAL_COMMUNITY): Payer: Medicare Other | Admitting: Physical Therapy

## 2018-03-21 ENCOUNTER — Inpatient Hospital Stay (HOSPITAL_COMMUNITY): Payer: Medicare Other

## 2018-03-21 NOTE — Progress Notes (Signed)
Occupational Therapy Session Note  Patient Details  Name: Wayne Buckley MRN: [id number] Date of Birth: 1951/06/01  Today's Date: 03/21/2018 OT Individual Time: 1030-1100 OT Individual Time Calculation (min): 30 min    Short Term Goals: Week 2:  OT Short Term Goal 1 (Week 2): Pt will perform toilet transfer with assist of 1. OT Short Term Goal 2 (Week 2): Pt will perform LB dressing with mod A from bed level with modifications as needed. OT Short Term Goal 3 (Week 2): Pt will demonstrate long sitting with min A from bed.  Skilled Therapeutic Interventions/Progress Updates:    Session focused on B UE coordination tasks during functional reaching and ADLs. Pt completed grooming tasks sitting in front of sink with vc for visual attention in mirror and for activity pacing. Pt was transported down to therapy gym where he completed B UE coordination task with a ball. Pt required intermittent manual A to not drop ball. Pt returned to room and was left sitting up with chair alarm belt fastened.   Therapy Documentation Precautions:  Precautions Precautions: Fall, Cervical Precaution Comments: No brace Restrictions Weight Bearing Restrictions: No Pain: Pain Assessment Pain Scale: 0-10 Pain Score: 0-No pain   Therapy/Group: Individual Therapy  Crissie Reese 03/21/2018, 12:13 PM

## 2018-03-21 NOTE — Progress Notes (Signed)
Occupational Therapy Session Note  Patient Details  Name: Wayne Buckley MRN: [id number] Date of Birth: Sep 10, 1950  Today's Date: 03/21/2018 OT Individual Time: 1308-6578 OT Individual Time Calculation (min): 46 min    Short Term Goals: Week 2:  OT Short Term Goal 1 (Week 2): Pt will perform toilet transfer with assist of 1. OT Short Term Goal 2 (Week 2): Pt will perform LB dressing with mod A from bed level with modifications as needed. OT Short Term Goal 3 (Week 2): Pt will demonstrate long sitting with min A from bed.  Skilled Therapeutic Interventions/Progress Updates:   Pt transferred from wheelchair to therapy mat with max assist to the right side for use of the sliding board.  Once on the mat focused session on sitting balance.  Pt with increased head tilt to the left side as well as lean.  He continually hooks the edge of the mat with his LUE and pulls himself to the left when attempting to have him complete small weightshifts to the right for better midline orientation.  Attempted to have pt complete lateral  reaching to the right for placement of clothespins with the RUE to increase weightshift.  He would place the clothespins with his RUE and then immediately place his hand on the bedside table and push up to get back to midline instead of using his core muscles.  Worked on upright sitting with therapist sitting behind him to give him support and to allow for him to relax, however choriec movements continue and pt cannot relax and isolate trunk or head movements.  He would continually sprawl out his arms and attempt to pull himself to the left with the LUE.  Worked on cervical extension to midline however pt would resist and report pain in his posterior neck.  Educated pt and spouse on the importance of head positioning in bed to help provide passive stretch to help decrease cervical protraction.  Pt completed transfer back to the wheelchair with max assist via sliding board.  He returned to  the room with spouse present.  Moist heat pack placed on left upper trap for pain relief.  Safety alarm belt in place as well.    Therapy Documentation Precautions:  Precautions Precautions: Fall, Cervical Precaution Comments: No brace Restrictions Weight Bearing Restrictions: No    Pain: Pain Assessment Pain Scale: Faces Faces Pain Scale: Hurts a little bit Pain Type: Acute pain Pain Location: Neck Pain Orientation: Posterior Pain Descriptors / Indicators: Aching;Discomfort Pain Onset: With Activity Pain Intervention(s): Repositioned;Heat applied ADL: Therapy/Group: Individual Therapy  Aleksandar Duve,Ezreal OTR/L 03/21/2018, 5:13 PM

## 2018-03-21 NOTE — Progress Notes (Signed)
[facility]ICAL [facility]IC[facility] & REHABILITATION     PROGRESS NOTE    Subjective/Complaints: Slept well. Denies pain  ROS: Patient denies fever, rash, sore throat, blurred vision, nausea, vomiting, diarrhea, cough, shortness of breath or chest pain, joint or back pain, headache, or mood change.   Objective:   No results found. No results for input(s): WBC, HGB, HCT, PLT in the last 72 hours. No results for input(s): NA, K, CL, GLUCOSE, BUN, CREATININE, CALCIUM in the last 72 hours.  Invalid input(s): CO CBG (last 3)  No results for input(s): GLUCAP in the last 72 hours.  Wt Readings from Last 3 Encounters:  03/20/18 101 kg  03/02/18 99.8 kg  08/07/12 88.2 kg     Intake/Output Summary (Last 24 hours) at 03/21/2018 0922 Last data filed at 03/21/2018 0852 Gross per 24 hour  Intake 1200 ml  Output 1825 ml  Net -625 ml    Vital Signs: Blood pressure 135/65, pulse 82, temperature 98.6 F (37 C), temperature source Oral, resp. rate 18, height 5\' 11"  (1.803 m), weight 101 kg, SpO2 97 %. Physical Exam:  Constitutional: No distress . Vital signs reviewed. HEENT: EOMI, oral membranes moist Neck: supple Cardiovascular: RRR without murmur. No JVD    Respiratory: CTA Bilaterally without wheezes or rales. Normal effort    GI: BS +, non-tender, non-distended  Left lower extremity with 1  + edema improving   Neurological: He isalertand oriented to person, place, and time.speech dysarthric speech Biceps and deltoids are 5 out of 5. triceps and HI remain 3 to 3+/5. Right lower extremity grossly 4 out of 5 proximal distal. LLE: 3- to 4-/5.--no changes   Sensory function remains decreased to light touch below the level of his injury.   Skin: Skin iswarm.  Psychiatric: pleasant  Assessment/Plan: 1. 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 Physiatrist  is providing close team supervision and 24 hour management of active medical problems listed below. Physiatrist and rehab team continue to assess barriers to discharge/monitor patient progress toward functional and medical goals.  Function:  Bathing Bathing position Bathing activity did not occur: Refused Position: Wheelchair/chair at sink  Bathing parts Body parts bathed by patient: Right arm, Left arm, Chest, Abdomen(UB only) Body parts bathed by helper: Right lower leg, Left lower leg, Back  Bathing assist Assist Level: Set up, Supervision or verbal cues      Upper Body Dressing/Undressing Upper body dressing Upper body dressing/undressing activity did not occur: Refused What is the patient wearing?: Pull over shirt/dress     Pull over shirt/dress - Perfomed by patient: Thread/unthread right sleeve, Thread/unthread left sleeve, Put head through opening Pull over shirt/dress - Perfomed by helper: Pull shirt over trunk        Upper body assist Assist Level: Touching or steadying assistance(Pt > 75%)      Lower Body Dressing/Undressing Lower body dressing Lower body dressing/undressing activity did not occur: Refused What is the patient wearing?: Pants, Ted Hose, Non-skid slipper socks       Pants- Performed by helper: Thread/unthread right pants leg, Thread/unthread left pants leg, Pull pants up/down(pt A with pulling up pants 50%)   Non-skid slipper socks- Performed by helper: Don/doff right sock, Don/doff left sock               TED Hose - Performed by helper: Don/doff right TED hose, Don/doff left TED hose  Lower body assist  Assist for lower body dressing: (Total A)      Toileting Toileting Toileting activity did not occur: Refused   Toileting steps completed by helper: Adjust clothing after toileting    Toileting assist Assist level: Two helpers   Transfers Chair/bed transfer   Chair/bed transfer method: Lateral scoot Chair/bed transfer assist level: 2  helpers Chair/bed transfer assistive device: Sliding board, Armrests Mechanical lift: Maximove   Locomotion Ambulation Ambulation activity did not occur: Safety/medical Investment banker, operational activity did not occur: Safety/medical concerns Type: Manual Max wheelchair distance: 20 Assist Level: Supervision or verbal cues  Cognition Comprehension Comprehension assist level: Understands basic 75 - 89% of the time/ requires cueing 10 - 24% of the time  Expression Expression assist level: Expresses basic 50 - 74% of the time/requires cueing 25 - 49% of the time. Needs to repeat parts of sentences.  Social Interaction Social Interaction assist level: Interacts appropriately 75 - 89% of the time - Needs redirection for appropriate language or to initiate interaction.  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 Pt needs constant encourage due to behavioral and cognitive issues 2. DVT Prophylaxis/Anticoagulation: Extensive left lower extremity DVT status post IVC filter 02/27/2018.  no  bleeding on Eliquis  -hgb11.9  9/23---11.6 9/25---11.8 9/26  hgb holding at 11.3 9/30 3. Pain Management:Tylenol as needed 4. Mood:Provide emotional support 5. Neuropsych: This patientiscapable of making decisions on hisown behalf. 6. Skin/Wound Care:Routine skin checks 7. Fluids/Electrolytes/Nutrition:  -encourage PO     8.Leukocytosis.     -Urine study negative   -resolved 9.Hypertension. HCTZ 25 mg daily, Coreg 12.5mg  twice  daily, Norvasc 10 mg daily,hydralazine 100 mg every 8 hours.   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     --increased urecholine to 25mg  TID without much success so far   -pt with little insight into process   -continue current plan 11.neurogenic bowel: -PM bowel program--working on consistency   -education provided again 12.History of alcohol use. Provide counseling  LOS (Days) 16 A FACE TO FACE EVALUATION WAS PERFORMED  Ranelle Oyster, MD 03/21/2018 9:22 AM

## 2018-03-21 NOTE — Progress Notes (Addendum)
Physical Therapy Session Note  Patient Details  Name: Wayne Buckley MRN: [id number] Date of Birth: 28-Jun-1950  Today's Date: 03/21/2018 PT Individual Time: 0900-1000; 1300-1400 PT Individual Time Calculation (min): 60 min and 60 min  Short Term Goals: Week 2:  PT Short Term Goal 1 (Week 2): Pt will transfer with slide board and +1 assist PT Short Term Goal 2 (Week 2): Pt will propel w/c 9' with supervision PT Short Term Goal 3 (Week 2): Pt will tolerate supported standing x5 minutes for weight bearing and activity tolerance  Skilled Therapeutic Interventions/Progress Updates:    Tx 1: Pt received supine in bed, agreeable to PT. Pt reports some soreness in upper traps, addressed pain with hot pack to upper back at end of therapy session. Assisted pt with donning shirt and pants at bed level with max A. Bed mobility mod A. Sliding board transfer bed to w/c with min A to the R. Sliding board transfer w/c to/from mat table min A to the R. Seated balance EOM focus on leaning L/R onto pillow and then returning to midline. Pt requires mod verbal and manual cues for full weight shift onto UE onto mat, x 5 reps each direction. Pt left seated in w/c in room with needs in reach, quick-release belt in place, wife present at end of therapy session.  Tx 2: Pt received seated in w/c in room, agreeable to PT. No complaints of pain. Anterior leans onto mat table from w/c with focus on activation of core musculature reaching for horseshoes and pushing yellow therapy ball. Sliding board transfer w/c to mat table with min A to the R, max cueing for hand placement and anterior weight shift during transfer. Sitting balance EOM reaching for horseshoes with min A to maintain balance, focus on anterior lean and return to upright position. Attempt seated 1# dowel punch-outs, pt reports onset of R shoulder pain so activity deferred. Seated anterior leans onto blue therapy ball with min to mod A to maintain sitting balance  EOM, focus on decreased reliance on BUE on mat table for support and to keep hands on therapy ball. Sliding board transfer mat table to w/c with mod A to the L. Manual w/c propulsion x 50 ft with BUE and Supervision, max cues for encouragement and technique. Pt left seated in w/c in room with needs in reach, wife present, in care of NA.  Therapy Documentation Precautions:  Precautions Precautions: Fall, Cervical Precaution Comments: No brace Restrictions Weight Bearing Restrictions: No   Therapy/Group: Individual Therapy  Peter Congo, PT, DPT  03/21/2018, 11:57 AM

## 2018-03-22 ENCOUNTER — Inpatient Hospital Stay (HOSPITAL_COMMUNITY): Payer: Medicare Other | Admitting: Physical Therapy

## 2018-03-22 ENCOUNTER — Inpatient Hospital Stay (HOSPITAL_COMMUNITY): Payer: Medicare Other | Admitting: Occupational Therapy

## 2018-03-22 NOTE — Progress Notes (Signed)
Physical Therapy Weekly Progress Note  Patient Details  Name: Wayne Buckley MRN: [id number] Date of Birth: 01-May-1951  Beginning of progress report period: March 14, 2018 End of progress report period: March 22, 2018  Today's Date: 03/22/2018 PT Individual Time: 1300-1357 PT Individual Time Calculation (min): 57 min   Patient has met 4 of 6 short term goals.    Patient continues to demonstrate the following deficits muscle weakness, decreased cardiorespiratoy endurance, impaired timing and sequencing, decreased coordination and decreased motor planning and decreased sitting balance, decreased standing balance, decreased postural control and decreased balance strategies and therefore will continue to benefit from skilled PT intervention to increase functional independence with mobility.  Patient progressing toward long term goals..  Continue plan of care.  PT Short Term Goals Week 1:  PT Short Term Goal 1 (Week 1): Pt will demonstrate bed mobility with mod assist using bed features. PT Short Term Goal 1 - Progress (Week 1): Progressing toward goal PT Short Term Goal 2 (Week 1): Pt will transition sit<>stand with +2 assist and without power lift equipment.  PT Short Term Goal 2 - Progress (Week 1): Met PT Short Term Goal 3 (Week 1): Pt will propel w/c 25' with min assist.  PT Short Term Goal 3 - Progress (Week 1): Met Week 2:  PT Short Term Goal 1 (Week 2): Pt will transfer with slide board and +1 assist PT Short Term Goal 1 - Progress (Week 2): Met PT Short Term Goal 2 (Week 2): Pt will propel w/c 60' with supervision PT Short Term Goal 2 - Progress (Week 2): Met PT Short Term Goal 3 (Week 2): Pt will tolerate supported standing x5 minutes for weight bearing and activity tolerance PT Short Term Goal 3 - Progress (Week 2): Progressing toward goal Week 3:  PT Short Term Goal 1 (Week 3): Patient will complete sliding board transfers with Min guard of 1  PT Short Term Goal 2 (Week 3):  Patient will tolerate supported standing for 5 minutes for improved weight bearing and activity tolerance  PT Short Term Goal 3 (Week 3): Patient will initiate pre-gait activities including weight shifting in standing  PT Short Term Goal 4 (Week 3): Patient will initiate ambulation with assist of +2   Skilled Therapeutic Interventions/Progress Updates:    Patient received up in [facility], very pleasant and willing to participate in PT today. Able to self-propel WC approximately 52f before becoming fatigued, and then was transported to the gym totalA due to fatigue. Able to transfer with sliding board to the right with MinA, and participated in seated balance reaching and problem solving activities with min guard today, required one supine rest break with maxA for sit to supine and supine to sit due to fatigue. Also participated in tossing weighted bean bags towards basketball net also for balance strategies and UE endurance. [facility]and Mod cues for technique, hand placement, anterior weight shift. Performed UE strengthening with weighted dowels with back unsupported in WC, able to then self-propel WC approximately 242flimited by fatigue and was [facility] met, quick release lap belt in place.   Therapy Documentation Precautions:  Precautions Precautions: Fall, Cervical Precaution Comments: No brace Restrictions Weight Bearing Restrictions: No General:   Vital Signs:  Pain: Pain Assessment Pain Scale: 0-10 Pain Score: 0-No pain Faces Pain Scale: No hurt    Therapy/Group: Individual Therapy  Deniece Ree PT, DPT, CBIS  Supplemental Physical Therapist [facility]    Pager [phone] Acute Rehab Office [phone]   03/22/2018, 4:06 PM

## 2018-03-22 NOTE — Progress Notes (Signed)
Occupational Therapy Session Note  Patient Details  Name: Wayne Buckley MRN: [id number] Date of Birth: [birth year]  Today's Date: 03/22/2018 OT Individual Time: 7253-6644 and 0347-4259 OT Individual Time Calculation (min): 72 min and 59 min  Skilled Therapeutic Interventions/Progress Updates:    Pt greeted in bed with NT. Had just bathed and had brief changed. Worked on adaptive dressing skills bedlevel with use of bed controls. With increased time and cuing, pt able to thread both LEs into pants. He rolled Rt>Lt with Mod A and was able to elevate pants over hips himself. Pt then donned gripper socks with assist to place L LE into figure 4 only! He required max vcs to correct his posture throughout due to Lt lean. Pt able to pull with bedrails on Rt to self correct postural alignment. Afterwards he transitioned to EOB with Mod A. Donned shirt with supervision for sitting balance. Slideboard<w/c completed with Mod-Max A to Rt side. While seated, pt lathered hair with use of shower cap, and blow-dryed/combed hair with setup. He also completed face washing, oral care, and washed his hands with supervision/cuing for thoroughness and cleaning up. At end of session pt was left in w/c with all needs within reach, spouse present, and safety belt fastened.   2nd Session 1:1 tx (59 min) Pt greeted in w/c with no c/o pain. Started session by addressing standing tolerance and trunk control using standing frame. When pt supported himself with hands on table, he exhibited truncal extension. Unable to correct with cuing. Had him lower to elbows to improve upright alignment and control. Used a Ship broker to work on midline orientation due to [employer]. Initially had pt engage in card sorting task while standing, however removed activity due to regressing postural control. He was able to stand for 3 minute windows. Afterwards took pt to [employer] ramp. Focused on UB strengthening by having pt propel w/c. After three or so  pumps, he'd stop to rest. Emphasized that pt take his time and do as much as he could to build strength and improve frustration tolerance. Pt self propelled 395 ft in total with max encouragement. Max cues for pathfinding his way back to unit. Slideboard<bed completed with Mod A(!). He was then left in bed with RN present and all needs.    Therapy Documentation Precautions:  Precautions Precautions: Fall, Cervical Precaution Comments: No brace Restrictions Weight Bearing Restrictions: No Pain: Pt denies pain at rest   ADL:       Therapy/Group: Individual Therapy  Delano Frate A Terryn Rosenkranz 03/22/2018, 12:11 PM

## 2018-03-22 NOTE — Progress Notes (Signed)
[facility] PHYSICAL MEDICINE & REHABILITATION     PROGRESS NOTE    Subjective/Complaints: No new complaints. Again slept well. Pain seems controlled  ROS: Patient denies fever, rash, sore throat, blurred vision, nausea, vomiting, diarrhea, cough, shortness of breath or chest pain,   headache, or mood change.    Objective:   No results found. No results for input(s): WBC, HGB, HCT, PLT in the last 72 hours. No results for input(s): NA, K, CL, GLUCOSE, BUN, CREATININE, CALCIUM in the last 72 hours.  Invalid input(s): CO CBG (last 3)  No results for input(s): GLUCAP in the last 72 hours.  Wt Readings from Last 3 Encounters:  03/20/18 101 kg  03/02/18 99.8 kg  08/07/12 88.2 kg     Intake/Output Summary (Last 24 hours) at 03/22/2018 1010 Last data filed at 03/22/2018 0900 Gross per 24 hour  Intake 600 ml  Output 1825 ml  Net -1225 ml    Vital Signs: Blood pressure (!) 120/99, pulse 83, temperature 98.4 F (36.9 C), temperature source Oral, resp. rate 12, height 5\' 11"  (1.803 m), weight 101 kg, SpO2 98 %. Physical Exam:  Constitutional: No distress . Vital signs reviewed. HEENT: EOMI, oral membranes moist Neck: supple Cardiovascular: RRR without murmur. No JVD    Respiratory: CTA Bilaterally without wheezes or rales. Normal effort    GI: BS +, non-tender, non-distended  Left lower extremity with 1  + edema essentially unchanged Neurological: He isalertand oriented to person, place, and time.speech dysarthric speech Biceps and deltoids are 5 out of 5. triceps and HI remain 3 to 3+/5. Right lower extremity grossly 4 out of 5 proximal distal. LLE: 3- to 4-/5.--motor exam unchanged   Sensory function remains decreased to light touch below the level of his injury.   Skin: Skin iswarm.  Psychiatric: Pleasant and cooperative  Assessment/Plan: 1. 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 Physiatrist is providing close team supervision and 24 hour management of active medical problems listed below. Physiatrist and rehab team continue to assess barriers to discharge/monitor patient progress toward functional and medical goals.  Function:  Bathing Bathing position Bathing activity did not occur: Refused Position: Wheelchair/chair at sink  Bathing parts Body parts bathed by patient: Right arm, Left arm, Chest, Abdomen(UB only) Body parts bathed by helper: Right lower leg, Left lower leg, Back  Bathing assist Assist Level: Set up, Supervision or verbal cues      Upper Body Dressing/Undressing Upper body dressing Upper body dressing/undressing activity did not occur: Refused What is the patient wearing?: Pull over shirt/dress     Pull over shirt/dress - Perfomed by patient: Thread/unthread right sleeve, Thread/unthread left sleeve, Put head through opening Pull over shirt/dress - Perfomed by helper: Pull shirt over trunk        Upper body assist Assist Level: Touching or steadying assistance(Pt > 75%)      Lower Body Dressing/Undressing Lower body dressing Lower body dressing/undressing activity did not occur: Refused What is the patient wearing?: Pants, Ted Hose, Non-skid slipper socks       Pants- Performed by helper: Thread/unthread right pants leg, Thread/unthread left pants leg, Pull pants up/down(pt A with pulling up pants 50%)   Non-skid slipper socks- Performed by helper: Don/doff right sock, Don/doff left sock               TED Hose - Performed by helper: Don/doff right TED hose, Don/doff  left TED hose  Lower body assist Assist for lower body dressing: (Total A)      Toileting Toileting Toileting activity did not occur: Refused   Toileting steps completed by helper: Adjust clothing after toileting    Toileting assist Assist level: Two helpers   Transfers Chair/bed transfer   Chair/bed  transfer method: Lateral scoot Chair/bed transfer assist level: 2 helpers Chair/bed transfer assistive device: Sliding board, Armrests Mechanical lift: Maximove   Locomotion Ambulation Ambulation activity did not occur: Safety/medical Investment banker, operational activity did not occur: Safety/medical concerns Type: Manual Max wheelchair distance: 20 Assist Level: Supervision or verbal cues  Cognition Comprehension Comprehension assist level: Understands basic 75 - 89% of the time/ requires cueing 10 - 24% of the time  Expression Expression assist level: Expresses basic 50 - 74% of the time/requires cueing 25 - 49% of the time. Needs to repeat parts of sentences.  Social Interaction Social Interaction assist level: Interacts appropriately 75 - 89% of the time - Needs redirection for appropriate language or to initiate interaction.  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 DVT Prophylaxis/Anticoagulation: Extensive left lower extremity DVT status post IVC filter 02/27/2018.  no  bleeding on Eliquis  -hgb11.9  9/23---11.6 9/25---11.8 9/26  hgb holding at 11.3 9/30  -recheck labs Monday 3. Pain Management:Tylenol as needed 4. Mood:Provide emotional support 5. Neuropsych: This patientiscapable of making decisions on hisown behalf. 6. Skin/Wound Care:Routine skin checks 7. Fluids/Electrolytes/Nutrition:  -encourage PO    -follow up labs Monday 8.Leukocytosis.     -Urine study negative    -resolved 9.Hypertension. HCTZ 25 mg daily, Coreg 12.5mg  twice daily, Norvasc 10 mg daily,hydralazine 100 mg every 8 hours.   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     --increased urecholine to 25mg  TID without much success so far   -pt with little insight into process   -continue current plan 11.neurogenic bowel: -PM bowel program--working on consistency   -education provided again 12.History of alcohol use. Provide counseling  LOS (Days) 17 A FACE TO FACE EVALUATION WAS PERFORMED  Ranelle Oyster, MD 03/22/2018 10:10 AM

## 2018-03-23 DIAGNOSIS — D62 Acute posthemorrhagic anemia: Secondary | ICD-10-CM

## 2018-03-23 NOTE — Plan of Care (Signed)
POC adjusted 03/23/18

## 2018-03-23 NOTE — Progress Notes (Signed)
[facility] PHYSICAL MEDICINE & REHABILITATION     PROGRESS NOTE    Subjective/Complaints: Patient seen lying in bed this morning.  He states he slept well overnight.  ROS: Denies CP, SOB, N/V/D  Objective:   No results found. No results for input(s): WBC, HGB, HCT, PLT in the last 72 hours. No results for input(s): NA, K, CL, GLUCOSE, BUN, CREATININE, CALCIUM in the last 72 hours.  Invalid input(s): CO CBG (last 3)  No results for input(s): GLUCAP in the last 72 hours.  Wt Readings from Last 3 Encounters:  03/20/18 101 kg  03/02/18 99.8 kg  08/07/12 88.2 kg     Intake/Output Summary (Last 24 hours) at 03/23/2018 0744 Last data filed at 03/23/2018 0547 Gross per 24 hour  Intake 480 ml  Output 2525 ml  Net -2045 ml    Vital Signs: Blood pressure 122/74, pulse 69, temperature 98.3 F (36.8 C), temperature source Oral, resp. rate 18, height 5\' 11"  (1.803 m), weight 101 kg, SpO2 100 %. Physical Exam:  Constitutional: No distress . Vital signs reviewed. HENT: Normocephalic.  Atraumatic. Eyes: EOMI. No discharge. Cardiovascular: RRR. No JVD. Respiratory: CTA Bilaterally. Normal effort. GI: BS +. Non-distended. Musc: Left lower extremity edema  Neurological: He isalertand oriented Dysarthric speech Motor: B/l UE 5 proximally, 3+/5 distally.  Right lower extremity 4/5 proximal distal.  LLE: 4-/5 proximal to distal Ataxia versus weakness bilateral lower extremities ?  Dyskinetic movements Skin: Skin iswarm.  Intact. Psychiatric:Pleasant and cooperative  Assessment/Plan: 1. 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 Physiatrist is providing close team supervision and 24 hour management of active medical problems listed below. Physiatrist and rehab team continue to assess barriers to discharge/monitor patient progress toward functional and  medical goals.  Function:  Bathing Bathing position Bathing activity did not occur: Refused Position: Wheelchair/chair at sink  Bathing parts Body parts bathed by patient: Right arm, Left arm, Chest, Abdomen(UB only) Body parts bathed by helper: Right lower leg, Left lower leg, Back  Bathing assist Assist Level: Set up, Supervision or verbal cues      Upper Body Dressing/Undressing Upper body dressing Upper body dressing/undressing activity did not occur: Refused What is the patient wearing?: Pull over shirt/dress     Pull over shirt/dress - Perfomed by patient: Thread/unthread right sleeve, Thread/unthread left sleeve, Put head through opening Pull over shirt/dress - Perfomed by helper: Pull shirt over trunk        Upper body assist Assist Level: Touching or steadying assistance(Pt > 75%)      Lower Body Dressing/Undressing Lower body dressing Lower body dressing/undressing activity did not occur: Refused What is the patient wearing?: Pants, Ted Hose, Non-skid slipper socks       Pants- Performed by helper: Thread/unthread right pants leg, Thread/unthread left pants leg, Pull pants up/down(pt A with pulling up pants 50%)   Non-skid slipper socks- Performed by helper: Don/doff right sock, Don/doff left sock               TED Hose - Performed by helper: Don/doff right TED hose, Don/doff left TED hose  Lower body assist Assist for lower body dressing: (Total A)      Toileting Toileting Toileting activity did not occur: Refused   Toileting steps completed by helper: Adjust clothing after toileting    Toileting assist Assist level: Two helpers   Transfers Chair/bed transfer   Chair/bed transfer method: Lateral  scoot Chair/bed transfer assist level: 2 helpers Chair/bed transfer assistive device: Sliding board, Armrests Mechanical lift: Maximove   Locomotion Ambulation Ambulation activity did not occur: Safety/medical Investment banker, operational  activity did not occur: Safety/medical concerns Type: Manual Max wheelchair distance: 20 Assist Level: Supervision or verbal cues  Cognition Comprehension Comprehension assist level: Understands basic 75 - 89% of the time/ requires cueing 10 - 24% of the time  Expression Expression assist level: Expresses basic 50 - 74% of the time/requires cueing 25 - 49% of the time. Needs to repeat parts of sentences.  Social Interaction Social Interaction assist level: Interacts appropriately 75 - 89% of the time - Needs redirection for appropriate language or to initiate interaction.  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 DVT Prophylaxis/Anticoagulation: Extensive left lower extremity DVT status post IVC filter 02/27/2018. No  bleeding on Eliquis  Hemoglobin 11.3 on 9/30  -recheck labs Monday 3. Pain Management:Tylenol as needed 4. Mood:Provide emotional support 5. Neuropsych: This patientiscapable of making decisions on hisown behalf. 6. Skin/Wound Care:Routine skin checks 7. Fluids/Electrolytes/Nutrition:  -encourage PO   BMP within acceptable range, except for mild hyponatremia on 9/30  Follow up labs Monday 8.Leukocytosis.  Resolved  -Urine study negative 9.Hypertension. HCTZ 25 mg daily, Coreg 12.5mg  twice daily, Norvasc 10 mg daily,hydralazine 100 mg every 8  hours.   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     --increased urecholine to 25mg  TID   -pt with little insight into process   -continue current plan 11.neurogenic bowel: -PM bowel program--working on consistency   -education has been provided  12.History of alcohol use. Attempt to provide counseling  LOS (Days) 18 A FACE TO FACE EVALUATION WAS PERFORMED  Ankit Karis Juba, MD 03/23/2018 7:44 AM

## 2018-03-23 NOTE — Progress Notes (Signed)
Occupational Therapy Weekly Progress Note  Patient Details  Name: Wayne Buckley MRN: [id number] Date of Birth: 10-Nov-1950  Beginning of progress report period: 03/14/18 End of progress report period: 03/23/18   Patient has met 1 of 3 short term goals.    Pt is presently making slow progress towards LTG achievement. His participation during tx sessions has slightly improved, but pt continues to be self limiting and often refuses to engage in certain interventions (I.e. toilet transfers). He has exhibited the ability to complete LB dressing with Mod A bedlevel, however ADL levels are inconsistent and fluctuate depending on affect and fatigue level. His spouse has often been present during OT to assist with increasing motivation. Pt to d/c SNF or home depending on his functional progress.   Patient continues to demonstrate the following deficits: muscle weakness, decreased cardiorespiratoy endurance, unbalanced muscle activation, ataxia and decreased coordination, decreased initiation, decreased attention, decreased awareness, decreased problem solving, decreased safety awareness and decreased memory and decreased sitting balance, decreased standing balance and decreased postural control and therefore will continue to benefit from skilled OT intervention to enhance overall performance with BADL.  Patient progressing toward long term goals..  Continue plan of care.  OT Short Term Goals Week 2:  OT Short Term Goal 1 (Week 2): Pt will perform toilet transfer with assist of 1. OT Short Term Goal 1 - Progress (Week 2): Not met(due to pt refusing to try toileting) OT Short Term Goal 2 (Week 2): Pt will perform LB dressing with mod A from bed level with modifications as needed. OT Short Term Goal 2 - Progress (Week 2): Met OT Short Term Goal 3 (Week 2): Pt will demonstrate long sitting with min A from bed. OT Short Term Goal 3 - Progress (Week 2): Not met Week 3:  OT Short Term Goal 1 (Week 3): STGs=LTGs  due to ELOS  Therapy Documentation Precautions:  Precautions Precautions: Fall, Cervical Precaution Comments: No brace Restrictions Weight Bearing Restrictions: No Pain: Pain Assessment Pain Scale: 0-10 Pain Score: 0-No pain ADL:      Therapy/Group: Individual Therapy  Wayne Buckley A Wayne Buckley 03/23/2018, 12:44 PM

## 2018-03-24 ENCOUNTER — Inpatient Hospital Stay (HOSPITAL_COMMUNITY): Payer: Medicare Other

## 2018-03-24 NOTE — Progress Notes (Signed)
Wayne Buckley PHYSICAL MEDICINE & REHABILITATION     PROGRESS NOTE    Subjective/Complaints: Patient seen lying in bed this morning.  He states he slept well overnight.  He states he is still sleepy.  ROS: Denies CP, SOB, N/V/D  Objective:   No results found. No results for input(s): WBC, HGB, HCT, PLT in the last 72 hours. No results for input(s): NA, K, CL, GLUCOSE, BUN, CREATININE, CALCIUM in the last 72 hours.  Invalid input(s): CO CBG (last 3)  No results for input(s): GLUCAP in the last 72 hours.  Wt Readings from Last 3 Encounters:  03/20/18 101 kg  03/02/18 99.8 kg  08/07/12 88.2 kg     Intake/Output Summary (Last 24 hours) at 03/24/2018 0713 Last data filed at 03/24/2018 0300 Gross per 24 hour  Intake 840 ml  Output 2601 ml  Net -1761 ml    Vital Signs: Blood pressure 124/80, pulse 84, temperature 98.9 F (37.2 C), resp. rate 18, height 5\' 11"  (1.803 m), weight 101 kg, SpO2 95 %. Physical Exam:  Constitutional: No distress . Vital signs reviewed. HENT: Normocephalic.  Atraumatic. Eyes: EOMI. No discharge. Cardiovascular: RRR.  No JVD. Respiratory: CTA bilaterally.  Normal effort. GI: BS +. Non-distended. Musc: Left lower extremity edema, stable Neurological: He isalertand oriented Dysarthric speech Motor: B/l UE 5 proximally, 3+/5 distally, stable.  Right lower extremity 4/5 proximal distal, stable.  LLE: 4-/5 proximal to distal Ataxia versus weakness bilateral lower extremities ?  Dyskinetic movements Skin: Skin iswarm.  Intact. Psychiatric:Pleasant and cooperative  Assessment/Plan: 1. 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 Physiatrist is providing close team supervision and 24 hour management of active medical problems listed below. Physiatrist and rehab team continue to assess barriers to discharge/monitor patient  progress toward functional and medical goals.  Function:  Bathing Bathing position Bathing activity did not occur: Refused Position: Wheelchair/chair at sink  Bathing parts Body parts bathed by patient: Right arm, Left arm, Chest, Abdomen(UB only) Body parts bathed by helper: Right lower leg, Left lower leg, Back  Bathing assist Assist Level: Set up, Supervision or verbal cues      Upper Body Dressing/Undressing Upper body dressing Upper body dressing/undressing activity did not occur: Refused What is the patient wearing?: Pull over shirt/dress     Pull over shirt/dress - Perfomed by patient: Thread/unthread right sleeve, Thread/unthread left sleeve, Put head through opening Pull over shirt/dress - Perfomed by helper: Pull shirt over trunk        Upper body assist Assist Level: Touching or steadying assistance(Pt > 75%)      Lower Body Dressing/Undressing Lower body dressing Lower body dressing/undressing activity did not occur: Refused What is the patient wearing?: Pants, Ted Hose, Non-skid slipper socks       Pants- Performed by helper: Thread/unthread right pants leg, Thread/unthread left pants leg, Pull pants up/down(pt A with pulling up pants 50%)   Non-skid slipper socks- Performed by helper: Don/doff right sock, Don/doff left sock               TED Hose - Performed by helper: Don/doff right TED hose, Don/doff left TED hose  Lower body assist Assist for lower body dressing: (Total A)      Toileting Toileting Toileting activity did not occur: Refused   Toileting steps completed by helper: Adjust clothing after toileting    Toileting assist Assist level: Two helpers   Transfers  Chair/bed transfer   Chair/bed transfer method: Lateral scoot Chair/bed transfer assist level: 2 helpers Chair/bed transfer assistive device: Sliding board, Armrests Mechanical lift: Maximove   Locomotion Ambulation Ambulation activity did not occur: Safety/medical Armed forces technical officer activity did not occur: Safety/medical concerns Type: Manual Max wheelchair distance: 20 Assist Level: Supervision or verbal cues  Cognition Comprehension Comprehension assist level: Understands basic 75 - 89% of the time/ requires cueing 10 - 24% of the time  Expression Expression assist level: Expresses basic 50 - 74% of the time/requires cueing 25 - 49% of the time. Needs to repeat parts of sentences.  Social Interaction Social Interaction assist level: Interacts appropriately 75 - 89% of the time - Needs redirection for appropriate language or to initiate interaction.  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 DVT Prophylaxis/Anticoagulation: Extensive left lower extremity DVT status post IVC filter 02/27/2018. No  bleeding on Eliquis  Hemoglobin 11.3 on 9/30  -recheck labs tomorrow 3. Pain Management:Tylenol as needed 4. Mood:Provide emotional support 5. Neuropsych: This patientiscapable of making decisions on hisown behalf. 6. Skin/Wound Care:Routine skin checks 7. Fluids/Electrolytes/Nutrition:  -encourage PO   BMP within acceptable range, except for mild hyponatremia on 9/30  Follow up labs tomorrow 8.Leukocytosis.  Resolved  -Urine study negative 9.Hypertension. HCTZ 25 mg daily, Coreg 12.5mg  twice daily, Norvasc 10 mg daily,hydralazine 100 mg every 8 hours.   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     --increased urecholine to 25mg  TID   -pt with little insight into process   -continue current plan 11.neurogenic bowel: -PM bowel program--working on consistency   -education has been provided  12.History of alcohol use. Attempt to provide counseling  LOS (Days) 19 A FACE TO FACE EVALUATION WAS PERFORMED  Ankit Karis Juba, MD 03/24/2018 7:13 AM

## 2018-03-24 NOTE — Progress Notes (Signed)
Physical Therapy Session Note  Patient Details  Name: CORDAY WYKA MRN: [id number] Date of Birth: 1951-02-13  Today's Date: 03/24/2018 PT Individual Time: 1000-1055 PT Individual Time Calculation (min): 55 min   Short Term Goals: Week 3:  PT Short Term Goal 1 (Week 3): Patient will complete sliding board transfers with Min guard of 1  PT Short Term Goal 2 (Week 3): Patient will tolerate supported standing for 5 minutes for improved weight bearing and activity tolerance  PT Short Term Goal 3 (Week 3): Patient will initiate pre-gait activities including weight shifting in standing  PT Short Term Goal 4 (Week 3): Patient will initiate ambulation with assist of +2   Skilled Therapeutic Interventions/Progress Updates:    Pt supine in bed upon PT arrival, agreeable to therapy tx and denies pain. Pt performed rolling in both directions with min assist in order to don shorts. Pt transferred supine>sitting with mod assist and verbal cues for techniques. Pt transferred from bed>w/c with mod assist, lateral scoot transfer using slideboard. Pt propelled w/c from room>gym, bouts of 30-40 ft with rest breaks between secondary to self limitation. Pt performed x 2 slideboard transfers from w/c<>mat with min assist and increased time to complete, emphasis on techniques and head/hips relationship. Pt performed x 10 sit<>sidelying on elbow for core strengthening. Pt worked on dynamic sitting balance in order to reach for horseshoes off the ground and overhead, x 2 trails. Pt transferred back to w/c min assist with slideboard. Pt propelled w/c to the dayroom, bouts of 20-30 ft with supervision. Pt left in dayroom in care of wife and hand off for dance group.   Therapy Documentation Precautions:  Precautions Precautions: Fall, Cervical Precaution Comments: No brace Restrictions Weight Bearing Restrictions: No    Therapy/Group: Individual Therapy  Mindi Curling Schagen 03/24/2018, 7:59 AM

## 2018-03-25 ENCOUNTER — Inpatient Hospital Stay (HOSPITAL_COMMUNITY): Payer: Medicare Other | Admitting: Physical Therapy

## 2018-03-25 ENCOUNTER — Inpatient Hospital Stay (HOSPITAL_COMMUNITY): Payer: Medicare Other | Admitting: Occupational Therapy

## 2018-03-25 LAB — BASIC METABOLIC PANEL
ANION GAP: 5 (ref 5–15)
BUN: 12 mg/dL (ref 8–23)
CALCIUM: 8.3 mg/dL — AB (ref 8.9–10.3)
CO2: 27 mmol/L (ref 22–32)
CREATININE: 0.47 mg/dL — AB (ref 0.61–1.24)
Chloride: 101 mmol/L (ref 98–111)
Glucose, Bld: 97 mg/dL (ref 70–99)
Potassium: 3.1 mmol/L — ABNORMAL LOW (ref 3.5–5.1)
Sodium: 133 mmol/L — ABNORMAL LOW (ref 135–145)

## 2018-03-25 LAB — CBC
HEMATOCRIT: 31.9 % — AB (ref 39.0–52.0)
Hemoglobin: 10.4 g/dL — ABNORMAL LOW (ref 13.0–17.0)
MCH: 30.1 pg (ref 26.0–34.0)
MCHC: 32.6 g/dL (ref 30.0–36.0)
MCV: 92.2 fL (ref 78.0–100.0)
PLATELETS: 187 10*3/uL (ref 150–400)
RBC: 3.46 MIL/uL — ABNORMAL LOW (ref 4.22–5.81)
RDW: 14.6 % (ref 11.5–15.5)
WBC: 5.5 10*3/uL (ref 4.0–10.5)

## 2018-03-25 MED ORDER — BETHANECHOL CHLORIDE 25 MG PO TABS
25.0000 mg | ORAL_TABLET | Freq: Four times a day (QID) | ORAL | Status: DC
  Administered 2018-03-25 – 2018-03-31 (×27): 25 mg via ORAL
  Filled 2018-03-25 (×28): qty 1

## 2018-03-25 MED ORDER — POTASSIUM CHLORIDE CRYS ER 20 MEQ PO TBCR
20.0000 meq | EXTENDED_RELEASE_TABLET | Freq: Two times a day (BID) | ORAL | Status: DC
  Administered 2018-03-25 – 2018-04-04 (×21): 20 meq via ORAL
  Filled 2018-03-25 (×21): qty 1

## 2018-03-25 NOTE — Progress Notes (Signed)
Occupational Therapy Session Note  Patient Details  Name: Wayne Buckley MRN: [id number] Date of Birth: Sep 27, 1950  Today's Date: 03/25/2018 OT Individual Time: 4098-1191 OT Individual Time Calculation (min): 70 min   Short Term Goals: Week 3:  OT Short Term Goal 1 (Week 3): STGs=LTGs due to ELOS  Skilled Therapeutic Interventions/Progress Updates:    Pt greeted in bed with RN present, just finished with cathing and had brief changed. Worked on adaptive ADL skills, endurance, and active therapy participation during bathing and dressing completed EOB and bedlevel. Pt transitioned to EOB with Mod A for elevating trunk. UB bathing completed with cuing for thoroughness, with pt instructed to wash UB multiple times. Pt unable to tolerate figure 4 stretch for bilateral LEs to wash feet, and therefore OT washed lower legs. LB dressing completed bedlevel with significantly increased time and max calming cues for pt to complete at max level of independence. We relied heavily on hospital bed controls due to choreic movements/trunk control deficits. After pt threaded R LE into pants, OT positioned opening for L LE beside left foot, and pt able to guide foot in, and then pull himself up with bedrail to reach and pull shorts over thighs. He rolled Rt>Lt with Mod A and facilitation for LE positioning. Pt able to pull pants up completely with increased time and cues. Also able to utilize reclined figure 4 for donning Rt gripper sock. Assist required for Lt. He then completed slideboard transfer<w/c with Max A and max manual facilitation for forward weight shifting. Pt with truncal extension during recripricol scooting with locked w/c moving with him, and therefore w/c was placed against wall to keep it steady. He required Mod A and cuing for scooting back in w/c. Afterwards he completed grooming tasks/oral care w/c level with supervision and increased time. He lathered his hair up with use of shower cap, and then  blow-dryed hair with hair dryer. Pt shaved with supervision using electric razor. At end of session he remained up in w/c with safety belt fastened and all needs within reach.    Therapy Documentation Precautions:  Precautions Precautions: Fall, Cervical Precaution Comments: No brace Restrictions Weight Bearing Restrictions: No Pain: No c/o pain during session   Pain Assessment Pain Scale: 0-10 Pain Score: 0-No pain ADL:       Therapy/Group: Individual Therapy  Bryauna Byrum A Warnie Belair 03/25/2018, 12:32 PM

## 2018-03-25 NOTE — Progress Notes (Signed)
Physical Therapy Session Note  Patient Details  Name: Wayne Buckley MRN: [id number] Date of Birth: 07-May-1951  Today's Date: 03/25/2018 PT Individual Time: 1100-1200 PT Individual Time Calculation (min): 60 min   Short Term Goals: Week 3:  PT Short Term Goal 1 (Week 3): Patient will complete sliding board transfers with Min guard of 1  PT Short Term Goal 2 (Week 3): Patient will tolerate supported standing for 5 minutes for improved weight bearing and activity tolerance  PT Short Term Goal 3 (Week 3): Patient will initiate pre-gait activities including weight shifting in standing  PT Short Term Goal 4 (Week 3): Patient will initiate ambulation with assist of +2   Skilled Therapeutic Interventions/Progress Updates:    no c/o pain.  Pt intermittently c/o mild dizziness when asked to attempt a new task, but subsides with instructions to hold gaze forward instead of downward.  Session focus on NMR for postural control and LE coordination with sit<>stands, standing weight shifts, pre-gait, seated weight shifts, and stretching.    Pt propels w/c x50' with much more efficiency than noted in previous sessions.  Slide board transfer to the R x2 with total assist for set up of w/c and board, max cues for weight shifting away for board placement.  Lateral scoot across board with min assist on both attempts.  Sit<>stand x6 in // bars with mod assist, best attempt min assist, for NMR in standing.  First 2 trials completed standing with upright posture focus on trunk extension and L quad/glute activation; second 2 trials attempting alternating LE marches with therapist facilitating weight shift and blocking L knee with poor coordination noted; final 2 trials focus on simply weight shifting R<>L while maintaining LLE glute/quad activation and upright posture.  NMR in sitting for forward weight shifting with theraball and mod faciltiation for rolling ball forward and full upright posture on return to upright, 2x10  reps.  Sit<>supine on mat with mod assist for motor control and max multimodal cues for technique.  Extended cervical extension stretch to facilitate pt's ability to maximize upright posture/forward gaze with towel roll and occipital muscle release x3 minutes.  Pt returned to room at end of session and positioned upright in w/c with chair alarm intact, call bell in reach and needs met.   Therapy Documentation Precautions:  Precautions Precautions: Fall, Cervical Precaution Comments: No brace Restrictions Weight Bearing Restrictions: No    Therapy/Group: Individual Therapy  [name] 03/25/2018, 12:08 PM

## 2018-03-25 NOTE — Progress Notes (Signed)
Physical Therapy Session Note  Patient Details  Name: Wayne Buckley MRN: [id number] Date of Birth: December 05, 1950  Today's Date: 03/25/2018 PT Individual Time: 1345-1445 PT Individual Time Calculation (min): 60 min   Short Term Goals: Week 3:  PT Short Term Goal 1 (Week 3): Patient will complete sliding board transfers with Min guard of 1  PT Short Term Goal 2 (Week 3): Patient will tolerate supported standing for 5 minutes for improved weight bearing and activity tolerance  PT Short Term Goal 3 (Week 3): Patient will initiate pre-gait activities including weight shifting in standing  PT Short Term Goal 4 (Week 3): Patient will initiate ambulation with assist of +2   Skilled Therapeutic Interventions/Progress Updates:    no c/o pain.  Session focus on UE coordination and activity tolerance.    Boxing x3 trials (20s, 40s, 60s) for forward weight shift, UE coordination and overall activity tolerance with max encouragement to progress time with each trial.  PT applied 3# weights to BUEs for improved proprioception and coordination for remainder of session.  W/C propulsion with significant improvement in stroke technique, max distance 30' due to weighted UEs.  Cup stacking to R and L with BUEs focus on motor control and gradation of movement.  Fine motor task of moving small beads into cup with and without weights with equal performance each time.  W/C push ups with 3# weights 2x10 reaps with PT facilitation for weight bearing through LEs.  Pt returned to room at end of session and positioned upright with heat applied to neck, call bell in reach, and needs met.  Therapy Documentation Precautions:  Precautions Precautions: Fall, Cervical Precaution Comments: No brace Restrictions Weight Bearing Restrictions: No    Therapy/Group: Individual Therapy  [name] 03/25/2018, 2:46 PM

## 2018-03-25 NOTE — Progress Notes (Signed)
[place] PHYSICAL MEDICINE & REHABILITATION     PROGRESS NOTE    Subjective/Complaints: No new issues. Resting comfortably  ROS: Patient denies fever, rash, sore throat, blurred vision, nausea, vomiting, diarrhea, cough, shortness of breath or chest pain, joint or back pain, headache, or mood change.    Objective:   No results found. Recent Labs    03/25/18 0725  WBC 5.5  HGB 10.4*  HCT 31.9*  PLT 187   Recent Labs    03/25/18 0725  NA 133*  K 3.1*  CL 101  GLUCOSE 97  BUN 12  CREATININE 0.47*  CALCIUM 8.3*   CBG (last 3)  No results for input(s): GLUCAP in the last 72 hours.  Wt Readings from Last 3 Encounters:  03/20/18 101 kg  03/02/18 99.8 kg  08/07/12 88.2 kg     Intake/Output Summary (Last 24 hours) at 03/25/2018 0831 Last data filed at 03/25/2018 0100 Gross per 24 hour  Intake 600 ml  Output 1525 ml  Net -925 ml    Vital Signs: Blood pressure 122/63, pulse 85, temperature 98.5 F (36.9 C), temperature source Oral, resp. rate 17, height 5\' 11"  (1.803 m), weight 101 kg, SpO2 96 %. Physical Exam:  Constitutional: No distress . Vital signs reviewed. HEENT: EOMI, oral membranes moist Neck: supple Cardiovascular: RRR without murmur. No JVD    Respiratory: CTA Bilaterally without wheezes or rales. Normal effort    GI: BS +, non-tender, non-distended  Musc: Left lower extremity edema 1+, stable Neurological: He isalertand oriented Dysarthric speech Motor: B/l UE 5 proximally, 3+/5 distally, stable.  Right lower extremity 4/5 proximal distal, stable.  LLE: 4-/5 proximal to distal Ataxia versus weakness bilateral lower extremities persists Skin: Skin iswarm.  Intact. Psychiatric:Pleasant and cooperative  Assessment/Plan: 1. 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 Physiatrist is providing close team supervision and  24 hour management of active medical problems listed below. Physiatrist and rehab team continue to assess barriers to discharge/monitor patient progress toward functional and medical goals.  Function:  Bathing Bathing position Bathing activity did not occur: Refused Position: Wheelchair/chair at sink  Bathing parts Body parts bathed by patient: Right arm, Left arm, Chest, Abdomen(UB only) Body parts bathed by helper: Right lower leg, Left lower leg, Back  Bathing assist Assist Level: Set up, Supervision or verbal cues      Upper Body Dressing/Undressing Upper body dressing Upper body dressing/undressing activity did not occur: Refused What is the patient wearing?: Pull over shirt/dress     Pull over shirt/dress - Perfomed by patient: Thread/unthread right sleeve, Thread/unthread left sleeve, Put head through opening Pull over shirt/dress - Perfomed by helper: Pull shirt over trunk        Upper body assist Assist Level: Touching or steadying assistance(Pt > 75%)      Lower Body Dressing/Undressing Lower body dressing Lower body dressing/undressing activity did not occur: Refused What is the patient wearing?: Pants, Ted Hose, Non-skid slipper socks       Pants- Performed by helper: Thread/unthread right pants leg, Thread/unthread left pants leg, Pull pants up/down(pt A with pulling up pants 50%)   Non-skid slipper socks- Performed by helper: Don/doff right sock, Don/doff left sock               TED Hose - Performed by helper: Don/doff right TED hose, Don/doff left TED hose  Lower body assist Assist for lower body dressing: (  Total A)      Toileting Toileting Toileting activity did not occur: Refused   Toileting steps completed by helper: Adjust clothing after toileting    Toileting assist Assist level: Two helpers   Transfers Chair/bed transfer   Chair/bed transfer method: Lateral scoot Chair/bed transfer assist level: 2 helpers Chair/bed transfer assistive  device: Sliding board, Armrests Mechanical lift: Maximove   Locomotion Ambulation Ambulation activity did not occur: Safety/medical Investment banker, operational activity did not occur: Safety/medical concerns Type: Manual Max wheelchair distance: 20 Assist Level: Supervision or verbal cues  Cognition Comprehension Comprehension assist level: Understands basic 75 - 89% of the time/ requires cueing 10 - 24% of the time  Expression Expression assist level: Expresses basic 50 - 74% of the time/requires cueing 25 - 49% of the time. Needs to repeat parts of sentences.  Social Interaction Social Interaction assist level: Interacts appropriately 75 - 89% of the time - Needs redirection for appropriate language or to initiate interaction.  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 ELOS is currently 10/15 2. DVT Prophylaxis/Anticoagulation: Extensive left lower extremity DVT status post IVC filter 02/27/2018. No  bleeding on Eliquis  Hemoglobin 11.3 on 9/30--->10.4 today, no signs of blood loss  -recheck hgb again tomorrow to confirm stability 3. Pain Management:Tylenol as needed 4. Mood:Provide emotional support 5. Neuropsych: This patientiscapable of making decisions on hisown behalf. 6. Skin/Wound Care:Routine skin checks 7. Fluids/Electrolytes/Nutrition:  -encourage PO   -I personally reviewed the patient's labs today.    -replace potassium today (3.1) 8.Leukocytosis.  Resolved  -Urine study  negative 9.Hypertension. HCTZ 25 mg daily, Coreg 12.5mg  twice daily, Norvasc 10 mg daily,hydralazine 100 mg every 8 hours.   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  (somewhat improved)   --increased urecholine to 25mg  TID, will try QID schedule   -pt with little insight into process   -education re: I/O caths (?wife) 11.neurogenic bowel: -PM bowel program--some improvement in consistency   -education has been provided  12.History of alcohol use. Attempt to provide counseling   LOS (Days) 20 A FACE TO FACE EVALUATION WAS PERFORMED  Ranelle Oyster, MD 03/25/2018 8:31 AM

## 2018-03-25 NOTE — Plan of Care (Signed)
Goals d/c due to pt consistently refusing to participate in toileting tasks/transfers during OT 03/25/18

## 2018-03-26 ENCOUNTER — Inpatient Hospital Stay (HOSPITAL_COMMUNITY): Payer: Medicare Other | Admitting: Physical Therapy

## 2018-03-26 ENCOUNTER — Inpatient Hospital Stay (HOSPITAL_COMMUNITY): Payer: Medicare Other | Admitting: Occupational Therapy

## 2018-03-26 LAB — HEMOGLOBIN AND HEMATOCRIT, BLOOD
HCT: 32.6 % — ABNORMAL LOW (ref 39.0–52.0)
HEMOGLOBIN: 10.6 g/dL — AB (ref 13.0–17.0)

## 2018-03-26 NOTE — Progress Notes (Signed)
[place] PHYSICAL MEDICINE & REHABILITATION     PROGRESS NOTE    Subjective/Complaints: Pt lying in bed. No new complaints  ROS: Patient denies fever, rash, sore throat, blurred vision, nausea, vomiting, diarrhea, cough, shortness of breath or chest pain, joint or back pain, headache, or mood change.     Objective:   No results found. Recent Labs    03/25/18 0725 03/26/18 0401  WBC 5.5  --   HGB 10.4* 10.6*  HCT 31.9* 32.6*  PLT 187  --    Recent Labs    03/25/18 0725  NA 133*  K 3.1*  CL 101  GLUCOSE 97  BUN 12  CREATININE 0.47*  CALCIUM 8.3*   CBG (last 3)  No results for input(s): GLUCAP in the last 72 hours.  Wt Readings from Last 3 Encounters:  03/20/18 101 kg  03/02/18 99.8 kg  08/07/12 88.2 kg     Intake/Output Summary (Last 24 hours) at 03/26/2018 0827 Last data filed at 03/26/2018 0530 Gross per 24 hour  Intake 960 ml  Output 1751 ml  Net -791 ml    Vital Signs: Blood pressure 135/84, pulse 90, temperature 98 F (36.7 C), temperature source Oral, resp. rate 18, height 5\' 11"  (1.803 m), weight 101 kg, SpO2 95 %. Physical Exam:  Constitutional: No distress . Vital signs reviewed. HEENT: EOMI, oral membranes moist Neck: supple Cardiovascular: RRR without murmur. No JVD    Respiratory: CTA Bilaterally without wheezes or rales. Normal effort    GI: BS +, non-tender, non-distended  Musc: Left lower extremity edema 1+, stable Neurological: He isalertand oriented Dysarthric speech persistent0 harder to hear this morning Motor: B/l UE 5 proximally, 3+/5 distally, stable.  Right lower extremity 4/5 proximal distal, stable.  LLE: 4-/5 proximal to distal ataxia Skin: Skin iswarm.  Intact. Psychiatric:cooperative  Assessment/Plan: 1. 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 Physiatrist is providing close team  supervision and 24 hour management of active medical problems listed below. Physiatrist and rehab team continue to assess barriers to discharge/monitor patient progress toward functional and medical goals.  Function:  Bathing Bathing position Bathing activity did not occur: Refused Position: Wheelchair/chair at sink  Bathing parts Body parts bathed by patient: Right arm, Left arm, Chest, Abdomen(UB only) Body parts bathed by helper: Right lower leg, Left lower leg, Back  Bathing assist Assist Level: Set up, Supervision or verbal cues      Upper Body Dressing/Undressing Upper body dressing Upper body dressing/undressing activity did not occur: Refused What is the patient wearing?: Pull over shirt/dress     Pull over shirt/dress - Perfomed by patient: Thread/unthread right sleeve, Thread/unthread left sleeve, Put head through opening Pull over shirt/dress - Perfomed by helper: Pull shirt over trunk        Upper body assist Assist Level: Touching or steadying assistance(Pt > 75%)      Lower Body Dressing/Undressing Lower body dressing Lower body dressing/undressing activity did not occur: Refused What is the patient wearing?: Pants, Ted Hose, Non-skid slipper socks       Pants- Performed by helper: Thread/unthread right pants leg, Thread/unthread left pants leg, Pull pants up/down(pt A with pulling up pants 50%)   Non-skid slipper socks- Performed by helper: Don/doff right sock, Don/doff left sock               TED Hose - Performed by helper: Don/doff right TED hose, Don/doff left  TED hose  Lower body assist Assist for lower body dressing: (Total A)      Toileting Toileting Toileting activity did not occur: Refused   Toileting steps completed by helper: Adjust clothing after toileting    Toileting assist Assist level: Two helpers   Transfers Chair/bed transfer   Chair/bed transfer method: Lateral scoot Chair/bed transfer assist level: 2 helpers Chair/bed transfer  assistive device: Sliding board, Armrests Mechanical lift: Maximove   Locomotion Ambulation Ambulation activity did not occur: Safety/medical Investment banker, operational activity did not occur: Safety/medical concerns Type: Manual Max wheelchair distance: 20 Assist Level: Supervision or verbal cues  Cognition Comprehension Comprehension assist level: Understands basic 75 - 89% of the time/ requires cueing 10 - 24% of the time  Expression Expression assist level: Expresses basic 50 - 74% of the time/requires cueing 25 - 49% of the time. Needs to repeat parts of sentences.  Social Interaction Social Interaction assist level: Interacts appropriately 75 - 89% of the time - Needs redirection for appropriate language or to initiate interaction.  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 ELOS is currently 10/15---team conference today 2. DVT Prophylaxis/Anticoagulation: Extensive left lower extremity DVT status post IVC filter 02/27/2018. No  bleeding on Eliquis  Hemoglobin 11.3 on 9/30--->10.4 10/7  -follow up hgb 10.6 today 10/8  -recheck again later this week 3. Pain Management:Tylenol as needed 4. Mood:Provide emotional support 5. Neuropsych: This patientiscapable of making decisions on hisown behalf. 6. Skin/Wound Care:Routine skin checks 7. Fluids/Electrolytes/Nutrition:  -encourage PO   -I personally reviewed the patient's labs today.    -replace potassium today (3.1) 8.Leukocytosis.   Resolved  -Urine study negative 9.Hypertension. HCTZ 25 mg daily, Coreg 12.5mg  twice daily, Norvasc 10 mg daily,hydralazine 100 mg every 8 hours.   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  (close to range)   --increased urecholine to 25mg  QID schedule. 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 Attempt to provide counseling   LOS (Days) 21 A FACE TO FACE EVALUATION WAS PERFORMED  Ranelle Oyster, MD 03/26/2018 8:27 AM

## 2018-03-26 NOTE — Plan of Care (Signed)
  Problem: RH Balance Goal: LTG Patient will maintain dynamic standing balance (PT) Description LTG:  Patient will maintain dynamic standing balance with assistance during mobility activities (PT) Outcome: Not Applicable Flowsheets (Taken 03/26/2018 1424) LTG: Pt will maintain dynamic standing balance during mobility activities with:: -- (d/c 10/8 cw) Note:  D/C due to not a focus at this time   Problem: RH Bed to Chair Transfers Goal: LTG Patient will perform bed/chair transfers w/assist (PT) Description LTG: Patient will perform bed to chair transfers with assistance (PT). Flowsheets (Taken 03/26/2018 1424) LTG: Pt will perform Bed to Chair Transfers with assistance level  : Moderate Assistance - Patient 50 - 74% (downgraded 10/8 cw) Note:  Downgraded due to slow pt progress   Problem: RH Car Transfers Goal: LTG Patient will perform car transfers with assist (PT) Description LTG: Patient will perform car transfers with assistance (PT). Outcome: Not Applicable Flowsheets (Taken 03/26/2018 1424) LTG: Pt will perform car transfers with assist:: -- (d/c 10/8 cw) Note:  D/C due to updated d/c plan to SNF   Problem: RH Ambulation Goal: LTG Patient will ambulate in controlled environment (PT) Description LTG: Patient will ambulate in a controlled environment, # of feet with assistance (PT). Outcome: Not Applicable Flowsheets (Taken 03/26/2018 1425) LTG: Pt will ambulate in controlled environ  assist needed:: -- (d/c 10/8 cw) Note:  D/C due to not a focus at this time

## 2018-03-26 NOTE — Patient Care Conference (Addendum)
Inpatient RehabilitationTeam Conference and Plan of Care Update Date: 03/26/2018   Time: 2:00 PM    Patient Name: Wayne Buckley      Medical Record Number: [id number]  Date of Birth: 08-Aug-1950 Sex: Male         Room/Bed: 4W02C/4W02C-01 Payor Info: Payor: BLUE CROSS BLUE [employer]IELD / Plan: BCBS OTHER / Product Type: *No Product type* /    Admitting Diagnosis: ACDF  Admit Date/Time:  03/05/2018  2:54 PM Admission Comments: No comment available   Primary Diagnosis:  <principal problem not specified> Principal Problem: <principal problem not specified>  Patient Active Problem List   Diagnosis Date Noted  . Acute blood loss anemia   . Cord compression (HCC) 03/05/2018  . C5-C7 level spinal cord injury (HCC)   . Central cord syndrome (HCC)   . Neurogenic bladder   . Essential hypertension   . AKI (acute kidney injury) (HCC) 02/26/2018  . Bilateral leg weakness 02/26/2018  . Leg DVT (deep venous thromboembolism), acute, left (HCC) 02/26/2018  . Fall 02/25/2018  . Alcohol abuse 05/04/2012  . Hypokalemia 05/04/2012  . Urinary retention 05/04/2012  . Subdural hematoma, post-traumatic (HCC) 04/28/2012    Expected Discharge Date: Expected Discharge Date: (SNF)  Team Members Present: Physician leading conference: Dr. Faith Rogue Social Worker Present: [name], LCSW Nurse Present: Willey Blade, RN PT Present: Teodoro Kil, PT OT Present: Callie Fielding, OT PPS Coordinator present : Tora Duck, RN, CRRN     Current Status/Progress Goal Weekly Team Focus  Medical   continued neurogenic bladder and bowel. slow progress a whole due to cognitive deficits  finalize medical needs for next venue of care  see above   Bowel/Bladder   Bowel incontinence, unable to urinate, bladder scan q4hrs, in and out cath q6-8hrs, last BM 03/25/18  continent of bowel and able to urinate on his own with mini assist  Assess bowel and bladder needs, and timed toileting q 2 hrs while awake    Swallow/Nutrition/ Hydration             ADL's   Mod-Max A slideboard transfers, Mod-Max A LB self care completed bedlevel with heavy use of hospital bed functions. Still very self limiting and requires max encouragement to participate during sessions   Min A overall, toileting goals d/c as he refuses to attempt these transfers, LB self care goals to be downgraded to Mod A   Active therapy participation, UE/LE coordination, balance, functional transfers, endurance, strengthening, pt/family education    Mobility   at best min assist for slide board transfers, can be up to max +2 with slide board, no recall/insight  downgraded to mod assist overall, w/c level; d/c car transfer and ambulation goals  balance, w/c propulsion, plan for d/c to SNF   Communication             Safety/Cognition/ Behavioral Observations  Mod assist  min assist      Pain   Denies any kind of pain  No pain  Assess pain q 4 hrs and PRN   Skin              Rehab Goals Patient on target to meet rehab goals: No Rehab Goals Revised: anticipate several goals to be downgraded *See Care Plan and progress notes for long and short-term goals.     Barriers to Discharge  Current Status/Progress Possible Resolutions Date Resolved   Physician    Medical stability        see prior  Nursing                  PT                    OT                  SLP                SW                Discharge Planning/Teaching Needs:  Pt and wife agreed that d/c plan likely to be SNF unless significant positive change takes place in short term on CIR.      Team Discussion:  Plan changed to SNF per SW.  Pt still with very limited insight into deficits and need for him to fully participate in CIR program.  Toileting goals d/c'd due to pt refusal to work on this overall at a total assist +1 with occasional mod assist with some activities.  Still requires caths and is incont of bowel.  MD states ready for move to SNF.   Revisions to Treatment Plan:  Change in d/c plan to SNF    Continued Need for Acute Rehabilitation Level of Care: The patient requires daily medical management by a physician with specialized training in physical medicine and rehabilitation for the following conditions: Daily direction of a multidisciplinary physical rehabilitation program to ensure safe treatment while eliciting the highest outcome that is of practical value to the patient.: Yes Daily medical management of patient stability for increased activity during participation in an intensive rehabilitation regime.: Yes Daily analysis of laboratory values and/or radiology reports with any subsequent need for medication adjustment of medical intervention for : Neurological problems   I attest that I was present, lead the team conference, and concur with the assessment and plan of the team.   Lakhia Gengler 03/27/2018, 9:17 AM

## 2018-03-26 NOTE — Progress Notes (Signed)
Physical Therapy Session Note  Patient Details  Name: Wayne Buckley MRN: [id number] Date of Birth: 1951-03-15  Today's Date: 03/26/2018 PT Individual Time: 1430-1500 PT Individual Time Calculation (min): 30 min   Short Term Goals: Week 3:  PT Short Term Goal 1 (Week 3): Pt will verbalize 2 steps of slide board transfer with min question cues.  PT Short Term Goal 2 (Week 3): Patient will tolerate supported standing for 5 minutes for improved weight bearing and activity tolerance  PT Short Term Goal 3 (Week 3): Patient will initiate pre-gait activities including weight shifting in standing  PT Short Term Goal 4 (Week 3): Pt will propel w/c 22' with supervision.   Skilled Therapeutic Interventions/Progress Updates:    no c/o pain.  Pt had returned back to bed during 30 minute break, states he thought he was done despite this therapist stating multiple times at the end of previous session that next session would be at 1430.  Session focus on transfers and sitting balance.    Supine>sit with HOB elevated and bedrails with supervision and significantly increased time and verbal cues.  Slide board transfer to w/c on L with mod assist and max multimodal cues for head/hips relationship and forward weight shift.  Dynavision x3 trials from w/c level focus on UE coordination, visual scanning, and trunk control on mode A 2 minutes each trial.  Pt requires max encouragement to reach for farther targets and up to min assist for sitting balance in w/c.  Returned to room at end of session and positioned in w/c with chair alarm intact call bell in reach and needs met.   Therapy Documentation Precautions:  Precautions Precautions: Fall, Cervical Precaution Comments: No brace Restrictions Weight Bearing Restrictions: No    Therapy/Group: Individual Therapy  [name] 03/26/2018, 3:05 PM

## 2018-03-26 NOTE — Progress Notes (Signed)
Offered pt BSC. Pt refused. Informed that needs to get up and attempt to go to bathroom to have BM and to facility urination.  OT came into room.  Had steady and informed that need transfer updated.  Still encouraging pt to [facility] and informed that doing bath.  Refusing everything, pt unable to focus on any type of conversation and continued to talk about the shower.  Pt said that would do this at home. Informed that needs to work on it here that wife can not be expected to assist with care at current level. Informed that will continue to discuss later and will be going to shower with therapy. Was informed at end of session that pt got up in stedy and had BM after refusing BSC. [name]. Will continue to monitor.

## 2018-03-26 NOTE — Progress Notes (Signed)
Physical Therapy Session Note  Patient Details  Name: Wayne Buckley MRN: [id number] Date of Birth: 1950/08/08  Today's Date: 03/26/2018 PT Individual Time: 1300-1355 PT Individual Time Calculation (min): 55 min   Short Term Goals: Week 3:  PT Short Term Goal 1 (Week 3): Pt will verbalize 2 steps of slide board transfer with min question cues.  PT Short Term Goal 2 (Week 3): Patient will tolerate supported standing for 5 minutes for improved weight bearing and activity tolerance  PT Short Term Goal 3 (Week 3): Patient will initiate pre-gait activities including weight shifting in standing  PT Short Term Goal 4 (Week 3): Pt will propel w/c 61' with supervision.   Skilled Therapeutic Interventions/Progress Updates:    Updated STGs on 10/8.   No c/o pain.  Session focus on uneven slide board transfers, sitting balance and postural control, and recall.    Pt transfers uphill to therapy mat on R with mod assist and max multimodal cues for maintaining forward weight shift and head/hips relationship.  Seated balance on therapy mat focus on upright posture and decreasing forward neck flexion with basketball toss (5 trials to fatigue) and ball taps with 3# dowel (5 trials to fatigue).  Pt requires rest breaks in between, and c/o dizziness after each trial, ? Due to motion sensitivity.  W/C propulsion throughout unit, max distance in one trial 62' with encouragement to continue.  Therapeutic conversation to elicit recall, pt reports that he showered this AM (correct), but also reports that he did not refuse getting on [facility] with nursing or therapy (incorrect).  Pt returned to room at end of session and positioned in w/c with chair alarm intact, call bell in reach, and needs met.   Therapy Documentation Precautions:  Precautions Precautions: Fall, Cervical Precaution Comments: No brace Restrictions Weight Bearing Restrictions: No    Therapy/Group: Individual Therapy  [name] 03/26/2018,  2:00 PM

## 2018-03-26 NOTE — Progress Notes (Signed)
Occupational Therapy Session Note  Patient Details  Name: Wayne Buckley MRN: [id number] Date of Birth: September 03, 1950  Today's Date: 03/26/2018 OT Individual Time: 4098-1191 and 1530-1600 OT Individual Time Calculation (min): 76 min and 30 min    Short Term Goals: Week 3:  OT Short Term Goal 1 (Week 3): STGs=LTGs due to ELOS  Skilled Therapeutic Interventions/Progress Updates:    Session 1: Upon entering the room, pt supine in bed but agreeable to OT intervention with encouragement. RN present and requesting to attempting toileting but pt declined. Pt initially refusing shower this session but OT provided maximal encouraging and education for pt participation. Pt performed bed mobility to EOB with min A. Pt standing in stedy with mod lifting assistance and transferred into roll in shower chair to wash. Pt sitting on shower chair and begins to have BM on the floor this is after he refused toileting at beginning of session. He then verbalized, " I just just used pooped." OT rolled pt into bathroom and over toilet while on shower chair and he reports, " I'm done" but when moved to shower he begins attempting to push to have BM in shower. Mod A for bathing tasks with set up A and max cuing for sequencing and initiation this session. Pt standing in STEDY to don pants and brief with total A. OT placed LEs into figure 4 position and donning socks onto toes with pt able to pull the rest of the way on. Pt remained seated in wheelchair and wife present in the room and informed on sessions events. She also discussed importance of toileting with pt. Call bell and all needed items within reach upon exiting the room.   Session 2:  Upon entering the room, pt seated in wheelchair with no c/o pain and agreeable to OT intervention. Pt propelling self 72' towards day room with supervision. Pt engaged in B UE coordination task at table top with no drops with resistive pegboard. Pt requesting to return to bed at end of session.  OT placed slide board and mod A slide board transfer from wheelchair >bed with mod multimodal cuing for head hip relationship. Mod A sit >supine for B LEs. Bed alarm activated and call bell within reach upon exiting the room.   Therapy Documentation Precautions:  Precautions Precautions: Fall, Cervical Precaution Comments: No brace Restrictions Weight Bearing Restrictions: No   Therapy/Group: Individual Therapy  [name] 03/26/2018, 12:33 PM

## 2018-03-27 ENCOUNTER — Inpatient Hospital Stay (HOSPITAL_COMMUNITY): Payer: Medicare Other | Admitting: Occupational Therapy

## 2018-03-27 ENCOUNTER — Inpatient Hospital Stay (HOSPITAL_COMMUNITY): Payer: Medicare Other | Admitting: Physical Therapy

## 2018-03-27 ENCOUNTER — Inpatient Hospital Stay (HOSPITAL_COMMUNITY): Payer: Medicare Other

## 2018-03-27 NOTE — Progress Notes (Addendum)
Physical Therapy Session Note  Patient Details  Name: Wayne Buckley MRN: [id number] Date of Birth: October 05, 1950  Today's Date: 03/27/2018 PT Individual Time: 1135-1208 PT Individual Time Calculation (min): 33 min   Short Term Goals:  Week 3:  PT Short Term Goal 1 (Week 3): Pt will verbalize 2 steps of slide board transfer with min question cues.  PT Short Term Goal 2 (Week 3): Patient will tolerate supported standing for 5 minutes for improved weight bearing and activity tolerance  PT Short Term Goal 3 (Week 3): Patient will initiate pre-gait activities including weight shifting in standing  PT Short Term Goal 4 (Week 3): Pt will propel w/c 66' with supervision.       Skilled Therapeutic Interventions/Progress Updates:   W/c propulsion using bilUEs on level tile x 150' with 3 short rest breaks.  neuromuscular re-education via forced use, multimodal cues for alternating reciprocal movement x bil LEs x 25 sitting upright in w/c using Kinetron at level 70 cm/sec focusing on gluteal muscles, x 25 cycles at level 50 cm/sec  in trunk extension focusing on quadriceps.  Reciprocal scooting forward/backward sitting in w/c with bil UE support fading to 0UE support with cues to slow down.  Pt's weak abdominal and back muscles, and habitual sacral sitting limit his ability to balance during this movement.  Attempted sit> stand using bed rail, cues for head hips relationship and mod assist.  Pt unable to bring weight over LEs safely with +1 at this time.  Pt left resting in w/c with seat belt alarm set, needs at hand and wife in room.      Therapy Documentation Precautions:  Precautions Precautions: Fall, Cervical Precaution Comments: No brace Restrictions Weight Bearing Restrictions: No   Pain: Pain Assessment Pain Scale: 0-10 Pain Score: 0-No pain    Therapy/Group: Individual Therapy  Marvelene Stoneberg 03/27/2018, 1:01 PM

## 2018-03-27 NOTE — Progress Notes (Addendum)
Physical Therapy Session Note  Patient Details  Name: [name] MRN: [id number] Date of Birth: [birth year]  Today's Date: 03/27/2018 PT Individual Time: 4098-1191 PT Individual Time Calculation (min): 15 min   Short Term Goals: Week 3:  PT Short Term Goal 1 (Week 3): Pt will verbalize 2 steps of slide board transfer with min question cues.  PT Short Term Goal 2 (Week 3): Patient will tolerate supported standing for 5 minutes for improved weight bearing and activity tolerance  PT Short Term Goal 3 (Week 3): Patient will initiate pre-gait activities including weight shifting in standing  PT Short Term Goal 4 (Week 3): Pt will propel w/c 72' with supervision.   Skilled Therapeutic Interventions/Progress Updates:  Pt missed first half of session 2/2 nursing care (cathing pt). When finished pt agreeable to tx & no c/o pain reported. Pt transferred sitting EOB>w/c with slide board & mod assist with max multimodal cuing for anterior weight shifting, head/hips relationship and pushing through BLE/BUE to scoot across board. Transported pt to dayroom via w/c total assist for time management & set pt up at BUE ergometer. Pt utilized ergometer on level 1 x 3 minutes forwards + ~1 backwards with 7 rest breaks with pt citing dizziness during 3 minutes forwards. Therapist encouraged pt to complete task for longer bouts before requiring rest breaks and to focus on object to reduce dizziness but pt continued to require frequent rest breaks. At end of session pt returned to room & left sitting in w/c with chair alarm donned & call bell in reach. Encouraged pt to remain in w/c until next session at 2pm.  Therapy Documentation Precautions:  Precautions Precautions: Fall, Cervical Precaution Comments: No brace Restrictions Weight Bearing Restrictions: No   General: PT Amount of Missed Time (min): 15 Minutes PT Missed Treatment Reason: Nursing care     Therapy/Group: Individual Therapy  Sandi Mariscal 03/27/2018, 1:30 PM

## 2018-03-27 NOTE — Progress Notes (Signed)
Physical Therapy Session Note  Patient Details  Name: Wayne Buckley MRN: [id number] Date of Birth: Oct 21, 1950  Today's Date: 03/27/2018 PT Individual Time: 0940-1006 PT Individual Time Calculation (min): 26 min   Short Term Goals: Week 3:  PT Short Term Goal 1 (Week 3): Pt will verbalize 2 steps of slide board transfer with min question cues.  PT Short Term Goal 2 (Week 3): Patient will tolerate supported standing for 5 minutes for improved weight bearing and activity tolerance  PT Short Term Goal 3 (Week 3): Patient will initiate pre-gait activities including weight shifting in standing  PT Short Term Goal 4 (Week 3): Pt will propel w/c 79' with supervision.   Skilled Therapeutic Interventions/Progress Updates:    no c/o pain.  Session focus on bed mobility, transfers, and w/c propulsion.   Pt rolls L/R for lower body dressing at bed level with min assist to facilitate full side lying.  Pt able to thread RLE through pants and pull over hips and don R sock.  PT provided assist for L side.  Supine>sit with HOB elevated maximally and bed rails, min assist to maintain balance.  Slide board transfer to w/c on L with mod assist and mod cues for forward weight shift and head/hips relationship.  Better carry over noted today with head/hips relationship.  W/C propulsion through obstacle course x4 trials, initially cones placed 5' apart, progress to 4' apart with max cues to avoid obstacles when on L side and min cues to avoid obstacles on R side.  Handoff to OT in hallway  Therapy Documentation Precautions:  Precautions Precautions: Fall, Cervical Precaution Comments: No brace Restrictions Weight Bearing Restrictions: No    Therapy/Group: Individual Therapy  Stephania Fragmin 03/27/2018, 9:20 AM

## 2018-03-27 NOTE — Progress Notes (Signed)
Physical Therapy Session Note  Patient Details  Name: Wayne Buckley MRN: [id number] Date of Birth: 1950-10-19  Today's Date: 03/27/2018 PT Individual Time: 0102-7253 PT Individual Time Calculation (min): 25 min   Short Term Goals: Week 3:  PT Short Term Goal 1 (Week 3): Pt will verbalize 2 steps of slide board transfer with min question cues.  PT Short Term Goal 2 (Week 3): Patient will tolerate supported standing for 5 minutes for improved weight bearing and activity tolerance  PT Short Term Goal 3 (Week 3): Patient will initiate pre-gait activities including weight shifting in standing  PT Short Term Goal 4 (Week 3): Pt will propel w/c 33' with supervision.   Skilled Therapeutic Interventions/Progress Updates: Pt presented in w/c agreeable to therapy with wife present. Pt propelled w/c approx 152f for endurance with frequent breaks however pt continued without encouragement. PTA transported remaining distance for time management. Participated in SB transfer to R with modA and mod verbal cues for increased forward flexion and increased head hips relationship. Pt then participated in weighted ball shoulder flexion UE strengthening. Participated in lateral scooting to R/L requiring modA due to decreased forward flexion and poor activation of BLE to facilitate scoot. Pt was able to perform x 1 scoot L and R with minA. Pt then returned to w/c via SB with modA requiring tactile cues to avoid pulling self towards w/c. Pt propelled additional 792fwith RW with frequent breaks and pt returned to room. Pt remained in w/c at end of session with belt alarm on, wife present, and current needs met.      Therapy Documentation Precautions:  Precautions Precautions: Fall, Cervical Precaution Comments: No brace Restrictions Weight Bearing Restrictions: No General: PT Amount of Missed Time (min): 15 Minutes PT Missed Treatment Reason: Nursing care Vital Signs: Therapy Vitals Temp: 99.4 F (37.4 C) Temp  Source: Oral Pulse Rate: 90 Resp: 17 BP: 109/71 Patient Position (if appropriate): Sitting Oxygen Therapy SpO2: 96 % O2 Device: Room Air Pain: Pain Assessment Pain Scale: 0-10 Pain Score: 0-No pain    Therapy/Group: Individual Therapy  Doc Mandala  Tremaine Fuhriman, PTA  03/27/2018, 3:52 PM

## 2018-03-27 NOTE — Progress Notes (Signed)
Occupational Therapy Session Note  Patient Details  Name: Wayne Buckley MRN: [id number] Date of Birth: Jan 20, 1951  Today's Date: 03/27/2018 OT Individual Time: 1000-1057 OT Individual Time Calculation (min): 57 min    Short Term Goals: Week 3:  OT Short Term Goal 1 (Week 3): STGs=LTGs due to ELOS  Skilled Therapeutic Interventions/Progress Updates:   Pt transitioned from PT session to OT with no c/o pain this session. Pt propelled wheelchair with supervision and min cuing for encouragement 5' towards day room with B UEs for strengthening. Pt engaged in B UE strengthening with use of 4 lb resistive dowel rod. OT demonstrating exercise and pt performing with min directional cues for proper technique. Pt performed bicep curls, straight arm raises, forward rowing, backwards rowing, and chest press with 3 sets of 10. Pt taking rest breaks as needed. Pt propelled wheelchair 5' in same manner towards ADL apartment to practice uneven transfers. Pt requesting to get into recliner chair but refused slideboard. Pt required mod squat pivot from wheelchair >recliner chair but needed total A for squat pivot transfer from recliner chair >wheelchair secondary to recliner being low and having to raise bottom further to clear wheelchair. Pt returning to room at end of session with wife present in room. Cal bell and all needed items within reach.   Therapy Documentation Precautions:  Precautions Precautions: Fall, Cervical Precaution Comments: No brace Restrictions Weight Bearing Restrictions: No Pain: Pain Assessment Pain Scale: 0-10 Pain Score: 0-No pain   Therapy/Group: Individual Therapy  [name] 03/27/2018, 12:36 PM

## 2018-03-27 NOTE — Progress Notes (Signed)
[facility] PHYSICAL MEDICINE & REHABILITATION     PROGRESS NOTE    Subjective/Complaints: No new complaints this morning. Slept well   ROS: Patient denies fever, rash, sore throat, blurred vision, nausea, vomiting, diarrhea, cough, shortness of breath or chest pain, joint or back pain, headache, or mood change.     Objective:   No results found. Recent Labs    03/25/18 0725 03/26/18 0401  WBC 5.5  --   HGB 10.4* 10.6*  HCT 31.9* 32.6*  PLT 187  --    Recent Labs    03/25/18 0725  NA 133*  K 3.1*  CL 101  GLUCOSE 97  BUN 12  CREATININE 0.47*  CALCIUM 8.3*   CBG (last 3)  No results for input(s): GLUCAP in the last 72 hours.  Wt Readings from Last 3 Encounters:  03/27/18 101.2 kg  03/02/18 99.8 kg  08/07/12 88.2 kg     Intake/Output Summary (Last 24 hours) at 03/27/2018 0825 Last data filed at 03/27/2018 0347 Gross per 24 hour  Intake 372 ml  Output 1600 ml  Net -1228 ml    Vital Signs: Blood pressure 120/80, pulse 78, temperature 98.1 F (36.7 C), temperature source Oral, resp. rate 18, height 5\' 11"  (1.803 m), weight 101.2 kg, SpO2 98 %. Physical Exam:  Constitutional: No distress . Vital signs reviewed. HEENT: EOMI, oral membranes moist Neck: supple Cardiovascular: RRR without murmur. No JVD    Respiratory: CTA Bilaterally without wheezes or rales. Normal effort    GI: BS +, non-tender, non-distended  Musc: Left lower extremity edema 1+  Neurological: He isalertand oriented Dysarthric speech persistent0 harder to hear this morning Motor: B/l UE 5 proximally, 3+/5 distally, stable.  Right lower extremity 4/5 proximal distal, stable.  LLE: 4-/5 proximal to distal ataxia Skin: Skin iswarm.  Intact. Psychiatric:cooperative  Assessment/Plan: 1. 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 Physiatrist is providing close  team supervision and 24 hour management of active medical problems listed below. Physiatrist and rehab team continue to assess barriers to discharge/monitor patient progress toward functional and medical goals.  Function:  Bathing Bathing position Bathing activity did not occur: Refused Position: Wheelchair/chair at sink  Bathing parts Body parts bathed by patient: Right arm, Left arm, Chest, Abdomen(UB only) Body parts bathed by helper: Right lower leg, Left lower leg, Back  Bathing assist Assist Level: Set up, Supervision or verbal cues      Upper Body Dressing/Undressing Upper body dressing Upper body dressing/undressing activity did not occur: Refused What is the patient wearing?: Pull over shirt/dress     Pull over shirt/dress - Perfomed by patient: Thread/unthread right sleeve, Thread/unthread left sleeve, Put head through opening Pull over shirt/dress - Perfomed by helper: Pull shirt over trunk        Upper body assist Assist Level: Touching or steadying assistance(Pt > 75%)      Lower Body Dressing/Undressing Lower body dressing Lower body dressing/undressing activity did not occur: Refused What is the patient wearing?: Pants, Ted Hose, Non-skid slipper socks       Pants- Performed by helper: Thread/unthread right pants leg, Thread/unthread left pants leg, Pull pants up/down(pt A with pulling up pants 50%)   Non-skid slipper socks- Performed by helper: Don/doff right sock, Don/doff left sock               TED Hose - Performed by helper: Don/doff right TED hose, Don/doff  left TED hose  Lower body assist Assist for lower body dressing: (Total A)      Toileting Toileting Toileting activity did not occur: Refused   Toileting steps completed by helper: Adjust clothing after toileting    Toileting assist Assist level: Two helpers   Transfers Chair/bed transfer   Chair/bed transfer method: Lateral scoot Chair/bed transfer assist level: 2 helpers Chair/bed  transfer assistive device: Sliding board, Armrests Mechanical lift: Maximove   Locomotion Ambulation Ambulation activity did not occur: Safety/medical Investment banker, operational activity did not occur: Safety/medical concerns Type: Manual Max wheelchair distance: 20 Assist Level: Supervision or verbal cues  Cognition Comprehension Comprehension assist level: Understands basic 75 - 89% of the time/ requires cueing 10 - 24% of the time  Expression Expression assist level: Expresses basic 50 - 74% of the time/requires cueing 25 - 49% of the time. Needs to repeat parts of sentences.  Social Interaction Social Interaction assist level: Interacts appropriately 75 - 89% of the time - Needs redirection for appropriate language or to initiate interaction.  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 ELOS is currently 10/15---team conference today 2. DVT Prophylaxis/Anticoagulation: Extensive left lower extremity DVT status post IVC filter 02/27/2018. No  bleeding on Eliquis  Hemoglobin 11.3 on 9/30--->10.4 10/7  -follow up hgb 10.6  10/8  -recheck again later this week 3. Pain Management:Tylenol as needed 4. Mood:Provide emotional support 5. Neuropsych: This patientiscapable of making decisions on hisown behalf. 6. Skin/Wound Care:Routine skin checks 7. Fluids/Electrolytes/Nutrition:  -encourage PO     -replaced potassium today (3.1) 8.Leukocytosis.  Resolved  -Urine study negative 9.Hypertension. HCTZ 25  mg daily, Coreg 12.5mg  twice daily, Norvasc 10 mg daily,hydralazine 100 mg every 8 hours.   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  (close to range)   --increased urecholine to 25mg  QID schedule. 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 Attempt to provide counseling   LOS (Days) 22 A FACE TO FACE EVALUATION WAS PERFORMED  Ranelle Oyster, MD 03/27/2018 8:25 AM

## 2018-03-27 NOTE — Progress Notes (Signed)
Occupational Therapy Session Note  Patient Details  Name: Wayne Buckley MRN: [id number] Date of Birth: 01-15-1951  Today's Date: 03/27/2018 OT Individual Time: 1400-1430 OT Individual Time Calculation (min): 30 min    Short Term Goals: Week 1:  OT Short Term Goal 1 (Week 1): Pt will perform toilet transfer with max A. OT Short Term Goal 1 - Progress (Week 1): Not met OT Short Term Goal 2 (Week 1): Pt will perform UB dressing with mod A.  OT Short Term Goal 2 - Progress (Week 1): Met OT Short Term Goal 3 (Week 1): Pt will engaged in 15 minutes of functional task with 1 rest break or less.  OT Short Term Goal 3 - Progress (Week 1): Not met Week 2:  OT Short Term Goal 1 (Week 2): Pt will perform toilet transfer with assist of 1. OT Short Term Goal 1 - Progress (Week 2): Not met(due to pt refusing to try toileting) OT Short Term Goal 2 (Week 2): Pt will perform LB dressing with mod A from bed level with modifications as needed. OT Short Term Goal 2 - Progress (Week 2): Met OT Short Term Goal 3 (Week 2): Pt will demonstrate long sitting with min A from bed. OT Short Term Goal 3 - Progress (Week 2): Not met Week 3:  OT Short Term Goal 1 (Week 3): STGs=LTGs due to ELOS  Skilled Therapeutic Interventions/Progress Updates:    1:1 Focus on standing tolerance and trunk control. Pt able to come into standing with min A pulling up on tilt table! Harness in place for more support and to not allow uncontrolled decent. Pt able to tolerate standing for ~7 min at time. Pt does have heavy reliance on his UEs for support and used for trunk control (maintaining forward lean). Pt engaged in [employer] while standing. Pt did require max cuing for each turn to correctly play the game. PT able to maintain standing 3 different times in standing frame.  Pt also was able to weight shift enough on his own for foot adjustments (prep stepping). Left up in the w/c at end of session.   Therapy Documentation Precautions:   Precautions Precautions: Fall, Cervical Precaution Comments: No brace Restrictions Weight Bearing Restrictions: No   Pain:  no c/o pain in session   Therapy/Group: Individual Therapy  Willeen [facility] 03/27/2018, 7:22 PM

## 2018-03-28 ENCOUNTER — Inpatient Hospital Stay (HOSPITAL_COMMUNITY): Payer: Medicare Other | Admitting: Physical Therapy

## 2018-03-28 ENCOUNTER — Inpatient Hospital Stay (HOSPITAL_COMMUNITY): Payer: Medicare Other | Admitting: Occupational Therapy

## 2018-03-28 LAB — BASIC METABOLIC PANEL
ANION GAP: 8 (ref 5–15)
BUN: 11 mg/dL (ref 8–23)
CO2: 26 mmol/L (ref 22–32)
Calcium: 8.8 mg/dL — ABNORMAL LOW (ref 8.9–10.3)
Chloride: 102 mmol/L (ref 98–111)
Creatinine, Ser: 0.47 mg/dL — ABNORMAL LOW (ref 0.61–1.24)
GFR calc Af Amer: >60 mL/min (ref >60)
Glucose, Bld: 99 mg/dL (ref 70–99)
POTASSIUM: 3.4 mmol/L — AB (ref 3.5–5.1)
SODIUM: 136 mmol/L (ref 135–145)

## 2018-03-28 LAB — CBC
HEMATOCRIT: 31.8 % — AB (ref 39.0–52.0)
HEMOGLOBIN: 10.1 g/dL — AB (ref 13.0–17.0)
MCH: 28.9 pg (ref 26.0–34.0)
MCHC: 31.8 g/dL (ref 30.0–36.0)
MCV: 91.1 fL (ref 80.0–100.0)
NRBC: 0 % (ref 0.0–0.2)
Platelets: 249 10*3/uL (ref 150–400)
RBC: 3.49 MIL/uL — AB (ref 4.22–5.81)
RDW: 14.6 % (ref 11.5–15.5)
WBC: 5.2 10*3/uL (ref 4.0–10.5)

## 2018-03-28 NOTE — Progress Notes (Signed)
Social Work Patient ID: Wayne Buckley, male   DOB: 1951/03/13, 67 y.o.   MRN: [id number]  Met with pt and wife yesterday to review team conference.  Explained to both that team feels pt's progress this week has been limited.  Team feels pt is at a point that he could transition to SNF to continue rehab at that level.  Pt simply asks, "Where will I go?" and does not appear concerned about this plan.  Wife, however, tearful but resigned to the reality that this is needed path moving forward.  She admits frustration with limited progress and states, "now he's just going to go somewhere else and get even less therapy."  Provided support.  Discussed area SNF options.  Will begin bed search.  Quinesha Selinger, LCSW

## 2018-03-28 NOTE — Progress Notes (Signed)
[facility] PHYSICAL MEDICINE & REHABILITATION     PROGRESS NOTE    Subjective/Complaints: No new issues  ROS: Patient denies fever, rash, sore throat, blurred vision, nausea, vomiting, diarrhea, cough, shortness of breath or chest pain, joint or back pain, headache, or mood change.    Objective:   No results found. Recent Labs    03/26/18 0401 03/28/18 0445  WBC  --  5.2  HGB 10.6* 10.1*  HCT 32.6* 31.8*  PLT  --  249   Recent Labs    03/28/18 0445  NA 136  K 3.4*  CL 102  GLUCOSE 99  BUN 11  CREATININE 0.47*  CALCIUM 8.8*   CBG (last 3)  No results for input(s): GLUCAP in the last 72 hours.  Wt Readings from Last 3 Encounters:  03/27/18 101.2 kg  03/02/18 99.8 kg  08/07/12 88.2 kg     Intake/Output Summary (Last 24 hours) at 03/28/2018 0827 Last data filed at 03/28/2018 0000 Gross per 24 hour  Intake 220 ml  Output 1000 ml  Net -780 ml    Vital Signs: Blood pressure 121/78, pulse 86, temperature 98.2 F (36.8 C), temperature source Oral, resp. rate 18, height 5\' 11"  (1.803 m), weight 101.2 kg, SpO2 97 %. Physical Exam:  Constitutional: No distress . Vital signs reviewed. HEENT: EOMI, oral membranes moist Neck: supple Cardiovascular: RRR without murmur. No JVD    Respiratory: CTA Bilaterally without wheezes or rales. Normal effort    GI: BS +, non-tender, non-distended   Musc: Left lower extremity edema 1+  Neurological: He isalertand oriented Dysarthric speech persistent0 harder to hear this morning Motor: B/l UE 5 proximally, 3+/5 distally, stable.  Right lower extremity 4/5 proximal distal, no change.  LLE: 4-/5 proximal to distal ataxia Skin: Skin iswarm.  Intact. Psychiatric:cooperative  Assessment/Plan: 1. 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 Physiatrist is providing close team supervision and 24 hour  management of active medical problems listed below. Physiatrist and rehab team continue to assess barriers to discharge/monitor patient progress toward functional and medical goals.  Function:  Bathing Bathing position Bathing activity did not occur: Refused Position: Wheelchair/chair at sink  Bathing parts Body parts bathed by patient: Right arm, Left arm, Chest, Abdomen(UB only) Body parts bathed by helper: Right lower leg, Left lower leg, Back  Bathing assist Assist Level: Set up, Supervision or verbal cues      Upper Body Dressing/Undressing Upper body dressing Upper body dressing/undressing activity did not occur: Refused What is the patient wearing?: Pull over shirt/dress     Pull over shirt/dress - Perfomed by patient: Thread/unthread right sleeve, Thread/unthread left sleeve, Put head through opening Pull over shirt/dress - Perfomed by helper: Pull shirt over trunk        Upper body assist Assist Level: Touching or steadying assistance(Pt > 75%)      Lower Body Dressing/Undressing Lower body dressing Lower body dressing/undressing activity did not occur: Refused What is the patient wearing?: Pants, Ted Hose, Non-skid slipper socks       Pants- Performed by helper: Thread/unthread right pants leg, Thread/unthread left pants leg, Pull pants up/down(pt A with pulling up pants 50%)   Non-skid slipper socks- Performed by helper: Don/doff right sock, Don/doff left sock               TED Hose - Performed by helper: Don/doff right TED hose, Don/doff left TED hose  Lower body assist Assist for lower body dressing: (Total A)      Toileting Toileting Toileting activity did not occur: Refused   Toileting steps completed by helper: Adjust clothing after toileting    Toileting assist Assist level: Two helpers   Transfers Chair/bed transfer   Chair/bed transfer method: Lateral scoot Chair/bed transfer assist level: 2 helpers Chair/bed transfer assistive device:  Sliding board, Armrests Mechanical lift: Maximove   Locomotion Ambulation Ambulation activity did not occur: Safety/medical Investment banker, operational activity did not occur: Safety/medical concerns Type: Manual Max wheelchair distance: 20 Assist Level: Supervision or verbal cues  Cognition Comprehension Comprehension assist level: Understands basic 75 - 89% of the time/ requires cueing 10 - 24% of the time  Expression Expression assist level: Expresses basic 50 - 74% of the time/requires cueing 25 - 49% of the time. Needs to repeat parts of sentences.  Social Interaction Social Interaction assist level: Interacts appropriately 75 - 89% of the time - Needs redirection for appropriate language or to initiate interaction.  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 DVT Prophylaxis/Anticoagulation: Extensive left lower extremity DVT status post IVC filter 02/27/2018. No  bleeding on Eliquis  Hemoglobin 11.3 on 9/30--->10.4 10/7  -follow up hgb 10.6  10/8---recheck tomorrow 3. Pain Management:Tylenol as needed 4. Mood:Provide emotional support 5. Neuropsych: This patientiscapable of making decisions on hisown behalf. 6. Skin/Wound Care:Routine skin checks 7. Fluids/Electrolytes/Nutrition:  -encourage PO     -replaced potassium(3.1) 8.Leukocytosis.  Resolved  -Urine study negative 9.Hypertension. HCTZ 25 mg daily, Coreg 12.5mg  twice daily, Norvasc 10 mg daily,hydralazine 100 mg every 8 hours.   -no orthostasis  -BP controlled 10/8 10.BPH/neurogenic  bladder -Flomax 0.4 mg daily -  I/O caths to keep volume between 300-500.  (close to range)   --increased urecholine to 25mg  QID schedule. 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 Attempt to provide counseling   LOS (Days) 23 A FACE TO FACE EVALUATION WAS PERFORMED  Ranelle Oyster, MD 03/28/2018 8:27 AM

## 2018-03-28 NOTE — Plan of Care (Signed)
  Problem: RH SAFETY Goal: RH STG ADHERE TO SAFETY PRECAUTIONS W/ASSISTANCE/DEVICE Description STG Adhere to Safety Precautions With min Assistance/Device.  Outcome: Progressing Goal: RH STG DECREASED RISK OF FALL WITH ASSISTANCE Description STG Decreased Risk of Fall With min Assistance.  Outcome: Progressing  Call light within reach, bed/chair alarm, proper footwear.  

## 2018-03-28 NOTE — Progress Notes (Signed)
Physical Therapy Session Note  Patient Details  Name: Wayne Buckley MRN: [id number] Date of Birth: Dec 08, 1950  Today's Date: 03/28/2018 PT Individual Time: 2820-6015 PT Individual Time Calculation (min): 57 min   Short Term Goals: Week 3:  PT Short Term Goal 1 (Week 3): Pt will verbalize 2 steps of slide board transfer with min question cues.  PT Short Term Goal 2 (Week 3): Patient will tolerate supported standing for 5 minutes for improved weight bearing and activity tolerance  PT Short Term Goal 3 (Week 3): Patient will initiate pre-gait activities including weight shifting in standing  PT Short Term Goal 4 (Week 3): Pt will propel w/c 59' with supervision.   Skilled Therapeutic Interventions/Progress Updates:    Patient received in bed, pleasant and willing to participate in session but very fatigued from this morning; wife present through part of session and assisted in encouraging patient throughout. Able to complete bed mobility with MinA and sliding board transfer to the L with MaxA due to fatigue, patient then able to self-propel WC distances of approximately 70-54f before fatiguing today. Worked on standing in standing frame with ModA to come to stand and Mod-Max cues for full standing position and upright posture, breathing strategies as appropriate. Only able to maintain standing for 60 seconds or less due to fatigue but able to achieve full upright posture well on 2nd and 3rd attempts and required ModA/Max cues for controlled return to sitting on each attempt today. Worked on core stability/seated balance in front of dynavision with min guard 60 second bouts x2 before patient fatigued. Able to self-propel WC approximately 820fback to room and was returned to his room totalA in [employer] to fatigue. ModA for sliding board transfer to R again due to high fatigue levels this afternoon and ModA to return to supine position in bed. He was left in bed with all needs met, bed alarm activated this  afternoon.   Therapy Documentation Precautions:  Precautions Precautions: Fall, Cervical Precaution Comments: No brace Restrictions Weight Bearing Restrictions: No General:   Pain: Pain Assessment Pain Scale: Faces Faces Pain Scale: Hurts a little bit Pain Type: Chronic pain Pain Location: Generalized Pain Orientation: Posterior Pain Descriptors / Indicators: Discomfort Pain Onset: On-going Patients Stated Pain Goal: 0 Pain Intervention(s): Repositioned;Ambulation/increased activity;Distraction    Therapy/Group: Individual Therapy  KrDeniece ReeT, DPT, CBIS  Supplemental Physical Therapist [facility]  Pager [phone] [phone] 03/28/2018, 3:45 PM

## 2018-03-28 NOTE — Progress Notes (Signed)
Physical Therapy Session Note  Patient Details  Name: Wayne Buckley MRN: [id number] Date of Birth: 08-Nov-1950  Today's Date: 03/28/2018 PT Individual Time: 1100-1200 PT Individual Time Calculation (min): 60 min   Short Term Goals: Week 3:  PT Short Term Goal 1 (Week 3): Pt will verbalize 2 steps of slide board transfer with min question cues.  PT Short Term Goal 2 (Week 3): Patient will tolerate supported standing for 5 minutes for improved weight bearing and activity tolerance  PT Short Term Goal 3 (Week 3): Patient will initiate pre-gait activities including weight shifting in standing  PT Short Term Goal 4 (Week 3): Pt will propel w/c 1' with supervision.   Skilled Therapeutic Interventions/Progress Updates:    no c/o pain.  Session focus on NMR for LE and core strength.    Pt propels w/c throughout unit, max distance 70', with supervision.  Ball toss to [employer] with target of 8 throws/catches.  Pt requires max encouragement to re-attempt, 6 trials total with max number of throws/catches 5.  LE NMR with 4# ankle weights for SAQ and hip flexion, max multimodal cues for technique and pacing, and frequent rest breaks 2/2 fatigue and poor frustration tolerance.  Sit<>stand x2 in standing frame with mod assist to rise (sling not used to stand, but adjusted after standing to prevent uncontrolled descent).  Pt able to tolerate 4.5 minutes of standing during [employer] game.  He was unable to recall any of the games rules from playing yesterday.  On first descent pt with no forward weight shift at all, relying 100% on sling to control descent.  On second attempt, max cues forward weight shift without sling and improved control/weight shift noted.  Returned to room in w/c at end of session, chair alarm intact, call bell in reach and needs met.   Therapy Documentation Precautions:  Precautions Precautions: Fall, Cervical Precaution Comments: No brace Restrictions Weight Bearing Restrictions:  No    Therapy/Group: Individual Therapy  [name] 03/28/2018, 12:07 PM

## 2018-03-28 NOTE — Progress Notes (Signed)
Pt was bladder scanned at 1506. Writer assess pt at 1600 and scanned which at that time his volume was 255. He was scan and In/Out appropriately. Please view flowsheet.

## 2018-03-28 NOTE — Progress Notes (Signed)
Occupational Therapy Session Note  Patient Details  Name: Wayne Buckley MRN: [id number] Date of Birth: 1950-10-01  Today's Date: 03/28/2018 OT Individual Time: 7829-5621 OT Individual Time Calculation (min): 69 min    Short Term Goals: Week 3:  OT Short Term Goal 1 (Week 3): STGs=LTGs due to ELOS   Skilled Therapeutic Interventions/Progress Updates:   Upon entering the room, pt supine in bed awaiting OT arrival. Pt agreeable to OT intervention this session. RN arriving to do I & O cath before therapist gets pt OOB. OT discussed plans for session with pt while he is getting cathed. Pt required encouragement for toilet transfer but encouraged so he knows what to expect when doing it with nursing. Pt performed supine >sit with min A to EOB. Pt standing into STEDY with mod lifting assistance and assisted to bathroom. Once in bathroom pt stating, " I have to go!" OT assisted pt with clothing management and pt having large BM. Pt having to stand from elevated STEDY seat multiple time for hygiene and clothing management as pt only standing for 30 seconds each time. Pt transferred into wheelchair and propelled self to sink for grooming tasks with supervision and min verbal cuing for sequencing and safety. Pt propelled wheelchair around hallway obstacles with increased time and min verbal cuing before returning to room. Chair alarm activated and call bell within reach.   Therapy Documentation Precautions:  Precautions Precautions: Fall, Cervical Precaution Comments: No brace Restrictions Weight Bearing Restrictions: No   Therapy/Group: Individual Therapy  [name] 03/28/2018, 10:40 AM

## 2018-03-29 ENCOUNTER — Inpatient Hospital Stay (HOSPITAL_COMMUNITY): Payer: Medicare Other | Admitting: Physical Therapy

## 2018-03-29 ENCOUNTER — Inpatient Hospital Stay (HOSPITAL_COMMUNITY): Payer: Medicare Other | Admitting: Occupational Therapy

## 2018-03-29 NOTE — Progress Notes (Signed)
Occupational Therapy Session Note  Patient Details  Name: Wayne Buckley MRN: [id number] Date of Birth: 06-29-1950  Today's Date: 03/29/2018 OT Individual Time: 7116-5790 OT Individual Time Calculation (min): 35 min (missed 10 min as therapist late for session due to another pt need)   Short Term Goals: Week 1:  OT Short Term Goal 1 (Week 1): Pt will perform toilet transfer with max A. OT Short Term Goal 1 - Progress (Week 1): Not met OT Short Term Goal 2 (Week 1): Pt will perform UB dressing with mod A.  OT Short Term Goal 2 - Progress (Week 1): Met OT Short Term Goal 3 (Week 1): Pt will engaged in 15 minutes of functional task with 1 rest break or less.  OT Short Term Goal 3 - Progress (Week 1): Not met Week 2:  OT Short Term Goal 1 (Week 2): Pt will perform toilet transfer with assist of 1. OT Short Term Goal 1 - Progress (Week 2): Not met(due to pt refusing to try toileting) OT Short Term Goal 2 (Week 2): Pt will perform LB dressing with mod A from bed level with modifications as needed. OT Short Term Goal 2 - Progress (Week 2): Met OT Short Term Goal 3 (Week 2): Pt will demonstrate long sitting with min A from bed. OT Short Term Goal 3 - Progress (Week 2): Not met Week 3:  OT Short Term Goal 1 (Week 3): STGs=LTGs due to ELOS      Skilled Therapeutic Interventions/Progress Updates:    Pt received in bed and pt stated nursing had just changed his brief. Pt agreeable to working on bathing and dressing.  From bed level, pt worked on long sitting with min A to forward flex at hips and actively pulling legs up in a figure 4 position. This allowed pt to reach his feet to wash and to don shorts over legs.   Pt continues to compensate his movement patterns by pulling up using his arms for most of his effort versus using his core and legs. Removed bed rails to avoid pt from grabbing and pulling on them.    Spent quite a bit of time with pt and educating his wife on: - bent knee rolling using  opposite arm to reach across body to roll fully onto his side.  - Bent knee sways to increase back flexibility.   -forward trunk flexion to use his core with pushing up through hands against the bed vs. Pulling on the rails. Pt tolerated the exercises well but did get a little dizzy.  Dizziness resolved quickly.  Pt sat up in bed in chair position to have head up higher to prepare for mobility for the next session.  Bed rails raised and pt in the room with his wife.    Therapy Documentation Precautions:  Precautions Precautions: Fall, Cervical Precaution Comments: No brace Restrictions Weight Bearing Restrictions: No  General OT Amount of Missed Time: 10 Minutes    Pain: no c/o pain   ADL:    Therapy/Group: Individual Therapy  [place] 03/29/2018, 1:32 PM

## 2018-03-29 NOTE — Progress Notes (Signed)
Occupational Therapy Session Note  Patient Details  Name: Wayne Buckley MRN: [id number] Date of Birth: April 26, 1951  Today's Date: 03/29/2018 OT Individual Time: 8295-6213 OT Individual Time Calculation (min): 73 min   Short Term Goals: Week 3:  OT Short Term Goal 1 (Week 3): STGs=LTGs due to ELOS     Skilled Therapeutic Interventions/Progress Updates:    Pt greeted in bed with no c/o pain. Declining bathing. Started tx with pt donning socks and shoes bedlevel. He was able to utilize reclined figure 4 with B LEs and don footwear with supervision! Afterwards supine<sit completed with steady assist with heavy use of bedrails. Sit<stand in [place] with Mod A and elevated bed. Pt completed oral care and handwashing while in [place], semi-perched. Pt able to remove B UE support on [place] bar for short windows of time, but majority of time required unilateral UE support for balance. Afterwards he transferred to w/c and was taken to dayroom. Worked on supported standing, anterior weight shifting (reaching for cards on table), and endurance while completing a card sorting activity in [place]. Max A sit<stand with manual facilitation for forward weight shifting. Longest standing window without rest 5 minutes. Continued to work on anterior weight shifts/reaching in addition to coordination and cervical extension during card matching activity using vertical card board. He required mod-max encouragement to complete all tasks at max level of independence, especially when fatigued. At end of session he was taken back to room via w/c and left with spouse. Safety belt fastened and call bell within reach.      Therapy Documentation Precautions:  Precautions Precautions: Fall, Cervical Precaution Comments: No brace Restrictions Weight Bearing Restrictions: No Vital Signs: Therapy Vitals Pulse Rate: 92 BP: 128/80 Patient Position (if appropriate): Lying Oxygen Therapy SpO2: 96 % O2 Device: Room Air Pain: He reported  pain in shoulders and back when reaching at times. Per pt, subsided with seated rest.    ADL:   Therapy/Group: Individual Therapy  [name] A Cornelious Diven 03/29/2018, 12:13 PM

## 2018-03-29 NOTE — NC FL2 (Signed)
[place] MEDICAID FL2 LEVEL OF CARE SCREENING TOOL     IDENTIFICATION  Patient Name: Wayne Buckley Birthdate: 31-Jul-1950 Sex: male Admission Date (Current Location): 03/05/2018  [facility] and [place] Number:  Producer, television/film/video and Address:  [facility], [address] [place], [place] [address]      Provider Number: [id number]  Attending Physician Name and Address:  Ranelle Oyster, MD  Relative Name and Phone Number:  Angelique Blonder, spouse, [phone]    Current Level of Care: Other (Comment)(Acute Inpatient Rehab ) Recommended Level of Care: Skilled Nursing Facility Prior Approval Number:    Date Approved/Denied:   PASRR Number: [id number]  Discharge Plan: SNF    Current Diagnoses: Patient Active Problem List   Diagnosis Date Noted  . Acute blood loss anemia   . Cord compression (HCC) 03/05/2018  . C5-C7 level spinal cord injury (HCC)   . Central cord syndrome (HCC)   . Neurogenic bladder   . Essential hypertension   . AKI (acute kidney injury) (HCC) 02/26/2018  . Bilateral leg weakness 02/26/2018  . Leg DVT (deep venous thromboembolism), acute, left (HCC) 02/26/2018  . Fall 02/25/2018  . Alcohol abuse 05/04/2012  . Hypokalemia 05/04/2012  . Urinary retention 05/04/2012  . Subdural hematoma, post-traumatic (HCC) 04/28/2012    Orientation RESPIRATION BLADDER Height & Weight     Self, Time, Situation, Place  Normal External catheter(I/O caths q 4-6 hours) Weight: 101.2 kg Height:  5\' 11"  (180.3 cm)  BEHAVIORAL SYMPTOMS/MOOD NEUROLOGICAL BOWEL NUTRITION STATUS      Incontinent Diet(Dys 3, thin liquids)  AMBULATORY STATUS COMMUNICATION OF NEEDS Skin   Total Care(non - ambulatory at this time) Verbally Surgical wounds                       Personal Care Assistance Level of Assistance  Bathing, Feeding, Dressing, Total care Bathing Assistance: Maximum assistance Feeding assistance: Limited assistance Dressing Assistance: Maximum  assistance Total Care Assistance: Maximum assistance   Functional Limitations Info    Sight Info: Adequate Hearing Info: Adequate Speech Info: Adequate    SPECIAL CARE FACTORS FREQUENCY  PT (By licensed PT), OT (By licensed OT)     PT Frequency: 5x/wk OT Frequency: 5x/wk            Contractures Contractures Info: Not present    Additional Factors Info  Code Status, Allergies Code Status Info: Full Allergies Info: NKDA           Current Medications (03/29/2018):  This is the current hospital active medication list Current Facility-Administered Medications  Medication Dose Route Frequency Provider Last Rate Last Dose  . acetaminophen (TYLENOL) tablet 650 mg  650 mg Oral Q6H PRN Charlton Amor, PA-C   650 mg at 03/24/18 2135  . amLODipine (NORVASC) tablet 10 mg  10 mg Oral Daily Charlton Amor, PA-C   10 mg at 03/29/18 8469  . apixaban (ELIQUIS) tablet 5 mg  5 mg Oral BID Ranelle Oyster, MD   5 mg at 03/29/18 6295  . benzonatate (TESSALON) capsule 200 mg  200 mg Oral BID PRN Charlton Amor, PA-C   200 mg at 03/11/18 2841  . bethanechol (URECHOLINE) tablet 25 mg  25 mg Oral QID Ranelle Oyster, MD   25 mg at 03/29/18 1214  . bisacodyl (DULCOLAX) suppository 10 mg  10 mg Rectal Q0600 Ranelle Oyster, MD   10 mg at 03/27/18 2131  . carvedilol (COREG)  tablet 12.5 mg  12.5 mg Oral BID WC AngiulliMcarthur Rossetti, PA-C   12.5 mg at 03/29/18 4098  . feeding supplement (ENSURE ENLIVE) (ENSURE ENLIVE) liquid 237 mL  237 mL Oral BID BM Charlton Amor, PA-C   237 mL at 03/29/18 0929  . guaiFENesin-dextromethorphan (ROBITUSSIN DM) 100-10 MG/5ML syrup 5 mL  5 mL Oral Q4H PRN Charlton Amor, PA-C   5 mL at 03/28/18 2100  . hydrALAZINE (APRESOLINE) tablet 100 mg  100 mg Oral Q8H Angiulli, Mcarthur Rossetti, PA-C   100 mg at 03/29/18 0533  . hydrochlorothiazide (HYDRODIURIL) tablet 25 mg  25 mg Oral Daily Charlton Amor, PA-C   25 mg at 03/29/18 1191  . Influenza vac split  quadrivalent PF (FLUZONE HIGH-DOSE) injection 0.5 mL  0.5 mL Intramuscular Tomorrow-1000 Faith Rogue T, MD      . lip balm (CARMEX) ointment 1 application  1 application Topical PRN Angiulli, Mcarthur Rossetti, PA-C      . potassium chloride SA (K-DUR,KLOR-CON) CR tablet 20 mEq  20 mEq Oral BID Ranelle Oyster, MD   20 mEq at 03/29/18 0927  . tamsulosin (FLOMAX) capsule 0.4 mg  0.4 mg Oral Daily Charlton Amor, PA-C   0.4 mg at 03/29/18 4782  . vitamin C (ASCORBIC ACID) tablet 1,000 mg  1,000 mg Oral BID Charlton Amor, PA-C   1,000 mg at 03/29/18 9562     Discharge Medications: Please see discharge summary for a list of discharge medications.  Relevant Imaging Results:  Relevant Lab Results:   Additional Information SSN: [id number]  Lashunta Frieden, LCSW

## 2018-03-29 NOTE — Progress Notes (Signed)
[facility] PHYSICAL MEDICINE & REHABILITATION     PROGRESS NOTE    Subjective/Complaints: Denies any new problems  ROS: Patient denies fever, rash, sore throat, blurred vision, nausea, vomiting, diarrhea, cough, shortness of breath or chest pain, joint or back pain, headache, or mood change.   Objective:   No results found. Recent Labs    03/28/18 0445  WBC 5.2  HGB 10.1*  HCT 31.8*  PLT 249   Recent Labs    03/28/18 0445  NA 136  K 3.4*  CL 102  GLUCOSE 99  BUN 11  CREATININE 0.47*  CALCIUM 8.8*   CBG (last 3)  No results for input(s): GLUCAP in the last 72 hours.  Wt Readings from Last 3 Encounters:  03/27/18 101.2 kg  03/02/18 99.8 kg  08/07/12 88.2 kg     Intake/Output Summary (Last 24 hours) at 03/29/2018 0844 Last data filed at 03/29/2018 0534 Gross per 24 hour  Intake 1520 ml  Output 2300 ml  Net -780 ml    Vital Signs: Blood pressure 129/68, pulse 83, temperature 99.3 F (37.4 C), temperature source Oral, resp. rate 12, height 5\' 11"  (1.803 m), weight 101.2 kg, SpO2 98 %. Physical Exam:  Constitutional: No distress . Vital signs reviewed. HEENT: EOMI, oral membranes moist Neck: supple Cardiovascular: RRR without murmur. No JVD    Respiratory: CTA Bilaterally without wheezes or rales. Normal effort    GI: BS +, non-tender, non-distended  Musc: Left lower extremity edema 1+ ---unchanged Neurological: He isalertand oriented Dysarthric speech persistent0 harder to hear this morning Motor: B/l UE 5 proximally, 3+/5 distally, stable.  Right lower extremity 4/5 proximal distal, no change.  LLE: 4-/5 proximal to distal ataxia Skin: Skin iswarm.  Intact. Psychiatric:cooperative  Assessment/Plan: 1. 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 Physiatrist is providing close team supervision and 24 hour management of  active medical problems listed below. Physiatrist and rehab team continue to assess barriers to discharge/monitor patient progress toward functional and medical goals.  Function:  Bathing Bathing position Bathing activity did not occur: Refused Position: Wheelchair/chair at sink  Bathing parts Body parts bathed by patient: Right arm, Left arm, Chest, Abdomen(UB only) Body parts bathed by helper: Right lower leg, Left lower leg, Back  Bathing assist Assist Level: Set up, Supervision or verbal cues      Upper Body Dressing/Undressing Upper body dressing Upper body dressing/undressing activity did not occur: Refused What is the patient wearing?: Pull over shirt/dress     Pull over shirt/dress - Perfomed by patient: Thread/unthread right sleeve, Thread/unthread left sleeve, Put head through opening Pull over shirt/dress - Perfomed by helper: Pull shirt over trunk        Upper body assist Assist Level: Touching or steadying assistance(Pt > 75%)      Lower Body Dressing/Undressing Lower body dressing Lower body dressing/undressing activity did not occur: Refused What is the patient wearing?: Pants, Ted Hose, Non-skid slipper socks       Pants- Performed by helper: Thread/unthread right pants leg, Thread/unthread left pants leg, Pull pants up/down(pt A with pulling up pants 50%)   Non-skid slipper socks- Performed by helper: Don/doff right sock, Don/doff left sock               TED Hose - Performed by helper: Don/doff right TED hose, Don/doff left TED hose  Lower body assist Assist for lower body dressing: (Total A)  Toileting Toileting Toileting activity did not occur: Refused   Toileting steps completed by helper: Adjust clothing after toileting    Toileting assist Assist level: Two helpers   Transfers Chair/bed transfer   Chair/bed transfer method: Lateral scoot Chair/bed transfer assist level: 2 helpers Chair/bed transfer assistive device: Sliding board,  Armrests Mechanical lift: Maximove   Locomotion Ambulation Ambulation activity did not occur: Safety/medical Investment banker, operational activity did not occur: Safety/medical concerns Type: Manual Max wheelchair distance: 20 Assist Level: Supervision or verbal cues  Cognition Comprehension Comprehension assist level: Understands basic 75 - 89% of the time/ requires cueing 10 - 24% of the time  Expression Expression assist level: Expresses basic 50 - 74% of the time/requires cueing 25 - 49% of the time. Needs to repeat parts of sentences.  Social Interaction Social Interaction assist level: Interacts appropriately 75 - 89% of the time - Needs redirection for appropriate language or to initiate interaction.  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 DVT Prophylaxis/Anticoagulation: Extensive left lower extremity DVT status post IVC filter 02/27/2018. No  bleeding on Eliquis  Hemoglobin 11.3 on 9/30--->10.4 10/7  -follow up hgb 10.6  10/8---recheck tomorrow 3. Pain Management:Tylenol as needed 4. Mood:Provide emotional support 5. Neuropsych: This patientiscapable of making decisions on hisown behalf. 6. Skin/Wound Care:Routine skin checks 7. Fluids/Electrolytes/Nutrition:  -encourage PO     -replacing potassium(3.4 10/10) 8.Leukocytosis.  Resolved  -Urine study negative 9.Hypertension. HCTZ 25 mg daily, Coreg 12.5mg  twice daily, Norvasc 10 mg daily,hydralazine 100 mg every 8 hours.   -no orthostasis  -BP controlled  10/11 10.BPH/neurogenic bladder -Flomax 0.4 mg daily -  I/O caths to keep volume between 300-500.  (close to range)   --increased urecholine to 25mg  QID schedule. No results yet   -pt with little insight into process     11.neurogenic bowel: -PM bowel program--inconsistent due to patient 12.History of alcohol use. Attempt to provide counseling   LOS (Days) 24 A FACE TO FACE EVALUATION WAS PERFORMED  Ranelle Oyster, MD 03/29/2018 8:44 AM

## 2018-03-29 NOTE — Plan of Care (Signed)
  Problem: RH SAFETY Goal: RH STG ADHERE TO SAFETY PRECAUTIONS W/ASSISTANCE/DEVICE Description STG Adhere to Safety Precautions With min Assistance/Device.  Outcome: Progressing Goal: RH STG DECREASED RISK OF FALL WITH ASSISTANCE Description STG Decreased Risk of Fall With min Assistance.  Outcome: Progressing  Call light within reach, bed/chair alarm, proper footwear.  

## 2018-03-29 NOTE — Progress Notes (Signed)
Physical Therapy Weekly Progress Note  Patient Details  Name: Wayne Buckley MRN: [id number] Date of Birth: 12-25-1950  Beginning of progress report period: March 22, 2018 End of progress report period: March 29, 2018  Today's Date: 03/29/2018 PT Individual Time: 1330-1444 PT Individual Time Calculation (min): 74 min   Patient has met 3 of 4 short term goals.  Pt continues to make slow, but steady, progress with basic functionally mobility.  He is currently able to transfer with a slide board with consistent mod assist for level transfers, and propel w/c with supervision consistently up to 75'.  He continues to require encouragement to participate fully during therapy sessions.  Continues to demo decreased ability for forward weight shift and LLE activation/stabilization for sit<>stand transfers and is unsafe to d/c home with his current level of function.   Patient continues to demonstrate the following deficits muscle weakness, impaired timing and sequencing, unbalanced muscle activation, motor apraxia, ataxia and decreased coordination, decreased visual perceptual skills and decreased visual motor skills, decreased attention, decreased awareness, decreased problem solving, decreased safety awareness, decreased memory and delayed processing and decreased sitting balance, decreased standing balance, decreased postural control and decreased balance strategies and therefore will continue to benefit from skilled PT intervention to increase functional independence with mobility.  Patient progressing toward long term goals..  Continue plan of care.  PT Short Term Goals Week 3:  PT Short Term Goal 1 (Week 3): Pt will verbalize 2 steps of slide board transfer with min question cues.  PT Short Term Goal 1 - Progress (Week 3): Not met PT Short Term Goal 2 (Week 3): Patient will tolerate supported standing for 5 minutes for improved weight bearing and activity tolerance  PT Short Term Goal 2 - Progress  (Week 3): Met PT Short Term Goal 3 (Week 3): Patient will initiate pre-gait activities including weight shifting in standing  PT Short Term Goal 3 - Progress (Week 3): Met PT Short Term Goal 4 (Week 3): Pt will propel w/c 40' with supervision.  PT Short Term Goal 4 - Progress (Week 3): Met Week 4:   =LTGs due to ELOS  Skilled Therapeutic Interventions/Progress Updates:    no c/o pain.  Session focus on functional mobility and NMR for coordination.  Pt propels w/c throughout unit, max distance 100', with supervision.  Slide board transfers to L and R throughout session with total assist for weight shifting and board placement, mod assist for lateral scoot.  Pt engaged in zoom ball 3x1 minutes, reporting dizziness with activity, though BP WNL, suspect possible vestibular component.  Sit<>supine with min assist for LLE onto mat/bed and for initiating trunk elevation.  3x10 reps bridging, and hook lying marching focus on motor control and pacing.  Hook lying trunk rotation stretch 2x45 seconds to R and L.  Kinetron 2x20 reps at 50 cm/s and 1x20 reps at 30 cm/s for forced use, reciprocal stepping pattern retraining, and coordination.  Attempted x1 viewing for vestibular exercise but pt unable to coordinate.  Returned to room at end of session and positioned back in bed, call bell in reach and needs met.   Therapy Documentation Precautions:  Precautions Precautions: Fall, Cervical Precaution Comments: No brace Restrictions Weight Bearing Restrictions: No   Therapy/Group: Individual Therapy  [name] 03/29/2018, 2:45 PM

## 2018-03-30 ENCOUNTER — Inpatient Hospital Stay (HOSPITAL_COMMUNITY): Payer: Medicare Other

## 2018-03-30 NOTE — Progress Notes (Signed)
[place] PHYSICAL MEDICINE & REHABILITATION     PROGRESS NOTE    Subjective/Complaints:  No issues overnight  ROS: Patient denies fever, rash, sore throat, blurred vision, nausea, vomiting, diarrhea, cough, shortness of breath or chest pain, joint or back pain, headache, or mood change.   Objective:   No results found. Recent Labs    03/28/18 0445  WBC 5.2  HGB 10.1*  HCT 31.8*  PLT 249   Recent Labs    03/28/18 0445  NA 136  K 3.4*  CL 102  GLUCOSE 99  BUN 11  CREATININE 0.47*  CALCIUM 8.8*   CBG (last 3)  No results for input(s): GLUCAP in the last 72 hours.  Wt Readings from Last 3 Encounters:  03/27/18 101.2 kg  03/02/18 99.8 kg  08/07/12 88.2 kg     Intake/Output Summary (Last 24 hours) at 03/30/2018 1244 Last data filed at 03/30/2018 0900 Gross per 24 hour  Intake 720 ml  Output 1450 ml  Net -730 ml    Vital Signs: Blood pressure 112/66, pulse 91, temperature 98 F (36.7 C), temperature source Oral, resp. rate 18, height 5\' 11"  (1.803 m), weight 101.2 kg, SpO2 96 %. Physical Exam:  Constitutional: No distress . Vital signs reviewed. HEENT: EOMI, oral membranes moist Neck: supple Cardiovascular: RRR without murmur. No JVD    Respiratory: CTA Bilaterally without wheezes or rales. Normal effort    GI: BS +, non-tender, non-distended  Musc: Left lower extremity edema 1+ ---unchanged Neurological: He isalertand oriented Dysarthric speech persistent0 harder to hear this morning Motor: B/l UE 5 proximally, 3+/5 distally, stable.  Right lower extremity 4/5 proximal distal, no change.  LLE: 4-/5 proximal to distal ataxia Skin: Skin iswarm.  Intact. Psychiatric:cooperative  Assessment/Plan: 1. 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 Physiatrist is providing close team supervision and 24 hour management of active  medical problems listed below. Physiatrist and rehab team continue to assess barriers to discharge/monitor patient progress toward functional and medical goals.  Function:  Bathing Bathing position Bathing activity did not occur: Refused Position: Wheelchair/chair at sink  Bathing parts Body parts bathed by patient: Right arm, Left arm, Chest, Abdomen(UB only) Body parts bathed by helper: Right lower leg, Left lower leg, Back  Bathing assist Assist Level: Set up, Supervision or verbal cues      Upper Body Dressing/Undressing Upper body dressing Upper body dressing/undressing activity did not occur: Refused What is the patient wearing?: Pull over shirt/dress     Pull over shirt/dress - Perfomed by patient: Thread/unthread right sleeve, Thread/unthread left sleeve, Put head through opening Pull over shirt/dress - Perfomed by helper: Pull shirt over trunk        Upper body assist Assist Level: Touching or steadying assistance(Pt > 75%)      Lower Body Dressing/Undressing Lower body dressing Lower body dressing/undressing activity did not occur: Refused What is the patient wearing?: Pants, Ted Hose, Non-skid slipper socks       Pants- Performed by helper: Thread/unthread right pants leg, Thread/unthread left pants leg, Pull pants up/down(pt A with pulling up pants 50%)   Non-skid slipper socks- Performed by helper: Don/doff right sock, Don/doff left sock               TED Hose - Performed by helper: Don/doff right TED hose, Don/doff left TED hose  Lower body assist Assist for lower body dressing: (Total A)  Toileting Toileting Toileting activity did not occur: Refused   Toileting steps completed by helper: Adjust clothing after toileting    Toileting assist Assist level: Two helpers   Transfers Chair/bed transfer   Chair/bed transfer method: Lateral scoot Chair/bed transfer assist level: 2 helpers Chair/bed transfer assistive device: Sliding board,  Armrests Mechanical lift: Maximove   Locomotion Ambulation Ambulation activity did not occur: Safety/medical Investment banker, operational activity did not occur: Safety/medical concerns Type: Manual Max wheelchair distance: 20 Assist Level: Supervision or verbal cues  Cognition Comprehension Comprehension assist level: Understands basic 75 - 89% of the time/ requires cueing 10 - 24% of the time  Expression Expression assist level: Expresses basic 50 - 74% of the time/requires cueing 25 - 49% of the time. Needs to repeat parts of sentences.  Social Interaction Social Interaction assist level: Interacts appropriately 75 - 89% of the time - Needs redirection for appropriate language or to initiate interaction.  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 DVT Prophylaxis/Anticoagulation: Extensive left lower extremity DVT status post IVC filter 02/27/2018. No  bleeding on Eliquis  Hemoglobin 11.3 on 9/30--->10.4 10/7  -follow up hgb 10.6  10/8---recheck tomorrow 3. Pain Management:Tylenol as needed 4. Mood:Provide emotional support 5. Neuropsych: This patientiscapable of making decisions on hisown behalf. 6. Skin/Wound Care:Routine skin checks 7. Fluids/Electrolytes/Nutrition:  -encourage PO     -replacing potassium(3.4 10/10) 8.Leukocytosis.  Resolved  -Urine study negative 9.Hypertension. HCTZ 25 mg daily, Coreg 12.5mg  twice daily, Norvasc 10 mg daily,hydralazine 100 mg every 8 hours.   -no orthostasis   Vitals:   03/29/18  2314 03/30/18 0512  BP: 121/80 112/66  Pulse: 84 91  Resp:  18  Temp:  98 F (36.7 C)  SpO2:  96%   10.BPH/neurogenic bladder -Flomax 0.4 mg daily -  I/O caths to keep volume between 300-500.  (close to range)   --increased urecholine to 25mg  QID schedule. No results yet   -pt with little insight into process   Discussed with patient's wife rationale for outpatient follow-up with urology including urodynamic testing equipment as well as cystoscopy that can be performed in the office but not at bedside 11.neurogenic bowel: -PM bowel program--inconsistent due to patient 12.History of alcohol use. Attempt to provide counseling   LOS (Days) 25 A FACE TO FACE EVALUATION WAS PERFORMED  Erick Colace, MD 03/30/2018 12:44 PM

## 2018-03-30 NOTE — Progress Notes (Signed)
Physical Therapy Session Note  Patient Details  Name: Wayne Buckley MRN: [id number] Date of Birth: Jan 23, 1951  Today's Date: 03/30/2018 PT Individual Time: 1000-1100 PT Individual Time Calculation (min): 60 min   Short Term Goals: Week 4: STG=LTG   Skilled Therapeutic Interventions/Progress Updates:    Pt supine in bed upon PT arrival, agreeable to therapy tx and denies pain. Therapist assisted to don teds and shorts, pt performed bridging to pull pants over hips. Pt rolled to R side and performed sidelying>sitting with mod assist. Pt performed slideboard transfer from bed>w/c with total assist to place board and max assist for transfer. Pt propelled w/c x 100 ft with B UEs and supervision. Pt performed slideboard transfer from w/c>mat with min assist. Pt performed x 4 sit<>stands from the mat this session, 3 musketeers with max assist +2. Pt able to maintain standing for 5-10 sec bouts with verbal cues for upright posture and manual facilitation for hip extension. Pt able to maintain standing x 20 second last trial. Therapist performed hamstring stretch and IR stretch 2 x 30 sec bilaterally. Pt transferred from sit<>supine with mod assist. Pt transferred to sidelying while therapist performed manual hip flexor stretch bilaterally. Pt transferred back to sitting and slideboard transfer to w/clateral scoot with min assist. Pt transported back to room and left in care of wife.   Therapy Documentation Precautions:  Precautions Precautions: Fall, Cervical Precaution Comments: No brace Restrictions Weight Bearing Restrictions: No    Therapy/Group: Individual Therapy  Cresenciano Genre, PT, DPT 03/30/2018, 10:35 AM

## 2018-03-31 ENCOUNTER — Inpatient Hospital Stay (HOSPITAL_COMMUNITY): Payer: Medicare Other | Admitting: Occupational Therapy

## 2018-03-31 MED ORDER — HYDRALAZINE HCL 50 MG PO TABS
50.0000 mg | ORAL_TABLET | Freq: Three times a day (TID) | ORAL | Status: DC
  Administered 2018-03-31 – 2018-04-04 (×11): 50 mg via ORAL
  Filled 2018-03-31 (×12): qty 1

## 2018-03-31 NOTE — Progress Notes (Signed)
At 0400, I & O cath=450cc's. BP this AM=90/70, spoke with Dr. Wynn Banker, hold AM dose.Alfredo Martinez A

## 2018-03-31 NOTE — Progress Notes (Addendum)
[facility] PHYSICAL MEDICINE & REHABILITATION     PROGRESS NOTE    Subjective/Complaints:  Denies any pain complaints today.  Patient states his bladder is doing okay however nursing indicates that he still receiving intermittent catheterization  Per RN several BP readings running in 90s, came up to  After hydralazine held  ROS: Patient denies, nausea, vomiting, diarrhea, cough, shortness of breath or chest pain, joint or back pain, headache, or mood change.  Patient has cognitive deficits which limit his ability to answer ROS ?s  Objective:   No results found. No results for input(s): WBC, HGB, HCT, PLT in the last 72 hours. No results for input(s): NA, K, CL, GLUCOSE, BUN, CREATININE, CALCIUM in the last 72 hours.  Invalid input(s): CO CBG (last 3)  No results for input(s): GLUCAP in the last 72 hours.  Wt Readings from Last 3 Encounters:  03/27/18 101.2 kg  03/02/18 99.8 kg  08/07/12 88.2 kg     Intake/Output Summary (Last 24 hours) at 03/31/2018 1050 Last data filed at 03/31/2018 0740 Gross per 24 hour  Intake 380 ml  Output 2090 ml  Net -1710 ml    Vital Signs: Blood pressure 125/74, pulse 78, temperature 98.3 F (36.8 C), temperature source Oral, resp. rate 18, height 5\' 11"  (1.803 m), weight 101.2 kg, SpO2 94 %. Physical Exam:  Constitutional: No distress . Vital signs reviewed. HEENT: EOMI, oral membranes moist Neck: supple Cardiovascular: RRR without murmur. No JVD    Respiratory: CTA Bilaterally without wheezes or rales. Normal effort    GI: BS +, non-tender, non-distended  Musc: Left lower extremity edema 1+ ---unchanged Neurological: He isalertand oriented Dysarthric speech persistent0 harder to hear this morning Motor: B/l UE 5 proximally, 3+/5 distally, stable.  Right lower extremity 4/5 proximal distal, no change.  LLE: 4-/5 proximal to distal ataxia Skin: Skin iswarm.  Intact. Psychiatric:cooperative  Assessment/Plan: 1.  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 Physiatrist is providing close team supervision and 24 hour management of active medical problems listed below. Physiatrist and rehab team continue to assess barriers to discharge/monitor patient progress toward functional and medical goals.  Function:  Bathing Bathing position Bathing activity did not occur: Refused Position: Wheelchair/chair at sink  Bathing parts Body parts bathed by patient: Right arm, Left arm, Chest, Abdomen(UB only) Body parts bathed by helper: Right lower leg, Left lower leg, Back  Bathing assist Assist Level: Set up, Supervision or verbal cues      Upper Body Dressing/Undressing Upper body dressing Upper body dressing/undressing activity did not occur: Refused What is the patient wearing?: Pull over shirt/dress     Pull over shirt/dress - Perfomed by patient: Thread/unthread right sleeve, Thread/unthread left sleeve, Put head through opening Pull over shirt/dress - Perfomed by helper: Pull shirt over trunk        Upper body assist Assist Level: Touching or steadying assistance(Pt > 75%)      Lower Body Dressing/Undressing Lower body dressing Lower body dressing/undressing activity did not occur: Refused What is the patient wearing?: Pants, Ted Hose, Non-skid slipper socks       Pants- Performed by helper: Thread/unthread right pants leg, Thread/unthread left pants leg, Pull pants up/down(pt A with pulling up pants 50%)   Non-skid slipper socks- Performed by helper: Don/doff right sock, Don/doff left sock               TED Hose -  Performed by helper: Don/doff right TED hose, Don/doff left TED hose  Lower body assist Assist for lower body dressing: (Total A)      Toileting Toileting Toileting activity did not occur: Refused   Toileting steps completed by helper: Adjust clothing  after toileting    Toileting assist Assist level: Two helpers   Transfers Chair/bed transfer   Chair/bed transfer method: Lateral scoot Chair/bed transfer assist level: 2 helpers Chair/bed transfer assistive device: Sliding board, Armrests Mechanical lift: Maximove   Locomotion Ambulation Ambulation activity did not occur: Safety/medical Investment banker, operational activity did not occur: Safety/medical concerns Type: Manual Max wheelchair distance: 20 Assist Level: Supervision or verbal cues  Cognition Comprehension Comprehension assist level: Understands basic 75 - 89% of the time/ requires cueing 10 - 24% of the time  Expression Expression assist level: Expresses basic 50 - 74% of the time/requires cueing 25 - 49% of the time. Needs to repeat parts of sentences.  Social Interaction Social Interaction assist level: Interacts appropriately 75 - 89% of the time - Needs redirection for appropriate language or to initiate interaction.  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 DVT Prophylaxis/Anticoagulation: Extensive left lower extremity DVT status post IVC filter 02/27/2018. No  bleeding on Eliquis  Hemoglobin 11.3 on 9/30--->10.4 10/7  -follow up hgb 10.6  10/8---recheck tomorrow 3. Pain Management:Tylenol as needed 4. Mood:Provide emotional support 5. Neuropsych: This patientiscapable of making decisions on hisown behalf. 6. Skin/Wound Care:Routine skin checks 7. Fluids/Electrolytes/Nutrition:  -encourage PO     -replacing  potassium(3.4 10/10) 8.Leukocytosis.  Resolved  -Urine study negative 9.Hypertension. HCTZ 25 mg daily, Coreg 12.5mg  twice daily, Norvasc 10 mg daily,hydralazine 100 mg every 8 hours.   Running lower will reduce hydralazine dose to 50mg    Vitals:   03/31/18 0350 03/31/18 0957  BP: 90/70 125/74  Pulse: 82 78  Resp: 18   Temp: 98.3 F (36.8 C)   SpO2: 94%    10.BPH/neurogenic bladder -Flomax 0.4 mg daily -  I/O caths to keep volume between 300-500.  (close to range)   --increased urecholine to 25mg  QID schedule. No results yet   -pt with little insight into process   Discussed with patient's wife rationale for outpatient follow-up with urology including urodynamic testing equipment as well as cystoscopy that can be performed in the office but not at bedside 11.neurogenic bowel: -PM bowel program--inconsistent due to patient 12.History of alcohol use. Attempt to provide counseling   LOS (Days) 26 A FACE TO FACE EVALUATION WAS PERFORMED  Erick Colace, MD 03/31/2018 10:50 AM

## 2018-03-31 NOTE — Progress Notes (Signed)
PRN robitussin DM given at 2040. Refused scheduled dulcolax supp. At 2000, no void, bladder scan=350cc's, I & O cath=400cc's. Alfredo Martinez A

## 2018-03-31 NOTE — Progress Notes (Signed)
Occupational Therapy Session Note  Patient Details  Name: Wayne Buckley MRN: [id number] Date of Birth: Jul 28, 1950  Today's Date: 03/31/2018 OT Group Time: 1100-1200 OT Group Time Calculation (min): 60 min  Skilled Therapeutic Interventions/Progress Updates:    Pt engaged in therapeutic w/c level dance group focusing on patient choice, UE/LE strengthening, salience, activity tolerance, and social participation. Pt was guided through various dance-based exercises involving UEs/LEs and trunk. All music was selected by group members. Emphasis placed on coordination and endurance. Encouragement required for social interaction and sustained participation, however pt requested songs and exhibited brightened affect in social environment. At end of session pt was escorted back to room by RT.    Therapy Documentation Precautions:  Precautions Precautions: Fall, Cervical Precaution Comments: No brace Restrictions Weight Bearing Restrictions: No Vital Signs: Therapy Vitals Pulse Rate: 78 BP: 125/74 Patient Position (if appropriate): Lying Pain: No s/s pain during session  Pain Assessment Pain Scale: 0-10 Pain Score: 0-No pain ADL:        Therapy/Group: Group Therapy  Jewell Haught A Petar Mucci 03/31/2018, 12:33 PM

## 2018-04-01 ENCOUNTER — Inpatient Hospital Stay (HOSPITAL_COMMUNITY): Payer: Medicare Other | Admitting: Occupational Therapy

## 2018-04-01 ENCOUNTER — Inpatient Hospital Stay (HOSPITAL_COMMUNITY): Payer: Medicare Other | Admitting: Physical Therapy

## 2018-04-01 NOTE — Progress Notes (Signed)
Occupational Therapy Session Note  Patient Details  Name: Wayne Buckley MRN: [id number] Date of Birth: Mar 01, 1951  Today's Date: 04/01/2018 OT Missed Time: 75 Minutes Missed Time Reason: Patient unwilling/refused to participate without medical reason   Short Term Goals: Week 3:  OT Short Term Goal 1 (Week 3): STGs=LTGs due to ELOS  Skilled Therapeutic Interventions/Progress Updates:    Skilled Therapeutic Interventions/Progress Updates:    Pt greeted in bed with report of nausea and trouble sleeping. Adamantly refusing tx participation. Provided encouragement to engage in self care or EOB therapies, however pt still declined, becoming agitated by continued encouragement. 75 minutes missed.  Therapy Documentation Precautions:  Precautions Precautions: Fall, Cervical Precaution Comments: No brace Restrictions Weight Bearing Restrictions: No General: General OT Amount of Missed Time: 75 Minutes PT Missed Treatment Reason: Patient unwilling to participate Pain: No c/o pain, just nausea. Declined aromatherapy interventions to address.     ADL:    Therapy/Group: Individual Therapy  [name] A Hy Swiatek 04/01/2018, 11:58 AM

## 2018-04-01 NOTE — Progress Notes (Signed)
Refused scheduled supp., explained rationale, but continued to refuse. 2 incontinent BM's during night. At 2300, bladder scan=150cc's and at 0100=225. At 0400, I & O cath=550. PRN robitussin DM given per patient's request at 2132. 2 (+) edema to LLE. Alfredo Martinez A

## 2018-04-01 NOTE — Progress Notes (Signed)
[place] PHYSICAL MEDICINE & REHABILITATION     PROGRESS NOTE    Subjective/Complaints:  Same issues with insight/awareness ongoing.   ROS: Patient denies fever, rash, sore throat, blurred vision, nausea, vomiting, diarrhea, cough, shortness of breath or chest pain, joint or back pain, headache, or mood change.   Objective:   No results found. No results for input(s): WBC, HGB, HCT, PLT in the last 72 hours. No results for input(s): NA, K, CL, GLUCOSE, BUN, CREATININE, CALCIUM in the last 72 hours.  Invalid input(s): CO CBG (last 3)  No results for input(s): GLUCAP in the last 72 hours.  Wt Readings from Last 3 Encounters:  03/27/18 101.2 kg  03/02/18 99.8 kg  08/07/12 88.2 kg     Intake/Output Summary (Last 24 hours) at 04/01/2018 0853 Last data filed at 04/01/2018 0400 Gross per 24 hour  Intake 820 ml  Output 1650 ml  Net -830 ml    Vital Signs: Blood pressure (!) 124/91, pulse 85, temperature 98.3 F (36.8 C), temperature source Oral, resp. rate 18, height 5\' 11"  (1.803 m), weight 101.2 kg, SpO2 96 %. Physical Exam:  Constitutional: No distress . Vital signs reviewed. HEENT: EOMI, oral membranes moist Neck: supple Cardiovascular: RRR without murmur. No JVD    Respiratory: CTA Bilaterally without wheezes or rales. Normal effort    GI: BS +, non-tender, non-distended  Musc: Left lower extremity edema 1+stable Neurological: He isalertand oriented Dysarthric speech persistent0 harder to hear this morning Motor: B/l UE 5 proximally, 3+/5 distally, stable.  Right lower extremity 4/5 proximal distal, stable.  LLE: 4-/5 proximal to distal ataxia Skin: Skin iswarm.  Intact. Psychiatric:cooperative  Assessment/Plan: 1. 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 Physiatrist is providing close team supervision and 24 hour management of  active medical problems listed below. Physiatrist and rehab team continue to assess barriers to discharge/monitor patient progress toward functional and medical goals.  Function:  Bathing Bathing position Bathing activity did not occur: Refused Position: Wheelchair/chair at sink  Bathing parts Body parts bathed by patient: Right arm, Left arm, Chest, Abdomen(UB only) Body parts bathed by helper: Right lower leg, Left lower leg, Back  Bathing assist Assist Level: Set up, Supervision or verbal cues      Upper Body Dressing/Undressing Upper body dressing Upper body dressing/undressing activity did not occur: Refused What is the patient wearing?: Pull over shirt/dress     Pull over shirt/dress - Perfomed by patient: Thread/unthread right sleeve, Thread/unthread left sleeve, Put head through opening Pull over shirt/dress - Perfomed by helper: Pull shirt over trunk        Upper body assist Assist Level: Touching or steadying assistance(Pt > 75%)      Lower Body Dressing/Undressing Lower body dressing Lower body dressing/undressing activity did not occur: Refused What is the patient wearing?: Pants, Ted Hose, Non-skid slipper socks       Pants- Performed by helper: Thread/unthread right pants leg, Thread/unthread left pants leg, Pull pants up/down(pt A with pulling up pants 50%)   Non-skid slipper socks- Performed by helper: Don/doff right sock, Don/doff left sock               TED Hose - Performed by helper: Don/doff right TED hose, Don/doff left TED hose  Lower body assist Assist for lower body dressing: (Total A)      Toileting Toileting Toileting activity did not occur: Refused   Toileting steps  completed by helper: Adjust clothing after toileting    Toileting assist Assist level: Two helpers   Transfers Chair/bed transfer   Chair/bed transfer method: Lateral scoot Chair/bed transfer assist level: 2 helpers Chair/bed transfer assistive device: Sliding board,  Armrests Mechanical lift: Maximove   Locomotion Ambulation Ambulation activity did not occur: Safety/medical Investment banker, operational activity did not occur: Safety/medical concerns Type: Manual Max wheelchair distance: 20 Assist Level: Supervision or verbal cues  Cognition Comprehension Comprehension assist level: Understands basic 75 - 89% of the time/ requires cueing 10 - 24% of the time  Expression Expression assist level: Expresses basic 50 - 74% of the time/requires cueing 25 - 49% of the time. Needs to repeat parts of sentences.  Social Interaction Social Interaction assist level: Interacts appropriately 75 - 89% of the time - Needs redirection for appropriate language or to initiate interaction.  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 DVT Prophylaxis/Anticoagulation: Extensive left lower extremity DVT status post IVC filter 02/27/2018. No  bleeding on Eliquis  Hemoglobin 11.3 on 9/30--->10.4 10/7  -follow up hgb 10.6  10/8---recheck 10/14 3. Pain Management:Tylenol as needed 4. Mood:Provide emotional support 5. Neuropsych: This patientiscapable of making decisions on hisown behalf. 6. Skin/Wound Care:Routine skin checks 7. Fluids/Electrolytes/Nutrition:  -encourage PO     -replacing potassium(3.4 10/10) 8.Leukocytosis.  Resolved  -Urine study negative 9.Hypertension. HCTZ 25 mg daily, Coreg 12.5mg  twice daily, Norvasc 10 mg daily,hydralazine 100 mg every 8 hours.   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  (close to range)   --  Urecholine 25mg  QID without results yet----will hold for now   -pt with little insight into process   Dr. 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 Attempt to provide counseling   LOS (Days) 27 A FACE TO FACE EVALUATION WAS PERFORMED  Ranelle Oyster, MD 04/01/2018 8:53 AM

## 2018-04-01 NOTE — Progress Notes (Signed)
Occupational Therapy Note  Patient Details  Name: Wayne Buckley MRN: [id number] Date of Birth: 04-13-1951  Today's Date: 04/01/2018 OT Missed Time: 60 Minutes Missed Time Reason: Patient unwilling/refused to participate without medical reason  Pt refusing OT intervention and states, " I'm taking the day off. I did not sleep well." OT continues to encourage pt but he declines further. Pt remained supine in bed with bed alarm activated and call bell within reach.    Jackquline Denmark P 04/01/2018, 1:08 PM

## 2018-04-01 NOTE — Progress Notes (Signed)
Physical Therapy Session Note  Patient Details  Name: MAVERYK RENSTROM MRN: [id number] Date of Birth: March 18, 1951  Today's Date: 04/01/2018 PT Individual Time: 1100-1115 PT Individual Time Calculation (min): 15 min   Short Term Goals: Week 4:   =LTGs due to ELOS  Skilled Therapeutic Interventions/Progress Updates:    no c/o pain but declining therapy.  States he hasn't slept well and he prefers to sleep in.  Encouraged pt to participate in therapy to maximize independence prior to d/c to next level of care, reviewed PT goals, and progress towards goals, as well as importance of participation to prevent regression in mobility.  Pt becoming agitated with therapist shouting, "I said no!" Remains supine in bed with call bell in reach and needs met.  Missed 45 minutes of skilled PT.   Therapy Documentation Precautions:  Precautions Precautions: Fall, Cervical Precaution Comments: No brace Restrictions Weight Bearing Restrictions: No General: PT Amount of Missed Time (min): 45 Minutes PT Missed Treatment Reason: Patient unwilling to participate    Therapy/Group: Individual Therapy  [name] 04/01/2018, 11:29 AM

## 2018-04-02 ENCOUNTER — Inpatient Hospital Stay (HOSPITAL_COMMUNITY): Payer: Medicare Other | Admitting: Physical Therapy

## 2018-04-02 ENCOUNTER — Inpatient Hospital Stay (HOSPITAL_COMMUNITY): Payer: Medicare Other | Admitting: Occupational Therapy

## 2018-04-02 NOTE — Progress Notes (Signed)
Physical Therapy Session Note  Patient Details  Name: Wayne Buckley MRN: [id number] Date of Birth: April 29, 1951  Today's Date: 04/02/2018 PT Individual Time: 1600-1640 PT Individual Time Calculation (min): 40 min   Short Term Goals: Week 4:   =LTGs due to ELOS  Skilled Therapeutic Interventions/Progress Updates:    Pt in bed with no c/o pain.  Session focus on activity tolerance and participation in therapy.  Pt requiring increased cues this session for all activities with noted significant difficulty with sustained and focused attention.    Pt requires more than a reasonable amount of time and min assist to don shoes from bed level with HOB maximally elevated and pillows behind to achieve modified circle sit.  Supine>sit with HOB elevated and bed rails, min assist and more than a reasonable amount of time with max multimodal cues to come to squarely EOB.  Stedy transfer to w/c with mod assist to transition to standing from elevated EOB.  Pt requires max cues for trunk control in stedy.  When seated in w/c therapist asked if pt wanted egg crates behind back (and showed them to him) and pt adamant that they are already positioned behind him despite them being in the therapist's hands.  Standing tolerance in standing frame 1 trial of 3 minutes while engaged in graded peg board task for UE coordination and trunk control.  On second attempt to stand pt only standing for 30 seconds before c/o dizzy and not able to stand any longer.  Wife suggesting that LEs were not positioned optimally for equal weight bearing, however, on obseration they were positioned in a slight split stance with the toe of the RLE at the posterior level of the arch of the LLE.  Pt returned to room at end of session.  Stedy used to transfer back to bed with mod assist for LEs, pt unable to transition to side lying despite total multimodal cues.  Left in care of nursing staff.   Therapy Documentation Precautions:   Precautions Precautions: Fall, Cervical Precaution Comments: No brace Restrictions Weight Bearing Restrictions: No    Therapy/Group: Individual Therapy  Stephania Fragmin 04/02/2018, 4:48 PM

## 2018-04-02 NOTE — Progress Notes (Signed)
Physical Therapy Session Note  Patient Details  Name: Wayne Buckley MRN: [id number] Date of Birth: 24-Sep-1950  Today's Date: 04/02/2018 PT Individual Time: 1300-1400 PT Individual Time Calculation (min): 60 min   Short Term Goals: Week 4:   =LTGs due to ELOS  Skilled Therapeutic Interventions/Progress Updates:    no c/o pain.  States he did not refuse therapy this morning but that he "needed to sleep in." Session focus on activity tolerance and motor control.    W/C propulsion to therapy gym with 1 rest break at nursing station.  Max encouragement to increase distance.  UE strengthening/coordination tasks with 3x10 reps and 2# ball for throwing/catching with rebounder, shoulder press, and chest press.  Attempted forward trunk flexion and return upright with 2# ball for trunk extension strengthening but pt reports dizziness.  Attempted shoulder diagonals with 2# ball to R and L but pt refusing stating it hurts his shoulders and makes him dizzy.  Transition to nustep, requires +2 for slide board transfer w/c<>nustep with max multimodal cues for sequencing and safety.  Nustep 3x1 minute at level 6 for reciprocal movement pattern retraining, forced use, coordination, and overall activity tolerance.  Pt requires tactile cues to maintain neutral hip rotation and max encouragement from therapist and wife to reach 1 minute target.  Pt frequently becoming frustrated with encouragement during session stating it's hard and "you do it then!"  Pt returned to room at end of session and positioned upright in w/c with chair alarm intact, call bell in reach and needs met.   Therapy Documentation Precautions:  Precautions Precautions: Fall, Cervical Precaution Comments: No brace Restrictions Weight Bearing Restrictions: No    Therapy/Group: Individual Therapy  [name] 04/02/2018, 3:54 PM

## 2018-04-02 NOTE — Progress Notes (Signed)
Physical Therapy Session Note  Patient Details  Name: Wayne Buckley MRN: [id number] Date of Birth: 1950-11-15  Today's Date: 04/02/2018 PT Missed Time: 60 Minutes Missed Time Reason: Patient unwilling to participate  Pt unwilling to participate in therapy this morning despite encouragement, states he is still sick, didn't sleep well, and stated "I will see you tomorrow".   Lucia Mccreadie Melton Krebs 04/02/2018, 9:19 AM

## 2018-04-02 NOTE — Progress Notes (Signed)
[place] PHYSICAL MEDICINE & REHABILITATION     PROGRESS NOTE    Subjective/Complaints:  Slept, no issues this morning until therapy attempted to work with him.   ROS: Limited due to cognitive/behavioral   Objective:   No results found. No results for input(s): WBC, HGB, HCT, PLT in the last 72 hours. No results for input(s): NA, K, CL, GLUCOSE, BUN, CREATININE, CALCIUM in the last 72 hours.  Invalid input(s): CO CBG (last 3)  No results for input(s): GLUCAP in the last 72 hours.  Wt Readings from Last 3 Encounters:  03/27/18 101.2 kg  03/02/18 99.8 kg  08/07/12 88.2 kg     Intake/Output Summary (Last 24 hours) at 04/02/2018 0906 Last data filed at 04/02/2018 0827 Gross per 24 hour  Intake 480 ml  Output 2165 ml  Net -1685 ml    Vital Signs: Blood pressure 122/77, pulse 81, temperature 97.8 F (36.6 C), resp. rate 18, height 5\' 11"  (1.803 m), weight 101.2 kg, SpO2 97 %. Physical Exam:  Constitutional: No distress . Vital signs reviewed. HEENT: EOMI, oral membranes moist Neck: supple Cardiovascular: RRR without murmur. No JVD    Respiratory: CTA Bilaterally without wheezes or rales. Normal effort    GI: BS +, non-tender, non-distended  Musc: Left lower extremity edema 1+ no change Neurological: He isalertand oriented Dysarthric speech persistent0 harder to hear this morning Motor: B/l UE 5 proximally, 3+/5 distally, stab.  Right lower extremity 4/5 proximal distal, stab.  LLE: 4-/5 proximal to distal ataxia Skin: Skin iswarm.  Intact. Psychiatric:cooperative with exam  Assessment/Plan: 1. 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 Physiatrist is providing close team supervision and 24 hour management of active medical problems listed below. Physiatrist and rehab team continue to assess barriers to discharge/monitor patient  progress toward functional and medical goals.  Function:  Bathing Bathing position Bathing activity did not occur: Refused Position: Wheelchair/chair at sink  Bathing parts Body parts bathed by patient: Right arm, Left arm, Chest, Abdomen(UB only) Body parts bathed by helper: Right lower leg, Left lower leg, Back  Bathing assist Assist Level: Set up, Supervision or verbal cues      Upper Body Dressing/Undressing Upper body dressing Upper body dressing/undressing activity did not occur: Refused What is the patient wearing?: Pull over shirt/dress     Pull over shirt/dress - Perfomed by patient: Thread/unthread right sleeve, Thread/unthread left sleeve, Put head through opening Pull over shirt/dress - Perfomed by helper: Pull shirt over trunk        Upper body assist Assist Level: Touching or steadying assistance(Pt > 75%)      Lower Body Dressing/Undressing Lower body dressing Lower body dressing/undressing activity did not occur: Refused What is the patient wearing?: Pants, Ted Hose, Non-skid slipper socks       Pants- Performed by helper: Thread/unthread right pants leg, Thread/unthread left pants leg, Pull pants up/down(pt A with pulling up pants 50%)   Non-skid slipper socks- Performed by helper: Don/doff right sock, Don/doff left sock               TED Hose - Performed by helper: Don/doff right TED hose, Don/doff left TED hose  Lower body assist Assist for lower body dressing: (Total A)      Toileting Toileting Toileting activity did not occur: Refused   Toileting steps completed by helper: Adjust clothing after toileting    Toileting assist Assist level: Two  helpers   Transfers Chair/bed transfer   Chair/bed transfer method: Lateral scoot Chair/bed transfer assist level: 2 helpers Chair/bed transfer assistive device: Sliding board, Armrests Mechanical lift: Museum/gallery exhibitions officer activity did not occur: Clinical research associate activity did not occur: Safety/medical concerns Type: Manual Max wheelchair distance: 20 Assist Level: Supervision or verbal cues  Cognition Comprehension Comprehension assist level: Understands basic 75 - 89% of the time/ requires cueing 10 - 24% of the time  Expression Expression assist level: Expresses basic 50 - 74% of the time/requires cueing 25 - 49% of the time. Needs to repeat parts of sentences.  Social Interaction Social Interaction assist level: Interacts appropriately 75 - 89% of the time - Needs redirection for appropriate language or to initiate interaction.  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 DVT Prophylaxis/Anticoagulation: Extensive left lower extremity DVT status post IVC filter 02/27/2018. No  bleeding on Eliquis  Hemoglobin 11.3 on 9/30--->10.4 10/7  -follow up hgb 10.6  10/8---re-check cbc 10/16 3. Pain Management:Tylenol as needed 4. Mood:Provide emotional support 5. Neuropsych: This patientiscapable of making decisions on hisown behalf. 6. Skin/Wound Care:Routine skin checks 7. Fluids/Electrolytes/Nutrition:  -encourage PO     -replacing potassium(3.4 10/10)--follow up tomorrow 8.Leukocytosis.  Resolved  -Urine study negative 9.Hypertension. 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  (close to range)   --  Urecholine 25mg  QID without results yet----will hold for now   -pt with little insight into process   Dr. 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 Attempt to provide counseling   LOS (Days) 28 A FACE TO FACE EVALUATION WAS PERFORMED  Ranelle Oyster, MD 04/02/2018 9:06 AM

## 2018-04-02 NOTE — Progress Notes (Signed)
Occupational Therapy Session Note  Patient Details  Name: Wayne Buckley MRN: [id number] Date of Birth: 1950-06-28  Today's Date: 04/02/2018 OT Individual Time: 4098-1191 OT Individual Time Calculation (min): 73 min    Short Term Goals: Week 3:  OT Short Term Goal 1 (Week 3): STGs=LTGs due to ELOS  Skilled Therapeutic Interventions/Progress Updates:    Upon entering the room,  Pt supine in bed with wife present in room with no c/o pain. OT discussed pt's lack of participation over the last two days as pt refused all therapy sessions and wife making excuses for pt's lack of participation. Pt rolling towards therapist to don pants and has been incontinent of BM. Pt verbalized knowing he had been incontinent but did not tell anyone. OT educated pt on concerns regarding skin breakdown. Pt refused toilet transfer but second helper appeared and assisted pt to toilet via stedy for safety. Pt emptying further on toilet and remainder of clothing doffed while seated on commode. Pt transferred onto roll in shower chair with STEDY and mod lifting assistance to stand in equipment. Pt remained seated with mod multimodal cuing for thoroughness and sequencing. Pt donning clothing items while seated in wheelchair and standing in stedy with assistance to pull pants over B hips. Pt returning to seated position in wheelchair for grooming tasks at sink with set up A to obtain needed items. Pt remained in wheelchair with call bell and all needed items at end of session.   Therapy Documentation Precautions:  Precautions Precautions: Fall, Cervical Precaution Comments: No brace Restrictions Weight Bearing Restrictions: No General: General PT Missed Treatment Reason: Patient unwilling to participate   Therapy/Group: Individual Therapy  [name] 04/02/2018, 12:43 PM

## 2018-04-03 ENCOUNTER — Inpatient Hospital Stay (HOSPITAL_COMMUNITY): Payer: Medicare Other | Admitting: Occupational Therapy

## 2018-04-03 ENCOUNTER — Inpatient Hospital Stay (HOSPITAL_COMMUNITY): Payer: Medicare Other | Admitting: Physical Therapy

## 2018-04-03 ENCOUNTER — Inpatient Hospital Stay (HOSPITAL_COMMUNITY): Payer: Medicare Other

## 2018-04-03 LAB — BASIC METABOLIC PANEL
ANION GAP: 7 (ref 5–15)
BUN: 10 mg/dL (ref 8–23)
CO2: 28 mmol/L (ref 22–32)
Calcium: 9.3 mg/dL (ref 8.9–10.3)
Chloride: 101 mmol/L (ref 98–111)
Creatinine, Ser: 0.62 mg/dL (ref 0.61–1.24)
GLUCOSE: 98 mg/dL (ref 70–99)
POTASSIUM: 3.7 mmol/L (ref 3.5–5.1)
Sodium: 136 mmol/L (ref 135–145)

## 2018-04-03 LAB — CBC
HCT: 35.5 % — ABNORMAL LOW (ref 39.0–52.0)
HEMOGLOBIN: 11.1 g/dL — AB (ref 13.0–17.0)
MCH: 28.6 pg (ref 26.0–34.0)
MCHC: 31.3 g/dL (ref 30.0–36.0)
MCV: 91.5 fL (ref 80.0–100.0)
NRBC: 0 % (ref 0.0–0.2)
PLATELETS: 619 10*3/uL — AB (ref 150–400)
RBC: 3.88 MIL/uL — AB (ref 4.22–5.81)
RDW: 14.4 % (ref 11.5–15.5)
WBC: 6.5 10*3/uL (ref 4.0–10.5)

## 2018-04-03 MED ORDER — HYDRALAZINE HCL 50 MG PO TABS: 50.0000 mg | ORAL_TABLET | Freq: Three times a day (TID) | ORAL | Status: DC

## 2018-04-03 MED ORDER — CARVEDILOL 12.5 MG PO TABS: 12.5000 mg | ORAL_TABLET | Freq: Two times a day (BID) | ORAL | Status: DC

## 2018-04-03 MED ORDER — ACETAMINOPHEN 325 MG PO TABS: 650.0000 mg | ORAL_TABLET | Freq: Four times a day (QID) | ORAL | Status: AC | PRN

## 2018-04-03 MED ORDER — POTASSIUM CHLORIDE CRYS ER 20 MEQ PO TBCR: 20.0000 meq | EXTENDED_RELEASE_TABLET | Freq: Two times a day (BID) | ORAL | Status: DC

## 2018-04-03 MED ORDER — APIXABAN 5 MG PO TABS: 5.0000 mg | ORAL_TABLET | Freq: Two times a day (BID) | ORAL | Status: AC

## 2018-04-03 MED ORDER — HYDROCHLOROTHIAZIDE 25 MG PO TABS: 25.0000 mg | ORAL_TABLET | Freq: Every day | ORAL | Status: DC

## 2018-04-03 MED ORDER — BISACODYL 10 MG RE SUPP: 10.0000 mg | Freq: Every day | RECTAL | 0 refills | Status: AC

## 2018-04-03 MED ORDER — ASCORBIC ACID 1000 MG PO TABS: 1000.0000 mg | ORAL_TABLET | Freq: Two times a day (BID) | ORAL | Status: AC

## 2018-04-03 NOTE — Progress Notes (Signed)
Physical Therapy Session Note  Patient Details  Name: Wayne Buckley MRN: [id number] Date of Birth: 08/30/1950  Today's Date: 04/03/2018 PT Individual Time: 4098-1191 PT Individual Time Calculation (min): 57 min   Short Term Goals:  Week 4: = LTGs due to ELOS     Skilled Therapeutic Interventions/Progress Updates:   Pt in bed; wife stated that she thought he was not having any more sessions today.  She encourage pt to participate, and pt complied.  neuromuscular re-education via multimodal cues for bil bridging, bil lower trunk rotation with extended holds at end range for stretching, R/L heel slides focusing on smoothness of movement and neutral hip rotation, bil hip internal rotation, with use of gait belt around distal thighs for prolonged stretch bil hip external rotators.  Pt stated that the hip stretching felt good.  R/L shoulder protraction focusing on muscle isolation, min assist to quiet triceps and use shoulder muscles.    PT educated wife in the role that narrow BOS plays in pt's balance.  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  Rolling L><R with set-up and min tactile cues, without use of railings.  PT instructed wife in facilitating movement in bed.   In R/L side lying with knees and hips flexed, L/R hip abduction via clam shells x 10 each.  Pt left resting in bed with wife present and needs at hand.  Pt's wife requested hand- outs of strengthening/coordination and stretching exs.     Therapy Documentation Precautions:  Precautions Precautions: Fall Precaution Comments: no brace needed, pt very ataxic Restrictions Weight Bearing Restrictions: No  Pain: Pain Assessment Pt denies pain         Therapy/Group: Individual Therapy  Cherelle Midkiff 04/03/2018, 4:06 PM

## 2018-04-03 NOTE — Progress Notes (Signed)
Social Work Patient ID: Wayne Buckley, male   DOB: [birth year], 67 y.o.   MRN: [id number]  Have received SNF bed offer today from [facility] who can admit pt tomorrow.  Pt and wife accepting bed.  Team aware.  Isa Hitz, LCSW

## 2018-04-03 NOTE — Plan of Care (Signed)
  Problem: RH Balance Goal: LTG Patient will maintain dynamic sitting balance (PT) Description LTG:  Patient will maintain dynamic sitting balance with assistance during mobility activities (PT) Outcome: Not Met (add Reason)   Problem: RH Bed to Chair Transfers Goal: LTG Patient will perform bed/chair transfers w/assist (PT) Description LTG: Patient will perform bed to chair transfers with assistance (PT). Outcome: Adequate for Discharge Note:  Can require up to +2 assist depending on fatigue and motivation, at best requires mod assist

## 2018-04-03 NOTE — Progress Notes (Signed)
Occupational Therapy Discharge Summary  Patient Details  Name: Wayne Buckley MRN: [id number] Date of Birth: 06-25-1950  Today's Date: 04/03/2018 OT Individual Time: 4656-8127 and 1500-1555 OT Individual Time Calculation (min): 38 min and 55 min    Patient has met 9 of 9 long term goals due to improved activity tolerance, improved balance and ability to compensate for deficits.  Patient to discharge at overall S- max A level.   Reasons goals not met: all goals met  Recommendation:  Patient will benefit from ongoing skilled OT services in skilled nursing facility setting to continue to advance functional skills in the area of BADL and Reduce care partner burden.  Equipment: defer to next venue of care  Reasons for discharge: treatment goals met  Patient/family agrees with progress made and goals achieved: Yes   OT Intervention: Session 1: This is second attempt to see pt this morning. Pt agreeable to OT intervention with wife present in room. Pt performed bed mobility with min A to EOB and min verbal cuing. Pt standing from bed into STEDY with mod lifting assistance and transferred onto commode chair for toileting need. Pt able to have BM and void urine. NT arrived to bladder scan to check for emptying. Pt required assistance for hygiene and returning to sit in wheelchair with use of STEDY. Pt donning B shoes and socks with figure four position and assistance to hold LE up while placing clothing onto feet. Pt remained in wheelchair with call bell and all needed items within reach.   Session 2: Upon entering the room, pt seated in wheelchair with nursing present and giving medications. Pt agreeable to OT intervention and requesting to shower again. Wife requesting OT attempt toileting again with use of STEDY but pt unable to void. Clothing removed while seated on toilet for safety. Pt transferred onto shower seat and required max multimodal cuing for attention, sequencing, and initiating task.  Pt required min A overall with bathing. Pt donning pull over shirt with set up A and increased time. Pt seated on elevated stedy seat for grooming needs with supervision - stedy assistance for balance x 10 minutes. Pt returning to bed with mod A for sit >supine. Bed alarm activated and call bell within reach upon exiting the room.   OT Discharge Precautions/Restrictions  Precautions Precautions: Fall Precaution Comments: no brace needed, pt very ataxic Restrictions Weight Bearing Restrictions: No Vital Signs Therapy Vitals Pulse Rate: 88 BP: 119/78 Patient Position (if appropriate): Sitting Pain Pain Assessment Pain Scale: 0-10 Pain Score: 0-No pain Pain Location: Generalized ADL   Vision Baseline Vision/History: Wears glasses Wears Glasses: Reading only Patient Visual Report: No change from baseline Vision Assessment?: No apparent visual deficits Perception  Perception: Impaired Praxis Praxis: Impaired Praxis Impairment Details: Motor planning;Ideation Cognition Overall Cognitive Status: History of cognitive impairments - at baseline Arousal/Alertness: Awake/alert Orientation Level: Oriented to person;Oriented to place;Oriented to time Attention: Sustained Sustained Attention: Impaired Selective Attention: Impaired Memory: Impaired Awareness: Impaired Awareness Impairment: Intellectual impairment Problem Solving: Impaired Safety/Judgment: Impaired Sensation Sensation Light Touch: Appears Intact Coordination Gross Motor Movements are Fluid and Coordinated: No Fine Motor Movements are Fluid and Coordinated: No Coordination and Movement Description: global ataxia: UEs, LEs, trunk, face Motor  Motor Motor: Ataxia;Motor apraxia;Abnormal postural alignment and control Motor - Discharge Observations: little improvement noted in motor deficits, some improvement in strength Mobility  Bed Mobility Bed Mobility: Rolling Right;Right Sidelying to Sit Rolling Right:  Supervision/verbal cueing Right Sidelying to Sit: Moderate Assistance - Patient 50-74%  Transfers Sit to Stand: Maximal Assistance - Patient 25-49% Stand to Sit: Minimal Assistance - Patient > 75%  Trunk/Postural Assessment  Cervical Assessment Cervical Assessment: Exceptions to [facility] Thoracic Assessment Thoracic Assessment: Exceptions to WFL(rounded shoulders) Lumbar Assessment Lumbar Assessment: Exceptions to WFL(posterior pelvic tilt) Postural Control Postural Control: Deficits on evaluation Trunk Control: severe truncal ataxia Protective Responses: delayed, insufficient Postural Limitations: decreased limits of stability  Balance Balance Balance Assessed: Yes Static Sitting Balance Static Sitting - Balance Support: Right upper extremity supported;Left upper extremity supported;Feet supported Static Sitting - Level of Assistance: 5: Stand by assistance Dynamic Sitting Balance Dynamic Sitting - Balance Support: During functional activity;Feet supported;Left upper extremity supported;Right upper extremity supported Dynamic Sitting - Level of Assistance: 4: Min assist Extremity/Trunk Assessment RUE Assessment RUE Assessment: Within Functional Limits General Strength Comments: ataxic movements LUE Assessment LUE Assessment: Within Functional Limits General Strength Comments: ataxic movements   Gypsy Decant 04/03/2018, 5:06 PM

## 2018-04-03 NOTE — Progress Notes (Signed)
Social Work Patient ID: Wayne Buckley, male   DOB: 01/17/1951, 67 y.o.   MRN: [id number]   Have reviewed team conference with pt and wife.  Both aware little progress this week and that I am in discussion with local SNFs for placement and hope to have bed offer this week.  Wayne Vallance, LCSW

## 2018-04-03 NOTE — Discharge Summary (Signed)
Discharge summary job [id number]

## 2018-04-03 NOTE — Discharge Summary (Signed)
NAME: AMARE, BAIL MEDICAL RECORD ZO:[id number] ACCOUNT [id number] DATE OF BIRTH:1951-05-26 FACILITY: [facility] LOCATION: [facility] PHYSICIAN:ZACHARY Riley Kill, MD  DISCHARGE SUMMARY  DATE OF DISCHARGE:  04/04/2018  DISCHARGE DIAGNOSES: 1.  Decreased functional ability secondary to C6-C7 cord compression, status post anterior cervical diskectomy and fusion as well as history of traumatic brain injury with subdural hematoma in 2013. 2.  Extensive left lower extremity deep venous thrombosis, status post inferior vena cava filter.  02/27/2018 as well as chronic Eliquis. 3.  Hypertension. 4.  Leukocytosis, resolved. 5.  Benign prostatic hypertrophy. 6.  Neurogenic bowel. 7.  History of alcohol use.  HOSPITAL COURSE:  This is a 67 year old right-handed male with history of alcohol use, hypertension, TBI with subdural hematoma 6 years ago, craniotomy.  Received inpatient rehabilitation services.  He lives with spouse, reportedly independent.  Had  refused assistive devices.  He was driving short distances.  Presented 02/25/2018 after a fall x2 with noted lower extremity weakness.  Cranial CT scan negative for acute changes.  Noted chronic encephalomalacia, remote left frontal craniotomy site.   Noted vascular studies of lower extremities, positive for acute DVT left common femoral vein, femoral vein, profunda vein, popliteal vein, peroneal vein, posterior tibial veins.  Left external iliac vein also thrombosed.  Underwent placement of IVC  filter 02/27/2018 per Dr. Myra Gianotti.  X-rays and imaging revealed severe cord compression C6-C7.  Underwent an ACDF and fusion 02/28/2018 per Dr. Wynetta Emery.  HOSPITAL COURSE:  Pain management.  Decadron protocol.  Intravenous heparin initiated for extensive DVT.  Transitioned to Eliquis.  Therapy evaluations completed, the patient was admitted for comprehensive rehabilitation program.  PAST MEDICAL HISTORY:  See discharge diagnoses.  SOCIAL HISTORY:  Lives with his wife.  He  was driving short distances prior to admission, refusing assistive devices.  FUNCTIONAL STATUS:  Upon admission to rehab services was +2 physical assist supine to sit +2 physical assist sit to supine.  Total assist lower body max assist upper body ADLs.  PHYSICAL EXAMINATION: VITAL SIGNS:  Blood pressure 134/85, pulse 82, temperature 98, respirations 17. GENERAL:  Alert male. HEENT:  EOMs intact. NECK:  Supple, nontender, no JVD. CARDIOVASCULAR:  Rate controlled. ABDOMEN:  Soft, nontender, good bowel sounds. LUNGS:  Clear to auscultation without wheeze.   EXTREMITIES:  Lower extremity 2+ edema.  He was somewhat impulsive and needed to be redirected.  REHABILITATION HOSPITAL COURSE:  The patient was admitted to inpatient rehabilitation services.  Therapies initiated on a 3-hour daily basis, consisting of physical therapy, occupational therapy, speech therapy and rehabilitation nursing.  The following  issues were addressed during patient's rehabilitation stay:  Pertaining to the patient's C6-C7 cord compression, he had undergone ACDF 02/28/2018.  He would follow up with neurosurgery.  The patient also noted history of TBI, subdural hematoma, craniotomy 2013.  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  During hospital course, noted extensive left lower extremity DVT with IVC filter placed on 03/08/2018 per Dr. Myra Gianotti.  He had been transitioned to Eliquis.  Hemoglobin remained stable.  Blood pressure is controlled, monitored with Coreg, hydralazine as  well as HCTZ.    BPH, neurogenic bladder.  He continued on Flomax.  I and O caths to keep volume between and 5, which he was maintaining.  Attempts that Urecholine with little results and discontinued.  Plan was for outpatient followup urology services as needed.   Neurogenic bowel with bowel program established.    The patient received weekly collaborative interdisciplinary team conferences to discuss estimated length of stay,  family teaching, any  barriers to his discharge.  Needed maximum encouragement to increase his distance and ability to participate with  therapies working with upper extremity strengthening and coordination.  Attempted forward trunk flexion and return upright for trunk extension with assistance.  Transition to NuStep, requires +2 for sliding board transfers.  Wheelchair with max cues for  sequencing and safety.  Requires tactile cues to maintain neutral hip rotation.  The patient frequently became frustrated with encouragement during sessions.  Activities of daily living and homemaking.  He would at that time refuse toilet transfers, but  again would participate with encouragement.  Transferred onto roll in the shower chair with steady AST and mod lifting assistance.  Due to limited physical advances wife did not feel he could provide the necessary assistance at home.  Skilled nursing  facility advised.  DISCHARGE MEDICATIONS:  Included Norvasc 10 mg p.o. daily, Eliquis 5 mg p.o. b.i.d., Dulcolax suppository daily, Coreg 12.5 mg p.o. b.i.d., hydralazine 50 mg p.o. q.8h.  HCTZ 25 mg p.o. daily, potassium chloride 20 mg p.o. b.i.d., Flomax 0.4 mg p.o.  daily, vitamin C 1000 mg p.o. b.i.d., Tylenol as needed.    DIET:  His diet was mechanical soft, thin liquids.    FOLLOWUP:  He would follow up with Dr. Faith Rogue at the outpatient rehab service office as directed; Dr. Donalee Citrin, call for appointment; Dr. Duane Lope medical management; Dr. Myra Gianotti vascular surgery as needed.  Outpatient referral to urology  services for neurogenic bladder.  AN/NUANCE D:04/03/2018 T:04/03/2018 JOB:[id number]

## 2018-04-03 NOTE — Progress Notes (Signed)
Physical Therapy Discharge Summary  Patient Details  Name: Wayne Buckley MRN: [id number] Date of Birth: October 08, 1950  Today's Date: 04/03/2018 PT Individual Time: 2353-6144 PT Individual Time Calculation (min): 57 min    Patient has met 3 of 5 long term goals due to improved activity tolerance, improved postural control and increased strength.  Patient to discharge at a wheelchair level and requires mod to total assist depending on fatigue and motivation.   Patient's care partner unavailable to provide the necessary physical assistance at discharge.  Pt to d/c to SNF for continued rehab prior to returning home with family.   Reasons goals not met:  Pt has made inconsistent and slow progress towards LTGs due to ongoing deficits with ataxia, as well as fatigue, poor frustration tolerance, and decreased motivation.   Recommendation:  Patient will benefit from ongoing skilled PT services in skilled nursing facility setting to continue to advance safe functional mobility, address ongoing impairments in postural control, balance, strength, motor control, coordination, and cognition, and minimize fall risk.  Equipment: No equipment provided  Reasons for discharge: lack of progress toward goals and discharge from hospital  Patient/family agrees with progress made and goals achieved: Yes   Skilled PT Intervention: Pt initially states pain is "pretty good" but then states it's a 7/10, then a 3/10, then a 2/10.  Session focus on d/c assessment and activity tolerance with functional mobility.  Pt currently performing mobility with mod assist at best, +2 assist at worst.  Pt requires max multimodal cueing for safety throughout session, especially when sitting on elevated stedy seat as he frequently leaned back on the seat risking posterior LOB and fall.  Pt returned to room at end of session and positioned upright in w/c with chair alarm intact, call bell in reach and needs met.   PT  Discharge Precautions/Restrictions Precautions Precautions: Fall Precaution Comments: no brace needed, pt very ataxic Pain Pain Assessment Pain Scale: 0-10 Pain Score: 2 (first states 7, then 3, then 2) Pain Location: Generalized Vision/Perception  Perception Perception: Impaired Praxis Praxis: Impaired Praxis Impairment Details: Motor planning;Ideation  Cognition Overall Cognitive Status: History of cognitive impairments - at baseline Arousal/Alertness: Awake/alert Orientation Level: Oriented to person;Oriented to place;Oriented to time(partially oriented to situation, states he had surgery on his throat and his brain) Attention: Sustained Sustained Attention: Impaired Selective Attention: Impaired Memory: Impaired Awareness: Impaired Awareness Impairment: Intellectual impairment Problem Solving: Impaired Safety/Judgment: Impaired Sensation Sensation Light Touch: Appears Intact Coordination Gross Motor Movements are Fluid and Coordinated: No Fine Motor Movements are Fluid and Coordinated: No Coordination and Movement Description: global ataxia: UEs, LEs, trunk, face Motor  Motor Motor: Ataxia;Motor apraxia;Abnormal postural alignment and control Motor - Discharge Observations: little improvement noted in motor deficits, some improvement in strength  Mobility Bed Mobility Bed Mobility: Rolling Right;Right Sidelying to Sit Rolling Right: Supervision/verbal cueing Right Sidelying to Sit: Moderate Assistance - Patient 50-74% Transfers Transfers: Lateral/Scoot Transfers Sit to Stand: Maximal Assistance - Patient 25-49% Stand to Sit: Minimal Assistance - Patient > 75% Lateral/Scoot Transfers: Maximal Assistance - Patient 25-49% Transfer (Assistive device): Other (Comment)(sliding board for lateral scoot) Transfer via Lift Equipment: Probation officer Ambulation: No Gait Gait: No Stairs / Additional Locomotion Stairs: No Programmer, systems: Yes Wheelchair Assistance: Chartered loss adjuster: Both upper extremities Wheelchair Parts Management: Needs assistance Distance: 100  Trunk/Postural Assessment  Cervical Assessment Cervical Assessment: Exceptions to WFL(necreased cervical ROM, ataxia noted) Thoracic Assessment Thoracic Assessment: Exceptions to WFL(forward rounded shoulders) Lumbar  Assessment Lumbar Assessment: Exceptions to WFL(posterior pelvic tilt, fixed) Postural Control Postural Control: Deficits on evaluation Trunk Control: severe truncal ataxia Protective Responses: delayed, insufficient Postural Limitations: decreased limits of stability  Balance Balance Balance Assessed: Yes Static Sitting Balance Static Sitting - Balance Support: Right upper extremity supported;Left upper extremity supported;Feet supported Static Sitting - Level of Assistance: 5: Stand by assistance Dynamic Sitting Balance Dynamic Sitting - Balance Support: During functional activity;Feet supported;Left upper extremity supported;Right upper extremity supported Dynamic Sitting - Level of Assistance: 4: Min assist Extremity Assessment      RLE Assessment Passive Range of Motion (PROM) Comments: WFL Active Range of Motion (AROM) Comments: limited by weakness and ataxia General Strength Comments: 2/5 hip flexion and knee flexion, 3/5 knee extension LLE Assessment Passive Range of Motion (PROM) Comments: WFL Active Range of Motion (AROM) Comments: limited 2/2 weakness and ataxia General Strength Comments: 3/5 proximal to distal    [name] 04/03/2018, 2:11 PM

## 2018-04-03 NOTE — Progress Notes (Signed)
[place] PHYSICAL MEDICINE & REHABILITATION     PROGRESS NOTE    Subjective/Complaints:  Lying in bed. Doing well  ROS: Patient denies fever, rash, sore throat, blurred vision, nausea, vomiting, diarrhea, cough, shortness of breath or chest pain, joint or back pain, headache, or mood change.   Objective:   No results found. Recent Labs    04/03/18 0537  WBC 6.5  HGB 11.1*  HCT 35.5*  PLT 619*   Recent Labs    04/03/18 0537  NA 136  K 3.7  CL 101  GLUCOSE 98  BUN 10  CREATININE 0.62  CALCIUM 9.3   CBG (last 3)  No results for input(s): GLUCAP in the last 72 hours.  Wt Readings from Last 3 Encounters:  04/03/18 101.3 kg  03/02/18 99.8 kg  08/07/12 88.2 kg     Intake/Output Summary (Last 24 hours) at 04/03/2018 0830 Last data filed at 04/03/2018 0800 Gross per 24 hour  Intake 720 ml  Output 950 ml  Net -230 ml    Vital Signs: Blood pressure 125/67, pulse 73, temperature 98 F (36.7 C), temperature source Oral, resp. rate 18, height 5\' 11"  (1.803 m), weight 101.3 kg, SpO2 95 %. Physical Exam:  Constitutional: No distress . Vital signs reviewed. HEENT: EOMI, oral membranes moist Neck: supple Cardiovascular: RRR without murmur. No JVD    Respiratory: CTA Bilaterally without wheezes or rales. Normal effort    GI: BS +, non-tender, non-distended  Musc: Left lower extremity edema 1+ no change Neurological: He isalertand oriented Dysarthric speech persistent0 harder to hear this morning Motor: B/l UE 5 proximally, 3+/5 distally, stab.  Right lower extremity 4/5 proximal distal, stab.  LLE: 4-/5 proximal to distal ataxia Skin: Skin iswarm.  Intact. Psychiatric:cooperative  Assessment/Plan: 1. 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 Physiatrist is providing close team supervision and 24 hour management of active medical  problems listed below. Physiatrist and rehab team continue to assess barriers to discharge/monitor patient progress toward functional and medical goals.  Function:  Bathing Bathing position Bathing activity did not occur: Refused Position: Wheelchair/chair at sink  Bathing parts Body parts bathed by patient: Right arm, Left arm, Chest, Abdomen(UB only) Body parts bathed by helper: Right lower leg, Left lower leg, Back  Bathing assist Assist Level: Set up, Supervision or verbal cues      Upper Body Dressing/Undressing Upper body dressing Upper body dressing/undressing activity did not occur: Refused What is the patient wearing?: Pull over shirt/dress     Pull over shirt/dress - Perfomed by patient: Thread/unthread right sleeve, Thread/unthread left sleeve, Put head through opening Pull over shirt/dress - Perfomed by helper: Pull shirt over trunk        Upper body assist Assist Level: Touching or steadying assistance(Pt > 75%)      Lower Body Dressing/Undressing Lower body dressing Lower body dressing/undressing activity did not occur: Refused What is the patient wearing?: Pants, Ted Hose, Non-skid slipper socks       Pants- Performed by helper: Thread/unthread right pants leg, Thread/unthread left pants leg, Pull pants up/down(pt A with pulling up pants 50%)   Non-skid slipper socks- Performed by helper: Don/doff right sock, Don/doff left sock               TED Hose - Performed by helper: Don/doff right TED hose, Don/doff left TED hose  Lower body assist Assist for lower body dressing: (Total  A)      Toileting Toileting Toileting activity did not occur: Refused   Toileting steps completed by helper: Adjust clothing after toileting    Toileting assist Assist level: Two helpers   Transfers Chair/bed transfer   Chair/bed transfer method: Lateral scoot Chair/bed transfer assist level: 2 helpers Chair/bed transfer assistive device: Sliding board,  Armrests Mechanical lift: Maximove   Locomotion Ambulation Ambulation activity did not occur: Safety/medical Investment banker, operational activity did not occur: Safety/medical concerns Type: Manual Max wheelchair distance: 20 Assist Level: Supervision or verbal cues  Cognition Comprehension Comprehension assist level: Understands basic 75 - 89% of the time/ requires cueing 10 - 24% of the time  Expression Expression assist level: Expresses basic 50 - 74% of the time/requires cueing 25 - 49% of the time. Needs to repeat parts of sentences.  Social Interaction Social Interaction assist level: Interacts appropriately 75 - 89% of the time - Needs redirection for appropriate language or to initiate interaction.  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 DVT Prophylaxis/Anticoagulation: Extensive left lower extremity DVT status post IVC filter 02/27/2018. No  bleeding on Eliquis  Hemoglobin 11.3 on 9/30--->10.4 10/7  -follow up hgb 10.6  10/8---up to 11.1 10/16 3. Pain Management:Tylenol as needed 4. Mood:Provide emotional support 5. Neuropsych: This patientiscapable of making decisions on hisown behalf. 6. Skin/Wound Care:Routine skin checks 7. Fluids/Electrolytes/Nutrition:  -encourage PO     -replacing potassium(3.4 10/10)-->3.7 10/16 8.Leukocytosis.  Resolved  -Urine study negative 9.Hypertension. HCTZ 25 mg daily, Coreg 12.5mg  twice daily, Norvasc 10 mg daily,      hydralazine dose reduced to 50mg  to lower  bp's   good control Vitals:   04/02/18 1930 04/03/18 0432  BP: 110/75 125/67  Pulse: 75 73  Resp: 18 18  Temp: 98 F (36.7 C) 98 F (36.7 C)  SpO2: 94% 95%   10.BPH/neurogenic bladder -Flomax 0.4 mg daily -  I/O caths to keep volume between 300-500.  (close to range)   --  Urecholine 25mg  QID without results yet----will hold for now   -pt with little insight into process   -outpt urology follow up 11.neurogenic bowel: -PM bowel program--inconsistent due to patient 12.History of alcohol use. Attempt to provide counseling   LOS (Days) 29 A FACE TO FACE EVALUATION WAS PERFORMED  Ranelle Oyster, MD 04/03/2018 8:30 AM

## 2018-04-03 NOTE — Progress Notes (Addendum)
Physical Therapy Session Note  Patient Details  Name: MILIK GILREATH MRN: [id number] Date of Birth: 10/20/1950  Today's Date: 04/03/2018   -   Week 4: STGS =LTGs    Skilled Therapeutic Interventions/Progress Updates:   Pt asleep in bed.  He declined, stating he would "rather sleep".  PT offered bedside tx for strengthening/coordination and bed mobility, but pt declined.   PT checked back in 30 min later; pt asleep and woke up enough to decline again.     Therapy Documentation Precautions:  Precautions Precautions: Fall, Cervical Precaution Comments: No brace Restrictions Weight Bearing Restrictions: No General: Missed time made up at end of day     Therapy/Group: Individual Therapy  Miranda Garber 04/03/2018, 9:49 AM

## 2018-04-03 NOTE — Patient Care Conference (Signed)
Inpatient RehabilitationTeam Conference and Plan of Care Update Date: 04/02/2018   Time: 2:00 PM    Patient Name: Wayne Buckley      Medical Record Number: [id number]  Date of Birth: 1950-10-31 Sex: Male         Room/Bed: 4W02C/4W02C-01 Payor Info: Payor: BLUE CROSS BLUE [employer]IELD / Plan: BCBS OTHER / Product Type: *No Product type* /    Admitting Diagnosis: ACDF  Admit Date/Time:  03/05/2018  2:54 PM Admission Comments: No comment available   Primary Diagnosis:  <principal problem not specified> Principal Problem: <principal problem not specified>  Patient Active Problem List   Diagnosis Date Noted  . Acute blood loss anemia   . Cord compression (HCC) 03/05/2018  . C5-C7 level spinal cord injury (HCC)   . Central cord syndrome (HCC)   . Neurogenic bladder   . Essential hypertension   . AKI (acute kidney injury) (HCC) 02/26/2018  . Bilateral leg weakness 02/26/2018  . Leg DVT (deep venous thromboembolism), acute, left (HCC) 02/26/2018  . Fall 02/25/2018  . Alcohol abuse 05/04/2012  . Hypokalemia 05/04/2012  . Urinary retention 05/04/2012  . Subdural hematoma, post-traumatic (HCC) 04/28/2012    Expected Discharge Date: Expected Discharge Date: (SNF)  Team Members Present: Physician leading conference: Dr. Faith Rogue Social Worker Present: [name], LCSW Nurse Present: Ronny Bacon, RN PT Present: Teodoro Kil, PT OT Present: Callie Fielding, OT SLP Present: Colin Benton, SLP PPS Coordinator present : Tora Duck, RN, CRRN     Current Status/Progress Goal Weekly Team Focus  Medical   Minimal changes as patient unable to find into bowel and bladder program due to his cognitive and behavioral deficits.  Stabilized medically for discharge to home  See prior   Bowel/Bladder   Bowel incontinence, unable to urinate, bladder scan q6-8hrs; LBM 04/01/18  continent of bowel and able to urinate on his own w/minimal assistance needed   assess bowel and bladder needs;  times toileting q2hrs while awake    Swallow/Nutrition/ Hydration             ADL's   Mod-Max A slideboard transfers, Min-Max A LB self care bedlevel depending on willingness to participate. Still very self limiting and requires max encouragement to participate during sessions   LB self care downgraded to Mod A, supervision/cuing for UB self care  Active therapy participation, UE/LE coordination, balance, functional transfers, endurance, strengthening, pt/family education    Mobility   regressing to mod/max assist for slide board transfers, decreasing motivation  downgraded to mod assist overall, w/c level; d/c car transfer and ambulation goals  plan for d/c to SNF   Communication             Safety/Cognition/ Behavioral Observations            Pain   denies pain   no pain   assess pain q shift; PRn and medicate as needed    Skin   blister on left great toe (healed)   remain free of infection/skin break downs   assess skin q shift; PRN     Rehab Goals Patient on target to meet rehab goals: No Rehab Goals Revised: anticipate several goals to be downgraded *See Care Plan and progress notes for long and short-term goals.     Barriers to Discharge  Current Status/Progress Possible Resolutions Date Resolved   Physician    Medical stability               Nursing  PT                    OT                  SLP                SW                Discharge Planning/Teaching Needs:  Plan changed to SNF  Wife remains very involved since last conference and recommendation that she be present for tx sessions when she is able.   Team Discussion:  Very little change this week in overall function.  No new medical issues.  SW pursuing SNF. Therapies concerned that he has regressed in some areas.  Revisions to Treatment Plan:  NA    Continued Need for Acute Rehabilitation Level of Care: The patient requires daily medical management by a physician with specialized  training in physical medicine and rehabilitation for the following conditions: Daily direction of a multidisciplinary physical rehabilitation program to ensure safe treatment while eliciting the highest outcome that is of practical value to the patient.: Yes Daily medical management of patient stability for increased activity during participation in an intensive rehabilitation regime.: Yes Daily analysis of laboratory values and/or radiology reports with any subsequent need for medication adjustment of medical intervention for : Neurological problems   I attest that I was present, lead the team conference, and concur with the assessment and plan of the team.   Quisha Mabie 04/03/2018, 9:12 AM

## 2018-04-04 ENCOUNTER — Inpatient Hospital Stay (HOSPITAL_COMMUNITY): Payer: Medicare Other | Admitting: Physical Therapy

## 2018-04-04 NOTE — Progress Notes (Signed)
Pt transported to SNF via EMS. Accompanied by family. Pt is stable at time of discharge. PRN tylenol given for comfort.  Wayne Buckley

## 2018-04-04 NOTE — Progress Notes (Signed)
Report given to [name] at [facility].  Wayne Buckley [name]

## 2018-04-04 NOTE — Plan of Care (Signed)
  Problem: Consults Goal: RH SPINAL CORD INJURY PATIENT EDUCATION Description  See Patient Education module for education specifics.  Outcome: Progressing Goal: Skin Care Protocol Initiated - if Braden Score 18 or less Description If consults are not indicated, leave blank or document N/A Outcome: Progressing Goal: Nutrition Consult-if indicated Outcome: Progressing   Problem: SCI BOWEL ELIMINATION Goal: RH STG MANAGE BOWEL WITH ASSISTANCE Description STG Manage Bowel with min Assistance.  Outcome: Progressing   Problem: SCI BLADDER ELIMINATION Goal: RH STG MANAGE BLADDER WITH ASSISTANCE Description STG Manage Bladder With total Assistance  Outcome: Progressing Goal: RH STG MANAGE BLADDER WITH MEDICATION WITH ASSISTANCE Description STG Manage Bladder With Medication With min  Assistance.  Outcome: Progressing Goal: RH STG MANAGE BLADDER WITH EQUIPMENT WITH ASSISTANCE Description STG Manage Bladder With Equipment With mod Assistance  Outcome: Progressing Goal: RH STG SCI MANAGE BLADDER PROGRAM W/ASSISTANCE Description Patient and wife will verbalize management of bladder program prior to discharge.    Outcome: Progressing   Problem: RH SKIN INTEGRITY Goal: RH STG SKIN FREE OF INFECTION/BREAKDOWN Description Patient and wife will verbalize understanding of management of skin breakdown  Outcome: Progressing Goal: RH STG MAINTAIN SKIN INTEGRITY WITH ASSISTANCE Description STG Maintain Skin Integrity With min  Assistance.  Outcome: Progressing   Problem: RH SAFETY Goal: RH STG ADHERE TO SAFETY PRECAUTIONS W/ASSISTANCE/DEVICE Description STG Adhere to Safety Precautions With min Assistance/Device.  Outcome: Progressing Goal: RH STG DECREASED RISK OF FALL WITH ASSISTANCE Description STG Decreased Risk of Fall With min Assistance.  Outcome: Progressing   Problem: RH PAIN MANAGEMENT Goal: RH STG PAIN MANAGED AT OR BELOW PT'S PAIN GOAL Description Less than 2   Outcome: Progressing   Problem: RH KNOWLEDGE DEFICIT SCI Goal: RH STG INCREASE KNOWLEDGE OF SELF CARE AFTER SCI Description Patient and wife will increase awareness of how to manage self care after SCI  Outcome: Progressing

## 2018-04-04 NOTE — Progress Notes (Signed)
[facility]     PROGRESS NOTE    Subjective/Complaints:  No new complaints.   ROS: Patient denies fever, rash, sore throat, blurred vision, nausea, vomiting, diarrhea, cough, shortness of breath or chest pain, joint or back pain, headache, or mood change.    Objective:   No results found. Recent Labs    04/03/18 0537  WBC 6.5  HGB 11.1*  HCT 35.5*  PLT 619*   Recent Labs    04/03/18 0537  NA 136  K 3.7  CL 101  GLUCOSE 98  BUN 10  CREATININE 0.62  CALCIUM 9.3   CBG (last 3)  No results for input(s): GLUCAP in the last 72 hours.  Wt Readings from Last 3 Encounters:  04/03/18 101.3 kg  03/02/18 99.8 kg  08/07/12 88.2 kg     Intake/Output Summary (Last 24 hours) at 04/04/2018 0854 Last data filed at 04/04/2018 0744 Gross per 24 hour  Intake 600 ml  Output 1300 ml  Net -700 ml    Vital Signs: Blood pressure 126/70, pulse 74, temperature 97.6 F (36.4 C), temperature source Oral, resp. rate 18, height 5\' 11"  (1.803 m), weight 101.3 kg, SpO2 93 %. Physical Exam:  Constitutional: No distress . Vital signs reviewed. HEENT: EOMI, oral membranes moist Neck: supple Cardiovascular: RRR without murmur. No JVD    Respiratory: CTA Bilaterally without wheezes or rales. Normal effort    GI: BS +, non-tender, non-distended  Musc: Left lower extremity edema 1+ no change Neurological: He isalertand oriented Dysarthric speech persistent0 harder to hear this morning Motor: B/l UE 5 proximally, 3+/5 distally, stab.  Right lower extremity 4/5 proximal distal, stab.  LLE: 4-/5 proximal to distal ataxia Skin: Skin iswarm.  Intact. Psychiatric:cooperative  Assessment/Plan: 1. 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 Physiatrist is providing close team supervision and 24 hour management of active medical  problems listed below. Physiatrist and rehab team continue to assess barriers to discharge/monitor patient progress toward functional and medical goals.  Function:  Bathing Bathing position Bathing activity did not occur: Refused Position: Wheelchair/chair at sink  Bathing parts Body parts bathed by patient: Right arm, Left arm, Chest, Abdomen(UB only) Body parts bathed by helper: Right lower leg, Left lower leg, Back  Bathing assist Assist Level: Set up, Supervision or verbal cues      Upper Body Dressing/Undressing Upper body dressing Upper body dressing/undressing activity did not occur: Refused What is the patient wearing?: Pull over shirt/dress     Pull over shirt/dress - Perfomed by patient: Thread/unthread right sleeve, Thread/unthread left sleeve, Put head through opening Pull over shirt/dress - Perfomed by helper: Pull shirt over trunk        Upper body assist Assist Level: Touching or steadying assistance(Pt > 75%)      Lower Body Dressing/Undressing Lower body dressing Lower body dressing/undressing activity did not occur: Refused What is the patient wearing?: Pants, Ted Hose, Non-skid slipper socks       Pants- Performed by helper: Thread/unthread right pants leg, Thread/unthread left pants leg, Pull pants up/down(pt A with pulling up pants 50%)   Non-skid slipper socks- Performed by helper: Don/doff right sock, Don/doff left sock               TED Hose - Performed by helper: Don/doff right TED hose, Don/doff left TED hose  Lower body assist Assist for lower body dressing: (Total  A)      Toileting Toileting Toileting activity did not occur: Refused   Toileting steps completed by helper: Adjust clothing after toileting    Toileting assist Assist level: Two helpers   Transfers Chair/bed transfer   Chair/bed transfer method: Lateral scoot Chair/bed transfer assist level: 2 helpers Chair/bed transfer assistive device: Sliding board,  Armrests Mechanical lift: Maximove   Locomotion Ambulation Ambulation activity did not occur: Safety/medical Investment banker, operational activity did not occur: Safety/medical concerns Type: Manual Max wheelchair distance: 20 Assist Level: Supervision or verbal cues  Cognition Comprehension Comprehension assist level: Understands basic 75 - 89% of the time/ requires cueing 10 - 24% of the time  Expression Expression assist level: Expresses basic 50 - 74% of the time/requires cueing 25 - 49% of the time. Needs to repeat parts of sentences.  Social Interaction Social Interaction assist level: Interacts appropriately 75 - 89% of the time - Needs redirection for appropriate language or to initiate interaction.  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 DVT Prophylaxis/Anticoagulation: Extensive left lower extremity DVT status post IVC filter 02/27/2018. No  bleeding on Eliquis  Hemoglobin 11.3 on 9/30--->10.4 10/7  -follow up hgb 10.6  10/8---up to 11.1 10/16 3. Pain Management:Tylenol as needed 4. Mood:Provide emotional support 5. Neuropsych: This patientiscapable of making decisions on hisown behalf. 6. Skin/Wound Care:Routine skin checks 7. Fluids/Electrolytes/Nutrition:  -encourage PO     -replacing potassium(3.4 10/10)-->3.7 10/16 8.Leukocytosis.  Resolved  -Urine study negative 9.Hypertension. HCTZ 25 mg daily, Coreg 12.5mg  twice daily, Norvasc 10 mg daily,      hydralazine dose reduced to 50mg  to lower bp's   good  control Vitals:   04/03/18 1907 04/04/18 0424  BP: 93/69 126/70  Pulse: 85 74  Resp: 16 18  Temp: 98 F (36.7 C) 97.6 F (36.4 C)  SpO2: 92% 93%   10.BPH/neurogenic bladder -Flomax 0.4 mg daily -  I/O caths to keep volume between 300-500.  (close to range)   --  Urecholine 25mg  QID without results yet----will hold for now   -pt with little insight into process   -outpt urology follow up referral made 11.neurogenic bowel: -PM bowel program--inconsistent due to patient 12.History of alcohol use. Attempt to provide counseling   LOS (Days) 30 A FACE TO FACE EVALUATION WAS PERFORMED  Ranelle Oyster, MD 04/04/2018 8:54 AM

## 2018-04-05 NOTE — Progress Notes (Signed)
Social Work  Discharge Note  The overall goal for the admission was met for:   Discharge location: No - plan changed to SNF as wife unable to meet care needs.  Length of Stay: No - plan changed to SNF - LOS = 30 days  Discharge activity level: yes - supervision to max assist overall  Home/community participation: No  Services provided included: MD, RD, PT, OT, SLP, RN, TR, Pharmacy, Neuropsych and SW  Financial Services: Medicare and Private Insurance: Generic insurance  Follow-up services arranged: Other: SNF at [facility]  Comments (or additional information):  Patient/Family verbalized understanding of follow-up arrangements: Yes  Individual responsible for coordination of the follow-up plan: pt/spouse  Confirmed correct DME delivered: NA    Duru Reiger

## 2018-05-01 ENCOUNTER — Encounter: Payer: BLUE CROSS/BLUE SHIELD | Admitting: Physical Medicine & Rehabilitation

## 2018-05-02 ENCOUNTER — Emergency Department (HOSPITAL_COMMUNITY): Payer: Medicare Other

## 2018-05-02 ENCOUNTER — Inpatient Hospital Stay (HOSPITAL_COMMUNITY)
Admission: EM | Admit: 2018-05-02 | Discharge: 2018-05-31 | DRG: 698 | Disposition: A | Discharge status: Skilled Nursing Facility | Payer: Medicare Other | Attending: Internal Medicine | Admitting: Internal Medicine

## 2018-05-02 ENCOUNTER — Encounter (HOSPITAL_COMMUNITY): Payer: Self-pay | Admitting: Emergency Medicine

## 2018-05-02 ENCOUNTER — Other Ambulatory Visit: Payer: Self-pay

## 2018-05-02 DIAGNOSIS — X58XXXD Exposure to other specified factors, subsequent encounter: Secondary | ICD-10-CM | POA: Diagnosis present

## 2018-05-02 DIAGNOSIS — Z87820 Personal history of traumatic brain injury: Secondary | ICD-10-CM

## 2018-05-02 DIAGNOSIS — K5981 Ogilvie syndrome: Secondary | ICD-10-CM | POA: Insufficient documentation

## 2018-05-02 DIAGNOSIS — R339 Retention of urine, unspecified: Secondary | ICD-10-CM | POA: Diagnosis present

## 2018-05-02 DIAGNOSIS — K598 Other specified functional intestinal disorders: Secondary | ICD-10-CM

## 2018-05-02 DIAGNOSIS — K56699 Other intestinal obstruction unspecified as to partial versus complete obstruction: Secondary | ICD-10-CM | POA: Diagnosis present

## 2018-05-02 DIAGNOSIS — L89151 Pressure ulcer of sacral region, stage 1: Secondary | ICD-10-CM | POA: Diagnosis present

## 2018-05-02 DIAGNOSIS — R402144 Coma scale, eyes open, spontaneous, 24 hours or more after hospital admission: Secondary | ICD-10-CM | POA: Diagnosis not present

## 2018-05-02 DIAGNOSIS — N179 Acute kidney failure, unspecified: Secondary | ICD-10-CM | POA: Diagnosis present

## 2018-05-02 DIAGNOSIS — R402364 Coma scale, best motor response, obeys commands, 24 hours or more after hospital admission: Secondary | ICD-10-CM | POA: Diagnosis not present

## 2018-05-02 DIAGNOSIS — I339 Acute and subacute endocarditis, unspecified: Secondary | ICD-10-CM | POA: Diagnosis not present

## 2018-05-02 DIAGNOSIS — S069X9A Unspecified intracranial injury with loss of consciousness of unspecified duration, initial encounter: Secondary | ICD-10-CM

## 2018-05-02 DIAGNOSIS — K567 Ileus, unspecified: Secondary | ICD-10-CM

## 2018-05-02 DIAGNOSIS — S14129A Central cord syndrome at unspecified level of cervical spinal cord, initial encounter: Secondary | ICD-10-CM | POA: Diagnosis present

## 2018-05-02 DIAGNOSIS — W050XXA Fall from non-moving wheelchair, initial encounter: Secondary | ICD-10-CM | POA: Diagnosis present

## 2018-05-02 DIAGNOSIS — I10 Essential (primary) hypertension: Secondary | ICD-10-CM | POA: Diagnosis not present

## 2018-05-02 DIAGNOSIS — E46 Unspecified protein-calorie malnutrition: Secondary | ICD-10-CM | POA: Diagnosis present

## 2018-05-02 DIAGNOSIS — N184 Chronic kidney disease, stage 4 (severe): Secondary | ICD-10-CM | POA: Diagnosis present

## 2018-05-02 DIAGNOSIS — Y846 Urinary catheterization as the cause of abnormal reaction of the patient, or of later complication, without mention of misadventure at the time of the procedure: Secondary | ICD-10-CM | POA: Diagnosis present

## 2018-05-02 DIAGNOSIS — E876 Hypokalemia: Secondary | ICD-10-CM | POA: Diagnosis present

## 2018-05-02 DIAGNOSIS — D631 Anemia in chronic kidney disease: Secondary | ICD-10-CM | POA: Diagnosis present

## 2018-05-02 DIAGNOSIS — E871 Hypo-osmolality and hyponatremia: Secondary | ICD-10-CM | POA: Diagnosis not present

## 2018-05-02 DIAGNOSIS — Z981 Arthrodesis status: Secondary | ICD-10-CM

## 2018-05-02 DIAGNOSIS — T83518A Infection and inflammatory reaction due to other urinary catheter, initial encounter: Secondary | ICD-10-CM | POA: Diagnosis present

## 2018-05-02 DIAGNOSIS — Z86718 Personal history of other venous thrombosis and embolism: Secondary | ICD-10-CM

## 2018-05-02 DIAGNOSIS — Y92129 Unspecified place in nursing home as the place of occurrence of the external cause: Secondary | ICD-10-CM

## 2018-05-02 DIAGNOSIS — N319 Neuromuscular dysfunction of bladder, unspecified: Secondary | ICD-10-CM | POA: Diagnosis present

## 2018-05-02 DIAGNOSIS — F1010 Alcohol abuse, uncomplicated: Secondary | ICD-10-CM | POA: Diagnosis not present

## 2018-05-02 DIAGNOSIS — S14129S Central cord syndrome at unspecified level of cervical spinal cord, sequela: Secondary | ICD-10-CM | POA: Diagnosis not present

## 2018-05-02 DIAGNOSIS — R402254 Coma scale, best verbal response, oriented, 24 hours or more after hospital admission: Secondary | ICD-10-CM | POA: Diagnosis not present

## 2018-05-02 DIAGNOSIS — R5381 Other malaise: Secondary | ICD-10-CM | POA: Diagnosis present

## 2018-05-02 DIAGNOSIS — R6520 Severe sepsis without septic shock: Secondary | ICD-10-CM | POA: Diagnosis not present

## 2018-05-02 DIAGNOSIS — R531 Weakness: Secondary | ICD-10-CM | POA: Diagnosis present

## 2018-05-02 DIAGNOSIS — A419 Sepsis, unspecified organism: Secondary | ICD-10-CM

## 2018-05-02 DIAGNOSIS — R140 Abdominal distension (gaseous): Secondary | ICD-10-CM | POA: Diagnosis not present

## 2018-05-02 DIAGNOSIS — N390 Urinary tract infection, site not specified: Secondary | ICD-10-CM

## 2018-05-02 DIAGNOSIS — G9520 Unspecified cord compression: Secondary | ICD-10-CM | POA: Diagnosis not present

## 2018-05-02 DIAGNOSIS — I129 Hypertensive chronic kidney disease with stage 1 through stage 4 chronic kidney disease, or unspecified chronic kidney disease: Secondary | ICD-10-CM | POA: Diagnosis present

## 2018-05-02 DIAGNOSIS — Z7901 Long term (current) use of anticoagulants: Secondary | ICD-10-CM

## 2018-05-02 DIAGNOSIS — R1310 Dysphagia, unspecified: Secondary | ICD-10-CM

## 2018-05-02 DIAGNOSIS — R7881 Bacteremia: Secondary | ICD-10-CM | POA: Diagnosis not present

## 2018-05-02 DIAGNOSIS — I38 Endocarditis, valve unspecified: Secondary | ICD-10-CM | POA: Diagnosis not present

## 2018-05-02 DIAGNOSIS — L98429 Non-pressure chronic ulcer of back with unspecified severity: Secondary | ICD-10-CM

## 2018-05-02 DIAGNOSIS — Z960 Presence of urogenital implants: Secondary | ICD-10-CM | POA: Diagnosis not present

## 2018-05-02 DIAGNOSIS — R296 Repeated falls: Secondary | ICD-10-CM | POA: Diagnosis present

## 2018-05-02 DIAGNOSIS — S069XAA Unspecified intracranial injury with loss of consciousness status unknown, initial encounter: Secondary | ICD-10-CM

## 2018-05-02 DIAGNOSIS — B9562 Methicillin resistant Staphylococcus aureus infection as the cause of diseases classified elsewhere: Secondary | ICD-10-CM | POA: Diagnosis not present

## 2018-05-02 DIAGNOSIS — Z789 Other specified health status: Secondary | ICD-10-CM

## 2018-05-02 DIAGNOSIS — J189 Pneumonia, unspecified organism: Secondary | ICD-10-CM | POA: Diagnosis present

## 2018-05-02 DIAGNOSIS — S14129D Central cord syndrome at unspecified level of cervical spinal cord, subsequent encounter: Secondary | ICD-10-CM

## 2018-05-02 DIAGNOSIS — E860 Dehydration: Secondary | ICD-10-CM | POA: Diagnosis present

## 2018-05-02 DIAGNOSIS — A4102 Sepsis due to Methicillin resistant Staphylococcus aureus: Secondary | ICD-10-CM | POA: Diagnosis present

## 2018-05-02 DIAGNOSIS — L89896 Pressure-induced deep tissue damage of other site: Secondary | ICD-10-CM | POA: Diagnosis present

## 2018-05-02 DIAGNOSIS — Z538 Procedure and treatment not carried out for other reasons: Secondary | ICD-10-CM | POA: Diagnosis not present

## 2018-05-02 DIAGNOSIS — X58XXXS Exposure to other specified factors, sequela: Secondary | ICD-10-CM | POA: Diagnosis not present

## 2018-05-02 DIAGNOSIS — D649 Anemia, unspecified: Secondary | ICD-10-CM

## 2018-05-02 DIAGNOSIS — Z79899 Other long term (current) drug therapy: Secondary | ICD-10-CM

## 2018-05-02 DIAGNOSIS — Z95828 Presence of other vascular implants and grafts: Secondary | ICD-10-CM

## 2018-05-02 DIAGNOSIS — Z6828 Body mass index (BMI) 28.0-28.9, adult: Secondary | ICD-10-CM

## 2018-05-02 HISTORY — DX: Acute embolism and thrombosis of unspecified deep veins of unspecified lower extremity: I82.409

## 2018-05-02 HISTORY — DX: Neurogenic bowel, not elsewhere classified: K59.2

## 2018-05-02 HISTORY — DX: Unspecified injury at unspecified level of cervical spinal cord, initial encounter: S14.109A

## 2018-05-02 HISTORY — DX: Alcohol abuse, in remission: F10.11

## 2018-05-02 HISTORY — DX: Benign prostatic hyperplasia without lower urinary tract symptoms: N40.0

## 2018-05-02 LAB — COMPREHENSIVE METABOLIC PANEL
ALT: 36 U/L (ref 0–44)
AST: 83 U/L — ABNORMAL HIGH (ref 15–41)
Albumin: 2.3 g/dL — ABNORMAL LOW (ref 3.5–5.0)
Alkaline Phosphatase: 67 U/L (ref 38–126)
Anion gap: 10 (ref 5–15)
BUN: 82 mg/dL — ABNORMAL HIGH (ref 8–23)
CO2: 19 mmol/L — ABNORMAL LOW (ref 22–32)
CREATININE: 1.67 mg/dL — AB (ref 0.61–1.24)
Calcium: 8.2 mg/dL — ABNORMAL LOW (ref 8.9–10.3)
Chloride: 102 mmol/L (ref 98–111)
GFR, EST AFRICAN AMERICAN: 47 mL/min — AB (ref >60)
GFR, EST NON AFRICAN AMERICAN: 40 mL/min — AB (ref >60)
Glucose, Bld: 119 mg/dL — ABNORMAL HIGH (ref 70–99)
POTASSIUM: 3.4 mmol/L — AB (ref 3.5–5.1)
Sodium: 131 mmol/L — ABNORMAL LOW (ref 135–145)
TOTAL PROTEIN: 6.1 g/dL — AB (ref 6.5–8.1)
Total Bilirubin: 0.5 mg/dL (ref 0.3–1.2)

## 2018-05-02 LAB — CBC WITH DIFFERENTIAL/PLATELET
Abs Immature Granulocytes: 0.1 10*3/uL — ABNORMAL HIGH (ref 0.00–0.07)
BASOS ABS: 0 10*3/uL (ref 0.0–0.1)
BASOS PCT: 0 %
EOS ABS: 0 10*3/uL (ref 0.0–0.5)
EOS PCT: 0 %
HCT: 30.2 % — ABNORMAL LOW (ref 39.0–52.0)
Hemoglobin: 9.7 g/dL — ABNORMAL LOW (ref 13.0–17.0)
Immature Granulocytes: 1 %
LYMPHS PCT: 8 %
Lymphs Abs: 0.7 10*3/uL (ref 0.7–4.0)
MCH: 28.4 pg (ref 26.0–34.0)
MCHC: 32.1 g/dL (ref 30.0–36.0)
MCV: 88.6 fL (ref 80.0–100.0)
Monocytes Absolute: 0.5 10*3/uL (ref 0.1–1.0)
Monocytes Relative: 7 %
NRBC: 0 % (ref 0.0–0.2)
Neutro Abs: 6.5 10*3/uL (ref 1.7–7.7)
Neutrophils Relative %: 84 %
PLATELETS: 253 10*3/uL (ref 150–400)
RBC: 3.41 MIL/uL — AB (ref 4.22–5.81)
RDW: 14.7 % (ref 11.5–15.5)
WBC: 7.8 10*3/uL (ref 4.0–10.5)

## 2018-05-02 LAB — I-STAT CG4 LACTIC ACID, ED
LACTIC ACID, VENOUS: 1.13 mmol/L (ref 0.5–1.9)
LACTIC ACID, VENOUS: 1.66 mmol/L (ref 0.5–1.9)

## 2018-05-02 LAB — URINALYSIS, MICROSCOPIC (REFLEX)

## 2018-05-02 LAB — URINALYSIS, ROUTINE W REFLEX MICROSCOPIC
Bilirubin Urine: NEGATIVE
Glucose, UA: NEGATIVE mg/dL
Nitrite: NEGATIVE
PROTEIN: 30 mg/dL — AB
Specific Gravity, Urine: >1.03 — ABNORMAL HIGH (ref 1.005–1.030)
pH: 5 (ref 5.0–8.0)

## 2018-05-02 MED ORDER — ADULT MULTIVITAMIN W/MINERALS CH: 1.0000 | ORAL_TABLET | Freq: Every day | ORAL | Status: DC

## 2018-05-02 MED ORDER — VANCOMYCIN HCL IN DEXTROSE 1-5 GM/200ML-% IV SOLN
1000.0000 mg | INTRAVENOUS | Status: AC
  Administered 2018-05-02: 1000 mg via INTRAVENOUS
  Filled 2018-05-02: qty 200

## 2018-05-02 MED ORDER — LORAZEPAM 2 MG/ML IJ SOLN: 1.0000 mg | Freq: Four times a day (QID) | INTRAMUSCULAR | Status: DC | PRN

## 2018-05-02 MED ORDER — LORAZEPAM 1 MG PO TABS: 1.0000 mg | ORAL_TABLET | Freq: Four times a day (QID) | ORAL | Status: DC | PRN

## 2018-05-02 MED ORDER — FOLIC ACID 1 MG PO TABS: 1.0000 mg | ORAL_TABLET | Freq: Every day | ORAL | Status: DC

## 2018-05-02 MED ORDER — VANCOMYCIN HCL IN DEXTROSE 750-5 MG/150ML-% IV SOLN
750.0000 mg | Freq: Two times a day (BID) | INTRAVENOUS | Status: DC
  Administered 2018-05-03 – 2018-05-09 (×13): 750 mg via INTRAVENOUS
  Filled 2018-05-02 (×16): qty 150

## 2018-05-02 MED ORDER — LACTATED RINGERS IV BOLUS
1000.0000 mL | Freq: Once | INTRAVENOUS | Status: AC
  Administered 2018-05-02: 1000 mL via INTRAVENOUS

## 2018-05-02 MED ORDER — SODIUM CHLORIDE 0.9 % IV SOLN
2.0000 g | INTRAVENOUS | Status: DC
  Filled 2018-05-02: qty 2

## 2018-05-02 MED ORDER — VANCOMYCIN HCL IN DEXTROSE 1-5 GM/200ML-% IV SOLN
1000.0000 mg | Freq: Once | INTRAVENOUS | Status: AC
  Administered 2018-05-02: 1000 mg via INTRAVENOUS
  Filled 2018-05-02: qty 200

## 2018-05-02 MED ORDER — SODIUM CHLORIDE 0.9 % IV SOLN
INTRAVENOUS | Status: DC
  Administered 2018-05-03: 01:00:00 via INTRAVENOUS

## 2018-05-02 MED ORDER — SODIUM CHLORIDE 0.9 % IV SOLN
2.0000 g | Freq: Once | INTRAVENOUS | Status: AC
  Administered 2018-05-02: 2 g via INTRAVENOUS
  Filled 2018-05-02: qty 2

## 2018-05-02 MED ORDER — THIAMINE HCL 100 MG/ML IJ SOLN: 100.0000 mg | Freq: Every day | INTRAMUSCULAR | Status: DC

## 2018-05-02 MED ORDER — LACTATED RINGERS IV BOLUS
500.0000 mL | Freq: Once | INTRAVENOUS | Status: AC
  Administered 2018-05-02: 500 mL via INTRAVENOUS

## 2018-05-02 MED ORDER — VITAMIN B-1 100 MG PO TABS: 100.0000 mg | ORAL_TABLET | Freq: Every day | ORAL | Status: DC

## 2018-05-02 NOTE — Progress Notes (Signed)
Pharmacy Antibiotic Note  Wayne Buckley is a 67 y.o. male admitted on 05/02/2018 with pneumonia/sepsis.  Pharmacy has been consulted for vancomycin/cefepime dosing. WBC wnl, LA 1.13. SCr 167 on admit (baseline ~0.4-0.7 recently), CrCl~45-50.  Patient has received vancomycin 1g IV x 1 and cefepime 2g IV x 1 infusing in the ER.  Plan: Cefepime 2g IV x 1; then 2g IV q24h Vancomycin 2g total load IV x1; then 750mg  IV q12h Monitor clinical progress, c/s, renal function F/u de-escalation plan/LOT, vancomycin trough as indicated   Weight: 200 lb (90.7 kg)  No data recorded.  Recent Labs  Lab 05/02/18 1402 05/02/18 1412  WBC 7.8  --   LATICACIDVEN  --  1.13    CrCl cannot be calculated (Patient's most recent lab result is older than the maximum 21 days allowed.).    No Known Allergies  Antimicrobials this admission: 11/14 vancomycin >>  11/14 cefepime >>   Dose adjustments this admission:   Microbiology results:   Babs BertinHaley Carlitos Bottino, PharmD, BCPS Clinical Pharmacist 05/02/2018 2:44 PM

## 2018-05-02 NOTE — ED Notes (Signed)
Attempted report x1. 

## 2018-05-02 NOTE — ED Provider Notes (Signed)
[facility] EMERGENCY DEPARTMENT Provider Note   CSN: [id number] Arrival date & time: 05/02/18  1329     History   Chief Complaint Chief Complaint  Patient presents with  . Fever    HPI Wayne Buckley is a 67 y.o. male.  The history is provided by the patient.  Fever   This is a new problem. The current episode started more than 2 days ago. The problem occurs daily. The maximum temperature noted was 103 to 104 F. Pertinent negatives include no chest pain, no fussiness, no sleepiness, no diarrhea, no vomiting, no congestion, no headaches, no sore throat, no tugging at ear, no muscle aches and no cough. Associated symptoms comments: Being treated for pneumonia with IM rocephin for the past 3 days and on macrobid for UTI.Marland Kitchen He has tried acetaminophen for the symptoms. The treatment provided mild relief.    Past Medical History:  Diagnosis Date  . Hypertension   . TBI (traumatic brain injury) [facility]) 2013    Patient Active Problem List   Diagnosis Date Noted  . Acute blood loss anemia   . Cord compression (HCC) 03/05/2018  . C5-C7 level spinal cord injury (HCC)   . Central cord syndrome (HCC)   . Neurogenic bladder   . Essential hypertension   . AKI (acute kidney injury) (HCC) 02/26/2018  . Bilateral leg weakness 02/26/2018  . Leg DVT (deep venous thromboembolism), acute, left (HCC) 02/26/2018  . Fall 02/25/2018  . Alcohol abuse 05/04/2012  . Hypokalemia 05/04/2012  . Urinary retention 05/04/2012  . Subdural hematoma, post-traumatic (HCC) 04/28/2012    Past Surgical History:  Procedure Laterality Date  . ANTERIOR CERVICAL DECOMP/DISCECTOMY FUSION N/A 02/27/2018   Procedure: ANTERIOR CERVICAL DECOMPRESSION/DISCECTOMY FUSION C6-7;  Surgeon: Donalee Citrin, MD;  Location: [facility] OR;  Service: Neurosurgery;  Laterality: N/A;  . CRANIOTOMY  04/28/2012   Procedure: CRANIOTOMY HEMATOMA EVACUATION SUBDURAL;  Surgeon: Reinaldo Meeker, MD;  Location: [facility] OR;  Service:  Neurosurgery;  Laterality: Left;  Left Craniotomy for Subdural Hematoma  . RADIOLOGY WITH ANESTHESIA N/A 02/27/2018   Procedure: MRI WITH ANESTHESIA;  Surgeon: Radiologist, Medication, MD;  Location: [facility] OR;  Service: Radiology;  Laterality: N/A;  . VENA CAVA FILTER PLACEMENT Right 02/27/2018   Procedure: INSERTION VENA-CAVA FILTER;  Surgeon: Nada Libman, MD;  Location: [facility] OR;  Service: Vascular;  Laterality: Right;        Home Medications    Prior to Admission medications   Medication Sig Start Date End Date Taking? Authorizing Provider  acetaminophen (TYLENOL) 325 MG tablet Take 2 tablets (650 mg total) by mouth every 6 (six) hours as needed for mild pain. 04/03/18  Yes Angiulli, Mcarthur Rossetti, PA-C  acetaminophen (TYLENOL) 500 MG tablet Take 1,000 mg by mouth every 6 (six) hours as needed for mild pain.   Yes [provider]  amLODipine (NORVASC) 10 MG tablet Take 1 tablet (10 mg total) by mouth daily. 03/06/18  Yes Leroy Sea, MD  apixaban (ELIQUIS) 5 MG TABS tablet Take 1 tablet (5 mg total) by mouth 2 (two) times daily. 04/03/18  Yes Angiulli, Mcarthur Rossetti, PA-C  bisacodyl (DULCOLAX) 10 MG suppository Place 1 suppository (10 mg total) rectally daily at 6 (six) AM. 04/03/18  Yes Angiulli, Mcarthur Rossetti, PA-C  carvedilol (COREG) 12.5 MG tablet Take 1 tablet (12.5 mg total) by mouth 2 (two) times daily with a meal. 04/03/18  Yes Angiulli, Mcarthur Rossetti, PA-C  cefTRIAXone (ROCEPHIN) 1 g injection Inject 1 g  into the muscle daily. For 7 days for pneumonia. 04/30/18 05/07/18 Yes [provider]  hydrALAZINE (APRESOLINE) 50 MG tablet Take 1 tablet (50 mg total) by mouth every 8 (eight) hours. 04/03/18  Yes Angiulli, Mcarthur Rossettianiel J, PA-C  hydrochlorothiazide (HYDRODIURIL) 25 MG tablet Take 1 tablet (25 mg total) by mouth daily. 04/04/18  Yes Angiulli, Mcarthur Rossettianiel J, PA-C  nitrofurantoin, macrocrystal-monohydrate, (MACROBID) 100 MG capsule Take 100 mg by mouth every 12 (twelve) hours. 04/30/18  05/07/18 Yes [provider]  potassium chloride SA (K-DUR,KLOR-CON) 20 MEQ tablet Take 1 tablet (20 mEq total) by mouth 2 (two) times daily. 04/03/18  Yes Angiulli, Mcarthur Rossettianiel J, PA-C  tamsulosin (FLOMAX) 0.4 MG CAPS capsule Take 1 capsule (0.4 mg total) by mouth daily. 03/06/18  Yes Leroy SeaSingh, Prashant K, MD  vitamin C (VITAMIN C) 1000 MG tablet Take 1 tablet (1,000 mg total) by mouth 2 (two) times daily. 04/03/18  Yes Angiulli, Mcarthur Rossettianiel J, PA-C    Family History Family History  Problem Relation Age of Onset  . Heart disease Father     Social History Social History   Tobacco Use  . Smoking status: Never Smoker  . Smokeless tobacco: Never Used  Substance Use Topics  . Alcohol use: Yes    Comment: daily   . Drug use: Not on file     Allergies   Patient has no known allergies.   Review of Systems Review of Systems  Constitutional: Positive for fever. Negative for chills.  HENT: Negative for congestion, ear pain and sore throat.   Eyes: Negative for pain and visual disturbance.  Respiratory: Negative for cough and shortness of breath.   Cardiovascular: Negative for chest pain and palpitations.  Gastrointestinal: Positive for abdominal distention. Negative for abdominal pain, diarrhea and vomiting.       Indwelling foley  Genitourinary: Negative for dysuria and hematuria.  Musculoskeletal: Negative for arthralgias and back pain.  Skin: Negative for color change and rash.  Neurological: Negative for seizures, syncope and headaches.  All other systems reviewed and are negative.    Physical Exam Updated Vital Signs  ED Triage Vitals  Enc Vitals Group     BP 05/02/18 1337 (!) 82/54     Pulse Rate 05/02/18 1337 (!) 25     Resp 05/02/18 1337 (!) 36     Temp 05/02/18 1445 99.7 F (37.6 C)     Temp Source 05/02/18 1445 Rectal     SpO2 05/02/18 1337 (!) 77 %     Weight 05/02/18 1413 200 lb (90.7 kg)     Height --      Head Circumference --      Peak Flow --      Pain  Score 05/02/18 1346 0     Pain Loc --      Pain Edu? --      Excl. in GC? --     Physical Exam  Constitutional: He appears well-developed and well-nourished. No distress.  HENT:  Head: Normocephalic and atraumatic.  Mouth/Throat: No oropharyngeal exudate.  Eyes: Pupils are equal, round, and reactive to light. Conjunctivae and EOM are normal.  Neck: Normal range of motion. Neck supple.  Cardiovascular: Normal rate, regular rhythm, normal heart sounds and intact distal pulses.  No murmur heard. Pulmonary/Chest: Effort normal and breath sounds normal. No respiratory distress.  Abdominal: Soft. He exhibits distension. There is no tenderness.  Genitourinary: Rectal exam shows guaiac negative stool.  Musculoskeletal: Normal range of motion. He exhibits no edema or tenderness.  Neurological: He is alert.  Skin: Skin is warm and dry.  Psychiatric: He has a normal mood and affect.  Nursing note and vitals reviewed.    ED Treatments / Results  Labs (all labs ordered are listed, but only abnormal results are displayed) Labs Reviewed  COMPREHENSIVE METABOLIC PANEL - Abnormal; Notable for the following components:      Result Value   Sodium 131 (*)    Potassium 3.4 (*)    CO2 19 (*)    Glucose, Bld 119 (*)    BUN 82 (*)    Creatinine, Ser 1.67 (*)    Calcium 8.2 (*)    Total Protein 6.1 (*)    Albumin 2.3 (*)    AST 83 (*)    GFR calc non Af Amer 40 (*)    GFR calc Af Amer 47 (*)    All other components within normal limits  CBC WITH DIFFERENTIAL/PLATELET - Abnormal; Notable for the following components:   RBC 3.41 (*)    Hemoglobin 9.7 (*)    HCT 30.2 (*)    Abs Immature Granulocytes 0.10 (*)    All other components within normal limits  URINALYSIS, ROUTINE W REFLEX MICROSCOPIC - Abnormal; Notable for the following components:   Color, Urine YELLOW (*)    APPearance CLEAR (*)    Specific Gravity, Urine >1.030 (*)    Hgb urine dipstick TRACE (*)    Ketones, ur TRACE (*)     Protein, ur 30 (*)    Leukocytes, UA TRACE (*)    All other components within normal limits  URINALYSIS, MICROSCOPIC (REFLEX) - Abnormal; Notable for the following components:   Bacteria, UA FEW (*)    All other components within normal limits  CULTURE, BLOOD (ROUTINE X 2)  CULTURE, BLOOD (ROUTINE X 2)  I-STAT CG4 LACTIC ACID, ED  I-STAT CG4 LACTIC ACID, ED    EKG EKG Interpretation  Date/Time:  Thursday May 02 2018 13:37:47 EST Ventricular Rate:  80 PR Interval:    QRS Duration: 106 QT Interval:  356 QTC Calculation: 411 R Axis:   20 Text Interpretation:  Sinus rhythm Probable left atrial enlargement Confirmed by Virgina Norfolk [phone]) on 05/02/2018 1:56:29 PM Also confirmed by Virgina Norfolk [phone]), editor Josephine Igo ([id number])  on 05/02/2018 3:41:45 PM   Radiology Dg Chest Port 1 View  Result Date: 05/02/2018 CLINICAL DATA:  Fever EXAM: PORTABLE CHEST 1 VIEW COMPARISON:  03/03/2018 FINDINGS: The heart size and mediastinal contours are within normal limits. Both lungs are clear. The visualized skeletal structures are unremarkable. IMPRESSION: No active disease. Electronically Signed   By: Marlan Palau M.D.   On: 05/02/2018 14:35    Procedures .Critical Care Performed by: Virgina Norfolk, DO Authorized by: Virgina Norfolk, DO   Critical care provider statement:    Critical care time (minutes):  40   Critical care was necessary to treat or prevent imminent or life-threatening deterioration of the following conditions:  Sepsis   Critical care was time spent personally by me on the following activities:  Blood draw for specimens, development of treatment plan with patient or surrogate, discussions with primary provider, evaluation of patient's response to treatment, interpretation of cardiac output measurements, ordering and performing treatments and interventions, ordering and review of laboratory studies, ordering and review of radiographic studies, pulse oximetry,  re-evaluation of patient's condition, review of old charts, obtaining history from patient or surrogate and examination of patient   I assumed direction of critical care for this  patient from another provider in my specialty: no     (including critical care time)  Medications Ordered in ED Medications  lactated ringers bolus 500 mL (500 mLs Intravenous New Bag/Given 05/02/18 1541)  ceFEPIme (MAXIPIME) 2 g in sodium chloride 0.9 % 100 mL IVPB (has no administration in time range)  vancomycin (VANCOCIN) IVPB 750 mg/150 ml premix (has no administration in time range)  lactated ringers bolus 1,000 mL (1,000 mLs Intravenous New Bag/Given 05/02/18 1411)  lactated ringers bolus 1,000 mL (1,000 mLs Intravenous New Bag/Given 05/02/18 1411)  vancomycin (VANCOCIN) IVPB 1000 mg/200 mL premix (0 mg Intravenous Stopped 05/02/18 1520)  ceFEPIme (MAXIPIME) 2 g in sodium chloride 0.9 % 100 mL IVPB (0 g Intravenous Stopped 05/02/18 1445)  vancomycin (VANCOCIN) IVPB 1000 mg/200 mL premix (1,000 mg Intravenous New Bag/Given 05/02/18 1520)     Initial Impression / Assessment and Plan / ED Course  I have reviewed the triage vital signs and the nursing notes.  Pertinent labs & imaging results that were available during my care of the patient were reviewed by me and considered in my medical decision making (see chart for details).     GRASON BRAILSFORD is a 67 year old male with history of TBI, hypertension, spinal cord injury who presents to the ED with fever, hypotension.  Patient has been treated with Rocephin and Macrobid for urinary tract infection and pneumonia at his long-term nursing facility.  Has had symptoms for the last 3 days including fever.  Fever her mental status got worse today.  Decreased urinary output.  Patient has indwelling Foley catheter.  Upon my examination patient is alert and family member states that since he got IV fluids with EMS his mental status has improved.  Patient is at his  neurological baseline per family.  Family member states that patient has had some abdominal distention over the last 2 days but did have a bowel movement today.  Patient has hypotension, rectal temp of 99.7 but was 103 prior to arrival with Tylenol given before he showed up.  Concern for sepsis with likely pneumonia versus UTI.  Will get CT scan of the abdomen to rule out obstruction or other intra-abdominal source for infection.  Patient has no melena or hematochezia on exam.  Blood cultures, chest x-ray, urine studies ordered.  Patient empirically given IV vancomycin and IV cefepime.  Patient given 30 cc/kg bolus of fluids.  He already received 500 cc of fluid to EMS.  Possible viral process as well.  No concern for meningitis. No concern for GI bleed.  Following IV fluids blood pressure has improved.  Hemodynamically patient is stable.  Urinary output has increased.  Patient has mild AKI likely secondary to dehydration.  Lactic acid within normal limits.  Chest x-ray with no obvious pneumonia.  Urinalysis likely infected.  Otherwise no significant electrolyte abnormalities.  No significant leukocytosis.  Patient awaiting final results from CT abdomen pelvis and then to be admitted for further sepsis care.  Remained hemodynamically improved following IV fluids.   Patient signed out to Dr. Freida Busman with patient awaiting CT scan of abdomen and pelvis.  Please see his note for further results, evaluation, disposition the patient.  Final Clinical Impressions(s) / ED Diagnoses   Final diagnoses:  Sepsis, due to unspecified organism, unspecified whether acute organ dysfunction present [facility])  Urinary tract infection without hematuria, site unspecified    ED Discharge Orders    None       Virgina Norfolk, DO 05/02/18 1630

## 2018-05-02 NOTE — ED Triage Notes (Signed)
Pt arrives from [facility] via [facility] for fevers and confirmed pneumonia  and UTI. Pt has indwelling foley.

## 2018-05-02 NOTE — Progress Notes (Signed)
Attempted report at [id number] [phone]

## 2018-05-02 NOTE — ED Notes (Signed)
Patient transported to CT 

## 2018-05-02 NOTE — ED Notes (Addendum)
Pt's significant other requesting to speak with admitting MD, provider paged.

## 2018-05-02 NOTE — H&P (Signed)
History and Physical    Wayne Buckley ZOX:[id number] DOB: Aug 04, 1950 DOA: 05/02/2018  PCP: [name], MD Patient coming from: Nursing home  Chief Complaint: Fever  HPI: Wayne Buckley is a 67 y.o. male with medical history significant of traumatic brain injury, central cord syndrome, neurogenic bladder with indwelling Foley catheter, bilateral leg weakness, prior DVT, hypertension, alcohol abuse presenting to the hospital for evaluation of fever.  Patient has a history of traumatic brain injury and it is difficult for him to communicate.  History provided by wife at bedside.  Wife states that patient has been living in a nursing home since October after he had a fall.  States 5 days ago he fell out of his wheelchair at the nursing home and was found to be very agitated at that time.  3 days ago, he was noted to have a low-grade fever and was diagnosed with pneumonia and UTI at that time.  He was prescribed Macrobid and given a dose of Rocephin.  States patient continues to be febrile despite receiving antibiotics.  She was told yesterday that his urine culture grew MRSA.  States his abdomen has appeared distended since yesterday.  States patient has a\or which is not open, skin is intact.  Patient denies having any nausea, vomiting, or abdominal pain.  Wife states that he had big bowel movement yesterday.  She believes his mental status is improved since he has received IV fluids and antibiotics in the ED.  ED Course: Temperature 103 F prior to arrival.  Tachypneic.  Blood pressure 82/54 on arrival.  Now improved status post 2.5 L IV fluid boluses.  No leukocytosis.  Lactic acid checked twice in the ED normal.  Creatinine 1.6, was 0.6 a month ago.  UA showing trace ketones, protein, trace leukocytes, negative nitrite, and few bacteria on microscopic examination.  Chest x-ray showing no active disease.  Abdominal x-ray showing diffuse air-filled distention of the large bowel, possibly chronic large  bowel dysmotility or ileus.  CT abdomen pelvis showing moderate gaseous distention of the colon up to 9 cm at the level of the cecum with liquid stool noted within from cecum to distal descending colon. Findings may represent a large bowel dysmotility or ileus. Mechanical source of obstruction is identified. Circumferential thickening surrounding a small stool ball within the rectum. Mild stercoral proctitis could have this appearance.  Vancomycin, and cefepime given in the ED.  Review of Systems: As per HPI otherwise 10 point review of systems negative.  Past Medical History:  Diagnosis Date  . Hypertension   . TBI (traumatic brain injury) (HCC) 2013    Past Surgical History:  Procedure Laterality Date  . ANTERIOR CERVICAL DECOMP/DISCECTOMY FUSION N/A 02/27/2018   Procedure: ANTERIOR CERVICAL DECOMPRESSION/DISCECTOMY FUSION C6-7;  Surgeon: Donalee Citrin, MD;  Location: [facility] OR;  Service: Neurosurgery;  Laterality: N/A;  . CRANIOTOMY  04/28/2012   Procedure: CRANIOTOMY HEMATOMA EVACUATION SUBDURAL;  Surgeon: Reinaldo Meeker, MD;  Location: [facility] OR;  Service: Neurosurgery;  Laterality: Left;  Left Craniotomy for Subdural Hematoma  . RADIOLOGY WITH ANESTHESIA N/A 02/27/2018   Procedure: MRI WITH ANESTHESIA;  Surgeon: Radiologist, Medication, MD;  Location: [facility] OR;  Service: Radiology;  Laterality: N/A;  . VENA CAVA FILTER PLACEMENT Right 02/27/2018   Procedure: INSERTION VENA-CAVA FILTER;  Surgeon: Nada Libman, MD;  Location: [facility] OR;  Service: Vascular;  Laterality: Right;     reports that he has never smoked. He has never used smokeless tobacco. He reports that  he drinks alcohol. His drug history is not on file.  No Known Allergies  Family History  Problem Relation Age of Onset  . Heart disease Father     Prior to Admission medications   Medication Sig Start Date End Date Taking? Authorizing Provider  acetaminophen (TYLENOL) 325 MG tablet Take 2 tablets (650 mg total) by mouth every 6  (six) hours as needed for mild pain. 04/03/18  Yes Angiulli, Mcarthur Rossettianiel J, PA-C  acetaminophen (TYLENOL) 500 MG tablet Take 1,000 mg by mouth every 6 (six) hours as needed for mild pain.   Yes [provider]  amLODipine (NORVASC) 10 MG tablet Take 1 tablet (10 mg total) by mouth daily. 03/06/18  Yes Leroy SeaSingh, Prashant K, MD  apixaban (ELIQUIS) 5 MG TABS tablet Take 1 tablet (5 mg total) by mouth 2 (two) times daily. 04/03/18  Yes Angiulli, Mcarthur Rossettianiel J, PA-C  bisacodyl (DULCOLAX) 10 MG suppository Place 1 suppository (10 mg total) rectally daily at 6 (six) AM. 04/03/18  Yes Angiulli, Mcarthur Rossettianiel J, PA-C  carvedilol (COREG) 12.5 MG tablet Take 1 tablet (12.5 mg total) by mouth 2 (two) times daily with a meal. 04/03/18  Yes Angiulli, Mcarthur Rossettianiel J, PA-C  cefTRIAXone (ROCEPHIN) 1 g injection Inject 1 g into the muscle daily. For 7 days for pneumonia. 04/30/18 05/07/18 Yes [provider]  hydrALAZINE (APRESOLINE) 50 MG tablet Take 1 tablet (50 mg total) by mouth every 8 (eight) hours. 04/03/18  Yes Angiulli, Mcarthur Rossettianiel J, PA-C  hydrochlorothiazide (HYDRODIURIL) 25 MG tablet Take 1 tablet (25 mg total) by mouth daily. 04/04/18  Yes Angiulli, Mcarthur Rossettianiel J, PA-C  nitrofurantoin, macrocrystal-monohydrate, (MACROBID) 100 MG capsule Take 100 mg by mouth every 12 (twelve) hours. 04/30/18 05/07/18 Yes [provider]  potassium chloride SA (K-DUR,KLOR-CON) 20 MEQ tablet Take 1 tablet (20 mEq total) by mouth 2 (two) times daily. 04/03/18  Yes Angiulli, Mcarthur Rossettianiel J, PA-C  tamsulosin (FLOMAX) 0.4 MG CAPS capsule Take 1 capsule (0.4 mg total) by mouth daily. 03/06/18  Yes Leroy SeaSingh, Prashant K, MD  vitamin C (VITAMIN C) 1000 MG tablet Take 1 tablet (1,000 mg total) by mouth 2 (two) times daily. 04/03/18  Yes Charlton Amorngiulli, Daniel J, PA-C    Physical Exam: Vitals:   05/02/18 1945 05/02/18 1957 05/02/18 2000 05/02/18 2215  BP: 112/80  111/63 104/72  Pulse:  95 89 93  Resp:  (!) 27 (!) 25 18  Temp:    99.1 F (37.3 C)    TempSrc:    Oral  SpO2:  95% 94% 97%  Weight:    91.2 kg   Physical Exam  Constitutional: He is oriented to person, place, and time. He appears well-developed and well-nourished. No distress.  HENT:  Head: Normocephalic.  Mouth/Throat: Oropharynx is clear and moist.  Eyes: Right eye exhibits no discharge. Left eye exhibits no discharge.  Neck: Neck supple. No tracheal deviation present.  Cardiovascular: Normal rate, regular rhythm and intact distal pulses.  Pulmonary/Chest: He has no wheezes.  Anterior lung fields clear to auscultation  Abdominal: Bowel sounds are normal. He exhibits distension. There is no tenderness.  Abdomen severely distended  Musculoskeletal: He exhibits no edema.  Neurological: He is alert and oriented to person, place, and time.  Skin: Skin is warm and dry. He is not diaphoretic.     Labs on Admission: I have personally reviewed following labs and imaging studies  CBC: Recent Labs  Lab 05/02/18 1402  WBC 7.8  NEUTROABS 6.5  HGB 9.7*  HCT 30.2*  MCV 88.6  PLT 253   Basic Metabolic Panel: Recent Labs  Lab 05/02/18 1402  NA 131*  K 3.4*  CL 102  CO2 19*  GLUCOSE 119*  BUN 82*  CREATININE 1.67*  CALCIUM 8.2*   GFR: Estimated Creatinine Clearance: 48.9 mL/min (A) (by C-G formula based on SCr of 1.67 mg/dL (H)). Liver Function Tests: Recent Labs  Lab 05/02/18 1402  AST 83*  ALT 36  ALKPHOS 67  BILITOT 0.5  PROT 6.1*  ALBUMIN 2.3*   No results for input(s): LIPASE, AMYLASE in the last 168 hours. No results for input(s): AMMONIA in the last 168 hours. Coagulation Profile: No results for input(s): INR, PROTIME in the last 168 hours. Cardiac Enzymes: No results for input(s): CKTOTAL, CKMB, CKMBINDEX, TROPONINI in the last 168 hours. BNP (last 3 results) No results for input(s): PROBNP in the last 8760 hours. HbA1C: No results for input(s): HGBA1C in the last 72 hours. CBG: No results for input(s): GLUCAP in the last 168  hours. Lipid Profile: No results for input(s): CHOL, HDL, LDLCALC, TRIG, CHOLHDL, LDLDIRECT in the last 72 hours. Thyroid Function Tests: No results for input(s): TSH, T4TOTAL, FREET4, T3FREE, THYROIDAB in the last 72 hours. Anemia Panel: No results for input(s): VITAMINB12, FOLATE, FERRITIN, TIBC, IRON, RETICCTPCT in the last 72 hours. Urine analysis:    Component Value Date/Time   COLORURINE YELLOW (A) 05/02/2018 1403   APPEARANCEUR CLEAR (A) 05/02/2018 1403   LABSPEC >1.030 (H) 05/02/2018 1403   PHURINE 5.0 05/02/2018 1403   GLUCOSEU NEGATIVE 05/02/2018 1403   HGBUR TRACE (A) 05/02/2018 1403   BILIRUBINUR NEGATIVE 05/02/2018 1403   KETONESUR TRACE (A) 05/02/2018 1403   PROTEINUR 30 (A) 05/02/2018 1403   UROBILINOGEN 0.2 05/08/2012 0314   NITRITE NEGATIVE 05/02/2018 1403   LEUKOCYTESUR TRACE (A) 05/02/2018 1403    Radiological Exams on Admission: Ct Abdomen Pelvis Wo Contrast  Result Date: 05/02/2018 CLINICAL DATA:  Abdominal distention EXAM: CT ABDOMEN AND PELVIS WITHOUT CONTRAST TECHNIQUE: Multidetector CT imaging of the abdomen and pelvis was performed following the standard protocol without IV contrast. COMPARISON:  Same day radiographs of the abdomen and pelvis FINDINGS: Lower chest: Top-normal heart size. Trace pleural effusions with bibasilar atelectasis. Hepatobiliary: No focal liver abnormality is seen. No gallstones, gallbladder wall thickening, or biliary dilatation. Pancreas: Unremarkable. No pancreatic ductal dilatation or surrounding inflammatory changes. Spleen: Normal in size without focal abnormality. Adrenals/Urinary Tract: Normal bilateral adrenal glands. No nephrolithiasis. Motion artifacts limit assessment at the level of the kidneys however there does appear to be a hypodense cystic lesion of the interpolar left kidney measuring up to 6 cm. No nephrolithiasis nor hydroureteronephrosis. Foley decompressed urinary bladder. Stomach/Bowel: Small hiatal hernia.  Decompressed stomach and small bowel loops. Normal small bowel rotation. Moderate gaseous distention of the colon up to 9 cm at the level of the cecum with liquid stool noted within from cecum to distal descending colon likely representing large bowel dysmotility or ileus. Circumferential thickening surrounding a small stool ball is noted within the rectum. Mild stercoral proctitis is not excluded. Vascular/Lymphatic: IVC filter in place. Atherosclerosis of the aorta without aneurysm. No adenopathy. Reproductive: Normal size prostate. Other: Soft tissue anasarca. No free air or free fluid. Musculoskeletal: Status post ventral hernia repair. Small amount of fat in the right inguinal canal. IMPRESSION: 1. Moderate gaseous distention of the colon up to 9 cm at the level of the cecum with liquid stool noted within from cecum to distal descending colon. Findings may represent a  large bowel dysmotility or ileus. Mechanical source of obstruction is identified. 2. Circumferential thickening surrounding a small stool ball within the rectum. Mild stercoral proctitis could have this appearance. 3. 6 cm hypodense cystic lesion of the interpolar left kidney, likely a simple cyst however further characterization is limited due to motion. 4. IVC filter in place. 5. Mild soft tissue anasarca. Electronically Signed   By: Tollie Eth M.D.   On: 05/02/2018 16:46   Dg Abdomen 1 View  Result Date: 05/02/2018 CLINICAL DATA:  67 y/o  M; abdominal distention. EXAM: ABDOMEN - 1 VIEW COMPARISON:  05/02/2018 CT abdomen and pelvis. FINDINGS: Diffuse air-filled distention of the large bowel. Upper abdomen is excluded from the field of view. IVC filter in situ. Advanced degenerative changes of the visible lumbar spine. Lower abdominal wall mesh hernia repair anchors noted. IMPRESSION: Diffuse air-filled distention of the large bowel, possibly chronic large bowel dysmotility or ileus. Electronically Signed   By: Mitzi Hansen M.D.    On: 05/02/2018 20:39   Dg Chest Port 1 View  Result Date: 05/02/2018 CLINICAL DATA:  Fever EXAM: PORTABLE CHEST 1 VIEW COMPARISON:  03/03/2018 FINDINGS: The heart size and mediastinal contours are within normal limits. Both lungs are clear. The visualized skeletal structures are unremarkable. IMPRESSION: No active disease. Electronically Signed   By: Marlan Palau M.D.   On: 05/02/2018 14:35    EKG: Independently reviewed.  Sinus rhythm (heart rate 80), similar to prior tracing.  Assessment/Plan Principal Problem:   Sepsis (HCC) Active Problems:   Alcohol abuse   AKI (acute kidney injury) (HCC)   Essential hypertension   Bowel obstruction (HCC)   Chronic anemia   Hyponatremia   Sacral ulcer (HCC)   History of DVT (deep vein thrombosis)   Traumatic brain injury (HCC)   Sepsis, suspect UTI Temperature 103 F prior to arrival.  Tachypneic.  Blood pressure 82/54 on arrival; now improved status post IV fluid boluses in the ED.  Labs showing no leukocytosis.  Lactic acid x2 normal.  Patient has a history of TBI and is difficult for him to communicate.  UA showing trace leukocytes, negative nitrite, and few bacteria on microscopic examination.  He is at high risk for UTI due to history of neurogenic bladder and indwelling Foley catheter.  Chest x-ray showing no active disease.  Nursing staff reported a sacral ulcer with intact skin.  CT abdomen pelvis with evidence of large bowel mechanical obstruction. -Continue broad-spectrum antibiotics: Vancomycin and cefepime -Continue IV fluid resuscitation -Check procalcitonin level -Blood culture x2 pending -Urine culture pending  AKI BUN 82.  Creatinine 1.6, was 0.6 a month ago.  Likely prerenal due to dehydration. -Continue IV fluid resuscitation -Avoid nephrotoxic agents/contrast -Repeat BMP in a.m.  Bowel obstruction.    CT abdomen pelvis showing moderate gaseous distention of the colon up to 9 cm at the level of the cecum with liquid  stool noted within from cecum to distal descending colon. Findings may represent a large bowel dysmotility or ileus. Mechanical source of obstruction is identified. Circumferential thickening surrounding a small stool ball within the rectum. Mild stercoral proctitis could have this appearance. -General surgery has been consulted; appreciate recs -Keep n.p.o. -Continue IV fluid  Chronic anemia -Hemoglobin 9.7, baseline 10-11.  No evidence of GI bleed on CT.  No signs of active bleeding. -Repeat CBC in a.m. to confirm.  If repeat CBC continues to show low hemoglobin, he will need FOBT.  Mild hyponatremia Sodium 131. ?decresed PO intake.  Unable to obtain a thorough history from the patient. -Continue IV fluid for sepsis -Repeat BMP in a.m.  Sacral ulcer Skin is intact per nursing staff evaluation. -Wound care consult  History of DVT -Hold home Eliquis at this time.  Pending general surgery recommendations for bowel obstruction.  History of TBI, bilateral leg weakness -PT evaluation  HTN -Currently normotensive -Hold home antihypertensives in the setting of sepsis  History of alcohol abuse Unable to obtain a history from the patient. -CIWA protocol; Ativan PRN  DVT prophylaxis: SCDs Code Status: Patient wishes to be full code. Family Communication: [name]. Disposition Plan: Anticipate discharge to home in 2 to 3 days. Consults called: General surgery (Dr. Cliffton Asters) Admission status: It is my clinical opinion that admission to INPATIENT is reasonable and necessary in this 67 y.o. male . presenting with symptoms of sepsis and abdominal distention, concerning for sepsis secondary to suspected UTI and large bowel obstruction . in the context of PMH including: Traumatic brain injury neurogenic bladder with chronic indwelling Foley . with pertinent positives on physical exam including: Abdomen severely distended . and pertinent positives on radiographic and laboratory data  including: CT abdomen pelvis with evidence of mechanical large bowel obstruction. . Workup and treatment include IV fluids, IV antibiotics, bowel rest.  Pending surgery evaluation.  Given the aforementioned, the predictability of an adverse outcome is felt to be significant. I expect that the patient will require at least 2 midnights in the hospital to treat this condition.   John Giovanni MD [facility] Pager [phone]  If 7PM-7AM, please contact night-coverage [email or web address] Password [email or web address]  05/03/2018, 12:10 AM

## 2018-05-02 NOTE — ED Provider Notes (Signed)
Patient signed out to me by prior provider pending abdominal CT.  Abdominal CT without signs of obstruction.  Patient has received treatment for sepsis of likely urine etiology and I will consult the hospitalist for admission.   Lorre NickAllen, Adib Wahba, MD 05/02/18 [phone]

## 2018-05-03 ENCOUNTER — Inpatient Hospital Stay (HOSPITAL_COMMUNITY): Payer: Medicare Other

## 2018-05-03 DIAGNOSIS — B9562 Methicillin resistant Staphylococcus aureus infection as the cause of diseases classified elsewhere: Secondary | ICD-10-CM

## 2018-05-03 DIAGNOSIS — S14129S Central cord syndrome at unspecified level of cervical spinal cord, sequela: Secondary | ICD-10-CM

## 2018-05-03 DIAGNOSIS — L98429 Non-pressure chronic ulcer of back with unspecified severity: Secondary | ICD-10-CM

## 2018-05-03 DIAGNOSIS — D649 Anemia, unspecified: Secondary | ICD-10-CM

## 2018-05-03 DIAGNOSIS — K5981 Ogilvie syndrome: Secondary | ICD-10-CM | POA: Insufficient documentation

## 2018-05-03 DIAGNOSIS — F1010 Alcohol abuse, uncomplicated: Secondary | ICD-10-CM

## 2018-05-03 DIAGNOSIS — S069XAA Unspecified intracranial injury with loss of consciousness status unknown, initial encounter: Secondary | ICD-10-CM

## 2018-05-03 DIAGNOSIS — R7881 Bacteremia: Secondary | ICD-10-CM

## 2018-05-03 DIAGNOSIS — I10 Essential (primary) hypertension: Secondary | ICD-10-CM

## 2018-05-03 DIAGNOSIS — Z960 Presence of urogenital implants: Secondary | ICD-10-CM

## 2018-05-03 DIAGNOSIS — N319 Neuromuscular dysfunction of bladder, unspecified: Secondary | ICD-10-CM

## 2018-05-03 DIAGNOSIS — S069X9A Unspecified intracranial injury with loss of consciousness of unspecified duration, initial encounter: Secondary | ICD-10-CM

## 2018-05-03 DIAGNOSIS — K598 Other specified functional intestinal disorders: Secondary | ICD-10-CM

## 2018-05-03 DIAGNOSIS — E871 Hypo-osmolality and hyponatremia: Secondary | ICD-10-CM

## 2018-05-03 DIAGNOSIS — Z86718 Personal history of other venous thrombosis and embolism: Secondary | ICD-10-CM

## 2018-05-03 DIAGNOSIS — Z87820 Personal history of traumatic brain injury: Secondary | ICD-10-CM

## 2018-05-03 DIAGNOSIS — X58XXXS Exposure to other specified factors, sequela: Secondary | ICD-10-CM

## 2018-05-03 LAB — BLOOD CULTURE ID PANEL (REFLEXED)
Acinetobacter baumannii: NOT DETECTED
CANDIDA KRUSEI: NOT DETECTED
CANDIDA TROPICALIS: NOT DETECTED
Candida albicans: NOT DETECTED
Candida glabrata: NOT DETECTED
Candida parapsilosis: NOT DETECTED
ENTEROCOCCUS SPECIES: NOT DETECTED
Enterobacter cloacae complex: NOT DETECTED
Enterobacteriaceae species: NOT DETECTED
Escherichia coli: NOT DETECTED
Haemophilus influenzae: NOT DETECTED
KLEBSIELLA PNEUMONIAE: NOT DETECTED
Klebsiella oxytoca: NOT DETECTED
Listeria monocytogenes: NOT DETECTED
Methicillin resistance: DETECTED — AB
NEISSERIA MENINGITIDIS: NOT DETECTED
PROTEUS SPECIES: NOT DETECTED
Pseudomonas aeruginosa: NOT DETECTED
SERRATIA MARCESCENS: NOT DETECTED
STAPHYLOCOCCUS AUREUS BCID: DETECTED — AB
STAPHYLOCOCCUS SPECIES: DETECTED — AB
STREPTOCOCCUS SPECIES: NOT DETECTED
Streptococcus agalactiae: NOT DETECTED
Streptococcus pneumoniae: NOT DETECTED
Streptococcus pyogenes: NOT DETECTED

## 2018-05-03 LAB — BASIC METABOLIC PANEL
Anion gap: 9 (ref 5–15)
BUN: 68 mg/dL — AB (ref 8–23)
CALCIUM: 8.4 mg/dL — AB (ref 8.9–10.3)
CHLORIDE: 104 mmol/L (ref 98–111)
CO2: 21 mmol/L — AB (ref 22–32)
CREATININE: 1.16 mg/dL (ref 0.61–1.24)
GFR calc non Af Amer: >60 mL/min (ref >60)
Glucose, Bld: 110 mg/dL — ABNORMAL HIGH (ref 70–99)
Potassium: 3.3 mmol/L — ABNORMAL LOW (ref 3.5–5.1)
Sodium: 134 mmol/L — ABNORMAL LOW (ref 135–145)

## 2018-05-03 LAB — CBC
HCT: 28.1 % — ABNORMAL LOW (ref 39.0–52.0)
Hemoglobin: 8.9 g/dL — ABNORMAL LOW (ref 13.0–17.0)
MCH: 27.6 pg (ref 26.0–34.0)
MCHC: 31.7 g/dL (ref 30.0–36.0)
MCV: 87 fL (ref 80.0–100.0)
Platelets: 256 10*3/uL (ref 150–400)
RBC: 3.23 MIL/uL — ABNORMAL LOW (ref 4.22–5.81)
RDW: 14.6 % (ref 11.5–15.5)
WBC: 7.3 10*3/uL (ref 4.0–10.5)
nRBC: 0 % (ref 0.0–0.2)

## 2018-05-03 LAB — MRSA PCR SCREENING: MRSA BY PCR: POSITIVE — AB

## 2018-05-03 LAB — MAGNESIUM: Magnesium: 2.2 mg/dL (ref 1.7–2.4)

## 2018-05-03 LAB — GLUCOSE, CAPILLARY: GLUCOSE-CAPILLARY: 97 mg/dL (ref 70–99)

## 2018-05-03 LAB — APTT: APTT: 53 s — AB (ref 24–36)

## 2018-05-03 LAB — PHOSPHORUS: PHOSPHORUS: 2.9 mg/dL (ref 2.5–4.6)

## 2018-05-03 LAB — PROCALCITONIN: PROCALCITONIN: 6.29 ng/mL

## 2018-05-03 MED ORDER — POTASSIUM CHLORIDE 10 MEQ/100ML IV SOLN
10.0000 meq | INTRAVENOUS | Status: AC
  Administered 2018-05-03 (×4): 10 meq via INTRAVENOUS
  Filled 2018-05-03 (×4): qty 100

## 2018-05-03 MED ORDER — ZINC OXIDE 40 % EX OINT
TOPICAL_OINTMENT | Freq: Four times a day (QID) | CUTANEOUS | Status: DC
  Administered 2018-05-03 – 2018-05-06 (×11): via TOPICAL
  Administered 2018-05-06: 1 via TOPICAL
  Administered 2018-05-07 – 2018-05-31 (×79): via TOPICAL
  Filled 2018-05-03 (×4): qty 57

## 2018-05-03 MED ORDER — HEPARIN (PORCINE) 25000 UT/250ML-% IV SOLN
2350.0000 [IU]/h | INTRAVENOUS | Status: DC
  Administered 2018-05-03: 1250 [IU]/h via INTRAVENOUS
  Administered 2018-05-04: 1750 [IU]/h via INTRAVENOUS
  Administered 2018-05-05: 1950 [IU]/h via INTRAVENOUS
  Administered 2018-05-06 – 2018-05-07 (×3): 2250 [IU]/h via INTRAVENOUS
  Filled 2018-05-03 (×7): qty 250

## 2018-05-03 MED ORDER — DEXTROSE-NACL 5-0.45 % IV SOLN
INTRAVENOUS | Status: DC
  Administered 2018-05-03 – 2018-05-04 (×2): via INTRAVENOUS

## 2018-05-03 MED ORDER — APIXABAN 5 MG PO TABS: 5.0000 mg | ORAL_TABLET | Freq: Two times a day (BID) | ORAL | Status: DC

## 2018-05-03 NOTE — Consult Note (Signed)
CC: Consult for dilated large bowel by Dr. Marlowe Sax  HPI: Wayne Buckley is an 67 y.o. male with hx of HTN, TBI, central cord syndrome, neurogenic bladder with chronic indwelling foley, prior DVT whom presented to ED 11/14 with fever - has been found to have pneumonia and UTI in last 3 days at the nursing facility that he resides - he has been there since a fall 03/2018. Prior to this he lived at home with his wife. Over the last 3d, he has been given rocephin + macrobid but was still having fevers so was brought here. Noted in ED to be febrile, tachypneic and was hypotensive on arrival - following fluid resuscitation he improved markedly. Patient reports passing gas and having BMs. He denies any abdominal pain but had a distended abdomen and underwent CT.   **Much of the history has been obtained by chart review as the patient is a poor historian; his wife was not reachable by phone tonight  Past Medical History:  Diagnosis Date  . Hypertension   . TBI (traumatic brain injury) ([place]) 2013    Past Surgical History:  Procedure Laterality Date  . ANTERIOR CERVICAL DECOMP/DISCECTOMY FUSION N/A 02/27/2018   Procedure: ANTERIOR CERVICAL DECOMPRESSION/DISCECTOMY FUSION C6-7;  Surgeon: Kary Kos, MD;  Location: [facility];  Service: Neurosurgery;  Laterality: N/A;  . CRANIOTOMY  04/28/2012   Procedure: CRANIOTOMY HEMATOMA EVACUATION SUBDURAL;  Surgeon: Faythe Ghee, MD;  Location: [facility];  Service: Neurosurgery;  Laterality: Left;  Left Craniotomy for Subdural Hematoma  . RADIOLOGY WITH ANESTHESIA N/A 02/27/2018   Procedure: MRI WITH ANESTHESIA;  Surgeon: Radiologist, Medication, MD;  Location: [facility];  Service: Radiology;  Laterality: N/A;  . VENA CAVA FILTER PLACEMENT Right 02/27/2018   Procedure: INSERTION VENA-CAVA FILTER;  Surgeon: Serafina Mitchell, MD;  Location: [facility];  Service: Vascular;  Laterality: Right;    Family History  Problem Relation Age of Onset  . Heart disease Father     Social:   reports that he has never smoked. He has never used smokeless tobacco. He reports that he drinks alcohol. His drug history is not on file.  Allergies: No Known Allergies  Medications: I have reviewed the patient's current medications.  Results for orders placed or performed during the hospital encounter of 05/02/18 (from the past 48 hour(s))  Comprehensive metabolic panel     Status: Abnormal   Collection Time: 05/02/18  2:02 PM  Result Value Ref Range   Sodium 131 (L) 135 - 145 mmol/L   Potassium 3.4 (L) 3.5 - 5.1 mmol/L   Chloride 102 98 - 111 mmol/L   CO2 19 (L) 22 - 32 mmol/L   Glucose, Bld 119 (H) 70 - 99 mg/dL   BUN 82 (H) 8 - 23 mg/dL   Creatinine, Ser 1.67 (H) 0.61 - 1.24 mg/dL   Calcium 8.2 (L) 8.9 - 10.3 mg/dL   Total Protein 6.1 (L) 6.5 - 8.1 g/dL   Albumin 2.3 (L) 3.5 - 5.0 g/dL   AST 83 (H) 15 - 41 U/L   ALT 36 0 - 44 U/L   Alkaline Phosphatase 67 38 - 126 U/L   Total Bilirubin 0.5 0.3 - 1.2 mg/dL   GFR calc non Af Amer 40 (L) >60 mL/min   GFR calc Af Amer 47 (L) >60 mL/min    Comment: (NOTE) The eGFR has been calculated using the CKD EPI equation. This calculation has not been validated in all clinical situations. eGFR's persistently <60 mL/min signify  possible Chronic Kidney Disease.    Anion gap 10 5 - 15    Comment: Performed at [facility] [address] [place] [address]  CBC WITH DIFFERENTIAL     Status: Abnormal   Collection Time: 05/02/18  2:02 PM  Result Value Ref Range   WBC 7.8 4.0 - 10.5 K/uL   RBC 3.41 (L) 4.22 - 5.81 MIL/uL   Hemoglobin 9.7 (L) 13.0 - 17.0 g/dL   HCT 30.2 (L) 39.0 - 52.0 %   MCV 88.6 80.0 - 100.0 fL   MCH 28.4 26.0 - 34.0 pg   MCHC 32.1 30.0 - 36.0 g/dL   RDW 14.7 11.5 - 15.5 %   Platelets 253 150 - 400 K/uL   nRBC 0.0 0.0 - 0.2 %   Neutrophils Relative % 84 %   Neutro Abs 6.5 1.7 - 7.7 K/uL   Lymphocytes Relative 8 %   Lymphs Abs 0.7 0.7 - 4.0 K/uL   Monocytes Relative 7 %   Monocytes Absolute 0.5 0.1  - 1.0 K/uL   Eosinophils Relative 0 %   Eosinophils Absolute 0.0 0.0 - 0.5 K/uL   Basophils Relative 0 %   Basophils Absolute 0.0 0.0 - 0.1 K/uL   Immature Granulocytes 1 %   Abs Immature Granulocytes 0.10 (H) 0.00 - 0.07 K/uL    Comment: Performed at [facility] [address] [place], [place] [address]  Urinalysis, Routine w reflex microscopic     Status: Abnormal   Collection Time: 05/02/18  2:03 PM  Result Value Ref Range   Color, Urine YELLOW (A) YELLOW    Comment: BIOCHEMICALS MAY BE AFFECTED BY COLOR CORRECTED ON 11/14 AT 1522: PREVIOUSLY REPORTED AS YELLOW    APPearance CLEAR (A) CLEAR   Specific Gravity, Urine >1.030 (H) 1.005 - 1.030   pH 5.0 5.0 - 8.0   Glucose, UA NEGATIVE NEGATIVE mg/dL   Hgb urine dipstick TRACE (A) NEGATIVE   Bilirubin Urine NEGATIVE NEGATIVE   Ketones, ur TRACE (A) NEGATIVE mg/dL   Protein, ur 30 (A) NEGATIVE mg/dL   Nitrite NEGATIVE NEGATIVE   Leukocytes, UA TRACE (A) NEGATIVE    Comment: Performed at [facility], [place] [address] [place], [place] [address]  Urinalysis, Microscopic (reflex)     Status: Abnormal   Collection Time: 05/02/18  2:03 PM  Result Value Ref Range   RBC / HPF 6-10 0 - 5 RBC/hpf   WBC, UA 6-10 0 - 5 WBC/hpf   Bacteria, UA FEW (A) NONE SEEN   Squamous Epithelial / LPF 0-5 0 - 5    Comment: Performed at [facility], [place] [address] [place], [place] [address]  I-Stat CG4 Lactic Acid, ED     Status: None   Collection Time: 05/02/18  2:12 PM  Result Value Ref Range   Lactic Acid, Venous 1.13 0.5 - 1.9 mmol/L  I-Stat CG4 Lactic Acid, ED     Status: None   Collection Time: 05/02/18  4:44 PM  Result Value Ref Range   Lactic Acid, Venous 1.66 0.5 - 1.9 mmol/L    Ct Abdomen Pelvis Wo Contrast  Result Date: 05/02/2018 CLINICAL DATA:  Abdominal distention EXAM: CT ABDOMEN AND PELVIS WITHOUT CONTRAST TECHNIQUE: Multidetector CT imaging of the abdomen and pelvis was performed following the standard  protocol without IV contrast. COMPARISON:  Same day radiographs of the abdomen and pelvis FINDINGS: Lower chest: Top-normal heart size. Trace pleural effusions with bibasilar atelectasis.  Hepatobiliary: No focal liver abnormality is seen. No gallstones, gallbladder wall thickening, or biliary dilatation. Pancreas: Unremarkable. No pancreatic ductal dilatation or surrounding inflammatory changes. Spleen: Normal in size without focal abnormality. Adrenals/Urinary Tract: Normal bilateral adrenal glands. No nephrolithiasis. Motion artifacts limit assessment at the level of the kidneys however there does appear to be a hypodense cystic lesion of the interpolar left kidney measuring up to 6 cm. No nephrolithiasis nor hydroureteronephrosis. Foley decompressed urinary bladder. Stomach/Bowel: Small hiatal hernia. Decompressed stomach and small bowel loops. Normal small bowel rotation. Moderate gaseous distention of the colon up to 9 cm at the level of the cecum with liquid stool noted within from cecum to distal descending colon likely representing large bowel dysmotility or ileus. Circumferential thickening surrounding a small stool ball is noted within the rectum. Mild stercoral proctitis is not excluded. Vascular/Lymphatic: IVC filter in place. Atherosclerosis of the aorta without aneurysm. No adenopathy. Reproductive: Normal size prostate. Other: Soft tissue anasarca. No free air or free fluid. Musculoskeletal: Status post ventral hernia repair. Small amount of fat in the right inguinal canal. IMPRESSION: 1. Moderate gaseous distention of the colon up to 9 cm at the level of the cecum with liquid stool noted within from cecum to distal descending colon. Findings may represent a large bowel dysmotility or ileus. Mechanical source of obstruction is identified. 2. Circumferential thickening surrounding a small stool ball within the rectum. Mild stercoral proctitis could have this appearance. 3. 6 cm hypodense cystic lesion  of the interpolar left kidney, likely a simple cyst however further characterization is limited due to motion. 4. IVC filter in place. 5. Mild soft tissue anasarca. Electronically Signed   By: Ashley Royalty M.D.   On: 05/02/2018 16:46   Dg Abdomen 1 View  Result Date: 05/02/2018 CLINICAL DATA:  67 y/o  M; abdominal distention. EXAM: ABDOMEN - 1 VIEW COMPARISON:  05/02/2018 CT abdomen and pelvis. FINDINGS: Diffuse air-filled distention of the large bowel. Upper abdomen is excluded from the field of view. IVC filter in situ. Advanced degenerative changes of the visible lumbar spine. Lower abdominal wall mesh hernia repair anchors noted. IMPRESSION: Diffuse air-filled distention of the large bowel, possibly chronic large bowel dysmotility or ileus. Electronically Signed   By: Kristine Garbe M.D.   On: 05/02/2018 20:39   Dg Chest Port 1 View  Result Date: 05/02/2018 CLINICAL DATA:  Fever EXAM: PORTABLE CHEST 1 VIEW COMPARISON:  03/03/2018 FINDINGS: The heart size and mediastinal contours are within normal limits. Both lungs are clear. The visualized skeletal structures are unremarkable. IMPRESSION: No active disease. Electronically Signed   By: Franchot Gallo M.D.   On: 05/02/2018 14:35    ROS - all of the below systems have been reviewed with the patient and positives are indicated with bold text Unobtainable 2/2 TBI/poor historian  PE Blood pressure 104/72, pulse 93, temperature 99.1 F (37.3 C), temperature source Oral, resp. rate 18, weight 91.2 kg, SpO2 97 %. Constitutional: NAD; conversant; no deformities Eyes: Moist conjunctiva; no lid lag; anicteric; PERRL Neck: Trachea midline; no thyromegaly Lungs: Normal respiratory effort; no tactile fremitus CV: RRR; no palpable thrills; no pitting edema GI: Abd soft, nontender throughout; moderately distended; no rebound; no guarding; no palpable hepatosplenomegaly MSK: Unable to walk; no clubbing/cyanosis Psychiatric: alert and oriented  x3; tangential thoughts; speech disorganized Lymphatic: No palpable cervical or axillary lymphadenopathy  Results for orders placed or performed during the hospital encounter of 05/02/18 (from the past 48 hour(s))  Comprehensive metabolic panel  Status: Abnormal   Collection Time: 05/02/18  2:02 PM  Result Value Ref Range   Sodium 131 (L) 135 - 145 mmol/L   Potassium 3.4 (L) 3.5 - 5.1 mmol/L   Chloride 102 98 - 111 mmol/L   CO2 19 (L) 22 - 32 mmol/L   Glucose, Bld 119 (H) 70 - 99 mg/dL   BUN 82 (H) 8 - 23 mg/dL   Creatinine, Ser 1.67 (H) 0.61 - 1.24 mg/dL   Calcium 8.2 (L) 8.9 - 10.3 mg/dL   Total Protein 6.1 (L) 6.5 - 8.1 g/dL   Albumin 2.3 (L) 3.5 - 5.0 g/dL   AST 83 (H) 15 - 41 U/L   ALT 36 0 - 44 U/L   Alkaline Phosphatase 67 38 - 126 U/L   Total Bilirubin 0.5 0.3 - 1.2 mg/dL   GFR calc non Af Amer 40 (L) >60 mL/min   GFR calc Af Amer 47 (L) >60 mL/min    Comment: (NOTE) The eGFR has been calculated using the CKD EPI equation. This calculation has not been validated in all clinical situations. eGFR's persistently <60 mL/min signify possible Chronic Kidney Disease.    Anion gap 10 5 - 15    Comment: Performed at [facility] [address], [place], [place] [address]  CBC WITH DIFFERENTIAL     Status: Abnormal   Collection Time: 05/02/18  2:02 PM  Result Value Ref Range   WBC 7.8 4.0 - 10.5 K/uL   RBC 3.41 (L) 4.22 - 5.81 MIL/uL   Hemoglobin 9.7 (L) 13.0 - 17.0 g/dL   HCT 30.2 (L) 39.0 - 52.0 %   MCV 88.6 80.0 - 100.0 fL   MCH 28.4 26.0 - 34.0 pg   MCHC 32.1 30.0 - 36.0 g/dL   RDW 14.7 11.5 - 15.5 %   Platelets 253 150 - 400 K/uL   nRBC 0.0 0.0 - 0.2 %   Neutrophils Relative % 84 %   Neutro Abs 6.5 1.7 - 7.7 K/uL   Lymphocytes Relative 8 %   Lymphs Abs 0.7 0.7 - 4.0 K/uL   Monocytes Relative 7 %   Monocytes Absolute 0.5 0.1 - 1.0 K/uL   Eosinophils Relative 0 %   Eosinophils Absolute 0.0 0.0 - 0.5 K/uL   Basophils Relative 0 %   Basophils Absolute 0.0  0.0 - 0.1 K/uL   Immature Granulocytes 1 %   Abs Immature Granulocytes 0.10 (H) 0.00 - 0.07 K/uL    Comment: Performed at [facility] [address] [place], [place] [address]  Urinalysis, Routine w reflex microscopic     Status: Abnormal   Collection Time: 05/02/18  2:03 PM  Result Value Ref Range   Color, Urine YELLOW (A) YELLOW    Comment: BIOCHEMICALS MAY BE AFFECTED BY COLOR CORRECTED ON 11/14 AT 1522: PREVIOUSLY REPORTED AS YELLOW    APPearance CLEAR (A) CLEAR   Specific Gravity, Urine >1.030 (H) 1.005 - 1.030   pH 5.0 5.0 - 8.0   Glucose, UA NEGATIVE NEGATIVE mg/dL   Hgb urine dipstick TRACE (A) NEGATIVE   Bilirubin Urine NEGATIVE NEGATIVE   Ketones, ur TRACE (A) NEGATIVE mg/dL   Protein, ur 30 (A) NEGATIVE mg/dL   Nitrite NEGATIVE NEGATIVE   Leukocytes, UA TRACE (A) NEGATIVE    Comment: Performed at [facility], [place] [address] [place], [place] [address]  Urinalysis, Microscopic (reflex)     Status: Abnormal   Collection Time: 05/02/18  2:03 PM  Result Value Ref Range   RBC / HPF 6-10 0 - 5 RBC/hpf   WBC, UA 6-10 0 - 5 WBC/hpf   Bacteria, UA FEW (A) NONE SEEN   Squamous Epithelial / LPF 0-5 0 - 5    Comment: Performed at [facility], [place] [address] [place], [place] [address]  I-Stat CG4 Lactic Acid, ED     Status: None   Collection Time: 05/02/18  2:12 PM  Result Value Ref Range   Lactic Acid, Venous 1.13 0.5 - 1.9 mmol/L  I-Stat CG4 Lactic Acid, ED     Status: None   Collection Time: 05/02/18  4:44 PM  Result Value Ref Range   Lactic Acid, Venous 1.66 0.5 - 1.9 mmol/L    Ct Abdomen Pelvis Wo Contrast  Result Date: 05/02/2018 CLINICAL DATA:  Abdominal distention EXAM: CT ABDOMEN AND PELVIS WITHOUT CONTRAST TECHNIQUE: Multidetector CT imaging of the abdomen and pelvis was performed following the standard protocol without IV contrast. COMPARISON:  Same day radiographs of the abdomen and pelvis FINDINGS: Lower chest: Top-normal heart size. Trace  pleural effusions with bibasilar atelectasis. Hepatobiliary: No focal liver abnormality is seen. No gallstones, gallbladder wall thickening, or biliary dilatation. Pancreas: Unremarkable. No pancreatic ductal dilatation or surrounding inflammatory changes. Spleen: Normal in size without focal abnormality. Adrenals/Urinary Tract: Normal bilateral adrenal glands. No nephrolithiasis. Motion artifacts limit assessment at the level of the kidneys however there does appear to be a hypodense cystic lesion of the interpolar left kidney measuring up to 6 cm. No nephrolithiasis nor hydroureteronephrosis. Foley decompressed urinary bladder. Stomach/Bowel: Small hiatal hernia. Decompressed stomach and small bowel loops. Normal small bowel rotation. Moderate gaseous distention of the colon up to 9 cm at the level of the cecum with liquid stool noted within from cecum to distal descending colon likely representing large bowel dysmotility or ileus. Circumferential thickening surrounding a small stool ball is noted within the rectum. Mild stercoral proctitis is not excluded. Vascular/Lymphatic: IVC filter in place. Atherosclerosis of the aorta without aneurysm. No adenopathy. Reproductive: Normal size prostate. Other: Soft tissue anasarca. No free air or free fluid. Musculoskeletal: Status post ventral hernia repair. Small amount of fat in the right inguinal canal. IMPRESSION: 1. Moderate gaseous distention of the colon up to 9 cm at the level of the cecum with liquid stool noted within from cecum to distal descending colon. Findings may represent a large bowel dysmotility or ileus. Mechanical source of obstruction is identified. 2. Circumferential thickening surrounding a small stool ball within the rectum. Mild stercoral proctitis could have this appearance. 3. 6 cm hypodense cystic lesion of the interpolar left kidney, likely a simple cyst however further characterization is limited due to motion. 4. IVC filter in place. 5. Mild  soft tissue anasarca. Electronically Signed   By: Ashley Royalty M.D.   On: 05/02/2018 16:46   Dg Abdomen 1 View  Result Date: 05/02/2018 CLINICAL DATA:  67 y/o  M; abdominal distention. EXAM: ABDOMEN - 1 VIEW COMPARISON:  05/02/2018 CT abdomen and pelvis. FINDINGS: Diffuse air-filled distention of the large bowel. Upper abdomen is excluded from the field of view. IVC filter in situ. Advanced degenerative changes of the visible lumbar spine. Lower abdominal wall mesh hernia repair anchors noted. IMPRESSION: Diffuse air-filled distention of the large bowel, possibly chronic large bowel dysmotility or ileus. Electronically Signed   By: Kristine Garbe M.D.   On: 05/02/2018 20:39   Dg Chest Port 1 View  Result Date: 05/02/2018 CLINICAL DATA:  Fever EXAM: PORTABLE CHEST 1 VIEW COMPARISON:  03/03/2018 FINDINGS: The heart size and mediastinal contours are within normal limits. Both lungs are clear. The visualized skeletal structures are unremarkable. IMPRESSION: No active disease. Electronically Signed   By: Franchot Gallo M.D.   On: 05/02/2018 14:35    A/P: TREASURE INGRUM is an 67 y.o. male with HTN, TBI, central cord syndrome, neurogenic bladder, UTI + pneumonia dx 3d ago - admitted for treatment of this - found to have gaseous distention of colon - cecum at 9 cm.   -No acute surgical intervention planned at this juncture; its possible he has underling Ogilvie's on top of likely chronic constipation - particularly in setting of active UTI and pneumonia; could have mechanical component (I.e. Mass) although none was seen on CT; would rule out prior to administering any neostigmine; would recommend GI consultation for further evaluation and treatment -For time being, would make NPO, MIVF -Keep electrolytes replaced - K+4, PO4 3, Mag 2 -Avoid narcotics  Sharon Mt. Dema Severin, M.D. [facility]

## 2018-05-03 NOTE — Progress Notes (Signed)
PROGRESS NOTE    Wayne Buckley  ZOX:[id number] DOB: Jun 30, 1950 DOA: 05/02/2018 PCP: [name], MD   Brief Narrative: Wayne Buckley is a 67 y.o. male with medical history significant of traumatic brain injury, central cord syndrome, neurogenic bladder with indwelling Foley catheter, bilateral leg weakness, prior DVT, hypertension, alcohol abuse. He presented secondary to fever. He was found to have MRSA in his urine prior to arrival. Blood cultures positive for MRSA and CT abdomen concerning for ileus.   Assessment & Plan:   Principal Problem:   Sepsis (HCC) Active Problems:   Alcohol abuse   AKI (acute kidney injury) (HCC)   Essential hypertension   Bowel obstruction (HCC)   Chronic anemia   Hyponatremia   Sacral ulcer (HCC)   History of DVT (deep vein thrombosis)   Traumatic brain injury (HCC)   Sepsis Secondary to UTI and MRSA bacteremia. Physiology improved.  MRSA bacteremia MRSA UTI Patient empirically treated with Vancomycin and cefepime. BCID positive for MRSA. Full culture results pending. -Continue Vancomycin -ID automatically consulted  Acute kidney injury Improving. -D5 1/2 NS fluids  Ileus General surgery has evaluated. Questioning possible mechanical component vs Ogilvie syndrome. Abdomen is significantly distended. Patient has had a bowel movement today with flatus. -NPO -GI consult  Chronic anemia Likely chronic anemia. Slight drop in setting of fluid resuscitation. No evidence of bleeding. -Repeat CBC  History of DVT Patient is on Eliquis as an outpatient. Currently NPO. -Hold Eliquis and start Heparin while NPO  History of TBI -PT eval: SNF  Essential hypertension Normotensive. -Continue to hold home antihypertensives  History of alcohol abuse Currently on CIWA  Pressure Injury Documentation: Pressure Injury 05/03/18 Deep Tissue Injury - Purple or maroon localized area of discolored intact skin or blood-filled blister due to  damage of underlying soft tissue from pressure and/or shear. (Active)  05/03/18    Location: Buttocks  Location Orientation: Left;Right  Staging: Deep Tissue Injury - Purple or maroon localized area of discolored intact skin or blood-filled blister due to damage of underlying soft tissue from pressure and/or shear.  Wound Description (Comments):   Present on Admission: Yes     DVT prophylaxis: Heparin drip Code Status:   Code Status: Full Code Family Communication: [name] Disposition Plan: Discharge to SNF in several days   Consultants:   General surgery  Infectious disease  Gastroenterology  Procedures:   None  Antimicrobials:  Vancomycin (11/14>>  Cefepime (11/14)    Subjective: Wants to sleep.  Objective: Vitals:   05/02/18 2000 05/02/18 2215 05/03/18 0408 05/03/18 0732  BP: 111/63 104/72 117/68 109/66  Pulse: 89 93 89 85  Resp: (!) 25 18 17 20   Temp:  99.1 F (37.3 C) (!) 100.4 F (38 C) 99.6 F (37.6 C)  TempSrc:  Oral Oral Oral  SpO2: 94% 97% 94% 92%  Weight:  91.2 kg      Intake/Output Summary (Last 24 hours) at 05/03/2018 1357 Last data filed at 05/03/2018 1203 Gross per 24 hour  Intake 100 ml  Output 1800 ml  Net -1700 ml   Filed Weights   05/02/18 1413 05/02/18 2215  Weight: 90.7 kg 91.2 kg    Examination:  General exam: Appears calm and comfortable Respiratory system: Clear to auscultation. Respiratory effort normal. Cardiovascular system: S1 & S2 heard, RRR. No murmurs, rubs, gallops or clicks. Gastrointestinal system: Abdomen is significantly distended, soft and nontender. Decreased bowel sounds heard. Central nervous system: Alert and oriented. Extremities: No edema. No  calf tenderness Skin: No cyanosis. No rashes Psychiatry: Judgement and insight appear normal. Mood & affect appropriate.     Data Reviewed: I have personally reviewed following labs and imaging studies  CBC: Recent Labs  Lab 05/02/18 1402  05/03/18 0533  WBC 7.8 7.3  NEUTROABS 6.5  --   HGB 9.7* 8.9*  HCT 30.2* 28.1*  MCV 88.6 87.0  PLT 253 256   Basic Metabolic Panel: Recent Labs  Lab 05/02/18 1402 05/03/18 0533  NA 131* 134*  K 3.4* 3.3*  CL 102 104  CO2 19* 21*  GLUCOSE 119* 110*  BUN 82* 68*  CREATININE 1.67* 1.16  CALCIUM 8.2* 8.4*  MG  --  2.2  PHOS  --  2.9   GFR: Estimated Creatinine Clearance: 70.4 mL/min (by C-G formula based on SCr of 1.16 mg/dL). Liver Function Tests: Recent Labs  Lab 05/02/18 1402  AST 83*  ALT 36  ALKPHOS 67  BILITOT 0.5  PROT 6.1*  ALBUMIN 2.3*   No results for input(s): LIPASE, AMYLASE in the last 168 hours. No results for input(s): AMMONIA in the last 168 hours. Coagulation Profile: No results for input(s): INR, PROTIME in the last 168 hours. Cardiac Enzymes: No results for input(s): CKTOTAL, CKMB, CKMBINDEX, TROPONINI in the last 168 hours. BNP (last 3 results) No results for input(s): PROBNP in the last 8760 hours. HbA1C: No results for input(s): HGBA1C in the last 72 hours. CBG: Recent Labs  Lab 05/03/18 0823  GLUCAP 97   Lipid Profile: No results for input(s): CHOL, HDL, LDLCALC, TRIG, CHOLHDL, LDLDIRECT in the last 72 hours. Thyroid Function Tests: No results for input(s): TSH, T4TOTAL, FREET4, T3FREE, THYROIDAB in the last 72 hours. Anemia Panel: No results for input(s): VITAMINB12, FOLATE, FERRITIN, TIBC, IRON, RETICCTPCT in the last 72 hours. Sepsis Labs: Recent Labs  Lab 05/02/18 1412 05/02/18 1644 05/02/18 2344  PROCALCITON  --   --  6.29  LATICACIDVEN 1.13 1.66  --     Recent Results (from the past 240 hour(s))  Blood Culture (routine x 2)     Status: None (Preliminary result)   Collection Time: 05/02/18  1:46 PM  Result Value Ref Range Status   Specimen Description BLOOD RIGHT ANTECUBITAL  Final   Special Requests   Final    BOTTLES DRAWN AEROBIC AND ANAEROBIC Blood Culture adequate volume   Culture  Setup Time   Final    GRAM  POSITIVE COCCI IN CLUSTERS IN BOTH AEROBIC AND ANAEROBIC BOTTLES CRITICAL VALUE NOTED.  VALUE IS CONSISTENT WITH PREVIOUSLY REPORTED AND CALLED VALUE. Performed at [facility], [address] [place], [place] [address]    Culture GRAM POSITIVE COCCI  Final   Report Status PENDING  Incomplete  Blood Culture (routine x 2)     Status: None (Preliminary result)   Collection Time: 05/02/18  2:03 PM  Result Value Ref Range Status   Specimen Description BLOOD LEFT ANTECUBITAL  Final   Special Requests   Final    BOTTLES DRAWN AEROBIC AND ANAEROBIC Blood Culture adequate volume   Culture  Setup Time   Final    GRAM POSITIVE COCCI IN CLUSTERS IN BOTH AEROBIC AND ANAEROBIC BOTTLES Organism ID to follow CRITICAL RESULT CALLED TO, READ BACK BY AND VERIFIED WITH: Gay Filler PharmD 10:05 05/03/18 ([email or web address]) Performed at [facility], [address] [place], [place] [address]    Culture [facility] POSITIVE COCCI  Final   Report Status PENDING  Incomplete  Blood Culture  ID Panel (Reflexed)     Status: Abnormal   Collection Time: 05/02/18  2:03 PM  Result Value Ref Range Status   Enterococcus species NOT DETECTED NOT DETECTED Final   Listeria monocytogenes NOT DETECTED NOT DETECTED Final   Staphylococcus species DETECTED (A) NOT DETECTED Final    Comment: CRITICAL RESULT CALLED TO, READ BACK BY AND VERIFIED WITH: Gay Filler PharmD 10:05 05/03/18 ([email or web address])    Staphylococcus aureus (BCID) DETECTED (A) NOT DETECTED Final    Comment: Methicillin (oxacillin)-resistant Staphylococcus aureus (MRSA). MRSA is predictably resistant to beta-lactam antibiotics (except ceftaroline). Preferred therapy is vancomycin unless clinically contraindicated. Patient requires contact precautions if  hospitalized. CRITICAL RESULT CALLED TO, READ BACK BY AND VERIFIED WITH: Gay Filler PharmD 10:05 05/03/18 ([email or web address])    Methicillin resistance DETECTED (A) NOT DETECTED Final    Comment: CRITICAL RESULT CALLED TO, READ BACK  BY AND VERIFIED WITH: Gay Filler PharmD 10:05 05/03/18 ([email or web address])    Streptococcus species NOT DETECTED NOT DETECTED Final   Streptococcus agalactiae NOT DETECTED NOT DETECTED Final   Streptococcus pneumoniae NOT DETECTED NOT DETECTED Final   Streptococcus pyogenes NOT DETECTED NOT DETECTED Final   Acinetobacter baumannii NOT DETECTED NOT DETECTED Final   Enterobacteriaceae species NOT DETECTED NOT DETECTED Final   Enterobacter cloacae complex NOT DETECTED NOT DETECTED Final   Escherichia coli NOT DETECTED NOT DETECTED Final   Klebsiella oxytoca NOT DETECTED NOT DETECTED Final   Klebsiella pneumoniae NOT DETECTED NOT DETECTED Final   Proteus species NOT DETECTED NOT DETECTED Final   Serratia marcescens NOT DETECTED NOT DETECTED Final   Haemophilus influenzae NOT DETECTED NOT DETECTED Final   Neisseria meningitidis NOT DETECTED NOT DETECTED Final   Pseudomonas aeruginosa NOT DETECTED NOT DETECTED Final   Candida albicans NOT DETECTED NOT DETECTED Final   Candida glabrata NOT DETECTED NOT DETECTED Final   Candida krusei NOT DETECTED NOT DETECTED Final   Candida parapsilosis NOT DETECTED NOT DETECTED Final   Candida tropicalis NOT DETECTED NOT DETECTED Final    Comment: Performed at [facility], [address], [place], [place] [address]  MRSA PCR Screening     Status: Abnormal   Collection Time: 05/03/18  6:12 AM  Result Value Ref Range Status   MRSA by PCR POSITIVE (A) NEGATIVE Final    Comment:        The GeneXpert MRSA Assay (FDA approved for NASAL specimens only), is one component of a comprehensive MRSA colonization surveillance program. It is not intended to diagnose MRSA infection nor to guide or monitor treatment for MRSA infections. RESULT CALLED TO, READ BACK BY AND VERIFIED WITH: Lyla Glassing RN 9:40 05/03/18 ([email or web address]) Performed at [facility], [address] [place], [place] [address]          Radiology Studies: Ct Abdomen Pelvis Wo  Contrast  Result Date: 05/02/2018 CLINICAL DATA:  Abdominal distention EXAM: CT ABDOMEN AND PELVIS WITHOUT CONTRAST TECHNIQUE: Multidetector CT imaging of the abdomen and pelvis was performed following the standard protocol without IV contrast. COMPARISON:  Same day radiographs of the abdomen and pelvis FINDINGS: Lower chest: Top-normal heart size. Trace pleural effusions with bibasilar atelectasis. Hepatobiliary: No focal liver abnormality is seen. No gallstones, gallbladder wall thickening, or biliary dilatation. Pancreas: Unremarkable. No pancreatic ductal dilatation or surrounding inflammatory changes. Spleen: Normal in size without focal abnormality. Adrenals/Urinary Tract: Normal bilateral adrenal glands. No nephrolithiasis. Motion artifacts limit assessment at the level of the kidneys however there does appear to be a hypodense cystic  lesion of the interpolar left kidney measuring up to 6 cm. No nephrolithiasis nor hydroureteronephrosis. Foley decompressed urinary bladder. Stomach/Bowel: Small hiatal hernia. Decompressed stomach and small bowel loops. Normal small bowel rotation. Moderate gaseous distention of the colon up to 9 cm at the level of the cecum with liquid stool noted within from cecum to distal descending colon likely representing large bowel dysmotility or ileus. Circumferential thickening surrounding a small stool ball is noted within the rectum. Mild stercoral proctitis is not excluded. Vascular/Lymphatic: IVC filter in place. Atherosclerosis of the aorta without aneurysm. No adenopathy. Reproductive: Normal size prostate. Other: Soft tissue anasarca. No free air or free fluid. Musculoskeletal: Status post ventral hernia repair. Small amount of fat in the right inguinal canal. IMPRESSION: 1. Moderate gaseous distention of the colon up to 9 cm at the level of the cecum with liquid stool noted within from cecum to distal descending colon. Findings may represent a large bowel dysmotility or  ileus. Mechanical source of obstruction is identified. 2. Circumferential thickening surrounding a small stool ball within the rectum. Mild stercoral proctitis could have this appearance. 3. 6 cm hypodense cystic lesion of the interpolar left kidney, likely a simple cyst however further characterization is limited due to motion. 4. IVC filter in place. 5. Mild soft tissue anasarca. Electronically Signed   By: Tollie Ethavid  Kwon M.D.   On: 05/02/2018 16:46   Dg Abdomen 1 View  Result Date: 05/02/2018 CLINICAL DATA:  67 y/o  M; abdominal distention. EXAM: ABDOMEN - 1 VIEW COMPARISON:  05/02/2018 CT abdomen and pelvis. FINDINGS: Diffuse air-filled distention of the large bowel. Upper abdomen is excluded from the field of view. IVC filter in situ. Advanced degenerative changes of the visible lumbar spine. Lower abdominal wall mesh hernia repair anchors noted. IMPRESSION: Diffuse air-filled distention of the large bowel, possibly chronic large bowel dysmotility or ileus. Electronically Signed   By: Mitzi HansenLance  Furusawa-Stratton M.D.   On: 05/02/2018 20:39   Dg Chest Port 1 View  Result Date: 05/02/2018 CLINICAL DATA:  Fever EXAM: PORTABLE CHEST 1 VIEW COMPARISON:  03/03/2018 FINDINGS: The heart size and mediastinal contours are within normal limits. Both lungs are clear. The visualized skeletal structures are unremarkable. IMPRESSION: No active disease. Electronically Signed   By: Marlan Palauharles  Clark M.D.   On: 05/02/2018 14:35        Scheduled Meds: . apixaban  5 mg Oral BID  . folic acid  1 mg Oral Daily  . liver oil-zinc oxide   Topical QID  . multivitamin with minerals  1 tablet Oral Daily  . thiamine  100 mg Oral Daily   Or  . thiamine  100 mg Intravenous Daily   Continuous Infusions: . vancomycin 750 mg (05/03/18 0339)     LOS: 1 day     Jacquelin Hawkingalph Boe Deans, MD [facility] 05/03/2018, 1:57 PM  If 7PM-7AM, please contact night-coverage [email or web address]

## 2018-05-03 NOTE — Consult Note (Signed)
[facility]    Date of Admission:  05/02/2018     Total days of antibiotics 2               Reason for Consult: MRSA Bacteremia   Referring Provider: Candy Sledgehamp / Auto-consult Primary Care Provider: Daisy Florooss, Charles Alan, MD   Assessment/Plan:  [name] is a 67 year old male with previous history of traumatic brain injury and resident of skilled nursing facility who was admitted for evaluation of fever and concern for UTI. Found to have MRSA bacteremia on blood cultures. Source of the infection remains unclear at present although initial concern for urinary source. He does have chronic indwelling catheter which will need to be changed. Sacral area evaluated by WOC RN with no obvious infection. Will need TTE to rule out endocarditis. Current antimicrobial therapy has been narrowed to vancomycin. HIV screen negative.   1. Agree with change to vancomycin and discontinuation of cefepime.  2. Repeat blood cultures tonight.  3. TTE order placed. 4. Monitor cultures, WBC count, and fevers.  5. Check Hepatitis C antibody for general health maintenance.         [facility] [place] [facility] Staphylococcus aureus bacteremia   Staphylococcus aureus bacteremia (SAB) is associated with a high rate of complications and mortality.  Specific aspects of clinical management are critical to optimizing the outcome of patients with SAB.  Therefore, the [facility]) has initiated an intervention aimed at improving the management of SAB at [facility].  To do so, Infectious Diseases physicians are providing an evidence-based consult for the management of all patients with SAB.     Yes No Comments  Perform follow-up blood cultures (even if the patient is afebrile) to ensure clearance of bacteremia [x]  []  Scheduled  Remove vascular catheter and obtain follow-up blood cultures after the removal of the catheter []  [x]  None present    Perform echocardiography to evaluate for endocarditis (transthoracic ECHO is 40-50% sensitive, TEE is > 90% sensitive) [x]  []  TTE ordered and pending  Consult electrophysiologist to evaluate implanted cardiac device (pacemaker, ICD) []  [x]  None present  Ensure source control [x]  []  No other sources of pain or infection at present  Investigate for "metastatic" sites of infection [x]  []  Does the patient have ANY symptom or physical exam finding that would suggest a deeper infection (back or neck pain that may be suggestive of vertebral osteomyelitis or epidural abscess, muscle pain that could be a symptom of pyomyositis)?  Keep in mind that for deep seeded infections MRI imaging with contrast is preferred rather than other often insensitive tests such as plain x-rays, especially early in a patient's presentation.  Change antibiotic therapy to __________________ [x]  []  Changed to vancomycin   Estimated duration of IV antibiotic therapy:   []  []  Will need at least 4-6 weeks of IV therapy depending on further work up.     Principal Problem:   Sepsis (HCC) Active Problems:   Alcohol abuse   AKI (acute kidney injury) (HCC)   Essential hypertension   Bowel obstruction (HCC)   Chronic anemia   Hyponatremia   Sacral ulcer (HCC)   History of DVT (deep vein thrombosis)   Traumatic brain injury (HCC)   . folic acid  1 mg Oral Daily  . liver oil-zinc oxide   Topical QID  . multivitamin with minerals  1 tablet Oral Daily  . thiamine  100 mg Oral Daily   Or  .  thiamine  100 mg Intravenous Daily     HPI: Wayne Buckley is a 67 y.o. male with previous medical history of traumatic brain injury, central cord syndrome, neurogenic bladder with indwelling Foley catheter, prior DVT, and alcohol abuse presenting to the ED for evaluation of fever.  History was primarily provided by his wife who is at the bedside in the emergency room.  He has been living at a skilled nursing facility since October following a  fall and approximately 5 days ago fell out of his wheelchair at the nursing home.  Starting 3 days ago noted to have a low-grade fever and diagnosed with pneumonia and possible UTI.  Prescribed Macrobid and given a one-time dose of Rocephin.  He continued to be febrile despite receiving antibiotics.  In the ED found to have a temperature of 103 degrees and hypotensive at 82/54.  He responded well to fluid resuscitation.  UA was without evidence of UTI.  Chest x-ray with no active pneumonia or disease.  CT of the abdomen and pelvis with concern for large bowel dysmotility or or ileus and a circumferential thickening surrounding a small stool ball within the rectum.  He was started on vancomycin and cefepime in the emergency department.  General surgery was consulted with no indication for surgery at this time.  Gastroenterology consultation recommended.  Mildly elevated temperature of 100.4 overnight with no leukocytosis and most recent white blood cell count of 7.3.  Antimicrobial therapy consisted of cefepime and vancomycin until blood cultures were positive for MRSA bacteremia.  Antibiotics were narrowed down to vancomycin.  Review of Systems: ROS   Past Medical History:  Diagnosis Date  . Hypertension   . TBI (traumatic brain injury) (HCC) 2013    Social History   Tobacco Use  . Smoking status: Never Smoker  . Smokeless tobacco: Never Used  Substance Use Topics  . Alcohol use: Yes    Comment: daily   . Drug use: Not on file    Family History  Problem Relation Age of Onset  . Heart disease Father     No Known Allergies  OBJECTIVE: Blood pressure 109/66, pulse 85, temperature 99.6 F (37.6 C), temperature source Oral, resp. rate 20, weight 91.2 kg, SpO2 92 %.  Physical Exam  Lab Results Lab Results  Component Value Date   WBC 7.3 05/03/2018   HGB 8.9 (L) 05/03/2018   HCT 28.1 (L) 05/03/2018   MCV 87.0 05/03/2018   PLT 256 05/03/2018    Lab Results  Component Value  Date   CREATININE 1.16 05/03/2018   BUN 68 (H) 05/03/2018   NA 134 (L) 05/03/2018   K 3.3 (L) 05/03/2018   CL 104 05/03/2018   CO2 21 (L) 05/03/2018    Lab Results  Component Value Date   ALT 36 05/02/2018   AST 83 (H) 05/02/2018   ALKPHOS 67 05/02/2018   BILITOT 0.5 05/02/2018     Microbiology: Recent Results (from the past 240 hour(s))  Blood Culture (routine x 2)     Status: None (Preliminary result)   Collection Time: 05/02/18  1:46 PM  Result Value Ref Range Status   Specimen Description BLOOD RIGHT ANTECUBITAL  Final   Special Requests   Final    BOTTLES DRAWN AEROBIC AND ANAEROBIC Blood Culture adequate volume   Culture  Setup Time   Final    GRAM POSITIVE COCCI IN CLUSTERS IN BOTH AEROBIC AND ANAEROBIC BOTTLES CRITICAL VALUE NOTED.  VALUE IS CONSISTENT WITH PREVIOUSLY REPORTED  AND CALLED VALUE. Performed at [facility], [address], [place], [place] [address]    Culture GRAM POSITIVE COCCI  Final   Report Status PENDING  Incomplete  Blood Culture (routine x 2)     Status: None (Preliminary result)   Collection Time: 05/02/18  2:03 PM  Result Value Ref Range Status   Specimen Description BLOOD LEFT ANTECUBITAL  Final   Special Requests   Final    BOTTLES DRAWN AEROBIC AND ANAEROBIC Blood Culture adequate volume   Culture  Setup Time   Final    GRAM POSITIVE COCCI IN CLUSTERS IN BOTH AEROBIC AND ANAEROBIC BOTTLES Organism ID to follow CRITICAL RESULT CALLED TO, READ BACK BY AND VERIFIED WITH: Gay Filler PharmD 10:05 05/03/18 ([email or web address]) Performed at [facility], [address] [place], [place] [address]    Culture GRAM POSITIVE COCCI  Final   Report Status PENDING  Incomplete  Blood Culture ID Panel (Reflexed)     Status: Abnormal   Collection Time: 05/02/18  2:03 PM  Result Value Ref Range Status   Enterococcus species NOT DETECTED NOT DETECTED Final   Listeria monocytogenes NOT DETECTED NOT DETECTED Final   Staphylococcus species DETECTED (A)  NOT DETECTED Final    Comment: CRITICAL RESULT CALLED TO, READ BACK BY AND VERIFIED WITH: Gay Filler PharmD 10:05 05/03/18 ([email or web address])    Staphylococcus aureus (BCID) DETECTED (A) NOT DETECTED Final    Comment: Methicillin (oxacillin)-resistant Staphylococcus aureus (MRSA). MRSA is predictably resistant to beta-lactam antibiotics (except ceftaroline). Preferred therapy is vancomycin unless clinically contraindicated. Patient requires contact precautions if  hospitalized. CRITICAL RESULT CALLED TO, READ BACK BY AND VERIFIED WITH: Gay Filler PharmD 10:05 05/03/18 ([email or web address])    Methicillin resistance DETECTED (A) NOT DETECTED Final    Comment: CRITICAL RESULT CALLED TO, READ BACK BY AND VERIFIED WITH: Gay Filler PharmD 10:05 05/03/18 ([email or web address])    Streptococcus species NOT DETECTED NOT DETECTED Final   Streptococcus agalactiae NOT DETECTED NOT DETECTED Final   Streptococcus pneumoniae NOT DETECTED NOT DETECTED Final   Streptococcus pyogenes NOT DETECTED NOT DETECTED Final   Acinetobacter baumannii NOT DETECTED NOT DETECTED Final   Enterobacteriaceae species NOT DETECTED NOT DETECTED Final   Enterobacter cloacae complex NOT DETECTED NOT DETECTED Final   Escherichia coli NOT DETECTED NOT DETECTED Final   Klebsiella oxytoca NOT DETECTED NOT DETECTED Final   Klebsiella pneumoniae NOT DETECTED NOT DETECTED Final   Proteus species NOT DETECTED NOT DETECTED Final   Serratia marcescens NOT DETECTED NOT DETECTED Final   Haemophilus influenzae NOT DETECTED NOT DETECTED Final   Neisseria meningitidis NOT DETECTED NOT DETECTED Final   Pseudomonas aeruginosa NOT DETECTED NOT DETECTED Final   Candida albicans NOT DETECTED NOT DETECTED Final   Candida glabrata NOT DETECTED NOT DETECTED Final   Candida krusei NOT DETECTED NOT DETECTED Final   Candida parapsilosis NOT DETECTED NOT DETECTED Final   Candida tropicalis NOT DETECTED NOT DETECTED Final    Comment: Performed at [facility], [address]  [place], [place] [address]  MRSA PCR Screening     Status: Abnormal   Collection Time: 05/03/18  6:12 AM  Result Value Ref Range Status   MRSA by PCR POSITIVE (A) NEGATIVE Final    Comment:        The GeneXpert MRSA Assay (FDA approved for NASAL specimens only), is one component of a comprehensive MRSA colonization surveillance program. It is not intended to diagnose MRSA infection nor to guide or monitor  treatment for MRSA infections. RESULT CALLED TO, READ BACK BY AND VERIFIED WITH: Lyla Glassing RN 9:40 05/03/18 ([email or web address]) Performed at [facility], [address], [place], [place] [address]      Marcos Eke, NP [facility] [phone] Pager  05/03/2018  11:22 AM

## 2018-05-03 NOTE — Evaluation (Signed)
Physical Therapy Evaluation Patient Details Name: Wayne Buckley M Dengel MRN: [id number] DOB: 09/19/1950 Today's Date: 05/03/2018   History of Present Illness  Pt is a 67 y/o male with a PMH signficant for TBI, central cord syndrome, neurogenic bladder with indwelling foley catheter, BLE weakness, prior DVT, HTN, alcohol abuse presenting to the hospital for evaluation of fever. In ED, urine culture grew MRSA and CT abdomen revealed possible large bowel dysmotility or ileus.   Clinical Impression  Pt admitted with above diagnosis. Pt currently with functional limitations due to the deficits listed below (see PT Problem List). At the time of PT eval, pt became more agitated with attempts at mobility. He adamantly refused to attempt OOB or EOB and bed level assessment was limited. PLOF is unclear at this time. Based on limited amount of information I was able to gather during session, feel pt is appropriate to return to SNF at d/c. Acutely, pt will benefit from skilled PT to increase their independence and safety with mobility to allow discharge to the venue listed below.      Follow Up Recommendations SNF;Supervision/Assistance - 24 hour    Equipment Recommendations  None recommended by PT    Recommendations for Other Services OT consult     Precautions / Restrictions Precautions Precautions: Fall Restrictions Weight Bearing Restrictions: No      Mobility  Bed Mobility                  Transfers                    Ambulation/Gait                Stairs            Wheelchair Mobility    Modified Rankin (Stroke Patients Only)       Balance                                             Pertinent Vitals/Pain Pain Assessment: Faces Faces Pain Scale: Hurts little more Pain Location: LLE with therapist moving it to assess. Pain Descriptors / Indicators: Other (Comment)(appeared sharp - pt unable to localize or describe) Pain  Intervention(s): Monitored during session    Home Living Family/patient expects to be discharged to:: Skilled nursing facility                 Additional Comments: [facility].     Prior Function           Comments: Unsure of PLOF since prior fall in October. One of the last therapy notes from this year states staff using Stedy to get OOB to wheelchair.     Hand Dominance        Extremity/Trunk Assessment   Upper Extremity Assessment Upper Extremity Assessment: Defer to OT evaluation    Lower Extremity Assessment Lower Extremity Assessment: Generalized weakness(Limited bed level assessment)    Cervical / Trunk Assessment Cervical / Trunk Assessment: (Unable to be assessed )  Communication   Communication: Expressive difficulties  Cognition Arousal/Alertness: Awake/alert Behavior During Therapy: Agitated;Restless Overall Cognitive Status: History of cognitive impairments - at baseline  General Comments: Wife not present during session. Pt alert and oriented x3. Could not tell me why he was in the hospital but knew he was at [facility]. Pt restless in bed but got agitated quickly when PT entered and suggested OOB. Pt adamantly refusing to get up.       General Comments      Exercises     Assessment/Plan    PT Assessment Patient needs continued PT services  PT Problem List Decreased strength;Decreased range of motion;Decreased activity tolerance;Decreased balance;Decreased mobility;Decreased knowledge of use of DME;Decreased safety awareness;Decreased knowledge of precautions;Decreased coordination;Decreased cognition       PT Treatment Interventions DME instruction;Functional mobility training;Therapeutic activities;Therapeutic exercise;Neuromuscular re-education;Patient/family education;Gait training;Wheelchair mobility training    PT Goals (Current goals can be  found in the Care Plan section)  Acute Rehab PT Goals Patient Stated Goal: To be left alone PT Goal Formulation: Patient unable to participate in goal setting Time For Goal Achievement: 05/17/18 Potential to Achieve Goals: Good    Frequency Min 2X/week   Barriers to discharge        Co-evaluation               AM-PAC PT "6 Clicks" Daily Activity  Outcome Measure Difficulty turning over in bed (including adjusting bedclothes, sheets and blankets)?: Unable Difficulty moving from lying on back to sitting on the side of the bed? : Unable Difficulty sitting down on and standing up from a chair with arms (e.g., wheelchair, bedside commode, etc,.)?: Unable Help needed moving to and from a bed to chair (including a wheelchair)?: Total Help needed walking in hospital room?: Total Help needed climbing 3-5 steps with a railing? : Total 6 Click Score: 6    End of Session     Patient left: in bed;with call bell/phone within reach Nurse Communication: Mobility status;Need for lift equipment PT Visit Diagnosis: History of falling (Z91.81)    Time: 1610-9604 PT Time Calculation (min) (ACUTE ONLY): 13 min   Charges:   PT Evaluation $PT Eval High Complexity: 1 High          Conni Slipper, PT, DPT Acute Rehabilitation Services Pager: [phone] Office: [phone]   Marylynn Pearson 05/03/2018, 1:13 PM

## 2018-05-03 NOTE — Progress Notes (Signed)
ANTICOAGULATION CONSULT NOTE - Initial Consult  Pharmacy Consult for heparin Indication: h/o DVT  No Known Allergies  Patient Measurements: Weight: 201 lb 1 oz (91.2 kg) Heparin Dosing Weight: 91  Vital Signs: Temp: 99.6 F (37.6 C) (11/15 0732) Temp Source: Oral (11/15 0732) BP: 109/66 (11/15 0732) Pulse Rate: 85 (11/15 0732)  Labs: Recent Labs    05/02/18 1402 05/03/18 0533  HGB 9.7* 8.9*  HCT 30.2* 28.1*  PLT 253 256  CREATININE 1.67* 1.16    Estimated Creatinine Clearance: 70.4 mL/min (by C-G formula based on SCr of 1.16 mg/dL).   Medical History: Past Medical History:  Diagnosis Date  . Hypertension   . TBI (traumatic brain injury) (HCC) 2013    Assessment: 67 yo male with h/o DVT on apixaban PTA. Last known dose of apixaban was 11/14 at 1100. MD would like to start a heparin drip for therapeutic anticoagulation while apixaban is on hold in the setting of ileus. Since patient has been on apixaban, we will use APTT initially to titrate heparin due to apixaban's effect on heparin levels.   Goal of Therapy:  Heparin level 0.3-0.7 units/ml  APTT 66-102 sec Monitor platelets by anticoagulation protocol: Yes   Plan:  Heparin drip at 1250 units/hr, no bolus APTT and Heparin level at 6 hours Daily heparin level and CBC Monitor for bleeding  Riaan Toledo A. Jeanella CrazePierce, PharmD, BCPS Clinical Pharmacist  Pager: [phone] Please utilize Amion for appropriate phone number to reach the unit pharmacist [facility]   05/03/2018,3:30 PM

## 2018-05-03 NOTE — Progress Notes (Addendum)
PHARMACY - PHYSICIAN COMMUNICATION CRITICAL VALUE ALERT - BLOOD CULTURE IDENTIFICATION (BCID)  Wayne Buckley is an 67 y.o. male who [facility] with a chief complaint of fever  Assessment:  [name] presented fron SNF with fever. He was started on macrobid and ceftriaxone there for his UTI. Apparently, his urine culture grew MRSA due to the chronic indwelling cath. He has a neurogenic bladder. He was started on vanc/cefepime here as a sepsis protocol. Lab called with the BCID result>>3/4 bottles positive for MRSA. Recommend dc cefepime. ID will be consulted for this pt.   Name of physician (or Provider) Contacted: Dr. Caleb PoppNettey  Current antibiotics: Vanc/cefepime  Changes to prescribed antibiotics recommended:  Dc cefepime  Results for orders placed or performed during the hospital encounter of 05/02/18  Blood Culture ID Panel (Reflexed) (Collected: 05/02/2018  2:03 PM)  Result Value Ref Range   Enterococcus species NOT DETECTED NOT DETECTED   Listeria monocytogenes NOT DETECTED NOT DETECTED   Staphylococcus species DETECTED (A) NOT DETECTED   Staphylococcus aureus (BCID) DETECTED (A) NOT DETECTED   Methicillin resistance DETECTED (A) NOT DETECTED   Streptococcus species NOT DETECTED NOT DETECTED   Streptococcus agalactiae NOT DETECTED NOT DETECTED   Streptococcus pneumoniae NOT DETECTED NOT DETECTED   Streptococcus pyogenes NOT DETECTED NOT DETECTED   Acinetobacter baumannii NOT DETECTED NOT DETECTED   Enterobacteriaceae species NOT DETECTED NOT DETECTED   Enterobacter cloacae complex NOT DETECTED NOT DETECTED   Escherichia coli NOT DETECTED NOT DETECTED   Klebsiella oxytoca NOT DETECTED NOT DETECTED   Klebsiella pneumoniae NOT DETECTED NOT DETECTED   Proteus species NOT DETECTED NOT DETECTED   Serratia marcescens NOT DETECTED NOT DETECTED   Haemophilus influenzae NOT DETECTED NOT DETECTED   Neisseria meningitidis NOT DETECTED NOT DETECTED   Pseudomonas  aeruginosa NOT DETECTED NOT DETECTED   Candida albicans NOT DETECTED NOT DETECTED   Candida glabrata NOT DETECTED NOT DETECTED   Candida krusei NOT DETECTED NOT DETECTED   Candida parapsilosis NOT DETECTED NOT DETECTED   Candida tropicalis NOT DETECTED NOT DETECTED    Ulyses SouthwardMinh Pham, PharmD, BCIDP, AAHIVP, CPP Infectious Disease Pharmacist 05/03/2018 10:22 AM

## 2018-05-03 NOTE — Consult Note (Signed)
WOC Nurse wound consult note. [facility] Spouse present throughout evaluation.  Reason for Consult: Sacrum  Wound type: DTPI. Spouse stated the patient is on blood thinners and he slipped out of his wheelchair at the nursing home shortly before his admission.  Pressure Injury POA: Yes Measurement: From the coccyx to the perianal area the patient has purple-maroon discoloration. Left buttock to coccyx measures 8cm x 2.3cm and the overlying tissue is intact and there is no fluid filled area. The right buttock area measures 9cm x 4cm and has a blood filled pocket at the superior margin.  Periwound: Beyond the borders of the DTPI the skin is normal color and texture.  Dressing procedure/placement/frequency: 40% Desitin 4 times daily; air mattress with linen and incontinence brief instructions; turning instructions.  The wife viewed the area. I explained that with DTPIs we cannot predict how the body tissue will respond. There is a likelihood that the tissue may become slough, devitalized tissue and will need treatment changes in the future.  Monitor the wound area(s) for worsening of condition such as: Signs/symptoms of infection,  Increase in size,  Development of or worsening of odor, Development of pain, or increased pain at the affected locations.  Notify the medical team if any of these develop.  Thank you for the consult.  Discussed plan of care with the patient and bedside nurse.  WOC nurse will not follow at this time.  Please re-consult the WOC team if needed.  Helmut MusterSherry Joanmarie Tsang, RN, MSN, CWOCN, CNS-BC, pager [phone]

## 2018-05-04 ENCOUNTER — Inpatient Hospital Stay (HOSPITAL_COMMUNITY): Payer: Medicare Other

## 2018-05-04 ENCOUNTER — Encounter (HOSPITAL_COMMUNITY): Payer: Self-pay | Admitting: *Deleted

## 2018-05-04 DIAGNOSIS — A4102 Sepsis due to Methicillin resistant Staphylococcus aureus: Secondary | ICD-10-CM

## 2018-05-04 DIAGNOSIS — I339 Acute and subacute endocarditis, unspecified: Secondary | ICD-10-CM

## 2018-05-04 DIAGNOSIS — R6520 Severe sepsis without septic shock: Secondary | ICD-10-CM

## 2018-05-04 LAB — APTT
aPTT: 50 s — ABNORMAL HIGH (ref 24–36)
aPTT: 46 s — ABNORMAL HIGH (ref 24–36)
aPTT: 68 s — ABNORMAL HIGH (ref 24–36)

## 2018-05-04 LAB — CBC
HEMATOCRIT: 28 % — AB (ref 39.0–52.0)
HEMOGLOBIN: 9 g/dL — AB (ref 13.0–17.0)
MCH: 28 pg (ref 26.0–34.0)
MCHC: 32.1 g/dL (ref 30.0–36.0)
MCV: 87.2 fL (ref 80.0–100.0)
NRBC: 0 % (ref 0.0–0.2)
Platelets: 320 10*3/uL (ref 150–400)
RBC: 3.21 MIL/uL — AB (ref 4.22–5.81)
RDW: 14.6 % (ref 11.5–15.5)
WBC: 9.7 10*3/uL (ref 4.0–10.5)

## 2018-05-04 LAB — BASIC METABOLIC PANEL
ANION GAP: 6 (ref 5–15)
BUN: 46 mg/dL — ABNORMAL HIGH (ref 8–23)
CHLORIDE: 110 mmol/L (ref 98–111)
CO2: 20 mmol/L — AB (ref 22–32)
Calcium: 8.5 mg/dL — ABNORMAL LOW (ref 8.9–10.3)
Creatinine, Ser: 0.85 mg/dL (ref 0.61–1.24)
GFR calc Af Amer: >60 mL/min (ref >60)
GFR calc non Af Amer: >60 mL/min (ref >60)
Glucose, Bld: 128 mg/dL — ABNORMAL HIGH (ref 70–99)
Potassium: 3.3 mmol/L — ABNORMAL LOW (ref 3.5–5.1)
SODIUM: 136 mmol/L (ref 135–145)

## 2018-05-04 LAB — HEPARIN LEVEL (UNFRACTIONATED)
Heparin Unfractionated: >2.2 IU/mL — ABNORMAL HIGH (ref 0.30–0.70)
Heparin Unfractionated: 1.66 [IU]/mL — ABNORMAL HIGH (ref 0.30–0.70)

## 2018-05-04 LAB — ECHOCARDIOGRAM COMPLETE: Weight: 3216.95 oz

## 2018-05-04 MED ORDER — SIMETHICONE 40 MG/0.6ML PO SUSP
40.0000 mg | Freq: Four times a day (QID) | ORAL | Status: AC
  Administered 2018-05-04 – 2018-05-06 (×11): 40 mg via ORAL
  Filled 2018-05-04 (×12): qty 0.6

## 2018-05-04 MED ORDER — CHLORHEXIDINE GLUCONATE CLOTH 2 % EX PADS
6.0000 | MEDICATED_PAD | Freq: Every day | CUTANEOUS | Status: AC
  Administered 2018-05-04 – 2018-05-08 (×5): 6 via TOPICAL

## 2018-05-04 MED ORDER — HEPARIN BOLUS VIA INFUSION
1250.0000 [IU] | Freq: Once | INTRAVENOUS | Status: AC
  Administered 2018-05-04: 1250 [IU] via INTRAVENOUS
  Filled 2018-05-04: qty 1250

## 2018-05-04 MED ORDER — BISACODYL 10 MG RE SUPP
10.0000 mg | Freq: Every day | RECTAL | Status: DC
  Administered 2018-05-04 – 2018-05-21 (×12): 10 mg via RECTAL
  Filled 2018-05-04 (×17): qty 1

## 2018-05-04 MED ORDER — POTASSIUM CHLORIDE 10 MEQ/100ML IV SOLN
10.0000 meq | INTRAVENOUS | Status: AC
  Administered 2018-05-04 (×6): 10 meq via INTRAVENOUS
  Filled 2018-05-04 (×3): qty 100

## 2018-05-04 MED ORDER — MUPIROCIN 2 % EX OINT
1.0000 "application " | TOPICAL_OINTMENT | Freq: Two times a day (BID) | CUTANEOUS | Status: AC
  Administered 2018-05-04 – 2018-05-08 (×9): 1 via NASAL
  Filled 2018-05-04 (×2): qty 22

## 2018-05-04 MED ORDER — POTASSIUM CHLORIDE 10 MEQ/100ML IV SOLN: 10.0000 meq | INTRAVENOUS | Status: DC

## 2018-05-04 MED ORDER — PERFLUTREN LIPID MICROSPHERE
1.0000 mL | INTRAVENOUS | Status: AC | PRN
  Administered 2018-05-04: 4 mL via INTRAVENOUS
  Filled 2018-05-04: qty 10

## 2018-05-04 NOTE — Progress Notes (Addendum)
ANTICOAGULATION CONSULT NOTE   Pharmacy Consult for heparin Indication: h/o DVT  No Known Allergies  Patient Measurements: Weight: 201 lb 1 oz (91.2 kg) Heparin Dosing Weight: 91  Vital Signs: Temp: 99.5 F (37.5 C) (11/16 1735) Temp Source: Oral (11/16 1735) BP: 138/78 (11/16 1735) Pulse Rate: 91 (11/16 1735)  Labs: Recent Labs    05/02/18 1402 05/03/18 0533  05/03/18 2212 05/04/18 0555 05/04/18 1449 05/04/18 2211  HGB 9.7* 8.9*  --   --  9.0*  --   --   HCT 30.2* 28.1*  --   --  28.0*  --   --   PLT 253 256  --   --  320  --   --   APTT  --   --    < > 53* 50* 46* 68*  HEPARINUNFRC  --   --   --  >2.20*  --  1.66*  --   CREATININE 1.67* 1.16  --   --  0.85  --   --    < > = values in this interval not displayed.    Estimated Creatinine Clearance: 96.1 mL/min (by C-G formula based on SCr of 0.85 mg/dL).   Medical History: Past Medical History:  Diagnosis Date  . Hypertension   . TBI (traumatic brain injury) (HCC) 2013    Assessment: 67 yo male with h/o DVT on apixaban PTA. Last known dose of apixaban was 11/14 at 1100. MD would like to start a heparin drip for therapeutic anticoagulation while apixaban is on hold in the setting of ileus. Since patient has been on apixaban, we will use aPTT initially to titrate heparin due to apixaban's effect on heparin levels.    Aptt 68 sec  Goal of Therapy:  Heparin level 0.3-0.7 units/ml  APTT 66-102 sec Monitor platelets by anticoagulation protocol: Yes   Plan:  Continue Heparin drip at 1950 units/hr Daily heparin level, aPTT and CBC, Monitor for bleeding  Thanks for allowing pharmacy to be a part of this patient's care.  Talbert CageLora Nikola Blackston, PharmD Clinical Pharmacist 05/04/2018 11:08 PM   Addum:  APTT low this am.  RN states has had issues with IV overnight.  Will continue heparin at same arte.  Check labs later today

## 2018-05-04 NOTE — Progress Notes (Signed)
PROGRESS NOTE    Wayne Buckley  WUJ:[id number] DOB: [birth year] DOA: 05/02/2018 PCP: [name], MD   Brief Narrative: Wayne Buckley is a 67 y.o. male with medical history significant of traumatic brain injury, central cord syndrome, neurogenic bladder with indwelling Foley catheter, bilateral leg weakness, prior DVT, hypertension, alcohol abuse. He presented secondary to fever. He was found to have MRSA in his urine prior to arrival. Blood cultures positive for MRSA and CT abdomen concerning for ileus.   Assessment & Plan:   Principal Problem:   MRSA bacteremia Active Problems:   Alcohol abuse   AKI (acute kidney injury) (HCC)   Essential hypertension   Sepsis (HCC)   Bowel obstruction (HCC)   Chronic anemia   Hyponatremia   Sacral ulcer (HCC)   History of DVT (deep vein thrombosis)   Traumatic brain injury (HCC)   Sepsis Secondary to UTI and MRSA bacteremia. Physiology improved.  MRSA bacteremia MRSA UTI Patient empirically treated with Vancomycin and cefepime. BCID positive for MRSA. Full culture results pending. -Continue Vancomycin -ID recommendations: Repeat Bcx, Transthoracic Echocardiogram  Acute kidney injury Baseline creatinine of <1. Creatinine of 1.67 on admission. Resolved -D5 1/2 NS fluids  Ileus General surgery has evaluated. Questioning possible mechanical component vs Ogilvie syndrome. Abdomen is significantly distended. Patient has had a bowel movement today with flatus. -NPO -GI consulted for decompression -General surgery recommendations: GI -Abdominal x-ray pending today  Chronic anemia Likely chronic anemia. Slight drop in setting of fluid resuscitation. No evidence of bleeding. Stable.  History of DVT Patient is on Eliquis as an outpatient. Currently NPO. -Hold Eliquis -Continue Heparin while NPO  History of TBI -PT eval: SNF  Essential hypertension Normotensive. -Continue to hold home antihypertensives  History of alcohol  abuse Patient has been at SNF. -Discontinue CIWA  Pressure Injury Documentation: Pressure Injury 05/03/18 Deep Tissue Injury - Purple or maroon localized area of discolored intact skin or blood-filled blister due to damage of underlying soft tissue from pressure and/or shear. (Active)  05/03/18    Location: Buttocks  Location Orientation: Left;Right  Staging: Deep Tissue Injury - Purple or maroon localized area of discolored intact skin or blood-filled blister due to damage of underlying soft tissue from pressure and/or shear.  Wound Description (Comments):   Present on Admission: Yes     DVT prophylaxis: Heparin drip Code Status:   Code Status: Full Code Family Communication: [name] Disposition Plan: Discharge to SNF in several days   Consultants:   General surgery  Infectious disease  Gastroenterology  Procedures:   None  Antimicrobials:  Vancomycin (11/14>>  Cefepime (11/14)    Subjective: No abdominal pain.   Objective: Vitals:   05/03/18 0732 05/03/18 2136 05/04/18 0443 05/04/18 0904  BP: 109/66 (!) 132/112 119/80 (!) 120/106  Pulse: 85 81 90 100  Resp: 20 18 16  (!) 22  Temp: 99.6 F (37.6 C) 98.3 F (36.8 C) 98.6 F (37 C) 98.2 F (36.8 C)  TempSrc: Oral Oral Oral Oral  SpO2: 92% 97% 98% 93%  Weight:        Intake/Output Summary (Last 24 hours) at 05/04/2018 0924 Last data filed at 05/04/2018 0624 Gross per 24 hour  Intake 1791.27 ml  Output 2550 ml  Net -758.73 ml   Filed Weights   05/02/18 1413 05/02/18 2215  Weight: 90.7 kg 91.2 kg    Examination:  General exam: Appears calm and comfortable Respiratory system: Clear to auscultation. Respiratory effort normal. Cardiovascular system: S1 &  S2 heard, irregular rhythm with normal rate. No murmurs, rubs, gallops or clicks. Gastrointestinal system: Abdomen is significantly distended, soft and nontender. Normal bowel sounds heard. Central nervous system: Alert and  oriented. Extremities: No edema. No calf tenderness Skin: No cyanosis. No rashes Psychiatry: Judgement and insight appear normal. Mood & affect appropriate.     Data Reviewed: I have personally reviewed following labs and imaging studies  CBC: Recent Labs  Lab 05/02/18 1402 05/03/18 0533 05/04/18 0555  WBC 7.8 7.3 9.7  NEUTROABS 6.5  --   --   HGB 9.7* 8.9* 9.0*  HCT 30.2* 28.1* 28.0*  MCV 88.6 87.0 87.2  PLT 253 256 320   Basic Metabolic Panel: Recent Labs  Lab 05/02/18 1402 05/03/18 0533 05/04/18 0555  NA 131* 134* 136  K 3.4* 3.3* 3.3*  CL 102 104 110  CO2 19* 21* 20*  GLUCOSE 119* 110* 128*  BUN 82* 68* 46*  CREATININE 1.67* 1.16 0.85  CALCIUM 8.2* 8.4* 8.5*  MG  --  2.2  --   PHOS  --  2.9  --    GFR: Estimated Creatinine Clearance: 96.1 mL/min (by C-G formula based on SCr of 0.85 mg/dL). Liver Function Tests: Recent Labs  Lab 05/02/18 1402  AST 83*  ALT 36  ALKPHOS 67  BILITOT 0.5  PROT 6.1*  ALBUMIN 2.3*   No results for input(s): LIPASE, AMYLASE in the last 168 hours. No results for input(s): AMMONIA in the last 168 hours. Coagulation Profile: No results for input(s): INR, PROTIME in the last 168 hours. Cardiac Enzymes: No results for input(s): CKTOTAL, CKMB, CKMBINDEX, TROPONINI in the last 168 hours. BNP (last 3 results) No results for input(s): PROBNP in the last 8760 hours. HbA1C: No results for input(s): HGBA1C in the last 72 hours. CBG: Recent Labs  Lab 05/03/18 0823  GLUCAP 97   Lipid Profile: No results for input(s): CHOL, HDL, LDLCALC, TRIG, CHOLHDL, LDLDIRECT in the last 72 hours. Thyroid Function Tests: No results for input(s): TSH, T4TOTAL, FREET4, T3FREE, THYROIDAB in the last 72 hours. Anemia Panel: No results for input(s): VITAMINB12, FOLATE, FERRITIN, TIBC, IRON, RETICCTPCT in the last 72 hours. Sepsis Labs: Recent Labs  Lab 05/02/18 1412 05/02/18 1644 05/02/18 2344  PROCALCITON  --   --  6.29  LATICACIDVEN 1.13  1.66  --     Recent Results (from the past 240 hour(s))  Blood Culture (routine x 2)     Status: Abnormal (Preliminary result)   Collection Time: 05/02/18  1:46 PM  Result Value Ref Range Status   Specimen Description BLOOD RIGHT ANTECUBITAL  Final   Special Requests   Final    BOTTLES DRAWN AEROBIC AND ANAEROBIC Blood Culture adequate volume   Culture  Setup Time   Final    GRAM POSITIVE COCCI IN CLUSTERS IN BOTH AEROBIC AND ANAEROBIC BOTTLES CRITICAL VALUE NOTED.  VALUE IS CONSISTENT WITH PREVIOUSLY REPORTED AND CALLED VALUE. Performed at [facility], [address] [place], [place] [address]    Culture STAPHYLOCOCCUS AUREUS (A)  Final   Report Status PENDING  Incomplete  Blood Culture (routine x 2)     Status: Abnormal (Preliminary result)   Collection Time: 05/02/18  2:03 PM  Result Value Ref Range Status   Specimen Description BLOOD LEFT ANTECUBITAL  Final   Special Requests   Final    BOTTLES DRAWN AEROBIC AND ANAEROBIC Blood Culture adequate volume   Culture  Setup Time   Final    GRAM  POSITIVE COCCI IN CLUSTERS IN BOTH AEROBIC AND ANAEROBIC BOTTLES CRITICAL RESULT CALLED TO, READ BACK BY AND VERIFIED WITH: Gay Filler PharmD 10:05 05/03/18 ([email or web address])    Culture (A)  Final    STAPHYLOCOCCUS AUREUS SUSCEPTIBILITIES TO FOLLOW Performed at [facility], [address] [place], [place] [address]    Report Status PENDING  Incomplete  Blood Culture ID Panel (Reflexed)     Status: Abnormal   Collection Time: 05/02/18  2:03 PM  Result Value Ref Range Status   Enterococcus species NOT DETECTED NOT DETECTED Final   Listeria monocytogenes NOT DETECTED NOT DETECTED Final   Staphylococcus species DETECTED (A) NOT DETECTED Final    Comment: CRITICAL RESULT CALLED TO, READ BACK BY AND VERIFIED WITH: Gay Filler PharmD 10:05 05/03/18 ([email or web address])    Staphylococcus aureus (BCID) DETECTED (A) NOT DETECTED Final    Comment: Methicillin (oxacillin)-resistant Staphylococcus aureus  (MRSA). MRSA is predictably resistant to beta-lactam antibiotics (except ceftaroline). Preferred therapy is vancomycin unless clinically contraindicated. Patient requires contact precautions if  hospitalized. CRITICAL RESULT CALLED TO, READ BACK BY AND VERIFIED WITH: Gay Filler PharmD 10:05 05/03/18 ([email or web address])    Methicillin resistance DETECTED (A) NOT DETECTED Final    Comment: CRITICAL RESULT CALLED TO, READ BACK BY AND VERIFIED WITH: Gay Filler PharmD 10:05 05/03/18 ([email or web address])    Streptococcus species NOT DETECTED NOT DETECTED Final   Streptococcus agalactiae NOT DETECTED NOT DETECTED Final   Streptococcus pneumoniae NOT DETECTED NOT DETECTED Final   Streptococcus pyogenes NOT DETECTED NOT DETECTED Final   Acinetobacter baumannii NOT DETECTED NOT DETECTED Final   Enterobacteriaceae species NOT DETECTED NOT DETECTED Final   Enterobacter cloacae complex NOT DETECTED NOT DETECTED Final   Escherichia coli NOT DETECTED NOT DETECTED Final   Klebsiella oxytoca NOT DETECTED NOT DETECTED Final   Klebsiella pneumoniae NOT DETECTED NOT DETECTED Final   Proteus species NOT DETECTED NOT DETECTED Final   Serratia marcescens NOT DETECTED NOT DETECTED Final   Haemophilus influenzae NOT DETECTED NOT DETECTED Final   Neisseria meningitidis NOT DETECTED NOT DETECTED Final   Pseudomonas aeruginosa NOT DETECTED NOT DETECTED Final   Candida albicans NOT DETECTED NOT DETECTED Final   Candida glabrata NOT DETECTED NOT DETECTED Final   Candida krusei NOT DETECTED NOT DETECTED Final   Candida parapsilosis NOT DETECTED NOT DETECTED Final   Candida tropicalis NOT DETECTED NOT DETECTED Final    Comment: Performed at [facility], [address] [place], [place] [address]  MRSA PCR Screening     Status: Abnormal   Collection Time: 05/03/18  6:12 AM  Result Value Ref Range Status   MRSA by PCR POSITIVE (A) NEGATIVE Final    Comment:        The GeneXpert MRSA Assay (FDA approved for NASAL specimens only), is  one component of a comprehensive MRSA colonization surveillance program. It is not intended to diagnose MRSA infection nor to guide or monitor treatment for MRSA infections. RESULT CALLED TO, READ BACK BY AND VERIFIED WITH: Lyla Glassing RN 9:40 05/03/18 ([email or web address]) Performed at [facility], [address] [place], [place] [address]   Culture, blood (routine x 2)     Status: None (Preliminary result)   Collection Time: 05/03/18 10:01 PM  Result Value Ref Range Status   Specimen Description BLOOD RIGHT HAND  Final   Special Requests   Final    BOTTLES DRAWN AEROBIC AND ANAEROBIC Blood Culture adequate volume   Culture   Final    NO  GROWTH < 12 HOURS Performed at [facility], [address], [place], [place] [address]    Report Status PENDING  Incomplete  Culture, blood (routine x 2)     Status: None (Preliminary result)   Collection Time: 05/03/18 10:09 PM  Result Value Ref Range Status   Specimen Description BLOOD LEFT HAND  Final   Special Requests   Final    BOTTLES DRAWN AEROBIC ONLY Blood Culture adequate volume   Culture   Final    NO GROWTH < 12 HOURS Performed at [facility], [address] [place], [place] [address]    Report Status PENDING  Incomplete         Radiology Studies: Ct Abdomen Pelvis Wo Contrast  Result Date: 05/02/2018 CLINICAL DATA:  Abdominal distention EXAM: CT ABDOMEN AND PELVIS WITHOUT CONTRAST TECHNIQUE: Multidetector CT imaging of the abdomen and pelvis was performed following the standard protocol without IV contrast. COMPARISON:  Same day radiographs of the abdomen and pelvis FINDINGS: Lower chest: Top-normal heart size. Trace pleural effusions with bibasilar atelectasis. Hepatobiliary: No focal liver abnormality is seen. No gallstones, gallbladder wall thickening, or biliary dilatation. Pancreas: Unremarkable. No pancreatic ductal dilatation or surrounding inflammatory changes. Spleen: Normal in size without focal  abnormality. Adrenals/Urinary Tract: Normal bilateral adrenal glands. No nephrolithiasis. Motion artifacts limit assessment at the level of the kidneys however there does appear to be a hypodense cystic lesion of the interpolar left kidney measuring up to 6 cm. No nephrolithiasis nor hydroureteronephrosis. Foley decompressed urinary bladder. Stomach/Bowel: Small hiatal hernia. Decompressed stomach and small bowel loops. Normal small bowel rotation. Moderate gaseous distention of the colon up to 9 cm at the level of the cecum with liquid stool noted within from cecum to distal descending colon likely representing large bowel dysmotility or ileus. Circumferential thickening surrounding a small stool ball is noted within the rectum. Mild stercoral proctitis is not excluded. Vascular/Lymphatic: IVC filter in place. Atherosclerosis of the aorta without aneurysm. No adenopathy. Reproductive: Normal size prostate. Other: Soft tissue anasarca. No free air or free fluid. Musculoskeletal: Status post ventral hernia repair. Small amount of fat in the right inguinal canal. IMPRESSION: 1. Moderate gaseous distention of the colon up to 9 cm at the level of the cecum with liquid stool noted within from cecum to distal descending colon. Findings may represent a large bowel dysmotility or ileus. Mechanical source of obstruction is identified. 2. Circumferential thickening surrounding a small stool ball within the rectum. Mild stercoral proctitis could have this appearance. 3. 6 cm hypodense cystic lesion of the interpolar left kidney, likely a simple cyst however further characterization is limited due to motion. 4. IVC filter in place. 5. Mild soft tissue anasarca. Electronically Signed   By: Tollie Eth M.D.   On: 05/02/2018 16:46   Dg Abdomen 1 View  Result Date: 05/02/2018 CLINICAL DATA:  67 y/o  M; abdominal distention. EXAM: ABDOMEN - 1 VIEW COMPARISON:  05/02/2018 CT abdomen and pelvis. FINDINGS: Diffuse air-filled  distention of the large bowel. Upper abdomen is excluded from the field of view. IVC filter in situ. Advanced degenerative changes of the visible lumbar spine. Lower abdominal wall mesh hernia repair anchors noted. IMPRESSION: Diffuse air-filled distention of the large bowel, possibly chronic large bowel dysmotility or ileus. Electronically Signed   By: Mitzi Hansen M.D.   On: 05/02/2018 20:39   Dg Chest Port 1 View  Result Date: 05/02/2018 CLINICAL DATA:  Fever EXAM: PORTABLE CHEST 1 VIEW COMPARISON:  03/03/2018 FINDINGS: The heart size and mediastinal contours are within normal limits. Both lungs are clear. The visualized skeletal structures are unremarkable. IMPRESSION: No active disease. Electronically Signed   By: Marlan Palauharles  Clark M.D.   On: 05/02/2018 14:35        Scheduled Meds: . Chlorhexidine Gluconate Cloth  6 each Topical Q0600  . liver oil-zinc oxide   Topical QID  . mupirocin ointment  1 application Nasal BID   Continuous Infusions: . dextrose 5 % and 0.45% NaCl 75 mL/hr at 05/03/18 2037  . heparin 1,750 Units/hr (05/04/18 0852)  . vancomycin 750 mg (05/04/18 0406)     LOS: 2 days     Jacquelin Hawkingalph Jenniah Bhavsar, MD [facility] 05/04/2018, 9:24 AM  If 7PM-7AM, please contact night-coverage [email or web address]

## 2018-05-04 NOTE — Progress Notes (Signed)
  Echocardiogram 2D Echocardiogram has been performed.  Wayne Buckley 05/04/2018, 2:48 PM

## 2018-05-04 NOTE — Consult Note (Signed)
Referring Provider: Dr. Caleb Popp Primary Care Physician:  [name], MD Primary Gastroenterologist:  Gentry Fitz  Reason for Consultation:  Ileus  HPI: Wayne Buckley is a 67 y.o. male with a history of a traumatic brain injury who had neck surgery in September who was admitted on 05/02/18 for MRSA bacteremia and is seen for a consult due to abdominal distention. Last BM was the reportedly on 05/02/18 after admit here and no BMs since then. Denies abdominal pain/N/V. K low at 3.3. CT showed colonic distention up to 9 cm in cecum with liquid stool throughout colon without obstruction. Stool ball in rectum with surrounding wall thickening. [name].  Past Medical History:  Diagnosis Date  . Hypertension   . TBI (traumatic brain injury) (HCC) 2013    Past Surgical History:  Procedure Laterality Date  . ANTERIOR CERVICAL DECOMP/DISCECTOMY FUSION N/A 02/27/2018   Procedure: ANTERIOR CERVICAL DECOMPRESSION/DISCECTOMY FUSION C6-7;  Surgeon: Donalee Citrin, MD;  Location: [facility] OR;  Service: Neurosurgery;  Laterality: N/A;  . CRANIOTOMY  04/28/2012   Procedure: CRANIOTOMY HEMATOMA EVACUATION SUBDURAL;  Surgeon: Reinaldo Meeker, MD;  Location: [facility] OR;  Service: Neurosurgery;  Laterality: Left;  Left Craniotomy for Subdural Hematoma  . RADIOLOGY WITH ANESTHESIA N/A 02/27/2018   Procedure: MRI WITH ANESTHESIA;  Surgeon: Radiologist, Medication, MD;  Location: [facility] OR;  Service: Radiology;  Laterality: N/A;  . VENA CAVA FILTER PLACEMENT Right 02/27/2018   Procedure: INSERTION VENA-CAVA FILTER;  Surgeon: Nada Libman, MD;  Location: [facility] OR;  Service: Vascular;  Laterality: Right;    Prior to Admission medications   Medication Sig Start Date End Date Taking? Authorizing Provider  acetaminophen (TYLENOL) 325 MG tablet Take 2 tablets (650 mg total) by mouth every 6 (six) hours as needed for mild pain. 04/03/18  Yes Angiulli, Mcarthur Rossetti, PA-C  acetaminophen (TYLENOL) 500 MG tablet Take 1,000 mg by mouth  every 6 (six) hours as needed for mild pain.   Yes [provider]  amLODipine (NORVASC) 10 MG tablet Take 1 tablet (10 mg total) by mouth daily. 03/06/18  Yes Leroy Sea, MD  apixaban (ELIQUIS) 5 MG TABS tablet Take 1 tablet (5 mg total) by mouth 2 (two) times daily. 04/03/18  Yes Angiulli, Mcarthur Rossetti, PA-C  bisacodyl (DULCOLAX) 10 MG suppository Place 1 suppository (10 mg total) rectally daily at 6 (six) AM. 04/03/18  Yes Angiulli, Mcarthur Rossetti, PA-C  carvedilol (COREG) 12.5 MG tablet Take 1 tablet (12.5 mg total) by mouth 2 (two) times daily with a meal. 04/03/18  Yes Angiulli, Mcarthur Rossetti, PA-C  cefTRIAXone (ROCEPHIN) 1 g injection Inject 1 g into the muscle daily. For 7 days for pneumonia. 04/30/18 05/07/18 Yes [provider]  hydrALAZINE (APRESOLINE) 50 MG tablet Take 1 tablet (50 mg total) by mouth every 8 (eight) hours. 04/03/18  Yes Angiulli, Mcarthur Rossetti, PA-C  hydrochlorothiazide (HYDRODIURIL) 25 MG tablet Take 1 tablet (25 mg total) by mouth daily. 04/04/18  Yes Angiulli, Mcarthur Rossetti, PA-C  nitrofurantoin, macrocrystal-monohydrate, (MACROBID) 100 MG capsule Take 100 mg by mouth every 12 (twelve) hours. 04/30/18 05/07/18 Yes [provider]  potassium chloride SA (K-DUR,KLOR-CON) 20 MEQ tablet Take 1 tablet (20 mEq total) by mouth 2 (two) times daily. 04/03/18  Yes Angiulli, Mcarthur Rossetti, PA-C  tamsulosin (FLOMAX) 0.4 MG CAPS capsule Take 1 capsule (0.4 mg total) by mouth daily. 03/06/18  Yes Leroy Sea, MD  vitamin C (VITAMIN C) 1000 MG tablet Take 1 tablet (1,000 mg total) by  mouth 2 (two) times daily. 04/03/18  Yes Angiulli, Mcarthur Rossetti, PA-C    Scheduled Meds: . bisacodyl  10 mg Rectal Daily  . Chlorhexidine Gluconate Cloth  6 each Topical Q0600  . liver oil-zinc oxide   Topical QID  . mupirocin ointment  1 application Nasal BID  . simethicone  40 mg Oral QID   Continuous Infusions: . dextrose 5 % and 0.45% NaCl 75 mL/hr at 05/04/18 1048  . heparin 1,750  Units/hr (05/04/18 1050)  . potassium chloride 10 mEq (05/04/18 1130)  . vancomycin 750 mg (05/04/18 0406)   PRN Meds:.  Allergies as of 05/02/2018  . (No Known Allergies)    Family History  Problem Relation Age of Onset  . Heart disease Father     Social History   Socioeconomic History  . Marital status: Married    Spouse name: Not on file  . Number of children: Not on file  . Years of education: Not on file  . Highest education level: Not on file  Occupational History  . Occupation: retired Counsellor  . Financial resource strain: Not on file  . Food insecurity:    Worry: Not on file    Inability: Not on file  . Transportation needs:    Medical: Not on file    Non-medical: Not on file  Tobacco Use  . Smoking status: Never Smoker  . Smokeless tobacco: Never Used  Substance and Sexual Activity  . Alcohol use: Yes    Comment: daily   . Drug use: Not on file  . Sexual activity: Not on file  Lifestyle  . Physical activity:    Days per week: Not on file    Minutes per session: Not on file  . Stress: Not on file  Relationships  . Social connections:    Talks on phone: Not on file    Gets together: Not on file    Attends religious service: Not on file    Active member of club or organization: Not on file    Attends meetings of clubs or organizations: Not on file    Relationship status: Not on file  . Intimate partner violence:    Fear of current or ex partner: Not on file    Emotionally abused: Not on file    Physically abused: Not on file    Forced sexual activity: Not on file  Other Topics Concern  . Not on file  Social History Narrative  . Not on file    Review of Systems: All negative except as stated above in HPI.  Physical Exam: Vital signs: Vitals:   05/04/18 0443 05/04/18 0904  BP: 119/80 (!) 120/106  Pulse: 90 100  Resp: 16 (!) 22  Temp: 98.6 F (37 C) 98.2 F (36.8 C)  SpO2: 98% 93%   Last BM Date: 05/03/18 General:    Alert,  Well-developed, well-nourished, pleasant and cooperative in NAD Head: normocephalic Eyes: anicteric sclera ENT: oropharynx clear Neck: supple, nontender Lungs:  Clear throughout to auscultation.   No wheezes, crackles, or rhonchi. No acute distress. Heart:  Regular rate and rhythm; no murmurs, clicks, rubs,  or gallops. Abdomen: severe distention, decreased bowel sounds, nontender  Rectal:  Deferred Ext: no edema  GI:  Lab Results: Recent Labs    05/02/18 1402 05/03/18 0533 05/04/18 0555  WBC 7.8 7.3 9.7  HGB 9.7* 8.9* 9.0*  HCT 30.2* 28.1* 28.0*  PLT 253 256 320   BMET Recent Labs  05/02/18 1402 05/03/18 0533 05/04/18 0555  NA 131* 134* 136  K 3.4* 3.3* 3.3*  CL 102 104 110  CO2 19* 21* 20*  GLUCOSE 119* 110* 128*  BUN 82* 68* 46*  CREATININE 1.67* 1.16 0.85  CALCIUM 8.2* 8.4* 8.5*   LFT Recent Labs    05/02/18 1402  PROT 6.1*  ALBUMIN 2.3*  AST 83*  ALT 36  ALKPHOS 67  BILITOT 0.5   PT/INR No results for input(s): LABPROT, INR in the last 72 hours.   Studies/Results: Ct Abdomen Pelvis Wo Contrast  Result Date: 05/02/2018 CLINICAL DATA:  Abdominal distention EXAM: CT ABDOMEN AND PELVIS WITHOUT CONTRAST TECHNIQUE: Multidetector CT imaging of the abdomen and pelvis was performed following the standard protocol without IV contrast. COMPARISON:  Same day radiographs of the abdomen and pelvis FINDINGS: Lower chest: Top-normal heart size. Trace pleural effusions with bibasilar atelectasis. Hepatobiliary: No focal liver abnormality is seen. No gallstones, gallbladder wall thickening, or biliary dilatation. Pancreas: Unremarkable. No pancreatic ductal dilatation or surrounding inflammatory changes. Spleen: Normal in size without focal abnormality. Adrenals/Urinary Tract: Normal bilateral adrenal glands. No nephrolithiasis. Motion artifacts limit assessment at the level of the kidneys however there does appear to be a hypodense cystic lesion of the  interpolar left kidney measuring up to 6 cm. No nephrolithiasis nor hydroureteronephrosis. Foley decompressed urinary bladder. Stomach/Bowel: Small hiatal hernia. Decompressed stomach and small bowel loops. Normal small bowel rotation. Moderate gaseous distention of the colon up to 9 cm at the level of the cecum with liquid stool noted within from cecum to distal descending colon likely representing large bowel dysmotility or ileus. Circumferential thickening surrounding a small stool ball is noted within the rectum. Mild stercoral proctitis is not excluded. Vascular/Lymphatic: IVC filter in place. Atherosclerosis of the aorta without aneurysm. No adenopathy. Reproductive: Normal size prostate. Other: Soft tissue anasarca. No free air or free fluid. Musculoskeletal: Status post ventral hernia repair. Small amount of fat in the right inguinal canal. IMPRESSION: 1. Moderate gaseous distention of the colon up to 9 cm at the level of the cecum with liquid stool noted within from cecum to distal descending colon. Findings may represent a large bowel dysmotility or ileus. Mechanical source of obstruction is identified. 2. Circumferential thickening surrounding a small stool ball within the rectum. Mild stercoral proctitis could have this appearance. 3. 6 cm hypodense cystic lesion of the interpolar left kidney, likely a simple cyst however further characterization is limited due to motion. 4. IVC filter in place. 5. Mild soft tissue anasarca. Electronically Signed   By: Tollie Eth M.D.   On: 05/02/2018 16:46   Dg Abdomen 1 View  Result Date: 05/02/2018 CLINICAL DATA:  67 y/o  M; abdominal distention. EXAM: ABDOMEN - 1 VIEW COMPARISON:  05/02/2018 CT abdomen and pelvis. FINDINGS: Diffuse air-filled distention of the large bowel. Upper abdomen is excluded from the field of view. IVC filter in situ. Advanced degenerative changes of the visible lumbar spine. Lower abdominal wall mesh hernia repair anchors noted.  IMPRESSION: Diffuse air-filled distention of the large bowel, possibly chronic large bowel dysmotility or ileus. Electronically Signed   By: Mitzi Hansen M.D.   On: 05/02/2018 20:39   Dg Chest Port 1 View  Result Date: 05/02/2018 CLINICAL DATA:  Fever EXAM: PORTABLE CHEST 1 VIEW COMPARISON:  03/03/2018 FINDINGS: The heart size and mediastinal contours are within normal limits. Both lungs are clear. The visualized skeletal structures are unremarkable. IMPRESSION: No active disease. Electronically Signed   By: Leonette Most  Chestine Sporelark M.D.   On: 05/02/2018 14:35   Dg Abd Portable 1v  Result Date: 05/04/2018 CLINICAL DATA:  Follow-up ileus EXAM: PORTABLE ABDOMEN - 1 VIEW COMPARISON:  05/02/2018 FINDINGS: Distended colon is again noted and stable consistent with generalized ileus. No definitive obstructive changes are seen. Postsurgical changes in the low pelvis are noted. Degenerative changes of the lumbar spine are seen. No free air is noted. IMPRESSION: Stable gaseous dilatation of the colon consistent with ileus. Electronically Signed   By: Alcide CleverMark  Lukens M.D.   On: 05/04/2018 09:40    Impression/Plan: Colonic ileus with no BMs since 05/02/18. Needs aggressive correction of potassium to help with gut motility. Trial of enemas and if no success in moving bowels then may need an unprepped flex sig for decompression tomorrow. Keep NPO for now. IVFs. Supportive care. Will follow.    LOS: 2 days   Shirley FriarVincent C Zayaan Kozak  05/04/2018, 1:12 PM  Questions please call [phone]

## 2018-05-04 NOTE — Clinical Social Work Note (Signed)
Clinical Social Work Assessment  Patient Details  Name: Wayne Buckley MRN: [id number] Date of Birth: 08/22/1950  Date of referral:  05/04/18               Reason for consult:  Discharge Planning                Permission sought to share information with:  Case Manager, Family Supports, Facility Medical sales representativeContact Representative Permission granted to share information::  Yes, Verbal Permission Granted  Name::     Web designerDenise  Agency::  SNF[facility]  Relationship::  spouse  Contact Information:  [phone]  Housing/Transportation Living arrangements for the past 2 months:  Skilled Nursing Facility Source of Information:  Patient Patient Interpreter Needed:  None Criminal Activity/Legal Involvement Pertinent to Current Situation/Hospitalization:  No - Comment as needed Significant Relationships:  Spouse Lives with:  Facility Resident Do you feel safe going back to the place where you live?  Yes Need for family participation in patient care:  Yes (Comment)  Care giving concerns:  CSW received referral for possible SNF placement at time of discharge. Spoke with patient's wife regarding possibility of SNF placement . [name] at their home given patient's current needs and fall risk.  Patient and his wife expressed understanding of PT recommendation and are agreeable to SNF placement at time of discharge. CSW to continue to follow and assist with discharge planning needs.     Social Worker assessment / plan:  Spoke with patient and wife [name]  concerning [facility] before [facility]. [facility] and wife has reached out to [facility] to notify of their possible return on Monday 05/06/18. They report having a bed possibly on Monday, however they are not holding patient's bed he was in at this time.   Employment status:  Retired Health and safety inspectornsurance information:  Medicare PT Recommendations:  Skilled Nursing Facility Information / Referral to  community resources:  Skilled Nursing Facility  Patient/Family's Response to care:  Patient and wife [name] need for rehab before returning home and are agreeable [place]. [facility]. CSW explained insurance authorization process. Patient's family reported that they want patient to get stronger to be able to come back home.    Patient/Family's Understanding of and Emotional Response to Diagnosis, Current Treatment, and Prognosis:  Patient/family is realistic regarding therapy needs and expressed being hopeful for SNF placement. Patient expressed understanding of CSW role and discharge process as well as medical condition. No questions/concerns about plan or treatment.    Emotional Assessment Appearance:  Appears stated age Attitude/Demeanor/Rapport:  Engaged, Gracious Affect (typically observed):  Accepting Orientation:  Oriented to Place, Oriented to Self Alcohol / Substance use:  Not Applicable Psych involvement (Current and /or in the community):  No (Comment)  Discharge Needs  Concerns to be addressed:  Discharge Planning Concerns Readmission within the last 30 days:  No Current discharge risk:  Dependent with Mobility Barriers to Discharge:  Continued Medical Work up   Dynegyshley M Janiel Crisostomo, LCSW 05/04/2018, 4:20 PM

## 2018-05-04 NOTE — Progress Notes (Signed)
ANTICOAGULATION CONSULT NOTE - Initial Consult  Pharmacy Consult for heparin Indication: h/o DVT  No Known Allergies  Patient Measurements: Weight: 201 lb 1 oz (91.2 kg) Heparin Dosing Weight: 91  Vital Signs: Temp: 98.6 F (37 C) (11/16 0443) Temp Source: Oral (11/16 0443) BP: 119/80 (11/16 0443) Pulse Rate: 90 (11/16 0443)  Labs: Recent Labs    05/02/18 1402 05/03/18 0533 05/03/18 2212 05/04/18 0555  HGB 9.7* 8.9*  --  9.0*  HCT 30.2* 28.1*  --  28.0*  PLT 253 256  --  320  APTT  --   --  53* 50*  HEPARINUNFRC  --   --  >2.20*  --   CREATININE 1.67* 1.16  --  0.85    Estimated Creatinine Clearance: 96.1 mL/min (by C-G formula based on SCr of 0.85 mg/dL).   Medical History: Past Medical History:  Diagnosis Date  . Hypertension   . TBI (traumatic brain injury) (HCC) 2013    Assessment: 67 yo male with h/o DVT on apixaban PTA. Last known dose of apixaban was 11/14 at 1100. MD would like to start a heparin drip for therapeutic anticoagulation while apixaban is on hold in the setting of ileus. Since patient has been on apixaban, we will use aPTT initially to titrate heparin due to apixaban's effect on heparin levels.    11/16 AM heparin level continues to be subtherapeutic at 50 (goal 66-102). CBC stable with hgb 9.0 and pltc 320. Per RN no infusion issues noted, will increase and check 6 hour level.  Goal of Therapy:  Heparin level 0.3-0.7 units/ml  APTT 66-102 sec Monitor platelets by anticoagulation protocol: Yes   Plan:  Increase Heparin drip to 1750 units/hr, no bolus APTT and Heparin level at 6 hours Daily heparin level and CBC, Monitor for bleeding  Thank you for involving pharmacy in this patient's care.  Wendelyn Breslowylan Judy Pollman, PharmD PGY1 Pharmacy Resident Phone: [phone] 05/04/2018 8:40 AM

## 2018-05-04 NOTE — NC FL2 (Signed)
[place] MEDICAID FL2 LEVEL OF CARE SCREENING TOOL     IDENTIFICATION  Patient Name: Wayne Buckley Birthdate: 01/23/1951 Sex: male Admission Date (Current Location): 05/02/2018  [facility] [place]   Facility and Address:  [facility] [place], [address], [place], [place] [address]      Provider Number: [id number]  Attending Physician Name and Address:  Narda BondsNettey, Ralph A, MD  Relative Name and Phone Number:  [name] [phone]    Current Level of Care: Hospital Recommended Level of Care: Skilled Nursing Facility Prior Approval Number:    Date Approved/Denied:   PASRR Number: [id number]  Discharge Plan: SNF    Current Diagnoses: Patient Active Problem List   Diagnosis Date Noted  . 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 Chronic anemia 05/03/2018  . Hyponatremia 05/03/2018  . Sacral ulcer (HCC) 05/03/2018  . History of DVT (deep vein thrombosis) 05/03/2018  . Traumatic brain injury (HCC) 05/03/2018  . MRSA bacteremia 05/03/2018  . Sepsis (HCC) 05/02/2018  . Acute blood loss anemia   . C5-C7 level spinal cord injury (HCC)   . Central cord syndrome (HCC)   . Neurogenic bladder   . Essential hypertension   . AKI (acute kidney injury) (HCC) 02/26/2018  . Bilateral leg weakness 02/26/2018  . Leg DVT (deep venous thromboembolism), acute, left (HCC) 02/26/2018  . Fall 02/25/2018  . Alcohol abuse 05/04/2012  . Hypokalemia 05/04/2012  . Urinary retention 05/04/2012  . Subdural hematoma, post-traumatic (HCC) 04/28/2012    Orientation RESPIRATION BLADDER Height & Weight     Self, Place  Normal Incontinent(urinary catheter) Weight: 201 lb 1 oz (91.2 kg) Height:     BEHAVIORAL SYMPTOMS/MOOD NEUROLOGICAL BOWEL NUTRITION STATUS      Incontinent Diet(see discharge summary)  AMBULATORY STATUS COMMUNICATION OF NEEDS Skin   Extensive Assist Verbally PU Stage and Appropriate Care(MASD perineum, pressure injury on Buttocks)                        Personal Care Assistance Level of Assistance  Bathing, Feeding, Dressing, Total care Bathing Assistance: Maximum assistance Feeding assistance: Limited assistance Dressing Assistance: Maximum assistance Total Care Assistance: Maximum assistance   Functional Limitations Info  Sight, Hearing, Speech Sight Info: Adequate Hearing Info: Adequate Speech Info: Impaired(slurred at times)    SPECIAL CARE FACTORS FREQUENCY  PT (By licensed PT), OT (By licensed OT)     PT Frequency: min 5x weekly OT Frequency: min 3x weekly            Contractures Contractures Info: Not present    Additional Factors Info  Code Status, Allergies Code Status Info: Full Allergies Info: No Known Allergies           Current Medications (05/04/2018):  This is the current hospital active medication list Current Facility-Administered Medications  Medication Dose Route Frequency Provider Last Rate Last Dose  . bisacodyl (DULCOLAX) suppository 10 mg  10 mg Rectal Daily Karie SodaGross, Steven, MD   10 mg at 05/04/18 1259  . Chlorhexidine Gluconate Cloth 2 % PADS 6 each  6 each Topical Q0600 Narda BondsNettey, Ralph A, MD   6 each at 05/04/18 [phone]  . dextrose 5 %-0.45 % sodium chloride infusion   Intravenous Continuous Narda BondsNettey, Ralph A, MD 75 mL/hr at 05/04/18 1048    . heparin ADULT infusion 100 units/mL (25000 units/24350mL sodium chloride 0.45%)  1,950 Units/hr Intravenous Continuous Lodema Hongierce, Dwayne A, RPH 19.5 mL/hr at 05/04/18 1628 1,950 Units/hr at 05/04/18  1628  . liver oil-zinc oxide (DESITIN) 40 % ointment   Topical QID Narda Bonds, MD      . mupirocin ointment (BACTROBAN) 2 % 1 application  1 application Nasal BID Narda Bonds, MD   1 application at 05/04/18 1110  . simethicone (MYLICON) 40 MG/0.6ML suspension 40 mg  40 mg Oral QID Karie Soda, MD   40 mg at 05/04/18 1400  . vancomycin (VANCOCIN) IVPB 750 mg/150 ml premix  750 mg Intravenous Q12H John Giovanni, MD 150 mL/hr at 05/04/18 1605 750 mg at  05/04/18 1605     Discharge Medications: Please see discharge summary for a list of discharge medications.  Relevant Imaging Results:  Relevant Lab Results:   Additional Information SSN: [id number]  Gildardo Griffes, LCSW

## 2018-05-04 NOTE — Progress Notes (Signed)
1630- patient had 2 large, brown, and watery stools after suppository given.   1740- After tap enema administration, patient was placed on bed pan. Patient filled bed pan completely. Watery and brown stool.

## 2018-05-04 NOTE — Progress Notes (Signed)
[facility] Progress Note     Subjective: CC-  No new complaints. Continues to complain of abdominal distension but denies any pain. Denies n/v. No flatus or BM. States that he's hungry.  Objective: Vital signs in last 24 hours: Temp:  [98.3 F (36.8 C)-98.6 F (37 C)] 98.6 F (37 C) (11/16 0443) Pulse Rate:  [81-90] 90 (11/16 0443) Resp:  [16-18] 16 (11/16 0443) BP: (119-132)/(80-112) 119/80 (11/16 0443) SpO2:  [97 %-98 %] 98 % (11/16 0443) Last BM Date: 05/03/18  Intake/Output from previous day: 11/15 0701 - 11/16 0700 In: 1791.3 [I.V.:1022.8; IV Piggyback:768.5] Out: 2750 [Urine:2750] Intake/Output this shift: No intake/output data recorded.  PE: Gen:  Alert, NAD HEENT: EOM's intact, pupils equal and round Pulm:  effort normal Abd: very distended/tympanic but soft, +BS, no HSM, no hernia, nontender Ext: calves soft and nontender Skin: no rashes noted, warm and dry  Lab Results:  Recent Labs    05/03/18 0533 05/04/18 0555  WBC 7.3 9.7  HGB 8.9* 9.0*  HCT 28.1* 28.0*  PLT 256 320   BMET Recent Labs    05/03/18 0533 05/04/18 0555  NA 134* 136  K 3.3* 3.3*  CL 104 110  CO2 21* 20*  GLUCOSE 110* 128*  BUN 68* 46*  CREATININE 1.16 0.85  CALCIUM 8.4* 8.5*   PT/INR No results for input(s): LABPROT, INR in the last 72 hours. CMP     Component Value Date/Time   NA 136 05/04/2018 0555   K 3.3 (L) 05/04/2018 0555   CL 110 05/04/2018 0555   CO2 20 (L) 05/04/2018 0555   GLUCOSE 128 (H) 05/04/2018 0555   BUN 46 (H) 05/04/2018 0555   CREATININE 0.85 05/04/2018 0555   CALCIUM 8.5 (L) 05/04/2018 0555   PROT 6.1 (L) 05/02/2018 1402   ALBUMIN 2.3 (L) 05/02/2018 1402   AST 83 (H) 05/02/2018 1402   ALT 36 05/02/2018 1402   ALKPHOS 67 05/02/2018 1402   BILITOT 0.5 05/02/2018 1402   GFRNONAA >60 05/04/2018 0555   GFRAA >60 05/04/2018 0555   Lipase  No results found for: LIPASE     Studies/Results: Ct Abdomen Pelvis Wo  Contrast  Result Date: 05/02/2018 CLINICAL DATA:  Abdominal distention EXAM: CT ABDOMEN AND PELVIS WITHOUT CONTRAST TECHNIQUE: Multidetector CT imaging of the abdomen and pelvis was performed following the standard protocol without IV contrast. COMPARISON:  Same day radiographs of the abdomen and pelvis FINDINGS: Lower chest: Top-normal heart size. Trace pleural effusions with bibasilar atelectasis. Hepatobiliary: No focal liver abnormality is seen. No gallstones, gallbladder wall thickening, or biliary dilatation. Pancreas: Unremarkable. No pancreatic ductal dilatation or surrounding inflammatory changes. Spleen: Normal in size without focal abnormality. Adrenals/Urinary Tract: Normal bilateral adrenal glands. No nephrolithiasis. Motion artifacts limit assessment at the level of the kidneys however there does appear to be a hypodense cystic lesion of the interpolar left kidney measuring up to 6 cm. No nephrolithiasis nor hydroureteronephrosis. Foley decompressed urinary bladder. Stomach/Bowel: Small hiatal hernia. Decompressed stomach and small bowel loops. Normal small bowel rotation. Moderate gaseous distention of the colon up to 9 cm at the level of the cecum with liquid stool noted within from cecum to distal descending colon likely representing large bowel dysmotility or ileus. Circumferential thickening surrounding a small stool ball is noted within the rectum. Mild stercoral proctitis is not excluded. Vascular/Lymphatic: IVC filter in place. Atherosclerosis of the aorta without aneurysm. No adenopathy. Reproductive: Normal size prostate. Other: Soft tissue anasarca. No free air or free fluid.  Musculoskeletal: Status post ventral hernia repair. Small amount of fat in the right inguinal canal. IMPRESSION: 1. Moderate gaseous distention of the colon up to 9 cm at the level of the cecum with liquid stool noted within from cecum to distal descending colon. Findings may represent a large bowel dysmotility or  ileus. Mechanical source of obstruction is identified. 2. Circumferential thickening surrounding a small stool ball within the rectum. Mild stercoral proctitis could have this appearance. 3. 6 cm hypodense cystic lesion of the interpolar left kidney, likely a simple cyst however further characterization is limited due to motion. 4. IVC filter in place. 5. Mild soft tissue anasarca. Electronically Signed   By: Tollie Ethavid  Kwon M.D.   On: 05/02/2018 16:46   Dg Abdomen 1 View  Result Date: 05/02/2018 CLINICAL DATA:  67 y/o  M; abdominal distention. EXAM: ABDOMEN - 1 VIEW COMPARISON:  05/02/2018 CT abdomen and pelvis. FINDINGS: Diffuse air-filled distention of the large bowel. Upper abdomen is excluded from the field of view. IVC filter in situ. Advanced degenerative changes of the visible lumbar spine. Lower abdominal wall mesh hernia repair anchors noted. IMPRESSION: Diffuse air-filled distention of the large bowel, possibly chronic large bowel dysmotility or ileus. Electronically Signed   By: Mitzi HansenLance  Furusawa-Stratton M.D.   On: 05/02/2018 20:39   Dg Chest Port 1 View  Result Date: 05/02/2018 CLINICAL DATA:  Fever EXAM: PORTABLE CHEST 1 VIEW COMPARISON:  03/03/2018 FINDINGS: The heart size and mediastinal contours are within normal limits. Both lungs are clear. The visualized skeletal structures are unremarkable. IMPRESSION: No active disease. Electronically Signed   By: Marlan Palauharles  Clark M.D.   On: 05/02/2018 14:35    Anti-infectives: Anti-infectives (From admission, onward)   Start     Dose/Rate Route Frequency Ordered Stop   05/03/18 1400  ceFEPIme (MAXIPIME) 2 g in sodium chloride 0.9 % 100 mL IVPB  Status:  Discontinued     2 g 200 mL/hr over 30 Minutes Intravenous Every 24 hours 05/02/18 1526 05/03/18 1026   05/03/18 0330  vancomycin (VANCOCIN) IVPB 750 mg/150 ml premix     750 mg 150 mL/hr over 60 Minutes Intravenous Every 12 hours 05/02/18 1526     05/02/18 1500  vancomycin (VANCOCIN) IVPB 1000  mg/200 mL premix     1,000 mg 200 mL/hr over 60 Minutes Intravenous NOW 05/02/18 1448 05/02/18 1620   05/02/18 1400  vancomycin (VANCOCIN) IVPB 1000 mg/200 mL premix     1,000 mg 200 mL/hr over 60 Minutes Intravenous  Once 05/02/18 1354 05/02/18 1520   05/02/18 1400  ceFEPIme (MAXIPIME) 2 g in sodium chloride 0.9 % 100 mL IVPB     2 g 200 mL/hr over 30 Minutes Intravenous  Once 05/02/18 1354 05/02/18 1445       Assessment/Plan HTN TBI Central cord syndrome HTN Neurogenic bladder with chronic indwelling foley 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 No acute surgical intervention recommended. GI consult pending, may need decompressive flex sig, possibly neostigmine if not improving. Continue NPO/bowel rest and IVF resuscitation. Keep electrolytes replaced - K+4 and Mag 2. Treatment per GI.  ID - currently vancomycin 11/14>> FEN - IVF, NPO VTE - SCDs, IV heparin Foley - none Follow up - TBD   LOS: 2 days    Franne FortsBrooke A Angie Piercey , [facility] 05/04/2018, 8:55 AM Pager: [phone]  Mon 7:00 am -11:30 AM Tues-Fri 7:00 am-4:30 pm Sat-Sun 7:00 am-11:30 am

## 2018-05-04 NOTE — Progress Notes (Signed)
ANTICOAGULATION CONSULT NOTE - Follow Up Consult  Pharmacy Consult for heparin Indication: h/o DVT  Labs: Recent Labs    05/02/18 1402 05/03/18 0533 05/03/18 2212  HGB 9.7* 8.9*  --   HCT 30.2* 28.1*  --   PLT 253 256  --   APTT  --   --  53*  HEPARINUNFRC  --   --  >2.20*  CREATININE 1.67* 1.16  --      Assessment: 67yo male subtherapeutic on heparin with initial dosing while Eliquis on hold; RN reports no gtt issues or signs of bleeding.  Goal of Therapy:  aPTT 66-102 seconds   Plan:  Will increase heparin gtt by 2-3 units/kg/hr to 1500 units/hr and check PTT in 6 hours.    Vernard GamblesVeronda Julitza Rickles, PharmD, BCPS  05/04/2018,12:57 AM

## 2018-05-04 NOTE — Progress Notes (Signed)
ANTICOAGULATION CONSULT NOTE - Initial Consult  Pharmacy Consult for heparin Indication: h/o DVT  No Known Allergies  Patient Measurements: Weight: 201 lb 1 oz (91.2 kg) Heparin Dosing Weight: 91  Vital Signs: Temp: 98.2 F (36.8 C) (11/16 0904) Temp Source: Oral (11/16 0904) BP: 120/106 (11/16 0904) Pulse Rate: 100 (11/16 0904)  Labs: Recent Labs    05/02/18 1402 05/03/18 0533 05/03/18 2212 05/04/18 0555 05/04/18 1449  HGB 9.7* 8.9*  --  9.0*  --   HCT 30.2* 28.1*  --  28.0*  --   PLT 253 256  --  320  --   APTT  --   --  53* 50* 46*  HEPARINUNFRC  --   --  >2.20*  --  1.66*  CREATININE 1.67* 1.16  --  0.85  --     Estimated Creatinine Clearance: 96.1 mL/min (by C-G formula based on SCr of 0.85 mg/dL).   Medical History: Past Medical History:  Diagnosis Date  . Hypertension   . TBI (traumatic brain injury) (HCC) 2013    Assessment: 67 yo male with h/o DVT on apixaban PTA. Last known dose of apixaban was 11/14 at 1100. MD would like to start a heparin drip for therapeutic anticoagulation while apixaban is on hold in the setting of ileus. Since patient has been on apixaban, we will use aPTT initially to titrate heparin due to apixaban's effect on heparin levels.    Aptt 46 sec, HL 1.66. HL still not correlating  Goal of Therapy:  Heparin level 0.3-0.7 units/ml  APTT 66-102 sec Monitor platelets by anticoagulation protocol: Yes   Plan:  Heparin bolus 1250 units Increase Heparin drip to 1950 units/hr APTT at 6 hours Daily heparin level and CBC, Monitor for bleeding   Brinsley Wence A. Jeanella CrazePierce, PharmD, BCPS Clinical Pharmacist [place] Pager: [phone] Please utilize Amion for appropriate phone number to reach the unit pharmacist [facility])   05/04/2018 4:11 PM

## 2018-05-05 LAB — CBC
HEMATOCRIT: 28 % — AB (ref 39.0–52.0)
Hemoglobin: 8.7 g/dL — ABNORMAL LOW (ref 13.0–17.0)
MCH: 27.4 pg (ref 26.0–34.0)
MCHC: 31.1 g/dL (ref 30.0–36.0)
MCV: 88.3 fL (ref 80.0–100.0)
NRBC: 0 % (ref 0.0–0.2)
PLATELETS: 468 10*3/uL — AB (ref 150–400)
RBC: 3.17 MIL/uL — ABNORMAL LOW (ref 4.22–5.81)
RDW: 14.6 % (ref 11.5–15.5)
WBC: 12.9 10*3/uL — AB (ref 4.0–10.5)

## 2018-05-05 LAB — CULTURE, BLOOD (ROUTINE X 2)
SPECIAL REQUESTS: ADEQUATE
Special Requests: ADEQUATE

## 2018-05-05 LAB — BASIC METABOLIC PANEL
Anion gap: 5 (ref 5–15)
BUN: 20 mg/dL (ref 8–23)
CO2: 20 mmol/L — ABNORMAL LOW (ref 22–32)
CREATININE: 0.69 mg/dL (ref 0.61–1.24)
Calcium: 8.3 mg/dL — ABNORMAL LOW (ref 8.9–10.3)
Chloride: 113 mmol/L — ABNORMAL HIGH (ref 98–111)
GFR calc Af Amer: >60 mL/min (ref >60)
GFR calc non Af Amer: >60 mL/min (ref >60)
GLUCOSE: 101 mg/dL — AB (ref 70–99)
Potassium: 3.5 mmol/L (ref 3.5–5.1)
Sodium: 138 mmol/L (ref 135–145)

## 2018-05-05 LAB — APTT
APTT: 55 s — AB (ref 24–36)
aPTT: 53 seconds — ABNORMAL HIGH (ref 24–36)
aPTT: 70 seconds — ABNORMAL HIGH (ref 24–36)

## 2018-05-05 LAB — HEPARIN LEVEL (UNFRACTIONATED)
HEPARIN UNFRACTIONATED: 1.02 [IU]/mL — AB (ref 0.30–0.70)
Heparin Unfractionated: 1.04 IU/mL — ABNORMAL HIGH (ref 0.30–0.70)

## 2018-05-05 LAB — VANCOMYCIN, TROUGH: Vancomycin Tr: 16 ug/mL (ref 15–20)

## 2018-05-05 LAB — POTASSIUM: POTASSIUM: 3.7 mmol/L (ref 3.5–5.1)

## 2018-05-05 MED ORDER — KCL IN DEXTROSE-NACL 30-5-0.45 MEQ/L-%-% IV SOLN
INTRAVENOUS | Status: DC
  Administered 2018-05-05 – 2018-05-09 (×6): via INTRAVENOUS
  Filled 2018-05-05 (×7): qty 1000

## 2018-05-05 MED ORDER — POTASSIUM CHLORIDE 10 MEQ/100ML IV SOLN
10.0000 meq | INTRAVENOUS | Status: AC
  Administered 2018-05-05 (×3): 10 meq via INTRAVENOUS
  Filled 2018-05-05 (×2): qty 100

## 2018-05-05 MED ORDER — HEPARIN BOLUS VIA INFUSION
1000.0000 [IU] | Freq: Once | INTRAVENOUS | Status: AC
  Administered 2018-05-05: 1000 [IU] via INTRAVENOUS
  Filled 2018-05-05: qty 1000

## 2018-05-05 MED ORDER — HYDRALAZINE HCL 20 MG/ML IJ SOLN
5.0000 mg | Freq: Four times a day (QID) | INTRAMUSCULAR | Status: DC | PRN
  Administered 2018-05-08 – 2018-05-16 (×2): 5 mg via INTRAVENOUS
  Filled 2018-05-05 (×2): qty 1

## 2018-05-05 NOTE — Progress Notes (Addendum)
ANTICOAGULATION CONSULT NOTE   Pharmacy Consult for heparin Indication: h/o DVT  No Known Allergies  Patient Measurements: Weight: 199 lb 8.3 oz (90.5 kg) Heparin Dosing Weight: 91  Vital Signs: Temp: 98.7 F (37.1 C) (11/17 0949) Temp Source: Oral (11/17 0949) BP: 136/108 (11/17 0949) Pulse Rate: 78 (11/17 0949)  Labs: Recent Labs    05/03/18 0533  05/04/18 0555 05/04/18 1449 05/04/18 2211 05/05/18 0358 05/05/18 1018  HGB 8.9*  --  9.0*  --   --  8.7*  --   HCT 28.1*  --  28.0*  --   --  28.0*  --   PLT 256  --  320  --   --  468*  --   APTT  --    < > 50* 46* 68* 55* 53*  HEPARINUNFRC  --    < >  --  1.66*  --  1.04* 1.02*  CREATININE 1.16  --  0.85  --   --   --  0.69   < > = values in this interval not displayed.    Estimated Creatinine Clearance: 101.8 mL/min (by C-G formula based on SCr of 0.69 mg/dL).   Medical History: Past Medical History:  Diagnosis Date  . Hypertension   . TBI (traumatic brain injury) (HCC) 2013    Assessment: 67 yo male with h/o DVT on apixaban PTA. Last known dose of apixaban was 11/14 at 1100. MD would like to start a heparin drip for therapeutic anticoagulation while apixaban is on hold in the setting of ileus. Since patient has been on apixaban, we will use aPTT initially to titrate heparin due to apixaban's effect on heparin levels.    Aptt 53 sec, HL 1.02 (not correlating). Heparin infusing fine this AM (some problems overnight). Will increase drip  Goal of Therapy:  Heparin level 0.3-0.7 units/ml  APTT 66-102 sec Monitor platelets by anticoagulation protocol: Yes   Plan:  Heparin bolus 1000 units Increase Heparin drip at 2150 units/hr APTT in 8 hours Daily heparin level, aPTT and CBC, Monitor for bleeding  Annarae Macnair A. Jeanella CrazePierce, PharmD, BCPS Clinical Pharmacist [place] Pager: [phone] Please utilize Amion for appropriate phone number to reach the unit pharmacist [facility])   05/05/2018 11:20 AM

## 2018-05-05 NOTE — Progress Notes (Signed)
ANTICOAGULATION CONSULT NOTE   Pharmacy Consult for heparin Indication: h/o DVT  No Known Allergies  Patient Measurements: Weight: 199 lb 8.3 oz (90.5 kg) Heparin Dosing Weight: 91  Vital Signs: Temp: 99.1 F (37.3 C) (11/17 2027) Temp Source: Oral (11/17 2027) BP: 126/102 (11/17 2027) Pulse Rate: 92 (11/17 2027)  Labs: Recent Labs    05/03/18 0533  05/04/18 0555 05/04/18 1449  05/05/18 0358 05/05/18 1018 05/05/18 1950  HGB 8.9*  --  9.0*  --   --  8.7*  --   --   HCT 28.1*  --  28.0*  --   --  28.0*  --   --   PLT 256  --  320  --   --  468*  --   --   APTT  --    < > 50* 46*   < > 55* 53* 70*  HEPARINUNFRC  --    < >  --  1.66*  --  1.04* 1.02*  --   CREATININE 1.16  --  0.85  --   --   --  0.69  --    < > = values in this interval not displayed.    Estimated Creatinine Clearance: 101.8 mL/min (by C-G formula based on SCr of 0.69 mg/dL).   Medical History: Past Medical History:  Diagnosis Date  . Hypertension   . TBI (traumatic brain injury) (HCC) 2013    Assessment: 67 yo male with h/o DVT on apixaban PTA. Last known dose of apixaban was 11/14 at 1100. MD would like to start a heparin drip for therapeutic anticoagulation while apixaban is on hold in the setting of ileus. Since patient has been on apixaban, we will use aPTT initially to titrate heparin due to apixaban's effect on heparin levels.    Aptt 70 sec, HL not correlating. APTT at lower end of range, will increase  Goal of Therapy:  Heparin level 0.3-0.7 units/ml  APTT 66-102 sec Monitor platelets by anticoagulation protocol: Yes   Plan:  Increase Heparin drip to 2250 units/hr APTT with AM labs Daily heparin level, aPTT and CBC, Monitor for bleeding  Shateka Petrea A. Jeanella CrazePierce, PharmD, BCPS Clinical Pharmacist [place] Pager: [phone] Please utilize Amion for appropriate phone number to reach the unit pharmacist [facility])   05/05/2018 8:43 PM

## 2018-05-05 NOTE — Progress Notes (Signed)
[facility]  Wayne Buckley 67 y.o. 10/08/1950   Subjective: Large amount of stool (1200 ml) yesterday following enema and suppository.   Objective: Vital signs: Vitals:   05/04/18 2314 05/05/18 0511  BP: (!) 136/120 (!) 156/115  Pulse: 98 85  Resp: 16 (!) 22  Temp: 98.8 F (37.1 C) 98.8 F (37.1 C)  SpO2: 98% 95%    Physical Exam: Gen: alert, no acute distress  HEENT: anicteric sclera CV: RRR Chest: CTA B Abd: less distended, nontender, +BS Ext: no edema  Lab Results: Recent Labs    05/03/18 0533 05/04/18 0555  NA 134* 136  K 3.3* 3.3*  CL 104 110  CO2 21* 20*  GLUCOSE 110* 128*  BUN 68* 46*  CREATININE 1.16 0.85  CALCIUM 8.4* 8.5*  MG 2.2  --   PHOS 2.9  --    Recent Labs    05/02/18 1402  AST 83*  ALT 36  ALKPHOS 67  BILITOT 0.5  PROT 6.1*  ALBUMIN 2.3*   Recent Labs    05/02/18 1402  05/04/18 0555 05/05/18 0358  WBC 7.8   < > 9.7 12.9*  NEUTROABS 6.5  --   --   --   HGB 9.7*   < > 9.0* 8.7*  HCT 30.2*   < > 28.0* 28.0*  MCV 88.6   < > 87.2 88.3  PLT 253   < > 320 468*   < > = values in this interval not displayed.      Assessment/Plan: Resolving ileus - replace potassium if needed (BMET pending). Repeat tap water enema today. May need repeat Xray tomorrow. Hold off on decompression since BMs moving with enemas. [facility] will f/u tomorrow.   Shirley FriarVincent C Caitlin Ainley 05/05/2018, 8:52 AM  Questions please call [phone] ID: Wayne Buckley, Wayne Buckley   DOB: 03/10/1951, 67 y.o.   MRN: [id number]

## 2018-05-05 NOTE — Final Consult Note (Signed)
Consultant Final Sign-Off Note    Assessment/Final recommendations  Wayne Buckley is a 67 y.o. male followed by me for functional colonic obstruction   Wound care (if applicable):    Diet at discharge: per primary team   Activity at discharge: per primary team            Given resolution of obstructive symptom with enemas, I do not think he needs continued surgical evaluation.  Thank you for allowing us to participate in the care of your patient!  Please consult us again if you have further needs for your patient.  De BlanchLuke Aaron Mamye Bolds 05/05/2018 10:05 AM

## 2018-05-05 NOTE — Progress Notes (Signed)
PROGRESS NOTE    Wayne Buckley  ZOX:[id number] DOB: 04/11/1951 DOA: 05/02/2018 PCP: [name], MD   Brief Narrative: Wayne Buckley is a 67 y.o. male with medical history significant of traumatic brain injury, central cord syndrome, neurogenic bladder with indwelling Foley catheter, bilateral leg weakness, prior DVT, hypertension, alcohol abuse. He presented secondary to fever. He was found to have MRSA in his urine prior to arrival. Blood cultures positive for MRSA and CT abdomen concerning for ileus.   Assessment & Plan:   Principal Problem:   Ogilvie's syndrome Active Problems:   Alcohol abuse   AKI (acute kidney injury) (HCC)   Central cord syndrome (HCC)   Essential hypertension   Sepsis (HCC)   Chronic anemia   Hyponatremia   Sacral ulcer (HCC)   History of DVT (deep vein thrombosis)   Traumatic brain injury (HCC)   MRSA bacteremia   Sepsis Secondary to UTI and MRSA bacteremia. Physiology improved.  MRSA bacteremia MRSA UTI Patient empirically treated with Vancomycin and cefepime. Transthoracic Echocardiogram without evidence of vegetations per report. -Continue Vancomycin -ID recommendations: Repeat Bcx (11/15) pending  Acute kidney injury Baseline creatinine of <1. Creatinine of 1.67 on admission. Resolved -D5 1/2 NS fluids  Ileus General surgery has evaluated. Questioning possible mechanical component vs Ogilvie syndrome. Abdomen is significantly distended. Patient has had a bowel movement today with flatus. -NPO -GI recommendations: enema, x-ray in AM -General surgery recommendations: GI; signed off.  Chronic anemia Likely chronic anemia. Slight drop in setting of fluid resuscitation. No evidence of bleeding. Stable.  History of DVT Patient is on Eliquis as an outpatient. Currently NPO. -Hold Eliquis -Continue Heparin while NPO  History of TBI -PT eval: SNF  Essential hypertension Normotensive. -Continue to hold home  antihypertensives  History of alcohol abuse Patient has been at SNF. -Discontinue CIWA  Pressure Injury Documentation: Pressure Injury 05/03/18 Deep Tissue Injury - Purple or maroon localized area of discolored intact skin or blood-filled blister due to damage of underlying soft tissue from pressure and/or shear. (Active)  05/03/18    Location: Buttocks  Location Orientation: Left;Right  Staging: Deep Tissue Injury - Purple or maroon localized area of discolored intact skin or blood-filled blister due to damage of underlying soft tissue from pressure and/or shear.  Wound Description (Comments):   Present on Admission: Yes     DVT prophylaxis: Heparin drip Code Status:   Code Status: Full Code Family Communication: [name] Disposition Plan: Discharge to SNF in several days   Consultants:   General surgery  Infectious disease  Gastroenterology  Procedures:   None  Antimicrobials:  Vancomycin (11/14>>  Cefepime (11/14)    Subjective: No abdominal pain. Bowel movement this morning.  Objective: Vitals:   05/04/18 1735 05/04/18 2314 05/05/18 0511 05/05/18 0949  BP: 138/78 (!) 136/120 (!) 156/115 (!) 136/108  Pulse: 91 98 85 78  Resp: (!) 21 16 (!) 22 (!) 21  Temp: 99.5 F (37.5 C) 98.8 F (37.1 C) 98.8 F (37.1 C) 98.7 F (37.1 C)  TempSrc: Oral Oral Oral Oral  SpO2: 96% 98% 95% 96%  Weight:  90.5 kg      Intake/Output Summary (Last 24 hours) at 05/05/2018 1215 Last data filed at 05/05/2018 0900 Gross per 24 hour  Intake 2545.7 ml  Output 2850 ml  Net -304.3 ml   Filed Weights   05/02/18 1413 05/02/18 2215 05/04/18 2314  Weight: 90.7 kg 91.2 kg 90.5 kg    Examination:  General exam:  Appears calm and comfortable Respiratory system: Clear to auscultation. Respiratory effort normal. Cardiovascular system: S1 & S2 heard, RRR. No murmurs, rubs, gallops or clicks. Gastrointestinal system: Abdomen is significantly distended, soft and nontender.  No organomegaly or masses felt. Decreased bowel sounds heard. Central nervous system: Alert and oriented. Extremities: No edema. No calf tenderness Skin: No cyanosis. No rashes Psychiatry: Judgement and insight appear normal. Mood & affect appropriate.     Data Reviewed: I have personally reviewed following labs and imaging studies  CBC: Recent Labs  Lab 05/02/18 1402 05/03/18 0533 05/04/18 0555 05/05/18 0358  WBC 7.8 7.3 9.7 12.9*  NEUTROABS 6.5  --   --   --   HGB 9.7* 8.9* 9.0* 8.7*  HCT 30.2* 28.1* 28.0* 28.0*  MCV 88.6 87.0 87.2 88.3  PLT 253 256 320 468*   Basic Metabolic Panel: Recent Labs  Lab 05/02/18 1402 05/03/18 0533 05/04/18 0555 05/05/18 1018  NA 131* 134* 136 138  K 3.4* 3.3* 3.3* 3.5  CL 102 104 110 113*  CO2 19* 21* 20* 20*  GLUCOSE 119* 110* 128* 101*  BUN 82* 68* 46* 20  CREATININE 1.67* 1.16 0.85 0.69  CALCIUM 8.2* 8.4* 8.5* 8.3*  MG  --  2.2  --   --   PHOS  --  2.9  --   --    GFR: Estimated Creatinine Clearance: 101.8 mL/min (by C-G formula based on SCr of 0.69 mg/dL). Liver Function Tests: Recent Labs  Lab 05/02/18 1402  AST 83*  ALT 36  ALKPHOS 67  BILITOT 0.5  PROT 6.1*  ALBUMIN 2.3*   No results for input(s): LIPASE, AMYLASE in the last 168 hours. No results for input(s): AMMONIA in the last 168 hours. Coagulation Profile: No results for input(s): INR, PROTIME in the last 168 hours. Cardiac Enzymes: No results for input(s): CKTOTAL, CKMB, CKMBINDEX, TROPONINI in the last 168 hours. BNP (last 3 results) No results for input(s): PROBNP in the last 8760 hours. HbA1C: No results for input(s): HGBA1C in the last 72 hours. CBG: Recent Labs  Lab 05/03/18 0823  GLUCAP 97   Lipid Profile: No results for input(s): CHOL, HDL, LDLCALC, TRIG, CHOLHDL, LDLDIRECT in the last 72 hours. Thyroid Function Tests: No results for input(s): TSH, T4TOTAL, FREET4, T3FREE, THYROIDAB in the last 72 hours. Anemia Panel: No results for  input(s): VITAMINB12, FOLATE, FERRITIN, TIBC, IRON, RETICCTPCT in the last 72 hours. Sepsis Labs: Recent Labs  Lab 05/02/18 1412 05/02/18 1644 05/02/18 2344  PROCALCITON  --   --  6.29  LATICACIDVEN 1.13 1.66  --     Recent Results (from the past 240 hour(s))  Blood Culture (routine x 2)     Status: Abnormal   Collection Time: 05/02/18  1:46 PM  Result Value Ref Range Status   Specimen Description BLOOD RIGHT ANTECUBITAL  Final   Special Requests   Final    BOTTLES DRAWN AEROBIC AND ANAEROBIC Blood Culture adequate volume   Culture  Setup Time   Final    GRAM POSITIVE COCCI IN CLUSTERS IN BOTH AEROBIC AND ANAEROBIC BOTTLES CRITICAL VALUE NOTED.  VALUE IS CONSISTENT WITH PREVIOUSLY REPORTED AND CALLED VALUE. Performed at [facility], [address] [place], [place] [address]    Culture STAPHYLOCOCCUS AUREUS (A)  Final   Report Status 05/05/2018 FINAL  Final  Blood Culture (routine x 2)     Status: Abnormal   Collection Time: 05/02/18  2:03 PM  Result Value Ref Range Status  Specimen Description BLOOD LEFT ANTECUBITAL  Final   Special Requests   Final    BOTTLES DRAWN AEROBIC AND ANAEROBIC Blood Culture adequate volume   Culture  Setup Time   Final    GRAM POSITIVE COCCI IN CLUSTERS IN BOTH AEROBIC AND ANAEROBIC BOTTLES CRITICAL RESULT CALLED TO, READ BACK BY AND VERIFIED WITH: Gay Filler PharmD 10:05 05/03/18 ([email or web address]) Performed at [facility], [address], [place], [place] [address]    Culture METHICILLIN RESISTANT STAPHYLOCOCCUS AUREUS (A)  Final   Report Status 05/05/2018 FINAL  Final   Organism ID, Bacteria METHICILLIN RESISTANT STAPHYLOCOCCUS AUREUS  Final      Susceptibility   Methicillin resistant staphylococcus aureus - MIC*    CIPROFLOXACIN >=8 RESISTANT Resistant     ERYTHROMYCIN >=8 RESISTANT Resistant     GENTAMICIN <=0.5 SENSITIVE Sensitive     OXACILLIN >=4 RESISTANT Resistant     TETRACYCLINE <=1 SENSITIVE Sensitive     VANCOMYCIN 1  SENSITIVE Sensitive     TRIMETH/SULFA >=320 RESISTANT Resistant     CLINDAMYCIN <=0.25 SENSITIVE Sensitive     RIFAMPIN <=0.5 SENSITIVE Sensitive     Inducible Clindamycin NEGATIVE Sensitive     * METHICILLIN RESISTANT STAPHYLOCOCCUS AUREUS  Blood Culture ID Panel (Reflexed)     Status: Abnormal   Collection Time: 05/02/18  2:03 PM  Result Value Ref Range Status   Enterococcus species NOT DETECTED NOT DETECTED Final   Listeria monocytogenes NOT DETECTED NOT DETECTED Final   Staphylococcus species DETECTED (A) NOT DETECTED Final    Comment: CRITICAL RESULT CALLED TO, READ BACK BY AND VERIFIED WITH: Gay Filler PharmD 10:05 05/03/18 ([email or web address])    Staphylococcus aureus (BCID) DETECTED (A) NOT DETECTED Final    Comment: Methicillin (oxacillin)-resistant Staphylococcus aureus (MRSA). MRSA is predictably resistant to beta-lactam antibiotics (except ceftaroline). Preferred therapy is vancomycin unless clinically contraindicated. Patient requires contact precautions if  hospitalized. CRITICAL RESULT CALLED TO, READ BACK BY AND VERIFIED WITH: Gay Filler PharmD 10:05 05/03/18 ([email or web address])    Methicillin resistance DETECTED (A) NOT DETECTED Final    Comment: CRITICAL RESULT CALLED TO, READ BACK BY AND VERIFIED WITH: Gay Filler PharmD 10:05 05/03/18 ([email or web address])    Streptococcus species NOT DETECTED NOT DETECTED Final   Streptococcus agalactiae NOT DETECTED NOT DETECTED Final   Streptococcus pneumoniae NOT DETECTED NOT DETECTED Final   Streptococcus pyogenes NOT DETECTED NOT DETECTED Final   Acinetobacter baumannii NOT DETECTED NOT DETECTED Final   Enterobacteriaceae species NOT DETECTED NOT DETECTED Final   Enterobacter cloacae complex NOT DETECTED NOT DETECTED Final   Escherichia coli NOT DETECTED NOT DETECTED Final   Klebsiella oxytoca NOT DETECTED NOT DETECTED Final   Klebsiella pneumoniae NOT DETECTED NOT DETECTED Final   Proteus species NOT DETECTED NOT DETECTED Final   Serratia marcescens NOT DETECTED  NOT DETECTED Final   Haemophilus influenzae NOT DETECTED NOT DETECTED Final   Neisseria meningitidis NOT DETECTED NOT DETECTED Final   Pseudomonas aeruginosa NOT DETECTED NOT DETECTED Final   Candida albicans NOT DETECTED NOT DETECTED Final   Candida glabrata NOT DETECTED NOT DETECTED Final   Candida krusei NOT DETECTED NOT DETECTED Final   Candida parapsilosis NOT DETECTED NOT DETECTED Final   Candida tropicalis NOT DETECTED NOT DETECTED Final    Comment: Performed at [facility], [address], [place], [place] [address]  MRSA PCR Screening     Status: Abnormal   Collection Time: 05/03/18  6:12 AM  Result Value Ref Range Status   MRSA  by PCR POSITIVE (A) NEGATIVE Final    Comment:        The GeneXpert MRSA Assay (FDA approved for NASAL specimens only), is one component of a comprehensive MRSA colonization surveillance program. It is not intended to diagnose MRSA infection nor to guide or monitor treatment for MRSA infections. RESULT CALLED TO, READ BACK BY AND VERIFIED WITH: Lyla Glassing RN 9:40 05/03/18 ([email or web address]) Performed at [facility], [address] [place], [place] [address]   Culture, blood (routine x 2)     Status: None (Preliminary result)   Collection Time: 05/03/18 10:01 PM  Result Value Ref Range Status   Specimen Description BLOOD RIGHT HAND  Final   Special Requests   Final    BOTTLES DRAWN AEROBIC AND ANAEROBIC Blood Culture adequate volume   Culture   Final    NO GROWTH < 24 HOURS Performed at [facility], [address], [place], [place] [address]    Report Status PENDING  Incomplete  Culture, blood (routine x 2)     Status: None (Preliminary result)   Collection Time: 05/03/18 10:09 PM  Result Value Ref Range Status   Specimen Description BLOOD LEFT HAND  Final   Special Requests   Final    BOTTLES DRAWN AEROBIC ONLY Blood Culture adequate volume   Culture   Final    NO GROWTH < 24 HOURS Performed at [facility], [address], [place], [place] [address]    Report Status PENDING  Incomplete         Radiology Studies: Dg Abd Portable 1v  Result Date: 05/04/2018 CLINICAL DATA:  Follow-up ileus EXAM: PORTABLE ABDOMEN - 1 VIEW COMPARISON:  05/02/2018 FINDINGS: Distended colon is again noted and stable consistent with generalized ileus. No definitive obstructive changes are seen. Postsurgical changes in the low pelvis are noted. Degenerative changes of the lumbar spine are seen. No free air is noted. IMPRESSION: Stable gaseous dilatation of the colon consistent with ileus. Electronically Signed   By: Alcide Clever M.D.   On: 05/04/2018 09:40        Scheduled Meds: . bisacodyl  10 mg Rectal Daily  . Chlorhexidine Gluconate Cloth  6 each Topical Q0600  . heparin  1,000 Units Intravenous Once  . liver oil-zinc oxide   Topical QID  . mupirocin ointment  1 application Nasal BID  . simethicone  40 mg Oral QID   Continuous Infusions: . dextrose 5 % and 0.45% NaCl 75 mL/hr at 05/04/18 1700  . heparin 1,950 Units/hr (05/05/18 0306)  . vancomycin 750 mg (05/05/18 0307)     LOS: 3 days     Jacquelin Hawking, MD [facility] 05/05/2018, 12:15 PM  If 7PM-7AM, please contact night-coverage [email or web address]

## 2018-05-05 NOTE — Progress Notes (Signed)
Pharmacy Antibiotic Note  Wayne Buckley is a 67 y.o. male admitted on 05/02/2018 with pneumonia/sepsis.  Pharmacy has been consulted for vancomycin/cefepime dosing.  SCr 1.67 on admit (baseline ~0.4-0.7 recently) now 0.69.    Vanc trough 16 mcg/ml (30 min late). Likely, 16-20 mcg/ml.   Plan:  Continue Vancomycin 750mg  IV q12h Monitor clinical progress, c/s, renal function F/u de-escalation plan/LOT, vancomycin troughs as indicated   Weight: 199 lb 8.3 oz (90.5 kg)  Temp (24hrs), Avg:99 F (37.2 C), Min:98.7 F (37.1 C), Max:99.5 F (37.5 C)  Recent Labs  Lab 05/02/18 1402 05/02/18 1412 05/02/18 1644 05/03/18 0533 05/04/18 0555 05/05/18 0358 05/05/18 1018 05/05/18 1528  WBC 7.8  --   --  7.3 9.7 12.9*  --   --   CREATININE 1.67*  --   --  1.16 0.85  --  0.69  --   LATICACIDVEN  --  1.13 1.66  --   --   --   --   --   VANCOTROUGH  --   --   --   --   --   --   --  16    Estimated Creatinine Clearance: 101.8 mL/min (by C-G formula based on SCr of 0.69 mg/dL).    No Known Allergies  Antimicrobials this admission: 11/14 vancomycin >>  11/14 cefepime >> 11/16  Dose adjustments this admission: None  Mynor Witkop A. Jeanella CrazePierce, PharmD, BCPS Clinical Pharmacist [place] Pager: [phone] Please utilize Amion for appropriate phone number to reach the unit pharmacist [facility])   05/05/2018 4:12 PM

## 2018-05-06 LAB — BASIC METABOLIC PANEL WITH GFR
Anion gap: 6 (ref 5–15)
BUN: 12 mg/dL (ref 8–23)
CO2: 18 mmol/L — ABNORMAL LOW (ref 22–32)
Calcium: 7.8 mg/dL — ABNORMAL LOW (ref 8.9–10.3)
Chloride: 114 mmol/L — ABNORMAL HIGH (ref 98–111)
Creatinine, Ser: 0.75 mg/dL (ref 0.61–1.24)
GFR calc Af Amer: >60 mL/min
GFR calc non Af Amer: >60 mL/min
Glucose, Bld: 107 mg/dL — ABNORMAL HIGH (ref 70–99)
Potassium: 3.8 mmol/L (ref 3.5–5.1)
Sodium: 138 mmol/L (ref 135–145)

## 2018-05-06 LAB — URINALYSIS, ROUTINE W REFLEX MICROSCOPIC
Bilirubin Urine: NEGATIVE
Glucose, UA: NEGATIVE mg/dL
HGB URINE DIPSTICK: NEGATIVE
Ketones, ur: NEGATIVE mg/dL
LEUKOCYTES UA: NEGATIVE
Nitrite: NEGATIVE
Protein, ur: NEGATIVE mg/dL
SPECIFIC GRAVITY, URINE: 1.012 (ref 1.005–1.030)
pH: 5 (ref 5.0–8.0)

## 2018-05-06 LAB — CBC
HEMATOCRIT: 26.4 % — AB (ref 39.0–52.0)
HEMOGLOBIN: 8.4 g/dL — AB (ref 13.0–17.0)
MCH: 28 pg (ref 26.0–34.0)
MCHC: 31.8 g/dL (ref 30.0–36.0)
MCV: 88 fL (ref 80.0–100.0)
Platelets: 580 10*3/uL — ABNORMAL HIGH (ref 150–400)
RBC: 3 MIL/uL — ABNORMAL LOW (ref 4.22–5.81)
RDW: 14.7 % (ref 11.5–15.5)
WBC: 12.5 10*3/uL — ABNORMAL HIGH (ref 4.0–10.5)
nRBC: 0 % (ref 0.0–0.2)

## 2018-05-06 LAB — HEPARIN LEVEL (UNFRACTIONATED)
Heparin Unfractionated: 0.39 IU/mL (ref 0.30–0.70)
Heparin Unfractionated: 0.37 IU/mL (ref 0.30–0.70)

## 2018-05-06 LAB — HEPATITIS C ANTIBODY: HCV Ab: 0.2 s/co ratio (ref 0.0–0.9)

## 2018-05-06 LAB — POTASSIUM: Potassium: 3.9 mmol/L (ref 3.5–5.1)

## 2018-05-06 LAB — APTT: aPTT: 87 s — ABNORMAL HIGH (ref 24–36)

## 2018-05-06 MED ORDER — COLLAGENASE 250 UNIT/GM EX OINT
TOPICAL_OINTMENT | Freq: Every day | CUTANEOUS | Status: AC
  Administered 2018-05-06 – 2018-05-19 (×13): via TOPICAL
  Filled 2018-05-06: qty 30

## 2018-05-06 MED ORDER — POTASSIUM CHLORIDE 10 MEQ/100ML IV SOLN
10.0000 meq | INTRAVENOUS | Status: AC
  Administered 2018-05-06 (×4): 10 meq via INTRAVENOUS
  Filled 2018-05-06 (×4): qty 100

## 2018-05-06 NOTE — Evaluation (Signed)
Clinical/Bedside Swallow Evaluation Patient Details  Name: Wayne Buckley MRN: [id number] Date of Birth: 10-Aug-1950  Today's Date: 05/06/2018 Time: SLP Start Time (ACUTE ONLY): 1450 SLP Stop Time (ACUTE ONLY): 1502 SLP Time Calculation (min) (ACUTE ONLY): 12 min  Past Medical History:  Past Medical History:  Diagnosis Date  . Hypertension   . TBI (traumatic brain injury) (HCC) 2013   Past Surgical History:  Past Surgical History:  Procedure Laterality Date  . ANTERIOR CERVICAL DECOMP/DISCECTOMY FUSION N/A 02/27/2018   Procedure: ANTERIOR CERVICAL DECOMPRESSION/DISCECTOMY FUSION C6-7;  Surgeon: Donalee Citrin, MD;  Location[facility];  Service: Neurosurgery;  Laterality: N/A;  . CRANIOTOMY  04/28/2012   Procedure: CRANIOTOMY HEMATOMA EVACUATION SUBDURAL;  Surgeon: Reinaldo Meeker, MD;  Location: [facility] OR;  Service: Neurosurgery;  Laterality: Left;  Left Craniotomy for Subdural Hematoma  . RADIOLOGY WITH ANESTHESIA N/A 02/27/2018   Procedure: MRI WITH ANESTHESIA;  Surgeon: Radiologist, Medication, MD;  Location: [facility] OR;  Service: Radiology;  Laterality: N/A;  . VENA CAVA FILTER PLACEMENT Right 02/27/2018   Procedure: INSERTION VENA-CAVA FILTER;  Surgeon: Nada Libman, MD;  Location: [facility] OR;  Service: Vascular;  Laterality: Right;   HPI:  Pt is a 67 y/o male with a PMH signficant for TBI, central cord syndrome, neurogenic bladder with indwelling foley catheter, BLE weakness, prior DVT, HTN, alcohol abuse presenting to the hospital for evaluation of fever. In ED, urine culture grew MRSA and CT abdomen revealed possible large bowel dysmotility or ileus.  Pt has been followed by SLP service during prior admissions, most recently with CIR stay and SLP tx focusing on cognition and dysphagia.  Pt D/Cd on a dysphagia 3 diet, thin liquids with primary barrier to safety being impulsivity, poor self-awareness.  His wife reports upgrade to regular diet and thin liquids at [facility].   Assessment / Plan /  Recommendation Clinical Impression  Pt presents with oropharyngeal swallow function consistent with baseline per wife's report and chart review (comparison with results from 02/28/18 clinical evaluation).  Despite nearly constant choreoathetosis of bilateral face/tongue musculature, pt is able to achieve functional mastication and swallows with no s/s of aspiration.  Barriers to safety continue to be behavioral and related to impulsivity and poor self-regulation.  His wife is knowledge and has been managing his behavioral symptoms secondary to TBI for many years.  At this time, recommend resuming a regular diet, thin liquids; assist with self-feeding as needed.  Meds whole in liquid.  No acute SLP f/u is warranted - our service will sign off.  SLP Visit Diagnosis: Dysphagia, unspecified (R13.10)    Aspiration Risk       Diet Recommendation   regular solids, thin liquids  Medication Administration: Whole meds with liquid    Other  Recommendations Oral Care Recommendations: Oral care BID   Follow up Recommendations        Frequency and Duration            Prognosis        Swallow Study   General Date of Onset: 05/02/18 HPI: Pt is a 67 y/o male with a PMH signficant for TBI, central cord syndrome, neurogenic bladder with indwelling foley catheter, BLE weakness, prior DVT, HTN, alcohol abuse presenting to the hospital for evaluation of fever. In ED, urine culture grew MRSA and CT abdomen revealed possible large bowel dysmotility or ileus.  Pt has been followed by SLP service during prior admissions, most recently with CIR stay and SLP tx focusing on cognition  and dysphagia.  Pt D/Cd on a dysphagia 3 diet, thin liquids with primary barrier to safety being impulsivity, poor self-awareness.  Type of Study: Bedside Swallow Evaluation Previous Swallow Assessment: see HPI Diet Prior to this Study: NPO Temperature Spikes Noted: No Respiratory Status: Room air History of Recent Intubation:  No Behavior/Cognition: Alert;Cooperative Oral Cavity Assessment: Within Functional Limits Oral Care Completed by SLP: No Oral Cavity - Dentition: Missing dentition Vision: Functional for self-feeding Self-Feeding Abilities: Needs assist Patient Positioning: Upright in chair Baseline Vocal Quality: Normal Volitional Cough: Strong Volitional Swallow: Able to elicit    Oral/Motor/Sensory Function Overall Oral Motor/Sensory Function: Other (comment)(choreoathetosis of facial musculature bilaterally)   Ice Chips Ice chips: Within functional limits   Thin Liquid Thin Liquid: Within functional limits    Nectar Thick Nectar Thick Liquid: Not tested   Honey Thick Honey Thick Liquid: Not tested   Puree Puree: Within functional limits   Solid     Solid: Within functional limits      Blenda MountsCouture, Leonora Gores Laurice 05/06/2018,3:17 PM   Marchelle FolksAmanda L. Samson Fredericouture, MA CCC/SLP Acute Rehabilitation Services Office number [phone] Pager [phone]

## 2018-05-06 NOTE — Progress Notes (Signed)
Rehab Admissions Coordinator Note:  Patient was screened by Trish MageLogue, Ayiana Winslett M for appropriateness for an Inpatient Acute Rehab Consult.  Noted OT recommending CIR.  At this time, we are recommending Inpatient Rehab consult.  Trish MageLogue, Noami Bove M 05/06/2018, 2:18 PM  I can be reached at [phone].

## 2018-05-06 NOTE — Progress Notes (Signed)
[facility]  Date of Admission:  05/02/2018     Total days of antibiotics 5         ASSESSMENT/PLAN  [name] continues to receive treatment for MRSA bacteremia of unclear source. Sacral wound remains a possibility. TTE without vegetation and awaiting TEE. He does have an indwelling cathter which may also be considered. Otherwise abdominal pain has improved with bowel movements and no other clear source.  He has remained afebrile and repeat blood cultures have been without growth to date. Length of treatment pending TEE results although will require prolonged therapy. . Vancomycin is therapeutic and renal function is stable.   1. Continue current dose of vancomycin. 2. Await TEE results. 3. Continue to monitor fevers, WBC count, and cultures.  4. Wound care per [facility]WOC RN recommendations.    Principal Problem:   Ogilvie's syndrome Active Problems:   Alcohol abuse   AKI (acute kidney injury) (HCC)   Central cord syndrome (HCC)   Essential hypertension   Sepsis (HCC)   Chronic anemia   Hyponatremia   Sacral ulcer (HCC)   History of DVT (deep vein thrombosis)   Traumatic brain injury (HCC)   MRSA bacteremia   . bisacodyl  10 mg Rectal Daily  . Chlorhexidine Gluconate Cloth  6 each Topical Q0600  . collagenase   Topical Daily  . liver oil-zinc oxide   Topical QID  . mupirocin ointment  1 application Nasal BID  . simethicone  40 mg Oral QID    SUBJECTIVE:  Afebrile overnight with stable WBC count of 12.5. Having increased productive bowel movements over the past 24 hours. WOC RN evaluated wound with worsening secondary to incontinence and increased bowel movements.   Denies fevers, chills or sweats. Doing okay. Wife is present in the room today.    No Known Allergies   Review of Systems: Review of Systems  Constitutional: Negative for chills and fever.  Respiratory: Negative for cough, shortness of breath and wheezing.   Cardiovascular:  Negative for chest pain and leg swelling.  Gastrointestinal: Negative for abdominal pain, constipation, diarrhea, nausea and vomiting.  Genitourinary: Negative for frequency, hematuria and urgency.  Skin: Negative for rash.      OBJECTIVE: Vitals:   05/05/18 0949 05/05/18 2027 05/06/18 0616 05/06/18 0930  BP: (!) 136/108 (!) 126/102 (!) 122/104 (!) 144/79  Pulse: 78 92 86 87  Resp: (!) 21 20 (!) 21 18  Temp: 98.7 F (37.1 C) 99.1 F (37.3 C) 98.9 F (37.2 C) 97.9 F (36.6 C)  TempSrc: Oral Oral Oral Oral  SpO2: 96% 96% 95% 99%  Weight:       Body mass index is 27.83 kg/m.  Physical Exam  Constitutional: He appears well-developed and well-nourished. No distress.  Lying in bed with head of bed slightly elevated.   Cardiovascular: Normal rate, regular rhythm, normal heart sounds and intact distal pulses.  Pulmonary/Chest: Effort normal and breath sounds normal. He has no wheezes. He exhibits no tenderness.  Abdominal: Soft. Bowel sounds are normal. He exhibits no mass. There is no tenderness. There is no guarding.  Neurological: He is alert.  Skin: Skin is warm and dry.  Psychiatric: He has a normal mood and affect.    Lab Results Lab Results  Component Value Date   WBC 12.5 (H) 05/06/2018   HGB 8.4 (L) 05/06/2018   HCT 26.4 (L) 05/06/2018   MCV 88.0 05/06/2018   PLT 580 (H) 05/06/2018    Lab  Results  Component Value Date   CREATININE 0.75 05/06/2018   BUN 12 05/06/2018   NA 138 05/06/2018   K 3.8 05/06/2018   CL 114 (H) 05/06/2018   CO2 18 (L) 05/06/2018    Lab Results  Component Value Date   ALT 36 05/02/2018   AST 83 (H) 05/02/2018   ALKPHOS 67 05/02/2018   BILITOT 0.5 05/02/2018     Microbiology: Recent Results (from the past 240 hour(s))  Blood Culture (routine x 2)     Status: Abnormal   Collection Time: 05/02/18  1:46 PM  Result Value Ref Range Status   Specimen Description BLOOD RIGHT ANTECUBITAL  Final   Special Requests   Final    BOTTLES  DRAWN AEROBIC AND ANAEROBIC Blood Culture adequate volume   Culture  Setup Time   Final    GRAM POSITIVE COCCI IN CLUSTERS IN BOTH AEROBIC AND ANAEROBIC BOTTLES CRITICAL VALUE NOTED.  VALUE IS CONSISTENT WITH PREVIOUSLY REPORTED AND CALLED VALUE. Performed at [facility], [address] [place], [place] [address]    Culture STAPHYLOCOCCUS AUREUS (A)  Final   Report Status 05/05/2018 FINAL  Final  Blood Culture (routine x 2)     Status: Abnormal   Collection Time: 05/02/18  2:03 PM  Result Value Ref Range Status   Specimen Description BLOOD LEFT ANTECUBITAL  Final   Special Requests   Final    BOTTLES DRAWN AEROBIC AND ANAEROBIC Blood Culture adequate volume   Culture  Setup Time   Final    GRAM POSITIVE COCCI IN CLUSTERS IN BOTH AEROBIC AND ANAEROBIC BOTTLES CRITICAL RESULT CALLED TO, READ BACK BY AND VERIFIED WITH: Gay Filler PharmD 10:05 05/03/18 ([email or web address]) Performed at [facility], [address], [place], [place] [address]    Culture METHICILLIN RESISTANT STAPHYLOCOCCUS AUREUS (A)  Final   Report Status 05/05/2018 FINAL  Final   Organism ID, Bacteria METHICILLIN RESISTANT STAPHYLOCOCCUS AUREUS  Final      Susceptibility   Methicillin resistant staphylococcus aureus - MIC*    CIPROFLOXACIN >=8 RESISTANT Resistant     ERYTHROMYCIN >=8 RESISTANT Resistant     GENTAMICIN <=0.5 SENSITIVE Sensitive     OXACILLIN >=4 RESISTANT Resistant     TETRACYCLINE <=1 SENSITIVE Sensitive     VANCOMYCIN 1 SENSITIVE Sensitive     TRIMETH/SULFA >=320 RESISTANT Resistant     CLINDAMYCIN <=0.25 SENSITIVE Sensitive     RIFAMPIN <=0.5 SENSITIVE Sensitive     Inducible Clindamycin NEGATIVE Sensitive     * METHICILLIN RESISTANT STAPHYLOCOCCUS AUREUS  Blood Culture ID Panel (Reflexed)     Status: Abnormal   Collection Time: 05/02/18  2:03 PM  Result Value Ref Range Status   Enterococcus species NOT DETECTED NOT DETECTED Final   Listeria monocytogenes NOT DETECTED NOT DETECTED Final    Staphylococcus species DETECTED (A) NOT DETECTED Final    Comment: CRITICAL RESULT CALLED TO, READ BACK BY AND VERIFIED WITH: Gay Filler PharmD 10:05 05/03/18 ([email or web address])    Staphylococcus aureus (BCID) DETECTED (A) NOT DETECTED Final    Comment: Methicillin (oxacillin)-resistant Staphylococcus aureus (MRSA). MRSA is predictably resistant to beta-lactam antibiotics (except ceftaroline). Preferred therapy is vancomycin unless clinically contraindicated. Patient requires contact precautions if  hospitalized. CRITICAL RESULT CALLED TO, READ BACK BY AND VERIFIED WITH: Gay Filler PharmD 10:05 05/03/18 ([email or web address])    Methicillin resistance DETECTED (A) NOT DETECTED Final    Comment: CRITICAL RESULT CALLED TO, READ BACK BY AND VERIFIED WITH: Gay Filler PharmD 10:05 05/03/18 (  wilsonm)    Streptococcus species NOT DETECTED NOT DETECTED Final   Streptococcus agalactiae NOT DETECTED NOT DETECTED Final   Streptococcus pneumoniae NOT DETECTED NOT DETECTED Final   Streptococcus pyogenes NOT DETECTED NOT DETECTED Final   Acinetobacter baumannii NOT DETECTED NOT DETECTED Final   Enterobacteriaceae species NOT DETECTED NOT DETECTED Final   Enterobacter cloacae complex NOT DETECTED NOT DETECTED Final   Escherichia coli NOT DETECTED NOT DETECTED Final   Klebsiella oxytoca NOT DETECTED NOT DETECTED Final   Klebsiella pneumoniae NOT DETECTED NOT DETECTED Final   Proteus species NOT DETECTED NOT DETECTED Final   Serratia marcescens NOT DETECTED NOT DETECTED Final   Haemophilus influenzae NOT DETECTED NOT DETECTED Final   Neisseria meningitidis NOT DETECTED NOT DETECTED Final   Pseudomonas aeruginosa NOT DETECTED NOT DETECTED Final   Candida albicans NOT DETECTED NOT DETECTED Final   Candida glabrata NOT DETECTED NOT DETECTED Final   Candida krusei NOT DETECTED NOT DETECTED Final   Candida parapsilosis NOT DETECTED NOT DETECTED Final   Candida tropicalis NOT DETECTED NOT DETECTED Final    Comment: Performed at [facility], [address], [place], [place] [address]  MRSA PCR Screening     Status: Abnormal   Collection Time: 05/03/18  6:12 AM  Result Value Ref Range Status   MRSA by PCR POSITIVE (A) NEGATIVE Final    Comment:        The GeneXpert MRSA Assay (FDA approved for NASAL specimens only), is one component of a comprehensive MRSA colonization surveillance program. It is not intended to diagnose MRSA infection nor to guide or monitor treatment for MRSA infections. RESULT CALLED TO, READ BACK BY AND VERIFIED WITH: Lyla Glassing RN 9:40 05/03/18 ([email or web address]) Performed at [facility], [address], [place], [place] [address]   Culture, blood (routine x 2)     Status: None (Preliminary result)   Collection Time: 05/03/18 10:01 PM  Result Value Ref Range Status   Specimen Description BLOOD RIGHT HAND  Final   Special Requests   Final    BOTTLES DRAWN AEROBIC AND ANAEROBIC Blood Culture adequate volume   Culture   Final    NO GROWTH 3 DAYS Performed at [facility], [address] [place], [place] [address]    Report Status PENDING  Incomplete  Culture, blood (routine x 2)     Status: None (Preliminary result)   Collection Time: 05/03/18 10:09 PM  Result Value Ref Range Status   Specimen Description BLOOD LEFT HAND  Final   Special Requests   Final    BOTTLES DRAWN AEROBIC ONLY Blood Culture adequate volume   Culture   Final    NO GROWTH 3 DAYS Performed at [facility], [address], [place], [place] [address]    Report Status PENDING  Incomplete     Marcos Eke, NP [facility] [phone] Pager  05/06/2018  1:14 PM

## 2018-05-06 NOTE — Evaluation (Signed)
Occupational Therapy Evaluation Patient Details Name: Wayne Buckley Supak MRN: [id number] DOB: 11/26/1950 Today's Date: 05/06/2018    History of Present Illness Pt is a 67 y/o male with a PMH signficant for TBI, central cord syndrome, neurogenic bladder with indwelling foley catheter, BLE weakness, prior DVT, HTN, alcohol abuse presenting to the hospital for evaluation of fever. In ED, urine culture grew MRSA and CT abdomen revealed possible large bowel dysmotility or ileus.    Clinical Impression   This 67 yo male admitted with above presents to acute OT more willing to participate in therapy today with wife present. Pt agreeable to OOB. Pt is Mod A for rolling in bed and can do some of his basic ADLs but this is complicated by ataxic UEs. He will continue to benefit from acute OT with pt and wife wanting to try CIR again. We will continue to follow.    Follow Up Recommendations  CIR;Supervision/Assistance - 24 hour    Equipment Recommendations  Other (comment)(TBD at next venue)       Precautions / Restrictions Precautions Precautions: Fall Restrictions Weight Bearing Restrictions: No      Mobility Bed Mobility Overal bed mobility: Needs Assistance Bed Mobility: Rolling Rolling: Mod assist         General bed mobility comments: increased time to reach with arms for rails  Transfers Overall transfer level: Needs assistance                    Balance Overall balance assessment: Needs assistance Sitting-balance support: Bilateral upper extremity supported;Feet supported Sitting balance-[name] Scale: Zero Sitting balance - Comments: Can pull himself foreward in recliner with arms of recliner, but cannot maintain without UE support                                   ADL either performed or assessed with clinical judgement   ADL Overall ADL's : Needs assistance/impaired Eating/Feeding: Supervision/ safety;Set up;Bed level Eating/Feeding Details (indicate  cue type and reason): supported sitting with adequate positioning Grooming: Modified independent;Bed level Grooming Details (indicate cue type and reason): with adequate positioning Upper Body Bathing: Moderate assistance Upper Body Bathing Details (indicate cue type and reason): with adequate positioning Lower Body Bathing: Maximal assistance;Bed level   Upper Body Dressing : Maximal assistance Upper Body Dressing Details (indicate cue type and reason): supported sitting Lower Body Dressing: Total assistance;Bed level                       Vision Patient Visual Report: No change from baseline              Pertinent Vitals/Pain Pain Assessment: Faces Faces Pain Scale: Hurts little more Pain Location: with any movement pt groaning out (wife seems to think it is because he is stiff from laying alot) Pain Descriptors / Indicators: Sore Pain Intervention(s): Limited activity within patient's tolerance;Monitored during session;Repositioned     Hand Dominance Right   Extremity/Trunk Assessment Upper Extremity Assessment Upper Extremity Assessment: RUE deficits/detail;LUE deficits/detail RUE Deficits / Details: ataxia and movements at rest as well RUE Coordination: decreased fine motor;decreased gross motor LUE Deficits / Details: ataxia and movements at rest as well LUE Coordination: decreased gross motor;decreased fine motor           Communication Communication Communication: Expressive difficulties   Cognition Arousal/Alertness: Awake/alert Behavior During Therapy: Restless Overall Cognitive Status: History of cognitive impairments -  at baseline                                                Home Living Family/patient expects to be discharged to:: Inpatient rehab                                 Additional Comments: Pt was on CIR then went to SNF after previous fall this year      Prior Functioning/Environment Level of  Independence: Needs assistance  Gait / Transfers Assistance Needed: Per wife pt was ambulating with RW and +2 A at [facility] ADL's / Homemaking Assistance Needed: A'ing with some of his ADLs at [facility]            OT Problem List: Decreased strength;Decreased range of motion;Impaired balance (sitting and/or standing);Pain;Decreased safety awareness;Decreased cognition;Impaired UE functional use;Decreased coordination;Decreased knowledge of use of DME or AE      OT Treatment/Interventions: Self-care/ADL training;Patient/family education;Therapeutic activities;Therapeutic exercise;Balance training;DME and/or AE instruction    OT Goals(Current goals can be found in the care plan section) Acute Rehab OT Goals Patient Stated Goal: to go back to rehab OT Goal Formulation: With patient/family Time For Goal Achievement: 05/20/18 Potential to Achieve Goals: Good  OT Frequency: Min 2X/week   Barriers to D/C: Decreased caregiver support             AM-PAC PT "6 Clicks" Daily Activity     Outcome Measure Help from another person eating meals?: A Little Help from another person taking care of personal grooming?: A Little Help from another person toileting, which includes using toliet, bedpan, or urinal?: Total Help from another person bathing (including washing, rinsing, drying)?: A Lot Help from another person to put on and taking off regular upper body clothing?: A Lot Help from another person to put on and taking off regular lower body clothing?: Total 6 Click Score: 12   End of Session Nurse Communication: Need for lift equipment(when wife leaves she will let RN or NT know and then the patient can sit up for another hour before back to bed)  Activity Tolerance: Patient tolerated treatment well Patient left: in chair;with call bell/phone within reach;with chair alarm set;with family/visitor present  OT Visit Diagnosis: Unsteadiness on feet (R26.81);Other abnormalities of gait and  mobility (R26.89);Muscle weakness (generalized) (M62.81);History of falling (Z91.81);Pain;Ataxia, unspecified (R27.0);Other symptoms and signs involving cognitive function Pain - part of body: (all over intermittently with movement)                Time: 1103-1200 OT Time Calculation (min): 57 min Charges:  OT Evaluation $OT Eval Moderate Complexity: 1 Mod OT Treatments $Therapeutic Activity: 38-52 mins  Ignacia Palma, OTR/L Acute [employer] Pager [phone] Office [phone]     Evette Georges 05/06/2018, 1:29 PM

## 2018-05-06 NOTE — Progress Notes (Signed)
PROGRESS NOTE    CONNELLY NETTERVILLE  UJW:[id number] DOB: 1950/12/23 DOA: 05/02/2018 PCP: [name], MD   Brief Narrative: Wayne Buckley is a 67 y.o. male with medical history significant of traumatic brain injury, central cord syndrome, neurogenic bladder with indwelling Foley catheter, bilateral leg weakness, prior DVT, hypertension, alcohol abuse. He presented secondary to fever. He was found to have MRSA in his urine prior to arrival. Blood cultures positive for MRSA and CT abdomen concerning for ileus.   Assessment & Plan:   Principal Problem:   Ogilvie's syndrome Active Problems:   Alcohol abuse   AKI (acute kidney injury) (HCC)   Central cord syndrome (HCC)   Essential hypertension   Sepsis (HCC)   Chronic anemia   Hyponatremia   Sacral ulcer (HCC)   History of DVT (deep vein thrombosis)   Traumatic brain injury (HCC)   MRSA bacteremia   Sepsis Secondary to UTI and MRSA bacteremia. Physiology improved.  MRSA bacteremia MRSA UTI Patient empirically treated with Vancomycin and cefepime. Transthoracic Echocardiogram without evidence of vegetations per report. Repeat Bcx (11/15) no growth to date -Continue Vancomycin -ID recommendations  Acute kidney injury Baseline creatinine of <1. Creatinine of 1.67 on admission. Resolved -D5 1/2 NS fluids  Ileus General surgery has evaluated. Questioning possible mechanical component vs Ogilvie syndrome. Abdomen is significantly distended. Patient has had a bowel movement today with flatus. -NPO -GI recommendations: enemas/suppositories. SLP consult prior to diet -General surgery recommendations: GI; signed off.  Chronic anemia Likely chronic anemia. Slight drop in setting of fluid resuscitation. No evidence of bleeding. Stable.  History of DVT Patient is on Eliquis as an outpatient. Currently NPO. -Hold Eliquis until able to take food by mouth -Continue Heparin while NPO  History of TBI -PT eval: SNF  Essential  hypertension Normotensive. -Continue to hold home antihypertensives  History of alcohol abuse Patient has been at [facility]. Discontinued CIWA  Pressure Injury Documentation: Pressure Injury 05/03/18 Deep Tissue Injury - Purple or maroon localized area of discolored intact skin or blood-filled blister due to damage of underlying soft tissue from pressure and/or shear. (Active)  05/03/18    Location: Buttocks  Location Orientation: Left;Right  Staging: Deep Tissue Injury - Purple or maroon localized area of discolored intact skin or blood-filled blister due to damage of underlying soft tissue from pressure and/or shear.  Wound Description (Comments):   Present on Admission: Yes     DVT prophylaxis: Heparin drip Code Status:   Code Status: Full Code Family Communication: [name] Disposition Plan: Discharge to SNF in several days   Consultants:   General surgery  Infectious disease  Gastroenterology  Procedures:   Transthoracic Echocardiogram (11/16) Study Conclusions  - Left ventricle: The cavity size was normal. Systolic function was   normal. The estimated ejection fraction was in the range of 60%   to 65%. Wall motion was normal; there were no regional wall   motion abnormalities. - Left atrium: The atrium was mildly dilated. - Atrial septum: There was increased thickness of the septum,   consistent with lipomatous hypertrophy. - Pulmonary arteries: Systolic pressure could not be accurately   estimated.  Antimicrobials:  Vancomycin (11/14>>  Cefepime (11/14)    Subjective: No abdominal pain. Multiple bowel movements.  Objective: Vitals:   05/05/18 0949 05/05/18 2027 05/06/18 0616 05/06/18 0930  BP: (!) 136/108 (!) 126/102 (!) 122/104 (!) 144/79  Pulse: 78 92 86 87  Resp: (!) 21 20 (!) 21 18  Temp: 98.7 F (  37.1 C) 99.1 F (37.3 C) 98.9 F (37.2 C) 97.9 F (36.6 C)  TempSrc: Oral Oral Oral Oral  SpO2: 96% 96% 95% 99%  Weight:         Intake/Output Summary (Last 24 hours) at 05/06/2018 1110 Last data filed at 05/06/2018 0616 Gross per 24 hour  Intake 2087.46 ml  Output 1680 ml  Net 407.46 ml   Filed Weights   05/02/18 1413 05/02/18 2215 05/04/18 2314  Weight: 90.7 kg 91.2 kg 90.5 kg    Examination:  General exam: Appears calm and comfortable Respiratory system: Clear to auscultation. Respiratory effort normal. Cardiovascular system: S1 & S2 heard, RRR. No murmurs, rubs, gallops or clicks. Gastrointestinal system: Abdomen is distended, soft and nontender. Normal bowel sounds heard. Central nervous system: Alert and oriented. No focal neurological deficits. Extremities: No edema. No calf tenderness Skin: No cyanosis. No rashes Psychiatry: Judgement and insight appear normal. Mood & affect appropriate.     Data Reviewed: I have personally reviewed following labs and imaging studies  CBC: Recent Labs  Lab 05/02/18 1402 05/03/18 0533 05/04/18 0555 05/05/18 0358 05/06/18 0538  WBC 7.8 7.3 9.7 12.9* 12.5*  NEUTROABS 6.5  --   --   --   --   HGB 9.7* 8.9* 9.0* 8.7* 8.4*  HCT 30.2* 28.1* 28.0* 28.0* 26.4*  MCV 88.6 87.0 87.2 88.3 88.0  PLT 253 256 320 468* 580*   Basic Metabolic Panel: Recent Labs  Lab 05/02/18 1402 05/03/18 0533 05/04/18 0555 05/05/18 1018 05/05/18 1950 05/06/18 0538  NA 131* 134* 136 138  --  138  K 3.4* 3.3* 3.3* 3.5 3.7 3.8  CL 102 104 110 113*  --  114*  CO2 19* 21* 20* 20*  --  18*  GLUCOSE 119* 110* 128* 101*  --  107*  BUN 82* 68* 46* 20  --  12  CREATININE 1.67* 1.16 0.85 0.69  --  0.75  CALCIUM 8.2* 8.4* 8.5* 8.3*  --  7.8*  MG  --  2.2  --   --   --   --   PHOS  --  2.9  --   --   --   --    GFR: Estimated Creatinine Clearance: 101.8 mL/min (by C-G formula based on SCr of 0.75 mg/dL). Liver Function Tests: Recent Labs  Lab 05/02/18 1402  AST 83*  ALT 36  ALKPHOS 67  BILITOT 0.5  PROT 6.1*  ALBUMIN 2.3*   No results for input(s): LIPASE, AMYLASE in  the last 168 hours. No results for input(s): AMMONIA in the last 168 hours. Coagulation Profile: No results for input(s): INR, PROTIME in the last 168 hours. Cardiac Enzymes: No results for input(s): CKTOTAL, CKMB, CKMBINDEX, TROPONINI in the last 168 hours. BNP (last 3 results) No results for input(s): PROBNP in the last 8760 hours. HbA1C: No results for input(s): HGBA1C in the last 72 hours. CBG: Recent Labs  Lab 05/03/18 0823  GLUCAP 97   Lipid Profile: No results for input(s): CHOL, HDL, LDLCALC, TRIG, CHOLHDL, LDLDIRECT in the last 72 hours. Thyroid Function Tests: No results for input(s): TSH, T4TOTAL, FREET4, T3FREE, THYROIDAB in the last 72 hours. Anemia Panel: No results for input(s): VITAMINB12, FOLATE, FERRITIN, TIBC, IRON, RETICCTPCT in the last 72 hours. Sepsis Labs: Recent Labs  Lab 05/02/18 1412 05/02/18 1644 05/02/18 2344  PROCALCITON  --   --  6.29  LATICACIDVEN 1.13 1.66  --     Recent Results (from the past 240 hour(s))  Blood Culture (routine x 2)     Status: Abnormal   Collection Time: 05/02/18  1:46 PM  Result Value Ref Range Status   Specimen Description BLOOD RIGHT ANTECUBITAL  Final   Special Requests   Final    BOTTLES DRAWN AEROBIC AND ANAEROBIC Blood Culture adequate volume   Culture  Setup Time   Final    GRAM POSITIVE COCCI IN CLUSTERS IN BOTH AEROBIC AND ANAEROBIC BOTTLES CRITICAL VALUE NOTED.  VALUE IS CONSISTENT WITH PREVIOUSLY REPORTED AND CALLED VALUE. Performed at [facility], [address], [place], [place] [address]    Culture STAPHYLOCOCCUS AUREUS (A)  Final   Report Status 05/05/2018 FINAL  Final  Blood Culture (routine x 2)     Status: Abnormal   Collection Time: 05/02/18  2:03 PM  Result Value Ref Range Status   Specimen Description BLOOD LEFT ANTECUBITAL  Final   Special Requests   Final    BOTTLES DRAWN AEROBIC AND ANAEROBIC Blood Culture adequate volume   Culture  Setup Time   Final    GRAM POSITIVE COCCI IN  CLUSTERS IN BOTH AEROBIC AND ANAEROBIC BOTTLES CRITICAL RESULT CALLED TO, READ BACK BY AND VERIFIED WITH: Gay Filler PharmD 10:05 05/03/18 ([email or web address]) Performed at [facility], [address] [place], [place] [address]    Culture METHICILLIN RESISTANT STAPHYLOCOCCUS AUREUS (A)  Final   Report Status 05/05/2018 FINAL  Final   Organism ID, Bacteria METHICILLIN RESISTANT STAPHYLOCOCCUS AUREUS  Final      Susceptibility   Methicillin resistant staphylococcus aureus - MIC*    CIPROFLOXACIN >=8 RESISTANT Resistant     ERYTHROMYCIN >=8 RESISTANT Resistant     GENTAMICIN <=0.5 SENSITIVE Sensitive     OXACILLIN >=4 RESISTANT Resistant     TETRACYCLINE <=1 SENSITIVE Sensitive     VANCOMYCIN 1 SENSITIVE Sensitive     TRIMETH/SULFA >=320 RESISTANT Resistant     CLINDAMYCIN <=0.25 SENSITIVE Sensitive     RIFAMPIN <=0.5 SENSITIVE Sensitive     Inducible Clindamycin NEGATIVE Sensitive     * METHICILLIN RESISTANT STAPHYLOCOCCUS AUREUS  Blood Culture ID Panel (Reflexed)     Status: Abnormal   Collection Time: 05/02/18  2:03 PM  Result Value Ref Range Status   Enterococcus species NOT DETECTED NOT DETECTED Final   Listeria monocytogenes NOT DETECTED NOT DETECTED Final   Staphylococcus species DETECTED (A) NOT DETECTED Final    Comment: CRITICAL RESULT CALLED TO, READ BACK BY AND VERIFIED WITH: Gay Filler PharmD 10:05 05/03/18 ([email or web address])    Staphylococcus aureus (BCID) DETECTED (A) NOT DETECTED Final    Comment: Methicillin (oxacillin)-resistant Staphylococcus aureus (MRSA). MRSA is predictably resistant to beta-lactam antibiotics (except ceftaroline). Preferred therapy is vancomycin unless clinically contraindicated. Patient requires contact precautions if  hospitalized. CRITICAL RESULT CALLED TO, READ BACK BY AND VERIFIED WITH: Gay Filler PharmD 10:05 05/03/18 ([email or web address])    Methicillin resistance DETECTED (A) NOT DETECTED Final    Comment: CRITICAL RESULT CALLED TO, READ BACK BY AND VERIFIED WITH: Gay Filler PharmD 10:05 05/03/18 ([email or web address])    Streptococcus species NOT DETECTED NOT DETECTED Final   Streptococcus agalactiae NOT DETECTED NOT DETECTED Final   Streptococcus pneumoniae NOT DETECTED NOT DETECTED Final   Streptococcus pyogenes NOT DETECTED NOT DETECTED Final   Acinetobacter baumannii NOT DETECTED NOT DETECTED Final   Enterobacteriaceae species NOT DETECTED NOT DETECTED Final   Enterobacter cloacae complex NOT DETECTED NOT DETECTED Final   Escherichia coli NOT DETECTED NOT DETECTED Final   Klebsiella oxytoca NOT  DETECTED NOT DETECTED Final   Klebsiella pneumoniae NOT DETECTED NOT DETECTED Final   Proteus species NOT DETECTED NOT DETECTED Final   Serratia marcescens NOT DETECTED NOT DETECTED Final   Haemophilus influenzae NOT DETECTED NOT DETECTED Final   Neisseria meningitidis NOT DETECTED NOT DETECTED Final   Pseudomonas aeruginosa NOT DETECTED NOT DETECTED Final   Candida albicans NOT DETECTED NOT DETECTED Final   Candida glabrata NOT DETECTED NOT DETECTED Final   Candida krusei NOT DETECTED NOT DETECTED Final   Candida parapsilosis NOT DETECTED NOT DETECTED Final   Candida tropicalis NOT DETECTED NOT DETECTED Final    Comment: Performed at [facility], [address] [place], [place] [address]  MRSA PCR Screening     Status: Abnormal   Collection Time: 05/03/18  6:12 AM  Result Value Ref Range Status   MRSA by PCR POSITIVE (A) NEGATIVE Final    Comment:        The GeneXpert MRSA Assay (FDA approved for NASAL specimens only), is one component of a comprehensive MRSA colonization surveillance program. It is not intended to diagnose MRSA infection nor to guide or monitor treatment for MRSA infections. RESULT CALLED TO, READ BACK BY AND VERIFIED WITH: Lyla GlassingL. Bennett RN 9:40 05/03/18 ([email or web address]) Performed at [facility], [address] [place], [place] [address]   Culture, blood (routine x 2)     Status: None (Preliminary result)   Collection Time: 05/03/18  10:01 PM  Result Value Ref Range Status   Specimen Description BLOOD RIGHT HAND  Final   Special Requests   Final    BOTTLES DRAWN AEROBIC AND ANAEROBIC Blood Culture adequate volume   Culture   Final    NO GROWTH 2 DAYS Performed at [facility], [address] [place], [place] [address]    Report Status PENDING  Incomplete  Culture, blood (routine x 2)     Status: None (Preliminary result)   Collection Time: 05/03/18 10:09 PM  Result Value Ref Range Status   Specimen Description BLOOD LEFT HAND  Final   Special Requests   Final    BOTTLES DRAWN AEROBIC ONLY Blood Culture adequate volume   Culture   Final    NO GROWTH 2 DAYS Performed at [facility], [address] [place], [place] [address]    Report Status PENDING  Incomplete         Radiology Studies: No results found.      Scheduled Meds: . bisacodyl  10 mg Rectal Daily  . Chlorhexidine Gluconate Cloth  6 each Topical Q0600  . liver oil-zinc oxide   Topical QID  . mupirocin ointment  1 application Nasal BID  . simethicone  40 mg Oral QID   Continuous Infusions: . dexrose 5 % and 0.45 % NaCl with KCl 30 mEq/L 75 mL/hr at 05/06/18 0352  . heparin 2,250 Units/hr (05/06/18 0355)  . potassium chloride 10 mEq (05/06/18 1108)  . vancomycin 750 mg (05/06/18 0352)     LOS: 4 days     Jacquelin Hawkingalph , MD [facility] 05/06/2018, 11:10 AM  If 7PM-7AM, please contact night-coverage [email or web address]

## 2018-05-06 NOTE — Progress Notes (Signed)
    [facility] has been requested to perform a transesophageal echocardiogram on Wayne Buckley for bacteremia.  After careful review of history and examination, the risks and benefits of transesophageal echocardiogram have been explained including risks of esophageal damage, perforation (1:10,000 risk), bleeding, pharyngeal hematoma as well as other potential complications associated with conscious sedation including aspiration, arrhythmia, respiratory failure and death. Alternatives to treatment were discussed, questions were answered. Patient is willing to proceed.   Georgie ChardJill McDaniel, NP  05/06/2018 4:09 PM

## 2018-05-06 NOTE — Consult Note (Signed)
WOC Nurse wound consult note Reason for Consult:deep tissue injury to bilateral buttocks in gluteal fold.  This was first identified at SNF prior to admission here.  Patient has had copious loose stools over the last few days and breakdown ongoing with exposure to incontinence.  Wound type: Pressure and moisture.  Pressure Injury POA: Yes Measurement: Left gluteal:  2.2 cm x 1 cm x 0.1 cm with sloughing epithelium present Right gluteal:  2 cm  1.5 cm x 0.1 cm  Wound ZOX:[id number]:devitalized tissue Drainage (amount, consistency, odor) minimal serosanguinous  NO odor Periwound:maroon disocloration, indicative of further tissue damage  [name] and this is explained to her.  Dressing procedure/placement/frequency: Cleanse gluteal wounds with NS.  Apply Santyl to open areas.  Cover with NS moist 2x2 and foam dressings.  Peel back foam and change daily. Foam is changed every three days and PRN soilage.  Will not follow at this time.  Please re-consult if needed.  Maple HudsonKaren Kianah Harries MSN, RN, FNP-BC CWON Wound, Ostomy, Continence Nurse Pager [phone]

## 2018-05-06 NOTE — Progress Notes (Signed)
ANTICOAGULATION CONSULT NOTE   Pharmacy Consult for heparin Indication: h/o DVT  No Known Allergies  Patient Measurements: Weight: 199 lb 8.3 oz (90.5 kg) Heparin Dosing Weight: 91  Vital Signs: Temp: 98.9 F (37.2 C) (11/18 0616) Temp Source: Oral (11/18 0616) BP: 122/104 (11/18 0616) Pulse Rate: 86 (11/18 0616)  Labs: Recent Labs    05/04/18 0555  05/05/18 0358 05/05/18 1018 05/05/18 1950 05/06/18 0538  HGB 9.0*  --  8.7*  --   --  8.4*  HCT 28.0*  --  28.0*  --   --  26.4*  PLT 320  --  468*  --   --  580*  APTT 50*   < > 55* 53* 70* 87*  HEPARINUNFRC  --    < > 1.04* 1.02*  --  0.39  CREATININE 0.85  --   --  0.69  --   --    < > = values in this interval not displayed.    Estimated Creatinine Clearance: 101.8 mL/min (by C-G formula based on SCr of 0.69 mg/dL).   Medical History: Past Medical History:  Diagnosis Date  . Hypertension   . TBI (traumatic brain injury) (HCC) 2013    Assessment: 67 yo male with h/o DVT on apixaban PTA. Last known dose of apixaban was 11/14 at 1100. MD would like to start a heparin drip for therapeutic anticoagulation while apixaban is on hold in the setting of ileus. Since patient has been on apixaban, we will use aPTT initially to titrate heparin due to apixaban's effect on heparin levels.    APTT 87 sec, heparin level 0.39 units/ml  Goal of Therapy:  Heparin level 0.3-0.7 units/ml  APTT 66-102 sec Monitor platelets by anticoagulation protocol: Yes   Plan:  Continue Heparin drip at 2250 units/hr Check heparin level later today to confirm Daily heparin level, aPTT and CBC, Monitor for bleeding  Thanks for allowing pharmacy to be a part of this patient's care.  Talbert CageLora Arshi Duarte, PharmD Clinical Pharmacist   05/06/2018 6:37 AM

## 2018-05-06 NOTE — Progress Notes (Signed)
Subjective:  Abdominal distention less per patient and per patient's wife. Having lots of productive bowel movements over past 24 hours. No abdominal pain.  Objective: Vital signs in last 24 hours: Temp:  [97.9 F (36.6 C)-99.1 F (37.3 C)] 97.9 F (36.6 C) (11/18 0930) Pulse Rate:  [86-92] 87 (11/18 0930) Resp:  [18-21] 18 (11/18 0930) BP: (122-144)/(79-104) 144/79 (11/18 0930) SpO2:  [95 %-99 %] 99 % (11/18 0930) Weight change:  Last BM Date: 05/05/18  PE: GEN:  Chronically ill-appearing ABD:  Moderate distention with tympany; non-tender; hypoactive bowel sounds; no peritonitis  Lab Results: CBC    Component Value Date/Time   WBC 12.5 (H) 05/06/2018 0538   RBC 3.00 (L) 05/06/2018 0538   HGB 8.4 (L) 05/06/2018 0538   HCT 26.4 (L) 05/06/2018 0538   PLT 580 (H) 05/06/2018 0538   MCV 88.0 05/06/2018 0538   MCH 28.0 05/06/2018 0538   MCHC 31.8 05/06/2018 0538   RDW 14.7 05/06/2018 0538   LYMPHSABS 0.7 05/02/2018 1402   MONOABS 0.5 05/02/2018 1402   EOSABS 0.0 05/02/2018 1402   BASOSABS 0.0 05/02/2018 1402   CMP     Component Value Date/Time   NA 138 05/06/2018 0538   K 3.8 05/06/2018 0538   CL 114 (H) 05/06/2018 0538   CO2 18 (L) 05/06/2018 0538   GLUCOSE 107 (H) 05/06/2018 0538   BUN 12 05/06/2018 0538   CREATININE 0.75 05/06/2018 0538   CALCIUM 7.8 (L) 05/06/2018 0538   PROT 6.1 (L) 05/02/2018 1402   ALBUMIN 2.3 (L) 05/02/2018 1402   AST 83 (H) 05/02/2018 1402   ALT 36 05/02/2018 1402   ALKPHOS 67 05/02/2018 1402   BILITOT 0.5 05/02/2018 1402   GFRNONAA >60 05/06/2018 0538   GFRAA >60 05/06/2018 0538   No abdominal xray today  Assessment:  1.  Fevers, MRSA UTI? 2.  Traumatic brain injury with central cord syndrome. 3.  Hypokalemia. 4.  Ileus, likely from infection, superimposed by poor mobility and functional status and hypokalemia. 5.  Malnutrition.  Plan:  1.  Since patient is improving and wife and patient note significant improvement in  abdominal distention, I don't see need for repeat xray at this time. 2.  Continue scheduled enemas and suppositories. 3.  Continue potassium repletion. 4.  If possible, OOBTC or at least sitting up in bed, might help improve gut motility. 5.  Consider speech therapy consult for swallowing study to see if, and what, patient can start eating. 6.  [facility] will follow along every couple days.   Aleshia Cartelli M 05/06/2018, 10:14 AM   Cell [phone] If no answer or after 5 PM call [phone]

## 2018-05-06 NOTE — Progress Notes (Signed)
ANTICOAGULATION CONSULT NOTE - Follow-Up Consult  Pharmacy Consult for Heparin  Indication: h/o DVT (while Apixaban on hold)  No Known Allergies  Patient Measurements: Weight: 199 lb 8.3 oz (90.5 kg) Heparin Dosing Weight: 91  Vital Signs: Temp: 97.9 F (36.6 C) (11/18 0930) Temp Source: Oral (11/18 0930) BP: 144/79 (11/18 0930) Pulse Rate: 87 (11/18 0930)  Labs: Recent Labs    05/04/18 0555  05/05/18 0358 05/05/18 1018 05/05/18 1950 05/06/18 0538 05/06/18 1115  HGB 9.0*  --  8.7*  --   --  8.4*  --   HCT 28.0*  --  28.0*  --   --  26.4*  --   PLT 320  --  468*  --   --  580*  --   APTT 50*   < > 55* 53* 70* 87*  --   HEPARINUNFRC  --    < > 1.04* 1.02*  --  0.39 0.37  CREATININE 0.85  --   --  0.69  --  0.75  --    < > = values in this interval not displayed.    Estimated Creatinine Clearance: 101.8 mL/min (by C-G formula based on SCr of 0.75 mg/dL).   Medical History: Past Medical History:  Diagnosis Date  . Hypertension   . TBI (traumatic brain injury) (HCC) 2013    Assessment: 67 yo male with h/o DVT on apixaban PTA. Last known dose of apixaban was 11/14 at 1100. MD would like to start a heparin drip for therapeutic anticoagulation while apixaban is on hold in the setting of ileus. Pharmacy on board for Heparin dosing.  Heparin level this afternoon remains therapeutic (HL 0.37 << 0.39, goal of 0.3-0.7). CBC low but stable - no bleeding noted at this time.   Goal of Therapy:  Heparin level 0.3-0.7 units/ml  APTT 66-102 sec Monitor platelets by anticoagulation protocol: Yes   Plan:  - Continue Heparin at 2250 units/hr (22.5 ml/hr) - Will follow-up on plans to transition back to Apixaban once ileus fully resolves - Will continue to monitor for any signs/symptoms of bleeding and will follow up with heparin level in the a.m.   Thank you for allowing pharmacy to be a part of this patient's care.  Georgina PillionElizabeth Vaida Kerchner, PharmD, BCPS Clinical Pharmacist Pager:  [phone] Clinical phone for 05/06/2018 from 7a-3:30p: [phone] If after 3:30p, please call main pharmacy at: [phone] Please check AMION for all [facility] numbers 05/06/2018 12:27 PM

## 2018-05-07 ENCOUNTER — Encounter (HOSPITAL_COMMUNITY): Payer: Self-pay | Admitting: Physical Medicine and Rehabilitation

## 2018-05-07 ENCOUNTER — Encounter (HOSPITAL_COMMUNITY)
Admission: EM | Disposition: A | Discharge status: Skilled Nursing Facility | Payer: Self-pay | Source: Home / Self Care | Attending: Internal Medicine

## 2018-05-07 ENCOUNTER — Inpatient Hospital Stay (HOSPITAL_COMMUNITY)
Admit: 2018-05-07 | Discharge: 2018-05-07 | Disposition: A | Discharge status: Home / Self Care | Payer: Medicare Other | Attending: Cardiology | Admitting: Cardiology

## 2018-05-07 DIAGNOSIS — R7881 Bacteremia: Secondary | ICD-10-CM

## 2018-05-07 DIAGNOSIS — K598 Other specified functional intestinal disorders: Secondary | ICD-10-CM

## 2018-05-07 DIAGNOSIS — I38 Endocarditis, valve unspecified: Secondary | ICD-10-CM

## 2018-05-07 LAB — CBC
HCT: 26.7 % — ABNORMAL LOW (ref 39.0–52.0)
HEMOGLOBIN: 8.4 g/dL — AB (ref 13.0–17.0)
MCH: 28 pg (ref 26.0–34.0)
MCHC: 31.5 g/dL (ref 30.0–36.0)
MCV: 89 fL (ref 80.0–100.0)
Platelets: 755 10*3/uL — ABNORMAL HIGH (ref 150–400)
RBC: 3 MIL/uL — ABNORMAL LOW (ref 4.22–5.81)
RDW: 15 % (ref 11.5–15.5)
WBC: 15.2 10*3/uL — AB (ref 4.0–10.5)
nRBC: 0 % (ref 0.0–0.2)

## 2018-05-07 LAB — BASIC METABOLIC PANEL
Anion gap: 5 (ref 5–15)
BUN: 8 mg/dL (ref 8–23)
CALCIUM: 8 mg/dL — AB (ref 8.9–10.3)
CO2: 21 mmol/L — ABNORMAL LOW (ref 22–32)
CREATININE: 0.79 mg/dL (ref 0.61–1.24)
Chloride: 112 mmol/L — ABNORMAL HIGH (ref 98–111)
GFR calc Af Amer: >60 mL/min (ref >60)
GLUCOSE: 107 mg/dL — AB (ref 70–99)
Potassium: 3.8 mmol/L (ref 3.5–5.1)
Sodium: 138 mmol/L (ref 135–145)

## 2018-05-07 LAB — APTT: APTT: 74 s — AB (ref 24–36)

## 2018-05-07 LAB — HEPARIN LEVEL (UNFRACTIONATED): HEPARIN UNFRACTIONATED: 0.14 [IU]/mL — AB (ref 0.30–0.70)

## 2018-05-07 SURGERY — INVASIVE LAB ABORTED CASE

## 2018-05-07 MED ORDER — DIPHENHYDRAMINE HCL 50 MG/ML IJ SOLN
INTRAMUSCULAR | Status: AC
  Filled 2018-05-07: qty 1

## 2018-05-07 MED ORDER — POTASSIUM CHLORIDE 10 MEQ/100ML IV SOLN
10.0000 meq | INTRAVENOUS | Status: AC
  Administered 2018-05-07 (×3): 10 meq via INTRAVENOUS
  Filled 2018-05-07 (×4): qty 100

## 2018-05-07 MED ORDER — APIXABAN 5 MG PO TABS
5.0000 mg | ORAL_TABLET | Freq: Two times a day (BID) | ORAL | Status: DC
  Administered 2018-05-07 – 2018-05-31 (×49): 5 mg via ORAL
  Filled 2018-05-07 (×49): qty 1

## 2018-05-07 MED ORDER — ACETAMINOPHEN 325 MG PO TABS
650.0000 mg | ORAL_TABLET | Freq: Four times a day (QID) | ORAL | Status: DC | PRN
  Administered 2018-05-07 – 2018-05-22 (×5): 650 mg via ORAL
  Filled 2018-05-07 (×5): qty 2

## 2018-05-07 MED ORDER — FENTANYL CITRATE (PF) 100 MCG/2ML IJ SOLN
INTRAMUSCULAR | Status: AC
  Filled 2018-05-07: qty 2

## 2018-05-07 MED ORDER — MIDAZOLAM HCL (PF) 10 MG/2ML IJ SOLN
INTRAMUSCULAR | Status: DC | PRN
  Administered 2018-05-07: 1 mg via INTRAVENOUS
  Administered 2018-05-07: 2 mg via INTRAVENOUS
  Administered 2018-05-07: 1 mg via INTRAVENOUS

## 2018-05-07 MED ORDER — SODIUM CHLORIDE 0.9 % IV SOLN: INTRAVENOUS | Status: DC

## 2018-05-07 MED ORDER — FENTANYL CITRATE (PF) 100 MCG/2ML IJ SOLN
INTRAMUSCULAR | Status: DC | PRN
  Administered 2018-05-07: 25 ug via INTRAVENOUS
  Administered 2018-05-07: 12.5 ug via INTRAVENOUS

## 2018-05-07 MED ORDER — MIDAZOLAM HCL (PF) 5 MG/ML IJ SOLN
INTRAMUSCULAR | Status: AC
  Filled 2018-05-07: qty 2

## 2018-05-07 MED ORDER — BUTAMBEN-TETRACAINE-BENZOCAINE 2-2-14 % EX AERO
INHALATION_SPRAY | CUTANEOUS | Status: DC | PRN
  Administered 2018-05-07: 2 via TOPICAL

## 2018-05-07 NOTE — Progress Notes (Signed)
PROGRESS NOTE    Wayne Buckley  MRN:[id number] DOB: 10/28/1950 DOA: 05/02/2018 PCP: Ross, Charles Alan, MD   Brief Narrative: Wayne Buckley is a 67 y.o. male with medical history significant of traumatic brain injury, central cord syndrome, neurogenic bladder with indwelling Foley catheter, bilateral leg weakness, prior DVT, hypertension, alcohol abuse. He presented secondary to fever. He was found to have MRSA in his urine prior to arrival. Blood cultures positive for MRSA and CT abdomen concerning for ileus.   Assessment & Plan:   Principal Problem:   Ogilvie's syndrome Active Problems:   Alcohol abuse   AKI (acute kidney injury) (HCC)   Central cord syndrome (HCC)   Essential hypertension   Sepsis (HCC)   Chronic anemia   Hyponatremia   Sacral ulcer (HCC)   History of DVT (deep vein thrombosis)   Traumatic brain injury (HCC)   MRSA bacteremia   Sepsis Secondary to UTI and MRSA bacteremia. Physiology improved.  MRSA bacteremia MRSA UTI Patient empirically treated with Vancomycin and cefepime. Transthoracic Echocardiogram without evidence of vegetations per report. Repeat Bcx (11/15) no growth to date -Continue Vancomycin -ID recommendations: Transesophageal Echocardiogram planned for 05/08/18 -CIR consult placed  Acute kidney injury Baseline creatinine of <1. Creatinine of 1.67 on admission. Resolved  Ileus General surgery has evaluated. Questioning possible mechanical component vs Ogilvie syndrome. Abdomen is significantly distended. Patient has had a bowel movement today with flatus. -Diet advanced -GI recommendations: enemas/suppositories, keep potassium >4 -General surgery recommendations: GI; signed off. -Continue D5 1/2 NS with potassium (expires 72 hours after hanging)  Chronic anemia Likely chronic anemia. Slight drop in setting of fluid resuscitation. No evidence of bleeding. Stable.  History of DVT Patient is on Eliquis as an outpatient. Currently  NPO. -Hold Eliquis until able to take food by mouth -Continue Heparin while NPO  History of TBI -PT eval: SNF  Essential hypertension Normotensive. -Continue to hold home antihypertensives  History of alcohol abuse Patient has been at SNF. Discontinued CIWA  Pressure injury DTI, bilateral buttocks, POA  Urinary retention Appears to be related to cord compression. Patient is meant to follow with urology as an outpatient. Plan for voiding trial prior to discharge.  DVT prophylaxis: Heparin drip Code Status:   Code Status: Full Code Family Communication: [name] Disposition Plan: Discharge to SNF in several days   Consultants:   General surgery  Infectious disease  Gastroenterology  Procedures:   Transthoracic Echocardiogram (11/16) Study Conclusions  - Left ventricle: The cavity size was normal. Systolic function was   normal. The estimated ejection fraction was in the range of 60%   to 65%. Wall motion was normal; there were no regional wall   motion abnormalities. - Left atrium: The atrium was mildly dilated. - Atrial septum: There was increased thickness of the septum,   consistent with lipomatous hypertrophy. - Pulmonary arteries: Systolic pressure could not be accurately   estimated.  Antimicrobials:  Vancomycin (11/14>>  Cefepime (11/14)    Subjective: No abdominal pain. No issues overnight.  Objective: Vitals:   05/06/18 1750 05/06/18 2050 05/07/18 0453 05/07/18 0856  BP: 133/82 (!) 158/90 118/83 (!) 146/85  Pulse: (!) 103 97 94 92  Resp: 18 (!) 22 15 18  Temp: 98.6 F (37 C) 98.9 F (37.2 C) 97.8 F (36.6 C) 98.9 F (37.2 C)  TempSrc: Oral  Oral Oral  SpO2: 97% 99% 99% 94%  Weight:        Intake/Output Summary (Last 24 hours) at 05/07/2018   1119 Last data filed at 05/07/2018 0600 Gross per 24 hour  Intake 1775.59 ml  Output 1050 ml  Net 725.59 ml   Filed Weights   05/02/18 1413 05/02/18 2215 05/04/18 2314  Weight: 90.7  kg 91.2 kg 90.5 kg    Examination:  General exam: Appears calm and comfortable Respiratory system: Clear to auscultation. Respiratory effort normal. Cardiovascular system: S1 & S2 heard, RRR. No murmurs, rubs, gallops or clicks. Gastrointestinal system: Abdomen is distended, soft and nontender. Normal bowel sounds heard. Central nervous system: Alert and oriented. Dysarthria Extremities: No edema. No calf tenderness Skin: No cyanosis. No rashes Psychiatry: Judgement and insight appear normal. Mood & affect appropriate   Data Reviewed: I have personally reviewed following labs and imaging studies  CBC: Recent Labs  Lab 05/02/18 1402 05/03/18 0533 05/04/18 0555 05/05/18 0358 05/06/18 0538 05/07/18 0403  WBC 7.8 7.3 9.7 12.9* 12.5* 15.2*  NEUTROABS 6.5  --   --   --   --   --   HGB 9.7* 8.9* 9.0* 8.7* 8.4* 8.4*  HCT 30.2* 28.1* 28.0* 28.0* 26.4* 26.7*  MCV 88.6 87.0 87.2 88.3 88.0 89.0  PLT 253 256 320 468* 580* 755*   Basic Metabolic Panel: Recent Labs  Lab 05/03/18 0533 05/04/18 0555 05/05/18 1018 05/05/18 1950 05/06/18 0538 05/06/18 1616 05/07/18 0403  NA 134* 136 138  --  138  --  138  K 3.3* 3.3* 3.5 3.7 3.8 3.9 3.8  CL 104 110 113*  --  114*  --  112*  CO2 21* 20* 20*  --  18*  --  21*  GLUCOSE 110* 128* 101*  --  107*  --  107*  BUN 68* 46* 20  --  12  --  8  CREATININE 1.16 0.85 0.69  --  0.75  --  0.79  CALCIUM 8.4* 8.5* 8.3*  --  7.8*  --  8.0*  MG 2.2  --   --   --   --   --   --   PHOS 2.9  --   --   --   --   --   --    GFR: Estimated Creatinine Clearance: 101.8 mL/min (by C-G formula based on SCr of 0.79 mg/dL). Liver Function Tests: Recent Labs  Lab 05/02/18 1402  AST 83*  ALT 36  ALKPHOS 67  BILITOT 0.5  PROT 6.1*  ALBUMIN 2.3*   No results for input(s): LIPASE, AMYLASE in the last 168 hours. No results for input(s): AMMONIA in the last 168 hours. Coagulation Profile: No results for input(s): INR, PROTIME in the last 168  hours. Cardiac Enzymes: No results for input(s): CKTOTAL, CKMB, CKMBINDEX, TROPONINI in the last 168 hours. BNP (last 3 results) No results for input(s): PROBNP in the last 8760 hours. HbA1C: No results for input(s): HGBA1C in the last 72 hours. CBG: Recent Labs  Lab 05/03/18 0823  GLUCAP 97   Lipid Profile: No results for input(s): CHOL, HDL, LDLCALC, TRIG, CHOLHDL, LDLDIRECT in the last 72 hours. Thyroid Function Tests: No results for input(s): TSH, T4TOTAL, FREET4, T3FREE, THYROIDAB in the last 72 hours. Anemia Panel: No results for input(s): VITAMINB12, FOLATE, FERRITIN, TIBC, IRON, RETICCTPCT in the last 72 hours. Sepsis Labs: Recent Labs  Lab 05/02/18 1412 05/02/18 1644 05/02/18 2344  PROCALCITON  --   --  6.29  LATICACIDVEN 1.13 1.66  --     Recent Results (from the past 240 hour(s))  Blood Culture (routine x 2)       Status: Abnormal   Collection Time: 05/02/18  1:46 PM  Result Value Ref Range Status   Specimen Description BLOOD RIGHT ANTECUBITAL  Final   Special Requests   Final    BOTTLES DRAWN AEROBIC AND ANAEROBIC Blood Culture adequate volume   Culture  Setup Time   Final    GRAM POSITIVE COCCI IN CLUSTERS IN BOTH AEROBIC AND ANAEROBIC BOTTLES CRITICAL VALUE NOTED.  VALUE IS CONSISTENT WITH PREVIOUSLY REPORTED AND CALLED VALUE. Performed at [facility], [address] [place], [place] [address]    Culture STAPHYLOCOCCUS AUREUS (A)  Final   Report Status 05/05/2018 FINAL  Final  Blood Culture (routine x 2)     Status: Abnormal   Collection Time: 05/02/18  2:03 PM  Result Value Ref Range Status   Specimen Description BLOOD LEFT ANTECUBITAL  Final   Special Requests   Final    BOTTLES DRAWN AEROBIC AND ANAEROBIC Blood Culture adequate volume   Culture  Setup Time   Final    GRAM POSITIVE COCCI IN CLUSTERS IN BOTH AEROBIC AND ANAEROBIC BOTTLES CRITICAL RESULT CALLED TO, READ BACK BY AND VERIFIED WITH: M. Pham PharmD 10:05 05/03/18  ([email or web address]) Performed at [facility], [address] [place], [place] [address]    Culture METHICILLIN RESISTANT STAPHYLOCOCCUS AUREUS (A)  Final   Report Status 05/05/2018 FINAL  Final   Organism ID, Bacteria METHICILLIN RESISTANT STAPHYLOCOCCUS AUREUS  Final      Susceptibility   Methicillin resistant staphylococcus aureus - MIC*    CIPROFLOXACIN >=8 RESISTANT Resistant     ERYTHROMYCIN >=8 RESISTANT Resistant     GENTAMICIN <=0.5 SENSITIVE Sensitive     OXACILLIN >=4 RESISTANT Resistant     TETRACYCLINE <=1 SENSITIVE Sensitive     VANCOMYCIN 1 SENSITIVE Sensitive     TRIMETH/SULFA >=320 RESISTANT Resistant     CLINDAMYCIN <=0.25 SENSITIVE Sensitive     RIFAMPIN <=0.5 SENSITIVE Sensitive     Inducible Clindamycin NEGATIVE Sensitive     * METHICILLIN RESISTANT STAPHYLOCOCCUS AUREUS  Blood Culture ID Panel (Reflexed)     Status: Abnormal   Collection Time: 05/02/18  2:03 PM  Result Value Ref Range Status   Enterococcus species NOT DETECTED NOT DETECTED Final   Listeria monocytogenes NOT DETECTED NOT DETECTED Final   Staphylococcus species DETECTED (A) NOT DETECTED Final    Comment: CRITICAL RESULT CALLED TO, READ BACK BY AND VERIFIED WITH: M. Pham PharmD 10:05 05/03/18 ([email or web address])    Staphylococcus aureus (BCID) DETECTED (A) NOT DETECTED Final    Comment: Methicillin (oxacillin)-resistant Staphylococcus aureus (MRSA). MRSA is predictably resistant to beta-lactam antibiotics (except ceftaroline). Preferred therapy is vancomycin unless clinically contraindicated. Patient requires contact precautions if  hospitalized. CRITICAL RESULT CALLED TO, READ BACK BY AND VERIFIED WITH: M. Pham PharmD 10:05 05/03/18 ([email or web address])    Methicillin resistance DETECTED (A) NOT DETECTED Final    Comment: CRITICAL RESULT CALLED TO, READ BACK BY AND VERIFIED WITH: M. Pham PharmD 10:05 05/03/18 ([email or web address])    Streptococcus species NOT DETECTED NOT DETECTED Final   Streptococcus agalactiae NOT DETECTED  NOT DETECTED Final   Streptococcus pneumoniae NOT DETECTED NOT DETECTED Final   Streptococcus pyogenes NOT DETECTED NOT DETECTED Final   Acinetobacter baumannii NOT DETECTED NOT DETECTED Final   Enterobacteriaceae species NOT DETECTED NOT DETECTED Final   Enterobacter cloacae complex NOT DETECTED NOT DETECTED Final   Escherichia coli NOT DETECTED NOT DETECTED Final   Klebsiella oxytoca NOT DETECTED NOT DETECTED Final   Klebsiella pneumoniae NOT   DETECTED NOT DETECTED Final   Proteus species NOT DETECTED NOT DETECTED Final   Serratia marcescens NOT DETECTED NOT DETECTED Final   Haemophilus influenzae NOT DETECTED NOT DETECTED Final   Neisseria meningitidis NOT DETECTED NOT DETECTED Final   Pseudomonas aeruginosa NOT DETECTED NOT DETECTED Final   Candida albicans NOT DETECTED NOT DETECTED Final   Candida glabrata NOT DETECTED NOT DETECTED Final   Candida krusei NOT DETECTED NOT DETECTED Final   Candida parapsilosis NOT DETECTED NOT DETECTED Final   Candida tropicalis NOT DETECTED NOT DETECTED Final    Comment: Performed at [facility], [address] [place], [place] [address]  MRSA PCR Screening     Status: Abnormal   Collection Time: 05/03/18  6:12 AM  Result Value Ref Range Status   MRSA by PCR POSITIVE (A) NEGATIVE Final    Comment:        The GeneXpert MRSA Assay (FDA approved for NASAL specimens only), is one component of a comprehensive MRSA colonization surveillance program. It is not intended to diagnose MRSA infection nor to guide or monitor treatment for MRSA infections. RESULT CALLED TO, READ BACK BY AND VERIFIED WITH: L. Bennett RN 9:40 05/03/18 ([email or web address]) Performed at [facility], [address] [place], [place] [address]   Culture, blood (routine x 2)     Status: None (Preliminary result)   Collection Time: 05/03/18 10:01 PM  Result Value Ref Range Status   Specimen Description BLOOD RIGHT HAND  Final   Special Requests   Final    BOTTLES DRAWN  AEROBIC AND ANAEROBIC Blood Culture adequate volume   Culture   Final    NO GROWTH 3 DAYS Performed at [facility], [address] [place], [place] [address]    Report Status PENDING  Incomplete  Culture, blood (routine x 2)     Status: None (Preliminary result)   Collection Time: 05/03/18 10:09 PM  Result Value Ref Range Status   Specimen Description BLOOD LEFT HAND  Final   Special Requests   Final    BOTTLES DRAWN AEROBIC ONLY Blood Culture adequate volume   Culture   Final    NO GROWTH 3 DAYS Performed at [facility], [address] [place], [place] [address]    Report Status PENDING  Incomplete         Radiology Studies: No results found.      Scheduled Meds: . apixaban  5 mg Oral BID  . bisacodyl  10 mg Rectal Daily  . Chlorhexidine Gluconate Cloth  6 each Topical Q0600  . collagenase   Topical Daily  . liver oil-zinc oxide   Topical QID  . mupirocin ointment  1 application Nasal BID  . simethicone  40 mg Oral QID   Continuous Infusions: . dexrose 5 % and 0.45 % NaCl with KCl 30 mEq/L 75 mL/hr at 05/07/18 0709  . vancomycin 750 mg (05/07/18 0352)     LOS: 5 days     Arella Blinder, MD [facility] 05/07/2018, 11:19 AM  If 7PM-7AM, please contact night-coverage [email or web address] 

## 2018-05-07 NOTE — Consult Note (Signed)
Physical Medicine and Rehabilitation Consult   Reason for Consult: Debility Referring Physician: Dr. Caleb Popp   HPI: Wayne Buckley is a 67 y.o. 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 He was readmitted on 05/02/18 with fall, UTI with fever and diffuse abdominal distension. He was found to have 9 cm gaseous distension of colon, was made NPO and treated with fluids. BC positive for MRSA bacteremia and antibiotics changed to Vancomycin per per ID and TEE pending. He continued to have issues with abdominal distension felt to be due to Oglivie syndrome despite BMs. Dr. Dulce Sellar recommends repletion of hypokalemia, scheduled bowel program with enemas and suppositories as well as mobility. CIR recommended by OT.     Review of Systems  Unable to perform ROS: Mental acuity  Respiratory: Negative for cough.   Cardiovascular: Negative for chest pain.  Gastrointestinal: Negative for abdominal pain.  Neurological: Positive for weakness.     Past Medical History:  Diagnosis Date  . Hypertension   . TBI (traumatic brain injury) (HCC) 2013    Past Surgical History:  Procedure Laterality Date  . ANTERIOR CERVICAL DECOMP/DISCECTOMY FUSION N/A 02/27/2018   Procedure: ANTERIOR CERVICAL DECOMPRESSION/DISCECTOMY FUSION C6-7;  Surgeon: Donalee Citrin, MD;  Location: [facility] OR;  Service: Neurosurgery;  Laterality: N/A;  . CRANIOTOMY  04/28/2012   Procedure: CRANIOTOMY HEMATOMA EVACUATION SUBDURAL;  Surgeon: Reinaldo Meeker, MD;  Location: [facility] OR;  Service: Neurosurgery;  Laterality: Left;  Left Craniotomy for Subdural Hematoma  . RADIOLOGY WITH ANESTHESIA N/A 02/27/2018   Procedure: MRI WITH ANESTHESIA;  Surgeon: Radiologist, Medication, MD;  Location: [facility] OR;  Service: Radiology;  Laterality: N/A;  . VENA CAVA FILTER PLACEMENT Right  02/27/2018   Procedure: INSERTION VENA-CAVA FILTER;  Surgeon: Nada Libman, MD;  Location: [facility] OR;  Service: Vascular;  Laterality: Right;    Family History  Problem Relation Age of Onset  . Heart disease Father     Social History:  reports that he has never smoked. He has never used smokeless tobacco. He reports that he drinks alcohol. His drug history is not on file.    Allergies: No Known Allergies    Medications Prior to Admission  Medication Sig Dispense Refill  . acetaminophen (TYLENOL) 325 MG tablet Take 2 tablets (650 mg total) by mouth every 6 (six) hours as needed for mild pain.    Marland Kitchen acetaminophen (TYLENOL) 500 MG tablet Take 1,000 mg by mouth every 6 (six) hours as needed for mild pain.    Marland Kitchen amLODipine (NORVASC) 10 MG tablet Take 1 tablet (10 mg total) by mouth daily.    Marland Kitchen apixaban (ELIQUIS) 5 MG TABS tablet Take 1 tablet (5 mg total) by mouth 2 (two) times daily. 60 tablet   . bisacodyl (DULCOLAX) 10 MG suppository Place 1 suppository (10 mg total) rectally daily at 6 (six) AM. 12 suppository 0  . carvedilol (COREG) 12.5 MG tablet Take 1 tablet (12.5 mg total) by mouth 2 (two) times daily with a meal.    . cefTRIAXone (ROCEPHIN) 1 g injection Inject 1 g into the muscle daily. For 7 days for pneumonia.    . hydrALAZINE (APRESOLINE) 50 MG tablet Take 1 tablet (50 mg total) by mouth every 8 (eight) hours.    . hydrochlorothiazide (HYDRODIURIL) 25 MG tablet Take 1 tablet (  25 mg total) by mouth daily.    . nitrofurantoin, macrocrystal-monohydrate, (MACROBID) 100 MG capsule Take 100 mg by mouth every 12 (twelve) hours.    . potassium chloride SA (K-DUR,KLOR-CON) 20 MEQ tablet Take 1 tablet (20 mEq total) by mouth 2 (two) times daily.    . tamsulosin (FLOMAX) 0.4 MG CAPS capsule Take 1 capsule (0.4 mg total) by mouth daily. 30 capsule   . vitamin C (VITAMIN C) 1000 MG tablet Take 1 tablet (1,000 mg total) by mouth 2 (two) times daily.      Home: Home Living Family/patient  expects to be discharged to:: Inpatient rehab Additional Comments: Pt was on CIR then went to SNF after previous fall this year  Functional History: Prior Function Level of Independence: Needs assistance Gait / Transfers Assistance Needed: Per wife pt was ambulating [facility] RW and [facility] ADL's / Homemaking Assistance Needed: A'ing with some of his ADLs at [facility] Comments: Unsure of PLOF since prior fall in October. One of the last therapy notes from this year states staff using Stedy to get OOB to wheelchair. Functional Status:  Mobility: Bed Mobility Overal bed mobility: Needs Assistance Bed Mobility: Rolling Rolling: Mod assist General bed mobility comments: increased time to reach with arms for rails Transfers Overall transfer level: Needs assistance Transfer via Lift Equipment: Maximove      ADL: ADL Overall ADL's : Needs assistance/impaired Eating/Feeding: Supervision/ safety, Set up, Bed level Eating/Feeding Details (indicate cue type and reason): supported sitting with adequate positioning Grooming: Modified independent, Bed level Grooming Details (indicate cue type and reason): with adequate positioning Upper Body Bathing: Moderate assistance Upper Body Bathing Details (indicate cue type and reason): with adequate positioning Lower Body Bathing: Maximal assistance, Bed level Upper Body Dressing : Maximal assistance Upper Body Dressing Details (indicate cue type and reason): supported sitting Lower Body Dressing: Total assistance, Bed level  Cognition: Cognition Overall Cognitive Status: History of cognitive impairments - at baseline Orientation Level: Oriented to person, Oriented to place, Disoriented to time, Disoriented to situation Cognition Arousal/Alertness: Awake/alert Behavior During Therapy: Restless Overall Cognitive Status: History of cognitive impairments - at baseline General Comments: Wife not present during session. Pt alert and oriented x3.  Could not tell me why he was in the hospital but [facility] Pt restless in bed but got agitated quickly when PT entered and suggested OOB. Pt adamantly refusing to get up.    Blood pressure (!) 146/85, pulse 92, temperature 98.9 F (37.2 C), temperature source Oral, resp. rate 18, weight 90.5 kg, SpO2 94 %. Physical Exam  Nursing note and vitals reviewed. Constitutional: He appears well-developed and well-nourished.  Cardiovascular: Normal heart sounds.  Respiratory: Effort normal.  GI: He exhibits distension. There is no tenderness.  Neurological:  Moderately dysarthric, ataxic speech. Limited ability to follow simple motor commands as wanted to be left alone to sleep. BUE ataxic with limited movements BLE. He was impulsive and difficult to redirect.   Psychiatric:  impulsive    Results for orders placed or performed during the hospital encounter of 05/02/18 (from the past 24 hour(s))  Urinalysis, Routine w reflex microscopic     Status: Abnormal   Collection Time: 05/06/18  4:09 PM  Result Value Ref Range   Color, Urine YELLOW YELLOW   APPearance TURBID (A) CLEAR   Specific Gravity, Urine 1.012 1.005 - 1.030   pH 5.0 5.0 - 8.0   Glucose, UA NEGATIVE NEGATIVE mg/dL   Hgb  urine dipstick NEGATIVE NEGATIVE   Bilirubin Urine NEGATIVE NEGATIVE   Ketones, ur NEGATIVE NEGATIVE mg/dL   Protein, ur NEGATIVE NEGATIVE mg/dL   Nitrite NEGATIVE NEGATIVE   Leukocytes, UA NEGATIVE NEGATIVE   RBC / HPF 0-5 0 - 5 RBC/hpf   WBC, UA 0-5 0 - 5 WBC/hpf   Bacteria, UA FEW (A) NONE SEEN   Squamous Epithelial / LPF 0-5 0 - 5   Mucus PRESENT    Granular Casts, UA PRESENT    Amorphous Crystal PRESENT   Potassium     Status: None   Collection Time: 05/06/18  4:16 PM  Result Value Ref Range   Potassium 3.9 3.5 - 5.1 mmol/L  CBC     Status: Abnormal   Collection Time: 05/07/18  4:03 AM  Result Value Ref Range   WBC 15.2 (H) 4.0 - 10.5 K/uL   RBC 3.00 (L) 4.22 - 5.81 MIL/uL    Hemoglobin 8.4 (L) 13.0 - 17.0 g/dL   HCT 16.1 (L) 09.6 - 04.5 %   MCV 89.0 80.0 - 100.0 fL   MCH 28.0 26.0 - 34.0 pg   MCHC 31.5 30.0 - 36.0 g/dL   RDW 40.9 81.1 - 91.4 %   Platelets 755 (H) 150 - 400 K/uL   nRBC 0.0 0.0 - 0.2 %  Heparin level (unfractionated)     Status: Abnormal   Collection Time: 05/07/18  4:03 AM  Result Value Ref Range   Heparin Unfractionated 0.14 (L) 0.30 - 0.70 IU/mL  APTT     Status: Abnormal   Collection Time: 05/07/18  4:03 AM  Result Value Ref Range   aPTT 74 (H) 24 - 36 seconds  Basic metabolic panel     Status: Abnormal   Collection Time: 05/07/18  4:03 AM  Result Value Ref Range   Sodium 138 135 - 145 mmol/L   Potassium 3.8 3.5 - 5.1 mmol/L   Chloride 112 (H) 98 - 111 mmol/L   CO2 21 (L) 22 - 32 mmol/L   Glucose, Bld 107 (H) 70 - 99 mg/dL   BUN 8 8 - 23 mg/dL   Creatinine, Ser 7.82 0.61 - 1.24 mg/dL   Calcium 8.0 (L) 8.9 - 10.3 mg/dL   GFR calc non Af Amer >60 >60 mL/min   GFR calc Af Amer >60 >60 mL/min   Anion gap 5 5 - 15   No results found.   Assessment/Plan: Diagnosis: debility superimposed on baseline deficits related to cervical spinal cord injury and old TBI 1. Does the need for close, 24 hr/day medical supervision in concert with the patient's rehab needs make it unreasonable for this patient to be served in a less intensive setting? No 2. Co-Morbidities requiring supervision/potential complications:   3. Due to bladder management, safety, disease management and patient education, does the patient require 24 hr/day rehab nursing? No 4. Does the patient require coordinated care of a physician, rehab nurse, n/a to address physical and functional deficits in the context of the above medical diagnosis(es)? No Addressing deficits in the following areas: balance, endurance, strength, bathing, dressing, feeding, grooming, toileting and cognition 5. Can the patient actively participate in an intensive therapy program of at least 3 hrs of  therapy per day at least 5 days per week? No 6. The potential for patient to make measurable gains while on inpatient rehab is poor 7. Anticipated functional outcomes upon discharge from inpatient rehab are n/a  with PT, n/a with OT, n/a with SLP. 8. Estimated  rehab length of stay to reach the above functional goals is:   9. Anticipated D/C setting: Other 10. Anticipated post D/C treatments: N/A 11. Overall Rehab/Functional Prognosis: fair  RECOMMENDATIONS: This patient's condition is appropriate for continued rehabilitative care in the following setting: SNF Patient has agreed to participate in recommended program. Potentially Note that insurance prior authorization may be required for reimbursement for recommended care.  Comment: Pt WELL known to me and rehab service. During his last visit, despite a prolonged stay and numerous attempts to accommodate patient and motivate him, he would not consistently participate in therapy or buy into what was taught.   Ranelle Oyster, MD, [facility] Physical Medicine & Rehabilitation 05/07/2018    Jacquelynn Cree, PA-C 05/07/2018

## 2018-05-07 NOTE — Interval H&P Note (Signed)
History and Physical Interval Note:  05/07/2018 2:34 PM  Wayne Buckley  has presented today for surgery, with the diagnosis of BACTEREMIA  The various methods of treatment have been discussed with the patient and family. After consideration of risks, benefits and other options for treatment, the patient has consented to  Procedure(s): TRANSESOPHAGEAL ECHOCARDIOGRAM (TEE) (N/A) as a surgical intervention .  The patient's history has been reviewed, patient examined, no change in status, stable for surgery.  I have reviewed the patient's chart and labs.  Questions were answered to the patient's satisfaction.     Kristeen MissPhilip Nahser

## 2018-05-07 NOTE — Care Management Important Message (Signed)
Important Message  Patient Details  Name: Wayne Buckley MRN: [id number] Date of Birth: 04/22/1951   Medicare Important Message Given:  Yes    Wayne Buckley, Wayne Fessel Ellen, RN 05/07/2018, 2:31 PM

## 2018-05-07 NOTE — CV Procedure (Signed)
    Transesophageal Echocardiogram Note  Kirkland HunJames M Aldana [id number] 10/04/1950  Procedure: Transesophageal Echocardiogram Indications: bacteremia  Procedure Details Consent: Obtained Time Out: Verified patient identification, verified procedure, site/side was marked, verified correct patient position, special equipment/implants available, Radiology Safety Procedures followed,  medications/allergies/relevent history reviewed, required imaging and test results available.  Performed  Medications:  During this procedure the patient is administered a total of Versed  4  mg and Fentanyl 37.4  mcg  to achieve and maintain moderate conscious sedation.  The patient's heart rate, blood pressure, and oxygen saturation are monitored continuously during the procedure. The period of conscious sedation is 30  minutes, of which I was present face-to-face 100% of this time.  We were unable to pass the scope any further than 30 cm.   We encountered a stricture or tight bend and could not easily pass through this obstruction without increasing the risk of the procedure. .   TEE was aborted.    Complications: No apparent complications Patient did tolerate procedure well.   Vesta MixerPhilip J. Nahser, Montez HagemanJr., MD, [facility] 05/07/2018, 3:33 PM

## 2018-05-07 NOTE — Progress Notes (Signed)
PT Cancellation Note  Patient Details Name: Wayne Buckley M Sek MRN: [id number] DOB: 06/13/1951   Cancelled Treatment:    Reason Eval/Treat Not Completed: Patient declined, no reason specified. Pt was being taken down for TEE soon, pt denied services and wife thought it was best to re-attempt. Wife planning to meet PT tomorrow afternoon at 1:30pm to help motivate patient to participate.  Pattricia Bossnne Maryella Abood, SPT Acute Rehab Services [phone]  Pattricia Bossnne Dakai Braithwaite 05/07/2018, 2:18 PM

## 2018-05-07 NOTE — Discharge Instructions (Signed)

## 2018-05-07 NOTE — H&P (View-Only) (Signed)
PROGRESS NOTE    Wayne Buckley  WUJ:[id number] DOB: 12/20/1950 DOA: 05/02/2018 PCP: Daisy Florooss, Charles Alan, MD   Brief Narrative: Wayne Buckley is a 67 y.o. male with medical history significant of traumatic brain injury, central cord syndrome, neurogenic bladder with indwelling Foley catheter, bilateral leg weakness, prior DVT, hypertension, alcohol abuse. He presented secondary to fever. He was found to have MRSA in his urine prior to arrival. Blood cultures positive for MRSA and CT abdomen concerning for ileus.   Assessment & Plan:   Principal Problem:   Ogilvie's syndrome Active Problems:   Alcohol abuse   AKI (acute kidney injury) (HCC)   Central cord syndrome (HCC)   Essential hypertension   Sepsis (HCC)   Chronic anemia   Hyponatremia   Sacral ulcer (HCC)   History of DVT (deep vein thrombosis)   Traumatic brain injury (HCC)   MRSA bacteremia   Sepsis Secondary to UTI and MRSA bacteremia. Physiology improved.  MRSA bacteremia MRSA UTI Patient empirically treated with Vancomycin and cefepime. Transthoracic Echocardiogram without evidence of vegetations per report. Repeat Bcx (11/15) no growth to date -Continue Vancomycin -ID recommendations: Transesophageal Echocardiogram planned for 05/08/18 -CIR consult placed  Acute kidney injury Baseline creatinine of <1. Creatinine of 1.67 on admission. Resolved  Ileus General surgery has evaluated. Questioning possible mechanical component vs Ogilvie syndrome. Abdomen is significantly distended. Patient has had a bowel movement today with flatus. -Diet advanced -GI recommendations: enemas/suppositories, keep potassium >4 -General surgery recommendations: GI; signed off. -Continue D5 1/2 NS with potassium (expires 72 hours after hanging)  Chronic anemia Likely chronic anemia. Slight drop in setting of fluid resuscitation. No evidence of bleeding. Stable.  History of DVT Patient is on Eliquis as an outpatient. Currently  NPO. -Hold Eliquis until able to take food by mouth -Continue Heparin while NPO  History of TBI -PT eval: SNF  Essential hypertension Normotensive. -Continue to hold home antihypertensives  History of alcohol abuse [facility] Discontinued CIWA  Pressure injury DTI, bilateral buttocks, POA  Urinary retention Appears to be related to cord compression. Patient is meant to follow with urology as an outpatient. Plan for voiding trial prior to discharge.  DVT prophylaxis: Heparin drip Code Status:   Code Status: Full Code Family Communication: [name] Disposition Plan: Discharge to SNF in several days   Consultants:   General surgery  Infectious disease  Gastroenterology  Procedures:   Transthoracic Echocardiogram (11/16) Study Conclusions  - Left ventricle: The cavity size was normal. Systolic function was   normal. The estimated ejection fraction was in the range of 60%   to 65%. Wall motion was normal; there were no regional wall   motion abnormalities. - Left atrium: The atrium was mildly dilated. - Atrial septum: There was increased thickness of the septum,   consistent with lipomatous hypertrophy. - Pulmonary arteries: Systolic pressure could not be accurately   estimated.  Antimicrobials:  Vancomycin (11/14>>  Cefepime (11/14)    Subjective: No abdominal pain. No issues overnight.  Objective: Vitals:   05/06/18 1750 05/06/18 2050 05/07/18 0453 05/07/18 0856  BP: 133/82 (!) 158/90 118/83 (!) 146/85  Pulse: (!) 103 97 94 92  Resp: 18 (!) 22 15 18   Temp: 98.6 F (37 C) 98.9 F (37.2 C) 97.8 F (36.6 C) 98.9 F (37.2 C)  TempSrc: Oral  Oral Oral  SpO2: 97% 99% 99% 94%  Weight:        Intake/Output Summary (Last 24 hours) at 05/07/2018  1119 Last data filed at 05/07/2018 0600 Gross per 24 hour  Intake 1775.59 ml  Output 1050 ml  Net 725.59 ml   Filed Weights   05/02/18 1413 05/02/18 2215 05/04/18 2314  Weight: 90.7  kg 91.2 kg 90.5 kg    Examination:  General exam: Appears calm and comfortable Respiratory system: Clear to auscultation. Respiratory effort normal. Cardiovascular system: S1 & S2 heard, RRR. No murmurs, rubs, gallops or clicks. Gastrointestinal system: Abdomen is distended, soft and nontender. Normal bowel sounds heard. Central nervous system: Alert and oriented. Dysarthria Extremities: No edema. No calf tenderness Skin: No cyanosis. No rashes Psychiatry: Judgement and insight appear normal. Mood & affect appropriate   Data Reviewed: I have personally reviewed following labs and imaging studies  CBC: Recent Labs  Lab 05/02/18 1402 05/03/18 0533 05/04/18 0555 05/05/18 0358 05/06/18 0538 05/07/18 0403  WBC 7.8 7.3 9.7 12.9* 12.5* 15.2*  NEUTROABS 6.5  --   --   --   --   --   HGB 9.7* 8.9* 9.0* 8.7* 8.4* 8.4*  HCT 30.2* 28.1* 28.0* 28.0* 26.4* 26.7*  MCV 88.6 87.0 87.2 88.3 88.0 89.0  PLT 253 256 320 468* 580* 755*   Basic Metabolic Panel: Recent Labs  Lab 05/03/18 0533 05/04/18 0555 05/05/18 1018 05/05/18 1950 05/06/18 0538 05/06/18 1616 05/07/18 0403  NA 134* 136 138  --  138  --  138  K 3.3* 3.3* 3.5 3.7 3.8 3.9 3.8  CL 104 110 113*  --  114*  --  112*  CO2 21* 20* 20*  --  18*  --  21*  GLUCOSE 110* 128* 101*  --  107*  --  107*  BUN 68* 46* 20  --  12  --  8  CREATININE 1.16 0.85 0.69  --  0.75  --  0.79  CALCIUM 8.4* 8.5* 8.3*  --  7.8*  --  8.0*  MG 2.2  --   --   --   --   --   --   PHOS 2.9  --   --   --   --   --   --    GFR: Estimated Creatinine Clearance: 101.8 mL/min (by C-G formula based on SCr of 0.79 mg/dL). Liver Function Tests: Recent Labs  Lab 05/02/18 1402  AST 83*  ALT 36  ALKPHOS 67  BILITOT 0.5  PROT 6.1*  ALBUMIN 2.3*   No results for input(s): LIPASE, AMYLASE in the last 168 hours. No results for input(s): AMMONIA in the last 168 hours. Coagulation Profile: No results for input(s): INR, PROTIME in the last 168  hours. Cardiac Enzymes: No results for input(s): CKTOTAL, CKMB, CKMBINDEX, TROPONINI in the last 168 hours. BNP (last 3 results) No results for input(s): PROBNP in the last 8760 hours. HbA1C: No results for input(s): HGBA1C in the last 72 hours. CBG: Recent Labs  Lab 05/03/18 0823  GLUCAP 97   Lipid Profile: No results for input(s): CHOL, HDL, LDLCALC, TRIG, CHOLHDL, LDLDIRECT in the last 72 hours. Thyroid Function Tests: No results for input(s): TSH, T4TOTAL, FREET4, T3FREE, THYROIDAB in the last 72 hours. Anemia Panel: No results for input(s): VITAMINB12, FOLATE, FERRITIN, TIBC, IRON, RETICCTPCT in the last 72 hours. Sepsis Labs: Recent Labs  Lab 05/02/18 1412 05/02/18 1644 05/02/18 2344  PROCALCITON  --   --  6.29  LATICACIDVEN 1.13 1.66  --     Recent Results (from the past 240 hour(s))  Blood Culture (routine x 2)  Status: Abnormal   Collection Time: 05/02/18  1:46 PM  Result Value Ref Range Status   Specimen Description BLOOD RIGHT ANTECUBITAL  Final   Special Requests   Final    BOTTLES DRAWN AEROBIC AND ANAEROBIC Blood Culture adequate volume   Culture  Setup Time   Final    GRAM POSITIVE COCCI IN CLUSTERS IN BOTH AEROBIC AND ANAEROBIC BOTTLES CRITICAL VALUE NOTED.  VALUE IS CONSISTENT WITH PREVIOUSLY REPORTED AND CALLED VALUE. Performed at [facility], [address], [place], [place] [address]    Culture STAPHYLOCOCCUS AUREUS (A)  Final   Report Status 05/05/2018 FINAL  Final  Blood Culture (routine x 2)     Status: Abnormal   Collection Time: 05/02/18  2:03 PM  Result Value Ref Range Status   Specimen Description BLOOD LEFT ANTECUBITAL  Final   Special Requests   Final    BOTTLES DRAWN AEROBIC AND ANAEROBIC Blood Culture adequate volume   Culture  Setup Time   Final    GRAM POSITIVE COCCI IN CLUSTERS IN BOTH AEROBIC AND ANAEROBIC BOTTLES CRITICAL RESULT CALLED TO, READ BACK BY AND VERIFIED WITH: Gay Filler PharmD 10:05 05/03/18  ([email or web address]) Performed at [facility], [address], [place], [place] [address]    Culture METHICILLIN RESISTANT STAPHYLOCOCCUS AUREUS (A)  Final   Report Status 05/05/2018 FINAL  Final   Organism ID, Bacteria METHICILLIN RESISTANT STAPHYLOCOCCUS AUREUS  Final      Susceptibility   Methicillin resistant staphylococcus aureus - MIC*    CIPROFLOXACIN >=8 RESISTANT Resistant     ERYTHROMYCIN >=8 RESISTANT Resistant     GENTAMICIN <=0.5 SENSITIVE Sensitive     OXACILLIN >=4 RESISTANT Resistant     TETRACYCLINE <=1 SENSITIVE Sensitive     VANCOMYCIN 1 SENSITIVE Sensitive     TRIMETH/SULFA >=320 RESISTANT Resistant     CLINDAMYCIN <=0.25 SENSITIVE Sensitive     RIFAMPIN <=0.5 SENSITIVE Sensitive     Inducible Clindamycin NEGATIVE Sensitive     * METHICILLIN RESISTANT STAPHYLOCOCCUS AUREUS  Blood Culture ID Panel (Reflexed)     Status: Abnormal   Collection Time: 05/02/18  2:03 PM  Result Value Ref Range Status   Enterococcus species NOT DETECTED NOT DETECTED Final   Listeria monocytogenes NOT DETECTED NOT DETECTED Final   Staphylococcus species DETECTED (A) NOT DETECTED Final    Comment: CRITICAL RESULT CALLED TO, READ BACK BY AND VERIFIED WITH: Gay Filler PharmD 10:05 05/03/18 ([email or web address])    Staphylococcus aureus (BCID) DETECTED (A) NOT DETECTED Final    Comment: Methicillin (oxacillin)-resistant Staphylococcus aureus (MRSA). MRSA is predictably resistant to beta-lactam antibiotics (except ceftaroline). Preferred therapy is vancomycin unless clinically contraindicated. Patient requires contact precautions if  hospitalized. CRITICAL RESULT CALLED TO, READ BACK BY AND VERIFIED WITH: Gay Filler PharmD 10:05 05/03/18 ([email or web address])    Methicillin resistance DETECTED (A) NOT DETECTED Final    Comment: CRITICAL RESULT CALLED TO, READ BACK BY AND VERIFIED WITH: Gay Filler PharmD 10:05 05/03/18 ([email or web address])    Streptococcus species NOT DETECTED NOT DETECTED Final   Streptococcus agalactiae NOT DETECTED  NOT DETECTED Final   Streptococcus pneumoniae NOT DETECTED NOT DETECTED Final   Streptococcus pyogenes NOT DETECTED NOT DETECTED Final   Acinetobacter baumannii NOT DETECTED NOT DETECTED Final   Enterobacteriaceae species NOT DETECTED NOT DETECTED Final   Enterobacter cloacae complex NOT DETECTED NOT DETECTED Final   Escherichia coli NOT DETECTED NOT DETECTED Final   Klebsiella oxytoca NOT DETECTED NOT DETECTED Final   Klebsiella pneumoniae NOT  DETECTED NOT DETECTED Final   Proteus species NOT DETECTED NOT DETECTED Final   Serratia marcescens NOT DETECTED NOT DETECTED Final   Haemophilus influenzae NOT DETECTED NOT DETECTED Final   Neisseria meningitidis NOT DETECTED NOT DETECTED Final   Pseudomonas aeruginosa NOT DETECTED NOT DETECTED Final   Candida albicans NOT DETECTED NOT DETECTED Final   Candida glabrata NOT DETECTED NOT DETECTED Final   Candida krusei NOT DETECTED NOT DETECTED Final   Candida parapsilosis NOT DETECTED NOT DETECTED Final   Candida tropicalis NOT DETECTED NOT DETECTED Final    Comment: Performed at [facility], [address], [place], [place] [address]  MRSA PCR Screening     Status: Abnormal   Collection Time: 05/03/18  6:12 AM  Result Value Ref Range Status   MRSA by PCR POSITIVE (A) NEGATIVE Final    Comment:        The GeneXpert MRSA Assay (FDA approved for NASAL specimens only), is one component of a comprehensive MRSA colonization surveillance program. It is not intended to diagnose MRSA infection nor to guide or monitor treatment for MRSA infections. RESULT CALLED TO, READ BACK BY AND VERIFIED WITH: Lyla Glassing RN 9:40 05/03/18 ([email or web address]) Performed at [facility], [address] [place], [place] [address]   Culture, blood (routine x 2)     Status: None (Preliminary result)   Collection Time: 05/03/18 10:01 PM  Result Value Ref Range Status   Specimen Description BLOOD RIGHT HAND  Final   Special Requests   Final    BOTTLES DRAWN  AEROBIC AND ANAEROBIC Blood Culture adequate volume   Culture   Final    NO GROWTH 3 DAYS Performed at [facility], [address] [place], [place] [address]    Report Status PENDING  Incomplete  Culture, blood (routine x 2)     Status: None (Preliminary result)   Collection Time: 05/03/18 10:09 PM  Result Value Ref Range Status   Specimen Description BLOOD LEFT HAND  Final   Special Requests   Final    BOTTLES DRAWN AEROBIC ONLY Blood Culture adequate volume   Culture   Final    NO GROWTH 3 DAYS Performed at [facility], [address], [place], [place] [address]    Report Status PENDING  Incomplete         Radiology Studies: No results found.      Scheduled Meds: . apixaban  5 mg Oral BID  . bisacodyl  10 mg Rectal Daily  . Chlorhexidine Gluconate Cloth  6 each Topical Q0600  . collagenase   Topical Daily  . liver oil-zinc oxide   Topical QID  . mupirocin ointment  1 application Nasal BID  . simethicone  40 mg Oral QID   Continuous Infusions: . dexrose 5 % and 0.45 % NaCl with KCl 30 mEq/L 75 mL/hr at 05/07/18 0709  . vancomycin 750 mg (05/07/18 0352)     LOS: 5 days     Jacquelin Hawking, MD [facility] 05/07/2018, 11:19 AM  If 7PM-7AM, please contact night-coverage [email or web address]

## 2018-05-08 DIAGNOSIS — K567 Ileus, unspecified: Secondary | ICD-10-CM

## 2018-05-08 LAB — APTT: APTT: 36 s (ref 24–36)

## 2018-05-08 LAB — CULTURE, BLOOD (ROUTINE X 2)
CULTURE: NO GROWTH
Culture: NO GROWTH
SPECIAL REQUESTS: ADEQUATE
Special Requests: ADEQUATE

## 2018-05-08 LAB — BASIC METABOLIC PANEL
Anion gap: 6 (ref 5–15)
CHLORIDE: 110 mmol/L (ref 98–111)
CO2: 21 mmol/L — ABNORMAL LOW (ref 22–32)
CREATININE: 0.84 mg/dL (ref 0.61–1.24)
Calcium: 8.3 mg/dL — ABNORMAL LOW (ref 8.9–10.3)
GFR calc Af Amer: >60 mL/min (ref >60)
Glucose, Bld: 122 mg/dL — ABNORMAL HIGH (ref 70–99)
POTASSIUM: 4 mmol/L (ref 3.5–5.1)
SODIUM: 137 mmol/L (ref 135–145)

## 2018-05-08 LAB — CBC
HCT: 29.3 % — ABNORMAL LOW (ref 39.0–52.0)
HEMOGLOBIN: 9.2 g/dL — AB (ref 13.0–17.0)
MCH: 27.7 pg (ref 26.0–34.0)
MCHC: 31.4 g/dL (ref 30.0–36.0)
MCV: 88.3 fL (ref 80.0–100.0)
Platelets: 899 10*3/uL — ABNORMAL HIGH (ref 150–400)
RBC: 3.32 MIL/uL — ABNORMAL LOW (ref 4.22–5.81)
RDW: 14.9 % (ref 11.5–15.5)
WBC: 19 10*3/uL — AB (ref 4.0–10.5)
nRBC: 0 % (ref 0.0–0.2)

## 2018-05-08 LAB — VANCOMYCIN, TROUGH: Vancomycin Tr: 17 ug/mL (ref 15–20)

## 2018-05-08 MED ORDER — AMLODIPINE BESYLATE 10 MG PO TABS
10.0000 mg | ORAL_TABLET | Freq: Every day | ORAL | Status: DC
  Administered 2018-05-08 – 2018-05-31 (×24): 10 mg via ORAL
  Filled 2018-05-08 (×23): qty 1

## 2018-05-08 MED ORDER — CARVEDILOL 12.5 MG PO TABS
12.5000 mg | ORAL_TABLET | Freq: Two times a day (BID) | ORAL | Status: DC
  Administered 2018-05-08 – 2018-05-31 (×47): 12.5 mg via ORAL
  Filled 2018-05-08 (×33): qty 1
  Filled 2018-05-08: qty 2
  Filled 2018-05-08 (×13): qty 1

## 2018-05-08 NOTE — Progress Notes (Signed)
Pharmacy Antibiotic Note  Wayne Buckley is a 67 y.o. male on day # 7 Vancomycin for MRSA bacteremia with unclear source.  Planning minimun 4 weeks of therapy per ID.     Vanc trough level 17 mcg/ml today, on target.  Plan:  Continue Vancomycin 750mg  IV q12h  Monitor renal function, clinical progress  Target Vanc troughs 15-20 mcg/ml  Vanc trough level at least weekly.   Weight: 199 lb 8.3 oz (90.5 kg)  Temp (24hrs), Avg:99.4 F (37.4 C), Min:98.4 F (36.9 C), Max:100.9 F (38.3 C)  Recent Labs  Lab 05/02/18 1412 05/02/18 1644  05/04/18 0555 05/05/18 0358 05/05/18 1018 05/05/18 1528 05/06/18 0538 05/07/18 0403 05/08/18 0522 05/08/18 1423  WBC  --   --    < > 9.7 12.9*  --   --  12.5* 15.2* 19.0*  --   CREATININE  --   --    < > 0.85  --  0.69  --  0.75 0.79 0.84  --   LATICACIDVEN 1.13 1.66  --   --   --   --   --   --   --   --   --   VANCOTROUGH  --   --   --   --   --   --  16  --   --   --  17   < > = values in this interval not displayed.    Estimated Creatinine Clearance: 96.9 mL/min (by C-G formula based on SCr of 0.84 mg/dL).    No Known Allergies  Antimicrobials this admission:  Vancomycin 11/14  >>   Cefepime 11/14 >> 11/15   Dose adjustments this admission:   11/17 VT 16 mcg/ml (30 min late) - on Vanc 750 mg IV q12h > no change   11/20 VT 17 mcg/ml - on Vanc 750 mg IV q12h > no change  Culture data:   11/14 Blood x 2 -  MRSA  11/15 MRSA PCR  positive, CHG/Bactroban 11/16>>11/20 11/15 Blood x 2 - negative  Wayne Buckley, Wayne Buckley, [place] Pager: [phone] 05/08/2018 7:27 PM

## 2018-05-08 NOTE — Progress Notes (Signed)
Physical Therapy Treatment Patient Details Name: Wayne Buckley MRN: [id number] DOB: [birth year] Today's Date: 05/08/2018    History of Present Illness Pt is a 67 y/o male with a PMH signficant for TBI, central cord syndrome, neurogenic bladder with indwelling foley catheter, BLE weakness, prior DVT, HTN, alcohol abuse presenting to the hospital for evaluation of fever. In ED, urine culture grew MRSA and CT abdomen revealed possible large bowel dysmotility or ileus.     PT Comments    Pt progressing towards physical therapy goals. PT planned session around wife's arrival as she is a big motivator for pt to participate. We were able to progress to OOB to chair with Stedy and +2 assist. Pt was able to demonstrate bed mobility and transfers with +2 assist, however wife reports improvement from last attempt with the [facility]. Will continue to follow and progress as able per POC.   Follow Up Recommendations  SNF;Supervision/Assistance - 24 hour     Equipment Recommendations  None recommended by PT    Recommendations for Other Services       Precautions / Restrictions Precautions Precautions: Fall Restrictions Weight Bearing Restrictions: No    Mobility  Bed Mobility Overal bed mobility: Needs Assistance Bed Mobility: Rolling;Sidelying to Sit Rolling: Mod assist;+2 for physical assistance Sidelying to sit: Mod assist;+2 for physical assistance;HOB elevated       General bed mobility comments: Heavy +2 mod to transition to EOB. Pt requiring increased tactile cueing into physical assist to initiate.   Transfers Overall transfer level: Needs assistance   Transfers: Sit to/from Stand Sit to Stand: Mod assist;+2 physical assistance;From elevated surface         General transfer comment: VC's for sequencing and safety. Pt required assist to position feet on footplate on Stedy, and +2 assist to power-up to full stand. Pt impulsive with reaching for [facility] center bar to initiate  pull to stand. Required increased cues and assist to ensure pt not pulling his hips too close to the edge prior to being ready to stand.   Ambulation/Gait             General Gait Details: Unable   Social research officer, government Rankin (Stroke Patients Only)       Balance Overall balance assessment: Needs assistance Sitting-balance support: Bilateral upper extremity supported;Feet supported Sitting balance-[name] Scale: Poor     Standing balance support: Bilateral upper extremity supported;During functional activity Standing balance-[name] Scale: Zero Standing balance comment: +2 assist required                            Cognition Arousal/Alertness: Awake/alert Behavior During Therapy: Restless Overall Cognitive Status: History of cognitive impairments - at baseline                                 General Comments: Wife present during session and a big motivator for pt. Would recommend wife be present for future therapy sessions      Exercises General Exercises - Upper Extremity Shoulder Flexion: 10 reps;Right;Left(alternating R and L to various targets) General Exercises - Lower Extremity Ankle Circles/Pumps: Strengthening;10 reps;Both Quad Sets: 10 reps;Both    General Comments        Pertinent Vitals/Pain Pain Assessment: Faces Faces Pain Scale: Hurts a little bit Pain Location: General  grimacing with movement Pain Descriptors / Indicators: Grimacing;Guarding Pain Intervention(s): Monitored during session;Repositioned    Home Living                      Prior Function            PT Goals (current goals can now be found in the care plan section) Acute Rehab PT Goals Patient Stated Goal: to go back to rehab PT Goal Formulation: With family Time For Goal Achievement: 05/17/18 Potential to Achieve Goals: Good Progress towards PT goals: Progressing toward goals    Frequency    Min  2X/week      PT Plan Current plan remains appropriate    Co-evaluation PT/OT/SLP Co-Evaluation/Treatment: Yes Reason for Co-Treatment: Complexity of the patient's impairments (multi-system involvement);For patient/therapist safety;To address functional/ADL transfers PT goals addressed during session: Mobility/safety with mobility;Balance;Proper use of DME;Strengthening/ROM        AM-PAC PT "6 Clicks" Daily Activity  Outcome Measure  Difficulty turning over in bed (including adjusting bedclothes, sheets and blankets)?: Unable Difficulty moving from lying on back to sitting on the side of the bed? : Unable Difficulty sitting down on and standing up from a chair with arms (e.g., wheelchair, bedside commode, etc,.)?: Unable Help needed moving to and from a bed to chair (including a wheelchair)?: Total Help needed walking in hospital room?: Total Help needed climbing 3-5 steps with a railing? : Total 6 Click Score: 6    End of Session Equipment Utilized During Treatment: Gait belt Activity Tolerance: Patient tolerated treatment well Patient left: in chair;with call bell/phone within reach;with chair alarm set;with family/visitor present Nurse Communication: Mobility status;Need for lift equipment PT Visit Diagnosis: History of falling (Z91.81);Difficulty in walking, not elsewhere classified (R26.2)     Time: [id number] PT Time Calculation (min) (ACUTE ONLY): 30 min  Charges:  $Gait Training: 8-22 mins                     Conni SlipperLaura Jadalee Westcott, PT, DPT Acute Rehabilitation Services Pager: [phone] Office: [phone]    Marylynn PearsonLaura D Syla Devoss 05/08/2018, 2:52 PM

## 2018-05-08 NOTE — Progress Notes (Signed)
[facility]  Date of Admission:  05/02/2018     Total days of antibiotics 6         ASSESSMENT/PLAN  [name] continues to receive treatment for MRSA bacteremia of unclear source and is on Day 6 of therapy with vancomycin. Renal function is stable and vancomycin trough is therapeutic. Source of infection remains unclear at present and unable to rule out endocarditis with TEE due to stricture. He did have a fever last night and increasing WBC count which is concerning. If fevers persist will need to consider imaging his back and sacrum in the area of the wound. Will plan for prolonged therapy with vancomycin for at least 6 weeks. Repeat cultures have remained without growth to date.  1. Continue vancomycin. 2. Monitor fever curve and WBC count. 3. Consider additional imaging for source of infection. 4. Will need central line access - PICC versus tunneled catheter.   Principal Problem:   Ogilvie's syndrome Active Problems:   Alcohol abuse   AKI (acute kidney injury) (HCC)   Central cord syndrome (HCC)   Essential hypertension   Sepsis (HCC)   Chronic anemia   Hyponatremia   Sacral ulcer (HCC)   History of DVT (deep vein thrombosis)   Traumatic brain injury (HCC)   MRSA bacteremia   Bacteremia   . amLODipine  10 mg Oral Daily  . apixaban  5 mg Oral BID  . bisacodyl  10 mg Rectal Daily  . carvedilol  12.5 mg Oral BID WC  . collagenase   Topical Daily  . liver oil-zinc oxide   Topical QID  . mupirocin ointment  1 application Nasal BID    SUBJECTIVE:  Febrile overnight with max temperature of 100.8 and increasing white blood cell count of 19.  Repeat blood cultures with no growth to date.  Unable to complete TEE secondary to stricture.  No Known Allergies   Review of Systems: Review of Systems  Constitutional: Positive for fever. Negative for chills.  Respiratory: Negative for cough, shortness of breath and wheezing.   Cardiovascular:  Negative for chest pain and leg swelling.  Gastrointestinal: Negative for abdominal pain, diarrhea, nausea and vomiting.  Skin: Negative for rash.  Neurological: Negative for headaches.      OBJECTIVE: Vitals:   05/08/18 0310 05/08/18 0924 05/08/18 1015 05/08/18 1147  BP: (!) 160/100 (!) 151/102 (!) 155/107 (!) 161/96  Pulse: (!) 114 (!) 111 (!) 111 (!) 116  Resp: 18 18    Temp: 98.9 F (37.2 C) 98.7 F (37.1 C)    TempSrc: Oral Oral    SpO2: 99% 96%    Weight:       Body mass index is 27.83 kg/m.  Physical Exam  Constitutional: He is oriented to person, place, and time. He appears well-developed and well-nourished. No distress.  Seated in the chair. Restless and wanting to get back to bed  Cardiovascular: Normal rate, regular rhythm, normal heart sounds and intact distal pulses. Exam reveals no gallop and no friction rub.  No murmur heard. Pulmonary/Chest: Effort normal and breath sounds normal. He has no wheezes. He has no rales. He exhibits no tenderness.  Abdominal: Soft. Bowel sounds are normal. He exhibits no mass. There is no tenderness. There is no guarding.  Neurological: He is alert and oriented to person, place, and time.  Skin: Skin is warm and dry.  Psychiatric: He has a normal mood and affect. He expresses impulsivity.    Lab Results  Lab Results  Component Value Date   WBC 19.0 (H) 05/08/2018   HGB 9.2 (L) 05/08/2018   HCT 29.3 (L) 05/08/2018   MCV 88.3 05/08/2018   PLT 899 (H) 05/08/2018    Lab Results  Component Value Date   CREATININE 0.84 05/08/2018   BUN <5 (L) 05/08/2018   NA 137 05/08/2018   K 4.0 05/08/2018   CL 110 05/08/2018   CO2 21 (L) 05/08/2018    Lab Results  Component Value Date   ALT 36 05/02/2018   AST 83 (H) 05/02/2018   ALKPHOS 67 05/02/2018   BILITOT 0.5 05/02/2018     Microbiology: Recent Results (from the past 240 hour(s))  Blood Culture (routine x 2)     Status: Abnormal   Collection Time: 05/02/18  1:46 PM    Result Value Ref Range Status   Specimen Description BLOOD RIGHT ANTECUBITAL  Final   Special Requests   Final    BOTTLES DRAWN AEROBIC AND ANAEROBIC Blood Culture adequate volume   Culture  Setup Time   Final    GRAM POSITIVE COCCI IN CLUSTERS IN BOTH AEROBIC AND ANAEROBIC BOTTLES CRITICAL VALUE NOTED.  VALUE IS CONSISTENT WITH PREVIOUSLY REPORTED AND CALLED VALUE. Performed at [facility], [address], [place], [place] [address]    Culture STAPHYLOCOCCUS AUREUS (A)  Final   Report Status 05/05/2018 FINAL  Final  Blood Culture (routine x 2)     Status: Abnormal   Collection Time: 05/02/18  2:03 PM  Result Value Ref Range Status   Specimen Description BLOOD LEFT ANTECUBITAL  Final   Special Requests   Final    BOTTLES DRAWN AEROBIC AND ANAEROBIC Blood Culture adequate volume   Culture  Setup Time   Final    GRAM POSITIVE COCCI IN CLUSTERS IN BOTH AEROBIC AND ANAEROBIC BOTTLES CRITICAL RESULT CALLED TO, READ BACK BY AND VERIFIED WITH: Gay Filler PharmD 10:05 05/03/18 ([email or web address]) Performed at [facility], [address], [place], [place] [address]    Culture METHICILLIN RESISTANT STAPHYLOCOCCUS AUREUS (A)  Final   Report Status 05/05/2018 FINAL  Final   Organism ID, Bacteria METHICILLIN RESISTANT STAPHYLOCOCCUS AUREUS  Final      Susceptibility   Methicillin resistant staphylococcus aureus - MIC*    CIPROFLOXACIN >=8 RESISTANT Resistant     ERYTHROMYCIN >=8 RESISTANT Resistant     GENTAMICIN <=0.5 SENSITIVE Sensitive     OXACILLIN >=4 RESISTANT Resistant     TETRACYCLINE <=1 SENSITIVE Sensitive     VANCOMYCIN 1 SENSITIVE Sensitive     TRIMETH/SULFA >=320 RESISTANT Resistant     CLINDAMYCIN <=0.25 SENSITIVE Sensitive     RIFAMPIN <=0.5 SENSITIVE Sensitive     Inducible Clindamycin NEGATIVE Sensitive     * METHICILLIN RESISTANT STAPHYLOCOCCUS AUREUS  Blood Culture ID Panel (Reflexed)     Status: Abnormal   Collection Time: 05/02/18  2:03 PM  Result Value Ref Range  Status   Enterococcus species NOT DETECTED NOT DETECTED Final   Listeria monocytogenes NOT DETECTED NOT DETECTED Final   Staphylococcus species DETECTED (A) NOT DETECTED Final    Comment: CRITICAL RESULT CALLED TO, READ BACK BY AND VERIFIED WITH: Gay Filler PharmD 10:05 05/03/18 ([email or web address])    Staphylococcus aureus (BCID) DETECTED (A) NOT DETECTED Final    Comment: Methicillin (oxacillin)-resistant Staphylococcus aureus (MRSA). MRSA is predictably resistant to beta-lactam antibiotics (except ceftaroline). Preferred therapy is vancomycin unless clinically contraindicated. Patient requires contact precautions if  hospitalized. CRITICAL RESULT CALLED TO, READ  BACK BY AND VERIFIED WITH: Gay Filler PharmD 10:05 05/03/18 ([email or web address])    Methicillin resistance DETECTED (A) NOT DETECTED Final    Comment: CRITICAL RESULT CALLED TO, READ BACK BY AND VERIFIED WITH: Gay Filler PharmD 10:05 05/03/18 ([email or web address])    Streptococcus species NOT DETECTED NOT DETECTED Final   Streptococcus agalactiae NOT DETECTED NOT DETECTED Final   Streptococcus pneumoniae NOT DETECTED NOT DETECTED Final   Streptococcus pyogenes NOT DETECTED NOT DETECTED Final   Acinetobacter baumannii NOT DETECTED NOT DETECTED Final   Enterobacteriaceae species NOT DETECTED NOT DETECTED Final   Enterobacter cloacae complex NOT DETECTED NOT DETECTED Final   Escherichia coli NOT DETECTED NOT DETECTED Final   Klebsiella oxytoca NOT DETECTED NOT DETECTED Final   Klebsiella pneumoniae NOT DETECTED NOT DETECTED Final   Proteus species NOT DETECTED NOT DETECTED Final   Serratia marcescens NOT DETECTED NOT DETECTED Final   Haemophilus influenzae NOT DETECTED NOT DETECTED Final   Neisseria meningitidis NOT DETECTED NOT DETECTED Final   Pseudomonas aeruginosa NOT DETECTED NOT DETECTED Final   Candida albicans NOT DETECTED NOT DETECTED Final   Candida glabrata NOT DETECTED NOT DETECTED Final   Candida krusei NOT DETECTED NOT DETECTED Final   Candida  parapsilosis NOT DETECTED NOT DETECTED Final   Candida tropicalis NOT DETECTED NOT DETECTED Final    Comment: Performed at [facility], [address], [place], [place] [address]  MRSA PCR Screening     Status: Abnormal   Collection Time: 05/03/18  6:12 AM  Result Value Ref Range Status   MRSA by PCR POSITIVE (A) NEGATIVE Final    Comment:        The GeneXpert MRSA Assay (FDA approved for NASAL specimens only), is one component of a comprehensive MRSA colonization surveillance program. It is not intended to diagnose MRSA infection nor to guide or monitor treatment for MRSA infections. RESULT CALLED TO, READ BACK BY AND VERIFIED WITH: Lyla Glassing RN 9:40 05/03/18 ([email or web address]) Performed at [facility], [address], [place], [place] [address]   Culture, blood (routine x 2)     Status: None (Preliminary result)   Collection Time: 05/03/18 10:01 PM  Result Value Ref Range Status   Specimen Description BLOOD RIGHT HAND  Final   Special Requests   Final    BOTTLES DRAWN AEROBIC AND ANAEROBIC Blood Culture adequate volume   Culture   Final    NO GROWTH 4 DAYS Performed at [facility], [address] [place], [place] [address]    Report Status PENDING  Incomplete  Culture, blood (routine x 2)     Status: None (Preliminary result)   Collection Time: 05/03/18 10:09 PM  Result Value Ref Range Status   Specimen Description BLOOD LEFT HAND  Final   Special Requests   Final    BOTTLES DRAWN AEROBIC ONLY Blood Culture adequate volume   Culture   Final    NO GROWTH 4 DAYS Performed at [facility], [address], [place], [place] [address]    Report Status PENDING  Incomplete     Marcos Eke, NP [facility] [phone] Pager  05/08/2018  1:47 PM

## 2018-05-08 NOTE — Progress Notes (Addendum)
Occupational Therapy Treatment Patient Details Name: Wayne Buckley MRN: [id number] DOB: 09-Apr-1951 Today's Date: 05/08/2018    History of present illness Pt is a 67 y/o male with a PMH signficant for TBI, central cord syndrome, neurogenic bladder with indwelling foley catheter, BLE weakness, prior DVT, HTN, alcohol abuse presenting to the hospital for evaluation of fever. In ED, urine culture grew MRSA and CT abdomen revealed possible large bowel dysmotility or ileus.    OT comments  Pt progressing towards established OT goals. Pt performing bed mobility with Mod A +2 and Max cues for following cues and participation. Pt requiring Mod A +2 for sit<>stand with stedy to transfer to recliner. Pt performing grooming tasks while seated in the recliner with Min A. Continue to recommend dc to post-acute rehab (updated to SNF as pt denied CIR) and will continue to follow acutely as admitted.    Follow Up Recommendations  SNF;Supervision/Assistance - 24 hour    Equipment Recommendations  Other (comment)(Defer to next venue)    Recommendations for Other Services      Precautions / Restrictions Precautions Precautions: Fall Restrictions Weight Bearing Restrictions: No       Mobility Bed Mobility Overal bed mobility: Needs Assistance Bed Mobility: Rolling;Sidelying to Sit Rolling: Mod assist;+2 for physical assistance Sidelying to sit: Mod assist;+2 for physical assistance;HOB elevated       General bed mobility comments: Heavy +2 mod to transition to EOB. Pt requiring increased tactile cueing into physical assist to initiate.   Transfers Overall transfer level: Needs assistance   Transfers: Sit to/from Stand Sit to Stand: Mod assist;+2 physical assistance;From elevated surface         General transfer comment: VC's for sequencing and safety. Pt required assist to position feet on footplate on Stedy, and +2 assist to power-up to full stand. Pt impulsive with reaching for [facility]  center bar to initiate pull to stand. Required increased cues and assist to ensure pt not pulling his hips too close to the edge prior to being ready to stand.     Balance Overall balance assessment: Needs assistance Sitting-balance support: Bilateral upper extremity supported;Feet supported Sitting balance-[name] Scale: Poor     Standing balance support: Bilateral upper extremity supported;During functional activity Standing balance-[name] Scale: Zero Standing balance comment: +2 assist required                           ADL either performed or assessed with clinical judgement   ADL Overall ADL's : Needs assistance/impaired     Grooming: Minimal assistance;Sitting;Oral care;Brushing hair Grooming Details (indicate cue type and reason): Min A to manage tooth paste and brush the back of his hair.                  Toilet Transfer: Moderate assistance;+2 for physical assistance(stedy; simulated to recliner) Toilet Transfer Details (indicate cue type and reason): mod A +2 for sit<>stand and then use of stedy. Max cues         Functional mobility during ADLs: Moderate assistance;+2 for physical assistance(sit<>stand with stedy) General ADL Comments: Pt requiring max cues throughout and wife present to increased particiaption in therapy.      Vision       Perception     Praxis      Cognition Arousal/Alertness: Awake/alert Behavior During Therapy: Restless Overall Cognitive Status: History of cognitive impairments - at baseline  General Comments: Wife present during session and a big motivator for pt. Would recommend wife be present for future therapy sessions        Exercises    Shoulder Instructions       General Comments      Pertinent Vitals/ Pain       Pain Assessment: Faces Faces Pain Scale: Hurts a little bit Pain Location: General grimacing with movement Pain Descriptors / Indicators:  Grimacing;Guarding Pain Intervention(s): Monitored during session;Limited activity within patient's tolerance;Repositioned  Home Living                                          Prior Functioning/Environment              Frequency  Min 2X/week        Progress Toward Goals  OT Goals(current goals can now be found in the care plan section)  Progress towards OT goals: Progressing toward goals  Acute Rehab OT Goals Patient Stated Goal: to go back to rehab OT Goal Formulation: With patient/family Time For Goal Achievement: 05/20/18 Potential to Achieve Goals: Good ADL Goals Pt Will Perform Grooming: with set-up;with supervision;sitting(EOB) Pt Will Perform Upper Body Bathing: with set-up;with supervision;sitting(EOB) Pt Will Perform Lower Body Bathing: with mod assist(sit<>stand with +2 A) Pt Will Transfer to Toilet: with mod assist;with +2 assist;stand pivot transfer;bedside commode Additional ADL Goal #1: Pt will be able to come up to sit EOB with Mod A in prep for ADLs and tranfers OOB  Plan Discharge plan needs to be updated    Co-evaluation    PT/OT/SLP Co-Evaluation/Treatment: Yes Reason for Co-Treatment: Complexity of the patient's impairments (multi-system involvement);For patient/therapist safety;To address functional/ADL transfers;Necessary to address cognition/behavior during functional activity PT goals addressed during session: Mobility/safety with mobility;Balance;Proper use of DME;Strengthening/ROM OT goals addressed during session: ADL's and self-care      AM-PAC PT "6 Clicks" Daily Activity     Outcome Measure   Help from another person eating meals?: A Little Help from another person taking care of personal grooming?: A Little Help from another person toileting, which includes using toliet, bedpan, or urinal?: Total Help from another person bathing (including washing, rinsing, drying)?: A Lot Help from another person to put on and  taking off regular upper body clothing?: A Lot Help from another person to put on and taking off regular lower body clothing?: Total 6 Click Score: 12    End of Session Equipment Utilized During Treatment: Other (comment);Gait belt(stedy)  OT Visit Diagnosis: Unsteadiness on feet (R26.81);Other abnormalities of gait and mobility (R26.89);Muscle weakness (generalized) (M62.81);History of falling (Z91.81);Pain;Ataxia, unspecified (R27.0);Other symptoms and signs involving cognitive function   Activity Tolerance Patient tolerated treatment well   Patient Left in chair;with call bell/phone within reach;with chair alarm set;with family/visitor present   Nurse Communication Need for lift equipment        Time: 1350-1420 OT Time Calculation (min): 30 min  Charges: OT General Charges $OT Visit: 1 Visit OT Treatments $Self Care/Home Management : 8-22 mins  Lew Prout MSOT, OTR/L Acute Rehab Pager: [phone] Office: [phone]   Theodoro GristCharis M Navy Rothschild 05/08/2018, 5:17 PM

## 2018-05-08 NOTE — Progress Notes (Signed)
IP rehab admissions - Please see rehab consult done 11/19 by Dr. Riley KillSwartz recommending SNF.  I will sign off for acute inpatient rehab admissions at this time with recommendations for SNF placement.  Call me for questions.  [phone]

## 2018-05-08 NOTE — Progress Notes (Signed)
[facility]                                                                              Patient Demographics  Wayne Buckley, is a 67 y.o. male, DOB - [birth year], YME:[id number]  Admit date - 05/02/2018   Admitting Physician Shela Leff, MD  Outpatient Primary MD for the patient is [name], MD  Outpatient specialists:   LOS - 6  days   Medical records reviewed and are as summarized below:    Chief Complaint  Patient presents with  . Fever       Brief summary    Wayne Buckley is a 67 y.o. malewith medical history significant oftraumatic brain injury, central cord syndrome, neurogenic bladder with indwelling Foley catheter, bilateral leg weakness, prior DVT, hypertension, alcohol abuse. He presented secondary to fever. He was found to have MRSA in his urine prior to arrival. Blood cultures positive for MRSA and CT abdomen concerning for ileus.  Assessment & Plan    Principal Problem: Sepsis -Patient met criteria for sepsis at the time of admission, source is secondary to UTI and MRSA bacteremia -Sepsis physiology now improved  Active problems MRSA bacteremia, MRSA UTI -Patient empirically treated with vancomycin and cefepime. -Repeat blood cultures 11/15 showed no growth -2D echo showed no evidence of vegetations -ID following, recommended IV vancomycin.  TEE unsuccessful due to obstruction, will obtain esophagogram  Acute kidney injury -Resolved, possibly due to #1 and dehydration, creatinine 1.6 at the time of admission, improved   Ileus/abdominal distention -Patient has been followed by general surgery and gastroenterology, differentials including Ogilvie's syndrome, mechanical component -Continue diet -Per GI, continue enemas/suppositories, potassium above 4, signed off  Urinary retention with neurogenic bladder -Per admission history patient with indwelling Foley catheter secondary to neurogenic bladder, TBI and cord  compression - follow outpatient with urology and outpatient voiding trial, patient was supposed to have appointment today on 11/20  Anemia, likely chronic H&H currently stable, improving  Leukocytosis Likely due to #1 and #2, follow closely, blood cultures been negative so far, afebrile  History of DVT Continue Eliquis  History of TBI -Reviewed recommendations from inpatient rehab, recommended skilled nursing facility discussed with patient's wife  Essential hypertension BP somewhat elevated, restart amlodipine and Coreg  History of alcohol use Currently stable, no acute issues  Pressure injury POA, bilateral buttocks, deep tissue injury Wound care per nursing  Code Status: Full CODE STATUS DVT Prophylaxis: Apixaban Family Communication: Discussed in detail with the patient, all imaging results, lab results explained to the patient wife   Disposition Plan: Possible tomorrow  Time Spent in minutes 35 minutes  Procedures:  2D echo EF 60 to 65%, no vegetations  Consultants:   GI Infectious disease General surgery  Antimicrobials:      Medications  Scheduled Meds: . apixaban  5 mg Oral BID  . bisacodyl  10 mg Rectal Daily  . collagenase   Topical Daily  . liver oil-zinc oxide   Topical QID  . mupirocin ointment  1 application Nasal BID   Continuous Infusions: . dexrose 5 % and 0.45 % NaCl with KCl 30 mEq/L  75 mL/hr at 05/08/18 0804  . vancomycin 750 mg (05/08/18 0339)   PRN Meds:.acetaminophen, hydrALAZINE   Antibiotics   Anti-infectives (From admission, onward)   Start     Dose/Rate Route Frequency Ordered Stop   05/03/18 1400  ceFEPIme (MAXIPIME) 2 g in sodium chloride 0.9 % 100 mL IVPB  Status:  Discontinued     2 g 200 mL/hr over 30 Minutes Intravenous Every 24 hours 05/02/18 1526 05/03/18 1026   05/03/18 0330  vancomycin (VANCOCIN) IVPB 750 mg/150 ml premix     750 mg 150 mL/hr over 60 Minutes Intravenous Every 12 hours 05/02/18 1526      05/02/18 1500  vancomycin (VANCOCIN) IVPB 1000 mg/200 mL premix     1,000 mg 200 mL/hr over 60 Minutes Intravenous NOW 05/02/18 1448 05/02/18 1620   05/02/18 1400  vancomycin (VANCOCIN) IVPB 1000 mg/200 mL premix     1,000 mg 200 mL/hr over 60 Minutes Intravenous  Once 05/02/18 1354 05/02/18 1520   05/02/18 1400  ceFEPIme (MAXIPIME) 2 g in sodium chloride 0.9 % 100 mL IVPB     2 g 200 mL/hr over 30 Minutes Intravenous  Once 05/02/18 1354 05/02/18 1445        Subjective:   Wayne Buckley was seen and examined today.  Afebrile, no new complaints. Patient denies dizziness, chest pain, shortness of breath, new weakness  Objective:   Vitals:   05/08/18 0310 05/08/18 0924 05/08/18 1015 05/08/18 1147  BP: (!) 160/100 (!) 151/102 (!) 155/107 (!) 161/96  Pulse: (!) 114 (!) 111 (!) 111 (!) 116  Resp: 18 18    Temp: 98.9 F (37.2 C) 98.7 F (37.1 C)    TempSrc: Oral Oral    SpO2: 99% 96%    Weight:        Intake/Output Summary (Last 24 hours) at 05/08/2018 1155 Last data filed at 05/08/2018 0934 Gross per 24 hour  Intake 1198.63 ml  Output 300 ml  Net 898.63 ml     Wt Readings from Last 3 Encounters:  05/04/18 90.5 kg  04/03/18 101.3 kg  03/02/18 99.8 kg     Exam  General: Alert and oriented, NAD  Eyes:  HEENT:  Atraumatic, normocephalic  Cardiovascular: S1 S2 auscultated, Regular rate and rhythm.  Respiratory: Clear to auscultation bilaterally, no wheezing, rales or rhonchi  Gastrointestinal: Soft, nontender, mildly distended, + bowel sounds  Ext: no pedal edema bilaterally  Neuro: no new deficits, dysarthria  Musculoskeletal: No digital cyanosis, clubbing  Skin: No rashes  Psych: Normal affect and demeanor   Data Reviewed:  I have personally reviewed following labs and imaging studies  Micro Results Recent Results (from the past 240 hour(s))  Blood Culture (routine x 2)     Status: Abnormal   Collection Time: 05/02/18  1:46 PM  Result Value Ref Range  Status   Specimen Description BLOOD RIGHT ANTECUBITAL  Final   Special Requests   Final    BOTTLES DRAWN AEROBIC AND ANAEROBIC Blood Culture adequate volume   Culture  Setup Time   Final    GRAM POSITIVE COCCI IN CLUSTERS IN BOTH AEROBIC AND ANAEROBIC BOTTLES CRITICAL VALUE NOTED.  VALUE IS CONSISTENT WITH PREVIOUSLY REPORTED AND CALLED VALUE. Performed at [facility], [place] [address] [place], [place] [address]    Culture STAPHYLOCOCCUS AUREUS (A)  Final   Report Status 05/05/2018 FINAL  Final  Blood Culture (routine x 2)     Status: Abnormal   Collection Time: 05/02/18  2:03 PM  Result Value Ref Range Status   Specimen Description BLOOD LEFT ANTECUBITAL  Final   Special Requests   Final    BOTTLES DRAWN AEROBIC AND ANAEROBIC Blood Culture adequate volume   Culture  Setup Time   Final    GRAM POSITIVE COCCI IN CLUSTERS IN BOTH AEROBIC AND ANAEROBIC BOTTLES CRITICAL RESULT CALLED TO, READ BACK BY AND VERIFIED WITH: Jene Every PharmD 10:05 05/03/18 ([email or web address]) Performed at [facility], [address], [place], [place] [address]    Culture METHICILLIN RESISTANT STAPHYLOCOCCUS AUREUS (A)  Final   Report Status 05/05/2018 FINAL  Final   Organism ID, Bacteria METHICILLIN RESISTANT STAPHYLOCOCCUS AUREUS  Final      Susceptibility   Methicillin resistant staphylococcus aureus - MIC*    CIPROFLOXACIN >=8 RESISTANT Resistant     ERYTHROMYCIN >=8 RESISTANT Resistant     GENTAMICIN <=0.5 SENSITIVE Sensitive     OXACILLIN >=4 RESISTANT Resistant     TETRACYCLINE <=1 SENSITIVE Sensitive     VANCOMYCIN 1 SENSITIVE Sensitive     TRIMETH/SULFA >=320 RESISTANT Resistant     CLINDAMYCIN <=0.25 SENSITIVE Sensitive     RIFAMPIN <=0.5 SENSITIVE Sensitive     Inducible Clindamycin NEGATIVE Sensitive     * METHICILLIN RESISTANT STAPHYLOCOCCUS AUREUS  Blood Culture ID Panel (Reflexed)     Status: Abnormal   Collection Time: 05/02/18  2:03 PM  Result Value Ref Range Status   Enterococcus  species NOT DETECTED NOT DETECTED Final   Listeria monocytogenes NOT DETECTED NOT DETECTED Final   Staphylococcus species DETECTED (A) NOT DETECTED Final    Comment: CRITICAL RESULT CALLED TO, READ BACK BY AND VERIFIED WITH: Jene Every PharmD 10:05 05/03/18 ([email or web address])    Staphylococcus aureus (BCID) DETECTED (A) NOT DETECTED Final    Comment: Methicillin (oxacillin)-resistant Staphylococcus aureus (MRSA). MRSA is predictably resistant to beta-lactam antibiotics (except ceftaroline). Preferred therapy is vancomycin unless clinically contraindicated. Patient requires contact precautions if  hospitalized. CRITICAL RESULT CALLED TO, READ BACK BY AND VERIFIED WITH: Jene Every PharmD 10:05 05/03/18 ([email or web address])    Methicillin resistance DETECTED (A) NOT DETECTED Final    Comment: CRITICAL RESULT CALLED TO, READ BACK BY AND VERIFIED WITH: Jene Every PharmD 10:05 05/03/18 ([email or web address])    Streptococcus species NOT DETECTED NOT DETECTED Final   Streptococcus agalactiae NOT DETECTED NOT DETECTED Final   Streptococcus pneumoniae NOT DETECTED NOT DETECTED Final   Streptococcus pyogenes NOT DETECTED NOT DETECTED Final   Acinetobacter baumannii NOT DETECTED NOT DETECTED Final   Enterobacteriaceae species NOT DETECTED NOT DETECTED Final   Enterobacter cloacae complex NOT DETECTED NOT DETECTED Final   Escherichia coli NOT DETECTED NOT DETECTED Final   Klebsiella oxytoca NOT DETECTED NOT DETECTED Final   Klebsiella pneumoniae NOT DETECTED NOT DETECTED Final   Proteus species NOT DETECTED NOT DETECTED Final   Serratia marcescens NOT DETECTED NOT DETECTED Final   Haemophilus influenzae NOT DETECTED NOT DETECTED Final   Neisseria meningitidis NOT DETECTED NOT DETECTED Final   Pseudomonas aeruginosa NOT DETECTED NOT DETECTED Final   Candida albicans NOT DETECTED NOT DETECTED Final   Candida glabrata NOT DETECTED NOT DETECTED Final   Candida krusei NOT DETECTED NOT DETECTED Final   Candida parapsilosis NOT DETECTED NOT  DETECTED Final   Candida tropicalis NOT DETECTED NOT DETECTED Final    Comment: Performed at [facility], [place]. [address], [place], [place] [address]  MRSA PCR Screening     Status: Abnormal   Collection Time: 05/03/18  6:12  AM  Result Value Ref Range Status   MRSA by PCR POSITIVE (A) NEGATIVE Final    Comment:        The GeneXpert MRSA Assay (FDA approved for NASAL specimens only), is one component of a comprehensive MRSA colonization surveillance program. It is not intended to diagnose MRSA infection nor to guide or monitor treatment for MRSA infections. RESULT CALLED TO, READ BACK BY AND VERIFIED WITH: Precious Bard RN 9:40 05/03/18 ([email or web address]) Performed at [facility], [place] [address] [place], [place] [address]   Culture, blood (routine x 2)     Status: None (Preliminary result)   Collection Time: 05/03/18 10:01 PM  Result Value Ref Range Status   Specimen Description BLOOD RIGHT HAND  Final   Special Requests   Final    BOTTLES DRAWN AEROBIC AND ANAEROBIC Blood Culture adequate volume   Culture   Final    NO GROWTH 4 DAYS Performed at [facility], [place] [address], [place], [place] [address]    Report Status PENDING  Incomplete  Culture, blood (routine x 2)     Status: None (Preliminary result)   Collection Time: 05/03/18 10:09 PM  Result Value Ref Range Status   Specimen Description BLOOD LEFT HAND  Final   Special Requests   Final    BOTTLES DRAWN AEROBIC ONLY Blood Culture adequate volume   Culture   Final    NO GROWTH 4 DAYS Performed at [facility], [place] [address], [place], [place] [address]    Report Status PENDING  Incomplete    Radiology Reports Ct Abdomen Pelvis Wo Contrast  Result Date: 05/02/2018 CLINICAL DATA:  Abdominal distention EXAM: CT ABDOMEN AND PELVIS WITHOUT CONTRAST TECHNIQUE: Multidetector CT imaging of the abdomen and pelvis was performed following the standard protocol without IV contrast. COMPARISON:  Same day  radiographs of the abdomen and pelvis FINDINGS: Lower chest: Top-normal heart size. Trace pleural effusions with bibasilar atelectasis. Hepatobiliary: No focal liver abnormality is seen. No gallstones, gallbladder wall thickening, or biliary dilatation. Pancreas: Unremarkable. No pancreatic ductal dilatation or surrounding inflammatory changes. Spleen: Normal in size without focal abnormality. Adrenals/Urinary Tract: Normal bilateral adrenal glands. No nephrolithiasis. Motion artifacts limit assessment at the level of the kidneys however there does appear to be a hypodense cystic lesion of the interpolar left kidney measuring up to 6 cm. No nephrolithiasis nor hydroureteronephrosis. Foley decompressed urinary bladder. Stomach/Bowel: Small hiatal hernia. Decompressed stomach and small bowel loops. Normal small bowel rotation. Moderate gaseous distention of the colon up to 9 cm at the level of the cecum with liquid stool noted within from cecum to distal descending colon likely representing large bowel dysmotility or ileus. Circumferential thickening surrounding a small stool ball is noted within the rectum. Mild stercoral proctitis is not excluded. Vascular/Lymphatic: IVC filter in place. Atherosclerosis of the aorta without aneurysm. No adenopathy. Reproductive: Normal size prostate. Other: Soft tissue anasarca. No free air or free fluid. Musculoskeletal: Status post ventral hernia repair. Small amount of fat in the right inguinal canal. IMPRESSION: 1. Moderate gaseous distention of the colon up to 9 cm at the level of the cecum with liquid stool noted within from cecum to distal descending colon. Findings may represent a large bowel dysmotility or ileus. Mechanical source of obstruction is identified. 2. Circumferential thickening surrounding a small stool ball within the rectum. Mild stercoral proctitis could have this appearance. 3. 6 cm hypodense cystic lesion of the interpolar left kidney, likely a simple cyst  however further characterization is limited due to motion. 4. IVC filter in place. 5. Mild soft tissue anasarca. Electronically Signed   By: Ashley Royalty M.D.   On: 05/02/2018 16:46   Dg Abdomen 1 View  Result Date: 05/02/2018 CLINICAL DATA:  67 y/o  M; abdominal distention. EXAM: ABDOMEN - 1 VIEW COMPARISON:  05/02/2018 CT abdomen and pelvis. FINDINGS: Diffuse air-filled distention of the large bowel. Upper abdomen is excluded from the field of view. IVC filter in situ. Advanced degenerative changes of the visible lumbar spine. Lower abdominal wall mesh hernia repair anchors noted. IMPRESSION: Diffuse air-filled distention of the large bowel, possibly chronic large bowel dysmotility or ileus. Electronically Signed   By: Kristine Garbe M.D.   On: 05/02/2018 20:39   Dg Chest Port 1 View  Result Date: 05/02/2018 CLINICAL DATA:  Fever EXAM: PORTABLE CHEST 1 VIEW COMPARISON:  03/03/2018 FINDINGS: The heart size and mediastinal contours are within normal limits. Both lungs are clear. The visualized skeletal structures are unremarkable. IMPRESSION: No active disease. Electronically Signed   By: Franchot Gallo M.D.   On: 05/02/2018 14:35   Dg Abd Portable 1v  Result Date: 05/04/2018 CLINICAL DATA:  Follow-up ileus EXAM: PORTABLE ABDOMEN - 1 VIEW COMPARISON:  05/02/2018 FINDINGS: Distended colon is again noted and stable consistent with generalized ileus. No definitive obstructive changes are seen. Postsurgical changes in the low pelvis are noted. Degenerative changes of the lumbar spine are seen. No free air is noted. IMPRESSION: Stable gaseous dilatation of the colon consistent with ileus. Electronically Signed   By: Inez Catalina M.D.   On: 05/04/2018 09:40    Lab Data:  CBC: Recent Labs  Lab 05/02/18 1402  05/04/18 0555 05/05/18 0358 05/06/18 0538 05/07/18 0403 05/08/18 0522  WBC 7.8   < > 9.7 12.9* 12.5* 15.2* 19.0*  NEUTROABS 6.5  --   --   --   --   --   --   HGB 9.7*   < > 9.0*  8.7* 8.4* 8.4* 9.2*  HCT 30.2*   < > 28.0* 28.0* 26.4* 26.7* 29.3*  MCV 88.6   < > 87.2 88.3 88.0 89.0 88.3  PLT 253   < > 320 468* 580* 755* 899*   < > = values in this interval not displayed.   Basic Metabolic Panel: Recent Labs  Lab 05/03/18 0533 05/04/18 0555 05/05/18 1018 05/05/18 1950 05/06/18 0538 05/06/18 1616 05/07/18 0403 05/08/18 0522  NA 134* 136 138  --  138  --  138 137  K 3.3* 3.3* 3.5 3.7 3.8 3.9 3.8 4.0  CL 104 110 113*  --  114*  --  112* 110  CO2 21* 20* 20*  --  18*  --  21* 21*  GLUCOSE 110* 128* 101*  --  107*  --  107* 122*  BUN 68* 46* 20  --  12  --  8 <5*  CREATININE 1.16 0.85 0.69  --  0.75  --  0.79 0.84  CALCIUM 8.4* 8.5* 8.3*  --  7.8*  --  8.0* 8.3*  MG 2.2  --   --   --   --   --   --   --   PHOS 2.9  --   --   --   --   --   --   --    GFR: Estimated Creatinine Clearance: 96.9 mL/min (by C-G formula based on SCr of 0.84 mg/dL). Liver Function Tests: Recent Labs  Lab  05/02/18 1402  AST 83*  ALT 36  ALKPHOS 67  BILITOT 0.5  PROT 6.1*  ALBUMIN 2.3*   No results for input(s): LIPASE, AMYLASE in the last 168 hours. No results for input(s): AMMONIA in the last 168 hours. Coagulation Profile: No results for input(s): INR, PROTIME in the last 168 hours. Cardiac Enzymes: No results for input(s): CKTOTAL, CKMB, CKMBINDEX, TROPONINI in the last 168 hours. BNP (last 3 results) No results for input(s): PROBNP in the last 8760 hours. HbA1C: No results for input(s): HGBA1C in the last 72 hours. CBG: Recent Labs  Lab 05/03/18 0823  GLUCAP 97   Lipid Profile: No results for input(s): CHOL, HDL, LDLCALC, TRIG, CHOLHDL, LDLDIRECT in the last 72 hours. Thyroid Function Tests: No results for input(s): TSH, T4TOTAL, FREET4, T3FREE, THYROIDAB in the last 72 hours. Anemia Panel: No results for input(s): VITAMINB12, FOLATE, FERRITIN, TIBC, IRON, RETICCTPCT in the last 72 hours. Urine analysis:    Component Value Date/Time   COLORURINE YELLOW  05/06/2018 1609   APPEARANCEUR TURBID (A) 05/06/2018 1609   LABSPEC 1.012 05/06/2018 1609   PHURINE 5.0 05/06/2018 1609   GLUCOSEU NEGATIVE 05/06/2018 1609   HGBUR NEGATIVE 05/06/2018 1609   BILIRUBINUR NEGATIVE 05/06/2018 [place] 05/06/2018 1609   PROTEINUR NEGATIVE 05/06/2018 1609   UROBILINOGEN 0.2 05/08/2012 0314   NITRITE NEGATIVE 05/06/2018 1609   LEUKOCYTESUR NEGATIVE 05/06/2018 1609     Ripudeep Rai M.D. [facility] 05/08/2018, 11:55 AM  Pager: [phone] Between 7am to 7pm - call Pager - [phone]  After 7pm go to [email or web address] - password [email or web address]  Call night coverage person covering after 7pm

## 2018-05-08 NOTE — Progress Notes (Signed)
Subjective: No abdominal pain. Having stool and flatus.  Objective: Vital signs in last 24 hours: Temp:  [98.5 F (36.9 C)-100.9 F (38.3 C)] 98.7 F (37.1 C) (11/20 0924) Pulse Rate:  [91-122] 116 (11/20 1147) Resp:  [18-27] 18 (11/20 0924) BP: (143-161)/(90-114) 161/96 (11/20 1147) SpO2:  [85 %-100 %] 96 % (11/20 0924) Weight change:  Last BM Date: 05/08/18  PE: GEN:  NAD ABD:  Mild-to-moderate distention, no peritonitis  Lab Results: CBC    Component Value Date/Time   WBC 19.0 (H) 05/08/2018 0522   RBC 3.32 (L) 05/08/2018 0522   HGB 9.2 (L) 05/08/2018 0522   HCT 29.3 (L) 05/08/2018 0522   PLT 899 (H) 05/08/2018 0522   MCV 88.3 05/08/2018 0522   MCH 27.7 05/08/2018 0522   MCHC 31.4 05/08/2018 0522   RDW 14.9 05/08/2018 0522   LYMPHSABS 0.7 05/02/2018 1402   MONOABS 0.5 05/02/2018 1402   EOSABS 0.0 05/02/2018 1402   BASOSABS 0.0 05/02/2018 1402   CMP     Component Value Date/Time   NA 137 05/08/2018 0522   K 4.0 05/08/2018 0522   CL 110 05/08/2018 0522   CO2 21 (L) 05/08/2018 0522   GLUCOSE 122 (H) 05/08/2018 0522   BUN <5 (L) 05/08/2018 0522   CREATININE 0.84 05/08/2018 0522   CALCIUM 8.3 (L) 05/08/2018 0522   PROT 6.1 (L) 05/02/2018 1402   ALBUMIN 2.3 (L) 05/02/2018 1402   AST 83 (H) 05/02/2018 1402   ALT 36 05/02/2018 1402   ALKPHOS 67 05/02/2018 1402   BILITOT 0.5 05/02/2018 1402   GFRNONAA >60 05/08/2018 0522   GFRAA >60 05/08/2018 0522    Assessment:  1.  Fevers, MRSA UTI. 2.  Traumatic brain injury with central cord syndrome. 3.  Hypokalemia. 4.  Ileus, likely from infection, superimposed by poor mobility and functional status and hypokalemia.  Much improved. 5.  Malnutrition.  Plan:  1.  Advance diet as tolerated. 2.  Mobilize as possible. 3.  Maintain eukalemia. 4.  No further GI testing planned. 5.  [facility] will sign-off; thank you for the consultation.   Freddy JakschOUTLAW,Dajohn Ellender M 05/08/2018, 3:03 PM   Cell [phone] If no answer  or after 5 PM call [phone]

## 2018-05-08 NOTE — Plan of Care (Signed)
  Problem: Clinical Measurements: Goal: Respiratory complications will improve Outcome: Progressing   Problem: Coping: Goal: Level of anxiety will decrease Outcome: Progressing   

## 2018-05-08 NOTE — Progress Notes (Signed)
   05/08/18 1545  What Happened  Was fall witnessed? Yes  Who witnessed fall? Vernona RiegerLaura, RN  Patients activity before fall ambulating-assisted  Point of contact buttocks  Was patient injured? No  Follow Up  MD notified RAI, MD  Time MD notified [id number]  Family notified Yes-comment  Time family notified 1540  Additional tests No  Progress note created (see row info) Yes  Adult Fall Risk Assessment  Risk Factor Category (scoring not indicated) High fall risk per protocol (document High fall risk)  Patient Fall Risk Level High fall risk  Adult Fall Risk Interventions  Required Bundle Interventions *See Row Information* High fall risk - low, moderate, and high requirements implemented  Additional Interventions Use of appropriate toileting equipment (bedpan, BSC, etc.)  Screening for Fall Injury Risk (To be completed on HIGH fall risk patients) - Assessing Need for Low Bed  Risk For Fall Injury- Low Bed Criteria Admitted as a result of a fall  Will Implement Low Bed and Floor Mats Yes  Screening for Fall Injury Risk (To be completed on HIGH fall risk patients who do not meet crieteria for Low Bed) - Assessing Need for Floor Mats Only  Risk For Fall Injury- Criteria for Floor Mats Bleeding risk-anticoagulation (not prophylaxis)  Will Implement Floor Mats Yes

## 2018-05-09 ENCOUNTER — Inpatient Hospital Stay (HOSPITAL_COMMUNITY): Payer: Medicare Other

## 2018-05-09 DIAGNOSIS — R140 Abdominal distension (gaseous): Secondary | ICD-10-CM

## 2018-05-09 LAB — CBC
HCT: 29.4 % — ABNORMAL LOW (ref 39.0–52.0)
Hemoglobin: 9 g/dL — ABNORMAL LOW (ref 13.0–17.0)
MCH: 27.4 pg (ref 26.0–34.0)
MCHC: 30.6 g/dL (ref 30.0–36.0)
MCV: 89.6 fL (ref 80.0–100.0)
PLATELETS: 849 10*3/uL — AB (ref 150–400)
RBC: 3.28 MIL/uL — AB (ref 4.22–5.81)
RDW: 15.2 % (ref 11.5–15.5)
WBC: 19.2 10*3/uL — ABNORMAL HIGH (ref 4.0–10.5)
nRBC: 0 % (ref 0.0–0.2)

## 2018-05-09 LAB — APTT: aPTT: 44 seconds — ABNORMAL HIGH (ref 24–36)

## 2018-05-09 LAB — BASIC METABOLIC PANEL
ANION GAP: 6 (ref 5–15)
ANION GAP: 7 (ref 5–15)
BUN: 11 mg/dL (ref 8–23)
BUN: 12 mg/dL (ref 8–23)
CALCIUM: 8.6 mg/dL — AB (ref 8.9–10.3)
CALCIUM: 8.4 mg/dL — AB (ref 8.9–10.3)
CO2: 20 mmol/L — ABNORMAL LOW (ref 22–32)
CO2: 18 mmol/L — ABNORMAL LOW (ref 22–32)
Chloride: 109 mmol/L (ref 98–111)
Chloride: 110 mmol/L (ref 98–111)
Creatinine, Ser: 1.74 mg/dL — ABNORMAL HIGH (ref 0.61–1.24)
Creatinine, Ser: 1.94 mg/dL — ABNORMAL HIGH (ref 0.61–1.24)
GFR calc Af Amer: 45 mL/min — ABNORMAL LOW (ref >60)
GFR calc Af Amer: 39 mL/min — ABNORMAL LOW (ref >60)
GFR calc non Af Amer: 34 mL/min — ABNORMAL LOW (ref >60)
GFR, EST NON AFRICAN AMERICAN: 39 mL/min — AB (ref >60)
GLUCOSE: 124 mg/dL — AB (ref 70–99)
GLUCOSE: 112 mg/dL — AB (ref 70–99)
POTASSIUM: 4.9 mmol/L (ref 3.5–5.1)
Potassium: 4.7 mmol/L (ref 3.5–5.1)
SODIUM: 135 mmol/L (ref 135–145)
Sodium: 135 mmol/L (ref 135–145)

## 2018-05-09 MED ORDER — VANCOMYCIN VARIABLE DOSE PER UNSTABLE RENAL FUNCTION (PHARMACIST DOSING): Status: DC

## 2018-05-09 MED ORDER — SENNOSIDES-DOCUSATE SODIUM 8.6-50 MG PO TABS
1.0000 | ORAL_TABLET | Freq: Two times a day (BID) | ORAL | Status: DC
  Administered 2018-05-09 – 2018-05-19 (×19): 1 via ORAL
  Filled 2018-05-09 (×19): qty 1

## 2018-05-09 MED ORDER — KCL IN DEXTROSE-NACL 20-5-0.45 MEQ/L-%-% IV SOLN
INTRAVENOUS | Status: DC
  Administered 2018-05-09: 12:00:00 via INTRAVENOUS
  Filled 2018-05-09: qty 1000

## 2018-05-09 MED ORDER — SORBITOL 70 % SOLN
960.0000 mL | TOPICAL_OIL | Freq: Once | ORAL | Status: AC
  Administered 2018-05-09: 960 mL via RECTAL
  Filled 2018-05-09: qty 473

## 2018-05-09 MED ORDER — SODIUM CHLORIDE 0.9 % IV SOLN
INTRAVENOUS | Status: AC
  Administered 2018-05-09: 23:00:00 via INTRAVENOUS

## 2018-05-09 MED ORDER — SODIUM CHLORIDE 0.9 % IV SOLN: INTRAVENOUS | Status: DC

## 2018-05-09 NOTE — Progress Notes (Signed)
Patient refused the suppository this morning, said he didn't need it that he has been having BM. MD ordered enema and patient refused it too. Educate patient on importance of having the enema but patient refused. Will go back and try in little bite. Will continue to monitor patient.

## 2018-05-09 NOTE — Progress Notes (Signed)
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  He has remained afebrile with stable leukocytosis over the last 24 hours.  His creatinine has continued to increase over the last 48 hours now at 1.94 with concern for vancomycin as potential source for increase.  1. Continue vancomycin for the next 24 hours. Check renal function in the morning and if not improved will change to daptomycin.  2. Will need tunneled central line placed with concern for PICC for renal function as well as concern for potential inadvertent removal.  3. Monitor fever curve and WBC count.    Principal Problem:   Ogilvie's syndrome Active Problems:   Alcohol abuse   AKI (acute kidney injury) (HCC)   Central cord syndrome (HCC)   Essential hypertension   Sepsis (HCC)   Chronic anemia   Hyponatremia   Sacral ulcer (HCC)   History of DVT (deep vein thrombosis)   Traumatic brain injury (HCC)   MRSA bacteremia   Bacteremia   . amLODipine  10 mg Oral Daily  . apixaban  5 mg Oral BID  . bisacodyl  10 mg Rectal Daily  . carvedilol  12.5 mg Oral BID WC  . collagenase   Topical Daily  . liver oil-zinc oxide   Topical QID  . senna-docusate  1 tablet Oral BID  . sorbitol, milk of mag, mineral oil, glycerin (SMOG) enema  960 mL Rectal Once  . vancomycin variable dose per unstable renal function (pharmacist dosing)   Does not apply See admin instructions    SUBJECTIVE:  Afebrile for the last 24 hours and leukocytosis appears stable. No events overnight. Creatinine increased to 1.94. Most recent vancomycin trough of 17.   No Known Allergies   Review of Systems: Review of Systems  Constitutional: Negative for chills and fever.  Respiratory: Negative for cough, shortness of breath and wheezing.     Cardiovascular: Negative for chest pain and leg swelling.  Gastrointestinal: Negative for abdominal pain, constipation, diarrhea, nausea and vomiting.  Skin: Negative for rash.      OBJECTIVE: Vitals:   05/08/18 2012 05/08/18 2020 05/09/18 0440 05/09/18 1108  BP: (!) 143/129  (!) 141/97 (!) 144/94  Pulse: (!) 104  90 90  Resp: 18  18 18   Temp: 98.2 F (36.8 C)  97.9 F (36.6 C) 99.1 F (37.3 C)  TempSrc: Oral  Oral Oral  SpO2: 97%  98% 97%  Weight: 90.4 kg     Height:  5\' 9"  (1.753 m)     Body mass index is 29.43 kg/m.  Physical Exam  Constitutional: He appears well-developed and well-nourished. No distress.  Cardiovascular: Normal rate, regular rhythm, normal heart sounds and intact distal pulses. Exam reveals no gallop and no friction rub.  No murmur heard. Pulmonary/Chest: Effort normal and breath sounds normal. No respiratory distress. He has no wheezes. He has no rales. He exhibits no tenderness.  Abdominal: Soft. Bowel sounds are normal. He exhibits no distension. There is no tenderness.  Neurological: He is alert.  Skin: Skin is warm and dry.    Lab Results Lab Results  Component Value Date   WBC 19.2 (H) 05/09/2018   HGB 9.0 (L) 05/09/2018   HCT 29.4 (L) 05/09/2018   MCV 89.6  05/09/2018   PLT 849 (H) 05/09/2018    Lab Results  Component Value Date   CREATININE 1.94 (H) 05/09/2018   BUN 12 05/09/2018   NA 135 05/09/2018   K 4.9 05/09/2018   CL 110 05/09/2018   CO2 18 (L) 05/09/2018    Lab Results  Component Value Date   ALT 36 05/02/2018   AST 83 (H) 05/02/2018   ALKPHOS 67 05/02/2018   BILITOT 0.5 05/02/2018     Microbiology: Recent Results (from the past 240 hour(s))  Blood Culture (routine x 2)     Status: Abnormal   Collection Time: 05/02/18  1:46 PM  Result Value Ref Range Status   Specimen Description BLOOD RIGHT ANTECUBITAL  Final   Special Requests   Final    BOTTLES DRAWN AEROBIC AND ANAEROBIC Blood Culture adequate volume    Culture  Setup Time   Final    GRAM POSITIVE COCCI IN CLUSTERS IN BOTH AEROBIC AND ANAEROBIC BOTTLES CRITICAL VALUE NOTED.  VALUE IS CONSISTENT WITH PREVIOUSLY REPORTED AND CALLED VALUE. Performed at [facility], [address] [place], [place] [address]    Culture STAPHYLOCOCCUS AUREUS (A)  Final   Report Status 05/05/2018 FINAL  Final  Blood Culture (routine x 2)     Status: Abnormal   Collection Time: 05/02/18  2:03 PM  Result Value Ref Range Status   Specimen Description BLOOD LEFT ANTECUBITAL  Final   Special Requests   Final    BOTTLES DRAWN AEROBIC AND ANAEROBIC Blood Culture adequate volume   Culture  Setup Time   Final    GRAM POSITIVE COCCI IN CLUSTERS IN BOTH AEROBIC AND ANAEROBIC BOTTLES CRITICAL RESULT CALLED TO, READ BACK BY AND VERIFIED WITH: Gay Filler PharmD 10:05 05/03/18 ([email or web address]) Performed at [facility], [address], [place], [place] [address]    Culture METHICILLIN RESISTANT STAPHYLOCOCCUS AUREUS (A)  Final   Report Status 05/05/2018 FINAL  Final   Organism ID, Bacteria METHICILLIN RESISTANT STAPHYLOCOCCUS AUREUS  Final      Susceptibility   Methicillin resistant staphylococcus aureus - MIC*    CIPROFLOXACIN >=8 RESISTANT Resistant     ERYTHROMYCIN >=8 RESISTANT Resistant     GENTAMICIN <=0.5 SENSITIVE Sensitive     OXACILLIN >=4 RESISTANT Resistant     TETRACYCLINE <=1 SENSITIVE Sensitive     VANCOMYCIN 1 SENSITIVE Sensitive     TRIMETH/SULFA >=320 RESISTANT Resistant     CLINDAMYCIN <=0.25 SENSITIVE Sensitive     RIFAMPIN <=0.5 SENSITIVE Sensitive     Inducible Clindamycin NEGATIVE Sensitive     * METHICILLIN RESISTANT STAPHYLOCOCCUS AUREUS  Blood Culture ID Panel (Reflexed)     Status: Abnormal   Collection Time: 05/02/18  2:03 PM  Result Value Ref Range Status   Enterococcus species NOT DETECTED NOT DETECTED Final   Listeria monocytogenes NOT DETECTED NOT DETECTED Final   Staphylococcus species DETECTED (A) NOT DETECTED Final     Comment: CRITICAL RESULT CALLED TO, READ BACK BY AND VERIFIED WITH: Gay Filler PharmD 10:05 05/03/18 ([email or web address])    Staphylococcus aureus (BCID) DETECTED (A) NOT DETECTED Final    Comment: Methicillin (oxacillin)-resistant Staphylococcus aureus (MRSA). MRSA is predictably resistant to beta-lactam antibiotics (except ceftaroline). Preferred therapy is vancomycin unless clinically contraindicated. Patient requires contact precautions if  hospitalized. CRITICAL RESULT CALLED TO, READ BACK BY AND VERIFIED WITH: Gay Filler PharmD 10:05 05/03/18 ([email or web address])    Methicillin resistance DETECTED (A) NOT DETECTED Final    Comment: CRITICAL RESULT CALLED TO,  READ BACK BY AND VERIFIED WITH: Gay Filler PharmD 10:05 05/03/18 ([email or web address])    Streptococcus species NOT DETECTED NOT DETECTED Final   Streptococcus agalactiae NOT DETECTED NOT DETECTED Final   Streptococcus pneumoniae NOT DETECTED NOT DETECTED Final   Streptococcus pyogenes NOT DETECTED NOT DETECTED Final   Acinetobacter baumannii NOT DETECTED NOT DETECTED Final   Enterobacteriaceae species NOT DETECTED NOT DETECTED Final   Enterobacter cloacae complex NOT DETECTED NOT DETECTED Final   Escherichia coli NOT DETECTED NOT DETECTED Final   Klebsiella oxytoca NOT DETECTED NOT DETECTED Final   Klebsiella pneumoniae NOT DETECTED NOT DETECTED Final   Proteus species NOT DETECTED NOT DETECTED Final   Serratia marcescens NOT DETECTED NOT DETECTED Final   Haemophilus influenzae NOT DETECTED NOT DETECTED Final   Neisseria meningitidis NOT DETECTED NOT DETECTED Final   Pseudomonas aeruginosa NOT DETECTED NOT DETECTED Final   Candida albicans NOT DETECTED NOT DETECTED Final   Candida glabrata NOT DETECTED NOT DETECTED Final   Candida krusei NOT DETECTED NOT DETECTED Final   Candida parapsilosis NOT DETECTED NOT DETECTED Final   Candida tropicalis NOT DETECTED NOT DETECTED Final    Comment: Performed at [facility], [address], [place], [place] [address]    MRSA PCR Screening     Status: Abnormal   Collection Time: 05/03/18  6:12 AM  Result Value Ref Range Status   MRSA by PCR POSITIVE (A) NEGATIVE Final    Comment:        The GeneXpert MRSA Assay (FDA approved for NASAL specimens only), is one component of a comprehensive MRSA colonization surveillance program. It is not intended to diagnose MRSA infection nor to guide or monitor treatment for MRSA infections. RESULT CALLED TO, READ BACK BY AND VERIFIED WITH: Lyla Glassing RN 9:40 05/03/18 ([email or web address]) Performed at [facility], [address], [place], [place] [address]   Culture, blood (routine x 2)     Status: None   Collection Time: 05/03/18 10:01 PM  Result Value Ref Range Status   Specimen Description BLOOD RIGHT HAND  Final   Special Requests   Final    BOTTLES DRAWN AEROBIC AND ANAEROBIC Blood Culture adequate volume   Culture   Final    NO GROWTH 5 DAYS Performed at [facility], [address] [place], [place] [address]    Report Status 05/08/2018 FINAL  Final  Culture, blood (routine x 2)     Status: None   Collection Time: 05/03/18 10:09 PM  Result Value Ref Range Status   Specimen Description BLOOD LEFT HAND  Final   Special Requests   Final    BOTTLES DRAWN AEROBIC ONLY Blood Culture adequate volume   Culture   Final    NO GROWTH 5 DAYS Performed at [facility], [address] [place], [place] [address]    Report Status 05/08/2018 FINAL  Final     Marcos Eke, NP [facility] [phone] Pager  05/09/2018  2:15 PM

## 2018-05-09 NOTE — Progress Notes (Signed)
Pharmacy Antibiotic Note  Wayne Buckley is a [age] y.o. male on day # 7 Vancomycin for MRSA bacteremia with unclear source.  Planning minimun 4 weeks of therapy per ID. Scr has increased significantly o/n from 0.84>>1.74>>1.94. Will empirically hold schedueld vanc and get random in AM on 11/22  Plan: Change Vancomycin dosing to as needed.  Vancomycin random in AM 11/22 (~24 hour level)   Height: 5\' 9"  (175.3 cm) Weight: 199 lb 4.7 oz (90.4 kg) IBW/kg (Calculated) : 70.7  Temp (24hrs), Avg:98.4 F (36.9 C), Min:97.9 F (36.6 C), Max:99.1 F (37.3 C)  Recent Labs  Lab 05/02/18 1412 05/02/18 1644  05/05/18 0358  05/05/18 1528 05/06/18 0538 05/07/18 0403 05/08/18 0522 05/08/18 1423 05/09/18 0644 05/09/18 1041  WBC  --   --    < > 12.9*  --   --  12.5* 15.2* 19.0*  --  19.2*  --   CREATININE  --   --    < >  --    < >  --  0.75 0.79 0.84  --  1.74* 1.94*  LATICACIDVEN 1.13 1.66  --   --   --   --   --   --   --   --   --   --   VANCOTROUGH  --   --   --   --   --  16  --   --   --  17  --   --    < > = values in this interval not displayed.    Estimated Creatinine Clearance: 40.5 mL/min (A) (by C-G formula based on SCr of 1.94 mg/dL (H)).    No Known Allergies  Antimicrobials this admission:  Vancomycin 11/14  >>   Cefepime 11/14 >> 11/15   Dose adjustments this admission:   11/17 VT 16 mcg/ml (30 min late) - on Vanc 750 mg IV q12h > no change   11/20 VT 17 mcg/ml - on Vanc 750 mg IV q12h > no change  Culture data:   11/14 Blood x 2 -  MRSA  11/15 MRSA PCR  positive, CHG/Bactroban 11/16>>11/20 11/15 Blood x 2 - negative  Wayne Buckley, PharmD, BCPS Clinical Pharmacist [place] Pager: [phone] Please utilize Amion for appropriate phone number to reach the unit pharmacist [facility])   05/09/2018 12:19 PM

## 2018-05-09 NOTE — Progress Notes (Signed)
Patient had a SMOG enema with goo result. Patient tolerated enema well. Will continue to monitor patient.

## 2018-05-09 NOTE — Progress Notes (Signed)
[facility]                                                                              Patient Demographics  Wayne Buckley, is a 67 y.o. male, DOB - 1950/10/14, HMC:[id number]  Admit date - 05/02/2018   Admitting Physician Shela Leff, MD  Outpatient Primary MD for the patient is [name], MD  Outpatient specialists:   LOS - 7  days   Medical records reviewed and are as summarized below:    Chief Complaint  Patient presents with  . Fever       Brief summary    Wayne Buckley is a 67 y.o. malewith medical history significant oftraumatic brain injury, central cord syndrome, neurogenic bladder with indwelling Foley catheter, bilateral leg weakness, prior DVT, hypertension, alcohol abuse. He presented secondary to fever. He was found to have MRSA in his urine prior to arrival. Blood cultures positive for MRSA and CT abdomen concerning for ileus.  Assessment & Plan    Principal Problem: Sepsis -Patient met criteria for sepsis at the time of admission, source is secondary to UTI and MRSA bacteremia -Sepsis physiology now improved  Active problems MRSA bacteremia, MRSA UTI -Patient empirically treated with vancomycin and cefepime. -Repeat blood cultures 11/15 showed no growth -2D echo showed no evidence of vegetations -ID following, recommended IV vancomycin.  TEE was unsuccessful due to ?obstruction, esophagogram showed no stricture  Acute kidney injury -Creatinine now trending up possibly due to dehydration, patient has not been eating well  -Placed on IV fluid hydration,   Ileus/abdominal distention -Patient has been followed by general surgery and gastroenterology, differentials including Ogilvie's syndrome, mechanical component -Abdomen distended, obtained abdominal x-ray which showed dilated loops of colon, improving from the previous x-rays.  Esophagogram results discussed with GI, Dr. [name].  Per GI, patient will not be able to  tolerate EGD with multiple comorbidities currently and no obvious stricture on the esophagram. -Regarding ileus, recommended enemas, advance diet, increase mobility, sit upright.  Patient is not a candidate for neostigmine or colonoscopy decompression.  Per GI, this is commonly seen in patients with TBI and cord compression and will spontaneously resolve.  Continue to correct electrolytes. -Placed on full liquid diet and advance as tolerated, smog enema today, placed on Senokot-S, Dulcolax suppositories  Urinary retention with neurogenic bladder -Per admission history patient with indwelling Foley catheter secondary to neurogenic bladder, TBI and cord compression - follow outpatient with urology and outpatient voiding trial, patient was supposed to have appointment today on 11/20  Anemia, likely chronic H&H stable   Leukocytosis Likely due to #1 and #2, follow closely, blood cultures been negative so far, afebrile  History of DVT Continue Eliquis  History of TBI -Reviewed recommendations from inpatient rehab, recommended skilled nursing facility discussed with patient's wife  Essential hypertension BP stable, continue amlodipine and Coreg  History of alcohol use Currently stable, no acute issues  Pressure injury POA, bilateral buttocks, deep tissue injury Wound care per nursing  Code Status: Full CODE STATUS DVT Prophylaxis: Apixaban Family Communication: Discussed in detail with the patient, all imaging results, lab results explained to the patient  wife   Disposition Plan:   Time Spent in minutes 35 minutes  Procedures:  2D echo EF 60 to 65%, no vegetations  Consultants:   GI Infectious disease General surgery  Antimicrobials:      Medications  Scheduled Meds: . amLODipine  10 mg Oral Daily  . apixaban  5 mg Oral BID  . bisacodyl  10 mg Rectal Daily  . carvedilol  12.5 mg Oral BID WC  . collagenase   Topical Daily  . liver oil-zinc oxide   Topical QID    . senna-docusate  1 tablet Oral BID  . sorbitol, milk of mag, mineral oil, glycerin (SMOG) enema  960 mL Rectal Once  . vancomycin variable dose per unstable renal function (pharmacist dosing)   Does not apply See admin instructions   Continuous Infusions: . dextrose 5 % and 0.45 % NaCl with KCl 20 mEq/L 100 mL/hr at 05/09/18 1147   PRN Meds:.acetaminophen, hydrALAZINE   Antibiotics   Anti-infectives (From admission, onward)   Start     Dose/Rate Route Frequency Ordered Stop   05/09/18 1205  vancomycin variable dose per unstable renal function (pharmacist dosing)      Does not apply See admin instructions 05/09/18 1205     05/03/18 1400  ceFEPIme (MAXIPIME) 2 g in sodium chloride 0.9 % 100 mL IVPB  Status:  Discontinued     2 g 200 mL/hr over 30 Minutes Intravenous Every 24 hours 05/02/18 1526 05/03/18 1026   05/03/18 0330  vancomycin (VANCOCIN) IVPB 750 mg/150 ml premix  Status:  Discontinued     750 mg 150 mL/hr over 60 Minutes Intravenous Every 12 hours 05/02/18 1526 05/09/18 1205   05/02/18 1500  vancomycin (VANCOCIN) IVPB 1000 mg/200 mL premix     1,000 mg 200 mL/hr over 60 Minutes Intravenous NOW 05/02/18 1448 05/02/18 1620   05/02/18 1400  vancomycin (VANCOCIN) IVPB 1000 mg/200 mL premix     1,000 mg 200 mL/hr over 60 Minutes Intravenous  Once 05/02/18 1354 05/02/18 1520   05/02/18 1400  ceFEPIme (MAXIPIME) 2 g in sodium chloride 0.9 % 100 mL IVPB     2 g 200 mL/hr over 30 Minutes Intravenous  Once 05/02/18 1354 05/02/18 1445        Subjective:   Wayne Buckley was seen and examined today.  Afebrile, abdominal distension+. No BM since yesterday. Patient denies dizziness, chest pain, shortness of breath, new weakness  Objective:   Vitals:   05/08/18 2012 05/08/18 2020 05/09/18 0440 05/09/18 1108  BP: (!) 143/129  (!) 141/97 (!) 144/94  Pulse: (!) 104  90 90  Resp: _0 Temp: 98.2 F (36.8 C)  97.9 F (36.6 C) 99.1 F (37.3 C)  TempSrc: Oral  Oral Oral   SpO2: 97%  98% 97%  Weight: 90.4 kg     Height:  _1  (1.753 m)      Intake/Output Summary (Last 24 hours) at 05/09/2018 1238 Last data filed at 05/09/2018 0900 Gross per 24 hour  Intake 0 ml  Output 550 ml  Net -550 ml     Wt Readings from Last 3 Encounters:  05/08/18 90.4 kg  04/03/18 101.3 kg  03/02/18 99.8 kg    Physical Exam  General: Alert, dysarthria    Eyes:   HEENT:    Cardiovascular: S1 S2 auscultated,  Regular rate and rhythm.  Respiratory: CTAB  Gastrointestinal: Soft, nontender, ++ndistended, hypoactive BS  Ext: no pedal edema bilaterally  Neuro:  Musculoskeletal: No digital cyanosis, clubbing  Skin: No rashes  Psych: Normal affect and demeanor    Data Reviewed:  I have personally reviewed following labs and imaging studies  Micro Results Recent Results (from the past 240 hour(s))  Blood Culture (routine x 2)     Status: Abnormal   Collection Time: 05/02/18  1:46 PM  Result Value Ref Range Status   Specimen Description BLOOD RIGHT ANTECUBITAL  Final   Special Requests   Final    BOTTLES DRAWN AEROBIC AND ANAEROBIC Blood Culture adequate volume   Culture  Setup Time   Final    GRAM POSITIVE COCCI IN CLUSTERS IN BOTH AEROBIC AND ANAEROBIC BOTTLES CRITICAL VALUE NOTED.  VALUE IS CONSISTENT WITH PREVIOUSLY REPORTED AND CALLED VALUE. Performed at [facility], [place] [address] [place], [place] [address]    Culture STAPHYLOCOCCUS AUREUS (A)  Final   Report Status 05/05/2018 FINAL  Final  Blood Culture (routine x 2)     Status: Abnormal   Collection Time: 05/02/18  2:03 PM  Result Value Ref Range Status   Specimen Description BLOOD LEFT ANTECUBITAL  Final   Special Requests   Final    BOTTLES DRAWN AEROBIC AND ANAEROBIC Blood Culture adequate volume   Culture  Setup Time   Final    GRAM POSITIVE COCCI IN CLUSTERS IN BOTH AEROBIC AND ANAEROBIC BOTTLES CRITICAL RESULT CALLED TO, READ BACK BY AND VERIFIED WITH: Jene Every PharmD 10:05  05/03/18 ([email or web address]) Performed at [facility], [place] [address], [place], [place] [address]    Culture METHICILLIN RESISTANT STAPHYLOCOCCUS AUREUS (A)  Final   Report Status 05/05/2018 FINAL  Final   Organism ID, Bacteria METHICILLIN RESISTANT STAPHYLOCOCCUS AUREUS  Final      Susceptibility   Methicillin resistant staphylococcus aureus - MIC*    CIPROFLOXACIN >=8 RESISTANT Resistant     ERYTHROMYCIN >=8 RESISTANT Resistant     GENTAMICIN <=0.5 SENSITIVE Sensitive     OXACILLIN >=4 RESISTANT Resistant     TETRACYCLINE <=1 SENSITIVE Sensitive     VANCOMYCIN 1 SENSITIVE Sensitive     TRIMETH/SULFA >=320 RESISTANT Resistant     CLINDAMYCIN <=0.25 SENSITIVE Sensitive     RIFAMPIN <=0.5 SENSITIVE Sensitive     Inducible Clindamycin NEGATIVE Sensitive     * METHICILLIN RESISTANT STAPHYLOCOCCUS AUREUS  Blood Culture ID Panel (Reflexed)     Status: Abnormal   Collection Time: 05/02/18  2:03 PM  Result Value Ref Range Status   Enterococcus species NOT DETECTED NOT DETECTED Final   Listeria monocytogenes NOT DETECTED NOT DETECTED Final   Staphylococcus species DETECTED (A) NOT DETECTED Final    Comment: CRITICAL RESULT CALLED TO, READ BACK BY AND VERIFIED WITH: Jene Every PharmD 10:05 05/03/18 ([email or web address])    Staphylococcus aureus (BCID) DETECTED (A) NOT DETECTED Final    Comment: Methicillin (oxacillin)-resistant Staphylococcus aureus (MRSA). MRSA is predictably resistant to beta-lactam antibiotics (except ceftaroline). Preferred therapy is vancomycin unless clinically contraindicated. Patient requires contact precautions if  hospitalized. CRITICAL RESULT CALLED TO, READ BACK BY AND VERIFIED WITH: Jene Every PharmD 10:05 05/03/18 ([email or web address])    Methicillin resistance DETECTED (A) NOT DETECTED Final    Comment: CRITICAL RESULT CALLED TO, READ BACK BY AND VERIFIED WITH: Jene Every PharmD 10:05 05/03/18 ([email or web address])    Streptococcus species NOT DETECTED NOT DETECTED Final   Streptococcus agalactiae NOT  DETECTED NOT DETECTED Final   Streptococcus pneumoniae NOT DETECTED NOT DETECTED Final   Streptococcus pyogenes NOT DETECTED NOT DETECTED Final  Acinetobacter baumannii NOT DETECTED NOT DETECTED Final   Enterobacteriaceae species NOT DETECTED NOT DETECTED Final   Enterobacter cloacae complex NOT DETECTED NOT DETECTED Final   Escherichia coli NOT DETECTED NOT DETECTED Final   Klebsiella oxytoca NOT DETECTED NOT DETECTED Final   Klebsiella pneumoniae NOT DETECTED NOT DETECTED Final   Proteus species NOT DETECTED NOT DETECTED Final   Serratia marcescens NOT DETECTED NOT DETECTED Final   Haemophilus influenzae NOT DETECTED NOT DETECTED Final   Neisseria meningitidis NOT DETECTED NOT DETECTED Final   Pseudomonas aeruginosa NOT DETECTED NOT DETECTED Final   Candida albicans NOT DETECTED NOT DETECTED Final   Candida glabrata NOT DETECTED NOT DETECTED Final   Candida krusei NOT DETECTED NOT DETECTED Final   Candida parapsilosis NOT DETECTED NOT DETECTED Final   Candida tropicalis NOT DETECTED NOT DETECTED Final    Comment: Performed at [facility], [place] [address], [place], [place] [address]  MRSA PCR Screening     Status: Abnormal   Collection Time: 05/03/18  6:12 AM  Result Value Ref Range Status   MRSA by PCR POSITIVE (A) NEGATIVE Final    Comment:        The GeneXpert MRSA Assay (FDA approved for NASAL specimens only), is one component of a comprehensive MRSA colonization surveillance program. It is not intended to diagnose MRSA infection nor to guide or monitor treatment for MRSA infections. RESULT CALLED TO, READ BACK BY AND VERIFIED WITH: Precious Bard RN 9:40 05/03/18 ([email or web address]) Performed at [facility], [place] [address] [place], [place] [address]   Culture, blood (routine x 2)     Status: None   Collection Time: 05/03/18 10:01 PM  Result Value Ref Range Status   Specimen Description BLOOD RIGHT HAND  Final   Special Requests   Final    BOTTLES DRAWN AEROBIC AND  ANAEROBIC Blood Culture adequate volume   Culture   Final    NO GROWTH 5 DAYS Performed at [facility], [place] [address] [place], [place] [address]    Report Status 05/08/2018 FINAL  Final  Culture, blood (routine x 2)     Status: None   Collection Time: 05/03/18 10:09 PM  Result Value Ref Range Status   Specimen Description BLOOD LEFT HAND  Final   Special Requests   Final    BOTTLES DRAWN AEROBIC ONLY Blood Culture adequate volume   Culture   Final    NO GROWTH 5 DAYS Performed at [facility], [place] [address], [place],  [address]    Report Status 05/08/2018 FINAL  Final    Radiology Reports Ct Abdomen Pelvis Wo Contrast  Result Date: 05/02/2018 CLINICAL DATA:  Abdominal distention EXAM: CT ABDOMEN AND PELVIS WITHOUT CONTRAST TECHNIQUE: Multidetector CT imaging of the abdomen and pelvis was performed following the standard protocol without IV contrast. COMPARISON:  Same day radiographs of the abdomen and pelvis FINDINGS: Lower chest: Top-normal heart size. Trace pleural effusions with bibasilar atelectasis. Hepatobiliary: No focal liver abnormality is seen. No gallstones, gallbladder wall thickening, or biliary dilatation. Pancreas: Unremarkable. No pancreatic ductal dilatation or surrounding inflammatory changes. Spleen: Normal in size without focal abnormality. Adrenals/Urinary Tract: Normal bilateral adrenal glands. No nephrolithiasis. Motion artifacts limit assessment at the level of the kidneys however there does appear to be a hypodense cystic lesion of the interpolar left kidney measuring up to 6 cm. No nephrolithiasis nor hydroureteronephrosis. Foley decompressed urinary bladder. Stomach/Bowel: Small hiatal hernia. Decompressed stomach and small bowel loops.  Normal small bowel rotation. Moderate gaseous distention of the colon up to 9 cm at the level of the cecum with liquid stool noted within from cecum to distal descending colon likely representing large bowel  dysmotility or ileus. Circumferential thickening surrounding a small stool ball is noted within the rectum. Mild stercoral proctitis is not excluded. Vascular/Lymphatic: IVC filter in place. Atherosclerosis of the aorta without aneurysm. No adenopathy. Reproductive: Normal size prostate. Other: Soft tissue anasarca. No free air or free fluid. Musculoskeletal: Status post ventral hernia repair. Small amount of fat in the right inguinal canal. IMPRESSION: 1. Moderate gaseous distention of the colon up to 9 cm at the level of the cecum with liquid stool noted within from cecum to distal descending colon. Findings may represent a large bowel dysmotility or ileus. Mechanical source of obstruction is identified. 2. Circumferential thickening surrounding a small stool ball within the rectum. Mild stercoral proctitis could have this appearance. 3. 6 cm hypodense cystic lesion of the interpolar left kidney, likely a simple cyst however further characterization is limited due to motion. 4. IVC filter in place. 5. Mild soft tissue anasarca. Electronically Signed   By: Ashley Royalty M.D.   On: 05/02/2018 16:46   Dg Abdomen 1 View  Result Date: 05/02/2018 CLINICAL DATA:  67 y/o  M; abdominal distention. EXAM: ABDOMEN - 1 VIEW COMPARISON:  05/02/2018 CT abdomen and pelvis. FINDINGS: Diffuse air-filled distention of the large bowel. Upper abdomen is excluded from the field of view. IVC filter in situ. Advanced degenerative changes of the visible lumbar spine. Lower abdominal wall mesh hernia repair anchors noted. IMPRESSION: Diffuse air-filled distention of the large bowel, possibly chronic large bowel dysmotility or ileus. Electronically Signed   By: Kristine Garbe M.D.   On: 05/02/2018 20:39   Dg Esophagus  Result Date: 05/09/2018 CLINICAL DATA:  Dysphagia EXAM: ESOPHOGRAM/BARIUM SWALLOW TECHNIQUE: Single contrast examination was performed using  thin barium. FLUOROSCOPY TIME:  Fluoroscopy Time:  2 minutes 24  seconds Radiation Exposure Index (if provided by the fluoroscopic device): Number of Acquired Spot Images: 0 COMPARISON:  None. FINDINGS: Limited study due to patient's condition. There are no fixed strictures visualized. No reflux noted during the study. The patient had difficulty swallowing the 13 mm barium tablet which remains present in the back of the throat for several swallows, finally passing into the esophagus. This sticks in the mid to distal esophagus despite the lack of visible stricture in this area. IMPRESSION: No visible stricture seen, but the 13 mm barium tablet sticks in the mid to distal esophagus. May consider further evaluation with endoscopy. Electronically Signed   By: Rolm Baptise M.D.   On: 05/09/2018 10:37   Dg Chest Port 1 View  Result Date: 05/02/2018 CLINICAL DATA:  Fever EXAM: PORTABLE CHEST 1 VIEW COMPARISON:  03/03/2018 FINDINGS: The heart size and mediastinal contours are within normal limits. Both lungs are clear. The visualized skeletal structures are unremarkable. IMPRESSION: No active disease. Electronically Signed   By: Franchot Gallo M.D.   On: 05/02/2018 14:35   Dg Abd 2 Views  Result Date: 05/09/2018 CLINICAL DATA:  Abdominal distention. EXAM: ABDOMEN - 2 VIEW COMPARISON:  05/04/2018, 05/02/2018.  CT 05/02/2018. FINDINGS: IVC filter noted tilted over the upper portion of the inferior vena cava in unchanged position. Distended loops of colon again noted. Colon is slightly less distended on today's exam. Several nonspecific air-filled loops of small bowel noted. No free air is identified. Degenerative changes lumbar spine. IMPRESSION: Dilated loops  of colon again noted. Colon is slightly less distended on today's exam. Electronically Signed   By: Marcello Moores  Register   On: 05/09/2018 09:58   Dg Abd Portable 1v  Result Date: 05/04/2018 CLINICAL DATA:  Follow-up ileus EXAM: PORTABLE ABDOMEN - 1 VIEW COMPARISON:  05/02/2018 FINDINGS: Distended colon is again noted and  stable consistent with generalized ileus. No definitive obstructive changes are seen. Postsurgical changes in the low pelvis are noted. Degenerative changes of the lumbar spine are seen. No free air is noted. IMPRESSION: Stable gaseous dilatation of the colon consistent with ileus. Electronically Signed   By: Inez Catalina M.D.   On: 05/04/2018 09:40    Lab Data:  CBC: Recent Labs  Lab 05/02/18 1402  05/05/18 0358 05/06/18 0538 05/07/18 0403 05/08/18 0522 05/09/18 0644  WBC 7.8   < > 12.9* 12.5* 15.2* 19.0* 19.2*  NEUTROABS 6.5  --   --   --   --   --   --   HGB 9.7*   < > 8.7* 8.4* 8.4* 9.2* 9.0*  HCT 30.2*   < > 28.0* 26.4* 26.7* 29.3* 29.4*  MCV 88.6   < > 88.3 88.0 89.0 88.3 89.6  PLT 253   < > 468* 580* 755* 899* 849*   < > = values in this interval not displayed.   Basic Metabolic Panel: Recent Labs  Lab 05/03/18 0533  05/06/18 0538 05/06/18 1616 05/07/18 0403 05/08/18 0522 05/09/18 0644 05/09/18 1041  NA 134*   < > 138  --  138 137 135 135  K 3.3*   < > 3.8 3.9 3.8 4.0 4.7 4.9  CL 104   < > 114*  --  112* 110 109 110  CO2 21*   < > 18*  --  21* 21* 20* 18*  GLUCOSE 110*   < > 107*  --  107* 122* 124* 112*  BUN 68*   < > 12  --  8 <5* 11 12  CREATININE 1.16   < > 0.75  --  0.79 0.84 1.74* 1.94*  CALCIUM 8.4*   < > 7.8*  --  8.0* 8.3* 8.6* 8.4*  MG 2.2  --   --   --   --   --   --   --   PHOS 2.9  --   --   --   --   --   --   --    < > = values in this interval not displayed.   GFR: Estimated Creatinine Clearance: 40.5 mL/min (A) (by C-G formula based on SCr of 1.94 mg/dL (H)). Liver Function Tests: Recent Labs  Lab 05/02/18 1402  AST 83*  ALT 36  ALKPHOS 67  BILITOT 0.5  PROT 6.1*  ALBUMIN 2.3*   No results for input(s): LIPASE, AMYLASE in the last 168 hours. No results for input(s): AMMONIA in the last 168 hours. Coagulation Profile: No results for input(s): INR, PROTIME in the last 168 hours. Cardiac Enzymes: No results for input(s): CKTOTAL,  CKMB, CKMBINDEX, TROPONINI in the last 168 hours. BNP (last 3 results) No results for input(s): PROBNP in the last 8760 hours. HbA1C: No results for input(s): HGBA1C in the last 72 hours. CBG: Recent Labs  Lab 05/03/18 0823  GLUCAP 97   Lipid Profile: No results for input(s): CHOL, HDL, LDLCALC, TRIG, CHOLHDL, LDLDIRECT in the last 72 hours. Thyroid Function Tests: No results for input(s): TSH, T4TOTAL, FREET4, T3FREE, THYROIDAB in the last 72 hours. Anemia  Panel: No results for input(s): VITAMINB12, FOLATE, FERRITIN, TIBC, IRON, RETICCTPCT in the last 72 hours. Urine analysis:    Component Value Date/Time   COLORURINE YELLOW 05/06/2018 1609   APPEARANCEUR TURBID (A) 05/06/2018 1609   LABSPEC 1.012 05/06/2018 1609   PHURINE 5.0 05/06/2018 1609   GLUCOSEU NEGATIVE 05/06/2018 1609   HGBUR NEGATIVE 05/06/2018 1609   BILIRUBINUR NEGATIVE 05/06/2018 [place] 05/06/2018 1609   PROTEINUR NEGATIVE 05/06/2018 1609   UROBILINOGEN 0.2 05/08/2012 0314   NITRITE NEGATIVE 05/06/2018 1609   LEUKOCYTESUR NEGATIVE 05/06/2018 1609       M.D. [facility] 05/09/2018, 12:38 PM  Pager: [phone] Between 7am to 7pm - call Pager - [phone]  After 7pm go to [email or web address] - password [email or web address]  Call night coverage person covering after 7pm

## 2018-05-10 LAB — CBC
HCT: 28.2 % — ABNORMAL LOW (ref 39.0–52.0)
Hemoglobin: 8.6 g/dL — ABNORMAL LOW (ref 13.0–17.0)
MCH: 27.2 pg (ref 26.0–34.0)
MCHC: 30.5 g/dL (ref 30.0–36.0)
MCV: 89.2 fL (ref 80.0–100.0)
NRBC: 0 % (ref 0.0–0.2)
PLATELETS: 915 10*3/uL — AB (ref 150–400)
RBC: 3.16 MIL/uL — ABNORMAL LOW (ref 4.22–5.81)
RDW: 15.3 % (ref 11.5–15.5)
WBC: 23.1 10*3/uL — ABNORMAL HIGH (ref 4.0–10.5)

## 2018-05-10 LAB — MAGNESIUM: Magnesium: 1.9 mg/dL (ref 1.7–2.4)

## 2018-05-10 LAB — BASIC METABOLIC PANEL
Anion gap: 7 (ref 5–15)
BUN: 19 mg/dL (ref 8–23)
CO2: 17 mmol/L — AB (ref 22–32)
CREATININE: 2.75 mg/dL — AB (ref 0.61–1.24)
Calcium: 8.5 mg/dL — ABNORMAL LOW (ref 8.9–10.3)
Chloride: 109 mmol/L (ref 98–111)
GFR calc non Af Amer: 22 mL/min — ABNORMAL LOW (ref >60)
GFR, EST AFRICAN AMERICAN: 26 mL/min — AB (ref >60)
GLUCOSE: 103 mg/dL — AB (ref 70–99)
Potassium: 4.7 mmol/L (ref 3.5–5.1)
Sodium: 133 mmol/L — ABNORMAL LOW (ref 135–145)

## 2018-05-10 LAB — VANCOMYCIN, RANDOM: Vancomycin Rm: 25

## 2018-05-10 LAB — APTT: aPTT: 54 seconds — ABNORMAL HIGH (ref 24–36)

## 2018-05-10 MED ORDER — SODIUM CHLORIDE 0.9 % IV SOLN
730.0000 mg | INTRAVENOUS | Status: DC
  Administered 2018-05-10 – 2018-05-15 (×3): 730 mg via INTRAVENOUS
  Filled 2018-05-10 (×3): qty 14.6

## 2018-05-10 MED ORDER — SODIUM BICARBONATE 8.4 % IV SOLN: INTRAVENOUS | Status: DC

## 2018-05-10 MED ORDER — POLYETHYLENE GLYCOL 3350 17 G PO PACK
17.0000 g | PACK | Freq: Every day | ORAL | Status: DC
  Administered 2018-05-10 – 2018-05-11 (×2): 17 g via ORAL
  Filled 2018-05-10 (×2): qty 1

## 2018-05-10 MED ORDER — SODIUM BICARBONATE 8.4 % IV SOLN
INTRAVENOUS | Status: DC
  Administered 2018-05-10 – 2018-05-12 (×6): via INTRAVENOUS
  Filled 2018-05-10 (×8): qty 150

## 2018-05-10 MED ORDER — SODIUM CHLORIDE 0.9 % IV SOLN: INTRAVENOUS | Status: DC

## 2018-05-10 MED ORDER — SODIUM BICARBONATE 8.4 % IV SOLN
INTRAVENOUS | Status: DC
  Filled 2018-05-10: qty 1000

## 2018-05-10 NOTE — Progress Notes (Signed)
Pharmacy Antibiotic Note  Wayne Buckley is a 67 y.o. male admitted on 05/02/2018 with MRSA bacteremia.   Pharmacy has been consulted for Daptomycin dosing. Blood cultures from 11/15 are NGTD. TEE was unable to be performed due to an esophageal stricture. WBC up to 23.1 and Tmax-100. Patient is currently suffering an AKI with SCr up to 2.75 from 0.84 just 2 days ago. Vancomycin random level was 25 this AM so will start Daptomycin this evening.   Plan: Daptomycin 730 mg (~ 8 mg/kg) every 48 hours Weekly CK on Saturdays Will likely need 6 weeks of therapy   Height: 5\' 9"  (175.3 cm) Weight: 200 lb 2.8 oz (90.8 kg) IBW/kg (Calculated) : 70.7  Temp (24hrs), Avg:98.9 F (37.2 C), Min:98.2 F (36.8 C), Max:100 F (37.8 C)  Recent Labs  Lab 05/05/18 1528 05/06/18 0538 05/07/18 0403 05/08/18 0522 05/08/18 1423 05/09/18 0644 05/09/18 1041 05/10/18 0642 05/10/18 0943  WBC  --  12.5* 15.2* 19.0*  --  19.2*  --  23.1*  --   CREATININE  --  0.75 0.79 0.84  --  1.74* 1.94* 2.75*  --   VANCOTROUGH 16  --   --   --  17  --   --   --   --   VANCORANDOM  --   --   --   --   --   --   --   --  25    Estimated Creatinine Clearance: 28.6 mL/min (A) (by C-G formula based on SCr of 2.75 mg/dL (H)).    No Known Allergies  Antimicrobials this admission: Cefepime 11/14>>11/15 Vanc 11/15>>11/22 Dapto 11/22>>    Thank you for allowing pharmacy to be a part of this patient's care.  Rolanda JayEmily S Sinclair,PharmD, BCPS  Infectious Diseases Clinical Pharmacist Phone: [phone] 05/10/2018 2:09 PM

## 2018-05-10 NOTE — Progress Notes (Signed)
Patient platelets count is 915. MD notified.

## 2018-05-10 NOTE — Progress Notes (Signed)
         [facility]                                                                              Patient Demographics  Wayne Buckley, is a 67 y.o. male, DOB - 09/16/1950, MRN:[id number]  Admit date - 05/02/2018   Admitting Physician Vasundhra Rathore, MD  Outpatient Primary MD for the patient is Ross, Charles Alan, MD  Outpatient specialists:   LOS - 8  days   Medical records reviewed and are as summarized below:    Chief Complaint  Patient presents with  . Fever       Brief summary    Wayne Buckley is a 67 y.o. malewith medical history significant oftraumatic brain injury, central cord syndrome, neurogenic bladder with indwelling Foley catheter, bilateral leg weakness, prior DVT, hypertension, alcohol abuse. He presented secondary to fever. He was found to have MRSA in his urine prior to arrival. Blood cultures positive for MRSA and CT abdomen concerning for ileus.  Assessment & Plan    Principal Problem: Sepsis -Patient met criteria for sepsis at the time of admission, source is secondary to UTI and MRSA bacteremia  Active problems MRSA bacteremia, MRSA UTI -Patient empirically treated with vancomycin and cefepime. -Repeat blood cultures 11/15 showed no growth, leukocytosis still trending up -2D echo showed no evidence of vegetations -ID following, initially was placed on IV vancomycin, now changed to daptomycin.   -  TEE was unsuccessful due to ?obstruction, esophagogram showed no stricture  Acute kidney injury with metabolic acidosis -Vancomycin discontinued, placed on IV fluids with bicarb     Ileus/abdominal distention -Patient has been followed by general surgery and gastroenterology, differentials including Ogilvie's syndrome, mechanical component -Abdomen distended, obtained abdominal x-ray which showed dilated loops of colon, improving from the previous x-rays.  Esophagogram results discussed with GI, Dr. Outlaw.  Per GI, patient will not be able to  tolerate EGD with multiple comorbidities currently and no obvious stricture on the esophagram. -Regarding ileus, recommended enemas, advance diet, increase mobility, sit upright.  Patient is not a candidate for neostigmine or colonoscopy decompression.  Per GI, this is commonly seen in patients with TBI and cord compression and will spontaneously resolve. -Continue full liquid diet, placed on bowel regimen, felt better after smog enema on 11/21, large BM  Urinary retention with neurogenic bladder -Per admission history patient with indwelling Foley catheter secondary to neurogenic bladder, TBI and cord compression - follow outpatient with urology and outpatient voiding trial, patient was supposed to have appointment today on 11/20  Anemia, likely chronic H&H stable   Leukocytosis Worsening, likely due to #1 and #2, follow closely, blood cultures been negative so far, afebrile  History of DVT Continue Eliquis  History of TBI -Reviewed recommendations from inpatient rehab, recommended skilled nursing facility discussed with patient's wife  Essential hypertension BP stable, continue amlodipine and Coreg  History of alcohol use Currently stable, no acute issues  Pressure injury POA, bilateral buttocks, deep tissue injury Wound care per nursing  Thrombocytosis -Likely reactive from sepsis, bacteremia, follow smear, CBC  Code Status: Full CODE STATUS DVT Prophylaxis: Apixaban Family Communication: Discussed in detail with the patient,   all imaging results, lab results explained to the patient.  Discussed with patient's wife yesterday.   Disposition Plan:   Time Spent in minutes 35 minutes  Procedures:  2D echo EF 60 to 65%, no vegetations  Consultants:   GI Infectious disease General surgery  Antimicrobials:      Medications  Scheduled Meds: . amLODipine  10 mg Oral Daily  . apixaban  5 mg Oral BID  . bisacodyl  10 mg Rectal Daily  . carvedilol  12.5 mg Oral  BID WC  . collagenase   Topical Daily  . liver oil-zinc oxide   Topical QID  . polyethylene glycol  17 g Oral Daily  . senna-docusate  1 tablet Oral BID   Continuous Infusions: .  sodium bicarbonate  infusion 1000 mL 100 mL/hr at 05/10/18 1216   PRN Meds:.acetaminophen, hydrALAZINE   Antibiotics   Anti-infectives (From admission, onward)   Start     Dose/Rate Route Frequency Ordered Stop   05/09/18 1205  vancomycin variable dose per unstable renal function (pharmacist dosing)  Status:  Discontinued      Does not apply See admin instructions 05/09/18 1205 05/09/18 1824   05/03/18 1400  ceFEPIme (MAXIPIME) 2 g in sodium chloride 0.9 % 100 mL IVPB  Status:  Discontinued     2 g 200 mL/hr over 30 Minutes Intravenous Every 24 hours 05/02/18 1526 05/03/18 1026   05/03/18 0330  vancomycin (VANCOCIN) IVPB 750 mg/150 ml premix  Status:  Discontinued     750 mg 150 mL/hr over 60 Minutes Intravenous Every 12 hours 05/02/18 1526 05/09/18 1205   05/02/18 1500  vancomycin (VANCOCIN) IVPB 1000 mg/200 mL premix     1,000 mg 200 mL/hr over 60 Minutes Intravenous NOW 05/02/18 1448 05/02/18 1620   05/02/18 1400  vancomycin (VANCOCIN) IVPB 1000 mg/200 mL premix     1,000 mg 200 mL/hr over 60 Minutes Intravenous  Once 05/02/18 1354 05/02/18 1520   05/02/18 1400  ceFEPIme (MAXIPIME) 2 g in sodium chloride 0.9 % 100 mL IVPB     2 g 200 mL/hr over 30 Minutes Intravenous  Once 05/02/18 1354 05/02/18 1445        Subjective:   Wayne Buckley was seen and examined today.  Had a large BM yesterday after the enema, temp of 100 F yesterday, afebrile today. Patient denies dizziness, chest pain, shortness of breath, new weakness  Objective:   Vitals:   05/09/18 1624 05/09/18 2140 05/10/18 0355 05/10/18 0923  BP: (!) 159/104 133/88 (!) 131/109 (!) 131/91  Pulse: 81 87 85 94  Resp: 19 18 18 18  Temp: 100 F (37.8 C) 98.4 F (36.9 C) 99 F (37.2 C) 98.2 F (36.8 C)  TempSrc: Oral Oral Oral Oral    SpO2: 95% 95% 94% 94%  Weight:  90.8 kg    Height:        Intake/Output Summary (Last 24 hours) at 05/10/2018 1409 Last data filed at 05/10/2018 0900 Gross per 24 hour  Intake 589.32 ml  Output -  Net 589.32 ml     Wt Readings from Last 3 Encounters:  05/09/18 90.8 kg  04/03/18 101.3 kg  03/02/18 99.8 kg    Physical Exam  General: Alert and awake, dysarthria  Eyes:   HEENT:    Cardiovascular: S1 S2 clear, RRR  Respiratory: Clear to auscultation bilaterally  Gastrointestinal: Soft, nontender, distended, hypoactive bowel sounds  Ext: no pedal edema bilaterally  Neuro: no new deficit  Musculoskeletal: No digital   cyanosis, clubbing  Skin: No rashes  Psych: Normal affect and demeanor, alert and oriented x3       Data Reviewed:  I have personally reviewed following labs and imaging studies  Micro Results Recent Results (from the past 240 hour(s))  Blood Culture (routine x 2)     Status: Abnormal   Collection Time: 05/02/18  1:46 PM  Result Value Ref Range Status   Specimen Description BLOOD RIGHT ANTECUBITAL  Final   Special Requests   Final    BOTTLES DRAWN AEROBIC AND ANAEROBIC Blood Culture adequate volume   Culture  Setup Time   Final    GRAM POSITIVE COCCI IN CLUSTERS IN BOTH AEROBIC AND ANAEROBIC BOTTLES CRITICAL VALUE NOTED.  VALUE IS CONSISTENT WITH PREVIOUSLY REPORTED AND CALLED VALUE. Performed at [facility], [place] [address], [place], [place] [address]    Culture STAPHYLOCOCCUS AUREUS (A)  Final   Report Status 05/05/2018 FINAL  Final  Blood Culture (routine x 2)     Status: Abnormal   Collection Time: 05/02/18  2:03 PM  Result Value Ref Range Status   Specimen Description BLOOD LEFT ANTECUBITAL  Final   Special Requests   Final    BOTTLES DRAWN AEROBIC AND ANAEROBIC Blood Culture adequate volume   Culture  Setup Time   Final    GRAM POSITIVE COCCI IN CLUSTERS IN BOTH AEROBIC AND ANAEROBIC BOTTLES CRITICAL RESULT CALLED TO, READ  BACK BY AND VERIFIED WITH: Jene Every PharmD 10:05 05/03/18 ([email or web address]) Performed at [facility], [place] [address] [place], [place] [address]    Culture METHICILLIN RESISTANT STAPHYLOCOCCUS AUREUS (A)  Final   Report Status 05/05/2018 FINAL  Final   Organism ID, Bacteria METHICILLIN RESISTANT STAPHYLOCOCCUS AUREUS  Final      Susceptibility   Methicillin resistant staphylococcus aureus - MIC*    CIPROFLOXACIN >=8 RESISTANT Resistant     ERYTHROMYCIN >=8 RESISTANT Resistant     GENTAMICIN <=0.5 SENSITIVE Sensitive     OXACILLIN >=4 RESISTANT Resistant     TETRACYCLINE <=1 SENSITIVE Sensitive     VANCOMYCIN 1 SENSITIVE Sensitive     TRIMETH/SULFA >=320 RESISTANT Resistant     CLINDAMYCIN <=0.25 SENSITIVE Sensitive     RIFAMPIN <=0.5 SENSITIVE Sensitive     Inducible Clindamycin NEGATIVE Sensitive     * METHICILLIN RESISTANT STAPHYLOCOCCUS AUREUS  Blood Culture ID Panel (Reflexed)     Status: Abnormal   Collection Time: 05/02/18  2:03 PM  Result Value Ref Range Status   Enterococcus species NOT DETECTED NOT DETECTED Final   Listeria monocytogenes NOT DETECTED NOT DETECTED Final   Staphylococcus species DETECTED (A) NOT DETECTED Final    Comment: CRITICAL RESULT CALLED TO, READ BACK BY AND VERIFIED WITH: Jene Every PharmD 10:05 05/03/18 ([email or web address])    Staphylococcus aureus (BCID) DETECTED (A) NOT DETECTED Final    Comment: Methicillin (oxacillin)-resistant Staphylococcus aureus (MRSA). MRSA is predictably resistant to beta-lactam antibiotics (except ceftaroline). Preferred therapy is vancomycin unless clinically contraindicated. Patient requires contact precautions if  hospitalized. CRITICAL RESULT CALLED TO, READ BACK BY AND VERIFIED WITH: Jene Every PharmD 10:05 05/03/18 ([email or web address])    Methicillin resistance DETECTED (A) NOT DETECTED Final    Comment: CRITICAL RESULT CALLED TO, READ BACK BY AND VERIFIED WITH: Jene Every PharmD 10:05 05/03/18 ([email or web address])    Streptococcus species NOT DETECTED NOT  DETECTED Final   Streptococcus agalactiae NOT DETECTED NOT DETECTED Final   Streptococcus pneumoniae NOT DETECTED NOT DETECTED Final   Streptococcus pyogenes NOT DETECTED  NOT DETECTED Final   Acinetobacter baumannii NOT DETECTED NOT DETECTED Final   Enterobacteriaceae species NOT DETECTED NOT DETECTED Final   Enterobacter cloacae complex NOT DETECTED NOT DETECTED Final   Escherichia coli NOT DETECTED NOT DETECTED Final   Klebsiella oxytoca NOT DETECTED NOT DETECTED Final   Klebsiella pneumoniae NOT DETECTED NOT DETECTED Final   Proteus species NOT DETECTED NOT DETECTED Final   Serratia marcescens NOT DETECTED NOT DETECTED Final   Haemophilus influenzae NOT DETECTED NOT DETECTED Final   Neisseria meningitidis NOT DETECTED NOT DETECTED Final   Pseudomonas aeruginosa NOT DETECTED NOT DETECTED Final   Candida albicans NOT DETECTED NOT DETECTED Final   Candida glabrata NOT DETECTED NOT DETECTED Final   Candida krusei NOT DETECTED NOT DETECTED Final   Candida parapsilosis NOT DETECTED NOT DETECTED Final   Candida tropicalis NOT DETECTED NOT DETECTED Final    Comment: Performed at [facility], [place] [address] [place], [place] [address]  MRSA PCR Screening     Status: Abnormal   Collection Time: 05/03/18  6:12 AM  Result Value Ref Range Status   MRSA by PCR POSITIVE (A) NEGATIVE Final    Comment:        The GeneXpert MRSA Assay (FDA approved for NASAL specimens only), is one component of a comprehensive MRSA colonization surveillance program. It is not intended to diagnose MRSA infection nor to guide or monitor treatment for MRSA infections. RESULT CALLED TO, READ BACK BY AND VERIFIED WITH: Precious Bard RN 9:40 05/03/18 ([email or web address]) Performed at [facility], [place] [address] [place], [place] [address]   Culture, blood (routine x 2)     Status: None   Collection Time: 05/03/18 10:01 PM  Result Value Ref Range Status   Specimen Description BLOOD RIGHT HAND  Final   Special  Requests   Final    BOTTLES DRAWN AEROBIC AND ANAEROBIC Blood Culture adequate volume   Culture   Final    NO GROWTH 5 DAYS Performed at [facility], [place] [address], [place], [place] [address]    Report Status 05/08/2018 FINAL  Final  Culture, blood (routine x 2)     Status: None   Collection Time: 05/03/18 10:09 PM  Result Value Ref Range Status   Specimen Description BLOOD LEFT HAND  Final   Special Requests   Final    BOTTLES DRAWN AEROBIC ONLY Blood Culture adequate volume   Culture   Final    NO GROWTH 5 DAYS Performed at [facility], [place] [address], [place], [place] [address]    Report Status 05/08/2018 FINAL  Final    Radiology Reports Ct Abdomen Pelvis Wo Contrast  Result Date: 05/02/2018 CLINICAL DATA:  Abdominal distention EXAM: CT ABDOMEN AND PELVIS WITHOUT CONTRAST TECHNIQUE: Multidetector CT imaging of the abdomen and pelvis was performed following the standard protocol without IV contrast. COMPARISON:  Same day radiographs of the abdomen and pelvis FINDINGS: Lower chest: Top-normal heart size. Trace pleural effusions with bibasilar atelectasis. Hepatobiliary: No focal liver abnormality is seen. No gallstones, gallbladder wall thickening, or biliary dilatation. Pancreas: Unremarkable. No pancreatic ductal dilatation or surrounding inflammatory changes. Spleen: Normal in size without focal abnormality. Adrenals/Urinary Tract: Normal bilateral adrenal glands. No nephrolithiasis. Motion artifacts limit assessment at the level of the kidneys however there does appear to be a hypodense cystic lesion of the interpolar left kidney measuring up to 6 cm. No nephrolithiasis nor hydroureteronephrosis. Foley decompressed urinary bladder. Stomach/Bowel: Small hiatal hernia. Decompressed  stomach and small bowel loops. Normal small bowel rotation. Moderate gaseous distention of the colon up to 9 cm at the level of the cecum with liquid stool noted within from cecum to distal  descending colon likely representing large bowel dysmotility or ileus. Circumferential thickening surrounding a small stool ball is noted within the rectum. Mild stercoral proctitis is not excluded. Vascular/Lymphatic: IVC filter in place. Atherosclerosis of the aorta without aneurysm. No adenopathy. Reproductive: Normal size prostate. Other: Soft tissue anasarca. No free air or free fluid. Musculoskeletal: Status post ventral hernia repair. Small amount of fat in the right inguinal canal. IMPRESSION: 1. Moderate gaseous distention of the colon up to 9 cm at the level of the cecum with liquid stool noted within from cecum to distal descending colon. Findings may represent a large bowel dysmotility or ileus. Mechanical source of obstruction is identified. 2. Circumferential thickening surrounding a small stool ball within the rectum. Mild stercoral proctitis could have this appearance. 3. 6 cm hypodense cystic lesion of the interpolar left kidney, likely a simple cyst however further characterization is limited due to motion. 4. IVC filter in place. 5. Mild soft tissue anasarca. Electronically Signed   By: David  Kwon M.D.   On: 05/02/2018 16:46   Dg Abdomen 1 View  Result Date: 05/02/2018 CLINICAL DATA:  67 y/o  M; abdominal distention. EXAM: ABDOMEN - 1 VIEW COMPARISON:  05/02/2018 CT abdomen and pelvis. FINDINGS: Diffuse air-filled distention of the large bowel. Upper abdomen is excluded from the field of view. IVC filter in situ. Advanced degenerative changes of the visible lumbar spine. Lower abdominal wall mesh hernia repair anchors noted. IMPRESSION: Diffuse air-filled distention of the large bowel, possibly chronic large bowel dysmotility or ileus. Electronically Signed   By: Lance  Furusawa-Stratton M.D.   On: 05/02/2018 20:39   Dg Esophagus  Result Date: 05/09/2018 CLINICAL DATA:  Dysphagia EXAM: ESOPHOGRAM/BARIUM SWALLOW TECHNIQUE: Single contrast examination was performed using  thin barium.  FLUOROSCOPY TIME:  Fluoroscopy Time:  2 minutes 24 seconds Radiation Exposure Index (if provided by the fluoroscopic device): Number of Acquired Spot Images: 0 COMPARISON:  None. FINDINGS: Limited study due to patient's condition. There are no fixed strictures visualized. No reflux noted during the study. The patient had difficulty swallowing the 13 mm barium tablet which remains present in the back of the throat for several swallows, finally passing into the esophagus. This sticks in the mid to distal esophagus despite the lack of visible stricture in this area. IMPRESSION: No visible stricture seen, but the 13 mm barium tablet sticks in the mid to distal esophagus. May consider further evaluation with endoscopy. Electronically Signed   By: Kevin  Dover M.D.   On: 05/09/2018 10:37   Dg Chest Port 1 View  Result Date: 05/02/2018 CLINICAL DATA:  Fever EXAM: PORTABLE CHEST 1 VIEW COMPARISON:  03/03/2018 FINDINGS: The heart size and mediastinal contours are within normal limits. Both lungs are clear. The visualized skeletal structures are unremarkable. IMPRESSION: No active disease. Electronically Signed   By: Charles  Clark M.D.   On: 05/02/2018 14:35   Dg Abd 2 Views  Result Date: 05/09/2018 CLINICAL DATA:  Abdominal distention. EXAM: ABDOMEN - 2 VIEW COMPARISON:  05/04/2018, 05/02/2018.  CT 05/02/2018. FINDINGS: IVC filter noted tilted over the upper portion of the inferior vena cava in unchanged position. Distended loops of colon again noted. Colon is slightly less distended on today's exam. Several nonspecific air-filled loops of small bowel noted. No free air is identified. Degenerative changes   lumbar spine. IMPRESSION: Dilated loops of colon again noted. Colon is slightly less distended on today's exam. Electronically Signed   By: Thomas  Register   On: 05/09/2018 09:58   Dg Abd Portable 1v  Result Date: 05/04/2018 CLINICAL DATA:  Follow-up ileus EXAM: PORTABLE ABDOMEN - 1 VIEW COMPARISON:   05/02/2018 FINDINGS: Distended colon is again noted and stable consistent with generalized ileus. No definitive obstructive changes are seen. Postsurgical changes in the low pelvis are noted. Degenerative changes of the lumbar spine are seen. No free air is noted. IMPRESSION: Stable gaseous dilatation of the colon consistent with ileus. Electronically Signed   By: Mark  Lukens M.D.   On: 05/04/2018 09:40    Lab Data:  CBC: Recent Labs  Lab 05/06/18 0538 05/07/18 0403 05/08/18 0522 05/09/18 0644 05/10/18 0642  WBC 12.5* 15.2* 19.0* 19.2* 23.1*  HGB 8.4* 8.4* 9.2* 9.0* 8.6*  HCT 26.4* 26.7* 29.3* 29.4* 28.2*  MCV 88.0 89.0 88.3 89.6 89.2  PLT 580* 755* 899* 849* 915*   Basic Metabolic Panel: Recent Labs  Lab 05/07/18 0403 05/08/18 0522 05/09/18 0644 05/09/18 1041 05/10/18 0642  NA 138 137 135 135 133*  K 3.8 4.0 4.7 4.9 4.7  CL 112* 110 109 110 109  CO2 21* 21* 20* 18* 17*  GLUCOSE 107* 122* 124* 112* 103*  BUN 8 <5* 11 12 19  CREATININE 0.79 0.84 1.74* 1.94* 2.75*  CALCIUM 8.0* 8.3* 8.6* 8.4* 8.5*  MG  --   --   --   --  1.9   GFR: Estimated Creatinine Clearance: 28.6 mL/min (A) (by C-G formula based on SCr of 2.75 mg/dL (H)). Liver Function Tests: No results for input(s): AST, ALT, ALKPHOS, BILITOT, PROT, ALBUMIN in the last 168 hours. No results for input(s): LIPASE, AMYLASE in the last 168 hours. No results for input(s): AMMONIA in the last 168 hours. Coagulation Profile: No results for input(s): INR, PROTIME in the last 168 hours. Cardiac Enzymes: No results for input(s): CKTOTAL, CKMB, CKMBINDEX, TROPONINI in the last 168 hours. BNP (last 3 results) No results for input(s): PROBNP in the last 8760 hours. HbA1C: No results for input(s): HGBA1C in the last 72 hours. CBG: No results for input(s): GLUCAP in the last 168 hours. Lipid Profile: No results for input(s): CHOL, HDL, LDLCALC, TRIG, CHOLHDL, LDLDIRECT in the last 72 hours. Thyroid Function Tests: No  results for input(s): TSH, T4TOTAL, FREET4, T3FREE, THYROIDAB in the last 72 hours. Anemia Panel: No results for input(s): VITAMINB12, FOLATE, FERRITIN, TIBC, IRON, RETICCTPCT in the last 72 hours. Urine analysis:    Component Value Date/Time   COLORURINE YELLOW 05/06/2018 1609   APPEARANCEUR TURBID (A) 05/06/2018 1609   LABSPEC 1.012 05/06/2018 1609   PHURINE 5.0 05/06/2018 1609   GLUCOSEU NEGATIVE 05/06/2018 1609   HGBUR NEGATIVE 05/06/2018 1609   BILIRUBINUR NEGATIVE 05/06/2018 1609   KETONESUR NEGATIVE 05/06/2018 1609   PROTEINUR NEGATIVE 05/06/2018 1609   UROBILINOGEN 0.2 05/08/2012 0314   NITRITE NEGATIVE 05/06/2018 1609   LEUKOCYTESUR NEGATIVE 05/06/2018 1609     Ripudeep Rai M.D. [facility] 05/10/2018, 2:09 PM  Pager: [phone] Between 7am to 7pm - call Pager - [phone]  After 7pm go to [email or web address] - password [email or web address]  Call night coverage person covering after 7pm   

## 2018-05-10 NOTE — Progress Notes (Signed)
Physical Therapy Treatment Patient Details Name: Wayne Buckley Brook MRN: [id number] DOB: 04/10/1951 Today's Date: 05/10/2018    History of Present Illness Pt is a 67 y/o male with a PMH signficant for TBI, central cord syndrome, neurogenic bladder with indwelling foley catheter, BLE weakness, prior DVT, HTN, alcohol abuse presenting to the hospital for evaluation of fever. In ED, urine culture grew MRSA and CT abdomen revealed possible large bowel dysmotility or ileus.     PT Comments    Pt progressing slowly towards physical therapy goals. Tolerance for functional activity remains low and pt was unable to tolerate transition to recliner chair with Stedy as he could not maintain upright trunk posture long enough to move the equipment. Recommend up to chair with Maxi-Move with nursing staff at this time. Will continue to follow.   Follow Up Recommendations  SNF;Supervision/Assistance - 24 hour     Equipment Recommendations  None recommended by PT    Recommendations for Other Services OT consult     Precautions / Restrictions Precautions Precautions: Fall Restrictions Weight Bearing Restrictions: No    Mobility  Bed Mobility Overal bed mobility: Needs Assistance Bed Mobility: Rolling;Sidelying to Sit Rolling: Mod assist Sidelying to sit: Mod assist;+2 for physical assistance;HOB elevated       General bed mobility comments: Heavy +2 mod to transition to EOB. Pt requiring increased tactile cueing into physical assist to initiate.   Transfers Overall transfer level: Needs assistance   Transfers: Sit to/from Stand Sit to Stand: +2 physical assistance;From elevated surface;Max assist         General transfer comment: VC's for sequencing and safety. Pt required assist to position feet on footplate on Stedy, and +2 assist to power-up to full stand. Pt impulsive with [facility] to initiate pull to stand. Pt participated in x5 sit to stands throughout session as he  was having a bowel movement while standing and requried cleaning. Pt fatiguing with multiple sit to stands and not safe to transfer to chair due to significant trunk flexion. NT called in to assist for safety and by end of session +3 max assist was provided to get pt back to bed.   Ambulation/Gait             General Gait Details: Unable   Social research officer, governmenttairs             Wheelchair Mobility    Modified Rankin (Stroke Patients Only)       Balance Overall balance assessment: Needs assistance Sitting-balance support: Bilateral upper extremity supported;Feet supported Sitting balance-[name] Scale: Poor Sitting balance - Comments: Can pull himself forward in recliner with arms of recliner, but cannot maintain without UE support   Standing balance support: Bilateral upper extremity supported;During functional activity Standing balance-[name] Scale: Zero Standing balance comment: +2 assist required                            Cognition Arousal/Alertness: Awake/alert Behavior During Therapy: Restless Overall Cognitive Status: History of cognitive impairments - at baseline                                 General Comments: Wife present during session and a big motivator for pt. Would recommend wife be present for future therapy sessions      Exercises      General Comments        Pertinent  Vitals/Pain Pain Assessment: Faces Faces Pain Scale: Hurts a little bit Pain Location: General grimacing with movement Pain Descriptors / Indicators: Grimacing;Guarding Pain Intervention(s): Monitored during session    Home Living                      Prior Function            PT Goals (current goals can now be found in the care plan section) Acute Rehab PT Goals Patient Stated Goal: Sit in the chair PT Goal Formulation: With family Time For Goal Achievement: 05/17/18 Potential to Achieve Goals: Good Progress towards PT goals: Progressing toward  goals    Frequency    Min 2X/week      PT Plan Current plan remains appropriate    Co-evaluation              AM-PAC PT "6 Clicks" Daily Activity  Outcome Measure  Difficulty turning over in bed (including adjusting bedclothes, sheets and blankets)?: Unable Difficulty moving from lying on back to sitting on the side of the bed? : Unable Difficulty sitting down on and standing up from a chair with arms (e.g., wheelchair, bedside commode, etc,.)?: Unable Help needed moving to and from a bed to chair (including a wheelchair)?: Total Help needed walking in hospital room?: Total Help needed climbing 3-5 steps with a railing? : Total 6 Click Score: 6    End of Session Equipment Utilized During Treatment: Gait belt Activity Tolerance: Patient tolerated treatment well Patient left: in chair;with call bell/phone within reach;with chair alarm set;with family/visitor present Nurse Communication: Mobility status;Need for lift equipment PT Visit Diagnosis: History of falling (Z91.81);Difficulty in walking, not elsewhere classified (R26.2)     Time: 1610-9604 PT Time Calculation (min) (ACUTE ONLY): 45 min  Charges:  $Therapeutic Activity: 38-52 mins                     Conni Slipper, PT, DPT Acute Rehabilitation Services Pager: [phone] Office: [phone]    Marylynn Pearson 05/10/2018, 3:07 PM

## 2018-05-10 NOTE — Progress Notes (Signed)
There is no output documented for the patient on this shift. RN report to night shift RN to scan the patient.  Will continue to monitor patient.

## 2018-05-10 NOTE — Clinical Social Work Note (Signed)
CSW continuing to follow patient's progress and he is not medically stable for discharge at this time. CSW will continue to monitor patient's progress, provide SW intervention services as needed and assist with discharge to the appropriate venue when medically stable for discharge.  Genelle BalVanessa Suvi Archuletta, MSW, LCSW Licensed Clinical Social Worker Clinical Social Work Department [facility] [phone]

## 2018-05-10 NOTE — Progress Notes (Signed)
[facility]  Date of Admission:  05/02/2018     Total days of antibiotics 9         ASSESSMENT/PLAN  Mr. Ophelia Shoulder continues to receive treatment for MRSA bacteremia of unclear source. He continues to have increased temperatures and worsening leukocytosis despite vancomycin with therapeutic drug levels. Unable to rule out endocarditis. His wound on his sacrum is superficial and unlikely the source. Clinically he feels okay. He has also had worsening renal function and now with acute kidney injury of unclear origin. Vancomycin has been changed to Daptomycin and repeat blood cultures are pending.   1. Continue daptomycin. 2. Monitor fevers, WBC count and cultures. 3. Ilieus/abdominal distention per GI and primary team.  4. Will need central line placement, likely tunneled catheter.    Principal Problem:   Ogilvie's syndrome Active Problems:   Alcohol abuse   AKI (acute kidney injury) (HCC)   Central cord syndrome (HCC)   Essential hypertension   Sepsis (HCC)   Chronic anemia   Hyponatremia   Sacral ulcer (HCC)   History of DVT (deep vein thrombosis)   Traumatic brain injury (HCC)   MRSA bacteremia   Bacteremia   . amLODipine  10 mg Oral Daily  . apixaban  5 mg Oral BID  . bisacodyl  10 mg Rectal Daily  . carvedilol  12.5 mg Oral BID WC  . collagenase   Topical Daily  . liver oil-zinc oxide   Topical QID  . polyethylene glycol  17 g Oral Daily  . senna-docusate  1 tablet Oral BID    SUBJECTIVE:  Mildly elevated temperature of 100 last evening with no further fevers. WBC count continues to rise at 23.1. Creatinine worsening at 2.75 despite 1 L bolus of fluid. Most recent vancomycin trough of 17.  No Known Allergies   Review of Systems: Review of Systems  Constitutional: Negative for chills and fever.  Respiratory: Negative for cough, sputum production, shortness of breath and wheezing.   Cardiovascular: Negative for chest pain and leg  swelling.  Gastrointestinal: Negative for abdominal pain, constipation, diarrhea, nausea and vomiting.  Skin: Negative for rash.      OBJECTIVE: Vitals:   05/09/18 1624 05/09/18 2140 05/10/18 0355 05/10/18 0923  BP: (!) 159/104 133/88 (!) 131/109 (!) 131/91  Pulse: 81 87 85 94  Resp: 19 18 18 18   Temp: 100 F (37.8 C) 98.4 F (36.9 C) 99 F (37.2 C) 98.2 F (36.8 C)  TempSrc: Oral Oral Oral Oral  SpO2: 95% 95% 94% 94%  Weight:  90.8 kg    Height:       Body mass index is 29.56 kg/m.  Physical Exam  Constitutional: He is oriented to person, place, and time. He appears well-developed and well-nourished. No distress.  Lying in bed with head of bed slightly elevated; pleasant  Cardiovascular: Normal rate, regular rhythm, normal heart sounds and intact distal pulses. Exam reveals no gallop and no friction rub.  No murmur heard. Pulmonary/Chest: Effort normal and breath sounds normal.  Abdominal: He exhibits distension. There is no tenderness. There is no rigidity, no rebound, no guarding and no CVA tenderness.  Neurological: He is alert and oriented to person, place, and time.  Skin: Skin is warm and dry.  Psychiatric: He has a normal mood and affect. His behavior is normal. Judgment and thought content normal.    Lab Results Lab Results  Component Value Date   WBC 23.1 (H) 05/10/2018   HGB  8.6 (L) 05/10/2018   HCT 28.2 (L) 05/10/2018   MCV 89.2 05/10/2018   PLT 915 (HH) 05/10/2018    Lab Results  Component Value Date   CREATININE 2.75 (H) 05/10/2018   BUN 19 05/10/2018   NA 133 (L) 05/10/2018   K 4.7 05/10/2018   CL 109 05/10/2018   CO2 17 (L) 05/10/2018    Lab Results  Component Value Date   ALT 36 05/02/2018   AST 83 (H) 05/02/2018   ALKPHOS 67 05/02/2018   BILITOT 0.5 05/02/2018     Microbiology: Recent Results (from the past 240 hour(s))  Blood Culture (routine x 2)     Status: Abnormal   Collection Time: 05/02/18  1:46 PM  Result Value Ref Range  Status   Specimen Description BLOOD RIGHT ANTECUBITAL  Final   Special Requests   Final    BOTTLES DRAWN AEROBIC AND ANAEROBIC Blood Culture adequate volume   Culture  Setup Time   Final    GRAM POSITIVE COCCI IN CLUSTERS IN BOTH AEROBIC AND ANAEROBIC BOTTLES CRITICAL VALUE NOTED.  VALUE IS CONSISTENT WITH PREVIOUSLY REPORTED AND CALLED VALUE. Performed at [facility], [address] [place], [place] [address]    Culture STAPHYLOCOCCUS AUREUS (A)  Final   Report Status 05/05/2018 FINAL  Final  Blood Culture (routine x 2)     Status: Abnormal   Collection Time: 05/02/18  2:03 PM  Result Value Ref Range Status   Specimen Description BLOOD LEFT ANTECUBITAL  Final   Special Requests   Final    BOTTLES DRAWN AEROBIC AND ANAEROBIC Blood Culture adequate volume   Culture  Setup Time   Final    GRAM POSITIVE COCCI IN CLUSTERS IN BOTH AEROBIC AND ANAEROBIC BOTTLES CRITICAL RESULT CALLED TO, READ BACK BY AND VERIFIED WITH: Gay FillerM. Pham PharmD 10:05 05/03/18 ([email or web address]) Performed at [facility], [address] [place], [place] [address]    Culture METHICILLIN RESISTANT STAPHYLOCOCCUS AUREUS (A)  Final   Report Status 05/05/2018 FINAL  Final   Organism ID, Bacteria METHICILLIN RESISTANT STAPHYLOCOCCUS AUREUS  Final      Susceptibility   Methicillin resistant staphylococcus aureus - MIC*    CIPROFLOXACIN >=8 RESISTANT Resistant     ERYTHROMYCIN >=8 RESISTANT Resistant     GENTAMICIN <=0.5 SENSITIVE Sensitive     OXACILLIN >=4 RESISTANT Resistant     TETRACYCLINE <=1 SENSITIVE Sensitive     VANCOMYCIN 1 SENSITIVE Sensitive     TRIMETH/SULFA >=320 RESISTANT Resistant     CLINDAMYCIN <=0.25 SENSITIVE Sensitive     RIFAMPIN <=0.5 SENSITIVE Sensitive     Inducible Clindamycin NEGATIVE Sensitive     * METHICILLIN RESISTANT STAPHYLOCOCCUS AUREUS  Blood Culture ID Panel (Reflexed)     Status: Abnormal   Collection Time: 05/02/18  2:03 PM  Result Value Ref Range Status   Enterococcus  species NOT DETECTED NOT DETECTED Final   Listeria monocytogenes NOT DETECTED NOT DETECTED Final   Staphylococcus species DETECTED (A) NOT DETECTED Final    Comment: CRITICAL RESULT CALLED TO, READ BACK BY AND VERIFIED WITH: Gay FillerM. Pham PharmD 10:05 05/03/18 ([email or web address])    Staphylococcus aureus (BCID) DETECTED (A) NOT DETECTED Final    Comment: Methicillin (oxacillin)-resistant Staphylococcus aureus (MRSA). MRSA is predictably resistant to beta-lactam antibiotics (except ceftaroline). Preferred therapy is vancomycin unless clinically contraindicated. Patient requires contact precautions if  hospitalized. CRITICAL RESULT CALLED TO, READ BACK BY AND VERIFIED WITH: Gay FillerM. Pham PharmD 10:05 05/03/18 ([email or web address])    Methicillin  resistance DETECTED (A) NOT DETECTED Final    Comment: CRITICAL RESULT CALLED TO, READ BACK BY AND VERIFIED WITH: Gay Filler PharmD 10:05 05/03/18 (wilsonm)    Streptococcus species NOT DETECTED NOT DETECTED Final   Streptococcus agalactiae NOT DETECTED NOT DETECTED Final   Streptococcus pneumoniae NOT DETECTED NOT DETECTED Final   Streptococcus pyogenes NOT DETECTED NOT DETECTED Final   Acinetobacter baumannii NOT DETECTED NOT DETECTED Final   Enterobacteriaceae species NOT DETECTED NOT DETECTED Final   Enterobacter cloacae complex NOT DETECTED NOT DETECTED Final   Escherichia coli NOT DETECTED NOT DETECTED Final   Klebsiella oxytoca NOT DETECTED NOT DETECTED Final   Klebsiella pneumoniae NOT DETECTED NOT DETECTED Final   Proteus species NOT DETECTED NOT DETECTED Final   Serratia marcescens NOT DETECTED NOT DETECTED Final   Haemophilus influenzae NOT DETECTED NOT DETECTED Final   Neisseria meningitidis NOT DETECTED NOT DETECTED Final   Pseudomonas aeruginosa NOT DETECTED NOT DETECTED Final   Candida albicans NOT DETECTED NOT DETECTED Final   Candida glabrata NOT DETECTED NOT DETECTED Final   Candida krusei NOT DETECTED NOT DETECTED Final   Candida parapsilosis NOT DETECTED NOT  DETECTED Final   Candida tropicalis NOT DETECTED NOT DETECTED Final    Comment: Performed at [facility], [address], [place], [place] [address]  MRSA PCR Screening     Status: Abnormal   Collection Time: 05/03/18  6:12 AM  Result Value Ref Range Status   MRSA by PCR POSITIVE (A) NEGATIVE Final    Comment:        The GeneXpert MRSA Assay (FDA approved for NASAL specimens only), is one component of a comprehensive MRSA colonization surveillance program. It is not intended to diagnose MRSA infection nor to guide or monitor treatment for MRSA infections. RESULT CALLED TO, READ BACK BY AND VERIFIED WITH: Lyla Glassing RN 9:40 05/03/18 (wilsonm) Performed at [facility], [address] [place], [place] [address]   Culture, blood (routine x 2)     Status: None   Collection Time: 05/03/18 10:01 PM  Result Value Ref Range Status   Specimen Description BLOOD RIGHT HAND  Final   Special Requests   Final    BOTTLES DRAWN AEROBIC AND ANAEROBIC Blood Culture adequate volume   Culture   Final    NO GROWTH 5 DAYS Performed at [facility], [address], [place], [place] [address]    Report Status 05/08/2018 FINAL  Final  Culture, blood (routine x 2)     Status: None   Collection Time: 05/03/18 10:09 PM  Result Value Ref Range Status   Specimen Description BLOOD LEFT HAND  Final   Special Requests   Final    BOTTLES DRAWN AEROBIC ONLY Blood Culture adequate volume   Culture   Final    NO GROWTH 5 DAYS Performed at [facility], [address] [place], [place] [address]    Report Status 05/08/2018 FINAL  Final     Marcos Eke, NP [facility] [phone] Pager  05/10/2018  1:40 PM

## 2018-05-11 ENCOUNTER — Inpatient Hospital Stay (HOSPITAL_COMMUNITY): Payer: Medicare Other

## 2018-05-11 LAB — CBC
HEMATOCRIT: 23.1 % — AB (ref 39.0–52.0)
HEMOGLOBIN: 7.4 g/dL — AB (ref 13.0–17.0)
MCH: 28.2 pg (ref 26.0–34.0)
MCHC: 32 g/dL (ref 30.0–36.0)
MCV: 88.2 fL (ref 80.0–100.0)
NRBC: 0 % (ref 0.0–0.2)
Platelets: 701 10*3/uL — ABNORMAL HIGH (ref 150–400)
RBC: 2.62 MIL/uL — AB (ref 4.22–5.81)
RDW: 15.2 % (ref 11.5–15.5)
WBC: 17.1 10*3/uL — AB (ref 4.0–10.5)

## 2018-05-11 LAB — BASIC METABOLIC PANEL
Anion gap: 9 (ref 5–15)
BUN: 26 mg/dL — AB (ref 8–23)
CHLORIDE: 104 mmol/L (ref 98–111)
CO2: 20 mmol/L — ABNORMAL LOW (ref 22–32)
Calcium: 7.8 mg/dL — ABNORMAL LOW (ref 8.9–10.3)
Creatinine, Ser: 3.44 mg/dL — ABNORMAL HIGH (ref 0.61–1.24)
GFR, EST AFRICAN AMERICAN: 20 mL/min — AB (ref >60)
GFR, EST NON AFRICAN AMERICAN: 17 mL/min — AB (ref >60)
Glucose, Bld: 107 mg/dL — ABNORMAL HIGH (ref 70–99)
Potassium: 3.2 mmol/L — ABNORMAL LOW (ref 3.5–5.1)
SODIUM: 133 mmol/L — AB (ref 135–145)

## 2018-05-11 LAB — GLUCOSE, CAPILLARY: Glucose-Capillary: 111 mg/dL — ABNORMAL HIGH (ref 70–99)

## 2018-05-11 LAB — APTT: APTT: 53 s — AB (ref 24–36)

## 2018-05-11 LAB — CK: CK TOTAL: 26 U/L — AB (ref 49–397)

## 2018-05-11 MED ORDER — POTASSIUM CHLORIDE CRYS ER 20 MEQ PO TBCR
40.0000 meq | EXTENDED_RELEASE_TABLET | Freq: Once | ORAL | Status: AC
  Administered 2018-05-11: 40 meq via ORAL
  Filled 2018-05-11: qty 2

## 2018-05-11 MED ORDER — POLYETHYLENE GLYCOL 3350 17 G PO PACK
17.0000 g | PACK | Freq: Two times a day (BID) | ORAL | Status: DC
  Administered 2018-05-13 – 2018-05-19 (×13): 17 g via ORAL
  Filled 2018-05-11 (×14): qty 1

## 2018-05-11 NOTE — Progress Notes (Signed)
Foley catheter changed d/t no urinary output during the day and bladder scan showing > 900cc. Pt tolerated well approximately 2000cc return. Urine is amber in color with some sediment noted.

## 2018-05-11 NOTE — Progress Notes (Signed)
[facility]                                                                              Patient Demographics  Wayne Buckley, is a 67 y.o. male, DOB - 03-12-1951, VZD:[id number]  Admit date - 05/02/2018   Admitting Physician Shela Leff, MD  Outpatient Primary MD for the patient is [name], MD  Outpatient specialists:   LOS - 9  days   Medical records reviewed and are as summarized below:    Chief Complaint  Patient presents with  . Fever       Brief summary    Wayne Buckley is a 67 y.o. malewith medical history significant oftraumatic brain injury, central cord syndrome, neurogenic bladder with indwelling Foley catheter, bilateral leg weakness, prior DVT, hypertension, alcohol abuse. He presented secondary to fever. He was found to have MRSA in his urine prior to arrival. Blood cultures positive for MRSA and CT abdomen concerning for ileus.  Assessment & Plan    Principal Problem: Sepsis -Patient met criteria for sepsis at the time of admission, source is secondary to UTI and MRSA bacteremia  Active problems MRSA bacteremia, MRSA UTI -Patient empirically treated with vancomycin and cefepime. -Repeat blood cultures 11/15 showed no growth, leukocytosis still trending up -2D echo showed no evidence of vegetations - ID following, initially was placed on IV vancomycin, now changed to daptomycin.   - TEE was unsuccessful due to ?obstruction, esophagogram showed no stricture -Leukocytosis improving  Acute kidney injury with metabolic acidosis  -Vancomycin discontinued, placed on IV fluids with bicarb   -Renal function continues to worsen, creatinine 3.4 today.  Overnight had urinary retention issues, bladder scan showed>900cc, Foley cath changed, patient had 2 L output. -Obtain renal ultrasound -Continue IV fluid hydration, nephrology consult called  Ileus/abdominal distention -Patient has been followed by general surgery and  gastroenterology, differentials including Ogilvie's syndrome, mechanical component - Esophagogram results discussed with GI, Dr. [name].  Per GI, patient will not be able to tolerate EGD with multiple comorbidities currently and no obvious stricture on the esophagram. -Regarding ileus, recommended enemas, advance diet, increase mobility, sit upright.  Patient is not a candidate for neostigmine or colonoscopy decompression.  Per GI, this is commonly seen in patients with TBI and cord compression and will spontaneously resolve. -Abdomen feels less distended today, continue full liquid diet, continue bowel regimen, enema as needed  Urinary retention with neurogenic bladder -Per admission history patient with indwelling Foley catheter secondary to neurogenic bladder, TBI and cord compression - follow outpatient with urology and outpatient voiding trial, patient was supposed to have appointment today on 11/20.  Changed Foley catheter overnight  Anemia, likely chronic Hemoglobin down to 7.4 today, likely due to hemodilution from IV fluids, no bleeding  Leukocytosis Worsening, likely due to #1 and #2, follow closely, blood cultures been negative so far, afebrile  History of DVT, left lower extremity, diagnosed in 02/2018 Continue Eliquis  History of TBI -Reviewed recommendations from inpatient rehab, recommended skilled nursing facility discussed with patient's wife  Essential hypertension BP stable, continue amlodipine and Coreg  History of alcohol use Currently stable, no acute  issues  Pressure injury POA, bilateral buttocks, deep tissue injury Wound care per nursing  Thrombocytosis -Likely reactive from sepsis, bacteremia, platelet count improving  Code Status: Full CODE STATUS DVT Prophylaxis: Apixaban Family Communication: Discussed in detail with the patient, all imaging results, lab results explained to the patient.  Discussed with patient's wife in detail  today   Disposition Plan:   Time Spent in minutes 35 minutes  Procedures:  2D echo EF 60 to 65%, no vegetations  Consultants:   GI Infectious disease General surgery Nephrology  Antimicrobials:      Medications  Scheduled Meds: . amLODipine  10 mg Oral Daily  . apixaban  5 mg Oral BID  . bisacodyl  10 mg Rectal Daily  . carvedilol  12.5 mg Oral BID WC  . collagenase   Topical Daily  . liver oil-zinc oxide   Topical QID  . polyethylene glycol  17 g Oral Daily  . senna-docusate  1 tablet Oral BID   Continuous Infusions: . DAPTOmycin (CUBICIN)  IV 730 mg (05/10/18 2056)  .  sodium bicarbonate  infusion 1000 mL 100 mL/hr at 05/11/18 0957   PRN Meds:.acetaminophen, hydrALAZINE   Antibiotics   Anti-infectives (From admission, onward)   Start     Dose/Rate Route Frequency Ordered Stop   05/10/18 2000  DAPTOmycin (CUBICIN) 730 mg in sodium chloride 0.9 % IVPB     730 mg 229.2 mL/hr over 30 Minutes Intravenous Every 48 hours 05/10/18 1414     05/09/18 1205  vancomycin variable dose per unstable renal function (pharmacist dosing)  Status:  Discontinued      Does not apply See admin instructions 05/09/18 1205 05/09/18 1824   05/03/18 1400  ceFEPIme (MAXIPIME) 2 g in sodium chloride 0.9 % 100 mL IVPB  Status:  Discontinued     2 g 200 mL/hr over 30 Minutes Intravenous Every 24 hours 05/02/18 1526 05/03/18 1026   05/03/18 0330  vancomycin (VANCOCIN) IVPB 750 mg/150 ml premix  Status:  Discontinued     750 mg 150 mL/hr over 60 Minutes Intravenous Every 12 hours 05/02/18 1526 05/09/18 1205   05/02/18 1500  vancomycin (VANCOCIN) IVPB 1000 mg/200 mL premix     1,000 mg 200 mL/hr over 60 Minutes Intravenous NOW 05/02/18 1448 05/02/18 1620   05/02/18 1400  vancomycin (VANCOCIN) IVPB 1000 mg/200 mL premix     1,000 mg 200 mL/hr over 60 Minutes Intravenous  Once 05/02/18 1354 05/02/18 1520   05/02/18 1400  ceFEPIme (MAXIPIME) 2 g in sodium chloride 0.9 % 100 mL IVPB     2  g 200 mL/hr over 30 Minutes Intravenous  Once 05/02/18 1354 05/02/18 1445        Subjective:   Wasyl Dornfeld was seen and examined today.  No acute complaints, overnight had issues with urinary retention, 2 L output after Foley catheter which was changed.  No fevers.  Patient denies any chest pain or shortness of breath.    Objective:   Vitals:   05/10/18 1631 05/10/18 2055 05/11/18 0505 05/11/18 0929  BP: 125/81 115/65 117/72 124/72  Pulse: 89 85 89 82  Resp: 20 20 18 18   Temp: 99.7 F (37.6 C) 99.3 F (37.4 C) 98.7 F (37.1 C) (!) 97.3 F (36.3 C)  TempSrc: Oral Oral Oral Oral  SpO2: 97% 96% 98% 97%  Weight:      Height:        Intake/Output Summary (Last 24 hours) at 05/11/2018 1223 Last data filed at  05/11/2018 0900 Gross per 24 hour  Intake 2019.18 ml  Output 2350 ml  Net -330.82 ml     Wt Readings from Last 3 Encounters:  05/09/18 90.8 kg  04/03/18 101.3 kg  03/02/18 99.8 kg     Physical Exam  General: Alert and awake, dysarthria,  Eyes:   HEENT:    Cardiovascular: S1 S2 clear, RRR No pedal edema b/l  Respiratory: Clear to auscultation bilaterally, no wheezing, rales or rhonchi  Gastrointestinal: Distended but soft, nontender, +BS   Ext: no pedal edema bilaterally  Neuro: no new deficits  Musculoskeletal: No digital cyanosis, clubbing  Skin: No rashes  Psych: normal affect      Data Reviewed:  I have personally reviewed following labs and imaging studies  Micro Results Recent Results (from the past 240 hour(s))  Blood Culture (routine x 2)     Status: Abnormal   Collection Time: 05/02/18  1:46 PM  Result Value Ref Range Status   Specimen Description BLOOD RIGHT ANTECUBITAL  Final   Special Requests   Final    BOTTLES DRAWN AEROBIC AND ANAEROBIC Blood Culture adequate volume   Culture  Setup Time   Final    GRAM POSITIVE COCCI IN CLUSTERS IN BOTH AEROBIC AND ANAEROBIC BOTTLES CRITICAL VALUE NOTED.  VALUE IS CONSISTENT WITH  PREVIOUSLY REPORTED AND CALLED VALUE. Performed at [facility], [place] [address] [place], [place] [address]    Culture STAPHYLOCOCCUS AUREUS (A)  Final   Report Status 05/05/2018 FINAL  Final  Blood Culture (routine x 2)     Status: Abnormal   Collection Time: 05/02/18  2:03 PM  Result Value Ref Range Status   Specimen Description BLOOD LEFT ANTECUBITAL  Final   Special Requests   Final    BOTTLES DRAWN AEROBIC AND ANAEROBIC Blood Culture adequate volume   Culture  Setup Time   Final    GRAM POSITIVE COCCI IN CLUSTERS IN BOTH AEROBIC AND ANAEROBIC BOTTLES CRITICAL RESULT CALLED TO, READ BACK BY AND VERIFIED WITH: Jene Every PharmD 10:05 05/03/18 ([email or web address]) Performed at [facility], [place] [address] [place], [place] [address]    Culture METHICILLIN RESISTANT STAPHYLOCOCCUS AUREUS (A)  Final   Report Status 05/05/2018 FINAL  Final   Organism ID, Bacteria METHICILLIN RESISTANT STAPHYLOCOCCUS AUREUS  Final      Susceptibility   Methicillin resistant staphylococcus aureus - MIC*    CIPROFLOXACIN >=8 RESISTANT Resistant     ERYTHROMYCIN >=8 RESISTANT Resistant     GENTAMICIN <=0.5 SENSITIVE Sensitive     OXACILLIN >=4 RESISTANT Resistant     TETRACYCLINE <=1 SENSITIVE Sensitive     VANCOMYCIN 1 SENSITIVE Sensitive     TRIMETH/SULFA >=320 RESISTANT Resistant     CLINDAMYCIN <=0.25 SENSITIVE Sensitive     RIFAMPIN <=0.5 SENSITIVE Sensitive     Inducible Clindamycin NEGATIVE Sensitive     * METHICILLIN RESISTANT STAPHYLOCOCCUS AUREUS  Blood Culture ID Panel (Reflexed)     Status: Abnormal   Collection Time: 05/02/18  2:03 PM  Result Value Ref Range Status   Enterococcus species NOT DETECTED NOT DETECTED Final   Listeria monocytogenes NOT DETECTED NOT DETECTED Final   Staphylococcus species DETECTED (A) NOT DETECTED Final    Comment: CRITICAL RESULT CALLED TO, READ BACK BY AND VERIFIED WITH: Jene Every PharmD 10:05 05/03/18 ([email or web address])    Staphylococcus aureus (BCID) DETECTED (A)  NOT DETECTED Final    Comment: Methicillin (oxacillin)-resistant Staphylococcus aureus (MRSA). MRSA is predictably resistant to beta-lactam antibiotics (  except ceftaroline). Preferred therapy is vancomycin unless clinically contraindicated. Patient requires contact precautions if  hospitalized. CRITICAL RESULT CALLED TO, READ BACK BY AND VERIFIED WITH: Jene Every PharmD 10:05 05/03/18 ([email or web address])    Methicillin resistance DETECTED (A) NOT DETECTED Final    Comment: CRITICAL RESULT CALLED TO, READ BACK BY AND VERIFIED WITH: Jene Every PharmD 10:05 05/03/18 ([email or web address])    Streptococcus species NOT DETECTED NOT DETECTED Final   Streptococcus agalactiae NOT DETECTED NOT DETECTED Final   Streptococcus pneumoniae NOT DETECTED NOT DETECTED Final   Streptococcus pyogenes NOT DETECTED NOT DETECTED Final   Acinetobacter baumannii NOT DETECTED NOT DETECTED Final   Enterobacteriaceae species NOT DETECTED NOT DETECTED Final   Enterobacter cloacae complex NOT DETECTED NOT DETECTED Final   Escherichia coli NOT DETECTED NOT DETECTED Final   Klebsiella oxytoca NOT DETECTED NOT DETECTED Final   Klebsiella pneumoniae NOT DETECTED NOT DETECTED Final   Proteus species NOT DETECTED NOT DETECTED Final   Serratia marcescens NOT DETECTED NOT DETECTED Final   Haemophilus influenzae NOT DETECTED NOT DETECTED Final   Neisseria meningitidis NOT DETECTED NOT DETECTED Final   Pseudomonas aeruginosa NOT DETECTED NOT DETECTED Final   Candida albicans NOT DETECTED NOT DETECTED Final   Candida glabrata NOT DETECTED NOT DETECTED Final   Candida krusei NOT DETECTED NOT DETECTED Final   Candida parapsilosis NOT DETECTED NOT DETECTED Final   Candida tropicalis NOT DETECTED NOT DETECTED Final    Comment: Performed at [facility], [place]. [address] [place], [place] [address]  MRSA PCR Screening     Status: Abnormal   Collection Time: 05/03/18  6:12 AM  Result Value Ref Range Status   MRSA by PCR POSITIVE (A) NEGATIVE Final     Comment:        The GeneXpert MRSA Assay (FDA approved for NASAL specimens only), is one component of a comprehensive MRSA colonization surveillance program. It is not intended to diagnose MRSA infection nor to guide or monitor treatment for MRSA infections. RESULT CALLED TO, READ BACK BY AND VERIFIED WITH: Precious Bard RN 9:40 05/03/18 ([email or web address]) Performed at [facility], [place] [address] [place], [place] [address]   Culture, blood (routine x 2)     Status: None   Collection Time: 05/03/18 10:01 PM  Result Value Ref Range Status   Specimen Description BLOOD RIGHT HAND  Final   Special Requests   Final    BOTTLES DRAWN AEROBIC AND ANAEROBIC Blood Culture adequate volume   Culture   Final    NO GROWTH 5 DAYS Performed at [facility], [place] [address] [place], [place] [address]    Report Status 05/08/2018 FINAL  Final  Culture, blood (routine x 2)     Status: None   Collection Time: 05/03/18 10:09 PM  Result Value Ref Range Status   Specimen Description BLOOD LEFT HAND  Final   Special Requests   Final    BOTTLES DRAWN AEROBIC ONLY Blood Culture adequate volume   Culture   Final    NO GROWTH 5 DAYS Performed at [facility], [place] [address] [place], [place] [address]    Report Status 05/08/2018 FINAL  Final  Culture, blood (routine x 2)     Status: None (Preliminary result)   Collection Time: 05/09/18  8:10 PM  Result Value Ref Range Status   Specimen Description BLOOD LEFT ARM  Final   Special Requests   Final    BOTTLES DRAWN AEROBIC ONLY Blood Culture  results may not be optimal due to an inadequate volume of blood received in culture bottles   Culture   Final    NO GROWTH < 24 HOURS Performed at [facility] [place] [address] [place], [place] [address]    Report Status PENDING  Incomplete  Culture, blood (routine x 2)     Status: None (Preliminary result)   Collection Time: 05/09/18  8:20 PM  Result Value Ref Range Status   Specimen Description  BLOOD BLOOD LEFT FOREARM  Final   Special Requests   Final    BOTTLES DRAWN AEROBIC ONLY Blood Culture results may not be optimal due to an inadequate volume of blood received in culture bottles   Culture   Final    NO GROWTH < 24 HOURS Performed at [facility], [place] [address], [place], [place] [address]    Report Status PENDING  Incomplete    Radiology Reports Ct Abdomen Pelvis Wo Contrast  Result Date: 05/02/2018 CLINICAL DATA:  Abdominal distention EXAM: CT ABDOMEN AND PELVIS WITHOUT CONTRAST TECHNIQUE: Multidetector CT imaging of the abdomen and pelvis was performed following the standard protocol without IV contrast. COMPARISON:  Same day radiographs of the abdomen and pelvis FINDINGS: Lower chest: Top-normal heart size. Trace pleural effusions with bibasilar atelectasis. Hepatobiliary: No focal liver abnormality is seen. No gallstones, gallbladder wall thickening, or biliary dilatation. Pancreas: Unremarkable. No pancreatic ductal dilatation or surrounding inflammatory changes. Spleen: Normal in size without focal abnormality. Adrenals/Urinary Tract: Normal bilateral adrenal glands. No nephrolithiasis. Motion artifacts limit assessment at the level of the kidneys however there does appear to be a hypodense cystic lesion of the interpolar left kidney measuring up to 6 cm. No nephrolithiasis nor hydroureteronephrosis. Foley decompressed urinary bladder. Stomach/Bowel: Small hiatal hernia. Decompressed stomach and small bowel loops. Normal small bowel rotation. Moderate gaseous distention of the colon up to 9 cm at the level of the cecum with liquid stool noted within from cecum to distal descending colon likely representing large bowel dysmotility or ileus. Circumferential thickening surrounding a small stool ball is noted within the rectum. Mild stercoral proctitis is not excluded. Vascular/Lymphatic: IVC filter in place. Atherosclerosis of the aorta without aneurysm. No adenopathy.  Reproductive: Normal size prostate. Other: Soft tissue anasarca. No free air or free fluid. Musculoskeletal: Status post ventral hernia repair. Small amount of fat in the right inguinal canal. IMPRESSION: 1. Moderate gaseous distention of the colon up to 9 cm at the level of the cecum with liquid stool noted within from cecum to distal descending colon. Findings may represent a large bowel dysmotility or ileus. Mechanical source of obstruction is identified. 2. Circumferential thickening surrounding a small stool ball within the rectum. Mild stercoral proctitis could have this appearance. 3. 6 cm hypodense cystic lesion of the interpolar left kidney, likely a simple cyst however further characterization is limited due to motion. 4. IVC filter in place. 5. Mild soft tissue anasarca. Electronically Signed   By: Ashley Royalty M.D.   On: 05/02/2018 16:46   Dg Abdomen 1 View  Result Date: 05/02/2018 CLINICAL DATA:  67 y/o  M; abdominal distention. EXAM: ABDOMEN - 1 VIEW COMPARISON:  05/02/2018 CT abdomen and pelvis. FINDINGS: Diffuse air-filled distention of the large bowel. Upper abdomen is excluded from the field of view. IVC filter in situ. Advanced degenerative changes of the visible lumbar spine. Lower abdominal wall mesh hernia repair anchors noted. IMPRESSION: Diffuse air-filled distention of the large bowel, possibly chronic large bowel dysmotility  or ileus. Electronically Signed   By: Kristine Garbe M.D.   On: 05/02/2018 20:39   Dg Esophagus  Result Date: 05/09/2018 CLINICAL DATA:  Dysphagia EXAM: ESOPHOGRAM/BARIUM SWALLOW TECHNIQUE: Single contrast examination was performed using  thin barium. FLUOROSCOPY TIME:  Fluoroscopy Time:  2 minutes 24 seconds Radiation Exposure Index (if provided by the fluoroscopic device): Number of Acquired Spot Images: 0 COMPARISON:  None. FINDINGS: Limited study due to patient's condition. There are no fixed strictures visualized. No reflux noted during the  study. The patient had difficulty swallowing the 13 mm barium tablet which remains present in the back of the throat for several swallows, finally passing into the esophagus. This sticks in the mid to distal esophagus despite the lack of visible stricture in this area. IMPRESSION: No visible stricture seen, but the 13 mm barium tablet sticks in the mid to distal esophagus. May consider further evaluation with endoscopy. Electronically Signed   By: Rolm Baptise M.D.   On: 05/09/2018 10:37   Dg Chest Port 1 View  Result Date: 05/02/2018 CLINICAL DATA:  Fever EXAM: PORTABLE CHEST 1 VIEW COMPARISON:  03/03/2018 FINDINGS: The heart size and mediastinal contours are within normal limits. Both lungs are clear. The visualized skeletal structures are unremarkable. IMPRESSION: No active disease. Electronically Signed   By: Franchot Gallo M.D.   On: 05/02/2018 14:35   Dg Abd 2 Views  Result Date: 05/09/2018 CLINICAL DATA:  Abdominal distention. EXAM: ABDOMEN - 2 VIEW COMPARISON:  05/04/2018, 05/02/2018.  CT 05/02/2018. FINDINGS: IVC filter noted tilted over the upper portion of the inferior vena cava in unchanged position. Distended loops of colon again noted. Colon is slightly less distended on today's exam. Several nonspecific air-filled loops of small bowel noted. No free air is identified. Degenerative changes lumbar spine. IMPRESSION: Dilated loops of colon again noted. Colon is slightly less distended on today's exam. Electronically Signed   By: Marcello Moores  Register   On: 05/09/2018 09:58   Dg Abd Portable 1v  Result Date: 05/04/2018 CLINICAL DATA:  Follow-up ileus EXAM: PORTABLE ABDOMEN - 1 VIEW COMPARISON:  05/02/2018 FINDINGS: Distended colon is again noted and stable consistent with generalized ileus. No definitive obstructive changes are seen. Postsurgical changes in the low pelvis are noted. Degenerative changes of the lumbar spine are seen. No free air is noted. IMPRESSION: Stable gaseous dilatation of  the colon consistent with ileus. Electronically Signed   By: Inez Catalina M.D.   On: 05/04/2018 09:40    Lab Data:  CBC: Recent Labs  Lab 05/07/18 0403 05/08/18 0522 05/09/18 0370 05/10/18 0642 05/11/18 0615  WBC 15.2* 19.0* 19.2* 23.1* 17.1*  HGB 8.4* 9.2* 9.0* 8.6* 7.4*  HCT 26.7* 29.3* 29.4* 28.2* 23.1*  MCV 89.0 88.3 89.6 89.2 88.2  PLT 755* 899* 849* 915* 488*   Basic Metabolic Panel: Recent Labs  Lab 05/08/18 0522 05/09/18 0644 05/09/18 1041 05/10/18 0642 05/11/18 0615  NA 137 135 135 133* 133*  K 4.0 4.7 4.9 4.7 3.2*  CL 110 109 110 109 104  CO2 21* 20* 18* 17* 20*  GLUCOSE 122* 124* 112* 103* 107*  BUN <5* 11 12 19  26*  CREATININE 0.84 1.74* 1.94* 2.75* 3.44*  CALCIUM 8.3* 8.6* 8.4* 8.5* 7.8*  MG  --   --   --  1.9  --    GFR: Estimated Creatinine Clearance: 22.9 mL/min (A) (by C-G formula based on SCr of 3.44 mg/dL (H)). Liver Function Tests: No results for input(s): AST, ALT, ALKPHOS, BILITOT,  PROT, ALBUMIN in the last 168 hours. No results for input(s): LIPASE, AMYLASE in the last 168 hours. No results for input(s): AMMONIA in the last 168 hours. Coagulation Profile: No results for input(s): INR, PROTIME in the last 168 hours. Cardiac Enzymes: Recent Labs  Lab 05/11/18 0615  CKTOTAL 26*   BNP (last 3 results) No results for input(s): PROBNP in the last 8760 hours. HbA1C: No results for input(s): HGBA1C in the last 72 hours. CBG: No results for input(s): GLUCAP in the last 168 hours. Lipid Profile: No results for input(s): CHOL, HDL, LDLCALC, TRIG, CHOLHDL, LDLDIRECT in the last 72 hours. Thyroid Function Tests: No results for input(s): TSH, T4TOTAL, FREET4, T3FREE, THYROIDAB in the last 72 hours. Anemia Panel: No results for input(s): VITAMINB12, FOLATE, FERRITIN, TIBC, IRON, RETICCTPCT in the last 72 hours. Urine analysis:    Component Value Date/Time   COLORURINE YELLOW 05/06/2018 1609   APPEARANCEUR TURBID (A) 05/06/2018 1609   LABSPEC  1.012 05/06/2018 1609   PHURINE 5.0 05/06/2018 1609   GLUCOSEU NEGATIVE 05/06/2018 1609   HGBUR NEGATIVE 05/06/2018 1609   BILIRUBINUR NEGATIVE 05/06/2018 [place] 05/06/2018 1609   PROTEINUR NEGATIVE 05/06/2018 1609   UROBILINOGEN 0.2 05/08/2012 0314   NITRITE NEGATIVE 05/06/2018 1609   LEUKOCYTESUR NEGATIVE 05/06/2018 1609     Ripudeep Rai M.D. [facility] 05/11/2018, 12:23 PM  Pager: [phone] Between 7am to 7pm - call Pager - [phone]  After 7pm go to [email or web address] - password [email or web address]  Call night coverage person covering after 7pm

## 2018-05-12 LAB — CBC
HCT: 24.6 % — ABNORMAL LOW (ref 39.0–52.0)
HEMOGLOBIN: 7.6 g/dL — AB (ref 13.0–17.0)
MCH: 27 pg (ref 26.0–34.0)
MCHC: 30.9 g/dL (ref 30.0–36.0)
MCV: 87.5 fL (ref 80.0–100.0)
NRBC: 0 % (ref 0.0–0.2)
Platelets: 779 10*3/uL — ABNORMAL HIGH (ref 150–400)
RBC: 2.81 MIL/uL — ABNORMAL LOW (ref 4.22–5.81)
RDW: 15 % (ref 11.5–15.5)
WBC: 12.9 10*3/uL — AB (ref 4.0–10.5)

## 2018-05-12 LAB — BASIC METABOLIC PANEL
ANION GAP: 9 (ref 5–15)
BUN: 26 mg/dL — AB (ref 8–23)
CALCIUM: 7.8 mg/dL — AB (ref 8.9–10.3)
CO2: 22 mmol/L (ref 22–32)
Chloride: 105 mmol/L (ref 98–111)
Creatinine, Ser: 2.96 mg/dL — ABNORMAL HIGH (ref 0.61–1.24)
GFR calc Af Amer: 24 mL/min — ABNORMAL LOW (ref >60)
GFR, EST NON AFRICAN AMERICAN: 20 mL/min — AB (ref >60)
Glucose, Bld: 95 mg/dL (ref 70–99)
Potassium: 3.1 mmol/L — ABNORMAL LOW (ref 3.5–5.1)
SODIUM: 136 mmol/L (ref 135–145)

## 2018-05-12 LAB — APTT: aPTT: 51 seconds — ABNORMAL HIGH (ref 24–36)

## 2018-05-12 MED ORDER — POTASSIUM CHLORIDE CRYS ER 20 MEQ PO TBCR
40.0000 meq | EXTENDED_RELEASE_TABLET | Freq: Once | ORAL | Status: AC
  Administered 2018-05-12: 40 meq via ORAL
  Filled 2018-05-12: qty 2

## 2018-05-12 NOTE — Progress Notes (Signed)
[facility]                                                                              Patient Demographics  Wayne Buckley, is a 67 y.o. male, DOB - January 24, 1951, MEQ:[id number]  Admit date - 05/02/2018   Admitting Physician Shela Leff, MD  Outpatient Primary MD for the patient is [name], MD  Outpatient specialists:   LOS - 10  days   Medical records reviewed and are as summarized below:    Chief Complaint  Patient presents with  . Fever       Brief summary    Wayne Buckley is a 67 y.o. malewith medical history significant oftraumatic brain injury, central cord syndrome, neurogenic bladder with indwelling Foley catheter, bilateral leg weakness, prior DVT, hypertension, alcohol abuse. He presented secondary to fever. He was found to have MRSA in his urine prior to arrival. Blood cultures positive for MRSA and CT abdomen concerning for ileus.  Assessment & Plan    Principal Problem: Sepsis -Patient met criteria for sepsis at the time of admission, source is secondary to UTI and MRSA bacteremia  Active problems MRSA bacteremia, MRSA UTI -Patient empirically treated with vancomycin and cefepime. -Repeat blood cultures 11/15 showed no growth, leukocytosis still trending up -2D echo showed no evidence of vegetations - ID following, initially was placed on IV vancomycin, now changed to daptomycin.   - TEE was unsuccessful due to ?obstruction, esophagogram showed no stricture -Leukocytosis improving, 12.9 today  Acute kidney injury with metabolic acidosis  -Vancomycin discontinued, placed on IV fluids with bicarb, likely worsened due to acute urinary retention -Renal ultrasound did not show any obstruction or hydronephrosis.  Creatinine improving, 2.9 today  Ileus/abdominal distention -Patient has been followed by general surgery and gastroenterology, differentials including Ogilvie's syndrome, mechanical component - Esophagogram results  discussed with GI, Dr. [name].  Per GI, patient will not be able to tolerate EGD with multiple comorbidities currently and no obvious stricture on the esophagram. -Regarding ileus, recommended enemas, advance diet, increase mobility, sit upright.  Patient is not a candidate for neostigmine or colonoscopy decompression.  Per GI, this is commonly seen in patients with TBI and cord compression and will spontaneously resolve. -Abdomen feels less distended today, continue full liquid diet, continue bowel regimen, enema as needed  Urinary retention with neurogenic bladder -Per admission history patient with indwelling Foley catheter secondary to neurogenic bladder, TBI and cord compression - follow outpatient with urology and outpatient voiding trial, patient was supposed to have appointment today on 11/20.  Foley catheter changed.  Anemia, likely chronic H&H stable  Leukocytosis Worsening, likely due to #1 and #2, follow closely, blood cultures been negative so far, afebrile  History of DVT, left lower extremity, diagnosed in 02/2018 Continue Eliquis  History of TBI -Reviewed recommendations from inpatient rehab, recommended skilled nursing facility discussed with patient's wife  Essential hypertension BP stable, continue amlodipine and Coreg  History of alcohol use Currently stable, no acute issues  Pressure injury POA, bilateral buttocks, deep tissue injury Wound care per nursing  Thrombocytosis -Likely reactive from sepsis, bacteremia, platelet count improving  Code Status:  Full CODE STATUS DVT Prophylaxis: Apixaban Family Communication: Discussed in detail with the patient, all imaging results, lab results explained to the patient.     Disposition Plan:, Plan for skilled nursing facility in next 24 to 48 hours  Time Spent in minutes 25 minutes  Procedures:  2D echo EF 60 to 65%, no vegetations  Consultants:   GI Infectious disease General  surgery Nephrology  Antimicrobials:      Medications  Scheduled Meds: . amLODipine  10 mg Oral Daily  . apixaban  5 mg Oral BID  . bisacodyl  10 mg Rectal Daily  . carvedilol  12.5 mg Oral BID WC  . collagenase   Topical Daily  . liver oil-zinc oxide   Topical QID  . polyethylene glycol  17 g Oral BID  . senna-docusate  1 tablet Oral BID   Continuous Infusions: . DAPTOmycin (CUBICIN)  IV 730 mg (05/10/18 2056)  .  sodium bicarbonate  infusion 1000 mL 100 mL/hr at 05/11/18 2334   PRN Meds:.acetaminophen, hydrALAZINE   Antibiotics   Anti-infectives (From admission, onward)   Start     Dose/Rate Route Frequency Ordered Stop   05/10/18 2000  DAPTOmycin (CUBICIN) 730 mg in sodium chloride 0.9 % IVPB     730 mg 229.2 mL/hr over 30 Minutes Intravenous Every 48 hours 05/10/18 1414     05/09/18 1205  vancomycin variable dose per unstable renal function (pharmacist dosing)  Status:  Discontinued      Does not apply See admin instructions 05/09/18 1205 05/09/18 1824   05/03/18 1400  ceFEPIme (MAXIPIME) 2 g in sodium chloride 0.9 % 100 mL IVPB  Status:  Discontinued     2 g 200 mL/hr over 30 Minutes Intravenous Every 24 hours 05/02/18 1526 05/03/18 1026   05/03/18 0330  vancomycin (VANCOCIN) IVPB 750 mg/150 ml premix  Status:  Discontinued     750 mg 150 mL/hr over 60 Minutes Intravenous Every 12 hours 05/02/18 1526 05/09/18 1205   05/02/18 1500  vancomycin (VANCOCIN) IVPB 1000 mg/200 mL premix     1,000 mg 200 mL/hr over 60 Minutes Intravenous NOW 05/02/18 1448 05/02/18 1620   05/02/18 1400  vancomycin (VANCOCIN) IVPB 1000 mg/200 mL premix     1,000 mg 200 mL/hr over 60 Minutes Intravenous  Once 05/02/18 1354 05/02/18 1520   05/02/18 1400  ceFEPIme (MAXIPIME) 2 g in sodium chloride 0.9 % 100 mL IVPB     2 g 200 mL/hr over 30 Minutes Intravenous  Once 05/02/18 1354 05/02/18 1445        Subjective:   Harrie Cazarez was seen and examined today.  Feeling better, no specific  complaints.   No fevers.  Patient denies any chest pain or shortness of breath.    Objective:   Vitals:   05/11/18 1825 05/11/18 2024 05/12/18 0421 05/12/18 0928  BP: (!) 125/92 (!) 138/126 135/73 123/78  Pulse: 89 90  86  Resp: _0 Temp: 100.2 F (37.9 C) (!) 100.9 F (38.3 C) 98 F (36.7 C) 99 F (37.2 C)  TempSrc: Oral Oral Oral Oral  SpO2: 96% 92%  96%  Weight:      Height:        Intake/Output Summary (Last 24 hours) at 05/12/2018 1130 Last data filed at 05/12/2018 0900 Gross per 24 hour  Intake 1560 ml  Output -  Net 1560 ml     Wt Readings from Last 3 Encounters:  05/09/18 90.8 kg  04/03/18  101.3 kg  03/02/18 99.8 kg      Physical Exam  General: Alert and oriented x 3, NAD  Eyes:  HEENT:    Cardiovascular: S1 S2clear Regular rate and rhythm. No pedal edema b/l  Respiratory: Clear to auscultation bilaterally, no wheezing, rales or rhonchi  Gastrointestinal: Soft, distended but not tender  Ext: no pedal edema bilaterally  Neuro: No new deficits  Musculoskeletal: No digital cyanosis, clubbing  Skin: No rashes  Psych: Normal affect and demeanor, alert and oriented x3       Data Reviewed:  I have personally reviewed following labs and imaging studies  Micro Results Recent Results (from the past 240 hour(s))  Blood Culture (routine x 2)     Status: Abnormal   Collection Time: 05/02/18  1:46 PM  Result Value Ref Range Status   Specimen Description BLOOD RIGHT ANTECUBITAL  Final   Special Requests   Final    BOTTLES DRAWN AEROBIC AND ANAEROBIC Blood Culture adequate volume   Culture  Setup Time   Final    GRAM POSITIVE COCCI IN CLUSTERS IN BOTH AEROBIC AND ANAEROBIC BOTTLES CRITICAL VALUE NOTED.  VALUE IS CONSISTENT WITH PREVIOUSLY REPORTED AND CALLED VALUE. Performed at [facility], [place] [address] [place], [place] [address]    Culture STAPHYLOCOCCUS AUREUS (A)  Final   Report Status 05/05/2018 FINAL  Final  Blood  Culture (routine x 2)     Status: Abnormal   Collection Time: 05/02/18  2:03 PM  Result Value Ref Range Status   Specimen Description BLOOD LEFT ANTECUBITAL  Final   Special Requests   Final    BOTTLES DRAWN AEROBIC AND ANAEROBIC Blood Culture adequate volume   Culture  Setup Time   Final    GRAM POSITIVE COCCI IN CLUSTERS IN BOTH AEROBIC AND ANAEROBIC BOTTLES CRITICAL RESULT CALLED TO, READ BACK BY AND VERIFIED WITH: Jene Every PharmD 10:05 05/03/18 ([email or web address]) Performed at [facility], [place] [address] [place], [place] [address]    Culture METHICILLIN RESISTANT STAPHYLOCOCCUS AUREUS (A)  Final   Report Status 05/05/2018 FINAL  Final   Organism ID, Bacteria METHICILLIN RESISTANT STAPHYLOCOCCUS AUREUS  Final      Susceptibility   Methicillin resistant staphylococcus aureus - MIC*    CIPROFLOXACIN >=8 RESISTANT Resistant     ERYTHROMYCIN >=8 RESISTANT Resistant     GENTAMICIN <=0.5 SENSITIVE Sensitive     OXACILLIN >=4 RESISTANT Resistant     TETRACYCLINE <=1 SENSITIVE Sensitive     VANCOMYCIN 1 SENSITIVE Sensitive     TRIMETH/SULFA >=320 RESISTANT Resistant     CLINDAMYCIN <=0.25 SENSITIVE Sensitive     RIFAMPIN <=0.5 SENSITIVE Sensitive     Inducible Clindamycin NEGATIVE Sensitive     * METHICILLIN RESISTANT STAPHYLOCOCCUS AUREUS  Blood Culture ID Panel (Reflexed)     Status: Abnormal   Collection Time: 05/02/18  2:03 PM  Result Value Ref Range Status   Enterococcus species NOT DETECTED NOT DETECTED Final   Listeria monocytogenes NOT DETECTED NOT DETECTED Final   Staphylococcus species DETECTED (A) NOT DETECTED Final    Comment: CRITICAL RESULT CALLED TO, READ BACK BY AND VERIFIED WITH: Jene Every PharmD 10:05 05/03/18 ([email or web address])    Staphylococcus aureus (BCID) DETECTED (A) NOT DETECTED Final    Comment: Methicillin (oxacillin)-resistant Staphylococcus aureus (MRSA). MRSA is predictably resistant to beta-lactam antibiotics (except ceftaroline). Preferred therapy is vancomycin  unless clinically contraindicated. Patient requires contact precautions if  hospitalized. CRITICAL RESULT CALLED TO, READ BACK BY AND  VERIFIED WITH: Jene Every PharmD 10:05 05/03/18 ([email or web address])    Methicillin resistance DETECTED (A) NOT DETECTED Final    Comment: CRITICAL RESULT CALLED TO, READ BACK BY AND VERIFIED WITH: Jene Every PharmD 10:05 05/03/18 ([email or web address])    Streptococcus species NOT DETECTED NOT DETECTED Final   Streptococcus agalactiae NOT DETECTED NOT DETECTED Final   Streptococcus pneumoniae NOT DETECTED NOT DETECTED Final   Streptococcus pyogenes NOT DETECTED NOT DETECTED Final   Acinetobacter baumannii NOT DETECTED NOT DETECTED Final   Enterobacteriaceae species NOT DETECTED NOT DETECTED Final   Enterobacter cloacae complex NOT DETECTED NOT DETECTED Final   Escherichia coli NOT DETECTED NOT DETECTED Final   Klebsiella oxytoca NOT DETECTED NOT DETECTED Final   Klebsiella pneumoniae NOT DETECTED NOT DETECTED Final   Proteus species NOT DETECTED NOT DETECTED Final   Serratia marcescens NOT DETECTED NOT DETECTED Final   Haemophilus influenzae NOT DETECTED NOT DETECTED Final   Neisseria meningitidis NOT DETECTED NOT DETECTED Final   Pseudomonas aeruginosa NOT DETECTED NOT DETECTED Final   Candida albicans NOT DETECTED NOT DETECTED Final   Candida glabrata NOT DETECTED NOT DETECTED Final   Candida krusei NOT DETECTED NOT DETECTED Final   Candida parapsilosis NOT DETECTED NOT DETECTED Final   Candida tropicalis NOT DETECTED NOT DETECTED Final    Comment: Performed at [facility], [place] [address] [place], [place] [address]  MRSA PCR Screening     Status: Abnormal   Collection Time: 05/03/18  6:12 AM  Result Value Ref Range Status   MRSA by PCR POSITIVE (A) NEGATIVE Final    Comment:        The GeneXpert MRSA Assay (FDA approved for NASAL specimens only), is one component of a comprehensive MRSA colonization surveillance program. It is not intended to diagnose  MRSA infection nor to guide or monitor treatment for MRSA infections. RESULT CALLED TO, READ BACK BY AND VERIFIED WITH: Precious Bard RN 9:40 05/03/18 ([email or web address]) Performed at [facility], [place] [address], [place], [place] [address]   Culture, blood (routine x 2)     Status: None   Collection Time: 05/03/18 10:01 PM  Result Value Ref Range Status   Specimen Description BLOOD RIGHT HAND  Final   Special Requests   Final    BOTTLES DRAWN AEROBIC AND ANAEROBIC Blood Culture adequate volume   Culture   Final    NO GROWTH 5 DAYS Performed at [facility], [place] [address], [place], [place] [address]    Report Status 05/08/2018 FINAL  Final  Culture, blood (routine x 2)     Status: None   Collection Time: 05/03/18 10:09 PM  Result Value Ref Range Status   Specimen Description BLOOD LEFT HAND  Final   Special Requests   Final    BOTTLES DRAWN AEROBIC ONLY Blood Culture adequate volume   Culture   Final    NO GROWTH 5 DAYS Performed at [facility], [place] [address] [place], [place] [address]    Report Status 05/08/2018 FINAL  Final  Culture, blood (routine x 2)     Status: None (Preliminary result)   Collection Time: 05/09/18  8:10 PM  Result Value Ref Range Status   Specimen Description BLOOD LEFT ARM  Final   Special Requests   Final    BOTTLES DRAWN AEROBIC ONLY Blood Culture results may not be optimal due to an inadequate volume of blood received in culture bottles   Culture   Final  NO GROWTH 2 DAYS Performed at [facility], [place] [address] [place], [place] [address]    Report Status PENDING  Incomplete  Culture, blood (routine x 2)     Status: None (Preliminary result)   Collection Time: 05/09/18  8:20 PM  Result Value Ref Range Status   Specimen Description BLOOD BLOOD LEFT FOREARM  Final   Special Requests   Final    BOTTLES DRAWN AEROBIC ONLY Blood Culture results may not be optimal due to an inadequate volume of blood received in culture bottles    Culture   Final    NO GROWTH 2 DAYS Performed at [facility], [place] [address], [place], [place] [address]    Report Status PENDING  Incomplete    Radiology Reports Ct Abdomen Pelvis Wo Contrast  Result Date: 05/02/2018 CLINICAL DATA:  Abdominal distention EXAM: CT ABDOMEN AND PELVIS WITHOUT CONTRAST TECHNIQUE: Multidetector CT imaging of the abdomen and pelvis was performed following the standard protocol without IV contrast. COMPARISON:  Same day radiographs of the abdomen and pelvis FINDINGS: Lower chest: Top-normal heart size. Trace pleural effusions with bibasilar atelectasis. Hepatobiliary: No focal liver abnormality is seen. No gallstones, gallbladder wall thickening, or biliary dilatation. Pancreas: Unremarkable. No pancreatic ductal dilatation or surrounding inflammatory changes. Spleen: Normal in size without focal abnormality. Adrenals/Urinary Tract: Normal bilateral adrenal glands. No nephrolithiasis. Motion artifacts limit assessment at the level of the kidneys however there does appear to be a hypodense cystic lesion of the interpolar left kidney measuring up to 6 cm. No nephrolithiasis nor hydroureteronephrosis. Foley decompressed urinary bladder. Stomach/Bowel: Small hiatal hernia. Decompressed stomach and small bowel loops. Normal small bowel rotation. Moderate gaseous distention of the colon up to 9 cm at the level of the cecum with liquid stool noted within from cecum to distal descending colon likely representing large bowel dysmotility or ileus. Circumferential thickening surrounding a small stool ball is noted within the rectum. Mild stercoral proctitis is not excluded. Vascular/Lymphatic: IVC filter in place. Atherosclerosis of the aorta without aneurysm. No adenopathy. Reproductive: Normal size prostate. Other: Soft tissue anasarca. No free air or free fluid. Musculoskeletal: Status post ventral hernia repair. Small amount of fat in the right inguinal canal. IMPRESSION: 1.  Moderate gaseous distention of the colon up to 9 cm at the level of the cecum with liquid stool noted within from cecum to distal descending colon. Findings may represent a large bowel dysmotility or ileus. Mechanical source of obstruction is identified. 2. Circumferential thickening surrounding a small stool ball within the rectum. Mild stercoral proctitis could have this appearance. 3. 6 cm hypodense cystic lesion of the interpolar left kidney, likely a simple cyst however further characterization is limited due to motion. 4. IVC filter in place. 5. Mild soft tissue anasarca. Electronically Signed   By: Ashley Royalty M.D.   On: 05/02/2018 16:46   Dg Abdomen 1 View  Result Date: 05/02/2018 CLINICAL DATA:  67 y/o  M; abdominal distention. EXAM: ABDOMEN - 1 VIEW COMPARISON:  05/02/2018 CT abdomen and pelvis. FINDINGS: Diffuse air-filled distention of the large bowel. Upper abdomen is excluded from the field of view. IVC filter in situ. Advanced degenerative changes of the visible lumbar spine. Lower abdominal wall mesh hernia repair anchors noted. IMPRESSION: Diffuse air-filled distention of the large bowel, possibly chronic large bowel dysmotility or ileus. Electronically Signed   By: Kristine Garbe M.D.   On: 05/02/2018 20:39   Dg Esophagus  Result Date: 05/09/2018 CLINICAL DATA:  Dysphagia EXAM: ESOPHOGRAM/BARIUM SWALLOW TECHNIQUE: Single contrast examination was performed using  thin barium. FLUOROSCOPY TIME:  Fluoroscopy Time:  2 minutes 24 seconds Radiation Exposure Index (if provided by the fluoroscopic device): Number of Acquired Spot Images: 0 COMPARISON:  None. FINDINGS: Limited study due to patient's condition. There are no fixed strictures visualized. No reflux noted during the study. The patient had difficulty swallowing the 13 mm barium tablet which remains present in the back of the throat for several swallows, finally passing into the esophagus. This sticks in the mid to distal  esophagus despite the lack of visible stricture in this area. IMPRESSION: No visible stricture seen, but the 13 mm barium tablet sticks in the mid to distal esophagus. May consider further evaluation with endoscopy. Electronically Signed   By: Rolm Baptise M.D.   On: 05/09/2018 10:37   US Renal  Result Date: 05/11/2018 CLINICAL DATA:  Urinary retention EXAM: RENAL / URINARY TRACT ULTRASOUND COMPLETE COMPARISON:  CT of the abdomen and pelvis on 05/02/2018 FINDINGS: Right Kidney: Renal measurements: 16.3 x 6.6 x 7.4 cm = volume: 415 mL . Echogenicity within normal limits. No mass or hydronephrosis visualized. Left Kidney: Renal measurements: 15.4 x 8.1 x 7.7 cm = volume: 500 mL. Echogenicity within normal limits. No mass or hydronephrosis visualized. Simple cyst of the interpolar left kidney measures roughly 4.7 x 5.9 x 3.9 cm. Bladder: The bladder is decompressed by a Foley catheter. IMPRESSION: Unremarkable renal ultrasound. Electronically Signed   By: Aletta Edouard M.D.   On: 05/11/2018 14:35   Dg Chest Port 1 View  Result Date: 05/02/2018 CLINICAL DATA:  Fever EXAM: PORTABLE CHEST 1 VIEW COMPARISON:  03/03/2018 FINDINGS: The heart size and mediastinal contours are within normal limits. Both lungs are clear. The visualized skeletal structures are unremarkable. IMPRESSION: No active disease. Electronically Signed   By: Franchot Gallo M.D.   On: 05/02/2018 14:35   Dg Abd 2 Views  Result Date: 05/09/2018 CLINICAL DATA:  Abdominal distention. EXAM: ABDOMEN - 2 VIEW COMPARISON:  05/04/2018, 05/02/2018.  CT 05/02/2018. FINDINGS: IVC filter noted tilted over the upper portion of the inferior vena cava in unchanged position. Distended loops of colon again noted. Colon is slightly less distended on today's exam. Several nonspecific air-filled loops of small bowel noted. No free air is identified. Degenerative changes lumbar spine. IMPRESSION: Dilated loops of colon again noted. Colon is slightly less  distended on today's exam. Electronically Signed   By: Marcello Moores  Register   On: 05/09/2018 09:58   Dg Abd Portable 1v  Result Date: 05/04/2018 CLINICAL DATA:  Follow-up ileus EXAM: PORTABLE ABDOMEN - 1 VIEW COMPARISON:  05/02/2018 FINDINGS: Distended colon is again noted and stable consistent with generalized ileus. No definitive obstructive changes are seen. Postsurgical changes in the low pelvis are noted. Degenerative changes of the lumbar spine are seen. No free air is noted. IMPRESSION: Stable gaseous dilatation of the colon consistent with ileus. Electronically Signed   By: Inez Catalina M.D.   On: 05/04/2018 09:40    Lab Data:  CBC: Recent Labs  Lab 05/08/18 0522 05/09/18 8756 05/10/18 4332 05/11/18 0615 05/12/18 0628  WBC 19.0* 19.2* 23.1* 17.1* 12.9*  HGB 9.2* 9.0* 8.6* 7.4* 7.6*  HCT 29.3* 29.4* 28.2* 23.1* 24.6*  MCV 88.3 89.6 89.2 88.2 87.5  PLT 899* 849* 915* 701* 951*   Basic Metabolic Panel: Recent Labs  Lab 05/09/18 0644 05/09/18 1041 05/10/18 0642 05/11/18 0615 05/12/18 0628  NA 135 135 133* 133* 136  K 4.7 4.9 4.7 3.2* 3.1*  CL 109 110 109 104 105  CO2 20* 18* 17* 20* 22  GLUCOSE 124* 112* 103* 107* 95  BUN _0 26* 26*  CREATININE 1.74* 1.94* 2.75* 3.44* 2.96*  CALCIUM 8.6* 8.4* 8.5* 7.8* 7.8*  MG  --   --  1.9  --   --    GFR: Estimated Creatinine Clearance: 26.6 mL/min (A) (by C-G formula based on SCr of 2.96 mg/dL (H)). Liver Function Tests: No results for input(s): AST, ALT, ALKPHOS, BILITOT, PROT, ALBUMIN in the last 168 hours. No results for input(s): LIPASE, AMYLASE in the last 168 hours. No results for input(s): AMMONIA in the last 168 hours. Coagulation Profile: No results for input(s): INR, PROTIME in the last 168 hours. Cardiac Enzymes: Recent Labs  Lab 05/11/18 0615  CKTOTAL 26*   BNP (last 3 results) No results for input(s): PROBNP in the last 8760 hours. HbA1C: No results for input(s): HGBA1C in the last 72  hours. CBG: Recent Labs  Lab 05/11/18 2027  GLUCAP 111*   Lipid Profile: No results for input(s): CHOL, HDL, LDLCALC, TRIG, CHOLHDL, LDLDIRECT in the last 72 hours. Thyroid Function Tests: No results for input(s): TSH, T4TOTAL, FREET4, T3FREE, THYROIDAB in the last 72 hours. Anemia Panel: No results for input(s): VITAMINB12, FOLATE, FERRITIN, TIBC, IRON, RETICCTPCT in the last 72 hours. Urine analysis:    Component Value Date/Time   COLORURINE YELLOW 05/06/2018 1609   APPEARANCEUR TURBID (A) 05/06/2018 1609   LABSPEC 1.012 05/06/2018 1609   PHURINE 5.0 05/06/2018 1609   GLUCOSEU NEGATIVE 05/06/2018 1609   HGBUR NEGATIVE 05/06/2018 1609   BILIRUBINUR NEGATIVE 05/06/2018 [facility] 05/06/2018 1609   PROTEINUR NEGATIVE 05/06/2018 1609   UROBILINOGEN 0.2 05/08/2012 0314   NITRITE NEGATIVE 05/06/2018 1609   LEUKOCYTESUR NEGATIVE 05/06/2018 1609     Ripudeep Rai M.D. [facility] 05/12/2018, 11:30 AM  Pager: [phone] Between 7am to 7pm - call Pager - [phone]  After 7pm go to [email or web address] - password [email or web address]  Call night coverage person covering after 7pm

## 2018-05-13 DIAGNOSIS — N179 Acute kidney failure, unspecified: Secondary | ICD-10-CM

## 2018-05-13 DIAGNOSIS — K567 Ileus, unspecified: Secondary | ICD-10-CM

## 2018-05-13 LAB — CBC
HCT: 25.1 % — ABNORMAL LOW (ref 39.0–52.0)
HEMOGLOBIN: 8 g/dL — AB (ref 13.0–17.0)
MCH: 27.5 pg (ref 26.0–34.0)
MCHC: 31.9 g/dL (ref 30.0–36.0)
MCV: 86.3 fL (ref 80.0–100.0)
Platelets: 768 10*3/uL — ABNORMAL HIGH (ref 150–400)
RBC: 2.91 MIL/uL — AB (ref 4.22–5.81)
RDW: 14.8 % (ref 11.5–15.5)
WBC: 11.7 10*3/uL — ABNORMAL HIGH (ref 4.0–10.5)
nRBC: 0 % (ref 0.0–0.2)

## 2018-05-13 LAB — BASIC METABOLIC PANEL
ANION GAP: 7 (ref 5–15)
BUN: 22 mg/dL (ref 8–23)
CO2: 28 mmol/L (ref 22–32)
Calcium: 7.8 mg/dL — ABNORMAL LOW (ref 8.9–10.3)
Chloride: 101 mmol/L (ref 98–111)
Creatinine, Ser: 2.74 mg/dL — ABNORMAL HIGH (ref 0.61–1.24)
GFR calc Af Amer: 26 mL/min — ABNORMAL LOW (ref >60)
GFR, EST NON AFRICAN AMERICAN: 22 mL/min — AB (ref >60)
Glucose, Bld: 103 mg/dL — ABNORMAL HIGH (ref 70–99)
POTASSIUM: 3.2 mmol/L — AB (ref 3.5–5.1)
SODIUM: 136 mmol/L (ref 135–145)

## 2018-05-13 LAB — PATHOLOGIST SMEAR REVIEW

## 2018-05-13 LAB — APTT: APTT: 54 s — AB (ref 24–36)

## 2018-05-13 MED ORDER — SORBITOL 70 % SOLN
960.0000 mL | TOPICAL_OIL | Freq: Once | ORAL | Status: AC
  Administered 2018-05-13: 960 mL via RECTAL
  Filled 2018-05-13: qty 473

## 2018-05-13 MED ORDER — POTASSIUM CHLORIDE CRYS ER 20 MEQ PO TBCR
40.0000 meq | EXTENDED_RELEASE_TABLET | Freq: Once | ORAL | Status: AC
  Administered 2018-05-13: 40 meq via ORAL
  Filled 2018-05-13: qty 2

## 2018-05-13 NOTE — Progress Notes (Signed)
PHARMACY CONSULT NOTE FOR:  OUTPATIENT  PARENTERAL ANTIBIOTIC THERAPY (OPAT)  Indication: MRSA bacteremia Regimen: Daptomycin 730 mg every 48 hours End date: 05/31/18  IV antibiotic discharge orders are pended. To discharging provider:  please sign these orders via discharge navigator,  Select New Orders & click on the button choice - Manage This Unsigned Work.     Thank you for allowing pharmacy to be a part of this patient's care.  Della GooEmily S Dilon Lank, PharmD, BCPS Infectious Diseases Clinical Pharmacist Phone: [phone] 05/13/2018, 3:50 PM

## 2018-05-13 NOTE — Progress Notes (Signed)
[facility]                                                                              Patient Demographics  Wayne Buckley, is a 67 y.o. male, DOB - 1950/07/07, SJG:[id number]  Admit date - 05/02/2018   Admitting Physician Shela Leff, MD  Outpatient Primary MD for the patient is [name], MD  Outpatient specialists:   LOS - 11  days   Medical records reviewed and are as summarized below:    Chief Complaint  Patient presents with  . Fever       Brief summary    Wayne Buckley is a 67 y.o. malewith medical history significant oftraumatic brain injury, central cord syndrome, neurogenic bladder with indwelling Foley catheter, bilateral leg weakness, prior DVT, hypertension, alcohol abuse. He presented secondary to fever. He was found to have MRSA in his urine prior to arrival. Blood cultures positive for MRSA and CT abdomen concerning for ileus.  Assessment & Plan    Principal Problem: Sepsis -Patient met criteria for sepsis at the time of admission, source is secondary to UTI and MRSA bacteremia  Active problems MRSA bacteremia, MRSA UTI -Patient empirically treated with vancomycin and cefepime. -Repeat blood cultures 11/15 showed no growth, leukocytosis still trending up -2D echo showed no evidence of vegetations - ID following, initially was placed on IV vancomycin, now changed to daptomycin.   - TEE was unsuccessful due to ?obstruction, esophagogram showed no stricture -Leukocytosis continue to improve, 11.7 today  Acute kidney injury with metabolic acidosis  -Vancomycin discontinued, placed on IV fluids with bicarb, likely worsened due to acute urinary retention -Renal ultrasound did not show any obstruction or hydronephrosis.   -Creatinine improving, 2.7 today, DC bicarb drip  Ileus/abdominal distention -Patient has been followed by general surgery and gastroenterology, differentials including Ogilvie's syndrome, mechanical  component - Esophagogram results discussed with GI, Dr. [name].  Per GI, patient will not be able to tolerate EGD with multiple comorbidities currently and no obvious stricture on the esophagram. -Regarding ileus, recommended enemas, advance diet, increase mobility, sit upright.  Patient is not a candidate for neostigmine or colonoscopy decompression.  Per GI, this is commonly seen in patients with TBI and cord compression and will spontaneously resolve. -Abdomen somewhat more distended today, will give enema -diet advanced to soft solids  Urinary retention with neurogenic bladder -Per admission history patient with indwelling Foley catheter secondary to neurogenic bladder, TBI and cord compression - follow outpatient with urology and outpatient voiding trial, patient was supposed to have appointment today on 11/20.  Foley catheter changed.  Anemia, likely chronic H&H stable  Leukocytosis Worsening, likely due to #1 and #2, follow closely, blood cultures been negative so far, afebrile  History of DVT, left lower extremity, diagnosed in 02/2018 Continue Eliquis  History of TBI -Reviewed recommendations from inpatient rehab, recommended skilled nursing facility discussed with patient's wife  Essential hypertension BP stable, continue amlodipine and Coreg  History of alcohol use Currently stable, no acute issues  Pressure injury POA, bilateral buttocks, deep tissue injury Wound care per nursing  Thrombocytosis -Likely reactive from sepsis, bacteremia, platelet count  improving  Hypokalemia Replaced  Code Status: Full CODE STATUS DVT Prophylaxis: Apixaban Family Communication: Discussed in detail with the patient, all imaging results, lab results explained to the patient's wife.     Disposition Plan:, Skilled nursing facility in a.m. if available  Time Spent in minutes 25 minutes  Procedures:  2D echo EF 60 to 65%, no vegetations  Consultants:   GI Infectious  disease General surgery Nephrology  Antimicrobials:      Medications  Scheduled Meds: . amLODipine  10 mg Oral Daily  . apixaban  5 mg Oral BID  . bisacodyl  10 mg Rectal Daily  . carvedilol  12.5 mg Oral BID WC  . collagenase   Topical Daily  . liver oil-zinc oxide   Topical QID  . polyethylene glycol  17 g Oral BID  . senna-docusate  1 tablet Oral BID   Continuous Infusions: . DAPTOmycin (CUBICIN)  IV 730 mg (05/12/18 1947)   PRN Meds:.acetaminophen, hydrALAZINE   Antibiotics   Anti-infectives (From admission, onward)   Start     Dose/Rate Route Frequency Ordered Stop   05/10/18 2000  DAPTOmycin (CUBICIN) 730 mg in sodium chloride 0.9 % IVPB     730 mg 229.2 mL/hr over 30 Minutes Intravenous Every 48 hours 05/10/18 1414     05/09/18 1205  vancomycin variable dose per unstable renal function (pharmacist dosing)  Status:  Discontinued      Does not apply See admin instructions 05/09/18 1205 05/09/18 1824   05/03/18 1400  ceFEPIme (MAXIPIME) 2 g in sodium chloride 0.9 % 100 mL IVPB  Status:  Discontinued     2 g 200 mL/hr over 30 Minutes Intravenous Every 24 hours 05/02/18 1526 05/03/18 1026   05/03/18 0330  vancomycin (VANCOCIN) IVPB 750 mg/150 ml premix  Status:  Discontinued     750 mg 150 mL/hr over 60 Minutes Intravenous Every 12 hours 05/02/18 1526 05/09/18 1205   05/02/18 1500  vancomycin (VANCOCIN) IVPB 1000 mg/200 mL premix     1,000 mg 200 mL/hr over 60 Minutes Intravenous NOW 05/02/18 1448 05/02/18 1620   05/02/18 1400  vancomycin (VANCOCIN) IVPB 1000 mg/200 mL premix     1,000 mg 200 mL/hr over 60 Minutes Intravenous  Once 05/02/18 1354 05/02/18 1520   05/02/18 1400  ceFEPIme (MAXIPIME) 2 g in sodium chloride 0.9 % 100 mL IVPB     2 g 200 mL/hr over 30 Minutes Intravenous  Once 05/02/18 1354 05/02/18 1445        Subjective:   Wayne Buckley was seen and examined today.  Low-grade temp overnight.  Afebrile this morning.  No acute issues.  Denies any  abdominal pain, nausea vomiting  Objective:   Vitals:   05/12/18 1840 05/12/18 2047 05/13/18 0348 05/13/18 1001  BP: 130/83 (!) 119/102 129/77 120/75  Pulse: 85 88 85 82  Resp: 18 18 18 19   Temp: (!) 100.9 F (38.3 C) 100.1 F (37.8 C) 98.9 F (37.2 C) 98.2 F (36.8 C)  TempSrc: Oral Oral Oral Oral  SpO2: 94% 95% 96% 90%  Weight:      Height:        Intake/Output Summary (Last 24 hours) at 05/13/2018 1434 Last data filed at 05/13/2018 0600 Gross per 24 hour  Intake 1934 ml  Output 350 ml  Net 1584 ml     Wt Readings from Last 3 Encounters:  05/09/18 90.8 kg  04/03/18 101.3 kg  03/02/18 99.8 kg      Physical Exam  General: Alert and oriented x 3, NAD, dysarthria Eyes:  HEENT:  Atraumatic, normocephalic Cardiovascular: S1 S2 clear, RRR  No pedal edema b/l Respiratory: Clear to auscultation bilaterally, no wheezing, rales or rhonchi Gastrointestinal: nontender, + more distended today, + bowel sounds Ext: no pedal edema bilaterally Neuro: no new deficits Musculoskeletal: No digital cyanosis, clubbing Skin: No rashes Psych: Normal affect and demeanor, alert and oriented x3      Data Reviewed:  I have personally reviewed following labs and imaging studies  Micro Results Recent Results (from the past 240 hour(s))  Culture, blood (routine x 2)     Status: None   Collection Time: 05/03/18 10:01 PM  Result Value Ref Range Status   Specimen Description BLOOD RIGHT HAND  Final   Special Requests   Final    BOTTLES DRAWN AEROBIC AND ANAEROBIC Blood Culture adequate volume   Culture   Final    NO GROWTH 5 DAYS Performed at [facility], [address], [place], [place] [address]    Report Status 05/08/2018 FINAL  Final  Culture, blood (routine x 2)     Status: None   Collection Time: 05/03/18 10:09 PM  Result Value Ref Range Status   Specimen Description BLOOD LEFT HAND  Final   Special Requests   Final    BOTTLES DRAWN AEROBIC ONLY Blood Culture  adequate volume   Culture   Final    NO GROWTH 5 DAYS Performed at [facility], [place] [address], [place], [place] [address]    Report Status 05/08/2018 FINAL  Final  Culture, blood (routine x 2)     Status: None (Preliminary result)   Collection Time: 05/09/18  8:10 PM  Result Value Ref Range Status   Specimen Description BLOOD LEFT ARM  Final   Special Requests   Final    BOTTLES DRAWN AEROBIC ONLY Blood Culture results may not be optimal due to an inadequate volume of blood received in culture bottles   Culture   Final    NO GROWTH 4 DAYS Performed at [facility], [place] [address] [place], [place] [address]    Report Status PENDING  Incomplete  Culture, blood (routine x 2)     Status: None (Preliminary result)   Collection Time: 05/09/18  8:20 PM  Result Value Ref Range Status   Specimen Description BLOOD BLOOD LEFT FOREARM  Final   Special Requests   Final    BOTTLES DRAWN AEROBIC ONLY Blood Culture results may not be optimal due to an inadequate volume of blood received in culture bottles   Culture   Final    NO GROWTH 4 DAYS Performed at [facility], [place] [address] [place], [place] [address]    Report Status PENDING  Incomplete    Radiology Reports Ct Abdomen Pelvis Wo Contrast  Result Date: 05/02/2018 CLINICAL DATA:  Abdominal distention EXAM: CT ABDOMEN AND PELVIS WITHOUT CONTRAST TECHNIQUE: Multidetector CT imaging of the abdomen and pelvis was performed following the standard protocol without IV contrast. COMPARISON:  Same day radiographs of the abdomen and pelvis FINDINGS: Lower chest: Top-normal heart size. Trace pleural effusions with bibasilar atelectasis. Hepatobiliary: No focal liver abnormality is seen. No gallstones, gallbladder wall thickening, or biliary dilatation. Pancreas: Unremarkable. No pancreatic ductal dilatation or surrounding inflammatory changes. Spleen: Normal in size without focal abnormality. Adrenals/Urinary Tract: Normal  bilateral adrenal glands. No nephrolithiasis. Motion artifacts limit assessment at the level of the kidneys however there does appear  to be a hypodense cystic lesion of the interpolar left kidney measuring up to 6 cm. No nephrolithiasis nor hydroureteronephrosis. Foley decompressed urinary bladder. Stomach/Bowel: Small hiatal hernia. Decompressed stomach and small bowel loops. Normal small bowel rotation. Moderate gaseous distention of the colon up to 9 cm at the level of the cecum with liquid stool noted within from cecum to distal descending colon likely representing large bowel dysmotility or ileus. Circumferential thickening surrounding a small stool ball is noted within the rectum. Mild stercoral proctitis is not excluded. Vascular/Lymphatic: IVC filter in place. Atherosclerosis of the aorta without aneurysm. No adenopathy. Reproductive: Normal size prostate. Other: Soft tissue anasarca. No free air or free fluid. Musculoskeletal: Status post ventral hernia repair. Small amount of fat in the right inguinal canal. IMPRESSION: 1. Moderate gaseous distention of the colon up to 9 cm at the level of the cecum with liquid stool noted within from cecum to distal descending colon. Findings may represent a large bowel dysmotility or ileus. Mechanical source of obstruction is identified. 2. Circumferential thickening surrounding a small stool ball within the rectum. Mild stercoral proctitis could have this appearance. 3. 6 cm hypodense cystic lesion of the interpolar left kidney, likely a simple cyst however further characterization is limited due to motion. 4. IVC filter in place. 5. Mild soft tissue anasarca. Electronically Signed   By: Ashley Royalty M.D.   On: 05/02/2018 16:46   Dg Abdomen 1 View  Result Date: 05/02/2018 CLINICAL DATA:  67 y/o  M; abdominal distention. EXAM: ABDOMEN - 1 VIEW COMPARISON:  05/02/2018 CT abdomen and pelvis. FINDINGS: Diffuse air-filled distention of the large bowel. Upper abdomen is  excluded from the field of view. IVC filter in situ. Advanced degenerative changes of the visible lumbar spine. Lower abdominal wall mesh hernia repair anchors noted. IMPRESSION: Diffuse air-filled distention of the large bowel, possibly chronic large bowel dysmotility or ileus. Electronically Signed   By: Kristine Garbe M.D.   On: 05/02/2018 20:39   Dg Esophagus  Result Date: 05/09/2018 CLINICAL DATA:  Dysphagia EXAM: ESOPHOGRAM/BARIUM SWALLOW TECHNIQUE: Single contrast examination was performed using  thin barium. FLUOROSCOPY TIME:  Fluoroscopy Time:  2 minutes 24 seconds Radiation Exposure Index (if provided by the fluoroscopic device): Number of Acquired Spot Images: 0 COMPARISON:  None. FINDINGS: Limited study due to patient's condition. There are no fixed strictures visualized. No reflux noted during the study. The patient had difficulty swallowing the 13 mm barium tablet which remains present in the back of the throat for several swallows, finally passing into the esophagus. This sticks in the mid to distal esophagus despite the lack of visible stricture in this area. IMPRESSION: No visible stricture seen, but the 13 mm barium tablet sticks in the mid to distal esophagus. May consider further evaluation with endoscopy. Electronically Signed   By: Rolm Baptise M.D.   On: 05/09/2018 10:37   US Renal  Result Date: 05/11/2018 CLINICAL DATA:  Urinary retention EXAM: RENAL / URINARY TRACT ULTRASOUND COMPLETE COMPARISON:  CT of the abdomen and pelvis on 05/02/2018 FINDINGS: Right Kidney: Renal measurements: 16.3 x 6.6 x 7.4 cm = volume: 415 mL . Echogenicity within normal limits. No mass or hydronephrosis visualized. Left Kidney: Renal measurements: 15.4 x 8.1 x 7.7 cm = volume: 500 mL. Echogenicity within normal limits. No mass or hydronephrosis visualized. Simple cyst of the interpolar left kidney measures roughly 4.7 x 5.9 x 3.9 cm. Bladder: The bladder is decompressed by a Foley catheter.  IMPRESSION: Unremarkable renal ultrasound.  Electronically Signed   By: Aletta Edouard M.D.   On: 05/11/2018 14:35   Dg Chest Port 1 View  Result Date: 05/02/2018 CLINICAL DATA:  Fever EXAM: PORTABLE CHEST 1 VIEW COMPARISON:  03/03/2018 FINDINGS: The heart size and mediastinal contours are within normal limits. Both lungs are clear. The visualized skeletal structures are unremarkable. IMPRESSION: No active disease. Electronically Signed   By: Franchot Gallo M.D.   On: 05/02/2018 14:35   Dg Abd 2 Views  Result Date: 05/09/2018 CLINICAL DATA:  Abdominal distention. EXAM: ABDOMEN - 2 VIEW COMPARISON:  05/04/2018, 05/02/2018.  CT 05/02/2018. FINDINGS: IVC filter noted tilted over the upper portion of the inferior vena cava in unchanged position. Distended loops of colon again noted. Colon is slightly less distended on today's exam. Several nonspecific air-filled loops of small bowel noted. No free air is identified. Degenerative changes lumbar spine. IMPRESSION: Dilated loops of colon again noted. Colon is slightly less distended on today's exam. Electronically Signed   By: Marcello Moores  Register   On: 05/09/2018 09:58   Dg Abd Portable 1v  Result Date: 05/04/2018 CLINICAL DATA:  Follow-up ileus EXAM: PORTABLE ABDOMEN - 1 VIEW COMPARISON:  05/02/2018 FINDINGS: Distended colon is again noted and stable consistent with generalized ileus. No definitive obstructive changes are seen. Postsurgical changes in the low pelvis are noted. Degenerative changes of the lumbar spine are seen. No free air is noted. IMPRESSION: Stable gaseous dilatation of the colon consistent with ileus. Electronically Signed   By: Inez Catalina M.D.   On: 05/04/2018 09:40    Lab Data:  CBC: Recent Labs  Lab 05/09/18 0644 05/10/18 1950 05/11/18 0615 05/12/18 0628 05/13/18 0503  WBC 19.2* 23.1* 17.1* 12.9* 11.7*  HGB 9.0* 8.6* 7.4* 7.6* 8.0*  HCT 29.4* 28.2* 23.1* 24.6* 25.1*  MCV 89.6 89.2 88.2 87.5 86.3  PLT 849* 915* 701*  779* 932*   Basic Metabolic Panel: Recent Labs  Lab 05/09/18 1041 05/10/18 0642 05/11/18 0615 05/12/18 0628 05/13/18 0629  NA 135 133* 133* 136 136  K 4.9 4.7 3.2* 3.1* 3.2*  CL 110 109 104 105 101  CO2 18* 17* 20* 22 28  GLUCOSE 112* 103* 107* 95 103*  BUN 12 19 26* 26* 22  CREATININE 1.94* 2.75* 3.44* 2.96* 2.74*  CALCIUM 8.4* 8.5* 7.8* 7.8* 7.8*  MG  --  1.9  --   --   --    GFR: Estimated Creatinine Clearance: 28.7 mL/min (A) (by C-G formula based on SCr of 2.74 mg/dL (H)). Liver Function Tests: No results for input(s): AST, ALT, ALKPHOS, BILITOT, PROT, ALBUMIN in the last 168 hours. No results for input(s): LIPASE, AMYLASE in the last 168 hours. No results for input(s): AMMONIA in the last 168 hours. Coagulation Profile: No results for input(s): INR, PROTIME in the last 168 hours. Cardiac Enzymes: Recent Labs  Lab 05/11/18 0615  CKTOTAL 26*   BNP (last 3 results) No results for input(s): PROBNP in the last 8760 hours. HbA1C: No results for input(s): HGBA1C in the last 72 hours. CBG: Recent Labs  Lab 05/11/18 2027  GLUCAP 111*   Lipid Profile: No results for input(s): CHOL, HDL, LDLCALC, TRIG, CHOLHDL, LDLDIRECT in the last 72 hours. Thyroid Function Tests: No results for input(s): TSH, T4TOTAL, FREET4, T3FREE, THYROIDAB in the last 72 hours. Anemia Panel: No results for input(s): VITAMINB12, FOLATE, FERRITIN, TIBC, IRON, RETICCTPCT in the last 72 hours. Urine analysis:    Component Value Date/Time   COLORURINE YELLOW 05/06/2018 1609  APPEARANCEUR TURBID (A) 05/06/2018 1609   LABSPEC 1.012 05/06/2018 1609   PHURINE 5.0 05/06/2018 1609   GLUCOSEU NEGATIVE 05/06/2018 1609   HGBUR NEGATIVE 05/06/2018 1609   BILIRUBINUR NEGATIVE 05/06/2018 [place] 05/06/2018 1609   PROTEINUR NEGATIVE 05/06/2018 1609   UROBILINOGEN 0.2 05/08/2012 0314   NITRITE NEGATIVE 05/06/2018 1609   LEUKOCYTESUR NEGATIVE 05/06/2018 1609     Bronte Sabado  M.D. [facility] 05/13/2018, 2:34 PM  Pager: [phone] Between 7am to 7pm - call Pager - [phone]  After 7pm go to [email or web address] - password [email or web address]  Call night coverage person covering after 7pm

## 2018-05-13 NOTE — Progress Notes (Signed)
Physical Therapy Treatment Patient Details Name: Wayne Buckley MRN: [id number] DOB: 20-Oct-1950 Today's Date: 05/13/2018    History of Present Illness Pt is a 67 y/o male with a PMH signficant for TBI, central cord syndrome, neurogenic bladder with indwelling foley catheter, BLE weakness, prior DVT, HTN, alcohol abuse presenting to the hospital for evaluation of fever. In ED, urine culture grew MRSA and CT abdomen revealed possible large bowel dysmotility or ileus.     PT Comments    Pt seen in conjunction with OT to maximize functional mobility and safety. He is progressing slowly towards physical therapy goals. Max encouragement provided from therapists and pt's wife to participate in OOB mobility. Heavy +2 assist continues to be required for transfer to chair utilizing [place], and pt was able to demonstrate improved trunk extension in [name] this session. Will continue to follow and progress as able per POC.   Follow Up Recommendations  SNF;Supervision/Assistance - 24 hour     Equipment Recommendations  None recommended by PT    Recommendations for Other Services       Precautions / Restrictions Precautions Precautions: Fall Precaution Comments: indwelling suprapubic catheter Restrictions Weight Bearing Restrictions: No    Mobility  Bed Mobility Overal bed mobility: Needs Assistance Bed Mobility: Supine to Sit Rolling: +2 for physical assistance;Total assist Sidelying to sit: +2 for physical assistance;HOB elevated;Max assist       General bed mobility comments: Pt resisting movement; once EOB sat with Mod A at times due to trunkal ataxia and impulsivity  Transfers Overall transfer level: Needs assistance   Transfers: Sit to/from Stand Sit to Stand: +2 physical assistance;Max assist         General transfer comment: Pt required vc for postural positioning. Overall heavy +2 assist for sit<>stand with pad under hips to facilitate hip extension.  Ambulation/Gait             General Gait Details: Unable   Stairs             Wheelchair Mobility    Modified Rankin (Stroke Patients Only)       Balance Overall balance assessment: Needs assistance Sitting-balance support: Bilateral upper extremity supported;Feet supported Sitting balance-[name] Scale: Poor     Standing balance support: Bilateral upper extremity supported;During functional activity Standing balance-[name] Scale: Zero Standing balance comment: +2 assist required                            Cognition Arousal/Alertness: Awake/alert Behavior During Therapy: Restless;Impulsive Overall Cognitive Status: History of cognitive impairments - at baseline                                 General Comments: Wife present during session and a big motivator for pt. Would recommend wife be present for future therapy sessions      Exercises General Exercises - Upper Extremity Shoulder Flexion: 10 reps;Right;Left Shoulder ABduction: AAROM;Both;10 reps;Seated Elbow Flexion: AROM;AAROM;Strengthening;Both;10 reps;Seated Elbow Extension: AROM;AAROM;Both;10 reps;Seated General Exercises - Lower Extremity Long Arc Quad: AROM;10 reps;Both    General Comments        Pertinent Vitals/Pain Pain Assessment: Faces Faces Pain Scale: Hurts little more Pain Location: hurts all over Pain Descriptors / Indicators: Discomfort;Grimacing;Moaning Pain Intervention(s): Monitored during session    Home Living  Prior Function            PT Goals (current goals can now be found in the care plan section) Acute Rehab PT Goals Patient Stated Goal: Sit in the chair PT Goal Formulation: With family Time For Goal Achievement: 05/17/18 Potential to Achieve Goals: Good Progress towards PT goals: Progressing toward goals    Frequency    Min 2X/week      PT Plan Current plan remains appropriate    Co-evaluation PT/OT/SLP  Co-Evaluation/Treatment: Yes Reason for Co-Treatment: Complexity of the patient's impairments (multi-system involvement);Necessary to address cognition/behavior during functional activity;For patient/therapist safety;To address functional/ADL transfers PT goals addressed during session: Balance;Mobility/safety with mobility;Proper use of DME;Strengthening/ROM OT goals addressed during session: ADL's and self-care;Strengthening/ROM      AM-PAC PT "6 Clicks" Mobility   Outcome Measure  Help needed turning from your back to your side while in a flat bed without using bedrails?: Total Help needed moving from lying on your back to sitting on the side of a flat bed without using bedrails?: Total Help needed moving to and from a bed to a chair (including a wheelchair)?: Total Help needed standing up from a chair using your arms (e.g., wheelchair or bedside chair)?: Total Help needed to walk in hospital room?: Total Help needed climbing 3-5 steps with a railing? : Total 6 Click Score: 6    End of Session Equipment Utilized During Treatment: Gait belt Activity Tolerance: Patient tolerated treatment well Patient left: in chair;with call bell/phone within reach;with chair alarm set;with family/visitor present Nurse Communication: Mobility status;Need for lift equipment PT Visit Diagnosis: History of falling (Z91.81);Difficulty in walking, not elsewhere classified (R26.2)     Time: [id number] PT Time Calculation (min) (ACUTE ONLY): 24 min  Charges:  $Therapeutic Activity: 8-22 mins                     Conni SlipperLaura Ericca Labra, PT, DPT Acute Rehabilitation Services Pager: [phone] Office: [phone]    Marylynn PearsonLaura D Nitasha Jewel 05/13/2018, 12:53 PM

## 2018-05-13 NOTE — Clinical Social Work Note (Signed)
MD's progress note reviewed and patient will be ready for dsicharge on Tuesday, 11/26. [facility] and message left regarding pending discharge. CSW will continue to follow and facilitate [facility] when medically stable.  Genelle BalVanessa Rody Keadle, MSW, LCSW Licensed Clinical Social Worker Clinical Social Work Department [facility] [phone]

## 2018-05-13 NOTE — Progress Notes (Signed)
Occupational Therapy Treatment Patient Details Name: Wayne Buckley MRN: [id number] DOB: 01/04/1951 Today's Date: 05/13/2018    History of present illness Pt is a 67 y/o male with a PMH signficant for TBI, central cord syndrome, neurogenic bladder with indwelling foley catheter, BLE weakness, prior DVT, HTN, alcohol abuse presenting to the hospital for evaluation of fever. In ED, urine culture grew MRSA and CT abdomen revealed possible large bowel dysmotility or ileus.    OT comments  Seen as co-treat with PT with focus of session mobilizing to chair following by seated grooming task. Pt requires Max encouragement to participate. +2Max A with use of Stedy to mobilize to chair. Encouraged wife to complete seated BUE strengthening as tolerated and participation in seated grooming tasks. Pt appears to enjoy being OOB in chair and is looking forward to his visit with his daughter who is a sophomore in college. Will follow acutely and recommend rehab at SNF.   Follow Up Recommendations  SNF;Supervision/Assistance - 24 hour    Equipment Recommendations  Other (comment)(TBA at SNF)    Recommendations for Other Services      Precautions / Restrictions Precautions Precautions: Fall Precaution Comments: indwelling catheter       Mobility Bed Mobility Overal bed mobility: Needs Assistance Bed Mobility: Supine to Sit Rolling: +2 for physical assistance;Total assist         General bed mobility comments: Pt resisting movemetn; once EOB sat with Mod A at times due to trunkal ataxia and impulsivity  Transfers Overall transfer level: Needs assistance   Transfers: Sit to/from Stand Sit to Stand: +2 physical assistance;Max assist         General transfer comment: vc for postural positioning    Balance Overall balance assessment: Needs assistance   Sitting balance-[name] Scale: Poor       Standing balance-[name] Scale: Zero Standing balance comment: +2 assist required                           ADL either performed or assessed with clinical judgement   ADL Overall ADL's : Needs assistance/impaired     Grooming: Minimal assistance;Sitting;Oral care;Brushing hair                               Functional mobility during ADLs: +2 for physical assistance;Maximal assistance;Cueing for safety;Cueing for sequencing(with use of Stedy)       Vision       Perception     Praxis      Cognition Arousal/Alertness: Awake/alert Behavior During Therapy: Restless;Impulsive Overall Cognitive Status: History of cognitive impairments - at baseline                                          Exercises General Exercises - Upper Extremity Shoulder Flexion: 10 reps;Right;Left Shoulder ABduction: AAROM;Both;10 reps;Seated Elbow Flexion: AROM;AAROM;Strengthening;Both;10 reps;Seated Elbow Extension: AROM;AAROM;Both;10 reps;Seated   Shoulder Instructions       General Comments      Pertinent Vitals/ Pain       Pain Assessment: Faces Faces Pain Scale: Hurts little more Pain Location: hurts all over Pain Descriptors / Indicators: Discomfort;Grimacing;Moaning Pain Intervention(s): Limited activity within patient's tolerance  Home Living  Prior Functioning/Environment              Frequency  Min 2X/week        Progress Toward Goals  OT Goals(current goals can now be found in the care plan section)  Progress towards OT goals: Progressing toward goals  Acute Rehab OT Goals Patient Stated Goal: Sit in the chair OT Goal Formulation: With patient/family Time For Goal Achievement: 05/20/18 Potential to Achieve Goals: Good ADL Goals Pt Will Perform Grooming: with set-up;with supervision;sitting Pt Will Perform Upper Body Bathing: with set-up;with supervision;sitting Pt Will Perform Lower Body Bathing: with mod assist Pt Will Transfer to Toilet: with mod assist;with  +2 assist;stand pivot transfer;bedside commode Additional ADL Goal #1: Pt will be able to come up to sit EOB with Mod A in prep for ADLs and tranfers OOB  Plan Discharge plan remains appropriate    Co-evaluation    PT/OT/SLP Co-Evaluation/Treatment: Yes Reason for Co-Treatment: Complexity of the patient's impairments (multi-system involvement);Necessary to address cognition/behavior during functional activity;For patient/therapist safety;To address functional/ADL transfers   OT goals addressed during session: ADL's and self-care;Strengthening/ROM      AM-PAC OT "6 Clicks" Daily Activity     Outcome Measure   Help from another person eating meals?: A Little Help from another person taking care of personal grooming?: A Little Help from another person toileting, which includes using toliet, bedpan, or urinal?: A Lot Help from another person bathing (including washing, rinsing, drying)?: A Lot Help from another person to put on and taking off regular upper body clothing?: A Lot Help from another person to put on and taking off regular lower body clothing?: Total 6 Click Score: 13    End of Session Equipment Utilized During Treatment: Gait belt  OT Visit Diagnosis: Unsteadiness on feet (R26.81);Other abnormalities of gait and mobility (R26.89);Muscle weakness (generalized) (M62.81);History of falling (Z91.81);Pain;Ataxia, unspecified (R27.0);Other symptoms and signs involving cognitive function Pain - part of body: (all over)   Activity Tolerance Patient tolerated treatment well   Patient Left in chair;with call bell/phone within reach;with chair alarm set;with family/visitor present   Nurse Communication Mobility status;Need for lift equipment(Maximove)        Time: 1610-9604 OT Time Calculation (min): 25 min  Charges: OT General Charges $OT Visit: 1 Visit OT Treatments $Self Care/Home Management : 8-22 mins  Luisa Dago, OT/L   Acute OT Clinical Specialist Acute  Rehabilitation Services Pager [phone] Office [phone]    [facility] 05/13/2018, 11:19 AM

## 2018-05-13 NOTE — Progress Notes (Signed)
[facility]    Date of Admission:  05/02/2018   Total days of antibiotics 12           ID: Wayne Buckley is a 67 y.o. male with MRSA bacteremia-complicated  Principal Problem:   Ogilvie's syndrome Active Problems:   Alcohol abuse   AKI (acute kidney injury) (HCC)   Central cord syndrome (HCC)   Essential hypertension   Sepsis (HCC)   Chronic anemia   Hyponatremia   Sacral ulcer (HCC)   History of DVT (deep vein thrombosis)   Traumatic brain injury (HCC)   MRSA bacteremia   Bacteremia    Subjective: Feeling better, less abdominal distention. Isolated fever yesterday of 100.9  Leukocytosis improved; no new positive cultures  Medications:  . amLODipine  10 mg Oral Daily  . apixaban  5 mg Oral BID  . bisacodyl  10 mg Rectal Daily  . carvedilol  12.5 mg Oral BID WC  . collagenase   Topical Daily  . liver oil-zinc oxide   Topical QID  . polyethylene glycol  17 g Oral BID  . senna-docusate  1 tablet Oral BID    Objective: Vital signs in last 24 hours: Temp:  [98.2 F (36.8 C)-100.9 F (38.3 C)] 98.8 F (37.1 C) (11/25 1625) Pulse Rate:  [82-92] 92 (11/25 1625) Resp:  [18-19] 18 (11/25 1625) BP: (119-130)/(75-102) 120/78 (11/25 1625) SpO2:  [90 %-96 %] 95 % (11/25 1625) Physical Exam  Constitutional: He is oriented to person, place. He appears well-developed and well-nourished. No distress.  HENT:  Mouth/Throat: Oropharynx is clear and moist. No oropharyngeal exudate.  Cardiovascular: Normal rate, regular rhythm and normal heart sounds. Exam reveals no gallop and no friction rub.  No murmur heard.  Pulmonary/Chest: Effort normal and breath sounds normal. No respiratory distress. He has no wheezes.  Abdominal: Soft. Bowel sounds are slow. Distended but not tight/ non tender Lymphadenopathy:  He has no cervical adenopathy.  Neurological: He is alert and oriented to person, place, Skin: Skin is warm and dry. No rash noted. No erythema.    Psychiatric: He has a normal mood and affect. His behavior is normal.     Lab Results Recent Labs    05/12/18 0628 05/13/18 0503 05/13/18 0629  WBC 12.9* 11.7*  --   HGB 7.6* 8.0*  --   HCT 24.6* 25.1*  --   NA 136  --  136  K 3.1*  --  3.2*  CL 105  --  101  CO2 22  --  28  BUN 26*  --  22  CREATININE 2.96*  --  2.74*    Microbiology: 11/14 blood cx mrsa 11/15 blood cx ngtd 11/21 blood cx ngtd Studies/Results: No results found.   Assessment/Plan: Complicated MRSA bacteremia = will treat for 4 wk since unable to rule out endocarditis on TEE. TTE did not suggest vegetation. Plan to finish course with daptomycin at 8mg /kg/qod for now, renally dosed. opat orders are in to have daptomycin til dec 13th.  Needs weekly cbc, bmp, ck. ( if cr function improves then may need to address daily dosing)  Will need tunneled central line (not a picc line -- at risk for pulling out)  aki = slowly improving. Appears to have peaked but would still stay clear of nephrotoxic medications.  Fever = isolated yesterday. If afebrile today, then he is clear for getting central access tomorrow.  Will see back in id clinic mid-late December  Will  sign off  [facility] for Infectious Diseases Cell: [phone] Pager: [phone]  05/13/2018, 6:14 PM

## 2018-05-14 ENCOUNTER — Encounter (HOSPITAL_COMMUNITY): Payer: Self-pay | Admitting: Diagnostic Radiology

## 2018-05-14 ENCOUNTER — Inpatient Hospital Stay (HOSPITAL_COMMUNITY): Payer: Medicare Other

## 2018-05-14 HISTORY — PX: IR US GUIDE VASC ACCESS RIGHT: IMG2390

## 2018-05-14 HISTORY — PX: IR FLUORO GUIDE CV LINE RIGHT: IMG2283

## 2018-05-14 LAB — CBC
HEMATOCRIT: 24.5 % — AB (ref 39.0–52.0)
Hemoglobin: 7.6 g/dL — ABNORMAL LOW (ref 13.0–17.0)
MCH: 27 pg (ref 26.0–34.0)
MCHC: 31 g/dL (ref 30.0–36.0)
MCV: 86.9 fL (ref 80.0–100.0)
PLATELETS: 660 10*3/uL — AB (ref 150–400)
RBC: 2.82 MIL/uL — ABNORMAL LOW (ref 4.22–5.81)
RDW: 14.7 % (ref 11.5–15.5)
WBC: 9.7 10*3/uL (ref 4.0–10.5)
nRBC: 0 % (ref 0.0–0.2)

## 2018-05-14 LAB — CULTURE, BLOOD (ROUTINE X 2)
CULTURE: NO GROWTH
CULTURE: NO GROWTH

## 2018-05-14 LAB — BASIC METABOLIC PANEL
Anion gap: 8 (ref 5–15)
BUN: 23 mg/dL (ref 8–23)
CALCIUM: 7.9 mg/dL — AB (ref 8.9–10.3)
CHLORIDE: 101 mmol/L (ref 98–111)
CO2: 28 mmol/L (ref 22–32)
CREATININE: 2.7 mg/dL — AB (ref 0.61–1.24)
GFR calc Af Amer: 27 mL/min — ABNORMAL LOW (ref >60)
GFR calc non Af Amer: 23 mL/min — ABNORMAL LOW (ref >60)
Glucose, Bld: 89 mg/dL (ref 70–99)
Potassium: 3.4 mmol/L — ABNORMAL LOW (ref 3.5–5.1)
SODIUM: 137 mmol/L (ref 135–145)

## 2018-05-14 MED ORDER — FLEET ENEMA 7-19 GM/118ML RE ENEM: 1.0000 | ENEMA | Freq: Every day | RECTAL | 0 refills | Status: DC | PRN

## 2018-05-14 MED ORDER — DAPTOMYCIN IV (FOR PTA / DISCHARGE USE ONLY): 730.0000 mg | INTRAVENOUS | 0 refills | Status: DC

## 2018-05-14 MED ORDER — LIDOCAINE HCL 1 % IJ SOLN
INTRAMUSCULAR | Status: DC | PRN
  Administered 2018-05-14: 5 mL

## 2018-05-14 MED ORDER — SENNOSIDES-DOCUSATE SODIUM 8.6-50 MG PO TABS: 1.0000 | ORAL_TABLET | Freq: Two times a day (BID) | ORAL | Status: DC

## 2018-05-14 MED ORDER — LIDOCAINE HCL 1 % IJ SOLN
INTRAMUSCULAR | Status: AC
  Filled 2018-05-14: qty 20

## 2018-05-14 MED ORDER — POLYETHYLENE GLYCOL 3350 17 G PO PACK: 17.0000 g | PACK | Freq: Two times a day (BID) | ORAL | 0 refills | Status: AC

## 2018-05-14 MED ORDER — COLLAGENASE 250 UNIT/GM EX OINT: TOPICAL_OINTMENT | Freq: Every day | CUTANEOUS | 0 refills | Status: AC

## 2018-05-14 MED ORDER — POTASSIUM CHLORIDE CRYS ER 20 MEQ PO TBCR
20.0000 meq | EXTENDED_RELEASE_TABLET | Freq: Once | ORAL | Status: AC
  Administered 2018-05-14: 20 meq via ORAL
  Filled 2018-05-14: qty 1

## 2018-05-14 MED ORDER — ZINC OXIDE 40 % EX OINT: TOPICAL_OINTMENT | Freq: Four times a day (QID) | CUTANEOUS | 0 refills | Status: AC

## 2018-05-14 NOTE — Plan of Care (Signed)
Received a call from social work, [facility] declined to accept the patient as he is on daptomycin (too expensive for the facility).   Will await ID recommendations if patient can be placed on any other IV antibiotic choice.  I have requested Dr. Ilsa IhaSnyder to follow.  Will hold discharge.   Thad Rangeripudeep Nazim Kadlec M.D. [facility] 05/14/2018, 1:37 PM  Pager: [phone]

## 2018-05-14 NOTE — Discharge Summary (Signed)
Physician Discharge Summary   Patient ID: Wayne Buckley MRN: [id number] DOB/AGE: 10-07-1950 67 y.o.  Admit date: 05/02/2018 Discharge date: 05/14/2018  Primary Care Physician:  [name], MD   Recommendations for Outpatient Follow-up:  1. Follow up with PCP in 1-2 weeks 2. Please obtain BMP/CBC in one week  3. Patient needs good bowel regimen, enema every other day 4. Continue daptomycin renally dosed till May 31, 2018 5.  Needs to follow with urology for neurogenic bladder and indwelling Foley.  Home Health: None, being discharged to skilled nursing facility Equipment/Devices:   Discharge Condition: stable  CODE STATUS: FULL  Diet recommendation: Soft diet   Discharge Diagnoses:    . Sepsis ([place]) with MRSA bacteremia MRSA UTI . AKI (acute kidney injury) ([place]) with metabolic acidosis Ileus with abdominal distention . Essential hypertension . History of alcohol use . History of TBI with Cord compression ([place]) . History of DVT left lower extremity Pressure injury, POA to bilateral buttocks, deep tissue injury Urinary retention with neurogenic bladder   Consults:   Infectious disease Gastroenterology General surgery Cardiology Inpatient rehab   Allergies:  No Known Allergies   DISCHARGE MEDICATIONS: Allergies as of 05/14/2018   No Known Allergies     Medication List    STOP taking these medications   cefTRIAXone 1 g injection Commonly known as:  ROCEPHIN   hydrALAZINE 50 MG tablet Commonly known as:  APRESOLINE   hydrochlorothiazide 25 MG tablet Commonly known as:  HYDRODIURIL   nitrofurantoin (macrocrystal-monohydrate) 100 MG capsule Commonly known as:  MACROBID   potassium chloride SA 20 MEQ tablet Commonly known as:  K-DUR,KLOR-CON     TAKE these medications   acetaminophen 325 MG tablet Commonly known as:  TYLENOL Take 2 tablets (650 mg total) by mouth every 6 (six) hours as needed for mild pain. What changed:  Another  medication with the same name was removed. Continue taking this medication, and follow the directions you see here.   amLODipine 10 MG tablet Commonly known as:  NORVASC Take 1 tablet (10 mg total) by mouth daily.   apixaban 5 MG Tabs tablet Commonly known as:  ELIQUIS Take 1 tablet (5 mg total) by mouth 2 (two) times daily.   ascorbic acid 1000 MG tablet Commonly known as:  VITAMIN C Take 1 tablet (1,000 mg total) by mouth 2 (two) times daily.   bisacodyl 10 MG suppository Commonly known as:  DULCOLAX Place 1 suppository (10 mg total) rectally daily at 6 (six) AM.   carvedilol 12.5 MG tablet Commonly known as:  COREG Take 1 tablet (12.5 mg total) by mouth 2 (two) times daily with a meal.   collagenase ointment Commonly known as:  SANTYL Apply topically daily. Cleanse gluteal wounds with NS.  Apply Santyl to open areas.  Cover with NS moist 2x2 and foam dressings.  Peel back foam and change daily. Foam is changed every three days and PRN soilage. Start taking on:  05/15/2018   daptomycin  IVPB Commonly known as:  CUBICIN Inject 730 mg into the vein every other day for 18 days. Indication:  MRSA bacteremia Last Day of Therapy:  05/31/18 Labs - Once weekly:  CBC/D, BMP, and CPK Labs - Every other week:  ESR and CRP   liver oil-zinc oxide 40 % ointment Commonly known as:  DESITIN Apply topically 4 (four) times daily. Apply to sacrum, coccyx, buttocks.   polyethylene glycol packet Commonly known as:  MIRALAX / GLYCOLAX Take 17 g by  mouth 2 (two) times daily.   senna-docusate 8.6-50 MG tablet Commonly known as:  Senokot-S Take 1 tablet by mouth 2 (two) times daily.   sodium phosphate 7-19 GM/118ML Enem Place 133 mLs (1 enema total) rectally daily as needed for moderate constipation or severe constipation.   tamsulosin 0.4 MG Caps capsule Commonly known as:  FLOMAX Take 1 capsule (0.4 mg total) by mouth daily.            Home Infusion Instuctions  (From admission,  onward)         Start     Ordered   05/14/18 0000  Home infusion instructions [facility] May follow [place] Dosing Protocol; May administer Cathflo as needed to maintain patency of vascular access device.; Flushing of vascular access device: per [facility] Protocol: 0.9% NaCl pre/post medica...    Question Answer Comment  Instructions May follow [place] Dosing Protocol   Instructions May administer Cathflo as needed to maintain patency of vascular access device.   Instructions Flushing of vascular access device: per [facility] Protocol: 0.9% NaCl pre/post medication administration and prn patency; Heparin 100 u/ml, 10m for implanted ports and Heparin 10u/ml, 551mfor all other central venous catheters.   Instructions May follow [facility] Anaphylaxis Protocol for First Dose Administration in the home: 0.9% NaCl at 25-50 ml/hr to maintain IV access for protocol meds. Epinephrine 0.3 ml IV/IM PRN and Benadryl 25-50 IV/IM PRN s/s of anaphylaxis.   Instructions [facility] Infusion Coordinator (RN) to assist per patient IV care needs in the home PRN.      05/14/18 1007           Brief H and P: For complete details please refer to admission H and P, but in brief  Wayne Buckley 67 y.o.malewith medical history significant oftraumatic brain injury, central cord syndrome, neurogenic bladder with indwelling Foley catheter, bilateral leg weakness, prior DVT, hypertension, alcohol abuse. He presented secondary to fever. He was found to have MRSA in his urine prior to arrival. Blood cultures positive for MRSA and CT abdomen concerning for ileus.  Hospital Course:   Sepsis -Patient met criteria for sepsis at the time of admission, source is secondary to UTI and MRSA bacteremia  MRSA bacteremia, MRSA UTI -Patient empirically treated with vancomycin and cefepime. -Repeat blood cultures 11/15 showed no growth, leukocytosis still trending up -2D echo showed no evidence of vegetations -ID  was consulted, initially was placed on IV vancomycin, now changed to daptomycin due to rising creatinine.   - TEE was unsuccessful due to ?obstruction, esophagogram showed no stricture -Leukocytosis normalized today.  Tunneled midline will be placed, continue daptomycin renally dose till December 13/19  Acute kidney injury with metabolic acidosis  -Vancomycin was discontinued, patient was briefly placed on IV fluids with bicarb.  Creatinine function appears likely to have worsened due to acute urinary retention -Renal ultrasound did not show any obstruction or hydronephrosis.   -And has been improving, bicarb drip was discontinued, creatinine peaked at 2.7, follow closely  Ileus/abdominal distention -Patient has been followed by general surgery and gastroenterology, differentials including Ogilvie's syndrome, mechanical component - Esophagogram results discussed with GI, Dr. OuPaulita Fujita Per GI, patient will not be able to tolerate EGD with multiple comorbidities currently and no obvious stricture on the esophagram. -Regarding ileus, GI recommended enemas, advance diet, increase mobility, sit upright.  Patient is not a candidate for neostigmine or colonoscopy decompression.  Per GI, this is commonly seen in patients with TBI  and cord compression and will spontaneously resolve. -Continue soft solids.  Patient will need good bowel regimen with enema scheduled or every other day for constipation.  Urinary retention with neurogenic bladder -Per admission history patient with indwelling Foley catheter secondary to neurogenic bladder, TBI and cord compression - follow outpatient with urology and outpatient voiding trial, patient was supposed to have appointment today on 11/20.  Foley catheter changed.  Anemia, likely chronic Chronic, normocytic, baseline between 8-9.  Currently 7.6, no obvious bleeding.  Leukocytosis Worsening, likely due to #1 and #2, blood cultures been negative so far, afebrile  leukocytosis now normal.  History of DVT, left lower extremity, diagnosed in 02/2018 Continue Eliquis  History of TBI -Continue care at skilled nursing facility  Essential hypertension BP stable, continue amlodipine and Coreg  History of alcohol use Currently stable, no acute issues  Pressure injury POA, bilateral buttocks, deep tissue injury Wound care per nursing  Thrombocytosis -Likely reactive from sepsis, bacteremia, platelet count improving, 660 today  Hypokalemia Replaced   Day of Discharge S: No acute issues  BP 125/78 (BP Location: Left Arm)   Pulse 85   Temp 98.6 F (37 C) (Oral)   Resp 19   Ht _0  (1.753 m)   Wt 90.8 kg   SpO2 94%   BMI 29.56 kg/m   Physical Exam: General: Alert and awake oriented x3 not in any acute distress.  Dysarthria, HEENT: anicteric sclera, pupils reactive to light and accommodation CVS: S1-S2 clear no murmur rubs or gallops Chest: clear to auscultation bilaterally, no wheezing rales or rhonchi Abdomen: soft nontender, mildly distended, normal bowel sounds Extremities: no cyanosis, clubbing or edema noted bilaterally Neuro: no new deficits   The results of significant diagnostics from this hospitalization (including imaging, microbiology, ancillary and laboratory) are listed below for reference.      Procedures/Studies:  Ct Abdomen Pelvis Wo Contrast  Result Date: 05/02/2018 CLINICAL DATA:  Abdominal distention EXAM: CT ABDOMEN AND PELVIS WITHOUT CONTRAST TECHNIQUE: Multidetector CT imaging of the abdomen and pelvis was performed following the standard protocol without IV contrast. COMPARISON:  Same day radiographs of the abdomen and pelvis FINDINGS: Lower chest: Top-normal heart size. Trace pleural effusions with bibasilar atelectasis. Hepatobiliary: No focal liver abnormality is seen. No gallstones, gallbladder wall thickening, or biliary dilatation. Pancreas: Unremarkable. No pancreatic ductal dilatation or  surrounding inflammatory changes. Spleen: Normal in size without focal abnormality. Adrenals/Urinary Tract: Normal bilateral adrenal glands. No nephrolithiasis. Motion artifacts limit assessment at the level of the kidneys however there does appear to be a hypodense cystic lesion of the interpolar left kidney measuring up to 6 cm. No nephrolithiasis nor hydroureteronephrosis. Foley decompressed urinary bladder. Stomach/Bowel: Small hiatal hernia. Decompressed stomach and small bowel loops. Normal small bowel rotation. Moderate gaseous distention of the colon up to 9 cm at the level of the cecum with liquid stool noted within from cecum to distal descending colon likely representing large bowel dysmotility or ileus. Circumferential thickening surrounding a small stool ball is noted within the rectum. Mild stercoral proctitis is not excluded. Vascular/Lymphatic: IVC filter in place. Atherosclerosis of the aorta without aneurysm. No adenopathy. Reproductive: Normal size prostate. Other: Soft tissue anasarca. No free air or free fluid. Musculoskeletal: Status post ventral hernia repair. Small amount of fat in the right inguinal canal. IMPRESSION: 1. Moderate gaseous distention of the colon up to 9 cm at the level of the cecum with liquid stool noted within from cecum to distal descending colon. Findings may represent a  large bowel dysmotility or ileus. Mechanical source of obstruction is identified. 2. Circumferential thickening surrounding a small stool ball within the rectum. Mild stercoral proctitis could have this appearance. 3. 6 cm hypodense cystic lesion of the interpolar left kidney, likely a simple cyst however further characterization is limited due to motion. 4. IVC filter in place. 5. Mild soft tissue anasarca. Electronically Signed   By: Ashley Royalty M.D.   On: 05/02/2018 16:46   Dg Abdomen 1 View  Result Date: 05/02/2018 CLINICAL DATA:  67 y/o  M; abdominal distention. EXAM: ABDOMEN - 1 VIEW  COMPARISON:  05/02/2018 CT abdomen and pelvis. FINDINGS: Diffuse air-filled distention of the large bowel. Upper abdomen is excluded from the field of view. IVC filter in situ. Advanced degenerative changes of the visible lumbar spine. Lower abdominal wall mesh hernia repair anchors noted. IMPRESSION: Diffuse air-filled distention of the large bowel, possibly chronic large bowel dysmotility or ileus. Electronically Signed   By: Kristine Garbe M.D.   On: 05/02/2018 20:39   Dg Esophagus  Result Date: 05/09/2018 CLINICAL DATA:  Dysphagia EXAM: ESOPHOGRAM/BARIUM SWALLOW TECHNIQUE: Single contrast examination was performed using  thin barium. FLUOROSCOPY TIME:  Fluoroscopy Time:  2 minutes 24 seconds Radiation Exposure Index (if provided by the fluoroscopic device): Number of Acquired Spot Images: 0 COMPARISON:  None. FINDINGS: Limited study due to patient's condition. There are no fixed strictures visualized. No reflux noted during the study. The patient had difficulty swallowing the 13 mm barium tablet which remains present in the back of the throat for several swallows, finally passing into the esophagus. This sticks in the mid to distal esophagus despite the lack of visible stricture in this area. IMPRESSION: No visible stricture seen, but the 13 mm barium tablet sticks in the mid to distal esophagus. May consider further evaluation with endoscopy. Electronically Signed   By: Rolm Baptise M.D.   On: 05/09/2018 10:37   US Renal  Result Date: 05/11/2018 CLINICAL DATA:  Urinary retention EXAM: RENAL / URINARY TRACT ULTRASOUND COMPLETE COMPARISON:  CT of the abdomen and pelvis on 05/02/2018 FINDINGS: Right Kidney: Renal measurements: 16.3 x 6.6 x 7.4 cm = volume: 415 mL . Echogenicity within normal limits. No mass or hydronephrosis visualized. Left Kidney: Renal measurements: 15.4 x 8.1 x 7.7 cm = volume: 500 mL. Echogenicity within normal limits. No mass or hydronephrosis visualized. Simple cyst of  the interpolar left kidney measures roughly 4.7 x 5.9 x 3.9 cm. Bladder: The bladder is decompressed by a Foley catheter. IMPRESSION: Unremarkable renal ultrasound. Electronically Signed   By: Aletta Edouard M.D.   On: 05/11/2018 14:35   Dg Chest Port 1 View  Result Date: 05/02/2018 CLINICAL DATA:  Fever EXAM: PORTABLE CHEST 1 VIEW COMPARISON:  03/03/2018 FINDINGS: The heart size and mediastinal contours are within normal limits. Both lungs are clear. The visualized skeletal structures are unremarkable. IMPRESSION: No active disease. Electronically Signed   By: Franchot Gallo M.D.   On: 05/02/2018 14:35   Dg Abd 2 Views  Result Date: 05/09/2018 CLINICAL DATA:  Abdominal distention. EXAM: ABDOMEN - 2 VIEW COMPARISON:  05/04/2018, 05/02/2018.  CT 05/02/2018. FINDINGS: IVC filter noted tilted over the upper portion of the inferior vena cava in unchanged position. Distended loops of colon again noted. Colon is slightly less distended on today's exam. Several nonspecific air-filled loops of small bowel noted. No free air is identified. Degenerative changes lumbar spine. IMPRESSION: Dilated loops of colon again noted. Colon is slightly less distended on today's  exam. Electronically Signed   By: Marcello Moores  Register   On: 05/09/2018 09:58   Dg Abd Portable 1v  Result Date: 05/04/2018 CLINICAL DATA:  Follow-up ileus EXAM: PORTABLE ABDOMEN - 1 VIEW COMPARISON:  05/02/2018 FINDINGS: Distended colon is again noted and stable consistent with generalized ileus. No definitive obstructive changes are seen. Postsurgical changes in the low pelvis are noted. Degenerative changes of the lumbar spine are seen. No free air is noted. IMPRESSION: Stable gaseous dilatation of the colon consistent with ileus. Electronically Signed   By: Inez Catalina M.D.   On: 05/04/2018 09:40       LAB RESULTS: Basic Metabolic Panel: Recent Labs  Lab 05/10/18 0642  05/13/18 0629 05/14/18 0719  NA 133*   < > 136 137  K 4.7   < >  3.2* 3.4*  CL 109   < > 101 101  CO2 17*   < > 28 28  GLUCOSE 103*   < > 103* 89  BUN 19   < > 22 23  CREATININE 2.75*   < > 2.74* 2.70*  CALCIUM 8.5*   < > 7.8* 7.9*  MG 1.9  --   --   --    < > = values in this interval not displayed.   Liver Function Tests: No results for input(s): AST, ALT, ALKPHOS, BILITOT, PROT, ALBUMIN in the last 168 hours. No results for input(s): LIPASE, AMYLASE in the last 168 hours. No results for input(s): AMMONIA in the last 168 hours. CBC: Recent Labs  Lab 05/13/18 0503 05/14/18 0719  WBC 11.7* 9.7  HGB 8.0* 7.6*  HCT 25.1* 24.5*  MCV 86.3 86.9  PLT 768* 660*   Cardiac Enzymes: Recent Labs  Lab 05/11/18 0615  CKTOTAL 26*   BNP: Invalid input(s): POCBNP CBG: Recent Labs  Lab 05/11/18 2027  GLUCAP 111*      Disposition and Follow-up: Discharge Instructions    Home infusion instructions [facility] May follow [place] Dosing Protocol; May administer Cathflo as needed to maintain patency of vascular access device.; Flushing of vascular access device: per [facility] Protocol: 0.9% NaCl pre/post medica...   Complete by:  As directed    Instructions:  May follow [place] Dosing Protocol   Instructions:  May administer Cathflo as needed to maintain patency of vascular access device.   Instructions:  Flushing of vascular access device: per [facility] Protocol: 0.9% NaCl pre/post medication administration and prn patency; Heparin 100 u/ml, 15m for implanted ports and Heparin 10u/ml, 574mfor all other central venous catheters.   Instructions:  May follow [facility] Anaphylaxis Protocol for First Dose Administration in the home: 0.9% NaCl at 25-50 ml/hr to maintain IV access for protocol meds. Epinephrine 0.3 ml IV/IM PRN and Benadryl 25-50 IV/IM PRN s/s of anaphylaxis.   Instructions:  AdBenhamnfusion Coordinator (RN) to assist per patient IV care needs in the home PRN.       DISPOSITION: Skilled nursing facility   DISCHARGE  FOLLOW-UP  Contact information for follow-up providers    SnCarlyle BasquesMD Follow up.   Specialty:  Infectious Diseases Why:  12/18 @ 3pm. If you are not able to make this appointment please call to reschedule.  Contact information: [address] [phone]          Contact information for after-discharge care    Destination    [facility] Preferred SNF .   Service:  Skilled Nursing  Contact information: [place] [phone]                   Time coordinating discharge:  40 minutes  Signed:   Estill Cotta M.D. [facility] 05/14/2018, 12:16 PM Pager: [phone]

## 2018-05-14 NOTE — Procedures (Signed)
Placement of right jugular tunneled central line (Powerline).  Tip in lower SVC and ready to use. Minimal blood loss and no immediate complication.

## 2018-05-14 NOTE — Clinical Social Work Note (Addendum)
[facility], however SNF unable to accept patient due to cost of daptomycin ($1,000 per dose per admissions director). Per MD's recommendation, call made to Steward DroneBrenda, nurse case manager to check into LTAC for patient. Per Steward DroneBrenda, the patient must meet certain criteria for LTAC, as an ICU stay this admission and patient did not have an ICU stay. CSW got more detailed information regarding how Medicare pays a skilled nursing facility and shared this information with Mrs. Auerbach. She understands that attending MD waiting on ID recommendations.    CSW will continue to follow and assist as needed with d/c disposition.  Genelle BalVanessa Torrian Canion, MSW, LCSW Licensed Clinical Social Worker Clinical Social Work Department [facility] [phone]

## 2018-05-14 NOTE — Progress Notes (Addendum)
   [facility]  Assessment and Plan: Patient in need of venous access.   Tunneled central catheter placement  ______________________________________________________________________   History of Present Illness: Wayne Buckley is a 67 y.o. male   MRSA bacteremia Olgivie's Syndrome  ETOH abuse Traumatic brain injury  Need ro long term antibiotics To be discharged to SNF soon  ON ELIQUIS   Allergies and medications reviewed.   Review of Systems: A 12 point ROS discussed and pertinent positives are indicated in the HPI above.  All other systems are negative.  Vital Signs: BP 125/78 (BP Location: Left Arm)   Pulse 85   Temp 98.6 F (37 C) (Oral)   Resp 19   Ht 5\' 9"  (1.753 m)   Wt 200 lb 2.8 oz (90.8 kg)   SpO2 94%   BMI 29.56 kg/m   Physical Exam  Cardiovascular: Normal rate and regular rhythm.  Pulmonary/Chest: Effort normal.  Skin: Skin is warm and dry.  Psychiatric:  Able to tell me DOB and place But was confused with other questions Discussed with wife and consented via phone   Vitals reviewed.  Imaging reviewed.   Labs:  COAGS: Recent Labs    02/25/18 1413  05/10/18 0642 05/11/18 0615 05/12/18 0628 05/13/18 0503  INR 1.16  --   --   --   --   --   APTT  --    < > 54* 53* 51* 54*   < > = values in this interval not displayed.    BMP: Recent Labs    05/11/18 0615 05/12/18 0628 05/13/18 0629 05/14/18 0719  NA 133* 136 136 137  K 3.2* 3.1* 3.2* 3.4*  CL 104 105 101 101  CO2 20* 22 28 28   GLUCOSE 107* 95 103* 89  BUN 26* 26* 22 23  CALCIUM 7.8* 7.8* 7.8* 7.9*  CREATININE 3.44* 2.96* 2.74* 2.70*  GFRNONAA 17* 20* 22* 23*  GFRAA 20* 24* 26* 27*   Tunneled central catheter placement scheduled for today Discussed risks and benefits including but not limited to Infection; bleeding; vessel damage;..with wife via phone Consent signed and in IR  Electronically Signed: Robet LeuPamela A Sabra Sessler, PA-C 05/14/2018, 10:57 AM   I  spent a total of 15 minutes in face to face in clinical consultation, greater than 50% of which was counseling/coordinating care for venous access.Patient ID: Wayne Buckley, male   DOB: 04/26/1951, 67 y.o.   MRN: [id number]

## 2018-05-14 NOTE — Care Management (Addendum)
1643 05-14-18 Tomi BambergerBrenda Graves-Bigelow, RN,BSN [phone] CM did call Building services engineerZack Assistant Director with CSW, to make him aware that patient was not transitioned to SNF based on Cost of medications. CM did leave a Engineer, technical salesvoicemail for Zack. CM will continue to monitor for additional needs.    [phone] Tomi BambergerBrenda Graves-Bigelow, RN,BSN [phone] Patient is not a candidate for LTAC- Revenue Code below 200. CM will make MD and CSW aware. Gala LewandowskyGraves-Bigelow, Thorvald Orsino Kaye, [place]RN,BSN [phone]   [phone] 11-07-25-17 Case Manager received call from WagenerVanessa CSW 2M stating that patient [facility], however [facility] relayed that Daptomycin is expensive and will be $1000.00 per dose. Per Erie NoeVanessa, MD Rai wanted Case Manager to explore LTAC avenue. Patient has not had an ICU stay and has not been stepdown level of care.  CM did reach out to [facility] and [facility]. CM [facility]iaison with [facility] to see if possible candidate- awaiting call back for confirmation. Gala LewandowskyGraves-Bigelow, Latrelle Bazar Kaye, RN,BSN Case Manager [phone]

## 2018-05-15 DIAGNOSIS — R140 Abdominal distension (gaseous): Secondary | ICD-10-CM

## 2018-05-15 LAB — BASIC METABOLIC PANEL
Anion gap: 8 (ref 5–15)
BUN: 25 mg/dL — ABNORMAL HIGH (ref 8–23)
CHLORIDE: 102 mmol/L (ref 98–111)
CO2: 28 mmol/L (ref 22–32)
CREATININE: 2.45 mg/dL — AB (ref 0.61–1.24)
Calcium: 8.3 mg/dL — ABNORMAL LOW (ref 8.9–10.3)
GFR calc non Af Amer: 26 mL/min — ABNORMAL LOW (ref >60)
GFR, EST AFRICAN AMERICAN: 30 mL/min — AB (ref >60)
Glucose, Bld: 91 mg/dL (ref 70–99)
Potassium: 4.1 mmol/L (ref 3.5–5.1)
SODIUM: 138 mmol/L (ref 135–145)

## 2018-05-15 MED ORDER — DAPTOMYCIN IV (FOR PTA / DISCHARGE USE ONLY): 730.0000 mg | INTRAVENOUS | 0 refills | Status: DC

## 2018-05-15 MED ORDER — SODIUM CHLORIDE 0.9 % IV SOLN
730.0000 mg | INTRAVENOUS | Status: AC
  Administered 2018-05-16 – 2018-05-30 (×16): 730 mg via INTRAVENOUS
  Filled 2018-05-15 (×17): qty 14.6

## 2018-05-15 NOTE — Progress Notes (Signed)
PHARMACY CONSULT NOTE FOR:  OUTPATIENT  PARENTERAL ANTIBIOTIC THERAPY (OPAT)  Indication: MRSA bacteremia Regimen: Daptomycin dose adjusted to 730 mg every 24 hours End date: 05/31/18  IV antibiotic discharge orders are pended. To discharging provider:  please sign these orders via discharge navigator,  Select New Orders & click on the button choice - Manage This Unsigned Work.     Thank you for allowing pharmacy to be a part of this patient's care.  Noah Delaineuth Jashad Depaula, RPh Clinical Pharmacist [phone] 05/15/2018, 10:46 AM

## 2018-05-15 NOTE — Progress Notes (Signed)
PROGRESS NOTE  Wayne Buckley ZOX:[id number] DOB: 05-Sep-1950 DOA: 05/02/2018 PCP: [name], MD  HPI/Recap of past 24 hours: For complete details please refer to admission H and P, but in brief  Wayne Buckley a 67 y.o.malewith medical history significant oftraumatic brain injury, central cord syndrome, neurogenic bladder with indwelling Foley catheter, bilateral leg weakness, prior DVT, hypertension, alcohol abuse. He presented secondary to fever. He was found to have MRSA in his urine prior to arrival. Blood cultures positive for MRSA and CT abdomen concerning for ileus.  05/15/18: no new complaints. No events overnight. Awaiting placement to SNF.  Assessment/Plan: Principal Problem:   Ogilvie's syndrome Active Problems:   Alcohol abuse   AKI (acute kidney injury) ([place])   Central cord syndrome (HCC)   Essential hypertension   Sepsis (HCC)   Chronic anemia   Hyponatremia   Sacral ulcer (HCC)   History of DVT (deep vein thrombosis)   Traumatic brain injury ([place])   MRSA bacteremia   Bacteremia   Ileus (HCC)   Sepsis -Patient met criteria for sepsis at the time of admission, source is secondary to UTI and MRSA bacteremia  MRSA bacteremia, MRSA UTI -Patient empirically treated with vancomycin and cefepime. -Repeat blood cultures 11/15 showed no growth, leukocytosis still trending up -2D echo showed no evidence of vegetations -ID was consulted, initially was placed on IV vancomycin, now changed to daptomycin due to rising creatinine.  - TEE was unsuccessful due to ?obstruction, esophagogram showed no stricture -Leukocytosis normalized today.  Tunneled midline will be placed, continue daptomycin renally dose till December 13/19  Acute kidney injury with metabolic acidosis  -Vancomycin was discontinued, patient was briefly placed on IV fluids with bicarb.  Creatinine function appears likely to have worsened due to acute urinary retention -Renal ultrasound did not  show any obstruction or hydronephrosis. -And has been improving, bicarb drip was discontinued, creatinine peaked at 2.7, follow closely  Ileus/abdominal distention -Patient has been followed by general surgery and gastroenterology, differentials including Ogilvie's syndrome, mechanical component - Esophagogram results discussed with GI, Dr. [name]. Per GI, patient will not be able to tolerate EGD with multiple comorbidities currently and no obvious stricture on the esophagram. -Regarding ileus, GI recommended enemas, advance diet, increase mobility, sit upright. Patient is not a candidate for neostigmine or colonoscopy decompression. Per GI, this is commonly seen in patients with TBI and cord compression and will spontaneously resolve. -Continue soft solids.  Patient will need good bowel regimen with enema scheduled or every other day for constipation.  Urinary retention with neurogenic bladder -Per admission history patient with indwelling Foley catheter secondary to neurogenic bladder, TBI and cord compression - follow outpatient with urology and outpatient voiding trial, patient was supposed to have appointment today on 11/20. Foley catheter changed.  Anemia, likely chronic Chronic, normocytic, baseline between 8-9.  Currently 7.6, no obvious bleeding.  Leukocytosis Worsening, likely due to #1 and #2, blood cultures been negative so far, afebrile leukocytosis now normal.  History of DVT, left lower extremity, diagnosed in 02/2018 Continue Eliquis  History of TBI -Continue care at skilled nursing facility  Essential hypertension BP stable, continue amlodipine and Coreg  History of alcohol use Currently stable, no acute issues  Pressure injury POA, bilateral buttocks, deep tissue injury Wound care per nursing  Thrombocytosis -Likely reactive from sepsis, bacteremia, platelet count improving, 660 today  Hypokalemia Replaced  Consults:   Infectious  disease Gastroenterology General surgery Cardiology Inpatient rehab   Allergies:  No Known  Allergies  Full code    Objective: Vitals:   05/14/18 1706 05/14/18 2210 05/15/18 0528 05/15/18 0847  BP: 129/79 126/74 131/71 130/84  Pulse: 88 86 79 85  Resp: 19 20  18   Temp: 98.4 F (36.9 C) (!) 100.9 F (38.3 C)  98.2 F (36.8 C)  TempSrc: Oral Oral  Oral  SpO2: 95% 93% 94% 97%  Weight:      Height:        Intake/Output Summary (Last 24 hours) at 05/15/2018 1555 Last data filed at 05/14/2018 1900 Gross per 24 hour  Intake 0 ml  Output -  Net 0 ml   Filed Weights   05/04/18 2314 05/08/18 2012 05/09/18 2140  Weight: 90.5 kg 90.4 kg 90.8 kg    Exam:  . General: 67 y.o. year-old male well developed well nourished in no acute distress.  Alert and oriented x3. . Cardiovascular: Regular rate and rhythm with no rubs or gallops.  No thyromegaly or JVD noted.   Marland Kitchen Respiratory: Clear to auscultation with no wheezes or rales. Good inspiratory effort. . Abdomen: Soft nontender nondistended with normal bowel sounds x4 quadrants. . Musculoskeletal: No lower extremity edema. 2/4 pulses in all 4 extremities. . Skin: No ulcerative lesions noted or rashes . Psychiatry: Mood is appropriate for condition and setting   Data Reviewed: CBC: Recent Labs  Lab 05/10/18 0642 05/11/18 0615 05/12/18 0628 05/13/18 0503 05/14/18 0719  WBC 23.1* 17.1* 12.9* 11.7* 9.7  HGB 8.6* 7.4* 7.6* 8.0* 7.6*  HCT 28.2* 23.1* 24.6* 25.1* 24.5*  MCV 89.2 88.2 87.5 86.3 86.9  PLT 915* 701* 779* 768* 093*   Basic Metabolic Panel: Recent Labs  Lab 05/10/18 0642 05/11/18 0615 05/12/18 0628 05/13/18 0629 05/14/18 0719 05/15/18 0533  NA 133* 133* 136 136 137 138  K 4.7 3.2* 3.1* 3.2* 3.4* 4.1  CL 109 104 105 101 101 102  CO2 17* 20* 22 28 28 28   GLUCOSE 103* 107* 95 103* 89 91  BUN 19 26* 26* 22 23 25*  CREATININE 2.75* 3.44* 2.96* 2.74* 2.70* 2.45*  CALCIUM 8.5* 7.8* 7.8* 7.8* 7.9* 8.3*   MG 1.9  --   --   --   --   --    GFR: Estimated Creatinine Clearance: 32.1 mL/min (A) (by C-G formula based on SCr of 2.45 mg/dL (H)). Liver Function Tests: No results for input(s): AST, ALT, ALKPHOS, BILITOT, PROT, ALBUMIN in the last 168 hours. No results for input(s): LIPASE, AMYLASE in the last 168 hours. No results for input(s): AMMONIA in the last 168 hours. Coagulation Profile: No results for input(s): INR, PROTIME in the last 168 hours. Cardiac Enzymes: Recent Labs  Lab 05/11/18 0615  CKTOTAL 26*   BNP (last 3 results) No results for input(s): PROBNP in the last 8760 hours. HbA1C: No results for input(s): HGBA1C in the last 72 hours. CBG: Recent Labs  Lab 05/11/18 2027  GLUCAP 111*   Lipid Profile: No results for input(s): CHOL, HDL, LDLCALC, TRIG, CHOLHDL, LDLDIRECT in the last 72 hours. Thyroid Function Tests: No results for input(s): TSH, T4TOTAL, FREET4, T3FREE, THYROIDAB in the last 72 hours. Anemia Panel: No results for input(s): VITAMINB12, FOLATE, FERRITIN, TIBC, IRON, RETICCTPCT in the last 72 hours. Urine analysis:    Component Value Date/Time   COLORURINE YELLOW 05/06/2018 1609   APPEARANCEUR TURBID (A) 05/06/2018 1609   LABSPEC 1.012 05/06/2018 1609   PHURINE 5.0 05/06/2018 1609   GLUCOSEU NEGATIVE 05/06/2018 1609   HGBUR NEGATIVE 05/06/2018 1609  BILIRUBINUR NEGATIVE 05/06/2018 [place] 05/06/2018 1609   PROTEINUR NEGATIVE 05/06/2018 1609   UROBILINOGEN 0.2 05/08/2012 0314   NITRITE NEGATIVE 05/06/2018 1609   LEUKOCYTESUR NEGATIVE 05/06/2018 1609   Sepsis Labs: @LABRCNTIP (procalcitonin:4,lacticidven:4)  ) Recent Results (from the past 240 hour(s))  Culture, blood (routine x 2)     Status: None   Collection Time: 05/09/18  8:10 PM  Result Value Ref Range Status   Specimen Description BLOOD LEFT ARM  Final   Special Requests   Final    BOTTLES DRAWN AEROBIC ONLY Blood Culture results may not be optimal due to an  inadequate volume of blood received in culture bottles   Culture   Final    NO GROWTH 5 DAYS Performed at [facility], [place] [address], [place], [place] [address]    Report Status 05/14/2018 FINAL  Final  Culture, blood (routine x 2)     Status: None   Collection Time: 05/09/18  8:20 PM  Result Value Ref Range Status   Specimen Description BLOOD BLOOD LEFT FOREARM  Final   Special Requests   Final    BOTTLES DRAWN AEROBIC ONLY Blood Culture results may not be optimal due to an inadequate volume of blood received in culture bottles   Culture   Final    NO GROWTH 5 DAYS Performed at [facility], [place] [address], [place], [place] [address]    Report Status 05/14/2018 FINAL  Final      Studies: Ir Fluoro Guide Cv Line Right  Result Date: 05/14/2018 INDICATION: 68 year old with bacteremia and needs long-term IV antibiotics. Plan for tunneled central line placement. EXAM: FLUOROSCOPIC AND ULTRASOUND GUIDED PLACEMENT OF A TUNNELED CENTRAL VENOUS CATHETER Physician: Stephan Minister. Henn, MD FLUOROSCOPY TIME:  30 seconds, 1 mGy MEDICATIONS: None ANESTHESIA/SEDATION: None PROCEDURE: Informed consent was obtained for placement of a tunneled central venous catheter. The patient was placed supine on the interventional table. Ultrasound confirmed a patent right internal jugularvein. Ultrasound images were obtained for documentation. The right neck was prepped and draped in a sterile fashion. The right neck was anesthetized with 1% lidocaine. Maximal barrier sterile technique was utilized including caps, mask, sterile gowns, sterile gloves, sterile drape, hand hygiene and skin antiseptic. A small incision was made with #11 blade scalpel. A 21 gauge needle directed into the right internal jugular vein with ultrasound guidance. A micropuncture dilator set was placed. A single lumen Powerline catheter was selected. The skin below the right clavicle was anesthetized and a small incision was made with an #11  blade scalpel. A subcutaneous tunnel was formed to the vein dermatotomy site. The catheter was brought through the tunnel. The vein dermatotomy site was dilated to accommodate a peel-away sheath. The catheter was placed through the peel-away sheath and directed into the central venous structures. The tip of the catheter was placed at the superior cavoatrial junction with fluoroscopy. Fluoroscopic images were obtained for documentation. Lumen aspirated and flushed well. Catheter was flushed with normal saline. The vein dermatotomy site was closed using Dermabond. The catheter was secured to the skin using Prolene suture. FINDINGS: Catheter tip at the superior cavoatrial junction. COMPLICATIONS: None IMPRESSION: Successful placement of a right jugular tunneled central venous catheter using ultrasound and fluoroscopic guidance. Electronically Signed   By: Markus Daft M.D.   On: 05/14/2018 18:11   Ir US Guide Vasc Access Right  Result Date: 05/14/2018 INDICATION: 67 year old with bacteremia and needs long-term IV antibiotics. Plan for tunneled central  line placement. EXAM: FLUOROSCOPIC AND ULTRASOUND GUIDED PLACEMENT OF A TUNNELED CENTRAL VENOUS CATHETER Physician: Stephan Minister. Henn, MD FLUOROSCOPY TIME:  30 seconds, 1 mGy MEDICATIONS: None ANESTHESIA/SEDATION: None PROCEDURE: Informed consent was obtained for placement of a tunneled central venous catheter. The patient was placed supine on the interventional table. Ultrasound confirmed a patent right internal jugularvein. Ultrasound images were obtained for documentation. The right neck was prepped and draped in a sterile fashion. The right neck was anesthetized with 1% lidocaine. Maximal barrier sterile technique was utilized including caps, mask, sterile gowns, sterile gloves, sterile drape, hand hygiene and skin antiseptic. A small incision was made with #11 blade scalpel. A 21 gauge needle directed into the right internal jugular vein with ultrasound guidance. A  micropuncture dilator set was placed. A single lumen Powerline catheter was selected. The skin below the right clavicle was anesthetized and a small incision was made with an #11 blade scalpel. A subcutaneous tunnel was formed to the vein dermatotomy site. The catheter was brought through the tunnel. The vein dermatotomy site was dilated to accommodate a peel-away sheath. The catheter was placed through the peel-away sheath and directed into the central venous structures. The tip of the catheter was placed at the superior cavoatrial junction with fluoroscopy. Fluoroscopic images were obtained for documentation. Lumen aspirated and flushed well. Catheter was flushed with normal saline. The vein dermatotomy site was closed using Dermabond. The catheter was secured to the skin using Prolene suture. FINDINGS: Catheter tip at the superior cavoatrial junction. COMPLICATIONS: None IMPRESSION: Successful placement of a right jugular tunneled central venous catheter using ultrasound and fluoroscopic guidance. Electronically Signed   By: Markus Daft M.D.   On: 05/14/2018 18:11    Scheduled Meds: . amLODipine  10 mg Oral Daily  . apixaban  5 mg Oral BID  . bisacodyl  10 mg Rectal Daily  . carvedilol  12.5 mg Oral BID WC  . collagenase   Topical Daily  . liver oil-zinc oxide   Topical QID  . polyethylene glycol  17 g Oral BID  . senna-docusate  1 tablet Oral BID    Continuous Infusions: . [START ON 05/16/2018] DAPTOmycin (CUBICIN)  IV       LOS: 13 days     Kayleen Memos, MD [facility] Pager [phone]  If 7PM-7AM, please contact night-coverage [email or web address] Password [facility] 05/15/2018, 3:55 PM

## 2018-05-16 LAB — CBC
HEMATOCRIT: 26.1 % — AB (ref 39.0–52.0)
Hemoglobin: 8.3 g/dL — ABNORMAL LOW (ref 13.0–17.0)
MCH: 27.3 pg (ref 26.0–34.0)
MCHC: 31.8 g/dL (ref 30.0–36.0)
MCV: 85.9 fL (ref 80.0–100.0)
PLATELETS: 643 10*3/uL — AB (ref 150–400)
RBC: 3.04 MIL/uL — ABNORMAL LOW (ref 4.22–5.81)
RDW: 14.5 % (ref 11.5–15.5)
WBC: 10 10*3/uL (ref 4.0–10.5)
nRBC: 0 % (ref 0.0–0.2)

## 2018-05-16 LAB — BASIC METABOLIC PANEL
ANION GAP: 10 (ref 5–15)
BUN: 24 mg/dL — ABNORMAL HIGH (ref 8–23)
CALCIUM: 8.5 mg/dL — AB (ref 8.9–10.3)
CO2: 26 mmol/L (ref 22–32)
CREATININE: 2.28 mg/dL — AB (ref 0.61–1.24)
Chloride: 101 mmol/L (ref 98–111)
GFR, EST AFRICAN AMERICAN: 33 mL/min — AB (ref >60)
GFR, EST NON AFRICAN AMERICAN: 28 mL/min — AB (ref >60)
Glucose, Bld: 90 mg/dL (ref 70–99)
Potassium: 4.1 mmol/L (ref 3.5–5.1)
SODIUM: 137 mmol/L (ref 135–145)

## 2018-05-16 NOTE — Progress Notes (Signed)
PROGRESS NOTE  Wayne Buckley DGL:[id number] DOB: [birth year] DOA: 05/02/2018 PCP: Wayne Cruel, MD  HPI/Recap of past 24 hours: For complete details please refer to admission H and P, but in brief  [name] a 67 y.o.malewith medical history significant oftraumatic brain injury, central cord syndrome, neurogenic bladder with indwelling Foley catheter, bilateral leg weakness, prior DVT, hypertension, alcohol abuse. He presented secondary to fever. He was found to have MRSA in his urine prior to arrival. Blood cultures positive for MRSA and CT abdomen concerning for ileus.  05/16/2018: Patient seen and examined at his bedside.  No acute events overnight.  No new complaints.  Awaiting placement to SNF.  Vital signs reviewed and are unremarkable. Denies abd pain or nausea; states he just had a bowel movement.   Assessment/Plan: Principal Problem:   Ogilvie's syndrome Active Problems:   Alcohol abuse   AKI (acute kidney injury) ([place])   Central cord syndrome (HCC)   Essential hypertension   Sepsis (HCC)   Chronic anemia   Hyponatremia   Sacral ulcer (HCC)   History of DVT (deep vein thrombosis)   Traumatic brain injury ([place])   MRSA bacteremia   Bacteremia   Ileus (HCC)   Abdominal distension   Sepsis secondary to UTI and MRSA bacteremia -Patient met criteria for sepsis at the time of admission, source is secondary to UTI and MRSA bacteremia  MRSA bacteremia, MRSA UTI -Patient empirically treated with vancomycin and cefepime. -Repeat blood cultures 11/15 showed no growth -2D echo showed no evidence of vegetations -ID was consulted, initially was placed on IV vancomycin, now changed to daptomycin due to rising creatinine.  - TEE was unsuccessful due to ?obstruction, esophagogram showed no stricture -Tunneled midline placed, continue daptomycin renally dose till December 13/19  Resolving acute kidney injury with metabolic acidosis  -Vancomycin was discontinued,  patient was briefly placed on IV fluids with bicarb. -Renal ultrasound did not show any obstruction or hydronephrosis. -Cr has been improving, bicarb drip was discontinued, creatinine peaked at 2.7  Ileus/abdominal distention -Patient has been followed by general surgery and gastroenterology, differentials including Ogilvie's syndrome, mechanical component - Esophagogram results discussed with GI, Dr. Paulita Buckley. Per GI, patient will not be able to tolerate EGD with multiple comorbidities currently and no obvious stricture on the esophagram. -Regarding ileus, GI recommended enemas, advance diet, increase mobility, sit upright. Patient is not a candidate for neostigmine or colonoscopy decompression. Per GI, this is commonly seen in patients with TBI and cord compression and will spontaneously resolve. -Continue soft solids.  Patient will need good bowel regimen with enema scheduled or every other day for constipation. Had BM today 05/16/18.  Urinary retention with neurogenic bladder -Per admission history patient with indwelling Foley catheter secondary to neurogenic bladder, TBI and cord compression - follow outpatient with urology and outpatient voiding trial, patient was supposed to have appointment on 11/20. Foley catheter changed.  Persistent drop in Hg in the setting of normocytic Anemia, likely chronic Normal mcv Obtain iron studies Baseline between 8-9.  hg 7.6, no obvious bleeding. Obtain CBC today  Resolved Leukocytosis  History of DVT, left lower extremity, diagnosed in 02/2018 Continue Eliquis  History of TBI -Continue care at skilled nursing facility  Essential hypertension BP stable, continue amlodipine and Coreg  History of alcohol use Currently stable, no acute issues  Pressure injury POA, bilateral buttocks, deep tissue injury Wound care per nursing  Thrombocytosis -Likely reactive from sepsis, bacteremia, platelet count improving, 660 -Obtain CBC  today  and tomorrow  Hypokalemia Replaced  Consults:   Infectious disease Gastroenterology General surgery Cardiology Inpatient rehab   Allergies:  No Known Allergies  Full code    Objective: Vitals:   05/15/18 1740 05/15/18 2137 05/16/18 0530 05/16/18 0953  BP: 116/83 129/80 128/81 137/83  Pulse: 94 89 83 83  Resp: 20 16 16 19   Temp:  99.7 F (37.6 C) 97.8 F (36.6 C) 98.3 F (36.8 C)  TempSrc:  Oral Oral Oral  SpO2: 96% 93% 92% 94%  Weight:      Height:        Intake/Output Summary (Last 24 hours) at 05/16/2018 1253 Last data filed at 05/16/2018 0900 Gross per 24 hour  Intake 0 ml  Output 2760 ml  Net -2760 ml   Filed Weights   05/04/18 2314 05/08/18 2012 05/09/18 2140  Weight: 90.5 kg 90.4 kg 90.8 kg    Exam:  . General: 67 y.o. year-old male WD WN NAD A&O x 3 . Cardiovascular: RRR no rubs or gallops. No JVD or thyromegaly\ . Respiratory: Clear to auscultation with no wheezes or rales. Good inspiratory effort. . Abdomen: Soft nontender nondistended with normal bowel sounds x4 quadrants. . Musculoskeletal: Trace lower extremity edema. 2/4 pulses in all 4 extremities. . Skin:Pressure injury, poa on bilateral buttocks . Psychiatry: Mood is appropriate for condition and setting   Data Reviewed: CBC: Recent Labs  Lab 05/10/18 0642 05/11/18 0615 05/12/18 0628 05/13/18 0503 05/14/18 0719  WBC 23.1* 17.1* 12.9* 11.7* 9.7  HGB 8.6* 7.4* 7.6* 8.0* 7.6*  HCT 28.2* 23.1* 24.6* 25.1* 24.5*  MCV 89.2 88.2 87.5 86.3 86.9  PLT 915* 701* 779* 768* 419*   Basic Metabolic Panel: Recent Labs  Lab 05/10/18 0642 05/11/18 0615 05/12/18 0628 05/13/18 0629 05/14/18 0719 05/15/18 0533  NA 133* 133* 136 136 137 138  K 4.7 3.2* 3.1* 3.2* 3.4* 4.1  CL 109 104 105 101 101 102  CO2 17* 20* 22 28 28 28   GLUCOSE 103* 107* 95 103* 89 91  BUN 19 26* 26* 22 23 25*  CREATININE 2.75* 3.44* 2.96* 2.74* 2.70* 2.45*  CALCIUM 8.5* 7.8* 7.8* 7.8* 7.9* 8.3*  MG 1.9   --   --   --   --   --    GFR: Estimated Creatinine Clearance: 32.1 mL/min (A) (by C-G formula based on SCr of 2.45 mg/dL (H)). Liver Function Tests: No results for input(s): AST, ALT, ALKPHOS, BILITOT, PROT, ALBUMIN in the last 168 hours. No results for input(s): LIPASE, AMYLASE in the last 168 hours. No results for input(s): AMMONIA in the last 168 hours. Coagulation Profile: No results for input(s): INR, PROTIME in the last 168 hours. Cardiac Enzymes: Recent Labs  Lab 05/11/18 0615  CKTOTAL 26*   BNP (last 3 results) No results for input(s): PROBNP in the last 8760 hours. HbA1C: No results for input(s): HGBA1C in the last 72 hours. CBG: Recent Labs  Lab 05/11/18 2027  GLUCAP 111*   Lipid Profile: No results for input(s): CHOL, HDL, LDLCALC, TRIG, CHOLHDL, LDLDIRECT in the last 72 hours. Thyroid Function Tests: No results for input(s): TSH, T4TOTAL, FREET4, T3FREE, THYROIDAB in the last 72 hours. Anemia Panel: No results for input(s): VITAMINB12, FOLATE, FERRITIN, TIBC, IRON, RETICCTPCT in the last 72 hours. Urine analysis:    Component Value Date/Time   COLORURINE YELLOW 05/06/2018 1609   APPEARANCEUR TURBID (A) 05/06/2018 1609   LABSPEC 1.012 05/06/2018 1609   PHURINE 5.0 05/06/2018 1609   GLUCOSEU NEGATIVE 05/06/2018  [place] 05/06/2018 [place] 05/06/2018 [place] 05/06/2018 1609   PROTEINUR NEGATIVE 05/06/2018 1609   UROBILINOGEN 0.2 05/08/2012 0314   NITRITE NEGATIVE 05/06/2018 1609   LEUKOCYTESUR NEGATIVE 05/06/2018 1609   Sepsis Labs: @LABRCNTIP (procalcitonin:4,lacticidven:4)  ) Recent Results (from the past 240 hour(s))  Culture, blood (routine x 2)     Status: None   Collection Time: 05/09/18  8:10 PM  Result Value Ref Range Status   Specimen Description BLOOD LEFT ARM  Final   Special Requests   Final    BOTTLES DRAWN AEROBIC ONLY Blood Culture results may not be optimal due to an inadequate  volume of blood received in culture bottles   Culture   Final    NO GROWTH 5 DAYS Performed at [facility], [place] [address], [place], [place] [address]    Report Status 05/14/2018 FINAL  Final  Culture, blood (routine x 2)     Status: None   Collection Time: 05/09/18  8:20 PM  Result Value Ref Range Status   Specimen Description BLOOD BLOOD LEFT FOREARM  Final   Special Requests   Final    BOTTLES DRAWN AEROBIC ONLY Blood Culture results may not be optimal due to an inadequate volume of blood received in culture bottles   Culture   Final    NO GROWTH 5 DAYS Performed at [facility], [place] [address] [place], [place] [address]    Report Status 05/14/2018 FINAL  Final      Studies: No results found.  Scheduled Meds: . amLODipine  10 mg Oral Daily  . apixaban  5 mg Oral BID  . bisacodyl  10 mg Rectal Daily  . carvedilol  12.5 mg Oral BID WC  . collagenase   Topical Daily  . liver oil-zinc oxide   Topical QID  . polyethylene glycol  17 g Oral BID  . senna-docusate  1 tablet Oral BID    Continuous Infusions: . DAPTOmycin (CUBICIN)  IV 730 mg (05/16/18 0915)     LOS: 14 days     Kayleen Memos, MD [facility] Pager [phone]  If 7PM-7AM, please contact night-coverage [email or web address] Password [facility] 05/16/2018, 12:53 PM

## 2018-05-17 LAB — COMPREHENSIVE METABOLIC PANEL
ALK PHOS: 78 U/L (ref 38–126)
ALT: 16 U/L (ref 0–44)
ANION GAP: 8 (ref 5–15)
AST: 20 U/L (ref 15–41)
Albumin: 2 g/dL — ABNORMAL LOW (ref 3.5–5.0)
BILIRUBIN TOTAL: 0.5 mg/dL (ref 0.3–1.2)
BUN: 23 mg/dL (ref 8–23)
CALCIUM: 8.5 mg/dL — AB (ref 8.9–10.3)
CO2: 25 mmol/L (ref 22–32)
Chloride: 104 mmol/L (ref 98–111)
Creatinine, Ser: 2.44 mg/dL — ABNORMAL HIGH (ref 0.61–1.24)
GFR, EST AFRICAN AMERICAN: 30 mL/min — AB (ref >60)
GFR, EST NON AFRICAN AMERICAN: 26 mL/min — AB (ref >60)
Glucose, Bld: 88 mg/dL (ref 70–99)
Potassium: 3.6 mmol/L (ref 3.5–5.1)
Sodium: 137 mmol/L (ref 135–145)
TOTAL PROTEIN: 6.2 g/dL — AB (ref 6.5–8.1)

## 2018-05-17 LAB — CBC WITH DIFFERENTIAL/PLATELET
Abs Immature Granulocytes: 0.05 10*3/uL (ref 0.00–0.07)
Basophils Absolute: 0.1 10*3/uL (ref 0.0–0.1)
Basophils Relative: 1 %
EOS PCT: 2 %
Eosinophils Absolute: 0.2 10*3/uL (ref 0.0–0.5)
HEMATOCRIT: 25.5 % — AB (ref 39.0–52.0)
HEMOGLOBIN: 7.8 g/dL — AB (ref 13.0–17.0)
Immature Granulocytes: 1 %
LYMPHS PCT: 15 %
Lymphs Abs: 1.5 10*3/uL (ref 0.7–4.0)
MCH: 26.5 pg (ref 26.0–34.0)
MCHC: 30.6 g/dL (ref 30.0–36.0)
MCV: 86.7 fL (ref 80.0–100.0)
MONO ABS: 0.6 10*3/uL (ref 0.1–1.0)
Monocytes Relative: 6 %
Neutro Abs: 7.5 10*3/uL (ref 1.7–7.7)
Neutrophils Relative %: 75 %
Platelets: 709 10*3/uL — ABNORMAL HIGH (ref 150–400)
RBC: 2.94 MIL/uL — ABNORMAL LOW (ref 4.22–5.81)
RDW: 14.6 % (ref 11.5–15.5)
WBC: 9.9 10*3/uL (ref 4.0–10.5)
nRBC: 0 % (ref 0.0–0.2)

## 2018-05-17 LAB — IRON AND TIBC
IRON: 16 ug/dL — AB (ref 45–182)
Saturation Ratios: 9 % — ABNORMAL LOW (ref 17.9–39.5)
TIBC: 188 ug/dL — ABNORMAL LOW (ref 250–450)
UIBC: 172 ug/dL

## 2018-05-17 LAB — FERRITIN: FERRITIN: 382 ng/mL — AB (ref 24–336)

## 2018-05-17 LAB — PHOSPHORUS: Phosphorus: 4.5 mg/dL (ref 2.5–4.6)

## 2018-05-17 LAB — MAGNESIUM: MAGNESIUM: 1.8 mg/dL (ref 1.7–2.4)

## 2018-05-17 MED ORDER — FERROUS SULFATE 325 (65 FE) MG PO TABS
325.0000 mg | ORAL_TABLET | Freq: Two times a day (BID) | ORAL | Status: DC
  Administered 2018-05-17 – 2018-05-31 (×29): 325 mg via ORAL
  Filled 2018-05-17 (×29): qty 1

## 2018-05-17 NOTE — Progress Notes (Signed)
PROGRESS NOTE  Wayne Buckley CVE:[id number] DOB: Jan 30, 1951 DOA: 05/02/2018 PCP: [name], MD  HPI/Recap of past 24 hours: For complete details please refer to admission H and P, but in brief  Wayne Buckley a 67 y.o.malewith medical history significant oftraumatic brain injury, central cord syndrome, neurogenic bladder with indwelling Foley catheter, bilateral leg weakness, prior DVT, hypertension, alcohol abuse. He presented secondary to fever. He was found to have MRSA in his urine prior to arrival. Blood cultures positive for MRSA and CT abdomen concerning for ileus.  05/17/2018: Patient seen and examined at his bedside.  No acute events overnight.  He has no new complaints.  He denies abdominal pain, nausea, chest pain, palpitation, dyspnea at rest.  Had a bowel movement yesterday.  Vital signs reviewed and are unremarkable.  Lab studies remarkable for iron deficiency anemia which will be supplemented.     Assessment/Plan: Principal Problem:   Ogilvie's syndrome Active Problems:   Alcohol abuse   AKI (acute kidney injury) ([place])   Central cord syndrome (HCC)   Essential hypertension   Sepsis (HCC)   Chronic anemia   Hyponatremia   Sacral ulcer (HCC)   History of DVT (deep vein thrombosis)   Traumatic brain injury ([place])   MRSA bacteremia   Bacteremia   Ileus (HCC)   Abdominal distension   Sepsis secondary to UTI and MRSA bacteremia -Patient met criteria for sepsis at the time of admission, source is secondary to UTI and MRSA bacteremia\ -Sepsis physiology is resolving  Iron deficiency anemia saturation ratio 9, iron 16, TIBC 188 Start ferrous sulfate 325 mg twice daily Hemoglobin 7.8 from 8.3 No sign of overt bleeding  CKD 4 Slight bump in creatinine from 2.28 yesterday to 2.44 this morning Start gentle IV fluid hydration Encourage patient to stay hydrated Monitor urine output Avoid nephrotoxic agents as possible  MRSA bacteremia, MRSA UTI -Repeat  blood cultures 11/15 showed no growth -2D echo showed no evidence of vegetations -ID consulted, initially was placed on IV vancomycin, now changed to daptomycin due to rising creatinine.  - TEE was unsuccessful due to ?obstruction, esophagogram showed no stricture -Tunneled midline placed, continue daptomycin renally dose till December 13/19  Resolving acute kidney injury with metabolic acidosis  -Vancomycin was discontinued, patient was briefly placed on IV fluids with bicarb. -Renal ultrasound did not show any obstruction or hydronephrosis. -Cr has been improving, bicarb drip was discontinued, creatinine peaked at 2.7  Ileus/abdominal distention -Patient has been followed by general surgery and gastroenterology, differentials including Ogilvie's syndrome, mechanical component - Esophagogram results discussed with GI, Dr. [name]. Per GI, patient will not be able to tolerate EGD with multiple comorbidities currently and no obvious stricture on the esophagram. -Regarding ileus, GI recommended enemas, advance diet, increase mobility, sit upright. Patient is not a candidate for neostigmine or colonoscopy decompression. Per GI, this is commonly seen in patients with TBI and cord compression and will spontaneously resolve. -Continue soft solids.  Patient will need good bowel regimen with enema scheduled or every other day for constipation. Had BM today 05/16/18.  Urinary retention with neurogenic bladder -Per admission history patient with indwelling Foley catheter secondary to neurogenic bladder, TBI and cord compression - follow outpatient with urology and outpatient voiding trial, patient was supposed to have appointment on 11/20. Foley catheter changed.  Resolved Leukocytosis  History of DVT, left lower extremity, diagnosed in 02/2018 Continue Eliquis  History of TBI -Continue care at skilled nursing facility  Essential hypertension BP stable,  continue amlodipine and  Coreg  History of alcohol use Currently stable, no acute issues  Pressure injury POA, bilateral buttocks, deep tissue injury Wound care per nursing  Thrombocytosis -Likely reactive from sepsis, bacteremia, platelet count improving, 660 -Obtain CBC today and tomorrow  Resolved hypokalemia post repletion   Consults:   Infectious disease Gastroenterology General surgery Cardiology Inpatient rehab   Allergies:  No Known Allergies  Full code    Objective: Vitals:   05/16/18 2240 05/16/18 2347 05/17/18 0515 05/17/18 0831  BP: (!) 146/122 (!) 151/64 131/79 129/73  Pulse: 95 94 95 97  Resp: 18 20 20 18   Temp: (!) 100.4 F (38 C) 98.6 F (37 C) 97.8 F (36.6 C) 97.9 F (36.6 C)  TempSrc: Oral Oral Oral Oral  SpO2: 97% (!) 89% 98% 98%  Weight:      Height:        Intake/Output Summary (Last 24 hours) at 05/17/2018 1601 Last data filed at 05/17/2018 1500 Gross per 24 hour  Intake 614 ml  Output 1600 ml  Net -986 ml   Filed Weights   05/04/18 2314 05/08/18 2012 05/09/18 2140  Weight: 90.5 kg 90.4 kg 90.8 kg    Exam:  . General: 67 y.o. year-old male well developed well-nourished in no acute distress.  Alert oriented x3. . Cardiovascular: Regular rate and rhythm with no rubs or gallops.  No JVD or thyromegaly noted.   Marland Kitchen Respiratory: Clear to auscultation with no wheezes or rales.  Good respiratory effort.  . Abdomen: Soft nontender nondistended with normal bowel sounds x4 quadrants. . Musculoskeletal: Trace lower extremity edema. 2/4 pulses in all 4 extremities. . Skin:Pressure injury, poa on bilateral buttocks . Psychiatry: Mood is appropriate for condition and setting   Data Reviewed: CBC: Recent Labs  Lab 05/12/18 0628 05/13/18 0503 05/14/18 0719 05/16/18 1339 05/17/18 0659  WBC 12.9* 11.7* 9.7 10.0 9.9  NEUTROABS  --   --   --   --  7.5  HGB 7.6* 8.0* 7.6* 8.3* 7.8*  HCT 24.6* 25.1* 24.5* 26.1* 25.5*  MCV 87.5 86.3 86.9 85.9 86.7  PLT  779* 768* 660* 643* 355*   Basic Metabolic Panel: Recent Labs  Lab 05/13/18 0629 05/14/18 0719 05/15/18 0533 05/16/18 1339 05/17/18 0659  NA 136 137 138 137 137  K 3.2* 3.4* 4.1 4.1 3.6  CL 101 101 102 101 104  CO2 28 28 28 26 25   GLUCOSE 103* 89 91 90 88  BUN 22 23 25* 24* 23  CREATININE 2.74* 2.70* 2.45* 2.28* 2.44*  CALCIUM 7.8* 7.9* 8.3* 8.5* 8.5*  MG  --   --   --   --  1.8  PHOS  --   --   --   --  4.5   GFR: Estimated Creatinine Clearance: 32.3 mL/min (A) (by C-G formula based on SCr of 2.44 mg/dL (H)). Liver Function Tests: Recent Labs  Lab 05/17/18 0659  AST 20  ALT 16  ALKPHOS 78  BILITOT 0.5  PROT 6.2*  ALBUMIN 2.0*   No results for input(s): LIPASE, AMYLASE in the last 168 hours. No results for input(s): AMMONIA in the last 168 hours. Coagulation Profile: No results for input(s): INR, PROTIME in the last 168 hours. Cardiac Enzymes: Recent Labs  Lab 05/11/18 0615  CKTOTAL 26*   BNP (last 3 results) No results for input(s): PROBNP in the last 8760 hours. HbA1C: No results for input(s): HGBA1C in the last 72 hours. CBG: Recent Labs  Lab 05/11/18  2027  GLUCAP 111*   Lipid Profile: No results for input(s): CHOL, HDL, LDLCALC, TRIG, CHOLHDL, LDLDIRECT in the last 72 hours. Thyroid Function Tests: No results for input(s): TSH, T4TOTAL, FREET4, T3FREE, THYROIDAB in the last 72 hours. Anemia Panel: Recent Labs    05/17/18 0659  FERRITIN 382*  TIBC 188*  IRON 16*   Urine analysis:    Component Value Date/Time   COLORURINE YELLOW 05/06/2018 1609   APPEARANCEUR TURBID (A) 05/06/2018 1609   LABSPEC 1.012 05/06/2018 1609   PHURINE 5.0 05/06/2018 1609   GLUCOSEU NEGATIVE 05/06/2018 1609   HGBUR NEGATIVE 05/06/2018 1609   BILIRUBINUR NEGATIVE 05/06/2018 1609   KETONESUR NEGATIVE 05/06/2018 1609   PROTEINUR NEGATIVE 05/06/2018 1609   UROBILINOGEN 0.2 05/08/2012 0314   NITRITE NEGATIVE 05/06/2018 1609   LEUKOCYTESUR NEGATIVE 05/06/2018 1609    Sepsis Labs: @LABRCNTIP (procalcitonin:4,lacticidven:4)  ) Recent Results (from the past 240 hour(s))  Culture, blood (routine x 2)     Status: None   Collection Time: 05/09/18  8:10 PM  Result Value Ref Range Status   Specimen Description BLOOD LEFT ARM  Final   Special Requests   Final    BOTTLES DRAWN AEROBIC ONLY Blood Culture results may not be optimal due to an inadequate volume of blood received in culture bottles   Culture   Final    NO GROWTH 5 DAYS Performed at [facility], [place] [address] [place], [place] [address]    Report Status 05/14/2018 FINAL  Final  Culture, blood (routine x 2)     Status: None   Collection Time: 05/09/18  8:20 PM  Result Value Ref Range Status   Specimen Description BLOOD BLOOD LEFT FOREARM  Final   Special Requests   Final    BOTTLES DRAWN AEROBIC ONLY Blood Culture results may not be optimal due to an inadequate volume of blood received in culture bottles   Culture   Final    NO GROWTH 5 DAYS Performed at [facility], [place] [address] [place], [place] [address]    Report Status 05/14/2018 FINAL  Final      Studies: No results found.  Scheduled Meds: . amLODipine  10 mg Oral Daily  . apixaban  5 mg Oral BID  . bisacodyl  10 mg Rectal Daily  . carvedilol  12.5 mg Oral BID WC  . collagenase   Topical Daily  . liver oil-zinc oxide   Topical QID  . polyethylene glycol  17 g Oral BID  . senna-docusate  1 tablet Oral BID    Continuous Infusions: . DAPTOmycin (CUBICIN)  IV 730 mg (05/17/18 0848)     LOS: 15 days     Kayleen Memos, MD [facility] Pager [phone]  If 7PM-7AM, please contact night-coverage [email or web address] Password [email or web address] 05/17/2018, 4:01 PM

## 2018-05-18 LAB — CK: CK TOTAL: 30 U/L — AB (ref 49–397)

## 2018-05-18 LAB — BASIC METABOLIC PANEL
Anion gap: 11 (ref 5–15)
BUN: 21 mg/dL (ref 8–23)
CALCIUM: 8.5 mg/dL — AB (ref 8.9–10.3)
CHLORIDE: 102 mmol/L (ref 98–111)
CO2: 25 mmol/L (ref 22–32)
CREATININE: 2.16 mg/dL — AB (ref 0.61–1.24)
GFR, EST AFRICAN AMERICAN: 35 mL/min — AB (ref >60)
GFR, EST NON AFRICAN AMERICAN: 30 mL/min — AB (ref >60)
Glucose, Bld: 104 mg/dL — ABNORMAL HIGH (ref 70–99)
Potassium: 3.7 mmol/L (ref 3.5–5.1)
SODIUM: 138 mmol/L (ref 135–145)

## 2018-05-18 NOTE — Social Work (Signed)
CSW continuing to follow for support with disposition when medically appropriate. Pt still on daptomycin, which is a challenge for pt transfer to SNF due to cost of medication.  Octavio GravesIsabel Assunta Pupo, MSW, [facility] LPCSWA [place] Clinical Social Work [phone]

## 2018-05-18 NOTE — Progress Notes (Signed)
PROGRESS NOTE  [name] UVJ:[id number] DOB: [birth year] DOA: 05/02/2018 PCP: [name], MD  HPI/Recap of past 24 hours: For complete details please refer to admission H and P, but in brief  Wayne Buckley a 67 y.o.malewith medical history significant oftraumatic brain injury, central cord syndrome, neurogenic bladder with indwelling Foley catheter, bilateral leg weakness, prior DVT, hypertension, alcohol abuse. He presented secondary to fever. He was found to have MRSA in his urine prior to arrival. Blood cultures positive for MRSA and CT abdomen concerning for ileus.  05/17/2018: Patient seen and examined at his bedside.  No acute events overnight.  He has no new complaints.  He denies abdominal pain, nausea, chest pain, palpitation, dyspnea at rest.  Had a bowel movement yesterday.  Vital signs reviewed and are unremarkable.  Lab studies remarkable for iron deficiency anemia which will be supplemented.  05/08/2018: Patient seen and examined at his bedside.  No acute events overnight.  Denies nausea abdominal pain chest pain, palpitations or dyspnea.  He has no new complaints.  Awaiting placement to SNF.     Assessment/Plan: Principal Problem:   Ogilvie's syndrome Active Problems:   Alcohol abuse   AKI (acute kidney injury) ([place])   Central cord syndrome (HCC)   Essential hypertension   Sepsis (HCC)   Chronic anemia   Hyponatremia   Sacral ulcer (HCC)   History of DVT (deep vein thrombosis)   Traumatic brain injury ([place])   MRSA bacteremia   Bacteremia   Ileus (HCC)   Abdominal distension   Sepsis secondary to UTI and MRSA bacteremia -Patient met criteria for sepsis at the time of admission, source is secondary to UTI and MRSA bacteremia\ -Sepsis physiology is resolving  Iron deficiency anemia saturation ratio 9, iron 16, TIBC 188 Continue ferrous sulfate 325 mg twice daily Hemoglobin 7.8 from 8.3 No sign of overt bleeding  CKD 4 Creatinine is trending  down from 2.44 to 2.16  Continue gentle IV fluid hydration Encourage patient to stay hydrated Good urine output 1100 cc recorded Avoid nephrotoxic agents as possible  MRSA bacteremia, MRSA UTI -Repeat blood cultures 11/15 showed no growth -2D echo showed no evidence of vegetations -ID consulted, initially was placed on IV vancomycin, now changed to daptomycin due to rising creatinine.  - TEE was unsuccessful due to ?obstruction, esophagogram showed no stricture -Tunneled midline placed, continue daptomycin renally dose till December 13/19  Resolving acute kidney injury with metabolic acidosis  -Vancomycin was discontinued, patient was briefly placed on IV fluids with bicarb. -Renal ultrasound did not show any obstruction or hydronephrosis. -Cr has been improving, bicarb drip was discontinued, creatinine peaked at 2.7  Ileus/abdominal distention -Patient has been followed by general surgery and gastroenterology, differentials including Ogilvie's syndrome, mechanical component - Esophagogram results discussed with GI, Dr. [name]. Per GI, patient will not be able to tolerate EGD with multiple comorbidities currently and no obvious stricture on the esophagram. -Regarding ileus, GI recommended enemas, advance diet, increase mobility, sit upright. Patient is not a candidate for neostigmine or colonoscopy decompression. Per GI, this is commonly seen in patients with TBI and cord compression and will spontaneously resolve. -Continue soft solids.  Patient will need good bowel regimen with enema scheduled or every other day for constipation. Had BM today 05/16/18.  Urinary retention with neurogenic bladder -Per admission history patient with indwelling Foley catheter secondary to neurogenic bladder, TBI and cord compression - follow outpatient with urology and outpatient voiding trial, patient was supposed to have  appointment on 11/20. Foley catheter changed.  Resolved  Leukocytosis  History of DVT, left lower extremity, diagnosed in 02/2018 Continue Eliquis  History of TBI -Continue care at skilled nursing facility  Essential hypertension BP stable, continue amlodipine and Coreg  History of alcohol use Currently stable, no acute issues  Pressure injury POA, bilateral buttocks, deep tissue injury Wound care per nursing  Thrombocytosis -Likely reactive from sepsis, bacteremia, platelet count improving, 660 -Obtain CBC today and tomorrow  Resolved hypokalemia post repletion   Consults:   Infectious disease Gastroenterology General surgery Cardiology Inpatient rehab   Allergies:  No Known Allergies  Full code    Objective: Vitals:   05/17/18 0831 05/17/18 1638 05/17/18 2001 05/18/18 0414  BP: 129/73 115/70 (!) 129/96 (!) 141/108  Pulse: 97 92 66 92  Resp: _0 Temp: 97.9 F (36.6 C) 97.8 F (36.6 C) 99.2 F (37.3 C) 100 F (37.8 C)  TempSrc: Oral Oral Oral Oral  SpO2: 98% 97% (!) 89% 93%  Weight:   90.9 kg   Height:        Intake/Output Summary (Last 24 hours) at 05/18/2018 1407 Last data filed at 05/18/2018 0930 Gross per 24 hour  Intake 240 ml  Output 850 ml  Net -610 ml   Filed Weights   05/08/18 2012 05/09/18 2140 05/17/18 2001  Weight: 90.4 kg 90.8 kg 90.9 kg    Exam:  . General: 67 y.o. year-old male developed well-nourished in no acute distress.  Alert oriented x3.   . Cardiovascular: Regular rate and rhythm with no rubs or gallops.  No JVD or thyromegaly noted. Marland Kitchen Respiratory: Clear to auscultation with no wheezes or rales.  Good inspiratory effort.   . Abdomen: Soft nontender nondistended with normal bowel sounds x4 quadrants. . Musculoskeletal: Trace lower extremity edema. 2/4 pulses in all 4 extremities. . Skin:Pressure injury, poa on bilateral buttocks . Psychiatry: Mood is appropriate for condition and setting   Data Reviewed: CBC: Recent Labs  Lab 05/12/18 0628  05/13/18 0503 05/14/18 0719 05/16/18 1339 05/17/18 0659  WBC 12.9* 11.7* 9.7 10.0 9.9  NEUTROABS  --   --   --   --  7.5  HGB 7.6* 8.0* 7.6* 8.3* 7.8*  HCT 24.6* 25.1* 24.5* 26.1* 25.5*  MCV 87.5 86.3 86.9 85.9 86.7  PLT 779* 768* 660* 643* 161*   Basic Metabolic Panel: Recent Labs  Lab 05/14/18 0719 05/15/18 0533 05/16/18 1339 05/17/18 0659 05/18/18 0539  NA 137 138 137 137 138  K 3.4* 4.1 4.1 3.6 3.7  CL 101 102 101 104 102  CO2 _1 GLUCOSE 89 91 90 88 104*  BUN 23 25* 24* 23 21  CREATININE 2.70* 2.45* 2.28* 2.44* 2.16*  CALCIUM 7.9* 8.3* 8.5* 8.5* 8.5*  MG  --   --   --  1.8  --   PHOS  --   --   --  4.5  --    GFR: Estimated Creatinine Clearance: 36.5 mL/min (A) (by C-G formula based on SCr of 2.16 mg/dL (H)). Liver Function Tests: Recent Labs  Lab 05/17/18 0659  AST 20  ALT 16  ALKPHOS 78  BILITOT 0.5  PROT 6.2*  ALBUMIN 2.0*   No results for input(s): LIPASE, AMYLASE in the last 168 hours. No results for input(s): AMMONIA in the last 168 hours. Coagulation Profile: No results for input(s): INR, PROTIME in the last 168 hours. Cardiac Enzymes: Recent Labs  Lab 05/18/18 0539  CKTOTAL 30*   BNP (last 3 results) No results for input(s): PROBNP in the last 8760 hours. HbA1C: No results for input(s): HGBA1C in the last 72 hours. CBG: Recent Labs  Lab 05/11/18 2027  GLUCAP 111*   Lipid Profile: No results for input(s): CHOL, HDL, LDLCALC, TRIG, CHOLHDL, LDLDIRECT in the last 72 hours. Thyroid Function Tests: No results for input(s): TSH, T4TOTAL, FREET4, T3FREE, THYROIDAB in the last 72 hours. Anemia Panel: Recent Labs    05/17/18 0659  FERRITIN 382*  TIBC 188*  IRON 16*   Urine analysis:    Component Value Date/Time   COLORURINE YELLOW 05/06/2018 1609   APPEARANCEUR TURBID (A) 05/06/2018 1609   LABSPEC 1.012 05/06/2018 1609   PHURINE 5.0 05/06/2018 1609   GLUCOSEU NEGATIVE 05/06/2018 1609   HGBUR NEGATIVE 05/06/2018 1609    BILIRUBINUR NEGATIVE 05/06/2018 1609   KETONESUR NEGATIVE 05/06/2018 1609   PROTEINUR NEGATIVE 05/06/2018 1609   UROBILINOGEN 0.2 05/08/2012 0314   NITRITE NEGATIVE 05/06/2018 1609   LEUKOCYTESUR NEGATIVE 05/06/2018 1609   Sepsis Labs: _0 (procalcitonin:4,lacticidven:4)  ) Recent Results (from the past 240 hour(s))  Culture, blood (routine x 2)     Status: None   Collection Time: 05/09/18  8:10 PM  Result Value Ref Range Status   Specimen Description BLOOD LEFT ARM  Final   Special Requests   Final    BOTTLES DRAWN AEROBIC ONLY Blood Culture results may not be optimal due to an inadequate volume of blood received in culture bottles   Culture   Final    NO GROWTH 5 DAYS Performed at [facility], [place] [address], [place], [place] [address]    Report Status 05/14/2018 FINAL  Final  Culture, blood (routine x 2)     Status: None   Collection Time: 05/09/18  8:20 PM  Result Value Ref Range Status   Specimen Description BLOOD BLOOD LEFT FOREARM  Final   Special Requests   Final    BOTTLES DRAWN AEROBIC ONLY Blood Culture results may not be optimal due to an inadequate volume of blood received in culture bottles   Culture   Final    NO GROWTH 5 DAYS Performed at [facility], [place] [address], [place], [place] [address]    Report Status 05/14/2018 FINAL  Final      Studies: No results found.  Scheduled Meds: . amLODipine  10 mg Oral Daily  . apixaban  5 mg Oral BID  . bisacodyl  10 mg Rectal Daily  . carvedilol  12.5 mg Oral BID WC  . collagenase   Topical Daily  . ferrous sulfate  325 mg Oral BID WC  . liver oil-zinc oxide   Topical QID  . polyethylene glycol  17 g Oral BID  . senna-docusate  1 tablet Oral BID    Continuous Infusions: . DAPTOmycin (CUBICIN)  IV 730 mg (05/18/18 0945)     LOS: 16 days     Kayleen Memos, MD [facility] Pager [phone]  If 7PM-7AM, please contact night-coverage [email or web address] Password  [email or web address] 05/18/2018, 2:07 PM

## 2018-05-18 NOTE — Progress Notes (Signed)
Pharmacy Antibiotic Note  Wayne Buckley is a 67 y.o. male admitted on 05/02/2018 with MRSA bacteremia.   Pharmacy has been consulted for Daptomycin dosing. Blood cultures from 11/15 are NGTD. TEE was unable to be performed due to an esophageal stricture.   Scr 2.16, AF, CK 30, CrCl ~36  Plan: Daptomycin 730 mg (~ 8 mg/kg) every 24 hours Weekly CK on Saturdays  Height: 5\' 9"  (175.3 cm) Weight: 200 lb 6.4 oz (90.9 kg) IBW/kg (Calculated) : 70.7  Temp (24hrs), Avg:99 F (37.2 C), Min:97.8 F (36.6 C), Max:100 F (37.8 C)  Recent Labs  Lab 05/12/18 0628 05/13/18 0503  05/14/18 0719 05/15/18 0533 05/16/18 1339 05/17/18 0659 05/18/18 0539  WBC 12.9* 11.7*  --  9.7  --  10.0 9.9  --   CREATININE 2.96*  --    < > 2.70* 2.45* 2.28* 2.44* 2.16*   < > = values in this interval not displayed.    Estimated Creatinine Clearance: 36.5 mL/min (A) (by C-G formula based on SCr of 2.16 mg/dL (H)).    No Known Allergies  Antimicrobials this admission: Cefepime 11/14>>11/15 Vanc 11/15>>11/22 Dapto 11/22>>12/13  Ulyses SouthwardMinh Pham, PharmD, BCIDP, AAHIVP, CPP Infectious Disease Pharmacist 05/18/2018 1:22 PM

## 2018-05-19 ENCOUNTER — Inpatient Hospital Stay (HOSPITAL_COMMUNITY): Payer: Medicare Other

## 2018-05-19 MED ORDER — SENNOSIDES-DOCUSATE SODIUM 8.6-50 MG PO TABS
2.0000 | ORAL_TABLET | Freq: Two times a day (BID) | ORAL | Status: DC
  Administered 2018-05-19 – 2018-05-22 (×7): 2 via ORAL
  Filled 2018-05-19 (×7): qty 2

## 2018-05-19 MED ORDER — POLYETHYLENE GLYCOL 3350 17 G PO PACK
17.0000 g | PACK | Freq: Every day | ORAL | Status: DC
  Administered 2018-05-20: 17 g via ORAL
  Filled 2018-05-19: qty 1

## 2018-05-19 NOTE — Progress Notes (Addendum)
PROGRESS NOTE  Wayne [place] KDX:[id number] DOB: 16-Feb-1951 DOA: 05/02/2018 PCP: [name], MD  HPI/Recap of past 24 hours: For complete details please refer to admission H and P, but in brief  Wayne Buckley a 67 y.o.malewith medical history significant oftraumatic brain injury, central cord syndrome, neurogenic bladder with indwelling Foley catheter, bilateral leg weakness, prior DVT, hypertension, alcohol abuse. He presented secondary to fever. He was found to have MRSA in his urine prior to arrival. Blood cultures positive for MRSA and CT abdomen concerning for ileus.  05/17/2018: Had a bowel movement yesterday.  Vital signs reviewed and are unremarkable.  Lab studies remarkable for iron deficiency anemia which will be supplemented.  05/18/2018:  Awaiting placement to SNF.  05/19/2018: Patient seen and examined at his bedside.  No acute events overnight.  Patient has no complaints.  His abdomen appears distended however he denies abdominal pain or nausea.  Has not had a bowel movement in 3 days.  Increase frequency of his medications for bowel regimen.  Will obtain a abdominal x-ray to reassess.  Last abdominal x-ray done on 05/09/18 had revealed dilated loops of colon.    Assessment/Plan: Principal Problem:   Ogilvie's syndrome Active Problems:   Alcohol abuse   AKI (acute kidney injury) ([place])   Central cord syndrome (HCC)   Essential hypertension   Sepsis (HCC)   Chronic anemia   Hyponatremia   Sacral ulcer (HCC)   History of DVT (deep vein thrombosis)   Traumatic brain injury ([place])   MRSA bacteremia   Bacteremia   Ileus (HCC)   Abdominal distension   Sepsis secondary to UTI and MRSA bacteremia -Patient met criteria for sepsis at the time of admission, source is secondary to UTI and MRSA bacteremia_0 -Sepsis physiology is resolving  Iron deficiency anemia saturation ratio 9, iron 16, TIBC 188 Continue ferrous sulfate 325 mg twice daily Hemoglobin 7.8 from  8.3 No sign of overt bleeding  Chronic constipation No bowel movements in 3 days Added Senokot 2 tablets twice daily, MiraLAX twice daily, Dulcolax suppository Abdominal x-ray done today 05/19/2018 unchanged from prior done 05/09/2018  CKD 4 Creatinine is trending down from 2.44 to 2.16  Continue gentle IV fluid hydration Encourage patient to stay hydrated Good urine output 1100 cc recorded Avoid nephrotoxic agents as possible  MRSA bacteremia, MRSA UTI -Repeat blood cultures 11/15 showed no growth -2D echo showed no evidence of vegetations -ID consulted, initially was placed on IV vancomycin, now changed to daptomycin due to rising creatinine.  - TEE was unsuccessful due to ?obstruction, esophagogram showed no stricture -Tunneled midline placed, continue daptomycin renally dose till December 13/19  Resolving acute kidney injury with metabolic acidosis  -Vancomycin was discontinued, patient was briefly placed on IV fluids with bicarb. -Renal ultrasound did not show any obstruction or hydronephrosis. -Cr has been improving, bicarb drip was discontinued, creatinine peaked at 2.7  Ileus/abdominal distention -Patient has been followed by general surgery and gastroenterology, differentials including Ogilvie's syndrome, mechanical component - Esophagogram results discussed with GI, Dr. [name]. Per GI, patient will not be able to tolerate EGD with multiple comorbidities currently and no obvious stricture on the esophagram. -Regarding ileus, GI recommended enemas, advance diet, increase mobility, sit upright. Patient is not a candidate for neostigmine or colonoscopy decompression. Per GI, this is commonly seen in patients with TBI and cord compression and will spontaneously resolve. -Continue soft solids.  Patient will need good bowel regimen with enema scheduled or every other day for  constipation. Had BM today 05/16/18.  Urinary retention with neurogenic bladder -Per admission  history patient with indwelling Foley catheter secondary to neurogenic bladder, TBI and cord compression - follow outpatient with urology and outpatient voiding trial, patient was supposed to have appointment on 11/20. Foley catheter changed.  Resolved Leukocytosis  History of DVT, left lower extremity, diagnosed in 02/2018 Continue Eliquis  History of TBI -Continue care at skilled nursing facility  Essential hypertension BP stable, continue amlodipine and Coreg  History of alcohol use Currently stable, no acute issues  Pressure injury POA, bilateral buttocks, deep tissue injury Wound care per nursing  Thrombocytosis -Likely reactive from sepsis, bacteremia, platelet count improving, 660 -Obtain CBC today and tomorrow  Resolved hypokalemia post repletion   Consults:   Infectious disease Gastroenterology General surgery Cardiology Inpatient rehab   Allergies:  No Known Allergies  Full code    Objective: Vitals:   05/18/18 0414 05/18/18 0755 05/18/18 1812 05/19/18 0526  BP: (!) 141/108 128/86 130/80 133/75  Pulse: 92 86 89 90  Resp: 20 (!) _0 Temp: 100 F (37.8 C) 99.1 F (37.3 C) 98.7 F (37.1 C) 98.8 F (37.1 C)  TempSrc: Oral Oral Oral   SpO2: 93% 96% 97% 96%  Weight:      Height:        Intake/Output Summary (Last 24 hours) at 05/19/2018 1102 Last data filed at 05/19/2018 0945 Gross per 24 hour  Intake 540 ml  Output 1300 ml  Net -760 ml   Filed Weights   05/08/18 2012 05/09/18 2140 05/17/18 2001  Weight: 90.4 kg 90.8 kg 90.9 kg    Exam:  . General: 67 y.o. year-old male developed well-nourished in no acute distress.  Alert oriented times 3.   . Cardiovascular: Regular rate and rhythm with no rubs or gallops.  No JVD or thyromegaly noted.   Marland Kitchen Respiratory: Clear to auscultation with no wheezes or rales.  Good inspiratory effort.   . Abdomen: Moderately distended.  Nontender on palpation.  Hypoactive bowel  sounds. . Musculoskeletal: Trace lower extremity edema. 2/4 pulses in all 4 extremities. . Skin:Pressure injury, poa on bilateral buttocks . Psychiatry: Mood is appropriate for condition and setting   Data Reviewed: CBC: Recent Labs  Lab 05/13/18 0503 05/14/18 0719 05/16/18 1339 05/17/18 0659  WBC 11.7* 9.7 10.0 9.9  NEUTROABS  --   --   --  7.5  HGB 8.0* 7.6* 8.3* 7.8*  HCT 25.1* 24.5* 26.1* 25.5*  MCV 86.3 86.9 85.9 86.7  PLT 768* 660* 643* 295*   Basic Metabolic Panel: Recent Labs  Lab 05/14/18 0719 05/15/18 0533 05/16/18 1339 05/17/18 0659 05/18/18 0539  NA 137 138 137 137 138  K 3.4* 4.1 4.1 3.6 3.7  CL 101 102 101 104 102  CO2 _1 GLUCOSE 89 91 90 88 104*  BUN 23 25* 24* 23 21  CREATININE 2.70* 2.45* 2.28* 2.44* 2.16*  CALCIUM 7.9* 8.3* 8.5* 8.5* 8.5*  MG  --   --   --  1.8  --   PHOS  --   --   --  4.5  --    GFR: Estimated Creatinine Clearance: 36.5 mL/min (A) (by C-G formula based on SCr of 2.16 mg/dL (H)). Liver Function Tests: Recent Labs  Lab 05/17/18 0659  AST 20  ALT 16  ALKPHOS 78  BILITOT 0.5  PROT 6.2*  ALBUMIN 2.0*   No results for input(s): LIPASE, AMYLASE in the last  168 hours. No results for input(s): AMMONIA in the last 168 hours. Coagulation Profile: No results for input(s): INR, PROTIME in the last 168 hours. Cardiac Enzymes: Recent Labs  Lab 05/18/18 0539  CKTOTAL 30*   BNP (last 3 results) No results for input(s): PROBNP in the last 8760 hours. HbA1C: No results for input(s): HGBA1C in the last 72 hours. CBG: No results for input(s): GLUCAP in the last 168 hours. Lipid Profile: No results for input(s): CHOL, HDL, LDLCALC, TRIG, CHOLHDL, LDLDIRECT in the last 72 hours. Thyroid Function Tests: No results for input(s): TSH, T4TOTAL, FREET4, T3FREE, THYROIDAB in the last 72 hours. Anemia Panel: Recent Labs    05/17/18 0659  FERRITIN 382*  TIBC 188*  IRON 16*   Urine analysis:    Component Value  Date/Time   COLORURINE YELLOW 05/06/2018 1609   APPEARANCEUR TURBID (A) 05/06/2018 1609   LABSPEC 1.012 05/06/2018 1609   PHURINE 5.0 05/06/2018 1609   GLUCOSEU NEGATIVE 05/06/2018 1609   HGBUR NEGATIVE 05/06/2018 1609   BILIRUBINUR NEGATIVE 05/06/2018 1609   KETONESUR NEGATIVE 05/06/2018 1609   PROTEINUR NEGATIVE 05/06/2018 1609   UROBILINOGEN 0.2 05/08/2012 0314   NITRITE NEGATIVE 05/06/2018 1609   LEUKOCYTESUR NEGATIVE 05/06/2018 1609   Sepsis Labs: _0 (procalcitonin:4,lacticidven:4)  ) Recent Results (from the past 240 hour(s))  Culture, blood (routine x 2)     Status: None   Collection Time: 05/09/18  8:10 PM  Result Value Ref Range Status   Specimen Description BLOOD LEFT ARM  Final   Special Requests   Final    BOTTLES DRAWN AEROBIC ONLY Blood Culture results may not be optimal due to an inadequate volume of blood received in culture bottles   Culture   Final    NO GROWTH 5 DAYS Performed at [facility], [place] [address], [place], [place] [address]    Report Status 05/14/2018 FINAL  Final  Culture, blood (routine x 2)     Status: None   Collection Time: 05/09/18  8:20 PM  Result Value Ref Range Status   Specimen Description BLOOD BLOOD LEFT FOREARM  Final   Special Requests   Final    BOTTLES DRAWN AEROBIC ONLY Blood Culture results may not be optimal due to an inadequate volume of blood received in culture bottles   Culture   Final    NO GROWTH 5 DAYS Performed at [facility], [place] [address] [place], [place] [address]    Report Status 05/14/2018 FINAL  Final      Studies: No results found.  Scheduled Meds: . amLODipine  10 mg Oral Daily  . apixaban  5 mg Oral BID  . bisacodyl  10 mg Rectal Daily  . carvedilol  12.5 mg Oral BID WC  . collagenase   Topical Daily  . ferrous sulfate  325 mg Oral BID WC  . liver oil-zinc oxide   Topical QID  . polyethylene glycol  17 g Oral TID  . senna-docusate  2 tablet Oral BID    Continuous  Infusions: . DAPTOmycin (CUBICIN)  IV 730 mg (05/19/18 0851)     LOS: 17 days     Kayleen Memos, MD [facility] Pager [phone]  If 7PM-7AM, please contact night-coverage [email or web address] Password [email or web address] 05/19/2018, 11:02 AM

## 2018-05-20 LAB — CBC WITH DIFFERENTIAL/PLATELET
Abs Immature Granulocytes: 0.04 10*3/uL (ref 0.00–0.07)
BASOS ABS: 0 10*3/uL (ref 0.0–0.1)
Basophils Relative: 1 %
EOS PCT: 2 %
Eosinophils Absolute: 0.1 10*3/uL (ref 0.0–0.5)
HEMATOCRIT: 24.1 % — AB (ref 39.0–52.0)
HEMOGLOBIN: 7.5 g/dL — AB (ref 13.0–17.0)
Immature Granulocytes: 1 %
LYMPHS ABS: 1.2 10*3/uL (ref 0.7–4.0)
Lymphocytes Relative: 17 %
MCH: 26.9 pg (ref 26.0–34.0)
MCHC: 31.1 g/dL (ref 30.0–36.0)
MCV: 86.4 fL (ref 80.0–100.0)
Monocytes Absolute: 0.5 10*3/uL (ref 0.1–1.0)
Monocytes Relative: 7 %
NEUTROS PCT: 72 %
NRBC: 0 % (ref 0.0–0.2)
Neutro Abs: 5.5 10*3/uL (ref 1.7–7.7)
Platelets: 539 10*3/uL — ABNORMAL HIGH (ref 150–400)
RBC: 2.79 MIL/uL — ABNORMAL LOW (ref 4.22–5.81)
RDW: 14.5 % (ref 11.5–15.5)
WBC: 7.4 10*3/uL (ref 4.0–10.5)

## 2018-05-20 LAB — BASIC METABOLIC PANEL
ANION GAP: 9 (ref 5–15)
BUN: 20 mg/dL (ref 8–23)
CALCIUM: 8.6 mg/dL — AB (ref 8.9–10.3)
CO2: 25 mmol/L (ref 22–32)
CREATININE: 1.94 mg/dL — AB (ref 0.61–1.24)
Chloride: 103 mmol/L (ref 98–111)
GFR, EST AFRICAN AMERICAN: 40 mL/min — AB (ref >60)
GFR, EST NON AFRICAN AMERICAN: 35 mL/min — AB (ref >60)
Glucose, Bld: 97 mg/dL (ref 70–99)
Potassium: 3.5 mmol/L (ref 3.5–5.1)
SODIUM: 137 mmol/L (ref 135–145)

## 2018-05-20 MED ORDER — POLYETHYLENE GLYCOL 3350 17 G PO PACK
17.0000 g | PACK | Freq: Two times a day (BID) | ORAL | Status: DC
  Administered 2018-05-20 – 2018-05-22 (×5): 17 g via ORAL
  Filled 2018-05-20 (×4): qty 1

## 2018-05-20 MED ORDER — MAGNESIUM HYDROXIDE 400 MG/5ML PO SUSP
15.0000 mL | Freq: Once | ORAL | Status: DC
  Filled 2018-05-20: qty 30

## 2018-05-20 MED ORDER — POLYETHYLENE GLYCOL 3350 17 G PO PACK: 17.0000 g | PACK | Freq: Two times a day (BID) | ORAL | Status: DC

## 2018-05-20 NOTE — Progress Notes (Signed)
CSW continuing to follow for support with disposition when medically appropriate. Pt still on daptomycin, which is a challenge for pt transfer to SNF due to cost of medication.  Osborne Cascoadia Shamyra Farias LCSW [phone]

## 2018-05-20 NOTE — Progress Notes (Signed)
PROGRESS NOTE  Wayne Buckley OYD:[id number] DOB: 24-Jan-1951 DOA: 05/02/2018 PCP: [name], MD  HPI/Recap of past 24 hours: For complete details please refer to admission H and P, but in brief  Wayne Buckley a 67 y.o.malewith medical history significant oftraumatic brain injury, central cord syndrome, neurogenic bladder with indwelling Foley catheter, bilateral leg weakness, prior DVT, hypertension, alcohol abuse. He presented secondary to fever. He was found to have MRSA in his urine prior to arrival. Blood cultures positive for MRSA and CT abdomen concerning for ileus.  05/17/2018: Had a bowel movement yesterday.  Vital signs reviewed and are unremarkable.  Lab studies remarkable for iron deficiency anemia which will be supplemented.  05/18/2018:  Awaiting placement to SNF.  05/19/2018: Patient seen and examined at his bedside.  No acute events overnight.  Patient has no complaints.  His abdomen appears distended however he denies abdominal pain or nausea.  Has not had a bowel movement in 3 days.  Increase frequency of his medications for bowel regimen.  Will obtain a abdominal x-ray to reassess.  Last abdominal x-ray done on 05/09/18 had revealed dilated loops of colon.  05/20/18: Has poor appetite and still no bowel movements.  Added milk of magnesia.  Denies abdominal pain or nausea.    Assessment/Plan: Principal Problem:   Ogilvie's syndrome Active Problems:   Alcohol abuse   AKI (acute kidney injury) ([place])   Central cord syndrome (HCC)   Essential hypertension   Sepsis (HCC)   Chronic anemia   Hyponatremia   Sacral ulcer (HCC)   History of DVT (deep vein thrombosis)   Traumatic brain injury ([place])   MRSA bacteremia   Bacteremia   Ileus (HCC)   Abdominal distension   Sepsis secondary to UTI and MRSA bacteremia -Patient met criteria for sepsis at the time of admission, source is secondary to UTI and MRSA bacteremia\ -Sepsis physiology is resolving  Iron  deficiency anemia saturation ratio 9, iron 16, TIBC 188 Continue ferrous sulfate 325 mg twice daily Hemoglobin 7.8 from 8.3 No sign of overt bleeding Repeat CBC today  Chronic constipation, not improving No bowel movements in 4 days Added Senokot 2 tablets twice daily, MiraLAX twice daily, Dulcolax suppository Added milk of magnesia Enema every other day Abdominal x-ray done today 05/19/2018 unchanged from prior done 05/09/2018  CKD 4 Creatinine is trending down from 2.44 to 2.16  Continue gentle IV fluid hydration Encourage patient to stay hydrated Good urine output 1100 cc recorded Avoid nephrotoxic agents as possible Repeat BMP today  MRSA bacteremia, MRSA UTI -Repeat blood cultures 11/15 showed no growth -2D echo showed no evidence of vegetations -ID consulted, initially was placed on IV vancomycin, now changed to daptomycin due to rising creatinine.  - TEE was unsuccessful due to ?obstruction, esophagogram showed no stricture -Tunneled midline placed, continue daptomycin renally dose till December 13/19  Resolving acute kidney injury with metabolic acidosis  -Vancomycin was discontinued, patient was briefly placed on IV fluids with bicarb. -Renal ultrasound did not show any obstruction or hydronephrosis. -Cr has been improving, bicarb drip was discontinued, creatinine peaked at 2.7  Ileus/abdominal distention -Patient has been followed by general surgery and gastroenterology, differentials including Ogilvie's syndrome, mechanical component - Esophagogram results discussed with GI, Dr. [name]. Per GI, patient will not be able to tolerate EGD with multiple comorbidities currently and no obvious stricture on the esophagram. -Regarding ileus, GI recommended enemas, advance diet, increase mobility, sit upright. Patient is not a candidate for neostigmine or  colonoscopy decompression. Per GI, this is commonly seen in patients with TBI and cord compression and will  spontaneously resolve. -Continue soft solids.  Patient will need good bowel regimen with enema scheduled or every other day for constipation. Had BM today 05/16/18.  Urinary retention with neurogenic bladder -Per admission history patient with indwelling Foley catheter secondary to neurogenic bladder, TBI and cord compression - follow outpatient with urology and outpatient voiding trial, patient was supposed to have appointment on 11/20. Foley catheter changed.  Resolved Leukocytosis  History of DVT, left lower extremity, diagnosed in 02/2018 Continue Eliquis  History of TBI -Continue care at skilled nursing facility  Essential hypertension BP stable, continue amlodipine and Coreg  History of alcohol use Currently stable, no acute issues  Pressure injury POA, bilateral buttocks, deep tissue injury Wound care per nursing  Thrombocytosis -Likely reactive from sepsis, bacteremia, platelet count improving, 660 -Obtain CBC today and tomorrow  Resolved hypokalemia post repletion   Consults:   Infectious disease Gastroenterology General surgery Cardiology Inpatient rehab   Allergies:  No Known Allergies  Full code    Objective: Vitals:   05/19/18 0526 05/19/18 1657 05/19/18 2038 05/20/18 0527  BP: 133/75 138/88 (!) 137/91 137/73  Pulse: 90 93 88 96  Resp: 20 18 (!) 21 20  Temp: 98.8 F (37.1 C) 98.6 F (37 C) 98.8 F (37.1 C) 98.5 F (36.9 C)  TempSrc:  Oral Oral Oral  SpO2: 96% 98% 94% 95%  Weight:   91 kg   Height:        Intake/Output Summary (Last 24 hours) at 05/20/2018 1003 Last data filed at 05/20/2018 4196 Gross per 24 hour  Intake 520 ml  Output 1600 ml  Net -1080 ml   Filed Weights   05/09/18 2140 05/17/18 2001 05/19/18 2038  Weight: 90.8 kg 90.9 kg 91 kg    Exam:  . General: 67 y.o. year-old male well-developed well-nourished in no acute distress.  Alert and oriented x3.   . Cardiovascular: Regular rate and rhythm with no  rubs or gallops.  No JVD or thyromegaly noted.  Marland Kitchen  Respiratory: Clear to auscultation with no wheezes or rales. Good inspiratory effort.  . Abdomen: Moderately distended.  Nontender on palpation.  Hypoactive bowel sounds. . Musculoskeletal: Trace lower extremity edema. 2/4 pulses in all 4 extremities. . Skin:Pressure injury, poa on bilateral buttocks . Psychiatry: Mood is appropriate for condition and setting   Data Reviewed: CBC: Recent Labs  Lab 05/14/18 0719 05/16/18 1339 05/17/18 0659  WBC 9.7 10.0 9.9  NEUTROABS  --   --  7.5  HGB 7.6* 8.3* 7.8*  HCT 24.5* 26.1* 25.5*  MCV 86.9 85.9 86.7  PLT 660* 643* 222*   Basic Metabolic Panel: Recent Labs  Lab 05/14/18 0719 05/15/18 0533 05/16/18 1339 05/17/18 0659 05/18/18 0539  NA 137 138 137 137 138  K 3.4* 4.1 4.1 3.6 3.7  CL 101 102 101 104 102  CO2 _0 GLUCOSE 89 91 90 88 104*  BUN 23 25* 24* 23 21  CREATININE 2.70* 2.45* 2.28* 2.44* 2.16*  CALCIUM 7.9* 8.3* 8.5* 8.5* 8.5*  MG  --   --   --  1.8  --   PHOS  --   --   --  4.5  --    GFR: Estimated Creatinine Clearance: 36.5 mL/min (A) (by C-G formula based on SCr of 2.16 mg/dL (H)). Liver Function Tests: Recent Labs  Lab 05/17/18 0659  AST 20  ALT 16  ALKPHOS 78  BILITOT 0.5  PROT 6.2*  ALBUMIN 2.0*   No results for input(s): LIPASE, AMYLASE in the last 168 hours. No results for input(s): AMMONIA in the last 168 hours. Coagulation Profile: No results for input(s): INR, PROTIME in the last 168 hours. Cardiac Enzymes: Recent Labs  Lab 05/18/18 0539  CKTOTAL 30*   BNP (last 3 results) No results for input(s): PROBNP in the last 8760 hours. HbA1C: No results for input(s): HGBA1C in the last 72 hours. CBG: No results for input(s): GLUCAP in the last 168 hours. Lipid Profile: No results for input(s): CHOL, HDL, LDLCALC, TRIG, CHOLHDL, LDLDIRECT in the last 72 hours. Thyroid Function Tests: No results for input(s): TSH, T4TOTAL, FREET4,  T3FREE, THYROIDAB in the last 72 hours. Anemia Panel: No results for input(s): VITAMINB12, FOLATE, FERRITIN, TIBC, IRON, RETICCTPCT in the last 72 hours. Urine analysis:    Component Value Date/Time   COLORURINE YELLOW 05/06/2018 1609   APPEARANCEUR TURBID (A) 05/06/2018 1609   LABSPEC 1.012 05/06/2018 1609   PHURINE 5.0 05/06/2018 1609   GLUCOSEU NEGATIVE 05/06/2018 1609   HGBUR NEGATIVE 05/06/2018 1609   BILIRUBINUR NEGATIVE 05/06/2018 1609   KETONESUR NEGATIVE 05/06/2018 1609   PROTEINUR NEGATIVE 05/06/2018 1609   UROBILINOGEN 0.2 05/08/2012 0314   NITRITE NEGATIVE 05/06/2018 1609   LEUKOCYTESUR NEGATIVE 05/06/2018 1609   Sepsis Labs: _0 (procalcitonin:4,lacticidven:4)  ) No results found for this or any previous visit (from the past 240 hour(s)).    Studies: Dg Abd Portable 1v  Result Date: 05/19/2018 CLINICAL DATA:  Abdominal distention. EXAM: PORTABLE ABDOMEN - 1 VIEW COMPARISON:  05/09/2018 FINDINGS: Diffuse gaseous distension of the large bowel loops again noted. The degree of bowel distention is not significantly improved from 05/09/2018. IVC filter is noted tail ting over the upper portion of the inferior vena cava. Unchanged from previous exam. IMPRESSION: 1. No significant change and dilated loops of colon. Electronically Signed   By: Kerby Moors M.D.   On: 05/19/2018 15:48    Scheduled Meds: . amLODipine  10 mg Oral Daily  . apixaban  5 mg Oral BID  . bisacodyl  10 mg Rectal Daily  . carvedilol  12.5 mg Oral BID WC  . ferrous sulfate  325 mg Oral BID WC  . liver oil-zinc oxide   Topical QID  . magnesium hydroxide  15 mL Oral Once  . polyethylene glycol  17 g Oral Daily  . senna-docusate  2 tablet Oral BID    Continuous Infusions: . DAPTOmycin (CUBICIN)  IV 730 mg (05/19/18 0851)     LOS: 18 days     Kayleen Memos, MD [facility] Pager [phone]  If 7PM-7AM, please contact night-coverage [email or web address] Password [email or web address] 05/20/2018,  10:03 AM

## 2018-05-20 NOTE — Progress Notes (Signed)
Physical Therapy Treatment Patient Details Name: Wayne Buckley MRN: [id number] DOB: 06/08/1951 Today's Date: 05/20/2018    History of Present Illness Pt is a 67 y/o male with a PMH signficant for TBI, central cord syndrome, neurogenic bladder with indwelling foley catheter, BLE weakness, prior DVT, HTN, alcohol abuse presenting to the hospital for evaluation of fever. In ED, urine culture grew MRSA and CT abdomen revealed possible large bowel dysmotility or ileus.     PT Comments    Pt progressing slowly towards physical therapy goals. Wife present during session and very helpful, especially with motivating pt to participate. Unfortunately we were limited this session by continued episodes of diarrhea. At end of session bed pan placed under pt due to the amount that was being voided. Discussed with RN and charge RN recommendation of OOB to chair later this afternoon with Maxi-Move as pt has not been OOB in several days. As pt now has a documented pressure injury, feel it is especially important for him to have a change in position OOB. Will continue to follow and progress as able per POC.   Follow Up Recommendations  SNF;Supervision/Assistance - 24 hour     Equipment Recommendations  None recommended by PT    Recommendations for Other Services       Precautions / Restrictions Precautions Precautions: Fall Precaution Comments: indwelling suprapubic catheter Restrictions Weight Bearing Restrictions: No    Mobility  Bed Mobility Overal bed mobility: Needs Assistance Bed Mobility: Rolling Rolling: Max assist;+2 for physical assistance         General bed mobility comments: Once initiating rolling, pt began having diarrhea. Focus of session shifted to changing bed linen and performing peri-care. Pt was able to reach for bed rails with hand-over-hand to initiate, and good effort to pull himself towards rail. Even with his effort however, therapists still providing max assist to complete  roll fully to the side.   Transfers                 General transfer comment: Unable to progress to OOB this session.   Ambulation/Gait             General Gait Details: Unable   Stairs             Wheelchair Mobility    Modified Rankin (Stroke Patients Only)       Balance       Sitting balance - Comments: Unable to assess                                    Cognition Arousal/Alertness: Awake/alert Behavior During Therapy: Restless;Impulsive Overall Cognitive Status: History of cognitive impairments - at baseline                                 General Comments: Wife present during session and a big motivator for pt. Would recommend wife be present for future therapy sessions      Exercises General Exercises - Upper Extremity Shoulder Flexion: 10 reps;Right;Left(alternating R and L to various targets) Elbow Flexion: AROM;Strengthening;Both;10 reps;Supine Elbow Extension: AROM;Both;10 reps;Strengthening;Supine General Exercises - Lower Extremity Heel Slides: 5 reps;Right;Left;AAROM    General Comments        Pertinent Vitals/Pain Pain Assessment: Faces Faces Pain Scale: No hurt Pain Intervention(s): Monitored during session    Home Living  Prior Function            PT Goals (current goals can now be found in the care plan section) Acute Rehab PT Goals Patient Stated Goal: Wife's goal is for pt to get OOB to chair.  PT Goal Formulation: With family Time For Goal Achievement: 05/17/18 Potential to Achieve Goals: Good Progress towards PT goals: Progressing toward goals    Frequency    Min 2X/week      PT Plan Current plan remains appropriate    Co-evaluation PT/OT/SLP Co-Evaluation/Treatment: Yes Reason for Co-Treatment: Complexity of the patient's impairments (multi-system involvement);For patient/therapist safety;Necessary to address cognition/behavior during  functional activity;To address functional/ADL transfers PT goals addressed during session: Mobility/safety with mobility;Strengthening/ROM        AM-PAC PT "6 Clicks" Mobility   Outcome Measure  Help needed turning from your back to your side while in a flat bed without using bedrails?: Total Help needed moving from lying on your back to sitting on the side of a flat bed without using bedrails?: Total Help needed moving to and from a bed to a chair (including a wheelchair)?: Total Help needed standing up from a chair using your arms (e.g., wheelchair or bedside chair)?: Total Help needed to walk in hospital room?: Total Help needed climbing 3-5 steps with a railing? : Total 6 Click Score: 6    End of Session   Activity Tolerance: Patient tolerated treatment well Patient left: in bed;with call bell/phone within reach;with nursing/sitter in room Nurse Communication: Mobility status;Need for lift equipment;Other (comment)(Lift OOB to chair later this afternoon) PT Visit Diagnosis: History of falling (Z91.81);Difficulty in walking, not elsewhere classified (R26.2)     Time: 1610-9604 PT Time Calculation (min) (ACUTE ONLY): 43 min  Charges:  $Therapeutic Activity: 8-22 mins                     Conni Slipper, PT, DPT Acute Rehabilitation Services Pager: [phone] Office: [phone]    Wayne Buckley 05/20/2018, 2:59 PM

## 2018-05-20 NOTE — Progress Notes (Signed)
Occupational Therapy Treatment Patient Details Name: Wayne Buckley MRN: [id number] DOB: 1950-12-30 Today's Date: 05/20/2018    History of present illness Pt is a 67 y/o male with a PMH signficant for TBI, central cord syndrome, neurogenic bladder with indwelling foley catheter, BLE weakness, prior DVT, HTN, alcohol abuse presenting to the hospital for evaluation of fever. In ED, urine culture grew MRSA and CT abdomen revealed possible large bowel dysmotility or ileus.    OT comments  Pt slowly progressing towards established OT goals. Upon arrival, pt supine in bed and awake with wife at bedside. Wife is very supportive and helpful to motivated patient. During bed mobility (in preparation for transfer to recliner), pt with bowel incontinence and requiring Max A +2 for toilet hygiene. Due to continued diarrhea, unable to transfer to recliner. RN made aware and discussed transfer to recliner with Mavi-Move later this afternoon; important for positional change due to documented pressure injury. Continue to recommend dc to post-acute rehab and will continue to follow acutely.     Follow Up Recommendations  SNF;Supervision/Assistance - 24 hour    Equipment Recommendations  Other (comment)(Defer to next venue)    Recommendations for Other Services      Precautions / Restrictions Precautions Precautions: Fall Precaution Comments: indwelling suprapubic catheter Restrictions Weight Bearing Restrictions: No       Mobility Bed Mobility Overal bed mobility: Needs Assistance Bed Mobility: Rolling Rolling: Max assist;+2 for physical assistance         General bed mobility comments: Once initiating rolling, pt began having diarrhea. Focus of session shifted to changing bed linen and performing peri-care. Pt was able to reach for bed rails with hand-over-hand to initiate, and good effort to pull himself towards rail. Even with his effort however, therapists still providing max assist to complete  roll fully to the side.   Transfers                 General transfer comment: Unable to progress to OOB this session.     Balance       Sitting balance - Comments: Unable to assess                                   ADL either performed or assessed with clinical judgement   ADL Overall ADL's : Needs assistance/impaired                     Lower Body Dressing: Total assistance;Bed level Lower Body Dressing Details (indicate cue type and reason): Donned socks     Toileting- Clothing Manipulation and Hygiene: Maximal assistance;+2 for physical assistance;Bed level Toileting - Clothing Manipulation Details (indicate cue type and reason): During bed mobility to transion to EOB and then recliner, pt with liquid bowel incontience. Pt requiring Max A +2 for toilet hygiene. Pt rolling left and right with Max A.        General ADL Comments: Pt with bowel incontience while in bed and requiring Max A for rolling and toilet hygiene. Pt with continues incontience and had to defer OOB activity. Discussing use of lift with RN to transfer OOB     Vision       Perception     Praxis      Cognition Arousal/Alertness: Awake/alert Behavior During Therapy: Restless;Impulsive Overall Cognitive Status: History of cognitive impairments - at baseline  General Comments: Wife present during session and a big motivator for pt. Would recommend wife be present for future therapy sessions        Exercises Exercises: General Lower Extremity;General Upper Extremity General Exercises - Upper Extremity Shoulder Flexion: 10 reps;Right;Left(alternating R and L to various targets) Elbow Flexion: AROM;Strengthening;Both;10 reps;Supine Elbow Extension: AROM;Both;10 reps;Strengthening;Supine General Exercises - Lower Extremity Heel Slides: 5 reps;Right;Left;AAROM   Shoulder Instructions       General Comments      Pertinent  Vitals/ Pain       Pain Assessment: Faces Faces Pain Scale: No hurt Pain Intervention(s): Monitored during session  Home Living                                          Prior Functioning/Environment              Frequency  Min 2X/week        Progress Toward Goals  OT Goals(current goals can now be found in the care plan section)  Progress towards OT goals: Progressing toward goals  Acute Rehab OT Goals Patient Stated Goal: Wife's goal is for pt to get OOB to chair.  OT Goal Formulation: With patient/family Time For Goal Achievement: 05/20/18 Potential to Achieve Goals: Good ADL Goals Pt Will Perform Grooming: with set-up;with supervision;sitting Pt Will Perform Upper Body Bathing: with set-up;with supervision;sitting Pt Will Perform Lower Body Bathing: with mod assist Pt Will Transfer to Toilet: with mod assist;with +2 assist;stand pivot transfer;bedside commode Additional ADL Goal #1: Pt will be able to come up to sit EOB with Mod A in prep for ADLs and tranfers OOB  Plan Discharge plan remains appropriate    Co-evaluation    PT/OT/SLP Co-Evaluation/Treatment: Yes Reason for Co-Treatment: Complexity of the patient's impairments (multi-system involvement);For patient/therapist safety;To address functional/ADL transfers PT goals addressed during session: Mobility/safety with mobility;Strengthening/ROM OT goals addressed during session: ADL's and self-care      AM-PAC OT "6 Clicks" Daily Activity     Outcome Measure   Help from another person eating meals?: A Little Help from another person taking care of personal grooming?: A Little Help from another person toileting, which includes using toliet, bedpan, or urinal?: A Lot Help from another person bathing (including washing, rinsing, drying)?: A Lot Help from another person to put on and taking off regular upper body clothing?: A Lot Help from another person to put on and taking off regular  lower body clothing?: Total 6 Click Score: 13    End of Session    OT Visit Diagnosis: Unsteadiness on feet (R26.81);Other abnormalities of gait and mobility (R26.89);Muscle weakness (generalized) (M62.81);History of falling (Z91.81);Pain;Ataxia, unspecified (R27.0);Other symptoms and signs involving cognitive function   Activity Tolerance Other (comment)(Limited by incontience)   Patient Left in bed;with call bell/phone within reach;with nursing/sitter in room   Nurse Communication Mobility status;Need for lift equipment        Time: 1332-1411 OT Time Calculation (min): 39 min  Charges: OT General Charges $OT Visit: 1 Visit OT Treatments $Self Care/Home Management : 23-37 mins  Lianette Broussard MSOT, OTR/L Acute Rehab Pager: [phone] Office: [phone]    Theodoro GristCharis M Tramon Crescenzo 05/20/2018, 3:15 PM

## 2018-05-21 NOTE — Progress Notes (Signed)
PROGRESS NOTE  [name] WHQ:[id number] DOB: [birth year] DOA: 05/02/2018 PCP: [name], MD  HPI/Recap of past 24 hours: For complete details please refer to admission H and P, but in brief  Wayne Buckley a 67 y.o.malewith medical history significant oftraumatic brain injury, central cord syndrome, neurogenic bladder with indwelling Foley catheter, bilateral leg weakness, prior DVT, hypertension, alcohol abuse. He presented secondary to fever. He was found to have MRSA in his urine prior to arrival. Blood cultures positive for MRSA and CT abdomen concerning for ileus.  Hospital course complicated by intermittent constipation improved after enema.  Recommend enema every other day.  Awaiting placement to SNF which has been challenging due to being on daptomycin expensive for SNF.  05/21/2018: Patient seen and examined at his bedside.  No acute events overnight.  Reports he feels better after having bowel movements yesterday.  Denies nausea or abdominal pain.  His abdomen feels soft with normal bowel sounds x4 quadrant.  He has no new complaints.  He has no cardiopulmonary symptoms.       Assessment/Plan: Principal Problem:   Ogilvie's syndrome Active Problems:   Alcohol abuse   AKI (acute kidney injury) ([place])   Central cord syndrome (HCC)   Essential hypertension   Sepsis (HCC)   Chronic anemia   Hyponatremia   Sacral ulcer (HCC)   History of DVT (deep vein thrombosis)   Traumatic brain injury ([place])   MRSA bacteremia   Bacteremia   Ileus (HCC)   Abdominal distension   Sepsis secondary to UTI and MRSA bacteremia -Patient met criteria for sepsis at the time of admission, source is secondary to UTI and MRSA bacteremia\ -Sepsis physiology is resolving -Repeat CBC in the morning  Iron deficiency anemia/symptomatic anemia Continue ferrous sulfate 325 mg twice daily Hemoglobin 7.5 Would benefit from 1 unit PRBC transfusion  Chronic constipation,  improving Continue with present bowel regimen  CKD 4, improving Creatinine is trending down from 2.44 to 2.16 to 1.94 Continue gentle IV fluid hydration Encourage patient to stay hydrated Good urine output   MRSA bacteremia, MRSA UTI -Repeat blood cultures 11/15 showed no growth -2D echo showed no evidence of vegetations -ID consulted, initially was placed on IV vancomycin, now changed to daptomycin due to rising creatinine.  - TEE was unsuccessful due to ?obstruction, esophagogram showed no stricture -Tunneled midline placed, continue daptomycin renally dose till December 13/19 -Difficulty finding placement due to daptomycin being expensive for SNF  Resolving acute kidney injury with metabolic acidosis  -Vancomycin was discontinued, patient was briefly placed on IV fluids with bicarb. -Renal ultrasound did not show any obstruction or hydronephrosis. -Cr has been improving, bicarb drip was discontinued, creatinine peaked at 2.7 -Creatinine improving to 1.94 from 2.16  Ileus/abdominal distention, improving -Patient has been followed by general surgery and gastroenterology, differentials including Ogilvie's syndrome, mechanical component - Esophagogram results discussed with GI, Dr. [name]. Per GI, patient will not be able to tolerate EGD with multiple comorbidities currently and no obvious stricture on the esophagram. -Regarding ileus, GI recommended enemas, soft or solid diet, increase mobility, sit upright. Patient is not a candidate for neostigmine or colonoscopy decompression. Per GI, this is commonly seen in patients with TBI and cord compression and will spontaneously resolve. -Continue soft solids.  Patient will need good bowel regimen with enema scheduled or every other day for constipation.  Urinary retention with neurogenic bladder -Per admission history patient with indwelling Foley catheter secondary to neurogenic bladder, TBI and cord compression -  follow  outpatient with urology and outpatient voiding trial, patient was supposed to have appointment on 11/20. Foley catheter changed.  Resolved Leukocytosis  History of DVT, left lower extremity, diagnosed in 02/2018 Continue Eliquis  History of TBI -Continue care at skilled nursing facility  Essential hypertension BP stable, continue amlodipine and Coreg  History of alcohol use Currently stable, no acute issues  Pressure injury POA, bilateral buttocks, deep tissue injury Wound care per nursing  Thrombocytosis -Likely reactive from sepsis, bacteremia, platelet count improving, 660 -Obtain CBC today and tomorrow  Resolved hypokalemia post repletion   Consults:   Infectious disease Gastroenterology General surgery Cardiology Inpatient rehab   Allergies:  No Known Allergies  Full code    Objective: Vitals:   05/20/18 1901 05/20/18 2139 05/21/18 0314 05/21/18 0922  BP: (!) 131/96 (!) 135/94 121/67 110/81  Pulse: 96 94 89 70  Resp: _0 Temp: 98.7 F (37.1 C) 99.3 F (37.4 C) 99.7 F (37.6 C) 98.5 F (36.9 C)  TempSrc: Oral Oral Oral Oral  SpO2: 94% 97% 97% 93%  Weight:      Height:        Intake/Output Summary (Last 24 hours) at 05/21/2018 1249 Last data filed at 05/21/2018 0933 Gross per 24 hour  Intake 300 ml  Output 1525 ml  Net -1225 ml   Filed Weights   05/09/18 2140 05/17/18 2001 05/19/18 2038  Weight: 90.8 kg 90.9 kg 91 kg    Exam:  . General: 67 y.o. year-old male well-developed well-nourished no acute distress.  Alert oriented x3.   . Cardiovascular: Regular rate and rhythm with no rubs or gallops.  No JVD or thyromegaly noted. Marland Kitchen  Respiratory: Clear to auscultation with no wheezes or rales.  Good respiratory effort. . Abdomen: Moderately distended.  Nontender on palpation.  Hypoactive bowel sounds. . Musculoskeletal: Trace lower extremity edema. 2/4 pulses in all 4 extremities. . Skin:Pressure injury, poa on bilateral  buttocks . Psychiatry: Mood is appropriate for condition and setting   Data Reviewed: CBC: Recent Labs  Lab 05/16/18 1339 05/17/18 0659 05/20/18 1033  WBC 10.0 9.9 7.4  NEUTROABS  --  7.5 5.5  HGB 8.3* 7.8* 7.5*  HCT 26.1* 25.5* 24.1*  MCV 85.9 86.7 86.4  PLT 643* 709* 888*   Basic Metabolic Panel: Recent Labs  Lab 05/15/18 0533 05/16/18 1339 05/17/18 0659 05/18/18 0539 05/20/18 1033  NA 138 137 137 138 137  K 4.1 4.1 3.6 3.7 3.5  CL 102 101 104 102 103  CO2 _1 GLUCOSE 91 90 88 104* 97  BUN 25* 24* _2 CREATININE 2.45* 2.28* 2.44* 2.16* 1.94*  CALCIUM 8.3* 8.5* 8.5* 8.5* 8.6*  MG  --   --  1.8  --   --   PHOS  --   --  4.5  --   --    GFR: Estimated Creatinine Clearance: 40.6 mL/min (A) (by C-G formula based on SCr of 1.94 mg/dL (H)). Liver Function Tests: Recent Labs  Lab 05/17/18 0659  AST 20  ALT 16  ALKPHOS 78  BILITOT 0.5  PROT 6.2*  ALBUMIN 2.0*   No results for input(s): LIPASE, AMYLASE in the last 168 hours. No results for input(s): AMMONIA in the last 168 hours. Coagulation Profile: No results for input(s): INR, PROTIME in the last 168 hours. Cardiac Enzymes: Recent Labs  Lab 05/18/18 0539  CKTOTAL 30*   BNP (last 3 results) No results for  input(s): PROBNP in the last 8760 hours. HbA1C: No results for input(s): HGBA1C in the last 72 hours. CBG: No results for input(s): GLUCAP in the last 168 hours. Lipid Profile: No results for input(s): CHOL, HDL, LDLCALC, TRIG, CHOLHDL, LDLDIRECT in the last 72 hours. Thyroid Function Tests: No results for input(s): TSH, T4TOTAL, FREET4, T3FREE, THYROIDAB in the last 72 hours. Anemia Panel: No results for input(s): VITAMINB12, FOLATE, FERRITIN, TIBC, IRON, RETICCTPCT in the last 72 hours. Urine analysis:    Component Value Date/Time   COLORURINE YELLOW 05/06/2018 1609   APPEARANCEUR TURBID (A) 05/06/2018 1609   LABSPEC 1.012 05/06/2018 1609   PHURINE 5.0 05/06/2018 1609    GLUCOSEU NEGATIVE 05/06/2018 1609   HGBUR NEGATIVE 05/06/2018 1609   BILIRUBINUR NEGATIVE 05/06/2018 1609   KETONESUR NEGATIVE 05/06/2018 1609   PROTEINUR NEGATIVE 05/06/2018 1609   UROBILINOGEN 0.2 05/08/2012 0314   NITRITE NEGATIVE 05/06/2018 1609   LEUKOCYTESUR NEGATIVE 05/06/2018 1609   Sepsis Labs: _0 (procalcitonin:4,lacticidven:4)  ) No results found for this or any previous visit (from the past 240 hour(s)).    Studies: No results found.  Scheduled Meds: . amLODipine  10 mg Oral Daily  . apixaban  5 mg Oral BID  . bisacodyl  10 mg Rectal Daily  . carvedilol  12.5 mg Oral BID WC  . ferrous sulfate  325 mg Oral BID WC  . liver oil-zinc oxide   Topical QID  . magnesium hydroxide  15 mL Oral Once  . polyethylene glycol  17 g Oral BID  . senna-docusate  2 tablet Oral BID    Continuous Infusions: . DAPTOmycin (CUBICIN)  IV 730 mg (05/21/18 1003)     LOS: 19 days     Kayleen Memos, MD [facility] Pager [phone]  If 7PM-7AM, please contact night-coverage [email or web address] Password [facility] 05/21/2018, 12:49 PM

## 2018-05-21 NOTE — Progress Notes (Signed)
Pharmacy Antibiotic Note  Wayne Buckley is a 67 y.o. male admitted on 05/02/2018 with MRSA bacteremia.   Pharmacy has been consulted for Daptomycin dosing. Blood cultures from 11/15 are NGTD. TEE was unable to be performed due to an esophageal stricture.   The patient's renal function continues to slowly improve, SCr down to 1.94, normalized CrCl~35-40 ml/min. Current Daptomycin dose remains appropriate.   Plan: - Cont Daptomycin 730 mg (~ 8 mg/kg) every 24 hours - Stop date planned for 12/13 - Weekly CK on Saturdays  Height: 5\' 9"  (175.3 cm) Weight: 200 lb 9.9 oz (91 kg) IBW/kg (Calculated) : 70.7  Temp (24hrs), Avg:99 F (37.2 C), Min:98.4 F (36.9 C), Max:99.7 F (37.6 C)  Recent Labs  Lab 05/15/18 0533 05/16/18 1339 05/17/18 0659 05/18/18 0539 05/20/18 1033  WBC  --  10.0 9.9  --  7.4  CREATININE 2.45* 2.28* 2.44* 2.16* 1.94*    Estimated Creatinine Clearance: 40.6 mL/min (A) (by C-G formula based on SCr of 1.94 mg/dL (H)).    No Known Allergies  Antimicrobials this admission: Cefepime 11/14>>11/15 Vanc 11/15>>11/22 Dapto 11/22>>12/13  11/14 Blood x 2 - 2/2 MRSA  11/15 MRSA PCR >> positive, CHG/Bactroban 11/16>>11/20 11/15 Blood x 2 - negative 11/21 Blood x 2 - negative 11/21 Blood x 2 - negative  Thank you for allowing pharmacy to be a part of this patient's care.  Georgina PillionElizabeth Izzie Geers, PharmD, BCPS Clinical Pharmacist Pager: [phone] Clinical phone for 05/21/2018 from 7a-3:30p: [phone] If after 3:30p, please call main pharmacy at: [phone] Please check AMION for all [facility] numbers 05/21/2018 8:37 AM

## 2018-05-22 LAB — CBC
HCT: 23.3 % — ABNORMAL LOW (ref 39.0–52.0)
HEMOGLOBIN: 7.2 g/dL — AB (ref 13.0–17.0)
MCH: 26.8 pg (ref 26.0–34.0)
MCHC: 30.9 g/dL (ref 30.0–36.0)
MCV: 86.6 fL (ref 80.0–100.0)
Platelets: 517 10*3/uL — ABNORMAL HIGH (ref 150–400)
RBC: 2.69 MIL/uL — AB (ref 4.22–5.81)
RDW: 14.8 % (ref 11.5–15.5)
WBC: 7.2 10*3/uL (ref 4.0–10.5)
nRBC: 0 % (ref 0.0–0.2)

## 2018-05-22 LAB — BASIC METABOLIC PANEL
ANION GAP: 11 (ref 5–15)
BUN: 18 mg/dL (ref 8–23)
CO2: 24 mmol/L (ref 22–32)
CREATININE: 1.73 mg/dL — AB (ref 0.61–1.24)
Calcium: 8.5 mg/dL — ABNORMAL LOW (ref 8.9–10.3)
Chloride: 104 mmol/L (ref 98–111)
GFR calc Af Amer: 46 mL/min — ABNORMAL LOW (ref >60)
GFR calc non Af Amer: 40 mL/min — ABNORMAL LOW (ref >60)
Glucose, Bld: 91 mg/dL (ref 70–99)
Potassium: 3 mmol/L — ABNORMAL LOW (ref 3.5–5.1)
Sodium: 139 mmol/L (ref 135–145)

## 2018-05-22 MED ORDER — POTASSIUM CHLORIDE CRYS ER 20 MEQ PO TBCR
40.0000 meq | EXTENDED_RELEASE_TABLET | Freq: Two times a day (BID) | ORAL | Status: AC
  Administered 2018-05-22 (×2): 40 meq via ORAL
  Filled 2018-05-22 (×2): qty 2

## 2018-05-22 MED ORDER — COLLAGENASE 250 UNIT/GM EX OINT
TOPICAL_OINTMENT | Freq: Every day | CUTANEOUS | Status: DC
  Administered 2018-05-22 – 2018-05-31 (×9): via TOPICAL
  Filled 2018-05-22 (×3): qty 30

## 2018-05-22 NOTE — Progress Notes (Signed)
PROGRESS NOTE    Wayne Buckley  OHF:[id number] DOB: May 16, 1951 DOA: 05/02/2018 PCP: [name], MD     Brief Narrative:  Wayne Buckley is a 67 yo male with past medical history of traumatic brain injury, central cord syndrome, neurogenic bladder with indwelling Foley catheter, bilateral leg weakness, previous history of DVT, hypertension, alcohol abuse.  He presented to the hospital secondary to fever.  He was found to have MRSA bacteremia as well as CT abdomen concerning for ileus.  He was started on IV daptomycin.  New events last 24 hours / Subjective: No acute events, states that he is sleepy.  No chest pain or shortness of breath, denies any nausea or vomiting.  Assessment & Plan:   Principal Problem:   Ogilvie's syndrome Active Problems:   Alcohol abuse   AKI (acute kidney injury) ([place])   Central cord syndrome (HCC)   Essential hypertension   Sepsis (HCC)   Chronic anemia   Hyponatremia   Sacral ulcer (HCC)   History of DVT (deep vein thrombosis)   Traumatic brain injury ([place])   MRSA bacteremia   Bacteremia   Ileus (HCC)   Abdominal distension   Sepsis secondary to UTI and MRSA bacteremia -Patient met criteria for sepsis at the time of admission, source is secondary to UTI and MRSA bacteremia -Repeat blood cultures 11/15 showed no growth -2D echo showed no evidence of vegetations -ID consulted,initially was placed on IV vancomycin, now changed to daptomycindue to rising creatinine.  -TEE was unsuccessful due to ?obstruction, esophagogram showed no stricture -Tunneled midline placed, continue daptomycin renally dose till December 13  AKI on CKD 4, improving -Creatinine is trending down -Monitor   Iron deficiency anemia/symptomatic anemia -Continue ferrous sulfate 325 mg twice daily -Monitor CBC   Ileus/abdominal distention, improving -Patient has been followed by general surgery and gastroenterology, differentials including Ogilvie's syndrome,  mechanical component -Esophagogram results discussed with GI, Dr. [name]. Per GI, patient will not be able to tolerate EGD with multiple comorbidities currently and no obvious stricture on the esophagram. -Regarding ileus,GIrecommended enemas, soft or solid diet, increase mobility, sit upright. Patient is not a candidate for neostigmine or colonoscopy decompression. Per GI, this is commonly seen in patients with TBI and cord compression and will spontaneously resolve. -Continue soft solids. Patient will need good bowel regimen with enema scheduled or every other day for constipation.  Urinary retention with neurogenic bladder -Per admission history patient with indwelling Foley catheter secondary to neurogenic bladder, TBI and cord compression -Follow outpatient with urology and outpatient voiding trial, patient was supposed to have appointment on 11/20. Foley catheter changed.  History of DVT, left lower extremity, diagnosed in 02/2018 -Continue Eliquis  History of TBI -Continue care at skilled nursing facility  Essential hypertension -BP stable, continue amlodipine and Coreg  History of alcohol use -Currently stable, no acute issues  Pressure injury -POA, bilateral buttocks, deep tissue injury -Wound care per nursing  Thrombocytosis -Likely reactive from sepsis, bacteremia, platelet count improving  Hypokalemia -Replace, trend    DVT prophylaxis: Eliquis  Code Status: Full Family Communication: No family at bedside Disposition Plan: SNF once adequately treated    Antimicrobials:  Anti-infectives (From admission, onward)   Start     Dose/Rate Route Frequency Ordered Stop   05/16/18 0800  DAPTOmycin (CUBICIN) 730 mg in sodium chloride 0.9 % IVPB     730 mg 229.2 mL/hr over 30 Minutes Intravenous Every 24 hours 05/15/18 1029 05/31/18 1759   05/16/18 0000  daptomycin (CUBICIN) IVPB     730 mg Intravenous Every 24 hours 05/15/18 1050 06/01/18 2359   05/14/18  0000  daptomycin (CUBICIN) IVPB  Status:  Discontinued     730 mg Intravenous Every 48 hours 05/14/18 1007 05/15/18    05/10/18 2000  DAPTOmycin (CUBICIN) 730 mg in sodium chloride 0.9 % IVPB  Status:  Discontinued     730 mg 229.2 mL/hr over 30 Minutes Intravenous Every 48 hours 05/10/18 1414 05/15/18 1029   05/09/18 1205  vancomycin variable dose per unstable renal function (pharmacist dosing)  Status:  Discontinued      Does not apply See admin instructions 05/09/18 1205 05/09/18 1824   05/03/18 1400  ceFEPIme (MAXIPIME) 2 g in sodium chloride 0.9 % 100 mL IVPB  Status:  Discontinued     2 g 200 mL/hr over 30 Minutes Intravenous Every 24 hours 05/02/18 1526 05/03/18 1026   05/03/18 0330  vancomycin (VANCOCIN) IVPB 750 mg/150 ml premix  Status:  Discontinued     750 mg 150 mL/hr over 60 Minutes Intravenous Every 12 hours 05/02/18 1526 05/09/18 1205   05/02/18 1500  vancomycin (VANCOCIN) IVPB 1000 mg/200 mL premix     1,000 mg 200 mL/hr over 60 Minutes Intravenous NOW 05/02/18 1448 05/02/18 1620   05/02/18 1400  vancomycin (VANCOCIN) IVPB 1000 mg/200 mL premix     1,000 mg 200 mL/hr over 60 Minutes Intravenous  Once 05/02/18 1354 05/02/18 1520   05/02/18 1400  ceFEPIme (MAXIPIME) 2 g in sodium chloride 0.9 % 100 mL IVPB     2 g 200 mL/hr over 30 Minutes Intravenous  Once 05/02/18 1354 05/02/18 1445        Objective: Vitals:   05/21/18 1706 05/21/18 2123 05/22/18 0343 05/22/18 0815  BP: 122/78 133/70 128/77 130/69  Pulse: 72 92 81 90  Resp: 18 20 18 19   Temp: 98.2 F (36.8 C) 99.6 F (37.6 C) 98.4 F (36.9 C) 98.3 F (36.8 C)  TempSrc: Oral Oral Oral Oral  SpO2: 96% 94% 94% 95%  Weight:  90.9 kg    Height:        Intake/Output Summary (Last 24 hours) at 05/22/2018 1455 Last data filed at 05/22/2018 1310 Gross per 24 hour  Intake 415 ml  Output 1200 ml  Net -785 ml   Filed Weights   05/17/18 2001 05/19/18 2038 05/21/18 2123  Weight: 90.9 kg 91 kg 90.9 kg     Examination:  General exam: Appears calm and comfortable  Respiratory system: Clear to auscultation. Respiratory effort normal. Cardiovascular system: S1 & S2 heard, RRR. No JVD, murmurs, rubs, gallops or clicks. No pedal edema. Gastrointestinal system: Abdomen is nondistended, soft and nontender. No organomegaly or masses felt. Normal bowel sounds heard. Central nervous system: Alert and oriented  Extremities: Symmetric Psychiatry: Judgement and insight appear stable   Data Reviewed: I have personally reviewed following labs and imaging studies  CBC: Recent Labs  Lab 05/16/18 1339 05/17/18 0659 05/20/18 1033 05/22/18 0737  WBC 10.0 9.9 7.4 7.2  NEUTROABS  --  7.5 5.5  --   HGB 8.3* 7.8* 7.5* 7.2*  HCT 26.1* 25.5* 24.1* 23.3*  MCV 85.9 86.7 86.4 86.6  PLT 643* 709* 539* 254*   Basic Metabolic Panel: Recent Labs  Lab 05/16/18 1339 05/17/18 0659 05/18/18 0539 05/20/18 1033 05/22/18 0737  NA 137 137 138 137 139  K 4.1 3.6 3.7 3.5 3.0*  CL 101 104 102 103 104  CO2 26 25 25  25  24  GLUCOSE 90 88 104* 97 91  BUN 24* 23 21 20 18   CREATININE 2.28* 2.44* 2.16* 1.94* 1.73*  CALCIUM 8.5* 8.5* 8.5* 8.6* 8.5*  MG  --  1.8  --   --   --   PHOS  --  4.5  --   --   --    GFR: Estimated Creatinine Clearance: 45.5 mL/min (A) (by C-G formula based on SCr of 1.73 mg/dL (H)). Liver Function Tests: Recent Labs  Lab 05/17/18 0659  AST 20  ALT 16  ALKPHOS 78  BILITOT 0.5  PROT 6.2*  ALBUMIN 2.0*   No results for input(s): LIPASE, AMYLASE in the last 168 hours. No results for input(s): AMMONIA in the last 168 hours. Coagulation Profile: No results for input(s): INR, PROTIME in the last 168 hours. Cardiac Enzymes: Recent Labs  Lab 05/18/18 0539  CKTOTAL 30*   BNP (last 3 results) No results for input(s): PROBNP in the last 8760 hours. HbA1C: No results for input(s): HGBA1C in the last 72 hours. CBG: No results for input(s): GLUCAP in the last 168 hours. Lipid  Profile: No results for input(s): CHOL, HDL, LDLCALC, TRIG, CHOLHDL, LDLDIRECT in the last 72 hours. Thyroid Function Tests: No results for input(s): TSH, T4TOTAL, FREET4, T3FREE, THYROIDAB in the last 72 hours. Anemia Panel: No results for input(s): VITAMINB12, FOLATE, FERRITIN, TIBC, IRON, RETICCTPCT in the last 72 hours. Sepsis Labs: No results for input(s): PROCALCITON, LATICACIDVEN in the last 168 hours.  No results found for this or any previous visit (from the past 240 hour(s)).     Radiology Studies: No results found.    Scheduled Meds: . amLODipine  10 mg Oral Daily  . apixaban  5 mg Oral BID  . bisacodyl  10 mg Rectal Daily  . carvedilol  12.5 mg Oral BID WC  . collagenase   Topical Daily  . ferrous sulfate  325 mg Oral BID WC  . liver oil-zinc oxide   Topical QID  . magnesium hydroxide  15 mL Oral Once  . polyethylene glycol  17 g Oral BID  . potassium chloride  40 mEq Oral BID  . senna-docusate  2 tablet Oral BID   Continuous Infusions: . DAPTOmycin (CUBICIN)  IV 730 mg (05/22/18 0840)     LOS: 20 days    Time spent: 45 minutes   Dessa Phi, DO [facility] [email or web address] Password [email or web address] 05/22/2018, 2:55 PM

## 2018-05-22 NOTE — Progress Notes (Addendum)
Occupational Therapy Treatment Patient Details Name: Wayne Buckley MRN: [id number] DOB: 19-May-1951 Today's Date: 05/22/2018    History of present illness Pt is a 67 y/o male with a PMH signficant for TBI, central cord syndrome, neurogenic bladder with indwelling foley catheter, BLE weakness, prior DVT, HTN, alcohol abuse presenting to the hospital for evaluation of fever. In ED, urine culture grew MRSA and CT abdomen revealed possible large bowel dysmotility or ileus.    OT comments  Pt progressing towards established OT goals. Pt requiring Max A +2 for sit<>stand with stedy and requires Max cues for sequencing and following commands. Pt participating in BUE/BLE exercises while seated in recliner. Discussed with RN need for OOB to recliner each day and use of Maxi-Move for safe transfer. Continue to recommend dc to post-acute rehab and will continue to follow acutely as admitted.    Follow Up Recommendations  SNF;Supervision/Assistance - 24 hour    Equipment Recommendations  Other (comment)(Defer to next venue)    Recommendations for Other Services      Precautions / Restrictions Precautions Precautions: Fall Precaution Comments: indwelling suprapubic catheter Restrictions Weight Bearing Restrictions: No       Mobility Bed Mobility Overal bed mobility: Needs Assistance Bed Mobility: Rolling Rolling: Max assist;+2 for physical assistance Sidelying to sit: +2 for physical assistance;HOB elevated;Max assist       General bed mobility comments: Pt was able to reach for bed rails with hand-over-hand to initiate, and good effort to pull himself towards rail. Even with his effort however, therapists still providing max assist to complete roll fully to the side and transition to EOB. Rolled multiple times at beginning of session for peri-care  Transfers Overall transfer level: Needs assistance   Transfers: Sit to/from Stand Sit to Stand: +2 physical assistance;Max assist          General transfer comment: VC's for sequencing and safety. Pt required +2 max assist for power-up to full stand from elevated surface. Increased effort to place hands on center bar of Stedy and elevate trunk up so he was not also leaning on the bar.     Balance Overall balance assessment: Needs assistance Sitting-balance support: Bilateral upper extremity supported;Feet supported Sitting balance-[name] Scale: Poor Sitting balance - Comments: Unable to assess   Standing balance support: Bilateral upper extremity supported;During functional activity Standing balance-[name] Scale: Zero Standing balance comment: +2 assist required                           ADL either performed or assessed with clinical judgement   ADL Overall ADL's : Needs assistance/impaired                         Toilet Transfer: +2 for physical assistance;Maximal assistance(stedy; simulated to recliner)   Toileting- Clothing Manipulation and Hygiene: Maximal assistance;+2 for physical assistance;Bed level Toileting - Clothing Manipulation Details (indicate cue type and reason): Max A +2 for toilet hygiene at bed level.     Functional mobility during ADLs: Maximal assistance;+2 for physical assistance(sit<>stand with stedy) General ADL Comments: Pt rwquiring Max A for functional transfer and presenting with decrease sequencing this session. Wife present and very help for encouragement.     Vision       Perception     Praxis      Cognition Arousal/Alertness: Awake/alert Behavior During Therapy: Restless;Impulsive Overall Cognitive Status: History of cognitive impairments - at baseline  General Comments: Wife present during session and a big motivator for pt. Would recommend wife be present for future therapy sessions        Exercises Exercises: General Lower Extremity;General Upper Extremity General Exercises - Upper Extremity Shoulder  Flexion: 10 reps;Right;Left(alternating R and L to various targets) Shoulder ABduction: AAROM;Both;10 reps;Seated General Exercises - Lower Extremity Ankle Circles/Pumps: Strengthening;10 reps;Both Long Arc Quad: AROM;Both;5 reps Heel Slides: 5 reps;Right;Left;AAROM   Shoulder Instructions       General Comments      Pertinent Vitals/ Pain       Pain Assessment: Faces Faces Pain Scale: Hurts little more Pain Location: General moaning and grimacing with rolling Pain Descriptors / Indicators: Grimacing;Moaning Pain Intervention(s): Monitored during session;Limited activity within patient's tolerance;Repositioned  Home Living                                          Prior Functioning/Environment              Frequency  Min 2X/week        Progress Toward Goals  OT Goals(current goals can now be found in the care plan section)  Progress towards OT goals: Progressing toward goals  Acute Rehab OT Goals Patient Stated Goal: Wife's goal is for pt to get OOB to chair daily.  OT Goal Formulation: With patient/family Time For Goal Achievement: 05/20/18 Potential to Achieve Goals: Good ADL Goals Pt Will Perform Grooming: with set-up;with supervision;sitting Pt Will Perform Upper Body Bathing: with set-up;with supervision;sitting Pt Will Perform Lower Body Bathing: with mod assist Pt Will Transfer to Toilet: with mod assist;with +2 assist;stand pivot transfer;bedside commode Additional ADL Goal #1: Pt will be able to come up to sit EOB with Mod A in prep for ADLs and tranfers OOB  Plan Discharge plan remains appropriate    Co-evaluation    PT/OT/SLP Co-Evaluation/Treatment: Yes Reason for Co-Treatment: Complexity of the patient's impairments (multi-system involvement) PT goals addressed during session: Mobility/safety with mobility;Balance;Strengthening/ROM;Proper use of DME OT goals addressed during session: ADL's and self-care      AM-PAC OT "6  Clicks" Daily Activity     Outcome Measure   Help from another person eating meals?: A Little Help from another person taking care of personal grooming?: A Little Help from another person toileting, which includes using toliet, bedpan, or urinal?: A Lot Help from another person bathing (including washing, rinsing, drying)?: A Lot Help from another person to put on and taking off regular upper body clothing?: A Lot Help from another person to put on and taking off regular lower body clothing?: Total 6 Click Score: 13    End of Session Equipment Utilized During Treatment: Gait belt;Other (comment)(stedy)  OT Visit Diagnosis: Unsteadiness on feet (R26.81);Other abnormalities of gait and mobility (R26.89);Muscle weakness (generalized) (M62.81);History of falling (Z91.81);Pain;Ataxia, unspecified (R27.0);Other symptoms and signs involving cognitive function Pain - part of body: (Generalized)   Activity Tolerance Patient tolerated treatment well   Patient Left in chair;with call bell/phone within reach;with chair alarm set;with family/visitor present   Nurse Communication Mobility status;Need for lift equipment        Time: 1350-1413 OT Time Calculation (min): 23 min  Charges: OT General Charges $OT Visit: 1 Visit OT Treatments $Self Care/Home Management : 8-22 mins  Karlei Waldo MSOT, OTR/L Acute Rehab Pager: [phone] Office: [phone]   Theodoro GristCharis M Maymunah Stegemann 05/22/2018, 3:28 PM

## 2018-05-22 NOTE — Consult Note (Signed)
WOC consult requested to renew order for Santyl for buttocks unstageable pressure injury; which was noted as present on admission.  Refer to previous WOC consult note on 11/18.  Santyl order renewed to provide enzymatic debridement of nonviable tissue. Please re-consult if further assistance is needed.  Thank-you,  Cammie Mcgeeawn Geanie Pacifico MSN, RN, CWOCN, [place], CNS [phone]

## 2018-05-22 NOTE — Progress Notes (Signed)
Physical Therapy Treatment Patient Details Name: Wayne Buckley MRN: [id number] DOB: September 14, 1950 Today's Date: 05/22/2018    History of Present Illness Pt is a 68 y/o male with a PMH signficant for TBI, central cord syndrome, neurogenic bladder with indwelling foley catheter, BLE weakness, prior DVT, HTN, alcohol abuse presenting to the hospital for evaluation of fever. In ED, urine culture grew MRSA and CT abdomen revealed possible large bowel dysmotility or ileus.     PT Comments    Pt progressing towards physical therapy goals. Goal of session was OOB to chair with Stedy. Pt demonstrated increased trunk control with sidelying to sit and sitting balance, however, still requires +2 max assist for all aspects of mobility. Wife present throughout session and continues to be supportive. Will continue to follow and progress as able per POC.   Follow Up Recommendations  SNF;Supervision/Assistance - 24 hour     Equipment Recommendations  None recommended by PT    Recommendations for Other Services OT consult     Precautions / Restrictions Precautions Precautions: Fall Precaution Comments: indwelling suprapubic catheter Restrictions Weight Bearing Restrictions: No    Mobility  Bed Mobility Overal bed mobility: Needs Assistance Bed Mobility: Rolling Rolling: Max assist;+2 for physical assistance Sidelying to sit: +2 for physical assistance;HOB elevated;Max assist       General bed mobility comments: Pt was able to reach for bed rails with hand-over-hand to initiate, and good effort to pull himself towards rail. Even with his effort however, therapists still providing max assist to complete roll fully to the side and transition to EOB. Rolled multiple times at beginning of session for peri-care  Transfers Overall transfer level: Needs assistance   Transfers: Sit to/from Stand Sit to Stand: +2 physical assistance;Max assist         General transfer comment: VC's for sequencing and  safety. Pt required +2 max assist for power-up to full stand from elevated surface. Increased effort to place hands on center bar of Stedy and elevate trunk up so he was not also leaning on the bar.   Ambulation/Gait             General Gait Details: Unable   Social research officer, [employer] (Stroke Patients Only)       Balance Overall balance assessment: Needs assistance Sitting-balance support: Bilateral upper extremity supported;Feet supported Sitting balance-[name] Scale: Poor Sitting balance - Comments: Unable to assess   Standing balance support: Bilateral upper extremity supported;During functional activity Standing balance-[name] Scale: Zero Standing balance comment: +2 assist required                            Cognition Arousal/Alertness: Awake/alert Behavior During Therapy: Restless;Impulsive Overall Cognitive Status: History of cognitive impairments - at baseline                                 General Comments: Wife present during session and a big motivator for pt. Would recommend wife be present for future therapy sessions      Exercises General Exercises - Upper Extremity Shoulder Flexion: 10 reps;Right;Left(alternating R and L to various targets) Shoulder ABduction: AAROM;Both;10 reps;Seated General Exercises - Lower Extremity Ankle Circles/Pumps: Strengthening;10 reps;Both Long Arc Quad: AROM;Both;5 reps Heel Slides: 5 reps;Right;Left;AAROM    General Comments  Pertinent Vitals/Pain Pain Assessment: Faces Faces Pain Scale: Hurts little more Pain Location: General moaning and grimacing with rolling Pain Descriptors / Indicators: Grimacing;Moaning Pain Intervention(s): Monitored during session    Home Living                      Prior Function            PT Goals (current goals can now be found in the care plan section) Acute Rehab PT Goals Patient Stated Goal:  Wife's goal is for pt to get OOB to chair daily.  PT Goal Formulation: With family Time For Goal Achievement: 05/17/18 Potential to Achieve Goals: Good Progress towards PT goals: Progressing toward goals    Frequency    Min 2X/week      PT Plan Current plan remains appropriate    Co-evaluation PT/OT/SLP Co-Evaluation/Treatment: Yes Reason for Co-Treatment: Complexity of the patient's impairments (multi-system involvement);For patient/therapist safety;To address functional/ADL transfers PT goals addressed during session: Mobility/safety with mobility;Balance;Strengthening/ROM;Proper use of DME        AM-PAC PT "6 Clicks" Mobility   Outcome Measure  Help needed turning from your back to your side while in a flat bed without using bedrails?: Total Help needed moving from lying on your back to sitting on the side of a flat bed without using bedrails?: Total Help needed moving to and from a bed to a chair (including a wheelchair)?: Total Help needed standing up from a chair using your arms (e.g., wheelchair or bedside chair)?: Total Help needed to walk in hospital room?: Total Help needed climbing 3-5 steps with a railing? : Total 6 Click Score: 6    End of Session Equipment Utilized During Treatment: Gait belt Activity Tolerance: Patient tolerated treatment well Patient left: in bed;with call bell/phone within reach;with nursing/sitter in room Nurse Communication: Mobility status;Need for lift equipment;Other (comment)(Lift OOB to chair between therapy sessions) PT Visit Diagnosis: History of falling (Z91.81);Difficulty in walking, not elsewhere classified (R26.2)     Time: [id number] PT Time Calculation (min) (ACUTE ONLY): 41 min  Charges:  $Therapeutic Activity: 23-37 mins                     Conni SlipperLaura Nature Kueker, PT, DPT Acute Rehabilitation Services Pager: [phone] Office: [phone]    Wayne PearsonLaura D Breven Guidroz 05/22/2018, 2:36 PM

## 2018-05-23 LAB — CBC
HCT: 23.9 % — ABNORMAL LOW (ref 39.0–52.0)
Hemoglobin: 7.3 g/dL — ABNORMAL LOW (ref 13.0–17.0)
MCH: 26.7 pg (ref 26.0–34.0)
MCHC: 30.5 g/dL (ref 30.0–36.0)
MCV: 87.5 fL (ref 80.0–100.0)
Platelets: 551 10*3/uL — ABNORMAL HIGH (ref 150–400)
RBC: 2.73 MIL/uL — ABNORMAL LOW (ref 4.22–5.81)
RDW: 14.9 % (ref 11.5–15.5)
WBC: 7.6 10*3/uL (ref 4.0–10.5)
nRBC: 0 % (ref 0.0–0.2)

## 2018-05-23 LAB — BASIC METABOLIC PANEL
Anion gap: 10 (ref 5–15)
BUN: 17 mg/dL (ref 8–23)
CO2: 23 mmol/L (ref 22–32)
Calcium: 8.5 mg/dL — ABNORMAL LOW (ref 8.9–10.3)
Chloride: 106 mmol/L (ref 98–111)
Creatinine, Ser: 1.59 mg/dL — ABNORMAL HIGH (ref 0.61–1.24)
GFR calc non Af Amer: 44 mL/min — ABNORMAL LOW (ref >60)
GFR, EST AFRICAN AMERICAN: 51 mL/min — AB (ref >60)
Glucose, Bld: 94 mg/dL (ref 70–99)
Potassium: 3.7 mmol/L (ref 3.5–5.1)
Sodium: 139 mmol/L (ref 135–145)

## 2018-05-23 MED ORDER — BISACODYL 10 MG RE SUPP: 10.0000 mg | Freq: Every day | RECTAL | Status: DC | PRN

## 2018-05-23 MED ORDER — SODIUM CHLORIDE 0.9 % IV SOLN
INTRAVENOUS | Status: DC | PRN
  Administered 2018-05-23 – 2018-05-25 (×2): 500 mL via INTRAVENOUS

## 2018-05-23 MED ORDER — SENNOSIDES-DOCUSATE SODIUM 8.6-50 MG PO TABS: 1.0000 | ORAL_TABLET | Freq: Every evening | ORAL | Status: DC | PRN

## 2018-05-23 MED ORDER — POLYETHYLENE GLYCOL 3350 17 G PO PACK: 17.0000 g | PACK | Freq: Every day | ORAL | Status: DC | PRN

## 2018-05-23 NOTE — Progress Notes (Signed)
PROGRESS NOTE    Wayne Buckley  VFI:[id number] DOB: [birth year] DOA: 05/02/2018 PCP: [name], MD     Brief Narrative:  Wayne Buckley is a 67 yo male with past medical history of traumatic brain injury, central cord syndrome, neurogenic bladder with indwelling Foley catheter, bilateral leg weakness, previous history of DVT, hypertension, alcohol abuse.  He presented to the hospital secondary to fever.  He was found to have MRSA bacteremia as well as CT abdomen concerning for ileus.  He was started on IV daptomycin.  New events last 24 hours / Subjective: No acute complaints today.  States that he is sleeping.  Denies any chest pain or shortness of breath, no nausea, vomiting.  Assessment & Plan:   Principal Problem:   Ogilvie's syndrome Active Problems:   Alcohol abuse   AKI (acute kidney injury) ([place])   Central cord syndrome (HCC)   Essential hypertension   Sepsis (HCC)   Chronic anemia   Hyponatremia   Sacral ulcer (HCC)   History of DVT (deep vein thrombosis)   Traumatic brain injury ([facility])   MRSA bacteremia   Bacteremia   Ileus (HCC)   Abdominal distension   Sepsis secondary to UTI and MRSA bacteremia -Patient met criteria for sepsis at the time of admission, source is secondary to UTI and MRSA bacteremia -Repeat blood cultures 11/15 showed no growth -2D echo showed no evidence of vegetations -ID consulted,initially was placed on IV vancomycin, now changed to daptomycindue to rising creatinine.  -TEE was unsuccessful due to ?obstruction, esophagogram showed no stricture -Tunneled midline placed, continue daptomycin renally dose till December 13 -Discussed with infectious disease on-call today, due to his renal dysfunction, daptomycin is best option for patient.  AKI on CKD 4, improving -Creatinine is trending down, Cr 1.59 today -Monitor   Iron deficiency anemia/symptomatic anemia -Continue ferrous sulfate 325 mg twice daily -Monitor CBC    Ileus/abdominal distention, improving -Patient has been followed by general surgery and gastroenterology, differentials including Ogilvie's syndrome, mechanical component -Esophagogram results discussed with GI, Dr. [name]. Per GI, patient will not be able to tolerate EGD with multiple comorbidities currently and no obvious stricture on the esophagram. -Regarding ileus,GIrecommended enemas, soft or solid diet, increase mobility, sit upright. Patient is not a candidate for neostigmine or colonoscopy decompression. Per GI, this is commonly seen in patients with TBI and cord compression and will spontaneously resolve. -Continue soft solids. Patient will need good bowel regimen with enema scheduled or every other day for constipation.  Urinary retention with neurogenic bladder -Per admission history patient with indwelling Foley catheter secondary to neurogenic bladder, TBI and cord compression -Follow outpatient with urology and outpatient voiding trial, patient was supposed to have appointment on 11/20. Foley catheter changed.  History of DVT, left lower extremity, diagnosed in 02/2018 -Continue Eliquis  History of TBI -Continue care at skilled nursing facility  Essential hypertension -BP stable, continue amlodipine and Coreg  History of alcohol use -Currently stable, no acute issues  Pressure injury -POA, bilateral buttocks, deep tissue injury -Wound care per nursing  Thrombocytosis -Likely reactive from sepsis, bacteremia, platelet count improving    DVT prophylaxis: Eliquis  Code Status: Full Family Communication: No family at bedside Disposition Plan: SNF once adequately treated with IV daptomycin   Antimicrobials:  Anti-infectives (From admission, onward)   Start     Dose/Rate Route Frequency Ordered Stop   05/16/18 0800  DAPTOmycin (CUBICIN) 730 mg in sodium chloride 0.9 % IVPB     730  mg 229.2 mL/hr over 30 Minutes Intravenous Every 24 hours 05/15/18  1029 05/31/18 1759   05/16/18 0000  daptomycin (CUBICIN) IVPB     730 mg Intravenous Every 24 hours 05/15/18 1050 06/01/18 2359   05/14/18 0000  daptomycin (CUBICIN) IVPB  Status:  Discontinued     730 mg Intravenous Every 48 hours 05/14/18 1007 05/15/18    05/10/18 2000  DAPTOmycin (CUBICIN) 730 mg in sodium chloride 0.9 % IVPB  Status:  Discontinued     730 mg 229.2 mL/hr over 30 Minutes Intravenous Every 48 hours 05/10/18 1414 05/15/18 1029   05/09/18 1205  vancomycin variable dose per unstable renal function (pharmacist dosing)  Status:  Discontinued      Does not apply See admin instructions 05/09/18 1205 05/09/18 1824   05/03/18 1400  ceFEPIme (MAXIPIME) 2 g in sodium chloride 0.9 % 100 mL IVPB  Status:  Discontinued     2 g 200 mL/hr over 30 Minutes Intravenous Every 24 hours 05/02/18 1526 05/03/18 1026   05/03/18 0330  vancomycin (VANCOCIN) IVPB 750 mg/150 ml premix  Status:  Discontinued     750 mg 150 mL/hr over 60 Minutes Intravenous Every 12 hours 05/02/18 1526 05/09/18 1205   05/02/18 1500  vancomycin (VANCOCIN) IVPB 1000 mg/200 mL premix     1,000 mg 200 mL/hr over 60 Minutes Intravenous NOW 05/02/18 1448 05/02/18 1620   05/02/18 1400  vancomycin (VANCOCIN) IVPB 1000 mg/200 mL premix     1,000 mg 200 mL/hr over 60 Minutes Intravenous  Once 05/02/18 1354 05/02/18 1520   05/02/18 1400  ceFEPIme (MAXIPIME) 2 g in sodium chloride 0.9 % 100 mL IVPB     2 g 200 mL/hr over 30 Minutes Intravenous  Once 05/02/18 1354 05/02/18 1445       Objective: Vitals:   05/22/18 1729 05/22/18 2108 05/23/18 0623 05/23/18 0827  BP: 115/70 113/71 127/76 130/69  Pulse: 93 87 86 87  Resp: 18 18 18 19   Temp: 98 F (36.7 C) 100.2 F (37.9 C) 98.3 F (36.8 C) 98.7 F (37.1 C)  TempSrc: Oral Oral Oral Oral  SpO2: 95% 92% 97% 98%  Weight:      Height:        Intake/Output Summary (Last 24 hours) at 05/23/2018 1301 Last data filed at 05/23/2018 0820 Gross per 24 hour  Intake 644.2 ml   Output 1900 ml  Net -1255.8 ml   Filed Weights   05/17/18 2001 05/19/18 2038 05/21/18 2123  Weight: 90.9 kg 91 kg 90.9 kg    Examination: General exam: Appears calm and comfortable  Respiratory system: Clear to auscultation. Respiratory effort normal. Cardiovascular system: S1 & S2 heard, RRR. No JVD, murmurs, rubs, gallops or clicks. No pedal edema. Gastrointestinal system: Abdomen is nondistended, soft and nontender. No organomegaly or masses felt. Normal bowel sounds heard. Central nervous system: Alert and oriented Extremities: Symmetric  Psychiatry: Judgement and insight appear stable   Data Reviewed: I have personally reviewed following labs and imaging studies  CBC: Recent Labs  Lab 05/16/18 1339 05/17/18 0659 05/20/18 1033 05/22/18 0737 05/23/18 0544  WBC 10.0 9.9 7.4 7.2 7.6  NEUTROABS  --  7.5 5.5  --   --   HGB 8.3* 7.8* 7.5* 7.2* 7.3*  HCT 26.1* 25.5* 24.1* 23.3* 23.9*  MCV 85.9 86.7 86.4 86.6 87.5  PLT 643* 709* 539* 517* 762*   Basic Metabolic Panel: Recent Labs  Lab 05/17/18 0659 05/18/18 0539 05/20/18 1033 05/22/18 0737 05/23/18 0544  NA 137 138 137 139 139  K 3.6 3.7 3.5 3.0* 3.7  CL 104 102 103 104 106  CO2 25 25 25 24 23   GLUCOSE 88 104* 97 91 94  BUN 23 21 20 18 17   CREATININE 2.44* 2.16* 1.94* 1.73* 1.59*  CALCIUM 8.5* 8.5* 8.6* 8.5* 8.5*  MG 1.8  --   --   --   --   PHOS 4.5  --   --   --   --    GFR: Estimated Creatinine Clearance: 49.6 mL/min (A) (by C-G formula based on SCr of 1.59 mg/dL (H)). Liver Function Tests: Recent Labs  Lab 05/17/18 0659  AST 20  ALT 16  ALKPHOS 78  BILITOT 0.5  PROT 6.2*  ALBUMIN 2.0*   No results for input(s): LIPASE, AMYLASE in the last 168 hours. No results for input(s): AMMONIA in the last 168 hours. Coagulation Profile: No results for input(s): INR, PROTIME in the last 168 hours. Cardiac Enzymes: Recent Labs  Lab 05/18/18 0539  CKTOTAL 30*   BNP (last 3 results) No results for  input(s): PROBNP in the last 8760 hours. HbA1C: No results for input(s): HGBA1C in the last 72 hours. CBG: No results for input(s): GLUCAP in the last 168 hours. Lipid Profile: No results for input(s): CHOL, HDL, LDLCALC, TRIG, CHOLHDL, LDLDIRECT in the last 72 hours. Thyroid Function Tests: No results for input(s): TSH, T4TOTAL, FREET4, T3FREE, THYROIDAB in the last 72 hours. Anemia Panel: No results for input(s): VITAMINB12, FOLATE, FERRITIN, TIBC, IRON, RETICCTPCT in the last 72 hours. Sepsis Labs: No results for input(s): PROCALCITON, LATICACIDVEN in the last 168 hours.  No results found for this or any previous visit (from the past 240 hour(s)).     Radiology Studies: No results found.    Scheduled Meds: . amLODipine  10 mg Oral Daily  . apixaban  5 mg Oral BID  . carvedilol  12.5 mg Oral BID WC  . collagenase   Topical Daily  . ferrous sulfate  325 mg Oral BID WC  . liver oil-zinc oxide   Topical QID   Continuous Infusions: . sodium chloride 500 mL (05/23/18 1141)  . DAPTOmycin (CUBICIN)  IV 730 mg (05/22/18 1744)     LOS: 21 days    Time spent: 30 minutes   Dessa Phi, DO [facility] [email or web address] Password [email or web address] 05/23/2018, 1:01 PM

## 2018-05-23 NOTE — Progress Notes (Signed)
Pt refused wound care. Will advise oncoming RN to try later in shift.

## 2018-05-24 LAB — CK: Total CK: 41 U/L — ABNORMAL LOW (ref 49–397)

## 2018-05-24 NOTE — Progress Notes (Signed)
Pharmacy Antibiotic Note  Wayne Buckley is a 67 y.o. male admitted on 05/02/2018 with MRSA bacteremia.   Pharmacy has been consulted for Daptomycin dosing. Blood cultures from 11/15 are NEG. TEE was unable to be performed due to an esophageal stricture.   The patient's renal function continues to slowly improve, SCr down to 1.59, normalized CrCl~40-50 ml/min. Current Daptomycin dose remains appropriate. CK is 41 and normal.  Plan: - Cont Daptomycin 730 mg (~ 8 mg/kg) IV every 24 hours - Stop date planned for 12/13 - Next CK is 12/9  Height: 5\' 9"  (175.3 cm) Weight: 199 lb 4.7 oz (90.4 kg) IBW/kg (Calculated) : 70.7  Temp (24hrs), Avg:98 F (36.7 C), Min:97.4 F (36.3 C), Max:98.4 F (36.9 C)  Recent Labs  Lab 05/18/18 0539 05/20/18 1033 05/22/18 0737 05/23/18 0544  WBC  --  7.4 7.2 7.6  CREATININE 2.16* 1.94* 1.73* 1.59*    Estimated Creatinine Clearance: 49.4 mL/min (A) (by C-G formula based on SCr of 1.59 mg/dL (H)).    No Known Allergies  Antimicrobials this admission: Cefepime 11/14>>11/15 Vanc 11/15>>11/22 Dapto 11/22>>12/13  11/14 Blood x 2 - 2/2 MRSA  11/15 MRSA PCR >> positive, CHG/Bactroban 11/16>>11/20 11/15 Blood x 2 - negative 11/21 Blood x 2 - negative 11/21 Blood x 2 - negative  Thank you for allowing pharmacy to be a part of this patient's care.  Loura BackJennifer [place], PharmD, BCPS Clinical Pharmacist Clinical phone for 05/24/2018 until 3p is [phone] 05/24/2018 10:03 AM  **Pharmacist phone directory can now be found on [email or web address] listed under [facility]**

## 2018-05-24 NOTE — Progress Notes (Signed)
PROGRESS NOTE    [name]  URK:[id number] DOB: 12-10-1950 DOA: 05/02/2018 PCP: [name], MD     Brief Narrative:  Wayne Buckley is a 67 yo male with past medical history of traumatic brain injury, central cord syndrome, neurogenic bladder with indwelling Foley catheter, bilateral leg weakness, previous history of DVT, hypertension, alcohol abuse.  He presented to the hospital secondary to fever.  He was found to have MRSA bacteremia as well as CT abdomen concerning for ileus.  He was started on IV daptomycin.  New events last 24 hours / Subjective: No fevers, chills, chest pain, shortness of breath, nausea, vomiting.   Assessment & Plan:   Principal Problem:   MRSA bacteremia Active Problems:   Alcohol abuse   AKI (acute kidney injury) ([place])   Central cord syndrome (HCC)   Essential hypertension   Sepsis ([place])   Ogilvie's syndrome   Chronic anemia   Hyponatremia   Sacral ulcer (HCC)   History of DVT (deep vein thrombosis)   Traumatic brain injury (Wayne Buckley)   Bacteremia   Ileus (HCC)   Abdominal distension   Sepsis secondary to UTI and MRSA bacteremia -Patient met criteria for sepsis at the time of admission, source is secondary to UTI and MRSA bacteremia -Repeat blood cultures 11/15 showed no growth -2D echo showed no evidence of vegetations -ID consulted,initially was placed on IV vancomycin, now changed to daptomycindue to rising creatinine.  -TEE was unsuccessful due to ?obstruction, esophagogram showed no stricture -Tunneled midline placed, continue daptomycin renally dose till December 13 -Discussed with infectious disease on-call 12/5, due to his renal dysfunction, daptomycin is best option for patient.  AKI on CKD 4, improving -Creatinine is trending down -Monitor   Iron deficiency anemia/symptomatic anemia -Continue ferrous sulfate 325 mg twice daily -Monitor CBC   Ileus/abdominal distention, improving -Patient has been followed by general  surgery and gastroenterology, differentials including Ogilvie's syndrome, mechanical component -Esophagogram results discussed with GI, Dr. [name]. Per GI, patient will not be able to tolerate EGD with multiple comorbidities currently and no obvious stricture on the esophagram. -Regarding ileus,GIrecommended enemas, soft or solid diet, increase mobility, sit upright. Patient is not a candidate for neostigmine or colonoscopy decompression. Per GI, this is commonly seen in patients with TBI and cord compression and will spontaneously resolve. -Continue soft solids. Patient will need good bowel regimen with enema scheduled or every other day for constipation.  Urinary retention with neurogenic bladder -Per admission history patient with indwelling Foley catheter secondary to neurogenic bladder, TBI and cord compression -Follow outpatient with urology and outpatient voiding trial, patient was supposed to have appointment on 11/20. Foley catheter changed.  History of DVT, left lower extremity, diagnosed in 02/2018 -Continue Eliquis  History of TBI -Continue care at skilled nursing facility  Essential hypertension -BP stable, continue amlodipine and Coreg  History of alcohol use -Currently stable, no acute issues  Pressure injury -POA, bilateral buttocks, deep tissue injury -Wound care per nursing  Thrombocytosis -Likely reactive from sepsis, bacteremia, platelet count improving    DVT prophylaxis: Eliquis  Code Status: Full Family Communication: No family at bedside Disposition Plan: SNF once adequately treated with IV daptomycin   Antimicrobials:  Anti-infectives (From admission, onward)   Start     Dose/Rate Route Frequency Ordered Stop   05/16/18 0800  DAPTOmycin (CUBICIN) 730 mg in sodium chloride 0.9 % IVPB     730 mg 229.2 mL/hr over 30 Minutes Intravenous Every 24 hours 05/15/18 1029 05/31/18 1759  05/16/18 0000  daptomycin (CUBICIN) IVPB     730 mg  Intravenous Every 24 hours 05/15/18 1050 06/01/18 2359   05/14/18 0000  daptomycin (CUBICIN) IVPB  Status:  Discontinued     730 mg Intravenous Every 48 hours 05/14/18 1007 05/15/18    05/10/18 2000  DAPTOmycin (CUBICIN) 730 mg in sodium chloride 0.9 % IVPB  Status:  Discontinued     730 mg 229.2 mL/hr over 30 Minutes Intravenous Every 48 hours 05/10/18 1414 05/15/18 1029   05/09/18 1205  vancomycin variable dose per unstable renal function (pharmacist dosing)  Status:  Discontinued      Does not apply See admin instructions 05/09/18 1205 05/09/18 1824   05/03/18 1400  ceFEPIme (MAXIPIME) 2 g in sodium chloride 0.9 % 100 mL IVPB  Status:  Discontinued     2 g 200 mL/hr over 30 Minutes Intravenous Every 24 hours 05/02/18 1526 05/03/18 1026   05/03/18 0330  vancomycin (VANCOCIN) IVPB 750 mg/150 ml premix  Status:  Discontinued     750 mg 150 mL/hr over 60 Minutes Intravenous Every 12 hours 05/02/18 1526 05/09/18 1205   05/02/18 1500  vancomycin (VANCOCIN) IVPB 1000 mg/200 mL premix     1,000 mg 200 mL/hr over 60 Minutes Intravenous NOW 05/02/18 1448 05/02/18 1620   05/02/18 1400  vancomycin (VANCOCIN) IVPB 1000 mg/200 mL premix     1,000 mg 200 mL/hr over 60 Minutes Intravenous  Once 05/02/18 1354 05/02/18 1520   05/02/18 1400  ceFEPIme (MAXIPIME) 2 g in sodium chloride 0.9 % 100 mL IVPB     2 g 200 mL/hr over 30 Minutes Intravenous  Once 05/02/18 1354 05/02/18 1445       Objective: Vitals:   05/23/18 1645 05/23/18 2034 05/24/18 0428 05/24/18 0929  BP: 125/75 118/78 130/89 126/73  Pulse: 81 88 85 84  Resp: 18 18 (!) 22 18  Temp: 97.9 F (36.6 C) 98.4 F (36.9 C) (!) 97.4 F (36.3 C) 98.1 F (36.7 C)  TempSrc: Oral Oral Oral Oral  SpO2: 97%  92% 94%  Weight:  90.4 kg    Height:        Intake/Output Summary (Last 24 hours) at 05/24/2018 1129 Last data filed at 05/24/2018 1000 Gross per 24 hour  Intake 490.33 ml  Output 850 ml  Net -359.67 ml   Filed Weights   05/19/18  2038 05/21/18 2123 05/23/18 2034  Weight: 91 kg 90.9 kg 90.4 kg    Examination: Examination: General exam: Appears calm and comfortable  Respiratory system: Clear to auscultation. Respiratory effort normal. Cardiovascular system: S1 & S2 heard, RRR. No JVD, murmurs, rubs, gallops or clicks. No pedal edema. Gastrointestinal system: Abdomen is nondistended, soft and nontender. No organomegaly or masses felt. Normal bowel sounds heard. Central nervous system: Alert and oriented.  Extremities: Symmetric  Psychiatry: Judgement and insight appear normal. Mood & affect appropriate.     Data Reviewed: I have personally reviewed following labs and imaging studies  CBC: Recent Labs  Lab 05/20/18 1033 05/22/18 0737 05/23/18 0544  WBC 7.4 7.2 7.6  NEUTROABS 5.5  --   --   HGB 7.5* 7.2* 7.3*  HCT 24.1* 23.3* 23.9*  MCV 86.4 86.6 87.5  PLT 539* 517* 376*   Basic Metabolic Panel: Recent Labs  Lab 05/18/18 0539 05/20/18 1033 05/22/18 0737 05/23/18 0544  NA 138 137 139 139  K 3.7 3.5 3.0* 3.7  CL 102 103 104 106  CO2 25 25 24 23   GLUCOSE  104* 97 91 94  BUN 21 20 18 17   CREATININE 2.16* 1.94* 1.73* 1.59*  CALCIUM 8.5* 8.6* 8.5* 8.5*   GFR: Estimated Creatinine Clearance: 49.4 mL/min (A) (by C-G formula based on SCr of 1.59 mg/dL (H)). Liver Function Tests: No results for input(s): AST, ALT, ALKPHOS, BILITOT, PROT, ALBUMIN in the last 168 hours. No results for input(s): LIPASE, AMYLASE in the last 168 hours. No results for input(s): AMMONIA in the last 168 hours. Coagulation Profile: No results for input(s): INR, PROTIME in the last 168 hours. Cardiac Enzymes: Recent Labs  Lab 05/18/18 0539 05/24/18 0646  CKTOTAL 30* 41*   BNP (last 3 results) No results for input(s): PROBNP in the last 8760 hours. HbA1C: No results for input(s): HGBA1C in the last 72 hours. CBG: No results for input(s): GLUCAP in the last 168 hours. Lipid Profile: No results for input(s): CHOL, HDL,  LDLCALC, TRIG, CHOLHDL, LDLDIRECT in the last 72 hours. Thyroid Function Tests: No results for input(s): TSH, T4TOTAL, FREET4, T3FREE, THYROIDAB in the last 72 hours. Anemia Panel: No results for input(s): VITAMINB12, FOLATE, FERRITIN, TIBC, IRON, RETICCTPCT in the last 72 hours. Sepsis Labs: No results for input(s): PROCALCITON, LATICACIDVEN in the last 168 hours.  No results found for this or any previous visit (from the past 240 hour(s)).     Radiology Studies: No results found.    Scheduled Meds: . amLODipine  10 mg Oral Daily  . apixaban  5 mg Oral BID  . carvedilol  12.5 mg Oral BID WC  . collagenase   Topical Daily  . ferrous sulfate  325 mg Oral BID WC  . liver oil-zinc oxide   Topical QID   Continuous Infusions: . sodium chloride 500 mL (05/23/18 1141)  . DAPTOmycin (CUBICIN)  IV 730 mg (05/23/18 1903)     LOS: 22 days    Time spent: 20 minutes   Dessa Phi, DO [facility] [email or web address] Password [email or web address] 05/24/2018, 11:29 AM

## 2018-05-25 LAB — CBC
HEMATOCRIT: 21.9 % — AB (ref 39.0–52.0)
Hemoglobin: 6.8 g/dL — CL (ref 13.0–17.0)
MCH: 27.1 pg (ref 26.0–34.0)
MCHC: 31.1 g/dL (ref 30.0–36.0)
MCV: 87.3 fL (ref 80.0–100.0)
Platelets: 579 10*3/uL — ABNORMAL HIGH (ref 150–400)
RBC: 2.51 MIL/uL — ABNORMAL LOW (ref 4.22–5.81)
RDW: 14.8 % (ref 11.5–15.5)
WBC: 8 10*3/uL (ref 4.0–10.5)
nRBC: 0 % (ref 0.0–0.2)

## 2018-05-25 LAB — BASIC METABOLIC PANEL
Anion gap: 10 (ref 5–15)
BUN: 13 mg/dL (ref 8–23)
CO2: 24 mmol/L (ref 22–32)
Calcium: 8.4 mg/dL — ABNORMAL LOW (ref 8.9–10.3)
Chloride: 105 mmol/L (ref 98–111)
Creatinine, Ser: 1.54 mg/dL — ABNORMAL HIGH (ref 0.61–1.24)
GFR calc Af Amer: 53 mL/min — ABNORMAL LOW (ref >60)
GFR calc non Af Amer: 46 mL/min — ABNORMAL LOW (ref >60)
GLUCOSE: 95 mg/dL (ref 70–99)
Potassium: 3.3 mmol/L — ABNORMAL LOW (ref 3.5–5.1)
Sodium: 139 mmol/L (ref 135–145)

## 2018-05-25 LAB — ABO/RH: ABO/RH(D): O POS

## 2018-05-25 LAB — PREPARE RBC (CROSSMATCH)

## 2018-05-25 MED ORDER — SODIUM CHLORIDE 0.9% IV SOLUTION: Freq: Once | INTRAVENOUS | Status: DC

## 2018-05-25 MED ORDER — POTASSIUM CHLORIDE CRYS ER 20 MEQ PO TBCR
40.0000 meq | EXTENDED_RELEASE_TABLET | Freq: Once | ORAL | Status: AC
  Administered 2018-05-25: 40 meq via ORAL
  Filled 2018-05-25: qty 2

## 2018-05-25 MED ORDER — SODIUM CHLORIDE 0.9% FLUSH
10.0000 mL | INTRAVENOUS | Status: DC | PRN
  Administered 2018-05-28: 20 mL
  Administered 2018-05-29: 10 mL
  Filled 2018-05-25 (×2): qty 40

## 2018-05-25 NOTE — Progress Notes (Signed)
PROGRESS NOTE    Wayne Buckley  OJJ:[id number] DOB: 1951-06-09 DOA: 05/02/2018 PCP: [name], MD     Brief Narrative:  Wayne Buckley is a 67 yo male with past medical history of traumatic brain injury, central cord syndrome, neurogenic bladder with indwelling Foley catheter, bilateral leg weakness, previous history of DVT, hypertension, alcohol abuse.  He presented to the hospital secondary to fever.  He was found to have MRSA bacteremia as well as CT abdomen concerning for ileus.  He was started on IV daptomycin.  New events last 24 hours / Subjective: Feeling tired, nothing else new   Assessment & Plan:   Principal Problem:   MRSA bacteremia Active Problems:   Alcohol abuse   AKI (acute kidney injury) ([place])   Central cord syndrome (HCC)   Essential hypertension   Sepsis ([place])   Ogilvie's syndrome   Chronic anemia   Hyponatremia   Sacral ulcer (HCC)   History of DVT (deep vein thrombosis)   Traumatic brain injury ([place])   Bacteremia   Ileus (HCC)   Abdominal distension   Sepsis secondary to UTI and MRSA bacteremia -Patient met criteria for sepsis at the time of admission, source is secondary to UTI and MRSA bacteremia -Repeat blood cultures 11/15 showed no growth -2D echo showed no evidence of vegetations -ID consulted,initially was placed on IV vancomycin, now changed to daptomycindue to rising creatinine.  -TEE was unsuccessful due to ?obstruction, esophagogram showed no stricture -Tunneled midline placed, continue daptomycin renally dose till December 13 -Discussed with infectious disease on-call 12/5, due to his renal dysfunction, daptomycin is best option for patient.  AKI on CKD 4, improving -Creatinine is trending down -Monitor   Iron deficiency anemia/symptomatic anemia -Continue ferrous sulfate 325 mg twice daily -Monitor CBC, continues to trend downward, no overt bleeding on Eliquis, will transfuse 2u pRBC and monitor   Ileus/abdominal  distention, improving -Patient has been followed by general surgery and gastroenterology, differentials including Ogilvie's syndrome, mechanical component -Esophagogram results discussed with GI, Dr. [name]. Per GI, patient will not be able to tolerate EGD with multiple comorbidities currently and no obvious stricture on the esophagram. -Regarding ileus,GIrecommended enemas, soft or solid diet, increase mobility, sit upright. Patient is not a candidate for neostigmine or colonoscopy decompression. Per GI, this is commonly seen in patients with TBI and cord compression and will spontaneously resolve. -Continue soft solids. Patient will need good bowel regimen with enema scheduled or every other day for constipation.  Urinary retention with neurogenic bladder -Per admission history patient with indwelling Foley catheter secondary to neurogenic bladder, TBI and cord compression -Follow outpatient with urology and outpatient voiding trial, patient was supposed to have appointment on 11/20. Foley catheter changed.  History of DVT, left lower extremity, diagnosed in 02/2018 -Continue Eliquis  History of TBI -Continue care at skilled nursing facility  Essential hypertension -BP stable, continue amlodipine and Coreg  History of alcohol use -Currently stable, no acute issues  Pressure injury -POA, bilateral buttocks, deep tissue injury -Wound care per nursing  Thrombocytosis -Likely reactive from sepsis, bacteremia, platelet count improving    DVT prophylaxis: Eliquis  Code Status: Full Family Communication: No family at bedside, spoke with wife over the phone  Disposition Plan: SNF once adequately treated with IV daptomycin   Antimicrobials:  Anti-infectives (From admission, onward)   Start     Dose/Rate Route Frequency Ordered Stop   05/16/18 0800  DAPTOmycin (CUBICIN) 730 mg in sodium chloride 0.9 % IVPB  730 mg 229.2 mL/hr over 30 Minutes Intravenous Every 24  hours 05/15/18 1029 05/31/18 1759   05/16/18 0000  daptomycin (CUBICIN) IVPB     730 mg Intravenous Every 24 hours 05/15/18 1050 06/01/18 2359   05/14/18 0000  daptomycin (CUBICIN) IVPB  Status:  Discontinued     730 mg Intravenous Every 48 hours 05/14/18 1007 05/15/18    05/10/18 2000  DAPTOmycin (CUBICIN) 730 mg in sodium chloride 0.9 % IVPB  Status:  Discontinued     730 mg 229.2 mL/hr over 30 Minutes Intravenous Every 48 hours 05/10/18 1414 05/15/18 1029   05/09/18 1205  vancomycin variable dose per unstable renal function (pharmacist dosing)  Status:  Discontinued      Does not apply See admin instructions 05/09/18 1205 05/09/18 1824   05/03/18 1400  ceFEPIme (MAXIPIME) 2 g in sodium chloride 0.9 % 100 mL IVPB  Status:  Discontinued     2 g 200 mL/hr over 30 Minutes Intravenous Every 24 hours 05/02/18 1526 05/03/18 1026   05/03/18 0330  vancomycin (VANCOCIN) IVPB 750 mg/150 ml premix  Status:  Discontinued     750 mg 150 mL/hr over 60 Minutes Intravenous Every 12 hours 05/02/18 1526 05/09/18 1205   05/02/18 1500  vancomycin (VANCOCIN) IVPB 1000 mg/200 mL premix     1,000 mg 200 mL/hr over 60 Minutes Intravenous NOW 05/02/18 1448 05/02/18 1620   05/02/18 1400  vancomycin (VANCOCIN) IVPB 1000 mg/200 mL premix     1,000 mg 200 mL/hr over 60 Minutes Intravenous  Once 05/02/18 1354 05/02/18 1520   05/02/18 1400  ceFEPIme (MAXIPIME) 2 g in sodium chloride 0.9 % 100 mL IVPB     2 g 200 mL/hr over 30 Minutes Intravenous  Once 05/02/18 1354 05/02/18 1445       Objective: Vitals:   05/25/18 0942 05/25/18 1211 05/25/18 1216 05/25/18 1246  BP: 123/78  116/78 122/74  Pulse: 81  75 75  Resp: 17 17 17 15   Temp: (!) 97.4 F (36.3 C)  98.4 F (36.9 C) 98 F (36.7 C)  TempSrc: Oral  Oral Oral  SpO2: 98%  96% 97%  Weight:      Height:        Intake/Output Summary (Last 24 hours) at 05/25/2018 1427 Last data filed at 05/25/2018 1231 Gross per 24 hour  Intake 1454.11 ml  Output 1800 ml   Net -345.89 ml   Filed Weights   05/19/18 2038 05/21/18 2123 05/23/18 2034  Weight: 91 kg 90.9 kg 90.4 kg    Examination: General exam: Appears calm and comfortable  Respiratory system: Clear to auscultation. Respiratory effort normal. Cardiovascular system: S1 & S2 heard, RRR. No JVD, murmurs, rubs, gallops or clicks. No pedal edema. Gastrointestinal system: Abdomen is nondistended, soft and nontender. No organomegaly or masses felt. Normal bowel sounds heard. Central nervous system: Alert and oriented. Extremities: Symmetric Skin: No rashes, lesions or ulcers on exposed skin    Data Reviewed: I have personally reviewed following labs and imaging studies  CBC: Recent Labs  Lab 05/20/18 1033 05/22/18 0737 05/23/18 0544 05/25/18 0413  WBC 7.4 7.2 7.6 8.0  NEUTROABS 5.5  --   --   --   HGB 7.5* 7.2* 7.3* 6.8*  HCT 24.1* 23.3* 23.9* 21.9*  MCV 86.4 86.6 87.5 87.3  PLT 539* 517* 551* 009*   Basic Metabolic Panel: Recent Labs  Lab 05/20/18 1033 05/22/18 0737 05/23/18 0544 05/25/18 0413  NA 137 139 139 139  K 3.5  3.0* 3.7 3.3*  CL 103 104 106 105  CO2 25 24 23 24   GLUCOSE 97 91 94 95  BUN 20 18 17 13   CREATININE 1.94* 1.73* 1.59* 1.54*  CALCIUM 8.6* 8.5* 8.5* 8.4*   GFR: Estimated Creatinine Clearance: 51 mL/min (A) (by C-G formula based on SCr of 1.54 mg/dL (H)). Liver Function Tests: No results for input(s): AST, ALT, ALKPHOS, BILITOT, PROT, ALBUMIN in the last 168 hours. No results for input(s): LIPASE, AMYLASE in the last 168 hours. No results for input(s): AMMONIA in the last 168 hours. Coagulation Profile: No results for input(s): INR, PROTIME in the last 168 hours. Cardiac Enzymes: Recent Labs  Lab 05/24/18 0646  CKTOTAL 41*   BNP (last 3 results) No results for input(s): PROBNP in the last 8760 hours. HbA1C: No results for input(s): HGBA1C in the last 72 hours. CBG: No results for input(s): GLUCAP in the last 168 hours. Lipid Profile: No  results for input(s): CHOL, HDL, LDLCALC, TRIG, CHOLHDL, LDLDIRECT in the last 72 hours. Thyroid Function Tests: No results for input(s): TSH, T4TOTAL, FREET4, T3FREE, THYROIDAB in the last 72 hours. Anemia Panel: No results for input(s): VITAMINB12, FOLATE, FERRITIN, TIBC, IRON, RETICCTPCT in the last 72 hours. Sepsis Labs: No results for input(s): PROCALCITON, LATICACIDVEN in the last 168 hours.  No results found for this or any previous visit (from the past 240 hour(s)).     Radiology Studies: No results found.    Scheduled Meds: . sodium chloride   Intravenous Once  . amLODipine  10 mg Oral Daily  . apixaban  5 mg Oral BID  . carvedilol  12.5 mg Oral BID WC  . collagenase   Topical Daily  . ferrous sulfate  325 mg Oral BID WC  . liver oil-zinc oxide   Topical QID   Continuous Infusions: . sodium chloride 500 mL (05/23/18 1141)  . DAPTOmycin (CUBICIN)  IV 730 mg (05/24/18 1701)     LOS: 23 days    Time spent: 20 minutes   Dessa Phi, DO [facility] [email or web address] Password [email or web address] 05/25/2018, 2:27 PM

## 2018-05-25 NOTE — Progress Notes (Signed)
CRITICAL VALUE ALERT  Critical Value:  Hemoglobin 6.8  Date & Time Notied:  05/25/18 0441  Provider Notified: [place],NP 05/25/18 @ 0458  Orders Received/Actions taken: Yes

## 2018-05-26 LAB — BPAM RBC
Blood Product Expiration Date: 201912302359
Blood Product Expiration Date: 201912302359
ISSUE DATE / TIME: 201912070917
ISSUE DATE / TIME: 201912071223
Unit Type and Rh: 5100
Unit Type and Rh: 5100

## 2018-05-26 LAB — BASIC METABOLIC PANEL
ANION GAP: 5 (ref 5–15)
BUN: 9 mg/dL (ref 8–23)
CALCIUM: 8 mg/dL — AB (ref 8.9–10.3)
CO2: 23 mmol/L (ref 22–32)
Chloride: 106 mmol/L (ref 98–111)
Creatinine, Ser: 1.24 mg/dL (ref 0.61–1.24)
GFR calc Af Amer: >60 mL/min (ref >60)
GFR calc non Af Amer: 59 mL/min — ABNORMAL LOW (ref >60)
Glucose, Bld: 85 mg/dL (ref 70–99)
Potassium: 3.3 mmol/L — ABNORMAL LOW (ref 3.5–5.1)
Sodium: 134 mmol/L — ABNORMAL LOW (ref 135–145)

## 2018-05-26 LAB — TYPE AND SCREEN
ABO/RH(D): O POS
Antibody Screen: NEGATIVE
Unit division: 0
Unit division: 0

## 2018-05-26 LAB — CBC
HCT: 28.4 % — ABNORMAL LOW (ref 39.0–52.0)
Hemoglobin: 8.9 g/dL — ABNORMAL LOW (ref 13.0–17.0)
MCH: 27.3 pg (ref 26.0–34.0)
MCHC: 31.3 g/dL (ref 30.0–36.0)
MCV: 87.1 fL (ref 80.0–100.0)
Platelets: 596 10*3/uL — ABNORMAL HIGH (ref 150–400)
RBC: 3.26 MIL/uL — AB (ref 4.22–5.81)
RDW: 14.4 % (ref 11.5–15.5)
WBC: 7.8 10*3/uL (ref 4.0–10.5)
nRBC: 0 % (ref 0.0–0.2)

## 2018-05-26 MED ORDER — POTASSIUM CHLORIDE CRYS ER 20 MEQ PO TBCR
40.0000 meq | EXTENDED_RELEASE_TABLET | Freq: Once | ORAL | Status: AC
  Administered 2018-05-26: 40 meq via ORAL
  Filled 2018-05-26: qty 2

## 2018-05-26 NOTE — Progress Notes (Addendum)
PROGRESS NOTE    DENSIL OTTEY  AJO:[id number] DOB: 01/01/1951 DOA: 05/02/2018 PCP: [name], MD     Brief Narrative:  Wayne Buckley is a 67 yo male with past medical history of traumatic brain injury, central cord syndrome, neurogenic bladder with indwelling Foley catheter, bilateral leg weakness, previous history of DVT, hypertension, alcohol abuse.  He presented to the hospital secondary to fever.  He was found to have MRSA bacteremia as well as CT abdomen concerning for ileus.  He was started on IV daptomycin.  New events last 24 hours / Subjective: No new events overnight.  States that he feels better after blood transfusion, wants to go back to sleep.  Denies any chest pain, shortness of breath, nausea, vomiting.  Assessment & Plan:   Principal Problem:   MRSA bacteremia Active Problems:   Alcohol abuse   AKI (acute kidney injury) ([place])   Central cord syndrome (HCC)   Essential hypertension   Sepsis ([place])   Ogilvie's syndrome   Chronic anemia   Hyponatremia   Sacral ulcer (HCC)   History of DVT (deep vein thrombosis)   Traumatic brain injury ([place])   Bacteremia   Ileus (HCC)   Abdominal distension   Sepsis secondary to UTI and MRSA bacteremia -Patient met criteria for sepsis at the time of admission, source is secondary to UTI and MRSA bacteremia -Repeat blood cultures 11/15 showed no growth -2D echo showed no evidence of vegetations -ID consulted,initially was placed on IV vancomycin, now changed to daptomycindue to rising creatinine.  -TEE was unsuccessful due to ?obstruction, esophagogram showed no stricture -Tunneled midline placed, continue daptomycin renally dose till December 13 -Discussed with infectious disease on-call 12/5, due to his renal dysfunction, daptomycin is best option for patient.  AKI on CKD 4, improving -Creatinine is trending down -Monitor, pending lab work this morning  Iron deficiency anemia/symptomatic anemia -Continue  ferrous sulfate 325 mg twice daily -Transfuse 2 units packed red blood cells on 12/7.  No sign of overt bleeding noted.  Ileus/abdominal distention, improving -Patient has been followed by general surgery and gastroenterology, differentials including Ogilvie's syndrome, mechanical component -Esophagogram results discussed with GI, Dr. [name]. Per GI, patient will not be able to tolerate EGD with multiple comorbidities currently and no obvious stricture on the esophagram. -Regarding ileus,GIrecommended enemas, soft or solid diet, increase mobility, sit upright. Patient is not a candidate for neostigmine or colonoscopy decompression. Per GI, this is commonly seen in patients with TBI and cord compression and will spontaneously resolve. -Continue soft solids. Patient will need good bowel regimen with enema scheduled or every other day for constipation.  Urinary retention with neurogenic bladder -Per admission history patient with indwelling Foley catheter secondary to neurogenic bladder, TBI and cord compression -Follow outpatient with urology and outpatient voiding trial, patient was supposed to have appointment on 11/20. Foley catheter changed.  History of DVT, left lower extremity, diagnosed in 02/2018 -Continue Eliquis  History of TBI -Continue care at skilled nursing facility  Essential hypertension -BP stable, continue amlodipine and Coreg  History of alcohol use -Currently stable, no acute issues  Pressure injury -POA, bilateral buttocks, deep tissue injury -Wound care per nursing  Thrombocytosis -Likely reactive from sepsis, bacteremia, platelet count improving    DVT prophylaxis: Eliquis  Code Status: Full Family Communication: No family at bedside Disposition Plan: SNF once adequately treated with IV daptomycin, last day 12/13   Antimicrobials:  Anti-infectives (From admission, onward)   Start     Dose/Rate  Route Frequency Ordered Stop   05/16/18 0800   DAPTOmycin (CUBICIN) 730 mg in sodium chloride 0.9 % IVPB     730 mg 229.2 mL/hr over 30 Minutes Intravenous Every 24 hours 05/15/18 1029 05/31/18 1759   05/16/18 0000  daptomycin (CUBICIN) IVPB     730 mg Intravenous Every 24 hours 05/15/18 1050 06/01/18 2359   05/14/18 0000  daptomycin (CUBICIN) IVPB  Status:  Discontinued     730 mg Intravenous Every 48 hours 05/14/18 1007 05/15/18    05/10/18 2000  DAPTOmycin (CUBICIN) 730 mg in sodium chloride 0.9 % IVPB  Status:  Discontinued     730 mg 229.2 mL/hr over 30 Minutes Intravenous Every 48 hours 05/10/18 1414 05/15/18 1029   05/09/18 1205  vancomycin variable dose per unstable renal function (pharmacist dosing)  Status:  Discontinued      Does not apply See admin instructions 05/09/18 1205 05/09/18 1824   05/03/18 1400  ceFEPIme (MAXIPIME) 2 g in sodium chloride 0.9 % 100 mL IVPB  Status:  Discontinued     2 g 200 mL/hr over 30 Minutes Intravenous Every 24 hours 05/02/18 1526 05/03/18 1026   05/03/18 0330  vancomycin (VANCOCIN) IVPB 750 mg/150 ml premix  Status:  Discontinued     750 mg 150 mL/hr over 60 Minutes Intravenous Every 12 hours 05/02/18 1526 05/09/18 1205   05/02/18 1500  vancomycin (VANCOCIN) IVPB 1000 mg/200 mL premix     1,000 mg 200 mL/hr over 60 Minutes Intravenous NOW 05/02/18 1448 05/02/18 1620   05/02/18 1400  vancomycin (VANCOCIN) IVPB 1000 mg/200 mL premix     1,000 mg 200 mL/hr over 60 Minutes Intravenous  Once 05/02/18 1354 05/02/18 1520   05/02/18 1400  ceFEPIme (MAXIPIME) 2 g in sodium chloride 0.9 % 100 mL IVPB     2 g 200 mL/hr over 30 Minutes Intravenous  Once 05/02/18 1354 05/02/18 1445       Objective: Vitals:   05/25/18 1605 05/25/18 2125 05/25/18 2224 05/26/18 0443  BP: (!) 126/94 (!) 142/95 (!) 136/92 137/81  Pulse: 86 81 86 84  Resp: 17  18 18   Temp: 98.4 F (36.9 C) 99.3 F (37.4 C) 99.1 F (37.3 C) 98 F (36.7 C)  TempSrc: Oral Oral Oral   SpO2: 97% (!) 88% 96% 98%  Weight:  90.5 kg      Height:        Intake/Output Summary (Last 24 hours) at 05/26/2018 1226 Last data filed at 05/26/2018 0805 Gross per 24 hour  Intake 1098.34 ml  Output 2200 ml  Net -1101.66 ml   Filed Weights   05/21/18 2123 05/23/18 2034 05/25/18 2125  Weight: 90.9 kg 90.4 kg 90.5 kg    Examination: General exam: Appears calm and comfortable  Respiratory system: Clear to auscultation. Respiratory effort normal. Cardiovascular system: S1 & S2 heard, RRR. No JVD, murmurs, rubs, gallops or clicks. No pedal edema. Gastrointestinal system: Abdomen is nondistended, soft and nontender. No organomegaly or masses felt. Normal bowel sounds heard. Central nervous system: Alert and oriented.  Extremities: Symmetric Skin: No rashes, lesions or ulcers   Data Reviewed: I have personally reviewed following labs and imaging studies  CBC: Recent Labs  Lab 05/20/18 1033 05/22/18 0737 05/23/18 0544 05/25/18 0413 05/26/18 0346  WBC 7.4 7.2 7.6 8.0 7.8  NEUTROABS 5.5  --   --   --   --   HGB 7.5* 7.2* 7.3* 6.8* 8.9*  HCT 24.1* 23.3* 23.9* 21.9* 28.4*  MCV  86.4 86.6 87.5 87.3 87.1  PLT 539* 517* 551* 579* 270*   Basic Metabolic Panel: Recent Labs  Lab 05/20/18 1033 05/22/18 0737 05/23/18 0544 05/25/18 0413  NA 137 139 139 139  K 3.5 3.0* 3.7 3.3*  CL 103 104 106 105  CO2 25 24 23 24   GLUCOSE 97 91 94 95  BUN 20 18 17 13   CREATININE 1.94* 1.73* 1.59* 1.54*  CALCIUM 8.6* 8.5* 8.5* 8.4*   GFR: Estimated Creatinine Clearance: 51 mL/min (A) (by C-G formula based on SCr of 1.54 mg/dL (H)). Liver Function Tests: No results for input(s): AST, ALT, ALKPHOS, BILITOT, PROT, ALBUMIN in the last 168 hours. No results for input(s): LIPASE, AMYLASE in the last 168 hours. No results for input(s): AMMONIA in the last 168 hours. Coagulation Profile: No results for input(s): INR, PROTIME in the last 168 hours. Cardiac Enzymes: Recent Labs  Lab 05/24/18 0646  CKTOTAL 41*   BNP (last 3 results) No  results for input(s): PROBNP in the last 8760 hours. HbA1C: No results for input(s): HGBA1C in the last 72 hours. CBG: No results for input(s): GLUCAP in the last 168 hours. Lipid Profile: No results for input(s): CHOL, HDL, LDLCALC, TRIG, CHOLHDL, LDLDIRECT in the last 72 hours. Thyroid Function Tests: No results for input(s): TSH, T4TOTAL, FREET4, T3FREE, THYROIDAB in the last 72 hours. Anemia Panel: No results for input(s): VITAMINB12, FOLATE, FERRITIN, TIBC, IRON, RETICCTPCT in the last 72 hours. Sepsis Labs: No results for input(s): PROCALCITON, LATICACIDVEN in the last 168 hours.  No results found for this or any previous visit (from the past 240 hour(s)).     Radiology Studies: No results found.    Scheduled Meds: . sodium chloride   Intravenous Once  . amLODipine  10 mg Oral Daily  . apixaban  5 mg Oral BID  . carvedilol  12.5 mg Oral BID WC  . collagenase   Topical Daily  . ferrous sulfate  325 mg Oral BID WC  . liver oil-zinc oxide   Topical QID   Continuous Infusions: . sodium chloride 500 mL (05/25/18 2334)  . DAPTOmycin (CUBICIN)  IV 730 mg (05/25/18 1718)     LOS: 24 days    Time spent: 20 minutes   Dessa Phi, DO [facility] [email or web address] Password [facility] 05/26/2018, 12:26 PM

## 2018-05-27 LAB — CBC
HEMATOCRIT: 29.3 % — AB (ref 39.0–52.0)
Hemoglobin: 9.2 g/dL — ABNORMAL LOW (ref 13.0–17.0)
MCH: 27.5 pg (ref 26.0–34.0)
MCHC: 31.4 g/dL (ref 30.0–36.0)
MCV: 87.7 fL (ref 80.0–100.0)
Platelets: 634 10*3/uL — ABNORMAL HIGH (ref 150–400)
RBC: 3.34 MIL/uL — ABNORMAL LOW (ref 4.22–5.81)
RDW: 14.6 % (ref 11.5–15.5)
WBC: 8.9 10*3/uL (ref 4.0–10.5)
nRBC: 0 % (ref 0.0–0.2)

## 2018-05-27 LAB — BASIC METABOLIC PANEL
Anion gap: 11 (ref 5–15)
BUN: 10 mg/dL (ref 8–23)
CHLORIDE: 104 mmol/L (ref 98–111)
CO2: 24 mmol/L (ref 22–32)
Calcium: 8.5 mg/dL — ABNORMAL LOW (ref 8.9–10.3)
Creatinine, Ser: 1.4 mg/dL — ABNORMAL HIGH (ref 0.61–1.24)
GFR calc Af Amer: 59 mL/min — ABNORMAL LOW (ref >60)
GFR calc non Af Amer: 51 mL/min — ABNORMAL LOW (ref >60)
Glucose, Bld: 99 mg/dL (ref 70–99)
POTASSIUM: 3.7 mmol/L (ref 3.5–5.1)
Sodium: 139 mmol/L (ref 135–145)

## 2018-05-27 LAB — CK: Total CK: 49 U/L (ref 49–397)

## 2018-05-27 NOTE — Progress Notes (Signed)
Physical Therapy Treatment Patient Details Name: Wayne HunJames M Buckley MRN: [id number] DOB: 02/25/1951 Today's Date: 05/27/2018    History of Present Illness Pt is a 67 y/o male with a PMH signficant for TBI, central cord syndrome, neurogenic bladder with indwelling foley catheter, BLE weakness, prior DVT, HTN, alcohol abuse presenting to the hospital for evaluation of fever. In ED, urine culture grew MRSA and CT abdomen revealed possible large bowel dysmotility or ileus.     PT Comments    Continuing work on functional mobility and activity tolerance;  Session focused on trunk stability and postural muscle endurance in unsupported sitting while encouraging participation in ADLs/grooming tasks at sink; Noting improved participation in bed mobility, and getting up to EOB, though still requiring extensive assist; spent more time in unsupported sitting this session with level of assist fluctuating between min assist and Total assist to remain with trunk upright  Follow Up Recommendations  SNF;Supervision/Assistance - 24 hour     Equipment Recommendations  None recommended by PT    Recommendations for Other Services       Precautions / Restrictions Precautions Precautions: Fall Precaution Comments: indwelling suprapubic catheter    Mobility  Bed Mobility Overal bed mobility: Needs Assistance Bed Mobility: Rolling;Sidelying to Sit Rolling: Mod assist Sidelying to sit: +2 for physical assistance;HOB elevated;Max assist       General bed mobility comments: Pt was able to reach for bed rails with hand-over-hand to initiate, and good effort to pull himself towards rail. Even with his effort however, therapists still providing max assist to complete roll fully to the side and transition to EOB.   Transfers Overall transfer level: Needs assistance   Transfers: Sit to/from Stand Sit to Stand: +2 physical assistance;Max assist         General transfer comment: VC's for sequencing and safety.  Pt required +2 max assist for power-up to full stand from elevated surface. Increased effort to place hands on center bar of Stedy and elevate trunk up so he was not also leaning on the bar. With second stand in Stedy to get to chair, pt required lots of encouragement and multimodal cues to keep head and chest up  Ambulation/Gait                 Stairs             Wheelchair Mobility    Modified Rankin (Stroke Patients Only)       Balance Overall balance assessment: Needs assistance Sitting-balance support: Bilateral upper extremity supported;Feet supported(and knees blocked in STedy) Sitting balance-[name] Scale: Poor Sitting balance - Comments: Sat in stedy (with knees blocked) at sink for grooming tasks; Pt has a baseline of almost choreoathetoid trunk movements; he fatigues quickly, and level of assist in trunk unsupported sitting varied between min/minguard assist to max/total assist to stay upright; at times, pt pulling forward to rest forearms on sink/stedy bar Postural control: Other (comment)(trunk leaning fluctuating in all directions) Standing balance support: Bilateral upper extremity supported;During functional activity Standing balance-[name] Scale: Zero Standing balance comment: +2 assist required                            Cognition Arousal/Alertness: Awake/alert Behavior During Therapy: Restless;Impulsive Overall Cognitive Status: History of cognitive impairments - at baseline  General Comments: Wife present during session and a big motivator for pt. Would recommend wife be present for future therapy sessions      Exercises      General Comments        Pertinent Vitals/Pain Pain Assessment: Faces Faces Pain Scale: Hurts a little bit Pain Location: General grimacing with movement and ADLs; likely also related to effort Pain Descriptors / Indicators: Grimacing;Moaning Pain Intervention(s):  Monitored during session    Home Living                      Prior Function            PT Goals (current goals can now be found in the care plan section) Acute Rehab PT Goals Patient Stated Goal: Wife's goal is for pt to get OOB to chair daily.  PT Goal Formulation: With family Time For Goal Achievement: 05/09/19(Goals set 11/15 continue to be appropriate) Potential to Achieve Goals: Good Progress towards PT goals: Progressing toward goals    Frequency    Min 2X/week      PT Plan Current plan remains appropriate    Co-evaluation PT/OT/SLP Co-Evaluation/Treatment: Yes Reason for Co-Treatment: Complexity of the patient's impairments (multi-system involvement);Necessary to address cognition/behavior during functional activity;To address functional/ADL transfers;For patient/therapist safety PT goals addressed during session: Mobility/safety with mobility OT goals addressed during session: ADL's and self-care      AM-PAC PT "6 Clicks" Mobility   Outcome Measure  Help needed turning from your back to your side while in a flat bed without using bedrails?: A Lot Help needed moving from lying on your back to sitting on the side of a flat bed without using bedrails?: Total Help needed moving to and from a bed to a chair (including a wheelchair)?: Total Help needed standing up from a chair using your arms (e.g., wheelchair or bedside chair)?: Total Help needed to walk in hospital room?: Total Help needed climbing 3-5 steps with a railing? : Total 6 Click Score: 7    End of Session Equipment Utilized During Treatment: Gait belt Activity Tolerance: Patient tolerated treatment well Patient left: in chair;with call bell/phone within reach;with chair alarm set Nurse Communication: Mobility status;Need for lift equipment;Other (comment)(Please attend to beeping from air mattress) PT Visit Diagnosis: History of falling (Z91.81);Difficulty in walking, not elsewhere classified  (R26.2)     Time: 1610-9604 PT Time Calculation (min) (ACUTE ONLY): 45 min  Charges:  $Therapeutic Activity: 8-22 mins                     Van Clines, PT  Acute Rehabilitation Services Pager [phone] Office [phone]    Wayne Buckley 05/27/2018, 4:23 PM

## 2018-05-27 NOTE — Clinical Social Work Note (Signed)
CSW reviewed MD's progress note and patient remains on Daptomycin, which is an expensive antibiotic. CSW will continue and assist with disposition as needed/approriate.  Genelle BalVanessa Alva Kuenzel, MSW, LCSW Licensed Clinical Social Worker Clinical Social Work Department [facility] [phone]

## 2018-05-27 NOTE — Progress Notes (Signed)
Occupational Therapy Treatment Patient Details Name: Wayne Buckley MRN: [id number] DOB: 12/16/1950 Today's Date: 05/27/2018    History of present illness Pt is a 67 y/o male with a PMH signficant for TBI, central cord syndrome, neurogenic bladder with indwelling foley catheter, BLE weakness, prior DVT, HTN, alcohol abuse presenting to the hospital for evaluation of fever. In ED, urine culture grew MRSA and CT abdomen revealed possible large bowel dysmotility or ileus.    OT comments  Pt progressing towards established OT goals. Continues to require Max cues and encouragement for participation. Pt washing his hair at sink with Max A for maintaining upright posture while seated on stedy and Max A for applying wash cap. Pt requiring Mod A for UB bathing while seated in recliner. Continue to recommend dc to SNF and will continue t follow acutely as admitted. Updated goals.   Follow Up Recommendations  SNF;Supervision/Assistance - 24 hour    Equipment Recommendations  Other (comment)(Defer to next venue)    Recommendations for Other Services      Precautions / Restrictions Precautions Precautions: Fall Precaution Comments: indwelling suprapubic catheter       Mobility Bed Mobility Overal bed mobility: Needs Assistance Bed Mobility: Rolling;Sidelying to Sit Rolling: Mod assist Sidelying to sit: +2 for physical assistance;HOB elevated;Max assist       General bed mobility comments: Pt was able to reach for bed rails with hand-over-hand to initiate, and good effort to pull himself towards rail. Even with his effort however, therapists still providing max assist to complete roll fully to the side and transition to EOB.   Transfers Overall transfer level: Needs assistance   Transfers: Sit to/from Stand Sit to Stand: +2 physical assistance;Max assist         General transfer comment: VC's for sequencing and safety. Pt required +2 max assist for power-up to full stand from elevated  surface. Increased effort to place hands on center bar of Stedy and elevate trunk up so he was not also leaning on the bar. With second stand in Stedy to get to chair, pt required lots of encouragement and multimodal cues to keep head and chest up    Balance Overall balance assessment: Needs assistance Sitting-balance support: Bilateral upper extremity supported;Feet supported(and knees blocked in STedy) Sitting balance-[name] Scale: Poor Sitting balance - Comments: Sat in stedy (with knees blocked) at sink for grooming tasks; Pt has a baseline of almost choreoathetoid trunk movements; he fatigues quickly, and level of assist in trunk unsupported sitting varied between min/minguard assist to max/total assist to stay upright; at times, pt pulling forward to rest forearms on sink/stedy bar Postural control: Other (comment)(trunk leaning fluctuating in all directions) Standing balance support: Bilateral upper extremity supported;During functional activity Standing balance-[name] Scale: Zero Standing balance comment: +2 assist required                           ADL either performed or assessed with clinical judgement   ADL Overall ADL's : Needs assistance/impaired     Grooming: Set up;Supervision/safety;Sitting;Wash/dry face;Maximal assistance Grooming Details (indicate cue type and reason): Washing his face with set up and cues. Max A for washing his hair at sink whiel seated at stedy. Pt requiring Max tactile cues to maintain upright posture.  Upper Body Bathing: Moderate assistance;Sitting Upper Body Bathing Details (indicate cue type and reason): Pt particiapting in UB bathing and requiring Max cues. Mod A for washing axilary area and back.  Upper Body Dressing : Minimal assistance;Sitting Upper Body Dressing Details (indicate cue type and reason): Donned new gown with Min A for bringing over arms.      Toilet Transfer: Maximal assistance;+2 for physical assistance;Moderate  assistance(sit<>stand with stedy; simulated to recliner) Toilet Transfer Details (indicate cue type and reason): Mod A +2 for sit<>Stand. Max A for sit<>Stand at second attempt.          Functional mobility during ADLs: Moderate assistance;Maximal assistance;+2 for physical assistance;+2 for safety/equipment(sit<>stand with stedy) General ADL Comments: Pt participating in washing his hair at sink and UB bathing. Requiring Max cues throughout and Max encouragement.      Vision       Perception     Praxis      Cognition Arousal/Alertness: Awake/alert Behavior During Therapy: Restless;Impulsive Overall Cognitive Status: History of cognitive impairments - at baseline                                 General Comments: Wife present during session and a big motivator for pt. Would recommend wife be present for future therapy sessions        Exercises     Shoulder Instructions       General Comments Wife present throughout    Pertinent Vitals/ Pain       Pain Assessment: Faces Faces Pain Scale: Hurts a little bit Pain Location: General grimacing with movement and ADLs; likely also related to effort Pain Descriptors / Indicators: Grimacing;Moaning Pain Intervention(s): Monitored during session;Limited activity within patient's tolerance;Repositioned  Home Living                                          Prior Functioning/Environment              Frequency  Min 2X/week        Progress Toward Goals  OT Goals(current goals can now be found in the care plan section)  Progress towards OT goals: Progressing toward goals  Acute Rehab OT Goals Patient Stated Goal: Wife's goal is for pt to get OOB to chair daily.  OT Goal Formulation: With patient/family Time For Goal Achievement: 05/20/18 Potential to Achieve Goals: Good ADL Goals Pt Will Perform Grooming: with set-up;with supervision;sitting Pt Will Perform Upper Body Bathing: with  set-up;with supervision;sitting Pt Will Perform Lower Body Bathing: with mod assist Pt Will Transfer to Toilet: with mod assist;with +2 assist;stand pivot transfer;bedside commode Additional ADL Goal #1: Pt will be able to come up to sit EOB with Mod A in prep for ADLs and tranfers OOB  Plan Discharge plan remains appropriate    Co-evaluation    PT/OT/SLP Co-Evaluation/Treatment: Yes Reason for Co-Treatment: Complexity of the patient's impairments (multi-system involvement);For patient/therapist safety;To address functional/ADL transfers PT goals addressed during session: Mobility/safety with mobility OT goals addressed during session: ADL's and self-care      AM-PAC OT "6 Clicks" Daily Activity     Outcome Measure   Help from another person eating meals?: A Little Help from another person taking care of personal grooming?: A Little Help from another person toileting, which includes using toliet, bedpan, or urinal?: A Lot Help from another person bathing (including washing, rinsing, drying)?: A Lot Help from another person to put on and taking off regular upper body clothing?: A Lot Help from another person to  put on and taking off regular lower body clothing?: Total 6 Click Score: 13    End of Session Equipment Utilized During Treatment: Gait belt;Oxygen(stedy)  OT Visit Diagnosis: Unsteadiness on feet (R26.81);Other abnormalities of gait and mobility (R26.89);Muscle weakness (generalized) (M62.81);History of falling (Z91.81);Pain;Ataxia, unspecified (R27.0);Other symptoms and signs involving cognitive function   Activity Tolerance Patient tolerated treatment well   Patient Left in chair;with call bell/phone within reach;with family/visitor present;with chair alarm set   Nurse Communication Mobility status;Need for lift equipment        Time: [id number] OT Time Calculation (min): 42 min  Charges: OT General Charges $OT Visit: 1 Visit OT Treatments $Self Care/Home  Management : 23-37 mins  Ismail Graziani MSOT, OTR/L Acute Rehab Pager: [phone] Office: [phone]   Theodoro GristCharis M Naama [place] 05/27/2018, 5:45 PM

## 2018-05-27 NOTE — Progress Notes (Signed)
PROGRESS NOTE    TARRON KROLAK  AST:[id number] DOB: March 04, 1951 DOA: 05/02/2018 PCP: [name], MD     Brief Narrative:  Wayne Buckley is a 67 yo male with past medical history of traumatic brain injury, central cord syndrome, neurogenic bladder with indwelling Foley catheter, bilateral leg weakness, previous history of DVT, hypertension, alcohol abuse.  He presented to the hospital secondary to fever.  He was found to have MRSA bacteremia as well as CT abdomen concerning for ileus.  He was started on IV daptomycin.  New events last 24 hours / Subjective: No new events, continues to deny any acute or overt blood loss  Assessment & Plan:   Principal Problem:   MRSA bacteremia Active Problems:   Alcohol abuse   AKI (acute kidney injury) ([place])   Central cord syndrome (HCC)   Essential hypertension   Sepsis ([place])   Ogilvie's syndrome   Chronic anemia   Hyponatremia   Sacral ulcer (HCC)   History of DVT (deep vein thrombosis)   Traumatic brain injury ([place])   Bacteremia   Ileus (HCC)   Abdominal distension   Sepsis secondary to UTI and MRSA bacteremia -Patient met criteria for sepsis at the time of admission, source is secondary to UTI and MRSA bacteremia -Repeat blood cultures 11/15 showed no growth -2D echo showed no evidence of vegetations -ID consulted,initially was placed on IV vancomycin, now changed to daptomycindue to rising creatinine.  -TEE was unsuccessful due to ?obstruction, esophagogram showed no stricture -Tunneled midline placed, continue daptomycin renally dose till December 13 -Discussed with infectious disease on-call 12/5, due to his renal dysfunction, daptomycin is best option for patient.  AKI on CKD 4, improving -Creatinine mildly elevated from yesterday, continue to monitor  Iron deficiency anemia/symptomatic anemia -Continue ferrous sulfate 325 mg twice daily -Transfuse 2 units packed red blood cells on 12/7.  No sign of overt bleeding  noted.  Hemoglobin remains stable today  Ileus/abdominal distention, improving -Patient has been followed by general surgery and gastroenterology, differentials including Ogilvie's syndrome, mechanical component -Esophagogram results discussed with GI, Dr. [name]. Per GI, patient will not be able to tolerate EGD with multiple comorbidities currently and no obvious stricture on the esophagram. -Regarding ileus,GIrecommended enemas, soft or solid diet, increase mobility, sit upright. Patient is not a candidate for neostigmine or colonoscopy decompression. Per GI, this is commonly seen in patients with TBI and cord compression and will spontaneously resolve. -Continue soft solids. Patient will need good bowel regimen with enema scheduled or every other day for constipation.  Urinary retention with neurogenic bladder -Per admission history patient with indwelling Foley catheter secondary to neurogenic bladder, TBI and cord compression -Follow outpatient with urology and outpatient voiding trial, patient was supposed to have appointment on 11/20. Foley catheter changed.  History of DVT, left lower extremity, diagnosed in 02/2018 -Continue Eliquis  History of TBI -Continue care at skilled nursing facility  Essential hypertension -BP stable, continue amlodipine and Coreg  History of alcohol use -Currently stable, no acute issues  Pressure injury -POA, bilateral buttocks, deep tissue injury -Wound care per nursing  Thrombocytosis -Likely reactive from sepsis, bacteremia, platelet count improving    DVT prophylaxis: Eliquis  Code Status: Full Family Communication: No family at bedside Disposition Plan: SNF once adequately treated with IV daptomycin, last day 12/13   Antimicrobials:  Anti-infectives (From admission, onward)   Start     Dose/Rate Route Frequency Ordered Stop   05/16/18 0800  DAPTOmycin (CUBICIN) 730 mg in  sodium chloride 0.9 % IVPB     730 mg 229.2  mL/hr over 30 Minutes Intravenous Every 24 hours 05/15/18 1029 05/31/18 1759   05/16/18 0000  daptomycin (CUBICIN) IVPB     730 mg Intravenous Every 24 hours 05/15/18 1050 06/01/18 2359   05/14/18 0000  daptomycin (CUBICIN) IVPB  Status:  Discontinued     730 mg Intravenous Every 48 hours 05/14/18 1007 05/15/18    05/10/18 2000  DAPTOmycin (CUBICIN) 730 mg in sodium chloride 0.9 % IVPB  Status:  Discontinued     730 mg 229.2 mL/hr over 30 Minutes Intravenous Every 48 hours 05/10/18 1414 05/15/18 1029   05/09/18 1205  vancomycin variable dose per unstable renal function (pharmacist dosing)  Status:  Discontinued      Does not apply See admin instructions 05/09/18 1205 05/09/18 1824   05/03/18 1400  ceFEPIme (MAXIPIME) 2 g in sodium chloride 0.9 % 100 mL IVPB  Status:  Discontinued     2 g 200 mL/hr over 30 Minutes Intravenous Every 24 hours 05/02/18 1526 05/03/18 1026   05/03/18 0330  vancomycin (VANCOCIN) IVPB 750 mg/150 ml premix  Status:  Discontinued     750 mg 150 mL/hr over 60 Minutes Intravenous Every 12 hours 05/02/18 1526 05/09/18 1205   05/02/18 1500  vancomycin (VANCOCIN) IVPB 1000 mg/200 mL premix     1,000 mg 200 mL/hr over 60 Minutes Intravenous NOW 05/02/18 1448 05/02/18 1620   05/02/18 1400  vancomycin (VANCOCIN) IVPB 1000 mg/200 mL premix     1,000 mg 200 mL/hr over 60 Minutes Intravenous  Once 05/02/18 1354 05/02/18 1520   05/02/18 1400  ceFEPIme (MAXIPIME) 2 g in sodium chloride 0.9 % 100 mL IVPB     2 g 200 mL/hr over 30 Minutes Intravenous  Once 05/02/18 1354 05/02/18 1445       Objective: Vitals:   05/26/18 2138 05/26/18 2207 05/27/18 0335 05/27/18 0742  BP: (!) 148/108  (!) 144/88 129/80  Pulse: 67  85 81  Resp: _0 Temp: 98.4 F (36.9 C)  98.6 F (37 C) 98.2 F (36.8 C)  TempSrc: Oral  Oral Oral  SpO2: 100%  96% 90%  Weight:  114.8 kg    Height:        Intake/Output Summary (Last 24 hours) at 05/27/2018 1220 Last data filed at 05/27/2018  0600 Gross per 24 hour  Intake 1255 ml  Output 475 ml  Net 780 ml   Filed Weights   05/23/18 2034 05/25/18 2125 05/26/18 2207  Weight: 90.4 kg 90.5 kg 114.8 kg   Examination: General exam: Appears calm and comfortable  Respiratory system: Clear to auscultation. Respiratory effort normal. Cardiovascular system: S1 & S2 heard, RRR. No JVD, murmurs, rubs, gallops or clicks. No pedal edema. Gastrointestinal system: Abdomen is nondistended, soft and nontender. No organomegaly or masses felt. Normal bowel sounds heard. Central nervous system: Alert and oriented Extremities: Symmetric  Skin: No rashes, lesions or ulcers Psychiatry: Judgement and insight appear stable    Data Reviewed: I have personally reviewed following labs and imaging studies  CBC: Recent Labs  Lab 05/22/18 0737 05/23/18 0544 05/25/18 0413 05/26/18 0346 05/27/18 0317  WBC 7.2 7.6 8.0 7.8 8.9  HGB 7.2* 7.3* 6.8* 8.9* 9.2*  HCT 23.3* 23.9* 21.9* 28.4* 29.3*  MCV 86.6 87.5 87.3 87.1 87.7  PLT 517* 551* 579* 596* 353*   Basic Metabolic Panel: Recent Labs  Lab 05/22/18 0737 05/23/18 0544 05/25/18 0413 05/26/18 1245  05/27/18 0317  NA 139 139 139 134* 139  K 3.0* 3.7 3.3* 3.3* 3.7  CL 104 106 105 106 104  CO2 _0 GLUCOSE 91 94 95 85 99  BUN _1 CREATININE 1.73* 1.59* 1.54* 1.24 1.40*  CALCIUM 8.5* 8.5* 8.4* 8.0* 8.5*   GFR: Estimated Creatinine Clearance: 63.1 mL/min (A) (by C-G formula based on SCr of 1.4 mg/dL (H)). Liver Function Tests: No results for input(s): AST, ALT, ALKPHOS, BILITOT, PROT, ALBUMIN in the last 168 hours. No results for input(s): LIPASE, AMYLASE in the last 168 hours. No results for input(s): AMMONIA in the last 168 hours. Coagulation Profile: No results for input(s): INR, PROTIME in the last 168 hours. Cardiac Enzymes: Recent Labs  Lab 05/24/18 0646 05/27/18 0317  CKTOTAL 41* 49   BNP (last 3 results) No results for input(s): PROBNP in the last  8760 hours. HbA1C: No results for input(s): HGBA1C in the last 72 hours. CBG: No results for input(s): GLUCAP in the last 168 hours. Lipid Profile: No results for input(s): CHOL, HDL, LDLCALC, TRIG, CHOLHDL, LDLDIRECT in the last 72 hours. Thyroid Function Tests: No results for input(s): TSH, T4TOTAL, FREET4, T3FREE, THYROIDAB in the last 72 hours. Anemia Panel: No results for input(s): VITAMINB12, FOLATE, FERRITIN, TIBC, IRON, RETICCTPCT in the last 72 hours. Sepsis Labs: No results for input(s): PROCALCITON, LATICACIDVEN in the last 168 hours.  No results found for this or any previous visit (from the past 240 hour(s)).     Radiology Studies: No results found.    Scheduled Meds: . sodium chloride   Intravenous Once  . amLODipine  10 mg Oral Daily  . apixaban  5 mg Oral BID  . carvedilol  12.5 mg Oral BID WC  . collagenase   Topical Daily  . ferrous sulfate  325 mg Oral BID WC  . liver oil-zinc oxide   Topical QID   Continuous Infusions: . sodium chloride 500 mL (05/25/18 2334)  . DAPTOmycin (CUBICIN)  IV 730 mg (05/26/18 1851)     LOS: 25 days    Time spent: 20 minutes   Dessa Phi, DO [facility] [email or web address] Password [email or web address] 05/27/2018, 12:20 PM

## 2018-05-28 LAB — CBC
HCT: 29.5 % — ABNORMAL LOW (ref 39.0–52.0)
Hemoglobin: 8.9 g/dL — ABNORMAL LOW (ref 13.0–17.0)
MCH: 26.7 pg (ref 26.0–34.0)
MCHC: 30.2 g/dL (ref 30.0–36.0)
MCV: 88.6 fL (ref 80.0–100.0)
Platelets: 674 10*3/uL — ABNORMAL HIGH (ref 150–400)
RBC: 3.33 MIL/uL — ABNORMAL LOW (ref 4.22–5.81)
RDW: 14.7 % (ref 11.5–15.5)
WBC: 9.9 10*3/uL (ref 4.0–10.5)
nRBC: 0 % (ref 0.0–0.2)

## 2018-05-28 LAB — BASIC METABOLIC PANEL
Anion gap: 10 (ref 5–15)
BUN: 16 mg/dL (ref 8–23)
CO2: 24 mmol/L (ref 22–32)
Calcium: 8.5 mg/dL — ABNORMAL LOW (ref 8.9–10.3)
Chloride: 104 mmol/L (ref 98–111)
Creatinine, Ser: 1.54 mg/dL — ABNORMAL HIGH (ref 0.61–1.24)
GFR calc Af Amer: 53 mL/min — ABNORMAL LOW (ref >60)
GFR, EST NON AFRICAN AMERICAN: 46 mL/min — AB (ref >60)
Glucose, Bld: 92 mg/dL (ref 70–99)
Potassium: 3.7 mmol/L (ref 3.5–5.1)
SODIUM: 138 mmol/L (ref 135–145)

## 2018-05-28 NOTE — Progress Notes (Signed)
Pharmacy Antibiotic Note  Wayne Buckley is a 67 y.o. male admitted on 05/02/2018 with MRSA bacteremia.   Pharmacy has been consulted for Daptomycin dosing. Blood cultures from 11/15 are NEG. TEE was unable to be performed due to an esophageal stricture.   CK stable  Plan: - Cont Daptomycin 730 mg (~ 8 mg/kg) IV every 24 hours - Stop date planned for 12/13   Height: 5\' 9"  (175.3 cm) Weight: 253 lb (114.8 kg) IBW/kg (Calculated) : 70.7  Temp (24hrs), Avg:99.2 F (37.3 C), Min:98.4 F (36.9 C), Max:100.2 F (37.9 C)  Recent Labs  Lab 05/23/18 0544 05/25/18 0413 05/26/18 0346 05/26/18 1245 05/27/18 0317 05/28/18 0540  WBC 7.6 8.0 7.8  --  8.9 9.9  CREATININE 1.59* 1.54*  --  1.24 1.40* 1.54*    Estimated Creatinine Clearance: 57.3 mL/min (A) (by C-G formula based on SCr of 1.54 mg/dL (H)).    No Known Allergies  Antimicrobials this admission: Cefepime 11/14>>11/15 Vanc 11/15>>11/22 Dapto 11/22>>12/13  11/14 Blood x 2 - 2/2 MRSA  11/15 MRSA PCR >> positive, CHG/Bactroban 11/16>>11/20 11/15 Blood x 2 - negative 11/21 Blood x 2 - negative 11/21 Blood x 2 - negative  Thank you for allowing pharmacy to be a part of this patient's care. Okey RegalLisa Benay Pomeroy, PharmD [phone] 05/28/2018 10:17 AM  **Pharmacist phone directory can now be found on [email or web address] listed under [facility]**

## 2018-05-28 NOTE — Progress Notes (Signed)
PROGRESS NOTE    Wayne Buckley  NUU:[id number] DOB: 03/10/1951 DOA: 05/02/2018 PCP: [name], MD     Brief Narrative:  Wayne Buckley is a 67 yo male with past medical history of traumatic brain injury, central cord syndrome, neurogenic bladder with indwelling Foley catheter, bilateral leg weakness, previous history of DVT, hypertension, alcohol abuse.  He presented to the hospital secondary to fever.  He was found to have MRSA bacteremia as well as CT abdomen concerning for ileus.  He was started on IV daptomycin.  New events last 24 hours / Subjective: No new complaints today.  Denies any pain, overt blood loss.  Assessment & Plan:   Principal Problem:   MRSA bacteremia Active Problems:   Alcohol abuse   AKI (acute kidney injury) ([place])   Central cord syndrome (HCC)   Essential hypertension   Sepsis ([place])   Ogilvie's syndrome   Chronic anemia   Hyponatremia   Sacral ulcer (HCC)   History of DVT (deep vein thrombosis)   Traumatic brain injury ([place])   Bacteremia   Ileus (HCC)   Abdominal distension   Sepsis secondary to UTI and MRSA bacteremia -Patient met criteria for sepsis at the time of admission, source is secondary to UTI and MRSA bacteremia -Repeat blood cultures 11/15 showed no growth -2D echo showed no evidence of vegetations -ID consulted,initially was placed on IV vancomycin, now changed to daptomycindue to rising creatinine.  -TEE was unsuccessful due to ?obstruction, esophagogram showed no stricture -Tunneled midline placed, continue daptomycin renally dose till December 13 -Discussed with infectious disease on-call 12/5, due to his renal dysfunction, daptomycin is best option for patient.  AKI on CKD 4, improving -Creatinine mildly elevated from yesterday, continue to monitor  Iron deficiency anemia/symptomatic anemia -Continue ferrous sulfate 325 mg twice daily -Transfuse 2 units packed red blood cells on 12/7.  No sign of overt bleeding noted.   Hemoglobin remains stable, monitor   Ileus/abdominal distention, improving -Patient has been followed by general surgery and gastroenterology, differentials including Ogilvie's syndrome, mechanical component -Esophagogram results discussed with GI, Dr. [name]. Per GI, patient will not be able to tolerate EGD with multiple comorbidities currently and no obvious stricture on the esophagram. -Regarding ileus,GIrecommended enemas, soft or solid diet, increase mobility, sit upright. Patient is not a candidate for neostigmine or colonoscopy decompression. Per GI, this is commonly seen in patients with TBI and cord compression and will spontaneously resolve. -Continue soft solids. Patient will need good bowel regimen with enema scheduled or every other day for constipation.  Urinary retention with neurogenic bladder -Per admission history patient with indwelling Foley catheter secondary to neurogenic bladder, TBI and cord compression -Follow outpatient with urology and outpatient voiding trial, patient was supposed to have appointment on 11/20. Foley catheter changed.  History of DVT, left lower extremity, diagnosed in 02/2018 -Continue Eliquis  History of TBI -Continue care at skilled nursing facility  Essential hypertension -BP stable, continue amlodipine and Coreg  History of alcohol use -Currently stable, no acute issues  Pressure injury -POA, bilateral buttocks, deep tissue injury -Wound care per nursing  Thrombocytosis -Likely reactive from sepsis, bacteremia, platelet count improving    DVT prophylaxis: Eliquis  Code Status: Full Family Communication: No family at bedside Disposition Plan: SNF once adequately treated with IV daptomycin, last day 12/13   Antimicrobials:  Anti-infectives (From admission, onward)   Start     Dose/Rate Route Frequency Ordered Stop   05/16/18 0800  DAPTOmycin (CUBICIN) 730 mg in  sodium chloride 0.9 % IVPB     730 mg 229.2 mL/hr  over 30 Minutes Intravenous Every 24 hours 05/15/18 1029 05/31/18 1759   05/16/18 0000  daptomycin (CUBICIN) IVPB     730 mg Intravenous Every 24 hours 05/15/18 1050 06/01/18 2359   05/14/18 0000  daptomycin (CUBICIN) IVPB  Status:  Discontinued     730 mg Intravenous Every 48 hours 05/14/18 1007 05/15/18    05/10/18 2000  DAPTOmycin (CUBICIN) 730 mg in sodium chloride 0.9 % IVPB  Status:  Discontinued     730 mg 229.2 mL/hr over 30 Minutes Intravenous Every 48 hours 05/10/18 1414 05/15/18 1029   05/09/18 1205  vancomycin variable dose per unstable renal function (pharmacist dosing)  Status:  Discontinued      Does not apply See admin instructions 05/09/18 1205 05/09/18 1824   05/03/18 1400  ceFEPIme (MAXIPIME) 2 g in sodium chloride 0.9 % 100 mL IVPB  Status:  Discontinued     2 g 200 mL/hr over 30 Minutes Intravenous Every 24 hours 05/02/18 1526 05/03/18 1026   05/03/18 0330  vancomycin (VANCOCIN) IVPB 750 mg/150 ml premix  Status:  Discontinued     750 mg 150 mL/hr over 60 Minutes Intravenous Every 12 hours 05/02/18 1526 05/09/18 1205   05/02/18 1500  vancomycin (VANCOCIN) IVPB 1000 mg/200 mL premix     1,000 mg 200 mL/hr over 60 Minutes Intravenous NOW 05/02/18 1448 05/02/18 1620   05/02/18 1400  vancomycin (VANCOCIN) IVPB 1000 mg/200 mL premix     1,000 mg 200 mL/hr over 60 Minutes Intravenous  Once 05/02/18 1354 05/02/18 1520   05/02/18 1400  ceFEPIme (MAXIPIME) 2 g in sodium chloride 0.9 % 100 mL IVPB     2 g 200 mL/hr over 30 Minutes Intravenous  Once 05/02/18 1354 05/02/18 1445       Objective: Vitals:   05/27/18 1719 05/27/18 2212 05/28/18 0602 05/28/18 0908  BP: (!) 142/90 109/68 117/73 128/82  Pulse: (!) 105 92 89 89  Resp: 18 19 19 18   Temp: 100.2 F (37.9 C) 99.3 F (37.4 C) 98.8 F (37.1 C) 98.4 F (36.9 C)  TempSrc: Oral Oral Oral Oral  SpO2: 96% 94% 96% 95%  Weight:      Height:        Intake/Output Summary (Last 24 hours) at 05/28/2018 1318 Last data  filed at 05/28/2018 0645 Gross per 24 hour  Intake 120 ml  Output 200 ml  Net -80 ml   Filed Weights   05/23/18 2034 05/25/18 2125 05/26/18 2207  Weight: 90.4 kg 90.5 kg 114.8 kg    Examination: General exam: Appears calm and comfortable  Respiratory system: Clear to auscultation. Respiratory effort normal. Cardiovascular system: S1 & S2 heard, RRR. No JVD, murmurs, rubs, gallops or clicks. No pedal edema. Gastrointestinal system: Abdomen is nondistended, soft and nontender. No organomegaly or masses felt. Normal bowel sounds heard. Central nervous system: Alert and oriented Extremities: Symmetric 5 x 5 power. Skin: No rashes, lesions or ulcers Psychiatry: Judgement and insight appear stable    Data Reviewed: I have personally reviewed following labs and imaging studies  CBC: Recent Labs  Lab 05/23/18 0544 05/25/18 0413 05/26/18 0346 05/27/18 0317 05/28/18 0540  WBC 7.6 8.0 7.8 8.9 9.9  HGB 7.3* 6.8* 8.9* 9.2* 8.9*  HCT 23.9* 21.9* 28.4* 29.3* 29.5*  MCV 87.5 87.3 87.1 87.7 88.6  PLT 551* 579* 596* 634* 893*   Basic Metabolic Panel: Recent Labs  Lab 05/23/18 0544  05/25/18 0413 05/26/18 1245 05/27/18 0317 05/28/18 0540  NA 139 139 134* 139 138  K 3.7 3.3* 3.3* 3.7 3.7  CL 106 105 106 104 104  CO2 23 24 23 24 24   GLUCOSE 94 95 85 99 92  BUN 17 13 9 10 16   CREATININE 1.59* 1.54* 1.24 1.40* 1.54*  CALCIUM 8.5* 8.4* 8.0* 8.5* 8.5*   GFR: Estimated Creatinine Clearance: 57.3 mL/min (A) (by C-G formula based on SCr of 1.54 mg/dL (H)). Liver Function Tests: No results for input(s): AST, ALT, ALKPHOS, BILITOT, PROT, ALBUMIN in the last 168 hours. No results for input(s): LIPASE, AMYLASE in the last 168 hours. No results for input(s): AMMONIA in the last 168 hours. Coagulation Profile: No results for input(s): INR, PROTIME in the last 168 hours. Cardiac Enzymes: Recent Labs  Lab 05/24/18 0646 05/27/18 0317  CKTOTAL 41* 49   BNP (last 3 results) No results  for input(s): PROBNP in the last 8760 hours. HbA1C: No results for input(s): HGBA1C in the last 72 hours. CBG: No results for input(s): GLUCAP in the last 168 hours. Lipid Profile: No results for input(s): CHOL, HDL, LDLCALC, TRIG, CHOLHDL, LDLDIRECT in the last 72 hours. Thyroid Function Tests: No results for input(s): TSH, T4TOTAL, FREET4, T3FREE, THYROIDAB in the last 72 hours. Anemia Panel: No results for input(s): VITAMINB12, FOLATE, FERRITIN, TIBC, IRON, RETICCTPCT in the last 72 hours. Sepsis Labs: No results for input(s): PROCALCITON, LATICACIDVEN in the last 168 hours.  No results found for this or any previous visit (from the past 240 hour(s)).     Radiology Studies: No results found.    Scheduled Meds: . sodium chloride   Intravenous Once  . amLODipine  10 mg Oral Daily  . apixaban  5 mg Oral BID  . carvedilol  12.5 mg Oral BID WC  . collagenase   Topical Daily  . ferrous sulfate  325 mg Oral BID WC  . liver oil-zinc oxide   Topical QID   Continuous Infusions: . sodium chloride 500 mL (05/25/18 2334)  . DAPTOmycin (CUBICIN)  IV 730 mg (05/27/18 1728)     LOS: 26 days    Time spent: 20 minutes   Dessa Phi, DO [facility] [email or web address] Password [email or web address] 05/28/2018, 1:18 PM

## 2018-05-29 DIAGNOSIS — E871 Hypo-osmolality and hyponatremia: Secondary | ICD-10-CM

## 2018-05-29 LAB — CBC
HCT: 28.3 % — ABNORMAL LOW (ref 39.0–52.0)
Hemoglobin: 8.8 g/dL — ABNORMAL LOW (ref 13.0–17.0)
MCH: 27.2 pg (ref 26.0–34.0)
MCHC: 31.1 g/dL (ref 30.0–36.0)
MCV: 87.6 fL (ref 80.0–100.0)
Platelets: 590 10*3/uL — ABNORMAL HIGH (ref 150–400)
RBC: 3.23 MIL/uL — ABNORMAL LOW (ref 4.22–5.81)
RDW: 14.7 % (ref 11.5–15.5)
WBC: 8.3 10*3/uL (ref 4.0–10.5)
nRBC: 0 % (ref 0.0–0.2)

## 2018-05-29 LAB — BASIC METABOLIC PANEL
Anion gap: 8 (ref 5–15)
BUN: 14 mg/dL (ref 8–23)
CO2: 26 mmol/L (ref 22–32)
Calcium: 8.6 mg/dL — ABNORMAL LOW (ref 8.9–10.3)
Chloride: 105 mmol/L (ref 98–111)
Creatinine, Ser: 1.38 mg/dL — ABNORMAL HIGH (ref 0.61–1.24)
GFR calc non Af Amer: 52 mL/min — ABNORMAL LOW (ref >60)
Glucose, Bld: 92 mg/dL (ref 70–99)
Potassium: 3.4 mmol/L — ABNORMAL LOW (ref 3.5–5.1)
SODIUM: 139 mmol/L (ref 135–145)

## 2018-05-29 NOTE — NC FL2 (Signed)
[place] MEDICAID FL2 LEVEL OF CARE SCREENING TOOL     IDENTIFICATION  Patient Name: Wayne Buckley Birthdate: 09/10/1950 Sex: male Admission Date (Current Location): 05/02/2018  [facility] [place]   Facility and Address:  [facility], [address], [place], [place] [address]      Provider Number: [id number]  Attending Physician Name and Address:  Calvert Cantorizwan, Saima, MD  Relative Name and Phone Number:  Arvella NighDenise Kleinsasser - [phone]    Current Level of Care: Hospital Recommended Level of Care: Skilled Nursing Facility Prior Approval Number:    Date Approved/Denied:   PASRR Number: [id number] [id number]  Discharge Plan: SNF    Current Diagnoses: Patient Active Problem List   Diagnosis Date Noted  . Abdominal distension   . Ileus (HCC)   . Bacteremia   . 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 Chronic anemia 05/03/2018  . Hyponatremia 05/03/2018  . Sacral ulcer (HCC) 05/03/2018  . History of DVT (deep vein thrombosis) 05/03/2018  . Traumatic brain injury (HCC) 05/03/2018  . MRSA bacteremia 05/03/2018  . Sepsis (HCC) 05/02/2018  . Acute blood loss anemia   . C5-C7 level spinal cord injury (HCC)   . Central cord syndrome (HCC)   . Neurogenic bladder   . Essential hypertension   . AKI (acute kidney injury) (HCC) 02/26/2018  . Bilateral leg weakness 02/26/2018  . Leg DVT (deep venous thromboembolism), acute, left (HCC) 02/26/2018  . Fall 02/25/2018  . Alcohol abuse 05/04/2012  . Hypokalemia 05/04/2012  . Urinary retention 05/04/2012  . Subdural hematoma, post-traumatic (HCC) 04/28/2012    Orientation RESPIRATION BLADDER Height & Weight     Self, Time, Situation, Place  Normal Continent Weight: 253 lb (114.8 kg) Height:  5\' 9"  (175.3 cm)  BEHAVIORAL SYMPTOMS/MOOD NEUROLOGICAL BOWEL NUTRITION STATUS      Continent Diet(Regular)  AMBULATORY STATUS COMMUNICATION OF NEEDS Skin   Total Care(Patient has been unable to ambulate with PT)  Verbally Other (Comment)(Incision left foot, wound left anterior 5th toe with dressing in place; wound anterior left small toe)                       Personal Care Assistance Level of Assistance  Bathing, Feeding, Dressing, Total care Bathing Assistance: Maximum assistance(Mod assist per OT) Feeding assistance: Limited assistance(Assistance with set-up) Dressing Assistance: Limited assistance(Min assist per OT) Total Care Assistance: Maximum assistance   Functional Limitations Info  Sight, Hearing, Speech Sight Info: Adequate Hearing Info: Adequate Speech Info: Adequate    SPECIAL CARE FACTORS FREQUENCY  PT (By licensed PT), OT (By licensed OT), Speech therapy     PT Frequency: Evaluated 11/5 at hospital. PT at SNF eval and treat OT Frequency: Evaluated 11/18 at hospital. OT at SNF eval and treat     Speech Therapy Frequency: Evaluated 11/18      Contractures Contractures Info: Not present    Additional Factors Info  Code Status Code Status Info: Full Allergies Info: No known allergies           Current Medications (05/29/2018):  This is the current hospital active medication list Current Facility-Administered Medications  Medication Dose Route Frequency Provider Last Rate Last Dose  . 0.9 %  sodium chloride infusion (Manually program via Guardrails IV Fluids)   Intravenous Once Blount, Andi DevonXenia T, NP      . 0.9 %  sodium chloride infusion   Intravenous PRN Vassie MomentHoldson, Marjory C, RN 10 mL/hr at 05/25/18 2334 500 mL  at 05/25/18 2334  . acetaminophen (TYLENOL) tablet 650 mg  650 mg Oral Q6H PRN Schorr, Roma Kayser, NP   650 mg at 05/22/18 2120  . amLODipine (NORVASC) tablet 10 mg  10 mg Oral Daily Rai, Ripudeep K, MD   10 mg at 05/29/18 0921  . apixaban (ELIQUIS) tablet 5 mg  5 mg Oral BID Nahser, Deloris Ping, MD   5 mg at 05/29/18 1610  . bisacodyl (DULCOLAX) suppository 10 mg  10 mg Rectal Daily PRN Noralee Stain, DO      . carvedilol (COREG) tablet 12.5 mg  12.5 mg Oral  BID WC Rai, Ripudeep K, MD   12.5 mg at 05/29/18 0900  . collagenase (SANTYL) ointment   Topical Daily Wilder N, DO      . DAPTOmycin (CUBICIN) 730 mg in sodium chloride 0.9 % IVPB  730 mg Intravenous Q24H Judyann Munson, MD 229.2 mL/hr at 05/28/18 1846 730 mg at 05/28/18 1846  . ferrous sulfate tablet 325 mg  325 mg Oral BID WC Hall, Carole N, DO   325 mg at 05/29/18 0900  . hydrALAZINE (APRESOLINE) injection 5 mg  5 mg Intravenous Q6H PRN Nahser, Deloris Ping, MD   5 mg at 05/16/18 2243  . lidocaine (XYLOCAINE) 1 % (with pres) injection   Infiltration PRN Richarda Overlie, MD   5 mL at 05/14/18 1520  . liver oil-zinc oxide (DESITIN) 40 % ointment   Topical QID Nahser, Deloris Ping, MD      . polyethylene glycol (MIRALAX / GLYCOLAX) packet 17 g  17 g Oral Daily PRN Noralee Stain, DO      . senna-docusate (Senokot-S) tablet 1 tablet  1 tablet Oral QHS PRN Noralee Stain, DO      . sodium chloride flush (NS) 0.9 % injection 10-40 mL  10-40 mL Intracatheter PRN Noralee Stain, DO   10 mL at 05/29/18 0454     Discharge Medications: Please see discharge summary for a list of discharge medications.  Relevant Imaging Results:  Relevant Lab Results:   Additional Information ss#[id number]. IV Daptomycin will end 12/13.  Cristobal Goldmann, LCSW

## 2018-05-29 NOTE — Progress Notes (Signed)
Physical Therapy Treatment Patient Details Name: Wayne Buckley MRN: [id number] DOB: 07-18-1950 Today's Date: 05/29/2018    History of Present Illness Pt is a 67 y/o male with a PMH signficant for TBI, central cord syndrome, neurogenic bladder with indwelling foley catheter, BLE weakness, prior DVT, HTN, alcohol abuse presenting to the hospital for evaluation of fever. In ED, urine culture grew MRSA and CT abdomen revealed possible large bowel dysmotility or ileus.     PT Comments    Continuing work on functional mobility and activity tolerance;  Session focused on sit>stand transfers and standing tolerance, with notable improvements; Mr. Gilkes was able to stand In fully upright standing twice, once in stedy, and once in [place] plus lift; the [place] plus lift proved to be very difficult/challenging for Casimiro Needle, and I recommend going up slowly with lots of small breaks (a start-stop sort of way) to successfully get upright -- he may need lots of encouragement to use the [place] Dec Plus again  Follow Up Recommendations  SNF;Supervision/Assistance - 24 hour     Equipment Recommendations  None recommended by PT    Recommendations for Other Services       Precautions / Restrictions Precautions Precautions: Fall Precaution Comments: indwelling suprapubic catheter    Mobility  Bed Mobility Overal bed mobility: Needs Assistance Bed Mobility: Rolling;Sidelying to Sit Rolling: Mod assist Sidelying to sit: +2 for physical assistance;HOB elevated;Max assist       General bed mobility comments: Pt was able to reach for bed rails with hand-over-hand to initiate, and good effort to pull himself towards rail. Even with his effort however, therapists still providing max assist to complete roll fully to the side and transition to EOB.   Transfers Overall transfer level: Needs assistance   Transfers: Sit to/from Stand Sit to Stand: +2 physical assistance;Max assist         General transfer comment:  Stood bed to stedy with max assist; once in stedy, cued to stand again to get to chair; initially tending to flex hips and lean trunk too far forward; Max multimodal cues to keep head and shoulders up, and he successfully stood in stedy with +2 assist; Acheived fully upright, but fatigued quickly and sat to chair without control; Then opted to try [place] Plus for sit to stand and standing trials; forst two trials wothout success as the lift was too fast, and his elbows slipped off of shelf; third try usign the mechanical lift in a start-stop fashoin to account for the one speed of the lift; acheived successful standing with PT/OT assist using bed pad to cradle hips   Ambulation/Gait                 Stairs             Wheelchair Mobility    Modified Rankin (Stroke Patients Only)       Balance   Sitting-balance support: Bilateral upper extremity supported;Feet supported(and knees blocked in [place]) Sitting balance-[name] Scale: Poor       Standing balance-[name] Scale: Zero Standing balance comment: +2 assist required                            Cognition Arousal/Alertness: Awake/alert Behavior During Therapy: [facility] for tasks assessed/performed;Impulsive Overall Cognitive Status: History of cognitive impairments - at baseline  General Comments: Wife present during session and a big motivator for pt. Would recommend wife be present for future therapy sessions      Exercises      General Comments General comments (skin integrity, edema, etc.): Wife present throughout      Pertinent Vitals/Pain Pain Assessment: Faces Faces Pain Scale: Hurts a little bit Pain Location: General grimacing with movement and ADLs; likely also related to effort Pain Descriptors / Indicators: Grimacing Pain Intervention(s): Monitored during session    Home Living                      Prior Function            PT Goals  (current goals can now be found in the care plan section) Acute Rehab PT Goals Patient Stated Goal: Wife's goal is for pt to get OOB to chair daily.  PT Goal Formulation: With family Time For Goal Achievement: 06/10/18 Potential to Achieve Goals: Good Progress towards PT goals: Progressing toward goals    Frequency    Min 2X/week      PT Plan Current plan remains appropriate    Co-evaluation PT/OT/SLP Co-Evaluation/Treatment: Yes Reason for Co-Treatment: Complexity of the patient's impairments (multi-system involvement);For patient/therapist safety;To address functional/ADL transfers PT goals addressed during session: Mobility/safety with mobility        AM-PAC PT "6 Clicks" Mobility   Outcome Measure  Help needed turning from your back to your side while in a flat bed without using bedrails?: A Lot Help needed moving from lying on your back to sitting on the side of a flat bed without using bedrails?: A Lot Help needed moving to and from a bed to a chair (including a wheelchair)?: Total Help needed standing up from a chair using your arms (e.g., wheelchair or bedside chair)?: Total Help needed to walk in hospital room?: Total Help needed climbing 3-5 steps with a railing? : Total 6 Click Score: 8    End of Session Equipment Utilized During Treatment: Gait belt Activity Tolerance: Patient tolerated treatment well Patient left: in chair;with call bell/phone within reach;with chair alarm set Nurse Communication: Mobility status;Need for lift equipment;Other (comment)(Please attend to beeping from air mattress) PT Visit Diagnosis: History of falling (Z91.81);Difficulty in walking, not elsewhere classified (R26.2)     Time: 1332-1410 PT Time Calculation (min) (ACUTE ONLY): 38 min  Charges:  $Therapeutic Activity: 23-37 mins                     Van ClinesHolly Elfego Giammarino, PT  Acute Rehabilitation Services Pager [phone] Office [phone]    Levi AlandHolly H Sylvanus Telford 05/29/2018, 4:28  PM

## 2018-05-29 NOTE — Progress Notes (Addendum)
PROGRESS NOTE    Wayne HunJames M Kallio   GNF:[id number]  DOB: 05/09/1951  DOA: 05/02/2018 PCP: Daisy Florooss, Charles Alan, MD   Brief Narrative:  Wayne Buckley  is a 67 yo male with past medical history of traumatic brain injury, central cord syndrome, neurogenic bladder with indwelling Foley catheter, bilateral leg weakness, previous history of DVT, hypertension, alcohol abuse.  He presented to the hospital secondary to fever.  He was found to have MRSA bacteremia as well as CT abdomen concerning for ileus.  He was started on IV daptomycin.   Subjective: No complaints.    Assessment & Plan:   Principal Problem:  Sepsis due to MRSA bacteremia & UTI  - cont Daptomycin until 12/13- changed to this from Vanc due to a rising Creatinine -Repeat blood cultures 11/15 showed no growth -2D echo showed no evidence of vegetations -TEE was unsuccessful due to ?obstruction, esophagogram showed no stricture  Active Problems:     AKI (acute kidney injury)   - baseline Cr about 0.8 (11/19) - Cr on admission 2.75 - has improved to about 1.38- follow  Hypokalemia - replace  Abdominal distension/ ileus - ? Ogilvie's' syndrome - tolerating soft diet- GI recommending enemas and increased mobility for distension - enema scheduled QOD  H/o TBI, cord compression - lives in SNF - chronic foley for neurogenic bladder - missed and appt on 11/20 as he was in the hospital - foley changed  Essential hypertension -BP stable, continue amlodipine and Coreg    Essential hypertension - Amlodipine- BP stable  Iron deficiency anemia and probable anemia of acute illness - cont Ferrous Sulfate - transfused 2 U PRBC on 12/7 for Hb of 6.8 - Hb stable at 8.8    History of DVT (deep vein thrombosis) - Eliquis    DVT prophylaxis: Eliquis Code Status: Full code Family Communication:  Disposition Plan: SNF when Dapto is complete Consultants:   ID  gen sugery  GI Procedures:   PICC line  2 D ECHO Study  Conclusions  - Left ventricle: The cavity size was normal. Systolic function was   normal. The estimated ejection fraction was in the range of 60%   to 65%. Wall motion was normal; there were no regional wall   motion abnormalities. - Left atrium: The atrium was mildly dilated. - Atrial septum: There was increased thickness of the septum,   consistent with lipomatous hypertrophy. - Pulmonary arteries: Systolic pressure could not be accurately   estimated.  Antimicrobials:  Anti-infectives (From admission, onward)   Start     Dose/Rate Route Frequency Ordered Stop   05/16/18 0800  DAPTOmycin (CUBICIN) 730 mg in sodium chloride 0.9 % IVPB     730 mg 229.2 mL/hr over 30 Minutes Intravenous Every 24 hours 05/15/18 1029 05/31/18 1759   05/16/18 0000  daptomycin (CUBICIN) IVPB     730 mg Intravenous Every 24 hours 05/15/18 1050 06/01/18 2359   05/14/18 0000  daptomycin (CUBICIN) IVPB  Status:  Discontinued     730 mg Intravenous Every 48 hours 05/14/18 1007 05/15/18    05/10/18 2000  DAPTOmycin (CUBICIN) 730 mg in sodium chloride 0.9 % IVPB  Status:  Discontinued     730 mg 229.2 mL/hr over 30 Minutes Intravenous Every 48 hours 05/10/18 1414 05/15/18 1029   05/09/18 1205  vancomycin variable dose per unstable renal function (pharmacist dosing)  Status:  Discontinued      Does not apply See admin instructions 05/09/18 1205 05/09/18 1824   05/03/18  1400  ceFEPIme (MAXIPIME) 2 g in sodium chloride 0.9 % 100 mL IVPB  Status:  Discontinued     2 g 200 mL/hr over 30 Minutes Intravenous Every 24 hours 05/02/18 1526 05/03/18 1026   05/03/18 0330  vancomycin (VANCOCIN) IVPB 750 mg/150 ml premix  Status:  Discontinued     750 mg 150 mL/hr over 60 Minutes Intravenous Every 12 hours 05/02/18 1526 05/09/18 1205   05/02/18 1500  vancomycin (VANCOCIN) IVPB 1000 mg/200 mL premix     1,000 mg 200 mL/hr over 60 Minutes Intravenous NOW 05/02/18 1448 05/02/18 1620   05/02/18 1400  vancomycin (VANCOCIN)  IVPB 1000 mg/200 mL premix     1,000 mg 200 mL/hr over 60 Minutes Intravenous  Once 05/02/18 1354 05/02/18 1520   05/02/18 1400  ceFEPIme (MAXIPIME) 2 g in sodium chloride 0.9 % 100 mL IVPB     2 g 200 mL/hr over 30 Minutes Intravenous  Once 05/02/18 1354 05/02/18 1445       Objective: Vitals:   05/28/18 1728 05/28/18 2117 05/29/18 0456 05/29/18 0855  BP: 128/75 115/74 128/88 130/74  Pulse: 84 80 80 89  Resp: 18 18 18 19   Temp: 98.6 F (37 C) 99.1 F (37.3 C) 98.3 F (36.8 C) 98.2 F (36.8 C)  TempSrc: Oral Oral Oral Oral  SpO2:  95% 96% 98%  Weight:  114.8 kg    Height:        Intake/Output Summary (Last 24 hours) at 05/29/2018 1508 Last data filed at 05/29/2018 1320 Gross per 24 hour  Intake 170 ml  Output 1125 ml  Net -955 ml   Filed Weights   05/25/18 2125 05/26/18 2207 05/28/18 2117  Weight: 90.5 kg 114.8 kg 114.8 kg    Examination: General exam: Appears comfortable  HEENT: PERRLA, oral mucosa moist, no sclera icterus or thrush Respiratory system: Clear to auscultation. Respiratory effort normal. Cardiovascular system: S1 & S2 heard, RRR.   Gastrointestinal system: Abdomen soft, non-tender, nondistended. Normal bowel sounds. Extremities: No cyanosis, clubbing or edema    Data Reviewed: I have personally reviewed following labs and imaging studies  CBC: Recent Labs  Lab 05/25/18 0413 05/26/18 0346 05/27/18 0317 05/28/18 0540 05/29/18 0448  WBC 8.0 7.8 8.9 9.9 8.3  HGB 6.8* 8.9* 9.2* 8.9* 8.8*  HCT 21.9* 28.4* 29.3* 29.5* 28.3*  MCV 87.3 87.1 87.7 88.6 87.6  PLT 579* 596* 634* 674* 590*   Basic Metabolic Panel: Recent Labs  Lab 05/25/18 0413 05/26/18 1245 05/27/18 0317 05/28/18 0540 05/29/18 0448  NA 139 134* 139 138 139  K 3.3* 3.3* 3.7 3.7 3.4*  CL 105 106 104 104 105  CO2 24 23 24 24 26   GLUCOSE 95 85 99 92 92  BUN 13 9 10 16 14   CREATININE 1.54* 1.24 1.40* 1.54* 1.38*  CALCIUM 8.4* 8.0* 8.5* 8.5* 8.6*   GFR: Estimated  Creatinine Clearance: 64 mL/min (A) (by C-G formula based on SCr of 1.38 mg/dL (H)). Liver Function Tests: No results for input(s): AST, ALT, ALKPHOS, BILITOT, PROT, ALBUMIN in the last 168 hours. No results for input(s): LIPASE, AMYLASE in the last 168 hours. No results for input(s): AMMONIA in the last 168 hours. Coagulation Profile: No results for input(s): INR, PROTIME in the last 168 hours. Cardiac Enzymes: Recent Labs  Lab 05/24/18 0646 05/27/18 0317  CKTOTAL 41* 49   BNP (last 3 results) No results for input(s): PROBNP in the last 8760 hours. HbA1C: No results for input(s): HGBA1C in the  last 72 hours. CBG: No results for input(s): GLUCAP in the last 168 hours. Lipid Profile: No results for input(s): CHOL, HDL, LDLCALC, TRIG, CHOLHDL, LDLDIRECT in the last 72 hours. Thyroid Function Tests: No results for input(s): TSH, T4TOTAL, FREET4, T3FREE, THYROIDAB in the last 72 hours. Anemia Panel: No results for input(s): VITAMINB12, FOLATE, FERRITIN, TIBC, IRON, RETICCTPCT in the last 72 hours. Urine analysis:    Component Value Date/Time   COLORURINE YELLOW 05/06/2018 1609   APPEARANCEUR TURBID (A) 05/06/2018 1609   LABSPEC 1.012 05/06/2018 1609   PHURINE 5.0 05/06/2018 1609   GLUCOSEU NEGATIVE 05/06/2018 1609   HGBUR NEGATIVE 05/06/2018 1609   BILIRUBINUR NEGATIVE 05/06/2018 1609   KETONESUR NEGATIVE 05/06/2018 1609   PROTEINUR NEGATIVE 05/06/2018 1609   UROBILINOGEN 0.2 05/08/2012 0314   NITRITE NEGATIVE 05/06/2018 1609   LEUKOCYTESUR NEGATIVE 05/06/2018 1609   Sepsis Labs: @LABRCNTIP (procalcitonin:4,lacticidven:4) )No results found for this or any previous visit (from the past 240 hour(s)).       Radiology Studies: No results found.    Scheduled Meds: . sodium chloride   Intravenous Once  . amLODipine  10 mg Oral Daily  . apixaban  5 mg Oral BID  . carvedilol  12.5 mg Oral BID WC  . collagenase   Topical Daily  . ferrous sulfate  325 mg Oral BID WC  .  liver oil-zinc oxide   Topical QID   Continuous Infusions: . sodium chloride 500 mL (05/25/18 2334)  . DAPTOmycin (CUBICIN)  IV 730 mg (05/28/18 1846)     LOS: 27 days    Time spent in minutes:35    Calvert Cantor, MD [facility] Pager: [email or web address] Password [email or web address] 05/29/2018, 3:08 PM

## 2018-05-29 NOTE — Clinical Social Work Note (Signed)
CSW talked with wife regarding discharge disposition and her preferences remain [facility] and [address] [facility]. Contact made with both facilities and [facility] does not have private rooms and does not expect any at the point patient will be ready for discharge. Per Dannielle KarvonenKanesha, [facility]. Updated clinicals transmitted to facility. Dannielle KarvonenKanesha also advised that wife is requesting an air mattress due to wounds and an order for an air mattress must be in patient's discharge summary. CSW will continue to follow, provide SW intervention services as needed and facilitate [facility] when ready.  Genelle BalVanessa Jacobe Study, MSW, LCSW Licensed Clinical Social Worker Clinical Social Work Department [facility] [phone]

## 2018-05-29 NOTE — Progress Notes (Addendum)
Occupational Therapy Treatment Patient Details Name: Wayne Buckley MRN: [id number] DOB: [birth year] Today's Date: 05/29/2018    History of present illness Pt is a 67 y/o male with a PMH signficant for TBI, central cord syndrome, neurogenic bladder with indwelling foley catheter, BLE weakness, prior DVT, HTN, alcohol abuse presenting to the hospital for evaluation of fever. In ED, urine culture grew MRSA and CT abdomen revealed possible large bowel dysmotility or ileus.    OT comments  Pt progressing towards established goals. Pt performing simulated toilet transfer with Mod-Max A +2 depending on fatigue and following of cues; requiring Max encouragement and cues. Pt performing transfers with sara stedy and sara plus. Pt adjusting socks by bringing ankles to knees with Min A to facilitate movement. Wife present throughout session. Continue to recommend post-acute rehab and will continue to follow acutely as admitted.    Follow Up Recommendations  SNF;Supervision/Assistance - 24 hour    Equipment Recommendations  Other (comment)(Defer to next venue)    Recommendations for Other Services      Precautions / Restrictions Precautions Precautions: Fall Precaution Comments: indwelling suprapubic catheter       Mobility Bed Mobility Overal bed mobility: Needs Assistance Bed Mobility: Rolling;Sidelying to Sit Rolling: Mod assist Sidelying to sit: Max assist;+2 for physical assistance;HOB elevated       General bed mobility comments: Pt was able to reach for bed rails with hand-over-hand to initiate, and good effort to pull himself towards rail. Even with his effort however, therapists still providing max assist to complete roll fully to the side and transition to EOB.   Transfers Overall transfer level: Needs assistance   Transfers: Sit to/from Stand Sit to Stand: +2 physical assistance;Max assist         General transfer comment: Stood bed to stedy with max assist; once in stedy,  cued to stand again to get to chair; initially tending to flex hips and lean trunk too far forward; Max multimodal cues to keep head and shoulders up, and he successfully stood in stedy with +2 assist; Acheived fully upright, but fatigued quickly and sat to chair without control; Then opted to try [place] Plus for sit to stand and standing trials; forst two trials wothout success as the lift was too fast, and his elbows slipped off of shelf; third try usign the mechanical lift in a start-stop fashoin to account for the one speed of the lift; acheived successful standing with PT/OT assist using bed pad to cradle hips     Balance Overall balance assessment: Needs assistance Sitting-balance support: Bilateral upper extremity supported;Feet supported(and knees blocked in Stedy) Sitting balance-[name] Scale: Poor       Standing balance-[name] Scale: Zero Standing balance comment: +2 assist required                           ADL either performed or assessed with clinical judgement   ADL Overall ADL's : Needs assistance/impaired                     Lower Body Dressing: Minimal assistance Lower Body Dressing Details (indicate cue type and reason): Educating pt on figure four technique for adhering socks.Min A to faciltiate ankle to knee. Max A +2 for sit<>stand Toilet Transfer: Moderate assistance;+2 for physical assistance;Maximal assistance( stedy; simulated to recliner) Toilet Transfer Details (indicate cue type and reason): Mod A +2 for sit<>Stand. Max A for sit<>Stand at second attempt.  General ADL Comments: Pt participating in sit<>stand with stedy to transfer to recliner. Once in recliner, worked on sit<>stand with sara plus to increased UE support and weight bearing through BLEs. Pt requiring Max cues and encouragement     Vision       Perception     Praxis      Cognition Arousal/Alertness: Awake/alert Behavior During Therapy: WFL for tasks  assessed/performed;Impulsive Overall Cognitive Status: History of cognitive impairments - at baseline                                 General Comments: Wife present during session and a big motivator for pt. Would recommend wife be present for future therapy sessions        Exercises     Shoulder Instructions       General Comments Wife present throughout    Pertinent Vitals/ Pain       Pain Assessment: Faces Faces Pain Scale: Hurts a little bit Pain Location: General grimacing with movement and ADLs; likely also related to effort Pain Descriptors / Indicators: Grimacing Pain Intervention(s): Monitored during session;Limited activity within patient's tolerance;Repositioned  Home Living                                          Prior Functioning/Environment              Frequency  Min 2X/week        Progress Toward Goals  OT Goals(current goals can now be found in the care plan section)  Progress towards OT goals: Progressing toward goals  Acute Rehab OT Goals Patient Stated Goal: Wife's goal is for pt to get OOB to chair daily.  OT Goal Formulation: With patient/family Time For Goal Achievement: 05/20/18 Potential to Achieve Goals: Good ADL Goals Pt Will Perform Grooming: with set-up;with supervision;sitting Pt Will Perform Upper Body Bathing: with set-up;with supervision;sitting Pt Will Perform Lower Body Bathing: with mod assist Pt Will Transfer to Toilet: with mod assist;with +2 assist;stand pivot transfer;bedside commode Additional ADL Goal #1: Pt will be able to come up to sit EOB with Mod A in prep for ADLs and tranfers OOB  Plan Discharge plan remains appropriate    Co-evaluation    PT/OT/SLP Co-Evaluation/Treatment: Yes Reason for Co-Treatment: Complexity of the patient's impairments (multi-system involvement);For patient/therapist safety;To address functional/ADL transfers PT goals addressed during session:  Mobility/safety with mobility OT goals addressed during session: ADL's and self-care      AM-PAC OT "6 Clicks" Daily Activity     Outcome Measure   Help from another person eating meals?: A Little Help from another person taking care of personal grooming?: A Little Help from another person toileting, which includes using toliet, bedpan, or urinal?: A Lot Help from another person bathing (including washing, rinsing, drying)?: A Lot Help from another person to put on and taking off regular upper body clothing?: A Lot Help from another person to put on and taking off regular lower body clothing?: A Lot 6 Click Score: 14    End of Session Equipment Utilized During Treatment: Gait belt  OT Visit Diagnosis: Unsteadiness on feet (R26.81);Other abnormalities of gait and mobility (R26.89);Muscle weakness (generalized) (M62.81);History of falling (Z91.81);Pain;Ataxia, unspecified (R27.0);Other symptoms and signs involving cognitive function   Activity Tolerance Patient tolerated treatment well   Patient Left in  chair;with call bell/phone within reach;with chair alarm set;with family/visitor present   Nurse Communication Mobility status;Need for lift equipment        Time: 1335-1413 OT Time Calculation (min): 38 min  Charges: OT General Charges $OT Visit: 1 Visit OT Treatments $Self Care/Home Management : 8-22 mins  Johnmichael Melhorn MSOT, OTR/L Acute Rehab Pager: [phone] Office: [phone]   Theodoro GristCharis M Jennye Runquist 05/29/2018, 5:26 PM

## 2018-05-30 LAB — IRON AND TIBC
IRON: 18 ug/dL — AB (ref 45–182)
Saturation Ratios: 10 % — ABNORMAL LOW (ref 17.9–39.5)
TIBC: 178 ug/dL — ABNORMAL LOW (ref 250–450)
UIBC: 160 ug/dL

## 2018-05-30 LAB — VITAMIN B12: Vitamin B-12: 391 pg/mL (ref 180–914)

## 2018-05-30 LAB — RETICULOCYTES
Immature Retic Fract: 12.7 % (ref 2.3–15.9)
RBC.: 3.17 MIL/uL — ABNORMAL LOW (ref 4.22–5.81)
Retic Count, Absolute: 36.1 10*3/uL (ref 19.0–186.0)
Retic Ct Pct: 1.1 % (ref 0.4–3.1)

## 2018-05-30 LAB — FOLATE: FOLATE: 11.5 ng/mL (ref >5.9)

## 2018-05-30 LAB — FERRITIN: Ferritin: 359 ng/mL — ABNORMAL HIGH (ref 24–336)

## 2018-05-30 NOTE — Progress Notes (Addendum)
PROGRESS NOTE    Wayne Buckley   ZOX:[id number]  DOB: 21-Feb-1951  DOA: 05/02/2018 PCP: [name], MD   Brief Narrative:  Wayne Buckley  is a 67 yo male with past medical history of traumatic brain injury, central cord syndrome, neurogenic bladder with indwelling Foley catheter, bilateral leg weakness, previous history of DVT, hypertension, alcohol abuse.  He presented to the hospital secondary to fever.  He was found to have MRSA bacteremia as well as CT abdomen concerning for ileus.  He was started on IV daptomycin.   Subjective: No complaints.    Assessment & Plan:   Principal Problem:  Sepsis due to MRSA bacteremia & UTI in setting of chronic foley (CAUTI) - cont Daptomycin until 12/13- changed to this from Vanc due to a rising Creatinine -Repeat blood cultures 11/15 showed no growth -2D echo showed no evidence of vegetations -TEE was unsuccessful due to ?obstruction, esophagogram showed no stricture  Active Problems:     AKI (acute kidney injury)   - baseline Cr about 0.8 (11/19) - Cr on admission 2.75 - has improved to about 1.38   Hypokalemia - replace  Abdominal distension/ ileus - ? Ogilvie's' syndrome - tolerating soft diet- GI recommending enemas and increased mobility for distension - enemas given- abdomen remains soft now  H/o TBI, cord compression - lives in SNF - chronic foley for neurogenic bladder - missed and appt on 11/20 to change foley as he was in the hospital and thus foley changed in hospital  Essential hypertension -BP stable, continue amlodipine and Coreg    Iron deficiency anemia and probable anemia of acute illness - cont Ferrous Sulfate - transfused 2 U PRBC on 12/7 for Hb of 6.8 (likely anemia due to acute illness) - Hb stable at 8.8 - anemia panel reviewed    History of DVT (deep vein thrombosis) - Eliquis    DVT prophylaxis: Eliquis Code Status: Full code Family Communication:  Disposition Plan: SNF when Dapto is  complete Consultants:   ID  gen sugery  GI Procedures:   PICC line  2 D ECHO Study Conclusions  - Left ventricle: The cavity size was normal. Systolic function was   normal. The estimated ejection fraction was in the range of 60%   to 65%. Wall motion was normal; there were no regional wall   motion abnormalities. - Left atrium: The atrium was mildly dilated. - Atrial septum: There was increased thickness of the septum,   consistent with lipomatous hypertrophy. - Pulmonary arteries: Systolic pressure could not be accurately   estimated.  Antimicrobials:  Anti-infectives (From admission, onward)   Start     Dose/Rate Route Frequency Ordered Stop   05/16/18 0800  DAPTOmycin (CUBICIN) 730 mg in sodium chloride 0.9 % IVPB     730 mg 229.2 mL/hr over 30 Minutes Intravenous Every 24 hours 05/15/18 1029 05/31/18 1759   05/16/18 0000  daptomycin (CUBICIN) IVPB     730 mg Intravenous Every 24 hours 05/15/18 1050 06/01/18 2359   05/14/18 0000  daptomycin (CUBICIN) IVPB  Status:  Discontinued     730 mg Intravenous Every 48 hours 05/14/18 1007 05/15/18    05/10/18 2000  DAPTOmycin (CUBICIN) 730 mg in sodium chloride 0.9 % IVPB  Status:  Discontinued     730 mg 229.2 mL/hr over 30 Minutes Intravenous Every 48 hours 05/10/18 1414 05/15/18 1029   05/09/18 1205  vancomycin variable dose per unstable renal function (pharmacist dosing)  Status:  Discontinued  Does not apply See admin instructions 05/09/18 1205 05/09/18 1824   05/03/18 1400  ceFEPIme (MAXIPIME) 2 g in sodium chloride 0.9 % 100 mL IVPB  Status:  Discontinued     2 g 200 mL/hr over 30 Minutes Intravenous Every 24 hours 05/02/18 1526 05/03/18 1026   05/03/18 0330  vancomycin (VANCOCIN) IVPB 750 mg/150 ml premix  Status:  Discontinued     750 mg 150 mL/hr over 60 Minutes Intravenous Every 12 hours 05/02/18 1526 05/09/18 1205   05/02/18 1500  vancomycin (VANCOCIN) IVPB 1000 mg/200 mL premix     1,000 mg 200 mL/hr over  60 Minutes Intravenous NOW 05/02/18 1448 05/02/18 1620   05/02/18 1400  vancomycin (VANCOCIN) IVPB 1000 mg/200 mL premix     1,000 mg 200 mL/hr over 60 Minutes Intravenous  Once 05/02/18 1354 05/02/18 1520   05/02/18 1400  ceFEPIme (MAXIPIME) 2 g in sodium chloride 0.9 % 100 mL IVPB     2 g 200 mL/hr over 30 Minutes Intravenous  Once 05/02/18 1354 05/02/18 1445       Objective: Vitals:   05/29/18 1652 05/29/18 2228 05/30/18 0500 05/30/18 0901  BP: 126/72 120/74 118/69 120/78  Pulse: 82 82 80 79  Resp: 18 18 17 18   Temp: 98.6 F (37 C) 99.2 F (37.3 C) 98.7 F (37.1 C) (!) 97.5 F (36.4 C)  TempSrc: Oral Oral  Oral  SpO2: 97% 95% 98% 96%  Weight:      Height:        Intake/Output Summary (Last 24 hours) at 05/30/2018 1204 Last data filed at 05/30/2018 0600 Gross per 24 hour  Intake 654.97 ml  Output 525 ml  Net 129.97 ml   Filed Weights   05/25/18 2125 05/26/18 2207 05/28/18 2117  Weight: 90.5 kg 114.8 kg 114.8 kg    Examination: General exam: Appears comfortable - has difficulty with speaking and following directions HEENT: PERRLA, oral mucosa moist, no sclera icterus or thrush Respiratory system: Clear to auscultation. Respiratory effort normal. Cardiovascular system: S1 & S2 heard, RRR.   Gastrointestinal system: Abdomen soft, non-tender, nondistended. Normal bowel sounds. Extremities: No cyanosis, clubbing or edema    Data Reviewed: I have personally reviewed following labs and imaging studies  CBC: Recent Labs  Lab 05/25/18 0413 05/26/18 0346 05/27/18 0317 05/28/18 0540 05/29/18 0448  WBC 8.0 7.8 8.9 9.9 8.3  HGB 6.8* 8.9* 9.2* 8.9* 8.8*  HCT 21.9* 28.4* 29.3* 29.5* 28.3*  MCV 87.3 87.1 87.7 88.6 87.6  PLT 579* 596* 634* 674* 590*   Basic Metabolic Panel: Recent Labs  Lab 05/25/18 0413 05/26/18 1245 05/27/18 0317 05/28/18 0540 05/29/18 0448  NA 139 134* 139 138 139  K 3.3* 3.3* 3.7 3.7 3.4*  CL 105 106 104 104 105  CO2 24 23 24 24 26     GLUCOSE 95 85 99 92 92  BUN 13 9 10 16 14   CREATININE 1.54* 1.24 1.40* 1.54* 1.38*  CALCIUM 8.4* 8.0* 8.5* 8.5* 8.6*   GFR: Estimated Creatinine Clearance: 64 mL/min (A) (by C-G formula based on SCr of 1.38 mg/dL (H)). Liver Function Tests: No results for input(s): AST, ALT, ALKPHOS, BILITOT, PROT, ALBUMIN in the last 168 hours. No results for input(s): LIPASE, AMYLASE in the last 168 hours. No results for input(s): AMMONIA in the last 168 hours. Coagulation Profile: No results for input(s): INR, PROTIME in the last 168 hours. Cardiac Enzymes: Recent Labs  Lab 05/24/18 0646 05/27/18 0317  CKTOTAL 41* 49   BNP (  last 3 results) No results for input(s): PROBNP in the last 8760 hours. HbA1C: No results for input(s): HGBA1C in the last 72 hours. CBG: No results for input(s): GLUCAP in the last 168 hours. Lipid Profile: No results for input(s): CHOL, HDL, LDLCALC, TRIG, CHOLHDL, LDLDIRECT in the last 72 hours. Thyroid Function Tests: No results for input(s): TSH, T4TOTAL, FREET4, T3FREE, THYROIDAB in the last 72 hours. Anemia Panel: Recent Labs    05/30/18 0430 05/30/18 0431  VITAMINB12  --  391  FOLATE 11.5  --   FERRITIN 359*  --   TIBC 178*  --   IRON 18*  --   RETICCTPCT 1.1  --    Urine analysis:    Component Value Date/Time   COLORURINE YELLOW 05/06/2018 1609   APPEARANCEUR TURBID (A) 05/06/2018 1609   LABSPEC 1.012 05/06/2018 1609   PHURINE 5.0 05/06/2018 1609   GLUCOSEU NEGATIVE 05/06/2018 1609   HGBUR NEGATIVE 05/06/2018 1609   BILIRUBINUR NEGATIVE 05/06/2018 1609   KETONESUR NEGATIVE 05/06/2018 1609   PROTEINUR NEGATIVE 05/06/2018 1609   UROBILINOGEN 0.2 05/08/2012 0314   NITRITE NEGATIVE 05/06/2018 1609   LEUKOCYTESUR NEGATIVE 05/06/2018 1609   Sepsis Labs: @LABRCNTIP (procalcitonin:4,lacticidven:4) )No results found for this or any previous visit (from the past 240 hour(s)).       Radiology Studies: No results found.    Scheduled Meds: .  sodium chloride   Intravenous Once  . amLODipine  10 mg Oral Daily  . apixaban  5 mg Oral BID  . carvedilol  12.5 mg Oral BID WC  . collagenase   Topical Daily  . ferrous sulfate  325 mg Oral BID WC  . liver oil-zinc oxide   Topical QID   Continuous Infusions: . sodium chloride 500 mL (05/25/18 2334)  . DAPTOmycin (CUBICIN)  IV 730 mg (05/29/18 1847)     LOS: 28 days    Time spent in minutes:35    Calvert CantorSaima Aviya Jarvie, MD [facility] Pager: [email or web address] Password [email or web address] 05/30/2018, 12:04 PM

## 2018-05-31 DIAGNOSIS — G9520 Unspecified cord compression: Secondary | ICD-10-CM

## 2018-05-31 MED ORDER — POLYETHYLENE GLYCOL 3350 17 G PO PACK: 17.0000 g | PACK | Freq: Every day | ORAL | 0 refills | Status: AC | PRN

## 2018-05-31 MED ORDER — FLEET ENEMA 7-19 GM/118ML RE ENEM: 1.0000 | ENEMA | Freq: Every day | RECTAL | 0 refills | Status: AC | PRN

## 2018-05-31 MED ORDER — SODIUM CHLORIDE 0.9 % IV SOLN
730.0000 mg | Freq: Once | INTRAVENOUS | Status: AC
  Administered 2018-05-31: 730 mg via INTRAVENOUS
  Filled 2018-05-31: qty 14.6

## 2018-05-31 MED ORDER — FERROUS SULFATE 325 (65 FE) MG PO TABS: 325.0000 mg | ORAL_TABLET | Freq: Two times a day (BID) | ORAL | 3 refills | Status: AC

## 2018-05-31 NOTE — Discharge Summary (Addendum)
Physician Discharge Summary  Wayne Buckley ZOX:[id number] DOB: Oct 27, 1950 DOA: 05/02/2018  PCP: [name], MD  Admit date: 05/02/2018 Discharge date: 05/31/2018  Admitted From: SNF  Disposition:  SNF   Recommendations for Outpatient Follow-up:  1. F/u Bmet  In 3 days- cont to follow until renal function stabalizes 2. Wife requests an air mattress  Discharge Condition:  stable   Code Status: Full code  Consultants:   ID  gen sugery  GI   Discharge Diagnoses:  Principal Problem:   MRSA bacteremia and UTI> Sepsis Active Problems: Ileus (HCC)   AKI (acute kidney injury) (HCC)   Central cord syndrome (HCC)   Essential hypertension   Sepsis (HCC)   Ogilvie's syndrome   Chronic anemia   Sacral ulcer (HCC)   History of DVT (deep vein thrombosis)   Traumatic brain injury (HCC)       Brief Summary: Wayne Buckley  is an unfortunate 67 yo male with past medical history of traumatic brain injury, central cord syndrome, neurogenic bladder with indwelling Foley catheter, bilateral leg weakness, previous history of DVT, hypertension, alcohol abuse. He presented to the hospital secondary to fever. He was found to have MRSA bacteremia and imaging suggestive of an ileus.    Hospital Course:  Sepsis due to MRSA bacteremia & UTI in setting of chronic foley (CAUTI) -Repeat blood cultures 11/15 showed no growth -2D echo showed no evidence of vegetations -TEE was unsuccessful due to ?obstruction, esophagogram showed no stricture - Initially started on Vanc however due to rise in Cr, this was changed to Daptomycin-per ID cont Daptomycin until 12/13 which is today- unfortunately his facility could not pay for Daptomycin and thus he was kept in the hospital for ongoing treatment.   Active Problems:     AKI (acute kidney injury)   - baseline Cr about 0.8 (11/19) - Cr on admission 2.75 - has improved to about 1.38 - please continue to follow as oupt to see if it reaches  baseline over the next 1-2 wks   Abdominal distension/ ileus- OPgilvie's ? - - treated with laxatives and enema's GI consult was requested as well - tolerating soft diet- GI recommending enemas and increased mobility for distension - enemas given- abdomen remains soft now  Hypokalemia - replaced   H/o TBI, cord compression - lives in SNF - chronic foley for neurogenic bladder - missed and appt on 11/20 to change foley as he was in the hospital and thus foley changed in hospital  Essential hypertension -BP stable, continue amlodipine and Coreg    Iron deficiency anemia and probable anemia of acute illness - cont Ferrous Sulfate - transfused 2 U PRBC on 12/7 for Hb of 6.8 (likely anemia due to acute illness) - Hb stable at 8.8 - anemia panel reviewed    History of DVT (deep vein thrombosis) - Eliquis   Procedures:   PICC line  2 D ECHO Study Conclusions  - Left ventricle: The cavity size was normal. Systolic function was normal. The estimated ejection fraction was in the range of 60% to 65%. Wall motion was normal; there were no regional wall motion abnormalities. - Left atrium: The atrium was mildly dilated. - Atrial septum: There was increased thickness of the septum, consistent with lipomatous hypertrophy. - Pulmonary arteries: Systolic pressure could not be accurately estimated.    Discharge Exam: Vitals:   05/30/18 2020 05/31/18 0517  BP: 121/83 126/87  Pulse: 80 89  Resp: 20 20  Temp: 98.5 F (  36.9 C) 98.7 F (37.1 C)  SpO2: 95% 93%   Vitals:   05/30/18 0901 05/30/18 1844 05/30/18 2020 05/31/18 0517  BP: 120/78 129/76 121/83 126/87  Pulse: 79 79 80 89  Resp: 18 18 20 20   Temp: (!) 97.5 F (36.4 C) 98.4 F (36.9 C) 98.5 F (36.9 C) 98.7 F (37.1 C)  TempSrc: Oral Oral Oral   SpO2: 96% 96% 95% 93%  Weight:      Height:        General: Pt is alert, awake, not in acute distress Cardiovascular: RRR, S1/S2 +, no rubs, no  gallops Respiratory: CTA bilaterally, no wheezing, no rhonchi Abdominal: Soft, NT, ND, bowel sounds + Extremities: no edema, no cyanosis   Discharge Instructions  Discharge Instructions    Home infusion instructions [facility] May follow [facility] Dosing Protocol; May administer Cathflo as needed to maintain patency of vascular access device.; Flushing of vascular access device: per [facility] Protocol: 0.9% NaCl pre/post medica...   Complete by:  As directed    Instructions:  May follow [facility] Dosing Protocol   Instructions:  May administer Cathflo as needed to maintain patency of vascular access device.   Instructions:  Flushing of vascular access device: per [facility] Protocol: 0.9% NaCl pre/post medication administration and prn patency; Heparin 100 u/ml, 5ml for implanted ports and Heparin 10u/ml, 5ml for all other central venous catheters.   Instructions:  May follow [facility] Anaphylaxis Protocol for First Dose Administration in the home: 0.9% NaCl at 25-50 ml/hr to maintain IV access for protocol meds. Epinephrine 0.3 ml IV/IM PRN and Benadryl 25-50 IV/IM PRN s/s of anaphylaxis.   Instructions:  [facility] Infusion Coordinator (RN) to assist per patient IV care needs in the home PRN.   Increase activity slowly   Complete by:  As directed      Allergies as of 05/31/2018   No Known Allergies     Medication List    STOP taking these medications   cefTRIAXone 1 g injection Commonly known as:  ROCEPHIN   hydrALAZINE 50 MG tablet Commonly known as:  APRESOLINE   hydrochlorothiazide 25 MG tablet Commonly known as:  HYDRODIURIL   nitrofurantoin (macrocrystal-monohydrate) 100 MG capsule Commonly known as:  MACROBID   potassium chloride SA 20 MEQ tablet Commonly known as:  K-DUR,KLOR-CON     TAKE these medications   acetaminophen 325 MG tablet Commonly known as:  TYLENOL Take 2 tablets (650 mg total) by mouth every 6 (six) hours as needed for mild pain. What  changed:  Another medication with the same name was removed. Continue taking this medication, and follow the directions you see here.   amLODipine 10 MG tablet Commonly known as:  NORVASC Take 1 tablet (10 mg total) by mouth daily.   apixaban 5 MG Tabs tablet Commonly known as:  ELIQUIS Take 1 tablet (5 mg total) by mouth 2 (two) times daily.   ascorbic acid 1000 MG tablet Commonly known as:  VITAMIN C Take 1 tablet (1,000 mg total) by mouth 2 (two) times daily.   bisacodyl 10 MG suppository Commonly known as:  DULCOLAX Place 1 suppository (10 mg total) rectally daily at 6 (six) AM.   carvedilol 12.5 MG tablet Commonly known as:  COREG Take 1 tablet (12.5 mg total) by mouth 2 (two) times daily with a meal.   collagenase ointment Commonly known as:  SANTYL Apply topically daily. Cleanse gluteal wounds with NS.  Apply Santyl to open areas.  Cover with NS moist 2x2 and foam dressings.  Peel back foam and change daily. Foam is changed every three days and PRN soilage.   ferrous sulfate 325 (65 FE) MG tablet Take 1 tablet (325 mg total) by mouth 2 (two) times daily with a meal.   liver oil-zinc oxide 40 % ointment Commonly known as:  DESITIN Apply topically 4 (four) times daily. Apply to sacrum, coccyx, buttocks.   polyethylene glycol packet Commonly known as:  MIRALAX / GLYCOLAX Take 17 g by mouth 2 (two) times daily.   polyethylene glycol packet Commonly known as:  MIRALAX / GLYCOLAX Take 17 g by mouth daily as needed for mild constipation or moderate constipation.   senna-docusate 8.6-50 MG tablet Commonly known as:  Senokot-S Take 1 tablet by mouth 2 (two) times daily.   sodium phosphate 7-19 GM/118ML Enem Place 133 mLs (1 enema total) rectally daily as needed for severe constipation.   tamsulosin 0.4 MG Caps capsule Commonly known as:  FLOMAX Take 1 capsule (0.4 mg total) by mouth daily.            Home Infusion Instuctions  (From admission, onward)          Start     Ordered   05/14/18 0000  Home infusion instructions [facility] May follow [facility] Dosing Protocol; May administer Cathflo as needed to maintain patency of vascular access device.; Flushing of vascular access device: per [facility] Protocol: 0.9% NaCl pre/post medica...    Question Answer Comment  Instructions May follow [facility] Dosing Protocol   Instructions May administer Cathflo as needed to maintain patency of vascular access device.   Instructions Flushing of vascular access device: per [facility] Protocol: 0.9% NaCl pre/post medication administration and prn patency; Heparin 100 u/ml, 5ml for implanted ports and Heparin 10u/ml, 5ml for all other central venous catheters.   Instructions May follow [facility] Anaphylaxis Protocol for First Dose Administration in the home: 0.9% NaCl at 25-50 ml/hr to maintain IV access for protocol meds. Epinephrine 0.3 ml IV/IM PRN and Benadryl 25-50 IV/IM PRN s/s of anaphylaxis.   Instructions [facility] Infusion Coordinator (RN) to assist per patient IV care needs in the home PRN.      05/14/18 1007          Contact information for follow-up providers    Judyann Munson, MD Follow up.   Specialty:  Infectious Diseases Why:  12/18 @ 3pm. If you are not able to make this appointment please call to reschedule.  [address] [place] [address] [phone]            Contact information for after-discharge care    Destination    [facility] .   Service:  Skilled Nursing Contact information: [address] [place] [address] [phone]                 No Known Allergies   Procedures/Studies:   Ct Abdomen Pelvis Wo Contrast  Result Date: 05/02/2018 CLINICAL DATA:  Abdominal distention EXAM: CT ABDOMEN AND PELVIS WITHOUT CONTRAST TECHNIQUE: Multidetector CT imaging of the abdomen and pelvis was performed following the standard protocol  without IV contrast. COMPARISON:  Same day radiographs of the abdomen and pelvis FINDINGS: Lower chest: Top-normal heart size. Trace pleural effusions with bibasilar atelectasis. Hepatobiliary: No focal liver abnormality is seen. No gallstones, gallbladder wall thickening, or biliary dilatation. Pancreas: Unremarkable. No pancreatic ductal dilatation or surrounding inflammatory changes.  Spleen: Normal in size without focal abnormality. Adrenals/Urinary Tract: Normal bilateral adrenal glands. No nephrolithiasis. Motion artifacts limit assessment at the level of the kidneys however there does appear to be a hypodense cystic lesion of the interpolar left kidney measuring up to 6 cm. No nephrolithiasis nor hydroureteronephrosis. Foley decompressed urinary bladder. Stomach/Bowel: Small hiatal hernia. Decompressed stomach and small bowel loops. Normal small bowel rotation. Moderate gaseous distention of the colon up to 9 cm at the level of the cecum with liquid stool noted within from cecum to distal descending colon likely representing large bowel dysmotility or ileus. Circumferential thickening surrounding a small stool ball is noted within the rectum. Mild stercoral proctitis is not excluded. Vascular/Lymphatic: IVC filter in place. Atherosclerosis of the aorta without aneurysm. No adenopathy. Reproductive: Normal size prostate. Other: Soft tissue anasarca. No free air or free fluid. Musculoskeletal: Status post ventral hernia repair. Small amount of fat in the right inguinal canal. IMPRESSION: 1. Moderate gaseous distention of the colon up to 9 cm at the level of the cecum with liquid stool noted within from cecum to distal descending colon. Findings may represent a large bowel dysmotility or ileus. Mechanical source of obstruction is identified. 2. Circumferential thickening surrounding a small stool ball within the rectum. Mild stercoral proctitis could have this appearance. 3. 6 cm hypodense cystic lesion of the  interpolar left kidney, likely a simple cyst however further characterization is limited due to motion. 4. IVC filter in place. 5. Mild soft tissue anasarca. Electronically Signed   By: Tollie Eth M.D.   On: 05/02/2018 16:46   Dg Abdomen 1 View  Result Date: 05/02/2018 CLINICAL DATA:  67 y/o  M; abdominal distention. EXAM: ABDOMEN - 1 VIEW COMPARISON:  05/02/2018 CT abdomen and pelvis. FINDINGS: Diffuse air-filled distention of the large bowel. Upper abdomen is excluded from the field of view. IVC filter in situ. Advanced degenerative changes of the visible lumbar spine. Lower abdominal wall mesh hernia repair anchors noted. IMPRESSION: Diffuse air-filled distention of the large bowel, possibly chronic large bowel dysmotility or ileus. Electronically Signed   By: Mitzi Hansen M.D.   On: 05/02/2018 20:39   Dg Esophagus  Result Date: 05/09/2018 CLINICAL DATA:  Dysphagia EXAM: ESOPHOGRAM/BARIUM SWALLOW TECHNIQUE: Single contrast examination was performed using  thin barium. FLUOROSCOPY TIME:  Fluoroscopy Time:  2 minutes 24 seconds Radiation Exposure Index (if provided by the fluoroscopic device): Number of Acquired Spot Images: 0 COMPARISON:  None. FINDINGS: Limited study due to patient's condition. There are no fixed strictures visualized. No reflux noted during the study. The patient had difficulty swallowing the 13 mm barium tablet which remains present in the back of the throat for several swallows, finally passing into the esophagus. This sticks in the mid to distal esophagus despite the lack of visible stricture in this area. IMPRESSION: No visible stricture seen, but the 13 mm barium tablet sticks in the mid to distal esophagus. May consider further evaluation with endoscopy. Electronically Signed   By: Charlett Nose M.D.   On: 05/09/2018 10:37   US Renal  Result Date: 05/11/2018 CLINICAL DATA:  Urinary retention EXAM: RENAL / URINARY TRACT ULTRASOUND COMPLETE COMPARISON:  CT of the  abdomen and pelvis on 05/02/2018 FINDINGS: Right Kidney: Renal measurements: 16.3 x 6.6 x 7.4 cm = volume: 415 mL . Echogenicity within normal limits. No mass or hydronephrosis visualized. Left Kidney: Renal measurements: 15.4 x 8.1 x 7.7 cm = volume: 500 mL. Echogenicity within normal limits. No mass or hydronephrosis  visualized. Simple cyst of the interpolar left kidney measures roughly 4.7 x 5.9 x 3.9 cm. Bladder: The bladder is decompressed by a Foley catheter. IMPRESSION: Unremarkable renal ultrasound. Electronically Signed   By: Irish LackGlenn  Yamagata M.D.   On: 05/11/2018 14:35   Ir Fluoro Guide Cv Line Right  Result Date: 05/14/2018 INDICATION: 67 year old with bacteremia and needs long-term IV antibiotics. Plan for tunneled central line placement. EXAM: FLUOROSCOPIC AND ULTRASOUND GUIDED PLACEMENT OF A TUNNELED CENTRAL VENOUS CATHETER Physician: Rachelle HoraAdam R. Henn, MD FLUOROSCOPY TIME:  30 seconds, 1 mGy MEDICATIONS: None ANESTHESIA/SEDATION: None PROCEDURE: Informed consent was obtained for placement of a tunneled central venous catheter. The patient was placed supine on the interventional table. Ultrasound confirmed a patent right internal jugularvein. Ultrasound images were obtained for documentation. The right neck was prepped and draped in a sterile fashion. The right neck was anesthetized with 1% lidocaine. Maximal barrier sterile technique was utilized including caps, mask, sterile gowns, sterile gloves, sterile drape, hand hygiene and skin antiseptic. A small incision was made with #11 blade scalpel. A 21 gauge needle directed into the right internal jugular vein with ultrasound guidance. A micropuncture dilator set was placed. A single lumen Powerline catheter was selected. The skin below the right clavicle was anesthetized and a small incision was made with an #11 blade scalpel. A subcutaneous tunnel was formed to the vein dermatotomy site. The catheter was brought through the tunnel. The vein dermatotomy  site was dilated to accommodate a peel-away sheath. The catheter was placed through the peel-away sheath and directed into the central venous structures. The tip of the catheter was placed at the superior cavoatrial junction with fluoroscopy. Fluoroscopic images were obtained for documentation. Lumen aspirated and flushed well. Catheter was flushed with normal saline. The vein dermatotomy site was closed using Dermabond. The catheter was secured to the skin using Prolene suture. FINDINGS: Catheter tip at the superior cavoatrial junction. COMPLICATIONS: None IMPRESSION: Successful placement of a right jugular tunneled central venous catheter using ultrasound and fluoroscopic guidance. Electronically Signed   By: Richarda OverlieAdam  Henn M.D.   On: 05/14/2018 18:11   Ir [place]as Guide Vasc Access Right  Result Date: 05/14/2018 INDICATION: 67 year old with bacteremia and needs long-term IV antibiotics. Plan for tunneled central line placement. EXAM: FLUOROSCOPIC AND ULTRASOUND GUIDED PLACEMENT OF A TUNNELED CENTRAL VENOUS CATHETER Physician: Rachelle HoraAdam R. Henn, MD FLUOROSCOPY TIME:  30 seconds, 1 mGy MEDICATIONS: None ANESTHESIA/SEDATION: None PROCEDURE: Informed consent was obtained for placement of a tunneled central venous catheter. The patient was placed supine on the interventional table. Ultrasound confirmed a patent right internal jugularvein. Ultrasound images were obtained for documentation. The right neck was prepped and draped in a sterile fashion. The right neck was anesthetized with 1% lidocaine. Maximal barrier sterile technique was utilized including caps, mask, sterile gowns, sterile gloves, sterile drape, hand hygiene and skin antiseptic. A small incision was made with #11 blade scalpel. A 21 gauge needle directed into the right internal jugular vein with ultrasound guidance. A micropuncture dilator set was placed. A single lumen Powerline catheter was selected. The skin below the right clavicle was anesthetized and a  small incision was made with an #11 blade scalpel. A subcutaneous tunnel was formed to the vein dermatotomy site. The catheter was brought through the tunnel. The vein dermatotomy site was dilated to accommodate a peel-away sheath. The catheter was placed through the peel-away sheath and directed into the central venous structures. The tip of the catheter was placed at the superior cavoatrial junction  with fluoroscopy. Fluoroscopic images were obtained for documentation. Lumen aspirated and flushed well. Catheter was flushed with normal saline. The vein dermatotomy site was closed using Dermabond. The catheter was secured to the skin using Prolene suture. FINDINGS: Catheter tip at the superior cavoatrial junction. COMPLICATIONS: None IMPRESSION: Successful placement of a right jugular tunneled central venous catheter using ultrasound and fluoroscopic guidance. Electronically Signed   By: Richarda Overlie M.D.   On: 05/14/2018 18:11   Dg Chest Port 1 View  Result Date: 05/02/2018 CLINICAL DATA:  Fever EXAM: PORTABLE CHEST 1 VIEW COMPARISON:  03/03/2018 FINDINGS: The heart size and mediastinal contours are within normal limits. Both lungs are clear. The visualized skeletal structures are unremarkable. IMPRESSION: No active disease. Electronically Signed   By: Marlan Palau M.D.   On: 05/02/2018 14:35   Dg Abd 2 Views  Result Date: 05/09/2018 CLINICAL DATA:  Abdominal distention. EXAM: ABDOMEN - 2 VIEW COMPARISON:  05/04/2018, 05/02/2018.  CT 05/02/2018. FINDINGS: IVC filter noted tilted over the upper portion of the inferior vena cava in unchanged position. Distended loops of colon again noted. Colon is slightly less distended on today's exam. Several nonspecific air-filled loops of small bowel noted. No free air is identified. Degenerative changes lumbar spine. IMPRESSION: Dilated loops of colon again noted. Colon is slightly less distended on today's exam. Electronically Signed   By: Maisie Fus  Register   On:  05/09/2018 09:58   Dg Abd Portable 1v  Result Date: 05/19/2018 CLINICAL DATA:  Abdominal distention. EXAM: PORTABLE ABDOMEN - 1 VIEW COMPARISON:  05/09/2018 FINDINGS: Diffuse gaseous distension of the large bowel loops again noted. The degree of bowel distention is not significantly improved from 05/09/2018. IVC filter is noted tail ting over the upper portion of the inferior vena cava. Unchanged from previous exam. IMPRESSION: 1. No significant change and dilated loops of colon. Electronically Signed   By: Signa Kell M.D.   On: 05/19/2018 15:48   Dg Abd Portable 1v  Result Date: 05/04/2018 CLINICAL DATA:  Follow-up ileus EXAM: PORTABLE ABDOMEN - 1 VIEW COMPARISON:  05/02/2018 FINDINGS: Distended colon is again noted and stable consistent with generalized ileus. No definitive obstructive changes are seen. Postsurgical changes in the low pelvis are noted. Degenerative changes of the lumbar spine are seen. No free air is noted. IMPRESSION: Stable gaseous dilatation of the colon consistent with ileus. Electronically Signed   By: Alcide Clever M.D.   On: 05/04/2018 09:40     The results of significant diagnostics from this hospitalization (including imaging, microbiology, ancillary and laboratory) are listed below for reference.     Microbiology: No results found for this or any previous visit (from the past 240 hour(s)).   Labs: BNP (last 3 results) No results for input(s): BNP in the last 8760 hours. Basic Metabolic Panel: Recent Labs  Lab 05/25/18 0413 05/26/18 1245 05/27/18 0317 05/28/18 0540 05/29/18 0448  NA 139 134* 139 138 139  K 3.3* 3.3* 3.7 3.7 3.4*  CL 105 106 104 104 105  CO2 24 23 24 24 26   GLUCOSE 95 85 99 92 92  BUN 13 9 10 16 14   CREATININE 1.54* 1.24 1.40* 1.54* 1.38*  CALCIUM 8.4* 8.0* 8.5* 8.5* 8.6*   Liver Function Tests: No results for input(s): AST, ALT, ALKPHOS, BILITOT, PROT, ALBUMIN in the last 168 hours. No results for input(s): LIPASE, AMYLASE in the  last 168 hours. No results for input(s): AMMONIA in the last 168 hours. CBC: Recent Labs  Lab 05/25/18 0413  05/26/18 0346 05/27/18 0317 05/28/18 0540 05/29/18 0448  WBC 8.0 7.8 8.9 9.9 8.3  HGB 6.8* 8.9* 9.2* 8.9* 8.8*  HCT 21.9* 28.4* 29.3* 29.5* 28.3*  MCV 87.3 87.1 87.7 88.6 87.6  PLT 579* 596* 634* 674* 590*   Cardiac Enzymes: Recent Labs  Lab 05/27/18 0317  CKTOTAL 49   BNP: Invalid input(s): POCBNP CBG: No results for input(s): GLUCAP in the last 168 hours. D-Dimer No results for input(s): DDIMER in the last 72 hours. Hgb A1c No results for input(s): HGBA1C in the last 72 hours. Lipid Profile No results for input(s): CHOL, HDL, LDLCALC, TRIG, CHOLHDL, LDLDIRECT in the last 72 hours. Thyroid function studies No results for input(s): TSH, T4TOTAL, T3FREE, THYROIDAB in the last 72 hours.  Invalid input(s): FREET3 Anemia work up Recent Labs    05/30/18 0430 05/30/18 0431  VITAMINB12  --  391  FOLATE 11.5  --   FERRITIN 359*  --   TIBC 178*  --   IRON 18*  --   RETICCTPCT 1.1  --    Urinalysis    Component Value Date/Time   COLORURINE YELLOW 05/06/2018 1609   APPEARANCEUR TURBID (A) 05/06/2018 1609   LABSPEC 1.012 05/06/2018 1609   PHURINE 5.0 05/06/2018 1609   GLUCOSEU NEGATIVE 05/06/2018 1609   HGBUR NEGATIVE 05/06/2018 1609   BILIRUBINUR NEGATIVE 05/06/2018 1609   KETONESUR NEGATIVE 05/06/2018 1609   PROTEINUR NEGATIVE 05/06/2018 1609   UROBILINOGEN 0.2 05/08/2012 0314   NITRITE NEGATIVE 05/06/2018 1609   LEUKOCYTESUR NEGATIVE 05/06/2018 1609   Sepsis Labs Invalid input(s): PROCALCITONIN,  WBC,  LACTICIDVEN Microbiology No results found for this or any previous visit (from the past 240 hour(s)).   Time coordinating discharge in minutes: 65  SIGNED:   Calvert Cantor, MD  [facility] 05/31/2018, 8:48 AM Pager   If 7PM-7AM, please contact night-coverage [email or web address] Password [email or web address]

## 2018-05-31 NOTE — Progress Notes (Signed)
Report called and given to [place], [facility].

## 2018-05-31 NOTE — Clinical Social Work Note (Signed)
Patient medically stable to [facility] today. Facility contacted and can take patient today and discharge clinicals transmitted to facility. Mr. Phylliss BobRowe will go to room 104[facility]P [facility] [place] [facility]. Wife contacted regarding discharge and ambulance transport. CSW signing off as no other SW intervention services needed.  Genelle BalVanessa Hollister Wessler, MSW, LCSW Licensed Clinical Social Worker Clinical Social Work Department [facility] [phone]

## 2018-06-05 ENCOUNTER — Encounter

## 2018-06-05 ENCOUNTER — Inpatient Hospital Stay: Payer: BLUE CROSS/BLUE SHIELD | Admitting: Physical Medicine & Rehabilitation

## 2018-06-05 ENCOUNTER — Inpatient Hospital Stay: Payer: Medicare Other | Admitting: Internal Medicine

## 2018-07-05 ENCOUNTER — Inpatient Hospital Stay (HOSPITAL_COMMUNITY)
Admission: EM | Admit: 2018-07-05 | Discharge: 2018-07-25 | DRG: 193 | Disposition: A | Discharge status: Other Acute Inpatient Hospital | Payer: Medicare Other | Source: Skilled Nursing Facility | Attending: Internal Medicine | Admitting: Internal Medicine

## 2018-07-05 ENCOUNTER — Emergency Department (HOSPITAL_COMMUNITY): Payer: Medicare Other

## 2018-07-05 DIAGNOSIS — M4626 Osteomyelitis of vertebra, lumbar region: Secondary | ICD-10-CM | POA: Diagnosis present

## 2018-07-05 DIAGNOSIS — D649 Anemia, unspecified: Secondary | ICD-10-CM | POA: Diagnosis not present

## 2018-07-05 DIAGNOSIS — J181 Lobar pneumonia, unspecified organism: Secondary | ICD-10-CM | POA: Diagnosis not present

## 2018-07-05 DIAGNOSIS — M4620 Osteomyelitis of vertebra, site unspecified: Secondary | ICD-10-CM

## 2018-07-05 DIAGNOSIS — R4701 Aphasia: Secondary | ICD-10-CM | POA: Diagnosis present

## 2018-07-05 DIAGNOSIS — J9601 Acute respiratory failure with hypoxia: Secondary | ICD-10-CM

## 2018-07-05 DIAGNOSIS — Z87820 Personal history of traumatic brain injury: Secondary | ICD-10-CM

## 2018-07-05 DIAGNOSIS — R471 Dysarthria and anarthria: Secondary | ICD-10-CM | POA: Diagnosis present

## 2018-07-05 DIAGNOSIS — J9690 Respiratory failure, unspecified, unspecified whether with hypoxia or hypercapnia: Secondary | ICD-10-CM

## 2018-07-05 DIAGNOSIS — J9811 Atelectasis: Secondary | ICD-10-CM

## 2018-07-05 DIAGNOSIS — I959 Hypotension, unspecified: Secondary | ICD-10-CM

## 2018-07-05 DIAGNOSIS — L89320 Pressure ulcer of left buttock, unstageable: Secondary | ICD-10-CM | POA: Diagnosis present

## 2018-07-05 DIAGNOSIS — J90 Pleural effusion, not elsewhere classified: Secondary | ICD-10-CM

## 2018-07-05 DIAGNOSIS — R509 Fever, unspecified: Secondary | ICD-10-CM

## 2018-07-05 DIAGNOSIS — R7881 Bacteremia: Secondary | ICD-10-CM

## 2018-07-05 DIAGNOSIS — Z86718 Personal history of other venous thrombosis and embolism: Secondary | ICD-10-CM

## 2018-07-05 DIAGNOSIS — E876 Hypokalemia: Secondary | ICD-10-CM | POA: Diagnosis present

## 2018-07-05 DIAGNOSIS — J918 Pleural effusion in other conditions classified elsewhere: Secondary | ICD-10-CM | POA: Diagnosis present

## 2018-07-05 DIAGNOSIS — Z7901 Long term (current) use of anticoagulants: Secondary | ICD-10-CM

## 2018-07-05 DIAGNOSIS — Y95 Nosocomial condition: Secondary | ICD-10-CM | POA: Diagnosis present

## 2018-07-05 DIAGNOSIS — L89310 Pressure ulcer of right buttock, unstageable: Secondary | ICD-10-CM | POA: Diagnosis present

## 2018-07-05 DIAGNOSIS — G061 Intraspinal abscess and granuloma: Secondary | ICD-10-CM | POA: Diagnosis present

## 2018-07-05 DIAGNOSIS — S14105A Unspecified injury at C5 level of cervical spinal cord, initial encounter: Secondary | ICD-10-CM | POA: Diagnosis present

## 2018-07-05 DIAGNOSIS — D509 Iron deficiency anemia, unspecified: Secondary | ICD-10-CM | POA: Diagnosis present

## 2018-07-05 DIAGNOSIS — B9562 Methicillin resistant Staphylococcus aureus infection as the cause of diseases classified elsewhere: Secondary | ICD-10-CM | POA: Diagnosis present

## 2018-07-05 DIAGNOSIS — N400 Enlarged prostate without lower urinary tract symptoms: Secondary | ICD-10-CM | POA: Diagnosis present

## 2018-07-05 DIAGNOSIS — I10 Essential (primary) hypertension: Secondary | ICD-10-CM | POA: Diagnosis present

## 2018-07-05 DIAGNOSIS — N319 Neuromuscular dysfunction of bladder, unspecified: Secondary | ICD-10-CM | POA: Diagnosis present

## 2018-07-05 DIAGNOSIS — L8990 Pressure ulcer of unspecified site, unspecified stage: Secondary | ICD-10-CM

## 2018-07-05 DIAGNOSIS — M4624 Osteomyelitis of vertebra, thoracic region: Secondary | ICD-10-CM | POA: Diagnosis present

## 2018-07-05 DIAGNOSIS — R338 Other retention of urine: Secondary | ICD-10-CM | POA: Diagnosis present

## 2018-07-05 DIAGNOSIS — Z981 Arthrodesis status: Secondary | ICD-10-CM

## 2018-07-05 DIAGNOSIS — S069XAA Unspecified intracranial injury with loss of consciousness status unknown, initial encounter: Secondary | ICD-10-CM | POA: Diagnosis present

## 2018-07-05 DIAGNOSIS — L89152 Pressure ulcer of sacral region, stage 2: Secondary | ICD-10-CM | POA: Diagnosis not present

## 2018-07-05 DIAGNOSIS — K5900 Constipation, unspecified: Secondary | ICD-10-CM | POA: Diagnosis present

## 2018-07-05 DIAGNOSIS — S069X9A Unspecified intracranial injury with loss of consciousness of unspecified duration, initial encounter: Secondary | ICD-10-CM | POA: Diagnosis present

## 2018-07-05 DIAGNOSIS — J869 Pyothorax without fistula: Secondary | ICD-10-CM

## 2018-07-05 DIAGNOSIS — M4649 Discitis, unspecified, multiple sites in spine: Secondary | ICD-10-CM | POA: Diagnosis present

## 2018-07-05 DIAGNOSIS — R339 Retention of urine, unspecified: Secondary | ICD-10-CM | POA: Diagnosis present

## 2018-07-05 LAB — URINALYSIS, ROUTINE W REFLEX MICROSCOPIC
Bilirubin Urine: NEGATIVE
Glucose, UA: NEGATIVE mg/dL
Ketones, ur: NEGATIVE mg/dL
Nitrite: NEGATIVE
Protein, ur: NEGATIVE mg/dL
RBC / HPF: >50 RBC/hpf — ABNORMAL HIGH (ref 0–5)
Specific Gravity, Urine: 1.012 (ref 1.005–1.030)
pH: 5 (ref 5.0–8.0)

## 2018-07-05 LAB — COMPREHENSIVE METABOLIC PANEL
ALT: 11 U/L (ref 0–44)
AST: 26 U/L (ref 15–41)
Albumin: 1.8 g/dL — ABNORMAL LOW (ref 3.5–5.0)
Alkaline Phosphatase: 96 U/L (ref 38–126)
Anion gap: 12 (ref 5–15)
BUN: 22 mg/dL (ref 8–23)
CO2: 23 mmol/L (ref 22–32)
CREATININE: 0.9 mg/dL (ref 0.61–1.24)
Calcium: 9.3 mg/dL (ref 8.9–10.3)
Chloride: 103 mmol/L (ref 98–111)
GFR calc Af Amer: >60 mL/min (ref >60)
GFR calc non Af Amer: >60 mL/min (ref >60)
Glucose, Bld: 111 mg/dL — ABNORMAL HIGH (ref 70–99)
Potassium: 3.6 mmol/L (ref 3.5–5.1)
Sodium: 138 mmol/L (ref 135–145)
Total Bilirubin: 0.7 mg/dL (ref 0.3–1.2)
Total Protein: 6.3 g/dL — ABNORMAL LOW (ref 6.5–8.1)

## 2018-07-05 LAB — INFLUENZA PANEL BY PCR (TYPE A & B)
INFLAPCR: NEGATIVE
Influenza B By PCR: NEGATIVE

## 2018-07-05 LAB — CBC WITH DIFFERENTIAL/PLATELET
ABS IMMATURE GRANULOCYTES: 0.23 10*3/uL — AB (ref 0.00–0.07)
BASOS PCT: 0 %
Basophils Absolute: 0 10*3/uL (ref 0.0–0.1)
Eosinophils Absolute: 0.1 10*3/uL (ref 0.0–0.5)
Eosinophils Relative: 0 %
HCT: 23.3 % — ABNORMAL LOW (ref 39.0–52.0)
Hemoglobin: 7.2 g/dL — ABNORMAL LOW (ref 13.0–17.0)
Immature Granulocytes: 1 %
Lymphocytes Relative: 8 %
Lymphs Abs: 1.4 10*3/uL (ref 0.7–4.0)
MCH: 27.1 pg (ref 26.0–34.0)
MCHC: 30.9 g/dL (ref 30.0–36.0)
MCV: 87.6 fL (ref 80.0–100.0)
MONOS PCT: 5 %
Monocytes Absolute: 0.9 10*3/uL (ref 0.1–1.0)
Neutro Abs: 15.8 10*3/uL — ABNORMAL HIGH (ref 1.7–7.7)
Neutrophils Relative %: 86 %
Platelets: 776 10*3/uL — ABNORMAL HIGH (ref 150–400)
RBC: 2.66 MIL/uL — ABNORMAL LOW (ref 4.22–5.81)
RDW: 16.4 % — ABNORMAL HIGH (ref 11.5–15.5)
WBC: 18.4 10*3/uL — ABNORMAL HIGH (ref 4.0–10.5)
nRBC: 0 % (ref 0.0–0.2)

## 2018-07-05 LAB — PREPARE RBC (CROSSMATCH)

## 2018-07-05 LAB — POC OCCULT BLOOD, ED: Fecal Occult Bld: NEGATIVE

## 2018-07-05 LAB — RETICULOCYTES
Immature Retic Fract: 18.6 % — ABNORMAL HIGH (ref 2.3–15.9)
RBC.: 2.66 MIL/uL — ABNORMAL LOW (ref 4.22–5.81)
RETIC COUNT ABSOLUTE: 78.5 10*3/uL (ref 19.0–186.0)
Retic Ct Pct: 3 % (ref 0.4–3.1)

## 2018-07-05 LAB — PROTIME-INR
INR: 1.89
PROTHROMBIN TIME: 21.5 s — AB (ref 11.4–15.2)

## 2018-07-05 LAB — I-STAT CG4 LACTIC ACID, ED: Lactic Acid, Venous: 0.77 mmol/L (ref 0.5–1.9)

## 2018-07-05 MED ORDER — VANCOMYCIN HCL 10 G IV SOLR
2000.0000 mg | Freq: Once | INTRAVENOUS | Status: AC
  Administered 2018-07-06: 2000 mg via INTRAVENOUS
  Filled 2018-07-05: qty 2000

## 2018-07-05 MED ORDER — SODIUM CHLORIDE 0.9 % IV BOLUS
1000.0000 mL | Freq: Once | INTRAVENOUS | Status: AC
  Administered 2018-07-05: 1000 mL via INTRAVENOUS

## 2018-07-05 MED ORDER — VANCOMYCIN HCL IN DEXTROSE 1-5 GM/200ML-% IV SOLN: 1000.0000 mg | Freq: Once | INTRAVENOUS | Status: DC

## 2018-07-05 MED ORDER — METRONIDAZOLE IN NACL 5-0.79 MG/ML-% IV SOLN: 500.0000 mg | Freq: Three times a day (TID) | INTRAVENOUS | Status: DC

## 2018-07-05 MED ORDER — ACETAMINOPHEN 500 MG PO TABS
1000.0000 mg | ORAL_TABLET | Freq: Once | ORAL | Status: AC
  Administered 2018-07-05: 1000 mg via ORAL
  Filled 2018-07-05: qty 2

## 2018-07-05 MED ORDER — SODIUM CHLORIDE 0.9 % IV SOLN
2.0000 g | Freq: Once | INTRAVENOUS | Status: AC
  Administered 2018-07-05: 2 g via INTRAVENOUS
  Filled 2018-07-05: qty 2

## 2018-07-05 MED ORDER — SODIUM CHLORIDE 0.9 % IV SOLN
10.0000 mL/h | Freq: Once | INTRAVENOUS | Status: AC
  Administered 2018-07-05: 10 mL/h via INTRAVENOUS

## 2018-07-05 MED ORDER — SODIUM CHLORIDE 0.9 % IV SOLN: 2.0000 g | Freq: Once | INTRAVENOUS | Status: DC

## 2018-07-05 MED ORDER — METRONIDAZOLE IN NACL 5-0.79 MG/ML-% IV SOLN
500.0000 mg | Freq: Three times a day (TID) | INTRAVENOUS | Status: DC
  Administered 2018-07-05 – 2018-07-06 (×2): 500 mg via INTRAVENOUS
  Filled 2018-07-05 (×2): qty 100

## 2018-07-05 NOTE — ED Triage Notes (Signed)
Pt arrives to ED from [facility] with complaints of low hemoglobin since the facility checked lab work this morning. EMS reports pt's hemoglobin was 6.8, hx of same and has blood transfusions in the past. Pt has no complaints at this time, A&Ox4, denies pain at this time. Pt placed in position of comfort with bed locked and lowered, call bell in reach.

## 2018-07-05 NOTE — ED Provider Notes (Signed)
Emergency Department Provider Note   I have reviewed the triage vital signs and the nursing notes.   HISTORY  Chief Complaint Abnormal Lab   HPI Wayne Buckley is a 68 y.o. male with PMH of BPH/Neurogenic bladder with indwelling foley, HTN, TBI, and recent admit for sepsis and anemia returns to the emergency department with report of anemia on routine blood draw.  Patient's hemoglobin was reportedly 6.8 from his nursing home.  During his last admission in late December 2019 he did require 2 units PRBC.  At that time, anemia was attributed to his ongoing sepsis.  Patient is anticoagulated but denies any blood in his bowel movements.  Patient denies any generalized weakness, shortness of breath, chest pain.   Level 5 caveat: TBI  Past Medical History:  Diagnosis Date  . BPH (benign prostatic hyperplasia)   . DVT (deep venous thrombosis) (HCC) 02/2018   extensive LLE DVT  . H/O alcohol abuse   . Hypertension   . Neurogenic bowel   . Spinal cord injury, cervical region (HCC) 02/2018  . TBI (traumatic brain injury) (HCC) 2013   SDH with left partial frontal lobectomy    Patient Active Problem List   Diagnosis Date Noted  . Fever 07/06/2018  . Abdominal distension   . Ileus (HCC)   . Bacteremia   . 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 Chronic anemia 05/03/2018  . Hyponatremia 05/03/2018  . Sacral ulcer (HCC) 05/03/2018  . History of DVT (deep vein thrombosis) 05/03/2018  . Traumatic brain injury (HCC) 05/03/2018  . MRSA bacteremia 05/03/2018  . Sepsis (HCC) 05/02/2018  . Acute blood loss anemia   . C5-C7 level spinal cord injury (HCC)   . Central cord syndrome (HCC)   . Neurogenic bladder   . Essential hypertension   . AKI (acute kidney injury) (HCC) 02/26/2018  . Bilateral leg weakness 02/26/2018  . Leg DVT (deep venous thromboembolism), acute, left (HCC) 02/26/2018  . Fall 02/25/2018  . Alcohol abuse 05/04/2012  . Hypokalemia 05/04/2012  . Urinary retention 05/04/2012   . Subdural hematoma, post-traumatic (HCC) 04/28/2012    Past Surgical History:  Procedure Laterality Date  . ANTERIOR CERVICAL DECOMP/DISCECTOMY FUSION N/A 02/27/2018   Procedure: ANTERIOR CERVICAL DECOMPRESSION/DISCECTOMY FUSION C6-7;  Surgeon: Donalee Citrinram, Gary, MD;  [facility];  Service: Neurosurgery;  Laterality: N/A;  . CRANIOTOMY  04/28/2012   Procedure: CRANIOTOMY HEMATOMA EVACUATION SUBDURAL;  Surgeon: Reinaldo Meekerandy O Kritzer, MD;  Location: [facility] OR;  Service: Neurosurgery;  Laterality: Left;  Left Craniotomy for Subdural Hematoma  . IR FLUORO GUIDE CV LINE RIGHT  05/14/2018  . IR US GUIDE VASC ACCESS RIGHT  05/14/2018  . RADIOLOGY WITH ANESTHESIA N/A 02/27/2018   Procedure: MRI WITH ANESTHESIA;  Surgeon: Radiologist, Medication, MD;  Location: [facility] OR;  Service: Radiology;  Laterality: N/A;  . VENA CAVA FILTER PLACEMENT Right 02/27/2018   Procedure: INSERTION VENA-CAVA FILTER;  Surgeon: Nada LibmanBrabham, Vance W, MD;  Location: [facility] OR;  Service: Vascular;  Laterality: Right;    Allergies Patient has no known allergies.  Family History  Problem Relation Age of Onset  . Heart disease Father     Social History Social History   Tobacco Use  . Smoking status: Never Smoker  . Smokeless tobacco: Never Used  Substance Use Topics  . Alcohol use: Yes    Comment: daily   . Drug use: Not on file    Review of Systems  Constitutional: No fever/chills Eyes: No visual changes. ENT: No sore throat. Cardiovascular:  Denies chest pain. Respiratory: Denies shortness of breath. Gastrointestinal: No abdominal pain.  No nausea, no vomiting.  No diarrhea.  No constipation. Genitourinary: Negative for dysuria. Musculoskeletal: Negative for back pain. Skin: Negative for rash. Neurological: Negative for headaches, focal weakness or numbness.  10-point ROS otherwise negative.  ____________________________________________   PHYSICAL EXAM:  VITAL SIGNS: ED Triage Vitals  Enc Vitals Group     BP 07/05/18  1904 (!) 95/57     Pulse Rate 07/05/18 1904 90     Resp 07/05/18 1904 (!) 21     Temp 07/05/18 1904 (!) 101 F (38.3 C)     Temp Source 07/05/18 1904 Oral     SpO2 07/05/18 1904 98 %     Weight 07/05/18 1900 253 lb 1.4 oz (114.8 kg)     Height 07/05/18 1900 5\' 9"  (1.753 m)     Pain Score 07/05/18 1900 0   Constitutional: Alert with baseline speech change. Chronically ill-appearing but alert and providing a limited history. Eyes: Conjunctivae are normal. PERRL.  Head: Atraumatic. Nose: No congestion/rhinnorhea. Mouth/Throat: Mucous membranes are moist. Neck: No stridor.  Cardiovascular: Normal rate, regular rhythm. Good peripheral circulation. Grossly normal heart sounds.   Respiratory: Normal respiratory effort. No retractions. Lungs CTAB. Gastrointestinal: Soft and nontender. No distention. Rectal exam with soft, brown stool. No BRBPR or melena.  Musculoskeletal: No lower extremity tenderness nor edema. No gross deformities of extremities. Neurologic: Speech at baseline. No focal neuro deficits. Generalized weakness throughout.  Skin:  Skin is warm, dry and intact. No rash noted.  ____________________________________________   LABS (all labs ordered are listed, but only abnormal results are displayed)  Labs Reviewed  CBC WITH DIFFERENTIAL/PLATELET - Abnormal; Notable for the following components:      Result Value   WBC 18.4 (*)    RBC 2.66 (*)    Hemoglobin 7.2 (*)    HCT 23.3 (*)    RDW 16.4 (*)    Platelets 776 (*)    Neutro Abs 15.8 (*)    Abs Immature Granulocytes 0.23 (*)    All other components within normal limits  COMPREHENSIVE METABOLIC PANEL - Abnormal; Notable for the following components:   Glucose, Bld 111 (*)    Total Protein 6.3 (*)    Albumin 1.8 (*)    All other components within normal limits  PROTIME-INR - Abnormal; Notable for the following components:   Prothrombin Time 21.5 (*)    All other components within normal limits  RETICULOCYTES -  Abnormal; Notable for the following components:   RBC. 2.66 (*)    Immature Retic Fract 18.6 (*)    All other components within normal limits  IRON AND TIBC - Abnormal; Notable for the following components:   Iron 16 (*)    TIBC 132 (*)    Saturation Ratios 12 (*)    All other components within normal limits  FERRITIN - Abnormal; Notable for the following components:   Ferritin 851 (*)    All other components within normal limits  URINALYSIS, ROUTINE W REFLEX MICROSCOPIC - Abnormal; Notable for the following components:   APPearance TURBID (*)    Hgb urine dipstick LARGE (*)    Leukocytes, UA MODERATE (*)    RBC / HPF >50 (*)    Bacteria, UA FEW (*)    All other components within normal limits  BASIC METABOLIC PANEL - Abnormal; Notable for the following components:   Potassium 3.0 (*)    Calcium 8.6 (*)  All other components within normal limits  CBC - Abnormal; Notable for the following components:   WBC 15.2 (*)    RBC 2.82 (*)    Hemoglobin 7.7 (*)    HCT 24.6 (*)    RDW 16.0 (*)    Platelets 666 (*)    All other components within normal limits  HEMOGLOBIN AND HEMATOCRIT, BLOOD - Abnormal; Notable for the following components:   Hemoglobin 9.3 (*)    HCT 29.1 (*)    All other components within normal limits  CULTURE, BLOOD (ROUTINE X 2)  CULTURE, BLOOD (ROUTINE X 2)  URINE CULTURE  MRSA PCR SCREENING  VITAMIN B12  FOLATE  INFLUENZA PANEL BY PCR (TYPE A & B)  POC OCCULT BLOOD, ED  I-STAT CG4 LACTIC ACID, ED  TYPE AND SCREEN  PREPARE RBC (CROSSMATCH)   ____________________________________________  EKG  Sinus rhythm. Narrow QRS. Normal QTc. Non-specific ST changes. No significant change since last tracing. No STEMI.  ____________________________________________  RADIOLOGY  Dg Chest 2 View  Result Date: 07/05/2018 CLINICAL DATA:  68 y/o  M; anemia. Fever. EXAM: CHEST - 2 VIEW COMPARISON:  05/02/2018 chest radiograph. FINDINGS: Large left pleural effusion.  Left mid and lower lung zone opacities. Cardiac silhouette partially obscured by the effusion. Aortic calcific atherosclerosis. ACDF hardware noted. Clear right lung. No pneumothorax. Bones are unremarkable. IVC filter in situ. IMPRESSION: Large left pleural effusion. Left mid and lower lung zone opacities may represent associated atelectasis or pneumonia. Electronically Signed   By: Mitzi Hansen M.D.   On: 07/05/2018 22:26    ____________________________________________   PROCEDURES  Procedure(s) performed:   Procedures  CRITICAL CARE Performed by: Maia Plan Total critical care time: 35 minutes Critical care time was exclusive of separately billable procedures and treating other patients. Critical care was necessary to treat or prevent imminent or life-threatening deterioration. Critical care was time spent personally by me on the following activities: development of treatment plan with patient and/or surrogate as well as nursing, discussions with consultants, evaluation of patient's response to treatment, examination of patient, obtaining history from patient or surrogate, ordering and performing treatments and interventions, ordering and review of laboratory studies, ordering and review of radiographic studies, pulse oximetry and re-evaluation of patient's condition.  Alona Bene, MD Emergency Medicine  ____________________________________________   INITIAL IMPRESSION / ASSESSMENT AND PLAN / ED COURSE  Pertinent labs & imaging results that were available during my care of the patient were reviewed by me and considered in my medical decision making (see chart for details).  Patient presents to the emergency department with return of anemia based on labs from his nursing facility.  Patient is anticoagulated.  He is well-appearing with borderline low blood pressures SBP 90s.  Patient is not having frequent diarrhea or bowel movements to suspect an acute GI bleed.  He is  refusing a digital rectal exam which I have strongly recommended than his recurrent anemia. Patient does have leukocytosis here and fever. He is being treated currently with Levaquin.   11:00 PM Hemoccult negative. Starting PRBC. Lactate normal. Leukocytosis noted. Fever reduced with Tylenol. Patient remains awake and alert despite soft BP. His BP is responding to IVF and PRBC. Plan for abx cover unknown source and admit for continued treatment and transfusion.   Patient covered with broad spectrum abx. Large left pleural effusion on CXR. Cannot r/o underlying PNA. Patient has been on Levaquin for the last 2 days per [facility] from rehab center. BP improving with IVF and  PRBC transfusion. Anemia panel sent.   Discussed patient's case with Hospitalist, Dr. Onalee Hua to request admission. Patient and family (if present) updated with plan. Care transferred to Hospitalist service.  I reviewed all nursing notes, vitals, pertinent old records, EKGs, labs, imaging (as available).  ____________________________________________  FINAL CLINICAL IMPRESSION(S) / ED DIAGNOSES  Final diagnoses:  Symptomatic anemia  Fever, unspecified fever cause  Hypotension, unspecified hypotension type  Pleural effusion on left     MEDICATIONS GIVEN DURING THIS VISIT:  Medications  metroNIDAZOLE (FLAGYL) IVPB 500 mg (500 mg Intravenous New Bag/Given 07/06/18 0606)  tamsulosin (FLOMAX) capsule 0.4 mg (0.4 mg Oral Given 07/06/18 0851)  acetaminophen (TYLENOL) tablet 650 mg (has no administration in time range)  apixaban (ELIQUIS) tablet 5 mg (5 mg Oral Given 07/06/18 0850)  vitamin C (ASCORBIC ACID) tablet 1,000 mg (1,000 mg Oral Given 07/06/18 0849)  polyethylene glycol (MIRALAX / GLYCOLAX) packet 17 g (has no administration in time range)  ferrous sulfate tablet 325 mg (325 mg Oral Given 07/06/18 0850)  0.9 %  sodium chloride infusion ( Intravenous New Bag/Given 07/06/18 0259)  vancomycin (VANCOCIN) IVPB 1000 mg/200 mL premix  (1,000 mg Intravenous New Bag/Given 07/06/18 0853)  ceFEPIme (MAXIPIME) 2 g in sodium chloride 0.9 % 100 mL IVPB (has no administration in time range)  0.9 %  sodium chloride infusion (0 mL/hr Intravenous Stopped 07/06/18 0158)  acetaminophen (TYLENOL) tablet 1,000 mg (1,000 mg Oral Given 07/05/18 2059)  sodium chloride 0.9 % bolus 1,000 mL (0 mLs Intravenous Stopped 07/05/18 2154)  sodium chloride 0.9 % bolus 1,000 mL (0 mLs Intravenous Stopped 07/05/18 2345)  ceFEPIme (MAXIPIME) 2 g in sodium chloride 0.9 % 100 mL IVPB (0 g Intravenous Stopped 07/06/18 0014)  vancomycin (VANCOCIN) 2,000 mg in sodium chloride 0.9 % 500 mL IVPB (0 mg Intravenous Stopped 07/06/18 0259)    Note:  This document was prepared using Dragon voice recognition software and may include unintentional dictation errors.  Alona Bene, MD Emergency Medicine    , Arlyss Repress, MD 07/06/18 [phone]

## 2018-07-06 ENCOUNTER — Inpatient Hospital Stay (HOSPITAL_COMMUNITY): Payer: Medicare Other

## 2018-07-06 ENCOUNTER — Encounter (HOSPITAL_COMMUNITY): Payer: Self-pay

## 2018-07-06 ENCOUNTER — Other Ambulatory Visit: Payer: Self-pay

## 2018-07-06 DIAGNOSIS — Z86718 Personal history of other venous thrombosis and embolism: Secondary | ICD-10-CM | POA: Diagnosis not present

## 2018-07-06 DIAGNOSIS — J869 Pyothorax without fistula: Secondary | ICD-10-CM | POA: Diagnosis not present

## 2018-07-06 DIAGNOSIS — J918 Pleural effusion in other conditions classified elsewhere: Secondary | ICD-10-CM | POA: Diagnosis present

## 2018-07-06 DIAGNOSIS — Z981 Arthrodesis status: Secondary | ICD-10-CM | POA: Diagnosis not present

## 2018-07-06 DIAGNOSIS — M4624 Osteomyelitis of vertebra, thoracic region: Secondary | ICD-10-CM | POA: Diagnosis present

## 2018-07-06 DIAGNOSIS — M4626 Osteomyelitis of vertebra, lumbar region: Secondary | ICD-10-CM | POA: Diagnosis present

## 2018-07-06 DIAGNOSIS — Y95 Nosocomial condition: Secondary | ICD-10-CM | POA: Diagnosis present

## 2018-07-06 DIAGNOSIS — J9621 Acute and chronic respiratory failure with hypoxia: Secondary | ICD-10-CM | POA: Diagnosis not present

## 2018-07-06 DIAGNOSIS — S069X5S Unspecified intracranial injury with loss of consciousness greater than 24 hours with return to pre-existing conscious level, sequela: Secondary | ICD-10-CM | POA: Diagnosis not present

## 2018-07-06 DIAGNOSIS — D509 Iron deficiency anemia, unspecified: Secondary | ICD-10-CM | POA: Diagnosis present

## 2018-07-06 DIAGNOSIS — R0902 Hypoxemia: Secondary | ICD-10-CM | POA: Diagnosis not present

## 2018-07-06 DIAGNOSIS — M4620 Osteomyelitis of vertebra, site unspecified: Secondary | ICD-10-CM | POA: Diagnosis not present

## 2018-07-06 DIAGNOSIS — J90 Pleural effusion, not elsewhere classified: Secondary | ICD-10-CM | POA: Diagnosis not present

## 2018-07-06 DIAGNOSIS — R509 Fever, unspecified: Secondary | ICD-10-CM | POA: Diagnosis not present

## 2018-07-06 DIAGNOSIS — B9562 Methicillin resistant Staphylococcus aureus infection as the cause of diseases classified elsewhere: Secondary | ICD-10-CM | POA: Diagnosis present

## 2018-07-06 DIAGNOSIS — I10 Essential (primary) hypertension: Secondary | ICD-10-CM | POA: Diagnosis present

## 2018-07-06 DIAGNOSIS — Z87820 Personal history of traumatic brain injury: Secondary | ICD-10-CM | POA: Diagnosis not present

## 2018-07-06 DIAGNOSIS — L89152 Pressure ulcer of sacral region, stage 2: Secondary | ICD-10-CM | POA: Diagnosis not present

## 2018-07-06 DIAGNOSIS — Z930 Tracheostomy status: Secondary | ICD-10-CM | POA: Diagnosis not present

## 2018-07-06 DIAGNOSIS — I824Z9 Acute embolism and thrombosis of unspecified deep veins of unspecified distal lower extremity: Secondary | ICD-10-CM | POA: Diagnosis not present

## 2018-07-06 DIAGNOSIS — Z789 Other specified health status: Secondary | ICD-10-CM | POA: Diagnosis not present

## 2018-07-06 DIAGNOSIS — R338 Other retention of urine: Secondary | ICD-10-CM | POA: Diagnosis present

## 2018-07-06 DIAGNOSIS — N319 Neuromuscular dysfunction of bladder, unspecified: Secondary | ICD-10-CM | POA: Diagnosis present

## 2018-07-06 DIAGNOSIS — G061 Intraspinal abscess and granuloma: Secondary | ICD-10-CM | POA: Diagnosis present

## 2018-07-06 DIAGNOSIS — E876 Hypokalemia: Secondary | ICD-10-CM | POA: Diagnosis present

## 2018-07-06 DIAGNOSIS — J181 Lobar pneumonia, unspecified organism: Secondary | ICD-10-CM | POA: Diagnosis not present

## 2018-07-06 DIAGNOSIS — R7881 Bacteremia: Secondary | ICD-10-CM | POA: Diagnosis not present

## 2018-07-06 DIAGNOSIS — S14109S Unspecified injury at unspecified level of cervical spinal cord, sequela: Secondary | ICD-10-CM | POA: Diagnosis not present

## 2018-07-06 DIAGNOSIS — I469 Cardiac arrest, cause unspecified: Secondary | ICD-10-CM | POA: Diagnosis not present

## 2018-07-06 DIAGNOSIS — R4701 Aphasia: Secondary | ICD-10-CM | POA: Diagnosis present

## 2018-07-06 DIAGNOSIS — J189 Pneumonia, unspecified organism: Secondary | ICD-10-CM | POA: Diagnosis not present

## 2018-07-06 DIAGNOSIS — J9811 Atelectasis: Secondary | ICD-10-CM | POA: Diagnosis not present

## 2018-07-06 DIAGNOSIS — J9809 Other diseases of bronchus, not elsewhere classified: Secondary | ICD-10-CM | POA: Diagnosis not present

## 2018-07-06 DIAGNOSIS — J9601 Acute respiratory failure with hypoxia: Secondary | ICD-10-CM | POA: Diagnosis not present

## 2018-07-06 DIAGNOSIS — D649 Anemia, unspecified: Secondary | ICD-10-CM | POA: Diagnosis present

## 2018-07-06 LAB — FERRITIN: Ferritin: 851 ng/mL — ABNORMAL HIGH (ref 24–336)

## 2018-07-06 LAB — HEMOGLOBIN AND HEMATOCRIT, BLOOD
HCT: 29.1 % — ABNORMAL LOW (ref 39.0–52.0)
Hemoglobin: 9.3 g/dL — ABNORMAL LOW (ref 13.0–17.0)

## 2018-07-06 LAB — CBC
HEMATOCRIT: 24.6 % — AB (ref 39.0–52.0)
Hemoglobin: 7.7 g/dL — ABNORMAL LOW (ref 13.0–17.0)
MCH: 27.3 pg (ref 26.0–34.0)
MCHC: 31.3 g/dL (ref 30.0–36.0)
MCV: 87.2 fL (ref 80.0–100.0)
Platelets: 666 10*3/uL — ABNORMAL HIGH (ref 150–400)
RBC: 2.82 MIL/uL — ABNORMAL LOW (ref 4.22–5.81)
RDW: 16 % — ABNORMAL HIGH (ref 11.5–15.5)
WBC: 15.2 10*3/uL — ABNORMAL HIGH (ref 4.0–10.5)
nRBC: 0 % (ref 0.0–0.2)

## 2018-07-06 LAB — IRON AND TIBC
Iron: 16 ug/dL — ABNORMAL LOW (ref 45–182)
Saturation Ratios: 12 % — ABNORMAL LOW (ref 17.9–39.5)
TIBC: 132 ug/dL — ABNORMAL LOW (ref 250–450)
UIBC: 116 ug/dL

## 2018-07-06 LAB — BLOOD CULTURE ID PANEL (REFLEXED)
ACINETOBACTER BAUMANNII: NOT DETECTED
Candida albicans: NOT DETECTED
Candida glabrata: NOT DETECTED
Candida krusei: NOT DETECTED
Candida parapsilosis: NOT DETECTED
Candida tropicalis: NOT DETECTED
Enterobacter cloacae complex: NOT DETECTED
Enterobacteriaceae species: NOT DETECTED
Enterococcus species: NOT DETECTED
Escherichia coli: NOT DETECTED
Haemophilus influenzae: NOT DETECTED
Klebsiella oxytoca: NOT DETECTED
Klebsiella pneumoniae: NOT DETECTED
LISTERIA MONOCYTOGENES: NOT DETECTED
Methicillin resistance: DETECTED — AB
Neisseria meningitidis: NOT DETECTED
PSEUDOMONAS AERUGINOSA: NOT DETECTED
Proteus species: NOT DETECTED
STREPTOCOCCUS AGALACTIAE: NOT DETECTED
STREPTOCOCCUS SPECIES: NOT DETECTED
Serratia marcescens: NOT DETECTED
Staphylococcus aureus (BCID): DETECTED — AB
Staphylococcus species: DETECTED — AB
Streptococcus pneumoniae: NOT DETECTED
Streptococcus pyogenes: NOT DETECTED

## 2018-07-06 LAB — BODY FLUID CELL COUNT WITH DIFFERENTIAL
Eos, Fluid: 1 %
Lymphs, Fluid: 51 %
Monocyte-Macrophage-Serous Fluid: 10 % — ABNORMAL LOW (ref 50–90)
Neutrophil Count, Fluid: 38 % — ABNORMAL HIGH (ref 0–25)
Total Nucleated Cell Count, Fluid: 1755 cu mm — ABNORMAL HIGH (ref 0–1000)

## 2018-07-06 LAB — VITAMIN B12: VITAMIN B 12: 362 pg/mL (ref 180–914)

## 2018-07-06 LAB — GRAM STAIN

## 2018-07-06 LAB — ALBUMIN, PLEURAL OR PERITONEAL FLUID: Albumin, Fluid: 1.3 g/dL

## 2018-07-06 LAB — BASIC METABOLIC PANEL
Anion gap: 10 (ref 5–15)
BUN: 18 mg/dL (ref 8–23)
CO2: 24 mmol/L (ref 22–32)
Calcium: 8.6 mg/dL — ABNORMAL LOW (ref 8.9–10.3)
Chloride: 105 mmol/L (ref 98–111)
Creatinine, Ser: 0.83 mg/dL (ref 0.61–1.24)
GFR calc Af Amer: >60 mL/min (ref >60)
GFR calc non Af Amer: >60 mL/min (ref >60)
GLUCOSE: 99 mg/dL (ref 70–99)
Potassium: 3 mmol/L — ABNORMAL LOW (ref 3.5–5.1)
Sodium: 139 mmol/L (ref 135–145)

## 2018-07-06 LAB — GLUCOSE, PLEURAL OR PERITONEAL FLUID: Glucose, Fluid: 75 mg/dL

## 2018-07-06 LAB — MRSA PCR SCREENING: MRSA by PCR: POSITIVE — AB

## 2018-07-06 LAB — PROTEIN, PLEURAL OR PERITONEAL FLUID: Total protein, fluid: 3.9 g/dL

## 2018-07-06 LAB — LACTATE DEHYDROGENASE, PLEURAL OR PERITONEAL FLUID: LD, Fluid: 146 U/L — ABNORMAL HIGH (ref 3–23)

## 2018-07-06 LAB — FOLATE: Folate: 7.3 ng/mL (ref >5.9)

## 2018-07-06 MED ORDER — LIDOCAINE HCL (PF) 1 % IJ SOLN
INTRAMUSCULAR | Status: AC
  Administered 2018-07-06: 12:00:00
  Filled 2018-07-06: qty 30

## 2018-07-06 MED ORDER — MUPIROCIN 2 % EX OINT
1.0000 "application " | TOPICAL_OINTMENT | Freq: Two times a day (BID) | CUTANEOUS | Status: AC
  Administered 2018-07-06 – 2018-07-11 (×10): 1 via NASAL
  Filled 2018-07-06 (×2): qty 22

## 2018-07-06 MED ORDER — CYANOCOBALAMIN 1000 MCG/ML IJ SOLN
1000.0000 ug | Freq: Once | INTRAMUSCULAR | Status: AC
  Administered 2018-07-06: 1000 ug via SUBCUTANEOUS
  Filled 2018-07-06: qty 1

## 2018-07-06 MED ORDER — LORAZEPAM 2 MG/ML IJ SOLN: 0.5000 mg | INTRAMUSCULAR | Status: DC | PRN

## 2018-07-06 MED ORDER — TAMSULOSIN HCL 0.4 MG PO CAPS
0.4000 mg | ORAL_CAPSULE | Freq: Every day | ORAL | Status: DC
  Administered 2018-07-06 – 2018-07-25 (×19): 0.4 mg via ORAL
  Filled 2018-07-06 (×19): qty 1

## 2018-07-06 MED ORDER — ACETAMINOPHEN 325 MG PO TABS
650.0000 mg | ORAL_TABLET | Freq: Four times a day (QID) | ORAL | Status: DC | PRN
  Administered 2018-07-16: 650 mg via ORAL
  Filled 2018-07-06 (×3): qty 2

## 2018-07-06 MED ORDER — POLYETHYLENE GLYCOL 3350 17 G PO PACK: 17.0000 g | PACK | Freq: Every day | ORAL | Status: DC | PRN

## 2018-07-06 MED ORDER — CHLORHEXIDINE GLUCONATE CLOTH 2 % EX PADS
6.0000 | MEDICATED_PAD | Freq: Every day | CUTANEOUS | Status: AC
  Administered 2018-07-07 – 2018-07-09 (×3): 6 via TOPICAL

## 2018-07-06 MED ORDER — IOPAMIDOL (ISOVUE-370) INJECTION 76%
100.0000 mL | Freq: Once | INTRAVENOUS | Status: AC | PRN
  Administered 2018-07-06: 100 mL via INTRAVENOUS

## 2018-07-06 MED ORDER — POTASSIUM CHLORIDE CRYS ER 20 MEQ PO TBCR
40.0000 meq | EXTENDED_RELEASE_TABLET | ORAL | Status: AC
  Administered 2018-07-06 (×2): 40 meq via ORAL
  Filled 2018-07-06 (×2): qty 2

## 2018-07-06 MED ORDER — VANCOMYCIN HCL IN DEXTROSE 1-5 GM/200ML-% IV SOLN
1000.0000 mg | Freq: Two times a day (BID) | INTRAVENOUS | Status: DC
  Administered 2018-07-07: 1000 mg via INTRAVENOUS
  Filled 2018-07-06 (×3): qty 200

## 2018-07-06 MED ORDER — VANCOMYCIN HCL IN DEXTROSE 1-5 GM/200ML-% IV SOLN
1000.0000 mg | Freq: Two times a day (BID) | INTRAVENOUS | Status: DC
  Administered 2018-07-06: 1000 mg via INTRAVENOUS
  Filled 2018-07-06: qty 200

## 2018-07-06 MED ORDER — SODIUM CHLORIDE 0.9 % IV SOLN
INTRAVENOUS | Status: DC
  Administered 2018-07-06: 03:00:00 via INTRAVENOUS

## 2018-07-06 MED ORDER — APIXABAN 5 MG PO TABS
5.0000 mg | ORAL_TABLET | Freq: Two times a day (BID) | ORAL | Status: DC
  Administered 2018-07-06 (×2): 5 mg via ORAL
  Filled 2018-07-06 (×2): qty 1

## 2018-07-06 MED ORDER — FERROUS SULFATE 325 (65 FE) MG PO TABS
325.0000 mg | ORAL_TABLET | Freq: Two times a day (BID) | ORAL | Status: DC
  Administered 2018-07-06 – 2018-07-25 (×38): 325 mg via ORAL
  Filled 2018-07-06 (×38): qty 1

## 2018-07-06 MED ORDER — SODIUM CHLORIDE 0.9 % IV SOLN
2.0000 g | Freq: Two times a day (BID) | INTRAVENOUS | Status: DC
  Administered 2018-07-06: 2 g via INTRAVENOUS
  Filled 2018-07-06 (×2): qty 2

## 2018-07-06 MED ORDER — SODIUM CHLORIDE 0.9 % IV SOLN
INTRAVENOUS | Status: DC
  Administered 2018-07-06: 16:00:00 via INTRAVENOUS

## 2018-07-06 MED ORDER — IOPAMIDOL (ISOVUE-370) INJECTION 76%
INTRAVENOUS | Status: AC
  Filled 2018-07-06: qty 100

## 2018-07-06 MED ORDER — VITAMIN C 500 MG PO TABS
1000.0000 mg | ORAL_TABLET | Freq: Two times a day (BID) | ORAL | Status: DC
  Administered 2018-07-06: 1000 mg via ORAL
  Filled 2018-07-06: qty 2

## 2018-07-06 MED ORDER — SODIUM CHLORIDE 0.9 % IV SOLN
510.0000 mg | Freq: Once | INTRAVENOUS | Status: AC
  Administered 2018-07-06: 510 mg via INTRAVENOUS
  Filled 2018-07-06: qty 17

## 2018-07-06 NOTE — H&P (Signed)
History and Physical    [name]:[id number] DOB: 1950/11/14 DOA: 07/05/2018  PCP: Patient, No Pcp Per  Patient coming from: Nursing home  Chief Complaint: Abnormal lab low hemoglobin  HPI: Wayne Buckley is a 68 y.o. male with medical history significant of chronic anemia, history of TBI with resultant neurogenic bladder chronic indwelling Foley, DVT, hypertension sent in from nursing home for an abnormal lab today with a hemoglobin of less than 7.  He requires frequent transfusions for the same.  He has not had any overt bleeding no melena but no bright red blood per rectum patient is asymptomatic of this.  He has recently been started on Levaquin 2 days ago for pneumonia.  He denies feeling ill from the pneumonia.  He denies any urinary problems cough shortness of breath.  He denies any fevers however he was noted to have a fever here.  He is heme negative.  Patient referred for admission for fever and some mildly low blood pressures.  Patient has no complaints except he just wants to be able to sleep and is upset that we keep waking him up.  Review of Systems: As per HPI otherwise 10 point review of systems negative.   Past Medical History:  Diagnosis Date  . BPH (benign prostatic hyperplasia)   . DVT (deep venous thrombosis) (HCC) 02/2018   extensive LLE DVT  . H/O alcohol abuse   . Hypertension   . Neurogenic bowel   . Spinal cord injury, cervical region (HCC) 02/2018  . TBI (traumatic brain injury) (HCC) 2013   SDH with left partial frontal lobectomy    Past Surgical History:  Procedure Laterality Date  . ANTERIOR CERVICAL DECOMP/DISCECTOMY FUSION N/A 02/27/2018   Procedure: ANTERIOR CERVICAL DECOMPRESSION/DISCECTOMY FUSION C6-7;  Surgeon: Donalee Citrin, MD;  Location: [facility] OR;  Service: Neurosurgery;  Laterality: N/A;  . CRANIOTOMY  04/28/2012   Procedure: CRANIOTOMY HEMATOMA EVACUATION SUBDURAL;  Surgeon: Reinaldo Meeker, MD;  Location: [facility] OR;  Service: Neurosurgery;   Laterality: Left;  Left Craniotomy for Subdural Hematoma  . IR FLUORO GUIDE CV LINE RIGHT  05/14/2018  . IR US GUIDE VASC ACCESS RIGHT  05/14/2018  . RADIOLOGY WITH ANESTHESIA N/A 02/27/2018   Procedure: MRI WITH ANESTHESIA;  Surgeon: Radiologist, Medication, MD;  Location: [facility] OR;  Service: Radiology;  Laterality: N/A;  . VENA CAVA FILTER PLACEMENT Right 02/27/2018   Procedure: INSERTION VENA-CAVA FILTER;  Surgeon: Nada Libman, MD;  Location: [facility] OR;  Service: Vascular;  Laterality: Right;     reports that he has never smoked. He has never used smokeless tobacco. He reports current alcohol use. No history on file for drug.  No Known Allergies  Family History  Problem Relation Age of Onset  . Heart disease Father     Prior to Admission medications   Medication Sig Start Date End Date Taking? Authorizing Provider  acetaminophen (TYLENOL) 325 MG tablet Take 2 tablets (650 mg total) by mouth every 6 (six) hours as needed for mild pain. 04/03/18  Yes Angiulli, Mcarthur Rossetti, PA-C  amLODipine (NORVASC) 5 MG tablet Take 5 mg by mouth daily.   Yes [provider]  apixaban (ELIQUIS) 5 MG TABS tablet Take 1 tablet (5 mg total) by mouth 2 (two) times daily. 04/03/18  Yes Angiulli, Mcarthur Rossetti, PA-C  bisacodyl (DULCOLAX) 10 MG suppository Place 1 suppository (10 mg total) rectally daily at 6 (six) AM. Patient taking differently: Place 10 mg rectally daily as needed for mild constipation.  04/03/18  Yes Angiulli, Mcarthur Rossettianiel J, PA-C  carvedilol (COREG) 12.5 MG tablet Take 1 tablet (12.5 mg total) by mouth 2 (two) times daily with a meal. 04/03/18  Yes Angiulli, Mcarthur Rossettianiel J, PA-C  ferrous sulfate 325 (65 FE) MG tablet Take 1 tablet (325 mg total) by mouth 2 (two) times daily with a meal. 05/31/18  Yes Calvert Cantorizwan, Saima, MD  liver oil-zinc oxide (DESITIN) 40 % ointment Apply topically 4 (four) times daily. Apply to sacrum, coccyx, buttocks. Patient taking differently: Apply 1 application topically 4 (four)  times daily. Apply to sacrum, coccyx, buttocks. 05/14/18  Yes Rai, Ripudeep K, MD  polyethylene glycol (MIRALAX / GLYCOLAX) packet Take 17 g by mouth daily as needed for mild constipation or moderate constipation. 05/31/18  Yes Rizwan, Ladell HeadsSaima, MD  senna-docusate (SENOKOT-S) 8.6-50 MG tablet Take 1 tablet by mouth 2 (two) times daily. Patient taking differently: Take 1 tablet by mouth 2 (two) times daily as needed (constipation).  05/14/18  Yes Rai, Ripudeep K, MD  sodium phosphate (FLEET) 7-19 GM/118ML ENEM Place 133 mLs (1 enema total) rectally daily as needed for severe constipation. 05/31/18  Yes Calvert Cantorizwan, Saima, MD  tamsulosin (FLOMAX) 0.4 MG CAPS capsule Take 1 capsule (0.4 mg total) by mouth daily. 03/06/18  Yes Leroy SeaSingh, Prashant K, MD  vitamin C (VITAMIN C) 1000 MG tablet Take 1 tablet (1,000 mg total) by mouth 2 (two) times daily. 04/03/18  Yes Angiulli, Mcarthur Rossettianiel J, PA-C  amLODipine (NORVASC) 10 MG tablet Take 1 tablet (10 mg total) by mouth daily. Patient not taking: Reported on 07/05/2018 03/06/18   Leroy SeaSingh, Prashant K, MD  collagenase (SANTYL) ointment Apply topically daily. Cleanse gluteal wounds with NS.  Apply Santyl to open areas.  Cover with NS moist 2x2 and foam dressings.  Peel back foam and change daily. Foam is changed every three days and PRN soilage. Patient not taking: Reported on 07/05/2018 05/15/18   Rai, Delene Ruffiniipudeep K, MD  levofloxacin (LEVAQUIN) 750 MG tablet Take 750 mg by mouth daily. For 7 days. Start date 06/25/2018. End date 07/01/2018    [provider]  polyethylene glycol (MIRALAX / GLYCOLAX) packet Take 17 g by mouth 2 (two) times daily. Patient not taking: Reported on 07/05/2018 05/14/18   Cathren Harshai, Ripudeep K, MD    Physical Exam: Vitals:   07/05/18 2330 07/05/18 2345 07/06/18 0000 07/06/18 0015  BP: (!) 95/57 94/61 97/60  96/63  Pulse: 68 68 73 68  Resp: (!) 21 20 (!) 21 (!) 21  Temp:      TempSrc:      SpO2: 97% 91% 91% 90%  Weight:      Height:           Constitutional: NAD, calm, comfortable Vitals:   07/05/18 2330 07/05/18 2345 07/06/18 0000 07/06/18 0015  BP: (!) 95/57 94/61 97/60  96/63  Pulse: 68 68 73 68  Resp: (!) 21 20 (!) 21 (!) 21  Temp:      TempSrc:      SpO2: 97% 91% 91% 90%  Weight:      Height:       Eyes: PERRL, lids and conjunctivae normal ENMT: Mucous membranes are moist. Posterior pharynx clear of any exudate or lesions.Normal dentition.  Neck: normal, supple, no masses, no thyromegaly Respiratory: clear to auscultation bilaterally, no wheezing, no crackles. Normal respiratory effort. No accessory muscle use.  Cardiovascular: Regular rate and rhythm, no murmurs / rubs / gallops. No extremity edema. 2+ pedal pulses. No carotid bruits.  Abdomen: no tenderness,  no masses palpated. No hepatosplenomegaly. Bowel sounds positive.  Musculoskeletal: no clubbing / cyanosis. No joint deformity upper and lower extremities. Good ROM, no contractures. Normal muscle tone.  Skin: Decub per nursing notes  neurologic: CN 2-12 grossly intact.  Slurred speech chronic Psychiatric: Normal judgment and insight. Alert and oriented x 3. Normal mood.    Labs on Admission: I have personally reviewed following labs and imaging studies  CBC: Recent Labs  Lab 07/05/18 1918  WBC 18.4*  NEUTROABS 15.8*  HGB 7.2*  HCT 23.3*  MCV 87.6  PLT 776*   Basic Metabolic Panel: Recent Labs  Lab 07/05/18 1918  NA 138  K 3.6  CL 103  CO2 23  GLUCOSE 111*  BUN 22  CREATININE 0.90  CALCIUM 9.3   GFR: Estimated Creatinine Clearance: 98.1 mL/min (by C-G formula based on SCr of 0.9 mg/dL). Liver Function Tests: Recent Labs  Lab 07/05/18 1918  AST 26  ALT 11  ALKPHOS 96  BILITOT 0.7  PROT 6.3*  ALBUMIN 1.8*   No results for input(s): LIPASE, AMYLASE in the last 168 hours. No results for input(s): AMMONIA in the last 168 hours. Coagulation Profile: Recent Labs  Lab 07/05/18 1918  INR 1.89   Cardiac Enzymes: No results  for input(s): CKTOTAL, CKMB, CKMBINDEX, TROPONINI in the last 168 hours. BNP (last 3 results) No results for input(s): PROBNP in the last 8760 hours. HbA1C: No results for input(s): HGBA1C in the last 72 hours. CBG: No results for input(s): GLUCAP in the last 168 hours. Lipid Profile: No results for input(s): CHOL, HDL, LDLCALC, TRIG, CHOLHDL, LDLDIRECT in the last 72 hours. Thyroid Function Tests: No results for input(s): TSH, T4TOTAL, FREET4, T3FREE, THYROIDAB in the last 72 hours. Anemia Panel: Recent Labs    07/05/18 1918  RETICCTPCT 3.0   Urine analysis:    Component Value Date/Time   COLORURINE YELLOW 07/05/2018 2058   APPEARANCEUR TURBID (A) 07/05/2018 2058   LABSPEC 1.012 07/05/2018 2058   PHURINE 5.0 07/05/2018 2058   GLUCOSEU NEGATIVE 07/05/2018 2058   HGBUR LARGE (A) 07/05/2018 2058   BILIRUBINUR NEGATIVE 07/05/2018 2058   KETONESUR NEGATIVE 07/05/2018 2058   PROTEINUR NEGATIVE 07/05/2018 2058   UROBILINOGEN 0.2 05/08/2012 0314   NITRITE NEGATIVE 07/05/2018 2058   LEUKOCYTESUR MODERATE (A) 07/05/2018 2058   Sepsis Labs: !!!!!!!!!!!!!!!!!!!!!!!!!!!!!!!!!!!!!!!!!!!! @LABRCNTIP (procalcitonin:4,lacticidven:4) )No results found for this or any previous visit (from the past 240 hour(s)).   Radiological Exams on Admission: Dg Chest 2 View  Result Date: 07/05/2018 CLINICAL DATA:  68 y/o  M; anemia. Fever. EXAM: CHEST - 2 VIEW COMPARISON:  05/02/2018 chest radiograph. FINDINGS: Large left pleural effusion. Left mid and lower lung zone opacities. Cardiac silhouette partially obscured by the effusion. Aortic calcific atherosclerosis. ACDF hardware noted. Clear right lung. No pneumothorax. Bones are unremarkable. IVC filter in situ. IMPRESSION: Large left pleural effusion. Left mid and lower lung zone opacities may represent associated atelectasis or pneumonia. Electronically Signed   By: Mitzi HansenLance  Furusawa-Stratton M.D.   On: 07/05/2018 22:26   Old chart reviewed Case  discussed with Dr. Jacqulyn BathLong in the emergency department Chest x-ray reviewed left pleural effusion  Assessment/Plan 68 year old male sent in for asymptomatic anemia found to have a fever recently started on Levaquin for pneumonia Principal Problem:   Fever-fever with some soft blood pressures.  Responding to blood transfusion.  Patient's been started on vancomycin cefepime and Flagyl.  Possibly also has a UTI.  Recently diagnosed with pneumonia we will hold Levaquin.  Lactic acid  level normal.  Follow-up on blood cultures.  Influenza negative.  Active Problems:   Urinary retention-noted chronic follow-up on urine culture    C5-C7 level spinal cord injury (HCC)-stable    Essential hypertension-hold blood pressure medications at this time secondary to initial episodes of hypotension which have resolved with fluid resuscitation    Chronic anemia-transfused 2 units in the ED.  Hemoglobin 7.2 here.  Baseline not far from this chronically.  No overt bleeding.    Traumatic brain injury (HCC)-noted  History of extensive DVT-continue anticoagulation at this time secondary to no obvious evidence of bleeding.  Monitor for any such things to develop.   DVT prophylaxis: Eliquis Code Status: Full Family Communication: None Disposition Plan: 1 to 2 days Consults called: None Admission status: Admission   Ekam Bonebrake A MD [facility]  If 7PM-7AM, please contact night-coverage [email or web address] Password [email or web address]  07/06/2018, 12:40 AM

## 2018-07-06 NOTE — Progress Notes (Addendum)
Pharmacy Antibiotic Note  Wayne Buckley is a 68 y.o. male admitted on 07/05/2018 with low Hgb.  Pharmacy has been consulted for Vancomycin/Cefepime dosing for fever, PNA as possible source. WBC is elevated. Renal function good.   Plan: Vancomycin 2000 mg IV x 1, then give 1000 mg IV q12h >>Estimated AUC: 500 Cefepime 2g IV q12h Flagyl per MD Trend WBC, temp, renal function  F/U infectious work-up Drug levels as indicated   Height: 5\' 9"  (175.3 cm) Weight: 253 lb 1.4 oz (114.8 kg) IBW/kg (Calculated) : 70.7  Temp (24hrs), Avg:100 F (37.8 C), Min:99.5 F (37.5 C), Max:101 F (38.3 C)  Recent Labs  Lab 07/05/18 1918 07/05/18 2059  WBC 18.4*  --   CREATININE 0.90  --   LATICACIDVEN  --  0.77    Estimated Creatinine Clearance: 98.1 mL/min (by C-G formula based on SCr of 0.9 mg/dL).    No Known Allergies   Wayne, Buckley 07/06/2018 1:16 AM

## 2018-07-06 NOTE — Progress Notes (Signed)
[facility] PLAN OF CARE NOTE Patient: Wayne Buckley SHF:[id number]   PCP: Patient, No Pcp Per DOB: [birth year]   DOA: 07/05/2018   DOS: 07/06/2018    Patient was admitted by my colleague Dr. Onalee Hua earlier on 07/06/2018. I have reviewed the H&P as well as assessment and plan and agree with the same. Important changes in the plan are listed below.  Plan of care: Principal Problem:   Fever Active Problems:   Urinary retention   C5-C7 level spinal cord injury (HCC)   Essential hypertension   Chronic anemia   Traumatic brain injury (HCC)   Left lobar pneumonia. Left pleural effusion. We will recommend ultrasound-guided thoracentesis.  Iron deficiency anemia. Provide IV iron since the patient is on oral iron and unable to maintain adequate hemoglobin. We will recommend outpatient IV iron as well.  Patient will benefit from cardiology consultation outpatient.  Abdominal distention. [place] syndrome. Continue laxative and enema.   Author: Lynden Oxford, MD [facility] 07/06/2018 12:14 PM   If 7PM-7AM, please contact night-coverage at [email or web address]

## 2018-07-06 NOTE — Progress Notes (Signed)
PHARMACY - PHYSICIAN COMMUNICATION CRITICAL VALUE ALERT - BLOOD CULTURE IDENTIFICATION (BCID)  Wayne Buckley is an 68 y.o. male who presented to [facility] on 07/05/2018 with a chief complaint of sepsis  Assessment: Pt presented from SNF with fever and low BP. He has a hx of TBI with neurogenic bladder. Labs called tonight with BCID result of 1/4 bottles positive for MRSA. CT of spine showed likely diskitis and osteomyelitis. He was started on vanc/cefepime. Message Dr. Allena Katz about narrowing abx to just vanc.   Name of physician (or Provider) Contacted: Dr. Allena Katz  Current antibiotics: Vanc/cefepime  Changes to prescribed antibiotics recommended:  Dc cefepime. Cont Vanc  Results for orders placed or performed during the hospital encounter of 05/02/18  Blood Culture ID Panel (Reflexed) (Collected: 05/02/2018  2:03 PM)  Result Value Ref Range   Enterococcus species NOT DETECTED NOT DETECTED   Listeria monocytogenes NOT DETECTED NOT DETECTED   Staphylococcus species DETECTED (A) NOT DETECTED   Staphylococcus aureus (BCID) DETECTED (A) NOT DETECTED   Methicillin resistance DETECTED (A) NOT DETECTED   Streptococcus species NOT DETECTED NOT DETECTED   Streptococcus agalactiae NOT DETECTED NOT DETECTED   Streptococcus pneumoniae NOT DETECTED NOT DETECTED   Streptococcus pyogenes NOT DETECTED NOT DETECTED   Acinetobacter baumannii NOT DETECTED NOT DETECTED   Enterobacteriaceae species NOT DETECTED NOT DETECTED   Enterobacter cloacae complex NOT DETECTED NOT DETECTED   Escherichia coli NOT DETECTED NOT DETECTED   Klebsiella oxytoca NOT DETECTED NOT DETECTED   Klebsiella pneumoniae NOT DETECTED NOT DETECTED   Proteus species NOT DETECTED NOT DETECTED   Serratia marcescens NOT DETECTED NOT DETECTED   Haemophilus influenzae NOT DETECTED NOT DETECTED   Neisseria meningitidis NOT DETECTED NOT DETECTED   Pseudomonas aeruginosa NOT DETECTED NOT DETECTED   Candida albicans NOT DETECTED NOT DETECTED    Candida glabrata NOT DETECTED NOT DETECTED   Candida krusei NOT DETECTED NOT DETECTED   Candida parapsilosis NOT DETECTED NOT DETECTED   Candida tropicalis NOT DETECTED NOT DETECTED   Ulyses Southward, PharmD, BCIDP, AAHIVP, CPP Infectious Disease Pharmacist 07/06/2018 6:05 PM

## 2018-07-06 NOTE — Procedures (Signed)
PROCEDURE SUMMARY:  Successful image-guided left thoracentesis. Yielded 90 milliliters of serosanguineous fluid - pleural effusion is highly loculated on US examination, unable to remove any more fluid after multiple attempts at catheter repositioning. Consider CT for further evaluation of pleural effusion if needed.   Patient tolerated procedure well. EBL: Zero No immediate complications.  Specimen was sent for labs. Post procedure CXR shows no pneumothorax.  Please see imaging section of Epic for full dictation.  Villa Herb PA-C 07/06/2018 11:55 AM

## 2018-07-07 DIAGNOSIS — M4645 Discitis, unspecified, thoracolumbar region: Secondary | ICD-10-CM

## 2018-07-07 DIAGNOSIS — R7881 Bacteremia: Secondary | ICD-10-CM

## 2018-07-07 DIAGNOSIS — F1021 Alcohol dependence, in remission: Secondary | ICD-10-CM

## 2018-07-07 DIAGNOSIS — Z8614 Personal history of Methicillin resistant Staphylococcus aureus infection: Secondary | ICD-10-CM

## 2018-07-07 DIAGNOSIS — J918 Pleural effusion in other conditions classified elsewhere: Secondary | ICD-10-CM

## 2018-07-07 DIAGNOSIS — Z87448 Personal history of other diseases of urinary system: Secondary | ICD-10-CM

## 2018-07-07 DIAGNOSIS — J181 Lobar pneumonia, unspecified organism: Principal | ICD-10-CM

## 2018-07-07 DIAGNOSIS — Z87311 Personal history of (healed) other pathological fracture: Secondary | ICD-10-CM

## 2018-07-07 DIAGNOSIS — G3184 Mild cognitive impairment, so stated: Secondary | ICD-10-CM

## 2018-07-07 DIAGNOSIS — Z87820 Personal history of traumatic brain injury: Secondary | ICD-10-CM

## 2018-07-07 DIAGNOSIS — Z981 Arthrodesis status: Secondary | ICD-10-CM

## 2018-07-07 DIAGNOSIS — N319 Neuromuscular dysfunction of bladder, unspecified: Secondary | ICD-10-CM

## 2018-07-07 DIAGNOSIS — B9562 Methicillin resistant Staphylococcus aureus infection as the cause of diseases classified elsewhere: Secondary | ICD-10-CM

## 2018-07-07 DIAGNOSIS — J90 Pleural effusion, not elsewhere classified: Secondary | ICD-10-CM

## 2018-07-07 LAB — BPAM RBC
Blood Product Expiration Date: 202002202359
Blood Product Expiration Date: 202002202359
ISSUE DATE / TIME: 202001172128
ISSUE DATE / TIME: 202001180237
UNIT TYPE AND RH: 5100
Unit Type and Rh: 5100

## 2018-07-07 LAB — CBC WITH DIFFERENTIAL/PLATELET
Abs Immature Granulocytes: 0.24 10*3/uL — ABNORMAL HIGH (ref 0.00–0.07)
Basophils Absolute: 0 10*3/uL (ref 0.0–0.1)
Basophils Relative: 0 %
Eosinophils Absolute: 0.1 10*3/uL (ref 0.0–0.5)
Eosinophils Relative: 1 %
HCT: 29.9 % — ABNORMAL LOW (ref 39.0–52.0)
Hemoglobin: 9.3 g/dL — ABNORMAL LOW (ref 13.0–17.0)
Immature Granulocytes: 2 %
Lymphocytes Relative: 10 %
Lymphs Abs: 1.4 10*3/uL (ref 0.7–4.0)
MCH: 26.6 pg (ref 26.0–34.0)
MCHC: 31.1 g/dL (ref 30.0–36.0)
MCV: 85.4 fL (ref 80.0–100.0)
MONO ABS: 0.9 10*3/uL (ref 0.1–1.0)
Monocytes Relative: 6 %
NEUTROS ABS: 11.5 10*3/uL — AB (ref 1.7–7.7)
Neutrophils Relative %: 81 %
Platelets: 641 10*3/uL — ABNORMAL HIGH (ref 150–400)
RBC: 3.5 MIL/uL — ABNORMAL LOW (ref 4.22–5.81)
RDW: 16.1 % — ABNORMAL HIGH (ref 11.5–15.5)
WBC: 14.2 10*3/uL — ABNORMAL HIGH (ref 4.0–10.5)
nRBC: 0 % (ref 0.0–0.2)

## 2018-07-07 LAB — TYPE AND SCREEN
ABO/RH(D): O POS
Antibody Screen: NEGATIVE
Unit division: 0
Unit division: 0

## 2018-07-07 LAB — COMPREHENSIVE METABOLIC PANEL
ALT: 9 U/L (ref 0–44)
AST: 19 U/L (ref 15–41)
Albumin: 1.4 g/dL — ABNORMAL LOW (ref 3.5–5.0)
Alkaline Phosphatase: 97 U/L (ref 38–126)
Anion gap: 9 (ref 5–15)
BUN: 10 mg/dL (ref 8–23)
CO2: 22 mmol/L (ref 22–32)
Calcium: 8.4 mg/dL — ABNORMAL LOW (ref 8.9–10.3)
Chloride: 106 mmol/L (ref 98–111)
Creatinine, Ser: 0.65 mg/dL (ref 0.61–1.24)
GFR calc non Af Amer: >60 mL/min (ref >60)
Glucose, Bld: 96 mg/dL (ref 70–99)
Potassium: 3.7 mmol/L (ref 3.5–5.1)
Sodium: 137 mmol/L (ref 135–145)
Total Bilirubin: 0.4 mg/dL (ref 0.3–1.2)
Total Protein: 5.5 g/dL — ABNORMAL LOW (ref 6.5–8.1)

## 2018-07-07 LAB — TRIGLYCERIDES, BODY FLUIDS: Triglycerides, Fluid: 27 mg/dL

## 2018-07-07 LAB — URINE CULTURE
Culture: NO GROWTH
Special Requests: NORMAL

## 2018-07-07 LAB — VANCOMYCIN, TROUGH: Vancomycin Tr: 21 ug/mL (ref 15–20)

## 2018-07-07 LAB — VANCOMYCIN, PEAK: Vancomycin Pk: 15 ug/mL — ABNORMAL LOW (ref 30–40)

## 2018-07-07 LAB — MAGNESIUM: Magnesium: 1.7 mg/dL (ref 1.7–2.4)

## 2018-07-07 LAB — LACTATE DEHYDROGENASE: LDH: 95 U/L — ABNORMAL LOW (ref 98–192)

## 2018-07-07 MED ORDER — VANCOMYCIN HCL IN DEXTROSE 750-5 MG/150ML-% IV SOLN
750.0000 mg | Freq: Two times a day (BID) | INTRAVENOUS | Status: DC
  Administered 2018-07-08 – 2018-07-11 (×6): 750 mg via INTRAVENOUS
  Filled 2018-07-07 (×7): qty 150

## 2018-07-07 NOTE — Consult Note (Signed)
NAME:  Wayne Buckley, MRN:  [id number], DOB:  [birth year], LOS: 1 ADMISSION DATE:  07/05/2018, CONSULTATION DATE:  1/19 REFERRING MD:  Dr. Lynden Oxford [facility]), CHIEF COMPLAINT:  Pleural Effusion   Brief History   68 yo M recent admission for MRSA bacteremia sent to [facility] ED from rehab facility for acute on chronic anemia requiring transfusion. Found to have left sided pneumonia with parapneumonic effusion. Blood cultures on admission grew MRSA bacteremia. Underwent US guided thoracentesis 1/18 that yielded 90 cc of fluid. Unable to aspiration additional fluid due to loculations. CT Chest with large loculated effusion and evidence of T11-T12 and T12-L1 diskitis / osteomyelitis.   History of present illness   Patient is a 68 yo M recently discharged from the hospital on 12/13 after admission for sepsis due to MRSA bacteremia & UTI in the setting of chronic foley catheter (CAUTI). Repeat cultures prior to discharge were negative and echocardiogram showed no evidence of vegetations. Unfortunately TEE was unsuccessful due to ?obstruciton. Initially on vancomycin but change due to daptomycin per ID due to worsening renal dysfunction. He was discharged to [facility] 1/13 but readmitted 1/18 due to anemia requiring transfusion. He has chronic anemia requiring frequent transfusions. He is on eliquis for hx of DVT but has had no overt bleeding. He was febrile on arrival to 101F and hypotensive 95/57. Found to have a left sided PNA and large parapneumonic effusion on CXR. Blood cultures on admission grew MRSA.   Past Medical History  HTN, chronic anemia requiring recurrent transfusion (no clinical bleeding), history of TBI with neurogenic bladder, chronic indwelling foley, and DVT  Significant Hospital Events   1/17 > Re admit from SNF (recent discharge for MRSA bacteremia)   Consults:  PCCM   Procedures:  1/18 US guided thoracentesis   Significant Diagnostic Tests:  1/18 CTA Chest >> negative for PE. Large  loculated left pleural effusion with upper and lower lobe consolidation, T11-T12 and T12-L1 changes concerning for diskitis / osteomyelitis   1/19 MRI Spine >> pending   Micro Data:  1/17 Blood cx >> MRSA 1/17 Urine cx >> No growth (final)  1/18 Thoracentesis fluid >> Gram stain no organisms, culture pending   Antimicrobials:  1/17 Cefepime x 1 1/17 Metronidazole x 1 1/17 Vancomycin >>   Interim history/subjective:  A&Ox3, laying in bed on room air, appears comfortable.   Objective   Blood pressure 131/87, pulse 79, temperature 98.1 F (36.7 C), temperature source Oral, resp. rate 20, height 5\' 9"  (1.753 m), weight 79.1 kg, SpO2 93 %.        Intake/Output Summary (Last 24 hours) at 07/07/2018 1140 Last data filed at 07/07/2018 0645 Gross per 24 hour  Intake 1508.28 ml  Output 2200 ml  Net -691.72 ml   Filed Weights   07/05/18 1900 07/06/18 0139  Weight: 114.8 kg 79.1 kg    Examination: General: laying in bed, NAD HENT: PERRL, NCAT Lungs: Diminished / absent breath sounds on left, faint bibasilar crackles Cardiovascular: RRR, no m/r/g Abdomen: Soft, non tender, non distended  Extremities: Warm, no edema Neuro: Moving all extremities, followings commands, some limb ataxia   Resolved Hospital Problem list     Assessment & Plan:   Left Upper and Lower Lobe PNA with loculated pleural effusion: US guided thoracentesis yielded 90 cc of fluid unfortunately limited due to loculations.  -- Stable on RA -- Continue vancomycin -- Chest tube vs. VATS for source control -- F/u pleural fluid cultures   MRSA Bacteremia  T11-T12, T12-L1 Diskitis / osteomyelitis  -- ID following -- Continue vancomycin  -- Recommend holding iron supplementation in the setting of bacteremia  -- Repeat echo pending, r/o endocarditis. Prior TEE unsuccessful due prior cervical fusion    Best practice:  Diet: heart healthy  Pain/Anxiety/Delirium protocol (if indicated): N/A VAP protocol (if  indicated): N/A DVT prophylaxis: Eliquis GI prophylaxis: N/A Glucose control: monitor Mobility: as toleraged Code Status: FULL Family Communication: none at bedside Disposition: remain on floor   Labs   CBC: Recent Labs  Lab 07/05/18 1918 07/06/18 0218 07/06/18 0737 07/07/18 0835  WBC 18.4* 15.2*  --  14.2*  NEUTROABS 15.8*  --   --  11.5*  HGB 7.2* 7.7* 9.3* 9.3*  HCT 23.3* 24.6* 29.1* 29.9*  MCV 87.6 87.2  --  85.4  PLT 776* 666*  --  641*    Basic Metabolic Panel: Recent Labs  Lab 07/05/18 1918 07/06/18 0218 07/07/18 0835  NA 138 139 137  K 3.6 3.0* 3.7  CL 103 105 106  CO2 23 24 22   GLUCOSE 111* 99 96  BUN 22 18 10   CREATININE 0.90 0.83 0.65  CALCIUM 9.3 8.6* 8.4*  MG  --   --  1.7   GFR: Estimated Creatinine Clearance: 88.4 mL/min (by C-G formula based on SCr of 0.65 mg/dL). Recent Labs  Lab 07/05/18 1918 07/05/18 2059 07/06/18 0218 07/07/18 0835  WBC 18.4*  --  15.2* 14.2*  LATICACIDVEN  --  0.77  --   --     Liver Function Tests: Recent Labs  Lab 07/05/18 1918 07/07/18 0835  AST 26 19  ALT 11 9  ALKPHOS 96 97  BILITOT 0.7 0.4  PROT 6.3* 5.5*  ALBUMIN 1.8* 1.4*   No results for input(s): LIPASE, AMYLASE in the last 168 hours. No results for input(s): AMMONIA in the last 168 hours.  ABG    Component Value Date/Time   PHART 7.429 04/29/2012 0453   PCO2ART 33.0 (L) 04/29/2012 0453   PO2ART 138.0 (H) 04/29/2012 0453   HCO3 21.5 04/29/2012 0453   TCO2 22.5 04/29/2012 0453   ACIDBASEDEF 2.2 (H) 04/29/2012 0453   O2SAT 99.3 04/29/2012 0453     Coagulation Profile: Recent Labs  Lab 07/05/18 1918  INR 1.89    Cardiac Enzymes: No results for input(s): CKTOTAL, CKMB, CKMBINDEX, TROPONINI in the last 168 hours.  HbA1C: No results found for: HGBA1C  CBG: No results for input(s): GLUCAP in the last 168 hours.  Review of Systems:   Per H&P  Past Medical History  He,  has a past medical history of BPH (benign prostatic  hyperplasia), DVT (deep venous thrombosis) (HCC) (02/2018), H/O alcohol abuse, Hypertension, Neurogenic bowel, Spinal cord injury, cervical region [facility]) (02/2018), and TBI (traumatic brain injury) (HCC) (2013).   Surgical History    Past Surgical History:  Procedure Laterality Date  . ANTERIOR CERVICAL DECOMP/DISCECTOMY FUSION N/A 02/27/2018   Procedure: ANTERIOR CERVICAL DECOMPRESSION/DISCECTOMY FUSION C6-7;  Surgeon: Donalee Citrin, MD;  Location: [facility] OR;  Service: Neurosurgery;  Laterality: N/A;  . CRANIOTOMY  04/28/2012   Procedure: CRANIOTOMY HEMATOMA EVACUATION SUBDURAL;  Surgeon: Reinaldo Meeker, MD;  Location: [facility] OR;  Service: Neurosurgery;  Laterality: Left;  Left Craniotomy for Subdural Hematoma  . IR FLUORO GUIDE CV LINE RIGHT  05/14/2018  . IR US GUIDE VASC ACCESS RIGHT  05/14/2018  . RADIOLOGY WITH ANESTHESIA N/A 02/27/2018   Procedure: MRI WITH ANESTHESIA;  Surgeon: Radiologist, Medication, MD;  Location: [facility] OR;  Service: Radiology;  Laterality: N/A;  . VENA CAVA FILTER PLACEMENT Right 02/27/2018   Procedure: INSERTION VENA-CAVA FILTER;  Surgeon: Nada Libman, MD;  Location: [facility] OR;  Service: Vascular;  Laterality: Right;     Social History   reports that he has never smoked. He has never used smokeless tobacco. He reports current alcohol use.   Family History   His family history includes Heart disease in his father.   Allergies No Known Allergies   Home Medications  Prior to Admission medications   Medication Sig Start Date End Date Taking? Authorizing Provider  acetaminophen (TYLENOL) 325 MG tablet Take 2 tablets (650 mg total) by mouth every 6 (six) hours as needed for mild pain. 04/03/18  Yes Angiulli, Mcarthur Rossetti, PA-C  amLODipine (NORVASC) 5 MG tablet Take 5 mg by mouth daily.   Yes [provider]  apixaban (ELIQUIS) 5 MG TABS tablet Take 1 tablet (5 mg total) by mouth 2 (two) times daily. 04/03/18  Yes Angiulli, Mcarthur Rossetti, PA-C  bisacodyl (DULCOLAX) 10 MG  suppository Place 1 suppository (10 mg total) rectally daily at 6 (six) AM. Patient taking differently: Place 10 mg rectally daily as needed for mild constipation.  04/03/18  Yes Angiulli, Mcarthur Rossetti, PA-C  carvedilol (COREG) 12.5 MG tablet Take 1 tablet (12.5 mg total) by mouth 2 (two) times daily with a meal. 04/03/18  Yes Angiulli, Mcarthur Rossetti, PA-C  ferrous sulfate 325 (65 FE) MG tablet Take 1 tablet (325 mg total) by mouth 2 (two) times daily with a meal. 05/31/18  Yes Calvert Cantor, MD  liver oil-zinc oxide (DESITIN) 40 % ointment Apply topically 4 (four) times daily. Apply to sacrum, coccyx, buttocks. Patient taking differently: Apply 1 application topically 4 (four) times daily. Apply to sacrum, coccyx, buttocks. 05/14/18  Yes Rai, Ripudeep K, MD  polyethylene glycol (MIRALAX / GLYCOLAX) packet Take 17 g by mouth daily as needed for mild constipation or moderate constipation. 05/31/18  Yes Rizwan, Ladell Heads, MD  senna-docusate (SENOKOT-S) 8.6-50 MG tablet Take 1 tablet by mouth 2 (two) times daily. Patient taking differently: Take 1 tablet by mouth 2 (two) times daily as needed (constipation).  05/14/18  Yes Rai, Ripudeep K, MD  sodium phosphate (FLEET) 7-19 GM/118ML ENEM Place 133 mLs (1 enema total) rectally daily as needed for severe constipation. 05/31/18  Yes Calvert Cantor, MD  tamsulosin (FLOMAX) 0.4 MG CAPS capsule Take 1 capsule (0.4 mg total) by mouth daily. 03/06/18  Yes Leroy Sea, MD  vitamin C (VITAMIN C) 1000 MG tablet Take 1 tablet (1,000 mg total) by mouth 2 (two) times daily. 04/03/18  Yes Angiulli, Mcarthur Rossetti, PA-C  amLODipine (NORVASC) 10 MG tablet Take 1 tablet (10 mg total) by mouth daily. Patient not taking: Reported on 07/05/2018 03/06/18   Leroy Sea, MD  collagenase (SANTYL) ointment Apply topically daily. Cleanse gluteal wounds with NS.  Apply Santyl to open areas.  Cover with NS moist 2x2 and foam dressings.  Peel back foam and change daily. Foam is changed every  three days and PRN soilage. Patient not taking: Reported on 07/05/2018 05/15/18   Rai, Delene Ruffini, MD  levofloxacin (LEVAQUIN) 750 MG tablet Take 750 mg by mouth daily. For 7 days. Start date 06/25/2018. End date 07/01/2018    [provider]  polyethylene glycol (MIRALAX / GLYCOLAX) packet Take 17 g by mouth 2 (two) times daily. Patient not taking: Reported on 07/05/2018 05/14/18   Cathren Harsh, MD  Critical care time:     Reymundo Pollarolyn Sylvan Sookdeo, M.D. - PGY3 Pager: [phone] 07/07/2018, 12:31 PM

## 2018-07-07 NOTE — Progress Notes (Signed)
[facility] Progress Note  Patient: Wayne Buckley PVV:[id number]   PCP: Patient, No Pcp Per DOB: 01/12/1951   DOA: 07/05/2018   DOS: 07/07/2018   Date of Service: the patient was seen and examined on 07/07/2018  Brief hospital course: Pt. with PMH of MRSA bacteremia, TBI, neurogenic bladder chronic indwelling Foley catheter, prior DVT, anemia; admitted on 07/05/2018, presented with complaint of fatigue and abnormal lab, was found to have symptomatic anemia as well as MRSA bacteremia. Currently further plan is continue IV antibiotics.  Subjective: Stressed out with a new diagnosis, no nausea no vomiting no fever no chills no chest pain.  Reports that he is actually feeling much better.  No diarrhea.  Telemetry: Sinus tachycardia  Assessment and Plan: Recurrent MRSA bacteremia Left-sided pneumonia with loculated left effusion Osteomyelitis of thoracic vertebra Chronic indwelling Foley catheter Treated in November, completed treatment in December. Echocardiogram at that time showed no vegetation. Now comes to the hospital with abnormal lab and fever and now found to have positive blood cultures with loculated effusion as well as osteomyelitis of the vertebra. Repeat cultures are ordered. Echocardiogram repeat one ordered as well. Appreciate ID and PCCM consultation. Underwent IR guided thoracentesis but it was not able to remove all the fluid. May require CT-guided drain placement followed by TPA placement. We will get MRIs spine of all levels to rule out any acute abnormality. General anesthesia consulted. -On last admission TEE was unsuccessful due to ?obstruction, esophagogram showed no stricture Continue antibiotics  Abdominal distension/ ileus- Ogilvie's ? Continue stool softener and as needed Dulcolax suppository as well as enema.  Hypokalemia - replaced  H/o TBI, cord compression - lives in SNF - chronic foley for neurogenic bladder  Essential hypertension -BP  stable, continue amlodipine and Coreg  Iron deficiency anemia and probable anemia of chronic illness - cont Ferrous Sulfate, also given IV iron.  Will require IV iron outpatient as well. - transfused 3 U PRBC, IV iron provided as well.  History of DVT (deep vein thrombosis) - Eliquis  Unstageable pressure injury of bilateral buttocks POA.  Pressure Injury 05/03/18 Unstageable - Full thickness tissue loss in which the base of the ulcer is covered by slough (yellow, tan, gray, green or brown) and/or eschar (tan, brown or black) in the wound bed. (Active)  05/03/18 0200  Location: Buttocks  Location Orientation: Left;Right  Staging: Unstageable - Full thickness tissue loss in which the base of the ulcer is covered by slough (yellow, tan, gray, green or brown) and/or eschar (tan, brown or black) in the wound bed.  Wound Description (Comments):   Present on Admission: Yes   Diet: Cardiac diet DVT Prophylaxis: on therapeutic anticoagulation.  Advance goals of care discussion: Full code  Family Communication: family was present at bedside, at the time of interview. The pt provided permission to discuss medical plan with the family. Opportunity was given to ask question and all questions were answered satisfactorily.   Disposition:  Discharge to SNF.  Consultants: PCCM ID Procedures: Echocardiogram   Scheduled Meds: . Chlorhexidine Gluconate Cloth  6 each Topical Q0600  . ferrous sulfate  325 mg Oral BID WC  . mupirocin ointment  1 application Nasal BID  . tamsulosin  0.4 mg Oral Daily   Continuous Infusions: . [START ON 07/08/2018] vancomycin     PRN Meds: acetaminophen, polyethylene glycol Antibiotics: Anti-infectives (From admission, onward)   Start     Dose/Rate Route Frequency Ordered Stop   07/08/18 1800  vancomycin (VANCOCIN) IVPB  750 mg/150 ml premix     750 mg 150 mL/hr over 60 Minutes Intravenous Every 12 hours 07/07/18 1219     07/06/18 1200  vancomycin  (VANCOCIN) IVPB 1000 mg/200 mL premix  Status:  Discontinued     1,000 mg 200 mL/hr over 60 Minutes Intravenous Every 12 hours 07/06/18 1014 07/07/18 1219   07/06/18 1000  vancomycin (VANCOCIN) IVPB 1000 mg/200 mL premix  Status:  Discontinued     1,000 mg 200 mL/hr over 60 Minutes Intravenous Every 12 hours 07/06/18 0122 07/06/18 1006   07/06/18 1000  ceFEPIme (MAXIPIME) 2 g in sodium chloride 0.9 % 100 mL IVPB  Status:  Discontinued     2 g 200 mL/hr over 30 Minutes Intravenous Every 12 hours 07/06/18 0122 07/06/18 1756   07/05/18 2300  ceFEPIme (MAXIPIME) 2 g in sodium chloride 0.9 % 100 mL IVPB     2 g 200 mL/hr over 30 Minutes Intravenous  Once 07/05/18 2245 07/06/18 0014   07/05/18 2300  metroNIDAZOLE (FLAGYL) IVPB 500 mg  Status:  Discontinued     500 mg 100 mL/hr over 60 Minutes Intravenous Every 8 hours 07/05/18 2245 07/06/18 1006   07/05/18 2300  vancomycin (VANCOCIN) IVPB 1000 mg/200 mL premix  Status:  Discontinued     1,000 mg 200 mL/hr over 60 Minutes Intravenous  Once 07/05/18 2245 07/05/18 2249   07/05/18 2300  vancomycin (VANCOCIN) 2,000 mg in sodium chloride 0.9 % 500 mL IVPB     2,000 mg 250 mL/hr over 120 Minutes Intravenous  Once 07/05/18 2249 07/06/18 0259   07/05/18 2030  ceFEPIme (MAXIPIME) 2 g in sodium chloride 0.9 % 100 mL IVPB  Status:  Discontinued     2 g 200 mL/hr over 30 Minutes Intravenous  Once 07/05/18 2019 07/05/18 2022   07/05/18 2030  metroNIDAZOLE (FLAGYL) IVPB 500 mg  Status:  Discontinued     500 mg 100 mL/hr over 60 Minutes Intravenous Every 8 hours 07/05/18 2019 07/05/18 2022   07/05/18 2030  vancomycin (VANCOCIN) IVPB 1000 mg/200 mL premix  Status:  Discontinued     1,000 mg 200 mL/hr over 60 Minutes Intravenous  Once 07/05/18 2019 07/05/18 2022       Objective: Physical Exam: Vitals:   07/06/18 1130 07/06/18 1155 07/06/18 1659 07/06/18 2336  BP: 101/68 98/71 (!) 99/54 131/87  Pulse:   85 79  Resp:   12 20  Temp:   98.3 F (36.8 C)  98.1 F (36.7 C)  TempSrc:   Oral Oral  SpO2:   91% 93%  Weight:      Height:        Intake/Output Summary (Last 24 hours) at 07/07/2018 1735 Last data filed at 07/07/2018 0645 Gross per 24 hour  Intake 1165.17 ml  Output 2200 ml  Net -1034.83 ml   Filed Weights   07/05/18 1900 07/06/18 0139  Weight: 114.8 kg 79.1 kg   General: Alert, Awake and Oriented to Time, Place and Person. Appear in mild distress, affect appropriate Eyes: PERRL, Conjunctiva normal ENT: Oral Mucosa clear moist. Neck: no JVD, no Abnormal Mass Or lumps Cardiovascular: S1 and S2 Present, no Murmur, Peripheral Pulses Present Respiratory: normal respiratory effort, Bilateral Air entry equal and Decreased, no use of accessory muscle, left Crackles, no wheezes Abdomen: Bowel Sound present, Soft and no tenderness, no hernia Skin: no redness, no Rash, no induration Extremities: no Pedal edema, no calf tenderness Neurologic: Grossly no focal neuro deficit. Bilaterally Equal motor strength  Data Reviewed: CBC: Recent Labs  Lab 07/05/18 1918 07/06/18 0218 07/06/18 0737 07/07/18 0835  WBC 18.4* 15.2*  --  14.2*  NEUTROABS 15.8*  --   --  11.5*  HGB 7.2* 7.7* 9.3* 9.3*  HCT 23.3* 24.6* 29.1* 29.9*  MCV 87.6 87.2  --  85.4  PLT 776* 666*  --  641*   Basic Metabolic Panel: Recent Labs  Lab 07/05/18 1918 07/06/18 0218 07/07/18 0835  NA 138 139 137  K 3.6 3.0* 3.7  CL 103 105 106  CO2 23 24 22   GLUCOSE 111* 99 96  BUN 22 18 10   CREATININE 0.90 0.83 0.65  CALCIUM 9.3 8.6* 8.4*  MG  --   --  1.7    Liver Function Tests: Recent Labs  Lab 07/05/18 1918 07/07/18 0835  AST 26 19  ALT 11 9  ALKPHOS 96 97  BILITOT 0.7 0.4  PROT 6.3* 5.5*  ALBUMIN 1.8* 1.4*   No results for input(s): LIPASE, AMYLASE in the last 168 hours. No results for input(s): AMMONIA in the last 168 hours. Coagulation Profile: Recent Labs  Lab 07/05/18 1918  INR 1.89   Cardiac Enzymes: No results for input(s): CKTOTAL,  CKMB, CKMBINDEX, TROPONINI in the last 168 hours. BNP (last 3 results) No results for input(s): PROBNP in the last 8760 hours. CBG: No results for input(s): GLUCAP in the last 168 hours. Studies: No results found.   Time spent: 35 minutes  Author: Lynden OxfordPranav Demontray Franta, MD [facility] 07/07/2018 5:35 PM  Between 7PM-7AM, please contact night-coverage at [email or web address]

## 2018-07-07 NOTE — Plan of Care (Signed)
  Problem: Education: Goal: Knowledge of General Education information will improve Description: Including pain rating scale, medication(s)/side effects and non-pharmacologic comfort measures Outcome: Progressing   Problem: Health Behavior/Discharge Planning: Goal: Ability to manage health-related needs will improve Outcome: Progressing   Problem: Clinical Measurements: Goal: Will remain free from infection Outcome: Progressing   Problem: Clinical Measurements: Goal: Respiratory complications will improve Outcome: Progressing   Problem: Clinical Measurements: Goal: Cardiovascular complication will be avoided Outcome: Progressing   

## 2018-07-07 NOTE — Progress Notes (Signed)
  SLP Cancellation Note  Patient Details Name: Wayne Buckley MRN: [id number] DOB: April 01, 1951   Cancelled treatment:       Reason Eval/Treat Not Completed: Patient declined, no reason specified. Pt sleeping, agitated upon arousal. Declines assessment at this time. Will continue efforts.  Rondel Baton, [place], CCC-SLP Speech-Language Pathologist [facility] Pager: [phone] Office: [phone]    Arlana Lindau 07/07/2018, 2:02 PM

## 2018-07-07 NOTE — Progress Notes (Signed)
CSW acknowledges consult, PT/OT have not made any recommendations. CSW will continue to follow and monitor.   Drucilla Schmidtaitlin Pieper Kasik, MSW, LCSW-A Clinical Social Worker [facility]

## 2018-07-07 NOTE — Progress Notes (Signed)
PHARMACY - PHYSICIAN COMMUNICATION CRITICAL VALUE ALERT - BLOOD CULTURE IDENTIFICATION (BCID)  Wayne Buckley is an 68 y.o. male who presented to [facility] on 07/05/2018 with a chief complaint of fever/sepsis  Assessment: already with known MRSA from earlier this evening, now with another positive blood culture for the same  Name of physician (or Provider) Contacted: [facility] ([facility])  Current antibiotics: Vancomycin   Changes to prescribed antibiotics recommended:  No changes  Results for orders placed or performed during the hospital encounter of 07/05/18  Blood Culture ID Panel (Reflexed) (Collected: 07/05/2018  9:00 PM)  Result Value Ref Range   Enterococcus species NOT DETECTED NOT DETECTED   Listeria monocytogenes NOT DETECTED NOT DETECTED   Staphylococcus species DETECTED (A) NOT DETECTED   Staphylococcus aureus (BCID) DETECTED (A) NOT DETECTED   Methicillin resistance DETECTED (A) NOT DETECTED   Streptococcus species NOT DETECTED NOT DETECTED   Streptococcus agalactiae NOT DETECTED NOT DETECTED   Streptococcus pneumoniae NOT DETECTED NOT DETECTED   Streptococcus pyogenes NOT DETECTED NOT DETECTED   Acinetobacter baumannii NOT DETECTED NOT DETECTED   Enterobacteriaceae species NOT DETECTED NOT DETECTED   Enterobacter cloacae complex NOT DETECTED NOT DETECTED   Escherichia coli NOT DETECTED NOT DETECTED   Klebsiella oxytoca NOT DETECTED NOT DETECTED   Klebsiella pneumoniae NOT DETECTED NOT DETECTED   Proteus species NOT DETECTED NOT DETECTED   Serratia marcescens NOT DETECTED NOT DETECTED   Haemophilus influenzae NOT DETECTED NOT DETECTED   Neisseria meningitidis NOT DETECTED NOT DETECTED   Pseudomonas aeruginosa NOT DETECTED NOT DETECTED   Candida albicans NOT DETECTED NOT DETECTED   Candida glabrata NOT DETECTED NOT DETECTED   Candida krusei NOT DETECTED NOT DETECTED   Candida parapsilosis NOT DETECTED NOT DETECTED   Candida tropicalis NOT DETECTED NOT DETECTED     Ismaeel, [place] 07/07/2018  12:07 AM

## 2018-07-07 NOTE — Consult Note (Signed)
Date of Admission:  07/05/2018          Reason for Consult: MRSA bacteremia pneumonia parapneumonic effusion and discitis   Referring Provider:"vigilanz" auto consult and Dr. Allena KatzPatel   Assessment:  1. Recurrent MRSA bacteremia with 2. Left-sided pneumonia and parapneumonic effusion that is loculated 3. 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 History of cervical spine surgery after severe cord compression C6-7 5. History of traumatic brain injury 6. Neurogenic bladder 7. History of acute kidney injury while being treated with vancomycin but also when he had obstruction of his catheter  Plan:  1. Continue vancomycin for now and carefully monitor 2. Repeat blood cultures 3. Transthoracic echocardiogram 4. Agree with consulting critical care regarding his loculated pleural effusion not sure if this is going to be managed only with a chest tube versus requiring cardiothoracic surgery for decortication 5. Pain MRI of C-spine T and L-spine which may require anesthesia because the patient has a difficult time keeping still 6. Very close monitoring of his neurological status 7. Would let Dr. Wynetta Emerycram know that he is in the hospital with these findings  Principal Problem:   Fever Active Problems:   Urinary retention   C5-C7 level spinal cord injury [facility](HCC)   Essential hypertension   Chronic anemia   Traumatic brain injury (HCC)   Scheduled Meds: . apixaban  5 mg Oral BID  . Chlorhexidine Gluconate Cloth  6 each Topical Q0600  . ferrous sulfate  325 mg Oral BID WC  . mupirocin ointment  1 application Nasal BID  . tamsulosin  0.4 mg Oral Daily   Continuous Infusions: . sodium chloride 75 mL/hr at 07/07/18 0318  . vancomycin 1,000 mg (07/07/18 0130)   PRN Meds:.acetaminophen, polyethylene glycol  HPI: Wayne Buckley is a 68 y.o. 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  He then presented in September after a fall and was noted to have lower extremity weakness.  Cranial CT showed no acute changes but imaging of the cervical spine revealed severe cord compression at C6 and C7 and he underwent ACDF and fusion on 02/28/2018.  Patient continued to have motor deficits was referred to inpatient rehab program.  Along the way he had imaging of the thoracic and lumbar spine which showed degenerative changes but no critical findings.  In November he was admitted with MRSA bacteremia and seen by us with infectious disease.  Source of his bacteremia at that time was not clear.  He underwent transthoracic echocardiogram did not undergo transesophageal echocardiogram I presume because of his cervical spine injury.  His hospital course was complicated by acute kidney injury initially thought to be due to vancomycin though it is modest and he also had obstruction of his catheter so there may have been a component of postobstructive renal failure.  In any case he was changed to daptomycin with plans to finish up a 4-week course of antibiotics.  He was supposed to be seen in the infectious disease clinic in late December.  He does have an appointment with Dr. Drue SecondSnider coming up this next week which we will cancel.  He has been residing in a skilled nursing facility and was sent to the hospital due to an hemoglobin that was less than 7.  He had been recently started on levofloxacin for pneumonia that was seen on chest x-ray.  When he came to the ER he was found to be febrile and with low blood pressures.  Work-up including chest x-ray showed  Left upper and midlung pneumonia with a large pleural effusion.  A CT scan was obtained which showed  Large loculated left pleural effusion with associated upper and lower lobe consolidation.  Small right pleural effusion and basilar atelectasis.  Considerable erosive changes at  T11-T12 as well as T12-L1 with surrounding soft tissue change highly suspicious for diskitis and underlying osteomyelitis. MRI would be helpful for further evaluation. Some suggestion of soft tissue encroachment into the spinal canal is noted at T11-T12. These changes are new from the prior CT examination from 2 months previous.  Blood cultures have been obtained and he was started on antibiotics in the form of Comycin cefepime and metronidazole.  Subsequently the blood cultures have turned positive by [facility] ID which have shown methicillin-resistant Staphylococcus aureus.  He has been narrowed to vancomycin.  His pleural effusion fluid has been sampled and has an LDH of 146 glucose of 75 white blood cells of 1755 with 51% lymphocytes and 38% neutrophils.  Suspect that he seeded his spine with his prior bacteremia and is experiencing now recurrent bacteremia due to infection in the thoracic and lumbar spine along with his pneumonia and parapneumonic effusion.  I am a little anxious about using vancomycin given his prior acute kidney injury but perhaps that was in fact due more to his obstructive problem with his clogged catheter.  We will watch him closely if he cannot do well on Comycin I would change him to Teflaro.  Dr.Comer to take over the service tomorrow.   Review of Systems: Review of Systems  Unable to perform ROS: Mental acuity    Past Medical History:  Diagnosis Date  . BPH (benign prostatic hyperplasia)   . DVT (deep venous thrombosis) (HCC) 02/2018   extensive LLE DVT  . H/O alcohol abuse   . Hypertension   . Neurogenic bowel   . Spinal cord injury, cervical region (HCC) 02/2018  . TBI (traumatic brain injury) (HCC) 2013   SDH with left partial frontal lobectomy    Social History   Tobacco Use  . Smoking status: Never Smoker  . Smokeless tobacco: Never Used  Substance Use Topics  . Alcohol use: Yes    Comment: daily   . Drug use: Not on file    Family History    Problem Relation Age of Onset  . Heart disease Father    No Known Allergies  OBJECTIVE: Blood pressure 131/87, pulse 79, temperature 98.1 F (36.7 C), temperature source Oral, resp. rate 20, height 5\' 9"  (1.753 m), weight 79.1 kg, SpO2 93 %.  Physical Exam Constitutional:      General: He is not in acute distress.    Appearance: Normal appearance. He is well-developed. He is not ill-appearing or diaphoretic.  HENT:     Head: Normocephalic and atraumatic.     Right Ear: Hearing and external ear normal.     Left Ear: Hearing and external ear normal.     Nose: No nasal deformity or rhinorrhea.  Eyes:     General: No scleral icterus.    Conjunctiva/sclera: Conjunctivae normal.     Right eye: Right conjunctiva is not injected.     Left eye: Left conjunctiva is not injected.  Neck:     Musculoskeletal: Normal range of motion and neck supple.     Vascular: No JVD.  Cardiovascular:  Rate and Rhythm: Normal rate and regular rhythm.     Heart sounds: S1 normal and S2 normal.  Pulmonary:     Effort: Pulmonary effort is normal. No respiratory distress.     Breath sounds: No wheezing.  Abdominal:     General: Bowel sounds are normal. There is no distension.     Tenderness: There is no abdominal tenderness.  Musculoskeletal: Normal range of motion.     Right shoulder: Normal.     Left shoulder: Normal.     Right hip: Normal.     Left hip: Normal.     Right knee: Normal.     Left knee: Normal.  Lymphadenopathy:     Head:     Right side of head: No submandibular, preauricular or posterior auricular adenopathy.     Left side of head: No submandibular, preauricular or posterior auricular adenopathy.     Cervical: No cervical adenopathy.     Right cervical: No superficial or deep cervical adenopathy.    Left cervical: No superficial or deep cervical adenopathy.  Skin:    General: Skin is warm and dry.     Coloration: Skin is not pale.     Findings: No abrasion, bruising,  ecchymosis, erythema, lesion or rash.     Nails: There is no clubbing.   Neurological:     Mental Status: He is alert.  Psychiatric:        Attention and Perception: He is attentive.        Mood and Affect: Mood normal.        Speech: Speech is tangential.        Behavior: Behavior is cooperative.        Cognition and Memory: Cognition is impaired.    He has 5 out of 5 upper extremity grip though the left grip is weaker than right he cannot lift his right or left leg against gravity he can flex and extend around the knee joint with assistance from his wife.  Lab Results Lab Results  Component Value Date   WBC 14.2 (H) 07/07/2018   HGB 9.3 (L) 07/07/2018   HCT 29.9 (L) 07/07/2018   MCV 85.4 07/07/2018   PLT 641 (H) 07/07/2018    Lab Results  Component Value Date   CREATININE 0.65 07/07/2018   BUN 10 07/07/2018   NA 137 07/07/2018   K 3.7 07/07/2018   CL 106 07/07/2018   CO2 22 07/07/2018    Lab Results  Component Value Date   ALT 9 07/07/2018   AST 19 07/07/2018   ALKPHOS 97 07/07/2018   BILITOT 0.4 07/07/2018     Microbiology: Recent Results (from the past 240 hour(s))  Blood Culture (routine x 2)     Status: Abnormal (Preliminary result)   Collection Time: 07/05/18  9:00 PM  Result Value Ref Range Status   Specimen Description BLOOD LEFT ANTECUBITAL  Final   Special Requests   Final    BOTTLES DRAWN AEROBIC AND ANAEROBIC Blood Culture adequate volume   Culture  Setup Time   Final    GRAM POSITIVE COCCI IN CLUSTERS AEROBIC BOTTLE ONLY CRITICAL RESULT CALLED TO, READ BACK BY AND VERIFIED WITH: M. PHAM, PHARMD AT 1747 ON 07/06/18 BY C. JESSUP, MLT.    Culture (A)  Final    STAPHYLOCOCCUS AUREUS SUSCEPTIBILITIES TO FOLLOW Performed at [facility], [address] [place], [place] [address]    Report Status PENDING  Incomplete  Blood Culture ID Panel (Reflexed)  Status: Abnormal   Collection Time: 07/05/18  9:00 PM  Result Value Ref Range Status    Enterococcus species NOT DETECTED NOT DETECTED Final   Listeria monocytogenes NOT DETECTED NOT DETECTED Final   Staphylococcus species DETECTED (A) NOT DETECTED Final    Comment: CRITICAL RESULT CALLED TO, READ BACK BY AND VERIFIED WITH: M. PHAM, PHARMD AT 1747 ON 07/06/18 BY C. JESSUP, MLT.    Staphylococcus aureus (BCID) DETECTED (A) NOT DETECTED Final    Comment: Methicillin (oxacillin)-resistant Staphylococcus aureus (MRSA). MRSA is predictably resistant to beta-lactam antibiotics (except ceftaroline). Preferred therapy is vancomycin unless clinically contraindicated. Patient requires contact precautions if  hospitalized. CRITICAL RESULT CALLED TO, READ BACK BY AND VERIFIED WITH: M. PHAM, PHARMD AT 1747 ON 07/06/18 BY C. JESSUP, MLT.    Methicillin resistance DETECTED (A) NOT DETECTED Final    Comment: CRITICAL RESULT CALLED TO, READ BACK BY AND VERIFIED WITH: M. PHAM, PHARMD AT 1747 ON 07/06/18 BY C. JESSUP, MLT.    Streptococcus species NOT DETECTED NOT DETECTED Final   Streptococcus agalactiae NOT DETECTED NOT DETECTED Final   Streptococcus pneumoniae NOT DETECTED NOT DETECTED Final   Streptococcus pyogenes NOT DETECTED NOT DETECTED Final   Acinetobacter baumannii NOT DETECTED NOT DETECTED Final   Enterobacteriaceae species NOT DETECTED NOT DETECTED Final   Enterobacter cloacae complex NOT DETECTED NOT DETECTED Final   Escherichia coli NOT DETECTED NOT DETECTED Final   Klebsiella oxytoca NOT DETECTED NOT DETECTED Final   Klebsiella pneumoniae NOT DETECTED NOT DETECTED Final   Proteus species NOT DETECTED NOT DETECTED Final   Serratia marcescens NOT DETECTED NOT DETECTED Final   Haemophilus influenzae NOT DETECTED NOT DETECTED Final   Neisseria meningitidis NOT DETECTED NOT DETECTED Final   Pseudomonas aeruginosa NOT DETECTED NOT DETECTED Final   Candida albicans NOT DETECTED NOT DETECTED Final   Candida glabrata NOT DETECTED NOT DETECTED Final   Candida krusei NOT DETECTED NOT  DETECTED Final   Candida parapsilosis NOT DETECTED NOT DETECTED Final   Candida tropicalis NOT DETECTED NOT DETECTED Final    Comment: Performed at [facility], [address] [place], [place] [address]  Urine culture     Status: None   Collection Time: 07/05/18  9:07 PM  Result Value Ref Range Status   Specimen Description URINE, CATHETERIZED  Final   Special Requests Normal  Final   Culture   Final    NO GROWTH Performed at [facility], [address], [place], [place] [address]    Report Status 07/07/2018 FINAL  Final  Blood Culture (routine x 2)     Status: None (Preliminary result)   Collection Time: 07/05/18  9:15 PM  Result Value Ref Range Status   Specimen Description BLOOD RIGHT HAND  Final   Special Requests   Final    BOTTLES DRAWN AEROBIC AND ANAEROBIC Blood Culture adequate volume   Culture  Setup Time   Final    GRAM POSITIVE COCCI IN CLUSTERS AEROBIC BOTTLE ONLY CRITICAL RESULT CALLED TO, READ BACK BY AND VERIFIED WITH: J. LEDFORD,PHARMD 2353 07/06/2018 T. TYSOR    Culture   Final    GRAM POSITIVE COCCI TOO YOUNG TO READ Performed at [facility], [address], [place], [place] [address]    Report Status PENDING  Incomplete  MRSA PCR Screening     Status: Abnormal   Collection Time: 07/06/18  3:04 AM  Result Value Ref Range Status   MRSA by PCR POSITIVE (A)  NEGATIVE Final    Comment:        The GeneXpert MRSA Assay (FDA approved for NASAL specimens only), is one component of a comprehensive MRSA colonization surveillance program. It is not intended to diagnose MRSA infection nor to guide or monitor treatment for MRSA infections. CRITICAL RESULT CALLED TO, READ BACK BY AND VERIFIED WITH: Vanessa Barbara, RN AT 1740 ON 07/06/18 BY C. JESSUP, MLT. Performed at [facility], [address] [place], [place] [address]   Gram stain     Status: None   Collection Time: 07/06/18 12:05 PM  Result Value Ref Range Status   Specimen  Description THORACENTESIS FLUID  Final   Special Requests SYRINGE  Final   Gram Stain   Final    FEW WBC PRESENT, PREDOMINANTLY MONONUCLEAR NO ORGANISMS SEEN Performed at [facility], [address] [place], [place] [address]    Report Status 07/06/2018 FINAL  Final    Acey Lav, MD [facility] for Infectious Disease [facility] [phone] pager  07/07/2018, 10:13 AM

## 2018-07-07 NOTE — Progress Notes (Signed)
Pharmacy Antibiotic Note  Wayne Buckley is a 68 y.o. male admitted on 07/05/2018 with low Hgb. He was started on vanc/cefepime before transition to just vanc due to his recurrent MRSA bacteremia/diskitis. A vanc peak was ordered last night that came back at 15 (doesn't make any sense) and a trough came back at 21 this AM. We will reduce the dose today and repeat levels on Tues. We will calculate AUC at that point. His vanc trough was therapeutic on 750mg  q12 at the last admission.   Plan: Decrease vanc to 750mg  IV q12 Levels on tues  Height: 5\' 9"  (175.3 cm) Weight: 174 lb 6.1 oz (79.1 kg) IBW/kg (Calculated) : 70.7  Temp (24hrs), Avg:98.2 F (36.8 C), Min:98.1 F (36.7 C), Max:98.3 F (36.8 C)  Recent Labs  Lab 07/05/18 1918 07/05/18 2059 07/06/18 0218 07/07/18 0231 07/07/18 0835 07/07/18 1104  WBC 18.4*  --  15.2*  --  14.2*  --   CREATININE 0.90  --  0.83  --  0.65  --   LATICACIDVEN  --  0.77  --   --   --   --   VANCOTROUGH  --   --   --   --   --  21*  VANCOPEAK  --   --   --  15*  --   --     Estimated Creatinine Clearance: 88.4 mL/min (by C-G formula based on SCr of 0.65 mg/dL).    No Known Allergies   Ulyses Southward, PharmD, BCIDP, AAHIVP, CPP Infectious Disease Pharmacist 07/07/2018 12:18 PM

## 2018-07-08 ENCOUNTER — Encounter (HOSPITAL_COMMUNITY): Payer: Self-pay | Admitting: Certified Registered Nurse Anesthetist

## 2018-07-08 ENCOUNTER — Encounter (HOSPITAL_COMMUNITY)
Admission: EM | Disposition: A | Discharge status: Nursing Home (Includes ICF) | Payer: Self-pay | Source: Skilled Nursing Facility | Attending: Internal Medicine

## 2018-07-08 ENCOUNTER — Inpatient Hospital Stay (HOSPITAL_COMMUNITY): Payer: Medicare Other | Admitting: Certified Registered Nurse Anesthetist

## 2018-07-08 ENCOUNTER — Other Ambulatory Visit (HOSPITAL_COMMUNITY): Payer: Medicare Other

## 2018-07-08 ENCOUNTER — Inpatient Hospital Stay (HOSPITAL_COMMUNITY): Payer: Medicare Other

## 2018-07-08 DIAGNOSIS — M4720 Other spondylosis with radiculopathy, site unspecified: Secondary | ICD-10-CM

## 2018-07-08 DIAGNOSIS — Z8614 Personal history of Methicillin resistant Staphylococcus aureus infection: Secondary | ICD-10-CM

## 2018-07-08 DIAGNOSIS — Z87311 Personal history of (healed) other pathological fracture: Secondary | ICD-10-CM

## 2018-07-08 HISTORY — PX: RADIOLOGY WITH ANESTHESIA: SHX6223

## 2018-07-08 LAB — BASIC METABOLIC PANEL
Anion gap: 8 (ref 5–15)
BUN: 7 mg/dL — ABNORMAL LOW (ref 8–23)
CHLORIDE: 106 mmol/L (ref 98–111)
CO2: 25 mmol/L (ref 22–32)
Calcium: 8.4 mg/dL — ABNORMAL LOW (ref 8.9–10.3)
Creatinine, Ser: 0.7 mg/dL (ref 0.61–1.24)
GFR calc Af Amer: >60 mL/min (ref >60)
GFR calc non Af Amer: >60 mL/min (ref >60)
Glucose, Bld: 84 mg/dL (ref 70–99)
Potassium: 3.8 mmol/L (ref 3.5–5.1)
Sodium: 139 mmol/L (ref 135–145)

## 2018-07-08 LAB — CULTURE, BLOOD (ROUTINE X 2): Special Requests: ADEQUATE

## 2018-07-08 SURGERY — MRI WITH ANESTHESIA
Anesthesia: General

## 2018-07-08 MED ORDER — FENTANYL CITRATE (PF) 100 MCG/2ML IJ SOLN
INTRAMUSCULAR | Status: AC
  Filled 2018-07-08: qty 2

## 2018-07-08 MED ORDER — LACTATED RINGERS IV SOLN
INTRAVENOUS | Status: DC
  Administered 2018-07-08 – 2018-07-16 (×3): via INTRAVENOUS

## 2018-07-08 MED ORDER — HYDROCODONE-ACETAMINOPHEN 5-325 MG PO TABS
1.0000 | ORAL_TABLET | Freq: Four times a day (QID) | ORAL | Status: DC | PRN
  Filled 2018-07-08: qty 1

## 2018-07-08 MED ORDER — FENTANYL CITRATE (PF) 100 MCG/2ML IJ SOLN: 12.5000 ug | INTRAMUSCULAR | Status: DC | PRN

## 2018-07-08 MED ORDER — MIDAZOLAM HCL 2 MG/2ML IJ SOLN
INTRAMUSCULAR | Status: AC
  Filled 2018-07-08: qty 2

## 2018-07-08 MED ORDER — GADOBUTROL 1 MMOL/ML IV SOLN
7.0000 mL | Freq: Once | INTRAVENOUS | Status: AC | PRN
  Administered 2018-07-08: 7 mL via INTRAVENOUS

## 2018-07-08 MED ORDER — FENTANYL CITRATE (PF) 100 MCG/2ML IJ SOLN
INTRAMUSCULAR | Status: AC | PRN
  Administered 2018-07-08 (×2): 25 ug via INTRAVENOUS

## 2018-07-08 MED ORDER — MIDAZOLAM HCL 2 MG/2ML IJ SOLN
INTRAMUSCULAR | Status: AC | PRN
  Administered 2018-07-08 (×2): 1 mg via INTRAVENOUS

## 2018-07-08 NOTE — Progress Notes (Signed)
[facility]  Date of Admission:  07/05/2018     Total days of antibiotics 4 Vancomycin           Patient ID: Wayne Buckley is a 68 y.o. M with  Principal Problem:   MRSA bacteremia Active Problems:   Pleural effusion on left   Urinary retention   C5-C7 level spinal cord injury (HCC)   Essential hypertension   Chronic anemia   Traumatic brain injury (HCC)   Fever   . Chlorhexidine Gluconate Cloth  6 each Topical Q0600  . ferrous sulfate  325 mg Oral BID WC  . mupirocin ointment  1 application Nasal BID  . tamsulosin  0.4 mg Oral Daily    SUBJECTIVE: "Why do you keep waking me up"  No Known Allergies  OBJECTIVE: Vitals:   07/07/18 1741 07/08/18 0220 07/08/18 0222 07/08/18 0817  BP: 121/72  114/77 109/76  Pulse: 88   87  Resp: (!) 22  16 18   Temp: 97.6 F (36.4 C)  98.3 F (36.8 C) 98.4 F (36.9 C)  TempSrc: Oral  Oral Oral  SpO2: 100% (!) 84% 90% 100%  Weight:      Height:       Body mass index is 25.75 kg/m.  Physical Exam Vitals signs and nursing note reviewed.  Constitutional:      Appearance: Wayne Buckley is not ill-appearing or toxic-appearing.     Comments: Resting in bed.   HENT:     Mouth/Throat:     Mouth: Mucous membranes are moist.     Pharynx: No oropharyngeal exudate.  Neck:     Musculoskeletal: Normal range of motion. No muscular tenderness.  Cardiovascular:     Rate and Rhythm: Normal rate and regular rhythm.     Heart sounds: No murmur.  Pulmonary:     Effort: Pulmonary effort is normal. No respiratory distress.     Breath sounds: Rales present.     Comments: Nasal cannula.  Abdominal:     General: Bowel sounds are normal.     Palpations: Abdomen is soft.     Tenderness: There is no abdominal tenderness.  Musculoskeletal: Normal range of motion.  Lymphadenopathy:     Cervical: No cervical adenopathy.  Skin:    General: Skin is warm and dry.  Neurological:     Mental Status: Wayne Buckley is alert and oriented  to person, place, and time.  Psychiatric:        Mood and Affect: Mood normal.     Lab Results Lab Results  Component Value Date   WBC 14.2 (H) 07/07/2018   HGB 9.3 (L) 07/07/2018   HCT 29.9 (L) 07/07/2018   MCV 85.4 07/07/2018   PLT 641 (H) 07/07/2018    Lab Results  Component Value Date   CREATININE 0.70 07/08/2018   BUN 7 (L) 07/08/2018   NA 139 07/08/2018   K 3.8 07/08/2018   CL 106 07/08/2018   CO2 25 07/08/2018    Lab Results  Component Value Date   ALT 9 07/07/2018   AST 19 07/07/2018   ALKPHOS 97 07/07/2018   BILITOT 0.4 07/07/2018     Microbiology: BCx 1/17 >> MRSA 2/2 BCx 1/18 >> NGTD  ASSESSMENT: Wayne Buckley is a 68 y.o. male with with traumatic brain injury history here with MRSA bacteremia as well as left sided pneumonia with parapneumonic loculated effusion. Wayne Buckley has h/o c-spine surgery @ C6-C7after severe cord compression  (  s/p fusion 02/28/18) as well as multilevel degenerative spondylolysis and severe foraminal narrowing with many small disc protrusions seen on MRI 02-2018. Repeat blood cultures cleared in 24 hours - OK to arrange PICC line placement now. Wayne Buckley is scheduled to undergo MRI to evaluate this today under anesthesia. Pulmonary source vs HW from C-spine? Pending results of MRI may need to consider adding Rifampin.   Unable to drain left effusion with thoracentesis - PCCM consulted with recommendations for CT-guided drain with TPA once Wayne Buckley is off anticoagulants.   Wayne Buckley has had MRSA bacteremia November 14th  - TEE was unable to be performed d/t possible esophageal obstruction. Wayne Buckley was treated at this time with Vancomycin and changed to Dapto through 12/13.    PLAN: 1. Consider adding rifampin pending MRI of c-spine today  2. IR to place pleural drain for loculated effusion  3. TTE pending for today  4. Repeat blood cultures have cleared - OK to place PICC line once other studies are done (tomorrow may be better for him d/t multiple procedures  today) 5. Continue Vancomycin - levels on Tuesday  6. Creatinine monitoring   Rexene Alberts, MSN, NP-C [facility] for Infectious Disease [facility] Cell: [phone] Pager: [phone]  07/08/2018  9:36 AM

## 2018-07-08 NOTE — Progress Notes (Signed)
SLP Cancellation Note  Patient Details Name: Wayne Buckley MRN: [id number] DOB: Mar 22, 1951   Cancelled treatment:        Attempted to see pt for swallowing evaluation.  Pt NPO for procedure later this date and cannot participate in PO trials at this time.  SLP will re-attempt as schedule permits.   Kerrie Pleasure, MA, CCC-SLP Acute Rehabilitation Services Office: [phone]; Pager (1/20): [phone] 07/08/2018, 9:17 AM

## 2018-07-08 NOTE — Sedation Documentation (Signed)
Vital signs stable. 

## 2018-07-08 NOTE — Anesthesia Preprocedure Evaluation (Signed)
Anesthesia Evaluation  Patient identified by MRN, date of birth, ID band Patient awake    Reviewed: Allergy & Precautions, NPO status , Patient's Chart, lab work & pertinent test results  Airway Mallampati: II  TM Distance: >3 FB Neck ROM: Full    Dental no notable dental hx.    Pulmonary neg pulmonary ROS,    Pulmonary exam normal breath sounds clear to auscultation       Cardiovascular hypertension, Pt. on medications + DVT  negative cardio ROS Normal cardiovascular exam Rhythm:Regular Rate:Normal     Neuro/Psych TBI negative psych ROS   GI/Hepatic negative GI ROS, Neg liver ROS,   Endo/Other  negative endocrine ROS  Renal/GU negative Renal ROS  negative genitourinary   Musculoskeletal negative musculoskeletal ROS (+)   Abdominal   Peds negative pediatric ROS (+)  Hematology negative hematology ROS (+)   Anesthesia Other Findings   Reproductive/Obstetrics negative OB ROS                             Anesthesia Physical Anesthesia Plan  ASA: III  Anesthesia Plan: General   Post-op Pain Management:    Induction: Intravenous  PONV Risk Score and Plan: 2 and Treatment may vary due to age or medical condition  Airway Management Planned: LMA and Oral ETT  Additional Equipment:   Intra-op Plan:   Post-operative Plan:   Informed Consent: I have reviewed the patients History and Physical, chart, labs and discussed the procedure including the risks, benefits and alternatives for the proposed anesthesia with the patient or authorized representative who has indicated his/her understanding and acceptance.     Dental advisory given  Plan Discussed with: CRNA  Anesthesia Plan Comments:         Anesthesia Quick Evaluation

## 2018-07-08 NOTE — Progress Notes (Signed)
[facility] Progress Note  Patient: Wayne Buckley UEA:[id number]   PCP: Patient, No Pcp Per DOB: 10/30/1950   DOA: 07/05/2018   DOS: 07/08/2018   Date of Service: the patient was seen and examined on 07/08/2018  Brief hospital course: Pt. with PMH of MRSA bacteremia, TBI, neurogenic bladder chronic indwelling Foley catheter, prior DVT, anemia; admitted on 07/05/2018, presented with complaint of fatigue and abnormal lab, was found to have symptomatic anemia as well as MRSA bacteremia. Currently further plan is continue IV antibiotics.  Subjective: No new complaints no nausea acute events.  No nausea no vomiting.  Feels better.  Telemetry: Sinus tachycardia  Assessment and Plan: Recurrent MRSA bacteremia Left-sided pneumonia with loculated left effusion Osteomyelitis of thoracic vertebra Chronic indwelling Foley catheter Discitis and osteomyelitis of the thoracic and lumbar vertebra. 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 Treated in November, completed treatment in December. Echocardiogram at that time showed no vegetation. Now comes to the hospital with abnormal lab and fever and now found to have positive blood cultures with loculated effusion as well as osteomyelitis of the vertebra. Repeat cultures are ordered. Echocardiogram repeat one ordered as well. Appreciate ID and PCCM consultation. Underwent IR guided thoracentesis but it was not able to remove all the fluid. Due to severe loculation CCM was consulted and patient underwent CT-guided drain placement. Currently in the suction.  May require TPA insertion. MRI shows evidence of epidural abscess as well as discitis and osteomyelitis.  Neurosurgery feels that the patient does not need any surgery right now.  Abdominal distension/ ileus- Ogilvie's ? Continue stool softener and as needed Dulcolax suppository as well as enema.  Hypokalemia - replaced  H/o TBI, cord compression - lives in SNF - chronic  foley for neurogenic bladder  Essential hypertension -BP stable, continue amlodipine and Coreg  Iron deficiency anemia and probable anemia of chronic illness - cont Ferrous Sulfate, also given IV iron.  Will require IV iron outpatient as well. - transfused 3 U PRBC, IV iron provided as well.  History of DVT (deep vein thrombosis) - Eliquis  Unstageable pressure injury of bilateral buttocks POA.  Pressure Injury 05/03/18 Unstageable - Full thickness tissue loss in which the base of the ulcer is covered by slough (yellow, tan, gray, green or brown) and/or eschar (tan, brown or black) in the wound bed. (Active)  05/03/18 0200  Location: Buttocks  Location Orientation: Left;Right  Staging: Unstageable - Full thickness tissue loss in which the base of the ulcer is covered by slough (yellow, tan, gray, green or brown) and/or eschar (tan, brown or black) in the wound bed.  Wound Description (Comments):   Present on Admission: Yes   Diet: Cardiac diet DVT Prophylaxis: on therapeutic anticoagulation.  Advance goals of care discussion: Full code  Family Communication: family was present at bedside, at the time of interview. The pt provided permission to discuss medical plan with the family. Opportunity was given to ask question and all questions were answered satisfactorily.   Disposition:  Discharge to SNF.  Consultants: PCCM ID Procedures: Echocardiogram   Scheduled Meds: . Chlorhexidine Gluconate Cloth  6 each Topical Q0600  . fentaNYL      . fentaNYL      . ferrous sulfate  325 mg Oral BID WC  . midazolam      . mupirocin ointment  1 application Nasal BID  . tamsulosin  0.4 mg Oral Daily   Continuous Infusions: . lactated ringers 10 mL/hr at 07/08/18 1035  .  vancomycin 750 mg (07/08/18 1839)   PRN Meds: acetaminophen, fentaNYL (SUBLIMAZE) injection, HYDROcodone-acetaminophen, polyethylene glycol Antibiotics: Anti-infectives (From admission, onward)   Start      Dose/Rate Route Frequency Ordered Stop   07/08/18 1800  vancomycin (VANCOCIN) IVPB 750 mg/150 ml premix     750 mg 150 mL/hr over 60 Minutes Intravenous Every 12 hours 07/07/18 1219     07/06/18 1200  vancomycin (VANCOCIN) IVPB 1000 mg/200 mL premix  Status:  Discontinued     1,000 mg 200 mL/hr over 60 Minutes Intravenous Every 12 hours 07/06/18 1014 07/07/18 1219   07/06/18 1000  vancomycin (VANCOCIN) IVPB 1000 mg/200 mL premix  Status:  Discontinued     1,000 mg 200 mL/hr over 60 Minutes Intravenous Every 12 hours 07/06/18 0122 07/06/18 1006   07/06/18 1000  ceFEPIme (MAXIPIME) 2 g in sodium chloride 0.9 % 100 mL IVPB  Status:  Discontinued     2 g 200 mL/hr over 30 Minutes Intravenous Every 12 hours 07/06/18 0122 07/06/18 1756   07/05/18 2300  ceFEPIme (MAXIPIME) 2 g in sodium chloride 0.9 % 100 mL IVPB     2 g 200 mL/hr over 30 Minutes Intravenous  Once 07/05/18 2245 07/06/18 0014   07/05/18 2300  metroNIDAZOLE (FLAGYL) IVPB 500 mg  Status:  Discontinued     500 mg 100 mL/hr over 60 Minutes Intravenous Every 8 hours 07/05/18 2245 07/06/18 1006   07/05/18 2300  vancomycin (VANCOCIN) IVPB 1000 mg/200 mL premix  Status:  Discontinued     1,000 mg 200 mL/hr over 60 Minutes Intravenous  Once 07/05/18 2245 07/05/18 2249   07/05/18 2300  vancomycin (VANCOCIN) 2,000 mg in sodium chloride 0.9 % 500 mL IVPB     2,000 mg 250 mL/hr over 120 Minutes Intravenous  Once 07/05/18 2249 07/06/18 0259   07/05/18 2030  ceFEPIme (MAXIPIME) 2 g in sodium chloride 0.9 % 100 mL IVPB  Status:  Discontinued     2 g 200 mL/hr over 30 Minutes Intravenous  Once 07/05/18 2019 07/05/18 2022   07/05/18 2030  metroNIDAZOLE (FLAGYL) IVPB 500 mg  Status:  Discontinued     500 mg 100 mL/hr over 60 Minutes Intravenous Every 8 hours 07/05/18 2019 07/05/18 2022   07/05/18 2030  vancomycin (VANCOCIN) IVPB 1000 mg/200 mL premix  Status:  Discontinued     1,000 mg 200 mL/hr over 60 Minutes Intravenous  Once 07/05/18 2019  07/05/18 2022       Objective: Physical Exam: Vitals:   07/08/18 1651 07/08/18 1655 07/08/18 1700 07/08/18 1707  BP: 101/68 101/68 106/66 103/62  Pulse: 94 96 80 91  Resp: 15 15 20 20   Temp:      TempSrc:      SpO2: 93% 97% 94% 90%  Weight:      Height:        Intake/Output Summary (Last 24 hours) at 07/08/2018 1848 Last data filed at 07/08/2018 1500 Gross per 24 hour  Intake -  Output 525 ml  Net -525 ml   Filed Weights   07/05/18 1900 07/06/18 0139  Weight: 114.8 kg 79.1 kg   General: Alert, Awake and Oriented to Time, Place and Person. Appear in mild distress, affect appropriate Eyes: PERRL, Conjunctiva normal ENT: Oral Mucosa clear moist. Neck: no JVD, no Abnormal Mass Or lumps Cardiovascular: S1 and S2 Present, no Murmur, Peripheral Pulses Present Respiratory: normal respiratory effort, Bilateral Air entry equal and Decreased, no use of accessory muscle, left Crackles, no wheezes Abdomen:  Bowel Sound present, Soft and no tenderness, no hernia Skin: no redness, no Rash, no induration Extremities: no Pedal edema, no calf tenderness Neurologic: Grossly no focal neuro deficit. Bilaterally Equal motor strength  Data Reviewed: CBC: Recent Labs  Lab 07/05/18 1918 07/06/18 0218 07/06/18 0737 07/07/18 0835  WBC 18.4* 15.2*  --  14.2*  NEUTROABS 15.8*  --   --  11.5*  HGB 7.2* 7.7* 9.3* 9.3*  HCT 23.3* 24.6* 29.1* 29.9*  MCV 87.6 87.2  --  85.4  PLT 776* 666*  --  641*   Basic Metabolic Panel: Recent Labs  Lab 07/05/18 1918 07/06/18 0218 07/07/18 0835 07/08/18 0301  NA 138 139 137 139  K 3.6 3.0* 3.7 3.8  CL 103 105 106 106  CO2 23 24 22 25   GLUCOSE 111* 99 96 84  BUN 22 18 10  7*  CREATININE 0.90 0.83 0.65 0.70  CALCIUM 9.3 8.6* 8.4* 8.4*  MG  --   --  1.7  --     Liver Function Tests: Recent Labs  Lab 07/05/18 1918 07/07/18 0835  AST 26 19  ALT 11 9  ALKPHOS 96 97  BILITOT 0.7 0.4  PROT 6.3* 5.5*  ALBUMIN 1.8* 1.4*   No results for  input(s): LIPASE, AMYLASE in the last 168 hours. No results for input(s): AMMONIA in the last 168 hours. Coagulation Profile: Recent Labs  Lab 07/05/18 1918  INR 1.89   Cardiac Enzymes: No results for input(s): CKTOTAL, CKMB, CKMBINDEX, TROPONINI in the last 168 hours. BNP (last 3 results) No results for input(s): PROBNP in the last 8760 hours. CBG: No results for input(s): GLUCAP in the last 168 hours. Studies: Mr Cervical Spine W Wo Contrast  Result Date: 07/08/2018 CLINICAL DATA:  Back pain.  Rule out spinal infection. EXAM: MRI TOTAL SPINE WITHOUT AND WITH CONTRAST TECHNIQUE: Multisequence MR imaging of the spine from the cervical spine to the sacrum was performed prior to and following IV contrast administration for evaluation of spinal metastatic disease. CONTRAST:  7 mL Gadovist IV COMPARISON:  CT chest 07/06/2018, CT total spine I will 04/2018 FINDINGS: MRI was done under general anesthesia. MRI CERVICAL SPINE FINDINGS Alignment: Normal alignment. Moderate kyphosis of the cervical spine Vertebrae: Negative for fracture or mass. No evidence of cervical discitis. Multilevel degenerative change. ACDF C6-7 with anterior plate Cord: Mild hyperintensity in the cord at C6-7 as noted on the prior study. No cord compression. Posterior Fossa, vertebral arteries, paraspinal tissues: The patient is intubated. Fluid in the pharynx. No abscess or mass in the neck. Disc levels: Advanced multilevel disc and facet degeneration throughout the cervical spine. No cord compression or significant spinal stenosis. Severe left foraminal narrowing due to spurring at C2-3, C3-4, and C4-5 MRI THORACIC SPINE FINDINGS Alignment:  Normal Vertebrae: Negative for fracture. Hemangioma T8 vertebral body unchanged. Bone marrow diffusely low signal on T1 and T2-T2 related to anemia. Recent IV iron therapy. Cord: Normal spinal cord signal. Posterior subdural or epidural fluid collection posteriorly from approximately T6 through  T10. Given the findings of disc space infection in the lower thoracic spine, this is most likely abscess. No significant cord deformity. Paraspinal and other soft tissues: Moderately large loculated left pleural effusion. Small right effusion. Paraspinous fluid collections bilaterally at the T12 level most likely paraspinous abscesses. Disc levels: Disc space narrowing, endplate erosion, and endplate enhancement at T10-11, T11-T12, T12-L1 compatible with discitis and osteomyelitis. Marked degenerative changes of these levels with mild spinal stenosis at T10-11, moderate spinal  stenosis at T11-12 and T12-L1. Extensive foraminal encroachment bilaterally T11-12 and T12-L1 due to spurring. There is circumferential epidural thickening and T11 and T12 which is likely due to epidural abscess. MRI LUMBAR SPINE FINDINGS Segmentation:  Normal Alignment:  Normal Vertebrae: Diffusely abnormal bone marrow which is low signal on T1 and T2 compatible with chronic anemia. Recent IV iron infusion therapy. Conus medullaris: Extends to the T12-L1 level. Moderate spinal stenosis at T11-12 and T12-L1 due to spondylosis and soft tissue thickening in the epidural space related to disc space infection. No definite signal abnormality in the conus medullaris. Paraspinal and other soft tissues: Paraspinous fluid collections bilaterally at T12-L1 extending into the proximal psoas muscle compatible with abscesses Disc levels: Discitis and osteomyelitis at T11-12, T12-L1, L1-2, L4-5. These levels show disc space edema, enhancement and endplate erosions. Erosion most severe at T12-L1. Circumferential epidural thickening and enhancement at T11 and T12 due to abscess. Multilevel spondylosis throughout the lumbar spine. Spinal and foraminal stenosis is seen throughout the lumbar spine most severe at L4-5 where there is moderate to advanced spinal stenosis. IMPRESSION: 1. Negative for infection in the cervical spine. Extensive multilevel spondylosis  without significant spinal stenosis. Marked left foraminal narrowing at C2-3, C3-4, C4-5 due to spurring. 2. 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 3. Paraspinous abscesses bilaterally at T11 and T12 extending into the psoas muscle bilaterally. Loculated left effusion most likely infected. 4. Posterior subdural fluid collection extending from T6 through T10 compatible with abscess. 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 5. Moderate spinal stenosis due to degenerative change throughout the lower thoracic and entire lumbar spine. Moderate stenosis of the canal at T11-T12 and T12-L1. Moderate to severe stenosis at L4-5. Electronically Signed   By: Marlan Palauharles  Clark M.D.   On: 07/08/2018 15:04   Mr Thoracic Spine W Wo Contrast  Result Date: 07/08/2018 CLINICAL DATA:  Back pain.  Rule out spinal infection. EXAM: MRI TOTAL SPINE WITHOUT AND WITH CONTRAST TECHNIQUE: Multisequence MR imaging of the spine from the cervical spine to the sacrum was performed prior to and following IV contrast administration for evaluation of spinal metastatic disease. CONTRAST:  7 mL Gadovist IV COMPARISON:  CT chest 07/06/2018, CT total spine I will 04/2018 FINDINGS: MRI was done under general anesthesia. MRI CERVICAL SPINE FINDINGS Alignment: Normal alignment. Moderate kyphosis of the cervical spine Vertebrae: Negative for fracture or mass. No evidence of cervical discitis. Multilevel degenerative change. ACDF C6-7 with anterior plate Cord: Mild hyperintensity in the cord at C6-7 as noted on the prior study. No cord compression. Posterior Fossa, vertebral arteries, paraspinal tissues: The patient is intubated. Fluid in the pharynx. No abscess or mass in the neck. Disc levels: Advanced multilevel disc and facet degeneration throughout the cervical spine. No cord compression or significant spinal stenosis. Severe left foraminal narrowing due to spurring at C2-3, C3-4, and C4-5 MRI THORACIC SPINE  FINDINGS Alignment:  Normal Vertebrae: Negative for fracture. Hemangioma T8 vertebral body unchanged. Bone marrow diffusely low signal on T1 and T2-T2 related to anemia. Recent IV iron therapy. Cord: Normal spinal cord signal. Posterior subdural or epidural fluid collection posteriorly from approximately T6 through T10. Given the findings of disc space infection in the lower thoracic spine, this is most likely abscess. No significant cord deformity. Paraspinal and other soft tissues: Moderately large loculated left pleural effusion. Small right effusion. Paraspinous fluid collections bilaterally at the T12 level most likely paraspinous abscesses. Disc levels: Disc space narrowing, endplate erosion, and  endplate enhancement at T10-11, T11-T12, T12-L1 compatible with discitis and osteomyelitis. Marked degenerative changes of these levels with mild spinal stenosis at T10-11, moderate spinal stenosis at T11-12 and T12-L1. Extensive foraminal encroachment bilaterally T11-12 and T12-L1 due to spurring. There is circumferential epidural thickening and T11 and T12 which is likely due to epidural abscess. MRI LUMBAR SPINE FINDINGS Segmentation:  Normal Alignment:  Normal Vertebrae: Diffusely abnormal bone marrow which is low signal on T1 and T2 compatible with chronic anemia. Recent IV iron infusion therapy. Conus medullaris: Extends to the T12-L1 level. Moderate spinal stenosis at T11-12 and T12-L1 due to spondylosis and soft tissue thickening in the epidural space related to disc space infection. No definite signal abnormality in the conus medullaris. Paraspinal and other soft tissues: Paraspinous fluid collections bilaterally at T12-L1 extending into the proximal psoas muscle compatible with abscesses Disc levels: Discitis and osteomyelitis at T11-12, T12-L1, L1-2, L4-5. These levels show disc space edema, enhancement and endplate erosions. Erosion most severe at T12-L1. Circumferential epidural thickening and enhancement  at T11 and T12 due to abscess. Multilevel spondylosis throughout the lumbar spine. Spinal and foraminal stenosis is seen throughout the lumbar spine most severe at L4-5 where there is moderate to advanced spinal stenosis. IMPRESSION: 1. Negative for infection in the cervical spine. Extensive multilevel spondylosis without significant spinal stenosis. Marked left foraminal narrowing at C2-3, C3-4, C4-5 due to spurring. 2. 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 3. Paraspinous abscesses bilaterally at T11 and T12 extending into the psoas muscle bilaterally. Loculated left effusion most likely infected. 4. Posterior subdural fluid collection extending from T6 through T10 compatible with abscess. 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 5. Moderate spinal stenosis due to degenerative change throughout the lower thoracic and entire lumbar spine. Moderate stenosis of the canal at T11-T12 and T12-L1. Moderate to severe stenosis at L4-5. Electronically Signed   By: Marlan Palau M.D.   On: 07/08/2018 15:04   Mr Lumbar Spine W Wo Contrast  Result Date: 07/08/2018 CLINICAL DATA:  Back pain.  Rule out spinal infection. EXAM: MRI TOTAL SPINE WITHOUT AND WITH CONTRAST TECHNIQUE: Multisequence MR imaging of the spine from the cervical spine to the sacrum was performed prior to and following IV contrast administration for evaluation of spinal metastatic disease. CONTRAST:  7 mL Gadovist IV COMPARISON:  CT chest 07/06/2018, CT total spine I will 04/2018 FINDINGS: MRI was done under general anesthesia. MRI CERVICAL SPINE FINDINGS Alignment: Normal alignment. Moderate kyphosis of the cervical spine Vertebrae: Negative for fracture or mass. No evidence of cervical discitis. Multilevel degenerative change. ACDF C6-7 with anterior plate Cord: Mild hyperintensity in the cord at C6-7 as noted on the prior study. No cord compression. Posterior Fossa, vertebral arteries, paraspinal tissues: The  patient is intubated. Fluid in the pharynx. No abscess or mass in the neck. Disc levels: Advanced multilevel disc and facet degeneration throughout the cervical spine. No cord compression or significant spinal stenosis. Severe left foraminal narrowing due to spurring at C2-3, C3-4, and C4-5 MRI THORACIC SPINE FINDINGS Alignment:  Normal Vertebrae: Negative for fracture. Hemangioma T8 vertebral body unchanged. Bone marrow diffusely low signal on T1 and T2-T2 related to anemia. Recent IV iron therapy. Cord: Normal spinal cord signal. Posterior subdural or epidural fluid collection posteriorly from approximately T6 through T10. Given the findings of disc space infection in the lower thoracic spine, this is most likely abscess. No significant cord deformity. Paraspinal and other soft tissues: Moderately large loculated left  pleural effusion. Small right effusion. Paraspinous fluid collections bilaterally at the T12 level most likely paraspinous abscesses. Disc levels: Disc space narrowing, endplate erosion, and endplate enhancement at T10-11, T11-T12, T12-L1 compatible with discitis and osteomyelitis. Marked degenerative changes of these levels with mild spinal stenosis at T10-11, moderate spinal stenosis at T11-12 and T12-L1. Extensive foraminal encroachment bilaterally T11-12 and T12-L1 due to spurring. There is circumferential epidural thickening and T11 and T12 which is likely due to epidural abscess. MRI LUMBAR SPINE FINDINGS Segmentation:  Normal Alignment:  Normal Vertebrae: Diffusely abnormal bone marrow which is low signal on T1 and T2 compatible with chronic anemia. Recent IV iron infusion therapy. Conus medullaris: Extends to the T12-L1 level. Moderate spinal stenosis at T11-12 and T12-L1 due to spondylosis and soft tissue thickening in the epidural space related to disc space infection. No definite signal abnormality in the conus medullaris. Paraspinal and other soft tissues: Paraspinous fluid collections  bilaterally at T12-L1 extending into the proximal psoas muscle compatible with abscesses Disc levels: Discitis and osteomyelitis at T11-12, T12-L1, L1-2, L4-5. These levels show disc space edema, enhancement and endplate erosions. Erosion most severe at T12-L1. Circumferential epidural thickening and enhancement at T11 and T12 due to abscess. Multilevel spondylosis throughout the lumbar spine. Spinal and foraminal stenosis is seen throughout the lumbar spine most severe at L4-5 where there is moderate to advanced spinal stenosis. IMPRESSION: 1. Negative for infection in the cervical spine. Extensive multilevel spondylosis without significant spinal stenosis. Marked left foraminal narrowing at C2-3, C3-4, C4-5 due to spurring. 2. 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 3. Paraspinous abscesses bilaterally at T11 and T12 extending into the psoas muscle bilaterally. Loculated left effusion most likely infected. 4. Posterior subdural fluid collection extending from T6 through T10 compatible with abscess. 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 5. Moderate spinal stenosis due to degenerative change throughout the lower thoracic and entire lumbar spine. Moderate stenosis of the canal at T11-T12 and T12-L1. Moderate to severe stenosis at L4-5. Electronically Signed   By: Marlan Palau M.D.   On: 07/08/2018 15:04   Ct Image Guided Drainage By Percutaneous Catheter  Result Date: 07/08/2018 INDICATION: 68 year old with bacteremia, pneumonia and loculated left pleural effusion. Request for a chest tube placement with image guidance. EXAM: CT-GUIDED PLACEMENT OF LEFT PLEURAL DRAIN MEDICATIONS: No antibiotics given for this procedure. ANESTHESIA/SEDATION: 2.0 mg IV Versed 50 mcg IV Fentanyl Moderate Sedation Time:  24 minutes The patient was continuously monitored during the procedure by the interventional radiology nurse under my direct supervision. COMPLICATIONS: None immediate.  TECHNIQUE: Informed written consent was obtained from the patient's wife after a thorough discussion of the procedural risks, benefits and alternatives. All questions were addressed. Maximal Sterile Barrier Technique was utilized including caps, mask, sterile gowns, sterile gloves, sterile drape, hand hygiene and skin antiseptic. A timeout was performed prior to the initiation of the procedure. PROCEDURE: Patient was placed on his right side. CT images through the chest were obtained. The left pleural effusion was also evaluated with ultrasound and one of the larger fluid pockets was targeted. Skin was prepped with chlorhexidine and sterile field was created. 18 gauge trocar needle was directed into the pleural space with CT guidance. Amber fluid was aspirated. Stiff Amplatz wire was advanced into the pleural space. Tract was dilated to accommodate a 14 [place] multipurpose drain. Catheter was sutured to skin and attached to a PleurEvac. Greater than 100 mL of amber colored fluid was removed when the  catheter was placed to suction. Dressing was placed over the drain. FINDINGS: Complex left pleural effusion. Fluid is very loculated by ultrasound. Chest tube was placed along the left posterior aspect of the complex effusion. Greater than 100 mL of amber colored fluid was removed. IMPRESSION: Successful placement a left pleural drain with CT guidance. Electronically Signed   By: Richarda Overlie M.D.   On: 07/08/2018 17:52     Time spent: 35 minutes  Author: Lynden Oxford, MD [facility] 07/08/2018 6:48 PM  Between 7PM-7AM, please contact night-coverage at [email or web address]

## 2018-07-08 NOTE — Anesthesia Procedure Notes (Signed)
Procedure Name: Intubation Date/Time: 07/08/2018 11:21 AM Performed by: Elayne Snare, CRNA Pre-anesthesia Checklist: Patient identified, Emergency Drugs available, Suction available and Patient being monitored Patient Re-evaluated:Patient Re-evaluated prior to induction Oxygen Delivery Method: Circle System Utilized Preoxygenation: Pre-oxygenation with 100% oxygen Induction Type: IV induction Laryngoscope Size: Mac and 4 Grade View: Grade I Tube type: Oral Tube size: 7.5 mm Number of attempts: 1 Airway Equipment and Method: Stylet Placement Confirmation: ETT inserted through vocal cords under direct vision,  positive ETCO2 and breath sounds checked- equal and bilateral Secured at: 21 cm Tube secured with: Tape Dental Injury: Teeth and Oropharynx as per pre-operative assessment

## 2018-07-08 NOTE — Anesthesia Postprocedure Evaluation (Signed)
Anesthesia Post Note  Patient: Kirkland HunJames M Wynn  Procedure(s) Performed: MRI WITH ANESTHESIA/CERVICAL THORASTIC/LUMBAR SPINE WITH AND WITHOUT CONTRAST (N/A )     Patient location during evaluation: PACU Anesthesia Type: General Level of consciousness: awake and alert Pain management: pain level controlled Vital Signs Assessment: post-procedure vital signs reviewed and stable Respiratory status: spontaneous breathing, nonlabored ventilation and respiratory function stable Cardiovascular status: blood pressure returned to baseline and stable Postop Assessment: no apparent nausea or vomiting Anesthetic complications: no    Last Vitals:  Vitals:   07/08/18 0222 07/08/18 0817  BP: 114/77 109/76  Pulse:  87  Resp: 16 18  Temp: 36.8 C 36.9 C  SpO2: 90% 100%    Last Pain:  Vitals:   07/08/18 0817  TempSrc: Oral  PainSc: 0-No pain                 Laqueshia Cihlar A.

## 2018-07-08 NOTE — Sedation Documentation (Signed)
Patient is resting

## 2018-07-08 NOTE — Consult Note (Signed)
Chief Complaint: Patient was seen in consultation today for left chest tube drain placement Chief Complaint  Patient presents with  . Abnormal Lab   at the request of Dr Curley Spice  Supervising Physician: Richarda Overlie  Patient Status: [facility] - In-pt  History of Present Illness: Wayne Buckley is a 68 y.o. male   Hx TBI- lives in SNF MRSA bacteremia Neurogenic bladder- chronic indwelling catheter Admitted 1/17 with fatigue; anemia; fever MRSA BC  Lt PNA; loculated effusion Thoracentesis 1/18: 90 cc- loculated effusion Osteomyelitis Tspine On Eliquis-- LD 1/18  CT 1/18:  IMPRESSION: No evidence of pulmonary emboli. Large loculated left pleural effusion with associated upper and lower lobe consolidation. Small right pleural effusion and basilar atelectasis. Considerable erosive changes at T11-T12 as well as T12-L1 with surrounding soft tissue change highly suspicious for diskitis and underlying osteomyelitis. MRI would be helpful for further evaluation. Some suggestion of soft tissue encroachment into the spinal canal is noted at T11-T12. These changes are new from the prior CT examination from 2 months previous.  Consult per CPPM-- Dr Kendrick Fries Asking for pigtail chest tube drain (may need TPA for effusion)  Dr Lowella Dandy has reviewed imaging and approves procedure  Past Medical History:  Diagnosis Date  . BPH (benign prostatic hyperplasia)   . DVT (deep venous thrombosis) (HCC) 02/2018   extensive LLE DVT  . H/O alcohol abuse   . Hypertension   . Neurogenic bowel   . Spinal cord injury, cervical region (HCC) 02/2018  . TBI (traumatic brain injury) (HCC) 2013   SDH with left partial frontal lobectomy    Past Surgical History:  Procedure Laterality Date  . ANTERIOR CERVICAL DECOMP/DISCECTOMY FUSION N/A 02/27/2018   Procedure: ANTERIOR CERVICAL DECOMPRESSION/DISCECTOMY FUSION C6-7;  Surgeon: Donalee Citrin, MD;  Location: [facility] OR;  Service: Neurosurgery;  Laterality: N/A;  .  CRANIOTOMY  04/28/2012   Procedure: CRANIOTOMY HEMATOMA EVACUATION SUBDURAL;  Surgeon: Reinaldo Meeker, MD;  Location: [facility] OR;  Service: Neurosurgery;  Laterality: Left;  Left Craniotomy for Subdural Hematoma  . IR FLUORO GUIDE CV LINE RIGHT  05/14/2018  . IR US GUIDE VASC ACCESS RIGHT  05/14/2018  . RADIOLOGY WITH ANESTHESIA N/A 02/27/2018   Procedure: MRI WITH ANESTHESIA;  Surgeon: Radiologist, Medication, MD;  Location: [facility] OR;  Service: Radiology;  Laterality: N/A;  . VENA CAVA FILTER PLACEMENT Right 02/27/2018   Procedure: INSERTION VENA-CAVA FILTER;  Surgeon: Nada Libman, MD;  Location: [facility] OR;  Service: Vascular;  Laterality: Right;    Allergies: Patient has no known allergies.  Medications: Prior to Admission medications   Medication Sig Start Date End Date Taking? Authorizing Provider  acetaminophen (TYLENOL) 325 MG tablet Take 2 tablets (650 mg total) by mouth every 6 (six) hours as needed for mild pain. 04/03/18  Yes Angiulli, Mcarthur Rossetti, PA-C  amLODipine (NORVASC) 5 MG tablet Take 5 mg by mouth daily.   Yes [provider]  apixaban (ELIQUIS) 5 MG TABS tablet Take 1 tablet (5 mg total) by mouth 2 (two) times daily. 04/03/18  Yes Angiulli, Mcarthur Rossetti, PA-C  bisacodyl (DULCOLAX) 10 MG suppository Place 1 suppository (10 mg total) rectally daily at 6 (six) AM. Patient taking differently: Place 10 mg rectally daily as needed for mild constipation.  04/03/18  Yes Angiulli, Mcarthur Rossetti, PA-C  carvedilol (COREG) 12.5 MG tablet Take 1 tablet (12.5 mg total) by mouth 2 (two) times daily with a meal. 04/03/18  Yes Angiulli, Mcarthur Rossetti, PA-C  ferrous sulfate 325 (65  FE) MG tablet Take 1 tablet (325 mg total) by mouth 2 (two) times daily with a meal. 05/31/18  Yes Rizwan, Ladell Heads, MD  liver oil-zinc oxide (DESITIN) 40 % ointment Apply topically 4 (four) times daily. Apply to sacrum, coccyx, buttocks. Patient taking differently: Apply 1 application topically 4 (four) times daily. Apply to  sacrum, coccyx, buttocks. 05/14/18  Yes Rai, Ripudeep K, MD  polyethylene glycol (MIRALAX / GLYCOLAX) packet Take 17 g by mouth daily as needed for mild constipation or moderate constipation. 05/31/18  Yes Rizwan, Ladell Heads, MD  senna-docusate (SENOKOT-S) 8.6-50 MG tablet Take 1 tablet by mouth 2 (two) times daily. Patient taking differently: Take 1 tablet by mouth 2 (two) times daily as needed (constipation).  05/14/18  Yes Rai, Ripudeep K, MD  sodium phosphate (FLEET) 7-19 GM/118ML ENEM Place 133 mLs (1 enema total) rectally daily as needed for severe constipation. 05/31/18  Yes Calvert Cantor, MD  tamsulosin (FLOMAX) 0.4 MG CAPS capsule Take 1 capsule (0.4 mg total) by mouth daily. 03/06/18  Yes Leroy Sea, MD  vitamin C (VITAMIN C) 1000 MG tablet Take 1 tablet (1,000 mg total) by mouth 2 (two) times daily. 04/03/18  Yes Angiulli, Mcarthur Rossetti, PA-C  amLODipine (NORVASC) 10 MG tablet Take 1 tablet (10 mg total) by mouth daily. Patient not taking: Reported on 07/05/2018 03/06/18   Leroy Sea, MD  collagenase (SANTYL) ointment Apply topically daily. Cleanse gluteal wounds with NS.  Apply Santyl to open areas.  Cover with NS moist 2x2 and foam dressings.  Peel back foam and change daily. Foam is changed every three days and PRN soilage. Patient not taking: Reported on 07/05/2018 05/15/18   Rai, Delene Ruffini, MD  levofloxacin (LEVAQUIN) 750 MG tablet Take 750 mg by mouth daily. For 7 days. Start date 06/25/2018. End date 07/01/2018    [provider]  polyethylene glycol (MIRALAX / GLYCOLAX) packet Take 17 g by mouth 2 (two) times daily. Patient not taking: Reported on 07/05/2018 05/14/18   Cathren Harsh, MD     Family History  Problem Relation Age of Onset  . Heart disease Father     Social History   Socioeconomic History  . Marital status: Married    Spouse name: Not on file  . Number of children: Not on file  . Years of education: Not on file  . Highest education level: Not on  file  Occupational History  . Occupation: retired Counsellor  . Financial resource strain: Not on file  . Food insecurity:    Worry: Not on file    Inability: Not on file  . Transportation needs:    Medical: Not on file    Non-medical: Not on file  Tobacco Use  . Smoking status: Never Smoker  . Smokeless tobacco: Never Used  Substance and Sexual Activity  . Alcohol use: Yes    Comment: daily   . Drug use: Not on file  . Sexual activity: Not on file  Lifestyle  . Physical activity:    Days per week: Not on file    Minutes per session: Not on file  . Stress: Not on file  Relationships  . Social connections:    Talks on phone: Not on file    Gets together: Not on file    Attends religious service: Not on file    Active member of club or organization: Not on file    Attends meetings of clubs or organizations: Not on file  Relationship status: Not on file  Other Topics Concern  . Not on file  Social History Narrative  . Not on file    Review of Systems: A 12 point ROS discussed and pertinent positives are indicated in the HPI above.  All other systems are negative.  Review of Systems  Constitutional: Positive for activity change, fatigue and fever.  Respiratory: Positive for cough and shortness of breath.   Cardiovascular: Negative for chest pain.  Neurological: Positive for weakness.  Psychiatric/Behavioral: Positive for behavioral problems, confusion and decreased concentration. Negative for agitation.    Vital Signs: BP 109/76 (BP Location: Left Arm)   Pulse 87   Temp 98.4 F (36.9 C) (Oral)   Resp 18   Ht 5\' 9"  (1.753 m)   Wt 174 lb 6.1 oz (79.1 kg)   SpO2 100%   BMI 25.75 kg/m   Physical Exam Vitals signs reviewed.  Cardiovascular:     Rate and Rhythm: Normal rate and regular rhythm.  Pulmonary:     Breath sounds: Wheezing present.  Abdominal:     General: Bowel sounds are normal.  Musculoskeletal: Normal range of motion.  Skin:     General: Skin is warm and dry.  Neurological:     Comments: Does not follow commands  Psychiatric:     Comments: Spoke to wife on phone Arvella NighDenise Shiel consents for procedure     Imaging: Dg Chest 1 View  Result Date: 07/06/2018 CLINICAL DATA:  Status post left thoracentesis. EXAM: CHEST  1 VIEW COMPARISON:  07/05/2018 FINDINGS: There is persistent left mid to lower lung zone opacity consistent with combination of pleural fluid and atelectasis or pneumonia. No convincing pneumothorax. Right lung is clear. IMPRESSION: 1. By report, only 90 mL of fluid recovered at thoracentesis. There is no radiographic change in the appearance of the left lung. 2. No convincing pneumothorax. Electronically Signed   By: Amie Portlandavid  Ormond M.D.   On: 07/06/2018 12:37   Dg Chest 2 View  Result Date: 07/05/2018 CLINICAL DATA:  68 y/o  M; anemia. Fever. EXAM: CHEST - 2 VIEW COMPARISON:  05/02/2018 chest radiograph. FINDINGS: Large left pleural effusion. Left mid and lower lung zone opacities. Cardiac silhouette partially obscured by the effusion. Aortic calcific atherosclerosis. ACDF hardware noted. Clear right lung. No pneumothorax. Bones are unremarkable. IVC filter in situ. IMPRESSION: Large left pleural effusion. Left mid and lower lung zone opacities may represent associated atelectasis or pneumonia. Electronically Signed   By: Mitzi HansenLance  Furusawa-Stratton M.D.   On: 07/05/2018 22:26   Ct Angio Chest Pe W Or Wo Contrast  Result Date: 07/06/2018 CLINICAL DATA:  Difficulty breathing, positive D-dimer EXAM: CT ANGIOGRAPHY CHEST WITH CONTRAST TECHNIQUE: Multidetector CT imaging of the chest was performed using the standard protocol during bolus administration of intravenous contrast. Multiplanar CT image reconstructions and MIPs were obtained to evaluate the vascular anatomy. CONTRAST:  100mL ISOVUE-370 IOPAMIDOL (ISOVUE-370) INJECTION 76% COMPARISON:  07/06/2018, CT of the thoracic spine from 02/25/2018, CT of the abdomen dated  05/02/2018 FINDINGS: Cardiovascular: Thoracic aorta is well visualize with a normal branching pattern. No aneurysmal dilatation or dissection is seen. Coronary calcifications as well as aortic atherosclerotic calcifications are noted. The pulmonary artery shows a normal branching pattern. No definitive pulmonary emboli are noted. Mediastinum/Nodes: Thoracic inlet is within normal limits. Multiple calcified hilar and mediastinal lymph nodes are noted consistent with prior granulomatous disease. The esophagus is well visualized and within normal limits. Lungs/Pleura: On the left there is considerable upper lobe and lower  lobe consolidation with associated underlying effusion. This is loculated in nature based on recent ultrasound examination from earlier in the same day. No post thoracentesis pneumothorax is seen. The right lung is well expanded with minimal lower lobe atelectatic changes as well as small effusion. A 3 mm nodule is noted in the upper lobe best seen on image number 29 of series 6. This is stable from the recent CT of the thoracic spine. No other definitive nodules are seen. Upper Abdomen: Visualized upper abdomen shows no acute abnormality. Musculoskeletal: Postoperative changes in the cervical spine are noted. Multilevel degenerative changes in the thoracic spine are seen. There are progressive significant erosive changes identified at T11-T12 and T12-L1. These changes are new from the recent CT from September of 2019 and are highly suspicious for diskitis and underlying osteomyelitis. Considerable surrounding soft tissue prominence is noted related to the underlying inflammatory change. There is some suggestion of encroachment in the spinal canal related to the changes at T11-T12. Review of the MIP images confirms the above findings. IMPRESSION: No evidence of pulmonary emboli. Large loculated left pleural effusion with associated upper and lower lobe consolidation. Small right pleural effusion and  basilar atelectasis. Considerable erosive changes at T11-T12 as well as T12-L1 with surrounding soft tissue change highly suspicious for diskitis and underlying osteomyelitis. MRI would be helpful for further evaluation. Some suggestion of soft tissue encroachment into the spinal canal is noted at T11-T12. These changes are new from the prior CT examination from 2 months previous. Critical Value/emergent results were called by telephone at the time of interpretation on 07/06/2018 at 4:58 pm to Dr. Lynden OxfordPRANAV PATEL , who verbally acknowledged these results. Aortic Atherosclerosis (ICD10-I70.0). Electronically Signed   By: Alcide CleverMark  Lukens M.D.   On: 07/06/2018 17:00   [place] Thoracentesis Asp Pleural Space W/img Guide  Result Date: 07/06/2018 INDICATION: Patient with history of chronic anemia, TBI, DVT now admitted for pneumonia - found to have pleural effusion. Request for diagnostic and therapeutic thoracentesis today/ EXAM: ULTRASOUND GUIDED LEFT THORACENTESIS MEDICATIONS: 8 mL 1% lidocaine. COMPLICATIONS: None immediate. PROCEDURE: An ultrasound guided thoracentesis was thoroughly discussed with the patient's wife and questions answered. The benefits, risks, alternatives and complications were also discussed. The patient's wife understands and wishes to proceed with the procedure. Written consent was obtained. Ultrasound was performed to localize and mark an adequate pocket of fluid in the left chest. The area was then prepped and draped in the normal sterile fashion. 1% Lidocaine was used for local anesthesia. Under ultrasound guidance a 6 Fr Safe-T-Centesis catheter was introduced. Thoracentesis was performed. The catheter was removed and a dressing applied. FINDINGS: A total of approximately 90 mL of serosanguineous fluid was removed. Pleural fluid is very loculated on ultrasound, unable to aspirate additional fluid after several attempts at catheter repositioning. Residual fluid remains on post procedure ultrasound  examination. Samples were sent to the laboratory as requested by the clinical team. IMPRESSION: Successful ultrasound guided left thoracentesis yielding 90 mL of pleural fluid. Loculated left pleural effusion. Read by Lynnette CaffeyShannon Watterson, PA-C Electronically Signed   By: Richarda OverlieAdam  Henn M.D.   On: 07/06/2018 12:26    Labs:  CBC: Recent Labs    05/29/18 0448 07/05/18 1918 07/06/18 0218 07/06/18 0737 07/07/18 0835  WBC 8.3 18.4* 15.2*  --  14.2*  HGB 8.8* 7.2* 7.7* 9.3* 9.3*  HCT 28.3* 23.3* 24.6* 29.1* 29.9*  PLT 590* 776* 666*  --  641*    COAGS: Recent Labs    02/25/18 1413  05/10/18 1610 05/11/18 0615 05/12/18 0628 05/13/18 0503 07/05/18 1918  INR 1.16  --   --   --   --   --  1.89  APTT  --    < > 54* 53* 51* 54*  --    < > = values in this interval not displayed.    BMP: Recent Labs    07/05/18 1918 07/06/18 0218 07/07/18 0835 07/08/18 0301  NA 138 139 137 139  K 3.6 3.0* 3.7 3.8  CL 103 105 106 106  CO2 23 24 22 25   GLUCOSE 111* 99 96 84  BUN 22 18 10  7*  CALCIUM 9.3 8.6* 8.4* 8.4*  CREATININE 0.90 0.83 0.65 0.70  GFRNONAA >60 >60 >60 >60  GFRAA >60 >60 >60 >60    LIVER FUNCTION TESTS: Recent Labs    05/02/18 1402 05/17/18 0659 07/05/18 1918 07/07/18 0835  BILITOT 0.5 0.5 0.7 0.4  AST 83* 20 26 19   ALT 36 16 11 9   ALKPHOS 67 78 96 97  PROT 6.1* 6.2* 6.3* 5.5*  ALBUMIN 2.3* 2.0* 1.8* 1.4*    TUMOR MARKERS: No results for input(s): AFPTM, CEA, CA199, CHROMGRNA in the last 8760 hours.  Assessment and Plan:  Left loculated pleural effusion PNA Fever; leukocytosis Bacteremia Scheduled for left pigtail chest tube drain placement Risks and benefits discussed with wife Angelique Blonder via phone including bleeding, infection, damage to adjacent structures, pneumothorax, and sepsis.  All of the patients wife questions were answered, she is agreeable to proceed. Consent signed and in chart.   Thank you for this interesting consult.  I greatly enjoyed  meeting Wayne Buckley and look forward to participating in their care.  A copy of this report was sent to the requesting provider on this date.  Electronically Signed: Robet Leu, PA-C 07/08/2018, 9:32 AM   I spent a total of 40 Minutes    in face to face in clinical consultation, greater than 50% of which was counseling/coordinating care for left chest tube drain

## 2018-07-08 NOTE — Sedation Documentation (Signed)
Pt sleeping. 

## 2018-07-08 NOTE — Procedures (Addendum)
Interventional Radiology Procedure:   Indications: Loculated left pleural effusion and bacteremia  Procedure: CT guided left chest tube placement  Findings: Very loculated pleural fluid.  14 Fr drain placed in along posterior left pleural space.  Approximately 100 ml of amber fluid removed.    Complications: None     EBL: Minimal  Plan: Drain to wall suction, -20 cm.     Akari Crysler R. Lowella Dandy, MD  Pager: [phone]

## 2018-07-08 NOTE — Transfer of Care (Signed)
Immediate Anesthesia Transfer of Care Note  Patient: Wayne Buckley  Procedure(s) Performed: MRI WITH ANESTHESIA/CERVICAL THORASTIC/LUMBAR SPINE WITH AND WITHOUT CONTRAST (N/A )  Patient Location: 2W16  Anesthesia Type:General  Level of Consciousness: awake, alert  and patient cooperative  Airway & Oxygen Therapy: Patient Spontanous Breathing and Patient connected to nasal cannula oxygen  Post-op Assessment: Report given to RN and Post -op Vital signs reviewed and stable  Post vital signs: Reviewed and stable  Last Vitals:  Vitals Value Taken Time  BP    Temp    Pulse 95 07/08/2018  2:58 PM  Resp 20 07/08/2018  2:58 PM  SpO2 95 % 07/08/2018  2:58 PM  Vitals shown include unvalidated device data.  Last Pain:  Vitals:   07/08/18 0817  TempSrc: Oral  PainSc: 0-No pain         Complications: No apparent anesthesia complications

## 2018-07-08 NOTE — Sedation Documentation (Signed)
Patient is resting comfortably. 

## 2018-07-08 NOTE — Progress Notes (Signed)
RN verified the presence of a signed informed consent that matches stated procedure by patient. Verified armband matches patient's stated name and birth date. Verified NPO status and that all jewelry, contact, glasses, dentures, and partials had been removed (if applicable).  

## 2018-07-08 NOTE — Progress Notes (Signed)
SLP Cancellation Note  Patient Details Name: Wayne Buckley MRN: [id number] DOB: September 25, 1950   Cancelled treatment:        Attempted to see pt for swallow evaluation.  Pt still off floor for procedure at time of SLP attempt.  Will reattempt as SLP schedule permits   Kerrie Pleasure, MA, CCC-SLP Acute Rehabilitation Services Office: [phone] 07/08/2018, 3:11 PM

## 2018-07-08 NOTE — Consult Note (Signed)
Reason for Consult: Discitis osteomyelitis Referring Physician: Dr. Freeman CaldronPatel  Wayne Jory SimsM [name]  HPI: Wayne Buckley is a 68 year old individual who has had a cervical spinal cord injury and the fall 2019.  He required an anterior decompression arthrodesis at the level of C6-C7.  He has a history of traumatic brain injury from 2013.  He had a prolonged course of rehabilitation and during this time he had developed an MRSA bacteremia.  He was hospitalized for this in the late fall and winter months and had been completing a course of rehabilitation at a skilled nursing facility when he developed increasing back pain and febrile episodes and was admitted just recently with evidence of MRSA sepsis and a pleural effusion that appears infected.  He also had developed increasing back pain and a recent MRI demonstrates the presence of thoracolumbar osteomyelitis and discitis.  He is now seen in consultation for any surgical evaluation he may need.  Past Medical History:  Diagnosis Date  . BPH (benign prostatic hyperplasia)   . DVT (deep venous thrombosis) (HCC) 02/2018   extensive LLE DVT  . H/O alcohol abuse   . Hypertension   . Neurogenic bowel   . Spinal cord injury, cervical region (HCC) 02/2018  . TBI (traumatic brain injury) (HCC) 2013   SDH with left partial frontal lobectomy    Past Surgical History:  Procedure Laterality Date  . ANTERIOR CERVICAL DECOMP/DISCECTOMY FUSION N/A 02/27/2018   Procedure: ANTERIOR CERVICAL DECOMPRESSION/DISCECTOMY FUSION C6-7;  Surgeon: Donalee Buckley, Gary, MD;  [facility];  Service: Neurosurgery;  Laterality: N/A;  . CRANIOTOMY  04/28/2012   Procedure: CRANIOTOMY HEMATOMA EVACUATION SUBDURAL;  Surgeon: Reinaldo Meekerandy O Kritzer, MD;  Location: [facility] OR;  Service: Neurosurgery;  Laterality: Left;  Left Craniotomy for Subdural Hematoma  . IR FLUORO GUIDE CV LINE RIGHT  05/14/2018  . IR US GUIDE VASC ACCESS RIGHT  05/14/2018  . RADIOLOGY WITH ANESTHESIA N/A 02/27/2018    Procedure: MRI WITH ANESTHESIA;  Surgeon: Radiologist, Medication, MD;  Location: [facility] OR;  Service: Radiology;  Laterality: N/A;  . VENA CAVA FILTER PLACEMENT Right 02/27/2018   Procedure: INSERTION VENA-CAVA FILTER;  Surgeon: Nada LibmanBrabham, Vance W, MD;  Location: [facility] OR;  Service: Vascular;  Laterality: Right;    Family History  Problem Relation Age of Onset  . Heart disease Father     Social History:  reports that he has never smoked. He has never used smokeless tobacco. He reports current alcohol use. No history on file for drug.  Allergies: No Known Allergies  Medications: I have reviewed the patient's current medications.  Results for orders placed or performed during the hospital encounter of 07/05/18 (from the past 48 hour(s))  Culture, blood (Routine X 2) w Reflex to ID Panel     Status: None (Preliminary result)   Collection Time: 07/06/18  8:17 PM  Result Value Ref Range   Specimen Description BLOOD RIGHT HAND    Special Requests      BOTTLES DRAWN AEROBIC ONLY Blood Culture adequate volume   Culture      NO GROWTH 2 DAYS Performed at [facility], [address] [place], [place] [address]    Report Status PENDING   Culture, blood (Routine X 2) w Reflex to ID Panel     Status: None (Preliminary result)   Collection Time: 07/06/18  8:35 PM  Result Value Ref Range   Specimen Description BLOOD LEFT HAND    Special Requests  BOTTLES DRAWN AEROBIC ONLY Blood Culture adequate volume   Culture      NO GROWTH 2 DAYS Performed at [facility], [address], [place], [place] [address]    Report Status PENDING   Vancomycin, peak     Status: Abnormal   Collection Time: 07/07/18  2:31 AM  Result Value Ref Range   Vancomycin Pk 15 (L) 30 - 40 ug/mL    Comment: Performed at [facility], [address] [place], [place] [address]  CBC with Differential/Platelet     Status: Abnormal   Collection Time: 07/07/18  8:35 AM  Result Value Ref Range   WBC 14.2 (H)  4.0 - 10.5 K/uL   RBC 3.50 (L) 4.22 - 5.81 MIL/uL   Hemoglobin 9.3 (L) 13.0 - 17.0 g/dL   HCT 26.9 (L) 48.5 - 46.2 %   MCV 85.4 80.0 - 100.0 fL   MCH 26.6 26.0 - 34.0 pg   MCHC 31.1 30.0 - 36.0 g/dL   RDW 70.3 (H) 50.0 - 93.8 %   Platelets 641 (H) 150 - 400 K/uL   nRBC 0.0 0.0 - 0.2 %   Neutrophils Relative % 81 %   Neutro Abs 11.5 (H) 1.7 - 7.7 K/uL   Lymphocytes Relative 10 %   Lymphs Abs 1.4 0.7 - 4.0 K/uL   Monocytes Relative 6 %   Monocytes Absolute 0.9 0.1 - 1.0 K/uL   Eosinophils Relative 1 %   Eosinophils Absolute 0.1 0.0 - 0.5 K/uL   Basophils Relative 0 %   Basophils Absolute 0.0 0.0 - 0.1 K/uL   Immature Granulocytes 2 %   Abs Immature Granulocytes 0.24 (H) 0.00 - 0.07 K/uL    Comment: Performed at [facility], [address], [place], [place] [address]  Comprehensive metabolic panel     Status: Abnormal   Collection Time: 07/07/18  8:35 AM  Result Value Ref Range   Sodium 137 135 - 145 mmol/L   Potassium 3.7 3.5 - 5.1 mmol/L    Comment: DELTA CHECK NOTED   Chloride 106 98 - 111 mmol/L   CO2 22 22 - 32 mmol/L   Glucose, Bld 96 70 - 99 mg/dL   BUN 10 8 - 23 mg/dL   Creatinine, Ser 3.71 0.61 - 1.24 mg/dL   Calcium 8.4 (L) 8.9 - 10.3 mg/dL   Total Protein 5.5 (L) 6.5 - 8.1 g/dL   Albumin 1.4 (L) 3.5 - 5.0 g/dL   AST 19 15 - 41 U/L   ALT 9 0 - 44 U/L   Alkaline Phosphatase 97 38 - 126 U/L   Total Bilirubin 0.4 0.3 - 1.2 mg/dL   GFR calc non Af Amer >60 >60 mL/min   GFR calc Af Amer >60 >60 mL/min   Anion gap 9 5 - 15    Comment: Performed at [facility], [address] [place], [place] [address]  Magnesium     Status: None   Collection Time: 07/07/18  8:35 AM  Result Value Ref Range   Magnesium 1.7 1.7 - 2.4 mg/dL    Comment: Performed at [facility], [address] [place], [place] [address]  Vancomycin, trough     Status: Abnormal   Collection Time: 07/07/18 11:04 AM  Result Value Ref Range   Vancomycin Tr 21 (HH) 15 - 20 ug/mL     Comment: CRITICAL RESULT CALLED TO, READ BACK BY AND VERIFIED WITH: Izora Ribas, S RN @  1147 ON 07/07/2018 BY TEMOCHE, H Performed at [facility], [address], [place], [place] [address]   Lactate dehydrogenase     Status: Abnormal   Collection Time: 07/07/18 11:04 AM  Result Value Ref Range   LDH 95 (L) 98 - 192 U/L    Comment: Performed at [facility], [address], [place], [place] [address]  Basic metabolic panel     Status: Abnormal   Collection Time: 07/08/18  3:01 AM  Result Value Ref Range   Sodium 139 135 - 145 mmol/L   Potassium 3.8 3.5 - 5.1 mmol/L   Chloride 106 98 - 111 mmol/L   CO2 25 22 - 32 mmol/L   Glucose, Bld 84 70 - 99 mg/dL   BUN 7 (L) 8 - 23 mg/dL   Creatinine, Ser 8.29 0.61 - 1.24 mg/dL   Calcium 8.4 (L) 8.9 - 10.3 mg/dL   GFR calc non Af Amer >60 >60 mL/min   GFR calc Af Amer >60 >60 mL/min   Anion gap 8 5 - 15    Comment: Performed at [facility], [address] [place], [place] [address]    Mr Cervical Spine W Wo Contrast  Result Date: 07/08/2018 CLINICAL DATA:  Back pain.  Rule out spinal infection. EXAM: MRI TOTAL SPINE WITHOUT AND WITH CONTRAST TECHNIQUE: Multisequence MR imaging of the spine from the cervical spine to the sacrum was performed prior to and following IV contrast administration for evaluation of spinal metastatic disease. CONTRAST:  7 mL Gadovist IV COMPARISON:  CT chest 07/06/2018, CT total spine I will 04/2018 FINDINGS: MRI was done under general anesthesia. MRI CERVICAL SPINE FINDINGS Alignment: Normal alignment. Moderate kyphosis of the cervical spine Vertebrae: Negative for fracture or mass. No evidence of cervical discitis. Multilevel degenerative change. ACDF C6-7 with anterior plate Cord: Mild hyperintensity in the cord at C6-7 as noted on the prior study. No cord compression. Posterior Fossa, vertebral arteries, paraspinal tissues: The patient is intubated. Fluid in the pharynx. No abscess or mass in the neck. Disc  levels: Advanced multilevel disc and facet degeneration throughout the cervical spine. No cord compression or significant spinal stenosis. Severe left foraminal narrowing due to spurring at C2-3, C3-4, and C4-5 MRI THORACIC SPINE FINDINGS Alignment:  Normal Vertebrae: Negative for fracture. Hemangioma T8 vertebral body unchanged. Bone marrow diffusely low signal on T1 and T2-T2 related to anemia. Recent IV iron therapy. Cord: Normal spinal cord signal. Posterior subdural or epidural fluid collection posteriorly from approximately T6 through T10. Given the findings of disc space infection in the lower thoracic spine, this is most likely abscess. No significant cord deformity. Paraspinal and other soft tissues: Moderately large loculated left pleural effusion. Small right effusion. Paraspinous fluid collections bilaterally at the T12 level most likely paraspinous abscesses. Disc levels: Disc space narrowing, endplate erosion, and endplate enhancement at T10-11, T11-T12, T12-L1 compatible with discitis and osteomyelitis. Marked degenerative changes of these levels with mild spinal stenosis at T10-11, moderate spinal stenosis at T11-12 and T12-L1. Extensive foraminal encroachment bilaterally T11-12 and T12-L1 due to spurring. There is circumferential epidural thickening and T11 and T12 which is likely due to epidural abscess. MRI LUMBAR SPINE FINDINGS Segmentation:  Normal Alignment:  Normal Vertebrae: Diffusely abnormal bone marrow which is low signal on T1 and T2 compatible with chronic anemia. Recent IV iron infusion therapy. Conus medullaris: Extends to the T12-L1 level. Moderate spinal stenosis at T11-12 and T12-L1 due to spondylosis and soft tissue  thickening in the epidural space related to disc space infection. No definite signal abnormality in the conus medullaris. Paraspinal and other soft tissues: Paraspinous fluid collections bilaterally at T12-L1 extending into the proximal psoas muscle compatible with  abscesses Disc levels: Discitis and osteomyelitis at T11-12, T12-L1, L1-2, L4-5. These levels show disc space edema, enhancement and endplate erosions. Erosion most severe at T12-L1. Circumferential epidural thickening and enhancement at T11 and T12 due to abscess. Multilevel spondylosis throughout the lumbar spine. Spinal and foraminal stenosis is seen throughout the lumbar spine most severe at L4-5 where there is moderate to advanced spinal stenosis. IMPRESSION: 1. Negative for infection in the cervical spine. Extensive multilevel spondylosis without significant spinal stenosis. Marked left foraminal narrowing at C2-3, C3-4, C4-5 due to spurring. 2. 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 3. Paraspinous abscesses bilaterally at T11 and T12 extending into the psoas muscle bilaterally. Loculated left effusion most likely infected. 4. Posterior subdural fluid collection extending from T6 through T10 compatible with abscess. 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 5. Moderate spinal stenosis due to degenerative change throughout the lower thoracic and entire lumbar spine. Moderate stenosis of the canal at T11-T12 and T12-L1. Moderate to severe stenosis at L4-5. Electronically Signed   By: Marlan Palau M.D.   On: 07/08/2018 15:04   Mr Thoracic Spine W Wo Contrast  Result Date: 07/08/2018 CLINICAL DATA:  Back pain.  Rule out spinal infection. EXAM: MRI TOTAL SPINE WITHOUT AND WITH CONTRAST TECHNIQUE: Multisequence MR imaging of the spine from the cervical spine to the sacrum was performed prior to and following IV contrast administration for evaluation of spinal metastatic disease. CONTRAST:  7 mL Gadovist IV COMPARISON:  CT chest 07/06/2018, CT total spine I will 04/2018 FINDINGS: MRI was done under general anesthesia. MRI CERVICAL SPINE FINDINGS Alignment: Normal alignment. Moderate kyphosis of the cervical spine Vertebrae: Negative for fracture or mass. No evidence of  cervical discitis. Multilevel degenerative change. ACDF C6-7 with anterior plate Cord: Mild hyperintensity in the cord at C6-7 as noted on the prior study. No cord compression. Posterior Fossa, vertebral arteries, paraspinal tissues: The patient is intubated. Fluid in the pharynx. No abscess or mass in the neck. Disc levels: Advanced multilevel disc and facet degeneration throughout the cervical spine. No cord compression or significant spinal stenosis. Severe left foraminal narrowing due to spurring at C2-3, C3-4, and C4-5 MRI THORACIC SPINE FINDINGS Alignment:  Normal Vertebrae: Negative for fracture. Hemangioma T8 vertebral body unchanged. Bone marrow diffusely low signal on T1 and T2-T2 related to anemia. Recent IV iron therapy. Cord: Normal spinal cord signal. Posterior subdural or epidural fluid collection posteriorly from approximately T6 through T10. Given the findings of disc space infection in the lower thoracic spine, this is most likely abscess. No significant cord deformity. Paraspinal and other soft tissues: Moderately large loculated left pleural effusion. Small right effusion. Paraspinous fluid collections bilaterally at the T12 level most likely paraspinous abscesses. Disc levels: Disc space narrowing, endplate erosion, and endplate enhancement at T10-11, T11-T12, T12-L1 compatible with discitis and osteomyelitis. Marked degenerative changes of these levels with mild spinal stenosis at T10-11, moderate spinal stenosis at T11-12 and T12-L1. Extensive foraminal encroachment bilaterally T11-12 and T12-L1 due to spurring. There is circumferential epidural thickening and T11 and T12 which is likely due to epidural abscess. MRI LUMBAR SPINE FINDINGS Segmentation:  Normal Alignment:  Normal Vertebrae: Diffusely abnormal bone marrow which is low signal on T1 and T2 compatible with chronic anemia.  Recent IV iron infusion therapy. Conus medullaris: Extends to the T12-L1 level. Moderate spinal stenosis at  T11-12 and T12-L1 due to spondylosis and soft tissue thickening in the epidural space related to disc space infection. No definite signal abnormality in the conus medullaris. Paraspinal and other soft tissues: Paraspinous fluid collections bilaterally at T12-L1 extending into the proximal psoas muscle compatible with abscesses Disc levels: Discitis and osteomyelitis at T11-12, T12-L1, L1-2, L4-5. These levels show disc space edema, enhancement and endplate erosions. Erosion most severe at T12-L1. Circumferential epidural thickening and enhancement at T11 and T12 due to abscess. Multilevel spondylosis throughout the lumbar spine. Spinal and foraminal stenosis is seen throughout the lumbar spine most severe at L4-5 where there is moderate to advanced spinal stenosis. IMPRESSION: 1. Negative for infection in the cervical spine. Extensive multilevel spondylosis without significant spinal stenosis. Marked left foraminal narrowing at C2-3, C3-4, C4-5 due to spurring. 2. 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 3. Paraspinous abscesses bilaterally at T11 and T12 extending into the psoas muscle bilaterally. Loculated left effusion most likely infected. 4. Posterior subdural fluid collection extending from T6 through T10 compatible with abscess. 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 5. Moderate spinal stenosis due to degenerative change throughout the lower thoracic and entire lumbar spine. Moderate stenosis of the canal at T11-T12 and T12-L1. Moderate to severe stenosis at L4-5. Electronically Signed   By: Marlan Palau M.D.   On: 07/08/2018 15:04   Mr Lumbar Spine W Wo Contrast  Result Date: 07/08/2018 CLINICAL DATA:  Back pain.  Rule out spinal infection. EXAM: MRI TOTAL SPINE WITHOUT AND WITH CONTRAST TECHNIQUE: Multisequence MR imaging of the spine from the cervical spine to the sacrum was performed prior to and following IV contrast administration for evaluation of spinal  metastatic disease. CONTRAST:  7 mL Gadovist IV COMPARISON:  CT chest 07/06/2018, CT total spine I will 04/2018 FINDINGS: MRI was done under general anesthesia. MRI CERVICAL SPINE FINDINGS Alignment: Normal alignment. Moderate kyphosis of the cervical spine Vertebrae: Negative for fracture or mass. No evidence of cervical discitis. Multilevel degenerative change. ACDF C6-7 with anterior plate Cord: Mild hyperintensity in the cord at C6-7 as noted on the prior study. No cord compression. Posterior Fossa, vertebral arteries, paraspinal tissues: The patient is intubated. Fluid in the pharynx. No abscess or mass in the neck. Disc levels: Advanced multilevel disc and facet degeneration throughout the cervical spine. No cord compression or significant spinal stenosis. Severe left foraminal narrowing due to spurring at C2-3, C3-4, and C4-5 MRI THORACIC SPINE FINDINGS Alignment:  Normal Vertebrae: Negative for fracture. Hemangioma T8 vertebral body unchanged. Bone marrow diffusely low signal on T1 and T2-T2 related to anemia. Recent IV iron therapy. Cord: Normal spinal cord signal. Posterior subdural or epidural fluid collection posteriorly from approximately T6 through T10. Given the findings of disc space infection in the lower thoracic spine, this is most likely abscess. No significant cord deformity. Paraspinal and other soft tissues: Moderately large loculated left pleural effusion. Small right effusion. Paraspinous fluid collections bilaterally at the T12 level most likely paraspinous abscesses. Disc levels: Disc space narrowing, endplate erosion, and endplate enhancement at T10-11, T11-T12, T12-L1 compatible with discitis and osteomyelitis. Marked degenerative changes of these levels with mild spinal stenosis at T10-11, moderate spinal stenosis at T11-12 and T12-L1. Extensive foraminal encroachment bilaterally T11-12 and T12-L1 due to spurring. There is circumferential epidural thickening and T11 and T12 which is  likely due to epidural abscess. MRI  LUMBAR SPINE FINDINGS Segmentation:  Normal Alignment:  Normal Vertebrae: Diffusely abnormal bone marrow which is low signal on T1 and T2 compatible with chronic anemia. Recent IV iron infusion therapy. Conus medullaris: Extends to the T12-L1 level. Moderate spinal stenosis at T11-12 and T12-L1 due to spondylosis and soft tissue thickening in the epidural space related to disc space infection. No definite signal abnormality in the conus medullaris. Paraspinal and other soft tissues: Paraspinous fluid collections bilaterally at T12-L1 extending into the proximal psoas muscle compatible with abscesses Disc levels: Discitis and osteomyelitis at T11-12, T12-L1, L1-2, L4-5. These levels show disc space edema, enhancement and endplate erosions. Erosion most severe at T12-L1. Circumferential epidural thickening and enhancement at T11 and T12 due to abscess. Multilevel spondylosis throughout the lumbar spine. Spinal and foraminal stenosis is seen throughout the lumbar spine most severe at L4-5 where there is moderate to advanced spinal stenosis. IMPRESSION: 1. Negative for infection in the cervical spine. Extensive multilevel spondylosis without significant spinal stenosis. Marked left foraminal narrowing at C2-3, C3-4, C4-5 due to spurring. 2. 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 3. Paraspinous abscesses bilaterally at T11 and T12 extending into the psoas muscle bilaterally. Loculated left effusion most likely infected. 4. Posterior subdural fluid collection extending from T6 through T10 compatible with abscess. 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 5. Moderate spinal stenosis due to degenerative change throughout the lower thoracic and entire lumbar spine. Moderate stenosis of the canal at T11-T12 and T12-L1. Moderate to severe stenosis at L4-5. Electronically Signed   By: Marlan Palau M.D.   On: 07/08/2018 15:04   Ct Image Guided  Drainage By Percutaneous Catheter  Result Date: 07/08/2018 INDICATION: 68 year old with bacteremia, pneumonia and loculated left pleural effusion. Request for a chest tube placement with image guidance. EXAM: CT-GUIDED PLACEMENT OF LEFT PLEURAL DRAIN MEDICATIONS: No antibiotics given for this procedure. ANESTHESIA/SEDATION: 2.0 mg IV Versed 50 mcg IV Fentanyl Moderate Sedation Time:  24 minutes The patient was continuously monitored during the procedure by the interventional radiology nurse under my direct supervision. COMPLICATIONS: None immediate. TECHNIQUE: Informed written consent was obtained from the patient's wife after a thorough discussion of the procedural risks, benefits and alternatives. All questions were addressed. Maximal Sterile Barrier Technique was utilized including caps, mask, sterile gowns, sterile gloves, sterile drape, hand hygiene and skin antiseptic. A timeout was performed prior to the initiation of the procedure. PROCEDURE: Patient was placed on his right side. CT images through the chest were obtained. The left pleural effusion was also evaluated with ultrasound and one of the larger fluid pockets was targeted. Skin was prepped with chlorhexidine and sterile field was created. 18 gauge trocar needle was directed into the pleural space with CT guidance. Amber fluid was aspirated. Stiff Amplatz wire was advanced into the pleural space. Tract was dilated to accommodate a 14 [place] multipurpose drain. Catheter was sutured to skin and attached to a PleurEvac. Greater than 100 mL of amber colored fluid was removed when the catheter was placed to suction. Dressing was placed over the drain. FINDINGS: Complex left pleural effusion. Fluid is very loculated by ultrasound. Chest tube was placed along the left posterior aspect of the complex effusion. Greater than 100 mL of amber colored fluid was removed. IMPRESSION: Successful placement a left pleural drain with CT guidance. Electronically Signed    By: Richarda Overlie M.D.   On: 07/08/2018 17:52    Review of Systems  Constitutional: Positive for chills and malaise/fatigue.  HENT: Negative.   Eyes: Negative.   Respiratory: Negative.   Cardiovascular: Negative.   Gastrointestinal: Negative.   Genitourinary: Negative.   Musculoskeletal: Positive for back pain.  Skin: Negative.   Neurological: Positive for weakness.  Endo/Heme/Allergies: Negative.   Psychiatric/Behavioral: Positive for depression.   Blood pressure 103/62, pulse 91, temperature 98.4 F (36.9 C), temperature source Oral, resp. rate 20, height 5\' 9"  (1.753 m), weight 79.1 kg, SpO2 90 %. Physical Exam  Constitutional: He appears well-developed and well-nourished.  HENT:  Head: Normocephalic.  Evidence of previous bur hole left frontal region  Eyes: Conjunctivae and EOM are normal.  Neck: Normal range of motion. Neck supple.  Neurological:  Alert oriented but appears mentally slow.  Weakness in the upper extremities and the lower extremities with C7 distribution weakness and intrinsic hand weakness that is evident.  His strength is graded at 4 out of 5 in iliopsoas quadriceps tibialis anterior and gastrocs as is the strength in his finger extensors and intrinsics and grips.  Cranial nerve examination is intact.  Skin: Skin is warm and dry.  Psychiatric: He has a normal mood and affect. His behavior is normal. Thought content normal.    Assessment/Plan: Osteomyelitis and discitis T10-11 T11-12 T12-L1 L1-L2 and L4-L5. Medical treatment is suggested at this time as cultures have been obtained.  He can be followed radiographically and clinically as long as his neurologic status remained stable.  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  He has moderate weakness in his lower extremities.  Will follow along clinically  Stefani Dama 07/08/2018, 6:31 PM

## 2018-07-09 ENCOUNTER — Inpatient Hospital Stay (HOSPITAL_COMMUNITY): Payer: Medicare Other

## 2018-07-09 ENCOUNTER — Encounter (HOSPITAL_COMMUNITY): Payer: Self-pay | Admitting: Radiology

## 2018-07-09 ENCOUNTER — Inpatient Hospital Stay: Payer: Self-pay

## 2018-07-09 DIAGNOSIS — Z87820 Personal history of traumatic brain injury: Secondary | ICD-10-CM

## 2018-07-09 DIAGNOSIS — J869 Pyothorax without fistula: Secondary | ICD-10-CM

## 2018-07-09 DIAGNOSIS — J918 Pleural effusion in other conditions classified elsewhere: Secondary | ICD-10-CM

## 2018-07-09 DIAGNOSIS — R0902 Hypoxemia: Secondary | ICD-10-CM

## 2018-07-09 DIAGNOSIS — J90 Pleural effusion, not elsewhere classified: Secondary | ICD-10-CM

## 2018-07-09 DIAGNOSIS — M4625 Osteomyelitis of vertebra, thoracolumbar region: Secondary | ICD-10-CM

## 2018-07-09 DIAGNOSIS — B9562 Methicillin resistant Staphylococcus aureus infection as the cause of diseases classified elsewhere: Secondary | ICD-10-CM

## 2018-07-09 DIAGNOSIS — M4620 Osteomyelitis of vertebra, site unspecified: Secondary | ICD-10-CM

## 2018-07-09 DIAGNOSIS — M4645 Discitis, unspecified, thoracolumbar region: Secondary | ICD-10-CM

## 2018-07-09 DIAGNOSIS — R7881 Bacteremia: Secondary | ICD-10-CM

## 2018-07-09 DIAGNOSIS — Z978 Presence of other specified devices: Secondary | ICD-10-CM

## 2018-07-09 DIAGNOSIS — Z981 Arthrodesis status: Secondary | ICD-10-CM

## 2018-07-09 DIAGNOSIS — J181 Lobar pneumonia, unspecified organism: Principal | ICD-10-CM

## 2018-07-09 LAB — CBC WITH DIFFERENTIAL/PLATELET
Abs Immature Granulocytes: 0.26 10*3/uL — ABNORMAL HIGH (ref 0.00–0.07)
BASOS ABS: 0 10*3/uL (ref 0.0–0.1)
Basophils Relative: 0 %
Eosinophils Absolute: 0.1 10*3/uL (ref 0.0–0.5)
Eosinophils Relative: 2 %
HCT: 33.4 % — ABNORMAL LOW (ref 39.0–52.0)
Hemoglobin: 9.7 g/dL — ABNORMAL LOW (ref 13.0–17.0)
IMMATURE GRANULOCYTES: 3 %
Lymphocytes Relative: 17 %
Lymphs Abs: 1.4 10*3/uL (ref 0.7–4.0)
MCH: 26 pg (ref 26.0–34.0)
MCHC: 29 g/dL — ABNORMAL LOW (ref 30.0–36.0)
MCV: 89.5 fL (ref 80.0–100.0)
Monocytes Absolute: 0.4 10*3/uL (ref 0.1–1.0)
Monocytes Relative: 4 %
NEUTROS ABS: 5.9 10*3/uL (ref 1.7–7.7)
Neutrophils Relative %: 74 %
Platelets: 202 10*3/uL (ref 150–400)
RBC: 3.73 MIL/uL — ABNORMAL LOW (ref 4.22–5.81)
RDW: 16.5 % — ABNORMAL HIGH (ref 11.5–15.5)
WBC: 8 10*3/uL (ref 4.0–10.5)
nRBC: 0 % (ref 0.0–0.2)

## 2018-07-09 LAB — COMPREHENSIVE METABOLIC PANEL
ALT: 8 U/L (ref 0–44)
AST: 17 U/L (ref 15–41)
Albumin: 1.5 g/dL — ABNORMAL LOW (ref 3.5–5.0)
Alkaline Phosphatase: 96 U/L (ref 38–126)
Anion gap: 6 (ref 5–15)
BUN: 7 mg/dL — ABNORMAL LOW (ref 8–23)
CO2: 23 mmol/L (ref 22–32)
CREATININE: 0.67 mg/dL (ref 0.61–1.24)
Calcium: 8 mg/dL — ABNORMAL LOW (ref 8.9–10.3)
Chloride: 107 mmol/L (ref 98–111)
GFR calc Af Amer: >60 mL/min (ref >60)
GFR calc non Af Amer: >60 mL/min (ref >60)
Glucose, Bld: 94 mg/dL (ref 70–99)
Potassium: 3.9 mmol/L (ref 3.5–5.1)
Sodium: 136 mmol/L (ref 135–145)
Total Bilirubin: 0.5 mg/dL (ref 0.3–1.2)
Total Protein: 5.8 g/dL — ABNORMAL LOW (ref 6.5–8.1)

## 2018-07-09 LAB — CULTURE, BLOOD (ROUTINE X 2): Special Requests: ADEQUATE

## 2018-07-09 MED ORDER — LIDOCAINE-EPINEPHRINE 2 %-1:100000 IJ SOLN
INTRAMUSCULAR | Status: AC
  Filled 2018-07-09: qty 1

## 2018-07-09 NOTE — Care Management Note (Addendum)
Case Management Note  Patient Details  Name: CAYDN CZAPLA MRN: [id number] Date of Birth: 1950/12/04  Subjective/Objective:    From [facility] SNF,  MRSA Bacteremia, Thoracolumbar osteo , empyema, will be on vanc for 10 weeks thru 3/28, conts with chest tube to suction. Plan to return to SNF at dc. CSW aware.                Action/Plan: Plan to return to SNF at dc with iv abx.  Expected Discharge Date:  07/08/18               Expected Discharge Plan:  Skilled Nursing Facility  In-House Referral:  Clinical Social Work  Discharge planning Services  CM Consult  Post Acute Care Choice:    Choice offered to:     DME Arranged:    DME Agency:     HH Arranged:    HH Agency:     Status of Service:  Completed, signed off  If discussed at [employer], dates discussed:    Additional Comments:  Leone Haven, RN 07/09/2018, 3:52 PM

## 2018-07-09 NOTE — Consult Note (Addendum)
NAME:  Wayne Buckley, MRN:  [id number], DOB:  08/27/1950, LOS: 3 ADMISSION DATE:  07/05/2018, CONSULTATION DATE:  1/19 REFERRING MD:  Dr. Lynden OxfordPranav Patel [facility]), CHIEF COMPLAINT:  Pleural Effusion   Brief History   68 yo M recent admission for MRSA bacteremia [facility]hab facility for acute on chronic anemia requiring transfusion. Found to have left sided pneumonia with parapneumonic effusion. Blood cultures on admission grew MRSA bacteremia. Underwent US guided thoracentesis 1/18 that yielded 90 cc of fluid. Unable to aspiration additional fluid due to loculations. CT Chest with large loculated effusion and evidence of T11-T12 and T12-L1 diskitis / osteomyelitis.   History of present illness   Patient is a 68 yo M recently discharged from the hospital on 12/13 after admission for sepsis due to MRSA bacteremia & UTI in the setting of chronic foley catheter (CAUTI). Repeat cultures prior to discharge were negative and echocardiogram showed no evidence of vegetations. Unfortunately TEE was unsuccessful due to ?obstruciton. Initially on vancomycin but change due to daptomycin per ID due to worsening renal dysfunction. [facility] but readmitted 1/18 due to anemia requiring transfusion. He has chronic anemia requiring frequent transfusions. He is on eliquis for hx of DVT but has had no overt bleeding. He was febrile on arrival to 101F and hypotensive 95/57. Found to have a left sided PNA and large parapneumonic effusion on CXR. Blood cultures on admission grew MRSA.   Past Medical History  HTN, chronic anemia requiring recurrent transfusion (no clinical bleeding), history of TBI with neurogenic bladder, chronic indwelling foley, and DVT  Significant Hospital Events   1/17 > Re admit from SNF (recent discharge for MRSA bacteremia)   Consults:  PCCM  ID IR  Procedures:  1/18 >>US guided thoracentesis  1/21>> left pigtail chest tube drain placement per IR  Significant  Diagnostic Tests:  1/18 CTA Chest >> negative for PE. 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 Posterior subdural fluid collection extending from T6 through T10 compatible with abscess. 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  Discitis and osteomyelitis at  T10-11, T11-T12, T12-L1, L1-2, L4-5 . Per Neurosurgery, does not need surgery now.  Loculated left effusion most likely infected.  Micro Data:  1/17 Blood cx >> MRSA 1/17 Urine cx >> No growth (final)  1/18 Thoracentesis fluid >> Gram stain no organisms, culture pending  1/18 Blood Culture >>   Antimicrobials:  1/17 Cefepime x 1 1/17 Metronidazole x 1 1/17 Vancomycin >>   Interim history/subjective:  A&Ox3, laying in bed on room air, appears comfortable.  T Max 98.5, leukocytosis ( 14.2 on 1/21) Left pigtail CT to 20 cm suction, no leaks,  with 650 cc of straw colored pleural fluid in the [place].  Objective   Blood pressure 112/71, pulse 98, temperature 98.7 F (37.1 C), resp. rate 12, height 5\' 9"  (1.753 m), weight 79.1 kg, SpO2 96 %.        Intake/Output Summary (Last 24 hours) at 07/09/2018 1034 Last data filed at 07/09/2018 0614 Gross per 24 hour  Intake 341.32 ml  Output 730 ml  Net -388.68 ml   Filed Weights   07/05/18 1900 07/06/18 0139  Weight: 114.8 kg 79.1 kg    Examination: General: Adult male supine  in bed in NAD HENT: PERRL, NCAT, NO JVD Lungs:Bilateral chest excursion,  Diminished bialterally / L>R , faint bibasilar crackles Cardiovascular: S1, S2, RRR, no m/r/g Abdomen: Soft, non tender, non distended , BS + Extremities: Warm, no edema, no obvious deformities Neuro: Moving all extremities, followings commands, some limb ataxia Psych: Flat affect   Resolved  Hospital Problem list     Assessment & Plan:   Left Upper and Lower Lobe PNA with loculated pleural effusion:  US guided thoracentesis yielded 90 cc of fluid unfortunately limited due to loculations.  CT placement 1/21>> 650 cc straw colored pleural fluid noted in [place]>> continuing to drain well -- Trend CXR for resolution of effusion -- Wean oxygen for sats of > 94% -- Continue vancomycin per ID -- Chest tube draining well for now  -- Consider VATS if CT does not resolve effusion -- F/u pleural fluid cultures -- consider sending  Additional pleural fluid  for culture from 1/20 insertion  MRSA Bacteremia T11-T12, T12-L1 Diskitis / osteomyelitis MRI 1/21: Evidence of Paraspinous abscesses bilaterally at T11 and T12 extending into the psoas muscle bilaterally. Posterior subdural fluid collection extending from T6 through T10 compatible with abscess. 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   -- ID following -- Continue vancomycin  -- Trend WBC and fever curve -- Recommend holding iron supplementation in the setting of bacteremia  -- Repeat echo not done as patient refused and was combative, will need to try again r/o endocarditis. Prior TEE unsuccessful due prior cervical fusion    Best practice:  Diet: heart healthy  Pain/Anxiety/Delirium protocol (if indicated): N/A VAP protocol (if indicated): N/A DVT prophylaxis: Eliquis GI prophylaxis: N/A Glucose control: monitor Mobility: as toleraged Code Status: FULL Family Communication: none at bedside Disposition: remain on floor   Labs   CBC: Recent Labs  Lab 07/05/18 1918 07/06/18 0218 07/06/18 0737 07/07/18 0835  WBC 18.4* 15.2*  --  14.2*  NEUTROABS 15.8*  --   --  11.5*  HGB 7.2* 7.7* 9.3* 9.3*  HCT 23.3* 24.6* 29.1* 29.9*  MCV 87.6 87.2  --  85.4  PLT 776* 666*  --  641*    Basic Metabolic Panel: Recent Labs  Lab 07/05/18 1918 07/06/18 0218 07/07/18 0835 07/08/18 0301  NA 138 139 137 139  K  3.6 3.0* 3.7 3.8  CL 103 105 106 106  CO2 23 24 22 25   GLUCOSE 111* 99 96 84  BUN 22 18 10  7*  CREATININE 0.90 0.83 0.65 0.70  CALCIUM 9.3 8.6* 8.4* 8.4*  MG  --   --  1.7  --    GFR: Estimated Creatinine Clearance: 88.4 mL/min (by C-G formula based on SCr of 0.7 mg/dL). Recent Labs  Lab 07/05/18 1918 07/05/18 2059 07/06/18 0218 07/07/18 0835  WBC 18.4*  --  15.2* 14.2*  LATICACIDVEN  --  0.77  --   --     Liver Function Tests: Recent Labs  Lab 07/05/18 1918 07/07/18 0835  AST 26 19  ALT 11 9  ALKPHOS 96 97  BILITOT 0.7 0.4  PROT 6.3* 5.5*  ALBUMIN 1.8* 1.4*   No results for input(s): LIPASE, AMYLASE in the last 168 hours. No results for input(s): AMMONIA in the last 168 hours.  ABG    Component Value Date/Time   PHART 7.429 04/29/2012 0453   PCO2ART 33.0 (L) 04/29/2012 0453   PO2ART 138.0 (H) 04/29/2012 0453   HCO3 21.5 04/29/2012 0453   TCO2 22.5 04/29/2012  0453   ACIDBASEDEF 2.2 (H) 04/29/2012 0453   O2SAT 99.3 04/29/2012 0453     Coagulation Profile: Recent Labs  Lab 07/05/18 1918  INR 1.89    Cardiac Enzymes: No results for input(s): CKTOTAL, CKMB, CKMBINDEX, TROPONINI in the last 168 hours.  HbA1C: No results found for: HGBA1C  CBG: No results for input(s): GLUCAP in the last 168 hours.  Review of Systems:   Per H&P  Past Medical History  He,  has a past medical history of BPH (benign prostatic hyperplasia), DVT (deep venous thrombosis) (HCC) (02/2018), H/O alcohol abuse, Hypertension, Neurogenic bowel, Spinal cord injury, cervical region [facility](HCC) (02/2018), and TBI (traumatic brain injury) (HCC) (2013).   Surgical History    Past Surgical History:  Procedure Laterality Date  . ANTERIOR CERVICAL DECOMP/DISCECTOMY FUSION N/A 02/27/2018   Procedure: ANTERIOR CERVICAL DECOMPRESSION/DISCECTOMY FUSION C6-7;  Surgeon: Donalee Citrinram, Gary, MD;  [facility];  Service: Neurosurgery;  Laterality: N/A;  . CRANIOTOMY  04/28/2012   Procedure: CRANIOTOMY  HEMATOMA EVACUATION SUBDURAL;  Surgeon: Reinaldo Meekerandy O Kritzer, MD;  Location: [facility] OR;  Service: Neurosurgery;  Laterality: Left;  Left Craniotomy for Subdural Hematoma  . IR FLUORO GUIDE CV LINE RIGHT  05/14/2018  . IR US GUIDE VASC ACCESS RIGHT  05/14/2018  . RADIOLOGY WITH ANESTHESIA N/A 02/27/2018   Procedure: MRI WITH ANESTHESIA;  Surgeon: Radiologist, Medication, MD;  Location: [facility] OR;  Service: Radiology;  Laterality: N/A;  . VENA CAVA FILTER PLACEMENT Right 02/27/2018   Procedure: INSERTION VENA-CAVA FILTER;  Surgeon: Nada LibmanBrabham, Vance W, MD;  Location: [facility] OR;  Service: Vascular;  Laterality: Right;     Social History   reports that he has never smoked. He has never used smokeless tobacco. He reports current alcohol use.   Family History   His family history includes Heart disease in his father.   Allergies No Known Allergies   Home Medications  Prior to Admission medications   Medication Sig Start Date End Date Taking? Authorizing Provider  acetaminophen (TYLENOL) 325 MG tablet Take 2 tablets (650 mg total) by mouth every 6 (six) hours as needed for mild pain. 04/03/18  Yes Angiulli, Mcarthur Rossettianiel J, PA-C  amLODipine (NORVASC) 5 MG tablet Take 5 mg by mouth daily.   Yes [provider]  apixaban (ELIQUIS) 5 MG TABS tablet Take 1 tablet (5 mg total) by mouth 2 (two) times daily. 04/03/18  Yes Angiulli, Mcarthur Rossettianiel J, PA-C  bisacodyl (DULCOLAX) 10 MG suppository Place 1 suppository (10 mg total) rectally daily at 6 (six) AM. Patient taking differently: Place 10 mg rectally daily as needed for mild constipation.  04/03/18  Yes Angiulli, Mcarthur Rossettianiel J, PA-C  carvedilol (COREG) 12.5 MG tablet Take 1 tablet (12.5 mg total) by mouth 2 (two) times daily with a meal. 04/03/18  Yes Angiulli, Mcarthur Rossettianiel J, PA-C  ferrous sulfate 325 (65 FE) MG tablet Take 1 tablet (325 mg total) by mouth 2 (two) times daily with a meal. 05/31/18  Yes Calvert Cantorizwan, Saima, MD  liver oil-zinc oxide (DESITIN) 40 % ointment Apply topically 4  (four) times daily. Apply to sacrum, coccyx, buttocks. Patient taking differently: Apply 1 application topically 4 (four) times daily. Apply to sacrum, coccyx, buttocks. 05/14/18  Yes Rai, Ripudeep K, MD  polyethylene glycol (MIRALAX / GLYCOLAX) packet Take 17 g by mouth daily as needed for mild constipation or moderate constipation. 05/31/18  Yes Rizwan, Ladell HeadsSaima, MD  senna-docusate (SENOKOT-S) 8.6-50 MG tablet Take 1 tablet by mouth 2 (two) times daily. Patient taking differently: Take  1 tablet by mouth 2 (two) times daily as needed (constipation).  05/14/18  Yes Rai, Ripudeep K, MD  sodium phosphate (FLEET) 7-19 GM/118ML ENEM Place 133 mLs (1 enema total) rectally daily as needed for severe constipation. 05/31/18  Yes Calvert Cantor, MD  tamsulosin (FLOMAX) 0.4 MG CAPS capsule Take 1 capsule (0.4 mg total) by mouth daily. 03/06/18  Yes Leroy Sea, MD  vitamin C (VITAMIN C) 1000 MG tablet Take 1 tablet (1,000 mg total) by mouth 2 (two) times daily. 04/03/18  Yes Angiulli, Mcarthur Rossetti, PA-C  amLODipine (NORVASC) 10 MG tablet Take 1 tablet (10 mg total) by mouth daily. Patient not taking: Reported on 07/05/2018 03/06/18   Leroy Sea, MD  collagenase (SANTYL) ointment Apply topically daily. Cleanse gluteal wounds with NS.  Apply Santyl to open areas.  Cover with NS moist 2x2 and foam dressings.  Peel back foam and change daily. Foam is changed every three days and PRN soilage. Patient not taking: Reported on 07/05/2018 05/15/18   Rai, Delene Ruffini, MD  levofloxacin (LEVAQUIN) 750 MG tablet Take 750 mg by mouth daily. For 7 days. Start date 06/25/2018. End date 07/01/2018    [provider]  polyethylene glycol (MIRALAX / GLYCOLAX) packet Take 17 g by mouth 2 (two) times daily. Patient not taking: Reported on 07/05/2018 05/14/18   Cathren Harsh, MD     Critical care time:     Bevelyn Ngo, AGACNP-BC [facility] Pager: [phone] 07/09/2018, 10:34 AM   Attending  Note:  68 year old male with pleural effusion s/p CT placement.  On exam, lungs are clear.  I reviewed CXR myself, CT noted and pleural effusion.  Chest tube put 650 ml of fluid overnight.  Discussed with PCCM-NP and [facility]-MD.    Pleural effusion:  - Maintain CT to suction  - If output decreases or there are evidence of loculation then will consider tPA/DNAse  - CXR in AM  Pneumonia:  - Abx as ordered  - F/u on cultures  Hypoxemia:  - Titrate O2 for sat of 88-92%  PCCM will continue to follow.  Patient seen and examined, agree with above note.  I dictated the care and orders written for this patient under my direction.  Alyson Reedy, MD [phone]

## 2018-07-09 NOTE — Progress Notes (Signed)
[facility] Progress Note  Patient: Wayne Buckley YQM:[id number]   PCP: Patient, No Pcp Per DOB: 1950/09/06   DOA: 07/05/2018   DOS: 07/09/2018   Date of Service: the patient was seen and examined on 07/09/2018  Brief hospital course: Pt. with PMH of MRSA bacteremia, TBI, neurogenic bladder chronic indwelling Foley catheter, prior DVT, anemia; admitted on 07/05/2018, presented with complaint of fatigue and abnormal lab, was found to have symptomatic anemia as well as MRSA bacteremia. Currently further plan is continue IV antibiotics.  Subjective: Patient denies any acute complaint to me.  No back pain no chest pain.  Breathing okay.  Was agitated with ID and cardiac sonographer.  Telemetry: Sinus tachycardia  Assessment and Plan: Recurrent MRSA bacteremia Left-sided pneumonia with loculated left effusion Osteomyelitis of thoracic vertebra Chronic indwelling Foley catheter Discitis and osteomyelitis of the thoracic and lumbar vertebra. 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 Treated in November, completed treatment in December. Echocardiogram at that time showed no vegetation. Now comes to the hospital with abnormal lab and fever and now found to have positive blood cultures with loculated effusion as well as osteomyelitis of the vertebra. Repeat cultures are ordered. Echocardiogram repeat one ordered as well. Appreciate ID and PCCM consultation. Underwent IR guided thoracentesis but it was not able to remove all the fluid. Due to severe loculation CCM was consulted and patient underwent CT-guided drain placement. Currently in the suction.  Significant output in last 24-hour from the chest tube, appreciate PCCM assistance and will monitor. MRI shows evidence of epidural abscess as well as discitis and osteomyelitis.  Neurosurgery feels that the patient does not need any surgery right now. Echocardiogram ordered although the patient refused, planning again tomorrow in  the presence of wife so that he can comply.  Abdominal distension/ ileus- Ogilvie's ? Continue stool softener and as needed Dulcolax suppository as well as enema.  Hypokalemia - replaced  H/o TBI, cord compression - lives in SNF - chronic foley for neurogenic bladder  Essential hypertension -BP stable, continue amlodipine and Coreg  Iron deficiency anemia and probable anemia of chronic illness - cont Ferrous Sulfate, also given IV iron.  Will require IV iron outpatient as well. - transfused 3 U PRBC, IV iron provided as well.  History of DVT (deep vein thrombosis) - Eliquis  Unstageable pressure injury of bilateral buttocks POA.  Pressure Injury 05/03/18 Unstageable - Full thickness tissue loss in which the base of the ulcer is covered by slough (yellow, tan, gray, green or brown) and/or eschar (tan, brown or black) in the wound bed. (Active)  05/03/18 0200  Location: Buttocks  Location Orientation: Left;Right  Staging: Unstageable - Full thickness tissue loss in which the base of the ulcer is covered by slough (yellow, tan, gray, green or brown) and/or eschar (tan, brown or black) in the wound bed.  Wound Description (Comments):   Present on Admission: Yes   Diet: Cardiac diet DVT Prophylaxis: on therapeutic anticoagulation.  Advance goals of care discussion: Full code  Family Communication: family was present at bedside, at the time of interview. The pt provided permission to discuss medical plan with the family. Opportunity was given to ask question and all questions were answered satisfactorily.   Disposition:  Discharge to SNF.  Consultants: PCCM ID Procedures: Echocardiogram   Scheduled Meds: . Chlorhexidine Gluconate Cloth  6 each Topical Q0600  . ferrous sulfate  325 mg Oral BID WC  . mupirocin ointment  1 application Nasal BID  .  tamsulosin  0.4 mg Oral Daily   Continuous Infusions: . lactated ringers Stopped (07/09/18 16100614)  . vancomycin 750 mg  (07/09/18 1842)   PRN Meds: acetaminophen, fentaNYL (SUBLIMAZE) injection, HYDROcodone-acetaminophen, polyethylene glycol Antibiotics: Anti-infectives (From admission, onward)   Start     Dose/Rate Route Frequency Ordered Stop   07/08/18 1800  vancomycin (VANCOCIN) IVPB 750 mg/150 ml premix     750 mg 150 mL/hr over 60 Minutes Intravenous Every 12 hours 07/07/18 1219     07/06/18 1200  vancomycin (VANCOCIN) IVPB 1000 mg/200 mL premix  Status:  Discontinued     1,000 mg 200 mL/hr over 60 Minutes Intravenous Every 12 hours 07/06/18 1014 07/07/18 1219   07/06/18 1000  vancomycin (VANCOCIN) IVPB 1000 mg/200 mL premix  Status:  Discontinued     1,000 mg 200 mL/hr over 60 Minutes Intravenous Every 12 hours 07/06/18 0122 07/06/18 1006   07/06/18 1000  ceFEPIme (MAXIPIME) 2 g in sodium chloride 0.9 % 100 mL IVPB  Status:  Discontinued     2 g 200 mL/hr over 30 Minutes Intravenous Every 12 hours 07/06/18 0122 07/06/18 1756   07/05/18 2300  ceFEPIme (MAXIPIME) 2 g in sodium chloride 0.9 % 100 mL IVPB     2 g 200 mL/hr over 30 Minutes Intravenous  Once 07/05/18 2245 07/06/18 0014   07/05/18 2300  metroNIDAZOLE (FLAGYL) IVPB 500 mg  Status:  Discontinued     500 mg 100 mL/hr over 60 Minutes Intravenous Every 8 hours 07/05/18 2245 07/06/18 1006   07/05/18 2300  vancomycin (VANCOCIN) IVPB 1000 mg/200 mL premix  Status:  Discontinued     1,000 mg 200 mL/hr over 60 Minutes Intravenous  Once 07/05/18 2245 07/05/18 2249   07/05/18 2300  vancomycin (VANCOCIN) 2,000 mg in sodium chloride 0.9 % 500 mL IVPB     2,000 mg 250 mL/hr over 120 Minutes Intravenous  Once 07/05/18 2249 07/06/18 0259   07/05/18 2030  ceFEPIme (MAXIPIME) 2 g in sodium chloride 0.9 % 100 mL IVPB  Status:  Discontinued     2 g 200 mL/hr over 30 Minutes Intravenous  Once 07/05/18 2019 07/05/18 2022   07/05/18 2030  metroNIDAZOLE (FLAGYL) IVPB 500 mg  Status:  Discontinued     500 mg 100 mL/hr over 60 Minutes Intravenous Every 8  hours 07/05/18 2019 07/05/18 2022   07/05/18 2030  vancomycin (VANCOCIN) IVPB 1000 mg/200 mL premix  Status:  Discontinued     1,000 mg 200 mL/hr over 60 Minutes Intravenous  Once 07/05/18 2019 07/05/18 2022       Objective: Physical Exam: Vitals:   07/08/18 1707 07/08/18 2259 07/09/18 0847 07/09/18 1743  BP: 103/62 (!) 86/57 112/71 98/82  Pulse: 91 88 98 100  Resp: 20 18 12 12   Temp:  98.5 F (36.9 C) 98.7 F (37.1 C) 100.2 F (37.9 C)  TempSrc:  Oral Oral Oral  SpO2: 90% 92% 96% 96%  Weight:      Height:        Intake/Output Summary (Last 24 hours) at 07/09/2018 2023 Last data filed at 07/09/2018 1500 Gross per 24 hour  Intake 567.45 ml  Output 730 ml  Net -162.55 ml   Filed Weights   07/05/18 1900 07/06/18 0139  Weight: 114.8 kg 79.1 kg   General: Alert, Awake and Oriented to Time, Place and Person. Appear in mild distress, affect appropriate Eyes: PERRL, Conjunctiva normal ENT: Oral Mucosa clear moist. Neck: no JVD, no Abnormal Mass Or lumps Cardiovascular:  S1 and S2 Present, no Murmur, Peripheral Pulses Present Respiratory: normal respiratory effort, Bilateral Air entry equal and Decreased, no use of accessory muscle, left Crackles, no wheezes Abdomen: Bowel Sound present, Soft and no tenderness, no hernia Skin: no redness, no Rash, no induration Extremities: no Pedal edema, no calf tenderness Neurologic: Grossly no focal neuro deficit. Bilaterally Equal motor strength  Data Reviewed: CBC: Recent Labs  Lab 07/05/18 1918 07/06/18 0218 07/06/18 0737 07/07/18 0835 07/09/18 1725  WBC 18.4* 15.2*  --  14.2* 8.0  NEUTROABS 15.8*  --   --  11.5* 5.9  HGB 7.2* 7.7* 9.3* 9.3* 9.7*  HCT 23.3* 24.6* 29.1* 29.9* 33.4*  MCV 87.6 87.2  --  85.4 89.5  PLT 776* 666*  --  641* 202   Basic Metabolic Panel: Recent Labs  Lab 07/05/18 1918 07/06/18 0218 07/07/18 0835 07/08/18 0301 07/09/18 1725  NA 138 139 137 139 136  K 3.6 3.0* 3.7 3.8 3.9  CL 103 105 106 106  107  CO2 23 24 22 25 23   GLUCOSE 111* 99 96 84 94  BUN 22 18 10  7* 7*  CREATININE 0.90 0.83 0.65 0.70 0.67  CALCIUM 9.3 8.6* 8.4* 8.4* 8.0*  MG  --   --  1.7  --   --     Liver Function Tests: Recent Labs  Lab 07/05/18 1918 07/07/18 0835 07/09/18 1725  AST 26 19 17   ALT 11 9 8   ALKPHOS 96 97 96  BILITOT 0.7 0.4 0.5  PROT 6.3* 5.5* 5.8*  ALBUMIN 1.8* 1.4* 1.5*   No results for input(s): LIPASE, AMYLASE in the last 168 hours. No results for input(s): AMMONIA in the last 168 hours. Coagulation Profile: Recent Labs  Lab 07/05/18 1918  INR 1.89   Cardiac Enzymes: No results for input(s): CKTOTAL, CKMB, CKMBINDEX, TROPONINI in the last 168 hours. BNP (last 3 results) No results for input(s): PROBNP in the last 8760 hours. CBG: No results for input(s): GLUCAP in the last 168 hours. Studies: [facility]  Result Date: 07/09/2018 If [facility] image not attached, placement could not be confirmed due to current cardiac rhythm.    Time spent: 35 minutes  Author: Lynden OxfordPranav Glenard Keesling, MD [facility] 07/09/2018 8:23 PM  Between 7PM-7AM, please contact night-coverage at [email or web address]

## 2018-07-09 NOTE — Progress Notes (Signed)
PHARMACY CONSULT NOTE FOR:  OUTPATIENT  PARENTERAL ANTIBIOTIC THERAPY (OPAT)  Indication: Thoracolumbar osteomyelitis, Empyema, Bacteremia - MRSA Regimen: Vancomycin IV 750 mg Q 12 hours End date: 09/14/2018  If levels obtained tomorrow, will update OPAT   IV antibiotic discharge orders are pended. To discharging provider:  please sign these orders via discharge navigator,  Select New Orders & click on the button choice - Manage This Unsigned Work.     Thank you for allowing pharmacy to be a part of this patient's care.  Sharin Mons, PharmD, BCPS, BCIDP Infectious Diseases Clinical Pharmacist Phone: [phone] 07/09/2018, 2:53 PM

## 2018-07-09 NOTE — Progress Notes (Signed)
Patient refused echo and was combative when communicating.

## 2018-07-09 NOTE — Progress Notes (Addendum)
CRITICAL VALUE ALERT  Critical Value:  INR 4.73  Date & Time Notied:  07/09/18 @ 0610  Provider Notified: Dr. Kirtland Bouchard Schorr  Orders Received/Actions taken: Awaiting new orders

## 2018-07-09 NOTE — [place] FL2 (Signed)
[place] MEDICAID FL2 LEVEL OF CARE SCREENING TOOL     IDENTIFICATION  Patient Name: Wayne Buckley Birthdate: [birth year] Sex: male Admission Date (Current Location): 07/05/2018  [facility] and [place] Number:  Producer, television/film/video and Address:  [facility] [place]. [facility], [address] [place], [place] [address]      Provider Number: [id number]  Attending Physician Name and Address:  Rolly Salter, MD  Relative Name and Phone Number:  Maxtin, Tomasino, [phone] & Zenith Will, Son, [phone]    Current Level of Care: Hospital Recommended Level of Care: Skilled Nursing Facility Prior Approval Number: [id number]  Date Approved/Denied: 03/04/18 PASRR Number:    Discharge Plan: SNF    Current Diagnoses: Patient Active Problem List   Diagnosis Date Noted  . Vertebral osteomyelitis (HCC) 07/09/2018  . Pleural effusion on left   . Fever 07/06/2018  . Abdominal distension   . Ileus (HCC)   . Bacteremia   . 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 Chronic anemia 05/03/2018  . Hyponatremia 05/03/2018  . Sacral ulcer (HCC) 05/03/2018  . History of DVT (deep vein thrombosis) 05/03/2018  . Traumatic brain injury (HCC) 05/03/2018  . MRSA bacteremia 05/03/2018  . Sepsis (HCC) 05/02/2018  . Acute blood loss anemia   . C5-C7 level spinal cord injury (HCC)   . Central cord syndrome (HCC)   . Neurogenic bladder   . Essential hypertension   . AKI (acute kidney injury) (HCC) 02/26/2018  . Bilateral leg weakness 02/26/2018  . Leg DVT (deep venous thromboembolism), acute, left (HCC) 02/26/2018  . Fall 02/25/2018  . Alcohol abuse 05/04/2012  . Hypokalemia 05/04/2012  . Urinary retention 05/04/2012  . Subdural hematoma, post-traumatic (HCC) 04/28/2012    Orientation RESPIRATION BLADDER Height & Weight     Self, Place  O2(Nasal Cannula 2L) Continent Weight: 174 lb 6.1 oz (79.1 kg) Height:  5\' 9"  (175.3 cm)  BEHAVIORAL SYMPTOMS/MOOD NEUROLOGICAL BOWEL NUTRITION  STATUS      Incontinent Diet(Soft diet, thin liquids)  AMBULATORY STATUS COMMUNICATION OF NEEDS Skin   Extensive Assist Verbally PU Stage and Appropriate Care(PU unstageable, buttocks, foam dressing, PRN)                       Personal Care Assistance Level of Assistance  Dressing, Feeding, Bathing Bathing Assistance: Maximum assistance Feeding assistance: Limited assistance Dressing Assistance: Maximum assistance     Functional Limitations Info  Sight, Hearing, Speech Sight Info: Adequate Hearing Info: Adequate Speech Info: Adequate    SPECIAL CARE FACTORS FREQUENCY  PT (By licensed PT), OT (By licensed OT)                    Contractures Contractures Info: Not present    Additional Factors Info  Code Status, Allergies Code Status Info: Full Code Allergies Info: No known allergies           Current Medications (07/09/2018):  This is the current hospital active medication list Current Facility-Administered Medications  Medication Dose Route Frequency Provider Last Rate Last Dose  . acetaminophen (TYLENOL) tablet 650 mg  650 mg Oral Q6H PRN Haydee Monica, MD      . Chlorhexidine Gluconate Cloth 2 % PADS 6 each  6 each Topical Q0600 Rolly Salter, MD   6 each at 07/09/18 [phone]  . fentaNYL (SUBLIMAZE) injection 12.5-25 mcg  12.5-25 mcg Intravenous Q3H PRN Rolly Salter, MD      . ferrous sulfate tablet  325 mg  325 mg Oral BID WC Haydee Monica, MD   325 mg at 07/09/18 [phone]  . HYDROcodone-acetaminophen (NORCO/VICODIN) 5-325 MG per tablet 1 tablet  1 tablet Oral Q6H PRN Rolly Salter, MD      . lactated ringers infusion   Intravenous Continuous Phillips Grout, MD   Stopped at 07/09/18 [phone]  . mupirocin ointment (BACTROBAN) 2 % 1 application  1 application Nasal BID Rolly Salter, MD   1 application at 07/09/18 [phone]  . polyethylene glycol (MIRALAX / GLYCOLAX) packet 17 g  17 g Oral Daily PRN Tarry Kos A, MD      . tamsulosin (FLOMAX) capsule 0.4 mg   0.4 mg Oral Daily Tarry Kos A, MD   0.4 mg at 07/09/18 0807  . vancomycin (VANCOCIN) IVPB 750 mg/150 ml premix  750 mg Intravenous Q12H Rolly Salter, MD   Stopped at 07/09/18 0800     Discharge Medications: Please see discharge summary for a list of discharge medications.  Relevant Imaging Results:  Relevant Lab Results:   Additional Information SSN: [id number]  Maree Krabbe, LCSW

## 2018-07-09 NOTE — Progress Notes (Signed)
Patient ID: Wayne Buckley, male   DOB: [birth year], 68 y.o.   MRN: [id number] Patient is arousable but somewhat contrary today.  He would not allow his back to be examined.  His motor exam appears stable.  We will try again tomorrow.

## 2018-07-09 NOTE — Progress Notes (Signed)
Referring Physician(s): Max Fickle  Supervising Physician: Oley Balm  Patient Status:  [facility] - In-pt  Chief Complaint:  68 year old with bacteremia, pneumonia and loculated left pleural effusion.  S/P chest tube placement with image guidance by Dr. Lowella Dandy on 07/08/2018  Subjective:  Patient doing ok. No complaints. Resting in bed.  Allergies: Patient has no known allergies.  Medications: Prior to Admission medications   Medication Sig Start Date End Date Taking? Authorizing Provider  acetaminophen (TYLENOL) 325 MG tablet Take 2 tablets (650 mg total) by mouth every 6 (six) hours as needed for mild pain. 04/03/18  Yes Angiulli, Mcarthur Rossetti, PA-C  amLODipine (NORVASC) 5 MG tablet Take 5 mg by mouth daily.   Yes [provider]  apixaban (ELIQUIS) 5 MG TABS tablet Take 1 tablet (5 mg total) by mouth 2 (two) times daily. 04/03/18  Yes Angiulli, Mcarthur Rossetti, PA-C  bisacodyl (DULCOLAX) 10 MG suppository Place 1 suppository (10 mg total) rectally daily at 6 (six) AM. Patient taking differently: Place 10 mg rectally daily as needed for mild constipation.  04/03/18  Yes Angiulli, Mcarthur Rossetti, PA-C  carvedilol (COREG) 12.5 MG tablet Take 1 tablet (12.5 mg total) by mouth 2 (two) times daily with a meal. 04/03/18  Yes Angiulli, Mcarthur Rossetti, PA-C  ferrous sulfate 325 (65 FE) MG tablet Take 1 tablet (325 mg total) by mouth 2 (two) times daily with a meal. 05/31/18  Yes Calvert Cantor, MD  liver oil-zinc oxide (DESITIN) 40 % ointment Apply topically 4 (four) times daily. Apply to sacrum, coccyx, buttocks. Patient taking differently: Apply 1 application topically 4 (four) times daily. Apply to sacrum, coccyx, buttocks. 05/14/18  Yes Rai, Ripudeep K, MD  polyethylene glycol (MIRALAX / GLYCOLAX) packet Take 17 g by mouth daily as needed for mild constipation or moderate constipation. 05/31/18  Yes Rizwan, Ladell Heads, MD  senna-docusate (SENOKOT-S) 8.6-50 MG tablet Take 1 tablet by mouth 2 (two)  times daily. Patient taking differently: Take 1 tablet by mouth 2 (two) times daily as needed (constipation).  05/14/18  Yes Rai, Ripudeep K, MD  sodium phosphate (FLEET) 7-19 GM/118ML ENEM Place 133 mLs (1 enema total) rectally daily as needed for severe constipation. 05/31/18  Yes Calvert Cantor, MD  tamsulosin (FLOMAX) 0.4 MG CAPS capsule Take 1 capsule (0.4 mg total) by mouth daily. 03/06/18  Yes Leroy Sea, MD  vitamin C (VITAMIN C) 1000 MG tablet Take 1 tablet (1,000 mg total) by mouth 2 (two) times daily. 04/03/18  Yes Angiulli, Mcarthur Rossetti, PA-C  amLODipine (NORVASC) 10 MG tablet Take 1 tablet (10 mg total) by mouth daily. Patient not taking: Reported on 07/05/2018 03/06/18   Leroy Sea, MD  collagenase (SANTYL) ointment Apply topically daily. Cleanse gluteal wounds with NS.  Apply Santyl to open areas.  Cover with NS moist 2x2 and foam dressings.  Peel back foam and change daily. Foam is changed every three days and PRN soilage. Patient not taking: Reported on 07/05/2018 05/15/18   Rai, Delene Ruffini, MD  levofloxacin (LEVAQUIN) 750 MG tablet Take 750 mg by mouth daily. For 7 days. Start date 06/25/2018. End date 07/01/2018    [provider]  polyethylene glycol (MIRALAX / GLYCOLAX) packet Take 17 g by mouth 2 (two) times daily. Patient not taking: Reported on 07/05/2018 05/14/18   Cathren Harsh, MD     Vital Signs: BP 112/71 (BP Location: Right Arm)   Pulse 98   Temp 98.7 F (37.1 C)   Resp 12  Ht 5\' 9"  (1.753 m)   Wt 79.1 kg   SpO2 96%   BMI 25.75 kg/m   Physical Exam Awake, NAD Chest tube in place on left ~750 mL clear red fluid in the pleurevac No air leak  Imaging: Dg Chest 1 View  Result Date: 07/06/2018 CLINICAL DATA:  Status post left thoracentesis. EXAM: CHEST  1 VIEW COMPARISON:  07/05/2018 FINDINGS: There is persistent left mid to lower lung zone opacity consistent with combination of pleural fluid and atelectasis or pneumonia. No convincing  pneumothorax. Right lung is clear. IMPRESSION: 1. By report, only 90 mL of fluid recovered at thoracentesis. There is no radiographic change in the appearance of the left lung. 2. No convincing pneumothorax. Electronically Signed   By: Amie Portlandavid  Ormond M.D.   On: 07/06/2018 12:37   Dg Chest 2 View  Result Date: 07/05/2018 CLINICAL DATA:  68 y/o  M; anemia. Fever. EXAM: CHEST - 2 VIEW COMPARISON:  05/02/2018 chest radiograph. FINDINGS: Large left pleural effusion. Left mid and lower lung zone opacities. Cardiac silhouette partially obscured by the effusion. Aortic calcific atherosclerosis. ACDF hardware noted. Clear right lung. No pneumothorax. Bones are unremarkable. IVC filter in situ. IMPRESSION: Large left pleural effusion. Left mid and lower lung zone opacities may represent associated atelectasis or pneumonia. Electronically Signed   By: Mitzi HansenLance  Furusawa-Stratton M.D.   On: 07/05/2018 22:26   Ct Angio Chest Pe W Or Wo Contrast  Result Date: 07/06/2018 CLINICAL DATA:  Difficulty breathing, positive D-dimer EXAM: CT ANGIOGRAPHY CHEST WITH CONTRAST TECHNIQUE: Multidetector CT imaging of the chest was performed using the standard protocol during bolus administration of intravenous contrast. Multiplanar CT image reconstructions and MIPs were obtained to evaluate the vascular anatomy. CONTRAST:  100mL ISOVUE-370 IOPAMIDOL (ISOVUE-370) INJECTION 76% COMPARISON:  07/06/2018, CT of the thoracic spine from 02/25/2018, CT of the abdomen dated 05/02/2018 FINDINGS: Cardiovascular: Thoracic aorta is well visualize with a normal branching pattern. No aneurysmal dilatation or dissection is seen. Coronary calcifications as well as aortic atherosclerotic calcifications are noted. The pulmonary artery shows a normal branching pattern. No definitive pulmonary emboli are noted. Mediastinum/Nodes: Thoracic inlet is within normal limits. Multiple calcified hilar and mediastinal lymph nodes are noted consistent with prior  granulomatous disease. The esophagus is well visualized and within normal limits. Lungs/Pleura: On the left there is considerable upper lobe and lower lobe consolidation with associated underlying effusion. This is loculated in nature based on recent ultrasound examination from earlier in the same day. No post thoracentesis pneumothorax is seen. The right lung is well expanded with minimal lower lobe atelectatic changes as well as small effusion. A 3 mm nodule is noted in the upper lobe best seen on image number 29 of series 6. This is stable from the recent CT of the thoracic spine. No other definitive nodules are seen. Upper Abdomen: Visualized upper abdomen shows no acute abnormality. Musculoskeletal: Postoperative changes in the cervical spine are noted. Multilevel degenerative changes in the thoracic spine are seen. There are progressive significant erosive changes identified at T11-T12 and T12-L1. These changes are new from the recent CT from September of 2019 and are highly suspicious for diskitis and underlying osteomyelitis. Considerable surrounding soft tissue prominence is noted related to the underlying inflammatory change. There is some suggestion of encroachment in the spinal canal related to the changes at T11-T12. Review of the MIP images confirms the above findings. IMPRESSION: No evidence of pulmonary emboli. Large loculated left pleural effusion with associated upper and lower lobe consolidation.  Small right pleural effusion and basilar atelectasis. Considerable erosive changes at T11-T12 as well as T12-L1 with surrounding soft tissue change highly suspicious for diskitis and underlying osteomyelitis. MRI would be helpful for further evaluation. Some suggestion of soft tissue encroachment into the spinal canal is noted at T11-T12. These changes are new from the prior CT examination from 2 months previous. Critical Value/emergent results were called by telephone at the time of interpretation on  07/06/2018 at 4:58 pm to Dr. Lynden Oxford , who verbally acknowledged these results. Aortic Atherosclerosis (ICD10-I70.0). Electronically Signed   By: Alcide Clever M.D.   On: 07/06/2018 17:00   Mr Cervical Spine W Wo Contrast  Result Date: 07/08/2018 CLINICAL DATA:  Back pain.  Rule out spinal infection. EXAM: MRI TOTAL SPINE WITHOUT AND WITH CONTRAST TECHNIQUE: Multisequence MR imaging of the spine from the cervical spine to the sacrum was performed prior to and following IV contrast administration for evaluation of spinal metastatic disease. CONTRAST:  7 mL Gadovist IV COMPARISON:  CT chest 07/06/2018, CT total spine I will 04/2018 FINDINGS: MRI was done under general anesthesia. MRI CERVICAL SPINE FINDINGS Alignment: Normal alignment. Moderate kyphosis of the cervical spine Vertebrae: Negative for fracture or mass. No evidence of cervical discitis. Multilevel degenerative change. ACDF C6-7 with anterior plate Cord: Mild hyperintensity in the cord at C6-7 as noted on the prior study. No cord compression. Posterior Fossa, vertebral arteries, paraspinal tissues: The patient is intubated. Fluid in the pharynx. No abscess or mass in the neck. Disc levels: Advanced multilevel disc and facet degeneration throughout the cervical spine. No cord compression or significant spinal stenosis. Severe left foraminal narrowing due to spurring at C2-3, C3-4, and C4-5 MRI THORACIC SPINE FINDINGS Alignment:  Normal Vertebrae: Negative for fracture. Hemangioma T8 vertebral body unchanged. Bone marrow diffusely low signal on T1 and T2-T2 related to anemia. Recent IV iron therapy. Cord: Normal spinal cord signal. Posterior subdural or epidural fluid collection posteriorly from approximately T6 through T10. Given the findings of disc space infection in the lower thoracic spine, this is most likely abscess. No significant cord deformity. Paraspinal and other soft tissues: Moderately large loculated left pleural effusion. Small right  effusion. Paraspinous fluid collections bilaterally at the T12 level most likely paraspinous abscesses. Disc levels: Disc space narrowing, endplate erosion, and endplate enhancement at T10-11, T11-T12, T12-L1 compatible with discitis and osteomyelitis. Marked degenerative changes of these levels with mild spinal stenosis at T10-11, moderate spinal stenosis at T11-12 and T12-L1. Extensive foraminal encroachment bilaterally T11-12 and T12-L1 due to spurring. There is circumferential epidural thickening and T11 and T12 which is likely due to epidural abscess. MRI LUMBAR SPINE FINDINGS Segmentation:  Normal Alignment:  Normal Vertebrae: Diffusely abnormal bone marrow which is low signal on T1 and T2 compatible with chronic anemia. Recent IV iron infusion therapy. Conus medullaris: Extends to the T12-L1 level. Moderate spinal stenosis at T11-12 and T12-L1 due to spondylosis and soft tissue thickening in the epidural space related to disc space infection. No definite signal abnormality in the conus medullaris. Paraspinal and other soft tissues: Paraspinous fluid collections bilaterally at T12-L1 extending into the proximal psoas muscle compatible with abscesses Disc levels: Discitis and osteomyelitis at T11-12, T12-L1, L1-2, L4-5. These levels show disc space edema, enhancement and endplate erosions. Erosion most severe at T12-L1. Circumferential epidural thickening and enhancement at T11 and T12 due to abscess. Multilevel spondylosis throughout the lumbar spine. Spinal and foraminal stenosis is seen throughout the lumbar spine most severe at L4-5 where  there is moderate to advanced spinal stenosis. IMPRESSION: 1. Negative for infection in the cervical spine. Extensive multilevel spondylosis without significant spinal stenosis. Marked left foraminal narrowing at C2-3, C3-4, C4-5 due to spurring. 2. 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 3. Paraspinous abscesses bilaterally at T11 and T12  extending into the psoas muscle bilaterally. Loculated left effusion most likely infected. 4. Posterior subdural fluid collection extending from T6 through T10 compatible with abscess. 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 5. Moderate spinal stenosis due to degenerative change throughout the lower thoracic and entire lumbar spine. Moderate stenosis of the canal at T11-T12 and T12-L1. Moderate to severe stenosis at L4-5. Electronically Signed   By: Marlan Palauharles  Clark M.D.   On: 07/08/2018 15:04   Mr Thoracic Spine W Wo Contrast  Result Date: 07/08/2018 CLINICAL DATA:  Back pain.  Rule out spinal infection. EXAM: MRI TOTAL SPINE WITHOUT AND WITH CONTRAST TECHNIQUE: Multisequence MR imaging of the spine from the cervical spine to the sacrum was performed prior to and following IV contrast administration for evaluation of spinal metastatic disease. CONTRAST:  7 mL Gadovist IV COMPARISON:  CT chest 07/06/2018, CT total spine I will 04/2018 FINDINGS: MRI was done under general anesthesia. MRI CERVICAL SPINE FINDINGS Alignment: Normal alignment. Moderate kyphosis of the cervical spine Vertebrae: Negative for fracture or mass. No evidence of cervical discitis. Multilevel degenerative change. ACDF C6-7 with anterior plate Cord: Mild hyperintensity in the cord at C6-7 as noted on the prior study. No cord compression. Posterior Fossa, vertebral arteries, paraspinal tissues: The patient is intubated. Fluid in the pharynx. No abscess or mass in the neck. Disc levels: Advanced multilevel disc and facet degeneration throughout the cervical spine. No cord compression or significant spinal stenosis. Severe left foraminal narrowing due to spurring at C2-3, C3-4, and C4-5 MRI THORACIC SPINE FINDINGS Alignment:  Normal Vertebrae: Negative for fracture. Hemangioma T8 vertebral body unchanged. Bone marrow diffusely low signal on T1 and T2-T2 related to anemia. Recent IV iron therapy. Cord: Normal spinal cord signal.  Posterior subdural or epidural fluid collection posteriorly from approximately T6 through T10. Given the findings of disc space infection in the lower thoracic spine, this is most likely abscess. No significant cord deformity. Paraspinal and other soft tissues: Moderately large loculated left pleural effusion. Small right effusion. Paraspinous fluid collections bilaterally at the T12 level most likely paraspinous abscesses. Disc levels: Disc space narrowing, endplate erosion, and endplate enhancement at T10-11, T11-T12, T12-L1 compatible with discitis and osteomyelitis. Marked degenerative changes of these levels with mild spinal stenosis at T10-11, moderate spinal stenosis at T11-12 and T12-L1. Extensive foraminal encroachment bilaterally T11-12 and T12-L1 due to spurring. There is circumferential epidural thickening and T11 and T12 which is likely due to epidural abscess. MRI LUMBAR SPINE FINDINGS Segmentation:  Normal Alignment:  Normal Vertebrae: Diffusely abnormal bone marrow which is low signal on T1 and T2 compatible with chronic anemia. Recent IV iron infusion therapy. Conus medullaris: Extends to the T12-L1 level. Moderate spinal stenosis at T11-12 and T12-L1 due to spondylosis and soft tissue thickening in the epidural space related to disc space infection. No definite signal abnormality in the conus medullaris. Paraspinal and other soft tissues: Paraspinous fluid collections bilaterally at T12-L1 extending into the proximal psoas muscle compatible with abscesses Disc levels: Discitis and osteomyelitis at T11-12, T12-L1, L1-2, L4-5. These levels show disc space edema, enhancement and endplate erosions. Erosion most severe at T12-L1. Circumferential epidural thickening and enhancement at T11 and  T12 due to abscess. Multilevel spondylosis throughout the lumbar spine. Spinal and foraminal stenosis is seen throughout the lumbar spine most severe at L4-5 where there is moderate to advanced spinal stenosis.  IMPRESSION: 1. Negative for infection in the cervical spine. Extensive multilevel spondylosis without significant spinal stenosis. Marked left foraminal narrowing at C2-3, C3-4, C4-5 due to spurring. 2. 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 3. Paraspinous abscesses bilaterally at T11 and T12 extending into the psoas muscle bilaterally. Loculated left effusion most likely infected. 4. Posterior subdural fluid collection extending from T6 through T10 compatible with abscess. 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 5. Moderate spinal stenosis due to degenerative change throughout the lower thoracic and entire lumbar spine. Moderate stenosis of the canal at T11-T12 and T12-L1. Moderate to severe stenosis at L4-5. Electronically Signed   By: Marlan Palau M.D.   On: 07/08/2018 15:04   Mr Lumbar Spine W Wo Contrast  Result Date: 07/08/2018 CLINICAL DATA:  Back pain.  Rule out spinal infection. EXAM: MRI TOTAL SPINE WITHOUT AND WITH CONTRAST TECHNIQUE: Multisequence MR imaging of the spine from the cervical spine to the sacrum was performed prior to and following IV contrast administration for evaluation of spinal metastatic disease. CONTRAST:  7 mL Gadovist IV COMPARISON:  CT chest 07/06/2018, CT total spine I will 04/2018 FINDINGS: MRI was done under general anesthesia. MRI CERVICAL SPINE FINDINGS Alignment: Normal alignment. Moderate kyphosis of the cervical spine Vertebrae: Negative for fracture or mass. No evidence of cervical discitis. Multilevel degenerative change. ACDF C6-7 with anterior plate Cord: Mild hyperintensity in the cord at C6-7 as noted on the prior study. No cord compression. Posterior Fossa, vertebral arteries, paraspinal tissues: The patient is intubated. Fluid in the pharynx. No abscess or mass in the neck. Disc levels: Advanced multilevel disc and facet degeneration throughout the cervical spine. No cord compression or significant spinal stenosis.  Severe left foraminal narrowing due to spurring at C2-3, C3-4, and C4-5 MRI THORACIC SPINE FINDINGS Alignment:  Normal Vertebrae: Negative for fracture. Hemangioma T8 vertebral body unchanged. Bone marrow diffusely low signal on T1 and T2-T2 related to anemia. Recent IV iron therapy. Cord: Normal spinal cord signal. Posterior subdural or epidural fluid collection posteriorly from approximately T6 through T10. Given the findings of disc space infection in the lower thoracic spine, this is most likely abscess. No significant cord deformity. Paraspinal and other soft tissues: Moderately large loculated left pleural effusion. Small right effusion. Paraspinous fluid collections bilaterally at the T12 level most likely paraspinous abscesses. Disc levels: Disc space narrowing, endplate erosion, and endplate enhancement at T10-11, T11-T12, T12-L1 compatible with discitis and osteomyelitis. Marked degenerative changes of these levels with mild spinal stenosis at T10-11, moderate spinal stenosis at T11-12 and T12-L1. Extensive foraminal encroachment bilaterally T11-12 and T12-L1 due to spurring. There is circumferential epidural thickening and T11 and T12 which is likely due to epidural abscess. MRI LUMBAR SPINE FINDINGS Segmentation:  Normal Alignment:  Normal Vertebrae: Diffusely abnormal bone marrow which is low signal on T1 and T2 compatible with chronic anemia. Recent IV iron infusion therapy. Conus medullaris: Extends to the T12-L1 level. Moderate spinal stenosis at T11-12 and T12-L1 due to spondylosis and soft tissue thickening in the epidural space related to disc space infection. No definite signal abnormality in the conus medullaris. Paraspinal and other soft tissues: Paraspinous fluid collections bilaterally at T12-L1 extending into the proximal psoas muscle compatible with abscesses Disc levels: Discitis and osteomyelitis at T11-12, T12-L1,  L1-2, L4-5. These levels show disc space edema, enhancement and endplate  erosions. Erosion most severe at T12-L1. Circumferential epidural thickening and enhancement at T11 and T12 due to abscess. Multilevel spondylosis throughout the lumbar spine. Spinal and foraminal stenosis is seen throughout the lumbar spine most severe at L4-5 where there is moderate to advanced spinal stenosis. IMPRESSION: 1. Negative for infection in the cervical spine. Extensive multilevel spondylosis without significant spinal stenosis. Marked left foraminal narrowing at C2-3, C3-4, C4-5 due to spurring. 2. 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 3. Paraspinous abscesses bilaterally at T11 and T12 extending into the psoas muscle bilaterally. Loculated left effusion most likely infected. 4. Posterior subdural fluid collection extending from T6 through T10 compatible with abscess. 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 5. Moderate spinal stenosis due to degenerative change throughout the lower thoracic and entire lumbar spine. Moderate stenosis of the canal at T11-T12 and T12-L1. Moderate to severe stenosis at L4-5. Electronically Signed   By: Marlan Palau M.D.   On: 07/08/2018 15:04   Ct Image Guided Drainage By Percutaneous Catheter  Result Date: 07/08/2018 INDICATION: 68 year old with bacteremia, pneumonia and loculated left pleural effusion. Request for a chest tube placement with image guidance. EXAM: CT-GUIDED PLACEMENT OF LEFT PLEURAL DRAIN MEDICATIONS: No antibiotics given for this procedure. ANESTHESIA/SEDATION: 2.0 mg IV Versed 50 mcg IV Fentanyl Moderate Sedation Time:  24 minutes The patient was continuously monitored during the procedure by the interventional radiology nurse under my direct supervision. COMPLICATIONS: None immediate. TECHNIQUE: Informed written consent was obtained from the patient's wife after a thorough discussion of the procedural risks, benefits and alternatives. All questions were addressed. Maximal Sterile Barrier Technique was  utilized including caps, mask, sterile gowns, sterile gloves, sterile drape, hand hygiene and skin antiseptic. A timeout was performed prior to the initiation of the procedure. PROCEDURE: Patient was placed on his right side. CT images through the chest were obtained. The left pleural effusion was also evaluated with ultrasound and one of the larger fluid pockets was targeted. Skin was prepped with chlorhexidine and sterile field was created. 18 gauge trocar needle was directed into the pleural space with CT guidance. Amber fluid was aspirated. Stiff Amplatz wire was advanced into the pleural space. Tract was dilated to accommodate a 14 [place] multipurpose drain. Catheter was sutured to skin and attached to a PleurEvac. Greater than 100 mL of amber colored fluid was removed when the catheter was placed to suction. Dressing was placed over the drain. FINDINGS: Complex left pleural effusion. Fluid is very loculated by ultrasound. Chest tube was placed along the left posterior aspect of the complex effusion. Greater than 100 mL of amber colored fluid was removed. IMPRESSION: Successful placement a left pleural drain with CT guidance. Electronically Signed   By: Richarda Overlie M.D.   On: 07/08/2018 17:52   US Thoracentesis Asp Pleural Space W/img Guide  Result Date: 07/06/2018 INDICATION: Patient with history of chronic anemia, TBI, DVT now admitted for pneumonia - found to have pleural effusion. Request for diagnostic and therapeutic thoracentesis today/ EXAM: ULTRASOUND GUIDED LEFT THORACENTESIS MEDICATIONS: 8 mL 1% lidocaine. COMPLICATIONS: None immediate. PROCEDURE: An ultrasound guided thoracentesis was thoroughly discussed with the patient's wife and questions answered. The benefits, risks, alternatives and complications were also discussed. The patient's wife understands and wishes to proceed with the procedure. Written consent was obtained. Ultrasound was performed to localize and mark an adequate pocket of  fluid in the left chest. The area  was then prepped and draped in the normal sterile fashion. 1% Lidocaine was used for local anesthesia. Under ultrasound guidance a 6 Fr Safe-T-Centesis catheter was introduced. Thoracentesis was performed. The catheter was removed and a dressing applied. FINDINGS: A total of approximately 90 mL of serosanguineous fluid was removed. Pleural fluid is very loculated on ultrasound, unable to aspirate additional fluid after several attempts at catheter repositioning. Residual fluid remains on post procedure ultrasound examination. Samples were sent to the laboratory as requested by the clinical team. IMPRESSION: Successful ultrasound guided left thoracentesis yielding 90 mL of pleural fluid. Loculated left pleural effusion. Read by Lynnette Caffey, PA-C Electronically Signed   By: Richarda Overlie M.D.   On: 07/06/2018 12:26    Labs:  CBC: Recent Labs    05/29/18 0448 07/05/18 1918 07/06/18 0218 07/06/18 0737 07/07/18 0835  WBC 8.3 18.4* 15.2*  --  14.2*  HGB 8.8* 7.2* 7.7* 9.3* 9.3*  HCT 28.3* 23.3* 24.6* 29.1* 29.9*  PLT 590* 776* 666*  --  641*    COAGS: Recent Labs    02/25/18 1413  05/10/18 0642 05/11/18 0615 05/12/18 0628 05/13/18 0503 07/05/18 1918  INR 1.16  --   --   --   --   --  1.89  APTT  --    < > 54* 53* 51* 54*  --    < > = values in this interval not displayed.    BMP: Recent Labs    07/05/18 1918 07/06/18 0218 07/07/18 0835 07/08/18 0301  NA 138 139 137 139  K 3.6 3.0* 3.7 3.8  CL 103 105 106 106  CO2 23 24 22 25   GLUCOSE 111* 99 96 84  BUN 22 18 10  7*  CALCIUM 9.3 8.6* 8.4* 8.4*  CREATININE 0.90 0.83 0.65 0.70  GFRNONAA >60 >60 >60 >60  GFRAA >60 >60 >60 >60    LIVER FUNCTION TESTS: Recent Labs    05/02/18 1402 05/17/18 0659 07/05/18 1918 07/07/18 0835  BILITOT 0.5 0.5 0.7 0.4  AST 83* 20 26 19   ALT 36 16 11 9   ALKPHOS 67 78 96 97  PROT 6.1* 6.2* 6.3* 5.5*  ALBUMIN 2.3* 2.0* 1.8* 1.4*    Assessment and  Plan:  68 year old with bacteremia, pneumonia and loculated left pleural effusion. R  S/P chest tube placement with image guidance on 1/20 by Dr. Lowella Dandy.  Chest X-ray ordered.  Care per CT surgery.  Electronically Signed: Gwynneth Macleod, PA-C 07/09/2018, 12:57 PM    I spent a total of 15 Minutes at the the patient's bedside AND on the patient's hospital floor or unit, greater than 50% of which was counseling/coordinating care for f/u chest tube placement.

## 2018-07-09 NOTE — Evaluation (Signed)
Clinical/Bedside Swallow Evaluation Patient Details  Name: ROLAND LEMMEN MRN: [id number] Date of Birth: 03/22/1951  Today's Date: 07/09/2018 Time: SLP Start Time (ACUTE ONLY): 0857 SLP Stop Time (ACUTE ONLY): 0908 SLP Time Calculation (min) (ACUTE ONLY): 11 min  Past Medical History:  Past Medical History:  Diagnosis Date  . BPH (benign prostatic hyperplasia)   . DVT (deep venous thrombosis) (HCC) 02/2018   extensive LLE DVT  . H/O alcohol abuse   . Hypertension   . Neurogenic bowel   . Spinal cord injury, cervical region (HCC) 02/2018  . TBI (traumatic brain injury) (HCC) 2013   SDH with left partial frontal lobectomy   Past Surgical History:  Past Surgical History:  Procedure Laterality Date  . ANTERIOR CERVICAL DECOMP/DISCECTOMY FUSION N/A 02/27/2018   Procedure: ANTERIOR CERVICAL DECOMPRESSION/DISCECTOMY FUSION C6-7;  Surgeon: Donalee Citrin, MD;  Location: [facility] OR;  Service: Neurosurgery;  Laterality: N/A;  . CRANIOTOMY  04/28/2012   Procedure: CRANIOTOMY HEMATOMA EVACUATION SUBDURAL;  Surgeon: Reinaldo Meeker, MD;  Location: [facility] OR;  Service: Neurosurgery;  Laterality: Left;  Left Craniotomy for Subdural Hematoma  . IR FLUORO GUIDE CV LINE RIGHT  05/14/2018  . IR US GUIDE VASC ACCESS RIGHT  05/14/2018  . RADIOLOGY WITH ANESTHESIA N/A 02/27/2018   Procedure: MRI WITH ANESTHESIA;  Surgeon: Radiologist, Medication, MD;  Location: [facility] OR;  Service: Radiology;  Laterality: N/A;  . VENA CAVA FILTER PLACEMENT Right 02/27/2018   Procedure: INSERTION VENA-CAVA FILTER;  Surgeon: Nada Libman, MD;  Location: [facility] OR;  Service: Vascular;  Laterality: Right;   HPI:  68 yo M  with a PMH significant for TBI, central cord syndrome, neurogenic bladder with indwelling foley catheter, BLE weakness, prior DVT, HTN, alcohol abuse, who had recent admission for MRSA bacteremia (d/c 07/01/18) presented to [facility] ED from rehab facility for acute on chronic anemia requiring transfusion. Found to have left sided  pneumonia with parapneumonic effusion. Blood cultures on admission grew MRSA bacteremia. Underwent US guided thoracentesis 1/18 that yielded 90 cc of fluid. Pt known to SLP service from prior admissions, most recently seen on 05/07/19 with findings of oropharyngeal swallow function consistent with baseline; regular diet, thin liquids was recommended with no further SLP follow-up.    Assessment / Plan / Recommendation Clinical Impression  Pt consumed self-fed boluses of thin liquids via straw with no overt s/s of aspiration, despite his insistence on drinking partially reclined. He declined repositioning despite education about increased aspiration risk, and he also declined any solid trials, but was agreeable to try if SLP came in the afternoon.  Would continue current diet for now with additional reinforcement for use of aspiration precautions. SLP will f/u as able for more assessment with solids as well. SLP Visit Diagnosis: Dysphagia, unspecified (R13.10)    Aspiration Risk  Mild aspiration risk    Diet Recommendation Regular;Thin liquid   Liquid Administration via: Straw Medication Administration: Whole meds with liquid Supervision: Staff to assist with self feeding Compensations: Slow rate;Small sips/bites Postural Changes: Seated upright at 90 degrees    Other  Recommendations Oral Care Recommendations: Oral care BID   Follow up Recommendations None      Frequency and Duration min 2x/week  1 week       Prognosis        Swallow Study   General HPI: 68 yo M  with a PMH significant for TBI, central cord syndrome, neurogenic bladder with indwelling foley catheter, BLE weakness, prior DVT, HTN, alcohol abuse, who had  recent admission for MRSA bacteremia (d/c 07/01/18) presented to [facility] ED from rehab facility for acute on chronic anemia requiring transfusion. Found to have left sided pneumonia with parapneumonic effusion. Blood cultures on admission grew MRSA bacteremia. Underwent US  guided thoracentesis 1/18 that yielded 90 cc of fluid. Pt known to SLP service from prior admissions, most recently seen on 05/07/19 with findings of oropharyngeal swallow function consistent with baseline; regular diet, thin liquids was recommended with no further SLP follow-up.  Type of Study: Bedside Swallow Evaluation Previous Swallow Assessment: see HPI Diet Prior to this Study: Dysphagia 3 (soft);Thin liquids Temperature Spikes Noted: No History of Recent Intubation: No Behavior/Cognition: Alert;Cooperative;Requires cueing Oral Care Completed by SLP: No Oral Cavity - Dentition: Adequate natural dentition Vision: Functional for self-feeding Self-Feeding Abilities: Needs assist;Able to feed self Patient Positioning: Partially reclined Baseline Vocal Quality: Normal    Oral/Motor/Sensory Function     Ice Chips Ice chips: Not tested   Thin Liquid Thin Liquid: Within functional limits Presentation: Self Fed;Straw    Nectar Thick Nectar Thick Liquid: Not tested   Honey Thick Honey Thick Liquid: Not tested   Puree Puree: Not tested   Solid     Solid: Not tested      Maxcine Ham 07/09/2018,10:29 AM  Maxcine Ham, M.A. CCC-SLP Acute Herbalist [phone] Office [phone]

## 2018-07-09 NOTE — Progress Notes (Signed)
PT Cancellation Note  Patient Details Name: Wayne Buckley MRN: [id number] DOB: 1950-06-23   Cancelled Treatment:    Reason Eval/Treat Not Completed: Other (comment).  Pt was not willing to attempt PT today so will try again at another time.   Ivar Drape 07/09/2018, 3:15 PM   Samul Dada, PT MS Acute Rehab Dept. Number: [facility] [id number] and [facility] [phone]

## 2018-07-09 NOTE — Progress Notes (Signed)
[facility] for Infectious Disease  Date of Admission:  07/05/2018     Total days of antibiotics 5 Vancomycin           Patient ID: NEWT LEVINGSTON is a 68 y.o. M with  Principal Problem:   Vertebral osteomyelitis ([place]) Active Problems:   MRSA bacteremia   Pleural effusion on left   Urinary retention   C5-C7 level spinal cord injury ([place])   Essential hypertension   Chronic anemia   Traumatic brain injury ([place])   Fever   . Chlorhexidine Gluconate Cloth  6 each Topical Q0600  . ferrous sulfate  325 mg Oral BID WC  . mupirocin ointment  1 application Nasal BID  . tamsulosin  0.4 mg Oral Daily    SUBJECTIVE: Wants to sleep. No complaints from Mr. Kuznia. His wife is at the bedside and has several questions about her husband's care and current infection treatment plan.   No Known Allergies  OBJECTIVE: Vitals:   07/08/18 1700 07/08/18 1707 07/08/18 2259 07/09/18 0847  BP: 106/66 103/62 (!) 86/57 112/71  Pulse: 80 91 88 98  Resp: _0 Temp:   98.5 F (36.9 C) 98.7 F (37.1 C)  TempSrc:   Oral   SpO2: 94% 90% 92% 96%  Weight:      Height:       Body mass index is 25.75 kg/m.  Physical Exam Vitals signs and nursing note reviewed.  Constitutional:      Appearance: He is not ill-appearing or toxic-appearing.     Comments: Resting in bed.   HENT:     Mouth/Throat:     Mouth: Mucous membranes are moist.     Pharynx: No oropharyngeal exudate.  Neck:     Musculoskeletal: Normal range of motion. No muscular tenderness.  Cardiovascular:     Rate and Rhythm: Normal rate and regular rhythm.     Heart sounds: No murmur.  Pulmonary:     Effort: Pulmonary effort is normal. No respiratory distress.     Comments: Nasal cannula.  Diminished breath sounds on left side with CT in place. No air leak.  Abdominal:     General: Bowel sounds are normal.     Palpations: Abdomen is soft.     Tenderness: There is no abdominal tenderness.  Musculoskeletal:  Normal range of motion.  Lymphadenopathy:     Cervical: No cervical adenopathy.  Skin:    General: Skin is warm and dry.  Neurological:     Mental Status: He is alert and oriented to person, place, and time.  Psychiatric:        Mood and Affect: Mood normal.     Lab Results Lab Results  Component Value Date   WBC 14.2 (H) 07/07/2018   HGB 9.3 (L) 07/07/2018   HCT 29.9 (L) 07/07/2018   MCV 85.4 07/07/2018   PLT 641 (H) 07/07/2018    Lab Results  Component Value Date   CREATININE 0.70 07/08/2018   BUN 7 (L) 07/08/2018   NA 139 07/08/2018   K 3.8 07/08/2018   CL 106 07/08/2018   CO2 25 07/08/2018    Lab Results  Component Value Date   ALT 9 07/07/2018   AST 19 07/07/2018   ALKPHOS 97 07/07/2018   BILITOT 0.4 07/07/2018     Microbiology: BCx 1/17 >> MRSA 2/2 BCx 1/18 >> NGTD   ASSESSMENT: FAHD GALEA is a 68 y.o. male with  with traumatic brain injury history here with MRSA bacteremia as well as left sided pneumonia with parapneumonic loculated effusion and extensive thoracolumbar discitis/osteomyelitis @ T10-11, T11-T12, T12-L1, L1-2, L4-5 as demonstrated on MRI 1/20. Fortunately it looks like his c-spine and hardware is not infected. Dr. Saintclair Halsted has seen the patient and read MRI - no surgery is planned currently. Repeat blood cultures cleared in 24 hours and remain without growth - OK to arrange PICC line placement.   CT-guided chest tube placement done yesterday.   He refused his TTE yesterday - no murmur on exam. If he agrees to transthoracic again would still obtain to ensure no valvular dysfunction or suspicion for IE but at this point he will need 8-10 weeks of IV therapy followed by protracted oral therapy for at least 6 up to 12 months. Will need repeat imaging at 6- 8 week mark to reassess considering severity of disease.   Wife very concerned about deconditioning and rebuilding strength and would like consideration for inpatient rehab - I discussed with Dr.  Posey Pronto to see if physical therapy can assess him. If D/C'd back to [facility] vs [facility] would like to see him 1 week after discharge in ID clinic to closely monitor creatinine (history of possible AKI related either to vanco vs obstruction of catheter).    PLAN: 1. Continue Vancomycin IV with AUC target 400-550 2. ESR + CRP at next lab draw  3. OK to place PICC line (ordered)  4. CT drain per IR 5. Consider PT/OT for CIR evaluation    OPAT ORDERS:  Diagnosis: Thoracolumbar osteomyelitis, Empyema, Bacteremia   Culture Result: MRSA   No Known Allergies  Discharge antibiotics: Per pharmacy protocol Vancomycin AUC 400 - 550 or otherwise trough 15-20   Duration: 10 weeks   End Date: March 28th 2020  [place] and Maintenance Per Protocol **Please use Biop[employer] for PICC care __ Please pull PIC at completion of IV antibiotics _x_ Please leave PIC in place until doctor has seen patient or been notified  Labs weekly while on IV antibiotics: _x_ CBC with differential __ BMP _x_ BMP TWICE WEEKLY** __ CMP _x_ CRP _x_ ESR _x_ Vancomycin trough or AUC dosing whichever preferred  Fax weekly labs to [phone]  Clinic Follow Up Appt: 1 week after hospital discharge with Dr. Linus Salmons or Colletta [place], [place], MSN, NP-C [facility] for Infectious [place] Cell: [phone] Pager: [phone]  07/09/2018  1:25 PM

## 2018-07-09 NOTE — Clinical Social Work Note (Signed)
Clinical Social Work Assessment  Patient Details  Name: Wayne Buckley MRN: [id number] Date of Birth: Nov 29, 1950  Date of referral:  07/09/18               Reason for consult:  Facility Placement                Permission sought to share information with:  Family Supports Permission granted to share information::     Name::     Web designer::     Relationship::  Spouse  Contact Information:     Housing/Transportation Living arrangements for the past 2 months:  Skilled Building surveyor of Information:  Spouse Patient Interpreter Needed:  None Criminal Activity/Legal Involvement Pertinent to Current Situation/Hospitalization:  No - Comment as needed Significant Relationships:  Spouse Lives with:  Facility Resident, Spouse Do you feel safe going back to the place where you live?  No Need for family participation in patient care:  No (Coment)  Care giving concerns:  Pt alert and oriented x2. No family present at bedside.   Social Worker assessment / plan:  CSW spoke with pt's spouse via telephone. Pt's spouse states pt is from [place] however she is not sure if he wants to return. Pt's spouse to discuss with pt. Pt's spouse states if not they would be interested in [place]. Pt's spouse wants to visit [facility] tomorrow to be sure its a option. Pt's spouse to follow up with CSW tomorrow. BCBS prior authorization will be needed prior to admit to SNF. Auth can not be started until choice is determined.  Employment status:  Retired Database administrator PT Recommendations:  Not assessed at this time Information / Referral to community resources:  Skilled Nursing Facility  Patient/Family's Response to care:  Pt's spouse verbalized understanding of CSW role and expressed appreciation for support. Pt's spouse denies any concern regarding pt care at this time.   Patient/Family's Understanding of and Emotional Response to Diagnosis, Current Treatment,  and Prognosis:  Pt's spouse understanding and realistic regarding pt's physical limitations. Pt's spouse understands pt needs to go to SNF at d/c. Pt's spouse denies any concern regarding pt's treatment plan at this time. CSW will continue to provide support and facilitate d/c needs.   Emotional Assessment Appearance:  Appears stated age Attitude/Demeanor/Rapport:  Unable to Assess Affect (typically observed):  Unable to Assess Orientation:  Oriented to Self, Oriented to Place Alcohol / Substance use:  Not Applicable Psych involvement (Current and /or in the community):  No (Comment)  Discharge Needs  Concerns to be addressed:  Basic Needs, Care Coordination Readmission within the last 30 days:  No Current discharge risk:  Dependent with Mobility Barriers to Discharge:  Continued Medical Work up, [employer], LCSW 07/09/2018, 4:09 PM

## 2018-07-10 ENCOUNTER — Inpatient Hospital Stay (HOSPITAL_COMMUNITY): Payer: Medicare Other

## 2018-07-10 DIAGNOSIS — R7881 Bacteremia: Secondary | ICD-10-CM

## 2018-07-10 DIAGNOSIS — J189 Pneumonia, unspecified organism: Secondary | ICD-10-CM

## 2018-07-10 LAB — BASIC METABOLIC PANEL
Anion gap: 5 (ref 5–15)
BUN: 6 mg/dL — ABNORMAL LOW (ref 8–23)
CO2: 24 mmol/L (ref 22–32)
Calcium: 8.1 mg/dL — ABNORMAL LOW (ref 8.9–10.3)
Chloride: 106 mmol/L (ref 98–111)
Creatinine, Ser: 0.7 mg/dL (ref 0.61–1.24)
GFR calc non Af Amer: >60 mL/min (ref >60)
Glucose, Bld: 101 mg/dL — ABNORMAL HIGH (ref 70–99)
Potassium: 3.5 mmol/L (ref 3.5–5.1)
SODIUM: 135 mmol/L (ref 135–145)

## 2018-07-10 LAB — CBC
HCT: 30.3 % — ABNORMAL LOW (ref 39.0–52.0)
Hemoglobin: 9.2 g/dL — ABNORMAL LOW (ref 13.0–17.0)
MCH: 26.7 pg (ref 26.0–34.0)
MCHC: 30.4 g/dL (ref 30.0–36.0)
MCV: 87.8 fL (ref 80.0–100.0)
NRBC: 0 % (ref 0.0–0.2)
Platelets: 559 10*3/uL — ABNORMAL HIGH (ref 150–400)
RBC: 3.45 MIL/uL — ABNORMAL LOW (ref 4.22–5.81)
RDW: 16.3 % — ABNORMAL HIGH (ref 11.5–15.5)
WBC: 11.7 10*3/uL — ABNORMAL HIGH (ref 4.0–10.5)

## 2018-07-10 LAB — ECHOCARDIOGRAM COMPLETE
Height: 69 in
Weight: 2790.14 oz

## 2018-07-10 LAB — VANCOMYCIN, PEAK: Vancomycin Pk: 41 ug/mL — ABNORMAL HIGH (ref 30–40)

## 2018-07-10 LAB — SEDIMENTATION RATE: Sed Rate: 78 mm/hr — ABNORMAL HIGH (ref 0–16)

## 2018-07-10 LAB — C-REACTIVE PROTEIN: CRP: 8.3 mg/dL — ABNORMAL HIGH (ref <1.0)

## 2018-07-10 MED ORDER — SODIUM CHLORIDE 0.9 % IV BOLUS
500.0000 mL | Freq: Once | INTRAVENOUS | Status: AC
  Administered 2018-07-10: 500 mL via INTRAVENOUS

## 2018-07-10 MED FILL — Propofol IV Emul 200 MG/20ML (10 MG/ML): INTRAVENOUS | Qty: 20 | Status: AC

## 2018-07-10 MED FILL — Sugammadex Sodium IV 200 MG/2ML (Base Equivalent): INTRAVENOUS | Qty: 2 | Status: AC

## 2018-07-10 MED FILL — Fentanyl Citrate Preservative Free (PF) Inj 100 MCG/2ML: INTRAMUSCULAR | Qty: 2 | Status: AC

## 2018-07-10 MED FILL — Ondansetron HCl Inj 4 MG/2ML (2 MG/ML): INTRAMUSCULAR | Qty: 2 | Status: AC

## 2018-07-10 MED FILL — Lidocaine HCl(Cardiac) IV PF Soln Pref Syr 100 MG/5ML (2%): INTRAVENOUS | Qty: 5 | Status: AC

## 2018-07-10 MED FILL — Rocuronium Bromide IV Soln 50 MG/5ML (10 MG/ML): INTRAVENOUS | Qty: 10 | Status: AC

## 2018-07-10 MED FILL — Phenylephrine-NaCl Pref Syr 0.4 MG/10ML-0.9% (40 MCG/ML): INTRAVENOUS | Qty: 10 | Status: AC

## 2018-07-10 NOTE — Evaluation (Signed)
Physical Therapy Evaluation Patient Details Name: Wayne Buckley MRN: [id number] DOB: 09/29/1950 Today's Date: 07/10/2018   History of Present Illness  Pt admitted 07/05/18 with symptomatic anemia and MRSA bacteremia. 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     Clinical Impression  Pt admitted with above diagnosis. Pt currently with functional limitations due to the deficits listed below (see PT Problem List). PTA, pt undergoing rehab at SNF able to ambulate short distances with +2 assistance. Today, limited by cogntiive deficits and refusal to mobilize citing generalized pain. Wife present and helpful in motivating patient. demonstrated bed level PROM/AAROM therex. Prior to fall in 9/19, patient was independent with all mobility. Pt will benefit from skilled PT to increase their independence and safety with mobility to allow discharge to the venue listed below.       Follow Up Recommendations CIR    Equipment Recommendations  (TBD)    Recommendations for Other Services Rehab consult     Precautions / Restrictions Precautions Precautions: Fall Restrictions Weight Bearing Restrictions: No      Mobility  Bed Mobility               General bed mobility comments: pt refusing mobility citing pain.   Transfers                    Ambulation/Gait                Stairs            Wheelchair Mobility    Modified Rankin (Stroke Patients Only)       Balance                                             Pertinent Vitals/Pain Pain Assessment: Faces Faces Pain Scale: Hurts even more Pain Location: "all over", unable to specify  Pain Descriptors / Indicators: Grimacing;Moaning Pain Intervention(s): Limited activity within patient's tolerance    Home Living Family/patient expects to be discharged to:: Skilled nursing facility                      Prior Function  Level of Independence: Needs assistance   Gait / Transfers Assistance Needed: per wife was ambulating with +2 assistance      Comments: prior to fall in 09/19, patient was ambulating without assistive device living at home independent with all ADLs per wife     Hand Dominance   Dominant Hand: Right    Extremity/Trunk Assessment   Upper Extremity Assessment Upper Extremity Assessment: Difficult to assess due to impaired cognition    Lower Extremity Assessment Lower Extremity Assessment: Difficult to assess due to impaired cognition       Communication   Communication: Expressive difficulties  Cognition Arousal/Alertness: Awake/alert Behavior During Therapy: Restless;Agitated;Flat affect Overall Cognitive Status: History of cognitive impairments - at baseline                                 General Comments: at baseline per wife. patient AO to person place, not situation or time. patient defiant with therapy,      General Comments      Exercises General Exercises - Lower Extremity Ankle Circles/Pumps: 20 reps Quad Sets: 10 reps Heel Slides:  10 reps Hip ABduction/ADduction: 10 reps   Assessment/Plan    PT Assessment Patient needs continued PT services  PT Problem List Decreased range of motion;Decreased mobility;Decreased balance;Decreased cognition;Decreased coordination       PT Treatment Interventions Gait training;Functional mobility training;Therapeutic exercise;Therapeutic activities;Balance training;DME instruction    PT Goals (Current goals can be found in the Care Plan section)  Acute Rehab PT Goals Patient Stated Goal: rehab here  PT Goal Formulation: With patient Time For Goal Achievement: 07/24/18 Potential to Achieve Goals: Fair    Frequency Min 3X/week   Barriers to discharge        Co-evaluation               AM-PAC PT "6 Clicks" Mobility  Outcome Measure Help needed turning from your back to your side while in a  flat bed without using bedrails?: Total Help needed moving from lying on your back to sitting on the side of a flat bed without using bedrails?: Total Help needed moving to and from a bed to a chair (including a wheelchair)?: Total Help needed standing up from a chair using your arms (e.g., wheelchair or bedside chair)?: Total Help needed to walk in hospital room?: Total Help needed climbing 3-5 steps with a railing? : Total 6 Click Score: 6    End of Session   Activity Tolerance: Patient limited by pain(confusion) Patient left: in bed;with call bell/phone within reach;with bed alarm set;with family/visitor present Nurse Communication: Mobility status PT Visit Diagnosis: Muscle weakness (generalized) (M62.81);Unsteadiness on feet (R26.81);Other abnormalities of gait and mobility (R26.89);Other symptoms and signs involving the nervous system (R29.898)    Time: [id number] PT Time Calculation (min) (ACUTE ONLY): 57 min   Charges:   PT Evaluation $PT Eval Moderate Complexity: 1 Mod PT Treatments $Therapeutic Exercise: 8-22 mins       Etta GrandchildSean Shepard Keltz, PT, DPT Acute Rehabilitation Services Pager: [phone] Office: [phone]    Etta GrandchildSean Saoirse Legere 07/10/2018, 1:42 PM

## 2018-07-10 NOTE — Progress Notes (Addendum)
NAME:  Wayne Buckley, MRN:  [id number], DOB:  12/08/1950, LOS: 4 ADMISSION DATE:  07/05/2018, CONSULTATION DATE:  1/19 REFERRING MD:  Dr. Lynden OxfordPranav Patel [facility]), CHIEF COMPLAINT:  Pleural Effusion   Brief History   68 yo M, admitted 1/17, recent admission for MRSA bacteremia & UTI in setting of chronic foley [facility] from rehab facility for acute on chronic anemia requiring transfusion.  ECHO during prior admit showed no evidence of vegetation (unable to perform TEE). He was discharged to SNF on 1/13.    Found to have left sided pneumonia with parapneumonic effusion. Blood cultures on admission grew MRSA bacteremia. Underwent US guided thoracentesis 1/18 that yielded 90 cc of fluid. Unable to aspiration additional fluid due to loculations. CT Chest with large loculated effusion and evidence of T11-T12 and T12-L1 diskitis / osteomyelitis.   Past Medical History  HTN, chronic anemia requiring recurrent transfusion (no clinical bleeding), history of TBI with neurogenic bladder, chronic indwelling foley, and DVT  Significant Hospital Events   1/17 Re admit from SNF (recent discharge for MRSA bacteremia)   Consults:  PCCM  ID IR  Procedures:  1/18 US guided thoracentesis >> LDH 146, Protein 3.9, WBC 1,755 1/21 Left pigtail chest tube drain placement per IR  Significant Diagnostic Tests:  1/18 CTA Chest >> negative for PE. 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 Posterior subdural fluid collection extending from T6 through T10 compatible with abscess. 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  Discitis and osteomyelitis at  T10-11, T11-T12, T12-L1, L1-2,L4-5 . Per Neurosurgery, does not need surgery now.  Loculated left effusion, concern for infection.     Micro Data:  1/17 Blood cx >> MRSA 1/17 Urine cx >> Negative 1/18 Thoracentesis fluid >>  1/18 Blood Culture >>   Antimicrobials:  1/17 Cefepime x 1 1/17 Metronidazole x 1 1/17 Vancomycin >>   Interim history/subjective:  Pt denies complaints.  Chest tube draining yellow cloudy fluid.   Objective   Blood pressure 113/71, pulse 91, temperature 98.6 F (37 C), resp. rate 16, height 5\' 9"  (1.753 m), weight 79.1 kg, SpO2 97 %.        Intake/Output Summary (Last 24 hours) at 07/10/2018 0835 Last data filed at 07/10/2018 0700 Gross per 24 hour  Intake 766.13 ml  Output 1690 ml  Net -923.87 ml   Filed Weights   07/05/18 1900 07/06/18 0139  Weight: 114.8 kg 79.1 kg    Examination: General: elderly male sitting in bed in NAD HEENT: MM pink/moist, fair dentition  Neuro: Awake, alert, answers questions slowly but appropriately  CV: s1s2 rrr, no m/r/g PULM: even/non-labored, clear on right, diminished on left, left chest tube to 20 cm suction WU:JWJXGI:soft, non-tender, bsx4 active  Extremities: warm/dry, trace LE edema  Skin: no rashes or lesions  Resolved Hospital Problem list     Assessment & Plan:   Left Upper and Lower Lobe PNA with loculated pleural effusion -Thora 1/18 with 90 ml fluid / unable to completely drain due to loculation -IR CT placed 1/21 with 650 ml drained P: Follow intermittent CXR  Wean O2 for sats >90% Vancomycin per ID  Follow pleural cultures.  Send repeat 1/22.    MRSA Bacteremia T11-T12, T12-L1 Diskitis / Osteomyelitis P: Per ID / Primary  SVC IV Vancomycin  Follow fever curve / WBC trend  Consider repeat ECHO at some point > was combative on prior attempts.  Unable to due TEE due to cervical fusion    Best practice:  Diet: heart healthy  Pain/Anxiety/Delirium protocol (if indicated): N/A VAP protocol (if indicated): N/A DVT prophylaxis: Eliquis GI prophylaxis: N/A Glucose control: monitor Mobility: as toleraged Code Status:  FULL Family Communication: No family at bedside am 1/22 Disposition: [facility].  PCCM will continue to follow.   Labs   CBC: Recent Labs  Lab 07/05/18 1918 07/06/18 0218 07/06/18 0737 07/07/18 0835 07/09/18 1725 07/10/18 0652  WBC 18.4* 15.2*  --  14.2* 8.0 11.7*  NEUTROABS 15.8*  --   --  11.5* 5.9  --   HGB 7.2* 7.7* 9.3* 9.3* 9.7* 9.2*  HCT 23.3* 24.6* 29.1* 29.9* 33.4* 30.3*  MCV 87.6 87.2  --  85.4 89.5 87.8  PLT 776* 666*  --  641* 202 559*    Basic Metabolic Panel: Recent Labs  Lab 07/06/18 0218 07/07/18 0835 07/08/18 0301 07/09/18 1725 07/10/18 0652  NA 139 137 139 136 135  K 3.0* 3.7 3.8 3.9 3.5  CL 105 106 106 107 106  CO2 24 22 25 23 24   GLUCOSE 99 96 84 94 101*  BUN 18 10 7* 7* 6*  CREATININE 0.83 0.65 0.70 0.67 0.70  CALCIUM 8.6* 8.4* 8.4* 8.0* 8.1*  MG  --  1.7  --   --   --    GFR: Estimated Creatinine Clearance: 88.4 mL/min (by C-G formula based on SCr of 0.7 mg/dL). Recent Labs  Lab 07/05/18 2059 07/06/18 0218 07/07/18 0835 07/09/18 1725 07/10/18 0652  WBC  --  15.2* 14.2* 8.0 11.7*  LATICACIDVEN 0.77  --   --   --   --     Liver Function Tests: Recent Labs  Lab 07/05/18 1918 07/07/18 0835 07/09/18 1725  AST 26 19 17   ALT 11 9 8   ALKPHOS 96 97 96  BILITOT 0.7 0.4 0.5  PROT 6.3* 5.5* 5.8*  ALBUMIN 1.8* 1.4* 1.5*   No results for input(s): LIPASE, AMYLASE in the last 168 hours. No results for input(s): AMMONIA in the last 168 hours.  ABG    Component Value Date/Time   PHART 7.429 04/29/2012 0453   PCO2ART 33.0 (L) 04/29/2012 0453   PO2ART 138.0 (H) 04/29/2012 0453   HCO3 21.5 04/29/2012 0453   TCO2 22.5 04/29/2012 0453   ACIDBASEDEF 2.2 (H) 04/29/2012 0453   O2SAT 99.3 04/29/2012 0453     Coagulation Profile: Recent Labs  Lab 07/05/18 1918  INR 1.89    Cardiac Enzymes: No results for input(s): CKTOTAL, CKMB, CKMBINDEX, TROPONINI in the last 168 hours.  HbA1C: No results found for: HGBA1C  CBG: No results for  input(s): GLUCAP in the last 168 hours.   Canary Brim, NP-C [facility] Pgr: [phone] or if no answer [phone] 07/10/2018, 8:36 AM  Attending Note:  68 year old male with MRSA bacteremia and parapneumonic effusion with a CT in place.  Febrile overnight.  On exam, lungs with decreased BS diffusely.  I reviewed CXR myself, chest tube is kinked but continues to put out fluid.  Discussed with PCCM-NP.  Parapneumonic effusion:  - Pleural fluid removed and sent to cultures  - F/U on cultures  - CT to suction  Pneumonia:  - Abx as ordered  Hypoxemia:  - Titrate O2 for sat of 88-92%  - May need an  ambulatory desat study prior to discharge for home O2  PCCM will continue to follow  Patient seen and examined, agree with above note.  I dictated the care and orders written for this patient under my direction.  Alyson ReedyYacoub, Juanmanuel Marohl G, MD [phone]

## 2018-07-10 NOTE — Progress Notes (Signed)
PROGRESS NOTE  Kirkland HunJames M Snelling ZOX:[id number] DOB: 02/24/1951 DOA: 07/05/2018 PCP: Patient, No Pcp Per  Brief History     A & P  Recurrent MRSA bacteremia with left-sided pneumonia with loculated left effusion, discitis and osteomyelitis of the thoracic vertebra, epidural abscess T6-T10 --Status post CT-guided drain placement, followed by pulmonology and interventional radiology --MRI showed evidence of epidural abscess, followed by neurosurgery with recommendations and treatment as per that service --Prolonged IV antibiotics as recommended by infectious disease  Essential hypertension --Stable.  Stable off amlodipine and carvedilol  Iron deficiency anemia.  Continue iron supplementation.  Status post IV iron.  Status post transfusion 3 units PRBC. --stable  PMH DVT --Continue apixaban  Unstageable pressure injury of the bilateral buttocks present on admission --wound care   PMH TBI, cord compression.  Resident in SNF.  Chronic Foley for neurogenic bladder.  Continue current treatment, further recommendations per IR, pulmonology, neurosurgery.  DVT prophylaxis: SCDs Code Status: Full Family Communication: none Disposition Plan: SNF    Brendia Sacksaniel Mintie Witherington, MD  [facility] Direct contact: see [email or web address]  7PM-7AM contact night coverage as above 07/10/2018, 7:12 PM  LOS: 4 days   Consultants  . ID . Neurosurgery  . IR  Procedures  . Echo Study Conclusions  - Left ventricle: The cavity size was normal. Systolic function was   normal. The estimated ejection fraction was in the range of 60%   to 65%. Wall motion was normal; there were no regional wall   motion abnormalities. Doppler parameters are consistent with   abnormal left ventricular relaxation (grade 1 diastolic   dysfunction). - Ventricular septum: Septal motion showed &quot;bounce&quot;. - Aortic valve: Transvalvular velocity was within the normal range.   There was no stenosis. There was no  regurgitation. - Mitral valve: Transvalvular velocity was within the normal range.   There was no evidence for stenosis. There was no regurgitation. - Right ventricle: The cavity size was normal. Wall thickness was   normal. Systolic function was normal. - Atrial septum: No defect or patent foramen ovale was identified   by color flow Doppler. - Tricuspid valve: There was no regurgitation.  Antibiotics  .   Interval History/Subjective  Feels okay, no pain, no complaints.  Objective   Vitals:  Vitals:   07/10/18 0818 07/10/18 1725  BP: 113/71 125/76  Pulse: 91 (!) 107  Resp:    Temp: 98.6 F (37 C) 99.6 F (37.6 C)  SpO2: 97% 96%    Exam:  Constitutional:  . Appears calm and comfortable Eyes:  . pupils and irises appear normal ENMT:  . grossly normal hearing  Respiratory:  . CTA bilaterally, no w/r/r.  . Respiratory effort normal. Cardiovascular:  . RRR, no m/r/g . No LE extremity edema   Abdomen:  . Soft, nontender, nondistended Psychiatric:  . Mental status . Alert, speech somewhat difficult to understand, answers questions simply and appropriately.   I have personally reviewed the following:   Today's Data  . Basic metabolic panel unremarkable . Hemoglobin stable at 9.2.  WBC 11.7.  Platelets 559.  Lab Data  .   Micro Data  .   Imaging  .   Cardiology Data  .   Other Data  .   Scheduled Meds: . Chlorhexidine Gluconate Cloth  6 each Topical Q0600  . ferrous sulfate  325 mg Oral BID WC  . mupirocin ointment  1 application Nasal BID  . tamsulosin  0.4 mg Oral Daily   Continuous Infusions: . lactated  ringers Stopped (07/09/18 9767)  . vancomycin 750 mg (07/10/18 1719)    Principal Problem:   Vertebral osteomyelitis (HCC) Active Problems:   Urinary retention   C5-C7 level spinal cord injury (HCC)   Essential hypertension   Chronic anemia   Traumatic brain injury (HCC)   MRSA bacteremia   Pleural effusion on left   LOS: 4 days

## 2018-07-10 NOTE — Progress Notes (Signed)
Referring Physician(s): Max Fickle  Supervising Physician: Hoss  Patient Status:  [facility] - In-pt  Chief Complaint:  68 year old with bacteremia, pneumonia and loculated left pleural effusion.  S/P chest tube placement with image guidance by Dr. Lowella Dandy on 07/08/2018  Subjective:  Patient doing ok. No complaints. Resting in bed.  Allergies: Patient has no known allergies.  Medications:  Current Facility-Administered Medications:  .  acetaminophen (TYLENOL) tablet 650 mg, 650 mg, Oral, Q6H PRN, Haydee Monica, MD .  Chlorhexidine Gluconate Cloth 2 % PADS 6 each, 6 each, Topical, Q0600, Rolly Salter, MD, 6 each at 07/09/18 [phone] .  fentaNYL (SUBLIMAZE) injection 12.5-25 mcg, 12.5-25 mcg, Intravenous, Q3H PRN, Rolly Salter, MD .  ferrous sulfate tablet 325 mg, 325 mg, Oral, BID WC, Tarry Kos A, MD, 325 mg at 07/09/18 1843 .  HYDROcodone-acetaminophen (NORCO/VICODIN) 5-325 MG per tablet 1 tablet, 1 tablet, Oral, Q6H PRN, Rolly Salter, MD .  lactated ringers infusion, , Intravenous, Continuous, Phillips Grout, MD, Stopped at 07/09/18 [phone] .  mupirocin ointment (BACTROBAN) 2 % 1 application, 1 application, Nasal, BID, Rolly Salter, MD, 1 application at 07/09/18 2209 .  polyethylene glycol (MIRALAX / GLYCOLAX) packet 17 g, 17 g, Oral, Daily PRN, Tarry Kos A, MD .  tamsulosin (FLOMAX) capsule 0.4 mg, 0.4 mg, Oral, Daily, Tarry Kos A, MD, 0.4 mg at 07/09/18 0807 .  vancomycin (VANCOCIN) IVPB 750 mg/150 ml premix, 750 mg, Intravenous, Q12H, Rolly Salter, MD, Last Rate: 150 mL/hr at 07/10/18 0556, 750 mg at 07/10/18 0556    Vital Signs: BP 113/71 (BP Location: Right Arm)   Pulse 91   Temp 98.6 F (37 C)   Resp 16   Ht 5\' 9"  (1.753 m)   Wt 79.1 kg   SpO2 97%   BMI 25.75 kg/m   Physical Exam Awake, NAD Chest tube in place on left, no kinks or leaks noted. ~800 mL serosanguinous fluid in the pleurevac and fresh serous fluid in tubing No air  leak  Imaging:  Ct Image Guided Drainage By Percutaneous Catheter  Result Date: 07/08/2018 INDICATION: 68 year old with bacteremia, pneumonia and loculated left pleural effusion. Request for a chest tube placement with image guidance. EXAM: CT-GUIDED PLACEMENT OF LEFT PLEURAL DRAIN MEDICATIONS: No antibiotics given for this procedure. ANESTHESIA/SEDATION: 2.0 mg IV Versed 50 mcg IV Fentanyl Moderate Sedation Time:  24 minutes The patient was continuously monitored during the procedure by the interventional radiology nurse under my direct supervision. COMPLICATIONS: None immediate. TECHNIQUE: Informed written consent was obtained from the patient's wife after a thorough discussion of the procedural risks, benefits and alternatives. All questions were addressed. Maximal Sterile Barrier Technique was utilized including caps, mask, sterile gowns, sterile gloves, sterile drape, hand hygiene and skin antiseptic. A timeout was performed prior to the initiation of the procedure. PROCEDURE: Patient was placed on his right side. CT images through the chest were obtained. The left pleural effusion was also evaluated with ultrasound and one of the larger fluid pockets was targeted. Skin was prepped with chlorhexidine and sterile field was created. 18 gauge trocar needle was directed into the pleural space with CT guidance. Amber fluid was aspirated. Stiff Amplatz wire was advanced into the pleural space. Tract was dilated to accommodate a 14 [place] multipurpose drain. Catheter was sutured to skin and attached to a PleurEvac. Greater than 100 mL of amber colored fluid was removed when the catheter was placed to suction. Dressing was placed over the drain. FINDINGS:  Complex left pleural effusion. Fluid is very loculated by ultrasound. Chest tube was placed along the left posterior aspect of the complex effusion. Greater than 100 mL of amber colored fluid was removed. IMPRESSION: Successful placement a left pleural drain  with CT guidance. Electronically Signed   By: Richarda Overlie M.D.   On: 07/08/2018 17:52     Labs:  CBC: Recent Labs    07/06/18 0218 07/06/18 0737 07/07/18 0835 07/09/18 1725 07/10/18 0652  WBC 15.2*  --  14.2* 8.0 11.7*  HGB 7.7* 9.3* 9.3* 9.7* 9.2*  HCT 24.6* 29.1* 29.9* 33.4* 30.3*  PLT 666*  --  641* 202 559*    COAGS: Recent Labs    02/25/18 1413  05/10/18 0642 05/11/18 0615 05/12/18 0628 05/13/18 0503 07/05/18 1918  INR 1.16  --   --   --   --   --  1.89  APTT  --    < > 54* 53* 51* 54*  --    < > = values in this interval not displayed.    BMP: Recent Labs    07/07/18 0835 07/08/18 0301 07/09/18 1725 07/10/18 0652  NA 137 139 136 135  K 3.7 3.8 3.9 3.5  CL 106 106 107 106  CO2 22 25 23 24   GLUCOSE 96 84 94 101*  BUN 10 7* 7* 6*  CALCIUM 8.4* 8.4* 8.0* 8.1*  CREATININE 0.65 0.70 0.67 0.70  GFRNONAA >60 >60 >60 >60  GFRAA >60 >60 >60 >60    LIVER FUNCTION TESTS: Recent Labs    05/17/18 0659 07/05/18 1918 07/07/18 0835 07/09/18 1725  BILITOT 0.5 0.7 0.4 0.5  AST 20 26 19 17   ALT 16 11 9 8   ALKPHOS 78 96 97 96  PROT 6.2* 6.3* 5.5* 5.8*  ALBUMIN 2.0* 1.8* 1.4* 1.5*    Assessment and Plan:  68 year old with bacteremia, pneumonia and loculated left pleural effusion. R S/P chest tube placement with image guidance on 1/20 by Dr. Lowella Dandy. No kinks noted despite CXR report and has continued output. IR following   Electronically Signed: Brayton El, PA-C 07/10/2018, 8:44 AM    I spent a total of 15 Minutes at the the patient's bedside AND on the patient's hospital floor or unit, greater than 50% of which was counseling/coordinating care for f/u chest tube placement.

## 2018-07-10 NOTE — Progress Notes (Signed)
  Echocardiogram 2D Echocardiogram has been performed.  Celene Skeen 07/10/2018, 10:15 AM

## 2018-07-10 NOTE — Progress Notes (Addendum)
Rehab Admissions Coordinator Note:  Patient was screened by Wayne Buckley for appropriateness for an Inpatient Acute Rehab Consult per PT and OT recommendations. Pt well known to the inpt rehab service with DrMarland Kitchen Riley Kill. Was admitted to CIR on 03/05/2018 and was discharged to [facility] 04/04/2028 for wife unable to handle his care needs at his functional level. Patient with lack of full participation . [name] consulted on pt's last admission on 05/07/2018 and recommended SNF rehab for the reasons stated above. .  At this time, we are recommending Skilled Nursing Facility.  Wayne Dupes RN MSN 07/10/2018, 2:53 PM  I can be reached at [phone].

## 2018-07-10 NOTE — Progress Notes (Signed)
Occupational Therapy Evaluation Patient Details Name: Wayne Buckley MRN: [id number] DOB: 09-09-1950 Today's Date: 07/10/2018    History of Present Illness Pt admitted 07/05/18 with symptomatic anemia and MRSA bacteremia. 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    Clinical Impression   PTA, pt was at [facility] and was receiving therapy services. Pt's wife was present during session and provided pt's history and level of functioning. Pt's wife reports pt was assisting with ADL and ambulating short distances with 2+assistance. Pt currently demonstrates agitation and reluctant to participate during session. Pt agreeable with encouragement from wife. Pt required maxA+2 to progress to EOB, and tolerated sitting EOB for with maxA+2. Pt followed one step commands inconsistently and with increased time. Pt will benefit from skilled OT services to increase independence and safety with ADL and functional mobility to allow d/c to venue listed below.     Follow Up Recommendations  CIR;Supervision/Assistance - 24 hour    Equipment Recommendations       Recommendations for Other Services PT consult     Precautions / Restrictions Precautions Precautions: Fall Restrictions Weight Bearing Restrictions: No      Mobility Bed Mobility Overal bed mobility: Needs Assistance Bed Mobility: Rolling;Supine to Sit;Sit to Supine Rolling: Max assist   Supine to sit: Max assist;+2 for physical assistance;HOB elevated Sit to supine: Max assist;+2 for physical assistance;HOB elevated   General bed mobility comments: pt's wife demonstrated safe and appropriate assistance with transfer;pt sat EOB for 3 min  Transfers                 General transfer comment: pt declined OOB mobility    Balance Overall balance assessment: Needs assistance Sitting-balance support: Bilateral upper extremity supported;Feet supported Sitting balance-[name]  Scale: Zero Sitting balance - Comments: pt required maxA+2 to sit EOB for Postural control: Left lateral lean                                 ADL either performed or assessed with clinical judgement   ADL Overall ADL's : Needs assistance/impaired Eating/Feeding: Moderate assistance;Sitting Eating/Feeding Details (indicate cue type and reason): wife assisted with feeding thisAM reports pt has declined in participation with feeding Grooming: Wash/dry face;Minimal assistance;Sitting Grooming Details (indicate cue type and reason): minA for thorough care Upper Body Bathing: Moderate assistance;Sitting   Lower Body Bathing: Total assistance   Upper Body Dressing : Maximal assistance;Sitting   Lower Body Dressing: Total assistance;Sitting/lateral leans                 General ADL Comments: pt declined OOB mobility;reluctantly agreed to progress to EOB maxA+2;wife reports pt requires more assistance with ADL      Vision Baseline Vision/History: Wears glasses Wears Glasses: At all times       Perception     Praxis      Pertinent Vitals/Pain Pain Assessment: Faces Faces Pain Scale: Hurts even more Pain Location: "all over", unable to specify  Pain Descriptors / Indicators: Grimacing;Moaning Pain Intervention(s): Limited activity within patient's tolerance;Monitored during session     Hand Dominance Right   Extremity/Trunk Assessment Upper Extremity Assessment Upper Extremity Assessment: Difficult to assess due to impaired cognition;RUE deficits/detail RUE Deficits / Details: increased swelling;repositioned and educated wife on importance of positioning and movement of RUE   Lower Extremity Assessment Lower Extremity Assessment: Defer to PT evaluation  Communication Communication Communication: Expressive difficulties   Cognition Arousal/Alertness: Awake/alert Behavior During Therapy: Restless;Agitated;Flat affect Overall Cognitive Status:  History of cognitive impairments - at baseline                                 General Comments: at baseline per wife. patient AO to person place, not situation or time. patient agreeable to therapy with wife's encouragement. follows one step commands inconsistently/with extended time;agitated during therapy session, reluctantly agreed when encouraged by wife   General Comments  pt's wife present during session and provided pt history. educated wife on elevation of RUE to to manage swelling    Exercises Exercises: General Lower Extremity General Exercises - Lower Extremity Ankle Circles/Pumps: 20 reps Quad Sets: 10 reps Heel Slides: 10 reps Hip ABduction/ADduction: 10 reps   Shoulder Instructions      Home Living Family/patient expects to be discharged to:: Skilled nursing facility                                 Additional Comments: Pt was on CIR then went to SNF after previous fall this year      Prior Functioning/Environment Level of Independence: Needs assistance  Gait / Transfers Assistance Needed: per wife was ambulating with +2 assistance  ADL's / Homemaking Assistance Needed: pt was assisting with some ADL at [place], per pt's wife.    Comments: prior to fall in 09/19, patient was ambulating without assistive device living at home independent with all ADLs per wife        OT Problem List: Decreased strength;Impaired balance (sitting and/or standing);Decreased activity tolerance;Decreased cognition;Decreased safety awareness;Cardiopulmonary status limiting activity;Pain      OT Treatment/Interventions: Self-care/ADL training;Therapeutic exercise;Therapeutic activities;Cognitive remediation/compensation;Patient/family education;Balance training    OT Goals(Current goals can be found in the care plan section) Acute Rehab OT Goals Patient Stated Goal: to get stronger OT Goal Formulation: With patient/family Time For Goal Achievement:  07/24/18 Potential to Achieve Goals: Good  OT Frequency: Min 2X/week   Barriers to D/C:            Co-evaluation              AM-PAC OT "6 Clicks" Daily Activity     Outcome Measure Help from another person eating meals?: A Lot Help from another person taking care of personal grooming?: A Lot Help from another person toileting, which includes using toliet, bedpan, or urinal?: Total Help from another person bathing (including washing, rinsing, drying)?: A Lot Help from another person to put on and taking off regular upper body clothing?: A Lot Help from another person to put on and taking off regular lower body clothing?: Total 6 Click Score: 10   End of Session Equipment Utilized During Treatment: Oxygen Nurse Communication: Mobility status  Activity Tolerance: Patient tolerated treatment well Patient left: in bed;with call bell/phone within reach;with bed alarm set;with family/visitor present  OT Visit Diagnosis: Unsteadiness on feet (R26.81);Other abnormalities of gait and mobility (R26.89);Muscle weakness (generalized) (M62.81);History of falling (Z91.81);Other symptoms and signs involving cognitive function;Pain Pain - part of body: (pt unable to verbalize due to cognitive impairments)                Time: 1329-1404 OT Time Calculation (min): 35 min Charges:  OT General Charges $OT Visit: 1 Visit OT Evaluation $OT Eval High Complexity: 1 High OT Treatments $  Self Care/Home Management : 8-22 mins  Diona Browner OTR/L Acute Rehabilitation Services Office: [phone]   Rebeca Alert 07/10/2018, 2:24 PM

## 2018-07-11 ENCOUNTER — Inpatient Hospital Stay (HOSPITAL_COMMUNITY): Payer: Medicare Other

## 2018-07-11 DIAGNOSIS — L8990 Pressure ulcer of unspecified site, unspecified stage: Secondary | ICD-10-CM

## 2018-07-11 DIAGNOSIS — I824Z9 Acute embolism and thrombosis of unspecified deep veins of unspecified distal lower extremity: Secondary | ICD-10-CM

## 2018-07-11 DIAGNOSIS — Z930 Tracheostomy status: Secondary | ICD-10-CM

## 2018-07-11 DIAGNOSIS — J9621 Acute and chronic respiratory failure with hypoxia: Secondary | ICD-10-CM

## 2018-07-11 LAB — PROTIME-INR
INR: 1.34
Prothrombin Time: 16.4 seconds — ABNORMAL HIGH (ref 11.4–15.2)

## 2018-07-11 LAB — CULTURE, BODY FLUID W GRAM STAIN -BOTTLE: Culture: NO GROWTH

## 2018-07-11 LAB — CULTURE, BLOOD (ROUTINE X 2)
Culture: NO GROWTH
Culture: NO GROWTH
SPECIAL REQUESTS: ADEQUATE
SPECIAL REQUESTS: ADEQUATE

## 2018-07-11 LAB — VANCOMYCIN, TROUGH: Vancomycin Tr: 16 ug/mL (ref 15–20)

## 2018-07-11 MED ORDER — POLYETHYLENE GLYCOL 3350 17 G PO PACK
17.0000 g | PACK | Freq: Two times a day (BID) | ORAL | Status: DC
  Administered 2018-07-12 – 2018-07-24 (×16): 17 g via ORAL
  Filled 2018-07-11 (×20): qty 1

## 2018-07-11 MED ORDER — ENOXAPARIN SODIUM 80 MG/0.8ML ~~LOC~~ SOLN
80.0000 mg | Freq: Two times a day (BID) | SUBCUTANEOUS | Status: DC
  Administered 2018-07-11: 80 mg via SUBCUTANEOUS
  Filled 2018-07-11: qty 0.8

## 2018-07-11 MED ORDER — SENNA 8.6 MG PO TABS
1.0000 | ORAL_TABLET | Freq: Every day | ORAL | Status: DC
  Administered 2018-07-15 – 2018-07-22 (×5): 8.6 mg via ORAL
  Filled 2018-07-11 (×9): qty 1

## 2018-07-11 MED ORDER — VANCOMYCIN HCL IN DEXTROSE 1-5 GM/200ML-% IV SOLN
1000.0000 mg | INTRAVENOUS | Status: DC
  Administered 2018-07-12 – 2018-07-25 (×14): 1000 mg via INTRAVENOUS
  Filled 2018-07-11 (×17): qty 200

## 2018-07-11 NOTE — Progress Notes (Signed)
ANTICOAGULATION CONSULT NOTE - Initial Consult  Pharmacy Consult for Lovenox  Indication: DVT  No Known Allergies  Patient Measurements: Height: 5\' 9"  (175.3 cm) Weight: 174 lb 6.1 oz (79.1 kg) IBW/kg (Calculated) : 70.7  Vital Signs: Temp: 100.2 F (37.9 C) (01/23 1659) BP: 117/75 (01/23 1659) Pulse Rate: 104 (01/23 1659)  Labs: Recent Labs    07/09/18 1725 07/10/18 0652 07/11/18 1113  HGB 9.7* 9.2*  --   HCT 33.4* 30.3*  --   PLT 202 559*  --   LABPROT  --   --  16.4*  INR  --   --  1.34  CREATININE 0.67 0.70  --     Estimated Creatinine Clearance: 88.4 mL/min (by C-G formula based on SCr of 0.7 mg/dL).   Medical History: Past Medical History:  Diagnosis Date  . BPH (benign prostatic hyperplasia)   . DVT (deep venous thrombosis) (HCC) 02/2018   extensive LLE DVT  . H/O alcohol abuse   . Hypertension   . Neurogenic bowel   . Spinal cord injury, cervical region (HCC) 02/2018  . TBI (traumatic brain injury) (HCC) 2013   SDH with left partial frontal lobectomy    Medications:  Medications Prior to Admission  Medication Sig Dispense Refill Last Dose  . acetaminophen (TYLENOL) 325 MG tablet Take 2 tablets (650 mg total) by mouth every 6 (six) hours as needed for mild pain.   Past Month at Unknown time  . amLODipine (NORVASC) 5 MG tablet Take 5 mg by mouth daily.   07/05/2018 at    . apixaban (ELIQUIS) 5 MG TABS tablet Take 1 tablet (5 mg total) by mouth 2 (two) times daily. 60 tablet  07/05/2018 at 0935  . bisacodyl (DULCOLAX) 10 MG suppository Place 1 suppository (10 mg total) rectally daily at 6 (six) AM. (Patient taking differently: Place 10 mg rectally daily as needed for mild constipation. ) 12 suppository 0 07/04/2018 at Unknown time  . carvedilol (COREG) 12.5 MG tablet Take 1 tablet (12.5 mg total) by mouth 2 (two) times daily with a meal.   07/05/2018 at 0935  . ferrous sulfate 325 (65 FE) MG tablet Take 1 tablet (325 mg total) by mouth 2 (two) times daily  with a meal.  3 07/05/2018 at Unknown time  . liver oil-zinc oxide (DESITIN) 40 % ointment Apply topically 4 (four) times daily. Apply to sacrum, coccyx, buttocks. (Patient taking differently: Apply 1 application topically 4 (four) times daily. Apply to sacrum, coccyx, buttocks.) 56.7 g 0 07/05/2018 at Unknown time  . polyethylene glycol (MIRALAX / GLYCOLAX) packet Take 17 g by mouth daily as needed for mild constipation or moderate constipation. 14 each 0 07/04/2018 at Unknown time  . senna-docusate (SENOKOT-S) 8.6-50 MG tablet Take 1 tablet by mouth 2 (two) times daily. (Patient taking differently: Take 1 tablet by mouth 2 (two) times daily as needed (constipation). )   07/04/2018 at Unknown time  . sodium phosphate (FLEET) 7-19 GM/118ML ENEM Place 133 mLs (1 enema total) rectally daily as needed for severe constipation.  0 unk  . tamsulosin (FLOMAX) 0.4 MG CAPS capsule Take 1 capsule (0.4 mg total) by mouth daily. 30 capsule  07/04/2018 at Unknown time  . vitamin C (VITAMIN C) 1000 MG tablet Take 1 tablet (1,000 mg total) by mouth 2 (two) times daily.   07/05/2018 at Unknown time  . amLODipine (NORVASC) 10 MG tablet Take 1 tablet (10 mg total) by mouth daily. (Patient not taking: Reported on 07/05/2018)  Not Taking at Unknown time  . collagenase (SANTYL) ointment Apply topically daily. Cleanse gluteal wounds with NS.  Apply Santyl to open areas.  Cover with NS moist 2x2 and foam dressings.  Peel back foam and change daily. Foam is changed every three days and PRN soilage. (Patient not taking: Reported on 07/05/2018) 15 g 0 Not Taking at Unknown time  . levofloxacin (LEVAQUIN) 750 MG tablet Take 750 mg by mouth daily. For 7 days. Start date 06/25/2018. End date 07/01/2018   07/01/2018  . polyethylene glycol (MIRALAX / GLYCOLAX) packet Take 17 g by mouth 2 (two) times daily. 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 He is s/p drain placement and prolonged IV antibiotics.   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 He received a few doses upon admission but anticoagulation has been held since 1/19 for placement of CT guided drain. Pharmacy consulted to start Lovenox for now until cleared by neurosurgery.   CrCl ~ 88 mL/min. H/H low stable. Plt high.   Goal of Therapy:  Anti-Xa level 0.6-1 units/ml 4hrs after LMWH dose given Monitor platelets by anticoagulation protocol: Yes   Plan:  -Start Lovenox 80 mg (1 mg/kg) SQ BID  -Monitor renal function and f/u neurosurgery plans  -Monitor for bleeding   Vinnie Level, PharmD., BCPS Clinical Pharmacist Clinical phone for 07/11/18 until 10:30pm: [phone] If after 10:30pm, please refer to [facility] for unit-specific pharmacist

## 2018-07-11 NOTE — Progress Notes (Signed)
PROGRESS NOTE  Wayne Buckley FAO:[id number] DOB: 15-Sep-1950 DOA: 07/05/2018 PCP: Patient, No Pcp Per  Brief History   68 year old male PMH TBI, subdural hemorrhage with left partial frontal lobectomy, cervical spinal cord injury, presented for evaluation of suspected anemia, was admitted for pneumonia.  Was subsequently found to have recurrent MRSA bacteremia with left-sided pneumonia, loculated parapneumonic effusion, thoracic and lumbar spine discitis.  He underwent CT-guided chest tube placement and is followed by pulmonology and interventional radiology.  Given abnormal MRI findings, he has been followed by neurosurgery.  A & P  Recurrent MRSA bacteremia with left-sided pneumonia with loculated left effusion/empyema, discitis and osteomyelitis of the thoracic and lumbar vertebra, thoracic paraspinous abscesses, subdural fluid collection T6-T10, epidural abscess T11-T12.  Not a candidate for TEE in the past secondary to possible esophageal obstruction. --One episode of fever yesterday.  Repeat blood cultures remain no growth to date. --Status post CT-guided drain placement, continue management per interventional radiology and pulmonary medicine. --Further recommendations and management as per neurosurgery with no operative intervention recommended up to this point --Continue prolonged IV antibiotics as recommended by infectious disease, 8-10 weeks with repeat imaging in 6-8 weeks. Tunnel PICC given involuntary movements in arms --Speech therapy felt the patient was mild aspiration risk and recommended regular diet with thin liquids on 1/21.  PMH traumatic brain injury, PMH C5-C7 spinal cord injury, chronic involuntary movements of the extremities, chronic urinary retention with chronic indwelling Foley catheter  Essential hypertension --Stable.  Stable off amlodipine and carvedilol  Iron deficiency anemia.  Continue iron supplementation.  Status post IV iron.  Status post transfusion 3 units  PRBC. --stable  PMH DVT --Resume apixaban in near future.  For now Lovenox until ultimately cleared by neurosurgery.  Unstageable pressure injury of the bilateral buttocks present on admission --Continue wound care   PMH TBI, cord compression.  Resident in SNF.  Chronic Foley for neurogenic bladder.  Complex case, await further recommendations from neurosurgery, continue management per pulmonology.  DVT prophylaxis: enoxaparin Code Status: Full Family Communication: wife at bedside Disposition Plan: SNF    Brendia Sacks, MD  [facility] Direct contact: see [email or web address]  7PM-7AM contact night coverage as above 07/11/2018, 10:57 AM  LOS: 5 days   Consultants  . ID . Neurosurgery  . IR . Pulmonology  Procedures  . Diagnostic left thoracentesis . CT-guided left chest tube placement 1/20 . MRI under anesthesia . Echo Study Conclusions  - Left ventricle: The cavity size was normal. Systolic function was   normal. The estimated ejection fraction was in the range of 60%   to 65%. Wall motion was normal; there were no regional wall   motion abnormalities. Doppler parameters are consistent with   abnormal left ventricular relaxation (grade 1 diastolic   dysfunction). - Ventricular septum: Septal motion showed &quot;bounce&quot;. - Aortic valve: Transvalvular velocity was within the normal range.   There was no stenosis. There was no regurgitation. - Mitral valve: Transvalvular velocity was within the normal range.   There was no evidence for stenosis. There was no regurgitation. - Right ventricle: The cavity size was normal. Wall thickness was   normal. Systolic function was normal. - Atrial septum: No defect or patent foramen ovale was identified   by color flow Doppler. - Tricuspid valve: There was no regurgitation.  Antibiotics  .   Interval History/Subjective  Seems to feel okay, eating okay.  No pain.  Breathing okay.  Objective   Vitals:  Vitals:     07/10/18  2358 07/11/18 0814  BP:  115/65  Pulse:  92  Resp:    Temp: 98.7 F (37.1 C) (!) 97 F (36.1 C)  SpO2:  96%    Exam:  Constitutional:   . Appears calm and comfortable Eyes:  . pupils and irises appear normal ENMT:  . grossly normal hearing  . Tongue appears unremarkable Respiratory:  . CTA bilaterally, no w/r/r.  . Respiratory effort normal.  Cardiovascular:  . RRR, no m/r/g . No significant LE extremity edema   Abdomen:  . Soft Musculoskeletal:  . Involuntary movements bilateral upper extremities noted Psychiatric:  . Mental status o Mood, affect appropriate  I have personally reviewed the following:   Today's Data  . INR 1.34  Lab Data  .   Micro Data  .   Imaging   CT chest showed large loculated left pleural effusion with upper and lower lobe consolidation.  Multiple MRIs noted  Cardiology Data  .   Other Data  .   Scheduled Meds: . Chlorhexidine Gluconate Cloth  6 each Topical Q0600  . ferrous sulfate  325 mg Oral BID WC  . tamsulosin  0.4 mg Oral Daily   Continuous Infusions: . lactated ringers Stopped (07/09/18 4097)  . vancomycin 750 mg (07/11/18 1030)    Principal Problem:   Vertebral osteomyelitis (HCC) Active Problems:   Urinary retention   C5-C7 level spinal cord injury (HCC)   Essential hypertension   Chronic anemia   Traumatic brain injury (HCC)   MRSA bacteremia   Pleural effusion on left   Pressure injury of skin   LOS: 5 days   Discussed with wife at bedside.  Time in review of chart and coordination of care 40 minutes

## 2018-07-11 NOTE — Progress Notes (Signed)
Pharmacy Antibiotic Note  Wayne Buckley is a 68 y.o. male admitted on 07/05/2018 with Thoracolumbar osteomyelitis, Empyema, Bacteremia - MRSA.  Pharmacy has been consulted for vancomycin dosing. Peak 41 Trough 16 AUC is 662 on vanc 750 mg IV q 12 hr  SCr 0.7  Plan: Adjust Vancomycin to 1000 mg IV every 24 hours. (projected new AUC 441) Monitor clinical progress, cultures/sensitivities, renal function, abx plan   Height: 5\' 9"  (175.3 cm) Weight: 174 lb 6.1 oz (79.1 kg) IBW/kg (Calculated) : 70.7  Temp (24hrs), Avg:99.5 F (37.5 C), Min:97 F (36.1 C), Max:101.9 F (38.8 C)  Recent Labs  Lab 07/05/18 1918 07/05/18 2059 07/06/18 0218 07/07/18 0231 07/07/18 0835 07/07/18 1104 07/08/18 0301 07/09/18 1725 07/10/18 0652 07/11/18 0618  WBC 18.4*  --  15.2*  --  14.2*  --   --  8.0 11.7*  --   CREATININE 0.90  --  0.83  --  0.65  --  0.70 0.67 0.70  --   LATICACIDVEN  --  0.77  --   --   --   --   --   --   --   --   VANCOTROUGH  --   --   --   --   --  21*  --   --   --  16  VANCOPEAK  --   --   --  15*  --   --   --   --  41*  --     Estimated Creatinine Clearance: 88.4 mL/min (by C-G formula based on SCr of 0.7 mg/dL).    No Known Allergies   Thank you for allowing [place] to participate in this patients care.   Signe Colt, PharmD Please utilize [employer] (under [facility]) for appropriate number for your unit pharmacist. 07/11/2018 3:36 PM

## 2018-07-11 NOTE — Progress Notes (Signed)
SLP Cancellation Note  Patient Details Name: DAERON AARDEMA MRN: [id number] DOB: [birth year]   Cancelled treatment:       Reason Eval/Treat Not Completed: Other (comment) Pt NPO for procedure the pm, but confirmed with wife and pt that pt has been tolerating regular solids and liquids. No SLP f/u needed will sign off.   Harlon Ditty, MA CCC-SLP  Acute Rehabilitation Services Pager [phone] Office [phone]   Claudine Mouton 07/11/2018, 2:44 PM

## 2018-07-11 NOTE — Progress Notes (Signed)
Referring Physician(s): Dr Alanda Slim  Supervising Physician: Malachy Moan  Patient Status:  [facility] - In-pt  Chief Complaint:  Left chest tube placed 1/20 Need for home long term antibiotics  Subjective:  Left chest tube intact Working well 910 cc in Pleurvac No air leak Recurrent MRSA bacteremia; PNA Epidural abscess  Scheduled today for tunneled central catheter placement for long term antibiotics Pt has involuntary movements; TBI; C5-C7 spinal cord injury Difficult for PICC team-- pt had central catheter placed in IR 04/2018-- did well with it-- and wife prefers same  Allergies: Patient has no known allergies.  Medications: Prior to Admission medications   Medication Sig Start Date End Date Taking? Authorizing Provider  acetaminophen (TYLENOL) 325 MG tablet Take 2 tablets (650 mg total) by mouth every 6 (six) hours as needed for mild pain. 04/03/18  Yes Angiulli, Mcarthur Rossetti, PA-C  amLODipine (NORVASC) 5 MG tablet Take 5 mg by mouth daily.   Yes [provider]  apixaban (ELIQUIS) 5 MG TABS tablet Take 1 tablet (5 mg total) by mouth 2 (two) times daily. 04/03/18  Yes Angiulli, Mcarthur Rossetti, PA-C  bisacodyl (DULCOLAX) 10 MG suppository Place 1 suppository (10 mg total) rectally daily at 6 (six) AM. Patient taking differently: Place 10 mg rectally daily as needed for mild constipation.  04/03/18  Yes Angiulli, Mcarthur Rossetti, PA-C  carvedilol (COREG) 12.5 MG tablet Take 1 tablet (12.5 mg total) by mouth 2 (two) times daily with a meal. 04/03/18  Yes Angiulli, Mcarthur Rossetti, PA-C  ferrous sulfate 325 (65 FE) MG tablet Take 1 tablet (325 mg total) by mouth 2 (two) times daily with a meal. 05/31/18  Yes Calvert Cantor, MD  liver oil-zinc oxide (DESITIN) 40 % ointment Apply topically 4 (four) times daily. Apply to sacrum, coccyx, buttocks. Patient taking differently: Apply 1 application topically 4 (four) times daily. Apply to sacrum, coccyx, buttocks. 05/14/18  Yes Rai, Ripudeep K,  MD  polyethylene glycol (MIRALAX / GLYCOLAX) packet Take 17 g by mouth daily as needed for mild constipation or moderate constipation. 05/31/18  Yes Rizwan, Ladell Heads, MD  senna-docusate (SENOKOT-S) 8.6-50 MG tablet Take 1 tablet by mouth 2 (two) times daily. Patient taking differently: Take 1 tablet by mouth 2 (two) times daily as needed (constipation).  05/14/18  Yes Rai, Ripudeep K, MD  sodium phosphate (FLEET) 7-19 GM/118ML ENEM Place 133 mLs (1 enema total) rectally daily as needed for severe constipation. 05/31/18  Yes Calvert Cantor, MD  tamsulosin (FLOMAX) 0.4 MG CAPS capsule Take 1 capsule (0.4 mg total) by mouth daily. 03/06/18  Yes Leroy Sea, MD  vitamin C (VITAMIN C) 1000 MG tablet Take 1 tablet (1,000 mg total) by mouth 2 (two) times daily. 04/03/18  Yes Angiulli, Mcarthur Rossetti, PA-C  amLODipine (NORVASC) 10 MG tablet Take 1 tablet (10 mg total) by mouth daily. Patient not taking: Reported on 07/05/2018 03/06/18   Leroy Sea, MD  collagenase (SANTYL) ointment Apply topically daily. Cleanse gluteal wounds with NS.  Apply Santyl to open areas.  Cover with NS moist 2x2 and foam dressings.  Peel back foam and change daily. Foam is changed every three days and PRN soilage. Patient not taking: Reported on 07/05/2018 05/15/18   Rai, Delene Ruffini, MD  levofloxacin (LEVAQUIN) 750 MG tablet Take 750 mg by mouth daily. For 7 days. Start date 06/25/2018. End date 07/01/2018    [provider]  polyethylene glycol (MIRALAX / GLYCOLAX) packet Take 17 g by mouth 2 (two)  times daily. Patient not taking: Reported on 07/05/2018 05/14/18   Cathren Harsh, MD     Vital Signs: BP 115/65 (BP Location: Right Arm)   Pulse 92   Temp (!) 97 F (36.1 C)   Resp 14   Ht 5\' 9"  (1.753 m)   Wt 174 lb 6.1 oz (79.1 kg)   SpO2 96%   BMI 25.75 kg/m   Physical Exam Vitals signs reviewed.  Skin:    General: Skin is warm and dry.     Comments: Site of Left chest tube is clean and dry NT No  bleeding  No air leak at pleurvac OP 910 cc serous fluid      Imaging: Mr Cervical Spine W Wo Contrast  Result Date: 07/08/2018 CLINICAL DATA:  Back pain.  Rule out spinal infection. EXAM: MRI TOTAL SPINE WITHOUT AND WITH CONTRAST TECHNIQUE: Multisequence MR imaging of the spine from the cervical spine to the sacrum was performed prior to and following IV contrast administration for evaluation of spinal metastatic disease. CONTRAST:  7 mL Gadovist IV COMPARISON:  CT chest 07/06/2018, CT total spine I will 04/2018 FINDINGS: MRI was done under general anesthesia. MRI CERVICAL SPINE FINDINGS Alignment: Normal alignment. Moderate kyphosis of the cervical spine Vertebrae: Negative for fracture or mass. No evidence of cervical discitis. Multilevel degenerative change. ACDF C6-7 with anterior plate Cord: Mild hyperintensity in the cord at C6-7 as noted on the prior study. No cord compression. Posterior Fossa, vertebral arteries, paraspinal tissues: The patient is intubated. Fluid in the pharynx. No abscess or mass in the neck. Disc levels: Advanced multilevel disc and facet degeneration throughout the cervical spine. No cord compression or significant spinal stenosis. Severe left foraminal narrowing due to spurring at C2-3, C3-4, and C4-5 MRI THORACIC SPINE FINDINGS Alignment:  Normal Vertebrae: Negative for fracture. Hemangioma T8 vertebral body unchanged. Bone marrow diffusely low signal on T1 and T2-T2 related to anemia. Recent IV iron therapy. Cord: Normal spinal cord signal. Posterior subdural or epidural fluid collection posteriorly from approximately T6 through T10. Given the findings of disc space infection in the lower thoracic spine, this is most likely abscess. No significant cord deformity. Paraspinal and other soft tissues: Moderately large loculated left pleural effusion. Small right effusion. Paraspinous fluid collections bilaterally at the T12 level most likely paraspinous abscesses. Disc levels:  Disc space narrowing, endplate erosion, and endplate enhancement at T10-11, T11-T12, T12-L1 compatible with discitis and osteomyelitis. Marked degenerative changes of these levels with mild spinal stenosis at T10-11, moderate spinal stenosis at T11-12 and T12-L1. Extensive foraminal encroachment bilaterally T11-12 and T12-L1 due to spurring. There is circumferential epidural thickening and T11 and T12 which is likely due to epidural abscess. MRI LUMBAR SPINE FINDINGS Segmentation:  Normal Alignment:  Normal Vertebrae: Diffusely abnormal bone marrow which is low signal on T1 and T2 compatible with chronic anemia. Recent IV iron infusion therapy. Conus medullaris: Extends to the T12-L1 level. Moderate spinal stenosis at T11-12 and T12-L1 due to spondylosis and soft tissue thickening in the epidural space related to disc space infection. No definite signal abnormality in the conus medullaris. Paraspinal and other soft tissues: Paraspinous fluid collections bilaterally at T12-L1 extending into the proximal psoas muscle compatible with abscesses Disc levels: Discitis and osteomyelitis at T11-12, T12-L1, L1-2, L4-5. These levels show disc space edema, enhancement and endplate erosions. Erosion most severe at T12-L1. Circumferential epidural thickening and enhancement at T11 and T12 due to abscess. Multilevel spondylosis throughout the lumbar spine. Spinal and foraminal  stenosis is seen throughout the lumbar spine most severe at L4-5 where there is moderate to advanced spinal stenosis. IMPRESSION: 1. Negative for infection in the cervical spine. Extensive multilevel spondylosis without significant spinal stenosis. Marked left foraminal narrowing at C2-3, C3-4, C4-5 due to spurring. 2. 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 3. Paraspinous abscesses bilaterally at T11 and T12 extending into the psoas muscle bilaterally. Loculated left effusion most likely infected. 4. Posterior subdural fluid  collection extending from T6 through T10 compatible with abscess. 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 5. Moderate spinal stenosis due to degenerative change throughout the lower thoracic and entire lumbar spine. Moderate stenosis of the canal at T11-T12 and T12-L1. Moderate to severe stenosis at L4-5. Electronically Signed   By: Marlan Palau M.D.   On: 07/08/2018 15:04   Mr Thoracic Spine W Wo Contrast  Result Date: 07/08/2018 CLINICAL DATA:  Back pain.  Rule out spinal infection. EXAM: MRI TOTAL SPINE WITHOUT AND WITH CONTRAST TECHNIQUE: Multisequence MR imaging of the spine from the cervical spine to the sacrum was performed prior to and following IV contrast administration for evaluation of spinal metastatic disease. CONTRAST:  7 mL Gadovist IV COMPARISON:  CT chest 07/06/2018, CT total spine I will 04/2018 FINDINGS: MRI was done under general anesthesia. MRI CERVICAL SPINE FINDINGS Alignment: Normal alignment. Moderate kyphosis of the cervical spine Vertebrae: Negative for fracture or mass. No evidence of cervical discitis. Multilevel degenerative change. ACDF C6-7 with anterior plate Cord: Mild hyperintensity in the cord at C6-7 as noted on the prior study. No cord compression. Posterior Fossa, vertebral arteries, paraspinal tissues: The patient is intubated. Fluid in the pharynx. No abscess or mass in the neck. Disc levels: Advanced multilevel disc and facet degeneration throughout the cervical spine. No cord compression or significant spinal stenosis. Severe left foraminal narrowing due to spurring at C2-3, C3-4, and C4-5 MRI THORACIC SPINE FINDINGS Alignment:  Normal Vertebrae: Negative for fracture. Hemangioma T8 vertebral body unchanged. Bone marrow diffusely low signal on T1 and T2-T2 related to anemia. Recent IV iron therapy. Cord: Normal spinal cord signal. Posterior subdural or epidural fluid collection posteriorly from approximately T6 through T10. Given the findings of disc  space infection in the lower thoracic spine, this is most likely abscess. No significant cord deformity. Paraspinal and other soft tissues: Moderately large loculated left pleural effusion. Small right effusion. Paraspinous fluid collections bilaterally at the T12 level most likely paraspinous abscesses. Disc levels: Disc space narrowing, endplate erosion, and endplate enhancement at T10-11, T11-T12, T12-L1 compatible with discitis and osteomyelitis. Marked degenerative changes of these levels with mild spinal stenosis at T10-11, moderate spinal stenosis at T11-12 and T12-L1. Extensive foraminal encroachment bilaterally T11-12 and T12-L1 due to spurring. There is circumferential epidural thickening and T11 and T12 which is likely due to epidural abscess. MRI LUMBAR SPINE FINDINGS Segmentation:  Normal Alignment:  Normal Vertebrae: Diffusely abnormal bone marrow which is low signal on T1 and T2 compatible with chronic anemia. Recent IV iron infusion therapy. Conus medullaris: Extends to the T12-L1 level. Moderate spinal stenosis at T11-12 and T12-L1 due to spondylosis and soft tissue thickening in the epidural space related to disc space infection. No definite signal abnormality in the conus medullaris. Paraspinal and other soft tissues: Paraspinous fluid collections bilaterally at T12-L1 extending into the proximal psoas muscle compatible with abscesses Disc levels: Discitis and osteomyelitis at T11-12, T12-L1, L1-2, L4-5. These levels show disc space edema, enhancement and endplate erosions. Erosion  most severe at T12-L1. Circumferential epidural thickening and enhancement at T11 and T12 due to abscess. Multilevel spondylosis throughout the lumbar spine. Spinal and foraminal stenosis is seen throughout the lumbar spine most severe at L4-5 where there is moderate to advanced spinal stenosis. IMPRESSION: 1. Negative for infection in the cervical spine. Extensive multilevel spondylosis without significant spinal  stenosis. Marked left foraminal narrowing at C2-3, C3-4, C4-5 due to spurring. 2. 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 3. Paraspinous abscesses bilaterally at T11 and T12 extending into the psoas muscle bilaterally. Loculated left effusion most likely infected. 4. Posterior subdural fluid collection extending from T6 through T10 compatible with abscess. 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 5. Moderate spinal stenosis due to degenerative change throughout the lower thoracic and entire lumbar spine. Moderate stenosis of the canal at T11-T12 and T12-L1. Moderate to severe stenosis at L4-5. Electronically Signed   By: Marlan Palauharles  Clark M.D.   On: 07/08/2018 15:04   Mr Lumbar Spine W Wo Contrast  Result Date: 07/08/2018 CLINICAL DATA:  Back pain.  Rule out spinal infection. EXAM: MRI TOTAL SPINE WITHOUT AND WITH CONTRAST TECHNIQUE: Multisequence MR imaging of the spine from the cervical spine to the sacrum was performed prior to and following IV contrast administration for evaluation of spinal metastatic disease. CONTRAST:  7 mL Gadovist IV COMPARISON:  CT chest 07/06/2018, CT total spine I will 04/2018 FINDINGS: MRI was done under general anesthesia. MRI CERVICAL SPINE FINDINGS Alignment: Normal alignment. Moderate kyphosis of the cervical spine Vertebrae: Negative for fracture or mass. No evidence of cervical discitis. Multilevel degenerative change. ACDF C6-7 with anterior plate Cord: Mild hyperintensity in the cord at C6-7 as noted on the prior study. No cord compression. Posterior Fossa, vertebral arteries, paraspinal tissues: The patient is intubated. Fluid in the pharynx. No abscess or mass in the neck. Disc levels: Advanced multilevel disc and facet degeneration throughout the cervical spine. No cord compression or significant spinal stenosis. Severe left foraminal narrowing due to spurring at C2-3, C3-4, and C4-5 MRI THORACIC SPINE FINDINGS Alignment:  Normal  Vertebrae: Negative for fracture. Hemangioma T8 vertebral body unchanged. Bone marrow diffusely low signal on T1 and T2-T2 related to anemia. Recent IV iron therapy. Cord: Normal spinal cord signal. Posterior subdural or epidural fluid collection posteriorly from approximately T6 through T10. Given the findings of disc space infection in the lower thoracic spine, this is most likely abscess. No significant cord deformity. Paraspinal and other soft tissues: Moderately large loculated left pleural effusion. Small right effusion. Paraspinous fluid collections bilaterally at the T12 level most likely paraspinous abscesses. Disc levels: Disc space narrowing, endplate erosion, and endplate enhancement at T10-11, T11-T12, T12-L1 compatible with discitis and osteomyelitis. Marked degenerative changes of these levels with mild spinal stenosis at T10-11, moderate spinal stenosis at T11-12 and T12-L1. Extensive foraminal encroachment bilaterally T11-12 and T12-L1 due to spurring. There is circumferential epidural thickening and T11 and T12 which is likely due to epidural abscess. MRI LUMBAR SPINE FINDINGS Segmentation:  Normal Alignment:  Normal Vertebrae: Diffusely abnormal bone marrow which is low signal on T1 and T2 compatible with chronic anemia. Recent IV iron infusion therapy. Conus medullaris: Extends to the T12-L1 level. Moderate spinal stenosis at T11-12 and T12-L1 due to spondylosis and soft tissue thickening in the epidural space related to disc space infection. No definite signal abnormality in the conus medullaris. Paraspinal and other soft tissues: Paraspinous fluid collections bilaterally at T12-L1 extending into the proximal psoas  muscle compatible with abscesses Disc levels: Discitis and osteomyelitis at T11-12, T12-L1, L1-2, L4-5. These levels show disc space edema, enhancement and endplate erosions. Erosion most severe at T12-L1. Circumferential epidural thickening and enhancement at T11 and T12 due to  abscess. Multilevel spondylosis throughout the lumbar spine. Spinal and foraminal stenosis is seen throughout the lumbar spine most severe at L4-5 where there is moderate to advanced spinal stenosis. IMPRESSION: 1. Negative for infection in the cervical spine. Extensive multilevel spondylosis without significant spinal stenosis. Marked left foraminal narrowing at C2-3, C3-4, C4-5 due to spurring. 2. 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 3. Paraspinous abscesses bilaterally at T11 and T12 extending into the psoas muscle bilaterally. Loculated left effusion most likely infected. 4. Posterior subdural fluid collection extending from T6 through T10 compatible with abscess. 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 5. Moderate spinal stenosis due to degenerative change throughout the lower thoracic and entire lumbar spine. Moderate stenosis of the canal at T11-T12 and T12-L1. Moderate to severe stenosis at L4-5. Electronically Signed   By: Marlan Palau M.D.   On: 07/08/2018 15:04   Dg Chest Port 1 View  Result Date: 07/10/2018 CLINICAL DATA:  Respiratory failure EXAM: PORTABLE CHEST 1 VIEW COMPARISON:  07/06/2018 FINDINGS: Cardiac shadow is stable. New left-sided pleural drain is noted although kinked possibly at the skin surface. Clinical correlation is recommended. Large right-sided pleural effusion remains. Underlying calcified adenopathy is seen. The right lung remains clear. IMPRESSION: New left pleural drain with persistent left pleural effusion. The drain is kinked likely at the skin surface. Clinical correlation is recommended. Electronically Signed   By: Alcide Clever M.D.   On: 07/10/2018 08:08   Ct Image Guided Drainage By Percutaneous Catheter  Result Date: 07/08/2018 INDICATION: 68 year old with bacteremia, pneumonia and loculated left pleural effusion. Request for a chest tube placement with image guidance. EXAM: CT-GUIDED PLACEMENT OF LEFT PLEURAL DRAIN  MEDICATIONS: No antibiotics given for this procedure. ANESTHESIA/SEDATION: 2.0 mg IV Versed 50 mcg IV Fentanyl Moderate Sedation Time:  24 minutes The patient was continuously monitored during the procedure by the interventional radiology nurse under my direct supervision. COMPLICATIONS: None immediate. TECHNIQUE: Informed written consent was obtained from the patient's wife after a thorough discussion of the procedural risks, benefits and alternatives. All questions were addressed. Maximal Sterile Barrier Technique was utilized including caps, mask, sterile gowns, sterile gloves, sterile drape, hand hygiene and skin antiseptic. A timeout was performed prior to the initiation of the procedure. PROCEDURE: Patient was placed on his right side. CT images through the chest were obtained. The left pleural effusion was also evaluated with ultrasound and one of the larger fluid pockets was targeted. Skin was prepped with chlorhexidine and sterile field was created. 18 gauge trocar needle was directed into the pleural space with CT guidance. Amber fluid was aspirated. Stiff Amplatz wire was advanced into the pleural space. Tract was dilated to accommodate a 14 [place] multipurpose drain. Catheter was sutured to skin and attached to a PleurEvac. Greater than 100 mL of amber colored fluid was removed when the catheter was placed to suction. Dressing was placed over the drain. FINDINGS: Complex left pleural effusion. Fluid is very loculated by ultrasound. Chest tube was placed along the left posterior aspect of the complex effusion. Greater than 100 mL of amber colored fluid was removed. IMPRESSION: Successful placement a left pleural drain with CT guidance. Electronically Signed   By: Richarda Overlie M.D.   On: 07/08/2018 17:52  [place] Ekg Site Rite  Result Date: 07/09/2018 If Site Rite image not attached, placement could not be confirmed due to current cardiac rhythm.   Labs:  CBC: Recent Labs    07/06/18 0218  07/06/18 0737 07/07/18 0835 07/09/18 1725 07/10/18 0652  WBC 15.2*  --  14.2* 8.0 11.7*  HGB 7.7* 9.3* 9.3* 9.7* 9.2*  HCT 24.6* 29.1* 29.9* 33.4* 30.3*  PLT 666*  --  641* 202 559*    COAGS: Recent Labs    02/25/18 1413  05/10/18 0642 05/11/18 0615 05/12/18 0628 05/13/18 0503 07/05/18 1918  INR 1.16  --   --   --   --   --  1.89  APTT  --    < > 54* 53* 51* 54*  --    < > = values in this interval not displayed.    BMP: Recent Labs    07/07/18 0835 07/08/18 0301 07/09/18 1725 07/10/18 0652  NA 137 139 136 135  K 3.7 3.8 3.9 3.5  CL 106 106 107 106  CO2 22 25 23 24   GLUCOSE 96 84 94 101*  BUN 10 7* 7* 6*  CALCIUM 8.4* 8.4* 8.0* 8.1*  CREATININE 0.65 0.70 0.67 0.70  GFRNONAA >60 >60 >60 >60  GFRAA >60 >60 >60 >60    LIVER FUNCTION TESTS: Recent Labs    05/17/18 0659 07/05/18 1918 07/07/18 0835 07/09/18 1725  BILITOT 0.5 0.7 0.4 0.5  AST 20 26 19 17   ALT 16 11 9 8   ALKPHOS 78 96 97 96  PROT 6.2* 6.3* 5.5* 5.8*  ALBUMIN 2.0* 1.8* 1.4* 1.5*    Assessment and Plan:  Recurrent MRSA bacteremia Left PNA Left chest tube in place + epidural abscess Need for IV home- long term antibiotics Scheduled for tunneled central catheter placement Pt and wife aware of procedure benefits and risks including but not limited to Infection; bleeding; vessel damage Agreeable to proceed Consent signed with wife at bedside  Electronically Signed: Robet LeuPamela A Reah Justo, PA-C 07/11/2018, 10:35 AM   I spent a total of 25 Minutes at the the patient's bedside AND on the patient's hospital floor or unit, greater than 50% of which was counseling/coordinating care for tunneled central catheter placement; left chest tube drain follow up

## 2018-07-11 NOTE — Progress Notes (Signed)
PHARMACY CONSULT NOTE FOR:  OUTPATIENT  PARENTERAL ANTIBIOTIC THERAPY (OPAT)  Indication: Thoracolumbar osteomyelitis, Empyema, Bacteremia - MRSA Regimen: Vancomycin 1000 mg IV q 24 hours End date: 09/14/2018  IV antibiotic discharge orders are pended. To discharging provider:  please sign these orders via discharge navigator,  Select New Orders & click on the button choice - Manage This Unsigned Work.     Thank you for allowing [place] to participate in this patients care.   Signe Colt, PharmD Please utilize [facility] (under [facility]) for appropriate number for your unit pharmacist. 07/11/2018 3:37 PM

## 2018-07-11 NOTE — Progress Notes (Addendum)
NAME:  Wayne Buckley, MRN:  [id number], DOB:  11/20/1950, LOS: 5 ADMISSION DATE:  07/05/2018, CONSULTATION DATE:  1/19 REFERRING MD:  Dr. Lynden OxfordPranav Patel [facility]), CHIEF COMPLAINT:  Pleural Effusion   Brief History   68 yo M, admitted 1/17, recent admission for MRSA bacteremia & UTI in setting of chronic foley sent to [facility] ED from rehab facility for acute on chronic anemia requiring transfusion.  ECHO during prior admit showed no evidence of vegetation (unable to perform TEE). He was discharged to SNF on 1/13.    Found to have left sided pneumonia with parapneumonic effusion. Blood cultures on admission grew MRSA bacteremia. Underwent US guided thoracentesis 1/18 that yielded 90 cc of fluid. Unable to aspiration additional fluid due to loculations. CT Chest with large loculated effusion and evidence of T11-T12 and T12-L1 diskitis / osteomyelitis.   Past Medical History  HTN, chronic anemia requiring recurrent transfusion (no clinical bleeding), history of TBI with neurogenic bladder, chronic indwelling foley, and DVT  Significant Hospital Events   1/17 Re admit from SNF (recent discharge for MRSA bacteremia)   Consults:  PCCM  ID IR  Procedures:  1/18 US guided thoracentesis >> LDH 146, Protein 3.9, WBC 1,755 1/21 Left pigtail chest tube drain placement per IR  Significant Diagnostic Tests:  1/18 CTA Chest >> negative for PE. 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 Posterior subdural fluid collection extending from T6 through T10 compatible with abscess. 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  Discitis and osteomyelitis at  T10-11, T11-T12, T12-L1, L1-2,L4-5 . Per Neurosurgery, does not need surgery now.  Loculated left effusion, concern for infection.     Micro Data:  1/17 Blood cx >> MRSA 1/17 Urine cx >> Negative 1/18 Thoracentesis fluid >>  1/18 Blood Culture >>  Pleural fluid 1/22 >>   Antimicrobials:  1/17 Cefepime x 1 1/17 Metronidazole x 1 1/17 Vancomycin >>   Interim history/subjective:  Denies complaints.  CT draining clear yellow fluid.     Objective   Blood pressure 115/65, pulse 92, temperature (!) 97 F (36.1 C), resp. rate 14, height 5\' 9"  (1.753 m), weight 79.1 kg, SpO2 96 %.        Intake/Output Summary (Last 24 hours) at 07/11/2018 0945 Last data filed at 07/11/2018 0600 Gross per 24 hour  Intake 448.66 ml  Output 2630 ml  Net -2181.34 ml   Filed Weights   07/05/18 1900 07/06/18 0139  Weight: 114.8 kg 79.1 kg    Examination: General: elderly gentleman lying in bed in NAD HEENT: MM pink/moist, no jvd, [place] O2 Neuro: Awakens to voice, speech clear, oriented but slow to answer CV: s1s2 rrr, no m/r/g PULM: even/non-labored, diminished on left, clear on R.  Chest tube on L with clear yellow fluid draining, no air leak or respiratory tidal noted. Remains on 20 cm suction.   WU:JWJXGI:soft, non-tender, bsx4 active  Extremities: warm/dry, trace LE edema  Skin: no rashes or lesions  Resolved Hospital Problem list     Assessment & Plan:   Left Upper and Lower Lobe PNA with loculated pleural effusion -Thora 1/18 with 90 ml fluid / unable to completely drain due to loculation -IR CT placed 1/21 with 650 ml drained P: Follow intermittent CXR  Wean O2 for sats >  90% Vancomycin per ID  Follow pleural cultures   MRSA Bacteremia T11-T12, T12-L1 Diskitis / Osteomyelitis Epidural Abscess  P: Per ID / Primary SVC NSGY following  Continue vancomycin  Unable to perform TEE due to cervical hardware ID recommending re-imaging around 6 weeks with prolonged IV abx   Best practice:  Diet: heart healthy  Pain/Anxiety/Delirium protocol (if indicated): N/A VAP protocol (if indicated): N/A DVT prophylaxis: Eliquis GI  prophylaxis: N/A Glucose control: monitor Mobility: as toleraged Code Status: FULL Family Communication: No family at bedside am 1/22 Disposition: TRH.  PCCM will continue to follow.   Labs   CBC: Recent Labs  Lab 07/05/18 1918 07/06/18 0218 07/06/18 0737 07/07/18 0835 07/09/18 1725 07/10/18 0652  WBC 18.4* 15.2*  --  14.2* 8.0 11.7*  NEUTROABS 15.8*  --   --  11.5* 5.9  --   HGB 7.2* 7.7* 9.3* 9.3* 9.7* 9.2*  HCT 23.3* 24.6* 29.1* 29.9* 33.4* 30.3*  MCV 87.6 87.2  --  85.4 89.5 87.8  PLT 776* 666*  --  641* 202 559*    Basic Metabolic Panel: Recent Labs  Lab 07/06/18 0218 07/07/18 0835 07/08/18 0301 07/09/18 1725 07/10/18 0652  NA 139 137 139 136 135  K 3.0* 3.7 3.8 3.9 3.5  CL 105 106 106 107 106  CO2 24 22 25 23 24   GLUCOSE 99 96 84 94 101*  BUN 18 10 7* 7* 6*  CREATININE 0.83 0.65 0.70 0.67 0.70  CALCIUM 8.6* 8.4* 8.4* 8.0* 8.1*  MG  --  1.7  --   --   --    GFR: Estimated Creatinine Clearance: 88.4 mL/min (by C-G formula based on SCr of 0.7 mg/dL). Recent Labs  Lab 07/05/18 2059 07/06/18 0218 07/07/18 0835 07/09/18 1725 07/10/18 0652  WBC  --  15.2* 14.2* 8.0 11.7*  LATICACIDVEN 0.77  --   --   --   --     Liver Function Tests: Recent Labs  Lab 07/05/18 1918 07/07/18 0835 07/09/18 1725  AST 26 19 17   ALT 11 9 8   ALKPHOS 96 97 96  BILITOT 0.7 0.4 0.5  PROT 6.3* 5.5* 5.8*  ALBUMIN 1.8* 1.4* 1.5*   No results for input(s): LIPASE, AMYLASE in the last 168 hours. No results for input(s): AMMONIA in the last 168 hours.  ABG    Component Value Date/Time   PHART 7.429 04/29/2012 0453   PCO2ART 33.0 (L) 04/29/2012 0453   PO2ART 138.0 (H) 04/29/2012 0453   HCO3 21.5 04/29/2012 0453   TCO2 22.5 04/29/2012 0453   ACIDBASEDEF 2.2 (H) 04/29/2012 0453   O2SAT 99.3 04/29/2012 0453     Coagulation Profile: Recent Labs  Lab 07/05/18 1918  INR 1.89    Cardiac Enzymes: No results for input(s): CKTOTAL, CKMB, CKMBINDEX, TROPONINI in the last  168 hours.  HbA1C: No results found for: HGBA1C  CBG: No results for input(s): GLUCAP in the last 168 hours.   Canary Brim, NP-C [facility] Pgr: [phone] or if no answer [phone] 07/11/2018, 9:45 AM  Attending Note:  68 year old male with MRSA bacteremia and empyema who presents to PCCM after IR placed a pigtail catheter and continues to drain.  No events overnight and no new complaints.  On exam, coarse BS diffusely.  I reviewed CXR myself, CT is in a good position.  Discussed with PCCM-NP.  Empyema:  - Continue drainage  - F/u on cultures  HCAP:  - Continue vanc  Epidural abscess:  -  Vancomycin  - IR consulted  PCCM will continue to follow  Patient seen and examined, agree with above note.  I dictated the care and orders written for this patient under my direction.  Alyson Reedy, MD [phone]

## 2018-07-12 ENCOUNTER — Inpatient Hospital Stay (HOSPITAL_COMMUNITY): Payer: Medicare Other

## 2018-07-12 DIAGNOSIS — J9601 Acute respiratory failure with hypoxia: Secondary | ICD-10-CM

## 2018-07-12 DIAGNOSIS — J9811 Atelectasis: Secondary | ICD-10-CM

## 2018-07-12 LAB — BASIC METABOLIC PANEL
Anion gap: 5 (ref 5–15)
BUN: 6 mg/dL — ABNORMAL LOW (ref 8–23)
CO2: 24 mmol/L (ref 22–32)
Calcium: 7.9 mg/dL — ABNORMAL LOW (ref 8.9–10.3)
Chloride: 104 mmol/L (ref 98–111)
Creatinine, Ser: 0.61 mg/dL (ref 0.61–1.24)
GFR calc Af Amer: >60 mL/min (ref >60)
GFR calc non Af Amer: >60 mL/min (ref >60)
Glucose, Bld: 81 mg/dL (ref 70–99)
Potassium: 3.6 mmol/L (ref 3.5–5.1)
SODIUM: 133 mmol/L — AB (ref 135–145)

## 2018-07-12 LAB — CBC
HCT: 28.3 % — ABNORMAL LOW (ref 39.0–52.0)
Hemoglobin: 8.7 g/dL — ABNORMAL LOW (ref 13.0–17.0)
MCH: 26.4 pg (ref 26.0–34.0)
MCHC: 30.7 g/dL (ref 30.0–36.0)
MCV: 86 fL (ref 80.0–100.0)
Platelets: 496 10*3/uL — ABNORMAL HIGH (ref 150–400)
RBC: 3.29 MIL/uL — ABNORMAL LOW (ref 4.22–5.81)
RDW: 16.2 % — ABNORMAL HIGH (ref 11.5–15.5)
WBC: 12.4 10*3/uL — ABNORMAL HIGH (ref 4.0–10.5)
nRBC: 0 % (ref 0.0–0.2)

## 2018-07-12 MED ORDER — SODIUM CHLORIDE (PF) 0.9 % IJ SOLN
10.0000 mg | Freq: Once | INTRAMUSCULAR | Status: AC
  Administered 2018-07-12: 10 mg via INTRAPLEURAL
  Filled 2018-07-12: qty 10

## 2018-07-12 MED ORDER — METOPROLOL TARTRATE 5 MG/5ML IV SOLN
INTRAVENOUS | Status: AC
  Administered 2018-07-12: 5 mg
  Filled 2018-07-12: qty 5

## 2018-07-12 MED ORDER — ENOXAPARIN SODIUM 80 MG/0.8ML ~~LOC~~ SOLN
80.0000 mg | Freq: Two times a day (BID) | SUBCUTANEOUS | Status: DC
  Administered 2018-07-13 – 2018-07-20 (×15): 80 mg via SUBCUTANEOUS
  Filled 2018-07-12 (×15): qty 0.8

## 2018-07-12 MED ORDER — METOPROLOL TARTRATE 5 MG/5ML IV SOLN: 5.0000 mg | Freq: Three times a day (TID) | INTRAVENOUS | Status: DC

## 2018-07-12 MED ORDER — STERILE WATER FOR INJECTION IJ SOLN
5.0000 mg | Freq: Two times a day (BID) | RESPIRATORY_TRACT | Status: AC
  Administered 2018-07-12: 5 mg via INTRAPLEURAL
  Filled 2018-07-12 (×6): qty 5

## 2018-07-12 MED ORDER — SODIUM CHLORIDE 0.9 % IV SOLN
INTRAVENOUS | Status: DC
  Administered 2018-07-12 – 2018-07-16 (×9): via INTRAVENOUS

## 2018-07-12 MED ORDER — CARVEDILOL 6.25 MG PO TABS
6.2500 mg | ORAL_TABLET | Freq: Two times a day (BID) | ORAL | Status: DC
  Administered 2018-07-12 – 2018-07-25 (×26): 6.25 mg via ORAL
  Filled 2018-07-12 (×27): qty 1

## 2018-07-12 NOTE — Progress Notes (Addendum)
Patient noted to be tachycardic rate 130s. Patient sats 77% on Room air, BP stable. Patient titrated to 6L O2 via high flow nasal cannula. MD notified, new orders received.   1145 - patient oxygen saturation 80s, MD Irene Limbo at bedside. Titration up of HFNC not effective, switched to NRB. RR notified. CT appears to be flowing with no imepedence post flush from NP, unable to hear breath sounds clearly on Left side. Patient states he is not in distress.

## 2018-07-12 NOTE — Progress Notes (Signed)
CSW spoke with Tresa Endo from [facility]. [facility] is unable to take the patient due to patient needing to be in a private room. The patient is positive for MRSA and the facility cannot accommodate the patient at this time. There are no private rooms available and [facility] does not have any private rooms opening anytime in the foreseeable future.   CSW will speak with the family. A new facility will have to be determined for the patient.   CSW will continue to follow.   Drucilla Schmidt, MSW, LCSW-A Clinical Social Worker [employer]

## 2018-07-12 NOTE — Progress Notes (Signed)
Chest tube site redressed.  Site cleaned with chlorhexidine. Applied 3-way stop cock to chest tube to allow for flushing.  Site flushed with clear yellow drainage returned.  Discussed with wife regarding possible instillation of tPA/pulmozyme and she is in agreement.  Will review images with Dr. Molli Knock for final decision.   Canary Brim, NP-C [facility] Pgr: [phone] or if no answer [phone] 07/12/2018, 11:25 AM

## 2018-07-12 NOTE — Progress Notes (Signed)
Patient ID: Wayne Buckley, male   DOB: Sep 21, 1950, 68 y.o.   MRN: [id number] Mr. App continues antibiotic treatment.  No increase in degree of severity of back pain lower extremity function remains stable with 4 out of 5 strength in iliopsoas quads tibialis anterior and gastrocs though his cooperation tends to vary from time to time.  Examined his back and note that there is mild tenderness to palpation and percussion at the thoracolumbar junction.  Reviewing the MRI he does not have a compressive lesion in the thoracic spine or at the thoracolumbar junction at this time we would advise conservative management with continued IV treatment.  No surgical intervention is planned.

## 2018-07-12 NOTE — Progress Notes (Signed)
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  Was subsequently found to have recurrent MRSA bacteremia with left-sided pneumonia, loculated parapneumonic effusion, thoracic and lumbar spine discitis.  He underwent CT-guided chest tube placement and is followed by pulmonology and interventional radiology.  Given abnormal MRI findings, he has been followed by neurosurgery.  A & P  Recurrent MRSA bacteremia with left-sided pneumonia with loculated left effusion/empyema, now with new acute hypoxic respiratory failure 1/24. Speech therapy felt the patient was mild aspiration risk and recommended regular diet with thin liquids on 1/21. --Chest x-ray appears worse, chest tube is had approximately 30 cc out over the last 12-24 hours, tube appears to be clogged, taking together his new oxygen requirement tachycardia presumably secondary to worsening effusion.  It is notable that the patient is on Coreg as an outpatient and therefore rebound tachycardia is possibility as well.  He is afebrile and hemodynamics are stable so I doubt sepsis.  He is not in any distress at this point.  Discussed in detail with pulmonary critical care, appreciate their care tentative plans are for Pulmozyme/TPA --Titrate oxygen for SPO2 90% or better.  Follow closely today for change in status. --Repeat chest x-ray in a.m.  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  Not a candidate for TEE in the past secondary to possible esophageal obstruction. --Repeat blood cultures no growth thus far, body fluid culture pending --Continue prolonged IV antibiotics as recommended by  infectious disease, 8-10 weeks with repeat imaging in 6-8 weeks. Tunnel PICC given involuntary movements in arms planned 1/24. --Neurosurgery recommends medical management and there are no plans for surgical intervention at this point as of the evaluation 1/24.  PMH traumatic brain injury, PMH C5-C7 spinal cord injury, chronic involuntary movements of the extremities, chronic urinary retention with chronic indwelling Foley catheter  Essential hypertension --Remained stable.  Tachycardia may be rebound or secondary to respiratory drive. --Resume carvedilol this afternoon  Iron deficiency anemia.  Continue iron supplementation.  Status post IV iron.  Status post transfusion 3 units PRBC. --stable  PMH DVT --Will continue therapeutic Lovenox with plans to change back to apixaban in the next 1-2 days  Unstageable pressure injury of the bilateral buttocks present on admission --Continue wound care   PMH TBI, cord compression.  Resident in SNF.  Chronic Foley for neurogenic bladder.  DVT prophylaxis: enoxaparin Code Status: Full Family Communication:  Disposition Plan: SNF    Brendia Sacks, MD  [facility] Direct contact: see [email or web address]  7PM-7AM contact night coverage as above 07/12/2018, 9:07 AM  LOS: 6 days   Consultants  . ID . Neurosurgery  . IR . Pulmonology  Procedures  . Diagnostic left thoracentesis . CT-guided left chest tube placement 1/20 . MRI under anesthesia . Echo Study Conclusions  - Left ventricle: The cavity size was normal. Systolic function was   normal. The estimated ejection fraction was in the range of 60%   to 65%. Wall motion was normal; there were no regional wall   motion abnormalities. Doppler parameters are consistent with   abnormal left ventricular relaxation (grade 1 diastolic   dysfunction). - Ventricular septum: Septal motion showed &quot;bounce&quot;. - Aortic valve: Transvalvular velocity was within the  normal range.    There was no stenosis. There was no regurgitation. - Mitral valve: Transvalvular velocity was within the normal range.   There was no evidence for stenosis. There was no regurgitation. - Right ventricle: The cavity size was normal. Wall thickness was   normal. Systolic function was normal. - Atrial septum: No defect or patent foramen ovale was identified   by color flow Doppler. - Tricuspid valve: There was no regurgitation.  Antibiotics  .   Interval History/Subjective  CTSP by RN this AM for HR 130s, ST on monitor, new oxygen requirement 6L, RR 30s.  Patient denies complaints, breathing ok, now chest pain.  Objective   Vitals:  Vitals:   07/12/18 0835 07/12/18 0900  BP: 106/78 95/66  Pulse: (!) 131 (!) 108  Resp: (!) 29 (!) 28  Temp:    SpO2: 96% 99%    Exam: Constitutional:   . Appears calm and quite comfortable, nontoxic Eyes:  . pupils and irises appear normal ENMT:  . grossly normal hearing  . Lips appear normal Respiratory:  . No real breath sounds on left, good air movement on right  . Respiratory effort normal. No retractions, no paradoxical breathing, no nasal flaring Cardiovascular:  . Tachycardic, regular rhythm, no m/r/g . Telemetry ST . No LE extremity edema   Musculoskeletal:  . RUE, LUE, RLE, LLE   . Moves all extremities to command, involuntary movements arms and legs noted Skin:  . No rashes, lesions, ulcers noted Neurologic:  . No apparent change; dysarthric Psychiatric:  . Mental status o Mood, affect appropriate  I have personally reviewed the following:   Today's Data  . BMP unremarkable . Hemoglobin stable at 8.7, WBC no significant change 12.4 . Chest x-ray independently reviewed to my read looks worse with increased effusion on the left and more opacification of the left hemithorax. . EKG independently reviewed shows sinus tachycardia no acute changes  Lab Data  .   Micro Data  .   Imaging   CT chest showed large  loculated left pleural effusion with upper and lower lobe consolidation.  Multiple MRIs noted  Cardiology Data  .   Other Data  .   Scheduled Meds: . carvedilol  6.25 mg Oral BID WC  . [START ON 07/13/2018] enoxaparin (LOVENOX) injection  80 mg Subcutaneous BID  . ferrous sulfate  325 mg Oral BID WC  . polyethylene glycol  17 g Oral BID  . senna  1 tablet Oral QHS  . tamsulosin  0.4 mg Oral Daily   Continuous Infusions: . lactated ringers Stopped (07/09/18 [id number])  . vancomycin Stopped (07/12/18 [id number])    Principal Problem:   MRSA bacteremia Active Problems:   Urinary retention   C5-C7 level spinal cord injury (HCC)   Essential hypertension   Chronic anemia   Traumatic brain injury (HCC)   Pleural effusion on left   Vertebral osteomyelitis (HCC)   Pressure injury of skin   Acute hypoxemic respiratory failure (HCC)   LOS: 6 days

## 2018-07-12 NOTE — Progress Notes (Signed)
Patient ID: Wayne Buckley, male   DOB: 12/21/1950, 68 y.o.   MRN: [id number] Patient well known to me for per performing a anterior cervical discectomy and fusion for myelopathy several months ago. Dr. Danielle DessElsner notify me if patient's hospitalization and we reviewed his situation and the films together. He has extensive discitis osteomyelitis with a component of thoracic epidural abscess throughout his thoracolumbar spine. There is no significant cord compression either in his thoracic spine or in his lumbar spine and patient's neurologic exam does not reflect worsening cord compression. He has some generalized weakness but has been extensively deconditioned with his prolonged hospitalizations over the last few months. I agree there is no role for surgical intervention in his thoracic or lumbar spine at this point. Will require protracted prolonged course of IV antibiotics and I agree prognosis is very poor with this and with this patient and his multiple medical comorbidities and significantly deconditioned state.he currently does move all extremities with antigravity strength I think it's also possible he could have some lower extremely weakness from lumbar plexus and femoral nerve involvement with the extensive psoas abscesses that he has.

## 2018-07-12 NOTE — Progress Notes (Signed)
PT Cancellation Note  Patient Details Name: Wayne Buckley MRN: [id number] DOB: Aug 01, 1950   Cancelled Treatment:     Discussed with RN, patient now on non-rebreather due to increasing demand. Request PT hold at this time. Will follow.  Etta Grandchild, PT, DPT Acute Rehabilitation Services Pager: [phone] Office: [phone]     Etta Grandchild 07/12/2018, 1:56 PM

## 2018-07-12 NOTE — Progress Notes (Signed)
Late entry from 1/24 1526-   Medication instillation reviewed with pharmacy- cleared to place tPA and pulmozyme simultaneously.  Left chest tube accessed, site cleaned and intra-pleural tPA and pulmozyme instilled into catheter.  Medication to dwell for one hour and then open to drain.  Reviewed process for opening with bedside RN.     Pt NT suctioned with moderate amt creamy secretions cleared.  Encouraged coughing / dee p breathing.    Canary BrimBrandi Ollis, NP-C [facility]

## 2018-07-12 NOTE — Progress Notes (Addendum)
NAME:  Wayne Buckley, MRN:  [id number], DOB:  [birth year], LOS: 6 ADMISSION DATE:  07/05/2018, CONSULTATION DATE:  1/19 REFERRING MD:  Dr. Lynden Oxford [facility]), CHIEF COMPLAINT:  Pleural Effusion   Brief History   68 yo M, admitted 1/17, recent admission for MRSA bacteremia & UTI in setting of chronic foley sent to [facility] ED from rehab facility for acute on chronic anemia requiring transfusion.  ECHO during prior admit showed no evidence of vegetation (unable to perform TEE). He was discharged to SNF on 1/13.    Found to have left sided pneumonia with parapneumonic effusion. Blood cultures on admission grew MRSA bacteremia. Underwent US guided thoracentesis 1/18 that yielded 90 cc of fluid. Unable to aspiration additional fluid due to loculations. CT Chest with large loculated effusion and evidence of T11-T12 and T12-L1 diskitis / osteomyelitis.   Past Medical History  HTN, chronic anemia requiring recurrent transfusion (no clinical bleeding), history of TBI with neurogenic bladder, chronic indwelling foley, and DVT  Significant Hospital Events   1/17 Re admit from SNF (recent discharge for MRSA bacteremia)   Consults:  PCCM  ID IR  Procedures:  1/18 US guided thoracentesis >> LDH 146, Protein 3.9, WBC 1,755 1/21 Left pigtail chest tube drain placement per IR  Significant Diagnostic Tests:  1/18 CTA Chest >> negative for PE. 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 Posterior subdural fluid collection extending from T6 through T10 compatible with abscess. 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  Discitis and osteomyelitis at  T10-11, T11-T12, T12-L1, L1-2,L4-5 . Per Neurosurgery, does not need surgery now.  Loculated left effusion, concern for infection.     Micro Data:  1/17 Blood cx >> MRSA 1/17 Urine cx >> Negative 1/18 Thoracentesis fluid >> negative  1/18 Blood Culture >> negative  Pleural fluid 1/22 >>   Antimicrobials:  1/17 Cefepime x 1 1/17 Metronidazole x 1 1/17 Vancomycin >>   Interim history/subjective:  Denies complaints, states he doesn't want to be bothered in regards to his chest tube. RN reports new tachycardia, 6L O2 requirement > found with sats in 70's.   Objective   Blood pressure 133/83, pulse (!) 135, temperature 99.4 F (37.4 C), temperature source Oral, resp. rate 18, height 5\' 9"  (1.753 m), weight 79.1 kg, SpO2 92 %.        Intake/Output Summary (Last 24 hours) at 07/12/2018 0851 Last data filed at 07/12/2018 1102 Gross per 24 hour  Intake 245.04 ml  Output 650 ml  Net -404.96 ml   Filed Weights   07/05/18 1900 07/06/18 0139  Weight: 114.8 kg 79.1 kg    Examination: General: chronically ill appearing male lying in bed in NAD HEENT: MM pink/moist, [place] O2  Neuro: Awake, speech clear, generalized weakness  CV: s1s2 rrr, no m/r/g PULM: even/non-labored, coarse no right, diminished on left  TR:ZNBV, non-tender, bsx4 active  Extremities: warm/dry, trace BLE edema, decreased muscle mass/wasting Skin: no rashes or lesions  Resolved Hospital Problem list     Assessment & Plan:   Left Upper and Lower Lobe PNA with loculated pleural effusion -Thora 1/18 with 90 ml fluid / unable to completely drain due to loculation -IR CT placed 1/21 with 650 ml drained P: Plan for tPA, pulmozyme this afternoon  Follow intermittent CXR  CT appears to be clogged > will try to flush  Wean O2 for sats > 90% Continue abx per ID  Follow pleural cultures  MRSA Bacteremia T11-T12, T12-L1 Diskitis / Osteomyelitis Epidural Abscess  -unable to perform TEE due to cervical hardware P: Per ID / Primary SVC NSGY following  Vanco as above  ID rec's > re-imaging around 6 weeks with prolonged IV abx  Best practice:   Diet: heart healthy  Pain/Anxiety/Delirium protocol (if indicated): N/A VAP protocol (if indicated): N/A DVT prophylaxis: Eliquis GI prophylaxis: N/A Glucose control: monitor Mobility: as toleraged Code Status: FULL Family Communication: No family at bedside 1/24  Disposition: TRH.  PCCM will continue to follow.   Labs   CBC: Recent Labs  Lab 07/05/18 1918 07/06/18 0218 07/06/18 0737 07/07/18 0835 07/09/18 1725 07/10/18 0652 07/12/18 0332  WBC 18.4* 15.2*  --  14.2* 8.0 11.7* 12.4*  NEUTROABS 15.8*  --   --  11.5* 5.9  --   --   HGB 7.2* 7.7* 9.3* 9.3* 9.7* 9.2* 8.7*  HCT 23.3* 24.6* 29.1* 29.9* 33.4* 30.3* 28.3*  MCV 87.6 87.2  --  85.4 89.5 87.8 86.0  PLT 776* 666*  --  641* 202 559* 496*    Basic Metabolic Panel: Recent Labs  Lab 07/07/18 0835 07/08/18 0301 07/09/18 1725 07/10/18 0652 07/12/18 0332  NA 137 139 136 135 133*  K 3.7 3.8 3.9 3.5 3.6  CL 106 106 107 106 104  CO2 22 25 23 24 24   GLUCOSE 96 84 94 101* 81  BUN 10 7* 7* 6* 6*  CREATININE 0.65 0.70 0.67 0.70 0.61  CALCIUM 8.4* 8.4* 8.0* 8.1* 7.9*  MG 1.7  --   --   --   --    GFR: Estimated Creatinine Clearance: 88.4 mL/min (by C-G formula based on SCr of 0.61 mg/dL). Recent Labs  Lab 07/05/18 2059  07/07/18 0835 07/09/18 1725 07/10/18 0652 07/12/18 0332  WBC  --    < > 14.2* 8.0 11.7* 12.4*  LATICACIDVEN 0.77  --   --   --   --   --    < > = values in this interval not displayed.    Liver Function Tests: Recent Labs  Lab 07/05/18 1918 07/07/18 0835 07/09/18 1725  AST 26 19 17   ALT 11 9 8   ALKPHOS 96 97 96  BILITOT 0.7 0.4 0.5  PROT 6.3* 5.5* 5.8*  ALBUMIN 1.8* 1.4* 1.5*   No results for input(s): LIPASE, AMYLASE in the last 168 hours. No results for input(s): AMMONIA in the last 168 hours.  ABG    Component Value Date/Time   PHART 7.429 04/29/2012 0453   PCO2ART 33.0 (L) 04/29/2012 0453   PO2ART 138.0 (H) 04/29/2012 0453   HCO3 21.5 04/29/2012 0453   TCO2 22.5  04/29/2012 0453   ACIDBASEDEF 2.2 (H) 04/29/2012 0453   O2SAT 99.3 04/29/2012 0453     Coagulation Profile: Recent Labs  Lab 07/05/18 1918 07/11/18 1113  INR 1.89 1.34    Cardiac Enzymes: No results for input(s): CKTOTAL, CKMB, CKMBINDEX, TROPONINI in the last 168 hours.  HbA1C: No results found for: HGBA1C  CBG: No results for input(s): GLUCAP in the last 168 hours.   Canary Brim, NP-C [facility] Pgr: [phone] or if no answer [phone] 07/12/2018, 8:51 AM  Attending Note:  68 year old male with diffuse MRSA infection presenting to PCCM with empyema.  Patient had a drain placed and seems to have  done well til this AM where he spiked low grade fever and CT output seems to be low.  No complaints otherwise.  On exam, decreased BS on the left.  I reviewed CXR myself, CT in place with limited output and with loculation persisting.  Discussed with PCCM-NP.  Pleural effusion:  - Change the CT stopcock to 3 way  - Instill tPA and DNase today, will need to obtain material from pharmacy then comeback and instil  - F/u with suction  - CXR in AM  - Unsure if patient is a candidate for VATS, will discuss with CVTS  MRSA:  - Vanc IV as ordered  Hypoxemia:  - Titrate O2 for sat of 88-92%  Atelectasis:   - IS and flutter valve  - Ambulate as able  PCCM will continue to follow.  Patient seen and examined, agree with above note.  I dictated the care and orders written for this patient under my direction.  Alyson ReedyYacoub, Wesam G, MD [phone]

## 2018-07-12 NOTE — Progress Notes (Signed)
Received phone call from patient's wife. Has choose [facility] [place]. Awaiting to see if they have a bed offer.   CSW will continue to follow.   Drucilla Schmidt, MSW, LCSW-A Clinical Social Worker [employer]

## 2018-07-13 ENCOUNTER — Inpatient Hospital Stay (HOSPITAL_COMMUNITY): Payer: Medicare Other

## 2018-07-13 LAB — BASIC METABOLIC PANEL
Anion gap: 11 (ref 5–15)
BUN: 7 mg/dL — ABNORMAL LOW (ref 8–23)
CO2: 23 mmol/L (ref 22–32)
Calcium: 8.1 mg/dL — ABNORMAL LOW (ref 8.9–10.3)
Chloride: 100 mmol/L (ref 98–111)
Creatinine, Ser: 0.74 mg/dL (ref 0.61–1.24)
GFR calc Af Amer: >60 mL/min (ref >60)
GFR calc non Af Amer: >60 mL/min (ref >60)
GLUCOSE: 80 mg/dL (ref 70–99)
Potassium: 3.7 mmol/L (ref 3.5–5.1)
Sodium: 134 mmol/L — ABNORMAL LOW (ref 135–145)

## 2018-07-13 LAB — CBC
HEMATOCRIT: 28.8 % — AB (ref 39.0–52.0)
HEMOGLOBIN: 8.9 g/dL — AB (ref 13.0–17.0)
MCH: 26.9 pg (ref 26.0–34.0)
MCHC: 30.9 g/dL (ref 30.0–36.0)
MCV: 87 fL (ref 80.0–100.0)
Platelets: 525 10*3/uL — ABNORMAL HIGH (ref 150–400)
RBC: 3.31 MIL/uL — ABNORMAL LOW (ref 4.22–5.81)
RDW: 16.1 % — ABNORMAL HIGH (ref 11.5–15.5)
WBC: 15.9 10*3/uL — ABNORMAL HIGH (ref 4.0–10.5)
nRBC: 0 % (ref 0.0–0.2)

## 2018-07-13 LAB — BODY FLUID CULTURE: Culture: NO GROWTH

## 2018-07-13 MED ORDER — SODIUM CHLORIDE 0.9 % IV BOLUS
500.0000 mL | Freq: Once | INTRAVENOUS | Status: AC
  Administered 2018-07-13: 500 mL via INTRAVENOUS

## 2018-07-13 NOTE — Progress Notes (Signed)
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  Was subsequently found to have recurrent MRSA bacteremia with left-sided pneumonia, loculated parapneumonic effusion, thoracic and lumbar spine discitis.  He underwent CT-guided chest tube placement and is followed by pulmonology and interventional radiology.  Given abnormal MRI findings, he has been followed by neurosurgery.  A & P  Recurrent MRSA bacteremia with left-sided pneumonia with loculated left effusion/empyema, acute hypoxic respiratory failure 1/24.  --Much improved clinically and radiographically now status post lytics which were applied yesterday by pulmonary critical care. --Continue chest tube management per pulmonology. --Wean oxygen as tolerated.  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  Not a candidate for TEE in the past secondary to possible esophageal obstruction. --Repeat blood cultures no growth, final --Continue prolonged IV antibiotics as recommended by infectious disease, 8-10 weeks with repeat imaging in 6-8 weeks. Tunnel PICC given involuntary movements in arms planned 1/27) was canceled 1/24 given patient instability). --Neurosurgery recommends medical management and there are no plans for surgical intervention at this point as of the evaluation 1/24.  PMH traumatic brain injury, PMH C5-C7 spinal cord injury, chronic involuntary movements of the extremities, chronic urinary retention with chronic indwelling Foley catheter  Essential hypertension --Remains stable.  Minimal tachycardia now. --Continue carvedilol.  Iron deficiency anemia.  Continue iron  supplementation.  Status post IV iron.  Status post transfusion 3 units PRBC. --Remains stable.  PMH DVT --Will continue therapeutic Lovenox until after placement of tunneled catheter.  Then resume apixaban.  Unstageable pressure injury of the bilateral buttocks present on admission --Continue wound care   PMH TBI, cord compression.  Resident in SNF.  Chronic Foley for neurogenic bladder.  DVT prophylaxis: enoxaparin Code Status: Full Family Communication: Discussed in detail with wife yesterday. Disposition Plan: SNF    Brendia Sacks, MD  [facility] Direct contact: see [email or web address]  7PM-7AM contact night coverage as above 07/13/2018, 2:11 PM  LOS: 7 days   Consultants  . ID . Neurosurgery  . IR . Pulmonology  Procedures  . Diagnostic left thoracentesis . CT-guided left chest tube placement 1/20 . MRI under anesthesia . Echo Study Conclusions  - Left ventricle: The cavity size was normal. Systolic function was   normal. The estimated ejection fraction was in the range of 60%   to 65%. Wall motion was normal; there were no regional wall   motion abnormalities. Doppler parameters are consistent with   abnormal left ventricular relaxation (grade 1 diastolic   dysfunction). - Ventricular septum: Septal motion showed &quot;bounce&quot;. - Aortic valve: Transvalvular velocity was within the normal range.   There was no stenosis. There was no regurgitation. - Mitral valve: Transvalvular velocity was within the normal range.   There was no evidence for stenosis. There was no regurgitation. - Right ventricle: The cavity size was normal. Wall thickness was   normal. Systolic function was normal. - Atrial septum: No defect or patent foramen ovale was identified   by color flow Doppler. - Tricuspid valve: There was no regurgitation.  Antibiotics  .   Interval History/Subjective  Per RN much better today.  Heart rate slower, respiratory status better.  Chest  tube output improved status post lytics.  Objective  Vitals:  Vitals:   07/13/18 1150 07/13/18 1158  BP: 100/65   Pulse: 89 91  Resp: (!) 30 (!) 25  Temp: 98.8 F (37.1 C)   SpO2: 99% 95%    Exam: Constitutional:   . Appears calm and comfortable Eyes:  . pupils and irises appear normal . Normal lids  ENMT:  . grossly normal hearing  . Lips appear normal Respiratory:  . CTA bilaterally, no w/r/r.  . Respiratory effort normal. Cardiovascular:  . RRR, no m/r/g . No LE extremity edema   Abdomen:  . Soft Musculoskeletal:  . RUE, LUE, RLE, LLE   . Involuntary movements of the upper extremities noted.  Able to move both lower extremities to command. Neurologic:  . No obvious change Psychiatric:  . Mental status o Mood, affect without change  I have personally reviewed the following:   Today's Data  . BMP unremarkable. . WBC slightly elevated compared to yesterday 15.9.  Hemoglobin stable 8.9.  Platelets 525. Marland Kitchen Chest x-ray independently reviewed shows substantial improvement left pleural effusion compared to yesterday.  Lab Data  .   Micro Data  .   Imaging   CT chest showed large loculated left pleural effusion with upper and lower lobe consolidation.  Multiple MRIs noted  Cardiology Data  .   Other Data  .   Scheduled Meds: . carvedilol  6.25 mg Oral BID WC  . pulmozyme (DORNASE) for intrapleural administration  5 mg Intrapleural Q12H  . enoxaparin (LOVENOX) injection  80 mg Subcutaneous BID  . ferrous sulfate  325 mg Oral BID WC  . polyethylene glycol  17 g Oral BID  . senna  1 tablet Oral QHS  . tamsulosin  0.4 mg Oral Daily   Continuous Infusions: . sodium chloride 75 mL/hr at 07/13/18 0848  . lactated ringers Stopped (07/09/18 5056)  . vancomycin Stopped (07/13/18 9794)    Principal Problem:   MRSA bacteremia Active Problems:   Urinary retention   C5-C7 level spinal cord injury (HCC)   Essential hypertension   Chronic anemia    Traumatic brain injury (HCC)   Pleural effusion on left   Vertebral osteomyelitis (HCC)   Pressure injury of skin   Acute hypoxemic respiratory failure (HCC)   Atelectasis   Empyema (HCC)   LOS: 7 days

## 2018-07-13 NOTE — Progress Notes (Signed)
Physical Therapy Treatment Patient Details Name: Wayne Buckley MRN: [id number] DOB: 11/22/1950 Today's Date: 07/13/2018    History of Present Illness Pt admitted 07/05/18 with symptomatic anemia and MRSA bacteremia. 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     PT Comments    Patient more agreeable to therapy today, came to sitting up in EOB with max A of 2, working on balance and therex. Will cont to follow, rec SNF.    Follow Up Recommendations  SNF     Equipment Recommendations       Recommendations for Other Services Rehab consult     Precautions / Restrictions Precautions Precautions: Fall Restrictions Weight Bearing Restrictions: No    Mobility  Bed Mobility Overal bed mobility: Needs Assistance Bed Mobility: Rolling;Supine to Sit;Sit to Supine     Supine to sit: Max assist;+2 for physical assistance;HOB elevated Sit to supine: Max assist;+2 for physical assistance;HOB elevated   General bed mobility comments: max A of 2 to come to sitting once patient EOB able to support himself with contat guard   Transfers                    Ambulation/Gait                 Stairs             Wheelchair Mobility    Modified Rankin (Stroke Patients Only)       Balance Overall balance assessment: Needs assistance Sitting-balance support: Bilateral upper extremity supported;Feet supported Sitting balance-[name] Scale: Zero                                      Cognition Arousal/Alertness: Awake/alert Behavior During Therapy: Restless;Agitated;Flat affect Overall Cognitive Status: History of cognitive impairments - at baseline                                        Exercises      General Comments        Pertinent Vitals/Pain Pain Assessment: 0-10 Faces Pain Scale: Hurts even more Pain Location: "all over", unable to specify  Pain Descriptors /  Indicators: Grimacing;Moaning Pain Intervention(s): Monitored during session;Limited activity within patient's tolerance    Home Living                      Prior Function            PT Goals (current goals can now be found in the care plan section) Acute Rehab PT Goals Patient Stated Goal: to get stronger PT Goal Formulation: With patient Time For Goal Achievement: 07/24/18 Potential to Achieve Goals: Fair Progress towards PT goals: Progressing toward goals    Frequency    Min 3X/week      PT Plan Current plan remains appropriate    Co-evaluation              AM-PAC PT "6 Clicks" Mobility   Outcome Measure  Help needed turning from your back to your side while in a flat bed without using bedrails?: Total Help needed moving from lying on your back to sitting on the side of a flat bed without using bedrails?: Total Help needed moving to and from a bed to a chair (including  a wheelchair)?: Total Help needed standing up from a chair using your arms (e.g., wheelchair or bedside chair)?: Total Help needed to walk in hospital room?: Total Help needed climbing 3-5 steps with a railing? : Total 6 Click Score: 6    End of Session Equipment Utilized During Treatment: Gait belt Activity Tolerance: Patient limited by pain Patient left: in bed;with call bell/phone within reach;with bed alarm set;with family/visitor present Nurse Communication: Mobility status PT Visit Diagnosis: Muscle weakness (generalized) (M62.81);Unsteadiness on feet (R26.81);Other abnormalities of gait and mobility (R26.89);Other symptoms and signs involving the nervous system (R29.898)     Time: 1100-1120 PT Time Calculation (min) (ACUTE ONLY): 20 min  Charges:  $Therapeutic Activity: 8-22 mins                     Etta GrandchildSean Carle Dargan, PT, DPT Acute Rehabilitation Services Pager: [phone] Office: [phone]     Etta GrandchildSean Walton Digilio 07/13/2018, 12:58 PM

## 2018-07-13 NOTE — Progress Notes (Signed)
NAME:  Wayne Buckley, MRN:  [id number], DOB:  Nov 07, 1950, LOS: 7 ADMISSION DATE:  07/05/2018, CONSULTATION DATE:  1/19 REFERRING MD:  Dr. Lynden Oxford [facility]), CHIEF COMPLAINT:  Pleural Effusion   Brief History   68 yo M, admitted 1/17, recent admission for MRSA bacteremia & UTI in setting of chronic foley sent to [facility] ED from rehab facility for acute on chronic anemia requiring transfusion.  ECHO during prior admit showed no evidence of vegetation (unable to perform TEE). He was discharged to SNF on 1/13.    Found to have left sided pneumonia with parapneumonic effusion. Blood cultures on admission grew MRSA bacteremia. Underwent US guided thoracentesis 1/18 that yielded 90 cc of fluid. Unable to aspiration additional fluid due to loculations. CT Chest with large loculated effusion and evidence of T11-T12 and T12-L1 diskitis / osteomyelitis.   Past Medical History  HTN, chronic anemia requiring recurrent transfusion (no clinical bleeding), history of TBI with neurogenic bladder, chronic indwelling foley, and DVT  Significant Hospital Events   1/17 Re admit from SNF (recent discharge for MRSA bacteremia)   Consults:  PCCM  ID IR  Procedures:  1/18 US guided thoracentesis >> LDH 146, Protein 3.9, WBC 1,755 1/21 Left pigtail chest tube drain placement per IR  Significant Diagnostic Tests:  1/18 CTA Chest >> negative for PE. 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 Posterior subdural fluid collection extending from T6 through T10 compatible with abscess. 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  Discitis and osteomyelitis at  T10-11, T11-T12, T12-L1, L1-2,L4-5 . Per Neurosurgery, does not need surgery now.  Loculated left effusion, concern for infection.     Micro Data:  1/17 Blood cx >> MRSA 1/17 Urine cx >> Negative 1/18 Thoracentesis fluid >> negative  1/18 Blood Culture >> negative  Pleural fluid 1/22 >>   Antimicrobials:  1/17 Cefepime x 1 1/17 Metronidazole x 1 1/17 Vancomycin >>   Interim history/subjective:  Comfortable respiratory pattern Chest tube 940 cc out 24 hours, another 200 cc out since 7 this morning  Objective   Blood pressure 100/65, pulse 91, temperature 98.8 F (37.1 C), temperature source Axillary, resp. rate (!) 25, height 5\' 9"  (1.753 m), weight 79.1 kg, SpO2 95 %.        Intake/Output Summary (Last 24 hours) at 07/13/2018 1631 Last data filed at 07/13/2018 1150 Gross per 24 hour  Intake 1877.02 ml  Output 1385 ml  Net 492.02 ml   Filed Weights   07/05/18 1900 07/06/18 0139  Weight: 114.8 kg 79.1 kg    Examination: General: Chronically ill, thin man, no distress HEENT: Oropharynx clear, poor dentition Neuro: Wakes easily, interacts appropriately generalized weakness but no focal deficits CV: Geter, no murmur PULM: Decreased left-sided breath sounds.  The chest tube appears to have free-flowing fluid, yellow and not turbid.  Remains on -20 suction GI: Soft, benign Extremities: Trace lower extremity edema Skin: No rash  Resolved Hospital Problem list     Assessment & Plan:   Left Upper and Lower Lobe PNA with loculated pleural effusion -Thora 1/18 with 90 ml fluid / unable to completely drain due to loculation -IR CT placed 1/21 with 650 ml drained P: Appeared to respond well to TPA/DNase given 1/24 x 2.  I think we can  defer repeat treatment today and follow output.  If it begins to decline then we will need to decide based on chest x-ray whether to re-instill lytics versus repeat a CT scan of the chest and assess how much fluid remains Continue to follow fluid cultures Continue antibiotics as ordered May still need to discuss with TCTS although not clear to me that he is a good candidate  for VATS given his overall deconditioning   MRSA Bacteremia T11-T12, T12-L1 Diskitis / Osteomyelitis Epidural Abscess  -unable to perform TEE due to cervical hardware P: Appreciate ID, neurosurgery input Vancomycin as ordered He will need spine reimaging after approximately 6 weeks therapy  Best practice:  Diet: heart healthy  Pain/Anxiety/Delirium protocol (if indicated): N/A VAP protocol (if indicated): N/A DVT prophylaxis: Eliquis GI prophylaxis: N/A Glucose control: monitor Mobility: as toleraged Code Status: FULL Family Communication: No family at bedside 1/24 or 1/25  Disposition: TRH.  PCCM will continue to follow.   Labs   CBC: Recent Labs  Lab 07/07/18 0835 07/09/18 1725 07/10/18 0652 07/12/18 0332 07/13/18 0219  WBC 14.2* 8.0 11.7* 12.4* 15.9*  NEUTROABS 11.5* 5.9  --   --   --   HGB 9.3* 9.7* 9.2* 8.7* 8.9*  HCT 29.9* 33.4* 30.3* 28.3* 28.8*  MCV 85.4 89.5 87.8 86.0 87.0  PLT 641* 202 559* 496* 525*    Basic Metabolic Panel: Recent Labs  Lab 07/07/18 0835 07/08/18 0301 07/09/18 1725 07/10/18 0652 07/12/18 0332 07/13/18 0219  NA 137 139 136 135 133* 134*  K 3.7 3.8 3.9 3.5 3.6 3.7  CL 106 106 107 106 104 100  CO2 22 25 23 24 24 23   GLUCOSE 96 84 94 101* 81 80  BUN 10 7* 7* 6* 6* 7*  CREATININE 0.65 0.70 0.67 0.70 0.61 0.74  CALCIUM 8.4* 8.4* 8.0* 8.1* 7.9* 8.1*  MG 1.7  --   --   --   --   --    GFR: Estimated Creatinine Clearance: 88.4 mL/min (by C-G formula based on SCr of 0.74 mg/dL). Recent Labs  Lab 07/09/18 1725 07/10/18 0652 07/12/18 0332 07/13/18 0219  WBC 8.0 11.7* 12.4* 15.9*    Liver Function Tests: Recent Labs  Lab 07/07/18 0835 07/09/18 1725  AST 19 17  ALT 9 8  ALKPHOS 97 96  BILITOT 0.4 0.5  PROT 5.5* 5.8*  ALBUMIN 1.4* 1.5*   No results for input(s): LIPASE, AMYLASE in the last 168 hours. No results for input(s): AMMONIA in the last 168 hours.  ABG    Component Value Date/Time   PHART 7.429 04/29/2012  0453   PCO2ART 33.0 (L) 04/29/2012 0453   PO2ART 138.0 (H) 04/29/2012 0453   HCO3 21.5 04/29/2012 0453   TCO2 22.5 04/29/2012 0453   ACIDBASEDEF 2.2 (H) 04/29/2012 0453   O2SAT 99.3 04/29/2012 0453     Coagulation Profile: Recent Labs  Lab 07/11/18 1113  INR 1.34    Cardiac Enzymes: No results for input(s): CKTOTAL, CKMB, CKMBINDEX, TROPONINI in the last 168 hours.  HbA1C: No results found for: HGBA1C  CBG: No results for input(s): GLUCAP in the last 168 hours.   Levy Pupaobert Timisha Mondry, MD, PhD 07/13/2018, 4:36 PM [facility] [phone] or if no answer [phone]

## 2018-07-13 NOTE — Progress Notes (Signed)
CSW spoke with the wife. CSW sent patient off to [facility]. [facility] is not able to accommodate patient at this time.   CSW received permission to fax the patient out to remaining [facility] facilities.   Please give bed offers as they come in.   Drucilla Schmidt, MSW, LCSW-A Clinical Social Worker [facility] [employer]

## 2018-07-14 LAB — BASIC METABOLIC PANEL
ANION GAP: 10 (ref 5–15)
BUN: 8 mg/dL (ref 8–23)
CO2: 21 mmol/L — ABNORMAL LOW (ref 22–32)
Calcium: 7.6 mg/dL — ABNORMAL LOW (ref 8.9–10.3)
Chloride: 104 mmol/L (ref 98–111)
Creatinine, Ser: 0.65 mg/dL (ref 0.61–1.24)
GFR calc Af Amer: >60 mL/min (ref >60)
GFR calc non Af Amer: >60 mL/min (ref >60)
Glucose, Bld: 84 mg/dL (ref 70–99)
Potassium: 3.2 mmol/L — ABNORMAL LOW (ref 3.5–5.1)
Sodium: 135 mmol/L (ref 135–145)

## 2018-07-14 LAB — CBC
HCT: 27.1 % — ABNORMAL LOW (ref 39.0–52.0)
Hemoglobin: 8.4 g/dL — ABNORMAL LOW (ref 13.0–17.0)
MCH: 27.3 pg (ref 26.0–34.0)
MCHC: 31 g/dL (ref 30.0–36.0)
MCV: 88 fL (ref 80.0–100.0)
NRBC: 0 % (ref 0.0–0.2)
Platelets: 434 10*3/uL — ABNORMAL HIGH (ref 150–400)
RBC: 3.08 MIL/uL — ABNORMAL LOW (ref 4.22–5.81)
RDW: 16.3 % — ABNORMAL HIGH (ref 11.5–15.5)
WBC: 10.2 10*3/uL (ref 4.0–10.5)

## 2018-07-14 MED ORDER — SODIUM CHLORIDE 0.9 % IV BOLUS
500.0000 mL | Freq: Once | INTRAVENOUS | Status: AC
  Administered 2018-07-14: 500 mL via INTRAVENOUS

## 2018-07-14 MED ORDER — POTASSIUM CHLORIDE 20 MEQ PO PACK
40.0000 meq | PACK | Freq: Two times a day (BID) | ORAL | Status: AC
  Administered 2018-07-14 (×2): 40 meq via ORAL
  Filled 2018-07-14 (×2): qty 2

## 2018-07-14 NOTE — Progress Notes (Signed)
ANTICOAGULATION CONSULT NOTE - Initial Consult  Pharmacy Consult for Lovenox  Indication: DVT  No Known Allergies  Patient Measurements: Height: 5\' 9"  (175.3 cm) Weight: 174 lb 6.1 oz (79.1 kg) IBW/kg (Calculated) : 70.7  Vital Signs: Temp: 98 F (36.7 C) (01/26 0752) Temp Source: Oral (01/26 0752) BP: 107/71 (01/26 1000) Pulse Rate: 88 (01/26 1000)  Labs: Recent Labs    07/11/18 1113  07/12/18 0332 07/13/18 0219 07/14/18 0301  HGB  --    < > 8.7* 8.9* 8.4*  HCT  --   --  28.3* 28.8* 27.1*  PLT  --   --  496* 525* 434*  LABPROT 16.4*  --   --   --   --   INR 1.34  --   --   --   --   CREATININE  --   --  0.61 0.74 0.65   < > = values in this interval not displayed.    Estimated Creatinine Clearance: 88.4 mL/min (by C-G formula based on SCr of 0.65 mg/dL).   Medical History: Past Medical History:  Diagnosis Date  . BPH (benign prostatic hyperplasia)   . DVT (deep venous thrombosis) (HCC) 02/2018   extensive LLE DVT  . H/O alcohol abuse   . Hypertension   . Neurogenic bowel   . Spinal cord injury, cervical region (HCC) 02/2018  . TBI (traumatic brain injury) (HCC) 2013   SDH with left partial frontal lobectomy    Medications:  Medications Prior to Admission  Medication Sig Dispense Refill Last Dose  . acetaminophen (TYLENOL) 325 MG tablet Take 2 tablets (650 mg total) by mouth every 6 (six) hours as needed for mild pain.   Past Month at Unknown time  . amLODipine (NORVASC) 5 MG tablet Take 5 mg by mouth daily.   07/05/2018 at    . apixaban (ELIQUIS) 5 MG TABS tablet Take 1 tablet (5 mg total) by mouth 2 (two) times daily. 60 tablet  07/05/2018 at 0935  . bisacodyl (DULCOLAX) 10 MG suppository Place 1 suppository (10 mg total) rectally daily at 6 (six) AM. (Patient taking differently: Place 10 mg rectally daily as needed for mild constipation. ) 12 suppository 0 07/04/2018 at Unknown time  . carvedilol (COREG) 12.5 MG tablet Take 1 tablet (12.5 mg total) by mouth  2 (two) times daily with a meal.   07/05/2018 at 0935  . ferrous sulfate 325 (65 FE) MG tablet Take 1 tablet (325 mg total) by mouth 2 (two) times daily with a meal.  3 07/05/2018 at Unknown time  . liver oil-zinc oxide (DESITIN) 40 % ointment Apply topically 4 (four) times daily. Apply to sacrum, coccyx, buttocks. (Patient taking differently: Apply 1 application topically 4 (four) times daily. Apply to sacrum, coccyx, buttocks.) 56.7 g 0 07/05/2018 at Unknown time  . polyethylene glycol (MIRALAX / GLYCOLAX) packet Take 17 g by mouth daily as needed for mild constipation or moderate constipation. 14 each 0 07/04/2018 at Unknown time  . senna-docusate (SENOKOT-S) 8.6-50 MG tablet Take 1 tablet by mouth 2 (two) times daily. (Patient taking differently: Take 1 tablet by mouth 2 (two) times daily as needed (constipation). )   07/04/2018 at Unknown time  . sodium phosphate (FLEET) 7-19 GM/118ML ENEM Place 133 mLs (1 enema total) rectally daily as needed for severe constipation.  0 unk  . tamsulosin (FLOMAX) 0.4 MG CAPS capsule Take 1 capsule (0.4 mg total) by mouth daily. 30 capsule  07/04/2018 at Unknown time  .  vitamin C (VITAMIN C) 1000 MG tablet Take 1 tablet (1,000 mg total) by mouth 2 (two) times daily.   07/05/2018 at Unknown time  . amLODipine (NORVASC) 10 MG tablet Take 1 tablet (10 mg total) by mouth daily. (Patient not taking: Reported on 07/05/2018)   Not Taking at Unknown time  . collagenase (SANTYL) ointment Apply topically daily. Cleanse gluteal wounds with NS.  Apply Santyl to open areas.  Cover with NS moist 2x2 and foam dressings.  Peel back foam and change daily. Foam is changed every three days and PRN soilage. (Patient not taking: Reported on 07/05/2018) 15 g 0 Not Taking at Unknown time  . levofloxacin (LEVAQUIN) 750 MG tablet Take 750 mg by mouth daily. For 7 days. Start date 06/25/2018. End date 07/01/2018   07/01/2018  . polyethylene glycol (MIRALAX / GLYCOLAX) packet Take 17 g by mouth 2 (two)  times daily. 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 He is s/p drain placement and prolonged IV antibiotics.   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 He received a few doses upon admission but anticoagulation has been held since 1/19 for placement of CT guided drain. Pharmacy consulted to start Lovenox. LMWH to continue until after tunneled catheter per MD note.   CrCl ~ 88 mL/min. H/H low stable. Plt high.   Goal of Therapy:  Anti-Xa level 0.6-1 units/ml 4hrs after LMWH dose given Monitor platelets by anticoagulation protocol: Yes   Plan:  -Continue Lovenox 80 mg (1 mg/kg) SQ BID  -Monitor renal function and f/u catheter plans -Monitor for bleeding   Kiera Hussey A. Jeanella Craze, PharmD, BCPS Clinical Pharmacist [place] Pager: [phone] Please utilize Amion for appropriate phone number to reach the unit pharmacist [facility])

## 2018-07-14 NOTE — Progress Notes (Signed)
NAME:  Wayne Buckley, MRN:  [id number], DOB:  07-Feb-1951, LOS: 8 ADMISSION DATE:  07/05/2018, CONSULTATION DATE:  1/19 REFERRING MD:  Dr. Lynden [facility]), CHIEF COMPLAINT:  Pleural Effusion   Brief History   68 yo M, admitted 1/17, recent admission for MRSA bacteremia & UTI in setting of chronic foley sent to [facility] ED from rehab facility for acute on chronic anemia requiring transfusion.  ECHO during prior admit showed no evidence of vegetation (unable to perform TEE). He was discharged to SNF on 1/13.    Found to have left sided pneumonia with parapneumonic effusion. Blood cultures on admission grew MRSA bacteremia. Underwent US guided thoracentesis 1/18 that yielded 90 cc of fluid. Unable to aspiration additional fluid due to loculations. CT Chest with large loculated effusion and evidence of T11-T12 and T12-L1 diskitis / osteomyelitis.   Past Medical History  HTN, chronic anemia requiring recurrent transfusion (no clinical bleeding), history of TBI with neurogenic bladder, chronic indwelling foley, and DVT  Significant Hospital Events   1/17 Re admit from SNF (recent discharge for MRSA bacteremia)   Consults:  PCCM  ID IR  Procedures:  1/18 US guided thoracentesis >> LDH 146, Protein 3.9, WBC 1,755 1/21 Left pigtail chest tube drain placement per IR  Significant Diagnostic Tests:  1/18 CTA Chest >> negative for PE. 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 Posterior subdural fluid collection extending from T6 through T10 compatible with abscess. 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  Discitis and osteomyelitis at  T10-11, T11-T12, T12-L1, L1-2,L4-5 . Per Neurosurgery, does not need surgery now.  Loculated left effusion, concern for infection.     Micro Data:  1/17 Blood cx >> MRSA 1/17 Urine cx >> Negative 1/18 Thoracentesis fluid >> negative  1/18 Blood Culture >> negative  Pleural fluid 1/22 >>   Antimicrobials:  1/17 Cefepime x 1 1/17 Metronidazole x 1 1/17 Vancomycin >>   Interim history/subjective:  Lying comfortably in bed Chest tube drained 530 cc over last 24 hours, continues to have some yellow drainage on suction  Objective   Blood pressure 110/68, pulse 88, temperature 98 F (36.7 C), temperature source Oral, resp. rate (!) 24, height 5\' 9"  (1.753 m), weight 79.1 kg, SpO2 100 %.        Intake/Output Summary (Last 24 hours) at 07/14/2018 1418 Last data filed at 07/14/2018 0800 Gross per 24 hour  Intake 2786.92 ml  Output 925 ml  Net 1861.92 ml   Filed Weights   07/05/18 1900 07/06/18 0139  Weight: 114.8 kg 79.1 kg    Examination: General: Chronically ill thin man, no distress HEENT: Oropharynx clear, poor dentition Neuro: Awake, interacts appropriately generalized weakness but nothing focal CV: Regular, no murmur PULM: Decreased left-sided breath sounds.  Chest tube is in good position has free-flowing fluid without any cellularity.  Suction -20 cm GI: Soft, nontender with positive bowel sounds Extremities: Lower extremity edema Skin: No rash  Resolved Hospital Problem list     Assessment & Plan:   Left Upper and Lower Lobe PNA with loculated pleural effusion -Thora 1/18 with 90 ml fluid / unable to completely drain due to loculation -IR CT placed 1/21 with 650 ml drained P: Chest tube continues to drain yellow fluid since TPA/DNase was given on  1/24.  Continue to follow drainage.  As it begins to decrease I think she needs a repeat CT scan of chest to assess for any further loculations and how much fluid remains.  This will help determine whether the tube can come out, whether we should try more TPA, whether we should consult with TCTS regarding possible VATS.  I suspect that his overall  deconditioning and his underlying MRSA infection will make him a poor candidate for VATS   MRSA Bacteremia T11-T12, T12-L1 Diskitis / Osteomyelitis Epidural Abscess  -unable to perform TEE due to cervical hardware P: Appreciate ID, neurosurgery input Vancomycin as ordered He will need spine reimaging after approximately 6 weeks therapy  Best practice:  Diet: heart healthy  Pain/Anxiety/Delirium protocol (if indicated): N/A VAP protocol (if indicated): N/A DVT prophylaxis: Eliquis GI prophylaxis: N/A Glucose control: monitor Mobility: as toleraged Code Status: FULL Family Communication: No family at bedside 1/24 or 1/25  Disposition: TRH.  PCCM will continue to follow.   Labs   CBC: Recent Labs  Lab 07/09/18 1725 07/10/18 3500 07/12/18 0332 07/13/18 0219 07/14/18 0301  WBC 8.0 11.7* 12.4* 15.9* 10.2  NEUTROABS 5.9  --   --   --   --   HGB 9.7* 9.2* 8.7* 8.9* 8.4*  HCT 33.4* 30.3* 28.3* 28.8* 27.1*  MCV 89.5 87.8 86.0 87.0 88.0  PLT 202 559* 496* 525* 434*    Basic Metabolic Panel: Recent Labs  Lab 07/09/18 1725 07/10/18 0652 07/12/18 0332 07/13/18 0219 07/14/18 0301  NA 136 135 133* 134* 135  K 3.9 3.5 3.6 3.7 3.2*  CL 107 106 104 100 104  CO2 23 24 24 23  21*  GLUCOSE 94 101* 81 80 84  BUN 7* 6* 6* 7* 8  CREATININE 0.67 0.70 0.61 0.74 0.65  CALCIUM 8.0* 8.1* 7.9* 8.1* 7.6*   GFR: Estimated Creatinine Clearance: 88.4 mL/min (by C-G formula based on SCr of 0.65 mg/dL). Recent Labs  Lab 07/10/18 0652 07/12/18 0332 07/13/18 0219 07/14/18 0301  WBC 11.7* 12.4* 15.9* 10.2    Liver Function Tests: Recent Labs  Lab 07/09/18 1725  AST 17  ALT 8  ALKPHOS 96  BILITOT 0.5  PROT 5.8*  ALBUMIN 1.5*   No results for input(s): LIPASE, AMYLASE in the last 168 hours. No results for input(s): AMMONIA in the last 168 hours.  ABG    Component Value Date/Time   PHART 7.429 04/29/2012 0453   PCO2ART 33.0 (L) 04/29/2012 0453   PO2ART 138.0 (H) 04/29/2012  0453   HCO3 21.5 04/29/2012 0453   TCO2 22.5 04/29/2012 0453   ACIDBASEDEF 2.2 (H) 04/29/2012 0453   O2SAT 99.3 04/29/2012 0453     Coagulation Profile: Recent Labs  Lab 07/11/18 1113  INR 1.34    Cardiac Enzymes: No results for input(s): CKTOTAL, CKMB, CKMBINDEX, TROPONINI in the last 168 hours.  HbA1C: No results found for: HGBA1C  CBG: No results for input(s): GLUCAP in the last 168 hours.   Levy Pupa, MD, PhD 07/14/2018, 2:18 PM [facility] [phone] or if no answer [phone]

## 2018-07-14 NOTE — Progress Notes (Signed)
Pharmacy Antibiotic Note  Wayne Buckley is a 67 y.o. male admitted on 07/05/2018 with Thoracolumbar osteomyelitis, Empyema, Bacteremia - MRSA.  Pharmacy has been consulted for vancomycin dosing.  SCr 0.65  Plan: Continue Vancomycin 1000 mg IV every 24 hours. (projected new AUC 441) Monitor clinical progress, cultures/sensitivities, renal function, abx plan   Height: 5\' 9"  (175.3 cm) Weight: 174 lb 6.1 oz (79.1 kg) IBW/kg (Calculated) : 70.7  Temp (24hrs), Avg:98.4 F (36.9 C), Min:97.9 F (36.6 C), Max:98.8 F (37.1 C)  Recent Labs  Lab 07/07/18 1104  07/09/18 1725 07/10/18 0652 07/11/18 0618 07/12/18 0332 07/13/18 0219 07/14/18 0301  WBC  --   --  8.0 11.7*  --  12.4* 15.9* 10.2  CREATININE  --    < > 0.67 0.70  --  0.61 0.74 0.65  VANCOTROUGH 21*  --   --   --  16  --   --   --   VANCOPEAK  --   --   --  41*  --   --   --   --    < > = values in this interval not displayed.    Estimated Creatinine Clearance: 88.4 mL/min (by C-G formula based on SCr of 0.65 mg/dL).    No Known Allergies   Wayne Buckley A. Jeanella Craze, PharmD, BCPS Clinical Pharmacist [place] Please utilize Amion for appropriate phone number to reach the unit pharmacist [facility])   07/14/2018 10:35 AM

## 2018-07-14 NOTE — Progress Notes (Signed)
PROGRESS NOTE  Wayne Buckley WUJ:[id number] DOB: 05/20/1951 DOA: 07/05/2018 PCP: Patient, No Pcp Per  Brief History   68 year old male PMH TBI, subdural hemorrhage with left partial frontal lobectomy, cervical spinal cord injury, presented for evaluation of suspected anemia, was admitted for pneumonia.  Was subsequently found to have recurrent MRSA bacteremia with left-sided pneumonia, loculated parapneumonic effusion, thoracic and lumbar spine discitis.  He underwent CT-guided chest tube placement and is followed by pulmonology and interventional radiology.  Given abnormal MRI findings, he has been followed by neurosurgery.  A & P  Recurrent MRSA bacteremia with left-sided pneumonia with loculated left effusion/empyema, acute hypoxic respiratory failure 1/24.  Status post lytics 1/24. --Dramatically improved today.  Now on room air. --Continue chest tube and pleural effusion management as per pulmonology.  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  Not a candidate for TEE in the past secondary to possible esophageal obstruction. Repeat blood cultures no growth, final --Continue prolonged IV antibiotics as recommended by infectious disease, 8-10 weeks with repeat imaging in 6-8 weeks. Tunnel PICC given involuntary movements in arms planned 1/27 (was canceled 1/24 given patient instability). --Neurosurgery recommended medical management and there are no plans for surgical intervention at this point as of the evaluation 1/24.  PMH traumatic brain injury, PMH C5-C7 spinal cord injury, chronic involuntary movements of the extremities, chronic urinary retention with chronic indwelling Foley catheter  Essential hypertension --Remains stable.  Heart rate within normal limits. --Continue carvedilol  Iron deficiency anemia.  Continue iron supplementation.  Status post IV iron.  Status post  transfusion 3 units PRBC. --Stable  PMH DVT --Will continue therapeutic Lovenox until after placement of tunneled catheter.  Then resume apixaban.  Unstageable pressure injury of the bilateral buttocks present on admission --Continue wound care   PMH TBI, cord compression.  Resident in SNF.  Chronic Foley for neurogenic bladder.   Overall dramatically improved from a respiratory standpoint.  Relatively low urine output renal function is preserved, BUN within normal limits and Foley appears to be functioning appropriately and bladder scans unremarkable.  Probably just needs more fluid.  Will bolus and increase infusion rate.  Will downgrade to MedSurg.  DVT prophylaxis: enoxaparin Code Status: Full Family Communication: Disposition Plan: SNF    Brendia Sacksaniel Donnell Beauchamp, MD  [facility] Direct contact: see [email or web address]  7PM-7AM contact night coverage as above 07/14/2018, 1:22 PM  LOS: 8 days   Consultants  . ID . Neurosurgery  . IR . Pulmonology  Procedures  . Diagnostic left thoracentesis . CT-guided left chest tube placement 1/20 . MRI under anesthesia . Echo Study Conclusions  - Left ventricle: The cavity size was normal. Systolic function was   normal. The estimated ejection fraction was in the range of 60%   to 65%. Wall motion was normal; there were no regional wall   motion abnormalities. Doppler parameters are consistent with   abnormal left ventricular relaxation (grade 1 diastolic   dysfunction). - Ventricular septum: Septal motion showed &quot;bounce&quot;. - Aortic valve: Transvalvular velocity was within the normal range.   There was no stenosis. There was no regurgitation. - Mitral valve: Transvalvular velocity was within the normal range.   There was no evidence for stenosis. There was no regurgitation. - Right ventricle: The cavity size was normal. Wall thickness was   normal. Systolic function was normal. - Atrial septum: No defect or patent foramen  ovale was identified   by color  flow Doppler. - Tricuspid valve: There was no regurgitation.  Antibiotics  .   Interval History/Subjective  Feels fine today.  Breathing fine.  Eating okay.  No complaints.  RN reports low urine output.  Objective   Vitals:  Vitals:   07/14/18 1000 07/14/18 1234  BP: 107/71 110/68  Pulse: 88   Resp: (!) 25 (!) 24  Temp:    SpO2: 97% 100%    Exam: Constitutional:   . Appears calm and comfortable.  Appears better today. Eyes:  . pupils and irises appear normal ENMT:  . grossly normal hearing  . Tongue appears unremarkable Respiratory:  . CTA bilaterally, no w/r/r.  . Respiratory effort normal.  Cardiovascular:  . RRR, no m/r/g . No LE extremity edema  . Telemetry sinus rhythm Abdomen:  . Soft Musculoskeletal:  . RUE, LUE, RLE, LLE   . Involuntary movements of the upper and lower extremities noted . Moves both legs to command. Neurologic:  . No apparent change Psychiatric:  . Mental status o Mood, affect appropriate  I have personally reviewed the following:   Today's Data  . Urine output 545, chest tube output 530 . Potassium 3.2, remainder BMP unremarkable.  WBC now within normal limits.  Hemoglobin stable at 8.4.  Platelets trending down towards normal.  Lab Data  .   Micro Data  .   Imaging   CT chest showed large loculated left pleural effusion with upper and lower lobe consolidation.  Multiple MRIs noted  Cardiology Data  .   Other Data  .   Scheduled Meds: . carvedilol  6.25 mg Oral BID WC  . pulmozyme (DORNASE) for intrapleural administration  5 mg Intrapleural Q12H  . enoxaparin (LOVENOX) injection  80 mg Subcutaneous BID  . ferrous sulfate  325 mg Oral BID WC  . polyethylene glycol  17 g Oral BID  . potassium chloride  40 mEq Oral BID  . senna  1 tablet Oral QHS  . tamsulosin  0.4 mg Oral Daily   Continuous Infusions: . sodium chloride 150 mL/hr at 07/14/18 1130  . lactated ringers Stopped  (07/09/18 5573)  . vancomycin Stopped (07/14/18 2202)    Principal Problem:   MRSA bacteremia Active Problems:   Urinary retention   C5-C7 level spinal cord injury (HCC)   Essential hypertension   Chronic anemia   Traumatic brain injury (HCC)   Pleural effusion on left   Vertebral osteomyelitis (HCC)   Pressure injury of skin   Acute hypoxemic respiratory failure (HCC)   Atelectasis   Empyema (HCC)   LOS: 8 days

## 2018-07-15 LAB — CBC
HCT: 28.7 % — ABNORMAL LOW (ref 39.0–52.0)
Hemoglobin: 8.7 g/dL — ABNORMAL LOW (ref 13.0–17.0)
MCH: 26.6 pg (ref 26.0–34.0)
MCHC: 30.3 g/dL (ref 30.0–36.0)
MCV: 87.8 fL (ref 80.0–100.0)
Platelets: 463 10*3/uL — ABNORMAL HIGH (ref 150–400)
RBC: 3.27 MIL/uL — ABNORMAL LOW (ref 4.22–5.81)
RDW: 16.3 % — ABNORMAL HIGH (ref 11.5–15.5)
WBC: 9.6 10*3/uL (ref 4.0–10.5)
nRBC: 0 % (ref 0.0–0.2)

## 2018-07-15 LAB — BASIC METABOLIC PANEL
Anion gap: 9 (ref 5–15)
BUN: 7 mg/dL — ABNORMAL LOW (ref 8–23)
CHLORIDE: 105 mmol/L (ref 98–111)
CO2: 21 mmol/L — ABNORMAL LOW (ref 22–32)
Calcium: 7.7 mg/dL — ABNORMAL LOW (ref 8.9–10.3)
Creatinine, Ser: 0.63 mg/dL (ref 0.61–1.24)
GFR calc Af Amer: >60 mL/min (ref >60)
GFR calc non Af Amer: >60 mL/min (ref >60)
Glucose, Bld: 86 mg/dL (ref 70–99)
POTASSIUM: 3.6 mmol/L (ref 3.5–5.1)
Sodium: 135 mmol/L (ref 135–145)

## 2018-07-15 MED ORDER — COLLAGENASE 250 UNIT/GM EX OINT
TOPICAL_OINTMENT | Freq: Every day | CUTANEOUS | Status: DC
  Administered 2018-07-15 – 2018-07-25 (×11): via TOPICAL
  Filled 2018-07-15: qty 30

## 2018-07-15 NOTE — Progress Notes (Signed)
Physical Therapy Treatment Patient Details Name: Wayne Buckley MRN: [id number] DOB: 12/09/1950 Today's Date: 07/15/2018    History of Present Illness 68 y.o. male admitted 07/05/18 from SNF rehab with symptomatic anemia and MRSA bacteremia empyema.  Pt found to have L sided PNA with effusion requiring US guided thoracentesis (07/06/18) and pigtail chest tube placement in IR (07/09/18).  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     PT Comments    Pt is very weak and deconditioned, requiring two person max assist and standing frame to get up OOB today.  He will need the maxi move total lift to get back to bed safely after fatigue.  The wife is very helpful to encourage his mobility given his baseline cognitive deficits, so I would continue to attempt to see him while she is present. PT will continue to follow acutely for safe mobility progression  Follow Up Recommendations  SNF     Equipment Recommendations  Hospital bed;Other (comment)(hoyer lift)    Recommendations for Other Services   NA     Precautions / Restrictions Precautions Precautions: Fall Precaution Comments: TBI and old SCI, very weak in his legs    Mobility  Bed Mobility Overal bed mobility: Needs Assistance Bed Mobility: Supine to Sit     Supine to sit: Max assist;+2 for physical assistance;HOB elevated     General bed mobility comments: Two person max assist to help progress bil LEs to EOB and support trunk to come up to sitting.  Pt initially with posterior push which improved once setteled EOB.   Transfers Overall transfer level: Needs assistance   Transfers: Sit to/from Stand;Stand Pivot Transfers Sit to Stand: +2 physical assistance;Max assist;From elevated surface Stand pivot transfers: (used the steady pt forward flexed over front handle bar)       General transfer comment: Two person max  assist to stand from elevated bed and elevated steady standing frame to get up to the recliner chair.  Pt unable to extend trunk and essentially forward flexes over the front bar.    Ambulation/Gait             General Gait Details: unable at this time.           Balance Overall balance assessment: Needs assistance Sitting-balance support: Feet supported;Bilateral upper extremity supported Sitting balance-[name] Scale: Poor Sitting balance - Comments: need mod assist EOB with bil hand prop.  Generally posterior lean and some treamoring once upright.    Standing balance support: Bilateral upper extremity supported Standing balance-[name] Scale: Zero Standing balance comment: two person max                            Cognition Arousal/Alertness: Awake/alert Behavior During Therapy: Restless;Agitated;Flat affect Overall Cognitive Status: History of cognitive impairments - at baseline                                               Pertinent Vitals/Pain Pain Assessment: Faces Faces Pain Scale: Hurts even more Pain Location: "all over", unable to specify  Pain Descriptors / Indicators: Grimacing;Moaning Pain Intervention(s): Limited activity within patient's tolerance;Monitored during session;Repositioned           PT Goals (current goals can now be found in  the care plan section) Progress towards PT goals: Progressing toward goals    Frequency    Min 2X/week      PT Plan Frequency needs to be updated    Co-evaluation PT/OT/SLP Co-Evaluation/Treatment: Yes Reason for Co-Treatment: Complexity of the patient's impairments (multi-system involvement);Necessary to address cognition/behavior during functional activity;For patient/therapist safety;To address functional/ADL transfers          AM-PAC PT "6 Clicks" Mobility   Outcome Measure  Help needed turning from your back to your side while in a flat bed without using bedrails?:  Total Help needed moving from lying on your back to sitting on the side of a flat bed without using bedrails?: Total Help needed moving to and from a bed to a chair (including a wheelchair)?: Total Help needed standing up from a chair using your arms (e.g., wheelchair or bedside chair)?: Total Help needed to walk in hospital room?: Total Help needed climbing 3-5 steps with a railing? : Total 6 Click Score: 6    End of Session Equipment Utilized During Treatment: Gait belt Activity Tolerance: Patient limited by fatigue Patient left: in chair;with call bell/phone within reach;with chair alarm set;with family/visitor present Nurse Communication: Mobility status;Need for lift equipment(use maxi move for back to bed, lift pad in chair) PT Visit Diagnosis: Muscle weakness (generalized) (M62.81);Unsteadiness on feet (R26.81);Other abnormalities of gait and mobility (R26.89);Other symptoms and signs involving the nervous system (Z61.096(R29.898)     Time: [id number] PT Time Calculation (min) (ACUTE ONLY): 33 min  Charges:  $Therapeutic Activity: 8-22 mins                    Kern Gingras B. Kaylianna Detert, PT, DPT  Acute Rehabilitation [phone] pager #[phone] office   07/15/2018, 3:54 PM

## 2018-07-15 NOTE — Evaluation (Signed)
Occupational Therapy Evaluation Patient Details Name: Wayne Buckley MRN: [id number] DOB: Jun 08, 1951 Today's Date: 07/15/2018    History of Present Illness 68 y.o. male admitted 07/05/18 from SNF rehab with symptomatic anemia and MRSA bacteremia empyema.  Pt found to have L sided PNA with effusion requiring US guided thoracentesis (07/06/18) and pigtail chest tube placement in IR (07/09/18).  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    Clinical Impression   Pt progressing towards established OT goals. Continues to present with decreased strength, balance, and activity tolerance. Pt requiring Max A +2 and stedy to perform transfer OOB to recliner. Pt participating in RUE exercises for edema management. Pt very motivated by his wife and she provided good support and encouragement. Continue to recommend dc to post-acute rehab and will continue to follow acutely as admitted.     Follow Up Recommendations  SNF;Supervision/Assistance - 24 hour    Equipment Recommendations  Other (comment)(Defer to next venue)    Recommendations for Other Services PT consult     Precautions / Restrictions Precautions Precautions: Fall Precaution Comments: TBI and old SCI, very weak in his legs      Mobility Bed Mobility Overal bed mobility: Needs Assistance Bed Mobility: Supine to Sit     Supine to sit: Max assist;+2 for physical assistance;HOB elevated     General bed mobility comments: Two person max assist to help progress bil LEs to EOB and support trunk to come up to sitting.  Pt initially with posterior push which improved once setteled EOB.   Transfers Overall transfer level: Needs assistance   Transfers: Sit to/from Stand;Stand Pivot Transfers Sit to Stand: +2 physical assistance;Max assist;From elevated surface Stand pivot transfers: (used the steady pt forward flexed over front  handle bar)       General transfer comment: Two person max assist to stand from elevated bed and elevated steady standing frame to get up to the recliner chair.  Pt unable to extend trunk and essentially forward flexes over the front bar.      Balance Overall balance assessment: Needs assistance Sitting-balance support: Feet supported;Bilateral upper extremity supported Sitting balance-[name] Scale: Poor Sitting balance - Comments: need mod assist EOB with bil hand prop.  Generally posterior lean and some treamoring once upright.    Standing balance support: Bilateral upper extremity supported Standing balance-[name] Scale: Zero Standing balance comment: two person max                           ADL either performed or assessed with clinical judgement   ADL Overall ADL's : Needs assistance/impaired                         Toilet Transfer: Maximal assistance;+2 for physical assistance(sit<>stand with stedy. ) Toilet Transfer Details (indicate cue type and reason): Max A +2 to power up into standing with use of bed pad to elevate hips. Pt using stedy to pull up into standing and then would lean over stedy rail         Functional mobility during ADLs: Maximal assistance;+2 for physical assistance(stedy) General ADL Comments: Pt requiring Max A +2 for use of stedy to get OOB to recliner     Vision         Perception     Praxis      Pertinent Vitals/Pain Pain  Assessment: Faces Faces Pain Scale: Hurts even more Pain Location: "all over", unable to specify  Pain Descriptors / Indicators: Grimacing;Moaning Pain Intervention(s): Monitored during session;Limited activity within patient's tolerance;Repositioned     Hand Dominance     Extremity/Trunk Assessment Upper Extremity Assessment Upper Extremity Assessment: Difficult to assess due to impaired cognition RUE Deficits / Details: Increased swelling at RUE. Decreased coorindation and ataxic movement at  baseline   Lower Extremity Assessment Lower Extremity Assessment: Defer to PT evaluation       Communication     Cognition Arousal/Alertness: Awake/alert Behavior During Therapy: Restless;Agitated;Flat affect Overall Cognitive Status: History of cognitive impairments - at baseline                                     General Comments  Wife present throughout session    Exercises Exercises: General Upper Extremity General Exercises - Upper Extremity Shoulder Flexion: AAROM;Right;10 reps;Supine Shoulder Extension: AAROM;Right;10 reps;Supine Elbow Flexion: AAROM;Right;10 reps;Supine Elbow Extension: AAROM;Right;10 reps;Supine Wrist Flexion: AAROM;Right;10 reps;Supine Wrist Extension: AAROM;Right;10 reps;Supine Digit Composite Flexion: AROM;Right;10 reps;Supine Composite Extension: AROM;Right;10 reps;Supine   Shoulder Instructions      Home Living                                          Prior Functioning/Environment                   OT Problem List:        OT Treatment/Interventions:      OT Goals(Current goals can be found in the care plan section) Acute Rehab OT Goals Patient Stated Goal: to get stronger OT Goal Formulation: With patient/family Time For Goal Achievement: 07/24/18 Potential to Achieve Goals: Good ADL Goals Pt Will Perform Eating: with set-up Pt Will Perform Grooming: with set-up Pt Will Transfer to Toilet: with mod assist Additional ADL Goal #1: Pt will follow two step commands 3x during ADL completion.  OT Frequency: Min 2X/week   Barriers to D/C:            Co-evaluation PT/OT/SLP Co-Evaluation/Treatment: Yes Reason for Co-Treatment: Complexity of the patient's impairments (multi-system involvement)   OT goals addressed during session: ADL's and self-care      AM-PAC OT "6 Clicks" Daily Activity     Outcome Measure Help from another person eating meals?: A Lot Help from another person taking  care of personal grooming?: A Lot Help from another person toileting, which includes using toliet, bedpan, or urinal?: Total Help from another person bathing (including washing, rinsing, drying)?: A Lot Help from another person to put on and taking off regular upper body clothing?: A Lot Help from another person to put on and taking off regular lower body clothing?: Total 6 Click Score: 10   End of Session Equipment Utilized During Treatment: Gait belt;Other (comment)(stedy) Nurse Communication: Mobility status;Need for lift equipment  Activity Tolerance: Patient tolerated treatment well Patient left: in chair;with call bell/phone within reach;with chair alarm set;with nursing/sitter in room  OT Visit Diagnosis: Unsteadiness on feet (R26.81);Other abnormalities of gait and mobility (R26.89);Muscle weakness (generalized) (M62.81);History of falling (Z91.81);Other symptoms and signs involving cognitive function;Pain Pain - part of body: (Generalized)                Time: 1100-3496 OT Time Calculation (min): 33 min Charges:  OT General Charges $OT Visit: 1 Visit OT Treatments $Self Care/Home Management : 8-22 mins  Vasilia Dise MSOT, OTR/L Acute Rehab Pager: [phone] Office: [phone]   Theodoro GristCharis M Esther Bradstreet 07/15/2018, 4:43 PM

## 2018-07-15 NOTE — Care Management Important Message (Signed)
Important Message  Patient Details  Name: Wayne Buckley MRN: [id number] Date of Birth: [birth year]   Medicare Important Message Given:  Yes    Leone Haven, RN 07/15/2018, 12:51 PM

## 2018-07-15 NOTE — Progress Notes (Signed)
PROGRESS NOTE  Wayne Buckley YQM:[id number] DOB: [birth year] DOA: 07/05/2018 PCP: Patient, No Pcp Per  Brief History   68 year old male PMH TBI, subdural hemorrhage with left partial frontal lobectomy, cervical spinal cord injury, presented for evaluation of suspected anemia, was admitted for pneumonia.  Was subsequently found to have recurrent MRSA bacteremia with left-sided pneumonia, loculated parapneumonic effusion, thoracic and lumbar spine discitis.  He underwent CT-guided chest tube placement and is followed by pulmonology and interventional radiology.  Given abnormal MRI findings, he has been followed by neurosurgery.  A & P  Recurrent MRSA bacteremia with left-sided pneumonia with loculated left effusion/empyema, acute hypoxic respiratory failure 1/24.  Status post lytics 1/24. --Continues to improve.  No hypoxia.  Afebrile.  Chest tube continues to drain.   --CT tomorrow to reassess left hemithorax.  Further recommendations from pulmonology to follow.  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  Not a candidate for TEE in the past secondary to possible esophageal obstruction. Repeat blood cultures no growth, final --Continue prolonged IV antibiotics as recommended by infectious disease, 8-10 weeks with repeat imaging in 6-8 weeks. Tunnel PICC given involuntary movements in arms planned in near future (was canceled 1/24 given patient instability). --Neurosurgery recommended medical management and there are no plans for surgical intervention at this point as of the evaluation 1/24.  PMH traumatic brain injury, PMH C5-C7 spinal cord injury, chronic involuntary movements of the extremities, chronic urinary retention with chronic indwelling Foley catheter  Essential hypertension --Remains stable, continue carvedilol   Iron deficiency anemia.  Continue iron supplementation.  Status  post IV iron.  Status post transfusion 3 units PRBC. --Hemoglobin remained stable  PMH DVT --Will continue therapeutic Lovenox until after placement of tunneled catheter.  Then resume apixaban.  Unstageable pressure injury of the bilateral buttocks present on admission --Continue wound care   PMH TBI, cord compression.  Resident in SNF.  Chronic Foley for neurogenic bladder.   Clinically continues to improve.  Follow-up repeat CT tomorrow and further pulmonology recommendations when available.  Continue IV vancomycin.  DVT prophylaxis: enoxaparin Code Status: Full Family Communication: Disposition Plan: SNF    Brendia Sacks, MD  [facility] Direct contact: see [email or web address]  7PM-7AM contact night coverage as above 07/15/2018, 6:14 PM  LOS: 9 days   Consultants  . ID . Neurosurgery  . IR . Pulmonology  Procedures  . Diagnostic left thoracentesis . CT-guided left chest tube placement 1/20 . MRI under anesthesia . Echo Study Conclusions  - Left ventricle: The cavity size was normal. Systolic function was   normal. The estimated ejection fraction was in the range of 60%   to 65%. Wall motion was normal; there were no regional wall   motion abnormalities. Doppler parameters are consistent with   abnormal left ventricular relaxation (grade 1 diastolic   dysfunction). - Ventricular septum: Septal motion showed &quot;bounce&quot;. - Aortic valve: Transvalvular velocity was within the normal range.   There was no stenosis. There was no regurgitation. - Mitral valve: Transvalvular velocity was within the normal range.   There was no evidence for stenosis. There was no regurgitation. - Right ventricle: The cavity size was normal. Wall thickness was   normal. Systolic function was normal. - Atrial septum: No defect or patent foramen ovale was identified   by color flow Doppler. - Tricuspid valve: There was no regurgitation.  Antibiotics  .   Interval  History/Subjective  Feels better  today.  Breathing fine.  No complaints.  Objective   Vitals:  Vitals:   07/15/18 0737 07/15/18 1638  BP: 125/70 118/77  Pulse: 91 (!) 101  Resp:    Temp: 99.6 F (37.6 C) (!) 97.3 F (36.3 C)  SpO2: 94% 93%    Exam: Constitutional:   . Appears calm and comfortable Respiratory:  . CTA bilaterally, no w/r/r.  . Respiratory effort normal.  Cardiovascular:  . RRR, no m/r/g . No significant LE extremity edema   Musculoskeletal:  . RUE, LUE, RLE, LLE   . Involuntary movements arms and legs noted.  Able to move to command. Neurologic:  . No gross change, able to move both legs to command. Psychiatric:  . Mental status o Mood, affect appropriate  I have personally reviewed the following:   Today's Data  . Urine output 625, chest tube output 170.  Basic metabolic panel unremarkable.  CBC unremarkable.  Lab Data  .   Micro Data  .   Imaging   CT chest showed large loculated left pleural effusion with upper and lower lobe consolidation.  Multiple MRIs noted  Cardiology Data  .   Other Data  .   Scheduled Meds: . carvedilol  6.25 mg Oral BID WC  . collagenase   Topical Daily  . enoxaparin (LOVENOX) injection  80 mg Subcutaneous BID  . ferrous sulfate  325 mg Oral BID WC  . polyethylene glycol  17 g Oral BID  . senna  1 tablet Oral QHS  . tamsulosin  0.4 mg Oral Daily   Continuous Infusions: . sodium chloride 150 mL/hr at 07/15/18 1415  . lactated ringers Stopped (07/09/18 7353)  . vancomycin 1,000 mg (07/15/18 0521)    Principal Problem:   MRSA bacteremia Active Problems:   Urinary retention   C5-C7 level spinal cord injury [facility])   Essential hypertension   Chronic anemia   Traumatic brain injury (HCC)   Pleural effusion on left   Vertebral osteomyelitis (HCC)   Pressure injury of skin   Acute hypoxemic respiratory failure (HCC)   Atelectasis   Empyema (HCC)   LOS: 9 days

## 2018-07-15 NOTE — Progress Notes (Addendum)
NAME:  Wayne Buckley, MRN:  [id number], DOB:  09/04/1950, LOS: 9 ADMISSION DATE:  07/05/2018, CONSULTATION DATE:  1/19 REFERRING MD:  Dr. Lynden OxfordPranav Patel [facility]), CHIEF COMPLAINT:  Pleural Effusion   Brief History   68 yo M, admitted 1/17, recent admission for MRSA bacteremia & UTI in setting of chronic foley [facility] ED from rehab facility for acute on chronic anemia requiring transfusion.  ECHO during prior admit showed no evidence of vegetation (unable to perform TEE). He was discharged to SNF on 1/13.    Found to have left sided pneumonia with parapneumonic effusion. Blood cultures on admission grew MRSA bacteremia. Underwent US guided thoracentesis 1/18 that yielded 90 cc of fluid. Unable to aspiration additional fluid due to loculations. CT Chest with large loculated effusion and evidence of T11-T12 and T12-L1 diskitis / osteomyelitis.   Past Medical History  HTN, chronic anemia requiring recurrent transfusion (no clinical bleeding), history of TBI with neurogenic bladder, chronic indwelling foley, and DVT  Significant Hospital Events   1/17 Re admit from SNF (recent discharge for MRSA bacteremia) 1/24 Intra pleural tPA, pulmozyme, 100% NRB 1/25 CT 940 cc out 24 hours, another 200 cc out since 7 this morning 1/27 Off O2  Consults:  PCCM  ID IR  Procedures:  1/18 US guided thoracentesis >> LDH 146, Protein 3.9, WBC 1,755 1/21 Left pigtail chest tube drain placement per IR  Significant Diagnostic Tests:  1/18 CTA Chest >> negative for PE. 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 Posterior subdural fluid collection extending from T6 through T10 compatible with abscess. 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  Discitis and osteomyelitis at   T10-11, T11-T12, T12-L1, L1-2,L4-5 . Per Neurosurgery, does not need surgery now.  Loculated left effusion, concern for infection.   Micro Data:  1/17 Blood cx >> MRSA 1/17 Urine cx >> Negative 1/18 Thoracentesis fluid >> negative  1/18 Blood Culture >> negative  Pleural fluid 1/22 >>   Antimicrobials:  1/17 Cefepime x 1 1/17 Metronidazole x 1 1/17 Vancomycin >>   Interim history/subjective:  Pt off O2, no acute events.    Objective   Blood pressure 125/70, pulse 91, temperature 99.6 F (37.6 C), resp. rate 18, height 5\' 9"  (1.753 m), weight 79.1 kg, SpO2 94 %.        Intake/Output Summary (Last 24 hours) at 07/15/2018 1135 Last data filed at 07/15/2018 0522 Gross per 24 hour  Intake 505.65 ml  Output 795 ml  Net -289.35 ml   Filed Weights   07/05/18 1900 07/06/18 0139  Weight: 114.8 kg 79.1 kg    Examination: General: chronically ill appearing male lying in bed in NAD HEENT: MM pink/moist, fair dentition  Neuro: Awakens, alert, hx TBI / slow to answer but appropriate  CV: s1s2 rrr, no m/r/g PULM: even/non-labored, on RA, clear on R, diminished on L, chest tube on left draining clear yellow pleural fluid WU:XLKGGI:soft, non-tender, bsx4 active  Extremities: warm/dry, trace BLE edema  Skin: no rashes or lesions  Resolved Hospital Problem list     Assessment & Plan:   Left Upper and Lower Lobe PNA with loculated pleural effusion -Thora 1/18 with 90 ml fluid / unable to completely drain due to loculation -IR CT placed 1/21 with 650 ml drained  P: Follow CXR Continue CT care per protocol  Hold further tPA/DNase for now  ABX per primary  May still need TCTS to review for possible surgical intervention  MRSA Bacteremia T11-T12, T12-L1 Diskitis / Osteomyelitis Epidural Abscess  -unable to perform TEE due to cervical hardware P: ID, NSGY following  Vanco as ordered  Will need spine re-imaging in 6 weeks   Best practice:  Diet: heart healthy  Pain/Anxiety/Delirium  protocol (if indicated): N/A VAP protocol (if indicated): N/A DVT prophylaxis: Eliquis GI prophylaxis: N/A Glucose control: monitor Mobility: as toleraged Code Status: FULL Family Communication: Wife updated 1/24. No family at bedside 1/27.  Disposition: [facility].  PCCM will continue to follow.   Labs   CBC: Recent Labs  Lab 07/09/18 1725 07/10/18 2641 07/12/18 0332 07/13/18 0219 07/14/18 0301 07/15/18 0250  WBC 8.0 11.7* 12.4* 15.9* 10.2 9.6  NEUTROABS 5.9  --   --   --   --   --   HGB 9.7* 9.2* 8.7* 8.9* 8.4* 8.7*  HCT 33.4* 30.3* 28.3* 28.8* 27.1* 28.7*  MCV 89.5 87.8 86.0 87.0 88.0 87.8  PLT 202 559* 496* 525* 434* 463*    Basic Metabolic Panel: Recent Labs  Lab 07/10/18 0652 07/12/18 0332 07/13/18 0219 07/14/18 0301 07/15/18 0250  NA 135 133* 134* 135 135  K 3.5 3.6 3.7 3.2* 3.6  CL 106 104 100 104 105  CO2 24 24 23  21* 21*  GLUCOSE 101* 81 80 84 86  BUN 6* 6* 7* 8 7*  CREATININE 0.70 0.61 0.74 0.65 0.63  CALCIUM 8.1* 7.9* 8.1* 7.6* 7.7*   GFR: Estimated Creatinine Clearance: 88.4 mL/min (by C-G formula based on SCr of 0.63 mg/dL). Recent Labs  Lab 07/12/18 0332 07/13/18 0219 07/14/18 0301 07/15/18 0250  WBC 12.4* 15.9* 10.2 9.6    Liver Function Tests: Recent Labs  Lab 07/09/18 1725  AST 17  ALT 8  ALKPHOS 96  BILITOT 0.5  PROT 5.8*  ALBUMIN 1.5*   No results for input(s): LIPASE, AMYLASE in the last 168 hours. No results for input(s): AMMONIA in the last 168 hours.  ABG    Component Value Date/Time   PHART 7.429 04/29/2012 0453   PCO2ART 33.0 (L) 04/29/2012 0453   PO2ART 138.0 (H) 04/29/2012 0453   HCO3 21.5 04/29/2012 0453   TCO2 22.5 04/29/2012 0453   ACIDBASEDEF 2.2 (H) 04/29/2012 0453   O2SAT 99.3 04/29/2012 0453     Coagulation Profile: Recent Labs  Lab 07/11/18 1113  INR 1.34    Cardiac Enzymes: No results for input(s): CKTOTAL, CKMB, CKMBINDEX, TROPONINI in the last 168 hours.  HbA1C: No results found for:  HGBA1C  CBG: No results for input(s): GLUCAP in the last 168 hours.  Canary Brim, NP-C [facility] Pgr: [phone] or if no answer [phone] 07/15/2018, 11:36 AM  Attending Note:  68 year old male with MRSA empyema with loculation s/p tPA/pulmozyme instillation on Friday who's CXR has improved that I reviewed myself.  No events overnight.  On exam, crackles bibasilarly.  Discussed with PCCM-NP.   Pneumonia:  - Abx as ordered  - F/U on cultures  Empyema:  - Abx as above  - Repeat chest CT in AM, if loculations are noted then will need input from CVTS, not sure if a surgical candidate.  Hypoxemia:  - Titrate O2 for sat of 88-92%  PCCM will continue to follow.  Patient seen and examined, agree with above note.  I dictated the care and  orders written for this patient under my direction.  Alyson ReedyYacoub, Wesam G, MD [phone]

## 2018-07-15 NOTE — Consult Note (Signed)
WOC Nurse wound consult note Reason for Consult: Unstageable pressure injury to sacrum and bilateral upper buttocks.  Present on admission Wound type:unstageable pressure injury Pressure Injury POA: Yes Measurement: 4 cm x 5.2 cm wound bed is eschar Wound WPV:[id number] tissue Drainage (amount, consistency, odor) minimal serosanguinous with musty odor Periwound:erythema Dressing procedure/placement/frequency: Cleanse sacral wound with NS and pat dry. APply Santyl to wound bed. Cover with NS moist gauze and then dry dressing.  Will order PT to assess for hydrotherapy.  Will not follow at this time.  Please re-consult if needed.  Maple Hudson MSN, RN, FNP-BC CWON Wound, Ostomy, Continence Nurse Pager [phone]

## 2018-07-16 ENCOUNTER — Inpatient Hospital Stay (HOSPITAL_COMMUNITY): Payer: Medicare Other

## 2018-07-16 LAB — CBC
HCT: 24.8 % — ABNORMAL LOW (ref 39.0–52.0)
Hemoglobin: 7.7 g/dL — ABNORMAL LOW (ref 13.0–17.0)
MCH: 27.1 pg (ref 26.0–34.0)
MCHC: 31 g/dL (ref 30.0–36.0)
MCV: 87.3 fL (ref 80.0–100.0)
Platelets: 391 10*3/uL (ref 150–400)
RBC: 2.84 MIL/uL — ABNORMAL LOW (ref 4.22–5.81)
RDW: 16.2 % — ABNORMAL HIGH (ref 11.5–15.5)
WBC: 9.3 10*3/uL (ref 4.0–10.5)

## 2018-07-16 MED ORDER — FUROSEMIDE 20 MG PO TABS
20.0000 mg | ORAL_TABLET | Freq: Once | ORAL | Status: AC
  Administered 2018-07-16: 20 mg via ORAL
  Filled 2018-07-16: qty 1

## 2018-07-16 NOTE — Progress Notes (Signed)
PROGRESS NOTE  Wayne HunJames M Pallett ZOX:[id number] DOB: 03/19/1951 DOA: 07/05/2018 PCP: Patient, No Pcp Per  Brief History   68 year old male PMH TBI, subdural hemorrhage with left partial frontal lobectomy, cervical spinal cord injury, presented for evaluation of suspected anemia, was admitted for pneumonia.  Was subsequently found to have recurrent MRSA bacteremia with left-sided pneumonia, loculated parapneumonic effusion, thoracic and lumbar spine discitis.  He underwent CT-guided chest tube placement and is followed by pulmonology and interventional radiology.  Given abnormal MRI findings, he has been followed by neurosurgery.  Neurosurgery recommended long-term IV antibiotics, no invasive intervention.  Pulmonology following chest tube and loculated pleural effusion.  Should be able to go to SNF when chest tube has been removed.  Continue long-term IV antibiotics.  A & P  Recurrent MRSA bacteremia with left-sided pneumonia with loculated left effusion/empyema, acute hypoxic respiratory failure 1/24.  Status post lytics 1/24. --Respiratory status remained stable.  Follow-up chest CT showed improvement but still significant loculated fluid.  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  Not a candidate for TEE in the past secondary to possible esophageal obstruction. Repeat blood cultures no growth, final --Continue prolonged IV antibiotics as recommended by infectious disease, 8-10 weeks with repeat imaging in 6-8 weeks. Tunnel PICC given involuntary movements in arms planned in near future (was canceled 1/24 given patient instability). --Neurosurgery recommended medical management and there are no plans for surgical intervention at this point as of the evaluation 1/24.  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  Continue iron supplementation.  Status post IV iron.  Status post transfusion 3 units PRBC. --Hemoglobin stable.  PMH DVT --Will continue therapeutic Lovenox until after placement of tunneled catheter.  Then resume apixaban.  Unstageable pressure injury of the bilateral buttocks present on admission --Continue wound care   PMH TBI, cord compression.  Resident in SNF.  Chronic Foley for neurogenic bladder.   Continue IV antibiotic.  Follow-up pulmonology recommendations.  Will remain hospitalized until able to remove chest tube.  DVT prophylaxis: enoxaparin Code Status: Full Family Communication: Disposition Plan: SNF    Brendia Sacksaniel Governor Matos, MD  [facility] Direct contact: see [email or web address]  7PM-7AM contact night coverage as above 07/16/2018, 3:42 PM  LOS: 10 days   Consultants  . ID . Neurosurgery  . IR . Pulmonology  Procedures  . Diagnostic left thoracentesis . CT-guided left chest tube placement 1/20 . MRI under anesthesia . Echo Study Conclusions  - Left ventricle: The cavity size was normal. Systolic function was   normal. The estimated ejection fraction was in the range of 60%   to 65%. Wall motion was normal; there were no regional wall   motion abnormalities. Doppler parameters are consistent with   abnormal left ventricular relaxation (grade 1 diastolic   dysfunction). - Ventricular septum: Septal motion showed &quot;bounce&quot;. - Aortic valve: Transvalvular velocity was within the normal range.   There was no stenosis. There was no regurgitation. - Mitral valve: Transvalvular velocity was within the normal range.   There was no evidence for stenosis. There was no regurgitation. - Right ventricle: The cavity size was normal. Wall thickness was   normal. Systolic function was normal. - Atrial septum: No defect or  patent foramen ovale was identified   by color flow  Doppler. - Tricuspid valve: There was no regurgitation.  Antibiotics  .   Interval History/Subjective  Feels fine today, breathing okay, poor appetite.  Not eating much.  Objective   Vitals:  Vitals:   07/16/18 0847 07/16/18 1045  BP: 122/63   Pulse: 91   Resp:    Temp: 100.1 F (37.8 C) 99.1 F (37.3 C)  SpO2: 94%     Exam: Constitutional:   . Appears calm and comfortable Respiratory:  . CTA bilaterally, no w/r/r.  . Respiratory effort normal.  Cardiovascular:  . RRR, no m/r/g . 2+ bilateral LE extremity edema   Musculoskeletal:  . RUE, LUE, RLE, LLE   . Involuntary movements arms and legs noted Neurologic:  . No apparent change Psychiatric:  . Mental status o Mood, affect appropriate  I have personally reviewed the following:   Today's Data   Urine output 775, chest tube output unrecorded. Hemoglobin lower, 7.7, remainder CBC unremarkable. . Chest CT noted.  Lab Data  .   Micro Data  .   Imaging   CT chest showed large loculated left pleural effusion with upper and lower lobe consolidation.  Multiple MRIs noted  Cardiology Data  .   Other Data  .   Scheduled Meds: . carvedilol  6.25 mg Oral BID WC  . collagenase   Topical Daily  . enoxaparin (LOVENOX) injection  80 mg Subcutaneous BID  . ferrous sulfate  325 mg Oral BID WC  . polyethylene glycol  17 g Oral BID  . senna  1 tablet Oral QHS  . tamsulosin  0.4 mg Oral Daily   Continuous Infusions: . lactated ringers 10 mL/hr at 07/16/18 0445  . vancomycin 1,000 mg (07/16/18 0546)    Principal Problem:   MRSA bacteremia Active Problems:   Urinary retention   C5-C7 level spinal cord injury (HCC)   Essential hypertension   Chronic anemia   Traumatic brain injury (HCC)   Pleural effusion on left   Vertebral osteomyelitis (HCC)   Pressure injury of skin   Acute hypoxemic respiratory failure (HCC)   Atelectasis   Empyema (HCC)    LOS: 10 days

## 2018-07-16 NOTE — Consult Note (Signed)
WOC Nurse wound follow up Wound type:unstageable pressure injury, assessed yesterday. Hydrotherapy was able to debride off devitalized tissue (eschar) that was present.  Continue daily Santyl.  Bedside RN to perform.  Will not follow at this time.  Please re-consult if needed.  Maple Hudson MSN, RN, FNP-BC CWON Wound, Ostomy, Continence Nurse Pager [phone]

## 2018-07-16 NOTE — Clinical Social Work Note (Signed)
Pt's spouse has chosen [facility].  [place], [place] [phone]

## 2018-07-16 NOTE — Progress Notes (Signed)
Pharmacy Antibiotic Note  Wayne Buckley is a 68 y.o. male admitted on 07/05/2018 with Thoracolumbar osteomyelitis, Empyema, Bacteremia - MRSA.  Pharmacy consulted 07/05/18 for vancomycin dosing.  Tc 99.1, Tmax of 100.1, WBC wnl. Scr wnl stable, CrCl 88 ml/min.  Planning at least 6 weeks of therapy per ID  , end date is 3/28.  Plan: Continue Vancomycin 1000 mg IV every 24 hours. (projected new AUC 441) OPAT updated on 1/23  Stop date for Vanc is 3/28 Monitor Vancomycin levels weekly every Thursday. Monitor clinical progress, cultures/sensitivities, renal function.  Height: 5\' 9"  (175.3 cm) Weight: 174 lb 6.1 oz (79.1 kg) IBW/kg (Calculated) : 70.7  Temp (24hrs), Avg:98.9 F (37.2 C), Min:97.3 F (36.3 C), Max:100.1 F (37.8 C)  Recent Labs  Lab 07/10/18 0652 07/11/18 0618 07/12/18 0332 07/13/18 0219 07/14/18 0301 07/15/18 0250 07/16/18 0247  WBC 11.7*  --  12.4* 15.9* 10.2 9.6 9.3  CREATININE 0.70  --  0.61 0.74 0.65 0.63  --   VANCOTROUGH  --  16  --   --   --   --   --   VANCOPEAK 41*  --   --   --   --   --   --     Estimated Creatinine Clearance: 88.4 mL/min (by C-G formula based on SCr of 0.63 mg/dL).    No Known Allergies   Antibiotics and Dose adjustments:  1/18 Vanc >>   VP on 1/22 was 41, VP on 1/23 was 16 (AUC of 662 on 750mg  Q12h; dose decreased) 1/17 Cefepime>>1/17   Microbiology:  1/17 urine>>neg 1/17 blood>>MRSA 1/18 MRSA>>positive 1/18 BCID>>MRSA 1/18 blood>>negative 1/18 throacentesis fluid: negative 1/22: pleural fluid: ngtd   Thank you for allowing pharmacy to be part of this patients care team.  Noah Delaine, RPh Clinical Pharmacist [phone] Please utilize Amion for appropriate phone number to reach the unit pharmacist [facility]) 07/16/2018 12:16 PM

## 2018-07-16 NOTE — Progress Notes (Signed)
Called for pt to come to radiology for procedure. Wife states that pt is not ready as MD told her that pt infection needed to clear. Floor RN asks MD about this and floor RN tells transporter that MD will cancel order for IR procedure.

## 2018-07-16 NOTE — Progress Notes (Signed)
Physical Therapy Wound Evaluation and Discharge Patient Details  Name: Wayne Buckley MRN: [id number] Date of Birth: 04/19/1951  Today's Date: 07/16/2018 Time: 0928-1005 Time Calculation (min): 37 min  Subjective  Subjective: Pt stated "just a little" when asked if his buttocks hurt Patient and Family Stated Goals: Sleep Date of Onset: (unknown) Prior Treatments: dressing change  Pain Score: Pain Score: 0-No pain  Wound Assessment  Pressure Injury 07/10/18 Stage II -  Partial thickness loss of dermis presenting as a shallow open ulcer with a red, pink wound bed without slough. sacrum (Active)  Wound Image   07/16/2018 10:23 AM  Dressing Type Foam;Barrier Film (skin prep);Moist to dry 07/16/2018 10:23 AM  Dressing Changed;Clean;Dry;Intact 07/16/2018 10:23 AM  Dressing Change Frequency Daily 07/16/2018 10:23 AM  State of Healing Early/partial granulation 07/16/2018 10:23 AM  Site / Wound Assessment Pink;Red;Yellow 07/16/2018 10:23 AM  % Wound base Red or Granulating 90% 07/16/2018 10:23 AM  % Wound base Yellow/Fibrinous Exudate 10% 07/16/2018 10:23 AM  % Wound base Black/Eschar 0% 07/16/2018 10:23 AM  % Wound base Other/Granulation Tissue (Comment) 0% 07/16/2018 10:23 AM  Peri-wound Assessment Other (Comment) 07/14/2018  8:00 AM  Wound Length (cm) 1 cm 07/16/2018 10:23 AM  Wound Width (cm) 1.5 cm 07/16/2018 10:23 AM  Wound Depth (cm) 0.1 cm 07/16/2018 10:23 AM  Wound Surface Area (cm^2) 1.5 cm^2 07/16/2018 10:23 AM  Wound Volume (cm^3) 0.15 cm^3 07/16/2018 10:23 AM  Drainage Amount Scant 07/16/2018 10:23 AM  Drainage Description Serous 07/16/2018 10:23 AM  Treatment Hydrotherapy (Pulse lavage) 07/16/2018 10:23 AM      Hydrotherapy Pulsed lavage therapy - wound location: sacrum Pulsed Lavage with Suction (psi): 4 psi(4-8) Pulsed Lavage with Suction - Normal Saline Used: 1000 mL Pulsed Lavage Tip: Tip with splash shield   Wound Assessment and Plan  Wound Therapy - Assess/Plan/Recommendations Wound  Therapy - Clinical Statement: Pt presents to hydrotherapy with small stage II area on sacrum. After pulsatile lavage area is mostly pink, red with small areas of yellow slough. Do not feel pt needs further hydrotherapy and will sign off.  Wound Therapy - Functional Problem List: Decr mobility Factors Delaying/Impairing Wound Healing: Incontinence;Immobility;Multiple medical problems Hydrotherapy Plan: (DC hydrotherapy) Wound Therapy - Follow Up Recommendations: Skilled nursing facility Wound Plan: Hydrotherapy to sign off. Nsg to perform dressing changes.  Wound Therapy Goals- Improve the function of patient's integumentary system by progressing the wound(s) through the phases of wound healing (inflammation - proliferation - remodeling) by:    Goals will be updated until maximal potential achieved or discharge criteria met.  Discharge criteria: when goals achieved, discharge from hospital, MD decision/surgical intervention, no progress towards goals, refusal/missing three consecutive treatments without notification or medical reason.  GP     Shary Decamp [facility] 07/16/2018, 10:34 AM [place] Pager [phone] Office [phone]

## 2018-07-16 NOTE — Progress Notes (Addendum)
NAME:  Wayne Buckley, MRN:  [id number], DOB:  [birth year], LOS: 10 ADMISSION DATE:  07/05/2018, CONSULTATION DATE:  1/19 REFERRING MD:  Dr. Lynden Oxford [facility]), CHIEF COMPLAINT:  Pleural Effusion   Brief History   68 yo M, admitted 1/17, recent admission for MRSA bacteremia & UTI in setting of chronic foley sent to [facility] ED from rehab facility for acute on chronic anemia requiring transfusion.  ECHO during prior admit showed no evidence of vegetation (unable to perform TEE). He was discharged to SNF on 1/13.    Found to have left sided pneumonia with parapneumonic effusion. Blood cultures on admission grew MRSA bacteremia. Underwent US guided thoracentesis 1/18 that yielded 90 cc of fluid. Unable to aspiration additional fluid due to loculations. CT Chest with large loculated effusion and evidence of T11-T12 and T12-L1 diskitis / osteomyelitis.   Past Medical History  HTN, chronic anemia requiring recurrent transfusion (no clinical bleeding), history of TBI with neurogenic bladder, chronic indwelling foley, and DVT  Significant Hospital Events   1/17 Re admit from SNF (recent discharge for MRSA bacteremia) 1/24 Intra pleural tPA, pulmozyme, 100% NRB 1/25 CT 940 cc out 24 hours, another 200 cc out since 7 this morning 1/27 Off O2  Consults:  PCCM  ID IR  Procedures:  1/18 US guided thoracentesis >> LDH 146, Protein 3.9, WBC 1,755 1/21 Left pigtail chest tube drain placement per IR  Significant Diagnostic Tests:  1/18 CTA Chest >> negative for PE. 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 Posterior subdural fluid collection extending from T6 through T10 compatible with abscess. 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  Discitis and osteomyelitis at   T10-11, T11-T12, T12-L1, L1-2,L4-5 . Per Neurosurgery, does not need surgery now.  Loculated left effusion, concern for infection.   CT Chest w/o 1/28 >> left empyema with significant decrease in fluid after chest tube placement.  Still moderate patchy loculated pleural fluid on the left with extensive left sided atelectasis or consolidation  Micro Data:  1/17 Blood cx >> MRSA 1/17 Urine cx >> Negative 1/18 Thoracentesis fluid >> negative  1/18 Blood Culture >> negative  Pleural fluid 1/22 >>   Antimicrobials:  1/17 Cefepime x 1 1/17 Metronidazole x 1 1/17 Vancomycin >>   Interim history/subjective:  Pt reports he doesn't want to do hydrotherapy, states he wants to sleep. CT drainage system has been turned over > difficult to determine drainage.   Objective   Blood pressure 112/72, pulse 79, temperature 99 F (37.2 C), temperature source Oral, resp. rate 19, height 5\' 9"  (1.753 m), weight 79.1 kg, SpO2 94 %.        Intake/Output Summary (Last 24 hours) at 07/16/2018 8938 Last data filed at 07/15/2018 1820 Gross per 24 hour  Intake -  Output 775 ml  Net -775 ml   Filed Weights   07/05/18 1900 07/06/18 0139  Weight: 114.8 kg 79.1 kg    Examination: General: elderly, chronically ill male in NAD   HEENT: MM pink/moist Neuro: Awake, alert, slow speech with TBI but appropriate, generalized weakness  CV: s1s2 rrr, no m/r/g PULM: even/non-labored, clear on right, diminished on left  BO:FBPZ, non-tender, bsx4 active  Extremities: warm/dry, trace BLE edema  Skin: no rashes or lesions  Resolved Hospital Problem list  Assessment & Plan:   Left Upper and Lower Lobe PNA with loculated pleural effusion -Thora 1/18 with 90 ml fluid / unable to completely drain due to loculation -IR CT placed 1/21 with 650 ml drained P: Consider repeat tPA/DNase on scheduled basis.  Will discuss with MD. Follow intermittent CXR  ABX per ID/Primary  Doubt surgical candidate but may need TCTS  review   MRSA Bacteremia T11-T12, T12-L1 Diskitis / Osteomyelitis Epidural Abscess  -unable to perform TEE due to cervical hardware P: ID, NSGY following  Vancomycin as ordered Will need repeat spine imaging in ~ 6 weeks   Best practice:  Diet: heart healthy  Pain/Anxiety/Delirium protocol (if indicated): N/A VAP protocol (if indicated): N/A DVT prophylaxis: Eliquis GI prophylaxis: N/A Glucose control: monitor Mobility: as toleraged Code Status: FULL Family Communication: No family available on NP rounds.    Disposition: TRH.  PCCM will continue to follow.   Labs   CBC: Recent Labs  Lab 07/09/18 1725  07/12/18 0332 07/13/18 0219 07/14/18 0301 07/15/18 0250 07/16/18 0247  WBC 8.0   < > 12.4* 15.9* 10.2 9.6 9.3  NEUTROABS 5.9  --   --   --   --   --   --   HGB 9.7*   < > 8.7* 8.9* 8.4* 8.7* 7.7*  HCT 33.4*   < > 28.3* 28.8* 27.1* 28.7* 24.8*  MCV 89.5   < > 86.0 87.0 88.0 87.8 87.3  PLT 202   < > 496* 525* 434* 463* 391   < > = values in this interval not displayed.    Basic Metabolic Panel: Recent Labs  Lab 07/10/18 0652 07/12/18 0332 07/13/18 0219 07/14/18 0301 07/15/18 0250  NA 135 133* 134* 135 135  K 3.5 3.6 3.7 3.2* 3.6  CL 106 104 100 104 105  CO2 24 24 23  21* 21*  GLUCOSE 101* 81 80 84 86  BUN 6* 6* 7* 8 7*  CREATININE 0.70 0.61 0.74 0.65 0.63  CALCIUM 8.1* 7.9* 8.1* 7.6* 7.7*   GFR: Estimated Creatinine Clearance: 88.4 mL/min (by C-G formula based on SCr of 0.63 mg/dL). Recent Labs  Lab 07/13/18 0219 07/14/18 0301 07/15/18 0250 07/16/18 0247  WBC 15.9* 10.2 9.6 9.3    Liver Function Tests: Recent Labs  Lab 07/09/18 1725  AST 17  ALT 8  ALKPHOS 96  BILITOT 0.5  PROT 5.8*  ALBUMIN 1.5*   No results for input(s): LIPASE, AMYLASE in the last 168 hours. No results for input(s): AMMONIA in the last 168 hours.  ABG    Component Value Date/Time   PHART 7.429 04/29/2012 0453   PCO2ART 33.0 (L) 04/29/2012 0453   PO2ART 138.0 (H)  04/29/2012 0453   HCO3 21.5 04/29/2012 0453   TCO2 22.5 04/29/2012 0453   ACIDBASEDEF 2.2 (H) 04/29/2012 0453   O2SAT 99.3 04/29/2012 0453     Coagulation Profile: Recent Labs  Lab 07/11/18 1113  INR 1.34    Cardiac Enzymes: No results for input(s): CKTOTAL, CKMB, CKMBINDEX, TROPONINI in the last 168 hours.  HbA1C: No results found for: HGBA1C  CBG: No results for input(s): GLUCAP in the last 168 hours.  Canary BrimBrandi Ollis, NP-C [facility] Pgr: [phone] or if no answer [phone] 07/16/2018, 8:21 AM  Attending Note:  68 year old male with MRSA PNA and empyema.  No events overnight, no new complaints.  On exam, decreased BS diffusely.  I reviewed chest CT myself, significant reduction in left sided empyema but remains  with some loculation.  Discussed with PCCM-NP.  Empyema:  - Abx  - Will place additional tPA/DNAse in AM  MRSA bacteremia:  - Abx as ordered  - F/u on further cultures as ordered  Atelectasis:  - IS and flutter valve  Hypoxemia:  - Titrate O2 for sat of 88-92%  PCCM will continue to follow.  Patient seen and examined, agree with above note.  I dictated the care and orders written for this patient under my direction.  Alyson Reedy, M.D. [facility] Pulmonary/Critical Care Medicine. Pager: [phone]. After hours pager: [phone].

## 2018-07-17 ENCOUNTER — Inpatient Hospital Stay: Payer: Medicare Other | Admitting: Internal Medicine

## 2018-07-17 DIAGNOSIS — M4620 Osteomyelitis of vertebra, site unspecified: Secondary | ICD-10-CM

## 2018-07-17 LAB — MAGNESIUM: Magnesium: 1.7 mg/dL (ref 1.7–2.4)

## 2018-07-17 LAB — BASIC METABOLIC PANEL
Anion gap: 7 (ref 5–15)
Anion gap: 6 (ref 5–15)
BUN: 5 mg/dL — ABNORMAL LOW (ref 8–23)
BUN: 6 mg/dL — ABNORMAL LOW (ref 8–23)
CALCIUM: 7.8 mg/dL — AB (ref 8.9–10.3)
CO2: 20 mmol/L — ABNORMAL LOW (ref 22–32)
CO2: 20 mmol/L — AB (ref 22–32)
Calcium: 7.6 mg/dL — ABNORMAL LOW (ref 8.9–10.3)
Chloride: 109 mmol/L (ref 98–111)
Chloride: 110 mmol/L (ref 98–111)
Creatinine, Ser: 0.65 mg/dL (ref 0.61–1.24)
Creatinine, Ser: 0.63 mg/dL (ref 0.61–1.24)
GFR calc non Af Amer: >60 mL/min (ref >60)
GFR calc non Af Amer: >60 mL/min (ref >60)
Glucose, Bld: 95 mg/dL (ref 70–99)
Glucose, Bld: 84 mg/dL (ref 70–99)
Potassium: 3 mmol/L — ABNORMAL LOW (ref 3.5–5.1)
Potassium: 3.5 mmol/L (ref 3.5–5.1)
Sodium: 136 mmol/L (ref 135–145)
Sodium: 136 mmol/L (ref 135–145)

## 2018-07-17 LAB — CBC
HEMATOCRIT: 30.3 % — AB (ref 39.0–52.0)
Hemoglobin: 9 g/dL — ABNORMAL LOW (ref 13.0–17.0)
MCH: 26.4 pg (ref 26.0–34.0)
MCHC: 29.7 g/dL — ABNORMAL LOW (ref 30.0–36.0)
MCV: 88.9 fL (ref 80.0–100.0)
Platelets: 425 10*3/uL — ABNORMAL HIGH (ref 150–400)
RBC: 3.41 MIL/uL — ABNORMAL LOW (ref 4.22–5.81)
RDW: 16.2 % — ABNORMAL HIGH (ref 11.5–15.5)
WBC: 9 10*3/uL (ref 4.0–10.5)
nRBC: 0 % (ref 0.0–0.2)

## 2018-07-17 LAB — TYPE AND SCREEN
ABO/RH(D): O POS
Antibody Screen: NEGATIVE

## 2018-07-17 MED ORDER — STERILE WATER FOR INJECTION IJ SOLN
5.0000 mg | Freq: Two times a day (BID) | RESPIRATORY_TRACT | Status: AC
  Administered 2018-07-17 – 2018-07-18 (×3): 5 mg via INTRAPLEURAL
  Filled 2018-07-17 (×4): qty 5

## 2018-07-17 MED ORDER — SODIUM CHLORIDE (PF) 0.9 % IJ SOLN
10.0000 mg | Freq: Two times a day (BID) | INTRAMUSCULAR | Status: DC
  Administered 2018-07-17 – 2018-07-18 (×3): 10 mg via INTRAPLEURAL
  Filled 2018-07-17 (×4): qty 10

## 2018-07-17 MED ORDER — POTASSIUM CHLORIDE CRYS ER 20 MEQ PO TBCR
40.0000 meq | EXTENDED_RELEASE_TABLET | ORAL | Status: AC
  Administered 2018-07-17 (×2): 40 meq via ORAL
  Filled 2018-07-17 (×2): qty 2

## 2018-07-17 NOTE — Plan of Care (Signed)
  Problem: Education: Goal: Knowledge of General Education information will improve Description Including pain rating scale, medication(s)/side effects and non-pharmacologic comfort measures Outcome: Progressing   Problem: Clinical Measurements: Goal: Will remain free from infection Outcome: Progressing   Problem: Clinical Measurements: Goal: Cardiovascular complication will be avoided Outcome: Progressing   

## 2018-07-17 NOTE — Progress Notes (Signed)
Occupational Therapy Treatment Patient Details Name: Wayne Buckley MRN: [id number] DOB: 11/26/1950 Today's Date: 07/17/2018    History of present illness 68 y.o. male admitted 07/05/18 from SNF rehab with symptomatic anemia and MRSA bacteremia empyema.  Pt found to have L sided PNA with effusion requiring US guided thoracentesis (07/06/18) and pigtail chest tube placement in IR (07/09/18).  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    OT comments  Pt progressing towards acute OT goals. Attempted transfer to recliner using stedy and +2 assist but ultimately unable to complete. Focused instead on prestanding tasks and trunk control sitting EOB. D/c plan remains appropriate.   Follow Up Recommendations  SNF;Supervision/Assistance - 24 hour    Equipment Recommendations  Other (comment)    Recommendations for Other Services      Precautions / Restrictions Precautions Precautions: Fall Precaution Comments: TBI and old SCI, very weak in his legs Restrictions Weight Bearing Restrictions: No       Mobility Bed Mobility Overal bed mobility: Needs Assistance Bed Mobility: Supine to Sit;Sit to Supine Rolling: Max assist   Supine to sit: Max assist;+2 for physical assistance;HOB elevated Sit to supine: Max assist;+2 for physical assistance;HOB elevated   General bed mobility comments: Two person max assist to help progress bil LEs to EOB and support trunk to come up to sitting.  Pt initially with posterior push which improved once setteled EOB.   Transfers Overall transfer level: Needs assistance   Transfers: Sit to/from Stand;Stand Pivot Transfers Sit to Stand: +2 physical assistance;Max assist;From elevated surface         General transfer comment: unable to succesfully use stedy this visit, patient with cues for use of hands, however kept lunging over the bar of Stedy  and not using BUE support. several attempts with +2 therapists max A    Balance Overall balance assessment: Needs assistance Sitting-balance support: Feet supported;Bilateral upper extremity supported Sitting balance-[name] Scale: Poor Sitting balance - Comments: need mod assist EOB with bil hand prop.  Generally posterior lean and some treamoring once upright.  Postural control: Left lateral lean Standing balance support: Bilateral upper extremity supported Standing balance-[name] Scale: Zero Standing balance comment: two person max                           ADL either performed or assessed with clinical judgement   ADL Overall ADL's : Needs assistance/impaired                                       General ADL Comments: Bed mobility and attempted transfer to recliner with stedy but ultimately completed pre standing tasks and worked on EOB trunk control.     Vision       Perception     Praxis      Cognition Arousal/Alertness: Awake/alert Behavior During Therapy: Restless;Agitated;Flat affect Overall Cognitive Status: History of cognitive impairments - at baseline                                 General Comments: at baseline per wife. patient AO to person place, not situation or time. patient agreeable to therapy with wife's encouragement. follows one step commands inconsistently/with extended time;agitated during therapy session, reluctantly  agreed when encouraged by wife        Exercises Exercises: General Lower Extremity General Exercises - Lower Extremity Ankle Circles/Pumps: 20 reps Long Arc Quad: AAROM;10 reps   Shoulder Instructions       General Comments wife present and invovled during session    Pertinent Vitals/ Pain       Pain Assessment: Faces Faces Pain Scale: Hurts even more Pain Location: "all over", unable to specify  Pain Descriptors / Indicators: Grimacing;Moaning Pain Intervention(s): Limited activity  within patient's tolerance;Monitored during session;Repositioned;Premedicated before session  Home Living                                          Prior Functioning/Environment              Frequency  Min 2X/week        Progress Toward Goals  OT Goals(current goals can now be found in the care plan section)  Progress towards OT goals: Progressing toward goals  Acute Rehab OT Goals Patient Stated Goal: to get stronger OT Goal Formulation: With patient/family Time For Goal Achievement: 07/24/18 Potential to Achieve Goals: Good ADL Goals Pt Will Perform Eating: with set-up Pt Will Perform Grooming: with set-up Pt Will Transfer to Toilet: with mod assist Additional ADL Goal #1: Pt will follow two step commands 3x during ADL completion.  Plan Discharge plan remains appropriate    Co-evaluation    PT/OT/SLP Co-Evaluation/Treatment: Yes Reason for Co-Treatment: Necessary to address cognition/behavior during functional activity;For patient/therapist safety;To address functional/ADL transfers;Complexity of the patient's impairments (multi-system involvement) PT goals addressed during session: Mobility/safety with mobility;Balance;Proper use of DME;Strengthening/ROM OT goals addressed during session: Strengthening/ROM;ADL's and self-care;Proper use of Adaptive equipment and DME      AM-PAC OT "6 Clicks" Daily Activity     Outcome Measure   Help from another person eating meals?: A Lot Help from another person taking care of personal grooming?: A Lot Help from another person toileting, which includes using toliet, bedpan, or urinal?: Total Help from another person bathing (including washing, rinsing, drying)?: A Lot Help from another person to put on and taking off regular upper body clothing?: A Lot Help from another person to put on and taking off regular lower body clothing?: Total 6 Click Score: 10    End of Session Equipment Utilized During  Treatment: Gait belt;Other (comment)  OT Visit Diagnosis: Unsteadiness on feet (R26.81);Other abnormalities of gait and mobility (R26.89);Muscle weakness (generalized) (M62.81);History of falling (Z91.81);Other symptoms and signs involving cognitive function;Pain   Activity Tolerance Patient limited by fatigue   Patient Left in chair;with call bell/phone within reach;with chair alarm set;with nursing/sitter in room   Nurse Communication Need for lift equipment        Time: 1310-1340 OT Time Calculation (min): 30 min  Charges: OT General Charges $OT Visit: 1 Visit OT Treatments $Self Care/Home Management : 8-22 mins  Raynald KempKathryn Tudor Chandley, OT Acute Rehabilitation Services Pager: [phone] Office: [phone]    Pilar GrammesMathews, Ritvik Mczeal H 07/17/2018, 2:34 PM

## 2018-07-17 NOTE — Progress Notes (Signed)
Referring Physician(s): Goodrich,D  Supervising Physician: Oley Balm  Patient Status:  [facility] - In-pt  Chief Complaint: Left chest empyema   Subjective: Pt without acute changes; denies worsening CP, dyspnea   Allergies: Patient has no known allergies.  Medications: Prior to Admission medications   Medication Sig Start Date End Date Taking? Authorizing Provider  acetaminophen (TYLENOL) 325 MG tablet Take 2 tablets (650 mg total) by mouth every 6 (six) hours as needed for mild pain. 04/03/18  Yes Angiulli, Mcarthur Rossetti, PA-C  amLODipine (NORVASC) 5 MG tablet Take 5 mg by mouth daily.   Yes [provider]  apixaban (ELIQUIS) 5 MG TABS tablet Take 1 tablet (5 mg total) by mouth 2 (two) times daily. 04/03/18  Yes Angiulli, Mcarthur Rossetti, PA-C  bisacodyl (DULCOLAX) 10 MG suppository Place 1 suppository (10 mg total) rectally daily at 6 (six) AM. Patient taking differently: Place 10 mg rectally daily as needed for mild constipation.  04/03/18  Yes Angiulli, Mcarthur Rossetti, PA-C  carvedilol (COREG) 12.5 MG tablet Take 1 tablet (12.5 mg total) by mouth 2 (two) times daily with a meal. 04/03/18  Yes Angiulli, Mcarthur Rossetti, PA-C  ferrous sulfate 325 (65 FE) MG tablet Take 1 tablet (325 mg total) by mouth 2 (two) times daily with a meal. 05/31/18  Yes Calvert Cantor, MD  liver oil-zinc oxide (DESITIN) 40 % ointment Apply topically 4 (four) times daily. Apply to sacrum, coccyx, buttocks. Patient taking differently: Apply 1 application topically 4 (four) times daily. Apply to sacrum, coccyx, buttocks. 05/14/18  Yes Rai, Ripudeep K, MD  polyethylene glycol (MIRALAX / GLYCOLAX) packet Take 17 g by mouth daily as needed for mild constipation or moderate constipation. 05/31/18  Yes Rizwan, Ladell Heads, MD  senna-docusate (SENOKOT-S) 8.6-50 MG tablet Take 1 tablet by mouth 2 (two) times daily. Patient taking differently: Take 1 tablet by mouth 2 (two) times daily as needed (constipation).  05/14/18  Yes Rai,  Ripudeep K, MD  sodium phosphate (FLEET) 7-19 GM/118ML ENEM Place 133 mLs (1 enema total) rectally daily as needed for severe constipation. 05/31/18  Yes Calvert Cantor, MD  tamsulosin (FLOMAX) 0.4 MG CAPS capsule Take 1 capsule (0.4 mg total) by mouth daily. 03/06/18  Yes Leroy Sea, MD  vitamin C (VITAMIN C) 1000 MG tablet Take 1 tablet (1,000 mg total) by mouth 2 (two) times daily. 04/03/18  Yes Angiulli, Mcarthur Rossetti, PA-C  amLODipine (NORVASC) 10 MG tablet Take 1 tablet (10 mg total) by mouth daily. Patient not taking: Reported on 07/05/2018 03/06/18   Leroy Sea, MD  collagenase (SANTYL) ointment Apply topically daily. Cleanse gluteal wounds with NS.  Apply Santyl to open areas.  Cover with NS moist 2x2 and foam dressings.  Peel back foam and change daily. Foam is changed every three days and PRN soilage. Patient not taking: Reported on 07/05/2018 05/15/18   Rai, Delene Ruffini, MD  levofloxacin (LEVAQUIN) 750 MG tablet Take 750 mg by mouth daily. For 7 days. Start date 06/25/2018. End date 07/01/2018    [provider]  polyethylene glycol (MIRALAX / GLYCOLAX) packet Take 17 g by mouth 2 (two) times daily. Patient not taking: Reported on 07/05/2018 05/14/18   Cathren Harsh, MD     Vital Signs: BP 119/71 (BP Location: Right Arm)   Pulse 80   Temp 98.3 F (36.8 C)   Resp 16   Ht 5\' 9"  (1.753 m)   Wt 174 lb 6.1 oz (79.1 kg)   SpO2 95%  BMI 25.75 kg/m   Physical Exam left chest drain intact, dressing dry, site not sig tender, output 90 cc yellow fluid; no air leak  Imaging: Ct Chest Wo Contrast  Result Date: 07/16/2018 CLINICAL DATA:  Follow-up pleural effusion EXAM: CT CHEST WITHOUT CONTRAST TECHNIQUE: Multidetector CT imaging of the chest was performed following the standard protocol without IV contrast. COMPARISON:  07/06/2018 FINDINGS: Cardiovascular: Normal heart size. No pericardial effusion. Coronary atherosclerotic calcification. Mediastinum/Nodes: Calcified  mediastinal and left hilar lymph nodes attributed to old granulomatous disease. Lungs/Pleura: History of empyema. Pleural drain on the left with significant decrease in pleural fluid. There is still patchy loculated pleural disease seen medially and laterally at the apex and primarily laterally at the base. The largest pocket is at the lateral costophrenic sulcus and measures approximately 10 x 5 cm. Multifocal left lung atelectasis or consolidation. Mild atelectasis and small dependent effusion on the right. Upper Abdomen: No acute finding Musculoskeletal: T10-11 discitis osteomyelitis, known. Erosion has progressed by CT since 10 days ago. Spondylosis and multilevel thoracic ankylosis. IMPRESSION: Left empyema with significant decrease in fluid after chest tube placement. There is still moderate patchy loculated pleural fluid on the left with extensive left-sided atelectasis or consolidation. Electronically Signed   By: Marnee Spring M.D.   On: 07/16/2018 08:18    Labs:  CBC: Recent Labs    07/13/18 0219 07/14/18 0301 07/15/18 0250 07/16/18 0247  WBC 15.9* 10.2 9.6 9.3  HGB 8.9* 8.4* 8.7* 7.7*  HCT 28.8* 27.1* 28.7* 24.8*  PLT 525* 434* 463* 391    COAGS: Recent Labs    02/25/18 1413  05/10/18 0642 05/11/18 0615 05/12/18 0628 05/13/18 0503 07/05/18 1918 07/11/18 1113  INR 1.16  --   --   --   --   --  1.89 1.34  APTT  --    < > 54* 53* 51* 54*  --   --    < > = values in this interval not displayed.    BMP: Recent Labs    07/13/18 0219 07/14/18 0301 07/15/18 0250 07/17/18 0323  NA 134* 135 135 136  K 3.7 3.2* 3.6 3.0*  CL 100 104 105 109  CO2 23 21* 21* 20*  GLUCOSE 80 84 86 95  BUN 7* 8 7* 5*  CALCIUM 8.1* 7.6* 7.7* 7.6*  CREATININE 0.74 0.65 0.63 0.65  GFRNONAA >60 >60 >60 >60  GFRAA >60 >60 >60 >60    LIVER FUNCTION TESTS: Recent Labs    05/17/18 0659 07/05/18 1918 07/07/18 0835 07/09/18 1725  BILITOT 0.5 0.7 0.4 0.5  AST 20 26 19 17   ALT 16 11 9 8     ALKPHOS 78 96 97 96  PROT 6.2* 6.3* 5.5* 5.8*  ALBUMIN 2.0* 1.8* 1.4* 1.5*    Assessment and Plan: Pt with hx MRSA PNA, left chest empyema; s/p left chest drain 07/08/18; afebrile; creat nl; pleural fl cx neg; K 3.0- replace; CT chest yesterday with sig decrease in size of drained fluid coll but mod patchy loculated fluid persists; for additional tPA/DNAse via tube per CCM.    Electronically Signed: D. Jeananne Rama, PA-C 07/17/2018, 9:19 AM   I spent a total of 15 minutes at the the patient's bedside AND on the patient's hospital floor or unit, greater than 50% of which was counseling/coordinating care for left chest drain    Patient ID: Wayne Buckley, male   DOB: 10-14-1950, 68 y.o.   MRN: [id number]

## 2018-07-17 NOTE — Progress Notes (Signed)
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  Was subsequently found to have recurrent MRSA bacteremia with left-sided pneumonia, loculated parapneumonic effusion, thoracic and lumbar spine discitis.  He underwent CT-guided chest tube placement and is followed by pulmonology and interventional radiology.  Given abnormal MRI findings, he has been followed by neurosurgery.  Neurosurgery recommended long-term IV antibiotics, no invasive intervention.  Pulmonology following chest tube and loculated pleural effusion.  Should be able to go to SNF when chest tube has been removed.  Continue long-term IV antibiotics.  A & P  Recurrent MRSA bacteremia with left-sided pneumonia with loculated left effusion/empyema, acute hypoxic respiratory failure 1/24.  Status post lytics 1/24. --Respiratory status remained stable.  Follow-up chest CT showed improvement but still significant loculated fluid. -Appreciate CCM management with scheduled TPA every 12 hours for now.  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  Not a candidate for TEE in the past secondary to possible esophageal obstruction. Repeat blood cultures no growth, final --Continue prolonged IV antibiotics as recommended by infectious disease, 8-10 weeks with repeat imaging in 6-8 weeks. Tunnel PICC given involuntary movements in arms planned in near future (was canceled 1/24 given patient instability). --Neurosurgery recommended medical management and there are no plans for surgical intervention at this point as of the evaluation 1/24.  PMH traumatic brain injury, PMH C5-C7 spinal cord  injury, chronic involuntary movements of the extremities, chronic urinary retention with chronic indwelling Foley catheter  Essential hypertension --Remains stable, continue carvedilol  Iron deficiency anemia.  Continue iron supplementation.  Status post IV iron.  Status post transfusion 3 units PRBC. --Hemoglobin stable.  PMH DVT --Will continue therapeutic Lovenox for now.  Unstageable pressure injury of the bilateral buttocks present on admission --Continue wound care   PMH TBI, cord compression.  Resident in SNF.  Chronic Foley for neurogenic bladder.   Continue IV antibiotic.  Follow-up pulmonology recommendations.  Will remain hospitalized until able to remove chest tube.  DVT prophylaxis: enoxaparin Code Status: Full Family Communication: Disposition Plan: SNF   Consultants  . ID . Neurosurgery  . IR . Pulmonology  Procedures  . Diagnostic left thoracentesis . CT-guided left chest tube placement 1/20 . MRI under anesthesia . Echo Study Conclusions  - Left ventricle: The cavity size was normal. Systolic function was   normal. The estimated ejection fraction was in the range of 60%   to 65%. Wall motion was normal; there were no regional wall   motion abnormalities. Doppler parameters are consistent with   abnormal left ventricular relaxation (grade 1 diastolic   dysfunction). - Ventricular septum: Septal motion showed &quot;bounce&quot;. - Aortic valve: Transvalvular velocity was within the normal range.   There was no stenosis. There was no regurgitation. - Mitral valve: Transvalvular velocity was within the normal range.   There was no evidence for stenosis. There was no regurgitation. - Right ventricle: The cavity size was normal. Wall thickness was   normal. Systolic function was normal. - Atrial septum: No defect or patent foramen ovale was identified   by color flow Doppler. - Tricuspid valve: There was no regurgitation.  Antibiotics  .   Interval  History/Subjective  Feels fine today, breathing okay, poor appetite.  Not eating much.  Objective   Vitals:  Vitals:   07/16/18 2307 07/17/18 0805  BP: 117/73 119/71  Pulse: 88 80  Resp: 16   Temp: 98 F (36.7 C) 98.3 F (36.8 C)  SpO2: 93% 95%    Exam: Constitutional:   . Appears calm and comfortable Respiratory:  . CTA bilaterally, no w/r/r.  . Respiratory effort normal.  Cardiovascular:  . RRR, no m/r/g . 2+ bilateral LE extremity edema   Musculoskeletal:  . RUE, LUE, RLE, LLE   . Involuntary movements arms and legs noted Neurologic:  . No apparent change Psychiatric:  . Mental status o Mood, affect appropriate  I have personally reviewed the following:   Today's Data  . Hemoglobin remained stable at 9.0.  Potassium 3.5.  Magnesium 1.7. Marland Kitchen. Chest tube output 260 cc.  Lab Data  .   Micro Data  .   Imaging   CT chest showed large loculated left pleural effusion with upper and lower lobe consolidation.  Multiple MRIs noted  Cardiology Data  .   Other Data  .   Scheduled Meds: . alteplase (TPA) for intrapleural administration  10 mg Intrapleural Q12H   And  . pulmozyme (DORNASE) for intrapleural administration  5 mg Intrapleural Q12H  . carvedilol  6.25 mg Oral BID WC  . collagenase   Topical Daily  . enoxaparin (LOVENOX) injection  80 mg Subcutaneous BID  . ferrous sulfate  325 mg Oral BID WC  . polyethylene glycol  17 g Oral BID  . senna  1 tablet Oral QHS  . tamsulosin  0.4 mg Oral Daily   Continuous Infusions: . lactated ringers 10 mL/hr at 07/16/18 0445  . vancomycin 1,000 mg (07/17/18 0746)    Principal Problem:   MRSA bacteremia Active Problems:   Urinary retention   C5-C7 level spinal cord injury (HCC)   Essential hypertension   Chronic anemia   Traumatic brain injury (HCC)   Pleural effusion on left   Vertebral osteomyelitis (HCC)   Pressure injury of skin   Acute hypoxemic respiratory failure (HCC)   Atelectasis   Empyema  (HCC)   LOS: 11 days      Author:  Lynden OxfordPranav Gracelin Weisberg, MD [facility] 07/17/2018  If 7PM-7AM, please contact night-coverage To reach On-call, see [email or web address]

## 2018-07-17 NOTE — Progress Notes (Signed)
PCCM   TPA, Pulmozyme administered into chest tube with sterile technique.  Chest tube clamped.  RN to reposition patient x 3 over the next hour and then open chest tube to drain at 2130   Joneen Roach, AGACNP-BC [facility] Pulmonary/Critical Care Pager [phone] or [phone]  07/17/2018 8:37 PM

## 2018-07-17 NOTE — Progress Notes (Signed)
TPA, Pulmozyme administered into chest tube with sterile technique.  Chest tube clamped.  Reviewed with RN to open chest tube in approximately one hour to drain.    Canary BrimBrandi Tamina Cyphers, NP-C [place] [facility] Pgr: [phone] or if no answer [phone] 07/17/2018, 10:29 AM

## 2018-07-17 NOTE — Progress Notes (Addendum)
NAME:  Wayne Buckley, MRN:  [id number], DOB:  1950/11/23, LOS: 11 ADMISSION DATE:  07/05/2018, CONSULTATION DATE:  1/19 REFERRING MD:  Dr. Lynden Oxford [facility]), CHIEF COMPLAINT:  Pleural Effusion   Brief History   68 yo M, admitted 1/17, recent admission for MRSA bacteremia & UTI in setting of chronic foley sent to [facility] ED from rehab facility for acute on chronic anemia requiring transfusion.  ECHO during prior admit showed no evidence of vegetation (unable to perform TEE). He was discharged to SNF on 1/13.    Found to have left sided pneumonia with parapneumonic effusion. Blood cultures on admission grew MRSA bacteremia. Underwent US guided thoracentesis 1/18 that yielded 90 cc of fluid. Unable to aspiration additional fluid due to loculations. CT Chest with large loculated effusion and evidence of T11-T12 and T12-L1 diskitis / osteomyelitis.   Past Medical History  HTN, chronic anemia requiring recurrent transfusion (no clinical bleeding), history of TBI with neurogenic bladder, chronic indwelling foley, and DVT  Significant Hospital Events   1/17 Re admit from SNF (recent discharge for MRSA bacteremia) 1/24 Intra pleural tPA, pulmozyme, 100% NRB 1/25 CT 940 cc out 24 hours, another 200 cc out since 7 this morning 1/27 Off O2  Consults:  PCCM  ID IR  Procedures:  1/18 US guided thoracentesis >> LDH 146, Protein 3.9, WBC 1,755 1/21 Left pigtail chest tube drain placement per IR  Significant Diagnostic Tests:  1/18 CTA Chest >> negative for PE. 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 Posterior subdural fluid collection extending from T6 through T10 compatible with abscess. 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  Discitis and osteomyelitis at   T10-11, T11-T12, T12-L1, L1-2,L4-5 . Per Neurosurgery, does not need surgery now.  Loculated left effusion, concern for infection.   CT Chest w/o 1/28 >> left empyema with significant decrease in fluid after chest tube placement.  Still moderate patchy loculated pleural fluid on the left with extensive left sided atelectasis or consolidation  Micro Data:  1/17 Blood cx >> MRSA 1/17 Urine cx >> Negative 1/18 Thoracentesis fluid >> negative  1/18 Blood Culture >> negative  Pleural fluid 1/22 >>   Antimicrobials:  1/17 Cefepime x 1 1/17 Metronidazole x 1 1/17 Vancomycin >>   Interim history/subjective:  No acute events.  Pt states he wants to sleep.    Objective   Blood pressure 119/71, pulse 80, temperature 98.3 F (36.8 C), resp. rate 16, height 5\' 9"  (1.753 m), weight 79.1 kg, SpO2 95 %.        Intake/Output Summary (Last 24 hours) at 07/17/2018 0846 Last data filed at 07/17/2018 0751 Gross per 24 hour  Intake -  Output 1265 ml  Net -1265 ml   Filed Weights   07/05/18 1900 07/06/18 0139  Weight: 114.8 kg 79.1 kg    Examination: General: chronically ill appearing male lying in bed in NAD HEENT: MM pink/moist Neuro: AAOx4, speech slow but appropriate (hx TBI) CV: s1s2 rrr, no m/r/g PULM: even/non-labored, clear on right, diminished on left, L chest tube with yellow drainage UU:VOZD, non-tender, bsx4 active  Extremities: warm/dry, trace BLE edema  Skin: no rashes or lesions  Resolved Hospital Problem list     Assessment & Plan:   Left Upper and Lower Lobe PNA with  loculated pleural effusion -Thora 1/18 with 90 ml fluid / unable to completely drain due to loculation -IR CT placed 1/21 with 650 ml drained P: Orders placed for Q12 tPA, DNase Follow intermittent CXR  ABX per ID / Primary  Will trial repeat TPA in succession, if does not clear will need TCTS eval  MRSA Bacteremia T11-T12, T12-L1 Diskitis / Osteomyelitis Epidural Abscess  -unable to perform TEE  due to cervical hardware P: ID, NSGY following Vancomycin per ID/Primary  Will need repeat spine imaging in ~ 6 weeks   Best practice:  Diet: heart healthy  Pain/Anxiety/Delirium protocol (if indicated): N/A VAP protocol (if indicated): N/A DVT prophylaxis: Eliquis GI prophylaxis: N/A Glucose control: monitor Mobility: as toleraged Code Status: FULL Family Communication: Wife updated via phone 1/29 on plan for tPA/dornase Disposition: TRH.  PCCM will continue to follow.   Labs   CBC: Recent Labs  Lab 07/12/18 0332 07/13/18 0219 07/14/18 0301 07/15/18 0250 07/16/18 0247  WBC 12.4* 15.9* 10.2 9.6 9.3  HGB 8.7* 8.9* 8.4* 8.7* 7.7*  HCT 28.3* 28.8* 27.1* 28.7* 24.8*  MCV 86.0 87.0 88.0 87.8 87.3  PLT 496* 525* 434* 463* 391    Basic Metabolic Panel: Recent Labs  Lab 07/12/18 0332 07/13/18 0219 07/14/18 0301 07/15/18 0250 07/17/18 0323  NA 133* 134* 135 135 136  K 3.6 3.7 3.2* 3.6 3.0*  CL 104 100 104 105 109  CO2 24 23 21* 21* 20*  GLUCOSE 81 80 84 86 95  BUN 6* 7* 8 7* 5*  CREATININE 0.61 0.74 0.65 0.63 0.65  CALCIUM 7.9* 8.1* 7.6* 7.7* 7.6*   GFR: Estimated Creatinine Clearance: 88.4 mL/min (by C-G formula based on SCr of 0.65 mg/dL). Recent Labs  Lab 07/13/18 0219 07/14/18 0301 07/15/18 0250 07/16/18 0247  WBC 15.9* 10.2 9.6 9.3    Liver Function Tests: No results for input(s): AST, ALT, ALKPHOS, BILITOT, PROT, ALBUMIN in the last 168 hours. No results for input(s): LIPASE, AMYLASE in the last 168 hours. No results for input(s): AMMONIA in the last 168 hours.  ABG    Component Value Date/Time   PHART 7.429 04/29/2012 0453   PCO2ART 33.0 (L) 04/29/2012 0453   PO2ART 138.0 (H) 04/29/2012 0453   HCO3 21.5 04/29/2012 0453   TCO2 22.5 04/29/2012 0453   ACIDBASEDEF 2.2 (H) 04/29/2012 0453   O2SAT 99.3 04/29/2012 0453     Coagulation Profile: Recent Labs  Lab 07/11/18 1113  INR 1.34    Cardiac Enzymes: No results for input(s): CKTOTAL,  CKMB, CKMBINDEX, TROPONINI in the last 168 hours.  HbA1C: No results found for: HGBA1C  CBG: No results for input(s): GLUCAP in the last 168 hours.  Canary BrimBrandi Ollis, NP-C [place] [facility] Pgr: [phone] or if no answer [phone] 07/17/2018, 8:46 AM  Attending Note:  68 year old male with MRSA empyema with loculation.  No events overnight.  No new complaints.  On exam, patient is alert and interactive with diffuse BS diffusely.  I reviewed chest CT myself, loculations improved but present.  Discussed with PCCM-NP.  MRSA pneumonia:  - Abx as ordered  Loculated pleural effusion:  - tPA/DNase to be instilled by PCCM today q12 x2 doses as last attempt  - If no resolution then will need CVTS but doubt is a surgical candidate  Empyema:  - Vancomycin IV per ID  Hypoxemia:  - Titrate O2 for sat of 88-92%  - Will need an ambulatory desat study prior to discharge for home O2  need  PCCM will continue to follow  Patient seen and examined, agree with above note.  I dictated the care and orders written for this patient under my direction.  Alyson Reedy, M.D. [facility] Pulmonary/Critical Care Medicine. Pager: [phone]. After hours pager: [phone].

## 2018-07-17 NOTE — Progress Notes (Signed)
Physical Therapy Treatment Patient Details Name: Wayne Buckley MRN: [id number] DOB: 02/07/1951 Today's Date: 07/17/2018    History of Present Illness 68 y.o. male admitted 07/05/18 from SNF rehab with symptomatic anemia and MRSA bacteremia empyema.  Pt found to have L sided PNA with effusion requiring US guided thoracentesis (07/06/18) and pigtail chest tube placement in IR (07/09/18).  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     PT Comments    Patient more participatory in therapy today, max A to come to sitting. Unable despite several attempts to stand in [place], with poor use of BUE hands to assist. Benefit from further cues to focus on UE support on Stedy. Cont to rec SNF.    Follow Up Recommendations  SNF     Equipment Recommendations  Hospital bed;Other (comment)(hoyer lift)    Recommendations for Other Services Rehab consult     Precautions / Restrictions Precautions Precautions: Fall Precaution Comments: TBI and old SCI, very weak in his legs Restrictions Weight Bearing Restrictions: No    Mobility  Bed Mobility Overal bed mobility: Needs Assistance Bed Mobility: Supine to Sit Rolling: Max assist   Supine to sit: Max assist;+2 for physical assistance;HOB elevated Sit to supine: Max assist;+2 for physical assistance;HOB elevated   General bed mobility comments: Two person max assist to help progress bil LEs to EOB and support trunk to come up to sitting.  Pt initially with posterior push which improved once setteled EOB.   Transfers Overall transfer level: Needs assistance   Transfers: Sit to/from Stand;Stand Pivot Transfers Sit to Stand: +2 physical assistance;Max assist;From elevated surface         General transfer comment: unable to succesfully use stedy this visit, patient with cues for use of hands, however kept lunging over the bar of Stedy  and not using BUE support. several attempts with +2 therapists max A  Ambulation/Gait                 Stairs             Wheelchair Mobility    Modified Rankin (Stroke Patients Only)       Balance Overall balance assessment: Needs assistance Sitting-balance support: Feet supported;Bilateral upper extremity supported Sitting balance-[name] Scale: Poor Sitting balance - Comments: need mod assist EOB with bil hand prop.  Generally posterior lean and some treamoring once upright.  Postural control: Left lateral lean Standing balance support: Bilateral upper extremity supported Standing balance-[name] Scale: Zero                              Cognition Arousal/Alertness: Awake/alert Behavior During Therapy: Restless;Agitated;Flat affect Overall Cognitive Status: History of cognitive impairments - at baseline                                 General Comments: at baseline per wife. patient AO to person place, not situation or time. patient agreeable to therapy with wife's encouragement. follows one step commands inconsistently/with extended time;agitated during therapy session, reluctantly agreed when encouraged by wife      Exercises General Exercises - Lower Extremity Ankle Circles/Pumps: 20 reps Long Arc Quad: AAROM;10 reps    General Comments        Pertinent Vitals/Pain Pain Assessment: Faces Faces Pain Scale: Hurts even more Pain  Location: "all over", unable to specify  Pain Descriptors / Indicators: Grimacing;Moaning Pain Intervention(s): Limited activity within patient's tolerance;Monitored during session;Premedicated before session    Home Living                      Prior Function            PT Goals (current goals can now be found in the care plan section) Acute Rehab PT Goals Patient Stated Goal: to get stronger PT Goal Formulation: With patient Time For Goal Achievement: 07/24/18 Potential to Achieve Goals:  Fair Progress towards PT goals: Progressing toward goals    Frequency    Min 2X/week      PT Plan Frequency needs to be updated    Co-evaluation PT/OT/SLP Co-Evaluation/Treatment: Yes Reason for Co-Treatment: For patient/therapist safety;To address functional/ADL transfers;Complexity of the patient's impairments (multi-system involvement) PT goals addressed during session: Mobility/safety with mobility;Balance;Proper use of DME;Strengthening/ROM        AM-PAC PT "6 Clicks" Mobility   Outcome Measure  Help needed turning from your back to your side while in a flat bed without using bedrails?: Total Help needed moving from lying on your back to sitting on the side of a flat bed without using bedrails?: Total Help needed moving to and from a bed to a chair (including a wheelchair)?: Total Help needed standing up from a chair using your arms (e.g., wheelchair or bedside chair)?: Total Help needed to walk in hospital room?: Total Help needed climbing 3-5 steps with a railing? : Total 6 Click Score: 6    End of Session Equipment Utilized During Treatment: Gait belt Activity Tolerance: Patient limited by fatigue Patient left: in chair;with call bell/phone within reach;with chair alarm set;with family/visitor present Nurse Communication: Mobility status;Need for lift equipment PT Visit Diagnosis: Muscle weakness (generalized) (M62.81);Unsteadiness on feet (R26.81);Other abnormalities of gait and mobility (R26.89);Other symptoms and signs involving the nervous system (R29.898)     Time: 1310-1340 PT Time Calculation (min) (ACUTE ONLY): 30 min  Charges:  $Therapeutic Activity: 8-22 mins                     Etta Grandchild, PT, DPT Acute Rehabilitation Services Pager: [phone] Office: [phone]     Etta Grandchild 07/17/2018, 1:52 PM

## 2018-07-18 DIAGNOSIS — J90 Pleural effusion, not elsewhere classified: Secondary | ICD-10-CM

## 2018-07-18 DIAGNOSIS — J9601 Acute respiratory failure with hypoxia: Secondary | ICD-10-CM

## 2018-07-18 LAB — CBC WITH DIFFERENTIAL/PLATELET
Abs Immature Granulocytes: 0.04 10*3/uL (ref 0.00–0.07)
BASOS ABS: 0 10*3/uL (ref 0.0–0.1)
Basophils Relative: 0 %
Eosinophils Absolute: 0.2 10*3/uL (ref 0.0–0.5)
Eosinophils Relative: 2 %
HCT: 24 % — ABNORMAL LOW (ref 39.0–52.0)
Hemoglobin: 7.5 g/dL — ABNORMAL LOW (ref 13.0–17.0)
Immature Granulocytes: 1 %
LYMPHS ABS: 1.3 10*3/uL (ref 0.7–4.0)
Lymphocytes Relative: 18 %
MCH: 27.4 pg (ref 26.0–34.0)
MCHC: 31.3 g/dL (ref 30.0–36.0)
MCV: 87.6 fL (ref 80.0–100.0)
Monocytes Absolute: 0.6 10*3/uL (ref 0.1–1.0)
Monocytes Relative: 8 %
NRBC: 0 % (ref 0.0–0.2)
Neutro Abs: 5.3 10*3/uL (ref 1.7–7.7)
Neutrophils Relative %: 71 %
Platelets: 408 10*3/uL — ABNORMAL HIGH (ref 150–400)
RBC: 2.74 MIL/uL — ABNORMAL LOW (ref 4.22–5.81)
RDW: 16.3 % — ABNORMAL HIGH (ref 11.5–15.5)
WBC: 7.4 10*3/uL (ref 4.0–10.5)

## 2018-07-18 LAB — CBC
HEMATOCRIT: 25.5 % — AB (ref 39.0–52.0)
Hemoglobin: 7.9 g/dL — ABNORMAL LOW (ref 13.0–17.0)
MCH: 27.2 pg (ref 26.0–34.0)
MCHC: 31 g/dL (ref 30.0–36.0)
MCV: 87.9 fL (ref 80.0–100.0)
Platelets: 433 10*3/uL — ABNORMAL HIGH (ref 150–400)
RBC: 2.9 MIL/uL — ABNORMAL LOW (ref 4.22–5.81)
RDW: 16.2 % — AB (ref 11.5–15.5)
WBC: 8.9 10*3/uL (ref 4.0–10.5)
nRBC: 0 % (ref 0.0–0.2)

## 2018-07-18 LAB — BASIC METABOLIC PANEL
Anion gap: 6 (ref 5–15)
BUN: 5 mg/dL — ABNORMAL LOW (ref 8–23)
CO2: 21 mmol/L — ABNORMAL LOW (ref 22–32)
Calcium: 7.5 mg/dL — ABNORMAL LOW (ref 8.9–10.3)
Chloride: 111 mmol/L (ref 98–111)
Creatinine, Ser: 0.69 mg/dL (ref 0.61–1.24)
GFR calc Af Amer: >60 mL/min (ref >60)
GFR calc non Af Amer: >60 mL/min (ref >60)
GLUCOSE: 101 mg/dL — AB (ref 70–99)
Potassium: 3.8 mmol/L (ref 3.5–5.1)
Sodium: 138 mmol/L (ref 135–145)

## 2018-07-18 LAB — VANCOMYCIN, PEAK: VANCOMYCIN PK: 27 ug/mL — AB (ref 30–40)

## 2018-07-18 LAB — FIBRINOGEN: Fibrinogen: 464 mg/dL (ref 210–475)

## 2018-07-18 LAB — VANCOMYCIN, TROUGH: Vancomycin Tr: 14 ug/mL — ABNORMAL LOW (ref 15–20)

## 2018-07-18 LAB — TECHNOLOGIST SMEAR REVIEW

## 2018-07-18 LAB — MAGNESIUM: Magnesium: 1.6 mg/dL — ABNORMAL LOW (ref 1.7–2.4)

## 2018-07-18 LAB — PROTIME-INR
INR: 1.34
Prothrombin Time: 16.4 seconds — ABNORMAL HIGH (ref 11.4–15.2)

## 2018-07-18 MED ORDER — MAGNESIUM SULFATE 2 GM/50ML IV SOLN
2.0000 g | Freq: Once | INTRAVENOUS | Status: AC
  Administered 2018-07-18: 2 g via INTRAVENOUS
  Filled 2018-07-18: qty 50

## 2018-07-18 NOTE — Progress Notes (Addendum)
Pharmacy Antibiotic Note  KAELEM GUIJARRO is a 68 y.o. male admitted on 07/05/2018 with Thoracolumbar osteomyelitis, Empyema, Bacteremia - MRSA.  Pharmacy consulted 07/05/18 for vancomycin dosing.  Afebrile, Tmax 99.4, WBC wnl. Scr wnl stable, CrCl 88 ml/min.  Planning at least 6 weeks of therapy per ID  , end date is 3/28. Today Vanc peak = 27 mcg/ml and vanc trough = 14 mcg/ml on 1000 mg q24h. Vancomycin 1000 mg IV Q 24 hrs. Goal AUC 400-550. Calculated AUC: 474.3 AUC is within goal 400-500   ID noted  needs 8-10 weeks of IV therapy followed by protracted oral therapy for at least 6 up to 12 months. Will need repeat imaging at 6- 8 week mark to reassess considering severity of disease.    Plan: Continue Vancomycin 1000 mg IV every 24 hours. (projected new AUC 474) Stop date for Vanc is 3/28 (10 weeks total) Monitor Vancomycin levels weekly every Thursday. Monitor clinical progress, cultures/sensitivities, renal function.  Height: 5\' 9"  (175.3 cm) Weight: 174 lb 6.1 oz (79.1 kg) IBW/kg (Calculated) : 70.7  Temp (24hrs), Avg:98.8 F (37.1 C), Min:98.2 F (36.8 C), Max:99.4 F (37.4 C)  Recent Labs  Lab 07/14/18 0301 07/15/18 0250 07/16/18 0247 07/17/18 0323 07/17/18 1155 07/18/18 0322 07/18/18 0614 07/18/18 1048  WBC 10.2 9.6 9.3  --  9.0 8.9  --  7.4  CREATININE 0.65 0.63  --  0.65 0.63 0.69  --   --   VANCOTROUGH  --   --   --   --   --   --  14*  --   VANCOPEAK  --   --   --   --   --   --   --  27*    Estimated Creatinine Clearance: 88.4 mL/min (by C-G formula based on SCr of 0.69 mg/dL).    No Known Allergies   Antibiotics: 1/18 Vanc >> 1/17 Cefepime>>1/17  Dose adjustments:  -1/22:   VP on 1/22 was 41, VP on 1/23 was 16 (AUC of 662 on 750mg  Q12h; dose decreased) - 1/30:  VP = 27, VT = 14 (AUC 474.3 on 1000 mg q24h)    Microbiology:  1/17 urine>>neg 1/17 blood>>MRSA 1/18 MRSA>>positive 1/18 BCID>>MRSA 1/18 blood>>negative 1/18 throacentesis fluid:  negative 1/22: pleural fluid: negative   Thank you for allowing pharmacy to be part of this patients care team.  Noah Delaine, RPh Clinical Pharmacist [phone] Please utilize Amion for appropriate phone number to reach the unit pharmacist [facility]) 07/18/2018 12:27 PM

## 2018-07-18 NOTE — Progress Notes (Signed)
Intrapleural tPA, Pulmozyme administered via chest tube with sterile technique.  Chest tube clamped.  RN instructed to reposition the patient over the next hour and then unclamp to drain.     Canary Brim, NP-C [place] [facility] Pgr: [phone] or if no answer [phone] 07/18/2018, 9:05 AM

## 2018-07-18 NOTE — Progress Notes (Signed)
Pulmozyme instilled intrapleural via chest tube.  Sterile technique utilized.  Chest tube clamped.  RN instructed to open chest tube for drainage after one hour.    Canary Brim, NP-C [place] [facility] Pgr: [phone] or if no answer [phone] 07/18/2018, 11:40 AM

## 2018-07-18 NOTE — Progress Notes (Signed)
Patient ID: Wayne Buckley, male   DOB: 07-17-1950, 68 y.o.   MRN: [id number] Clinical status essentially unchanged.  Patient moves lower extremities with modest cooperation however he is not been ambulatory.  Denies any significant back pain at this time.  Surgical intervention not advised at this time but I would suggest reimaging after course of antibiotics is completed.

## 2018-07-18 NOTE — Progress Notes (Signed)
ANTICOAGULATION CONSULT NOTE - follow up Pharmacy Consult for Lovenox  Indication: DVT  No Known Allergies  Patient Measurements: Height: 5\' 9"  (175.3 cm) Weight: 174 lb 6.1 oz (79.1 kg) IBW/kg (Calculated) : 70.7  Vital Signs: Temp: 98.2 F (36.8 C) (01/30 0815) Temp Source: Oral (01/30 0815) BP: 124/66 (01/30 0815) Pulse Rate: 79 (01/30 0815)  Labs: Recent Labs    07/17/18 0323 07/17/18 1155 07/18/18 0322 07/18/18 1048  HGB  --  9.0* 7.9* 7.5*  HCT  --  30.3* 25.5* 24.0*  PLT  --  425* 433* 408*  LABPROT  --   --   --  16.4*  INR  --   --   --  1.34  CREATININE 0.65 0.63 0.69  --     Estimated Creatinine Clearance: 88.4 mL/min (by C-G formula based on SCr of 0.69 mg/dL).   Medical History: Past Medical History:  Diagnosis Date  . BPH (benign prostatic hyperplasia)   . DVT (deep venous thrombosis) (HCC) 02/2018   extensive LLE DVT  . H/O alcohol abuse   . Hypertension   . Neurogenic bowel   . Spinal cord injury, cervical region (HCC) 02/2018  . TBI (traumatic brain injury) (HCC) 2013   SDH with left partial frontal lobectomy    Medications:  Medications Prior to Admission  Medication Sig Dispense Refill Last Dose  . acetaminophen (TYLENOL) 325 MG tablet Take 2 tablets (650 mg total) by mouth every 6 (six) hours as needed for mild pain.   Past Month at Unknown time  . amLODipine (NORVASC) 5 MG tablet Take 5 mg by mouth daily.   07/05/2018 at    . apixaban (ELIQUIS) 5 MG TABS tablet Take 1 tablet (5 mg total) by mouth 2 (two) times daily. 60 tablet  07/05/2018 at 0935  . bisacodyl (DULCOLAX) 10 MG suppository Place 1 suppository (10 mg total) rectally daily at 6 (six) AM. (Patient taking differently: Place 10 mg rectally daily as needed for mild constipation. ) 12 suppository 0 07/04/2018 at Unknown time  . carvedilol (COREG) 12.5 MG tablet Take 1 tablet (12.5 mg total) by mouth 2 (two) times daily with a meal.   07/05/2018 at 0935  . ferrous sulfate 325 (65 FE)  MG tablet Take 1 tablet (325 mg total) by mouth 2 (two) times daily with a meal.  3 07/05/2018 at Unknown time  . liver oil-zinc oxide (DESITIN) 40 % ointment Apply topically 4 (four) times daily. Apply to sacrum, coccyx, buttocks. (Patient taking differently: Apply 1 application topically 4 (four) times daily. Apply to sacrum, coccyx, buttocks.) 56.7 g 0 07/05/2018 at Unknown time  . polyethylene glycol (MIRALAX / GLYCOLAX) packet Take 17 g by mouth daily as needed for mild constipation or moderate constipation. 14 each 0 07/04/2018 at Unknown time  . senna-docusate (SENOKOT-S) 8.6-50 MG tablet Take 1 tablet by mouth 2 (two) times daily. (Patient taking differently: Take 1 tablet by mouth 2 (two) times daily as needed (constipation). )   07/04/2018 at Unknown time  . sodium phosphate (FLEET) 7-19 GM/118ML ENEM Place 133 mLs (1 enema total) rectally daily as needed for severe constipation.  0 unk  . tamsulosin (FLOMAX) 0.4 MG CAPS capsule Take 1 capsule (0.4 mg total) by mouth daily. 30 capsule  07/04/2018 at Unknown time  . vitamin C (VITAMIN C) 1000 MG tablet Take 1 tablet (1,000 mg total) by mouth 2 (two) times daily.   07/05/2018 at Unknown time  . amLODipine (NORVASC) 10 MG tablet  Take 1 tablet (10 mg total) by mouth daily. (Patient not taking: Reported on 07/05/2018)   Not Taking at Unknown time  . collagenase (SANTYL) ointment Apply topically daily. Cleanse gluteal wounds with NS.  Apply Santyl to open areas.  Cover with NS moist 2x2 and foam dressings.  Peel back foam and change daily. Foam is changed every three days and PRN soilage. (Patient not taking: Reported on 07/05/2018) 15 g 0 Not Taking at Unknown time  . levofloxacin (LEVAQUIN) 750 MG tablet Take 750 mg by mouth daily. For 7 days. Start date 06/25/2018. End date 07/01/2018   07/01/2018  . polyethylene glycol (MIRALAX / GLYCOLAX) packet Take 17 g by mouth 2 (two) times daily. 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 He is s/p drain placement and prolonged IV antibiotics.   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 He received a few doses upon admission but anticoagulation has been held since 1/19 for placement of CT guided drain. Pharmacy consulted to start Lovenox. LMWH to continue until after tunneled catheter per MD note.   SCr wnl, CrCl ~ 88 mL/min.   Hgb 7.2>>PRBCs given 1/17, 1/18 Hgb 7.7>9.0>7.9>7.5,  Plts wnl to high   Goal of Therapy:  Anti-Xa level 0.6-1 units/ml 4hrs after LMWH dose given Monitor platelets by anticoagulation protocol: Yes   Plan:  -Continue Lovenox 80 mg (1 mg/kg) SQ q12 hours. -Monitor renal function and f/u catheter plans -Monitor for bleeding   Noah Delaine, RPh Clinical Pharmacist Please check AMION for all [facility] phone numbers After 10:00 PM, call Main Pharmacy [phone] Please utilize Amion for appropriate phone number to reach the unit pharmacist [facility]) 07/18/2018 12:36 PM

## 2018-07-18 NOTE — Progress Notes (Addendum)
NAME:  Wayne Buckley, MRN:  [id number], DOB:  11/20/1950, LOS: 12 ADMISSION DATE:  07/05/2018, CONSULTATION DATE:  1/19 REFERRING MD:  Dr. Lynden OxfordPranav Patel [facility]), CHIEF COMPLAINT:  Pleural Effusion   Brief History   68 yo M, admitted 1/17, recent admission for MRSA bacteremia & UTI in setting of chronic foley sent [facility] ED from rehab facility for acute on chronic anemia requiring transfusion.  ECHO during prior admit showed no evidence of vegetation (unable to perform TEE). He was discharged to SNF on 1/13.    Found to have left sided pneumonia with parapneumonic effusion. Blood cultures on admission grew MRSA bacteremia. Underwent US guided thoracentesis 1/18 that yielded 90 cc of fluid. Unable to aspiration additional fluid due to loculations. CT Chest with large loculated effusion and evidence of T11-T12 and T12-L1 diskitis / osteomyelitis.   Past Medical History  HTN, chronic anemia requiring recurrent transfusion (no clinical bleeding), history of TBI with neurogenic bladder, chronic indwelling foley, and DVT  Significant Hospital Events   1/17 Re admit from SNF (recent discharge for MRSA bacteremia) 1/24 Intra pleural tPA, pulmozyme, 100% NRB 1/25 CT 940 cc out 24 hours, another 200 cc out since 7 this morning 1/27 Off O2  Consults:  PCCM  ID IR  Procedures:  1/18 US guided thoracentesis >> LDH 146, Protein 3.9, WBC 1,755 1/21 Left pigtail chest tube drain placement per IR  Significant Diagnostic Tests:  1/18 CTA Chest >> negative for PE. 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 Posterior subdural fluid collection extending from T6 through T10 compatible with abscess. 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  Discitis and osteomyelitis at   T10-11, T11-T12, T12-L1, L1-2,L4-5 . Per Neurosurgery, does not need surgery now.  Loculated left effusion, concern for infection.   CT Chest w/o 1/28 >> left empyema with significant decrease in fluid after chest tube placement.  Still moderate patchy loculated pleural fluid on the left with extensive left sided atelectasis or consolidation  Micro Data:  1/17 Blood cx >> MRSA 1/17 Urine cx >> Negative 1/18 Thoracentesis fluid >> negative  1/18 Blood Culture >> negative  Pleural fluid 1/22 >> negative  Antimicrobials:  1/17 Cefepime x 1 1/17 Metronidazole x 1 1/17 Vancomycin >>   Interim history/subjective:  CT with ~1L out since chamber changed 1/29 and tPA/Pulmozyme initiated.  No acute events overnight.     Objective   Blood pressure 124/66, pulse 79, temperature 98.2 F (36.8 C), temperature source Oral, resp. rate 20, height 5\' 9"  (1.753 m), weight 79.1 kg, SpO2 92 %.        Intake/Output Summary (Last 24 hours) at 07/18/2018 1038 Last data filed at 07/18/2018 0536 Gross per 24 hour  Intake -  Output 3240 ml  Net -3240 ml   Filed Weights   07/05/18 1900 07/06/18 0139  Weight: 114.8 kg 79.1 kg    Examination: General: chronically ill appearing adult male lying in bed in NAD HEENT: MM pink/moist, fair dentition Neuro: Awake/alert, oriented but slow to answer (hx TBI) CV: s1s2 rrr, no m/r/g PULM: even/non-labored, clear on right, diminished on left.  Left chest tube re-dressed, site cleaned, no evidence of local infection  WU:JWJXGI:soft, non-tender, bsx4 active  Extremities: warm/dry, trace BLE edema, muscle wasting Skin: no rashes or  lesions  Resolved Hospital Problem list     Assessment & Plan:   Left Upper and Lower Lobe PNA with loculated pleural effusion -Thora 1/18 with 90 ml fluid / unable to completely drain due to loculation -IR CT placed 1/21 with 650 ml drained P: Last dose of tPA/Pulmozyme this am  Repeat CXR in am  ABX per ID   MRSA  Bacteremia T11-T12, T12-L1 Diskitis / Osteomyelitis Epidural Abscess  -unable to perform TEE due to cervical hardware P: ID, NSGY following  Vancomycin per ID  NSGY rec's repeat spine imaging in 6 weeks   Best practice:  Diet: heart healthy  Pain/Anxiety/Delirium protocol (if indicated): N/A VAP protocol (if indicated): N/A DVT prophylaxis: Eliquis GI prophylaxis: N/A Glucose control: monitor Mobility: as toleraged Code Status: FULL Family Communication: Wife updated via phone 1/29 on plan for tPA/dornase.  No family available am 1/30.  Disposition: [facility].  PCCM will continue to follow.   Labs   CBC: Recent Labs  Lab 07/14/18 0301 07/15/18 0250 07/16/18 0247 07/17/18 1155 07/18/18 0322  WBC 10.2 9.6 9.3 9.0 8.9  HGB 8.4* 8.7* 7.7* 9.0* 7.9*  HCT 27.1* 28.7* 24.8* 30.3* 25.5*  MCV 88.0 87.8 87.3 88.9 87.9  PLT 434* 463* 391 425* 433*    Basic Metabolic Panel: Recent Labs  Lab 07/14/18 0301 07/15/18 0250 07/17/18 0323 07/17/18 1155 07/18/18 0322  NA 135 135 136 136 138  K 3.2* 3.6 3.0* 3.5 3.8  CL 104 105 109 110 111  CO2 21* 21* 20* 20* 21*  GLUCOSE 84 86 95 84 101*  BUN 8 7* 5* 6* 5*  CREATININE 0.65 0.63 0.65 0.63 0.69  CALCIUM 7.6* 7.7* 7.6* 7.8* 7.5*  MG  --   --   --  1.7 1.6*   GFR: Estimated Creatinine Clearance: 88.4 mL/min (by C-G formula based on SCr of 0.69 mg/dL). Recent Labs  Lab 07/15/18 0250 07/16/18 0247 07/17/18 1155 07/18/18 0322  WBC 9.6 9.3 9.0 8.9    Liver Function Tests: No results for input(s): AST, ALT, ALKPHOS, BILITOT, PROT, ALBUMIN in the last 168 hours. No results for input(s): LIPASE, AMYLASE in the last 168 hours. No results for input(s): AMMONIA in the last 168 hours.  ABG    Component Value Date/Time   PHART 7.429 04/29/2012 0453   PCO2ART 33.0 (L) 04/29/2012 0453   PO2ART 138.0 (H) 04/29/2012 0453   HCO3 21.5 04/29/2012 0453   TCO2 22.5 04/29/2012 0453   ACIDBASEDEF 2.2 (H) 04/29/2012 0453   O2SAT 99.3  04/29/2012 0453     Coagulation Profile: Recent Labs  Lab 07/11/18 1113  INR 1.34    Cardiac Enzymes: No results for input(s): CKTOTAL, CKMB, CKMBINDEX, TROPONINI in the last 168 hours.  HbA1C: No results found for: HGBA1C  CBG: No results for input(s): GLUCAP in the last 168 hours.  Canary BrimBrandi Ollis, NP-C [facility] Pgr: [phone] or if no answer [phone] 07/18/2018, 10:38 AM  Attending Note:  68 year old male with MRSA empyema with CT in place.  No complaints, no events overnight.  On exam, bibasilar crackles noted.  I reviewed CXR myself, improving pleural effusion noted.  Discussed with PCCM-NP.    Pleural effusion:  - DNAs and tPA instilled today  - Maintain CT on suction  Empyema  - Abx as ordered  - Vancomycin   - CT management as above  Hypoxemia:  - Titrate O2 for sat of 8-92%  PCCM will continue to follow.  Patient  seen and examined, agree with above note.  I dictated the care and orders written for this patient under my direction.  Alyson Reedy, MD [phone]

## 2018-07-18 NOTE — Progress Notes (Signed)
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  Was subsequently found to have recurrent MRSA bacteremia with left-sided pneumonia, loculated parapneumonic effusion, thoracic and lumbar spine discitis.  He underwent CT-guided chest tube placement and is followed by pulmonology and interventional radiology.  Given abnormal MRI findings, he has been followed by neurosurgery.  Neurosurgery recommended long-term IV antibiotics, no invasive intervention.  Pulmonology following chest tube and loculated pleural effusion.  Should be able to go to SNF when chest tube has been removed.  Continue long-term IV antibiotics.  A & P  Recurrent MRSA bacteremia with left-sided pneumonia with loculated left effusion/empyema, acute hypoxic respiratory failure 1/24.  Status post lytics 1/24. --Respiratory status remained stable.  Follow-up chest CT showed improvement but still significant loculated fluid. -Appreciate CCM management with scheduled TPA every 12 hours for now.  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  Not a candidate for TEE in the past secondary to possible esophageal obstruction. Repeat blood cultures no growth, final --Continue prolonged IV antibiotics as recommended by infectious disease, 8-10 weeks with repeat imaging in 6-8 weeks. Tunnel PICC given involuntary movements in arms planned in near future (was canceled 1/24 given patient instability). --Neurosurgery recommended medical management and there are no plans for surgical intervention at this point as of the evaluation 1/24.  PMH traumatic brain injury, PMH C5-C7 spinal cord  injury, chronic involuntary movements of the extremities, chronic urinary retention with chronic indwelling Foley catheter  Essential hypertension --Remains stable, continue carvedilol  Iron deficiency anemia.  Continue iron supplementation.  Status post IV iron.  Status post transfusion 3 units PRBC. --Hemoglobin dropped again. Work-up for hemolysis is unremarkable. We will continue to monitor H&H.  PMH DVT --Will continue therapeutic Lovenox for now.  Unstageable pressure injury of the bilateral buttocks present on admission --Continue wound care   PMH TBI, cord compression.  Resident in SNF.  Chronic Foley for neurogenic bladder.   Continue IV antibiotic.  Follow-up pulmonology recommendations.  Will remain hospitalized until able to remove chest tube.  DVT prophylaxis: enoxaparin Code Status: Full Family Communication: Disposition Plan: SNF   Consultants  . ID . Neurosurgery  . IR . Pulmonology  Procedures  . Diagnostic left thoracentesis . CT-guided left chest tube placement 1/20 . MRI under anesthesia . Echo Study Conclusions  - Left ventricle: The cavity size was normal. Systolic function was   normal. The estimated ejection fraction was in the range of 60%   to 65%. Wall motion was normal; there were no regional wall   motion abnormalities. Doppler parameters are consistent with   abnormal left ventricular relaxation (grade 1 diastolic   dysfunction). - Ventricular septum: Septal motion showed &quot;bounce&quot;. - Aortic valve: Transvalvular velocity was within the normal range.   There was no stenosis. There was no regurgitation. - Mitral valve: Transvalvular velocity was within the normal range.   There was no evidence for stenosis. There was no regurgitation. - Right ventricle: The cavity size was normal. Wall thickness was   normal. Systolic function was normal. - Atrial septum: No defect or patent foramen ovale was identified   by color flow  Doppler. - Tricuspid valve: There was no regurgitation.  Antibiotics  .  Interval History/Subjective  No acute complaints.  No nausea no vomiting.  No blood in the stool.  Objective   Vitals:  Vitals:   07/18/18 0815 07/18/18 1700  BP: 124/66 125/70  Pulse: 79 80  Resp:    Temp: 98.2 F (36.8 C) 98.3 F (36.8 C)  SpO2: 92% 95%    Exam: Constitutional:   . Appears calm and comfortable Respiratory:  . CTA bilaterally, no w/r/r.  . Respiratory effort normal.  Cardiovascular:  . RRR, no m/r/g . 2+ bilateral LE extremity edema   Musculoskeletal:  . RUE, LUE, RLE, LLE   . Involuntary movements arms and legs noted Neurologic:  . No apparent change Psychiatric:  . Mental status o Mood, affect appropriate  I have personally reviewed the following:   Today's Data  . Hemoglobin dropping to 7.3.  Fibrinogen normal.  INR normal.  Repeat hemoglobin stable.  Lab Data  .   Micro Data  .   Imaging   CT chest showed large loculated left pleural effusion with upper and lower lobe consolidation.  Multiple MRIs noted  Cardiology Data  .   Other Data  .   Scheduled Meds: . carvedilol  6.25 mg Oral BID WC  . collagenase   Topical Daily  . enoxaparin (LOVENOX) injection  80 mg Subcutaneous BID  . ferrous sulfate  325 mg Oral BID WC  . polyethylene glycol  17 g Oral BID  . senna  1 tablet Oral QHS  . tamsulosin  0.4 mg Oral Daily   Continuous Infusions: . lactated ringers 10 mL/hr at 07/16/18 0445  . vancomycin Stopped (07/18/18 1000)    Principal Problem:   MRSA bacteremia Active Problems:   Urinary retention   C5-C7 level spinal cord injury (HCC)   Essential hypertension   Chronic anemia   Traumatic brain injury (HCC)   Pleural effusion on left   Vertebral osteomyelitis (HCC)   Pressure injury of skin   Acute hypoxemic respiratory failure (HCC)   Atelectasis   Empyema (HCC)   LOS: 12 days      Author:  Lynden OxfordPranav Lajeana Strough, MD [facility] 07/18/2018  If 7PM-7AM, please contact night-coverage To reach On-call, see [email or web address]

## 2018-07-19 ENCOUNTER — Encounter (HOSPITAL_COMMUNITY): Payer: Self-pay | Admitting: Interventional Radiology

## 2018-07-19 ENCOUNTER — Inpatient Hospital Stay (HOSPITAL_COMMUNITY): Payer: Medicare Other

## 2018-07-19 HISTORY — PX: IR US GUIDE VASC ACCESS RIGHT: IMG2390

## 2018-07-19 HISTORY — PX: IR FLUORO GUIDE CV LINE RIGHT: IMG2283

## 2018-07-19 LAB — BASIC METABOLIC PANEL
ANION GAP: 6 (ref 5–15)
BUN: 6 mg/dL — ABNORMAL LOW (ref 8–23)
CO2: 22 mmol/L (ref 22–32)
Calcium: 7.5 mg/dL — ABNORMAL LOW (ref 8.9–10.3)
Chloride: 111 mmol/L (ref 98–111)
Creatinine, Ser: 0.7 mg/dL (ref 0.61–1.24)
GFR calc Af Amer: >60 mL/min (ref >60)
GFR calc non Af Amer: >60 mL/min (ref >60)
Glucose, Bld: 86 mg/dL (ref 70–99)
POTASSIUM: 3.6 mmol/L (ref 3.5–5.1)
Sodium: 139 mmol/L (ref 135–145)

## 2018-07-19 LAB — CBC
HEMATOCRIT: 25.4 % — AB (ref 39.0–52.0)
HEMOGLOBIN: 7.7 g/dL — AB (ref 13.0–17.0)
MCH: 27 pg (ref 26.0–34.0)
MCHC: 30.3 g/dL (ref 30.0–36.0)
MCV: 89.1 fL (ref 80.0–100.0)
Platelets: 394 10*3/uL (ref 150–400)
RBC: 2.85 MIL/uL — ABNORMAL LOW (ref 4.22–5.81)
RDW: 16.4 % — ABNORMAL HIGH (ref 11.5–15.5)
WBC: 7.7 10*3/uL (ref 4.0–10.5)
nRBC: 0 % (ref 0.0–0.2)

## 2018-07-19 LAB — HAPTOGLOBIN: Haptoglobin: 291 mg/dL (ref 32–363)

## 2018-07-19 MED ORDER — LIDOCAINE HCL (PF) 1 % IJ SOLN
INTRAMUSCULAR | Status: DC | PRN
  Administered 2018-07-19: 5 mL

## 2018-07-19 MED ORDER — LIDOCAINE HCL 1 % IJ SOLN
INTRAMUSCULAR | Status: AC
  Filled 2018-07-19: qty 20

## 2018-07-19 NOTE — Progress Notes (Addendum)
NAME:  Wayne Buckley, MRN:  [id number], DOB:  Sep 13, 1950, LOS: 13 ADMISSION DATE:  07/05/2018, CONSULTATION DATE:  1/19 REFERRING MD:  Dr. Lynden Oxford [facility]), CHIEF COMPLAINT:  Pleural Effusion   Brief History   68 yo M, admitted 1/17, recent admission for MRSA bacteremia & UTI in setting of chronic foley sent to [facility] ED from rehab facility for acute on chronic anemia requiring transfusion.  ECHO during prior admit showed no evidence of vegetation (unable to perform TEE). He was discharged to SNF on 1/13.    Found to have left sided pneumonia with parapneumonic effusion. Blood cultures on admission grew MRSA bacteremia. Underwent US guided thoracentesis 1/18 that yielded 90 cc of fluid. Unable to aspiration additional fluid due to loculations. CT Chest with large loculated effusion and evidence of T11-T12 and T12-L1 diskitis / osteomyelitis.   Past Medical History  HTN, chronic anemia requiring recurrent transfusion (no clinical bleeding), history of TBI with neurogenic bladder, chronic indwelling foley, and DVT  Significant Hospital Events   1/17 Re admit from SNF (recent discharge for MRSA bacteremia) 1/24 Intra pleural tPA, pulmozyme, 100% NRB 1/25 CT 940 cc out 24 hours, another 200 cc out since 7 this morning 1/27 Off O2 1/30 tPA/DNase stopped after three consecutive doses 1/31 Significant improvement in chest xray  Consults:  PCCM  ID IR  Procedures:  1/18 US guided thoracentesis >> LDH 146, Protein 3.9, WBC 1,755 1/21 Left pigtail chest tube drain placement per IR  Significant Diagnostic Tests:  1/18 CTA Chest >> negative for PE. 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 Posterior subdural fluid collection extending from T6 through T10 compatible with  abscess. 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  Discitis and osteomyelitis at  T10-11, T11-T12, T12-L1, L1-2,L4-5 . Per Neurosurgery, does not need surgery now.  Loculated left effusion, concern for infection.   CT Chest w/o 1/28 >> left empyema with significant decrease in fluid after chest tube placement.  Still moderate patchy loculated pleural fluid on the left with extensive left sided atelectasis or consolidation  Micro Data:  1/17 Blood cx >> MRSA 1/17 Urine cx >> Negative 1/18 Thoracentesis fluid >> negative  1/18 Blood Culture >> negative  Pleural fluid 1/22 >> negative   Antimicrobials:  1/17 Cefepime x 1 1/17 Metronidazole x 1 1/17 Vancomycin >>   Interim history/subjective:  Pt denies acute complaints.  ~ 200 ml out this am from chest tube based on last marking on chamber.  Afebrile.   Objective   Blood pressure 117/75, pulse 76, temperature 97.9 F (36.6 C), temperature source Oral, resp. rate 20, height 5\' 9"  (1.753 m), weight 79.1 kg, SpO2 96 %.        Intake/Output Summary (Last 24 hours) at 07/19/2018 0846 Last data filed at 07/19/2018 0600 Gross per 24 hour  Intake 843.73 ml  Output 1515 ml  Net -671.27 ml   Filed Weights   07/05/18 1900 07/06/18 0139  Weight: 114.8 kg 79.1 kg    Examination: General: chronically ill appearing male lying in bed in NAD HEENT: MM pink/moist, fair dentition  Neuro: Awake/alert, oriented, slow to respond but appropriate CV: s1s2 rrr, no m/r/g PULM: even/non-labored, clear on R, improved air movement on L BJ:YNWG, non-tender, bsx4 active  Extremities: warm/dry, trace BLE  edema/muscle wasting  Skin: no rashes or lesions  Resolved Hospital Problem list     Assessment & Plan:   Left Upper and Lower Lobe PNA with loculated pleural effusion -Thora 1/18 with 90 ml fluid / unable to completely drain due to loculation -IR CT placed 1/21 with 650 ml drained P: Follow intermittent CXR  Once drainage <22900ml in  24 hours, consider removal (assuming no re-acummulation)  ABX per ID / Primary   MRSA Bacteremia T11-T12, T12-L1 Diskitis / Osteomyelitis Epidural Abscess  -unable to perform TEE due to cervical hardware P: ID, NSGY following  NSGY recommends repeat spine imaging in ~6 weeks  ABX as above   Best practice:  Diet: heart healthy  Pain/Anxiety/Delirium protocol (if indicated): N/A VAP protocol (if indicated): N/A DVT prophylaxis: Eliquis GI prophylaxis: N/A Glucose control: monitor Mobility: as toleraged Code Status: FULL Family Communication: Wife updated on plan of care 1/30.   Disposition: TRH.  PCCM will continue to follow. Will plan to see again on 2/3.  Please call if new needs arise over the weekend.   Labs   CBC: Recent Labs  Lab 07/16/18 0247 07/17/18 1155 07/18/18 0322 07/18/18 1048 07/19/18 0245  WBC 9.3 9.0 8.9 7.4 7.7  NEUTROABS  --   --   --  5.3  --   HGB 7.7* 9.0* 7.9* 7.5* 7.7*  HCT 24.8* 30.3* 25.5* 24.0* 25.4*  MCV 87.3 88.9 87.9 87.6 89.1  PLT 391 425* 433* 408* 394    Basic Metabolic Panel: Recent Labs  Lab 07/15/18 0250 07/17/18 0323 07/17/18 1155 07/18/18 0322 07/19/18 0245  NA 135 136 136 138 139  K 3.6 3.0* 3.5 3.8 3.6  CL 105 109 110 111 111  CO2 21* 20* 20* 21* 22  GLUCOSE 86 95 84 101* 86  BUN 7* 5* 6* 5* 6*  CREATININE 0.63 0.65 0.63 0.69 0.70  CALCIUM 7.7* 7.6* 7.8* 7.5* 7.5*  MG  --   --  1.7 1.6*  --    GFR: Estimated Creatinine Clearance: 88.4 mL/min (by C-G formula based on SCr of 0.7 mg/dL). Recent Labs  Lab 07/17/18 1155 07/18/18 0322 07/18/18 1048 07/19/18 0245  WBC 9.0 8.9 7.4 7.7    Liver Function Tests: No results for input(s): AST, ALT, ALKPHOS, BILITOT, PROT, ALBUMIN in the last 168 hours. No results for input(s): LIPASE, AMYLASE in the last 168 hours. No results for input(s): AMMONIA in the last 168 hours.  ABG    Component Value Date/Time   PHART 7.429 04/29/2012 0453   PCO2ART 33.0 (L) 04/29/2012  0453   PO2ART 138.0 (H) 04/29/2012 0453   HCO3 21.5 04/29/2012 0453   TCO2 22.5 04/29/2012 0453   ACIDBASEDEF 2.2 (H) 04/29/2012 0453   O2SAT 99.3 04/29/2012 0453     Coagulation Profile: Recent Labs  Lab 07/18/18 1048  INR 1.34    Cardiac Enzymes: No results for input(s): CKTOTAL, CKMB, CKMBINDEX, TROPONINI in the last 168 hours.  HbA1C: No results found for: HGBA1C  CBG: No results for input(s): GLUCAP in the last 168 hours.  Canary BrimBrandi Ollis, NP-C [facility] Pgr: [phone] or if no answer [phone] 07/19/2018, 8:46 AM  Attending Note:  68 year old male with MRSA bacteremia and empyema.  No events overnight, no new complaints.  On exam, decreased BS diffusely.  I reviewed CXR myself, improvement in aeration.  Discussed with PCCM-NP.  Empyema:  - Hold further tPA/DNAse  - Vancomycin  - CT to suction  -  When CT OP<100 cc/day will consider d/c of CT  - If worsens will need CVTS to evaluate.  Pneumonia with MRSA  - Vancomycin  - Duration per ID  Hypoxemia:  - Titrate O2 for sat of 88-92%.  PCCM will see again on Monday, please call sooner if needed.  Alyson ReedyWesam G. Yacoub, M.D. [facility]. Pager: [phone]. After hours pager: [phone].

## 2018-07-19 NOTE — Progress Notes (Signed)
PROGRESS NOTE  Wayne Buckley AST:[id number] DOB: 1950-12-25 DOA: 07/05/2018 PCP: Patient, No Pcp Per  Brief History   68 year old male PMH TBI, subdural hemorrhage with left partial frontal lobectomy, cervical spinal cord injury, presented for evaluation of suspected anemia, was admitted for pneumonia.  Was subsequently found to have recurrent MRSA bacteremia with left-sided pneumonia, loculated parapneumonic effusion, thoracic and lumbar spine discitis.  He underwent CT-guided chest tube placement and is followed by pulmonology and interventional radiology.  Given abnormal MRI findings, he has been followed by neurosurgery.  Neurosurgery recommended long-term IV antibiotics, no invasive intervention.  Pulmonology following chest tube and loculated pleural effusion.  Should be able to go to SNF when chest tube has been removed.  Continue long-term IV antibiotics.  A & P  Recurrent MRSA bacteremia with left-sided pneumonia with loculated left effusion/empyema, acute hypoxic respiratory failure 1/24.  Status post lytics 1/24. --Respiratory status remained stable.  Follow-up chest CT showed improvement but still significant loculated fluid. -Appreciate CCM management Significant improvement with TPA instillation.  No further TPA. Plan is to remove the chest tube once the output is less than 200 cc.  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  Not a candidate for TEE in the past secondary to possible esophageal obstruction. Repeat blood cultures no growth, final --Continue prolonged IV antibiotics as recommended by infectious disease, 8-10 weeks with repeat imaging in 6-8 weeks. Tunnel PICC given involuntary movements in arms planned in near future (was canceled 1/24 given patient instability). --Neurosurgery recommended medical management and there are no plans for surgical intervention at this  point as of the evaluation 1/24.  PMH traumatic brain injury, PMH C5-C7 spinal cord injury, chronic involuntary movements of the extremities, chronic urinary retention with chronic indwelling Foley catheter  Essential hypertension --Remains stable, continue carvedilol  Iron deficiency anemia.  Continue iron supplementation.  Status post IV iron.  Status post transfusion 3 units PRBC. --Hemoglobin dropped again.  Now remained stable. Work-up for hemolysis is unremarkable. We will continue to monitor H&H.  PMH DVT --Will continue therapeutic Lovenox for now.  Unstageable pressure injury of the bilateral buttocks present on admission --Continue wound care   PMH TBI, cord compression.  Resident in SNF.  Chronic Foley for neurogenic bladder.   Continue IV antibiotic.  Follow-up pulmonology recommendations.  Will remain hospitalized until able to remove chest tube.  DVT prophylaxis: enoxaparin Code Status: Full Family Communication: Disposition Plan: SNF   Consultants  . ID . Neurosurgery  . IR . Pulmonology  Procedures  . Diagnostic left thoracentesis . CT-guided left chest tube placement 1/20 . MRI under anesthesia . Echo Study Conclusions  - Left ventricle: The cavity size was normal. Systolic function was   normal. The estimated ejection fraction was in the range of 60%   to 65%. Wall motion was normal; there were no regional wall   motion abnormalities. Doppler parameters are consistent with   abnormal left ventricular relaxation (grade 1 diastolic   dysfunction). - Ventricular septum: Septal motion showed &quot;bounce&quot;. - Aortic valve: Transvalvular velocity was within the normal range.   There was no stenosis. There was no regurgitation. - Mitral valve: Transvalvular velocity was within the normal range.   There was no evidence for stenosis. There was no regurgitation. - Right ventricle: The cavity size was normal. Wall thickness was   normal. Systolic  function was normal. - Atrial septum: No defect or patent foramen  ovale was identified   by color flow Doppler. - Tricuspid valve: There was no regurgitation.  Antibiotics  .   Interval History/Subjective  No acute events.  Initially was refusing for tunneled catheter placement but later on was agreeable after some persuasion. Wife was also worried about placement of the catheter without sedation but patient tolerated procedure well.  Objective   Vitals:  Vitals:   07/19/18 0751 07/19/18 1746  BP: 117/75 117/71  Pulse: 76 86  Resp: 20 18  Temp: 97.9 F (36.6 C) 99.6 F (37.6 C)  SpO2: 96% 97%    Exam: Constitutional:   . Appears calm and comfortable Respiratory:  . CTA bilaterally, no w/r/r.  . Respiratory effort normal.  Cardiovascular:  . RRR, no m/r/g . 2+ bilateral LE extremity edema   Musculoskeletal:  . RUE, LUE, RLE, LLE   . Involuntary movements arms and legs noted Neurologic:  . No apparent change Psychiatric:  . Mental status o Mood, affect appropriate  I have personally reviewed the following:   Today's Data  . Hemoglobin stable.  Lab Data  .   Micro Data  .   Imaging   CT chest showed large loculated left pleural effusion with upper and lower lobe consolidation.  Multiple MRIs noted  Cardiology Data  .   Other Data  .   Scheduled Meds: . carvedilol  6.25 mg Oral BID WC  . collagenase   Topical Daily  . enoxaparin (LOVENOX) injection  80 mg Subcutaneous BID  . ferrous sulfate  325 mg Oral BID WC  . lidocaine      . polyethylene glycol  17 g Oral BID  . senna  1 tablet Oral QHS  . tamsulosin  0.4 mg Oral Daily   Continuous Infusions: . lactated ringers 10 mL/hr at 07/16/18 0445  . vancomycin Stopped (07/19/18 1030)    Principal Problem:   MRSA bacteremia Active Problems:   Urinary retention   C5-C7 level spinal cord injury (HCC)   Essential hypertension   Chronic anemia   Traumatic brain injury (HCC)   Pleural  effusion on left   Vertebral osteomyelitis (HCC)   Pressure injury of skin   Acute hypoxemic respiratory failure (HCC)   Atelectasis   Empyema (HCC)   LOS: 13 days      Author:  Lynden OxfordPranav Nivan Melendrez, MD [facility] 07/19/2018  If 7PM-7AM, please contact night-coverage To reach On-call, see [email or web address]

## 2018-07-19 NOTE — Progress Notes (Signed)
Referring Physician(s): Dr Leodis Binet  Supervising Physician: Jolaine Click  Patient Status:  [facility] - In-pt  Chief Complaint:  Left chest effusion Left chest pigtail Chest tube placed 1/20  Subjective:  Up in bed Seems to be communicating some better OP into pleurvac at 1700 - serous fluid Today CXR:   IMPRESSION: 1. Scratch left chest tube in stable position. Significant improvement in left-sided pleural effusion. 2.  Left mid lung and lower lung atelectatic changes again noted.  IR has called to get pt down for Tunneled central catheter several times over last week---always "unstable or on Hold" Discussed with Dr Allena Katz-- on for today per MD   Allergies: Patient has no known allergies.  Medications: Prior to Admission medications   Medication Sig Start Date End Date Taking? Authorizing Provider  acetaminophen (TYLENOL) 325 MG tablet Take 2 tablets (650 mg total) by mouth every 6 (six) hours as needed for mild pain. 04/03/18  Yes Angiulli, Mcarthur Rossetti, PA-C  amLODipine (NORVASC) 5 MG tablet Take 5 mg by mouth daily.   Yes [provider]  apixaban (ELIQUIS) 5 MG TABS tablet Take 1 tablet (5 mg total) by mouth 2 (two) times daily. 04/03/18  Yes Angiulli, Mcarthur Rossetti, PA-C  bisacodyl (DULCOLAX) 10 MG suppository Place 1 suppository (10 mg total) rectally daily at 6 (six) AM. Patient taking differently: Place 10 mg rectally daily as needed for mild constipation.  04/03/18  Yes Angiulli, Mcarthur Rossetti, PA-C  carvedilol (COREG) 12.5 MG tablet Take 1 tablet (12.5 mg total) by mouth 2 (two) times daily with a meal. 04/03/18  Yes Angiulli, Mcarthur Rossetti, PA-C  ferrous sulfate 325 (65 FE) MG tablet Take 1 tablet (325 mg total) by mouth 2 (two) times daily with a meal. 05/31/18  Yes Calvert Cantor, MD  liver oil-zinc oxide (DESITIN) 40 % ointment Apply topically 4 (four) times daily. Apply to sacrum, coccyx, buttocks. Patient taking differently: Apply 1 application topically 4 (four) times  daily. Apply to sacrum, coccyx, buttocks. 05/14/18  Yes Rai, Ripudeep K, MD  polyethylene glycol (MIRALAX / GLYCOLAX) packet Take 17 g by mouth daily as needed for mild constipation or moderate constipation. 05/31/18  Yes Rizwan, Ladell Heads, MD  senna-docusate (SENOKOT-S) 8.6-50 MG tablet Take 1 tablet by mouth 2 (two) times daily. Patient taking differently: Take 1 tablet by mouth 2 (two) times daily as needed (constipation).  05/14/18  Yes Rai, Ripudeep K, MD  sodium phosphate (FLEET) 7-19 GM/118ML ENEM Place 133 mLs (1 enema total) rectally daily as needed for severe constipation. 05/31/18  Yes Calvert Cantor, MD  tamsulosin (FLOMAX) 0.4 MG CAPS capsule Take 1 capsule (0.4 mg total) by mouth daily. 03/06/18  Yes Leroy Sea, MD  vitamin C (VITAMIN C) 1000 MG tablet Take 1 tablet (1,000 mg total) by mouth 2 (two) times daily. 04/03/18  Yes Angiulli, Mcarthur Rossetti, PA-C  amLODipine (NORVASC) 10 MG tablet Take 1 tablet (10 mg total) by mouth daily. Patient not taking: Reported on 07/05/2018 03/06/18   Leroy Sea, MD  collagenase (SANTYL) ointment Apply topically daily. Cleanse gluteal wounds with NS.  Apply Santyl to open areas.  Cover with NS moist 2x2 and foam dressings.  Peel back foam and change daily. Foam is changed every three days and PRN soilage. Patient not taking: Reported on 07/05/2018 05/15/18   Rai, Delene Ruffini, MD  levofloxacin (LEVAQUIN) 750 MG tablet Take 750 mg by mouth daily. For 7 days. Start date 06/25/2018. End date 07/01/2018  [provider]  polyethylene glycol (MIRALAX / GLYCOLAX) packet Take 17 g by mouth 2 (two) times daily. Patient not taking: Reported on 07/05/2018 05/14/18   Cathren Harsh, MD     Vital Signs: BP 117/75 (BP Location: Right Arm)   Pulse 76   Temp 97.9 F (36.6 C) (Oral)   Resp 20   Ht 5\' 9"  (1.753 m)   Wt 174 lb 6.1 oz (79.1 kg)   SpO2 96%   BMI 25.75 kg/m   Physical Exam Vitals signs reviewed.  Pulmonary:     Effort: Pulmonary  effort is normal.  Skin:    General: Skin is warm and dry.     Comments: Site of CT is clean and dry NT no bleeding No air leak noted  1700 cc serous fluid in this pleurvac     Imaging: Ct Chest Wo Contrast  Result Date: 07/16/2018 CLINICAL DATA:  Follow-up pleural effusion EXAM: CT CHEST WITHOUT CONTRAST TECHNIQUE: Multidetector CT imaging of the chest was performed following the standard protocol without IV contrast. COMPARISON:  07/06/2018 FINDINGS: Cardiovascular: Normal heart size. No pericardial effusion. Coronary atherosclerotic calcification. Mediastinum/Nodes: Calcified mediastinal and left hilar lymph nodes attributed to old granulomatous disease. Lungs/Pleura: History of empyema. Pleural drain on the left with significant decrease in pleural fluid. There is still patchy loculated pleural disease seen medially and laterally at the apex and primarily laterally at the base. The largest pocket is at the lateral costophrenic sulcus and measures approximately 10 x 5 cm. Multifocal left lung atelectasis or consolidation. Mild atelectasis and small dependent effusion on the right. Upper Abdomen: No acute finding Musculoskeletal: T10-11 discitis osteomyelitis, known. Erosion has progressed by CT since 10 days ago. Spondylosis and multilevel thoracic ankylosis. IMPRESSION: Left empyema with significant decrease in fluid after chest tube placement. There is still moderate patchy loculated pleural fluid on the left with extensive left-sided atelectasis or consolidation. Electronically Signed   By: Marnee Spring M.D.   On: 07/16/2018 08:18   Dg Chest Port 1 View  Result Date: 07/19/2018 CLINICAL DATA:  Pleural effusion. EXAM: PORTABLE CHEST 1 VIEW COMPARISON:  CT 07/16/2017.  Chest x-ray 07/13/2017. FINDINGS: Left chest tube in stable position. Significant improvement in left-sided pleural effusion. Left mid and lower lung atelectatic changes again noted. No pneumothorax. Heart size stable. Prior  cervical spine fusion. IMPRESSION: 1. Scratch left chest tube in stable position. Significant improvement in left-sided pleural effusion. 2.  Left mid lung and lower lung atelectatic changes again noted. Electronically Signed   By: Maisie Fus  Register   On: 07/19/2018 06:08    Labs:  CBC: Recent Labs    07/17/18 1155 07/18/18 0322 07/18/18 1048 07/19/18 0245  WBC 9.0 8.9 7.4 7.7  HGB 9.0* 7.9* 7.5* 7.7*  HCT 30.3* 25.5* 24.0* 25.4*  PLT 425* 433* 408* 394    COAGS: Recent Labs    02/25/18 1413  05/10/18 0642 05/11/18 0615 05/12/18 0628 05/13/18 0503 07/05/18 1918 07/11/18 1113 07/18/18 1048  INR 1.16  --   --   --   --   --  1.89 1.34 1.34  APTT  --    < > 54* 53* 51* 54*  --   --   --    < > = values in this interval not displayed.    BMP: Recent Labs    07/17/18 0323 07/17/18 1155 07/18/18 0322 07/19/18 0245  NA 136 136 138 139  K 3.0* 3.5 3.8 3.6  CL 109 110 111  111  CO2 20* 20* 21* 22  GLUCOSE 95 84 101* 86  BUN 5* 6* 5* 6*  CALCIUM 7.6* 7.8* 7.5* 7.5*  CREATININE 0.65 0.63 0.69 0.70  GFRNONAA >60 >60 >60 >60  GFRAA >60 >60 >60 >60    LIVER FUNCTION TESTS: Recent Labs    05/17/18 0659 07/05/18 1918 07/07/18 0835 07/09/18 1725  BILITOT 0.5 0.7 0.4 0.5  AST 20 26 19 17   ALT 16 11 9 8   ALKPHOS 78 96 97 96  PROT 6.2* 6.3* 5.5* 5.8*  ALBUMIN 2.0* 1.8* 1.4* 1.5*    Assessment and Plan:  Left effusion Chest tube placed in IR 1/20 Draining well-- plans per CCM [facility]  Electronically Signed: Robet LeuPamela A Hafiz Irion, PA-C 07/19/2018, 8:01 AM   I spent a total of 15 Minutes at the the patient's bedside AND on the patient's hospital floor or unit, greater than 50% of which was counseling/coordinating care for left chest tube drain

## 2018-07-19 NOTE — Care Management Important Message (Signed)
Important Message  Patient Details  Name: Wayne Buckley MRN: [id number] Date of Birth: 12-15-1950   Medicare Important Message Given:  Yes    Leone Haven, RN 07/19/2018, 4:43 PM

## 2018-07-19 NOTE — Procedures (Signed)
RIJV tunneled PICC SVC RA 23 cm EBL 0 Comp 0 

## 2018-07-20 DIAGNOSIS — J9811 Atelectasis: Secondary | ICD-10-CM

## 2018-07-20 DIAGNOSIS — J869 Pyothorax without fistula: Secondary | ICD-10-CM

## 2018-07-20 LAB — BASIC METABOLIC PANEL
ANION GAP: 6 (ref 5–15)
BUN: 5 mg/dL — ABNORMAL LOW (ref 8–23)
CO2: 23 mmol/L (ref 22–32)
Calcium: 7.7 mg/dL — ABNORMAL LOW (ref 8.9–10.3)
Chloride: 110 mmol/L (ref 98–111)
Creatinine, Ser: 0.67 mg/dL (ref 0.61–1.24)
GFR calc non Af Amer: >60 mL/min (ref >60)
Glucose, Bld: 88 mg/dL (ref 70–99)
Potassium: 3.3 mmol/L — ABNORMAL LOW (ref 3.5–5.1)
Sodium: 139 mmol/L (ref 135–145)

## 2018-07-20 LAB — CBC
HCT: 24.5 % — ABNORMAL LOW (ref 39.0–52.0)
Hemoglobin: 7 g/dL — ABNORMAL LOW (ref 13.0–17.0)
MCH: 26 pg (ref 26.0–34.0)
MCHC: 28.6 g/dL — ABNORMAL LOW (ref 30.0–36.0)
MCV: 91.1 fL (ref 80.0–100.0)
Platelets: 462 10*3/uL — ABNORMAL HIGH (ref 150–400)
RBC: 2.69 MIL/uL — AB (ref 4.22–5.81)
RDW: 16.3 % — ABNORMAL HIGH (ref 11.5–15.5)
WBC: 5.7 10*3/uL (ref 4.0–10.5)
nRBC: 0 % (ref 0.0–0.2)

## 2018-07-20 MED ORDER — APIXABAN 5 MG PO TABS
5.0000 mg | ORAL_TABLET | Freq: Two times a day (BID) | ORAL | Status: DC
  Administered 2018-07-20 – 2018-07-25 (×10): 5 mg via ORAL
  Filled 2018-07-20 (×10): qty 1

## 2018-07-20 NOTE — Progress Notes (Signed)
PROGRESS NOTE  Wayne Buckley PRX:[id number] DOB: April 02, 1951 DOA: 07/05/2018 PCP: Patient, No Pcp Per  Brief History   68 year old male PMH TBI, subdural hemorrhage with left partial frontal lobectomy, cervical spinal cord injury, presented for evaluation of suspected anemia, was admitted for pneumonia.  Was subsequently found to have recurrent MRSA bacteremia with left-sided pneumonia, loculated parapneumonic effusion, thoracic and lumbar spine discitis.  He underwent CT-guided chest tube placement and is followed by pulmonology and interventional radiology.  Given abnormal MRI findings, he has been followed by neurosurgery.  Neurosurgery recommended long-term IV antibiotics, no invasive intervention.  Pulmonology following chest tube and loculated pleural effusion.  Should be able to go to SNF when chest tube has been removed.  Continue long-term IV antibiotics.  A & P  Recurrent MRSA bacteremia with left-sided pneumonia with loculated left effusion/empyema, acute hypoxic respiratory failure 1/24.  Status post lytics 1/24. --Respiratory status remained stable.  Follow-up chest CT showed improvement but still significant loculated fluid. -Appreciate CCM management Significant improvement with TPA instillation.  No further TPA. Plan is to remove the chest tube once the output is less than 200 cc.  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  Not a candidate for TEE in the past secondary to possible esophageal obstruction. Repeat blood cultures no growth, final --Continue prolonged IV antibiotics as recommended by infectious disease, 8-10 weeks with repeat imaging in 6-8 weeks. Tunnel PICC given involuntary movements in arms planned in near future (was canceled 1/24 given patient instability). --Neurosurgery recommended medical management and there are no plans for surgical intervention at this  point as of the evaluation 1/24.  PMH traumatic brain injury, PMH C5-C7 spinal cord injury, chronic involuntary movements of the extremities, chronic urinary retention with chronic indwelling Foley catheter  Essential hypertension --Remains stable, continue carvedilol  Iron deficiency anemia.  Continue iron supplementation.  Status post IV iron.  Status post transfusion 3 units PRBC. --Hemoglobin dropped again.  Now remained stable. Work-up for hemolysis is unremarkable. We will continue to monitor H&H.  PMH DVT --Patient was on therapeutic Lovenox.  Will transition to Eliquis.  Unstageable pressure injury of the bilateral buttocks present on admission --Continue wound care   PMH TBI, cord compression.  Resident in SNF.  Chronic Foley for neurogenic bladder.   Continue IV antibiotic.  Follow-up pulmonology recommendations.  Will remain hospitalized until able to remove chest tube.  DVT prophylaxis: enoxaparin now back on Eliquis. Code Status: Full Family Communication: Disposition Plan: SNF   Consultants  . ID . Neurosurgery  . IR . Pulmonology  Procedures  . Diagnostic left thoracentesis . CT-guided left chest tube placement 1/20 . MRI under anesthesia . Echo Study Conclusions  - Left ventricle: The cavity size was normal. Systolic function was   normal. The estimated ejection fraction was in the range of 60%   to 65%. Wall motion was normal; there were no regional wall   motion abnormalities. Doppler parameters are consistent with   abnormal left ventricular relaxation (grade 1 diastolic   dysfunction). - Ventricular septum: Septal motion showed &quot;bounce&quot;. - Aortic valve: Transvalvular velocity was within the normal range.   There was no stenosis. There was no regurgitation. - Mitral valve: Transvalvular velocity was within the normal range.   There was no evidence for stenosis. There was no regurgitation. - Right ventricle: The cavity size was normal.  Wall thickness was   normal. Systolic function was normal. -  Atrial septum: No defect or patent foramen ovale was identified   by color flow Doppler. - Tricuspid valve: There was no regurgitation.  Antibiotics  .   Interval History/Subjective  No acute events.  No nausea no vomiting.  Objective   Vitals:  Vitals:   07/20/18 0803 07/20/18 1709  BP: 117/76 133/79  Pulse: 73 79  Resp: 17 16  Temp: 98.1 F (36.7 C) 98.4 F (36.9 C)  SpO2: 98% 100%    Exam: Constitutional:   . Appears calm and comfortable Respiratory:  . CTA bilaterally, no w/r/r.  . Respiratory effort normal.  Cardiovascular:  . RRR, no m/r/g . 2+ bilateral LE extremity edema   Musculoskeletal:  . RUE, LUE, RLE, LLE   . Involuntary movements arms and legs noted Neurologic:  . No apparent change Psychiatric:  . Mental status o Mood, affect appropriate  I have personally reviewed the following:   Today's Data  . Hemoglobin stable.  Lab Data  .   Micro Data  .   Imaging   CT chest showed large loculated left pleural effusion with upper and lower lobe consolidation.  Multiple MRIs noted  Cardiology Data  .   Other Data  .   Scheduled Meds: . carvedilol  6.25 mg Oral BID WC  . collagenase   Topical Daily  . enoxaparin (LOVENOX) injection  80 mg Subcutaneous BID  . ferrous sulfate  325 mg Oral BID WC  . polyethylene glycol  17 g Oral BID  . senna  1 tablet Oral QHS  . tamsulosin  0.4 mg Oral Daily   Continuous Infusions: . lactated ringers 10 mL/hr at 07/16/18 0445  . vancomycin Stopped (07/20/18 7106)    Principal Problem:   MRSA bacteremia Active Problems:   Urinary retention   C5-C7 level spinal cord injury (HCC)   Essential hypertension   Chronic anemia   Traumatic brain injury (HCC)   Pleural effusion on left   Vertebral osteomyelitis (HCC)   Pressure injury of skin   Acute hypoxemic respiratory failure (HCC)   Atelectasis   Empyema (HCC)   LOS: 14 days       Author:  Lynden Oxford, MD [facility] 07/20/2018  If 7PM-7AM, please contact night-coverage To reach On-call, see [email or web address]

## 2018-07-20 NOTE — Progress Notes (Signed)
ANTICOAGULATION CONSULT NOTE - follow up Pharmacy Consult for Lovenox  Indication: DVT  No Known Allergies  Patient Measurements: Height: 5\' 9"  (175.3 cm) Weight: 174 lb 6.1 oz (79.1 kg) IBW/kg (Calculated) : 70.7  Vital Signs: Temp: 98.4 F (36.9 C) (02/01 1709) Temp Source: Oral (02/01 1709) BP: 133/79 (02/01 1709) Pulse Rate: 79 (02/01 1709)  Labs: Recent Labs    07/18/18 0322 07/18/18 1048 07/19/18 0245 07/20/18 0616  HGB 7.9* 7.5* 7.7* 7.0*  HCT 25.5* 24.0* 25.4* 24.5*  PLT 433* 408* 394 462*  LABPROT  --  16.4*  --   --   INR  --  1.34  --   --   CREATININE 0.69  --  0.70 0.67    Estimated Creatinine Clearance: 88.4 mL/min (by C-G formula based on SCr of 0.67 mg/dL).   Medical History: Past Medical History:  Diagnosis Date  . BPH (benign prostatic hyperplasia)   . DVT (deep venous thrombosis) (HCC) 02/2018   extensive LLE DVT  . H/O alcohol abuse   . Hypertension   . Neurogenic bowel   . Spinal cord injury, cervical region (HCC) 02/2018  . TBI (traumatic brain injury) (HCC) 2013   SDH with left partial frontal lobectomy    Medications:  Medications Prior to Admission  Medication Sig Dispense Refill Last Dose  . acetaminophen (TYLENOL) 325 MG tablet Take 2 tablets (650 mg total) by mouth every 6 (six) hours as needed for mild pain.   Past Month at Unknown time  . amLODipine (NORVASC) 5 MG tablet Take 5 mg by mouth daily.   07/05/2018 at    . apixaban (ELIQUIS) 5 MG TABS tablet Take 1 tablet (5 mg total) by mouth 2 (two) times daily. 60 tablet  07/05/2018 at 0935  . bisacodyl (DULCOLAX) 10 MG suppository Place 1 suppository (10 mg total) rectally daily at 6 (six) AM. (Patient taking differently: Place 10 mg rectally daily as needed for mild constipation. ) 12 suppository 0 07/04/2018 at Unknown time  . carvedilol (COREG) 12.5 MG tablet Take 1 tablet (12.5 mg total) by mouth 2 (two) times daily with a meal.   07/05/2018 at 0935  . ferrous sulfate 325 (65 FE)  MG tablet Take 1 tablet (325 mg total) by mouth 2 (two) times daily with a meal.  3 07/05/2018 at Unknown time  . liver oil-zinc oxide (DESITIN) 40 % ointment Apply topically 4 (four) times daily. Apply to sacrum, coccyx, buttocks. (Patient taking differently: Apply 1 application topically 4 (four) times daily. Apply to sacrum, coccyx, buttocks.) 56.7 g 0 07/05/2018 at Unknown time  . polyethylene glycol (MIRALAX / GLYCOLAX) packet Take 17 g by mouth daily as needed for mild constipation or moderate constipation. 14 each 0 07/04/2018 at Unknown time  . senna-docusate (SENOKOT-S) 8.6-50 MG tablet Take 1 tablet by mouth 2 (two) times daily. (Patient taking differently: Take 1 tablet by mouth 2 (two) times daily as needed (constipation). )   07/04/2018 at Unknown time  . sodium phosphate (FLEET) 7-19 GM/118ML ENEM Place 133 mLs (1 enema total) rectally daily as needed for severe constipation.  0 unk  . tamsulosin (FLOMAX) 0.4 MG CAPS capsule Take 1 capsule (0.4 mg total) by mouth daily. 30 capsule  07/04/2018 at Unknown time  . vitamin C (VITAMIN C) 1000 MG tablet Take 1 tablet (1,000 mg total) by mouth 2 (two) times daily.   07/05/2018 at Unknown time  . amLODipine (NORVASC) 10 MG tablet Take 1 tablet (10 mg total)  by mouth daily. (Patient not taking: Reported on 07/05/2018)   Not Taking at Unknown time  . collagenase (SANTYL) ointment Apply topically daily. Cleanse gluteal wounds with NS.  Apply Santyl to open areas.  Cover with NS moist 2x2 and foam dressings.  Peel back foam and change daily. Foam is changed every three days and PRN soilage. (Patient not taking: Reported on 07/05/2018) 15 g 0 Not Taking at Unknown time  . levofloxacin (LEVAQUIN) 750 MG tablet Take 750 mg by mouth daily. For 7 days. Start date 06/25/2018. End date 07/01/2018   07/01/2018  . polyethylene glycol (MIRALAX / GLYCOLAX) packet Take 17 g by mouth 2 (two) times daily. 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 He is s/p drain placement and prolonged IV antibiotics.   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 He received a few doses upon admission but anticoagulation has been held since 1/19 for placement of CT guided drain.  Underwent tunneled catheter placement on 1/31 - okay to resume apixaban per MD. Last dose of enoxaparin 80 mg was on 2/1@0840 . Hgb down at 7 today, plt WNL. No s/sx of bleeding.  Goal of Therapy:  Anti-Xa level 0.6-1 units/ml 4hrs after LMWH dose given Monitor platelets by anticoagulation protocol: Yes   Plan:  -Discontinue Lovenox 80 mg (1 mg/kg) SQ q12 hours -Start apixaban 5 mg twice daily tonight at 2000 -Monitor renal function and for bleeding   Sherron Monday, PharmD, BCCCP Clinical Pharmacist  Pager: [phone] Phone: [phone] Please utilize Amion for appropriate phone number to reach the unit pharmacist [facility]) 07/20/2018 7:43 PM

## 2018-07-20 NOTE — Progress Notes (Signed)
Wound to coccyx cleansed, santyl applied, wet to dry dressing.  Abrasion to R Knee cleansed gauze dressing applied. Mostly healed wound to r flanked cleansed and gauze dressing applied. Wound to L flank mostly healed, cleansed gauze dressing applied.

## 2018-07-20 NOTE — Progress Notes (Signed)
Referring Physician(s): Patel,P  Supervising Physician: Jolaine ClickHoss, Arthur  Patient Status:  [facility]  Chief Complaint: Left chest empyema   Subjective: Pt denies worsening CP/dyspnea, appears comfortable   Allergies: Patient has no known allergies.  Medications: Prior to Admission medications   Medication Sig Start Date End Date Taking? Authorizing Provider  acetaminophen (TYLENOL) 325 MG tablet Take 2 tablets (650 mg total) by mouth every 6 (six) hours as needed for mild pain. 04/03/18  Yes Angiulli, Mcarthur Rossettianiel J, PA-C  amLODipine (NORVASC) 5 MG tablet Take 5 mg by mouth daily.   Yes [provider]  apixaban (ELIQUIS) 5 MG TABS tablet Take 1 tablet (5 mg total) by mouth 2 (two) times daily. 04/03/18  Yes Angiulli, Mcarthur Rossettianiel J, PA-C  bisacodyl (DULCOLAX) 10 MG suppository Place 1 suppository (10 mg total) rectally daily at 6 (six) AM. Patient taking differently: Place 10 mg rectally daily as needed for mild constipation.  04/03/18  Yes Angiulli, Mcarthur Rossettianiel J, PA-C  carvedilol (COREG) 12.5 MG tablet Take 1 tablet (12.5 mg total) by mouth 2 (two) times daily with a meal. 04/03/18  Yes Angiulli, Mcarthur Rossettianiel J, PA-C  ferrous sulfate 325 (65 FE) MG tablet Take 1 tablet (325 mg total) by mouth 2 (two) times daily with a meal. 05/31/18  Yes Calvert Cantorizwan, Saima, MD  liver oil-zinc oxide (DESITIN) 40 % ointment Apply topically 4 (four) times daily. Apply to sacrum, coccyx, buttocks. Patient taking differently: Apply 1 application topically 4 (four) times daily. Apply to sacrum, coccyx, buttocks. 05/14/18  Yes Rai, Ripudeep K, MD  polyethylene glycol (MIRALAX / GLYCOLAX) packet Take 17 g by mouth daily as needed for mild constipation or moderate constipation. 05/31/18  Yes Rizwan, Ladell HeadsSaima, MD  senna-docusate (SENOKOT-S) 8.6-50 MG tablet Take 1 tablet by mouth 2 (two) times daily. Patient taking differently: Take 1 tablet by mouth 2 (two) times daily as needed (constipation).  05/14/18  Yes Rai, Ripudeep  K, MD  sodium phosphate (FLEET) 7-19 GM/118ML ENEM Place 133 mLs (1 enema total) rectally daily as needed for severe constipation. 05/31/18  Yes Calvert Cantorizwan, Saima, MD  tamsulosin (FLOMAX) 0.4 MG CAPS capsule Take 1 capsule (0.4 mg total) by mouth daily. 03/06/18  Yes Leroy SeaSingh, Prashant K, MD  vitamin C (VITAMIN C) 1000 MG tablet Take 1 tablet (1,000 mg total) by mouth 2 (two) times daily. 04/03/18  Yes Angiulli, Mcarthur Rossettianiel J, PA-C  amLODipine (NORVASC) 10 MG tablet Take 1 tablet (10 mg total) by mouth daily. Patient not taking: Reported on 07/05/2018 03/06/18   Leroy SeaSingh, Prashant K, MD  collagenase (SANTYL) ointment Apply topically daily. Cleanse gluteal wounds with NS.  Apply Santyl to open areas.  Cover with NS moist 2x2 and foam dressings.  Peel back foam and change daily. Foam is changed every three days and PRN soilage. Patient not taking: Reported on 07/05/2018 05/15/18   Rai, Delene Ruffiniipudeep K, MD  levofloxacin (LEVAQUIN) 750 MG tablet Take 750 mg by mouth daily. For 7 days. Start date 06/25/2018. End date 07/01/2018    [provider]  polyethylene glycol (MIRALAX / GLYCOLAX) packet Take 17 g by mouth 2 (two) times daily. Patient not taking: Reported on 07/05/2018 05/14/18   Cathren Harshai, Ripudeep K, MD     Vital Signs: BP 117/76 (BP Location: Left Arm)   Pulse 73   Temp 98.1 F (36.7 C) (Oral)   Resp 17   Ht 5\' 9"  (1.753 m)   Wt 174 lb 6.1 oz (79.1 kg)   SpO2 98%  BMI 25.75 kg/m   Physical Exam left chest drain in place; no air leak; output 420 amber fluid  Imaging: Ir Fluoro Guide Cv Line Right  Result Date: 07/19/2018 INDICATION: Pneumonia EXAM: TUNNELED RIGHT JUGULAR PICC LINE PLACEMENT WITH ULTRASOUND AND FLUOROSCOPIC GUIDANCE MEDICATIONS: None ANESTHESIA/SEDATION: None FLUOROSCOPY TIME:  Fluoroscopy Time:  minutes 6 seconds (0.3 mGy). COMPLICATIONS: None immediate. PROCEDURE: The patient was advised of the possible risks and complications and agreed to undergo the procedure. The patient was then  brought to the angiographic suite for the procedure. The right neck was prepped with chlorhexidine, draped in the usual sterile fashion using maximum barrier technique (cap and mask, sterile gown, sterile gloves, large sterile sheet, hand hygiene and cutaneous antiseptic). Local anesthesia was attained by infiltration with 1% lidocaine. Ultrasound demonstrated patency of the right jugular vein, and this was documented with an image. Under real-time ultrasound guidance, this vein was accessed with a 21 gauge micropuncture needle and image documentation was performed. The needle was exchanged over a guidewire for a peel-away sheath through which a 23 cm 5 [place] double lumen power injectable PICC was advanced, and positioned with its tip at the lower SVC/right atrial junction. The cuff was positioned in the subcutaneous tract. Fluoroscopy during the procedure and fluoro spot radiograph confirms appropriate catheter position. The catheter was flushed, secured to the skin with Prolene sutures, and covered with a sterile dressing. IMPRESSION: Successful placement of a tunneled right jugular PICC with sonographic and fluoroscopic guidance. The catheter is ready for use. Electronically Signed   By: Jolaine Click M.D.   On: 07/19/2018 13:11   Ir US Guide Vasc Access Right  Result Date: 07/19/2018 INDICATION: Pneumonia EXAM: TUNNELED RIGHT JUGULAR PICC LINE PLACEMENT WITH ULTRASOUND AND FLUOROSCOPIC GUIDANCE MEDICATIONS: None ANESTHESIA/SEDATION: None FLUOROSCOPY TIME:  Fluoroscopy Time:  minutes 6 seconds (0.3 mGy). COMPLICATIONS: None immediate. PROCEDURE: The patient was advised of the possible risks and complications and agreed to undergo the procedure. The patient was then brought to the angiographic suite for the procedure. The right neck was prepped with chlorhexidine, draped in the usual sterile fashion using maximum barrier technique (cap and mask, sterile gown, sterile gloves, large sterile sheet, hand hygiene  and cutaneous antiseptic). Local anesthesia was attained by infiltration with 1% lidocaine. Ultrasound demonstrated patency of the right jugular vein, and this was documented with an image. Under real-time ultrasound guidance, this vein was accessed with a 21 gauge micropuncture needle and image documentation was performed. The needle was exchanged over a guidewire for a peel-away sheath through which a 23 cm 5 [place] double lumen power injectable PICC was advanced, and positioned with its tip at the lower SVC/right atrial junction. The cuff was positioned in the subcutaneous tract. Fluoroscopy during the procedure and fluoro spot radiograph confirms appropriate catheter position. The catheter was flushed, secured to the skin with Prolene sutures, and covered with a sterile dressing. IMPRESSION: Successful placement of a tunneled right jugular PICC with sonographic and fluoroscopic guidance. The catheter is ready for use. Electronically Signed   By: Jolaine Click M.D.   On: 07/19/2018 13:11   Dg Chest Port 1 View  Result Date: 07/19/2018 CLINICAL DATA:  Pleural effusion. EXAM: PORTABLE CHEST 1 VIEW COMPARISON:  CT 07/16/2017.  Chest x-ray 07/13/2017. FINDINGS: Left chest tube in stable position. Significant improvement in left-sided pleural effusion. Left mid and lower lung atelectatic changes again noted. No pneumothorax. Heart size stable. Prior cervical spine fusion. IMPRESSION: 1. Scratch left chest tube in stable position.  Significant improvement in left-sided pleural effusion. 2.  Left mid lung and lower lung atelectatic changes again noted. Electronically Signed   By: Maisie Fus  Register   On: 07/19/2018 06:08    Labs:  CBC: Recent Labs    07/18/18 0322 07/18/18 1048 07/19/18 0245 07/20/18 0616  WBC 8.9 7.4 7.7 5.7  HGB 7.9* 7.5* 7.7* 7.0*  HCT 25.5* 24.0* 25.4* 24.5*  PLT 433* 408* 394 462*    COAGS: Recent Labs    02/25/18 1413  05/10/18 0642 05/11/18 0615 05/12/18 0628  05/13/18 0503 07/05/18 1918 07/11/18 1113 07/18/18 1048  INR 1.16  --   --   --   --   --  1.89 1.34 1.34  APTT  --    < > 54* 53* 51* 54*  --   --   --    < > = values in this interval not displayed.    BMP: Recent Labs    07/17/18 1155 07/18/18 0322 07/19/18 0245 07/20/18 0616  NA 136 138 139 139  K 3.5 3.8 3.6 3.3*  CL 110 111 111 110  CO2 20* 21* 22 23  GLUCOSE 84 101* 86 88  BUN 6* 5* 6* 5*  CALCIUM 7.8* 7.5* 7.5* 7.7*  CREATININE 0.63 0.69 0.70 0.67  GFRNONAA >60 >60 >60 >60  GFRAA >60 >60 >60 >60    LIVER FUNCTION TESTS: Recent Labs    05/17/18 0659 07/05/18 1918 07/07/18 0835 07/09/18 1725  BILITOT 0.5 0.7 0.4 0.5  AST 20 26 19 17   ALT 16 11 9 8   ALKPHOS 78 96 97 96  PROT 6.2* 6.3* 5.5* 5.8*  ALBUMIN 2.0* 1.8* 1.4* 1.5*    Assessment and Plan: Pt with hx MRSA PNA, left chest empyema; s/p left chest drain 07/08/18; afebrile; creat nl;WBC nl; hgb 7.0(7.7), ? transfuse; recent TPA/DNAse instillation into chest tube; last CXR yesterday with sig improvement in left effusion/no ptx; plans as per CCM   Electronically Signed: D. Jeananne Rama, PA-C 07/20/2018, 12:17 PM   I spent a total of 15 minutes at the the patient's bedside AND on the patient's hospital floor or unit, greater than 50% of which was counseling/coordinating care for left chest drain    Patient ID: Wayne Buckley, male   DOB: January 26, 1951, 68 y.o.   MRN: [id number]

## 2018-07-21 LAB — BASIC METABOLIC PANEL
Anion gap: 9 (ref 5–15)
BUN: <5 mg/dL — ABNORMAL LOW (ref 8–23)
CO2: 24 mmol/L (ref 22–32)
Calcium: 7.4 mg/dL — ABNORMAL LOW (ref 8.9–10.3)
Chloride: 103 mmol/L (ref 98–111)
Creatinine, Ser: 0.64 mg/dL (ref 0.61–1.24)
GFR calc Af Amer: >60 mL/min (ref >60)
GFR calc non Af Amer: >60 mL/min (ref >60)
GLUCOSE: 88 mg/dL (ref 70–99)
Potassium: 3.1 mmol/L — ABNORMAL LOW (ref 3.5–5.1)
Sodium: 136 mmol/L (ref 135–145)

## 2018-07-21 LAB — CBC
HCT: 24.2 % — ABNORMAL LOW (ref 39.0–52.0)
Hemoglobin: 7.3 g/dL — ABNORMAL LOW (ref 13.0–17.0)
MCH: 27.1 pg (ref 26.0–34.0)
MCHC: 30.2 g/dL (ref 30.0–36.0)
MCV: 90 fL (ref 80.0–100.0)
Platelets: 500 10*3/uL — ABNORMAL HIGH (ref 150–400)
RBC: 2.69 MIL/uL — ABNORMAL LOW (ref 4.22–5.81)
RDW: 16.1 % — ABNORMAL HIGH (ref 11.5–15.5)
WBC: 5 10*3/uL (ref 4.0–10.5)
nRBC: 0 % (ref 0.0–0.2)

## 2018-07-21 MED ORDER — POTASSIUM CHLORIDE CRYS ER 20 MEQ PO TBCR
40.0000 meq | EXTENDED_RELEASE_TABLET | ORAL | Status: AC
  Administered 2018-07-21 (×2): 40 meq via ORAL
  Filled 2018-07-21 (×2): qty 2

## 2018-07-21 NOTE — Progress Notes (Signed)
[facility] Progress Note  Patient: Wayne Buckley QQI:[id number]   PCP: Patient, No Pcp Per DOB: Feb 03, 1951   DOA: 07/05/2018   DOS: 07/21/2018   Date of Service: the patient was seen and examined on 07/21/2018  Brief History   68 year old male PMH TBI, subdural hemorrhage with left partial frontal lobectomy, cervical spinal cord injury, presented for evaluation of suspected anemia, was admitted for pneumonia.  Was subsequently found to have recurrent MRSA bacteremia with left-sided pneumonia, loculated parapneumonic effusion, thoracic and lumbar spine discitis.  He underwent CT-guided chest tube placement and is followed by pulmonology and interventional radiology.  Given abnormal MRI findings, he has been followed by neurosurgery.  Neurosurgery recommended long-term IV antibiotics, no invasive intervention.  Pulmonology following chest tube and loculated pleural effusion.  Should be able to go to SNF when chest tube has been removed.  Continue long-term IV antibiotics.  A & P  Recurrent MRSA bacteremia with left-sided pneumonia with loculated left effusion/empyema, acute hypoxic respiratory failure 1/24.  Status post lytics 1/24. --Respiratory status remained stable.  Follow-up chest CT showed improvement but still significant loculated fluid. -Appreciate CCM management Significant improvement with TPA instillation.  No further TPA. Plan is to remove the chest tube once the output is less than 200 cc.  output is 360 cc in last 24 hours.  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  Not a candidate for TEE in the past secondary to possible esophageal obstruction. Repeat blood cultures no growth, final --Continue prolonged IV antibiotics as recommended by infectious disease, 8-10 weeks with repeat imaging in 6-8 weeks. Tunnel PICC given involuntary movements in arms planned in near future  (was canceled 1/24 given patient instability). --Neurosurgery recommended medical management and there are no plans for surgical intervention. -Repeat imaging once antibiotics are completed.  PMH traumatic brain injury, PMH C5-C7 spinal cord injury, chronic involuntary movements of the extremities, chronic urinary retention with chronic indwelling Foley catheter  Essential hypertension --Remains stable, continue carvedilol  Iron deficiency anemia.  Continue iron supplementation.  Status post IV iron.  Status post transfusion 3 units PRBC. --Hemoglobin dropped again.  Now remained stable. Work-up for hemolysis is unremarkable. We will continue to monitor H&H.  PMH DVT --Patient was on therapeutic Lovenox.  Will transition to Eliquis.  Unstageable pressure injury of the bilateral buttocks present on admission --Continue wound care   Pressure Injury 07/10/18 Stage II -  Partial thickness loss of dermis presenting as a shallow open ulcer with a red, pink wound bed without slough. sacrum (Active)  07/10/18 1930  Location: Buttocks  Location Orientation: Left;Right  Staging: Stage II -  Partial thickness loss of dermis presenting as a shallow open ulcer with a red, pink wound bed without slough.  Wound Description (Comments): sacrum  Present on Admission:     PMH TBI, cord compression.  Resident in SNF.  Chronic Foley for neurogenic bladder.   Continue IV antibiotic.  Follow-up pulmonology recommendations.  Will remain hospitalized until able to remove chest tube.  DVT prophylaxis: Eliquis. Code Status: Full Family Communication: Discussed with wife at bedside on 07/19/2018. Disposition Plan: SNF   Consultants  . ID . Neurosurgery  . IR . Pulmonology  Procedures  . Diagnostic left thoracentesis . CT-guided left chest tube placement 1/20 . MRI under anesthesia . Echo   Scheduled Meds: . apixaban  5 mg Oral BID  . carvedilol  6.25 mg Oral BID  WC  . collagenase   Topical  Daily  . ferrous sulfate  325 mg Oral BID WC  . polyethylene glycol  17 g Oral BID  . senna  1 tablet Oral QHS  . tamsulosin  0.4 mg Oral Daily   Continuous Infusions: . lactated ringers 10 mL/hr at 07/16/18 0445  . vancomycin 1,000 mg (07/21/18 0855)   PRN Meds: acetaminophen, HYDROcodone-acetaminophen, lidocaine (PF) Antibiotics: Anti-infectives (From admission, onward)   Start     Dose/Rate Route Frequency Ordered Stop   07/12/18 0600  vancomycin (VANCOCIN) IVPB 1000 mg/200 mL premix     1,000 mg 200 mL/hr over 60 Minutes Intravenous Every 24 hours 07/11/18 1708     07/08/18 1800  vancomycin (VANCOCIN) IVPB 750 mg/150 ml premix  Status:  Discontinued     750 mg 150 mL/hr over 60 Minutes Intravenous Every 12 hours 07/07/18 1219 07/11/18 1708   07/06/18 1200  vancomycin (VANCOCIN) IVPB 1000 mg/200 mL premix  Status:  Discontinued     1,000 mg 200 mL/hr over 60 Minutes Intravenous Every 12 hours 07/06/18 1014 07/07/18 1219   07/06/18 1000  vancomycin (VANCOCIN) IVPB 1000 mg/200 mL premix  Status:  Discontinued     1,000 mg 200 mL/hr over 60 Minutes Intravenous Every 12 hours 07/06/18 0122 07/06/18 1006   07/06/18 1000  ceFEPIme (MAXIPIME) 2 g in sodium chloride 0.9 % 100 mL IVPB  Status:  Discontinued     2 g 200 mL/hr over 30 Minutes Intravenous Every 12 hours 07/06/18 0122 07/06/18 1756   07/05/18 2300  ceFEPIme (MAXIPIME) 2 g in sodium chloride 0.9 % 100 mL IVPB     2 g 200 mL/hr over 30 Minutes Intravenous  Once 07/05/18 2245 07/06/18 0014   07/05/18 2300  metroNIDAZOLE (FLAGYL) IVPB 500 mg  Status:  Discontinued     500 mg 100 mL/hr over 60 Minutes Intravenous Every 8 hours 07/05/18 2245 07/06/18 1006   07/05/18 2300  vancomycin (VANCOCIN) IVPB 1000 mg/200 mL premix  Status:  Discontinued     1,000 mg 200 mL/hr over 60 Minutes Intravenous  Once 07/05/18 2245 07/05/18 2249   07/05/18 2300  vancomycin (VANCOCIN) 2,000 mg in sodium chloride 0.9 % 500 mL IVPB     2,000 mg 250  mL/hr over 120 Minutes Intravenous  Once 07/05/18 2249 07/06/18 0259   07/05/18 2030  ceFEPIme (MAXIPIME) 2 g in sodium chloride 0.9 % 100 mL IVPB  Status:  Discontinued     2 g 200 mL/hr over 30 Minutes Intravenous  Once 07/05/18 2019 07/05/18 2022   07/05/18 2030  metroNIDAZOLE (FLAGYL) IVPB 500 mg  Status:  Discontinued     500 mg 100 mL/hr over 60 Minutes Intravenous Every 8 hours 07/05/18 2019 07/05/18 2022   07/05/18 2030  vancomycin (VANCOCIN) IVPB 1000 mg/200 mL premix  Status:  Discontinued     1,000 mg 200 mL/hr over 60 Minutes Intravenous  Once 07/05/18 2019 07/05/18 2022       Objective: Physical Exam: Vitals:   07/20/18 2000 07/21/18 0006 07/21/18 0824 07/21/18 1656  BP:  135/64 91/69 (!) 144/95  Pulse:  70 78 81  Resp:  15 15 16   Temp:  98.2 F (36.8 C) (!) 97.5 F (36.4 C) 98.5 F (36.9 C)  TempSrc:  Oral Oral Oral  SpO2: 100% 98% 99% 99%  Weight:      Height:        Intake/Output Summary (Last 24 hours) at 07/21/2018 1709 Last data  filed at 07/21/2018 1600 Gross per 24 hour  Intake 810 ml  Output 735 ml  Net 75 ml   Filed Weights   07/05/18 1900 07/06/18 0139  Weight: 114.8 kg 79.1 kg   General: Alert, Awake and Oriented to Time, Place and Person. Appear in mild distress, affect appropriate Eyes: PERRL, Conjunctiva normal ENT: Oral Mucosa clear moist. Neck: difficult to assess  JVD, no Abnormal Mass Or lumps Cardiovascular: S1 and S2 Present, no Murmur, Peripheral Pulses Present Respiratory: normal respiratory effort, Bilateral Air entry equal and Decreased, no use of accessory muscle, Clear to Auscultation, no Crackles, no wheezes Abdomen: Bowel Sound present, Soft and no tenderness, no hernia Skin: no redness, no Rash, no induration Extremities: bilateral  Pedal edema, no calf tenderness Neurologic: Grossly no focal neuro deficit. Bilaterally Equal motor strength with chronic generalized weakness.  Data Reviewed: CBC: Recent Labs  Lab  07/18/18 0322 07/18/18 1048 07/19/18 0245 07/20/18 0616 07/21/18 0427  WBC 8.9 7.4 7.7 5.7 5.0  NEUTROABS  --  5.3  --   --   --   HGB 7.9* 7.5* 7.7* 7.0* 7.3*  HCT 25.5* 24.0* 25.4* 24.5* 24.2*  MCV 87.9 87.6 89.1 91.1 90.0  PLT 433* 408* 394 462* 500*   Basic Metabolic Panel: Recent Labs  Lab 07/17/18 1155 07/18/18 0322 07/19/18 0245 07/20/18 0616 07/21/18 0427  NA 136 138 139 139 136  K 3.5 3.8 3.6 3.3* 3.1*  CL 110 111 111 110 103  CO2 20* 21* 22 23 24   GLUCOSE 84 101* 86 88 88  BUN 6* 5* 6* 5* <5*  CREATININE 0.63 0.69 0.70 0.67 0.64  CALCIUM 7.8* 7.5* 7.5* 7.7* 7.4*  MG 1.7 1.6*  --   --   --     Liver Function Tests: No results for input(s): AST, ALT, ALKPHOS, BILITOT, PROT, ALBUMIN in the last 168 hours. No results for input(s): LIPASE, AMYLASE in the last 168 hours. No results for input(s): AMMONIA in the last 168 hours. Coagulation Profile: Recent Labs  Lab 07/18/18 1048  INR 1.34   Cardiac Enzymes: No results for input(s): CKTOTAL, CKMB, CKMBINDEX, TROPONINI in the last 168 hours. BNP (last 3 results) No results for input(s): PROBNP in the last 8760 hours. CBG: No results for input(s): GLUCAP in the last 168 hours. Studies: No results found.   Time spent: 35 minutes  Author: Lynden Oxford, MD [facility] 07/21/2018 5:09 PM  Between 7PM-7AM, please contact night-coverage at [email or web address]

## 2018-07-21 NOTE — Progress Notes (Signed)
Pharmacy Antibiotic Note  Wayne Buckley is a 68 y.o. male admitted on 07/05/2018 with Thoracolumbar osteomyelitis, Empyema, Bacteremia - MRSA.  Pharmacy consulted 07/05/18 for vancomycin dosing.  Afebrile, Tmax 98.4, WBC wnl. Scr wnl stable, CrCl 88 ml/min.  UOP 0.6 ml/kg/hr. Planning at least 6 weeks of therapy per ID  , end date is 3/28. Last checked levels on 07/18/18: Vanc peak = 27 mcg/ml and vanc trough = 14 mcg/ml on 1000 mg q24h. Vancomycin 1000 mg IV Q 24 hrs. Goal AUC 400-550. Calculated AUC: 474.3 AUC is within goal 400-500   ID noted  needs 8-10 weeks of IV therapy followed by protracted oral therapy for at least 6 up to 12 months. Will need repeat imaging at 6- 8 week mark to reassess considering severity of disease.    Plan: Continue Vancomycin 1000 mg IV every 24 hours. (projected new AUC 474) Stop date for Vanc is 3/28 (10 weeks total) Monitor Vancomycin levels weekly every Thursday. Monitor clinical progress, cultures/sensitivities, renal function.  Height: 5\' 9"  (175.3 cm) Weight: 174 lb 6.1 oz (79.1 kg) IBW/kg (Calculated) : 70.7  Temp (24hrs), Avg:98 F (36.7 C), Min:97.5 F (36.4 C), Max:98.4 F (36.9 C)  Recent Labs  Lab 07/17/18 1155 07/18/18 0322 07/18/18 0614 07/18/18 1048 07/19/18 0245 07/20/18 0616 07/21/18 0427  WBC 9.0 8.9  --  7.4 7.7 5.7 5.0  CREATININE 0.63 0.69  --   --  0.70 0.67 0.64  VANCOTROUGH  --   --  14*  --   --   --   --   VANCOPEAK  --   --   --  27*  --   --   --     Estimated Creatinine Clearance: 88.4 mL/min (by C-G formula based on SCr of 0.64 mg/dL).    No Known Allergies   Antibiotics: 1/18 Vanc >> 1/17 Cefepime>>1/17  Dose adjustments:  -1/22:   VP on 1/22 was 41, VP on 1/23 was 16 (AUC of 662 on 750mg  Q12h; dose decreased) - 1/30:  VP = 27, VT = 14 (AUC 474.3 on 1000 mg q24h)    Microbiology:  1/17 urine>>neg 1/17 blood>>MRSA 1/18 MRSA>>positive 1/18 BCID>>MRSA 1/18 blood>>negative 1/18 throacentesis fluid:  negative 1/22: pleural fluid: negative   Thank you for allowing pharmacy to be part of this patients care team.  Noah Delaine, RPh Clinical Pharmacist [phone] Please utilize Amion for appropriate phone number to reach the unit pharmacist [facility]) 07/21/2018 2:47 PM

## 2018-07-22 ENCOUNTER — Inpatient Hospital Stay (HOSPITAL_COMMUNITY): Payer: Medicare Other

## 2018-07-22 LAB — CBC
HCT: 24.6 % — ABNORMAL LOW (ref 39.0–52.0)
Hemoglobin: 7.6 g/dL — ABNORMAL LOW (ref 13.0–17.0)
MCH: 27.2 pg (ref 26.0–34.0)
MCHC: 30.9 g/dL (ref 30.0–36.0)
MCV: 88.2 fL (ref 80.0–100.0)
Platelets: 563 10*3/uL — ABNORMAL HIGH (ref 150–400)
RBC: 2.79 MIL/uL — ABNORMAL LOW (ref 4.22–5.81)
RDW: 16.1 % — AB (ref 11.5–15.5)
WBC: 5.5 10*3/uL (ref 4.0–10.5)
nRBC: 0 % (ref 0.0–0.2)

## 2018-07-22 LAB — BASIC METABOLIC PANEL
Anion gap: 7 (ref 5–15)
BUN: 5 mg/dL — ABNORMAL LOW (ref 8–23)
CO2: 24 mmol/L (ref 22–32)
CREATININE: 0.65 mg/dL (ref 0.61–1.24)
Calcium: 7.7 mg/dL — ABNORMAL LOW (ref 8.9–10.3)
Chloride: 106 mmol/L (ref 98–111)
GFR calc Af Amer: >60 mL/min (ref >60)
GFR calc non Af Amer: >60 mL/min (ref >60)
Glucose, Bld: 92 mg/dL (ref 70–99)
Potassium: 3.9 mmol/L (ref 3.5–5.1)
Sodium: 137 mmol/L (ref 135–145)

## 2018-07-22 NOTE — Progress Notes (Signed)
NAME:  Wayne Buckley, MRN:  [id number], DOB:  04-25-1951, LOS: 16 ADMISSION DATE:  07/05/2018, CONSULTATION DATE:  1/19 REFERRING MD:  Dr. Lynden Oxford [facility]), CHIEF COMPLAINT:  Pleural Effusion   Brief History   68 yo M, admitted 1/17, recent admission for MRSA bacteremia & UTI in setting of chronic foley sent to [facility] ED from rehab facility for acute on chronic anemia requiring transfusion.  ECHO during prior admit showed no evidence of vegetation (unable to perform TEE). He was discharged to SNF on 1/13.    Found to have left sided pneumonia with parapneumonic effusion. Blood cultures on admission grew MRSA bacteremia. Underwent US guided thoracentesis 1/18 that yielded 90 cc of fluid. Unable to aspiration additional fluid due to loculations. CT Chest with large loculated effusion and evidence of T11-T12 and T12-L1 diskitis / osteomyelitis.   Past Medical History  HTN, chronic anemia requiring recurrent transfusion (no clinical bleeding), history of TBI with neurogenic bladder, chronic indwelling foley, and DVT  Significant Hospital Events   1/17 Re admit from SNF (recent discharge for MRSA bacteremia) 1/24 Intra pleural tPA, pulmozyme, 100% NRB 1/25 CT 940 cc out 24 hours, another 200 cc out since 7 this morning 1/27 Off O2 1/30 tPA/DNase stopped after three consecutive doses 1/31 Significant improvement in chest xray  Consults:  PCCM  ID IR  Procedures:  1/18 US guided thoracentesis >> LDH 146, Protein 3.9, WBC 1,755 1/21 Left pigtail chest tube drain placement per IR  Significant Diagnostic Tests:  1/18 CTA Chest >> negative for PE. 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 Posterior subdural fluid collection extending from T6 through T10 compatible with  abscess. 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  Discitis and osteomyelitis at  T10-11, T11-T12, T12-L1, L1-2,L4-5 . Per Neurosurgery, does not need surgery now.  Loculated left effusion, concern for infection.   CT Chest w/o 1/28 >> left empyema with significant decrease in fluid after chest tube placement.  Still moderate patchy loculated pleural fluid on the left with extensive left sided atelectasis or consolidation  Micro Data:  1/17 Blood cx >> MRSA 1/17 Urine cx >> Negative 1/18 Thoracentesis fluid >> negative  1/18 Blood Culture >> negative  Pleural fluid 1/22 >> negative   Antimicrobials:  1/17 Cefepime x 1 1/17 Metronidazole x 1 1/17 Vancomycin >>   Interim history/subjective:  No significant clinical change.  Remains weak Chest tube output approximately 180 cc over the last 24 hours  Objective   Blood pressure 131/80, pulse 88, temperature 97.7 F (36.5 C), temperature source Oral, resp. rate 18, height 5\' 9"  (1.753 m), weight 79.1 kg, SpO2 98 %.        Intake/Output Summary (Last 24 hours) at 07/22/2018 1031 Last data filed at 07/22/2018 0931 Gross per 24 hour  Intake 810 ml  Output 2130 ml  Net -1320 ml   Filed Weights   07/05/18 1900 07/06/18 0139  Weight: 114.8 kg 79.1 kg    Examination: General: Chronically ill-appearing man, no distress HEENT: Pharynx moist, fair dentition Neuro: Awake, alert, a bit slow to respond but he is oriented CV: Regular, no murmur PULM: Normal respiratory effort, clear on the right, decreased breath sounds on the left GI: Soft, benign, positive bowel sounds Extremities: No deformities, muscle wasting, trace  bilateral lower extremity edema Skin: No rash  Resolved Hospital Problem list     Assessment & Plan:   Left Upper and Lower Lobe PNA with loculated pleural effusion -Thora 1/18 with 90 ml fluid / unable to completely drain due to loculation -IR CT placed 1/21 P: Chest tube output has dropped below 200  cc and I think it is reasonable now to remove the chest tube and follow him clinically, radiographically Recommend repeat CT scan of the chest in about 2 to 4 weeks to assess the left pleural space. He is not a candidate for VATS decortication due to his overall clinical illness  MRSA Bacteremia T11-T12, T12-L1 Diskitis / Osteomyelitis Epidural Abscess  -unable to perform TEE due to cervical hardware P: Appreciate ID and neurosurgery input Repeat spine imaging in about 6 weeks Long-term antibiotics.  Suspect he will require for life.   Best practice:  Diet: heart healthy  Pain/Anxiety/Delirium protocol (if indicated): N/A VAP protocol (if indicated): N/A DVT prophylaxis: Eliquis GI prophylaxis: N/A Glucose control: monitor Mobility: as toleraged Code Status: FULL Family Communication: No family present 2/3.  Plan discussed with Dr. Allena Katz  Labs   CBC: Recent Labs  Lab 07/18/18 1048 07/19/18 0245 07/20/18 0616 07/21/18 0427 07/22/18 0430  WBC 7.4 7.7 5.7 5.0 5.5  NEUTROABS 5.3  --   --   --   --   HGB 7.5* 7.7* 7.0* 7.3* 7.6*  HCT 24.0* 25.4* 24.5* 24.2* 24.6*  MCV 87.6 89.1 91.1 90.0 88.2  PLT 408* 394 462* 500* 563*    Basic Metabolic Panel: Recent Labs  Lab 07/17/18 1155 07/18/18 0322 07/19/18 0245 07/20/18 0616 07/21/18 0427 07/22/18 0430  NA 136 138 139 139 136 137  K 3.5 3.8 3.6 3.3* 3.1* 3.9  CL 110 111 111 110 103 106  CO2 20* 21* 22 23 24 24   GLUCOSE 84 101* 86 88 88 92  BUN 6* 5* 6* 5* <5* 5*  CREATININE 0.63 0.69 0.70 0.67 0.64 0.65  CALCIUM 7.8* 7.5* 7.5* 7.7* 7.4* 7.7*  MG 1.7 1.6*  --   --   --   --    GFR: Estimated Creatinine Clearance: 88.4 mL/min (by C-G formula based on SCr of 0.65 mg/dL). Recent Labs  Lab 07/19/18 0245 07/20/18 0616 07/21/18 0427 07/22/18 0430  WBC 7.7 5.7 5.0 5.5   ABG    Component Value Date/Time   PHART 7.429 04/29/2012 0453   PCO2ART 33.0 (L) 04/29/2012 0453   PO2ART 138.0 (H) 04/29/2012 0453   HCO3  21.5 04/29/2012 0453   TCO2 22.5 04/29/2012 0453   ACIDBASEDEF 2.2 (H) 04/29/2012 0453   O2SAT 99.3 04/29/2012 0453     Coagulation Profile: Recent Labs  Lab 07/18/18 1048  INR 1.34    Levy Pupa, MD, PhD 07/22/2018, 10:35 AM [facility] [phone] or if no answer [phone]

## 2018-07-22 NOTE — Progress Notes (Signed)
CSW double checked with [facility], they are able to take the patient. CSW faxed over most recent therapy notes. They are able to accommodate the patient with his IV antibiotics.   Patient's wife also requested an air mattress for her husband. CSW relayed the message to the SNF facility.   Per MD, patient should be ready to DC on 07/23/2018.   CSW will continue to follow.   Drucilla Schmidt, MSW, LCSW-A Clinical Social Worker [employer]

## 2018-07-22 NOTE — Progress Notes (Signed)
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  Was subsequently found to have recurrent MRSA bacteremia with left-sided pneumonia, loculated parapneumonic effusion, thoracic and lumbar spine discitis.  He underwent CT-guided chest tube placement and is followed by pulmonology and interventional radiology.  Given abnormal MRI findings, he has been followed by neurosurgery.  Neurosurgery recommended long-term IV antibiotics, no invasive intervention.  Pulmonology following chest tube and loculated pleural effusion.  Should be able to go to SNF when chest tube has been removed.  Continue long-term IV antibiotics.  A & P  Recurrent MRSA bacteremia with left-sided pneumonia with loculated left effusion/empyema, acute hypoxic respiratory failure 1/24.  Status post lytics 1/24. --Respiratory status remained stable.  Follow-up chest CT showed improvement but still significant loculated fluid. -Appreciate CCM management Significant improvement with TPA instillation.  No further TPA. Plan is to remove the chest tube once the output is less than 200 cc.  output is 20 cc in last 24 hours. While CCM agreed and recommended that the chest tube could be removed as pleural fluid will less likely to be completely resolved.  IR recommends to continue chest tube for now.  Will discuss with them tomorrow.  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  Not a candidate for TEE in the past secondary to possible esophageal obstruction. Repeat blood  cultures no growth, final --Continue prolonged IV antibiotics as recommended by infectious disease, 8-10 weeks with repeat imaging in 6-8 weeks. Tunnel PICC given involuntary movements in arms planned in near future (was canceled 1/24 given patient instability). --Neurosurgery recommended medical management and there are no plans for surgical intervention. -Repeat imaging once antibiotics are completed.  PMH traumatic brain injury, PMH C5-C7 spinal cord injury, chronic involuntary movements of the extremities, chronic urinary retention with chronic indwelling Foley catheter  Essential hypertension --Remains stable, continue carvedilol  Iron deficiency anemia.  Continue iron supplementation.  Status post IV iron.  Status post transfusion 3 units PRBC. --Hemoglobin dropped again.  Now remained stable. Work-up for hemolysis is unremarkable. We will continue to monitor H&H.  PMH DVT --Patient was on therapeutic Lovenox.  Will transition to Eliquis.  Unstageable pressure injury of the bilateral buttocks present on admission --Continue wound care   Pressure Injury 07/10/18 Stage II -  Partial thickness loss of dermis presenting as a shallow open ulcer with a red, pink wound bed without slough. sacrum (Active)  07/10/18 1930  Location: Buttocks  Location Orientation: Left;Right  Staging: Stage II -  Partial thickness loss of dermis presenting as a shallow open ulcer with a red, pink wound bed without slough.  Wound Description (Comments): sacrum  Present on Admission:     PMH TBI, cord compression.  Resident in SNF.  Chronic Foley for neurogenic bladder.   Continue IV antibiotic.  Follow-up pulmonology recommendations.  Will remain hospitalized until able to remove chest tube.  DVT prophylaxis: Eliquis. Code Status: Full Family Communication: Discussed with wife at bedside on 07/19/2018. Disposition Plan: SNF   Consultants  . ID . Neurosurgery  . IR . Pulmonology  Procedures   .  Diagnostic left thoracentesis . CT-guided left chest tube placement 1/20 . MRI under anesthesia . Echo   Scheduled Meds: . apixaban  5 mg Oral BID  . carvedilol  6.25 mg Oral BID WC  . collagenase   Topical Daily  . ferrous sulfate  325 mg Oral BID WC  . polyethylene glycol  17 g Oral BID  . senna  1 tablet Oral QHS  . tamsulosin  0.4 mg Oral Daily   Continuous Infusions: . lactated ringers 10 mL/hr at 07/16/18 0445  . vancomycin 1,000 mg (07/22/18 0935)   PRN Meds: acetaminophen, HYDROcodone-acetaminophen, lidocaine (PF) Antibiotics: Anti-infectives (From admission, onward)   Start     Dose/Rate Route Frequency Ordered Stop   07/12/18 0600  vancomycin (VANCOCIN) IVPB 1000 mg/200 mL premix     1,000 mg 200 mL/hr over 60 Minutes Intravenous Every 24 hours 07/11/18 1708     07/08/18 1800  vancomycin (VANCOCIN) IVPB 750 mg/150 ml premix  Status:  Discontinued     750 mg 150 mL/hr over 60 Minutes Intravenous Every 12 hours 07/07/18 1219 07/11/18 1708   07/06/18 1200  vancomycin (VANCOCIN) IVPB 1000 mg/200 mL premix  Status:  Discontinued     1,000 mg 200 mL/hr over 60 Minutes Intravenous Every 12 hours 07/06/18 1014 07/07/18 1219   07/06/18 1000  vancomycin (VANCOCIN) IVPB 1000 mg/200 mL premix  Status:  Discontinued     1,000 mg 200 mL/hr over 60 Minutes Intravenous Every 12 hours 07/06/18 0122 07/06/18 1006   07/06/18 1000  ceFEPIme (MAXIPIME) 2 g in sodium chloride 0.9 % 100 mL IVPB  Status:  Discontinued     2 g 200 mL/hr over 30 Minutes Intravenous Every 12 hours 07/06/18 0122 07/06/18 1756   07/05/18 2300  ceFEPIme (MAXIPIME) 2 g in sodium chloride 0.9 % 100 mL IVPB     2 g 200 mL/hr over 30 Minutes Intravenous  Once 07/05/18 2245 07/06/18 0014   07/05/18 2300  metroNIDAZOLE (FLAGYL) IVPB 500 mg  Status:  Discontinued     500 mg 100 mL/hr over 60 Minutes Intravenous Every 8 hours 07/05/18 2245 07/06/18 1006   07/05/18 2300  vancomycin (VANCOCIN) IVPB 1000 mg/200 mL  premix  Status:  Discontinued     1,000 mg 200 mL/hr over 60 Minutes Intravenous  Once 07/05/18 2245 07/05/18 2249   07/05/18 2300  vancomycin (VANCOCIN) 2,000 mg in sodium chloride 0.9 % 500 mL IVPB     2,000 mg 250 mL/hr over 120 Minutes Intravenous  Once 07/05/18 2249 07/06/18 0259   07/05/18 2030  ceFEPIme (MAXIPIME) 2 g in sodium chloride 0.9 % 100 mL IVPB  Status:  Discontinued     2 g 200 mL/hr over 30 Minutes Intravenous  Once 07/05/18 2019 07/05/18 2022   07/05/18 2030  metroNIDAZOLE (FLAGYL) IVPB 500 mg  Status:  Discontinued     500 mg 100 mL/hr over 60 Minutes Intravenous Every 8 hours 07/05/18 2019 07/05/18 2022   07/05/18 2030  vancomycin (VANCOCIN) IVPB 1000 mg/200 mL premix  Status:  Discontinued     1,000 mg 200 mL/hr over 60 Minutes Intravenous  Once 07/05/18 2019 07/05/18 2022       Objective: Physical Exam: Vitals:   07/21/18 1656 07/21/18 2220 07/22/18 0756 07/22/18 1709  BP: (!) 144/95 123/76 131/80 123/84  Pulse: 81 82 88 78  Resp: 16 14 18 18   Temp: 98.5 F (36.9 C) 99.1 F (37.3 C) 97.7 F (36.5 C)   TempSrc: Oral Oral  Oral Oral  SpO2: 99% 91% 98% 92%  Weight:      Height:        Intake/Output Summary (Last 24 hours) at 07/22/2018 1924 Last data filed at 07/22/2018 1757 Gross per 24 hour  Intake 0 ml  Output 2310 ml  Net -2310 ml   Filed Weights   07/05/18 1900 07/06/18 0139  Weight: 114.8 kg 79.1 kg   General: Alert, Awake and Oriented to Time, Place and Person. Appear in mild distress, affect appropriate Eyes: PERRL, Conjunctiva normal ENT: Oral Mucosa clear moist. Neck: difficult to assess  JVD, no Abnormal Mass Or lumps Cardiovascular: S1 and S2 Present, no Murmur, Peripheral Pulses Present Respiratory: normal respiratory effort, Bilateral Air entry equal and Decreased, no use of accessory muscle, Clear to Auscultation, no Crackles, no wheezes Abdomen: Bowel Sound present, Soft and no tenderness, no hernia Skin: no redness, no Rash, no  induration Extremities: bilateral  Pedal edema, no calf tenderness Neurologic: Grossly no focal neuro deficit. Bilaterally Equal motor strength with chronic generalized weakness.  Data Reviewed: CBC: Recent Labs  Lab 07/18/18 1048 07/19/18 0245 07/20/18 0616 07/21/18 0427 07/22/18 0430  WBC 7.4 7.7 5.7 5.0 5.5  NEUTROABS 5.3  --   --   --   --   HGB 7.5* 7.7* 7.0* 7.3* 7.6*  HCT 24.0* 25.4* 24.5* 24.2* 24.6*  MCV 87.6 89.1 91.1 90.0 88.2  PLT 408* 394 462* 500* 563*   Basic Metabolic Panel: Recent Labs  Lab 07/17/18 1155 07/18/18 0322 07/19/18 0245 07/20/18 0616 07/21/18 0427 07/22/18 0430  NA 136 138 139 139 136 137  K 3.5 3.8 3.6 3.3* 3.1* 3.9  CL 110 111 111 110 103 106  CO2 20* 21* 22 23 24 24   GLUCOSE 84 101* 86 88 88 92  BUN 6* 5* 6* 5* <5* 5*  CREATININE 0.63 0.69 0.70 0.67 0.64 0.65  CALCIUM 7.8* 7.5* 7.5* 7.7* 7.4* 7.7*  MG 1.7 1.6*  --   --   --   --     Liver Function Tests: No results for input(s): AST, ALT, ALKPHOS, BILITOT, PROT, ALBUMIN in the last 168 hours. No results for input(s): LIPASE, AMYLASE in the last 168 hours. No results for input(s): AMMONIA in the last 168 hours. Coagulation Profile: Recent Labs  Lab 07/18/18 1048  INR 1.34   Cardiac Enzymes: No results for input(s): CKTOTAL, CKMB, CKMBINDEX, TROPONINI in the last 168 hours. BNP (last 3 results) No results for input(s): PROBNP in the last 8760 hours. CBG: No results for input(s): GLUCAP in the last 168 hours. Studies: Dg Chest Port 1 View  Result Date: 07/22/2018 CLINICAL DATA:  Empyema post left chest tube placement 07/08/2018. EXAM: PORTABLE CHEST 1 VIEW COMPARISON:  Radiographs 07/19/2018.  CT 07/16/2018. FINDINGS: Pigtail catheter overlying the lower left chest is unchanged in position. Possible mild re-accumulation of left pleural effusion with stable underlying left pulmonary opacity and central calcifications. There is no pneumothorax. The right lung is clear. A new right  IJ PICC line has been placed, the tip of which is not well visualized although appears to extend to the mid right atrial level. Previous lower cervical fusion noted. IMPRESSION: 1. Interval possible mild re-accumulation of left pleural effusion following pigtail catheter placement in the left pleural space. No pneumothorax. 2. Right-sided PICC line projects to approximately the mid right atrial level. Electronically Signed   By: Carey Bullocks M.D.   On: 07/22/2018 14:44     Time spent: 35 minutes  Author: Lynden Oxford, MD [facility] 07/22/2018 7:24 PM  Between 7PM-7AM, please contact night-coverage at [email or web address]

## 2018-07-22 NOTE — Progress Notes (Signed)
Physical Therapy Treatment Patient Details Name: Wayne Buckley MRN: [id number] DOB: 09/22/1950 Today's Date: 07/22/2018    History of Present Illness 68 y.o. male admitted 07/05/18 from SNF rehab with symptomatic anemia and MRSA bacteremia empyema.  Pt found to have L sided PNA with effusion requiring US guided thoracentesis (07/06/18) and pigtail chest tube placement in IR (07/09/18).  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     PT Comments    Pt with good participation during session today. Wife present to help and encouragement. He required max assist rolling, +2 max assist supine <> sit , +2 max assist sit <> stand in bariatric stedy, and +2 total assist bed to recliner transfer in bariatric stedy. Pt positioned in recliner with feet elevated at end of session with lift sling behind him.    Follow Up Recommendations  SNF     Equipment Recommendations  Hospital bed;Other (comment)(hoyer lift)    Recommendations for Other Services       Precautions / Restrictions Precautions Precautions: Fall Precaution Comments: TBI and old SCI, very weak in his legs    Mobility  Bed Mobility Overal bed mobility: Needs Assistance Bed Mobility: Rolling Rolling: Max assist Sidelying to sit: Max assist;+2 for physical assistance   Sit to supine: (P) Max assist;+2 for physical assistance   General bed mobility comments: cues to reach for rail, +2 max assist to bring BLE off bed and elevate trunk  Transfers Overall transfer level: Needs assistance   Transfers: Sit to/from Stand;Stand Pivot Transfers Sit to Stand: +2 physical assistance;Max assist;From elevated surface Stand pivot transfers: +2 physical assistance;Total assist       General transfer comment: cues for hand placement on stedy, use of bed pad to lift hips for stand. Cues/assist for upright sitting in stedy as pt  has heavy anterior lean over front bar of stedy.  Ambulation/Gait             General Gait Details: unable   Stairs             Wheelchair Mobility    Modified Rankin (Stroke Patients Only)       Balance Overall balance assessment: Needs assistance Sitting-balance support: Feet supported;Bilateral upper extremity supported Sitting balance-[name] Scale: Poor Sitting balance - Comments: max assist to maintain balance EOB   Standing balance support: Bilateral upper extremity supported Standing balance-[name] Scale: Zero Standing balance comment: two person max                            Cognition Arousal/Alertness: Awake/alert Behavior During Therapy: Restless;Flat affect Overall Cognitive Status: History of cognitive impairments - at baseline                                 General Comments: h/o TBI      Exercises      General Comments General comments (skin integrity, edema, etc.): Wife present and assisting during session.       Pertinent Vitals/Pain Pain Assessment: Faces Faces Pain Scale: No hurt Pain Location: (P) "all over", unable to specify  Pain Descriptors / Indicators: (P) Grimacing;Moaning Pain Intervention(s): (P) Monitored during session;Limited activity within patient's tolerance;Repositioned    Home Living Family/patient expects to be discharged to:: Skilled nursing facility  Prior Function Level of Independence: Needs assistance          PT Goals (current goals can now be found in the care plan section) Acute Rehab PT Goals Patient Stated Goal: to get stronger PT Goal Formulation: With patient/family Time For Goal Achievement: 07/24/18 Potential to Achieve Goals: Fair Progress towards PT goals: Progressing toward goals    Frequency    Min 2X/week      PT Plan Current plan remains appropriate    Co-evaluation PT/OT/SLP Co-Evaluation/Treatment: Yes Reason for  Co-Treatment: Complexity of the patient's impairments (multi-system involvement);Necessary to address cognition/behavior during functional activity;For patient/therapist safety;To address functional/ADL transfers PT goals addressed during session: Mobility/safety with mobility;Balance OT goals addressed during session: ADL's and self-care;Strengthening/ROM      AM-PAC PT "6 Clicks" Mobility   Outcome Measure  Help needed turning from your back to your side while in a flat bed without using bedrails?: A Lot Help needed moving from lying on your back to sitting on the side of a flat bed without using bedrails?: Total Help needed moving to and from a bed to a chair (including a wheelchair)?: Total Help needed standing up from a chair using your arms (e.g., wheelchair or bedside chair)?: Total Help needed to walk in hospital room?: Total Help needed climbing 3-5 steps with a railing? : Total 6 Click Score: 7    End of Session   Activity Tolerance: Patient tolerated treatment well Patient left: in chair;with call bell/phone within reach;with family/visitor present;with chair alarm set Nurse Communication: Mobility status;Need for lift equipment PT Visit Diagnosis: Muscle weakness (generalized) (M62.81);Unsteadiness on feet (R26.81);Other abnormalities of gait and mobility (R26.89);Other symptoms and signs involving the nervous system (R29.898)     Time: 1110-1146 PT Time Calculation (min) (ACUTE ONLY): 36 min  Charges:  $Therapeutic Activity: 8-22 mins                     Aida Raider, PT  Office # [phone] Pager [phone]    Ilda Foil 07/22/2018, 12:31 PM

## 2018-07-22 NOTE — Progress Notes (Signed)
Occupational Therapy Treatment Patient Details Name: Wayne Buckley MRN: [id number] DOB: 08/16/1950 Today's Date: 07/22/2018    History of present illness [age] y.o. male admitted 07/05/18 from SNF rehab with symptomatic anemia and MRSA bacteremia empyema.  Pt found to have L sided PNA with effusion requiring US guided thoracentesis (07/06/18) and pigtail chest tube placement in IR (07/09/18).  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    OT comments  Pt slowly progressing towards established OT goals. Pt currently requiring Max A for toilet hygiene at bed level, Max A +2 for bed mobility, and Max A +2 for functional transfers with bari-stedy. Pt requiring increased encouragement and cues. Wife present throughout. Noted additional wound at pt's sacral area; RN made aware and bandage placed. Continue to recommend dc to SNF and will continue to follow acutely as admitted.    Follow Up Recommendations  SNF;Supervision/Assistance - 24 hour    Equipment Recommendations  Other (comment)    Recommendations for Other Services PT consult    Precautions / Restrictions Precautions Precautions: Fall Precaution Comments: TBI and old SCI, very weak in his legs Restrictions Weight Bearing Restrictions: No       Mobility Bed Mobility Overal bed mobility: Needs Assistance Bed Mobility: Rolling;Sidelying to Sit;Sit to Supine Rolling: Max assist(Min A to roll left during cleaning) Sidelying to sit: Max assist;+2 for physical assistance   Sit to supine: Max assist;+2 for physical assistance;HOB elevated   General bed mobility comments: cues to reach for rail, +2 max assist to bring BLE off bed and elevate trunk  Transfers Overall transfer level: Needs assistance   Transfers: Sit to/from Stand;Stand Pivot Transfers Sit to Stand: +2 physical assistance;Max assist;From elevated  surface Stand pivot transfers: +2 physical assistance;Total assist       General transfer comment: cues for hand placement on stedy, use of bed pad to lift hips for stand. Cues/assist for upright sitting in stedy as pt has heavy anterior lean over front bar of stedy.    Balance Overall balance assessment: Needs assistance Sitting-balance support: Feet supported;Bilateral upper extremity supported Sitting balance-[name] Scale: Poor Sitting balance - Comments: max assist to maintain balance EOB Postural control: Left lateral lean Standing balance support: Bilateral upper extremity supported Standing balance-[name] Scale: Zero Standing balance comment: two person max                           ADL either performed or assessed with clinical judgement   ADL Overall ADL's : Needs assistance/impaired                     Lower Body Dressing: Total assistance;Sitting/lateral leans Lower Body Dressing Details (indicate cue type and reason): donned socks at bed level Toilet Transfer: Maximal assistance;+2 for physical assistance(sit<>stand with stedy. ) Toilet Transfer Details (indicate cue type and reason): Max A +2 to power up into standing with use of bed pad to elevate hips. Pt using stedy to pull up into standing and then would lean over stedy rail Toileting- Clothing Manipulation and Hygiene: Maximal assistance;+2 for physical assistance;Bed level Toileting - Clothing Manipulation Details (indicate cue type and reason): Max A for toilet hygiene after bowel incontinience in bed. +2 for rolling and then cleaning     Functional mobility during ADLs: Maximal assistance;+2 for physical assistance(stedy) General ADL Comments: Pt continues to require Max encouragement for participation.  Once OOB, pt is very happy to be in the chair. Pt with bowel incontience and required Max A +2 for bedlevel cleaning     Vision       Perception     Praxis      Cognition  Arousal/Alertness: Awake/alert Behavior During Therapy: Restless;Flat affect Overall Cognitive Status: History of cognitive impairments - at baseline                                 General Comments: h/o TBI        Exercises     Shoulder Instructions       General Comments Wife present and assisting during session.     Pertinent Vitals/ Pain       Pain Assessment: Faces Faces Pain Scale: No hurt Pain Location: "all over", unable to specify  Pain Descriptors / Indicators: Grimacing;Moaning Pain Intervention(s): Monitored during session;Limited activity within patient's tolerance;Repositioned  Home Living Family/patient expects to be discharged to:: Skilled nursing facility                                        Prior Functioning/Environment Level of Independence: Needs assistance            Frequency  Min 2X/week        Progress Toward Goals  OT Goals(current goals can now be found in the care plan section)  Progress towards OT goals: Progressing toward goals  Acute Rehab OT Goals Patient Stated Goal: to get stronger(1110) OT Goal Formulation: With patient/family Time For Goal Achievement: 07/24/18 Potential to Achieve Goals: Good ADL Goals Pt Will Perform Eating: with set-up Pt Will Perform Grooming: with set-up Pt Will Transfer to Toilet: with mod assist Additional ADL Goal #1: Pt will follow two step commands 3x during ADL completion.  Plan Discharge plan remains appropriate    Co-evaluation    PT/OT/SLP Co-Evaluation/Treatment: Yes Reason for Co-Treatment: Complexity of the patient's impairments (multi-system involvement);For patient/therapist safety;To address functional/ADL transfers PT goals addressed during session: Mobility/safety with mobility;Balance OT goals addressed during session: ADL's and self-care      AM-PAC OT "6 Clicks" Daily Activity     Outcome Measure   Help from another person eating meals?:  A Lot Help from another person taking care of personal grooming?: A Lot Help from another person toileting, which includes using toliet, bedpan, or urinal?: Total Help from another person bathing (including washing, rinsing, drying)?: A Lot Help from another person to put on and taking off regular upper body clothing?: A Lot Help from another person to put on and taking off regular lower body clothing?: Total 6 Click Score: 10    End of Session Equipment Utilized During Treatment: Gait belt;Other (comment)(sara stedy)  OT Visit Diagnosis: Unsteadiness on feet (R26.81);Other abnormalities of gait and mobility (R26.89);Muscle weakness (generalized) (M62.81);History of falling (Z91.81);Other symptoms and signs involving cognitive function;Pain Pain - part of body: (Generalized)   Activity Tolerance Patient limited by fatigue   Patient Left in chair;with call bell/phone within reach;with chair alarm set;with nursing/sitter in room   Nurse Communication Need for lift equipment        Time: 1110-1147 OT Time Calculation (min): 37 min  Charges: OT General Charges $OT Visit: 1 Visit OT Treatments $Self Care/Home Management : 8-22 mins  Gabi Mcfate MSOT, OTR/L Acute Rehab Pager:  [phone] Office: [phone]   Theodoro Grist Sricharan Lacomb 07/22/2018, 12:46 PM

## 2018-07-22 NOTE — Progress Notes (Signed)
Supervising Physician: Simonne Come  Patient Status:  [facility] - In-pt  Chief Complaint: Empyema, s/p chest tube placement 1/20  Subjective: Patient sitting up in chair.  No complaints.   Allergies: Patient has no known allergies.  Medications: Prior to Admission medications   Medication Sig Start Date End Date Taking? Authorizing Provider  acetaminophen (TYLENOL) 325 MG tablet Take 2 tablets (650 mg total) by mouth every 6 (six) hours as needed for mild pain. 04/03/18  Yes Angiulli, Mcarthur Rossetti, PA-C  amLODipine (NORVASC) 5 MG tablet Take 5 mg by mouth daily.   Yes [provider]  apixaban (ELIQUIS) 5 MG TABS tablet Take 1 tablet (5 mg total) by mouth 2 (two) times daily. 04/03/18  Yes Angiulli, Mcarthur Rossetti, PA-C  bisacodyl (DULCOLAX) 10 MG suppository Place 1 suppository (10 mg total) rectally daily at 6 (six) AM. Patient taking differently: Place 10 mg rectally daily as needed for mild constipation.  04/03/18  Yes Angiulli, Mcarthur Rossetti, PA-C  carvedilol (COREG) 12.5 MG tablet Take 1 tablet (12.5 mg total) by mouth 2 (two) times daily with a meal. 04/03/18  Yes Angiulli, Mcarthur Rossetti, PA-C  ferrous sulfate 325 (65 FE) MG tablet Take 1 tablet (325 mg total) by mouth 2 (two) times daily with a meal. 05/31/18  Yes Calvert Cantor, MD  liver oil-zinc oxide (DESITIN) 40 % ointment Apply topically 4 (four) times daily. Apply to sacrum, coccyx, buttocks. Patient taking differently: Apply 1 application topically 4 (four) times daily. Apply to sacrum, coccyx, buttocks. 05/14/18  Yes Rai, Ripudeep K, MD  polyethylene glycol (MIRALAX / GLYCOLAX) packet Take 17 g by mouth daily as needed for mild constipation or moderate constipation. 05/31/18  Yes Rizwan, Ladell Heads, MD  senna-docusate (SENOKOT-S) 8.6-50 MG tablet Take 1 tablet by mouth 2 (two) times daily. Patient taking differently: Take 1 tablet by mouth 2 (two) times daily as needed (constipation).  05/14/18  Yes Rai, Ripudeep K, MD  sodium phosphate  (FLEET) 7-19 GM/118ML ENEM Place 133 mLs (1 enema total) rectally daily as needed for severe constipation. 05/31/18  Yes Calvert Cantor, MD  tamsulosin (FLOMAX) 0.4 MG CAPS capsule Take 1 capsule (0.4 mg total) by mouth daily. 03/06/18  Yes Leroy Sea, MD  vitamin C (VITAMIN C) 1000 MG tablet Take 1 tablet (1,000 mg total) by mouth 2 (two) times daily. 04/03/18  Yes Angiulli, Mcarthur Rossetti, PA-C  amLODipine (NORVASC) 10 MG tablet Take 1 tablet (10 mg total) by mouth daily. Patient not taking: Reported on 07/05/2018 03/06/18   Leroy Sea, MD  collagenase (SANTYL) ointment Apply topically daily. Cleanse gluteal wounds with NS.  Apply Santyl to open areas.  Cover with NS moist 2x2 and foam dressings.  Peel back foam and change daily. Foam is changed every three days and PRN soilage. Patient not taking: Reported on 07/05/2018 05/15/18   Rai, Delene Ruffini, MD  levofloxacin (LEVAQUIN) 750 MG tablet Take 750 mg by mouth daily. For 7 days. Start date 06/25/2018. End date 07/01/2018    [provider]  polyethylene glycol (MIRALAX / GLYCOLAX) packet Take 17 g by mouth 2 (two) times daily. Patient not taking: Reported on 07/05/2018 05/14/18   Cathren Harsh, MD     Vital Signs: BP 131/80 (BP Location: Left Arm)   Pulse 88   Temp 97.7 F (36.5 C) (Oral)   Resp 18   Ht 5\' 9"  (1.753 m)   Wt 174 lb 6.1 oz (79.1 kg)   SpO2 98%  BMI 25.75 kg/m   Physical Exam  NAD, comfortable Chest: L drain in place.  No air leak.  Output serous Skin: Site intact. Dressing remains in place.   Imaging: Ir Fluoro Guide Cv Line Right  Result Date: 07/19/2018 INDICATION: Pneumonia EXAM: TUNNELED RIGHT JUGULAR PICC LINE PLACEMENT WITH ULTRASOUND AND FLUOROSCOPIC GUIDANCE MEDICATIONS: None ANESTHESIA/SEDATION: None FLUOROSCOPY TIME:  Fluoroscopy Time:  minutes 6 seconds (0.3 mGy). COMPLICATIONS: None immediate. PROCEDURE: The patient was advised of the possible risks and complications and agreed to undergo the  procedure. The patient was then brought to the angiographic suite for the procedure. The right neck was prepped with chlorhexidine, draped in the usual sterile fashion using maximum barrier technique (cap and mask, sterile gown, sterile gloves, large sterile sheet, hand hygiene and cutaneous antiseptic). Local anesthesia was attained by infiltration with 1% lidocaine. Ultrasound demonstrated patency of the right jugular vein, and this was documented with an image. Under real-time ultrasound guidance, this vein was accessed with a 21 gauge micropuncture needle and image documentation was performed. The needle was exchanged over a guidewire for a peel-away sheath through which a 23 cm 5 [place] double lumen power injectable PICC was advanced, and positioned with its tip at the lower SVC/right atrial junction. The cuff was positioned in the subcutaneous tract. Fluoroscopy during the procedure and fluoro spot radiograph confirms appropriate catheter position. The catheter was flushed, secured to the skin with Prolene sutures, and covered with a sterile dressing. IMPRESSION: Successful placement of a tunneled right jugular PICC with sonographic and fluoroscopic guidance. The catheter is ready for use. Electronically Signed   By: Jolaine ClickArthur  Hoss M.D.   On: 07/19/2018 13:11   Ir [place] Guide Vasc Access Right  Result Date: 07/19/2018 INDICATION: Pneumonia EXAM: TUNNELED RIGHT JUGULAR PICC LINE PLACEMENT WITH ULTRASOUND AND FLUOROSCOPIC GUIDANCE MEDICATIONS: None ANESTHESIA/SEDATION: None FLUOROSCOPY TIME:  Fluoroscopy Time:  minutes 6 seconds (0.3 mGy). COMPLICATIONS: None immediate. PROCEDURE: The patient was advised of the possible risks and complications and agreed to undergo the procedure. The patient was then brought to the angiographic suite for the procedure. The right neck was prepped with chlorhexidine, draped in the usual sterile fashion using maximum barrier technique (cap and mask, sterile gown, sterile gloves,  large sterile sheet, hand hygiene and cutaneous antiseptic). Local anesthesia was attained by infiltration with 1% lidocaine. Ultrasound demonstrated patency of the right jugular vein, and this was documented with an image. Under real-time ultrasound guidance, this vein was accessed with a 21 gauge micropuncture needle and image documentation was performed. The needle was exchanged over a guidewire for a peel-away sheath through which a 23 cm 5 [place] double lumen power injectable PICC was advanced, and positioned with its tip at the lower SVC/right atrial junction. The cuff was positioned in the subcutaneous tract. Fluoroscopy during the procedure and fluoro spot radiograph confirms appropriate catheter position. The catheter was flushed, secured to the skin with Prolene sutures, and covered with a sterile dressing. IMPRESSION: Successful placement of a tunneled right jugular PICC with sonographic and fluoroscopic guidance. The catheter is ready for use. Electronically Signed   By: Jolaine ClickArthur  Hoss M.D.   On: 07/19/2018 13:11   Dg Chest Port 1 View  Result Date: 07/19/2018 CLINICAL DATA:  Pleural effusion. EXAM: PORTABLE CHEST 1 VIEW COMPARISON:  CT 07/16/2017.  Chest x-ray 07/13/2017. FINDINGS: Left chest tube in stable position. Significant improvement in left-sided pleural effusion. Left mid and lower lung atelectatic changes again noted. No pneumothorax. Heart size stable. Prior cervical  spine fusion. IMPRESSION: 1. Scratch left chest tube in stable position. Significant improvement in left-sided pleural effusion. 2.  Left mid lung and lower lung atelectatic changes again noted. Electronically Signed   By: Maisie Fushomas  Register   On: 07/19/2018 06:08    Labs:  CBC: Recent Labs    07/19/18 0245 07/20/18 0616 07/21/18 0427 07/22/18 0430  WBC 7.7 5.7 5.0 5.5  HGB 7.7* 7.0* 7.3* 7.6*  HCT 25.4* 24.5* 24.2* 24.6*  PLT 394 462* 500* 563*    COAGS: Recent Labs    02/25/18 1413  05/10/18 0642  05/11/18 0615 05/12/18 0628 05/13/18 0503 07/05/18 1918 07/11/18 1113 07/18/18 1048  INR 1.16  --   --   --   --   --  1.89 1.34 1.34  APTT  --    < > 54* 53* 51* 54*  --   --   --    < > = values in this interval not displayed.    BMP: Recent Labs    07/19/18 0245 07/20/18 0616 07/21/18 0427 07/22/18 0430  NA 139 139 136 137  K 3.6 3.3* 3.1* 3.9  CL 111 110 103 106  CO2 22 23 24 24   GLUCOSE 86 88 88 92  BUN 6* 5* <5* 5*  CALCIUM 7.5* 7.7* 7.4* 7.7*  CREATININE 0.70 0.67 0.64 0.65  GFRNONAA >60 >60 >60 >60  GFRAA >60 >60 >60 >60    LIVER FUNCTION TESTS: Recent Labs    05/17/18 0659 07/05/18 1918 07/07/18 0835 07/09/18 1725  BILITOT 0.5 0.7 0.4 0.5  AST 20 26 19 17   ALT 16 11 9 8   ALKPHOS 78 96 97 96  PROT 6.2* 6.3* 5.5* 5.8*  ALBUMIN 2.0* 1.8* 1.4* 1.5*    Assessment and Plan: MRSA bacteremia empyema s/p chest tube placement 1/20 Output trending down to <200 mL per day.  Output serous without purulence.  Ok to pull tube per CCM. Discussed with Dr. Grace IsaacWatts.  Repeat CXR today and plan to pull once reviewed. This has been ordered.   Electronically Signed: Hoyt KochKacie Sue-Ellen Darlinda Bellows, PA 07/22/2018, 12:53 PM   I spent a total of 15 Minutes at the the patient's bedside AND on the patient's hospital floor or unit, greater than 50% of which was counseling/coordinating care for empyema.

## 2018-07-23 DIAGNOSIS — D649 Anemia, unspecified: Secondary | ICD-10-CM

## 2018-07-23 NOTE — Progress Notes (Signed)
Supervising Physician: Gilmer MorWagner, Jaime  Patient Status:  [facility] - In-pt  Chief Complaint: Empyema, s/p chest tube placement 1/20  Subjective: "I just want to sleep."   Allergies: Patient has no known allergies.  Medications: Prior to Admission medications   Medication Sig Start Date End Date Taking? Authorizing Provider  acetaminophen (TYLENOL) 325 MG tablet Take 2 tablets (650 mg total) by mouth every 6 (six) hours as needed for mild pain. 04/03/18  Yes Angiulli, Mcarthur Rossettianiel J, PA-C  amLODipine (NORVASC) 5 MG tablet Take 5 mg by mouth daily.   Yes [provider]  apixaban (ELIQUIS) 5 MG TABS tablet Take 1 tablet (5 mg total) by mouth 2 (two) times daily. 04/03/18  Yes Angiulli, Mcarthur Rossettianiel J, PA-C  bisacodyl (DULCOLAX) 10 MG suppository Place 1 suppository (10 mg total) rectally daily at 6 (six) AM. Patient taking differently: Place 10 mg rectally daily as needed for mild constipation.  04/03/18  Yes Angiulli, Mcarthur Rossettianiel J, PA-C  carvedilol (COREG) 12.5 MG tablet Take 1 tablet (12.5 mg total) by mouth 2 (two) times daily with a meal. 04/03/18  Yes Angiulli, Mcarthur Rossettianiel J, PA-C  ferrous sulfate 325 (65 FE) MG tablet Take 1 tablet (325 mg total) by mouth 2 (two) times daily with a meal. 05/31/18  Yes Calvert Cantorizwan, Saima, MD  liver oil-zinc oxide (DESITIN) 40 % ointment Apply topically 4 (four) times daily. Apply to sacrum, coccyx, buttocks. Patient taking differently: Apply 1 application topically 4 (four) times daily. Apply to sacrum, coccyx, buttocks. 05/14/18  Yes Rai, Ripudeep K, MD  polyethylene glycol (MIRALAX / GLYCOLAX) packet Take 17 g by mouth daily as needed for mild constipation or moderate constipation. 05/31/18  Yes Rizwan, Ladell HeadsSaima, MD  senna-docusate (SENOKOT-S) 8.6-50 MG tablet Take 1 tablet by mouth 2 (two) times daily. Patient taking differently: Take 1 tablet by mouth 2 (two) times daily as needed (constipation).  05/14/18  Yes Rai, Ripudeep K, MD  sodium phosphate (FLEET) 7-19 GM/118ML  ENEM Place 133 mLs (1 enema total) rectally daily as needed for severe constipation. 05/31/18  Yes Calvert Cantorizwan, Saima, MD  tamsulosin (FLOMAX) 0.4 MG CAPS capsule Take 1 capsule (0.4 mg total) by mouth daily. 03/06/18  Yes Leroy SeaSingh, Prashant K, MD  vitamin C (VITAMIN C) 1000 MG tablet Take 1 tablet (1,000 mg total) by mouth 2 (two) times daily. 04/03/18  Yes Angiulli, Mcarthur Rossettianiel J, PA-C  amLODipine (NORVASC) 10 MG tablet Take 1 tablet (10 mg total) by mouth daily. Patient not taking: Reported on 07/05/2018 03/06/18   Leroy SeaSingh, Prashant K, MD  collagenase (SANTYL) ointment Apply topically daily. Cleanse gluteal wounds with NS.  Apply Santyl to open areas.  Cover with NS moist 2x2 and foam dressings.  Peel back foam and change daily. Foam is changed every three days and PRN soilage. Patient not taking: Reported on 07/05/2018 05/15/18   Rai, Delene Ruffiniipudeep K, MD  levofloxacin (LEVAQUIN) 750 MG tablet Take 750 mg by mouth daily. For 7 days. Start date 06/25/2018. End date 07/01/2018    [provider]  polyethylene glycol (MIRALAX / GLYCOLAX) packet Take 17 g by mouth 2 (two) times daily. Patient not taking: Reported on 07/05/2018 05/14/18   Cathren Harshai, Ripudeep K, MD     Vital Signs: BP 118/69 (BP Location: Right Arm)   Pulse 85   Temp 97.8 F (36.6 C)   Resp 19   Ht 5\' 9"  (1.753 m)   Wt 174 lb 6.1 oz (79.1 kg)   SpO2 99%   BMI 25.75 kg/m  Physical Exam  NAD, comfortable Chest: L drain in place.  No air leak.  Output serous Skin: Site intact. Dressing remains in place.   Imaging: Ir Fluoro Guide Cv Line Right  Result Date: 07/19/2018 INDICATION: Pneumonia EXAM: TUNNELED RIGHT JUGULAR PICC LINE PLACEMENT WITH ULTRASOUND AND FLUOROSCOPIC GUIDANCE MEDICATIONS: None ANESTHESIA/SEDATION: None FLUOROSCOPY TIME:  Fluoroscopy Time:  minutes 6 seconds (0.3 mGy). COMPLICATIONS: None immediate. PROCEDURE: The patient was advised of the possible risks and complications and agreed to undergo the procedure. The patient was  then brought to the angiographic suite for the procedure. The right neck was prepped with chlorhexidine, draped in the usual sterile fashion using maximum barrier technique (cap and mask, sterile gown, sterile gloves, large sterile sheet, hand hygiene and cutaneous antiseptic). Local anesthesia was attained by infiltration with 1% lidocaine. Ultrasound demonstrated patency of the right jugular vein, and this was documented with an image. Under real-time ultrasound guidance, this vein was accessed with a 21 gauge micropuncture needle and image documentation was performed. The needle was exchanged over a guidewire for a peel-away sheath through which a 23 cm 5 [place] double lumen power injectable PICC was advanced, and positioned with its tip at the lower SVC/right atrial junction. The cuff was positioned in the subcutaneous tract. Fluoroscopy during the procedure and fluoro spot radiograph confirms appropriate catheter position. The catheter was flushed, secured to the skin with Prolene sutures, and covered with a sterile dressing. IMPRESSION: Successful placement of a tunneled right jugular PICC with sonographic and fluoroscopic guidance. The catheter is ready for use. Electronically Signed   By: Jolaine Click M.D.   On: 07/19/2018 13:11   Ir US Guide Vasc Access Right  Result Date: 07/19/2018 INDICATION: Pneumonia EXAM: TUNNELED RIGHT JUGULAR PICC LINE PLACEMENT WITH ULTRASOUND AND FLUOROSCOPIC GUIDANCE MEDICATIONS: None ANESTHESIA/SEDATION: None FLUOROSCOPY TIME:  Fluoroscopy Time:  minutes 6 seconds (0.3 mGy). COMPLICATIONS: None immediate. PROCEDURE: The patient was advised of the possible risks and complications and agreed to undergo the procedure. The patient was then brought to the angiographic suite for the procedure. The right neck was prepped with chlorhexidine, draped in the usual sterile fashion using maximum barrier technique (cap and mask, sterile gown, sterile gloves, large sterile sheet, hand  hygiene and cutaneous antiseptic). Local anesthesia was attained by infiltration with 1% lidocaine. Ultrasound demonstrated patency of the right jugular vein, and this was documented with an image. Under real-time ultrasound guidance, this vein was accessed with a 21 gauge micropuncture needle and image documentation was performed. The needle was exchanged over a guidewire for a peel-away sheath through which a 23 cm 5 [place] double lumen power injectable PICC was advanced, and positioned with its tip at the lower SVC/right atrial junction. The cuff was positioned in the subcutaneous tract. Fluoroscopy during the procedure and fluoro spot radiograph confirms appropriate catheter position. The catheter was flushed, secured to the skin with Prolene sutures, and covered with a sterile dressing. IMPRESSION: Successful placement of a tunneled right jugular PICC with sonographic and fluoroscopic guidance. The catheter is ready for use. Electronically Signed   By: Jolaine Click M.D.   On: 07/19/2018 13:11   Dg Chest Port 1 View  Result Date: 07/22/2018 CLINICAL DATA:  Empyema post left chest tube placement 07/08/2018. EXAM: PORTABLE CHEST 1 VIEW COMPARISON:  Radiographs 07/19/2018.  CT 07/16/2018. FINDINGS: Pigtail catheter overlying the lower left chest is unchanged in position. Possible mild re-accumulation of left pleural effusion with stable underlying left pulmonary opacity and central calcifications. There is  no pneumothorax. The right lung is clear. A new right IJ PICC line has been placed, the tip of which is not well visualized although appears to extend to the mid right atrial level. Previous lower cervical fusion noted. IMPRESSION: 1. Interval possible mild re-accumulation of left pleural effusion following pigtail catheter placement in the left pleural space. No pneumothorax. 2. Right-sided PICC line projects to approximately the mid right atrial level. Electronically Signed   By: Carey BullocksWilliam  Veazey M.D.   On:  07/22/2018 14:44    Labs:  CBC: Recent Labs    07/19/18 0245 07/20/18 0616 07/21/18 0427 07/22/18 0430  WBC 7.7 5.7 5.0 5.5  HGB 7.7* 7.0* 7.3* 7.6*  HCT 25.4* 24.5* 24.2* 24.6*  PLT 394 462* 500* 563*    COAGS: Recent Labs    02/25/18 1413  05/10/18 0642 05/11/18 0615 05/12/18 0628 05/13/18 0503 07/05/18 1918 07/11/18 1113 07/18/18 1048  INR 1.16  --   --   --   --   --  1.89 1.34 1.34  APTT  --    < > 54* 53* 51* 54*  --   --   --    < > = values in this interval not displayed.    BMP: Recent Labs    07/19/18 0245 07/20/18 0616 07/21/18 0427 07/22/18 0430  NA 139 139 136 137  K 3.6 3.3* 3.1* 3.9  CL 111 110 103 106  CO2 22 23 24 24   GLUCOSE 86 88 88 92  BUN 6* 5* <5* 5*  CALCIUM 7.5* 7.7* 7.4* 7.7*  CREATININE 0.70 0.67 0.64 0.65  GFRNONAA >60 >60 >60 >60  GFRAA >60 >60 >60 >60    LIVER FUNCTION TESTS: Recent Labs    05/17/18 0659 07/05/18 1918 07/07/18 0835 07/09/18 1725  BILITOT 0.5 0.7 0.4 0.5  AST 20 26 19 17   ALT 16 11 9 8   ALKPHOS 78 96 97 96  PROT 6.2* 6.3* 5.5* 5.8*  ALBUMIN 2.0* 1.8* 1.4* 1.5*    Assessment and Plan: MRSA bacteremia, empyema s/p chest tube placement 1/20 Documented output of 130 mL in the past 24 hrs. Output remains serous without purulence.  CXR today discussed with Dr. Loreta AveWagner. Overall stable.   Discussed with CCM.  Plan to place to water seal today.   Discussed with RN who will assist with strict documentation.  Pleurvac has 1090 mL at time of PA visit. Discussed with TRH; ultimate plan is to discharge to rehab once stable.   Electronically Signed: Hoyt KochKacie Sue-Ellen Piotr Christopher, PA 07/23/2018, 1:01 PM   I spent a total of 15 Minutes at the the patient's bedside AND on the patient's hospital floor or unit, greater than 50% of which was counseling/coordinating care for empyema.

## 2018-07-23 NOTE — Care Management Important Message (Signed)
Important Message  Patient Details  Name: Wayne Buckley MRN: [id number] Date of Birth: 12/19/1950   Medicare Important Message Given:  Yes    Leone Havenaylor, Lakya Schrupp [place], RN 07/23/2018, 2:42 PM

## 2018-07-23 NOTE — Progress Notes (Signed)
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  Was subsequently found to have recurrent MRSA bacteremia with left-sided pneumonia, loculated parapneumonic effusion, thoracic and lumbar spine discitis.  He underwent CT-guided chest tube placement and is followed by pulmonology and interventional radiology.  Given abnormal MRI findings, he has been followed by neurosurgery.  Neurosurgery recommended long-term IV antibiotics, no invasive intervention.  Pulmonology following chest tube and loculated pleural effusion.  Should be able to go to SNF when chest tube has been removed.  Continue long-term IV antibiotics.  A & P  Recurrent MRSA bacteremia with left-sided pneumonia with loculated left effusion/empyema, acute hypoxic respiratory failure 1/24.  Status post lytics 1/24. Respiratory status remained stable.  Follow-up chest CT showed improvement but still significant loculated fluid. Appreciate CCM management Significant improvement with TPA instillation.  No further TPA. Chest tube output is currently reducing. IR following. Plan is to remove the tube from suctioning and repeat a chest x-ray tomorrow and monitor for reaccumulation. Repeat CT scan down the road into 3-4 weeks.  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  Not a candidate for TEE in the past secondary to possible esophageal obstruction. Repeat blood cultures no growth, final Continue prolonged IV antibiotics as recommended by infectious disease, 8-10 weeks with  repeat imaging in 6-8 weeks. Tunnel PICC given involuntary movements in arms planned in near future (was canceled 1/24 given patient instability). Neurosurgery recommended medical management and there are no plans for surgical intervention. Repeat imaging once antibiotics are completed.  PMH traumatic brain injury, PMH C5-C7 spinal cord injury, chronic involuntary movements of the extremities, chronic urinary retention with chronic indwelling Foley catheter  Essential hypertension Remains stable, continue carvedilol  Iron deficiency anemia.   Continue iron supplementation.  Status post IV iron.  Status post transfusion 3 units PRBC. Hemoglobin dropped again.  Now remained stable. Work-up for hemolysis is unremarkable. We will continue to monitor H&H.  PMH DVT Patient was on therapeutic Lovenox.  Will transition to Eliquis.  Unstageable pressure injury of the bilateral buttocks present on admission Continue wound care   Pressure Injury 07/10/18 Stage II -  Partial thickness loss of dermis presenting as a shallow open ulcer with a red, pink wound bed without slough. sacrum (Active)  07/10/18 1930  Location: Buttocks  Location Orientation: Left;Right  Staging: Stage II -  Partial thickness loss of dermis presenting as a shallow open ulcer with a red, pink wound bed without slough.  Wound Description (Comments): sacrum  Present on Admission:     PMH TBI, cord compression.   Resident in SNF.   Chronic Foley for neurogenic bladder.   Continue IV antibiotic.  Follow-up pulmonology recommendations.  Will remain hospitalized until able to remove chest tube.  DVT prophylaxis: Eliquis. Code Status: Full Family Communication: Discussed with wife at bedside Disposition Plan: SNF   Consultants  . ID . Neurosurgery  . IR . Pulmonology  Procedures  . Diagnostic left thoracentesis . CT-guided left chest tube placement 1/20 . MRI under anesthesia . Echo   Scheduled Meds: .  apixaban  5 mg Oral BID  . carvedilol  6.25 mg Oral BID WC  . collagenase   Topical Daily  . ferrous sulfate  325 mg Oral BID WC  . polyethylene glycol  17 g Oral BID  . senna  1 tablet Oral QHS  . tamsulosin  0.4 mg Oral Daily   Continuous Infusions: . lactated ringers 10 mL/hr at 07/16/18 0445  . vancomycin 1,000 mg (07/23/18 0906)   PRN Meds: acetaminophen, HYDROcodone-acetaminophen, lidocaine (PF) Antibiotics: Anti-infectives (From admission, onward)   Start     Dose/Rate Route Frequency Ordered Stop   07/12/18 0600  vancomycin (VANCOCIN) IVPB 1000 mg/200 mL premix     1,000 mg 200 mL/hr over 60 Minutes Intravenous Every 24 hours 07/11/18 1708     07/08/18 1800  vancomycin (VANCOCIN) IVPB 750 mg/150 ml premix  Status:  Discontinued     750 mg 150 mL/hr over 60 Minutes Intravenous Every 12 hours 07/07/18 1219 07/11/18 1708   07/06/18 1200  vancomycin (VANCOCIN) IVPB 1000 mg/200 mL premix  Status:  Discontinued     1,000 mg 200 mL/hr over 60 Minutes Intravenous Every 12 hours 07/06/18 1014 07/07/18 1219   07/06/18 1000  vancomycin (VANCOCIN) IVPB 1000 mg/200 mL premix  Status:  Discontinued     1,000 mg 200 mL/hr over 60 Minutes Intravenous Every 12 hours 07/06/18 0122 07/06/18 1006   07/06/18 1000  ceFEPIme (MAXIPIME) 2 g in sodium chloride 0.9 % 100 mL IVPB  Status:  Discontinued     2 g 200 mL/hr over 30 Minutes Intravenous Every 12 hours 07/06/18 0122 07/06/18 1756   07/05/18 2300  ceFEPIme (MAXIPIME) 2 g in sodium chloride 0.9 % 100 mL IVPB     2 g 200 mL/hr over 30 Minutes Intravenous  Once 07/05/18 2245 07/06/18 0014   07/05/18 2300  metroNIDAZOLE (FLAGYL) IVPB 500 mg  Status:  Discontinued     500 mg 100 mL/hr over 60 Minutes Intravenous Every 8 hours 07/05/18 2245 07/06/18 1006   07/05/18 2300  vancomycin (VANCOCIN) IVPB 1000 mg/200 mL premix  Status:  Discontinued     1,000 mg 200 mL/hr over 60 Minutes Intravenous  Once 07/05/18 2245 07/05/18 2249   07/05/18 2300   vancomycin (VANCOCIN) 2,000 mg in sodium chloride 0.9 % 500 mL IVPB     2,000 mg 250 mL/hr over 120 Minutes Intravenous  Once 07/05/18 2249 07/06/18 0259   07/05/18 2030  ceFEPIme (MAXIPIME) 2 g in sodium chloride 0.9 % 100 mL IVPB  Status:  Discontinued     2 g 200 mL/hr over 30 Minutes Intravenous  Once 07/05/18 2019 07/05/18 2022   07/05/18 2030  metroNIDAZOLE (FLAGYL) IVPB 500 mg  Status:  Discontinued     500 mg 100 mL/hr over 60 Minutes Intravenous Every 8 hours 07/05/18 2019 07/05/18 2022   07/05/18 2030  vancomycin (VANCOCIN) IVPB 1000 mg/200 mL premix  Status:  Discontinued     1,000 mg 200 mL/hr over 60 Minutes Intravenous  Once 07/05/18 2019 07/05/18 2022       Objective: Physical Exam: Vitals:   07/22/18 2100 07/22/18 2311 07/23/18 0808 07/23/18 1741  BP:  128/78 118/69 113/80  Pulse:  65 85 79  Resp:  19    Temp: 97.9 F (36.6 C) 98 F (36.7 C) 97.8 F (36.6 C) 98.3 F (36.8 C)  TempSrc:  Oral  Oral  SpO2:  94% 99% 96%  Weight:      Height:  Intake/Output Summary (Last 24 hours) at 07/23/2018 1743 Last data filed at 07/23/2018 0600 Gross per 24 hour  Intake 200 ml  Output 1480 ml  Net -1280 ml   Filed Weights   07/05/18 1900 07/06/18 0139  Weight: 114.8 kg 79.1 kg   General: Alert, Awake and Oriented to Time, Place and Person. Appear in mild distress, affect appropriate Eyes: PERRL, Conjunctiva normal ENT: Oral Mucosa clear moist. Neck: difficult to assess  JVD, no Abnormal Mass Or lumps Cardiovascular: S1 and S2 Present, no Murmur, Peripheral Pulses Present Respiratory: normal respiratory effort, Bilateral Air entry equal and Decreased, no use of accessory muscle, Clear to Auscultation, no Crackles, no wheezes Abdomen: Bowel Sound present, Soft and no tenderness, no hernia Skin: no redness, no Rash, no induration Extremities: bilateral  Pedal edema, no calf tenderness Neurologic: Grossly no focal neuro deficit. Bilaterally Equal motor strength with  chronic generalized weakness.  Data Reviewed: CBC: Recent Labs  Lab 07/18/18 1048 07/19/18 0245 07/20/18 0616 07/21/18 0427 07/22/18 0430  WBC 7.4 7.7 5.7 5.0 5.5  NEUTROABS 5.3  --   --   --   --   HGB 7.5* 7.7* 7.0* 7.3* 7.6*  HCT 24.0* 25.4* 24.5* 24.2* 24.6*  MCV 87.6 89.1 91.1 90.0 88.2  PLT 408* 394 462* 500* 563*   Basic Metabolic Panel: Recent Labs  Lab 07/17/18 1155 07/18/18 0322 07/19/18 0245 07/20/18 0616 07/21/18 0427 07/22/18 0430  NA 136 138 139 139 136 137  K 3.5 3.8 3.6 3.3* 3.1* 3.9  CL 110 111 111 110 103 106  CO2 20* 21* 22 23 24 24   GLUCOSE 84 101* 86 88 88 92  BUN 6* 5* 6* 5* <5* 5*  CREATININE 0.63 0.69 0.70 0.67 0.64 0.65  CALCIUM 7.8* 7.5* 7.5* 7.7* 7.4* 7.7*  MG 1.7 1.6*  --   --   --   --     Liver Function Tests: No results for input(s): AST, ALT, ALKPHOS, BILITOT, PROT, ALBUMIN in the last 168 hours. No results for input(s): LIPASE, AMYLASE in the last 168 hours. No results for input(s): AMMONIA in the last 168 hours. Coagulation Profile: Recent Labs  Lab 07/18/18 1048  INR 1.34   Cardiac Enzymes: No results for input(s): CKTOTAL, CKMB, CKMBINDEX, TROPONINI in the last 168 hours. BNP (last 3 results) No results for input(s): PROBNP in the last 8760 hours. CBG: No results for input(s): GLUCAP in the last 168 hours. Studies: No results found.   Time spent: 35 minutes  Author: Lynden Oxford, MD [facility] 07/23/2018 5:42 PM  Between 7PM-7AM, please contact night-coverage at [email or web address]

## 2018-07-23 NOTE — Progress Notes (Signed)
ANTICOAGULATION CONSULT NOTE - follow up Pharmacy Consult for Apixaban  Indication: DVT  No Known Allergies  Patient Measurements: Height: 5\' 9"  (175.3 cm) Weight: 174 lb 6.1 oz (79.1 kg) IBW/kg (Calculated) : 70.7  Vital Signs: Temp: 98 F (36.7 C) (02/03 2311) Temp Source: Oral (02/03 2311) BP: 128/78 (02/03 2311) Pulse Rate: 65 (02/03 2311)  Labs: Recent Labs    07/21/18 0427 07/22/18 0430  HGB 7.3* 7.6*  HCT 24.2* 24.6*  PLT 500* 563*  CREATININE 0.64 0.65    Estimated Creatinine Clearance: 88.4 mL/min (by C-G formula based on SCr of 0.65 mg/dL).   Medical History: Past Medical History:  Diagnosis Date  . BPH (benign prostatic hyperplasia)   . DVT (deep venous thrombosis) (HCC) 02/2018   extensive LLE DVT  . H/O alcohol abuse   . Hypertension   . Neurogenic bowel   . Spinal cord injury, cervical region (HCC) 02/2018  . TBI (traumatic brain injury) (HCC) 2013   SDH with left partial frontal lobectomy    Medications:  Medications Prior to Admission  Medication Sig Dispense Refill Last Dose  . acetaminophen (TYLENOL) 325 MG tablet Take 2 tablets (650 mg total) by mouth every 6 (six) hours as needed for mild pain.   Past Month at Unknown time  . amLODipine (NORVASC) 5 MG tablet Take 5 mg by mouth daily.   07/05/2018 at    . apixaban (ELIQUIS) 5 MG TABS tablet Take 1 tablet (5 mg total) by mouth 2 (two) times daily. 60 tablet  07/05/2018 at 0935  . bisacodyl (DULCOLAX) 10 MG suppository Place 1 suppository (10 mg total) rectally daily at 6 (six) AM. (Patient taking differently: Place 10 mg rectally daily as needed for mild constipation. ) 12 suppository 0 07/04/2018 at Unknown time  . carvedilol (COREG) 12.5 MG tablet Take 1 tablet (12.5 mg total) by mouth 2 (two) times daily with a meal.   07/05/2018 at 0935  . ferrous sulfate 325 (65 FE) MG tablet Take 1 tablet (325 mg total) by mouth 2 (two) times daily with a meal.  3 07/05/2018 at Unknown time  . liver oil-zinc  oxide (DESITIN) 40 % ointment Apply topically 4 (four) times daily. Apply to sacrum, coccyx, buttocks. (Patient taking differently: Apply 1 application topically 4 (four) times daily. Apply to sacrum, coccyx, buttocks.) 56.7 g 0 07/05/2018 at Unknown time  . polyethylene glycol (MIRALAX / GLYCOLAX) packet Take 17 g by mouth daily as needed for mild constipation or moderate constipation. 14 each 0 07/04/2018 at Unknown time  . senna-docusate (SENOKOT-S) 8.6-50 MG tablet Take 1 tablet by mouth 2 (two) times daily. (Patient taking differently: Take 1 tablet by mouth 2 (two) times daily as needed (constipation). )   07/04/2018 at Unknown time  . sodium phosphate (FLEET) 7-19 GM/118ML ENEM Place 133 mLs (1 enema total) rectally daily as needed for severe constipation.  0 unk  . tamsulosin (FLOMAX) 0.4 MG CAPS capsule Take 1 capsule (0.4 mg total) by mouth daily. 30 capsule  07/04/2018 at Unknown time  . vitamin C (VITAMIN C) 1000 MG tablet Take 1 tablet (1,000 mg total) by mouth 2 (two) times daily.   07/05/2018 at Unknown time  . amLODipine (NORVASC) 10 MG tablet Take 1 tablet (10 mg total) by mouth daily. (Patient not taking: Reported on 07/05/2018)   Not Taking at Unknown time  . collagenase (SANTYL) ointment Apply topically daily. Cleanse gluteal wounds with NS.  Apply Santyl to open areas.  Cover with  NS moist 2x2 and foam dressings.  Peel back foam and change daily. Foam is changed every three days and PRN soilage. (Patient not taking: Reported on 07/05/2018) 15 g 0 Not Taking at Unknown time  . levofloxacin (LEVAQUIN) 750 MG tablet Take 750 mg by mouth daily. For 7 days. Start date 06/25/2018. End date 07/01/2018   07/01/2018  . polyethylene glycol (MIRALAX / GLYCOLAX) packet Take 17 g by mouth 2 (two) times daily. 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 He is s/p drain placement and prolonged IV antibiotics.   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 He received a few doses upon admission but anticoagulation has been held since 1/19 for placement of CT guided drain.  Underwent tunneled catheter placement on 1/31 - okay to resume apixaban per MD. Last dose of enoxaparin 80 mg was on 2/1@0840 .   Serum creatinine stable, Hgb stable, plt WNL. No s/sx of bleeding.  Plan:  -Continue apixaban 5 mg twice daily  -Monitor renal function and for bleeding   Jeanella Cara, PharmD, [facility] Clinical Pharmacist Please see AMION for all Pharmacists' Contact Phone Numbers 07/23/2018, 7:43 AM

## 2018-07-23 NOTE — Progress Notes (Signed)
NAME:  Wayne Buckley, MRN:  [id number], DOB:  1951-04-14, LOS: 17 ADMISSION DATE:  07/05/2018, CONSULTATION DATE:  1/19 REFERRING MD:  Dr. Lynden Oxford [facility]), CHIEF COMPLAINT:  Pleural Effusion   Brief History   68 yo M, admitted 1/17, recent admission for MRSA bacteremia & UTI in setting of chronic foley sent to [facility] ED from rehab facility for acute on chronic anemia requiring transfusion.  ECHO during prior admit showed no evidence of vegetation (unable to perform TEE). He was discharged to SNF on 1/13.    Found to have left sided pneumonia with parapneumonic effusion. Blood cultures on admission grew MRSA bacteremia. Underwent US guided thoracentesis 1/18 that yielded 90 cc of fluid. Unable to aspiration additional fluid due to loculations. CT Chest with large loculated effusion and evidence of T11-T12 and T12-L1 diskitis / osteomyelitis.   Past Medical History  HTN, chronic anemia requiring recurrent transfusion (no clinical bleeding), history of TBI with neurogenic bladder, chronic indwelling foley, and DVT  Significant Hospital Events   1/17 Re admit from SNF (recent discharge for MRSA bacteremia) 1/24 Intra pleural tPA, pulmozyme, 100% NRB 1/25 CT 940 cc out 24 hours, another 200 cc out since 7 this morning 1/27 Off O2 1/30 tPA/DNase stopped after three consecutive doses 1/31 Significant improvement in chest xray 2/4 chest tube discontinued  Consults:  PCCM  ID IR  Procedures:  1/18 US guided thoracentesis >> LDH 146, Protein 3.9, WBC 1,755 1/21 Left pigtail chest tube drain placement per IR, removed 2/4  Significant Diagnostic Tests:  1/18 CTA Chest >> negative for PE. 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 Posterior subdural fluid collection  extending from T6 through T10 compatible with abscess. 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  Discitis and osteomyelitis at  T10-11, T11-T12, T12-L1, L1-2,L4-5 . Per Neurosurgery, does not need surgery now.  Loculated left effusion, concern for infection.   CT Chest w/o 1/28 >> left empyema with significant decrease in fluid after chest tube placement.  Still moderate patchy loculated pleural fluid on the left with extensive left sided atelectasis or consolidation  Micro Data:  1/17 Blood cx >> MRSA 1/17 Urine cx >> Negative 1/18 Thoracentesis fluid >> negative  1/18 Blood Culture >> negative  Pleural fluid 1/22 >> negative   Antimicrobials:  1/17 Cefepime x 1 1/17 Metronidazole x 1 1/17 Vancomycin >>   Interim history/subjective:  No acute events. Chest tube with 70-80cc drainage overnight.  Objective   Blood pressure 118/69, pulse 85, temperature 97.8 F (36.6 C), resp. rate 19, height 5\' 9"  (1.753 m), weight 79.1 kg, SpO2 99 %.        Intake/Output Summary (Last 24 hours) at 07/23/2018 1119 Last data filed at 07/23/2018 0600 Gross per 24 hour  Intake 200 ml  Output 1480 ml  Net -1280 ml   Filed Weights   07/05/18 1900 07/06/18 0139  Weight: 114.8 kg 79.1 kg    Examination: General: Chronically ill-appearing man, no distress HEENT: [place] / AT.  MMM. Neuro: Awake, alert, answers questions appropriately though delayed CV: Regular, no murmur PULM: Normal respiratory effort, clear on the right, decreased breath sounds on the left, chest tube in place on 20cm suction GI: Soft, benign, positive bowel sounds Extremities: No deformities, muscle wasting, trace bilateral lower  extremity edema Skin: No rash  Assessment & Plan:   Left Upper and Lower Lobe PNA with loculated pleural effusion -Thora 1/18 with 90 ml fluid / unable to completely drain due to loculation -IR CT placed 1/21 P: Chest tube output has continued to be around only 50 - 150cc daily;  therefore, will discontinue chest tube. Repeat CXR in AM. Recommend repeat CT scan of the chest in about 2 to 4 weeks to assess the left pleural space. He is not a candidate for VATS decortication due to his overall clinical illness.  MRSA Bacteremia T11-T12, T12-L1 Diskitis / Osteomyelitis Epidural Abscess  -unable to perform TEE due to cervical hardware P: Appreciate ID and neurosurgery input. Repeat spine imaging in about 6 weeks. Long-term antibiotics.  Suspect he will require for life.   Nothing further to add.  PCCM will sign off.  Please do not hesitate to call us back if we can be of any further assistance.   Best practice:  Diet: heart healthy  Pain/Anxiety/Delirium protocol (if indicated): N/A VAP protocol (if indicated): N/A DVT prophylaxis: Eliquis GI prophylaxis: N/A Glucose control: monitor Mobility: as tolerated Code Status: FULL Family Communication: No family present 2/3.  Plan discussed with Dr. Allena Katz 2/3.  None available 2/4.   Rutherford Guys, PA [facility] Pager: [phone].  If no answer, [phone] [id number] 07/23/2018, 11:24 AM

## 2018-07-23 NOTE — Progress Notes (Signed)
PCCM Interval Progress Note  Discussed with radiology PA.  Radiology a little hesitant to d/c chest tube given ongoing output and concerns he will require chest tube replacement.  Concerns that output might be slightly higher than what is documented.  Discussed with radiology and agree to remove chest tube from suction and ask RN to strictly document exact output over next 24 hours while on water seal.  If minimal output by tomorrow, then can consider d/c chest tube at that time.   Rutherford Guys, PA [facility] Pager: [phone].  If no answer, [phone] [id number] 07/23/2018, 11:52 AM

## 2018-07-24 ENCOUNTER — Inpatient Hospital Stay (HOSPITAL_COMMUNITY): Payer: Medicare Other

## 2018-07-24 NOTE — Progress Notes (Signed)
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  Was subsequently found to have recurrent MRSA bacteremia with left-sided pneumonia, loculated parapneumonic effusion, thoracic and lumbar spine discitis.  He underwent CT-guided chest tube placement and is followed by pulmonology and interventional radiology.  Given abnormal MRI findings, he has been followed by neurosurgery.  Neurosurgery recommended long-term IV antibiotics, no invasive intervention.  Pulmonology following chest tube and loculated pleural effusion.  Should be able to go to SNF when chest tube has been removed.  Continue long-term IV antibiotics.  A & P  left-sided pneumonia with complex and complicated, loculated left effusion acute hypoxic respiratory failure S/P TPA instillation  Respiratory status remained stable.  Follow-up chest CT showed improvement but still significant loculated fluid. Appreciate CCM management Significant improvement with TPA instillation.  No further TPA. Chest tube output is still more than 100. IR and CCM following. Plan is to repeat a CT scan of the chest tomorrow noncontrast and make a definite decision about chest tube. Repeat CT scan down the road into 3-4 weeks after discharge. No frank pus and therefore does not meet criteria for Empyema.  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   Not a candidate for TEE in the past secondary to possible esophageal obstruction.  Repeat blood cultures no  growth, final Continue prolonged IV antibiotics as recommended by infectious disease IV vancomycin, duration 10 weeks, and date 09/14/2018. Patient will follow-up 1 week after hospital discharge with Dr. Luciana Axe. Do not pull PICC line until cleared by ID. After completing his IV antibiotics for 10 weeks patient will also require protracted oral therapy for at least 6 to 12 months. Underwent tunneled PICC line placement. MRI of the whole spine with and without contrast under sedation negative for any C-spine infection. Presence of discitis and osteomyelitis at T10-11, T11-T12, T12-L1, L1-2, L4-5. Paraspinous abscess bilaterally at T11 and T12 extending into psoas muscle bilaterally as well as subdural fluid collection extending from T6-T10. Epidural abscess surrounding T11 and T12 cord. Neurosurgery recommended medical management and there are no plans for surgical intervention. Repeat imaging of the spine once antibiotics are completed.  He will require sedation for this again with anesthesia.  PMH traumatic brain injury, PMH C5-C7 spinal cord injury, chronic involuntary movements of the extremities, chronic urinary retention with chronic indwelling Foley catheter PT OT recommends SNF.  Essential hypertension Remains stable, continue carvedilol, Norvasc is currently on hold.  Iron deficiency anemia.   Continue iron supplementation.  Status post IV iron.  Status post transfusion 3 units PRBC. Hemoglobin remained stable. Work-up for hemolysis is unremarkable. Continue oral iron continue to monitor H&H.  PMH DVT Patient was on therapeutic Lovenox.  Will transition to Eliquis.  Unstageable pressure injury of the bilateral buttocks present on admission Continue wound care   Pressure Injury 07/10/18 Stage II -  Partial thickness loss of dermis presenting as a shallow open ulcer with a red, pink wound bed without slough. sacrum (Active)  07/10/18 1930  Location: Buttocks  Location  Orientation: Left;Right  Staging: Stage II -  Partial thickness loss of dermis presenting as a shallow open ulcer with a red, pink wound bed without slough.  Wound Description (Comments): sacrum  Present on Admission:     PMH TBI, cord compression.   Resident in SNF.   Chronic Foley for neurogenic bladder. Continue Flomax.  Constipation. Continue stool softeners.   Continue IV antibiotic.  Follow-up pulmonology recommendations.  Will remain hospitalized until able to remove chest tube.  DVT prophylaxis: Eliquis. Code Status: Full Family Communication: Discussed with wife at bedside Disposition Plan: SNF   Consultants  . ID . Neurosurgery  . IR . Pulmonology  Procedures  . Diagnostic left thoracentesis . CT-guided left chest tube placement 1/20 . MRI under anesthesia . Echo   Scheduled Meds: . apixaban  5 mg Oral BID  . carvedilol  6.25 mg Oral BID WC  . collagenase   Topical Daily  . ferrous sulfate  325 mg Oral BID WC  . polyethylene glycol  17 g Oral BID  . senna  1 tablet Oral QHS  . tamsulosin  0.4 mg Oral Daily   Continuous Infusions: . lactated ringers 10 mL/hr at 07/16/18 0445  . vancomycin 1,000 mg (07/24/18 0901)   PRN Meds: acetaminophen, HYDROcodone-acetaminophen, lidocaine (PF) Antibiotics: Anti-infectives (From admission, onward)   Start     Dose/Rate Route Frequency Ordered Stop   07/12/18 0600  vancomycin (VANCOCIN) IVPB 1000 mg/200 mL premix     1,000 mg 200 mL/hr over 60 Minutes Intravenous Every 24 hours 07/11/18 1708     07/08/18 1800  vancomycin (VANCOCIN) IVPB 750 mg/150 ml premix  Status:  Discontinued     750 mg 150 mL/hr over 60 Minutes Intravenous Every 12 hours 07/07/18 1219 07/11/18 1708   07/06/18 1200  vancomycin (VANCOCIN) IVPB 1000 mg/200 mL premix  Status:  Discontinued     1,000 mg 200 mL/hr over 60 Minutes Intravenous Every 12 hours 07/06/18 1014 07/07/18 1219   07/06/18 1000  vancomycin (VANCOCIN) IVPB 1000 mg/200 mL  premix  Status:  Discontinued     1,000 mg 200 mL/hr over 60 Minutes Intravenous Every 12 hours 07/06/18 0122 07/06/18 1006   07/06/18 1000  ceFEPIme (MAXIPIME) 2 g in sodium chloride 0.9 % 100 mL IVPB  Status:  Discontinued     2 g 200 mL/hr over 30 Minutes Intravenous Every 12 hours 07/06/18 0122 07/06/18 1756   07/05/18 2300  ceFEPIme (MAXIPIME) 2 g in sodium chloride 0.9 % 100 mL IVPB     2 g 200 mL/hr over 30 Minutes Intravenous  Once 07/05/18 2245 07/06/18 0014   07/05/18 2300  metroNIDAZOLE (FLAGYL) IVPB 500 mg  Status:  Discontinued     500 mg 100 mL/hr over 60 Minutes Intravenous Every 8 hours 07/05/18 2245 07/06/18 1006   07/05/18 2300  vancomycin (VANCOCIN) IVPB 1000 mg/200 mL premix  Status:  Discontinued     1,000 mg 200 mL/hr over 60 Minutes Intravenous  Once 07/05/18 2245 07/05/18 2249   07/05/18 2300  vancomycin (VANCOCIN) 2,000 mg in sodium chloride 0.9 % 500 mL IVPB     2,000 mg 250 mL/hr over 120 Minutes Intravenous  Once 07/05/18 2249 07/06/18 0259   07/05/18 2030  ceFEPIme (MAXIPIME) 2 g in sodium chloride 0.9 % 100 mL IVPB  Status:  Discontinued     2 g 200 mL/hr over 30 Minutes Intravenous  Once 07/05/18 2019 07/05/18 2022   07/05/18 2030  metroNIDAZOLE (FLAGYL) IVPB 500 mg  Status:  Discontinued  500 mg 100 mL/hr over 60 Minutes Intravenous Every 8 hours 07/05/18 2019 07/05/18 2022   07/05/18 2030  vancomycin (VANCOCIN) IVPB 1000 mg/200 mL premix  Status:  Discontinued     1,000 mg 200 mL/hr over 60 Minutes Intravenous  Once 07/05/18 2019 07/05/18 2022       Objective: Physical Exam: Vitals:   07/23/18 0808 07/23/18 1741 07/23/18 2300 07/24/18 0745  BP: 118/69 113/80 119/84 123/76  Pulse: 85 79 77 84  Resp:   18 18  Temp: 97.8 F (36.6 C) 98.3 F (36.8 C) 98.2 F (36.8 C) 98.4 F (36.9 C)  TempSrc:  Oral Oral Oral  SpO2: 99% 96% 97% 96%  Weight:      Height:        Intake/Output Summary (Last 24 hours) at 07/24/2018 1724 Last data filed at  07/24/2018 0600 Gross per 24 hour  Intake 300 ml  Output 1315 ml  Net -1015 ml   Filed Weights   07/05/18 1900 07/06/18 0139  Weight: 114.8 kg 79.1 kg   General: Alert, Awake and Oriented to Time, Place and Person. Appear in mild distress, affect appropriate Eyes: PERRL, Conjunctiva normal ENT: Oral Mucosa clear moist. Neck: difficult to assess  JVD, no Abnormal Mass Or lumps Cardiovascular: S1 and S2 Present, no Murmur, Peripheral Pulses Present Respiratory: normal respiratory effort, Bilateral Air entry equal and Decreased, no use of accessory muscle, Clear to Auscultation, no Crackles, no wheezes Abdomen: Bowel Sound present, Soft and no tenderness, no hernia Skin: no redness, no Rash, no induration Extremities: bilateral  Pedal edema, no calf tenderness Neurologic: Grossly no focal neuro deficit. Bilaterally Equal motor strength with chronic generalized weakness.  Data Reviewed: CBC: Recent Labs  Lab 07/18/18 1048 07/19/18 0245 07/20/18 0616 07/21/18 0427 07/22/18 0430  WBC 7.4 7.7 5.7 5.0 5.5  NEUTROABS 5.3  --   --   --   --   HGB 7.5* 7.7* 7.0* 7.3* 7.6*  HCT 24.0* 25.4* 24.5* 24.2* 24.6*  MCV 87.6 89.1 91.1 90.0 88.2  PLT 408* 394 462* 500* 563*   Basic Metabolic Panel: Recent Labs  Lab 07/18/18 0322 07/19/18 0245 07/20/18 0616 07/21/18 0427 07/22/18 0430  NA 138 139 139 136 137  K 3.8 3.6 3.3* 3.1* 3.9  CL 111 111 110 103 106  CO2 21* 22 23 24 24   GLUCOSE 101* 86 88 88 92  BUN 5* 6* 5* <5* 5*  CREATININE 0.69 0.70 0.67 0.64 0.65  CALCIUM 7.5* 7.5* 7.7* 7.4* 7.7*  MG 1.6*  --   --   --   --     Liver Function Tests: No results for input(s): AST, ALT, ALKPHOS, BILITOT, PROT, ALBUMIN in the last 168 hours. No results for input(s): LIPASE, AMYLASE in the last 168 hours. No results for input(s): AMMONIA in the last 168 hours. Coagulation Profile: Recent Labs  Lab 07/18/18 1048  INR 1.34   Cardiac Enzymes: No results for input(s): CKTOTAL, CKMB,  CKMBINDEX, TROPONINI in the last 168 hours. BNP (last 3 results) No results for input(s): PROBNP in the last 8760 hours. CBG: No results for input(s): GLUCAP in the last 168 hours. Studies: Dg Chest Port 1 View  Result Date: 07/24/2018 CLINICAL DATA:  Chest tube follow-up EXAM: PORTABLE CHEST 1 VIEW COMPARISON:  Two days ago FINDINGS: Left chest tube at the base. Improving left lung aeration, although still asymmetric volume loss and density. Bandlike opacities likely reflecting atelectasis. No evident pneumothorax. Central line on the right with  tip at the upper right atrium. The right lung is clear. Normal heart size. Granulomatous calcifications at the left hilum. IMPRESSION: 1. Improved aeration on the left since 2 days ago. 2. Stable chest tube positioning without pneumothorax or increasing pleural fluid. Electronically Signed   By: Marnee Spring M.D.   On: 07/24/2018 09:51     Time spent: 35 minutes  Author: Lynden Oxford, MD [facility] 07/24/2018 5:24 PM  Between 7PM-7AM, please contact night-coverage at [email or web address]

## 2018-07-24 NOTE — Progress Notes (Signed)
Physical Therapy Treatment Patient Details Name: Wayne Buckley MRN: [id number] DOB: 1950-06-21 Today's Date: 07/24/2018    History of Present Illness 68 y.o. male admitted 07/05/18 from SNF rehab with symptomatic anemia and MRSA bacteremia empyema.  Pt found to have L sided PNA with effusion requiring US guided thoracentesis (07/06/18) and pigtail chest tube placement in IR (07/09/18).  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     PT Comments    Patient with improved participation today, working with use of Stedy to transfer to chair with improved mechanics. continued cues needed for posture, demonstrating poor motor planning with dynamic tasks. Pt benefits from familiar therapists and will do well if continuity can be kept at next venue.     Follow Up Recommendations  SNF     Equipment Recommendations  Hospital bed;Other (comment)(hoyer lift)    Recommendations for Other Services Rehab consult     Precautions / Restrictions Precautions Precautions: Fall Precaution Comments: TBI and old SCI, very weak in his legs Restrictions Weight Bearing Restrictions: No    Mobility  Bed Mobility Overal bed mobility: Needs Assistance Bed Mobility: Rolling;Sidelying to Sit Rolling: Max assist;+2 for physical assistance Sidelying to sit: Max assist;+2 for physical assistance Supine to sit: Max assist;+2 for physical assistance;HOB elevated     General bed mobility comments: max A for rolling to right and then Max A +2 to bring BLEs over EOB and then elevate trunk  Transfers Overall transfer level: Needs assistance   Transfers: Sit to/from Stand;Stand Pivot Transfers Sit to Stand: Max assist;+2 physical assistance;From elevated surface Stand pivot transfers: Total assist;+2 physical assistance(while in [place])       General transfer comment: Max A +2 for power up into  standing. Cues for hand placement and then maintaining upright posture once seated on stedy  Ambulation/Gait             General Gait Details: unable   Stairs             Wheelchair Mobility    Modified Rankin (Stroke Patients Only)       Balance Overall balance assessment: Needs assistance Sitting-balance support: Feet supported;Bilateral upper extremity supported Sitting balance-[name] Scale: Poor Sitting balance - Comments: max A for sitting balance during grooming task. However, pt able to maintain sitting balance with [place] A at EOB when focused on task Postural control: Left lateral lean Standing balance support: Bilateral upper extremity supported;During functional activity Standing balance-[name] Scale: Poor Standing balance comment: Max A +2                            Cognition Arousal/Alertness: Awake/alert Behavior During Therapy: Restless;Flat affect Overall Cognitive Status: History of cognitive impairments - at baseline                                 General Comments: h/o TBI. Pt with increased following of cues this session. Benefits from calm environment and simple commands.       Exercises      General Comments General comments (skin integrity, edema, etc.): Wife, Angelique Blonder, present throughotu session      Pertinent Vitals/Pain Pain Assessment: Faces Faces Pain Scale: No hurt Pain Intervention(s): Limited activity within patient's tolerance;Monitored during session    Home Living  Prior Function            PT Goals (current goals can now be found in the care plan section) Acute Rehab PT Goals Patient Stated Goal: to get stronger PT Goal Formulation: With patient/family Time For Goal Achievement: 07/24/18 Potential to Achieve Goals: Fair Progress towards PT goals: Progressing toward goals    Frequency    Min 2X/week      PT Plan Current plan remains appropriate     Co-evaluation PT/OT/SLP Co-Evaluation/Treatment: Yes Reason for Co-Treatment: Complexity of the patient's impairments (multi-system involvement)   OT goals addressed during session: ADL's and self-care      AM-PAC PT "6 Clicks" Mobility   Outcome Measure  Help needed turning from your back to your side while in a flat bed without using bedrails?: A Lot Help needed moving from lying on your back to sitting on the side of a flat bed without using bedrails?: Total Help needed moving to and from a bed to a chair (including a wheelchair)?: Total Help needed standing up from a chair using your arms (e.g., wheelchair or bedside chair)?: Total Help needed to walk in hospital room?: Total Help needed climbing 3-5 steps with a railing? : Total 6 Click Score: 7    End of Session Equipment Utilized During Treatment: Gait belt Activity Tolerance: Patient tolerated treatment well Patient left: in chair;with call bell/phone within reach;with family/visitor present;with chair alarm set Nurse Communication: Mobility status;Need for lift equipment PT Visit Diagnosis: Muscle weakness (generalized) (M62.81);Unsteadiness on feet (R26.81);Other abnormalities of gait and mobility (R26.89);Other symptoms and signs involving the nervous system (R29.898)     Time: 1330-1400 PT Time Calculation (min) (ACUTE ONLY): 30 min  Charges:  $Therapeutic Activity: 8-22 mins                    Etta GrandchildSean Lovelyn Buckley, PT, DPT Acute Rehabilitation Services Pager: [phone] Office: [phone]     Etta GrandchildSean Xochilth Buckley 07/24/2018, 5:35 PM

## 2018-07-24 NOTE — Progress Notes (Signed)
Referring Physician(s): Dr. Lynden Oxford  Supervising Physician: Jolaine Click  Patient Status:  [facility] - In-pt  Chief Complaint: Follow up left chest tube  Subjective:  Patient denies complaints today - wife is at bedside, concerned about this morning's chest x-ray and what further plans are regarding this tube.   Allergies: Patient has no known allergies.  Medications: Prior to Admission medications   Medication Sig Start Date End Date Taking? Authorizing Provider  acetaminophen (TYLENOL) 325 MG tablet Take 2 tablets (650 mg total) by mouth every 6 (six) hours as needed for mild pain. 04/03/18  Yes Angiulli, Mcarthur Rossetti, PA-C  amLODipine (NORVASC) 5 MG tablet Take 5 mg by mouth daily.   Yes [provider]  apixaban (ELIQUIS) 5 MG TABS tablet Take 1 tablet (5 mg total) by mouth 2 (two) times daily. 04/03/18  Yes Angiulli, Mcarthur Rossetti, PA-C  bisacodyl (DULCOLAX) 10 MG suppository Place 1 suppository (10 mg total) rectally daily at 6 (six) AM. Patient taking differently: Place 10 mg rectally daily as needed for mild constipation.  04/03/18  Yes Angiulli, Mcarthur Rossetti, PA-C  carvedilol (COREG) 12.5 MG tablet Take 1 tablet (12.5 mg total) by mouth 2 (two) times daily with a meal. 04/03/18  Yes Angiulli, Mcarthur Rossetti, PA-C  ferrous sulfate 325 (65 FE) MG tablet Take 1 tablet (325 mg total) by mouth 2 (two) times daily with a meal. 05/31/18  Yes Calvert Cantor, MD  liver oil-zinc oxide (DESITIN) 40 % ointment Apply topically 4 (four) times daily. Apply to sacrum, coccyx, buttocks. Patient taking differently: Apply 1 application topically 4 (four) times daily. Apply to sacrum, coccyx, buttocks. 05/14/18  Yes Rai, Ripudeep K, MD  polyethylene glycol (MIRALAX / GLYCOLAX) packet Take 17 g by mouth daily as needed for mild constipation or moderate constipation. 05/31/18  Yes Rizwan, Ladell Heads, MD  senna-docusate (SENOKOT-S) 8.6-50 MG tablet Take 1 tablet by mouth 2 (two) times daily. Patient taking  differently: Take 1 tablet by mouth 2 (two) times daily as needed (constipation).  05/14/18  Yes Rai, Ripudeep K, MD  sodium phosphate (FLEET) 7-19 GM/118ML ENEM Place 133 mLs (1 enema total) rectally daily as needed for severe constipation. 05/31/18  Yes Calvert Cantor, MD  tamsulosin (FLOMAX) 0.4 MG CAPS capsule Take 1 capsule (0.4 mg total) by mouth daily. 03/06/18  Yes Leroy Sea, MD  vitamin C (VITAMIN C) 1000 MG tablet Take 1 tablet (1,000 mg total) by mouth 2 (two) times daily. 04/03/18  Yes Angiulli, Mcarthur Rossetti, PA-C  amLODipine (NORVASC) 10 MG tablet Take 1 tablet (10 mg total) by mouth daily. Patient not taking: Reported on 07/05/2018 03/06/18   Leroy Sea, MD  collagenase (SANTYL) ointment Apply topically daily. Cleanse gluteal wounds with NS.  Apply Santyl to open areas.  Cover with NS moist 2x2 and foam dressings.  Peel back foam and change daily. Foam is changed every three days and PRN soilage. Patient not taking: Reported on 07/05/2018 05/15/18   Rai, Delene Ruffini, MD  levofloxacin (LEVAQUIN) 750 MG tablet Take 750 mg by mouth daily. For 7 days. Start date 06/25/2018. End date 07/01/2018    [provider]  polyethylene glycol (MIRALAX / GLYCOLAX) packet Take 17 g by mouth 2 (two) times daily. Patient not taking: Reported on 07/05/2018 05/14/18   Cathren Harsh, MD     Vital Signs: BP 123/76   Pulse 84   Temp 98.4 F (36.9 C) (Oral)   Resp 18   Ht 5'  9" (1.753 m)   Wt 174 lb 6.1 oz (79.1 kg)   SpO2 96%   BMI 25.75 kg/m   Physical Exam Vitals signs and nursing note reviewed.  Constitutional:      General: He is not in acute distress. HENT:     Head: Normocephalic.  Cardiovascular:     Rate and Rhythm: Normal rate.  Pulmonary:     Effort: Pulmonary effort is normal.     Comments: (+) left chest tube to waterseal; 1150 serosanguineous OP in pleruvac. Insertion site clean, dry, dressed appropriately, no pain on palpation.  Skin:    General: Skin is warm  and dry.  Neurological:     Mental Status: He is alert. Mental status is at baseline.     Imaging: Dg Chest Port 1 View  Result Date: 07/24/2018 CLINICAL DATA:  Chest tube follow-up EXAM: PORTABLE CHEST 1 VIEW COMPARISON:  Two days ago FINDINGS: Left chest tube at the base. Improving left lung aeration, although still asymmetric volume loss and density. Bandlike opacities likely reflecting atelectasis. No evident pneumothorax. Central line on the right with tip at the upper right atrium. The right lung is clear. Normal heart size. Granulomatous calcifications at the left hilum. IMPRESSION: 1. Improved aeration on the left since 2 days ago. 2. Stable chest tube positioning without pneumothorax or increasing pleural fluid. Electronically Signed   By: Marnee Spring M.D.   On: 07/24/2018 09:51   Dg Chest Port 1 View  Result Date: 07/22/2018 CLINICAL DATA:  Empyema post left chest tube placement 07/08/2018. EXAM: PORTABLE CHEST 1 VIEW COMPARISON:  Radiographs 07/19/2018.  CT 07/16/2018. FINDINGS: Pigtail catheter overlying the lower left chest is unchanged in position. Possible mild re-accumulation of left pleural effusion with stable underlying left pulmonary opacity and central calcifications. There is no pneumothorax. The right lung is clear. A new right IJ PICC line has been placed, the tip of which is not well visualized although appears to extend to the mid right atrial level. Previous lower cervical fusion noted. IMPRESSION: 1. Interval possible mild re-accumulation of left pleural effusion following pigtail catheter placement in the left pleural space. No pneumothorax. 2. Right-sided PICC line projects to approximately the mid right atrial level. Electronically Signed   By: Carey Bullocks M.D.   On: 07/22/2018 14:44    Labs:  CBC: Recent Labs    07/19/18 0245 07/20/18 0616 07/21/18 0427 07/22/18 0430  WBC 7.7 5.7 5.0 5.5  HGB 7.7* 7.0* 7.3* 7.6*  HCT 25.4* 24.5* 24.2* 24.6*  PLT 394  462* 500* 563*    COAGS: Recent Labs    02/25/18 1413  05/10/18 0642 05/11/18 0615 05/12/18 0628 05/13/18 0503 07/05/18 1918 07/11/18 1113 07/18/18 1048  INR 1.16  --   --   --   --   --  1.89 1.34 1.34  APTT  --    < > 54* 53* 51* 54*  --   --   --    < > = values in this interval not displayed.    BMP: Recent Labs    07/19/18 0245 07/20/18 0616 07/21/18 0427 07/22/18 0430  NA 139 139 136 137  K 3.6 3.3* 3.1* 3.9  CL 111 110 103 106  CO2 22 23 24 24   GLUCOSE 86 88 88 92  BUN 6* 5* <5* 5*  CALCIUM 7.5* 7.7* 7.4* 7.7*  CREATININE 0.70 0.67 0.64 0.65  GFRNONAA >60 >60 >60 >60  GFRAA >60 >60 >60 >60    LIVER  FUNCTION TESTS: Recent Labs    05/17/18 0659 07/05/18 1918 07/07/18 0835 07/09/18 1725  BILITOT 0.5 0.7 0.4 0.5  AST 20 26 19 17   ALT 16 11 9 8   ALKPHOS 78 96 97 96  PROT 6.2* 6.3* 5.5* 5.8*  ALBUMIN 2.0* 1.8* 1.4* 1.5*    Assessment and Plan:  Patient with MRSA bacetermia, left empyema s/p chest tube placement 1/20 in IR. Continued significant serosanguineous OP - 215 cc in last 24H per chart. Tube currently to water seal, no air leak noted. CXR this AM shows improved aeration on the left, stable chest tube positioning without pneumothorax or increasing pleural fluid.   Given OP >100 mL will not remove chest tube today. Continue current management - record OP Q24H. Will continue to follow along with CCM.   Electronically Signed: Villa HerbShannon A Watterson, PA-C 07/24/2018, 1:24 PM   I spent a total of 15 Minutes at the the patient's bedside AND on the patient's hospital floor or unit, greater than 50% of which was counseling/coordinating care for left chest tube follow up.

## 2018-07-24 NOTE — Progress Notes (Signed)
Occupational Therapy Treatment Patient Details Name: Wayne Buckley MRN: [id number] DOB: Feb 12, 1951 Today's Date: 07/24/2018    History of present illness 68 y.o. male admitted 07/05/18 from SNF rehab with symptomatic anemia and MRSA bacteremia empyema.  Pt found to have L sided PNA with effusion requiring US guided thoracentesis (07/06/18) and pigtail chest tube placement in IR (07/09/18).  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    OT comments  Pt progressing towards established OT goals. Pt demonstrating increased following of commands this session and benefits from calm environment and simple cues. Pt performing bed mobility with Max A +2 and then sitting at EOB to perform grooming task. Pt brushing his hair with Max A for sitting balance and Min A for performance of task. Pt performing sit<>stand with sara stedy and Max A +2 for time OOB. Will continue to follow acutely as admitted and continue to recommend dc to SNF.    Follow Up Recommendations  SNF;Supervision/Assistance - 24 hour    Equipment Recommendations  Other (comment)    Recommendations for Other Services PT consult    Precautions / Restrictions Precautions Precautions: Fall Precaution Comments: TBI and old SCI, very weak in his legs Restrictions Weight Bearing Restrictions: No       Mobility Bed Mobility Overal bed mobility: Needs Assistance Bed Mobility: Rolling;Sidelying to Sit Rolling: Max assist;+2 for physical assistance Sidelying to sit: Max assist;+2 for physical assistance       General bed mobility comments: max A for rolling to right and then Max A +2 to bring BLEs over EOB and then elevate trunk  Transfers Overall transfer level: Needs assistance   Transfers: Sit to/from Stand;Stand Pivot Transfers Sit to Stand: Max assist;+2 physical assistance;From elevated surface Stand pivot  transfers: Total assist;+2 physical assistance(pivot while on stedy)       General transfer comment: Max A +2 for power up into standing. Cues for hand placement and then maintaining upright posture once seated on stedy    Balance Overall balance assessment: Needs assistance Sitting-balance support: Feet supported;Bilateral upper extremity supported Sitting balance-[name] Scale: Poor(Moments of fair were pt was able to maintain sitting balance) Sitting balance - Comments: max A for sitting balance during grooming task. However, pt able to maintain sitting balance with Min Guard A at EOB when focused on task   Standing balance support: Bilateral upper extremity supported;During functional activity Standing balance-[name] Scale: Poor Standing balance comment: Max A +2                           ADL either performed or assessed with clinical judgement   ADL Overall ADL's : Needs assistance/impaired     Grooming: Brushing hair;Minimal assistance;Sitting;Maximal assistance Grooming Details (indicate cue type and reason): Pt with Max A while sitting at EOB. Pt requiring Max cues to attend to tasks and participate in brushing his own hair. Min A for brushing the back of his hair.              Lower Body Dressing: Total assistance;Sitting/lateral leans Lower Body Dressing Details (indicate cue type and reason): donned socks at bed level Toilet Transfer: +2 for physical assistance;Minimal assistance;Maximal assistance(sit<>stand with stedy. ) Toilet Transfer Details (indicate cue type and reason): Max A +2 for sit<>stand with sara stedy. Pt then able to sit upright with Max cues on stedy. Requiring Min A for safety  during pivot with stedy         Functional mobility during ADLs: Maximal assistance;+2 for physical assistance(stedy) General ADL Comments: Pt continues to require Max encouragement for participation. Once OOB, pt is very happy to be in the chair. Pt with bowel  incontience and required Max A +2 for bedlevel cleaning     Vision       Perception     Praxis      Cognition Arousal/Alertness: Awake/alert Behavior During Therapy: Restless;Flat affect Overall Cognitive Status: History of cognitive impairments - at baseline                                 General Comments: h/o TBI. Pt with increased following of cues this session. Benefits from calm environment and simple commands.         Exercises     Shoulder Instructions       General Comments Wife, [name], present throughotu session    Pertinent Vitals/ Pain       Pain Assessment: Faces Faces Pain Scale: No hurt Pain Intervention(s): Monitored during session  Home Living                                          Prior Functioning/Environment              Frequency  Min 2X/week        Progress Toward Goals  OT Goals(current goals can now be found in the care plan section)  Progress towards OT goals: Progressing toward goals  Acute Rehab OT Goals Patient Stated Goal: to get stronger(1110) OT Goal Formulation: With patient/family Time For Goal Achievement: 07/24/18 Potential to Achieve Goals: Good ADL Goals Pt Will Perform Eating: with set-up Pt Will Perform Grooming: with set-up Pt Will Transfer to Toilet: with mod assist Additional ADL Goal #1: Pt will follow two step commands 3x during ADL completion.  Plan Discharge plan remains appropriate    Co-evaluation    PT/OT/SLP Co-Evaluation/Treatment: Yes Reason for Co-Treatment: Complexity of the patient's impairments (multi-system involvement);Necessary to address cognition/behavior during functional activity;For patient/therapist safety;To address functional/ADL transfers   OT goals addressed during session: ADL's and self-care      AM-PAC OT "6 Clicks" Daily Activity     Outcome Measure   Help from another person eating meals?: A Lot Help from another person taking  care of personal grooming?: A Lot Help from another person toileting, which includes using toliet, bedpan, or urinal?: Total Help from another person bathing (including washing, rinsing, drying)?: A Lot Help from another person to put on and taking off regular upper body clothing?: A Lot Help from another person to put on and taking off regular lower body clothing?: Total 6 Click Score: 10    End of Session Equipment Utilized During Treatment: Gait belt;Other (comment)(sara stedy)  OT Visit Diagnosis: Unsteadiness on feet (R26.81);Other abnormalities of gait and mobility (R26.89);Muscle weakness (generalized) (M62.81);History of falling (Z91.81);Other symptoms and signs involving cognitive function;Pain Pain - part of body: (Generalized)   Activity Tolerance Patient tolerated treatment well;Patient limited by fatigue   Patient Left in chair;with call bell/phone within reach;with chair alarm set;with family/visitor present   Nurse Communication Need for lift equipment;Mobility status        Time: [id number] OT Time Calculation (min): 46 min  Charges: OT  General Charges $OT Visit: 1 Visit OT Treatments $Self Care/Home Management : 23-37 mins  Alexandro Line MSOT, OTR/L Acute Rehab Pager: [phone] Office: [phone]   Theodoro Grist Jaxzen Vanhorn 07/24/2018, 4:00 PM

## 2018-07-24 NOTE — Progress Notes (Signed)
NAME:  Wayne Buckley, MRN:  [id number], DOB:  Feb 10, 1951, LOS: 18 ADMISSION DATE:  07/05/2018, CONSULTATION DATE:  1/19 REFERRING MD:  Dr. Lynden Oxford [facility]), CHIEF COMPLAINT:  Pleural Effusion   Brief History   68 yo M, admitted 1/17, recent admission for MRSA bacteremia & UTI in setting of chronic foley sent to [facility] ED from rehab facility for acute on chronic anemia requiring transfusion.  ECHO during prior admit showed no evidence of vegetation (unable to perform TEE). He was discharged to SNF on 1/13.    Found to have left sided pneumonia with parapneumonic effusion. Blood cultures on admission grew MRSA bacteremia. Underwent US guided thoracentesis 1/18 that yielded 90 cc of fluid. Unable to aspiration additional fluid due to loculations. CT Chest with large loculated effusion and evidence of T11-T12 and T12-L1 diskitis / osteomyelitis.   Past Medical History  HTN, chronic anemia requiring recurrent transfusion (no clinical bleeding), history of TBI with neurogenic bladder, chronic indwelling foley, and DVT  Significant Hospital Events   1/17 Re admit from SNF (recent discharge for MRSA bacteremia) 1/24 Intra pleural tPA, pulmozyme, 100% NRB 1/25 CT 940 cc out 24 hours, another 200 cc out since 7 this morning 1/27 Off O2 1/30 tPA/DNase stopped after three consecutive doses 1/31 Significant improvement in chest xray 2/4 chest tube discontinued  Consults:  PCCM  ID IR  Procedures:  1/18 US guided thoracentesis >> LDH 146, Protein 3.9, WBC 1,755 1/21 Left pigtail chest tube drain placement per IR, removed 2/4  Significant Diagnostic Tests:  1/18 CTA Chest >> negative for PE. 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 Posterior subdural fluid collection  extending from T6 through T10 compatible with abscess. 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  Discitis and osteomyelitis at  T10-11, T11-T12, T12-L1, L1-2,L4-5 . Per Neurosurgery, does not need surgery now.  Loculated left effusion, concern for infection.   CT Chest w/o 1/28 >> left empyema with significant decrease in fluid after chest tube placement.  Still moderate patchy loculated pleural fluid on the left with extensive left sided atelectasis or consolidation  Micro Data:  1/17 Blood cx >> MRSA 1/17 Urine cx >> Negative 1/18 Thoracentesis fluid >> negative  1/18 Blood Culture >> negative  Pleural fluid 1/22 >> negative   Antimicrobials:  1/17 Cefepime x 1 1/17 Metronidazole x 1 1/17 Vancomycin >>   Interim history/subjective:  No acute events. Chest tube with 215cc drainage in the last 24 hours.  Objective   Blood pressure 123/76, pulse 84, temperature 98.4 F (36.9 C), temperature source Oral, resp. rate 18, height 5\' 9"  (1.753 m), weight 79.1 kg, SpO2 96 %.        Intake/Output Summary (Last 24 hours) at 07/24/2018 1354 Last data filed at 07/24/2018 0600 Gross per 24 hour  Intake 300 ml  Output 1315 ml  Net -1015 ml   Filed Weights   07/05/18 1900 07/06/18 0139  Weight: 114.8 kg 79.1 kg    Examination: General: chronically ill appearing male in no acute distress HEENT: [place]/AT, PERRL, no JVD.  Neuro: Awake, alert, oriented, non-focal CV: RRR, no MRG PULM: Normal respiratory effort, clear on the right, decreased breath sounds on the left, chest tube in place on 20cm suction GI: Soft, benign, positive bowel sounds Extremities:  No acute deformity Skin: No rash, grossly intact.   Assessment & Plan:   Left Upper and Lower Lobe PNA with loculated pleural effusion -Thora 1/18 with 90 ml fluid / unable to completely drain due to loculation -IR CT placed 1/21 P: Chest tube output on water seal 215 cc. Continue CT to water seal.  When consistently less  than 100 cc, can consider discontinuing.  Repeat CXR in AM. Recommend repeat CT scan of the chest in about 2 to 4 weeks to assess the left pleural space. He is not a candidate for VATS decortication due to his overall clinical illness.  MRSA Bacteremia T11-T12, T12-L1 Diskitis / Osteomyelitis Epidural Abscess  -unable to perform TEE due to cervical hardware P: ID and neurosurgery following Long-term antibiotics.  Suspect he will require for life.  Best practice:  Diet: heart healthy  Pain/Anxiety/Delirium protocol (if indicated): N/A VAP protocol (if indicated): N/A DVT prophylaxis: Eliquis GI prophylaxis: N/A Glucose control: monitor Mobility: as tolerated Code Status: FULL Family Communication:    Joneen Roach, AGACNP-BC [facility] Pulmonary/Critical Care Pager [phone] or [phone]  07/24/2018 1:57 PM

## 2018-07-25 ENCOUNTER — Inpatient Hospital Stay
Admission: RE | Admit: 2018-07-25 | Discharge: 2018-09-29 | Disposition: A | Discharge status: Still In Hospital | Payer: Medicare Other | Source: Other Acute Inpatient Hospital | Attending: Internal Medicine | Admitting: Internal Medicine

## 2018-07-25 ENCOUNTER — Inpatient Hospital Stay (HOSPITAL_COMMUNITY): Payer: Medicare Other

## 2018-07-25 DIAGNOSIS — T17500A Unspecified foreign body in bronchus causing asphyxiation, initial encounter: Secondary | ICD-10-CM

## 2018-07-25 DIAGNOSIS — Z4682 Encounter for fitting and adjustment of non-vascular catheter: Secondary | ICD-10-CM

## 2018-07-25 DIAGNOSIS — S069X9A Unspecified intracranial injury with loss of consciousness of unspecified duration, initial encounter: Secondary | ICD-10-CM | POA: Diagnosis present

## 2018-07-25 DIAGNOSIS — S14109A Unspecified injury at unspecified level of cervical spinal cord, initial encounter: Secondary | ICD-10-CM | POA: Diagnosis present

## 2018-07-25 DIAGNOSIS — J9690 Respiratory failure, unspecified, unspecified whether with hypoxia or hypercapnia: Secondary | ICD-10-CM

## 2018-07-25 DIAGNOSIS — Z9689 Presence of other specified functional implants: Secondary | ICD-10-CM

## 2018-07-25 DIAGNOSIS — R0603 Acute respiratory distress: Secondary | ICD-10-CM

## 2018-07-25 DIAGNOSIS — J189 Pneumonia, unspecified organism: Secondary | ICD-10-CM

## 2018-07-25 DIAGNOSIS — Z4659 Encounter for fitting and adjustment of other gastrointestinal appliance and device: Secondary | ICD-10-CM

## 2018-07-25 DIAGNOSIS — Z9889 Other specified postprocedural states: Secondary | ICD-10-CM

## 2018-07-25 DIAGNOSIS — Z789 Other specified health status: Secondary | ICD-10-CM

## 2018-07-25 DIAGNOSIS — J90 Pleural effusion, not elsewhere classified: Secondary | ICD-10-CM

## 2018-07-25 DIAGNOSIS — J9621 Acute and chronic respiratory failure with hypoxia: Secondary | ICD-10-CM | POA: Diagnosis present

## 2018-07-25 DIAGNOSIS — J9811 Atelectasis: Secondary | ICD-10-CM

## 2018-07-25 DIAGNOSIS — R7881 Bacteremia: Secondary | ICD-10-CM | POA: Diagnosis present

## 2018-07-25 DIAGNOSIS — I469 Cardiac arrest, cause unspecified: Secondary | ICD-10-CM

## 2018-07-25 DIAGNOSIS — Z931 Gastrostomy status: Secondary | ICD-10-CM

## 2018-07-25 DIAGNOSIS — J9809 Other diseases of bronchus, not elsewhere classified: Secondary | ICD-10-CM

## 2018-07-25 DIAGNOSIS — S069XAA Unspecified intracranial injury with loss of consciousness status unknown, initial encounter: Secondary | ICD-10-CM | POA: Diagnosis present

## 2018-07-25 DIAGNOSIS — J181 Lobar pneumonia, unspecified organism: Secondary | ICD-10-CM | POA: Diagnosis present

## 2018-07-25 DIAGNOSIS — Z431 Encounter for attention to gastrostomy: Secondary | ICD-10-CM

## 2018-07-25 HISTORY — DX: Acute and chronic respiratory failure with hypoxia: J96.21

## 2018-07-25 HISTORY — DX: Lobar pneumonia, unspecified organism: J18.1

## 2018-07-25 HISTORY — DX: Bacteremia: R78.81

## 2018-07-25 LAB — BASIC METABOLIC PANEL
Anion gap: 9 (ref 5–15)
BUN: 5 mg/dL — ABNORMAL LOW (ref 8–23)
CO2: 25 mmol/L (ref 22–32)
Calcium: 7.8 mg/dL — ABNORMAL LOW (ref 8.9–10.3)
Chloride: 103 mmol/L (ref 98–111)
Creatinine, Ser: 0.69 mg/dL (ref 0.61–1.24)
GFR calc Af Amer: >60 mL/min (ref >60)
Glucose, Bld: 106 mg/dL — ABNORMAL HIGH (ref 70–99)
Potassium: 3.7 mmol/L (ref 3.5–5.1)
Sodium: 137 mmol/L (ref 135–145)

## 2018-07-25 LAB — CBC
HCT: 24.6 % — ABNORMAL LOW (ref 39.0–52.0)
Hemoglobin: 7.2 g/dL — ABNORMAL LOW (ref 13.0–17.0)
MCH: 26.2 pg (ref 26.0–34.0)
MCHC: 29.3 g/dL — ABNORMAL LOW (ref 30.0–36.0)
MCV: 89.5 fL (ref 80.0–100.0)
NRBC: 0 % (ref 0.0–0.2)
Platelets: 651 10*3/uL — ABNORMAL HIGH (ref 150–400)
RBC: 2.75 MIL/uL — ABNORMAL LOW (ref 4.22–5.81)
RDW: 16.3 % — ABNORMAL HIGH (ref 11.5–15.5)
WBC: 6 10*3/uL (ref 4.0–10.5)

## 2018-07-25 LAB — VANCOMYCIN, TROUGH: Vancomycin Tr: 18 ug/mL (ref 15–20)

## 2018-07-25 LAB — VANCOMYCIN, PEAK: Vancomycin Pk: 27 ug/mL — ABNORMAL LOW (ref 30–40)

## 2018-07-25 MED ORDER — CARVEDILOL 6.25 MG PO TABS: 6.2500 mg | ORAL_TABLET | Freq: Two times a day (BID) | ORAL | Status: AC

## 2018-07-25 MED ORDER — VANCOMYCIN HCL IN DEXTROSE 1-5 GM/200ML-% IV SOLN: 1000.0000 mg | INTRAVENOUS | Status: AC

## 2018-07-25 MED ORDER — LEVALBUTEROL HCL 0.63 MG/3ML IN NEBU: 0.6300 mg | INHALATION_SOLUTION | Freq: Three times a day (TID) | RESPIRATORY_TRACT | 12 refills | Status: AC

## 2018-07-25 MED ORDER — LEVALBUTEROL HCL 0.63 MG/3ML IN NEBU
0.6300 mg | INHALATION_SOLUTION | Freq: Three times a day (TID) | RESPIRATORY_TRACT | Status: DC
  Administered 2018-07-25: 0.63 mg via RESPIRATORY_TRACT
  Filled 2018-07-25: qty 3

## 2018-07-25 MED ORDER — GUAIFENESIN ER 600 MG PO TB12
600.0000 mg | ORAL_TABLET | Freq: Two times a day (BID) | ORAL | Status: DC
  Administered 2018-07-25: 600 mg via ORAL
  Filled 2018-07-25: qty 1

## 2018-07-25 MED ORDER — LEVALBUTEROL TARTRATE 45 MCG/ACT IN AERO: 2.0000 | INHALATION_SPRAY | Freq: Three times a day (TID) | RESPIRATORY_TRACT | Status: DC

## 2018-07-25 MED ORDER — GUAIFENESIN ER 600 MG PO TB12: 600.0000 mg | ORAL_TABLET | Freq: Two times a day (BID) | ORAL | 0 refills | Status: AC

## 2018-07-25 MED ORDER — HYDROCODONE-ACETAMINOPHEN 5-325 MG PO TABS: 1.0000 | ORAL_TABLET | Freq: Four times a day (QID) | ORAL | 0 refills | Status: AC | PRN

## 2018-07-25 MED ORDER — SENNA 8.6 MG PO TABS: 1.0000 | ORAL_TABLET | Freq: Every day | ORAL | 0 refills | Status: AC

## 2018-07-25 NOTE — Progress Notes (Signed)
Referring Physician(s): Patel,P  Supervising Physician: Gilmer Mor  Patient Status:  [facility] - In-pt  Chief Complaint:  Left chest empyema  Subjective: Pt without new c/o ; denies worsening CP/dyspnea   Allergies: Patient has no known allergies.  Medications: Prior to Admission medications   Medication Sig Start Date End Date Taking? Authorizing Provider  acetaminophen (TYLENOL) 325 MG tablet Take 2 tablets (650 mg total) by mouth every 6 (six) hours as needed for mild pain. 04/03/18  Yes Angiulli, Mcarthur Rossetti, PA-C  amLODipine (NORVASC) 5 MG tablet Take 5 mg by mouth daily.   Yes [provider]  apixaban (ELIQUIS) 5 MG TABS tablet Take 1 tablet (5 mg total) by mouth 2 (two) times daily. 04/03/18  Yes Angiulli, Mcarthur Rossetti, PA-C  bisacodyl (DULCOLAX) 10 MG suppository Place 1 suppository (10 mg total) rectally daily at 6 (six) AM. Patient taking differently: Place 10 mg rectally daily as needed for mild constipation.  04/03/18  Yes Angiulli, Mcarthur Rossetti, PA-C  carvedilol (COREG) 12.5 MG tablet Take 1 tablet (12.5 mg total) by mouth 2 (two) times daily with a meal. 04/03/18  Yes Angiulli, Mcarthur Rossetti, PA-C  ferrous sulfate 325 (65 FE) MG tablet Take 1 tablet (325 mg total) by mouth 2 (two) times daily with a meal. 05/31/18  Yes Calvert Cantor, MD  liver oil-zinc oxide (DESITIN) 40 % ointment Apply topically 4 (four) times daily. Apply to sacrum, coccyx, buttocks. Patient taking differently: Apply 1 application topically 4 (four) times daily. Apply to sacrum, coccyx, buttocks. 05/14/18  Yes Rai, Ripudeep K, MD  polyethylene glycol (MIRALAX / GLYCOLAX) packet Take 17 g by mouth daily as needed for mild constipation or moderate constipation. 05/31/18  Yes Rizwan, Ladell Heads, MD  senna-docusate (SENOKOT-S) 8.6-50 MG tablet Take 1 tablet by mouth 2 (two) times daily. Patient taking differently: Take 1 tablet by mouth 2 (two) times daily as needed (constipation).  05/14/18  Yes Rai, Ripudeep K,  MD  sodium phosphate (FLEET) 7-19 GM/118ML ENEM Place 133 mLs (1 enema total) rectally daily as needed for severe constipation. 05/31/18  Yes Calvert Cantor, MD  tamsulosin (FLOMAX) 0.4 MG CAPS capsule Take 1 capsule (0.4 mg total) by mouth daily. 03/06/18  Yes Leroy Sea, MD  vitamin C (VITAMIN C) 1000 MG tablet Take 1 tablet (1,000 mg total) by mouth 2 (two) times daily. 04/03/18  Yes Angiulli, Mcarthur Rossetti, PA-C  amLODipine (NORVASC) 10 MG tablet Take 1 tablet (10 mg total) by mouth daily. Patient not taking: Reported on 07/05/2018 03/06/18   Leroy Sea, MD  collagenase (SANTYL) ointment Apply topically daily. Cleanse gluteal wounds with NS.  Apply Santyl to open areas.  Cover with NS moist 2x2 and foam dressings.  Peel back foam and change daily. Foam is changed every three days and PRN soilage. Patient not taking: Reported on 07/05/2018 05/15/18   Rai, Delene Ruffini, MD  levofloxacin (LEVAQUIN) 750 MG tablet Take 750 mg by mouth daily. For 7 days. Start date 06/25/2018. End date 07/01/2018    [provider]  polyethylene glycol (MIRALAX / GLYCOLAX) packet Take 17 g by mouth 2 (two) times daily. Patient not taking: Reported on 07/05/2018 05/14/18   Cathren Harsh, MD     Vital Signs: BP 128/71   Pulse 81   Temp 97.6 F (36.4 C) (Oral)   Resp 20   Ht 5\' 9"  (1.753 m)   Wt 174 lb 6.1 oz (79.1 kg)   SpO2 96%   BMI  25.75 kg/m   Physical Exam left chest drain intact, insertion site NT,  no obvious air leak; output 120 cc  Imaging: Ct Chest Wo Contrast  Result Date: 07/25/2018 CLINICAL DATA:  Status post percutaneous drainage of a left empyema. Follow-up exam. EXAM: CT CHEST WITHOUT CONTRAST TECHNIQUE: Multidetector CT imaging of the chest was performed following the standard protocol without IV contrast. COMPARISON:  Chest radiographs, most recent dated 07/24/2018. Chest CT, 07/16/2018. FINDINGS: Cardiovascular: Heart is normal in size. No pericardial effusion. 2 vessel coronary  artery calcifications. Great vessels are normal in caliber. Minor aortic atherosclerotic calcifications. Mediastinum/Nodes: No neck base or axillary masses or enlarged lymph nodes. There are calcified subcarinal and left hilar lymph nodes that are stable from the prior CT. No mediastinal or hilar masses. No pathologically enlarged lymph nodes. Some secretions are noted in the trachea. Trachea otherwise unremarkable. Esophagus is unremarkable. Lungs/Pleura: Small left and minimal right pleural effusions. Pigtail catheter curls in the posteromedial, inferior left hemithorax. A small amount of fluid tracks along the left oblique fissure. There is dependent opacity in both lower lobes, greater on the left, consistent with atelectasis, infection or a combination. Linear atelectasis is also noted in the left upper lobe associated with calcifications. Lungs otherwise clear. When compared to the prior CT, left lung consolidation has significantly improved. No evidence of pulmonary edema.  No pneumothorax. Upper Abdomen: No acute abnormality. Musculoskeletal: There is irregular endplate resorption, lower endplate of T11, upper endplate of T12, lower endplate of T12 and upper endplate of L1, with abnormal surrounding soft tissue attenuation, which appears to have progressed when compared to a thoracic MRI dated 07/08/2018. IMPRESSION: 1. Significant improvement in the appearance of the left lung, with a decrease in pleural fluid/empyema, following left chest tube placement, when compared to the prior chest CT dated 07/16/2018. 2. No new lung abnormalities. Small left and minimal right pleural effusions. Residual opacity noted in the left lower lobe that may reflect infection or atelectasis or a combination. Mild right posterior lower lobe atelectasis. 3. 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 Aortic Atherosclerosis (ICD10-I70.0).  Electronically Signed   By: Amie Portlandavid  Ormond M.D.   On: 07/25/2018 10:46   Dg Chest Port 1 View  Result Date: 07/24/2018 CLINICAL DATA:  Chest tube follow-up EXAM: PORTABLE CHEST 1 VIEW COMPARISON:  Two days ago FINDINGS: Left chest tube at the base. Improving left lung aeration, although still asymmetric volume loss and density. Bandlike opacities likely reflecting atelectasis. No evident pneumothorax. Central line on the right with tip at the upper right atrium. The right lung is clear. Normal heart size. Granulomatous calcifications at the left hilum. IMPRESSION: 1. Improved aeration on the left since 2 days ago. 2. Stable chest tube positioning without pneumothorax or increasing pleural fluid. Electronically Signed   By: Marnee SpringJonathon  Watts M.D.   On: 07/24/2018 09:51   Dg Chest Port 1 View  Result Date: 07/22/2018 CLINICAL DATA:  Empyema post left chest tube placement 07/08/2018. EXAM: PORTABLE CHEST 1 VIEW COMPARISON:  Radiographs 07/19/2018.  CT 07/16/2018. FINDINGS: Pigtail catheter overlying the lower left chest is unchanged in position. Possible mild re-accumulation of left pleural effusion with stable underlying left pulmonary opacity and central calcifications. There is no pneumothorax. The right lung is clear. A new right IJ PICC line has been placed, the tip of which is not well visualized although appears to extend to the mid right atrial level. Previous lower cervical fusion  noted. IMPRESSION: 1. Interval possible mild re-accumulation of left pleural effusion following pigtail catheter placement in the left pleural space. No pneumothorax. 2. Right-sided PICC line projects to approximately the mid right atrial level. Electronically Signed   By: Carey BullocksWilliam  Veazey M.D.   On: 07/22/2018 14:44    Labs:  CBC: Recent Labs    07/20/18 0616 07/21/18 0427 07/22/18 0430 07/25/18 0519  WBC 5.7 5.0 5.5 6.0  HGB 7.0* 7.3* 7.6* 7.2*  HCT 24.5* 24.2* 24.6* 24.6*  PLT 462* 500* 563* 651*     COAGS: Recent Labs    02/25/18 1413  05/10/18 0642 05/11/18 0615 05/12/18 0628 05/13/18 0503 07/05/18 1918 07/11/18 1113 07/18/18 1048  INR 1.16  --   --   --   --   --  1.89 1.34 1.34  APTT  --    < > 54* 53* 51* 54*  --   --   --    < > = values in this interval not displayed.    BMP: Recent Labs    07/20/18 0616 07/21/18 0427 07/22/18 0430 07/25/18 0519  NA 139 136 137 137  K 3.3* 3.1* 3.9 3.7  CL 110 103 106 103  CO2 23 24 24 25   GLUCOSE 88 88 92 106*  BUN 5* <5* 5* 5*  CALCIUM 7.7* 7.4* 7.7* 7.8*  CREATININE 0.67 0.64 0.65 0.69  GFRNONAA >60 >60 >60 >60  GFRAA >60 >60 >60 >60    LIVER FUNCTION TESTS: Recent Labs    05/17/18 0659 07/05/18 1918 07/07/18 0835 07/09/18 1725  BILITOT 0.5 0.7 0.4 0.5  AST 20 26 19 17   ALT 16 11 9 8   ALKPHOS 78 96 97 96  PROT 6.2* 6.3* 5.5* 5.8*  ALBUMIN 2.0* 1.8* 1.4* 1.5*    Assessment and Plan: Pt with hx MRSA PNA, left chest empyema; s/p left chest drain 07/08/18; afebrile; WBC nl;hgb 7.2(7.6), creat nl; last pleural fluid /blood cx neg; CT chest today: 1. Significant improvement in the appearance of the left lung, with a decrease in pleural fluid/empyema, following left chest tube placement, when compared to the prior chest CT dated 07/16/2018. 2. No new lung abnormalities. Small left and minimal right pleural effusions. Residual opacity noted in the left lower lobe that may reflect infection or atelectasis or a combination. Mild right posterior lower lobe atelectasis. 3. 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    Electronically Signed: D. Jeananne RamaKevin Laurene Melendrez, PA-C 07/25/2018, 11:56 AM   I spent a total of 15 minutes at the the patient's bedside AND on the patient's hospital floor or unit, greater than 50% of which was counseling/coordinating care for left chest drain    Patient ID: Wayne Buckley,  male   DOB: 07/02/1950, 68 y.o.   MRN: [id number]

## 2018-07-25 NOTE — Progress Notes (Signed)
Report called to Emiliano Dyer at [facility].

## 2018-07-25 NOTE — Discharge Summary (Signed)
[facility] Discharge Summary   Patient: Wayne Buckley ZOX:[id number]   PCP: Patient, No Pcp Per DOB: 04/25/1951   Date of admission: 07/05/2018   Date of discharge:  07/25/2018    Discharge Diagnoses:  Principal Problem:   MRSA bacteremia Active Problems:   Urinary retention   C5-C7 level spinal cord injury [facility])   Essential hypertension   Chronic anemia   Traumatic brain injury (HCC)   Pleural effusion on left   Vertebral osteomyelitis (HCC)   Pressure injury of skin   Acute hypoxemic respiratory failure (HCC)   Atelectasis   Admitted From: SNF Disposition: LTAC  Recommendations for Outpatient Follow-up:  1. Please follow-up with infectious disease in 8 weeks. 2. Patient will need a repeat MRI after completion of IV antibiotics. 3. Patient will also need a repeat CT scan of the chest after completion of his IV antibiotics. 4. Patient will be on chronic oral antibiotic after completion of his IV antibiotics. 5. Please follow-up with neurosurgery after completion of the IV antibiotics. 6. Chest tubes will continue to be under suction, recommendation is to keep the chest tube under suction over the weekend, once the output is less than 50 cc in a day.  Put the chest tube in waterseal and if the output remains under less than 50 cc in the chest tube can be removed.   Contact information for follow-up providers    Barnett Abu, MD. Schedule an appointment as soon as possible for a visit in 2 month(s).   Specialty:  Neurosurgery Contact information: [address] [place] [address] [phone]        [facility]             . Schedule an appointment as soon as possible for a visit in 1 month(s).   Contact information: [address] [place] [address]           Contact information for after-discharge care    Destination    [facility] Preferred SNF .   Service:   Skilled Nursing Contact information: [address] [place] [address] [phone]                 Diet recommendation: Regular diet  Activity: The patient is advised to gradually reintroduce usual activities.  Discharge Condition: good  Code Status: Full code  History of present illness: As per the H and P dictated on admission, "AVIER JECH is a 68 y.o. male with medical history significant of chronic anemia, history of TBI with resultant neurogenic bladder chronic indwelling Foley, DVT, hypertension sent in from nursing home for an abnormal lab today with a hemoglobin of less than 7.  He requires frequent transfusions for the same.  He has not had any overt bleeding no melena but no bright red blood per rectum patient is asymptomatic of this.  He has recently been started on Levaquin 2 days ago for pneumonia.  He denies feeling ill from the pneumonia.  He denies any urinary problems cough shortness of breath.  He denies any fevers however he was noted to have a fever here.  He is heme negative.  Patient referred for admission for fever and some mildly low blood pressures.  Patient has no complaints except he just wants to be able to sleep and is upset that we keep waking him up."  Hospital Course:  Summary of his active problems in the hospital is  as following. Left-sided pneumonia with complex and complicated,  Loculated left effusion acute hypoxic respiratory failure S/P TPA instillation  Respiratory status remained stable.  Follow-up chest CT showed improvement but still significant loculated fluid. Appreciate CCM management Significant improvement with TPA instillation.  No further TPA. Chest tube output is still more than 100. No frank pus and therefore does not meet criteria for Empyema. IR and CCM following. CT of the chest on 07/25/2018 shows significant improvement in appearance of the left lung compared to a prior CT scan in 07/16/2018. No new lung  abnormalities.  Plan is to  continue the chest tube under suction until the output is less than 50 cc/day.  A trial of waterseal and monitoring the output, before removal of the chest tube.  Repeat CT scan down the road into 3-4 weeks after discharge.  Patient is accepted to select LTAC facility.  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   Not a candidate for TEE in the past secondary to possible esophageal obstruction.  Repeat blood cultures no growth, final Continue prolonged IV antibiotics as recommended by infectious disease IV vancomycin, duration 10 weeks, and date 09/14/2018. Patient will follow-up 1 week after hospital discharge with Dr. Luciana Axe. Do not pull PICC line until cleared by ID. After completing his IV antibiotics for 10 weeks patient will also require protracted oral therapy for at least 6 to 12 months. Underwent tunneled PICC line placement. MRI of the whole spine with and without contrast under sedation negative for any C-spine infection. Presence of discitis and osteomyelitis at T10-11, T11-T12, T12-L1, L1-2, L4-5. Paraspinous abscess bilaterally at T11 and T12 extending into psoas muscle bilaterally as well as subdural fluid collection extending from T6-T10. Epidural abscess surrounding T11 and T12 cord. Neurosurgery recommended medical management and there are no plans for surgical intervention.  Repeat imaging of the spine once antibiotics are completed.  He will require sedation for this again with anesthesia.  PMH traumatic brain injury, PMH C5-C7 spinal cord injury, chronic involuntary movements of the extremities, chronic urinary retention with chronic indwelling Foley catheter PT OT recommends SNF.  Essential hypertension Remains stable, continue carvedilol, Norvasc is currently on hold.  Iron deficiency anemia.     Continue iron supplementation.  Status post IV iron.  Status post transfusion 3 units PRBC. Hemoglobin remained stable. Work-up for hemolysis is unremarkable. Continue oral iron continue to monitor H&H.  PMH DVT Patient was on therapeutic Lovenox.  Will transition to Eliquis.  PMH TBI, cord compression.   Resident in SNF.   Chronic Foley for neurogenic bladder. Continue Flomax.  Constipation. Continue stool softeners.  Unstageable pressure injury of the bilateral buttocks present on admission Continue wound care   Pressure Injury 07/10/18 Stage II -  Partial thickness loss of dermis presenting as a shallow open ulcer with a red, pink wound bed without slough. sacrum (Active)  07/10/18 1930  Location: Buttocks  Location Orientation: Left;Right  Staging: Stage II -  Partial thickness loss of dermis presenting as a shallow open ulcer with a red, pink wound bed without slough.  Wound Description (Comments): sacrum  Present on Admission:     Patient was accepted for transfer at [facility]-term [facility] care facility [facility]. On the day of the discharge the patient's vitals were stable, and no other acute medical condition were reported by patient. the patient was felt safe to be discharge at select LTAC with therapy.  Consultants: Infectious disease Neurosurgery Interventional radiology Pulmonary Procedures: Diagnostic left thoracentesis CT-guided left chest tube placement 07/08/2018 MRI under anesthesia Echocardiogram  DISCHARGE MEDICATION: Allergies as of 07/25/2018   No Known Allergies     Medication List    STOP taking these medications   amLODipine 10 MG tablet Commonly known as:  NORVASC   amLODipine 5 MG tablet Commonly known as:  NORVASC   levofloxacin 750 MG tablet Commonly known as:  LEVAQUIN   senna-docusate 8.6-50 MG tablet Commonly known as:  Senokot-S     TAKE these medications   acetaminophen 325 MG tablet Commonly known as:  TYLENOL Take 2 tablets  (650 mg total) by mouth every 6 (six) hours as needed for mild pain.   apixaban 5 MG Tabs tablet Commonly known as:  ELIQUIS Take 1 tablet (5 mg total) by mouth 2 (two) times daily.   ascorbic acid 1000 MG tablet Commonly known as:  VITAMIN C Take 1 tablet (1,000 mg total) by mouth 2 (two) times daily.   bisacodyl 10 MG suppository Commonly known as:  DULCOLAX Place 1 suppository (10 mg total) rectally daily at 6 (six) AM. What changed:    when to take this  reasons to take this   carvedilol 6.25 MG tablet Commonly known as:  COREG Take 1 tablet (6.25 mg total) by mouth 2 (two) times daily with a meal. What changed:    medication strength  how much to take   collagenase ointment Commonly known as:  SANTYL Apply topically daily. Cleanse gluteal wounds with NS.  Apply Santyl to open areas.  Cover with NS moist 2x2 and foam dressings.  Peel back foam and change daily. Foam is changed every three days and PRN soilage.   ferrous sulfate 325 (65 FE) MG tablet Take 1 tablet (325 mg total) by mouth 2 (two) times daily with a meal.   guaiFENesin 600 MG 12 hr tablet Commonly known as:  MUCINEX Take 1 tablet (600 mg total) by mouth 2 (two) times daily.   HYDROcodone-acetaminophen 5-325 MG tablet Commonly known as:  NORCO/VICODIN Take 1 tablet by mouth every 6 (six) hours as needed for moderate pain.   levalbuterol 0.63 MG/3ML nebulizer solution Commonly known as:  XOPENEX Take 3 mLs (0.63 mg total) by nebulization every 8 (eight) hours.   liver oil-zinc oxide 40 % ointment Commonly known as:  DESITIN Apply topically 4 (four) times daily. Apply to sacrum, coccyx, buttocks. What changed:  how much to take   polyethylene glycol packet Commonly known as:  MIRALAX / GLYCOLAX Take 17 g by mouth 2 (two) times daily.   polyethylene glycol packet Commonly known as:  MIRALAX / GLYCOLAX Take 17 g by mouth daily as needed for mild constipation or moderate constipation.   senna  8.6 MG Tabs tablet Commonly known as:  SENOKOT Take 1 tablet (8.6 mg total) by mouth at bedtime.   sodium phosphate 7-19 GM/118ML Enem Place 133 mLs (1 enema total) rectally daily as needed for severe constipation.   tamsulosin 0.4 MG Caps capsule Commonly known as:  FLOMAX Take 1 capsule (0.4 mg total) by mouth daily.   vancomycin 1-5 GM/200ML-% Soln Commonly known as:  VANCOCIN Inject 200 mLs (1,000 mg total) into the vein daily. Start taking on:  July 26, 2018      No Known Allergies Discharge Instructions    Diet - low sodium heart healthy   Complete by:  As directed    Increase activity slowly  Complete by:  As directed      Discharge Exam: Filed Weights   07/05/18 1900 07/06/18 0139  Weight: 114.8 kg 79.1 kg   Vitals:   07/25/18 0015 07/25/18 0843  BP: 116/67 128/71  Pulse: 92 81  Resp: 20   Temp: 98.6 F (37 C) 97.6 F (36.4 C)  SpO2: 94% 96%   General: Appear in mild distress, no Rash; Oral Mucosa moist Cardiovascular: S1 and S2 Present, no Murmur, no JVD Respiratory: Bilateral Air entry present and bilateral  Crackles, no wheezes Abdomen: Bowel Sound present, Soft and no tenderness Extremities: no Pedal edema, no calf tenderness Neurology: Grossly no focal neuro deficit.  Mild aphasia and dysarthria secondary to prior TBI.  Neurogenic bladder.  The results of significant diagnostics from this hospitalization (including imaging, microbiology, ancillary and laboratory) are listed below for reference.    Significant Diagnostic Studies: Dg Chest 1 View  Result Date: 07/06/2018 CLINICAL DATA:  Status post left thoracentesis. EXAM: CHEST  1 VIEW COMPARISON:  07/05/2018 FINDINGS: There is persistent left mid to lower lung zone opacity consistent with combination of pleural fluid and atelectasis or pneumonia. No convincing pneumothorax. Right lung is clear. IMPRESSION: 1. By report, only 90 mL of fluid recovered at thoracentesis. There is no radiographic  change in the appearance of the left lung. 2. No convincing pneumothorax. Electronically Signed   By: Amie Portland M.D.   On: 07/06/2018 12:37   Dg Chest 2 View  Result Date: 07/05/2018 CLINICAL DATA:  68 y/o  M; anemia. Fever. EXAM: CHEST - 2 VIEW COMPARISON:  05/02/2018 chest radiograph. FINDINGS: Large left pleural effusion. Left mid and lower lung zone opacities. Cardiac silhouette partially obscured by the effusion. Aortic calcific atherosclerosis. ACDF hardware noted. Clear right lung. No pneumothorax. Bones are unremarkable. IVC filter in situ. IMPRESSION: Large left pleural effusion. Left mid and lower lung zone opacities may represent associated atelectasis or pneumonia. Electronically Signed   By: Mitzi Hansen M.D.   On: 07/05/2018 22:26   Ct Chest Wo Contrast  Result Date: 07/25/2018 CLINICAL DATA:  Status post percutaneous drainage of a left empyema. Follow-up exam. EXAM: CT CHEST WITHOUT CONTRAST TECHNIQUE: Multidetector CT imaging of the chest was performed following the standard protocol without IV contrast. COMPARISON:  Chest radiographs, most recent dated 07/24/2018. Chest CT, 07/16/2018. FINDINGS: Cardiovascular: Heart is normal in size. No pericardial effusion. 2 vessel coronary artery calcifications. Great vessels are normal in caliber. Minor aortic atherosclerotic calcifications. Mediastinum/Nodes: No neck base or axillary masses or enlarged lymph nodes. There are calcified subcarinal and left hilar lymph nodes that are stable from the prior CT. No mediastinal or hilar masses. No pathologically enlarged lymph nodes. Some secretions are noted in the trachea. Trachea otherwise unremarkable. Esophagus is unremarkable. Lungs/Pleura: Small left and minimal right pleural effusions. Pigtail catheter curls in the posteromedial, inferior left hemithorax. A small amount of fluid tracks along the left oblique fissure. There is dependent opacity in both lower lobes, greater on the left,  consistent with atelectasis, infection or a combination. Linear atelectasis is also noted in the left upper lobe associated with calcifications. Lungs otherwise clear. When compared to the prior CT, left lung consolidation has significantly improved. No evidence of pulmonary edema.  No pneumothorax. Upper Abdomen: No acute abnormality. Musculoskeletal: There is irregular endplate resorption, lower endplate of T11, upper endplate of T12, lower endplate of T12 and upper endplate of L1, with abnormal surrounding soft tissue attenuation, which appears to have progressed when compared to  a thoracic MRI dated 07/08/2018. IMPRESSION: 1. Significant improvement in the appearance of the left lung, with a decrease in pleural fluid/empyema, following left chest tube placement, when compared to the prior chest CT dated 07/16/2018. 2. No new lung abnormalities. Small left and minimal right pleural effusions. Residual opacity noted in the left lower lobe that may reflect infection or atelectasis or a combination. Mild right posterior lower lobe atelectasis. 3. 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 Aortic Atherosclerosis (ICD10-I70.0). Electronically Signed   By: Amie Portlandavid  Ormond M.D.   On: 07/25/2018 10:46   Ct Chest Wo Contrast  Result Date: 07/16/2018 CLINICAL DATA:  Follow-up pleural effusion EXAM: CT CHEST WITHOUT CONTRAST TECHNIQUE: Multidetector CT imaging of the chest was performed following the standard protocol without IV contrast. COMPARISON:  07/06/2018 FINDINGS: Cardiovascular: Normal heart size. No pericardial effusion. Coronary atherosclerotic calcification. Mediastinum/Nodes: Calcified mediastinal and left hilar lymph nodes attributed to old granulomatous disease. Lungs/Pleura: History of empyema. Pleural drain on the left with significant decrease in pleural fluid. There is still patchy loculated pleural disease seen  medially and laterally at the apex and primarily laterally at the base. The largest pocket is at the lateral costophrenic sulcus and measures approximately 10 x 5 cm. Multifocal left lung atelectasis or consolidation. Mild atelectasis and small dependent effusion on the right. Upper Abdomen: No acute finding Musculoskeletal: T10-11 discitis osteomyelitis, known. Erosion has progressed by CT since 10 days ago. Spondylosis and multilevel thoracic ankylosis. IMPRESSION: Left empyema with significant decrease in fluid after chest tube placement. There is still moderate patchy loculated pleural fluid on the left with extensive left-sided atelectasis or consolidation. Electronically Signed   By: Marnee SpringJonathon  Watts M.D.   On: 07/16/2018 08:18   Ct Angio Chest Pe W Or Wo Contrast  Result Date: 07/06/2018 CLINICAL DATA:  Difficulty breathing, positive D-dimer EXAM: CT ANGIOGRAPHY CHEST WITH CONTRAST TECHNIQUE: Multidetector CT imaging of the chest was performed using the standard protocol during bolus administration of intravenous contrast. Multiplanar CT image reconstructions and MIPs were obtained to evaluate the vascular anatomy. CONTRAST:  100mL ISOVUE-370 IOPAMIDOL (ISOVUE-370) INJECTION 76% COMPARISON:  07/06/2018, CT of the thoracic spine from 02/25/2018, CT of the abdomen dated 05/02/2018 FINDINGS: Cardiovascular: Thoracic aorta is well visualize with a normal branching pattern. No aneurysmal dilatation or dissection is seen. Coronary calcifications as well as aortic atherosclerotic calcifications are noted. The pulmonary artery shows a normal branching pattern. No definitive pulmonary emboli are noted. Mediastinum/Nodes: Thoracic inlet is within normal limits. Multiple calcified hilar and mediastinal lymph nodes are noted consistent with prior granulomatous disease. The esophagus is well visualized and within normal limits. Lungs/Pleura: On the left there is considerable upper lobe and lower lobe consolidation with  associated underlying effusion. This is loculated in nature based on recent ultrasound examination from earlier in the same day. No post thoracentesis pneumothorax is seen. The right lung is well expanded with minimal lower lobe atelectatic changes as well as small effusion. A 3 mm nodule is noted in the upper lobe best seen on image number 29 of series 6. This is stable from the recent CT of the thoracic spine. No other definitive nodules are seen. Upper Abdomen: Visualized upper abdomen shows no acute abnormality. Musculoskeletal: Postoperative changes in the cervical spine are noted. Multilevel degenerative changes in the thoracic spine are seen. There are progressive significant erosive changes identified at T11-T12 and T12-L1. These changes are new from the recent CT from  September of 2019 and are highly suspicious for diskitis and underlying osteomyelitis. Considerable surrounding soft tissue prominence is noted related to the underlying inflammatory change. There is some suggestion of encroachment in the spinal canal related to the changes at T11-T12. Review of the MIP images confirms the above findings. IMPRESSION: No evidence of pulmonary emboli. Large loculated left pleural effusion with associated upper and lower lobe consolidation. Small right pleural effusion and basilar atelectasis. Considerable erosive changes at T11-T12 as well as T12-L1 with surrounding soft tissue change highly suspicious for diskitis and underlying osteomyelitis. MRI would be helpful for further evaluation. Some suggestion of soft tissue encroachment into the spinal canal is noted at T11-T12. These changes are new from the prior CT examination from 2 months previous. Critical Value/emergent results were called by telephone at the time of interpretation on 07/06/2018 at 4:58 pm to Dr. Lynden OxfordPRANAV Ruchel Brandenburger , who verbally acknowledged these results. Aortic Atherosclerosis (ICD10-I70.0). Electronically Signed   By: Alcide CleverMark  Lukens M.D.   On:  07/06/2018 17:00   Mr Cervical Spine W Wo Contrast  Result Date: 07/08/2018 CLINICAL DATA:  Back pain.  Rule out spinal infection. EXAM: MRI TOTAL SPINE WITHOUT AND WITH CONTRAST TECHNIQUE: Multisequence MR imaging of the spine from the cervical spine to the sacrum was performed prior to and following IV contrast administration for evaluation of spinal metastatic disease. CONTRAST:  7 mL Gadovist IV COMPARISON:  CT chest 07/06/2018, CT total spine I will 04/2018 FINDINGS: MRI was done under general anesthesia. MRI CERVICAL SPINE FINDINGS Alignment: Normal alignment. Moderate kyphosis of the cervical spine Vertebrae: Negative for fracture or mass. No evidence of cervical discitis. Multilevel degenerative change. ACDF C6-7 with anterior plate Cord: Mild hyperintensity in the cord at C6-7 as noted on the prior study. No cord compression. Posterior Fossa, vertebral arteries, paraspinal tissues: The patient is intubated. Fluid in the pharynx. No abscess or mass in the neck. Disc levels: Advanced multilevel disc and facet degeneration throughout the cervical spine. No cord compression or significant spinal stenosis. Severe left foraminal narrowing due to spurring at C2-3, C3-4, and C4-5 MRI THORACIC SPINE FINDINGS Alignment:  Normal Vertebrae: Negative for fracture. Hemangioma T8 vertebral body unchanged. Bone marrow diffusely low signal on T1 and T2-T2 related to anemia. Recent IV iron therapy. Cord: Normal spinal cord signal. Posterior subdural or epidural fluid collection posteriorly from approximately T6 through T10. Given the findings of disc space infection in the lower thoracic spine, this is most likely abscess. No significant cord deformity. Paraspinal and other soft tissues: Moderately large loculated left pleural effusion. Small right effusion. Paraspinous fluid collections bilaterally at the T12 level most likely paraspinous abscesses. Disc levels: Disc space narrowing, endplate erosion, and endplate  enhancement at T10-11, T11-T12, T12-L1 compatible with discitis and osteomyelitis. Marked degenerative changes of these levels with mild spinal stenosis at T10-11, moderate spinal stenosis at T11-12 and T12-L1. Extensive foraminal encroachment bilaterally T11-12 and T12-L1 due to spurring. There is circumferential epidural thickening and T11 and T12 which is likely due to epidural abscess. MRI LUMBAR SPINE FINDINGS Segmentation:  Normal Alignment:  Normal Vertebrae: Diffusely abnormal bone marrow which is low signal on T1 and T2 compatible with chronic anemia. Recent IV iron infusion therapy. Conus medullaris: Extends to the T12-L1 level. Moderate spinal stenosis at T11-12 and T12-L1 due to spondylosis and soft tissue thickening in the epidural space related to disc space infection. No definite signal abnormality in the conus medullaris. Paraspinal and other soft tissues: Paraspinous fluid collections bilaterally at  T12-L1 extending into the proximal psoas muscle compatible with abscesses Disc levels: Discitis and osteomyelitis at T11-12, T12-L1, L1-2, L4-5. These levels show disc space edema, enhancement and endplate erosions. Erosion most severe at T12-L1. Circumferential epidural thickening and enhancement at T11 and T12 due to abscess. Multilevel spondylosis throughout the lumbar spine. Spinal and foraminal stenosis is seen throughout the lumbar spine most severe at L4-5 where there is moderate to advanced spinal stenosis. IMPRESSION: 1. Negative for infection in the cervical spine. Extensive multilevel spondylosis without significant spinal stenosis. Marked left foraminal narrowing at C2-3, C3-4, C4-5 due to spurring. 2. 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 3. Paraspinous abscesses bilaterally at T11 and T12 extending into the psoas muscle bilaterally. Loculated left effusion most likely infected. 4. Posterior subdural fluid collection extending from T6 through T10 compatible with  abscess. 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 5. Moderate spinal stenosis due to degenerative change throughout the lower thoracic and entire lumbar spine. Moderate stenosis of the canal at T11-T12 and T12-L1. Moderate to severe stenosis at L4-5. Electronically Signed   By: Marlan Palau M.D.   On: 07/08/2018 15:04   Mr Thoracic Spine W Wo Contrast  Result Date: 07/08/2018 CLINICAL DATA:  Back pain.  Rule out spinal infection. EXAM: MRI TOTAL SPINE WITHOUT AND WITH CONTRAST TECHNIQUE: Multisequence MR imaging of the spine from the cervical spine to the sacrum was performed prior to and following IV contrast administration for evaluation of spinal metastatic disease. CONTRAST:  7 mL Gadovist IV COMPARISON:  CT chest 07/06/2018, CT total spine I will 04/2018 FINDINGS: MRI was done under general anesthesia. MRI CERVICAL SPINE FINDINGS Alignment: Normal alignment. Moderate kyphosis of the cervical spine Vertebrae: Negative for fracture or mass. No evidence of cervical discitis. Multilevel degenerative change. ACDF C6-7 with anterior plate Cord: Mild hyperintensity in the cord at C6-7 as noted on the prior study. No cord compression. Posterior Fossa, vertebral arteries, paraspinal tissues: The patient is intubated. Fluid in the pharynx. No abscess or mass in the neck. Disc levels: Advanced multilevel disc and facet degeneration throughout the cervical spine. No cord compression or significant spinal stenosis. Severe left foraminal narrowing due to spurring at C2-3, C3-4, and C4-5 MRI THORACIC SPINE FINDINGS Alignment:  Normal Vertebrae: Negative for fracture. Hemangioma T8 vertebral body unchanged. Bone marrow diffusely low signal on T1 and T2-T2 related to anemia. Recent IV iron therapy. Cord: Normal spinal cord signal. Posterior subdural or epidural fluid collection posteriorly from approximately T6 through T10. Given the findings of disc space infection in the lower thoracic spine, this is most  likely abscess. No significant cord deformity. Paraspinal and other soft tissues: Moderately large loculated left pleural effusion. Small right effusion. Paraspinous fluid collections bilaterally at the T12 level most likely paraspinous abscesses. Disc levels: Disc space narrowing, endplate erosion, and endplate enhancement at T10-11, T11-T12, T12-L1 compatible with discitis and osteomyelitis. Marked degenerative changes of these levels with mild spinal stenosis at T10-11, moderate spinal stenosis at T11-12 and T12-L1. Extensive foraminal encroachment bilaterally T11-12 and T12-L1 due to spurring. There is circumferential epidural thickening and T11 and T12 which is likely due to epidural abscess. MRI LUMBAR SPINE FINDINGS Segmentation:  Normal Alignment:  Normal Vertebrae: Diffusely abnormal bone marrow which is low signal on T1 and T2 compatible with chronic anemia. Recent IV iron infusion therapy. Conus medullaris: Extends to the T12-L1 level. Moderate spinal stenosis at T11-12 and T12-L1 due to spondylosis and soft tissue thickening in the  epidural space related to disc space infection. No definite signal abnormality in the conus medullaris. Paraspinal and other soft tissues: Paraspinous fluid collections bilaterally at T12-L1 extending into the proximal psoas muscle compatible with abscesses Disc levels: Discitis and osteomyelitis at T11-12, T12-L1, L1-2, L4-5. These levels show disc space edema, enhancement and endplate erosions. Erosion most severe at T12-L1. Circumferential epidural thickening and enhancement at T11 and T12 due to abscess. Multilevel spondylosis throughout the lumbar spine. Spinal and foraminal stenosis is seen throughout the lumbar spine most severe at L4-5 where there is moderate to advanced spinal stenosis. IMPRESSION: 1. Negative for infection in the cervical spine. Extensive multilevel spondylosis without significant spinal stenosis. Marked left foraminal narrowing at C2-3, C3-4, C4-5  due to spurring. 2. 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 3. Paraspinous abscesses bilaterally at T11 and T12 extending into the psoas muscle bilaterally. Loculated left effusion most likely infected. 4. Posterior subdural fluid collection extending from T6 through T10 compatible with abscess. 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 5. Moderate spinal stenosis due to degenerative change throughout the lower thoracic and entire lumbar spine. Moderate stenosis of the canal at T11-T12 and T12-L1. Moderate to severe stenosis at L4-5. Electronically Signed   By: Marlan Palau M.D.   On: 07/08/2018 15:04   Mr Lumbar Spine W Wo Contrast  Result Date: 07/08/2018 CLINICAL DATA:  Back pain.  Rule out spinal infection. EXAM: MRI TOTAL SPINE WITHOUT AND WITH CONTRAST TECHNIQUE: Multisequence MR imaging of the spine from the cervical spine to the sacrum was performed prior to and following IV contrast administration for evaluation of spinal metastatic disease. CONTRAST:  7 mL Gadovist IV COMPARISON:  CT chest 07/06/2018, CT total spine I will 04/2018 FINDINGS: MRI was done under general anesthesia. MRI CERVICAL SPINE FINDINGS Alignment: Normal alignment. Moderate kyphosis of the cervical spine Vertebrae: Negative for fracture or mass. No evidence of cervical discitis. Multilevel degenerative change. ACDF C6-7 with anterior plate Cord: Mild hyperintensity in the cord at C6-7 as noted on the prior study. No cord compression. Posterior Fossa, vertebral arteries, paraspinal tissues: The patient is intubated. Fluid in the pharynx. No abscess or mass in the neck. Disc levels: Advanced multilevel disc and facet degeneration throughout the cervical spine. No cord compression or significant spinal stenosis. Severe left foraminal narrowing due to spurring at C2-3, C3-4, and C4-5 MRI THORACIC SPINE FINDINGS Alignment:  Normal Vertebrae: Negative for fracture. Hemangioma T8 vertebral body  unchanged. Bone marrow diffusely low signal on T1 and T2-T2 related to anemia. Recent IV iron therapy. Cord: Normal spinal cord signal. Posterior subdural or epidural fluid collection posteriorly from approximately T6 through T10. Given the findings of disc space infection in the lower thoracic spine, this is most likely abscess. No significant cord deformity. Paraspinal and other soft tissues: Moderately large loculated left pleural effusion. Small right effusion. Paraspinous fluid collections bilaterally at the T12 level most likely paraspinous abscesses. Disc levels: Disc space narrowing, endplate erosion, and endplate enhancement at T10-11, T11-T12, T12-L1 compatible with discitis and osteomyelitis. Marked degenerative changes of these levels with mild spinal stenosis at T10-11, moderate spinal stenosis at T11-12 and T12-L1. Extensive foraminal encroachment bilaterally T11-12 and T12-L1 due to spurring. There is circumferential epidural thickening and T11 and T12 which is likely due to epidural abscess. MRI LUMBAR SPINE FINDINGS Segmentation:  Normal Alignment:  Normal Vertebrae: Diffusely abnormal bone marrow which is low signal on T1 and T2 compatible with chronic anemia. Recent IV iron  infusion therapy. Conus medullaris: Extends to the T12-L1 level. Moderate spinal stenosis at T11-12 and T12-L1 due to spondylosis and soft tissue thickening in the epidural space related to disc space infection. No definite signal abnormality in the conus medullaris. Paraspinal and other soft tissues: Paraspinous fluid collections bilaterally at T12-L1 extending into the proximal psoas muscle compatible with abscesses Disc levels: Discitis and osteomyelitis at T11-12, T12-L1, L1-2, L4-5. These levels show disc space edema, enhancement and endplate erosions. Erosion most severe at T12-L1. Circumferential epidural thickening and enhancement at T11 and T12 due to abscess. Multilevel spondylosis throughout the lumbar spine. Spinal  and foraminal stenosis is seen throughout the lumbar spine most severe at L4-5 where there is moderate to advanced spinal stenosis. IMPRESSION: 1. Negative for infection in the cervical spine. Extensive multilevel spondylosis without significant spinal stenosis. Marked left foraminal narrowing at C2-3, C3-4, C4-5 due to spurring. 2. 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 3. Paraspinous abscesses bilaterally at T11 and T12 extending into the psoas muscle bilaterally. Loculated left effusion most likely infected. 4. Posterior subdural fluid collection extending from T6 through T10 compatible with abscess. 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 5. Moderate spinal stenosis due to degenerative change throughout the lower thoracic and entire lumbar spine. Moderate stenosis of the canal at T11-T12 and T12-L1. Moderate to severe stenosis at L4-5. Electronically Signed   By: Marlan Palau M.D.   On: 07/08/2018 15:04   Ir Fluoro Guide Cv Line Right  Result Date: 07/19/2018 INDICATION: Pneumonia EXAM: TUNNELED RIGHT JUGULAR PICC LINE PLACEMENT WITH ULTRASOUND AND FLUOROSCOPIC GUIDANCE MEDICATIONS: None ANESTHESIA/SEDATION: None FLUOROSCOPY TIME:  Fluoroscopy Time:  minutes 6 seconds (0.3 mGy). COMPLICATIONS: None immediate. PROCEDURE: The patient was advised of the possible risks and complications and agreed to undergo the procedure. The patient was then brought to the angiographic suite for the procedure. The right neck was prepped with chlorhexidine, draped in the usual sterile fashion using maximum barrier technique (cap and mask, sterile gown, sterile gloves, large sterile sheet, hand hygiene and cutaneous antiseptic). Local anesthesia was attained by infiltration with 1% lidocaine. Ultrasound demonstrated patency of the right jugular vein, and this was documented with an image. Under real-time ultrasound guidance, this vein was accessed with a 21 gauge micropuncture needle  and image documentation was performed. The needle was exchanged over a guidewire for a peel-away sheath through which a 23 cm 5 [place] double lumen power injectable PICC was advanced, and positioned with its tip at the lower SVC/right atrial junction. The cuff was positioned in the subcutaneous tract. Fluoroscopy during the procedure and fluoro spot radiograph confirms appropriate catheter position. The catheter was flushed, secured to the skin with Prolene sutures, and covered with a sterile dressing. IMPRESSION: Successful placement of a tunneled right jugular PICC with sonographic and fluoroscopic guidance. The catheter is ready for use. Electronically Signed   By: Jolaine Click M.D.   On: 07/19/2018 13:11   Ir US Guide Vasc Access Right  Result Date: 07/19/2018 INDICATION: Pneumonia EXAM: TUNNELED RIGHT JUGULAR PICC LINE PLACEMENT WITH ULTRASOUND AND FLUOROSCOPIC GUIDANCE MEDICATIONS: None ANESTHESIA/SEDATION: None FLUOROSCOPY TIME:  Fluoroscopy Time:  minutes 6 seconds (0.3 mGy). COMPLICATIONS: None immediate. PROCEDURE: The patient was advised of the possible risks and complications and agreed to undergo the procedure. The patient was then brought to the angiographic suite for the procedure. The right neck was prepped with chlorhexidine, draped in the usual sterile fashion using maximum barrier technique (cap and mask, sterile  gown, sterile gloves, large sterile sheet, hand hygiene and cutaneous antiseptic). Local anesthesia was attained by infiltration with 1% lidocaine. Ultrasound demonstrated patency of the right jugular vein, and this was documented with an image. Under real-time ultrasound guidance, this vein was accessed with a 21 gauge micropuncture needle and image documentation was performed. The needle was exchanged over a guidewire for a peel-away sheath through which a 23 cm 5 [place] double lumen power injectable PICC was advanced, and positioned with its tip at the lower SVC/right atrial  junction. The cuff was positioned in the subcutaneous tract. Fluoroscopy during the procedure and fluoro spot radiograph confirms appropriate catheter position. The catheter was flushed, secured to the skin with Prolene sutures, and covered with a sterile dressing. IMPRESSION: Successful placement of a tunneled right jugular PICC with sonographic and fluoroscopic guidance. The catheter is ready for use. Electronically Signed   By: Jolaine Click M.D.   On: 07/19/2018 13:11   Dg Chest Port 1 View  Result Date: 07/24/2018 CLINICAL DATA:  Chest tube follow-up EXAM: PORTABLE CHEST 1 VIEW COMPARISON:  Two days ago FINDINGS: Left chest tube at the base. Improving left lung aeration, although still asymmetric volume loss and density. Bandlike opacities likely reflecting atelectasis. No evident pneumothorax. Central line on the right with tip at the upper right atrium. The right lung is clear. Normal heart size. Granulomatous calcifications at the left hilum. IMPRESSION: 1. Improved aeration on the left since 2 days ago. 2. Stable chest tube positioning without pneumothorax or increasing pleural fluid. Electronically Signed   By: Marnee Spring M.D.   On: 07/24/2018 09:51   Dg Chest Port 1 View  Result Date: 07/22/2018 CLINICAL DATA:  Empyema post left chest tube placement 07/08/2018. EXAM: PORTABLE CHEST 1 VIEW COMPARISON:  Radiographs 07/19/2018.  CT 07/16/2018. FINDINGS: Pigtail catheter overlying the lower left chest is unchanged in position. Possible mild re-accumulation of left pleural effusion with stable underlying left pulmonary opacity and central calcifications. There is no pneumothorax. The right lung is clear. A new right IJ PICC line has been placed, the tip of which is not well visualized although appears to extend to the mid right atrial level. Previous lower cervical fusion noted. IMPRESSION: 1. Interval possible mild re-accumulation of left pleural effusion following pigtail catheter placement in the  left pleural space. No pneumothorax. 2. Right-sided PICC line projects to approximately the mid right atrial level. Electronically Signed   By: Carey Bullocks M.D.   On: 07/22/2018 14:44   Dg Chest Port 1 View  Result Date: 07/19/2018 CLINICAL DATA:  Pleural effusion. EXAM: PORTABLE CHEST 1 VIEW COMPARISON:  CT 07/16/2017.  Chest x-ray 07/13/2017. FINDINGS: Left chest tube in stable position. Significant improvement in left-sided pleural effusion. Left mid and lower lung atelectatic changes again noted. No pneumothorax. Heart size stable. Prior cervical spine fusion. IMPRESSION: 1. Scratch left chest tube in stable position. Significant improvement in left-sided pleural effusion. 2.  Left mid lung and lower lung atelectatic changes again noted. Electronically Signed   By: Maisie Fus  Register   On: 07/19/2018 06:08   Dg Chest Port 1 View  Result Date: 07/13/2018 CLINICAL DATA:  Pleural effusion follow up EXAM: PORTABLE CHEST 1 VIEW COMPARISON:  Yesterday FINDINGS: Likely decreased left pleural effusion with areas of persistent loculation or pleural thickening from base to apex. Low volume right lung is clear. Stable left chest tube positioning. Stable heart size. No pneumothorax. IMPRESSION: Loculated left pleural fluid with mild decrease from yesterday. Electronically Signed  By: Marnee Spring M.D.   On: 07/13/2018 09:09   Dg Chest Port 1 View  Result Date: 07/12/2018 CLINICAL DATA:  68 year old male with loculated left pleural effusion and bacteremia status post CT-guided left chest tube placement 4 days ago. EXAM: PORTABLE CHEST 1 VIEW COMPARISON:  07/11/2018 and earlier. Chest CTA 07/06/2018. FINDINGS: Portable AP upright view at 0814 hours. Pigtail type left pleural drain remain stable. Moderate volume of loculated appearing residual pleural fluid has not significantly changed over the past several days. Superimposed calcified left hilar and mediastinal lymph nodes again noted. The right lung  appears stable and clear. Cervical ACDF hardware redemonstrated. Negative visible bowel gas pattern. IMPRESSION: Stable pleural drain. Moderate residual loculated left pleural effusion does not appear changed in the last several days. Electronically Signed   By: Odessa Fleming M.D.   On: 07/12/2018 08:34   Dg Chest Port 1 View  Result Date: 07/11/2018 CLINICAL DATA:  Pleural effusion. EXAM: PORTABLE CHEST 1 VIEW COMPARISON:  Chest x-ray 07/10/2018.  CT 07/06/2018. FINDINGS: Left chest tube in stable position. Left pleural effusion unchanged. Underlying left lung atelectasis/infiltrate can not be excluded. Calcified hilar lymph nodes again noted. Heart size stable. Prior cervical spine fusion. IMPRESSION: Left chest tube in stable position. Left pleural effusion stable position. Underlying left lung atelectasis/infiltrate can not be excluded. No pneumothorax. Chest is unchanged from prior exam. Electronically Signed   By: Maisie Fus  Register   On: 07/11/2018 15:30   Dg Chest Port 1 View  Result Date: 07/10/2018 CLINICAL DATA:  Respiratory failure EXAM: PORTABLE CHEST 1 VIEW COMPARISON:  07/06/2018 FINDINGS: Cardiac shadow is stable. New left-sided pleural drain is noted although kinked possibly at the skin surface. Clinical correlation is recommended. Large right-sided pleural effusion remains. Underlying calcified adenopathy is seen. The right lung remains clear. IMPRESSION: New left pleural drain with persistent left pleural effusion. The drain is kinked likely at the skin surface. Clinical correlation is recommended. Electronically Signed   By: Alcide Clever M.D.   On: 07/10/2018 08:08   Ct Image Guided Drainage By Percutaneous Catheter  Result Date: 07/08/2018 INDICATION: 68 year old with bacteremia, pneumonia and loculated left pleural effusion. Request for a chest tube placement with image guidance. EXAM: CT-GUIDED PLACEMENT OF LEFT PLEURAL DRAIN MEDICATIONS: No antibiotics given for this procedure.  ANESTHESIA/SEDATION: 2.0 mg IV Versed 50 mcg IV Fentanyl Moderate Sedation Time:  24 minutes The patient was continuously monitored during the procedure by the interventional radiology nurse under my direct supervision. COMPLICATIONS: None immediate. TECHNIQUE: Informed written consent was obtained from the patient's wife after a thorough discussion of the procedural risks, benefits and alternatives. All questions were addressed. Maximal Sterile Barrier Technique was utilized including caps, mask, sterile gowns, sterile gloves, sterile drape, hand hygiene and skin antiseptic. A timeout was performed prior to the initiation of the procedure. PROCEDURE: Patient was placed on his right side. CT images through the chest were obtained. The left pleural effusion was also evaluated with ultrasound and one of the larger fluid pockets was targeted. Skin was prepped with chlorhexidine and sterile field was created. 18 gauge trocar needle was directed into the pleural space with CT guidance. Amber fluid was aspirated. Stiff Amplatz wire was advanced into the pleural space. Tract was dilated to accommodate a 14 [place] multipurpose drain. Catheter was sutured to skin and attached to a PleurEvac. Greater than 100 mL of amber colored fluid was removed when the catheter was placed to suction. Dressing was placed over the drain. FINDINGS: Complex  left pleural effusion. Fluid is very loculated by ultrasound. Chest tube was placed along the left posterior aspect of the complex effusion. Greater than 100 mL of amber colored fluid was removed. IMPRESSION: Successful placement a left pleural drain with CT guidance. Electronically Signed   By: Richarda Overlie M.D.   On: 07/08/2018 17:52   [place] Ekg Site Rite  Result Date: 07/09/2018 If Site Rite image not attached, placement could not be confirmed due to current cardiac rhythm.  US Thoracentesis Asp Pleural Space W/img Guide  Result Date: 07/06/2018 INDICATION: Patient with history of  chronic anemia, TBI, DVT now admitted for pneumonia - found to have pleural effusion. Request for diagnostic and therapeutic thoracentesis today/ EXAM: ULTRASOUND GUIDED LEFT THORACENTESIS MEDICATIONS: 8 mL 1% lidocaine. COMPLICATIONS: None immediate. PROCEDURE: An ultrasound guided thoracentesis was thoroughly discussed with the patient's wife and questions answered. The benefits, risks, alternatives and complications were also discussed. The patient's wife understands and wishes to proceed with the procedure. Written consent was obtained. Ultrasound was performed to localize and mark an adequate pocket of fluid in the left chest. The area was then prepped and draped in the normal sterile fashion. 1% Lidocaine was used for local anesthesia. Under ultrasound guidance a 6 Fr Safe-T-Centesis catheter was introduced. Thoracentesis was performed. The catheter was removed and a dressing applied. FINDINGS: A total of approximately 90 mL of serosanguineous fluid was removed. Pleural fluid is very loculated on ultrasound, unable to aspirate additional fluid after several attempts at catheter repositioning. Residual fluid remains on post procedure ultrasound examination. Samples were sent to the laboratory as requested by the clinical team. IMPRESSION: Successful ultrasound guided left thoracentesis yielding 90 mL of pleural fluid. Loculated left pleural effusion. Read by Lynnette Caffey, PA-C Electronically Signed   By: Richarda Overlie M.D.   On: 07/06/2018 12:26    Microbiology: No results found for this or any previous visit (from the past 240 hour(s)).   Labs: CBC: Recent Labs  Lab 07/19/18 0245 07/20/18 0616 07/21/18 0427 07/22/18 0430 07/25/18 0519  WBC 7.7 5.7 5.0 5.5 6.0  HGB 7.7* 7.0* 7.3* 7.6* 7.2*  HCT 25.4* 24.5* 24.2* 24.6* 24.6*  MCV 89.1 91.1 90.0 88.2 89.5  PLT 394 462* 500* 563* 651*   Basic Metabolic Panel: Recent Labs  Lab 07/19/18 0245 07/20/18 0616 07/21/18 0427 07/22/18 0430  07/25/18 0519  NA 139 139 136 137 137  K 3.6 3.3* 3.1* 3.9 3.7  CL 111 110 103 106 103  CO2 22 23 24 24 25   GLUCOSE 86 88 88 92 106*  BUN 6* 5* <5* 5* 5*  CREATININE 0.70 0.67 0.64 0.65 0.69  CALCIUM 7.5* 7.7* 7.4* 7.7* 7.8*   Time spent: 35 minutes  Signed:  Lynden Oxford  [facility]  07/25/2018

## 2018-07-25 NOTE — Care Management Note (Signed)
Case Management Note  Patient Details  Name: Wayne Buckley MRN: [id number] Date of Birth: 03-03-1951  Subjective/Objective:    Patient for [facility] today.                Action/Plan: DC to [facility] ([facility]  Expected Discharge Date:  07/08/18               Expected Discharge Plan:  Long Term Acute Care ([facility])  In-House Referral:  Clinical Social Work  Discharge planning Services  CM Consult  Post Acute Care Choice:    Choice offered to:     DME Arranged:    DME Agency:     HH Arranged:    HH Agency:     Status of Service:  Completed, signed off  If discussed at [employer], dates discussed:    Additional Comments:  Leone Haven, RN 07/25/2018, 12:09 PM

## 2018-07-25 NOTE — Discharge Instructions (Addendum)
Bacteremia, Adult Bacteremia is the presence of bacteria in the blood. When bacteria enter the bloodstream, they can cause a life-threatening reaction called sepsis, which is a medical emergency. Bacteremia can spread to other parts of the body, including the heart, joints, and brain. What are the causes? This condition is caused by bacteria that get into the blood.  Bacteria can enter the blood: ? From a skin infection or injury, such as a burn or a cut. ? From a lung infection (pneumonia). ? From an infection in your stomach or intestines (gastrointestinal infection). ? From an infection in your bladder or urinary system (urinary tract infection). ? During a dental or medical procedure. ? From bleeding gums. ? When a bacterial infection in another part of your body spreads to your blood. ? Through an unclean (contaminated) needle. What increases the risk? This condition is more likely to develop in children, the elderly, and people who:  Have a long-term (chronic) disease or condition like diabetes or chronic kidney failure.  Have an artificial joint or heart valve.  Have heart valve disease.  Have a tube inserted to treat a medical condition, such as a urinary catheter or IV.  Have a weak disease-fighting system (immune system).  Inject illegal drugs.  Have been hospitalized for more than 10 days in a row. What are the signs or symptoms? Symptoms of this condition include:  Fever.  Chills.  Fast heartbeat.  Shortness of breath.  Dizziness.  Weakness.  Confusion.  Nausea or vomiting.  Diarrhea.  Low blood pressure.  Decreased urine output. Bacteremia that has spread to other parts of the body may cause symptoms in those areas. In some cases, there are no symptoms. How is this diagnosed? This condition may be diagnosed with a physical exam and tests, such as:  A complete blood count (CBC). This test checks for signs of infection.  Blood cultures. These  check for bacteria in your blood.  Tests of any tubes that you have had inserted. These tests check for a source of infection.  Urine tests, including urine cultures. These check for bacteria in the urine that could be a source of infection.  Imaging tests, such as an X-ray, CT scan, MRI, or heart ultrasound. These check for a source of infection in other parts of your body, such as your lungs, heart valves, or joints. How is this treated? This condition is usually treated in the hospital. Treatment may involve:  Antibiotic medicines. These may be given by mouth (orally) or directly into your blood through an IV (infusion through your vein). ? Depending on the source of infection, you may need antibiotics for several weeks. ? At first, you may be given an antibiotic to kill most types of blood bacteria (broad-spectrum antibiotic). If your test results show that a certain kind of bacteria is causing the problem, you may be given a different antibiotic to kill that specific bacteria.  IV fluids.  Removing any catheter or device that could be a source of infection.  Blood pressure and breathing support, if needed.  Surgery to control the source or the spread of infection, such as surgery to remove an infected device, abscess, or tissue.  Having follow-up visits for medicines, blood tests, and further evaluation. Follow these instructions at home: Medicines  Take over-the-counter and prescription medicines only as told by your health care provider.  If you were prescribed an antibiotic medicine, take it as told by your health care provider. Do not stop taking  the antibiotic even if you start to feel better. General instructions   Rest as needed. Ask your health care provider when you may return to normal activities.  Drink enough fluid to keep your urine pale yellow.  Do not use any products that contain nicotine or tobacco, such as cigarettes and e-cigarettes. If you need help  quitting, ask your health care provider.  Keep all follow-up visits as told by your health care provider. This is important. How is this prevented?   Wash your hands regularly with soap and water. If soap and water are not available, use hand sanitizer.  You should wash your hands: ? After using the toilet or changing a diaper. ? Before preparing, cooking, or serving food. ? While caring for a sick person or while visiting someone in a hospital. ? Before and after changing bandages (dressings) over wounds.  Clean any scrapes or cuts with soap and water and cover them with clean dressings.  Get vaccinations as recommended by your health care provider.  Practice good oral hygiene. Brush your teeth two times a day, and floss regularly.  Take good care of your skin. This includes bathing and moisturizing on a regular basis. Get help right away if you have:  Pain.  A fever or chills.  Trouble breathing.  A fast heart rate.  Skin that is blotchy, pale, or clammy.  Confusion.  Weakness.  Lack of energy (lethargy) or unusual sleepiness.  Diarrhea.  New symptoms that develop after treatment has started. These symptoms may represent a serious problem that is an emergency. Do not wait to see if the symptoms will go away. Get medical help right away. Call your local emergency services (911 in the [place] Do not drive yourself to the hospital. Summary  Bacteremia is the presence of bacteria in the blood. When bacteria enter the bloodstream, they can cause a life-threatening reaction called sepsis.  Some symptoms of bacteremia include fever, chills, shortness of breath, confusion, nausea or vomiting, and diarrhea.  Tests may be done to find the source of infection that led to bacteremia. These tests may include blood tests, urine tests, and imaging tests.  Bacteremia is usually treated with antibiotic medicines in the hospital.  Get help right away if you have any new symptoms  that develop after treatment has started. This information is not intended to replace advice given to you by your health care provider. Make sure you discuss any questions you have with your health care provider. Document Released: 03/19/2006 Document Revised: 10/15/2017 Document Reviewed: 10/15/2017 Elsevier Interactive Patient Education  2019 [employer] Acute Respiratory Distress Syndrome, Adult  Acute respiratory distress syndrome is a life-threatening condition in which fluid collects in the lungs. This prevents the lungs from filling with air and passing oxygen into the blood. This can cause the lungs and other vital organs to fail. The condition usually develops following an infection, illness, surgery, or injury. What are the causes? This condition may be caused by:  An infection, such as sepsis or pneumonia.  A serious injury to the head or chest.  Severe bleeding from an injury.  A major surgery.  Breathing in harmful chemicals or smoke.  Blood transfusions.  A blood clot in the lungs.  Breathing in vomit (aspiration).  Near-drowning.  Inflammation of the pancreas (pancreatitis).  A drug overdose. What are the signs or symptoms? Sudden shortness of breath and rapid breathing are the main symptoms of this condition. Other symptoms may include:  A fast or  irregular heartbeat.  Skin, lips, or fingernails that look blue (cyanosis).  Confusion.  Tiredness or loss of energy.  Chest pain, particularly while taking a breath.  Coughing.  Restlessness or anxiety.  Fever. This is usually present if there is an underlying infection, such as pneumonia. How is this diagnosed? This condition is diagnosed based on:  Your symptoms.  Medical history.  A physical exam. During the exam, your health care provider will listen to your heart and check for crackling or wheezing sounds in your lungs. You may also have other tests to confirm the diagnosis and measure how  well your lungs are working. These may include:  Measuring the amount of oxygen in your blood. Your health care provider will use two methods to do this procedure: ? A small device (pulse oximeter) that is placed on your finger, earlobe, or toe. ? An arterial blood gas test. A sample of blood is taken from an artery and tested for oxygen levels.  Blood tests.  Chest X-rays or CT scans to look for fluid in the lungs.  Taking a sample of your sputum to test for infection.  Heart test, such as an echocardiogram or electrocardiogram. This is done to rule out any heart problems (such as heart failure) that may be causing your symptoms.  Bronchoscopy. During this test, a thin, flexible tube with a light is passed into the mouth or nose, down the windpipe, and into the lungs. How is this treated? Treatment depends on the cause of your condition. The goal is to support you while your lungs heal and the underlying cause is treated. Treatment may include:  Oxygen therapy. This may be done through: ? A tube in your nose or a face mask. ? A ventilator. This device helps move air into and out of your lungs through a breathing tube that is inserted into your mouth or nose.  Continuous positive airway pressure (CPAP). This treatment uses mild air pressure to keep the airways open. A mask or other device will be placed over your nose or mouth.  Tracheostomy. During this procedure, a small cut is made in your neck to create an opening to your windpipe. A breathing tube is placed directly into your windpipe. The breathing tube is connected to a ventilator. This is done if you have problems with your airway or if you need a ventilator for a long period of time.  Positioning you to lie on your stomach (prone position).  Medicines, such as: ? Sedatives to help you relax. ? Blood pressure medicines. ? Antibiotics to treat infection. ? Blood thinners to prevent blood clots. ? Diuretics to help prevent  excess fluid.  Fluids and nutrients given through an IV tube.  Wearing compression stockings on your legs to prevent blood clots.  Extra corporeal membrane oxygenation (ECMO). This treatment takes blood outside your body, adds oxygen, and removes carbon dioxide. The blood is then returned to your body. This treatment is only used in severe cases. Follow these instructions at home:  Take over-the-counter and prescription medicines only as told by your health care provider.  Do not use any products that contain nicotine or tobacco, such as cigarettes and e-cigarettes. If you need help quitting, ask your health care provider.  Limit alcohol intake to no more than 1 drink per day for nonpregnant women and 2 drinks per day for men. One drink equals 12 oz of beer, 5 oz of wine, or 1 oz of hard liquor.  Ask friends and  family to help you if daily activities make you tired.  Attend any pulmonary rehabilitation as told by your health care provider. This may include: ? Education about your condition. ? Exercises. ? Breathing training. ? Counseling. ? Learning techniques to conserve energy. ? Nutrition counseling.  Keep all follow-up visits as told by your health care provider. This is important. Contact a health care provider if:  You become short of breath during activity or while resting.  You develop a cough that does not go away.  You have a fever.  Your symptoms do not get better or they get worse.  You become anxious or depressed. Get help right away if:  You have sudden shortness of breath.  You develop sudden chest pain that does not go away.  You develop a rapid heart rate.  You develop swelling or pain in one of your legs.  You cough up blood.  You have trouble breathing.  Your skin, lips, or fingernails turn blue. These symptoms may represent a serious problem that is an emergency. Do not wait to see if the symptoms will go away. Get medical help right away. Call  your local emergency services (911 in the [place] Do not drive yourself to the hospital. Summary  Acute respiratory distress syndrome is a life-threatening condition in which fluid collects in the lungs, which leads the lungs and other vital organs to fail.  This condition usually develops following an infection, illness, surgery, or injury.  Sudden shortness of breath and rapid breathing are the main symptoms of acute respiratory distress syndrome.  Treatment may include oxygen therapy, continuous positive airway pressure (CPAP), tracheostomy, lying on your stomach (prone position), medicines, fluids and nutrients given through an IV tube, compression stockings, and extra corporeal membrane oxygenation (ECMO). This information is not intended to replace advice given to you by your health care provider. Make sure you discuss any questions you have with your health care provider. Document Released: 06/05/2005 Document Revised: 05/22/2016 Document Reviewed: 05/22/2016 Elsevier Interactive Patient Education  2019 [employer]

## 2018-07-26 ENCOUNTER — Other Ambulatory Visit (HOSPITAL_COMMUNITY): Payer: Self-pay

## 2018-07-26 LAB — CBC
HCT: 26.9 % — ABNORMAL LOW (ref 39.0–52.0)
Hemoglobin: 8.2 g/dL — ABNORMAL LOW (ref 13.0–17.0)
MCH: 26.9 pg (ref 26.0–34.0)
MCHC: 30.5 g/dL (ref 30.0–36.0)
MCV: 88.2 fL (ref 80.0–100.0)
Platelets: 709 10*3/uL — ABNORMAL HIGH (ref 150–400)
RBC: 3.05 MIL/uL — AB (ref 4.22–5.81)
RDW: 16.2 % — ABNORMAL HIGH (ref 11.5–15.5)
WBC: 5.8 10*3/uL (ref 4.0–10.5)
nRBC: 0 % (ref 0.0–0.2)

## 2018-07-26 LAB — COMPREHENSIVE METABOLIC PANEL
ALT: 10 U/L (ref 0–44)
AST: 15 U/L (ref 15–41)
Albumin: 1.4 g/dL — ABNORMAL LOW (ref 3.5–5.0)
Alkaline Phosphatase: 102 U/L (ref 38–126)
Anion gap: 10 (ref 5–15)
BUN: 5 mg/dL — ABNORMAL LOW (ref 8–23)
CHLORIDE: 102 mmol/L (ref 98–111)
CO2: 24 mmol/L (ref 22–32)
Calcium: 8 mg/dL — ABNORMAL LOW (ref 8.9–10.3)
Creatinine, Ser: 0.8 mg/dL (ref 0.61–1.24)
GFR calc Af Amer: >60 mL/min (ref >60)
GFR calc non Af Amer: >60 mL/min (ref >60)
Glucose, Bld: 86 mg/dL (ref 70–99)
Potassium: 3.8 mmol/L (ref 3.5–5.1)
Sodium: 136 mmol/L (ref 135–145)
Total Bilirubin: 0.7 mg/dL (ref 0.3–1.2)
Total Protein: 5.6 g/dL — ABNORMAL LOW (ref 6.5–8.1)

## 2018-07-26 LAB — VANCOMYCIN, TROUGH: Vancomycin Tr: 16 ug/mL (ref 15–20)

## 2018-07-26 LAB — PROTIME-INR
INR: 1.53
Prothrombin Time: 18.2 seconds — ABNORMAL HIGH (ref 11.4–15.2)

## 2018-07-29 LAB — VANCOMYCIN, TROUGH: Vancomycin Tr: 17 ug/mL (ref 15–20)

## 2018-07-30 ENCOUNTER — Other Ambulatory Visit (HOSPITAL_COMMUNITY): Payer: Self-pay

## 2018-07-30 LAB — CBC
HCT: 27.7 % — ABNORMAL LOW (ref 39.0–52.0)
Hemoglobin: 8.6 g/dL — ABNORMAL LOW (ref 13.0–17.0)
MCH: 27.3 pg (ref 26.0–34.0)
MCHC: 31 g/dL (ref 30.0–36.0)
MCV: 87.9 fL (ref 80.0–100.0)
Platelets: 646 10*3/uL — ABNORMAL HIGH (ref 150–400)
RBC: 3.15 MIL/uL — AB (ref 4.22–5.81)
RDW: 15.9 % — ABNORMAL HIGH (ref 11.5–15.5)
WBC: 9.6 10*3/uL (ref 4.0–10.5)
nRBC: 0 % (ref 0.0–0.2)

## 2018-07-30 LAB — BASIC METABOLIC PANEL
Anion gap: 10 (ref 5–15)
BUN: 12 mg/dL (ref 8–23)
CO2: 24 mmol/L (ref 22–32)
Calcium: 8.5 mg/dL — ABNORMAL LOW (ref 8.9–10.3)
Chloride: 102 mmol/L (ref 98–111)
Creatinine, Ser: 0.76 mg/dL (ref 0.61–1.24)
Glucose, Bld: 98 mg/dL (ref 70–99)
Potassium: 3.5 mmol/L (ref 3.5–5.1)
Sodium: 136 mmol/L (ref 135–145)

## 2018-07-30 MED FILL — Medication: Qty: 1 | Status: AC

## 2018-07-31 ENCOUNTER — Other Ambulatory Visit (HOSPITAL_COMMUNITY): Payer: Self-pay

## 2018-07-31 MED ORDER — IOPAMIDOL (ISOVUE-300) INJECTION 61%
INTRAVENOUS | Status: AC
  Filled 2018-07-31: qty 50

## 2018-07-31 MED ORDER — LIDOCAINE VISCOUS HCL 2 % MT SOLN
OROMUCOSAL | Status: AC
  Administered 2018-07-31: 3 mL via OROMUCOSAL
  Filled 2018-07-31: qty 15

## 2018-07-31 MED ORDER — IOPAMIDOL (ISOVUE-300) INJECTION 61%
50.0000 mL | Freq: Once | INTRAVENOUS | Status: AC | PRN
  Administered 2018-07-31: 20 mL

## 2018-07-31 MED ORDER — LIDOCAINE VISCOUS HCL 2 % MT SOLN
15.0000 mL | Freq: Once | OROMUCOSAL | Status: AC
  Administered 2018-07-31: 3 mL via OROMUCOSAL

## 2018-08-02 LAB — VANCOMYCIN, TROUGH: VANCOMYCIN TR: 23 ug/mL — AB (ref 15–20)

## 2018-08-03 LAB — VANCOMYCIN, TROUGH
VANCOMYCIN TR: 13 ug/mL — AB (ref 15–20)
Vancomycin Tr: 21 ug/mL (ref 15–20)

## 2018-08-04 ENCOUNTER — Other Ambulatory Visit (HOSPITAL_COMMUNITY): Payer: Self-pay

## 2018-08-04 LAB — CULTURE, RESPIRATORY

## 2018-08-04 LAB — CULTURE, RESPIRATORY W GRAM STAIN

## 2018-08-05 ENCOUNTER — Other Ambulatory Visit (HOSPITAL_COMMUNITY): Payer: Self-pay

## 2018-08-05 LAB — BLOOD GAS, ARTERIAL
Acid-Base Excess: 1.7 mmol/L (ref 0.0–2.0)
Bicarbonate: 26.5 mmol/L (ref 20.0–28.0)
Delivery systems: POSITIVE
Expiratory PAP: 8
FIO2: 100
Inspiratory PAP: 14
O2 SAT: 94.2 %
PO2 ART: 74.2 mmHg — AB (ref 83.0–108.0)
Patient temperature: 98.6
pCO2 arterial: 48 mmHg (ref 32.0–48.0)
pH, Arterial: 7.361 (ref 7.350–7.450)

## 2018-08-06 ENCOUNTER — Other Ambulatory Visit (HOSPITAL_COMMUNITY): Payer: Self-pay

## 2018-08-06 ENCOUNTER — Encounter: Payer: Self-pay | Admitting: Radiology

## 2018-08-06 DIAGNOSIS — R7881 Bacteremia: Secondary | ICD-10-CM | POA: Diagnosis present

## 2018-08-06 DIAGNOSIS — S14109S Unspecified injury at unspecified level of cervical spinal cord, sequela: Secondary | ICD-10-CM

## 2018-08-06 DIAGNOSIS — B9562 Methicillin resistant Staphylococcus aureus infection as the cause of diseases classified elsewhere: Secondary | ICD-10-CM | POA: Diagnosis present

## 2018-08-06 DIAGNOSIS — J181 Lobar pneumonia, unspecified organism: Secondary | ICD-10-CM | POA: Diagnosis present

## 2018-08-06 DIAGNOSIS — J9621 Acute and chronic respiratory failure with hypoxia: Secondary | ICD-10-CM | POA: Diagnosis present

## 2018-08-06 DIAGNOSIS — S069X5S Unspecified intracranial injury with loss of consciousness greater than 24 hours with return to pre-existing conscious level, sequela: Secondary | ICD-10-CM

## 2018-08-06 LAB — CBC
HCT: 25.5 % — ABNORMAL LOW (ref 39.0–52.0)
Hemoglobin: 7.8 g/dL — ABNORMAL LOW (ref 13.0–17.0)
MCH: 26.4 pg (ref 26.0–34.0)
MCHC: 30.6 g/dL (ref 30.0–36.0)
MCV: 86.4 fL (ref 80.0–100.0)
Platelets: 497 10*3/uL — ABNORMAL HIGH (ref 150–400)
RBC: 2.95 MIL/uL — ABNORMAL LOW (ref 4.22–5.81)
RDW: 15.9 % — ABNORMAL HIGH (ref 11.5–15.5)
WBC: 7.8 10*3/uL (ref 4.0–10.5)
nRBC: 0 % (ref 0.0–0.2)

## 2018-08-06 LAB — COMPREHENSIVE METABOLIC PANEL
ALT: 10 U/L (ref 0–44)
AST: 17 U/L (ref 15–41)
Albumin: 1.5 g/dL — ABNORMAL LOW (ref 3.5–5.0)
Alkaline Phosphatase: 133 U/L — ABNORMAL HIGH (ref 38–126)
Anion gap: 8 (ref 5–15)
BUN: 18 mg/dL (ref 8–23)
CO2: 25 mmol/L (ref 22–32)
Calcium: 8.2 mg/dL — ABNORMAL LOW (ref 8.9–10.3)
Chloride: 100 mmol/L (ref 98–111)
Creatinine, Ser: 0.83 mg/dL (ref 0.61–1.24)
GFR calc Af Amer: >60 mL/min (ref >60)
GFR calc non Af Amer: >60 mL/min (ref >60)
Glucose, Bld: 94 mg/dL (ref 70–99)
Potassium: 3.1 mmol/L — ABNORMAL LOW (ref 3.5–5.1)
Sodium: 133 mmol/L — ABNORMAL LOW (ref 135–145)
Total Bilirubin: 0.5 mg/dL (ref 0.3–1.2)
Total Protein: 5.8 g/dL — ABNORMAL LOW (ref 6.5–8.1)

## 2018-08-06 LAB — BLOOD GAS, ARTERIAL
ACID-BASE EXCESS: 1.3 mmol/L (ref 0.0–2.0)
Bicarbonate: 26 mmol/L (ref 20.0–28.0)
Delivery systems: POSITIVE
EXPIRATORY PAP: 8
FIO2: 100
Inspiratory PAP: 14
Mode: POSITIVE
O2 Saturation: 97.2 %
Patient temperature: 98.6
RATE: 18 resp/min
pCO2 arterial: 45.7 mmHg (ref 32.0–48.0)
pH, Arterial: 7.373 (ref 7.350–7.450)
pO2, Arterial: 89.4 mmHg (ref 83.0–108.0)

## 2018-08-06 LAB — VANCOMYCIN, TROUGH: Vancomycin Tr: 18 ug/mL (ref 15–20)

## 2018-08-06 MED ORDER — IOPAMIDOL (ISOVUE-370) INJECTION 76%
INTRAVENOUS | Status: AC
  Administered 2018-08-06: 100 mL
  Filled 2018-08-06: qty 100

## 2018-08-06 NOTE — Consult Note (Signed)
[facility]ulmonary Critical Care Medicine [facility]  Date of Service: 08/06/2018  PULMONARY CRITICAL CARE CONSULT   Wayne HunJames M Buckley  XLK:[id number]  DOB: 06/04/1951   DOA: 07/25/2018  Referring Physician: Carron CurieAli Hijazi, MD  HPI: Wayne Buckley is a 68 y.o. male seen for follow up of Acute on Chronic Respiratory Failure.  Patient was recently transferred to us for further management he is had some decompensation with increasing oxygen requirements and has been on continuous BiPAP 14/8000 100% FiO2.  Apparently patient has a history of C5-7 spinal cord injury urinary retention chronic anemia history of traumatic brain injury pleural effusions pressure sores MRSA bacteremia who presented to the hospital for hemoglobin of less than 7.  Apparently has been transfused frequently in the past.  Patient was admitted treated was found to have left-sided pneumonia with a complicated loculated effusion and required TPA.  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  Patient was started on antibiotics for this in the form of vancomycin for duration of 10 weeks.  Over the course of the last few days has had decompensation with increased BiPAP requirement and now has been on 24 hours of BiPAP.  Blood gas on 100% showed a PO2 in the 70s  Review of Systems:  ROS performed and is unremarkable other than noted above.  Past Medical History:  Diagnosis Date  . BPH (benign prostatic hyperplasia)   . DVT (deep venous thrombosis) (HCC) 02/2018   extensive LLE DVT  . H/O alcohol abuse   . Hypertension   . Neurogenic bowel   . Spinal cord injury, cervical region (HCC) 02/2018  . TBI (traumatic brain injury) (HCC) 2013   SDH with left partial frontal lobectomy    Past Surgical History:  Procedure Laterality Date  . ANTERIOR CERVICAL DECOMP/DISCECTOMY FUSION N/A 02/27/2018   Procedure: ANTERIOR CERVICAL DECOMPRESSION/DISCECTOMY FUSION  C6-7;  Surgeon: Donalee Citrinram, Gary, MD;  [facility];  Service: Neurosurgery;  Laterality: N/A;  . CRANIOTOMY  04/28/2012   Procedure: CRANIOTOMY HEMATOMA EVACUATION SUBDURAL;  Surgeon: Reinaldo Meekerandy O Kritzer, MD;  Location: [facility] OR;  Service: Neurosurgery;  Laterality: Left;  Left Craniotomy for Subdural Hematoma  . IR FLUORO GUIDE CV LINE RIGHT  05/14/2018  . IR FLUORO GUIDE CV LINE RIGHT  07/19/2018  . IR US GUIDE VASC ACCESS RIGHT  05/14/2018  . IR US GUIDE VASC ACCESS RIGHT  07/19/2018  . RADIOLOGY WITH ANESTHESIA N/A 02/27/2018   Procedure: MRI WITH ANESTHESIA;  Surgeon: Radiologist, Medication, MD;  Location: [facility] OR;  Service: Radiology;  Laterality: N/A;  . RADIOLOGY WITH ANESTHESIA N/A 07/08/2018   Procedure: MRI WITH ANESTHESIA/CERVICAL THORASTIC/LUMBAR SPINE WITH AND WITHOUT CONTRAST;  Surgeon: Radiologist, Medication, MD;  Location: [facility] OR;  Service: Radiology;  Laterality: N/A;  . VENA CAVA FILTER PLACEMENT Right 02/27/2018   Procedure: INSERTION VENA-CAVA FILTER;  Surgeon: Nada LibmanBrabham, Vance W, MD;  Location: [facility] OR;  Service: Vascular;  Laterality: Right;    Social History:    reports that he has never smoked. He has never used smokeless tobacco. He reports current alcohol use. No history on file for drug.  Family History: Non-Contributory to the present illness  No Known Allergies  Medications: Reviewed on Rounds  Physical Exam:  Vitals: Temperature 99.0 pulse 98 respiratory 25 blood pressure 128/76 saturations 98%  Ventilator Settings BiPAP FiO2 100% IPAP 14 EPAP 8  . General: Comfortable at this time . Eyes: Grossly normal lids,  irises & conjunctiva . ENT: grossly tongue is normal . Neck: no obvious mass . Cardiovascular: S1-S2 normal no gallop or rub is noted at this time . Respiratory: Coarse breath sounds diminished at the bases . Abdomen: Soft and nontender . Skin: no rash seen on limited exam . Musculoskeletal: not rigid . Psychiatric:unable to assess . Neurologic: no seizure  no involuntary movements         Labs on Admission:  Basic Metabolic Panel: Recent Labs  Lab 07/30/18 1309 08/05/18 2311  NA 136 133*  K 3.5 3.1*  CL 102 100  CO2 24 25  GLUCOSE 98 94  BUN 12 18  CREATININE 0.76 0.83  CALCIUM 8.5* 8.2*    Recent Labs  Lab 08/05/18 2142 08/06/18 1030  PHART 7.361 7.373  PCO2ART 48.0 45.7  PO2ART 74.2* 89.4  HCO3 26.5 26.0  O2SAT 94.2 97.2    Liver Function Tests: Recent Labs  Lab 08/05/18 2311  AST 17  ALT 10  ALKPHOS 133*  BILITOT 0.5  PROT 5.8*  ALBUMIN 1.5*   No results for input(s): LIPASE, AMYLASE in the last 168 hours. No results for input(s): AMMONIA in the last 168 hours.  CBC: Recent Labs  Lab 07/30/18 1309 08/05/18 2311  WBC 9.6 7.8  HGB 8.6* 7.8*  HCT 27.7* 25.5*  MCV 87.9 86.4  PLT 646* 497*    Cardiac Enzymes: No results for input(s): CKTOTAL, CKMB, CKMBINDEX, TROPONINI in the last 168 hours.  BNP (last 3 results) No results for input(s): BNP in the last 8760 hours.  ProBNP (last 3 results) No results for input(s): PROBNP in the last 8760 hours.   Radiological Exams on Admission: Ct Angio Chest Pe W Or Wo Contrast  Result Date: 08/06/2018 CLINICAL DATA:  Acute hypoxic respiratory failure EXAM: CT ANGIOGRAPHY CHEST WITH CONTRAST TECHNIQUE: Multidetector CT imaging of the chest was performed using the standard protocol during bolus administration of intravenous contrast. Multiplanar CT image reconstructions and MIPs were obtained to evaluate the vascular anatomy. CONTRAST:  ISOVUE-370 IOPAMIDOL (ISOVUE-370) INJECTION 76% COMPARISON:  07/25/2018 FINDINGS: Cardiovascular: Satisfactory opacification of the pulmonary arteries to the segmental level. No evidence of pulmonary embolism. Normal heart size. No pericardial effusion. Mediastinum/Nodes: Subcarinal and left mediastinal/hilar calcified lymph nodes usually from remote granulomatous disease. Lungs/Pleura: Extensive endobronchial debris in the right  middle and lower lobes which are collapsed. There is also atelectasis in the left lower lobe. Small pleural effusions, larger on the left where there is a pleural catheter. Calcified granuloma in the left lung. Upper Abdomen: IVC filter and feeding tube. 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 There is extensive paravertebral phlegmon. Canal collection seen on most recent MRI 07/08/2018. No clear progression by CT. Review of the MIP images confirms the above findings. IMPRESSION: 1. Negative for pulmonary embolism. 2. Right middle and lower lobe collapse with extensive endobronchial debris, consider aspiration. 3. Left pleural catheter with small pleural effusion that is dependent and unchanged. 4. Known multilevel lower thoracic discitis osteomyelitis Electronically Signed   By: Marnee Spring M.D.   On: 08/06/2018 04:27   Dg Chest Port 1 View  Result Date: 08/05/2018 CLINICAL DATA:  68 year old male with respiratory failure. Sepsis, loculated left pleural effusion. CT-guided left chest tube placement last month. Thoracic discitis osteomyelitis. EXAM: PORTABLE CHEST 1 VIEW COMPARISON:  Portable chest 08/04/2018 and earlier. Chest CT 07/25/2018 FINDINGS: Portable AP semi upright view at 2134 hours. Enteric feeding tube, right IJ central line  and pigtail left chest tube are in place. No endotracheal tube. Improved left lung ventilation since yesterday. No pneumothorax. Mild residual confluent left lung base opacity. Increased streaky opacity at the right lung base. No pulmonary edema. Stable cardiac size and mediastinal contours. Calcified mediastinal lymph nodes again noted. Prior cervical ACDF. IMPRESSION: 1. Stable lines and tubes. 2. Increased streaky opacity at the right lung base, which could be atelectasis or infection. 3. Improved left lung ventilation since yesterday.  No pneumothorax. Electronically Signed   By: Odessa Fleming M.D.   On: 08/05/2018 21:51    Dg Chest Port 1 View  Result Date: 08/04/2018 CLINICAL DATA:  Chest tube in place EXAM: PORTABLE CHEST 1 VIEW COMPARISON:  July 30, 2018 FINDINGS: Drainage catheter is present in the medial left base region, stable. No evident pneumothorax. Loculated fluid is noted in the left base with consolidation medially. Areas of atelectatic change in the left mid and lower lung zones are also present. There is overall volume loss on the left. There is new patchy infiltrate in the right base. Central catheter tip is in the right atrium slightly distal to the cavoatrial junction. Feeding tube tip is below the diaphragm. There is postoperative change in the lower cervical region. No evident adenopathy. There is aortic atherosclerosis. IMPRESSION: Tube and catheter positions are unchanged. No pneumothorax. There is new right base airspace consolidation, felt to represent pneumonia or possibly focus of aspiration. Fluid in the left base with medial left base consolidation remains. There is patchy atelectasis on the left as well. Stable cardiac silhouette. Aortic Atherosclerosis (ICD10-I70.0). Electronically Signed   By: Bretta Bang III M.D.   On: 08/04/2018 07:49    Assessment/Plan Active Problems:   Acute on chronic respiratory failure with hypoxia (HCC)   Spinal cord injury, cervical region [facility])   TBI (traumatic brain injury) (HCC)   Lobar pneumonia, unspecified organism (HCC)   MRSA bacteremia   1. Acute on chronic respiratory failure with hypoxia patient's oxygenation has not really improved significantly he is not hypercarbic my concern is that if he is still hypoxic he needs to be reintubated.  Last chest x-ray which was done yesterday revealed no pneumothorax new right basilar airspace consolidation which could be aspiration or worsening of pneumonia. 2. MRSA bacteremia treated patient is on chronic antibiotics vancomycin for 10 weeks total 3. Traumatic brain injury grossly  unchanged 4. Vertebral osteomyelitis involving multiple segments on antibiotics which will be continued. 5. New right lower lobe pneumonia patient is on antibiotics  I have personally seen and evaluated the patient, evaluated laboratory and imaging results, formulated the assessment and plan and placed orders. The Patient requires high complexity decision making for assessment and support.  Case was discussed on Rounds with the Respiratory Therapy Staff Time Spent  Yevonne Pax, MD [facility] Pulmonary Critical Care Medicine Sleep Medicine

## 2018-08-07 DIAGNOSIS — I469 Cardiac arrest, cause unspecified: Secondary | ICD-10-CM

## 2018-08-07 NOTE — Progress Notes (Signed)
Pulmonary Critical Care Medicine [facility] [place]   PULMONARY CRITICAL CARE SERVICE  PROGRESS NOTE  Date of Service: 08/07/2018  Wayne Buckley  GXQ:[id number]  DOB: 1951-01-21   DOA: 07/25/2018  Referring Physician: Carron Curie, MD  HPI: Wayne Buckley is a 68 y.o. male seen for follow up of Acute on Chronic Respiratory Failure.  Patient currently is on BiPAP was attempted at weaning on the FiO2 however did not tolerate so was placed back on the higher flow rate of 100%.  Patient needs to be intubated however family has not made any decisions at this time  Medications: Reviewed on Rounds  Physical Exam:  Vitals: Temperature 96.6 pulse 81 respiratory rate 24 blood pressure 113/54 saturations 100%  Ventilator Settings BiPAP FiO2 100% IPAP 14 EPAP 8 tidal volume 683  . General: Comfortable at this time . Eyes: Grossly normal lids, irises & conjunctiva . ENT: grossly tongue is normal . Neck: no obvious mass . Cardiovascular: S1 S2 normal no gallop . Respiratory: Scattered rhonchi expansion is equal at this time . Abdomen: soft . Skin: no rash seen on limited exam . Musculoskeletal: not rigid . Psychiatric:unable to assess . Neurologic: no seizure no involuntary movements         Lab Data:   Basic Metabolic Panel: Recent Labs  Lab 08/05/18 2311  NA 133*  K 3.1*  CL 100  CO2 25  GLUCOSE 94  BUN 18  CREATININE 0.83  CALCIUM 8.2*    ABG: Recent Labs  Lab 08/05/18 2142 08/06/18 1030  PHART 7.361 7.373  PCO2ART 48.0 45.7  PO2ART 74.2* 89.4  HCO3 26.5 26.0  O2SAT 94.2 97.2    Liver Function Tests: Recent Labs  Lab 08/05/18 2311  AST 17  ALT 10  ALKPHOS 133*  BILITOT 0.5  PROT 5.8*  ALBUMIN 1.5*   No results for input(s): LIPASE, AMYLASE in the last 168 hours. No results for input(s): AMMONIA in the last 168 hours.  CBC: Recent Labs  Lab 08/05/18 2311  WBC 7.8  HGB 7.8*  HCT 25.5*  MCV 86.4  PLT 497*    Cardiac Enzymes: No results  for input(s): CKTOTAL, CKMB, CKMBINDEX, TROPONINI in the last 168 hours.  BNP (last 3 results) No results for input(s): BNP in the last 8760 hours.  ProBNP (last 3 results) No results for input(s): PROBNP in the last 8760 hours.  Radiological Exams: Ct Angio Chest Pe W Or Wo Contrast  Result Date: 08/06/2018 CLINICAL DATA:  Acute hypoxic respiratory failure EXAM: CT ANGIOGRAPHY CHEST WITH CONTRAST TECHNIQUE: Multidetector CT imaging of the chest was performed using the standard protocol during bolus administration of intravenous contrast. Multiplanar CT image reconstructions and MIPs were obtained to evaluate the vascular anatomy. CONTRAST:  ISOVUE-370 IOPAMIDOL (ISOVUE-370) INJECTION 76% COMPARISON:  07/25/2018 FINDINGS: Cardiovascular: Satisfactory opacification of the pulmonary arteries to the segmental level. No evidence of pulmonary embolism. Normal heart size. No pericardial effusion. Mediastinum/Nodes: Subcarinal and left mediastinal/hilar calcified lymph nodes usually from remote granulomatous disease. Lungs/Pleura: Extensive endobronchial debris in the right middle and lower lobes which are collapsed. There is also atelectasis in the left lower lobe. Small pleural effusions, larger on the left where there is a pleural catheter. Calcified granuloma in the left lung. Upper Abdomen: IVC filter and feeding tube. 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 There is extensive paravertebral phlegmon. Canal collection seen on most recent MRI 07/08/2018. No clear progression by CT. Review of  the MIP images confirms the above findings. IMPRESSION: 1. Negative for pulmonary embolism. 2. Right middle and lower lobe collapse with extensive endobronchial debris, consider aspiration. 3. Left pleural catheter with small pleural effusion that is dependent and unchanged. 4. Known multilevel lower thoracic discitis osteomyelitis Electronically Signed   By:  Marnee Spring M.D.   On: 08/06/2018 04:27   Dg Chest Port 1 View  Result Date: 08/05/2018 CLINICAL DATA:  68 year old male with respiratory failure. Sepsis, loculated left pleural effusion. CT-guided left chest tube placement last month. Thoracic discitis osteomyelitis. EXAM: PORTABLE CHEST 1 VIEW COMPARISON:  Portable chest 08/04/2018 and earlier. Chest CT 07/25/2018 FINDINGS: Portable AP semi upright view at 2134 hours. Enteric feeding tube, right IJ central line and pigtail left chest tube are in place. No endotracheal tube. Improved left lung ventilation since yesterday. No pneumothorax. Mild residual confluent left lung base opacity. Increased streaky opacity at the right lung base. No pulmonary edema. Stable cardiac size and mediastinal contours. Calcified mediastinal lymph nodes again noted. Prior cervical ACDF. IMPRESSION: 1. Stable lines and tubes. 2. Increased streaky opacity at the right lung base, which could be atelectasis or infection. 3. Improved left lung ventilation since yesterday.  No pneumothorax. Electronically Signed   By: Odessa Fleming M.D.   On: 08/05/2018 21:51    Assessment/Plan Active Problems:   Acute on chronic respiratory failure with hypoxia (HCC)   Spinal cord injury, cervical region [facility])   TBI (traumatic brain injury) (HCC)   Lobar pneumonia, unspecified organism (HCC)   MRSA bacteremia   1. Acute on chronic respiratory failure with hypoxia we will continue with the supportive care on BiPAP continue pulmonary toilet. 2. Spinal cord injury at baseline no improvement 3. TBI unchanged 4. Lobar pneumonia treated with antibiotics.  Follow-up x-ray results noted 5. MRSA bacteremia treated clinically improving hemodynamically stable   I have personally seen and evaluated the patient, evaluated laboratory and imaging results, formulated the assessment and plan and placed orders. The Patient requires high complexity decision making for assessment and support.  Case was  discussed on Rounds with the Respiratory Therapy Staff  Yevonne Pax, MD [facility] Pulmonary Critical Care Medicine Sleep Medicine

## 2018-08-08 ENCOUNTER — Other Ambulatory Visit (HOSPITAL_COMMUNITY): Payer: Self-pay

## 2018-08-08 DIAGNOSIS — Z789 Other specified health status: Secondary | ICD-10-CM

## 2018-08-08 LAB — BASIC METABOLIC PANEL
Anion gap: 13 (ref 5–15)
BUN: 9 mg/dL (ref 8–23)
CO2: 19 mmol/L — ABNORMAL LOW (ref 22–32)
Calcium: 8.3 mg/dL — ABNORMAL LOW (ref 8.9–10.3)
Chloride: 106 mmol/L (ref 98–111)
Creatinine, Ser: 0.94 mg/dL (ref 0.61–1.24)
GFR calc Af Amer: >60 mL/min (ref >60)
GFR calc non Af Amer: >60 mL/min (ref >60)
GLUCOSE: 83 mg/dL (ref 70–99)
Potassium: 3.9 mmol/L (ref 3.5–5.1)
Sodium: 138 mmol/L (ref 135–145)

## 2018-08-08 LAB — BLOOD GAS, ARTERIAL
Acid-Base Excess: 0.1 mmol/L (ref 0.0–2.0)
Acid-base deficit: 1.6 mmol/L (ref 0.0–2.0)
Bicarbonate: 25 mmol/L (ref 20.0–28.0)
Bicarbonate: 22.7 mmol/L (ref 20.0–28.0)
Expiratory PAP: 8
FIO2: 100
FIO2: 100
INSPIRATORY PAP: 14
LHR: 16 {breaths}/min
MECHVT: 440 mL
Mode: POSITIVE
O2 Saturation: 98.4 %
O2 Saturation: 99.4 %
PEEP: 8 cmH2O
Patient temperature: 97.4
Patient temperature: 98.6
pCO2 arterial: 44.7 mmHg (ref 32.0–48.0)
pCO2 arterial: 38.6 mmHg (ref 32.0–48.0)
pH, Arterial: 7.363 (ref 7.350–7.450)
pH, Arterial: 7.387 (ref 7.350–7.450)
pO2, Arterial: 103 mmHg (ref 83.0–108.0)
pO2, Arterial: 156 mmHg — ABNORMAL HIGH (ref 83.0–108.0)

## 2018-08-08 LAB — CBC
HEMATOCRIT: 27.5 % — AB (ref 39.0–52.0)
Hemoglobin: 8.1 g/dL — ABNORMAL LOW (ref 13.0–17.0)
MCH: 26 pg (ref 26.0–34.0)
MCHC: 29.5 g/dL — ABNORMAL LOW (ref 30.0–36.0)
MCV: 88.4 fL (ref 80.0–100.0)
Platelets: 538 10*3/uL — ABNORMAL HIGH (ref 150–400)
RBC: 3.11 MIL/uL — ABNORMAL LOW (ref 4.22–5.81)
RDW: 16.1 % — ABNORMAL HIGH (ref 11.5–15.5)
WBC: 7.2 10*3/uL (ref 4.0–10.5)
nRBC: 0 % (ref 0.0–0.2)

## 2018-08-08 NOTE — Progress Notes (Signed)
Pulmonary Critical Care Medicine [facility] [place]   PULMONARY CRITICAL CARE SERVICE  PROGRESS NOTE  Date of Service: 08/08/2018  Wayne Buckley  ZOX:[id number]  DOB: 27-May-1951   DOA: 07/25/2018  Referring Physician: Carron Curie, MD  HPI: Wayne Buckley is a 68 y.o. male seen for follow up of Acute on Chronic Respiratory Failure.  Patient was intubated on the ventilator right now remains on assist control has been on 100% FiO2 currently with a PEEP of 5.  Chest x-ray was reviewed it shows that there is worsening of the left infiltrate versus pneumonia versus atelectasis.  Patient's wife was present in the room spoke with her explained to her that he is critically ill at this time and were going to try to optimize his medical management.  He was also hypotensive and therefore started on Levophed.  Patient was also given fluid boluses  Medications: Reviewed on Rounds  Physical Exam:  Vitals: Temperature 98.0 pulse 94 respiratory 30 blood pressure 78/36 saturations 87%  Ventilator Settings mode ventilation assist control FiO2 100% tidal volume 600 PEEP 5  . General: Comfortable at this time . Eyes: Grossly normal lids, irises & conjunctiva . ENT: grossly tongue is normal . Neck: no obvious mass . Cardiovascular: S1 S2 normal no gallop . Respiratory: Distant rhonchi noted bilaterally diminished on the left . Abdomen: soft . Skin: no rash seen on limited exam . Musculoskeletal: not rigid . Psychiatric:unable to assess . Neurologic: no seizure no involuntary movements         Lab Data:   Basic Metabolic Panel: Recent Labs  Lab 08/05/18 2311 08/08/18 0457  NA 133* 138  K 3.1* 3.9  CL 100 106  CO2 25 19*  GLUCOSE 94 83  BUN 18 9  CREATININE 0.83 0.94  CALCIUM 8.2* 8.3*    ABG: Recent Labs  Lab 08/05/18 2142 08/06/18 1030 08/08/18 0850  PHART 7.361 7.373 7.363  PCO2ART 48.0 45.7 44.7  PO2ART 74.2* 89.4 103  HCO3 26.5 26.0 25.0  O2SAT 94.2 97.2 98.4     Liver Function Tests: Recent Labs  Lab 08/05/18 2311  AST 17  ALT 10  ALKPHOS 133*  BILITOT 0.5  PROT 5.8*  ALBUMIN 1.5*   No results for input(s): LIPASE, AMYLASE in the last 168 hours. No results for input(s): AMMONIA in the last 168 hours.  CBC: Recent Labs  Lab 08/05/18 2311 08/08/18 0457  WBC 7.8 7.2  HGB 7.8* 8.1*  HCT 25.5* 27.5*  MCV 86.4 88.4  PLT 497* 538*    Cardiac Enzymes: No results for input(s): CKTOTAL, CKMB, CKMBINDEX, TROPONINI in the last 168 hours.  BNP (last 3 results) No results for input(s): BNP in the last 8760 hours.  ProBNP (last 3 results) No results for input(s): PROBNP in the last 8760 hours.  Radiological Exams: Dg Chest Port 1 View  Result Date: 08/08/2018 CLINICAL DATA:  Intubated. EXAM: PORTABLE CHEST 1 VIEW COMPARISON:  Earlier today. Chest CTA dated 08/06/2018. FINDINGS: Endotracheal tube tip 4.6 cm above the carina. Right jugular catheter tip in inferior aspect of the superior vena cava. Feeding tube extending into the stomach. Significant increase in pleural and parenchymal opacity on the left. Interval minimal ill-defined right lower lung zone opacity. The left lower chest pigtail catheter is unchanged with the exception of lack of previously suspected kinking. The cardiac silhouette remains borderline enlarged. Multiple calcified lymph nodes are again demonstrated. Thoracic spine degenerative changes and cervical spine fixation hardware. IMPRESSION: 1. Significant increase  in pleural fluid and atelectasis/pneumonia on the left. 2. Interval minimal patchy right basilar atelectasis or pneumonia. Electronically Signed   By: Beckie Salts M.D.   On: 08/08/2018 09:48   Dg Chest Port 1 View  Result Date: 08/08/2018 CLINICAL DATA:  Respiratory failure. EXAM: PORTABLE CHEST 1 VIEW COMPARISON:  08/05/2018. FINDINGS: Significant worsening aeration. Decreased lung volumes, and increased LEFT basilar opacity likely representing combination of  atelectasis and worsening effusion. Pigtail catheter unchanged position but kinked in its midportion. RIGHT basilar subsegmental atelectasis. Increasing fluid at the LEFT lung apex. Feeding tube traverses the esophagus. Cardiomediastinal silhouette is increased. The central venous catheter tip lies within the RIGHT atrium. IMPRESSION: Worsening aeration, particularly LEFT lower lobe process is increased. LEFT pigtail catheter appears kinked. Correlate clinically for fluid output. Electronically Signed   By: Elsie Stain M.D.   On: 08/08/2018 09:07    Assessment/Plan Active Problems:   Acute on chronic respiratory failure with hypoxia (HCC)   Spinal cord injury, cervical region [facility])   TBI (traumatic brain injury) (HCC)   Lobar pneumonia, unspecified organism (HCC)   MRSA bacteremia   1. Acute on chronic respiratory failure with hypoxia with acute decompensation requiring intubation mechanical ventilation.  Right now is on 100% FiO2 we will try to titrate PEEP up and reduce the FiO2 if possible.  He did have a drop in his blood pressure recommended we will stop the propofol switch over to fentanyl Versed for sedation. 2. Spinal cord injury cervical region prognosis guarded 3. Atelectasis with possible pneumonia versus fluid on the left side we will get a CT scan of the chest once patient is more stable patient is also on antibiotics which will be continued consider doing a bronchoscopy for airway evaluation once he is more hemodynamically stable. 4. MRSA bacteremia patient is on vancomycin as well as Zosyn at this time.   I have personally seen and evaluated the patient, evaluated laboratory and imaging results, formulated the assessment and plan and placed orders.  Patient is critically ill in danger of cardiac arrest and death critical care time is 35 minutes The Patient requires high complexity decision making for assessment and support.  Case was discussed on Rounds with the Respiratory  Therapy Staff  Yevonne Pax, MD [facility] Pulmonary Critical Care Medicine Sleep Medicine

## 2018-08-09 ENCOUNTER — Other Ambulatory Visit (HOSPITAL_COMMUNITY): Payer: Self-pay

## 2018-08-09 NOTE — Progress Notes (Signed)
Pulmonary Critical Care Medicine [facility] [place]   PULMONARY CRITICAL CARE SERVICE  PROGRESS NOTE  Date of Service: 08/09/2018  Wayne Buckley  MNO:[id number]  DOB: 01/27/1951   DOA: 07/25/2018  Referring Physician: Carron Curie, MD  HPI: Wayne Buckley is a 68 y.o. male seen for follow up of Acute on Chronic Respiratory Failure.  Patient was looking better today the respiratory therapist went ahead and started him on a pressure support when he did about 2 hours on the pressure support.  I also got a chest x-ray which I just reviewed and it looks like there is much improvement as far as aeration is concerned on the left side.  Medications: Reviewed on Rounds  Physical Exam:  Vitals: Temperature 96.8 pulse 78 respiratory 19 blood pressure 159/61   Ventilator Settings mode ventilation assist control FiO2 35% tidal volume 451 PEEP 5  . General: Comfortable at this time . Eyes: Grossly normal lids, irises & conjunctiva . ENT: grossly tongue is normal . Neck: no obvious mass . Cardiovascular: S1 S2 normal no gallop . Respiratory: No rhonchi or rales are noted at this time . Abdomen: soft . Skin: no rash seen on limited exam . Musculoskeletal: not rigid . Psychiatric:unable to assess . Neurologic: no seizure no involuntary movements         Lab Data:   Basic Metabolic Panel: Recent Labs  Lab 08/05/18 2311 08/08/18 0457  NA 133* 138  K 3.1* 3.9  CL 100 106  CO2 25 19*  GLUCOSE 94 83  BUN 18 9  CREATININE 0.83 0.94  CALCIUM 8.2* 8.3*    ABG: Recent Labs  Lab 08/05/18 2142 08/06/18 1030 08/08/18 0850 08/08/18 1225  PHART 7.361 7.373 7.363 7.387  PCO2ART 48.0 45.7 44.7 38.6  PO2ART 74.2* 89.4 103 156*  HCO3 26.5 26.0 25.0 22.7  O2SAT 94.2 97.2 98.4 99.4    Liver Function Tests: Recent Labs  Lab 08/05/18 2311  AST 17  ALT 10  ALKPHOS 133*  BILITOT 0.5  PROT 5.8*  ALBUMIN 1.5*   No results for input(s): LIPASE, AMYLASE in the last 168  hours. No results for input(s): AMMONIA in the last 168 hours.  CBC: Recent Labs  Lab 08/05/18 2311 08/08/18 0457  WBC 7.8 7.2  HGB 7.8* 8.1*  HCT 25.5* 27.5*  MCV 86.4 88.4  PLT 497* 538*    Cardiac Enzymes: No results for input(s): CKTOTAL, CKMB, CKMBINDEX, TROPONINI in the last 168 hours.  BNP (last 3 results) No results for input(s): BNP in the last 8760 hours.  ProBNP (last 3 results) No results for input(s): PROBNP in the last 8760 hours.  Radiological Exams: Dg Chest Port 1 View  Result Date: 08/08/2018 CLINICAL DATA:  Intubated. EXAM: PORTABLE CHEST 1 VIEW COMPARISON:  Earlier today. Chest CTA dated 08/06/2018. FINDINGS: Endotracheal tube tip 4.6 cm above the carina. Right jugular catheter tip in inferior aspect of the superior vena cava. Feeding tube extending into the stomach. Significant increase in pleural and parenchymal opacity on the left. Interval minimal ill-defined right lower lung zone opacity. The left lower chest pigtail catheter is unchanged with the exception of lack of previously suspected kinking. The cardiac silhouette remains borderline enlarged. Multiple calcified lymph nodes are again demonstrated. Thoracic spine degenerative changes and cervical spine fixation hardware. IMPRESSION: 1. Significant increase in pleural fluid and atelectasis/pneumonia on the left. 2. Interval minimal patchy right basilar atelectasis or pneumonia. Electronically Signed   By: Zada Finders.D.  On: 08/08/2018 09:48   Dg Chest Port 1 View  Result Date: 08/08/2018 CLINICAL DATA:  Respiratory failure. EXAM: PORTABLE CHEST 1 VIEW COMPARISON:  08/05/2018. FINDINGS: Significant worsening aeration. Decreased lung volumes, and increased LEFT basilar opacity likely representing combination of atelectasis and worsening effusion. Pigtail catheter unchanged position but kinked in its midportion. RIGHT basilar subsegmental atelectasis. Increasing fluid at the LEFT lung apex. Feeding tube  traverses the esophagus. Cardiomediastinal silhouette is increased. The central venous catheter tip lies within the RIGHT atrium. IMPRESSION: Worsening aeration, particularly LEFT lower lobe process is increased. LEFT pigtail catheter appears kinked. Correlate clinically for fluid output. Electronically Signed   By: Elsie Stain M.D.   On: 08/08/2018 09:07    Assessment/Plan Active Problems:   Acute on chronic respiratory failure with hypoxia (HCC)   Spinal cord injury, cervical region [facility])   TBI (traumatic brain injury) (HCC)   Lobar pneumonia, unspecified organism (HCC)   MRSA bacteremia   1. Acute on chronic respiratory failure with hypoxia we will continue with the wean gradually on pressure support.  Patient is chest x-ray is looking much better.  Suspect that it may have been related to the chest tube being kinked or clamped which now looks better. 2. Spinal cord injury at baseline continue with supportive care 3. TBI unchanged 4. Lobar pneumonia clinically improving 5. MRSA bacteremia treated   I have personally seen and evaluated the patient, evaluated laboratory and imaging results, formulated the assessment and plan and placed orders. The Patient requires high complexity decision making for assessment and support.  Case was discussed on Rounds with the Respiratory Therapy Staff  Yevonne Pax, MD [facility] Pulmonary Critical Care Medicine Sleep Medicine

## 2018-08-10 LAB — VANCOMYCIN, TROUGH: Vancomycin Tr: 16 ug/mL (ref 15–20)

## 2018-08-10 NOTE — Progress Notes (Addendum)
Pulmonary Critical Care Medicine [facility] [place]   PULMONARY CRITICAL CARE SERVICE  PROGRESS NOTE  Date of Service: 08/10/2018  Wayne Buckley  MBT:[id number]  DOB: [birth year]   DOA: 07/25/2018  Referring Physician: Carron Curie, MD  HPI: Wayne Buckley is a 68 y.o. male seen for follow up of Acute on Chronic Respiratory Failure.  Patient remains intubated via endotracheal tube.  He was able to tolerate 2 hours of pressure support yesterday.  Overall today he is doing well and we are hoping to allow him 12 hours on pressure support today.  Medications: Reviewed on Rounds  Physical Exam:  Vitals: Pulse 95 respirations 16 BP 112/60 O2 sat 96% temp 97.6  Ventilator Settings patient currently on pressure support 12/5 28% FiO2. When on ventilator he is on AC VC rate of 16 tidal volume 450 PEEP of 5 FiO2 28%  . General: Comfortable at this time . Eyes: Grossly normal lids, irises & conjunctiva . ENT: grossly tongue is normal . Neck: no obvious mass . Cardiovascular: S1 S2 normal no gallop . Respiratory: No rales or rhonchi noted . Abdomen: soft . Skin: no rash seen on limited exam . Musculoskeletal: not rigid . Psychiatric:unable to assess . Neurologic: no seizure no involuntary movements         Lab Data:   Basic Metabolic Panel: Recent Labs  Lab 08/05/18 2311 08/08/18 0457  NA 133* 138  K 3.1* 3.9  CL 100 106  CO2 25 19*  GLUCOSE 94 83  BUN 18 9  CREATININE 0.83 0.94  CALCIUM 8.2* 8.3*    ABG: Recent Labs  Lab 08/05/18 2142 08/06/18 1030 08/08/18 0850 08/08/18 1225  PHART 7.361 7.373 7.363 7.387  PCO2ART 48.0 45.7 44.7 38.6  PO2ART 74.2* 89.4 103 156*  HCO3 26.5 26.0 25.0 22.7  O2SAT 94.2 97.2 98.4 99.4    Liver Function Tests: Recent Labs  Lab 08/05/18 2311  AST 17  ALT 10  ALKPHOS 133*  BILITOT 0.5  PROT 5.8*  ALBUMIN 1.5*   No results for input(s): LIPASE, AMYLASE in the last 168 hours. No results for input(s): AMMONIA in the last  168 hours.  CBC: Recent Labs  Lab 08/05/18 2311 08/08/18 0457  WBC 7.8 7.2  HGB 7.8* 8.1*  HCT 25.5* 27.5*  MCV 86.4 88.4  PLT 497* 538*    Cardiac Enzymes: No results for input(s): CKTOTAL, CKMB, CKMBINDEX, TROPONINI in the last 168 hours.  BNP (last 3 results) No results for input(s): BNP in the last 8760 hours.  ProBNP (last 3 results) No results for input(s): PROBNP in the last 8760 hours.  Radiological Exams: Dg Chest Port 1 View  Result Date: 08/09/2018 CLINICAL DATA:  Pneumonia. EXAM: PORTABLE CHEST 1 VIEW COMPARISON:  Chest x-ray from yesterday. FINDINGS: Endotracheal tube 3.7 cm above the carina. Unchanged feeding tube with tip below the field of view. Unchanged right internal jugular central venous catheter with tip in the right atrium. Unchanged left pigtail chest tube. Stable cardiomediastinal silhouette. Worsening right basilar airspace disease with new right-sided pleural effusion. Improved left pleural effusion and left lung airspace disease with residual airspace disease and layering pleural fluid at the left lung base. No pneumothorax. No acute osseous abnormality. IMPRESSION: 1. Worsening right basilar atelectasis/infiltrate with new right-sided pleural effusion. 2. Improved left pleural effusion and left lung airspace disease with residual left lower lobe atelectasis/infiltrate. Electronically Signed   By: Obie Dredge M.D.   On: 08/09/2018 12:48    Assessment/Plan Active  Problems:   Acute on chronic respiratory failure with hypoxia (HCC)   Spinal cord injury, cervical region [facility])   TBI (traumatic brain injury) (HCC)   Lobar pneumonia, unspecified organism (HCC)   MRSA bacteremia   1. Acute on chronic respiratory failure with hypoxia continue with weaning gradually on pressure support.  Chest x-ray is improved. 2. Spinal cord injury at baseline continue supportive care 3. TBI unchanged 4. Lobar pneumonia clinically improving 5. MRSA bacteremia  treated   I have personally seen and evaluated the patient, evaluated laboratory and imaging results, formulated the assessment and plan and placed orders. The Patient requires high complexity decision making for assessment and support.  Case was discussed on Rounds with the Respiratory Therapy Staff  Yevonne Pax, MD [facility] Pulmonary Critical Care Medicine Sleep Medicine

## 2018-08-11 NOTE — Progress Notes (Addendum)
Pulmonary Critical Care Medicine [facility] [place]   PULMONARY CRITICAL CARE SERVICE  PROGRESS NOTE  Date of Service: 08/11/2018  Wayne Buckley  [id number]  DOB: 1950-12-29   DOA: 07/25/2018  Referring Physician: Carron Curie, MD  HPI: Wayne Buckley is a 68 y.o. male seen for follow up of Acute on Chronic Respiratory Failure.  Patient remains intubated via ET tube.  Yesterday was able to tolerate 12 hours on pressure support.  The goal today is somewhere between 12 and 16 hours.  His sedation continues to be weaned and he appears to be doing well.  Patient will likely need trach so an ENT consult will be placed.  Medications: Reviewed on Rounds  Physical Exam:  Vitals: Pulse 77 respirations 15 BP 169/89 O2 sat 100% temp 97.2  Ventilator Settings ventilator mode AC VC rate of 16 tidal volume 450 PEEP of 5 FiO2 28%  . General: Comfortable at this time . Eyes: Grossly normal lids, irises & conjunctiva . ENT: grossly tongue is normal . Neck: no obvious mass . Cardiovascular: S1 S2 normal no gallop . Respiratory: No rales or rhonchi noted . Abdomen: soft . Skin: no rash seen on limited exam . Musculoskeletal: not rigid . Psychiatric:unable to assess . Neurologic: no seizure no involuntary movements         Lab Data:   Basic Metabolic Panel: Recent Labs  Lab 08/05/18 2311 08/08/18 0457  NA 133* 138  K 3.1* 3.9  CL 100 106  CO2 25 19*  GLUCOSE 94 83  BUN 18 9  CREATININE 0.83 0.94  CALCIUM 8.2* 8.3*    ABG: Recent Labs  Lab 08/05/18 2142 08/06/18 1030 08/08/18 0850 08/08/18 1225  PHART 7.361 7.373 7.363 7.387  PCO2ART 48.0 45.7 44.7 38.6  PO2ART 74.2* 89.4 103 156*  HCO3 26.5 26.0 25.0 22.7  O2SAT 94.2 97.2 98.4 99.4    Liver Function Tests: Recent Labs  Lab 08/05/18 2311  AST 17  ALT 10  ALKPHOS 133*  BILITOT 0.5  PROT 5.8*  ALBUMIN 1.5*   No results for input(s): LIPASE, AMYLASE in the last 168 hours. No results for input(s):  AMMONIA in the last 168 hours.  CBC: Recent Labs  Lab 08/05/18 2311 08/08/18 0457  WBC 7.8 7.2  HGB 7.8* 8.1*  HCT 25.5* 27.5*  MCV 86.4 88.4  PLT 497* 538*    Cardiac Enzymes: No results for input(s): CKTOTAL, CKMB, CKMBINDEX, TROPONINI in the last 168 hours.  BNP (last 3 results) No results for input(s): BNP in the last 8760 hours.  ProBNP (last 3 results) No results for input(s): PROBNP in the last 8760 hours.  Radiological Exams: No results found.  Assessment/Plan Active Problems:   Acute on chronic respiratory failure with hypoxia (HCC)   Spinal cord injury, cervical region [facility])   TBI (traumatic brain injury) (HCC)   Lobar pneumonia, unspecified organism (HCC)   MRSA bacteremia   1. Acute on chronic respiratory failure with hypoxia continue with weaning gradually on pressure support.  Chest x-ray is improved.  ENT consult placed for trach placement. 2. Spinal cord injury at baseline continue supportive care 3. TBI unchanged 4. Lobar pneumonia clinically improving 5. MRSA bacteremia treated.   I have personally seen and evaluated the patient, evaluated laboratory and imaging results, formulated the assessment and plan and placed orders. The Patient requires high complexity decision making for assessment and support.  Case was discussed on Rounds with the Respiratory Therapy Staff  Yevonne Pax,  MD [facility] Pulmonary Critical Care Medicine Sleep Medicine

## 2018-08-12 NOTE — Progress Notes (Signed)
Pulmonary Critical Care Medicine [facility] [place]   PULMONARY CRITICAL CARE SERVICE  PROGRESS NOTE  Date of Service: 08/12/2018  Wayne Buckley  [id number]  DOB: Apr 01, 1951   DOA: 07/25/2018  Referring Physician: Carron Curie, MD  HPI: Wayne Buckley is a 68 y.o. male seen for follow up of Acute on Chronic Respiratory Failure.  Remains intubated on the ventilator awaiting ENT to do tracheostomy right now is on pressure support mode with a goal of 16 hours  Medications: Reviewed on Rounds  Physical Exam:  Vitals: Temperature 97.4 pulse 85 respiratory rate 19 blood pressure 117/63 saturations 100%  Ventilator Settings mode ventilation pressure support FiO2 28% tidal volume 446 pressure support 12 PEEP 5  . General: Comfortable at this time . Eyes: Grossly normal lids, irises & conjunctiva . ENT: grossly tongue is normal . Neck: no obvious mass . Cardiovascular: S1 S2 normal no gallop . Respiratory: No rhonchi or rales are noted at this time . Abdomen: soft . Skin: no rash seen on limited exam . Musculoskeletal: not rigid . Psychiatric:unable to assess . Neurologic: no seizure no involuntary movements         Lab Data:   Basic Metabolic Panel: Recent Labs  Lab 08/05/18 2311 08/08/18 0457  NA 133* 138  K 3.1* 3.9  CL 100 106  CO2 25 19*  GLUCOSE 94 83  BUN 18 9  CREATININE 0.83 0.94  CALCIUM 8.2* 8.3*    ABG: Recent Labs  Lab 08/05/18 2142 08/06/18 1030 08/08/18 0850 08/08/18 1225  PHART 7.361 7.373 7.363 7.387  PCO2ART 48.0 45.7 44.7 38.6  PO2ART 74.2* 89.4 103 156*  HCO3 26.5 26.0 25.0 22.7  O2SAT 94.2 97.2 98.4 99.4    Liver Function Tests: Recent Labs  Lab 08/05/18 2311  AST 17  ALT 10  ALKPHOS 133*  BILITOT 0.5  PROT 5.8*  ALBUMIN 1.5*   No results for input(s): LIPASE, AMYLASE in the last 168 hours. No results for input(s): AMMONIA in the last 168 hours.  CBC: Recent Labs  Lab 08/05/18 2311 08/08/18 0457  WBC 7.8 7.2   HGB 7.8* 8.1*  HCT 25.5* 27.5*  MCV 86.4 88.4  PLT 497* 538*    Cardiac Enzymes: No results for input(s): CKTOTAL, CKMB, CKMBINDEX, TROPONINI in the last 168 hours.  BNP (last 3 results) No results for input(s): BNP in the last 8760 hours.  ProBNP (last 3 results) No results for input(s): PROBNP in the last 8760 hours.  Radiological Exams: No results found.  Assessment/Plan Active Problems:   Acute on chronic respiratory failure with hypoxia (HCC)   Spinal cord injury, cervical region [facility])   TBI (traumatic brain injury) (HCC)   Lobar pneumonia, unspecified organism (HCC)   MRSA bacteremia   1. Acute on chronic respiratory failure hypoxia we will continue with full support on the ventilator at 16-hour goal pressure support.  Will need a tracheostomy awaiting ENT 2. Spinal cord injury unchanged we will continue supportive care 3. TBI remains unchanged 4. Lobar pneumonia treated clinically improved 5. MRSA bacteremia resolved   I have personally seen and evaluated the patient, evaluated laboratory and imaging results, formulated the assessment and plan and placed orders. The Patient requires high complexity decision making for assessment and support.  Case was discussed on Rounds with the Respiratory Therapy Staff  Yevonne Pax, MD [facility] Pulmonary Critical Care Medicine Sleep Medicine

## 2018-08-13 ENCOUNTER — Other Ambulatory Visit (HOSPITAL_COMMUNITY): Payer: Self-pay

## 2018-08-13 LAB — BASIC METABOLIC PANEL
Anion gap: 6 (ref 5–15)
BUN: 5 mg/dL — ABNORMAL LOW (ref 8–23)
CO2: 22 mmol/L (ref 22–32)
Calcium: 7.6 mg/dL — ABNORMAL LOW (ref 8.9–10.3)
Chloride: 108 mmol/L (ref 98–111)
Creatinine, Ser: 0.8 mg/dL (ref 0.61–1.24)
GFR calc Af Amer: >60 mL/min (ref >60)
GFR calc non Af Amer: >60 mL/min (ref >60)
Glucose, Bld: 99 mg/dL (ref 70–99)
Potassium: 2.3 mmol/L — CL (ref 3.5–5.1)
Sodium: 136 mmol/L (ref 135–145)

## 2018-08-13 LAB — CBC
HCT: 25 % — ABNORMAL LOW (ref 39.0–52.0)
Hemoglobin: 7.7 g/dL — ABNORMAL LOW (ref 13.0–17.0)
MCH: 26.6 pg (ref 26.0–34.0)
MCHC: 30.8 g/dL (ref 30.0–36.0)
MCV: 86.2 fL (ref 80.0–100.0)
Platelets: 455 10*3/uL — ABNORMAL HIGH (ref 150–400)
RBC: 2.9 MIL/uL — ABNORMAL LOW (ref 4.22–5.81)
RDW: 16.4 % — ABNORMAL HIGH (ref 11.5–15.5)
WBC: 7.1 10*3/uL (ref 4.0–10.5)
nRBC: 0 % (ref 0.0–0.2)

## 2018-08-13 NOTE — Progress Notes (Signed)
Pulmonary Critical Care Medicine [facility] [place]   PULMONARY CRITICAL CARE SERVICE  PROGRESS NOTE  Date of Service: 08/13/2018  Wayne Buckley  [id number]  DOB: 1951-04-28   DOA: 07/25/2018  Referring Physician: Carron Curie, MD  HPI: Wayne Buckley is a 68 y.o. male seen for follow up of Acute on Chronic Respiratory Failure.  Weaning tracheostomy in the meantime he is tolerating pressure support fairly well  Medications: Reviewed on Rounds  Physical Exam:  Vitals: Temperature 97.5 pulse 81 respiratory 18 blood pressure 108/62 saturations 97%  Ventilator Settings on pressure support FiO2 28% tidal volume 580 pressure 12 PEEP 5  . General: Comfortable at this time . Eyes: Grossly normal lids, irises & conjunctiva . ENT: grossly tongue is normal . Neck: no obvious mass . Cardiovascular: S1 S2 normal no gallop . Respiratory: Scattered rhonchi expansion tube . Abdomen: soft . Skin: no rash seen on limited exam . Musculoskeletal: not rigid . Psychiatric:unable to assess . Neurologic: no seizure no involuntary movements         Lab Data:   Basic Metabolic Panel: Recent Labs  Lab 08/08/18 0457 08/13/18 0537  NA 138 136  K 3.9 2.3*  CL 106 108  CO2 19* 22  GLUCOSE 83 99  BUN 9 5*  CREATININE 0.94 0.80  CALCIUM 8.3* 7.6*    ABG: Recent Labs  Lab 08/08/18 0850 08/08/18 1225  PHART 7.363 7.387  PCO2ART 44.7 38.6  PO2ART 103 156*  HCO3 25.0 22.7  O2SAT 98.4 99.4    Liver Function Tests: No results for input(s): AST, ALT, ALKPHOS, BILITOT, PROT, ALBUMIN in the last 168 hours. No results for input(s): LIPASE, AMYLASE in the last 168 hours. No results for input(s): AMMONIA in the last 168 hours.  CBC: Recent Labs  Lab 08/08/18 0457 08/13/18 0537  WBC 7.2 7.1  HGB 8.1* 7.7*  HCT 27.5* 25.0*  MCV 88.4 86.2  PLT 538* 455*    Cardiac Enzymes: No results for input(s): CKTOTAL, CKMB, CKMBINDEX, TROPONINI in the last 168 hours.  BNP (last 3  results) No results for input(s): BNP in the last 8760 hours.  ProBNP (last 3 results) No results for input(s): PROBNP in the last 8760 hours.  Radiological Exams: Dg Chest Port 1 View  Result Date: 08/13/2018 CLINICAL DATA:  ETT,HX PNEUMONIA,LOCULATED EFFUSION,CHEST TUBE EXAM: PORTABLE CHEST 1 VIEW COMPARISON:  Chest x-rays dated 08/09/2018 and 08/08/2018. FINDINGS: Endotracheal tube is well positioned with tip just above the level of the carina. Enteric tube passes below the diaphragm. RIGHT-sided central line is stable in position with tip at the level of the cavoatrial junction. LEFT-sided chest tube is stable in position with tip directed towards the medial portion of the LEFT lung base. Stable bibasilar opacities, likely atelectasis and/or small pleural effusions. No new lung findings. No pneumothorax seen. IMPRESSION: Stable chest x-ray. Support apparatus appears stable in position. Electronically Signed   By: Bary Richard M.D.   On: 08/13/2018 09:01    Assessment/Plan Active Problems:   Acute on chronic respiratory failure with hypoxia (HCC)   Spinal cord injury, cervical region [facility])   TBI (traumatic brain injury) (HCC)   Lobar pneumonia, unspecified organism (HCC)   MRSA bacteremia   1. Acute on chronic respiratory failure with hypoxia we will continue with weaning on pressure support for now he needs to have a tracheostomy because patient is not able to protect his airway. 2. Spinal cord injury unchanged 3. Traumatic brain injury remains nonverbal 4.  Lobar pneumonia treated follow-up x-ray as noted above 5. MRSA bacteremia treated   I have personally seen and evaluated the patient, evaluated laboratory and imaging results, formulated the assessment and plan and placed orders. The Patient requires high complexity decision making for assessment and support.  Case was discussed on Rounds with the Respiratory Therapy Staff  Yevonne Pax, MD [facility] Pulmonary Critical Care  Medicine Sleep Medicine

## 2018-08-14 LAB — POTASSIUM: Potassium: 3.5 mmol/L (ref 3.5–5.1)

## 2018-08-14 NOTE — Progress Notes (Addendum)
Pulmonary Critical Care Medicine [facility] [place]   PULMONARY CRITICAL CARE SERVICE  PROGRESS NOTE  Date of Service: 08/14/2018  Wayne Buckley  [id number]  DOB: June 18, 1951   DOA: 07/25/2018  Referring Physician: Carron Curie, MD  HPI: Wayne Buckley is a 68 y.o. male seen for follow up of Acute on Chronic Respiratory Failure.  Patient is currently doing well remains intubated with ET tube.  ENT came for consult today and they report that he will likely be trached next week.  He had a goal of 16 hours on pressure support today with an FiO2 28%.  Medications: Reviewed on Rounds  Physical Exam:  Vitals: Pulse 83 respirations 17 BP 134/82 O2 sat 94% temp 98.6  Ventilator Settings ventilator mode pressure support 12/5 FiO2 28%  . General: Comfortable at this time . Eyes: Grossly normal lids, irises & conjunctiva . ENT: grossly tongue is normal . Neck: no obvious mass . Cardiovascular: S1 S2 normal no gallop . Respiratory: Scattered rhonchi . Abdomen: soft . Skin: no rash seen on limited exam . Musculoskeletal: not rigid . Psychiatric:unable to assess . Neurologic: no seizure no involuntary movements         Lab Data:   Basic Metabolic Panel: Recent Labs  Lab 08/08/18 0457 08/13/18 0537 08/14/18 0556  NA 138 136  --   K 3.9 2.3* 3.5  CL 106 108  --   CO2 19* 22  --   GLUCOSE 83 99  --   BUN 9 5*  --   CREATININE 0.94 0.80  --   CALCIUM 8.3* 7.6*  --     ABG: Recent Labs  Lab 08/08/18 0850 08/08/18 1225  PHART 7.363 7.387  PCO2ART 44.7 38.6  PO2ART 103 156*  HCO3 25.0 22.7  O2SAT 98.4 99.4    Liver Function Tests: No results for input(s): AST, ALT, ALKPHOS, BILITOT, PROT, ALBUMIN in the last 168 hours. No results for input(s): LIPASE, AMYLASE in the last 168 hours. No results for input(s): AMMONIA in the last 168 hours.  CBC: Recent Labs  Lab 08/08/18 0457 08/13/18 0537  WBC 7.2 7.1  HGB 8.1* 7.7*  HCT 27.5* 25.0*  MCV 88.4 86.2  PLT  538* 455*    Cardiac Enzymes: No results for input(s): CKTOTAL, CKMB, CKMBINDEX, TROPONINI in the last 168 hours.  BNP (last 3 results) No results for input(s): BNP in the last 8760 hours.  ProBNP (last 3 results) No results for input(s): PROBNP in the last 8760 hours.  Radiological Exams: Dg Chest Port 1 View  Result Date: 08/13/2018 CLINICAL DATA:  ETT,HX PNEUMONIA,LOCULATED EFFUSION,CHEST TUBE EXAM: PORTABLE CHEST 1 VIEW COMPARISON:  Chest x-rays dated 08/09/2018 and 08/08/2018. FINDINGS: Endotracheal tube is well positioned with tip just above the level of the carina. Enteric tube passes below the diaphragm. RIGHT-sided central line is stable in position with tip at the level of the cavoatrial junction. LEFT-sided chest tube is stable in position with tip directed towards the medial portion of the LEFT lung base. Stable bibasilar opacities, likely atelectasis and/or small pleural effusions. No new lung findings. No pneumothorax seen. IMPRESSION: Stable chest x-ray. Support apparatus appears stable in position. Electronically Signed   By: Bary Richard M.D.   On: 08/13/2018 09:01    Assessment/Plan Active Problems:   Acute on chronic respiratory failure with hypoxia (HCC)   Spinal cord injury, cervical region [facility])   TBI (traumatic brain injury) (HCC)   Lobar pneumonia, unspecified organism (HCC)   MRSA  bacteremia   1. Acute on chronic respiratory failure with hypoxia continue with weaning on pressure support for now, awaiting tracheostomy next week.  Seen by ENT today. 2. Spinal cord injury unchanged 3. Traumatic brain injury remains nonverbal 4. Lobar pneumonia treated follow-up x-ray as noted 5. MRSA bacteremia treated   I have personally seen and evaluated the patient, evaluated laboratory and imaging results, formulated the assessment and plan and placed orders. The Patient requires high complexity decision making for assessment and support.  Case was discussed on Rounds  with the Respiratory Therapy Staff  Yevonne Pax, MD [facility] Pulmonary Critical Care Medicine Sleep Medicine

## 2018-08-15 NOTE — Progress Notes (Addendum)
Pulmonary Critical Care Medicine [facility] [place]   PULMONARY CRITICAL CARE SERVICE  PROGRESS NOTE  Date of Service: 08/15/2018  Wayne Buckley  [id number]  DOB: 12-25-1950   DOA: 07/25/2018  Referring Physician: Carron Curie, MD  HPI: Wayne Buckley is a 68 y.o. male seen for follow up of Acute on Chronic Respiratory Failure.  Patient continues to be intubated and resting on pressure support 12/5 FiO2 20%.  Patient is scheduled for tracheostomy on Monday.  Medications: Reviewed on Rounds  Physical Exam:  Vitals: 84 respirations 20 BP 122/73 O2 sat 98% temp 98.3  Ventilator Settings ventilator mode pressure support 12/5 FiO2 28%.  . General: Comfortable at this time . Eyes: Grossly normal lids, irises & conjunctiva . ENT: grossly tongue is normal . Neck: no obvious mass . Cardiovascular: S1 S2 normal no gallop . Respiratory: Coarse breath sounds . Abdomen: soft . Skin: no rash seen on limited exam . Musculoskeletal: not rigid . Psychiatric:unable to assess . Neurologic: no seizure no involuntary movements         Lab Data:   Basic Metabolic Panel: Recent Labs  Lab 08/13/18 0537 08/14/18 0556  NA 136  --   K 2.3* 3.5  CL 108  --   CO2 22  --   GLUCOSE 99  --   BUN 5*  --   CREATININE 0.80  --   CALCIUM 7.6*  --     ABG: No results for input(s): PHART, PCO2ART, PO2ART, HCO3, O2SAT in the last 168 hours.  Liver Function Tests: No results for input(s): AST, ALT, ALKPHOS, BILITOT, PROT, ALBUMIN in the last 168 hours. No results for input(s): LIPASE, AMYLASE in the last 168 hours. No results for input(s): AMMONIA in the last 168 hours.  CBC: Recent Labs  Lab 08/13/18 0537  WBC 7.1  HGB 7.7*  HCT 25.0*  MCV 86.2  PLT 455*    Cardiac Enzymes: No results for input(s): CKTOTAL, CKMB, CKMBINDEX, TROPONINI in the last 168 hours.  BNP (last 3 results) No results for input(s): BNP in the last 8760 hours.  ProBNP (last 3 results) No results  for input(s): PROBNP in the last 8760 hours.  Radiological Exams: No results found.  Assessment/Plan Active Problems:   Acute on chronic respiratory failure with hypoxia (HCC)   Spinal cord injury, cervical region [facility])   TBI (traumatic brain injury) (HCC)   Lobar pneumonia, unspecified organism (HCC)   MRSA bacteremia   1. Acute on chronic respiratory failure with hypoxia continue with weaning on pressure support for now, tracheostomy scheduled for Monday. 2. Spinal cord injury unchanged 3. Traumatic brain injury remains nonverbal 4. Lobar pneumonia treated follow-up x-ray as noted 5. MRSA bacteremia treated   I have personally seen and evaluated the patient, evaluated laboratory and imaging results, formulated the assessment and plan and placed orders. The Patient requires high complexity decision making for assessment and support.  Case was discussed on Rounds with the Respiratory Therapy Staff  Yevonne Pax, MD [facility] Pulmonary Critical Care Medicine Sleep Medicine

## 2018-08-16 LAB — BASIC METABOLIC PANEL
Anion gap: 6 (ref 5–15)
BUN: 7 mg/dL — ABNORMAL LOW (ref 8–23)
CO2: 24 mmol/L (ref 22–32)
Calcium: 7.6 mg/dL — ABNORMAL LOW (ref 8.9–10.3)
Chloride: 104 mmol/L (ref 98–111)
Creatinine, Ser: 0.74 mg/dL (ref 0.61–1.24)
GFR calc Af Amer: >60 mL/min (ref >60)
Glucose, Bld: 91 mg/dL (ref 70–99)
Potassium: 3.2 mmol/L — ABNORMAL LOW (ref 3.5–5.1)
Sodium: 134 mmol/L — ABNORMAL LOW (ref 135–145)

## 2018-08-16 LAB — CBC
HCT: 25.3 % — ABNORMAL LOW (ref 39.0–52.0)
Hemoglobin: 7.9 g/dL — ABNORMAL LOW (ref 13.0–17.0)
MCH: 27.1 pg (ref 26.0–34.0)
MCHC: 31.2 g/dL (ref 30.0–36.0)
MCV: 86.6 fL (ref 80.0–100.0)
Platelets: 580 10*3/uL — ABNORMAL HIGH (ref 150–400)
RBC: 2.92 MIL/uL — ABNORMAL LOW (ref 4.22–5.81)
RDW: 16.2 % — ABNORMAL HIGH (ref 11.5–15.5)
WBC: 7.3 10*3/uL (ref 4.0–10.5)
nRBC: 0 % (ref 0.0–0.2)

## 2018-08-16 LAB — VANCOMYCIN, TROUGH: Vancomycin Tr: 19 ug/mL (ref 15–20)

## 2018-08-16 NOTE — Progress Notes (Signed)
Pulmonary Critical Care Medicine [facility] [place]   PULMONARY CRITICAL CARE SERVICE  PROGRESS NOTE  Date of Service: 08/16/2018  Wayne Buckley  [id number]  DOB: Dec 21, 1950   DOA: 07/25/2018  Referring Physician: Carron Curie, MD  HPI: Wayne Buckley is a 68 y.o. male seen for follow up of Acute on Chronic Respiratory Failure.  He continues to do fine with pressure support at this point patient is supposed to have a tracheostomy done on Monday  Medications: Reviewed on Rounds  Physical Exam:  Vitals: Temperature 98.0 pulse 97 respiratory 23 blood pressure 117/74 saturations 95%  Ventilator Settings mode ventilation pressure support FiO2 28% tidal volume 350 pressure poor 12 PEEP 5  . General: Comfortable at this time . Eyes: Grossly normal lids, irises & conjunctiva . ENT: grossly tongue is normal . Neck: no obvious mass . Cardiovascular: S1 S2 normal no gallop . Respiratory: No rhonchi or rales are noted at this time . Abdomen: soft . Skin: no rash seen on limited exam . Musculoskeletal: not rigid . Psychiatric:unable to assess . Neurologic: no seizure no involuntary movements         Lab Data:   Basic Metabolic Panel: Recent Labs  Lab 08/13/18 0537 08/14/18 0556 08/16/18 0609  NA 136  --  134*  K 2.3* 3.5 3.2*  CL 108  --  104  CO2 22  --  24  GLUCOSE 99  --  91  BUN 5*  --  7*  CREATININE 0.80  --  0.74  CALCIUM 7.6*  --  7.6*    ABG: No results for input(s): PHART, PCO2ART, PO2ART, HCO3, O2SAT in the last 168 hours.  Liver Function Tests: No results for input(s): AST, ALT, ALKPHOS, BILITOT, PROT, ALBUMIN in the last 168 hours. No results for input(s): LIPASE, AMYLASE in the last 168 hours. No results for input(s): AMMONIA in the last 168 hours.  CBC: Recent Labs  Lab 08/13/18 0537 08/16/18 0609  WBC 7.1 7.3  HGB 7.7* 7.9*  HCT 25.0* 25.3*  MCV 86.2 86.6  PLT 455* 580*    Cardiac Enzymes: No results for input(s): CKTOTAL, CKMB,  CKMBINDEX, TROPONINI in the last 168 hours.  BNP (last 3 results) No results for input(s): BNP in the last 8760 hours.  ProBNP (last 3 results) No results for input(s): PROBNP in the last 8760 hours.  Radiological Exams: No results found.  Assessment/Plan Active Problems:   Acute on chronic respiratory failure with hypoxia (HCC)   Spinal cord injury, cervical region [facility])   TBI (traumatic brain injury) (HCC)   Lobar pneumonia, unspecified organism (HCC)   MRSA bacteremia   1. Acute on chronic respiratory failure with hypoxia we will continue with full support on the ventilator awaiting tracheostomy to be done on Monday 2. Spinal cord injury unchanged we will continue supportive care 3. Traumatic brain injury patient remains nonverbal 4. Lobar pneumonia improving 5. MRSA bacteremia treated resolved   I have personally seen and evaluated the patient, evaluated laboratory and imaging results, formulated the assessment and plan and placed orders. The Patient requires high complexity decision making for assessment and support.  Case was discussed on Rounds with the Respiratory Therapy Staff  Yevonne Pax, MD [facility] Pulmonary Critical Care Medicine Sleep Medicine

## 2018-08-17 NOTE — Progress Notes (Addendum)
Pulmonary Critical Care Medicine [facility] [place]   PULMONARY CRITICAL CARE SERVICE  PROGRESS NOTE  Date of Service: 08/17/2018  Wayne Buckley  DHW:[id number]  DOB: December 29, 1950   DOA: 07/25/2018  Referring Physician: Carron Curie, MD  HPI: Wayne Buckley is a 68 y.o. male seen for follow up of Acute on Chronic Respiratory Failure.  Patient continues to do well on pressure support 12/5 with an FiO2 28% during the day however does rest on the vent at night.  Continues to have ET tube is awake alert and following commands.  Plan is for tracheostomy on Monday.  Spoke with wife at bedside today who had some questions about the need for tracheostomy.  We reviewed the indications for tracheostomy and she is agreeable.  Medications: Reviewed on Rounds  Physical Exam:  Vitals: Pulse 92 respirations 19 BP 108/69 O2 sat 96% temp 99.8  Ventilator Settings patient is currently on pressure support 12/5 FiO2 28%.  At night he rested on ventilator mode AC VC rate of 16 tidal volume 450 PEEP of 528% FiO2  . General: Comfortable at this time . Eyes: Grossly normal lids, irises & conjunctiva . ENT: grossly tongue is normal . Neck: no obvious mass . Cardiovascular: S1 S2 normal no gallop . Respiratory: Coarse breath sounds . Abdomen: soft . Skin: no rash seen on limited exam . Musculoskeletal: not rigid . Psychiatric:unable to assess . Neurologic: no seizure no involuntary movements         Lab Data:   Basic Metabolic Panel: Recent Labs  Lab 08/13/18 0537 08/14/18 0556 08/16/18 0609  NA 136  --  134*  K 2.3* 3.5 3.2*  CL 108  --  104  CO2 22  --  24  GLUCOSE 99  --  91  BUN 5*  --  7*  CREATININE 0.80  --  0.74  CALCIUM 7.6*  --  7.6*    ABG: No results for input(s): PHART, PCO2ART, PO2ART, HCO3, O2SAT in the last 168 hours.  Liver Function Tests: No results for input(s): AST, ALT, ALKPHOS, BILITOT, PROT, ALBUMIN in the last 168 hours. No results for input(s): LIPASE,  AMYLASE in the last 168 hours. No results for input(s): AMMONIA in the last 168 hours.  CBC: Recent Labs  Lab 08/13/18 0537 08/16/18 0609  WBC 7.1 7.3  HGB 7.7* 7.9*  HCT 25.0* 25.3*  MCV 86.2 86.6  PLT 455* 580*    Cardiac Enzymes: No results for input(s): CKTOTAL, CKMB, CKMBINDEX, TROPONINI in the last 168 hours.  BNP (last 3 results) No results for input(s): BNP in the last 8760 hours.  ProBNP (last 3 results) No results for input(s): PROBNP in the last 8760 hours.  Radiological Exams: No results found.  Assessment/Plan Active Problems:   Acute on chronic respiratory failure with hypoxia (HCC)   Spinal cord injury, cervical region [facility])   TBI (traumatic brain injury) (HCC)   Lobar pneumonia, unspecified organism (HCC)   MRSA bacteremia   1. Acute on chronic respiratory failure with hypoxia continue full support on the ventilator awaiting tracheostomy on Monday.  Spoke to patient's wife to answer some final questions and she seems agreeable to trach placement on Monday. 2. Spinal cord injury unchanged continue supportive care 3. Traumatic brain injury patient remains nonverbal, however is following commands and giving thumbs up 4. Lobar pneumonia improving 5. MRSA bacteremia treated resolved   I have personally seen and evaluated the patient, evaluated laboratory and imaging results, formulated the assessment  and plan and placed orders. The Patient requires high complexity decision making for assessment and support.  Case was discussed on Rounds with the Respiratory Therapy Staff  Allyne Gee, MD [facility] Pulmonary Critical Care Medicine Sleep Medicine

## 2018-08-18 ENCOUNTER — Encounter (HOSPITAL_COMMUNITY): Payer: Self-pay | Admitting: Anesthesiology

## 2018-08-18 NOTE — Anesthesia Preprocedure Evaluation (Deleted)
Anesthesia Evaluation  Patient identified by MRN, date of birth, ID band Patient awake    Reviewed: Allergy & Precautions, NPO status , Patient's Chart, lab work & pertinent test results  Airway Mallampati: Intubated  TM Distance: >3 FB Neck ROM: Full    Dental  (+) Dental Advisory Given   Pulmonary pneumonia,    Pulmonary exam normal breath sounds clear to auscultation       Cardiovascular hypertension, Pt. on medications + DVT  Normal cardiovascular exam Rhythm:Regular Rate:Normal     Neuro/Psych PSYCHIATRIC DISORDERS TBI    GI/Hepatic negative GI ROS, Neg liver ROS,   Endo/Other  negative endocrine ROS  Renal/GU Renal disease     Musculoskeletal negative musculoskeletal ROS (+)   Abdominal   Peds  Hematology negative hematology ROS (+) anemia ,   Anesthesia Other Findings   Reproductive/Obstetrics negative OB ROS                             Anesthesia Physical  Anesthesia Plan  ASA: III  Anesthesia Plan: General   Post-op Pain Management:    Induction: Inhalational  PONV Risk Score and Plan: 2 and Treatment may vary due to age or medical condition  Airway Management Planned: Oral ETT  Additional Equipment: None  Intra-op Plan:   Post-operative Plan: Post-operative intubation/ventilation  Informed Consent: I have reviewed the patients History and Physical, chart, labs and discussed the procedure including the risks, benefits and alternatives for the proposed anesthesia with the patient or authorized representative who has indicated his/her understanding and acceptance.     Dental advisory given  Plan Discussed with: CRNA  Anesthesia Plan Comments:         Anesthesia Quick Evaluation

## 2018-08-18 NOTE — Progress Notes (Addendum)
Pulmonary Critical Care Medicine [facility] [place]   PULMONARY CRITICAL CARE SERVICE  PROGRESS NOTE  Date of Service: 08/18/2018  Wayne Buckley  PNT:[id number]  DOB: Nov 12, 1950   DOA: 07/25/2018  Referring Physician: Carron Curie, MD  HPI: Wayne Buckley is a 68 y.o. male seen for follow up of Acute on Chronic Respiratory Failure.  Patient was able to do 12 hours on pressure support yesterday.  He did a total of 4 hours today on pressure support before he was biting his lip and RT was concerned that he was tired.  Placed back on ventilator AC VC mode.  Patient continues to await trach tomorrow.  Medications: Reviewed on Rounds  Physical Exam:  Vitals: Pulse 87 respirations 20 BP 110/61 O2 sat 94% temp 96.4  Ventilator Settings ventilator mode AC VC rate of 16 tidal volume 450 PEEP of 5 FiO2 28%  . General: Comfortable at this time . Eyes: Grossly normal lids, irises & conjunctiva . ENT: grossly tongue is normal . Neck: no obvious mass . Cardiovascular: S1 S2 normal no gallop . Respiratory: Coarse breath sounds . Abdomen: soft . Skin: no rash seen on limited exam . Musculoskeletal: not rigid . Psychiatric:unable to assess . Neurologic: no seizure no involuntary movements         Lab Data:   Basic Metabolic Panel: Recent Labs  Lab 08/13/18 0537 08/14/18 0556 08/16/18 0609  NA 136  --  134*  K 2.3* 3.5 3.2*  CL 108  --  104  CO2 22  --  24  GLUCOSE 99  --  91  BUN 5*  --  7*  CREATININE 0.80  --  0.74  CALCIUM 7.6*  --  7.6*    ABG: No results for input(s): PHART, PCO2ART, PO2ART, HCO3, O2SAT in the last 168 hours.  Liver Function Tests: No results for input(s): AST, ALT, ALKPHOS, BILITOT, PROT, ALBUMIN in the last 168 hours. No results for input(s): LIPASE, AMYLASE in the last 168 hours. No results for input(s): AMMONIA in the last 168 hours.  CBC: Recent Labs  Lab 08/13/18 0537 08/16/18 0609  WBC 7.1 7.3  HGB 7.7* 7.9*  HCT 25.0* 25.3*  MCV  86.2 86.6  PLT 455* 580*    Cardiac Enzymes: No results for input(s): CKTOTAL, CKMB, CKMBINDEX, TROPONINI in the last 168 hours.  BNP (last 3 results) No results for input(s): BNP in the last 8760 hours.  ProBNP (last 3 results) No results for input(s): PROBNP in the last 8760 hours.  Radiological Exams: No results found.  Assessment/Plan Active Problems:   Acute on chronic respiratory failure with hypoxia (HCC)   Spinal cord injury, cervical region [facility])   TBI (traumatic brain injury) (HCC)   Lobar pneumonia, unspecified organism (HCC)   MRSA bacteremia   1. Acute on chronic respiratory failure with hypoxia continue full support on the ventilator awaiting tracheostomy tomorrow.  No acute distress is noted at this time. 2. Spinal cord injury unchanged continue supportive care 3. Traumatic brain injury patient remains nonverbal continues to follow commands. 4. Lobar pneumonia improving 5. MRSA bacteremia treated resolved   I have personally seen and evaluated the patient, evaluated laboratory and imaging results, formulated the assessment and plan and placed orders. The Patient requires high complexity decision making for assessment and support.  Case was discussed on Rounds with the Respiratory Therapy Staff  Yevonne Pax, MD [facility] Pulmonary Critical Care Medicine Sleep Medicine

## 2018-08-19 ENCOUNTER — Encounter
Admission: RE | Disposition: A | Discharge status: Still In Hospital | Payer: Self-pay | Source: Other Acute Inpatient Hospital | Attending: Internal Medicine

## 2018-08-19 ENCOUNTER — Encounter (HOSPITAL_COMMUNITY): Payer: Self-pay | Admitting: Certified Registered"

## 2018-08-19 ENCOUNTER — Inpatient Hospital Stay
Admission: RE | Admit: 2018-08-19 | Payer: BLUE CROSS/BLUE SHIELD | Source: Ambulatory Visit | Admitting: Internal Medicine

## 2018-08-19 ENCOUNTER — Encounter: Payer: Self-pay | Admitting: Certified Registered"

## 2018-08-19 HISTORY — PX: TRACHEOSTOMY TUBE PLACEMENT: SHX814

## 2018-08-19 SURGERY — CREATION, TRACHEOSTOMY
Anesthesia: General

## 2018-08-19 MED ORDER — ONDANSETRON HCL 4 MG/2ML IJ SOLN
INTRAMUSCULAR | Status: DC | PRN
  Administered 2018-08-19: 4 mg via INTRAVENOUS

## 2018-08-19 MED ORDER — MIDAZOLAM HCL 2 MG/2ML IJ SOLN
INTRAMUSCULAR | Status: DC | PRN
  Administered 2018-08-19: 2 mg via INTRAVENOUS

## 2018-08-19 MED ORDER — FENTANYL CITRATE (PF) 250 MCG/5ML IJ SOLN
INTRAMUSCULAR | Status: AC
  Filled 2018-08-19: qty 5

## 2018-08-19 MED ORDER — MIDAZOLAM HCL 2 MG/2ML IJ SOLN
INTRAMUSCULAR | Status: AC
  Filled 2018-08-19: qty 2

## 2018-08-19 MED ORDER — ONDANSETRON HCL 4 MG/2ML IJ SOLN
INTRAMUSCULAR | Status: AC
  Filled 2018-08-19: qty 2

## 2018-08-19 MED ORDER — EPHEDRINE 5 MG/ML INJ
INTRAVENOUS | Status: AC
  Filled 2018-08-19: qty 10

## 2018-08-19 MED ORDER — PHENYLEPHRINE 40 MCG/ML (10ML) SYRINGE FOR IV PUSH (FOR BLOOD PRESSURE SUPPORT)
PREFILLED_SYRINGE | INTRAVENOUS | Status: AC
  Filled 2018-08-19: qty 10

## 2018-08-19 MED ORDER — LIDOCAINE-EPINEPHRINE 1 %-1:100000 IJ SOLN
INTRAMUSCULAR | Status: AC
  Filled 2018-08-19: qty 1

## 2018-08-19 MED ORDER — EPHEDRINE SULFATE-NACL 50-0.9 MG/10ML-% IV SOSY
PREFILLED_SYRINGE | INTRAVENOUS | Status: DC | PRN
  Administered 2018-08-19: 15 mg via INTRAVENOUS
  Administered 2018-08-19: 10 mg via INTRAVENOUS

## 2018-08-19 MED ORDER — ROCURONIUM BROMIDE 50 MG/5ML IV SOSY
PREFILLED_SYRINGE | INTRAVENOUS | Status: DC | PRN
  Administered 2018-08-19: 50 mg via INTRAVENOUS

## 2018-08-19 MED ORDER — 0.9 % SODIUM CHLORIDE (POUR BTL) OPTIME
TOPICAL | Status: DC | PRN
  Administered 2018-08-19: 1000 mL

## 2018-08-19 MED ORDER — LIDOCAINE-EPINEPHRINE 1 %-1:100000 IJ SOLN
INTRAMUSCULAR | Status: DC | PRN
  Administered 2018-08-19: 5 mL

## 2018-08-19 MED ORDER — FENTANYL CITRATE (PF) 100 MCG/2ML IJ SOLN
INTRAMUSCULAR | Status: DC | PRN
  Administered 2018-08-19: 50 ug via INTRAVENOUS

## 2018-08-19 MED ORDER — LACTATED RINGERS IV SOLN
INTRAVENOUS | Status: DC | PRN
  Administered 2018-08-19: 09:00:00 via INTRAVENOUS

## 2018-08-19 MED ORDER — PHENYLEPHRINE 40 MCG/ML (10ML) SYRINGE FOR IV PUSH (FOR BLOOD PRESSURE SUPPORT)
PREFILLED_SYRINGE | INTRAVENOUS | Status: DC | PRN
  Administered 2018-08-19 (×3): 160 ug via INTRAVENOUS
  Administered 2018-08-19 (×2): 80 ug via INTRAVENOUS

## 2018-08-19 MED ORDER — PROPOFOL 10 MG/ML IV BOLUS
INTRAVENOUS | Status: AC
  Filled 2018-08-19: qty 20

## 2018-08-19 SURGICAL SUPPLY — 39 items
ATTRACTOMAT 16X20 MAGNETIC DRP (DRAPES) IMPLANT
BLADE SURG 15 STRL LF DISP TIS (BLADE) ×1 IMPLANT
BLADE SURG 15 STRL SS (BLADE) ×2
CLEANER TIP ELECTROSURG 2X2 (MISCELLANEOUS) ×3 IMPLANT
COVER SURGICAL LIGHT HANDLE (MISCELLANEOUS) ×3 IMPLANT
COVER WAND RF STERILE (DRAPES) ×3 IMPLANT
DRAPE HALF SHEET 40X57 (DRAPES) IMPLANT
ELECT COATED BLADE 2.86 ST (ELECTRODE) ×3 IMPLANT
ELECT REM PT RETURN 9FT ADLT (ELECTROSURGICAL) ×3
ELECTRODE REM PT RTRN 9FT ADLT (ELECTROSURGICAL) ×1 IMPLANT
GAUZE 4X4 16PLY RFD (DISPOSABLE) ×3 IMPLANT
GEL ULTRASOUND 20GR AQUASONIC (MISCELLANEOUS) ×3 IMPLANT
GLOVE SS BIOGEL STRL SZ 7.5 (GLOVE) ×1 IMPLANT
GLOVE SUPERSENSE BIOGEL SZ 7.5 (GLOVE) ×2
GOWN STRL REUS W/ TWL LRG LVL3 (GOWN DISPOSABLE) ×1 IMPLANT
GOWN STRL REUS W/ TWL XL LVL3 (GOWN DISPOSABLE) ×1 IMPLANT
GOWN STRL REUS W/TWL LRG LVL3 (GOWN DISPOSABLE) ×2
GOWN STRL REUS W/TWL XL LVL3 (GOWN DISPOSABLE) ×2
HOLDER TRACH TUBE VELCRO 19.5 (MISCELLANEOUS) ×3 IMPLANT
KIT BASIN OR (CUSTOM PROCEDURE TRAY) ×3 IMPLANT
KIT SUCTION CATH 14FR (SUCTIONS) ×3 IMPLANT
KIT TURNOVER KIT B (KITS) ×3 IMPLANT
NEEDLE HYPO 25GX1X1/2 BEV (NEEDLE) ×3 IMPLANT
NS IRRIG 1000ML POUR BTL (IV SOLUTION) ×3 IMPLANT
PACK EENT II TURBAN DRAPE (CUSTOM PROCEDURE TRAY) ×3 IMPLANT
PAD ARMBOARD 7.5X6 YLW CONV (MISCELLANEOUS) IMPLANT
PENCIL BUTTON HOLSTER BLD 10FT (ELECTRODE) ×3 IMPLANT
SPONGE DRAIN TRACH 4X4 STRL 2S (GAUZE/BANDAGES/DRESSINGS) ×3 IMPLANT
SPONGE INTESTINAL PEANUT (DISPOSABLE) ×3 IMPLANT
SUT SILK 2 0 SH CR/8 (SUTURE) ×3 IMPLANT
SUT SILK 3 0 TIES 10X30 (SUTURE) IMPLANT
SYR 5ML LUER SLIP (SYRINGE) ×3 IMPLANT
SYR CONTROL 10ML LL (SYRINGE) ×3 IMPLANT
TOWEL OR 17X24 6PK STRL BLUE (TOWEL DISPOSABLE) ×3 IMPLANT
TOWEL OR 17X26 10 PK STRL BLUE (TOWEL DISPOSABLE) ×3 IMPLANT
TUBE CONNECTING 12'X1/4 (SUCTIONS) ×1
TUBE CONNECTING 12X1/4 (SUCTIONS) ×2 IMPLANT
TUBE TRACH SHILEY  6 DIST  CUF (TUBING) ×3 IMPLANT
TUBE TRACH SHILEY 8 DIST CUF (TUBING) IMPLANT

## 2018-08-19 NOTE — Transfer of Care (Signed)
Immediate Anesthesia Transfer of Care Note  Patient: Wayne Buckley  Procedure(s) Performed: TRACHEOSTOMY (N/A )  Patient Location: Nursing Unit  Anesthesia Type:General  Level of Consciousness: Patient remains intubated per anesthesia plan  Airway & Oxygen Therapy: Patient remains intubated per anesthesia plan and Patient placed on Ventilator (see vital sign flow sheet for setting)  Post-op Assessment: Report given to RN  Post vital signs: Reviewed and stable  Last Vitals:  Vitals Value Taken Time  BP    Temp    Pulse    Resp    SpO2      Last Pain: There were no vitals filed for this visit.       Complications: No apparent anesthesia complications

## 2018-08-19 NOTE — H&P (Signed)
PREOPERATIVE H&P  Chief Complaint: Acute on chronic respiratory failure  HPI: Wayne Buckley is a 68 y.o. male who presents for evaluation of acute respiratory failure for tracheostomy.  Patient is status post acute spinal cord injury 5 months ago with traumatic brain injury.  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  He is on a prolonged course of IV vancomycin.  He has poor upper airway protection from secretions requiring intubation 10 days ago.  A tracheotomy was recommended and patient is agreeable.  Past Medical History:  Diagnosis Date  . Acute on chronic respiratory failure with hypoxia (HCC)   . BPH (benign prostatic hyperplasia)   . DVT (deep venous thrombosis) (HCC) 02/2018   extensive LLE DVT  . H/O alcohol abuse   . Hypertension   . Lobar pneumonia, unspecified organism (HCC)   . MRSA bacteremia   . Neurogenic bowel   . Spinal cord injury, cervical region (HCC) 02/2018  . TBI (traumatic brain injury) (HCC) 2013   SDH with left partial frontal lobectomy   Past Surgical History:  Procedure Laterality Date  . ANTERIOR CERVICAL DECOMP/DISCECTOMY FUSION N/A 02/27/2018   Procedure: ANTERIOR CERVICAL DECOMPRESSION/DISCECTOMY FUSION C6-7;  Surgeon: Donalee Citrin, MD;  Location: [facility] OR;  Service: Neurosurgery;  Laterality: N/A;  . CRANIOTOMY  04/28/2012   Procedure: CRANIOTOMY HEMATOMA EVACUATION SUBDURAL;  Surgeon: Reinaldo Meeker, MD;  Location: [facility] OR;  Service: Neurosurgery;  Laterality: Left;  Left Craniotomy for Subdural Hematoma  . IR FLUORO GUIDE CV LINE RIGHT  05/14/2018  . IR FLUORO GUIDE CV LINE RIGHT  07/19/2018  . IR US GUIDE VASC ACCESS RIGHT  05/14/2018  . IR US GUIDE VASC ACCESS RIGHT  07/19/2018  . RADIOLOGY WITH ANESTHESIA N/A 02/27/2018   Procedure: MRI WITH ANESTHESIA;  Surgeon: Radiologist, Medication, MD;  Location: [facility] OR;  Service: Radiology;  Laterality: N/A;  . RADIOLOGY WITH ANESTHESIA N/A  07/08/2018   Procedure: MRI WITH ANESTHESIA/CERVICAL THORASTIC/LUMBAR SPINE WITH AND WITHOUT CONTRAST;  Surgeon: Radiologist, Medication, MD;  Location: [facility] OR;  Service: Radiology;  Laterality: N/A;  . VENA CAVA FILTER PLACEMENT Right 02/27/2018   Procedure: INSERTION VENA-CAVA FILTER;  Surgeon: Nada Libman, MD;  Location: [facility] OR;  Service: Vascular;  Laterality: Right;   Social History   Socioeconomic History  . Marital status: Married    Spouse name: Not on file  . Number of children: Not on file  . Years of education: Not on file  . Highest education level: Not on file  Occupational History  . Occupation: retired Counsellor  . Financial resource strain: Not on file  . Food insecurity:    Worry: Not on file    Inability: Not on file  . Transportation needs:    Medical: Not on file    Non-medical: Not on file  Tobacco Use  . Smoking status: Never Smoker  . Smokeless tobacco: Never Used  Substance and Sexual Activity  . Alcohol use: Yes    Comment: daily   . Drug use: Not on file  . Sexual activity: Not on file  Lifestyle  . Physical activity:    Days per week: Not on file    Minutes per session: Not on file  . Stress: Not on file  Relationships  . Social connections:    Talks on phone: Not on file    Gets together: Not on file    Attends religious service: Not  on file    Active member of club or organization: Not on file    Attends meetings of clubs or organizations: Not on file    Relationship status: Not on file  Other Topics Concern  . Not on file  Social History Narrative  . Not on file   Family History  Problem Relation Age of Onset  . Heart disease Father    No Known Allergies Prior to Admission medications   Medication Sig Start Date End Date Taking? Authorizing Provider  acetaminophen (TYLENOL) 325 MG tablet Take 2 tablets (650 mg total) by mouth every 6 (six) hours as needed for mild pain. 04/03/18   Angiulli, Mcarthur Rossetti, PA-C  apixaban  (ELIQUIS) 5 MG TABS tablet Take 1 tablet (5 mg total) by mouth 2 (two) times daily. 04/03/18   Angiulli, Mcarthur Rossetti, PA-C  bisacodyl (DULCOLAX) 10 MG suppository Place 1 suppository (10 mg total) rectally daily at 6 (six) AM. Patient taking differently: Place 10 mg rectally daily as needed for mild constipation.  04/03/18   Angiulli, Mcarthur Rossetti, PA-C  carvedilol (COREG) 6.25 MG tablet Take 1 tablet (6.25 mg total) by mouth 2 (two) times daily with a meal. 07/25/18   Rolly Salter, MD  collagenase (SANTYL) ointment Apply topically daily. Cleanse gluteal wounds with NS.  Apply Santyl to open areas.  Cover with NS moist 2x2 and foam dressings.  Peel back foam and change daily. Foam is changed every three days and PRN soilage. Patient not taking: Reported on 07/05/2018 05/15/18   Rai, Delene Ruffini, MD  ferrous sulfate 325 (65 FE) MG tablet Take 1 tablet (325 mg total) by mouth 2 (two) times daily with a meal. 05/31/18   Calvert Cantor, MD  guaiFENesin (MUCINEX) 600 MG 12 hr tablet Take 1 tablet (600 mg total) by mouth 2 (two) times daily. 07/25/18   Rolly Salter, MD  HYDROcodone-acetaminophen (NORCO/VICODIN) 5-325 MG tablet Take 1 tablet by mouth every 6 (six) hours as needed for moderate pain. 07/25/18   Rolly Salter, MD  levalbuterol Pauline Aus) 0.63 MG/3ML nebulizer solution Take 3 mLs (0.63 mg total) by nebulization every 8 (eight) hours. 07/25/18   Rolly Salter, MD  liver oil-zinc oxide (DESITIN) 40 % ointment Apply topically 4 (four) times daily. Apply to sacrum, coccyx, buttocks. Patient taking differently: Apply 1 application topically 4 (four) times daily. Apply to sacrum, coccyx, buttocks. 05/14/18   Rai, Delene Ruffini, MD  polyethylene glycol (MIRALAX / GLYCOLAX) packet Take 17 g by mouth 2 (two) times daily. Patient not taking: Reported on 07/05/2018 05/14/18   Rai, Delene Ruffini, MD  polyethylene glycol (MIRALAX / GLYCOLAX) packet Take 17 g by mouth daily as needed for mild constipation or moderate  constipation. 05/31/18   Calvert Cantor, MD  senna (SENOKOT) 8.6 MG TABS tablet Take 1 tablet (8.6 mg total) by mouth at bedtime. 07/25/18   Rolly Salter, MD  sodium phosphate (FLEET) 7-19 GM/118ML ENEM Place 133 mLs (1 enema total) rectally daily as needed for severe constipation. 05/31/18   Calvert Cantor, MD  tamsulosin (FLOMAX) 0.4 MG CAPS capsule Take 1 capsule (0.4 mg total) by mouth daily. 03/06/18   Leroy Sea, MD  vancomycin (VANCOCIN) 1-5 GM/200ML-% SOLN Inject 200 mLs (1,000 mg total) into the vein daily. 07/26/18   Rolly Salter, MD  vitamin C (VITAMIN C) 1000 MG tablet Take 1 tablet (1,000 mg total) by mouth 2 (two) times daily. 04/03/18   Charlton Amor, PA-C  Positive ROS: negative  All other systems have been reviewed and were otherwise negative with the exception of those mentioned in the HPI and as above.  Physical Exam: There were no vitals filed for this visit.  General: Alert and intubated on vent.  Discussed tracheostomy with patient and wife. Neck: No palpable adenopathy or thyroid nodules.  Trachea midline without masses. Cardiovascular: Regular rate and rhythm, no murmur.  Respiratory: Clear to auscultation Neurologic: Alert    Assessment/Plan: RESPIRATORY FAILURE Plan for Procedure(s): TRACHEOSTOMY   Dillard Cannon, MD 08/19/2018 9:05 AM

## 2018-08-19 NOTE — Brief Op Note (Signed)
08/19/2018  10:08 AM  PATIENT:  Wayne Buckley  68 y.o. male  PRE-OPERATIVE DIAGNOSIS:  Airway, respiratory failure  POST-OPERATIVE DIAGNOSIS:  Airway, respiratory failure  PROCEDURE:  Procedure(s): TRACHEOSTOMY (N/A) #6 Shiley  SURGEON:  Surgeon(s) and Role:    * Drema Halon, MD - Primary  PHYSICIAN ASSISTANT:   ASSISTANTS: none   ANESTHESIA:   general  EBL:  minimal   BLOOD ADMINISTERED:none  DRAINS: none   LOCAL MEDICATIONS USED:  XYLOCAINE   SPECIMEN:  No Specimen  DISPOSITION OF SPECIMEN:  N/A  COUNTS:  YES  TOURNIQUET:  * No tourniquets in log *  DICTATION: .Other Dictation: Dictation Number [id number]  PLAN OF CARE: Discharge to home after PACU  PATIENT DISPOSITION:  PACU - hemodynamically stable.   Delay start of Pharmacological VTE agent (>24hrs) due to surgical blood loss or risk of bleeding: yes

## 2018-08-19 NOTE — Progress Notes (Addendum)
Pulmonary Critical Care Medicine [facility] [place]   PULMONARY CRITICAL CARE SERVICE  PROGRESS NOTE  Date of Service: 08/19/2018  SAI LICURSI  PNT:[id number]  DOB: [birth year]   DOA: 07/25/2018  Referring Physician: Carron Curie, MD  HPI: Wayne Buckley is a 68 y.o. male seen for follow up of Acute on Chronic Respiratory Failure.  Patient was trached today doing well with a #6 Shiley in place.  Bleeding is minimal at this time.  He remains on the ventilator with a rate of 16 FiO2 28% at this time.  Medications: Reviewed on Rounds  Physical Exam:  Vitals: Pulse 95 respirations 22 BP 93/60 O2 sat 97% temp 97.5  Ventilator Settings ventilator mode AC VC rate of 16 tidal volume 440 PEEP of 528% FiO2  . General: Comfortable at this time . Eyes: Grossly normal lids, irises & conjunctiva . ENT: grossly tongue is normal . Neck: no obvious mass . Cardiovascular: S1 S2 normal no gallop . Respiratory: Coarse breath sounds . Abdomen: soft . Skin: no rash seen on limited exam . Musculoskeletal: not rigid . Psychiatric:unable to assess . Neurologic: no seizure no involuntary movements         Lab Data:   Basic Metabolic Panel: Recent Labs  Lab 08/13/18 0537 08/14/18 0556 08/16/18 0609  NA 136  --  134*  K 2.3* 3.5 3.2*  CL 108  --  104  CO2 22  --  24  GLUCOSE 99  --  91  BUN 5*  --  7*  CREATININE 0.80  --  0.74  CALCIUM 7.6*  --  7.6*    ABG: No results for input(s): PHART, PCO2ART, PO2ART, HCO3, O2SAT in the last 168 hours.  Liver Function Tests: No results for input(s): AST, ALT, ALKPHOS, BILITOT, PROT, ALBUMIN in the last 168 hours. No results for input(s): LIPASE, AMYLASE in the last 168 hours. No results for input(s): AMMONIA in the last 168 hours.  CBC: Recent Labs  Lab 08/13/18 0537 08/16/18 0609  WBC 7.1 7.3  HGB 7.7* 7.9*  HCT 25.0* 25.3*  MCV 86.2 86.6  PLT 455* 580*    Cardiac Enzymes: No results for input(s): CKTOTAL, CKMB, CKMBINDEX,  TROPONINI in the last 168 hours.  BNP (last 3 results) No results for input(s): BNP in the last 8760 hours.  ProBNP (last 3 results) No results for input(s): PROBNP in the last 8760 hours.  Radiological Exams: No results found.  Assessment/Plan Active Problems:   Acute on chronic respiratory failure with hypoxia (HCC)   Spinal cord injury, cervical region [facility])   TBI (traumatic brain injury) (HCC)   Lobar pneumonia, unspecified organism (HCC)   MRSA bacteremia   1. Acute on chronic respiratory failure with hypoxia continue full support on the ventilator awaiting received tracheostomy today.  Patient doing well no acute distress noted at this time.  Minimal bleeding. 2. Spinal cord injury unchanged continue supportive care 3. Traumatic brain injury patient remains nonverbal continues to follow commands 4. Lobar pneumonia improving 5. MRSA bacteremia treated resolved   I have personally seen and evaluated the patient, evaluated laboratory and imaging results, formulated the assessment and plan and placed orders. The Patient requires high complexity decision making for assessment and support.  Case was discussed on Rounds with the Respiratory Therapy Staff  Yevonne Pax, MD [facility] Pulmonary Critical Care Medicine Sleep Medicine

## 2018-08-19 NOTE — Anesthesia Preprocedure Evaluation (Signed)
Anesthesia Evaluation  Patient identified by MRN, date of birth, ID band Patient awake    Reviewed: Allergy & Precautions, NPO status , Patient's Chart, lab work & pertinent test results  Airway Mallampati: Intubated  TM Distance: >3 FB Neck ROM: Full    Dental  (+) Dental Advisory Given   Pulmonary pneumonia,    Pulmonary exam normal breath sounds clear to auscultation       Cardiovascular hypertension, Pt. on medications + DVT  Normal cardiovascular exam Rhythm:Regular Rate:Normal     Neuro/Psych PSYCHIATRIC DISORDERS TBI    GI/Hepatic negative GI ROS, Neg liver ROS,   Endo/Other  negative endocrine ROS  Renal/GU Renal disease     Musculoskeletal negative musculoskeletal ROS (+)   Abdominal   Peds  Hematology negative hematology ROS (+) anemia ,   Anesthesia Other Findings   Reproductive/Obstetrics negative OB ROS                             Anesthesia Physical  Anesthesia Plan  ASA: III  Anesthesia Plan: General   Post-op Pain Management:    Induction: Inhalational  PONV Risk Score and Plan: 2 and Treatment may vary due to age or medical condition  Airway Management Planned: Oral ETT  Additional Equipment: None  Intra-op Plan:   Post-operative Plan: Post-operative intubation/ventilation  Informed Consent: I have reviewed the patients History and Physical, chart, labs and discussed the procedure including the risks, benefits and alternatives for the proposed anesthesia with the patient or authorized representative who has indicated his/her understanding and acceptance.     Dental advisory given  Plan Discussed with: CRNA  Anesthesia Plan Comments:         Anesthesia Quick Evaluation  

## 2018-08-19 NOTE — Anesthesia Postprocedure Evaluation (Signed)
Anesthesia Post Note  Patient: Wayne Buckley  Procedure(s) Performed: TRACHEOSTOMY (N/A )     Patient location during evaluation: ICU Anesthesia Type: General Level of consciousness: sedated Pain management: pain level controlled Vital Signs Assessment: post-procedure vital signs reviewed and stable Respiratory status: patient on ventilator - see flowsheet for VS Cardiovascular status: stable Anesthetic complications: no    Last Vitals: There were no vitals filed for this visit.  Last Pain: There were no vitals filed for this visit.               Lewie Loron

## 2018-08-19 NOTE — Op Note (Signed)
NAME: IVIN, KONECNY MEDICAL RECORD TY:[id number] ACCOUNT [id number] DATE OF BIRTH:[birth year] FACILITY: [facility] LOCATION: [facility] PHYSICIAN:CHRISTOPHER Braxton Feathers, MD  OPERATIVE REPORT  DATE OF PROCEDURE:  08/19/2018  PREOPERATIVE DIAGNOSIS:  Acute on chronic respiratory failure.  POSTOPERATIVE DIAGNOSIS:  Acute on chronic respiratory failure.  OPERATION PERFORMED:  Tracheostomy with a #6 Shiley.  SURGEON:  Dillard Cannon, MD  ANESTHESIA:  General endotracheal.  COMPLICATIONS:  None.  ESTIMATED BLOOD LOSS:  Minimal.  BRIEF CLINICAL NOTE:  The patient is a 68 year old gentleman who has had previous history of closed head injury as well as recently a fall and a cervical spine injury requiring surgery.  He has subsequently developed aspiration pneumonia and bursa  osteomyelitis of the spine.  He is receiving long-term IV vancomycin.  He has continued to have respiratory problems with difficulty swallowing and handling secretions.  He was intubated a little over 10 days ago and a tracheostomy was recommended.  He  is taken to the operating room this time for tracheostomy.  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  The area was injected with Xylocaine with epinephrine.  A vertical incision  was made just at the inferior margin of the cricoid cartilage.  Dissection was carried down through subcutaneous tissue.  The cricoid cartilage was identified.  A hook was used to elevate the cricoid and the first 2 tracheal rings were exposed.  This was  just above the thyroid isthmus and was cauterized superiorly.  A horizontal tracheotomy was made between the first and second tracheal rings.  Inferior flap was created with scissors and a #6 Shiley tube was inserted without any difficulty after removal  of the endotracheal tube.  The patient was ventilated well.  This  was secured to the neck with 2-0 silk sutures x4 and Velcro trach collar around the neck.   The patient was subsequently transferred back to [facility]  AN/NUANCE  D:08/19/2018 T:08/19/2018 JOB:[id number]

## 2018-08-20 ENCOUNTER — Encounter (HOSPITAL_COMMUNITY): Payer: Self-pay | Admitting: Otolaryngology

## 2018-08-20 NOTE — Progress Notes (Signed)
Pulmonary Critical Care Medicine [facility] [place]   PULMONARY CRITICAL CARE SERVICE  PROGRESS NOTE  Date of Service: 08/20/2018  MAURISIO VORHIES  EXH:[id number]  DOB: [birth year]   DOA: 07/25/2018  Referring Physician: Carron Curie, MD  HPI: Wayne Buckley is a 68 y.o. male seen for follow up of Acute on Chronic Respiratory Failure.  Patient right now is on pressure support the goal is for 16-hour wean  Medications: Reviewed on Rounds  Physical Exam:  Vitals: Temperature 98.4 pulse 96 respiratory 20 blood pressure 98/56 saturations 96%  Ventilator Settings on pressure support FiO2 28% tidal volume 500 pressure support 12 PEEP 5  . General: Comfortable at this time . Eyes: Grossly normal lids, irises & conjunctiva . ENT: grossly tongue is normal . Neck: no obvious mass . Cardiovascular: S1 S2 normal no gallop . Respiratory: No rhonchi or rales are noted . Abdomen: soft . Skin: no rash seen on limited exam . Musculoskeletal: not rigid . Psychiatric:unable to assess . Neurologic: no seizure no involuntary movements         Lab Data:   Basic Metabolic Panel: Recent Labs  Lab 08/14/18 0556 08/16/18 0609  NA  --  134*  K 3.5 3.2*  CL  --  104  CO2  --  24  GLUCOSE  --  91  BUN  --  7*  CREATININE  --  0.74  CALCIUM  --  7.6*    ABG: No results for input(s): PHART, PCO2ART, PO2ART, HCO3, O2SAT in the last 168 hours.  Liver Function Tests: No results for input(s): AST, ALT, ALKPHOS, BILITOT, PROT, ALBUMIN in the last 168 hours. No results for input(s): LIPASE, AMYLASE in the last 168 hours. No results for input(s): AMMONIA in the last 168 hours.  CBC: Recent Labs  Lab 08/16/18 0609  WBC 7.3  HGB 7.9*  HCT 25.3*  MCV 86.6  PLT 580*    Cardiac Enzymes: No results for input(s): CKTOTAL, CKMB, CKMBINDEX, TROPONINI in the last 168 hours.  BNP (last 3 results) No results for input(s): BNP in the last 8760 hours.  ProBNP (last 3 results) No results  for input(s): PROBNP in the last 8760 hours.  Radiological Exams: No results found.  Assessment/Plan Active Problems:   Acute on chronic respiratory failure with hypoxia (HCC)   Spinal cord injury, cervical region [facility])   TBI (traumatic brain injury) (HCC)   Lobar pneumonia, unspecified organism (HCC)   MRSA bacteremia   1. Acute on chronic respiratory failure with hypoxia we will continue with pressure support mode titrate oxygen continue pulmonary toilet 2. Spinal cord injury grossly unchanged 3. TBI no changes noted 4. Lobar pneumonia treated improving 5. MRSA bacteremia treated   I have personally seen and evaluated the patient, evaluated laboratory and imaging results, formulated the assessment and plan and placed orders. The Patient requires high complexity decision making for assessment and support.  Case was discussed on Rounds with the Respiratory Therapy Staff  Yevonne Pax, MD [facility] Pulmonary Critical Care Medicine Sleep Medicine

## 2018-08-21 NOTE — Progress Notes (Signed)
Pulmonary Critical Care Medicine [facility] [place]   PULMONARY CRITICAL CARE SERVICE  PROGRESS NOTE  Date of Service: 08/21/2018  Wayne Buckley  YOM:[id number]  DOB: 12-22-1950   DOA: 07/25/2018  Referring Physician: Carron Curie, MD  HPI: Wayne Buckley is a 68 y.o. male seen for follow up of Acute on Chronic Respiratory Failure.  He is weaning on T collar today the goal is to try for 4 hours  Medications: Reviewed on Rounds  Physical Exam:  Vitals: Temperature 98.8 pulse 82 respiratory 19 blood pressure 140/54 saturations 99%  Ventilator Settings off the ventilator on T collar 35% FiO2  . General: Comfortable at this time . Eyes: Grossly normal lids, irises & conjunctiva . ENT: grossly tongue is normal . Neck: no obvious mass . Cardiovascular: S1 S2 normal no gallop . Respiratory: No rhonchi or rales are noted at this time . Abdomen: soft . Skin: no rash seen on limited exam . Musculoskeletal: not rigid . Psychiatric:unable to assess . Neurologic: no seizure no involuntary movements         Lab Data:   Basic Metabolic Panel: Recent Labs  Lab 08/16/18 0609  NA 134*  K 3.2*  CL 104  CO2 24  GLUCOSE 91  BUN 7*  CREATININE 0.74  CALCIUM 7.6*    ABG: No results for input(s): PHART, PCO2ART, PO2ART, HCO3, O2SAT in the last 168 hours.  Liver Function Tests: No results for input(s): AST, ALT, ALKPHOS, BILITOT, PROT, ALBUMIN in the last 168 hours. No results for input(s): LIPASE, AMYLASE in the last 168 hours. No results for input(s): AMMONIA in the last 168 hours.  CBC: Recent Labs  Lab 08/16/18 0609  WBC 7.3  HGB 7.9*  HCT 25.3*  MCV 86.6  PLT 580*    Cardiac Enzymes: No results for input(s): CKTOTAL, CKMB, CKMBINDEX, TROPONINI in the last 168 hours.  BNP (last 3 results) No results for input(s): BNP in the last 8760 hours.  ProBNP (last 3 results) No results for input(s): PROBNP in the last 8760 hours.  Radiological Exams: No results  found.  Assessment/Plan Active Problems:   Acute on chronic respiratory failure with hypoxia (HCC)   Spinal cord injury, cervical region [facility])   TBI (traumatic brain injury) (HCC)   Lobar pneumonia, unspecified organism (HCC)   MRSA bacteremia   1. Acute on chronic respiratory failure hypoxia we will continue with T collar trials continue secretion management pulmonary toilet 2. Spinal cord injury unchanged we will continue with supportive care trial 3. Traumatic brain injury stable 4. Lobar pneumonia treated resolved 5. MRSA bacteremia treated we will continue with supportive care   I have personally seen and evaluated the patient, evaluated laboratory and imaging results, formulated the assessment and plan and placed orders. The Patient requires high complexity decision making for assessment and support.  Case was discussed on Rounds with the Respiratory Therapy Staff  Yevonne Pax, MD [facility] Pulmonary Critical Care Medicine Sleep Medicine

## 2018-08-22 LAB — VANCOMYCIN, TROUGH: Vancomycin Tr: 22 ug/mL (ref 15–20)

## 2018-08-22 NOTE — Progress Notes (Addendum)
Pulmonary Critical Care Medicine [facility] [place]   PULMONARY CRITICAL CARE SERVICE  PROGRESS NOTE  Date of Service: 08/22/2018  Wayne Buckley  KGY:[id number]  DOB: September 23, 1950   DOA: 07/25/2018  Referring Physician: Carron Curie, MD  HPI: Wayne Buckley is a 68 y.o. male seen for follow up of Acute on Chronic Respiratory Failure.  Patient continues to do well on T collar for 8 hours today 28% FiO2.  Medications: Reviewed on Rounds  Physical Exam:  Vitals: Pulse 98 respirations 15 BP 105/68 O2 sat 100% temp 98.8  Ventilator Settings T collar 28% FiO2.  . General: Comfortable at this time . Eyes: Grossly normal lids, irises & conjunctiva . ENT: grossly tongue is normal . Neck: no obvious mass . Cardiovascular: S1 S2 normal no gallop . Respiratory: No rhonchi rales noted . Abdomen: soft . Skin: no rash seen on limited exam . Musculoskeletal: not rigid . Psychiatric:unable to assess . Neurologic: no seizure no involuntary movements         Lab Data:   Basic Metabolic Panel: Recent Labs  Lab 08/16/18 0609  NA 134*  K 3.2*  CL 104  CO2 24  GLUCOSE 91  BUN 7*  CREATININE 0.74  CALCIUM 7.6*    ABG: No results for input(s): PHART, PCO2ART, PO2ART, HCO3, O2SAT in the last 168 hours.  Liver Function Tests: No results for input(s): AST, ALT, ALKPHOS, BILITOT, PROT, ALBUMIN in the last 168 hours. No results for input(s): LIPASE, AMYLASE in the last 168 hours. No results for input(s): AMMONIA in the last 168 hours.  CBC: Recent Labs  Lab 08/16/18 0609  WBC 7.3  HGB 7.9*  HCT 25.3*  MCV 86.6  PLT 580*    Cardiac Enzymes: No results for input(s): CKTOTAL, CKMB, CKMBINDEX, TROPONINI in the last 168 hours.  BNP (last 3 results) No results for input(s): BNP in the last 8760 hours.  ProBNP (last 3 results) No results for input(s): PROBNP in the last 8760 hours.  Radiological Exams: No results found.  Assessment/Plan Active Problems:   Acute on  chronic respiratory failure with hypoxia (HCC)   Spinal cord injury, cervical region [facility])   TBI (traumatic brain injury) (HCC)   Lobar pneumonia, unspecified organism (HCC)   MRSA bacteremia   1. Acute on chronic respiratory failure with hypoxia continue with T collar trials as well as secretion management pulmonary toilet. 2. Spinal cord injury unchanged continue supportive care 3. Traumatic brain injury stable 4. Lobar pneumonia treated 5. MRSA bacteremia treated continue supportive care   I have personally seen and evaluated the patient, evaluated laboratory and imaging results, formulated the assessment and plan and placed orders. The Patient requires high complexity decision making for assessment and support.  Case was discussed on Rounds with the Respiratory Therapy Staff  Yevonne Pax, MD [facility] Pulmonary Critical Care Medicine Sleep Medicine

## 2018-08-23 LAB — BASIC METABOLIC PANEL
Anion gap: 5 (ref 5–15)
BUN: 13 mg/dL (ref 8–23)
CO2: 24 mmol/L (ref 22–32)
Calcium: 7.8 mg/dL — ABNORMAL LOW (ref 8.9–10.3)
Chloride: 105 mmol/L (ref 98–111)
Creatinine, Ser: 0.83 mg/dL (ref 0.61–1.24)
GFR calc Af Amer: >60 mL/min (ref >60)
GFR calc non Af Amer: >60 mL/min (ref >60)
Glucose, Bld: 106 mg/dL — ABNORMAL HIGH (ref 70–99)
Potassium: 3 mmol/L — ABNORMAL LOW (ref 3.5–5.1)
Sodium: 134 mmol/L — ABNORMAL LOW (ref 135–145)

## 2018-08-23 LAB — CBC
HCT: 22.9 % — ABNORMAL LOW (ref 39.0–52.0)
Hemoglobin: 7 g/dL — ABNORMAL LOW (ref 13.0–17.0)
MCH: 26.1 pg (ref 26.0–34.0)
MCHC: 30.6 g/dL (ref 30.0–36.0)
MCV: 85.4 fL (ref 80.0–100.0)
Platelets: 681 10*3/uL — ABNORMAL HIGH (ref 150–400)
RBC: 2.68 MIL/uL — ABNORMAL LOW (ref 4.22–5.81)
RDW: 15.3 % (ref 11.5–15.5)
WBC: 8.2 10*3/uL (ref 4.0–10.5)
nRBC: 0 % (ref 0.0–0.2)

## 2018-08-23 LAB — PROTIME-INR
INR: 1.9 — ABNORMAL HIGH (ref 0.8–1.2)
PROTHROMBIN TIME: 21.6 s — AB (ref 11.4–15.2)

## 2018-08-23 NOTE — Progress Notes (Signed)
Pulmonary Critical Care Medicine [facility] [place]   PULMONARY CRITICAL CARE SERVICE  PROGRESS NOTE  Date of Service: 08/23/2018  Wayne Buckley  RAX:[id number]  DOB: [birth year]   DOA: 07/25/2018  Referring Physician: Carron Curie, MD  HPI: Wayne Buckley is a 68 y.o. male seen for follow up of Acute on Chronic Respiratory Failure.  Patient is on T collar the goal today is for 12 hours  Medications: Reviewed on Rounds  Physical Exam:  Vitals: Temperature 98.9 pulse 98 respiratory rate 20 blood pressure 91/65 saturations 100%  Ventilator Settings on T collar FiO2 28%  . General: Comfortable at this time . Eyes: Grossly normal lids, irises & conjunctiva . ENT: grossly tongue is normal . Neck: no obvious mass . Cardiovascular: S1 S2 normal no gallop . Respiratory: No rhonchi or rales are noted . Abdomen: soft . Skin: no rash seen on limited exam . Musculoskeletal: not rigid . Psychiatric:unable to assess . Neurologic: no seizure no involuntary movements         Lab Data:   Basic Metabolic Panel: No results for input(s): NA, K, CL, CO2, GLUCOSE, BUN, CREATININE, CALCIUM, MG, PHOS in the last 168 hours.  ABG: No results for input(s): PHART, PCO2ART, PO2ART, HCO3, O2SAT in the last 168 hours.  Liver Function Tests: No results for input(s): AST, ALT, ALKPHOS, BILITOT, PROT, ALBUMIN in the last 168 hours. No results for input(s): LIPASE, AMYLASE in the last 168 hours. No results for input(s): AMMONIA in the last 168 hours.  CBC: No results for input(s): WBC, NEUTROABS, HGB, HCT, MCV, PLT in the last 168 hours.  Cardiac Enzymes: No results for input(s): CKTOTAL, CKMB, CKMBINDEX, TROPONINI in the last 168 hours.  BNP (last 3 results) No results for input(s): BNP in the last 8760 hours.  ProBNP (last 3 results) No results for input(s): PROBNP in the last 8760 hours.  Radiological Exams: No results found.  Assessment/Plan Active Problems:   Acute on chronic  respiratory failure with hypoxia (HCC)   Spinal cord injury, cervical region [facility])   TBI (traumatic brain injury) (HCC)   Lobar pneumonia, unspecified organism (HCC)   MRSA bacteremia   1. Acute on chronic respiratory failure with hypoxia we will continue with T collar trials continue secretion management pulmonary toilet 2. Spinal cord injury cervical region continue with supportive care 3. Traumatic brain injury unchanged 4. Lobar pneumonia treated clinically improved 5. MRSA bacteremia resolved   I have personally seen and evaluated the patient, evaluated laboratory and imaging results, formulated the assessment and plan and placed orders. The Patient requires high complexity decision making for assessment and support.  Case was discussed on Rounds with the Respiratory Therapy Staff  Yevonne Pax, MD [facility] Pulmonary Critical Care Medicine Sleep Medicine

## 2018-08-23 NOTE — Consult Note (Signed)
Chief Complaint: Patient was seen in consultation today for percutaneous gastric tube placement at the request of Dr Ardeth SportsmanA Hijazi   Supervising Physician: Ruel FavorsShick, Trevor  Patient Status: Select IP  History of Present Illness: Wayne Buckley is a 68 y.o. male   Hx CVA (on Eliquis); metabolic encephalopathy Traumatic brain injury- lobotomy Osteomyelitis- spine On vanco now 6-7 weeks  Malnutrition; dysphagia Need for long term care Debilitation  Request for percutaneous gastric tube placement  Imaging reviewed with Radiologist Approves procedure (will need po barium night before to opacify bowel)    Past Medical History:  Diagnosis Date  . Acute on chronic respiratory failure with hypoxia (HCC)   . BPH (benign prostatic hyperplasia)   . DVT (deep venous thrombosis) (HCC) 02/2018   extensive LLE DVT  . H/O alcohol abuse   . Hypertension   . Lobar pneumonia, unspecified organism (HCC)   . MRSA bacteremia   . Neurogenic bowel   . Spinal cord injury, cervical region (HCC) 02/2018  . TBI (traumatic brain injury) (HCC) 2013   SDH with left partial frontal lobectomy    Past Surgical History:  Procedure Laterality Date  . ANTERIOR CERVICAL DECOMP/DISCECTOMY FUSION N/A 02/27/2018   Procedure: ANTERIOR CERVICAL DECOMPRESSION/DISCECTOMY FUSION C6-7;  Surgeon: Donalee Citrinram, Gary, MD;  [facility];  Service: Neurosurgery;  Laterality: N/A;  . CRANIOTOMY  04/28/2012   Procedure: CRANIOTOMY HEMATOMA EVACUATION SUBDURAL;  Surgeon: Reinaldo Meekerandy O Kritzer, MD;  Location: [facility] OR;  Service: Neurosurgery;  Laterality: Left;  Left Craniotomy for Subdural Hematoma  . IR FLUORO GUIDE CV LINE RIGHT  05/14/2018  . IR FLUORO GUIDE CV LINE RIGHT  07/19/2018  . IR US GUIDE VASC ACCESS RIGHT  05/14/2018  . IR US GUIDE VASC ACCESS RIGHT  07/19/2018  . RADIOLOGY WITH ANESTHESIA N/A 02/27/2018   Procedure: MRI WITH ANESTHESIA;  Surgeon: Radiologist, Medication, MD;  Location: [facility] OR;  Service: Radiology;   Laterality: N/A;  . RADIOLOGY WITH ANESTHESIA N/A 07/08/2018   Procedure: MRI WITH ANESTHESIA/CERVICAL THORASTIC/LUMBAR SPINE WITH AND WITHOUT CONTRAST;  Surgeon: Radiologist, Medication, MD;  Location: [facility] OR;  Service: Radiology;  Laterality: N/A;  . TRACHEOSTOMY TUBE PLACEMENT N/A 08/19/2018   Procedure: TRACHEOSTOMY;  Surgeon: Drema HalonNewman, Christopher E, MD;  [facility];  Service: ENT;  Laterality: N/A;  . VENA CAVA FILTER PLACEMENT Right 02/27/2018   Procedure: INSERTION VENA-CAVA FILTER;  Surgeon: Nada LibmanBrabham, Vance W, MD;  Location: [facility] OR;  Service: Vascular;  Laterality: Right;    Allergies: Patient has no known allergies.  Medications: Prior to Admission medications   Medication Sig Start Date End Date Taking? Authorizing Provider  acetaminophen (TYLENOL) 325 MG tablet Take 2 tablets (650 mg total) by mouth every 6 (six) hours as needed for mild pain. 04/03/18   Angiulli, Mcarthur Rossettianiel J, PA-C  apixaban (ELIQUIS) 5 MG TABS tablet Take 1 tablet (5 mg total) by mouth 2 (two) times daily. 04/03/18   Angiulli, Mcarthur Rossettianiel J, PA-C  bisacodyl (DULCOLAX) 10 MG suppository Place 1 suppository (10 mg total) rectally daily at 6 (six) AM. Patient taking differently: Place 10 mg rectally daily as needed for mild constipation.  04/03/18   Angiulli, Mcarthur Rossettianiel J, PA-C  carvedilol (COREG) 6.25 MG tablet Take 1 tablet (6.25 mg total) by mouth 2 (two) times daily with a meal. 07/25/18   Rolly SalterPatel, Pranav M, MD  collagenase (SANTYL) ointment Apply topically daily. Cleanse gluteal wounds with NS.  Apply Santyl to open areas.  Cover with NS moist 2x2 and foam dressings.  Peel back foam and change daily. Foam is changed every three days and PRN soilage. Patient not taking: Reported on 07/05/2018 05/15/18   Rai, Delene Ruffini, MD  ferrous sulfate 325 (65 FE) MG tablet Take 1 tablet (325 mg total) by mouth 2 (two) times daily with a meal. 05/31/18   Calvert Cantor, MD  guaiFENesin (MUCINEX) 600 MG 12 hr tablet Take 1 tablet (600 mg total) by  mouth 2 (two) times daily. 07/25/18   Rolly Salter, MD  HYDROcodone-acetaminophen (NORCO/VICODIN) 5-325 MG tablet Take 1 tablet by mouth every 6 (six) hours as needed for moderate pain. 07/25/18   Rolly Salter, MD  levalbuterol Pauline Aus) 0.63 MG/3ML nebulizer solution Take 3 mLs (0.63 mg total) by nebulization every 8 (eight) hours. 07/25/18   Rolly Salter, MD  liver oil-zinc oxide (DESITIN) 40 % ointment Apply topically 4 (four) times daily. Apply to sacrum, coccyx, buttocks. Patient taking differently: Apply 1 application topically 4 (four) times daily. Apply to sacrum, coccyx, buttocks. 05/14/18   Rai, Delene Ruffini, MD  polyethylene glycol (MIRALAX / GLYCOLAX) packet Take 17 g by mouth 2 (two) times daily. Patient not taking: Reported on 07/05/2018 05/14/18   Rai, Delene Ruffini, MD  polyethylene glycol (MIRALAX / GLYCOLAX) packet Take 17 g by mouth daily as needed for mild constipation or moderate constipation. 05/31/18   Calvert Cantor, MD  senna (SENOKOT) 8.6 MG TABS tablet Take 1 tablet (8.6 mg total) by mouth at bedtime. 07/25/18   Rolly Salter, MD  sodium phosphate (FLEET) 7-19 GM/118ML ENEM Place 133 mLs (1 enema total) rectally daily as needed for severe constipation. 05/31/18   Calvert Cantor, MD  tamsulosin (FLOMAX) 0.4 MG CAPS capsule Take 1 capsule (0.4 mg total) by mouth daily. 03/06/18   Leroy Sea, MD  vancomycin (VANCOCIN) 1-5 GM/200ML-% SOLN Inject 200 mLs (1,000 mg total) into the vein daily. 07/26/18   Rolly Salter, MD  vitamin C (VITAMIN C) 1000 MG tablet Take 1 tablet (1,000 mg total) by mouth 2 (two) times daily. 04/03/18   Angiulli, Mcarthur Rossetti, PA-C     Family History  Problem Relation Age of Onset  . Heart disease Father     Social History   Socioeconomic History  . Marital status: Married    Spouse name: Not on file  . Number of children: Not on file  . Years of education: Not on file  . Highest education level: Not on file  Occupational History  . Occupation:  retired Counsellor  . Financial resource strain: Not on file  . Food insecurity:    Worry: Not on file    Inability: Not on file  . Transportation needs:    Medical: Not on file    Non-medical: Not on file  Tobacco Use  . Smoking status: Never Smoker  . Smokeless tobacco: Never Used  Substance and Sexual Activity  . Alcohol use: Yes    Comment: daily   . Drug use: Not on file  . Sexual activity: Not on file  Lifestyle  . Physical activity:    Days per week: Not on file    Minutes per session: Not on file  . Stress: Not on file  Relationships  . Social connections:    Talks on phone: Not on file    Gets together: Not on file    Attends religious service: Not on file    Active member of club or organization: Not on file  Attends meetings of clubs or organizations: Not on file    Relationship status: Not on file  Other Topics Concern  . Not on file  Social History Narrative  . Not on file    Review of Systems: A 12 point ROS discussed and pertinent positives are indicated in the HPI above.  All other systems are negative.   Vital Signs: There were no vitals taken for this visit.  Physical Exam Vitals signs reviewed.  Constitutional:      Appearance: He is ill-appearing.     Comments: Afebrile  Cardiovascular:     Rate and Rhythm: Normal rate and regular rhythm.  Pulmonary:     Breath sounds: Wheezing present.     Comments: trach Abdominal:     General: There is no distension.  Musculoskeletal:     Comments: Difficult to follow commands  Skin:    General: Skin is warm and dry.  Neurological:     Mental Status: Mental status is at baseline.  Psychiatric:     Comments: [name] Consents for procedure     Imaging: Dg Abd 1 View  Result Date: 07/31/2018 CLINICAL DATA:  Feeding tube placement under fluoroscopy EXAM: ABDOMEN - 1 VIEW COMPARISON:  CT abdomen and pelvis 07/08/2018 FLUOROSCOPY TIME:  2 minutes 12 seconds Dose: 7.0 mGy  Images obtained: 1 FINDINGS: Feeding tube traverses stomach and duodenal sweep with tip beyond ligament of Treitz in the LEFT upper quadrant. Small amount of administered contrast material opacifies a jejunal loop. Pigtail drainage catheter at the inferior LEFT hemithorax. Bones demineralized. IVC filter noted. Bowel gas pattern normal. IMPRESSION: Tip of feeding tube placed beyond ligament of Treitz into proximal jejunum. Electronically Signed   By: Ulyses Southward M.D.   On: 07/31/2018 17:00   Ct Chest Wo Contrast  Result Date: 07/25/2018 CLINICAL DATA:  Status post percutaneous drainage of a left empyema. Follow-up exam. EXAM: CT CHEST WITHOUT CONTRAST TECHNIQUE: Multidetector CT imaging of the chest was performed following the standard protocol without IV contrast. COMPARISON:  Chest radiographs, most recent dated 07/24/2018. Chest CT, 07/16/2018. FINDINGS: Cardiovascular: Heart is normal in size. No pericardial effusion. 2 vessel coronary artery calcifications. Great vessels are normal in caliber. Minor aortic atherosclerotic calcifications. Mediastinum/Nodes: No neck base or axillary masses or enlarged lymph nodes. There are calcified subcarinal and left hilar lymph nodes that are stable from the prior CT. No mediastinal or hilar masses. No pathologically enlarged lymph nodes. Some secretions are noted in the trachea. Trachea otherwise unremarkable. Esophagus is unremarkable. Lungs/Pleura: Small left and minimal right pleural effusions. Pigtail catheter curls in the posteromedial, inferior left hemithorax. A small amount of fluid tracks along the left oblique fissure. There is dependent opacity in both lower lobes, greater on the left, consistent with atelectasis, infection or a combination. Linear atelectasis is also noted in the left upper lobe associated with calcifications. Lungs otherwise clear. When compared to the prior CT, left lung consolidation has significantly improved. No evidence of pulmonary  edema.  No pneumothorax. Upper Abdomen: No acute abnormality. Musculoskeletal: There is irregular endplate resorption, lower endplate of T11, upper endplate of T12, lower endplate of T12 and upper endplate of L1, with abnormal surrounding soft tissue attenuation, which appears to have progressed when compared to a thoracic MRI dated 07/08/2018. IMPRESSION: 1. Significant improvement in the appearance of the left lung, with a decrease in pleural fluid/empyema, following left chest tube placement, when compared to the prior chest CT dated 07/16/2018. 2. No new lung  abnormalities. Small left and minimal right pleural effusions. Residual opacity noted in the left lower lobe that may reflect infection or atelectasis or a combination. Mild right posterior lower lobe atelectasis. 3. 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 Aortic Atherosclerosis (ICD10-I70.0). Electronically Signed   By: Amie Portland M.D.   On: 07/25/2018 10:46   Ct Angio Chest Pe W Or Wo Contrast  Result Date: 08/06/2018 CLINICAL DATA:  Acute hypoxic respiratory failure EXAM: CT ANGIOGRAPHY CHEST WITH CONTRAST TECHNIQUE: Multidetector CT imaging of the chest was performed using the standard protocol during bolus administration of intravenous contrast. Multiplanar CT image reconstructions and MIPs were obtained to evaluate the vascular anatomy. CONTRAST:  ISOVUE-370 IOPAMIDOL (ISOVUE-370) INJECTION 76% COMPARISON:  07/25/2018 FINDINGS: Cardiovascular: Satisfactory opacification of the pulmonary arteries to the segmental level. No evidence of pulmonary embolism. Normal heart size. No pericardial effusion. Mediastinum/Nodes: Subcarinal and left mediastinal/hilar calcified lymph nodes usually from remote granulomatous disease. Lungs/Pleura: Extensive endobronchial debris in the right middle and lower lobes which are collapsed. There is also atelectasis in  the left lower lobe. Small pleural effusions, larger on the left where there is a pleural catheter. Calcified granuloma in the left lung. Upper Abdomen: IVC filter and feeding tube. 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 There is extensive paravertebral phlegmon. Canal collection seen on most recent MRI 07/08/2018. No clear progression by CT. Review of the MIP images confirms the above findings. IMPRESSION: 1. Negative for pulmonary embolism. 2. Right middle and lower lobe collapse with extensive endobronchial debris, consider aspiration. 3. Left pleural catheter with small pleural effusion that is dependent and unchanged. 4. Known multilevel lower thoracic discitis osteomyelitis Electronically Signed   By: Marnee Spring M.D.   On: 08/06/2018 04:27   Dg Chest Port 1 View  Result Date: 08/13/2018 CLINICAL DATA:  ETT,HX PNEUMONIA,LOCULATED EFFUSION,CHEST TUBE EXAM: PORTABLE CHEST 1 VIEW COMPARISON:  Chest x-rays dated 08/09/2018 and 08/08/2018. FINDINGS: Endotracheal tube is well positioned with tip just above the level of the carina. Enteric tube passes below the diaphragm. RIGHT-sided central line is stable in position with tip at the level of the cavoatrial junction. LEFT-sided chest tube is stable in position with tip directed towards the medial portion of the LEFT lung base. Stable bibasilar opacities, likely atelectasis and/or small pleural effusions. No new lung findings. No pneumothorax seen. IMPRESSION: Stable chest x-ray. Support apparatus appears stable in position. Electronically Signed   By: Bary Richard M.D.   On: 08/13/2018 09:01   Dg Chest Port 1 View  Result Date: 08/09/2018 CLINICAL DATA:  Pneumonia. EXAM: PORTABLE CHEST 1 VIEW COMPARISON:  Chest x-ray from yesterday. FINDINGS: Endotracheal tube 3.7 cm above the carina. Unchanged feeding tube with tip below the field of view. Unchanged right internal jugular central venous catheter  with tip in the right atrium. Unchanged left pigtail chest tube. Stable cardiomediastinal silhouette. Worsening right basilar airspace disease with new right-sided pleural effusion. Improved left pleural effusion and left lung airspace disease with residual airspace disease and layering pleural fluid at the left lung base. No pneumothorax. No acute osseous abnormality. IMPRESSION: 1. Worsening right basilar atelectasis/infiltrate with new right-sided pleural effusion. 2. Improved left pleural effusion and left lung airspace disease with residual left lower lobe atelectasis/infiltrate. Electronically Signed   By: Obie Dredge M.D.   On: 08/09/2018 12:48   Dg Chest Port 1 View  Result Date: 08/08/2018 CLINICAL DATA:  Intubated. EXAM: PORTABLE CHEST 1 VIEW COMPARISON:  Earlier today. Chest CTA dated 08/06/2018. FINDINGS: Endotracheal tube tip 4.6 cm above the carina. Right jugular catheter tip in inferior aspect of the superior vena cava. Feeding tube extending into the stomach. Significant increase in pleural and parenchymal opacity on the left. Interval minimal ill-defined right lower lung zone opacity. The left lower chest pigtail catheter is unchanged with the exception of lack of previously suspected kinking. The cardiac silhouette remains borderline enlarged. Multiple calcified lymph nodes are again demonstrated. Thoracic spine degenerative changes and cervical spine fixation hardware. IMPRESSION: 1. Significant increase in pleural fluid and atelectasis/pneumonia on the left. 2. Interval minimal patchy right basilar atelectasis or pneumonia. Electronically Signed   By: Beckie Salts M.D.   On: 08/08/2018 09:48   Dg Chest Port 1 View  Result Date: 08/08/2018 CLINICAL DATA:  Respiratory failure. EXAM: PORTABLE CHEST 1 VIEW COMPARISON:  08/05/2018. FINDINGS: Significant worsening aeration. Decreased lung volumes, and increased LEFT basilar opacity likely representing combination of atelectasis and  worsening effusion. Pigtail catheter unchanged position but kinked in its midportion. RIGHT basilar subsegmental atelectasis. Increasing fluid at the LEFT lung apex. Feeding tube traverses the esophagus. Cardiomediastinal silhouette is increased. The central venous catheter tip lies within the RIGHT atrium. IMPRESSION: Worsening aeration, particularly LEFT lower lobe process is increased. LEFT pigtail catheter appears kinked. Correlate clinically for fluid output. Electronically Signed   By: Elsie Stain M.D.   On: 08/08/2018 09:07   Dg Chest Port 1 View  Result Date: 08/05/2018 CLINICAL DATA:  68 year old male with respiratory failure. Sepsis, loculated left pleural effusion. CT-guided left chest tube placement last month. Thoracic discitis osteomyelitis. EXAM: PORTABLE CHEST 1 VIEW COMPARISON:  Portable chest 08/04/2018 and earlier. Chest CT 07/25/2018 FINDINGS: Portable AP semi upright view at 2134 hours. Enteric feeding tube, right IJ central line and pigtail left chest tube are in place. No endotracheal tube. Improved left lung ventilation since yesterday. No pneumothorax. Mild residual confluent left lung base opacity. Increased streaky opacity at the right lung base. No pulmonary edema. Stable cardiac size and mediastinal contours. Calcified mediastinal lymph nodes again noted. Prior cervical ACDF. IMPRESSION: 1. Stable lines and tubes. 2. Increased streaky opacity at the right lung base, which could be atelectasis or infection. 3. Improved left lung ventilation since yesterday.  No pneumothorax. Electronically Signed   By: Odessa Fleming M.D.   On: 08/05/2018 21:51   Dg Chest Port 1 View  Result Date: 08/04/2018 CLINICAL DATA:  Chest tube in place EXAM: PORTABLE CHEST 1 VIEW COMPARISON:  July 30, 2018 FINDINGS: Drainage catheter is present in the medial left base region, stable. No evident pneumothorax. Loculated fluid is noted in the left base with consolidation medially. Areas of atelectatic change  in the left mid and lower lung zones are also present. There is overall volume loss on the left. There is new patchy infiltrate in the right base. Central catheter tip is in the right atrium slightly distal to the cavoatrial junction. Feeding tube tip is below the diaphragm. There is postoperative change in the lower cervical region. No evident adenopathy. There is aortic atherosclerosis. IMPRESSION: Tube and catheter positions are unchanged. No pneumothorax. There is new right base airspace consolidation, felt to represent pneumonia or possibly focus of aspiration. Fluid in the left base with medial left base consolidation remains. There is patchy atelectasis on the left as well. Stable cardiac silhouette. Aortic Atherosclerosis (ICD10-I70.0). Electronically Signed   By: Bretta Bang III M.D.  On: 08/04/2018 07:49   Dg Chest Port 1 View  Result Date: 07/30/2018 CLINICAL DATA:  Respiratory distress. EXAM: PORTABLE CHEST 1 VIEW COMPARISON:  Radiograph of July 26, 2018. FINDINGS: Stable cardiomegaly. No pneumothorax is noted. Right internal jugular catheter is noted with distal tip in expected position of cavoatrial junction. Stable position of left-sided pleural drainage catheter is noted. Stable left basilar atelectasis is noted with probable small left pleural effusion. Bony thorax is unremarkable. IMPRESSION: Stable position of left-sided pleural drainage catheter. Stable left basilar atelectasis and small effusion is noted. Electronically Signed   By: Lupita Raider, M.D.   On: 07/30/2018 12:01   Dg Chest Port 1 View  Result Date: 07/26/2018 CLINICAL DATA:  Pneumonia. EXAM: PORTABLE CHEST 1 VIEW COMPARISON:  CT chest dated July 25, 2018. Chest x-ray dated July 24, 2018. FINDINGS: Unchanged left-sided pigtail chest tube. Unchanged right central venous catheter with tip at the cavoatrial junction. The heart size and mediastinal contours are within normal limits. Normal pulmonary vascularity.  Atherosclerotic calcification of the aortic arch. Stable small residual loculated left pleural effusion and band like atelectasis in the left lung. The right lung is clear. No pneumothorax. No acute osseous abnormality. IMPRESSION: 1. Stable small residual loculated left pleural effusion and band like atelectasis in the left lung. Electronically Signed   By: Obie Dredge M.D.   On: 07/26/2018 08:24    Labs:  CBC: Recent Labs    08/05/18 2311 08/08/18 0457 08/13/18 0537 08/16/18 0609  WBC 7.8 7.2 7.1 7.3  HGB 7.8* 8.1* 7.7* 7.9*  HCT 25.5* 27.5* 25.0* 25.3*  PLT 497* 538* 455* 580*    COAGS: Recent Labs    05/10/18 0642 05/11/18 0615 05/12/18 0628 05/13/18 0503 07/05/18 1918 07/11/18 1113 07/18/18 1048 07/26/18 0633  INR  --   --   --   --  1.89 1.34 1.34 1.53  APTT 54* 53* 51* 54*  --   --   --   --     BMP: Recent Labs    08/05/18 2311 08/08/18 0457 08/13/18 0537 08/14/18 0556 08/16/18 0609  NA 133* 138 136  --  134*  K 3.1* 3.9 2.3* 3.5 3.2*  CL 100 106 108  --  104  CO2 25 19* 22  --  24  GLUCOSE 94 83 99  --  91  BUN 18 9 5*  --  7*  CALCIUM 8.2* 8.3* 7.6*  --  7.6*  CREATININE 0.83 0.94 0.80  --  0.74  GFRNONAA >60 >60 >60  --  >60  GFRAA >60 >60 >60  --  >60    LIVER FUNCTION TESTS: Recent Labs    07/07/18 0835 07/09/18 1725 07/26/18 0633 08/05/18 2311  BILITOT 0.4 0.5 0.7 0.5  AST ALT ALKPHOS 97 96 102 133*  PROT 5.5* 5.8* 5.6* 5.8*  ALBUMIN 1.4* 1.5* 1.4* 1.5*    TUMOR MARKERS: No results for input(s): AFPTM, CEA, CA199, CHROMGRNA in the last 8760 hours.  Assessment and Plan:  Hx CVA TBI; encephalopathy MRSA- spine (07/08/18) On vancomycin Dysphagia; malnutrition Debilitation Need for long term care Scheduled for percutaneous gastric tube placement in IR next week Checking labs Off Eliquis at least 2 days Afebrile Risks and benefits discussed with the patient's wife including, but not limited to the  need for a barium enema during the procedure, bleeding, infection, peritonitis, or damage to adjacent structures.  All of herquestions were answered,  she is agreeable to proceed. Consent signed and in chart.  Thank you for this interesting consult.  I greatly enjoyed meeting CHALLEN SPAINHOUR and look forward to participating in their care.  A copy of this report was sent to the requesting provider on this date.  Electronically Signed: Robet Leu, PA-C 08/23/2018, 10:36 AM   I spent a total of 40 Minutes    in face to face in clinical consultation, greater than 50% of which was counseling/coordinating care for perc gastric tube placement

## 2018-08-24 LAB — C DIFFICILE QUICK SCREEN W PCR REFLEX
C Diff antigen: NEGATIVE
C Diff interpretation: NOT DETECTED
C Diff toxin: NEGATIVE

## 2018-08-24 LAB — POTASSIUM: Potassium: 3.7 mmol/L (ref 3.5–5.1)

## 2018-08-24 NOTE — Progress Notes (Addendum)
Pulmonary Critical Care Medicine [facility] [place]   PULMONARY CRITICAL CARE SERVICE  PROGRESS NOTE  Date of Service: 08/24/2018  Wayne Buckley  MVH:[id number]  DOB: [birth year]   DOA: 07/25/2018  Referring Physician: Carron Curie, MD  HPI: Wayne Buckley is a 68 y.o. male seen for follow up of Acute on Chronic Respiratory Failure.  Patient had a goal today of 16 hours on T collar FiO2 28%.  Patient was able to accomplish this goal was placed back on ventilator on assist control pressure control mode.  Medications: Reviewed on Rounds  Physical Exam:  Vitals: Pulse 89 respirations 21 BP 23/80 O2 sat 95% temp 98.8  Ventilator Settings ventilator mode AC VC rate 16 tidal volume 440 PEEP 5 FiO2 28%  . General: Comfortable at this time . Eyes: Grossly normal lids, irises & conjunctiva . ENT: grossly tongue is normal . Neck: no obvious mass . Cardiovascular: S1 S2 normal no gallop . Respiratory: No rales rhonchi noted . Abdomen: soft . Skin: no rash seen on limited exam . Musculoskeletal: not rigid . Psychiatric:unable to assess . Neurologic: no seizure no involuntary movements         Lab Data:   Basic Metabolic Panel: Recent Labs  Lab 08/23/18 1119 08/24/18 0526  NA 134*  --   K 3.0* 3.7  CL 105  --   CO2 24  --   GLUCOSE 106*  --   BUN 13  --   CREATININE 0.83  --   CALCIUM 7.8*  --     ABG: No results for input(s): PHART, PCO2ART, PO2ART, HCO3, O2SAT in the last 168 hours.  Liver Function Tests: No results for input(s): AST, ALT, ALKPHOS, BILITOT, PROT, ALBUMIN in the last 168 hours. No results for input(s): LIPASE, AMYLASE in the last 168 hours. No results for input(s): AMMONIA in the last 168 hours.  CBC: Recent Labs  Lab 08/23/18 1119  WBC 8.2  HGB 7.0*  HCT 22.9*  MCV 85.4  PLT 681*    Cardiac Enzymes: No results for input(s): CKTOTAL, CKMB, CKMBINDEX, TROPONINI in the last 168 hours.  BNP (last 3 results) No results for input(s): BNP  in the last 8760 hours.  ProBNP (last 3 results) No results for input(s): PROBNP in the last 8760 hours.  Radiological Exams: No results found.  Assessment/Plan Active Problems:   Acute on chronic respiratory failure with hypoxia (HCC)   Spinal cord injury, cervical region [facility])   TBI (traumatic brain injury) (HCC)   Lobar pneumonia, unspecified organism (HCC)   MRSA bacteremia   1. Acute on chronic respiratory with hypoxia continue with T collar trials as per protocol.  Patient is doing well at this time and made 16 hours today.  Continue aggressive pulmonary toilet and secretion management 2. Spinal cord injury cervical region continue support care 3. Traumatic brain injury unchanged 4. Lobar pneumonia treated clinically improved 5. MRSA bacteremia resolved   I have personally seen and evaluated the patient, evaluated laboratory and imaging results, formulated the assessment and plan and placed orders. The Patient requires high complexity decision making for assessment and support.  Case was discussed on Rounds with the Respiratory Therapy Staff  Yevonne Pax, MD [facility] Pulmonary Critical Care Medicine Sleep Medicine

## 2018-08-25 LAB — POTASSIUM: Potassium: 4.1 mmol/L (ref 3.5–5.1)

## 2018-08-25 NOTE — Progress Notes (Addendum)
Pulmonary Critical Care Medicine [facility] [place]   PULMONARY CRITICAL CARE SERVICE  PROGRESS NOTE  Date of Service: 08/25/2018  Wayne Buckley  YOK:[id number]  DOB: 1951-06-02   DOA: 07/25/2018  Referring Physician: Carron Curie, MD  HPI: Wayne Buckley is a 68 y.o. male seen for follow up of Acute on Chronic Respiratory Failure.  Patient was able to tolerate 16 hours on T collar 20% FiO2 yesterday.  The goal today is 24 hours.  Patient is doing well at this time.  Medications: Reviewed on Rounds  Physical Exam:  Vitals: Pulse 98 respirations 23 BP 142/76 O2 sat 90% temp 97.9  Ventilator Settings patient is currently on T collar 20% FiO2 however when resting on the ventilator mode is AC VC rate of 16 tidal volume 450 PEEP of 5 FiO2 28%  . General: Comfortable at this time . Eyes: Grossly normal lids, irises & conjunctiva . ENT: grossly tongue is normal . Neck: no obvious mass . Cardiovascular: S1 S2 normal no gallop . Respiratory: No rales or rhonchi noted . Abdomen: soft . Skin: no rash seen on limited exam . Musculoskeletal: not rigid . Psychiatric:unable to assess . Neurologic: no seizure no involuntary movements         Lab Data:   Basic Metabolic Panel: Recent Labs  Lab 08/23/18 1119 08/24/18 0526 08/25/18 0832  NA 134*  --   --   K 3.0* 3.7 4.1  CL 105  --   --   CO2 24  --   --   GLUCOSE 106*  --   --   BUN 13  --   --   CREATININE 0.83  --   --   CALCIUM 7.8*  --   --     ABG: No results for input(s): PHART, PCO2ART, PO2ART, HCO3, O2SAT in the last 168 hours.  Liver Function Tests: No results for input(s): AST, ALT, ALKPHOS, BILITOT, PROT, ALBUMIN in the last 168 hours. No results for input(s): LIPASE, AMYLASE in the last 168 hours. No results for input(s): AMMONIA in the last 168 hours.  CBC: Recent Labs  Lab 08/23/18 1119  WBC 8.2  HGB 7.0*  HCT 22.9*  MCV 85.4  PLT 681*    Cardiac Enzymes: No results for input(s): CKTOTAL,  CKMB, CKMBINDEX, TROPONINI in the last 168 hours.  BNP (last 3 results) No results for input(s): BNP in the last 8760 hours.  ProBNP (last 3 results) No results for input(s): PROBNP in the last 8760 hours.  Radiological Exams: No results found.  Assessment/Plan Active Problems:   Acute on chronic respiratory failure with hypoxia (HCC)   Spinal cord injury, cervical region [facility])   TBI (traumatic brain injury) (HCC)   Lobar pneumonia, unspecified organism (HCC)   MRSA bacteremia   1. Acute on chronic respiratory failure with hypoxia continue with T collar trials per protocol.  Goal is 24 hours today on T collar.  Continue aggressive pulmonary toilet and secretion management. 2. Spinal cord injury cervical region continue supportive care 3. Traumatic brain injury unchanged 4. Lobar pneumonia treated clinically improved 5. MRSA bacteremia resolved   I have personally seen and evaluated the patient, evaluated laboratory and imaging results, formulated the assessment and plan and placed orders. The Patient requires high complexity decision making for assessment and support.  Case was discussed on Rounds with the Respiratory Therapy Staff  Yevonne Pax, MD [facility] Pulmonary Critical Care Medicine Sleep Medicine

## 2018-08-26 LAB — VANCOMYCIN, TROUGH: Vancomycin Tr: 20 ug/mL (ref 15–20)

## 2018-08-26 NOTE — Progress Notes (Signed)
Pulmonary Critical Care Medicine [facility] [place]   PULMONARY CRITICAL CARE SERVICE  PROGRESS NOTE  Date of Service: 08/26/2018  Wayne Buckley  YTK:[id number]  DOB: June 23, 1950   DOA: 07/25/2018  Referring Physician: Carron Curie, MD  HPI: Wayne Buckley is a 68 y.o. male seen for follow up of Acute on Chronic Respiratory Failure.  Patient right now is on T collar has been on 28% oxygen the goal is for 24 hours  Medications: Reviewed on Rounds  Physical Exam:  Vitals: Temperature 99.1 pulse 115 respiratory rate 30 blood pressure 154/84 saturations 95%  Ventilator Settings on T collar FiO2 is 28%  . General: Comfortable at this time . Eyes: Grossly normal lids, irises & conjunctiva . ENT: grossly tongue is normal . Neck: no obvious mass . Cardiovascular: S1 S2 normal no gallop . Respiratory: No rhonchi or rales are noted . Abdomen: soft . Skin: no rash seen on limited exam . Musculoskeletal: not rigid . Psychiatric:unable to assess . Neurologic: no seizure no involuntary movements         Lab Data:   Basic Metabolic Panel: Recent Labs  Lab 08/23/18 1119 08/24/18 0526 08/25/18 0832  NA 134*  --   --   K 3.0* 3.7 4.1  CL 105  --   --   CO2 24  --   --   GLUCOSE 106*  --   --   BUN 13  --   --   CREATININE 0.83  --   --   CALCIUM 7.8*  --   --     ABG: No results for input(s): PHART, PCO2ART, PO2ART, HCO3, O2SAT in the last 168 hours.  Liver Function Tests: No results for input(s): AST, ALT, ALKPHOS, BILITOT, PROT, ALBUMIN in the last 168 hours. No results for input(s): LIPASE, AMYLASE in the last 168 hours. No results for input(s): AMMONIA in the last 168 hours.  CBC: Recent Labs  Lab 08/23/18 1119  WBC 8.2  HGB 7.0*  HCT 22.9*  MCV 85.4  PLT 681*    Cardiac Enzymes: No results for input(s): CKTOTAL, CKMB, CKMBINDEX, TROPONINI in the last 168 hours.  BNP (last 3 results) No results for input(s): BNP in the last 8760 hours.  ProBNP (last  3 results) No results for input(s): PROBNP in the last 8760 hours.  Radiological Exams: No results found.  Assessment/Plan Active Problems:   Acute on chronic respiratory failure with hypoxia (HCC)   Spinal cord injury, cervical region [facility])   TBI (traumatic brain injury) (HCC)   Lobar pneumonia, unspecified organism (HCC)   MRSA bacteremia   1. Acute on chronic respiratory failure with hypoxia continue to wean T collar patient's goal is for 24 hours 2. Spinal cord injury unchanged continue with supportive care. 3. Traumatic brain injury at baseline we will continue to monitor 4. Lobar pneumonia unspecified continue with supportive care 5. MRSA bacteremia treated we will monitor   I have personally seen and evaluated the patient, evaluated laboratory and imaging results, formulated the assessment and plan and placed orders. The Patient requires high complexity decision making for assessment and support.  Case was discussed on Rounds with the Respiratory Therapy Staff  Yevonne Pax, MD [facility] Pulmonary Critical Care Medicine Sleep Medicine

## 2018-08-27 ENCOUNTER — Other Ambulatory Visit (HOSPITAL_COMMUNITY): Payer: Self-pay

## 2018-08-27 ENCOUNTER — Encounter (HOSPITAL_COMMUNITY): Payer: Self-pay | Admitting: Interventional Radiology

## 2018-08-27 HISTORY — PX: IR GASTROSTOMY TUBE MOD SED: IMG625

## 2018-08-27 LAB — BASIC METABOLIC PANEL
Anion gap: 8 (ref 5–15)
BUN: 15 mg/dL (ref 8–23)
CO2: 24 mmol/L (ref 22–32)
Calcium: 8 mg/dL — ABNORMAL LOW (ref 8.9–10.3)
Chloride: 101 mmol/L (ref 98–111)
Creatinine, Ser: 0.77 mg/dL (ref 0.61–1.24)
GFR calc Af Amer: >60 mL/min (ref >60)
GFR calc non Af Amer: >60 mL/min (ref >60)
Glucose, Bld: 89 mg/dL (ref 70–99)
Potassium: 3.4 mmol/L — ABNORMAL LOW (ref 3.5–5.1)
Sodium: 133 mmol/L — ABNORMAL LOW (ref 135–145)

## 2018-08-27 LAB — CBC
HCT: 25.6 % — ABNORMAL LOW (ref 39.0–52.0)
Hemoglobin: 8.2 g/dL — ABNORMAL LOW (ref 13.0–17.0)
MCH: 27.3 pg (ref 26.0–34.0)
MCHC: 32 g/dL (ref 30.0–36.0)
MCV: 85.3 fL (ref 80.0–100.0)
Platelets: 653 10*3/uL — ABNORMAL HIGH (ref 150–400)
RBC: 3 MIL/uL — ABNORMAL LOW (ref 4.22–5.81)
RDW: 15.2 % (ref 11.5–15.5)
WBC: 7.6 10*3/uL (ref 4.0–10.5)
nRBC: 0 % (ref 0.0–0.2)

## 2018-08-27 MED ORDER — FENTANYL CITRATE (PF) 100 MCG/2ML IJ SOLN
INTRAMUSCULAR | Status: DC | PRN
  Administered 2018-08-27: 50 ug via INTRAVENOUS

## 2018-08-27 MED ORDER — MIDAZOLAM HCL 2 MG/2ML IJ SOLN
INTRAMUSCULAR | Status: DC | PRN
  Administered 2018-08-27: 1 mg via INTRAVENOUS

## 2018-08-27 MED ORDER — LIDOCAINE HCL 1 % IJ SOLN
INTRAMUSCULAR | Status: AC
  Filled 2018-08-27: qty 20

## 2018-08-27 MED ORDER — GLUCAGON HCL RDNA (DIAGNOSTIC) 1 MG IJ SOLR
INTRAMUSCULAR | Status: AC
  Filled 2018-08-27: qty 1

## 2018-08-27 MED ORDER — MIDAZOLAM HCL 2 MG/2ML IJ SOLN
INTRAMUSCULAR | Status: AC
  Filled 2018-08-27: qty 2

## 2018-08-27 MED ORDER — IOPAMIDOL (ISOVUE-300) INJECTION 61%
INTRAVENOUS | Status: AC
  Administered 2018-08-27: 20 mL
  Filled 2018-08-27: qty 100

## 2018-08-27 MED ORDER — GLUCAGON HCL RDNA (DIAGNOSTIC) 1 MG IJ SOLR
INTRAMUSCULAR | Status: DC | PRN
  Administered 2018-08-27: 1 mg via INTRAVENOUS

## 2018-08-27 MED ORDER — FENTANYL CITRATE (PF) 100 MCG/2ML IJ SOLN
INTRAMUSCULAR | Status: AC
  Filled 2018-08-27: qty 2

## 2018-08-27 MED ORDER — LIDOCAINE HCL 1 % IJ SOLN
INTRAMUSCULAR | Status: DC | PRN
  Administered 2018-08-27: 5 mL

## 2018-08-27 NOTE — Progress Notes (Addendum)
Pulmonary Critical Care Medicine [facility] [place]   PULMONARY CRITICAL CARE SERVICE  PROGRESS NOTE  Date of Service: 08/27/2018  Wayne Buckley  TGG:[id number]  DOB: 08-01-1950   DOA: 07/25/2018  Referring Physician: Carron Curie, MD  HPI: Wayne Buckley is a 68 y.o. male seen for follow up of Acute on Chronic Respiratory Failure.  Patient has been off ventilator for 48 hours.  Remains on aerosol trach collar 28% FiO2.  Doing well with good saturations.  Medications: Reviewed on Rounds  Physical Exam:  Vitals: Pulse 105 respirations 14 BP 118/68 O2 sat 90% temp 97.6  Ventilator Settings not currently on ventilator  . General: Comfortable at this time . Eyes: Grossly normal lids, irises & conjunctiva . ENT: grossly tongue is normal . Neck: no obvious mass . Cardiovascular: S1 S2 normal no gallop . Respiratory: Rales or rhonchi noted . Abdomen: soft . Skin: no rash seen on limited exam . Musculoskeletal: not rigid . Psychiatric:unable to assess . Neurologic: no seizure no involuntary movements         Lab Data:   Basic Metabolic Panel: Recent Labs  Lab 08/23/18 1119 08/24/18 0526 08/25/18 0832 08/27/18 0530  NA 134*  --   --  133*  K 3.0* 3.7 4.1 3.4*  CL 105  --   --  101  CO2 24  --   --  24  GLUCOSE 106*  --   --  89  BUN 13  --   --  15  CREATININE 0.83  --   --  0.77  CALCIUM 7.8*  --   --  8.0*    ABG: No results for input(s): PHART, PCO2ART, PO2ART, HCO3, O2SAT in the last 168 hours.  Liver Function Tests: No results for input(s): AST, ALT, ALKPHOS, BILITOT, PROT, ALBUMIN in the last 168 hours. No results for input(s): LIPASE, AMYLASE in the last 168 hours. No results for input(s): AMMONIA in the last 168 hours.  CBC: Recent Labs  Lab 08/23/18 1119 08/27/18 0530  WBC 8.2 7.6  HGB 7.0* 8.2*  HCT 22.9* 25.6*  MCV 85.4 85.3  PLT 681* 653*    Cardiac Enzymes: No results for input(s): CKTOTAL, CKMB, CKMBINDEX, TROPONINI in the last 168  hours.  BNP (last 3 results) No results for input(s): BNP in the last 8760 hours.  ProBNP (last 3 results) No results for input(s): PROBNP in the last 8760 hours.  Radiological Exams: Ir Gastrostomy Tube Mod Sed  Result Date: 08/27/2018 INDICATION: 68 year old male with dysphagia in need of percutaneous gastrostomy tube placement. EXAM: Fluoroscopically guided placement of percutaneous pull-through gastrostomy tube Interventional Radiologist:  Sterling Big, MD MEDICATIONS: 2 g Ancef, 1 mg glucagon; Antibiotics were administered within 1 hour of the procedure. ANESTHESIA/SEDATION: 50 mcg fentanyl administered. This does not constitute conscious sedation. CONTRAST:  20 mL Isovue administered into the gastric lumen FLUOROSCOPY TIME:  Fluoroscopy Time: 4 minutes 24 seconds (17 mGy). COMPLICATIONS: None immediate. PROCEDURE: Informed written consent was obtained from the patient after a thorough discussion of the procedural risks, benefits and alternatives. All questions were addressed. Maximal Sterile Barrier Technique was utilized including caps, mask, sterile gowns, sterile gloves, sterile drape, hand hygiene and skin antiseptic. A timeout was performed prior to the initiation of the procedure. Maximal barrier sterile technique utilized including caps, mask, sterile gowns, sterile gloves, large sterile drape, hand hygiene, and chlorhexadine skin prep. An angled catheter was advanced over a wire under fluoroscopic guidance through the nose, down the  esophagus and into the body of the stomach. The stomach was then insufflated with several 100 ml of air. Fluoroscopy confirmed location of the gastric bubble, as well as inferior displacement of the barium stained colon. Under direct fluoroscopic guidance, a single T-tack was placed, and the anterior gastric wall drawn up against the anterior abdominal wall. Percutaneous access was then obtained into the mid gastric body with an 18 gauge sheath needle.  Aspiration of air, and injection of contrast material under fluoroscopy confirmed needle placement. An Amplatz wire was advanced in the gastric body and the access needle exchanged for a 9-French vascular sheath. A snare device was advanced through the vascular sheath and an Amplatz wire advanced through the angled catheter. The Amplatz wire was successfully snared and this was pulled up through the esophagus and out the mouth. A 20-French Burnell Blanks MIC-PEG tube was then connected to the snare and pulled through the mouth, down the esophagus, into the stomach and out to the anterior abdominal wall. Hand injection of contrast material confirmed intragastric location. The T-tack retention suture was then cut. The pull through peg tube was then secured with the external bumper and capped. The patient will be observed for several hours with the newly placed tube on low wall suction to evaluate for any post procedure complication. The patient tolerated the procedure well, there is no immediate complication. IMPRESSION: Successful placement of a 20 French pull through gastrostomy tube. Electronically Signed   By: Malachy Moan M.D.   On: 08/27/2018 18:02   Dg Abd Portable 1v  Result Date: 08/27/2018 CLINICAL DATA:  Gastrostomy status EXAM: PORTABLE ABDOMEN - 1 VIEW COMPARISON:  07/31/2018 FINDINGS: Feeding catheter is again noted within the proximal to mid jejunum. IVC filter is noted to a to the left but stable from the previous exam. Drainage catheter in the left base is noted. Contrast material is seen within the proximal colon with only minimal contrast within the transverse colon. Degenerative changes of lumbar spine are noted. IMPRESSION: Feeding catheter within the proximal to mid jejunum. Contrast material within the proximal colon. Electronically Signed   By: Alcide Clever M.D.   On: 08/27/2018 07:47    Assessment/Plan Active Problems:   Acute on chronic respiratory failure with hypoxia (HCC)    Spinal cord injury, cervical region [facility])   TBI (traumatic brain injury) (HCC)   Lobar pneumonia, unspecified organism (HCC)   MRSA bacteremia   1. Acute on chronic respiratory failure with hypoxia continue to wean T collar patient's goal 24 hours. 2. Spinal cord injury unchanged continue supportive care 3. Traumatic brain injury at baseline we will continue to monitor 4. Lobar pneumonia unspecified continue supportive care 5. MRSA bacteremia treated we will continue to monitor   I have personally seen and evaluated the patient, evaluated laboratory and imaging results, formulated the assessment and plan and placed orders. The Patient requires high complexity decision making for assessment and support.  Case was discussed on Rounds with the Respiratory Therapy Staff  Yevonne Pax, MD [facility] Pulmonary Critical Care Medicine Sleep Medicine

## 2018-08-27 NOTE — Procedures (Signed)
Interventional Radiology Procedure Note  Procedure: Placement of percutaneous 20F pull-through gastrostomy tube. Complications: None Recommendations: - NPO except for sips and chips remainder of today and overnight - Maintain G-tube to LWS until tomorrow morning  - May advance diet as tolerated and begin using tube tomorrow morning  Signed,  Darshay Deupree K. Deryn Massengale, MD   

## 2018-08-28 ENCOUNTER — Other Ambulatory Visit (HOSPITAL_COMMUNITY): Payer: Self-pay

## 2018-08-28 LAB — BASIC METABOLIC PANEL
Anion gap: 6 (ref 5–15)
BUN: 14 mg/dL (ref 8–23)
CO2: 23 mmol/L (ref 22–32)
Calcium: 8 mg/dL — ABNORMAL LOW (ref 8.9–10.3)
Chloride: 104 mmol/L (ref 98–111)
Creatinine, Ser: 0.78 mg/dL (ref 0.61–1.24)
GFR calc Af Amer: >60 mL/min (ref >60)
Glucose, Bld: 88 mg/dL (ref 70–99)
Potassium: 3.7 mmol/L (ref 3.5–5.1)
Sodium: 133 mmol/L — ABNORMAL LOW (ref 135–145)

## 2018-08-28 LAB — MAGNESIUM: Magnesium: 1.7 mg/dL (ref 1.7–2.4)

## 2018-08-28 LAB — CBC
HCT: 25.6 % — ABNORMAL LOW (ref 39.0–52.0)
Hemoglobin: 7.9 g/dL — ABNORMAL LOW (ref 13.0–17.0)
MCH: 26.1 pg (ref 26.0–34.0)
MCHC: 30.9 g/dL (ref 30.0–36.0)
MCV: 84.5 fL (ref 80.0–100.0)
NRBC: 0 % (ref 0.0–0.2)
Platelets: 806 10*3/uL — ABNORMAL HIGH (ref 150–400)
RBC: 3.03 MIL/uL — ABNORMAL LOW (ref 4.22–5.81)
RDW: 14.9 % (ref 11.5–15.5)
WBC: 11.1 10*3/uL — ABNORMAL HIGH (ref 4.0–10.5)

## 2018-08-28 LAB — TROPONIN I: Troponin I: <0.03 ng/mL (ref <0.03)

## 2018-08-28 LAB — POTASSIUM: Potassium: 3.7 mmol/L (ref 3.5–5.1)

## 2018-08-28 LAB — CK TOTAL AND CKMB (NOT AT ARMC)
CK, MB: 2.7 ng/mL (ref 0.5–5.0)
Relative Index: INVALID (ref 0.0–2.5)
Total CK: 45 U/L — ABNORMAL LOW (ref 49–397)

## 2018-08-28 LAB — BLOOD GAS, ARTERIAL
Acid-base deficit: 0.3 mmol/L (ref 0.0–2.0)
Bicarbonate: 23.2 mmol/L (ref 20.0–28.0)
FIO2: 60
O2 Saturation: 98.9 %
Patient temperature: 100.6
pCO2 arterial: 36.1 mmHg (ref 32.0–48.0)
pH, Arterial: 7.431 (ref 7.350–7.450)
pO2, Arterial: 123 mmHg — ABNORMAL HIGH (ref 83.0–108.0)

## 2018-08-28 NOTE — Progress Notes (Addendum)
Pulmonary Critical Care Medicine [facility] [place]   PULMONARY CRITICAL CARE SERVICE  PROGRESS NOTE  Date of Service: 08/28/2018  Wayne Buckley  YIR:[id number]  DOB: Jul 27, 1950   DOA: 07/25/2018  Referring Physician: Carron Curie, MD  HPI: Wayne Buckley is a 68 y.o. male seen for follow up of Acute on Chronic Respiratory Failure.  Patient is now been off ventilator for 3 days.  He remains on T collar with 28% FiO2.  No acute distress noted at this time.  Patient is doing well.  Medications: Reviewed on Rounds  Physical Exam:  Vitals: Pulse 124 respirations 20 BP 150/89 O2 sat 90% temp 96.6  Ventilator Settings not currently on ventilator  . General: Comfortable at this time . Eyes: Grossly normal lids, irises & conjunctiva . ENT: grossly tongue is normal . Neck: no obvious mass . Cardiovascular: S1 S2 normal no gallop . Respiratory: No rales or rhonchi noted . Abdomen: soft . Skin: no rash seen on limited exam . Musculoskeletal: not rigid . Psychiatric:unable to assess . Neurologic: no seizure no involuntary movements         Lab Data:   Basic Metabolic Panel: Recent Labs  Lab 08/23/18 1119 08/24/18 0526 08/25/18 0832 08/27/18 0530 08/28/18 0540  NA 134*  --   --  133*  --   K 3.0* 3.7 4.1 3.4* 3.7  CL 105  --   --  101  --   CO2 24  --   --  24  --   GLUCOSE 106*  --   --  89  --   BUN 13  --   --  15  --   CREATININE 0.83  --   --  0.77  --   CALCIUM 7.8*  --   --  8.0*  --     ABG: No results for input(s): PHART, PCO2ART, PO2ART, HCO3, O2SAT in the last 168 hours.  Liver Function Tests: No results for input(s): AST, ALT, ALKPHOS, BILITOT, PROT, ALBUMIN in the last 168 hours. No results for input(s): LIPASE, AMYLASE in the last 168 hours. No results for input(s): AMMONIA in the last 168 hours.  CBC: Recent Labs  Lab 08/23/18 1119 08/27/18 0530  WBC 8.2 7.6  HGB 7.0* 8.2*  HCT 22.9* 25.6*  MCV 85.4 85.3  PLT 681* 653*    Cardiac  Enzymes: No results for input(s): CKTOTAL, CKMB, CKMBINDEX, TROPONINI in the last 168 hours.  BNP (last 3 results) No results for input(s): BNP in the last 8760 hours.  ProBNP (last 3 results) No results for input(s): PROBNP in the last 8760 hours.  Radiological Exams: Ir Gastrostomy Tube Mod Sed  Result Date: 08/27/2018 INDICATION: 68 year old male with dysphagia in need of percutaneous gastrostomy tube placement. EXAM: Fluoroscopically guided placement of percutaneous pull-through gastrostomy tube Interventional Radiologist:  Sterling Big, MD MEDICATIONS: 2 g Ancef, 1 mg glucagon; Antibiotics were administered within 1 hour of the procedure. ANESTHESIA/SEDATION: 50 mcg fentanyl administered. This does not constitute conscious sedation. CONTRAST:  20 mL Isovue administered into the gastric lumen FLUOROSCOPY TIME:  Fluoroscopy Time: 4 minutes 24 seconds (17 mGy). COMPLICATIONS: None immediate. PROCEDURE: Informed written consent was obtained from the patient after a thorough discussion of the procedural risks, benefits and alternatives. All questions were addressed. Maximal Sterile Barrier Technique was utilized including caps, mask, sterile gowns, sterile gloves, sterile drape, hand hygiene and skin antiseptic. A timeout was performed prior to the initiation of the procedure. Maximal barrier sterile  technique utilized including caps, mask, sterile gowns, sterile gloves, large sterile drape, hand hygiene, and chlorhexadine skin prep. An angled catheter was advanced over a wire under fluoroscopic guidance through the nose, down the esophagus and into the body of the stomach. The stomach was then insufflated with several 100 ml of air. Fluoroscopy confirmed location of the gastric bubble, as well as inferior displacement of the barium stained colon. Under direct fluoroscopic guidance, a single T-tack was placed, and the anterior gastric wall drawn up against the anterior abdominal wall. Percutaneous  access was then obtained into the mid gastric body with an 18 gauge sheath needle. Aspiration of air, and injection of contrast material under fluoroscopy confirmed needle placement. An Amplatz wire was advanced in the gastric body and the access needle exchanged for a 9-French vascular sheath. A snare device was advanced through the vascular sheath and an Amplatz wire advanced through the angled catheter. The Amplatz wire was successfully snared and this was pulled up through the esophagus and out the mouth. A 20-French Burnell Blanks MIC-PEG tube was then connected to the snare and pulled through the mouth, down the esophagus, into the stomach and out to the anterior abdominal wall. Hand injection of contrast material confirmed intragastric location. The T-tack retention suture was then cut. The pull through peg tube was then secured with the external bumper and capped. The patient will be observed for several hours with the newly placed tube on low wall suction to evaluate for any post procedure complication. The patient tolerated the procedure well, there is no immediate complication. IMPRESSION: Successful placement of a 20 French pull through gastrostomy tube. Electronically Signed   By: Malachy Moan M.D.   On: 08/27/2018 18:02   Dg Abd Portable 1v  Result Date: 08/27/2018 CLINICAL DATA:  Gastrostomy status EXAM: PORTABLE ABDOMEN - 1 VIEW COMPARISON:  07/31/2018 FINDINGS: Feeding catheter is again noted within the proximal to mid jejunum. IVC filter is noted to a to the left but stable from the previous exam. Drainage catheter in the left base is noted. Contrast material is seen within the proximal colon with only minimal contrast within the transverse colon. Degenerative changes of lumbar spine are noted. IMPRESSION: Feeding catheter within the proximal to mid jejunum. Contrast material within the proximal colon. Electronically Signed   By: Alcide Clever M.D.   On: 08/27/2018 07:47     Assessment/Plan Active Problems:   Acute on chronic respiratory failure with hypoxia (HCC)   Spinal cord injury, cervical region [facility])   TBI (traumatic brain injury) (HCC)   Lobar pneumonia, unspecified organism (HCC)   MRSA bacteremia   1. Acute on chronic respiratory failure with hypoxia continue with trach collar as tolerated.  Continue secretion management pulmonary toilet. 2. Spinal cord injury unchanged continue supportive care 3. Traumatic brain injury at baseline continue to monitor 4. Lobar pneumonia unspecified continue supportive care 5. MRSA bacteremia treated continue to monitor   I have personally seen and evaluated the patient, evaluated laboratory and imaging results, formulated the assessment and plan and placed orders. The Patient requires high complexity decision making for assessment and support.  Case was discussed on Rounds with the Respiratory Therapy Staff  Yevonne Pax, MD [facility] Pulmonary Critical Care Medicine Sleep Medicine

## 2018-08-28 NOTE — Progress Notes (Signed)
Referring Physician(s): Dr. Sharyon Medicus  Supervising Physician: Simonne Come  Patient Status:  [facility]  Chief Complaint: Follow up gastrostomy tube placement 3/10 in IR by Dr. Archer Asa.  Subjective:  Patient laying in bed watching TV, wife at bedside. TPN infusing without issue.   Allergies: Patient has no known allergies.  Medications: Prior to Admission medications   Medication Sig Start Date End Date Taking? Authorizing Provider  acetaminophen (TYLENOL) 325 MG tablet Take 2 tablets (650 mg total) by mouth every 6 (six) hours as needed for mild pain. 04/03/18   Angiulli, Mcarthur Rossetti, PA-C  apixaban (ELIQUIS) 5 MG TABS tablet Take 1 tablet (5 mg total) by mouth 2 (two) times daily. 04/03/18   Angiulli, Mcarthur Rossetti, PA-C  bisacodyl (DULCOLAX) 10 MG suppository Place 1 suppository (10 mg total) rectally daily at 6 (six) AM. Patient taking differently: Place 10 mg rectally daily as needed for mild constipation.  04/03/18   Angiulli, Mcarthur Rossetti, PA-C  carvedilol (COREG) 6.25 MG tablet Take 1 tablet (6.25 mg total) by mouth 2 (two) times daily with a meal. 07/25/18   Rolly Salter, MD  collagenase (SANTYL) ointment Apply topically daily. Cleanse gluteal wounds with NS.  Apply Santyl to open areas.  Cover with NS moist 2x2 and foam dressings.  Peel back foam and change daily. Foam is changed every three days and PRN soilage. Patient not taking: Reported on 07/05/2018 05/15/18   Rai, Delene Ruffini, MD  ferrous sulfate 325 (65 FE) MG tablet Take 1 tablet (325 mg total) by mouth 2 (two) times daily with a meal. 05/31/18   Calvert Cantor, MD  guaiFENesin (MUCINEX) 600 MG 12 hr tablet Take 1 tablet (600 mg total) by mouth 2 (two) times daily. 07/25/18   Rolly Salter, MD  HYDROcodone-acetaminophen (NORCO/VICODIN) 5-325 MG tablet Take 1 tablet by mouth every 6 (six) hours as needed for moderate pain. 07/25/18   Rolly Salter, MD  levalbuterol Pauline Aus) 0.63 MG/3ML nebulizer solution Take 3  mLs (0.63 mg total) by nebulization every 8 (eight) hours. 07/25/18   Rolly Salter, MD  liver oil-zinc oxide (DESITIN) 40 % ointment Apply topically 4 (four) times daily. Apply to sacrum, coccyx, buttocks. Patient taking differently: Apply 1 application topically 4 (four) times daily. Apply to sacrum, coccyx, buttocks. 05/14/18   Rai, Delene Ruffini, MD  polyethylene glycol (MIRALAX / GLYCOLAX) packet Take 17 g by mouth 2 (two) times daily. Patient not taking: Reported on 07/05/2018 05/14/18   Rai, Delene Ruffini, MD  polyethylene glycol (MIRALAX / GLYCOLAX) packet Take 17 g by mouth daily as needed for mild constipation or moderate constipation. 05/31/18   Calvert Cantor, MD  senna (SENOKOT) 8.6 MG TABS tablet Take 1 tablet (8.6 mg total) by mouth at bedtime. 07/25/18   Rolly Salter, MD  sodium phosphate (FLEET) 7-19 GM/118ML ENEM Place 133 mLs (1 enema total) rectally daily as needed for severe constipation. 05/31/18   Calvert Cantor, MD  tamsulosin (FLOMAX) 0.4 MG CAPS capsule Take 1 capsule (0.4 mg total) by mouth daily. 03/06/18   Leroy Sea, MD  vancomycin (VANCOCIN) 1-5 GM/200ML-% SOLN Inject 200 mLs (1,000 mg total) into the vein daily. 07/26/18   Rolly Salter, MD  vitamin C (VITAMIN C) 1000 MG tablet Take 1 tablet (1,000 mg total) by mouth 2 (two) times daily. 04/03/18   Charlton Amor, PA-C     Vital Signs: BP 110/84    Pulse (!) 113  Resp 19    SpO2 98%   Physical Exam Vitals signs reviewed.  Constitutional:      General: He is not in acute distress. HENT:     Head: Normocephalic.  Cardiovascular:     Rate and Rhythm: Tachycardia present.  Pulmonary:     Effort: Pulmonary effort is normal.     Comments: (+) trach Abdominal:     General: There is no distension.     Palpations: Abdomen is soft.     Tenderness: There is no abdominal tenderness.     Comments: (+) gastrostomy tube with TPN infusing, no leakage or active bleeding noted, dressed appropriately.   Skin:     General: Skin is warm and dry.  Neurological:     Mental Status: He is alert. Mental status is at baseline.     Imaging: Ir Gastrostomy Tube Mod Sed  Result Date: 08/27/2018 INDICATION: 68 year old male with dysphagia in need of percutaneous gastrostomy tube placement. EXAM: Fluoroscopically guided placement of percutaneous pull-through gastrostomy tube Interventional Radiologist:  Sterling Big, MD MEDICATIONS: 2 g Ancef, 1 mg glucagon; Antibiotics were administered within 1 hour of the procedure. ANESTHESIA/SEDATION: 50 mcg fentanyl administered. This does not constitute conscious sedation. CONTRAST:  20 mL Isovue administered into the gastric lumen FLUOROSCOPY TIME:  Fluoroscopy Time: 4 minutes 24 seconds (17 mGy). COMPLICATIONS: None immediate. PROCEDURE: Informed written consent was obtained from the patient after a thorough discussion of the procedural risks, benefits and alternatives. All questions were addressed. Maximal Sterile Barrier Technique was utilized including caps, mask, sterile gowns, sterile gloves, sterile drape, hand hygiene and skin antiseptic. A timeout was performed prior to the initiation of the procedure. Maximal barrier sterile technique utilized including caps, mask, sterile gowns, sterile gloves, large sterile drape, hand hygiene, and chlorhexadine skin prep. An angled catheter was advanced over a wire under fluoroscopic guidance through the nose, down the esophagus and into the body of the stomach. The stomach was then insufflated with several 100 ml of air. Fluoroscopy confirmed location of the gastric bubble, as well as inferior displacement of the barium stained colon. Under direct fluoroscopic guidance, a single T-tack was placed, and the anterior gastric wall drawn up against the anterior abdominal wall. Percutaneous access was then obtained into the mid gastric body with an 18 gauge sheath needle. Aspiration of air, and injection of contrast material under  fluoroscopy confirmed needle placement. An Amplatz wire was advanced in the gastric body and the access needle exchanged for a 9-French vascular sheath. A snare device was advanced through the vascular sheath and an Amplatz wire advanced through the angled catheter. The Amplatz wire was successfully snared and this was pulled up through the esophagus and out the mouth. A 20-French Burnell Blanks MIC-PEG tube was then connected to the snare and pulled through the mouth, down the esophagus, into the stomach and out to the anterior abdominal wall. Hand injection of contrast material confirmed intragastric location. The T-tack retention suture was then cut. The pull through peg tube was then secured with the external bumper and capped. The patient will be observed for several hours with the newly placed tube on low wall suction to evaluate for any post procedure complication. The patient tolerated the procedure well, there is no immediate complication. IMPRESSION: Successful placement of a 20 French pull through gastrostomy tube. Electronically Signed   By: Malachy Moan M.D.   On: 08/27/2018 18:02   Dg Abd Portable 1v  Result Date: 08/27/2018 CLINICAL DATA:  Gastrostomy  status EXAM: PORTABLE ABDOMEN - 1 VIEW COMPARISON:  07/31/2018 FINDINGS: Feeding catheter is again noted within the proximal to mid jejunum. IVC filter is noted to a to the left but stable from the previous exam. Drainage catheter in the left base is noted. Contrast material is seen within the proximal colon with only minimal contrast within the transverse colon. Degenerative changes of lumbar spine are noted. IMPRESSION: Feeding catheter within the proximal to mid jejunum. Contrast material within the proximal colon. Electronically Signed   By: Alcide Clever M.D.   On: 08/27/2018 07:47    Labs:  CBC: Recent Labs    08/13/18 0537 08/16/18 0609 08/23/18 1119 08/27/18 0530  WBC 7.1 7.3 8.2 7.6  HGB 7.7* 7.9* 7.0* 8.2*  HCT 25.0* 25.3*  22.9* 25.6*  PLT 455* 580* 681* 653*    COAGS: Recent Labs    05/10/18 0642 05/11/18 0615 05/12/18 0628 05/13/18 0503  07/11/18 1113 07/18/18 1048 07/26/18 0633 08/23/18 1119  INR  --   --   --   --    < > 1.34 1.34 1.53 1.9*  APTT 54* 53* 51* 54*  --   --   --   --   --    < > = values in this interval not displayed.    BMP: Recent Labs    08/13/18 0537  08/16/18 0609 08/23/18 1119 08/24/18 0526 08/25/18 0832 08/27/18 0530 08/28/18 0540  NA 136  --  134* 134*  --   --  133*  --   K 2.3*   < > 3.2* 3.0* 3.7 4.1 3.4* 3.7  CL 108  --  104 105  --   --  101  --   CO2 22  --  24 24  --   --  24  --   GLUCOSE 99  --  91 106*  --   --  89  --   BUN 5*  --  7* 13  --   --  15  --   CALCIUM 7.6*  --  7.6* 7.8*  --   --  8.0*  --   CREATININE 0.80  --  0.74 0.83  --   --  0.77  --   GFRNONAA >60  --  >60 >60  --   --  >60  --   GFRAA >60  --  >60 >60  --   --  >60  --    < > = values in this interval not displayed.    LIVER FUNCTION TESTS: Recent Labs    07/07/18 0835 07/09/18 1725 07/26/18 0633 08/05/18 2311  BILITOT 0.4 0.5 0.7 0.5  AST ALT ALKPHOS 97 96 102 133*  PROT 5.5* 5.8* 5.6* 5.8*  ALBUMIN 1.4* 1.5* 1.4* 1.5*    Assessment and Plan:  68 y/o M s/p gastrostomy tube placement 3/10 in IR. Insertion site unremarkable, TPN infusing without issue on exam today, no leakage or bleeding noted.  Please call IR with questions or concerns.   Electronically Signed: Villa Herb, PA-C 08/28/2018, 2:51 PM   I spent a total of 15 Minutes at the the patient's bedside AND on the patient's hospital floor or unit, greater than 50% of which was counseling/coordinating care for g-tube follow up.

## 2018-08-29 LAB — TROPONIN I: Troponin I: <0.03 ng/mL (ref <0.03)

## 2018-08-29 NOTE — Progress Notes (Signed)
Pulmonary Critical Care Medicine [facility] [place]   PULMONARY CRITICAL CARE SERVICE  PROGRESS NOTE  Date of Service: 08/29/2018  ZAIDIN SKIVER  BJY:[id number]  DOB: Nov 08, 1950   DOA: 07/25/2018  Referring Physician: Carron Curie, MD  HPI: Wayne Buckley is a 68 y.o. male seen for follow up of Acute on Chronic Respiratory Failure.  Patient is on T collar comfortable without distress had some desaturations noted yesterday possible airway occlusion  Medications: Reviewed on Rounds  Physical Exam:  Vitals: Temperature 97.5 pulse 93 respiratory 20 blood pressure 102/65 saturations 99%  Ventilator Settings on T collar FiO2 is 28%  . General: Comfortable at this time . Eyes: Grossly normal lids, irises & conjunctiva . ENT: grossly tongue is normal . Neck: no obvious mass . Cardiovascular: S1 S2 normal no gallop . Respiratory: No rhonchi or rales are noted at this time . Abdomen: soft . Skin: no rash seen on limited exam . Musculoskeletal: not rigid . Psychiatric:unable to assess . Neurologic: no seizure no involuntary movements         Lab Data:   Basic Metabolic Panel: Recent Labs  Lab 08/23/18 1119 08/24/18 0526 08/25/18 0832 08/27/18 0530 08/28/18 0540 08/28/18 2019  NA 134*  --   --  133*  --  133*  K 3.0* 3.7 4.1 3.4* 3.7 3.7  CL 105  --   --  101  --  104  CO2 24  --   --  24  --  23  GLUCOSE 106*  --   --  89  --  88  BUN 13  --   --  15  --  14  CREATININE 0.83  --   --  0.77  --  0.78  CALCIUM 7.8*  --   --  8.0*  --  8.0*  MG  --   --   --   --   --  1.7    ABG: Recent Labs  Lab 08/28/18 1955  PHART 7.431  PCO2ART 36.1  PO2ART 123*  HCO3 23.2  O2SAT 98.9    Liver Function Tests: No results for input(s): AST, ALT, ALKPHOS, BILITOT, PROT, ALBUMIN in the last 168 hours. No results for input(s): LIPASE, AMYLASE in the last 168 hours. No results for input(s): AMMONIA in the last 168 hours.  CBC: Recent Labs  Lab 08/23/18 1119  08/27/18 0530 08/28/18 2019  WBC 8.2 7.6 11.1*  HGB 7.0* 8.2* 7.9*  HCT 22.9* 25.6* 25.6*  MCV 85.4 85.3 84.5  PLT 681* 653* 806*    Cardiac Enzymes: Recent Labs  Lab 08/28/18 2019 08/29/18 0623  CKTOTAL 45*  --   CKMB 2.7  --   TROPONINI <0.03 <0.03    BNP (last 3 results) No results for input(s): BNP in the last 8760 hours.  ProBNP (last 3 results) No results for input(s): PROBNP in the last 8760 hours.  Radiological Exams: Ir Gastrostomy Tube Mod Sed  Result Date: 08/27/2018 INDICATION: 68 year old male with dysphagia in need of percutaneous gastrostomy tube placement. EXAM: Fluoroscopically guided placement of percutaneous pull-through gastrostomy tube Interventional Radiologist:  Sterling Big, MD MEDICATIONS: 2 g Ancef, 1 mg glucagon; Antibiotics were administered within 1 hour of the procedure. ANESTHESIA/SEDATION: 50 mcg fentanyl administered. This does not constitute conscious sedation. CONTRAST:  20 mL Isovue administered into the gastric lumen FLUOROSCOPY TIME:  Fluoroscopy Time: 4 minutes 24 seconds (17 mGy). COMPLICATIONS: None immediate. PROCEDURE: Informed written consent was obtained from the patient after  a thorough discussion of the procedural risks, benefits and alternatives. All questions were addressed. Maximal Sterile Barrier Technique was utilized including caps, mask, sterile gowns, sterile gloves, sterile drape, hand hygiene and skin antiseptic. A timeout was performed prior to the initiation of the procedure. Maximal barrier sterile technique utilized including caps, mask, sterile gowns, sterile gloves, large sterile drape, hand hygiene, and chlorhexadine skin prep. An angled catheter was advanced over a wire under fluoroscopic guidance through the nose, down the esophagus and into the body of the stomach. The stomach was then insufflated with several 100 ml of air. Fluoroscopy confirmed location of the gastric bubble, as well as inferior displacement of  the barium stained colon. Under direct fluoroscopic guidance, a single T-tack was placed, and the anterior gastric wall drawn up against the anterior abdominal wall. Percutaneous access was then obtained into the mid gastric body with an 18 gauge sheath needle. Aspiration of air, and injection of contrast material under fluoroscopy confirmed needle placement. An Amplatz wire was advanced in the gastric body and the access needle exchanged for a 9-French vascular sheath. A snare device was advanced through the vascular sheath and an Amplatz wire advanced through the angled catheter. The Amplatz wire was successfully snared and this was pulled up through the esophagus and out the mouth. A 20-French Burnell Blanks MIC-PEG tube was then connected to the snare and pulled through the mouth, down the esophagus, into the stomach and out to the anterior abdominal wall. Hand injection of contrast material confirmed intragastric location. The T-tack retention suture was then cut. The pull through peg tube was then secured with the external bumper and capped. The patient will be observed for several hours with the newly placed tube on low wall suction to evaluate for any post procedure complication. The patient tolerated the procedure well, there is no immediate complication. IMPRESSION: Successful placement of a 20 French pull through gastrostomy tube. Electronically Signed   By: Malachy Moan M.D.   On: 08/27/2018 18:02   Dg Chest Port 1 View  Result Date: 08/28/2018 CLINICAL DATA:  Post cardiac arrest. EXAM: PORTABLE CHEST 1 VIEW COMPARISON:  08/13/2018 FINDINGS: Tracheostomy. Right central venous catheter with tip over the cavoatrial junction region. No pneumothorax. Since the previous study, there is increasing left pleural effusion with increasing consolidation and volume loss in the left mid and lower lungs. This may represent developing pneumonia or centrally obstructing process. Pigtail drainage catheter in the  left lung base. Calcified granulomas in the left hilum. Heart size is normal for technique. Calcification of the aorta. Degenerative changes in the spine and shoulders. Postoperative changes in the cervical spine. IMPRESSION: Appliances appear in satisfactory position. Increasing left pleural effusion with increasing consolidation and volume loss in the left mid and lower lung. Electronically Signed   By: Burman Nieves M.D.   On: 08/28/2018 19:34    Assessment/Plan Active Problems:   Acute on chronic respiratory failure with hypoxia (HCC)   Spinal cord injury, cervical region [facility])   TBI (traumatic brain injury) (HCC)   Lobar pneumonia, unspecified organism (HCC)   MRSA bacteremia   1. Acute on chronic respiratory failure hypoxia continue with T collar trials continue pulmonary toilet secretion management 2. Spinal cord injury unchanged continue supportive care 3. Traumatic brain injury at baseline 4. Lobar pneumonia treated improved 5. MRSA bacteremia treated clinically improved we will continue with supportive care   I have personally seen and evaluated the patient, evaluated laboratory and imaging results, formulated the assessment  and plan and placed orders. The Patient requires high complexity decision making for assessment and support.  Case was discussed on Rounds with the Respiratory Therapy Staff  Allyne Gee, MD [facility] Pulmonary Critical Care Medicine Sleep Medicine

## 2018-08-30 NOTE — Progress Notes (Signed)
Pulmonary Critical Care Medicine [facility] [place]   PULMONARY CRITICAL CARE SERVICE  PROGRESS NOTE  Date of Service: 08/30/2018  Wayne Buckley  HWY:[id number]  DOB: 10-Apr-1951   DOA: 07/25/2018  Referring Physician: Carron Curie, MD  HPI: Wayne Buckley is a 68 y.o. male seen for follow up of Acute on Chronic Respiratory Failure.  Patient is on T collar currently on 28% FiO2 good saturations are noted  Medications: Reviewed on Rounds  Physical Exam:  Vitals: Temperature 98.0 pulse 98 respiratory 23 blood pressure 111/64 saturations 95%  Ventilator Settings off the ventilator on T collar right now  . General: Comfortable at this time . Eyes: Grossly normal lids, irises & conjunctiva . ENT: grossly tongue is normal . Neck: no obvious mass . Cardiovascular: S1 S2 normal no gallop . Respiratory: No rhonchi or rales are noted . Abdomen: soft . Skin: no rash seen on limited exam . Musculoskeletal: not rigid . Psychiatric:unable to assess . Neurologic: no seizure no involuntary movements         Lab Data:   Basic Metabolic Panel: Recent Labs  Lab 08/24/18 0526 08/25/18 0832 08/27/18 0530 08/28/18 0540 08/28/18 2019  NA  --   --  133*  --  133*  K 3.7 4.1 3.4* 3.7 3.7  CL  --   --  101  --  104  CO2  --   --  24  --  23  GLUCOSE  --   --  89  --  88  BUN  --   --  15  --  14  CREATININE  --   --  0.77  --  0.78  CALCIUM  --   --  8.0*  --  8.0*  MG  --   --   --   --  1.7    ABG: Recent Labs  Lab 08/28/18 1955  PHART 7.431  PCO2ART 36.1  PO2ART 123*  HCO3 23.2  O2SAT 98.9    Liver Function Tests: No results for input(s): AST, ALT, ALKPHOS, BILITOT, PROT, ALBUMIN in the last 168 hours. No results for input(s): LIPASE, AMYLASE in the last 168 hours. No results for input(s): AMMONIA in the last 168 hours.  CBC: Recent Labs  Lab 08/27/18 0530 08/28/18 2019  WBC 7.6 11.1*  HGB 8.2* 7.9*  HCT 25.6* 25.6*  MCV 85.3 84.5  PLT 653* 806*     Cardiac Enzymes: Recent Labs  Lab 08/28/18 2019 08/29/18 0623  CKTOTAL 45*  --   CKMB 2.7  --   TROPONINI <0.03 <0.03    BNP (last 3 results) No results for input(s): BNP in the last 8760 hours.  ProBNP (last 3 results) No results for input(s): PROBNP in the last 8760 hours.  Radiological Exams: Dg Chest Port 1 View  Result Date: 08/28/2018 CLINICAL DATA:  Post cardiac arrest. EXAM: PORTABLE CHEST 1 VIEW COMPARISON:  08/13/2018 FINDINGS: Tracheostomy. Right central venous catheter with tip over the cavoatrial junction region. No pneumothorax. Since the previous study, there is increasing left pleural effusion with increasing consolidation and volume loss in the left mid and lower lungs. This may represent developing pneumonia or centrally obstructing process. Pigtail drainage catheter in the left lung base. Calcified granulomas in the left hilum. Heart size is normal for technique. Calcification of the aorta. Degenerative changes in the spine and shoulders. Postoperative changes in the cervical spine. IMPRESSION: Appliances appear in satisfactory position. Increasing left pleural effusion with increasing consolidation and volume  loss in the left mid and lower lung. Electronically Signed   By: Burman Nieves M.D.   On: 08/28/2018 19:34    Assessment/Plan Active Problems:   Acute on chronic respiratory failure with hypoxia (HCC)   Spinal cord injury, cervical region [facility])   TBI (traumatic brain injury) (HCC)   Lobar pneumonia, unspecified organism (HCC)   MRSA bacteremia   1. Acute on chronic respiratory failure with hypoxia patient is on 28% FiO2 on T collar continue secretion management pulmonary toilet 2. Spinal cord injury grossly unchanged we will continue to monitor 3. TBI unchanged supportive care 4. Lobar pneumonia treated clinically improving 5. MRSA bacteremia treated resolved   I have personally seen and evaluated the patient, evaluated laboratory and imaging  results, formulated the assessment and plan and placed orders. The Patient requires high complexity decision making for assessment and support.  Case was discussed on Rounds with the Respiratory Therapy Staff  Yevonne Pax, MD [facility] Pulmonary Critical Care Medicine Sleep Medicine

## 2018-08-31 ENCOUNTER — Other Ambulatory Visit (HOSPITAL_COMMUNITY): Payer: Self-pay

## 2018-08-31 NOTE — Progress Notes (Addendum)
Pulmonary Critical Care Medicine [facility] [place]   PULMONARY CRITICAL CARE SERVICE  PROGRESS NOTE  Date of Service: 08/31/2018  Wayne Buckley  DQQ:[id number]  DOB: [birth year]   DOA: 07/25/2018  Referring Physician: Carron Curie, MD  HPI: Wayne Buckley is a 68 y.o. male seen for follow up of Acute on Chronic Respiratory Failure.  Patient is doing well today on 28% FiO2 via trach collar.  Apparently was the subject of a rapid response last night due to a mucous plug.   Medications: Reviewed on Rounds  Physical Exam:  Vitals: Pulse 89 respirations 21 BP 104/70 O2 sat 98% temp 97.7  Ventilator Settings T collar 28% FiO2 at this time.  . General: Comfortable at this time . Eyes: Grossly normal lids, irises & conjunctiva . ENT: grossly tongue is normal . Neck: no obvious mass . Cardiovascular: S1 S2 normal no gallop . Respiratory: No rales or rhonchi noted . Abdomen: soft . Skin: no rash seen on limited exam . Musculoskeletal: not rigid . Psychiatric:unable to assess . Neurologic: no seizure no involuntary movements         Lab Data:   Basic Metabolic Panel: Recent Labs  Lab 08/25/18 0832 08/27/18 0530 08/28/18 0540 08/28/18 2019  NA  --  133*  --  133*  K 4.1 3.4* 3.7 3.7  CL  --  101  --  104  CO2  --  24  --  23  GLUCOSE  --  89  --  88  BUN  --  15  --  14  CREATININE  --  0.77  --  0.78  CALCIUM  --  8.0*  --  8.0*  MG  --   --   --  1.7    ABG: Recent Labs  Lab 08/28/18 1955  PHART 7.431  PCO2ART 36.1  PO2ART 123*  HCO3 23.2  O2SAT 98.9    Liver Function Tests: No results for input(s): AST, ALT, ALKPHOS, BILITOT, PROT, ALBUMIN in the last 168 hours. No results for input(s): LIPASE, AMYLASE in the last 168 hours. No results for input(s): AMMONIA in the last 168 hours.  CBC: Recent Labs  Lab 08/27/18 0530 08/28/18 2019  WBC 7.6 11.1*  HGB 8.2* 7.9*  HCT 25.6* 25.6*  MCV 85.3 84.5  PLT 653* 806*    Cardiac Enzymes: Recent Labs   Lab 08/28/18 2019 08/29/18 0623  CKTOTAL 45*  --   CKMB 2.7  --   TROPONINI <0.03 <0.03    BNP (last 3 results) No results for input(s): BNP in the last 8760 hours.  ProBNP (last 3 results) No results for input(s): PROBNP in the last 8760 hours.  Radiological Exams: Ct Chest Wo Contrast  Result Date: 08/31/2018 CLINICAL DATA:  Left-sided chest tube. Acute on chronic respiratory failure. Hypoxia. EXAM: CT CHEST WITHOUT CONTRAST TECHNIQUE: Multidetector CT imaging of the chest was performed following the standard protocol without IV contrast. COMPARISON:  None. FINDINGS: Cardiovascular: The thoracic aorta measures up to 4.1 cm in the ascending portion. Atherosclerotic changes in the thoracic aorta. The central pulmonary arteries are unremarkable. The heart is unchanged. Coronary artery calcifications again identified. Mediastinum/Nodes: The thyroid and esophagus are unremarkable. Calcified nodes are seen in the mediastinum and left hilum, unchanged. No uncalcified adenopathy identified. No pericardial effusion. Lungs/Pleura: Patient has a tracheostomy tube which appears to be in good position. There is a small amount of debris at the level of the tracheostomy. The trachea and right-sided airways are  normal. There is debris within the left mainstem bronchus. There is a left-sided pleural effusion which contains a chest tube. There is no significant aeration within the left lung. No pneumothorax. A tiny nodule in the right apex measuring 3 mm is unchanged. There is significant degenerative changes in the right first costochondral junction with adjacent lung changes that appear to be chronic. There is a small right pleural effusion with associated atelectasis. No other abnormalities identified on the right. Upper Abdomen: No acute abnormality. Musculoskeletal: Discitis/osteomyelitis at T10-11 and T11-12 remains. Adjacent phlegmonous collection in the paravertebral regions remains. There is a focus of  air within the phlegmon today as seen on series 3, image 146 which is an acute change. No other bony changes. IMPRESSION: 1. There is a left-sided pleural effusion which is larger in the interval. A left chest tube terminates within the pleural effusion. There is now almost no aeration of the left lung which could be due to diffuse atelectasis. It would be difficult to exclude underlying infiltrate. 2. There is debris in the left mainstem bronchus which is a new finding and may be associated with the other findings in the left lung/pleural space. 3. Right-sided pleural effusion with underlying atelectasis. 4. Osteomyelitis/discitis at T10-11 and T11-T12 with adjacent phlegmonous collections in the paravertebral region. The findings are similar in the interval although there is now a focus of air within the phlegmonous collection on series 3, image 146. Involvement of the canal was better assessed on previous MRI and not as well evaluated on this study. 5. The ascending thoracic aorta measures 4.1 cm. Recommend annual imaging followup by CTA or MRA. This recommendation follows 2010 ACCF/AHA/AATS/ACR/ASA/SCA/SCAI/SIR/STS/SVM Guidelines for the Diagnosis and Management of Patients with Thoracic Aortic Disease. Circulation. 2010; 121: L465-K354. Aortic aneurysm NOS (ICD10-I71.9) 6. Atherosclerotic changes in the thoracic aorta. Coronary artery calcifications. These results will be called to the ordering clinician or representative by the Radiologist Assistant, and communication documented in the PACS or zVision Dashboard. Aortic Atherosclerosis (ICD10-I70.0). Electronically Signed   By: Gerome Sam III M.D   On: 08/31/2018 14:44    Assessment/Plan Active Problems:   Acute on chronic respiratory failure with hypoxia (HCC)   Spinal cord injury, cervical region [facility])   TBI (traumatic brain injury) (HCC)   Lobar pneumonia, unspecified organism (HCC)   MRSA bacteremia   1. Acute on chronic respiratory failure  with hypoxia patient on 20% FiO2 on T collar.  Continue aggressive pulmonary toilet and secretion management especially due to mucous plug overnight. 2. Spinal cord injury grossly unchanged continue to monitor 3. TBI unchanged continue supportive care 4. Lobar pneumonia treated clinically improving 5. MRSA bacteremia treated resolved   I have personally seen and evaluated the patient, evaluated laboratory and imaging results, formulated the assessment and plan and placed orders. The Patient requires high complexity decision making for assessment and support.  Case was discussed on Rounds with the Respiratory Therapy Staff  Yevonne Pax, MD [facility] Pulmonary Critical Care Medicine Sleep Medicine

## 2018-09-01 NOTE — Progress Notes (Addendum)
Pulmonary Critical Care Medicine [facility] [place]   PULMONARY CRITICAL CARE SERVICE  PROGRESS NOTE  Date of Service: 09/01/2018  DRACE GATER  OMV:[id number]  DOB: 11/03/1950   DOA: 07/25/2018  Referring Physician: Carron Curie, MD  HPI: TOREN SASSER is a 68 y.o. male seen for follow up of Acute on Chronic Respiratory Failure.  Patient continues to rest on T collar 28% FiO2.  A chest CT from yesterday shows a larger left-sided pleural effusion where the patient's chest tube is.  He now has almost no aeration of the left lung due to diffuse atelectasis.  He also has a right-sided pleural effusion with underlying atelectasis.  Medications: Reviewed on Rounds  Physical Exam:  Vitals: Pulse 97 respirations 22 BP 119/70 O2 sat 94% temp 97.7  Ventilator Settings 28% FiO2 via trach collar.  . General: Comfortable at this time . Eyes: Grossly normal lids, irises & conjunctiva . ENT: grossly tongue is normal . Neck: no obvious mass . Cardiovascular: S1 S2 normal no gallop . Respiratory: Coarse breath sounds . Abdomen: soft . Skin: no rash seen on limited exam . Musculoskeletal: not rigid . Psychiatric:unable to assess . Neurologic: no seizure no involuntary movements         Lab Data:   Basic Metabolic Panel: Recent Labs  Lab 08/27/18 0530 08/28/18 0540 08/28/18 2019  NA 133*  --  133*  K 3.4* 3.7 3.7  CL 101  --  104  CO2 24  --  23  GLUCOSE 89  --  88  BUN 15  --  14  CREATININE 0.77  --  0.78  CALCIUM 8.0*  --  8.0*  MG  --   --  1.7    ABG: Recent Labs  Lab 08/28/18 1955  PHART 7.431  PCO2ART 36.1  PO2ART 123*  HCO3 23.2  O2SAT 98.9    Liver Function Tests: No results for input(s): AST, ALT, ALKPHOS, BILITOT, PROT, ALBUMIN in the last 168 hours. No results for input(s): LIPASE, AMYLASE in the last 168 hours. No results for input(s): AMMONIA in the last 168 hours.  CBC: Recent Labs  Lab 08/27/18 0530 08/28/18 2019  WBC 7.6 11.1*  HGB  8.2* 7.9*  HCT 25.6* 25.6*  MCV 85.3 84.5  PLT 653* 806*    Cardiac Enzymes: Recent Labs  Lab 08/28/18 2019 08/29/18 0623  CKTOTAL 45*  --   CKMB 2.7  --   TROPONINI <0.03 <0.03    BNP (last 3 results) No results for input(s): BNP in the last 8760 hours.  ProBNP (last 3 results) No results for input(s): PROBNP in the last 8760 hours.  Radiological Exams: Ct Chest Wo Contrast  Result Date: 08/31/2018 CLINICAL DATA:  Left-sided chest tube. Acute on chronic respiratory failure. Hypoxia. EXAM: CT CHEST WITHOUT CONTRAST TECHNIQUE: Multidetector CT imaging of the chest was performed following the standard protocol without IV contrast. COMPARISON:  None. FINDINGS: Cardiovascular: The thoracic aorta measures up to 4.1 cm in the ascending portion. Atherosclerotic changes in the thoracic aorta. The central pulmonary arteries are unremarkable. The heart is unchanged. Coronary artery calcifications again identified. Mediastinum/Nodes: The thyroid and esophagus are unremarkable. Calcified nodes are seen in the mediastinum and left hilum, unchanged. No uncalcified adenopathy identified. No pericardial effusion. Lungs/Pleura: Patient has a tracheostomy tube which appears to be in good position. There is a small amount of debris at the level of the tracheostomy. The trachea and right-sided airways are normal. There is debris within the  left mainstem bronchus. There is a left-sided pleural effusion which contains a chest tube. There is no significant aeration within the left lung. No pneumothorax. A tiny nodule in the right apex measuring 3 mm is unchanged. There is significant degenerative changes in the right first costochondral junction with adjacent lung changes that appear to be chronic. There is a small right pleural effusion with associated atelectasis. No other abnormalities identified on the right. Upper Abdomen: No acute abnormality. Musculoskeletal: Discitis/osteomyelitis at T10-11 and T11-12  remains. Adjacent phlegmonous collection in the paravertebral regions remains. There is a focus of air within the phlegmon today as seen on series 3, image 146 which is an acute change. No other bony changes. IMPRESSION: 1. There is a left-sided pleural effusion which is larger in the interval. A left chest tube terminates within the pleural effusion. There is now almost no aeration of the left lung which could be due to diffuse atelectasis. It would be difficult to exclude underlying infiltrate. 2. There is debris in the left mainstem bronchus which is a new finding and may be associated with the other findings in the left lung/pleural space. 3. Right-sided pleural effusion with underlying atelectasis. 4. Osteomyelitis/discitis at T10-11 and T11-T12 with adjacent phlegmonous collections in the paravertebral region. The findings are similar in the interval although there is now a focus of air within the phlegmonous collection on series 3, image 146. Involvement of the canal was better assessed on previous MRI and not as well evaluated on this study. 5. The ascending thoracic aorta measures 4.1 cm. Recommend annual imaging followup by CTA or MRA. This recommendation follows 2010 ACCF/AHA/AATS/ACR/ASA/SCA/SCAI/SIR/STS/SVM Guidelines for the Diagnosis and Management of Patients with Thoracic Aortic Disease. Circulation. 2010; 121: L373-S287. Aortic aneurysm NOS (ICD10-I71.9) 6. Atherosclerotic changes in the thoracic aorta. Coronary artery calcifications. These results will be called to the ordering clinician or representative by the Radiologist Assistant, and communication documented in the PACS or zVision Dashboard. Aortic Atherosclerosis (ICD10-I70.0). Electronically Signed   By: Gerome Sam III M.D   On: 08/31/2018 14:44    Assessment/Plan Active Problems:   Acute on chronic respiratory failure with hypoxia (HCC)   Spinal cord injury, cervical region [facility])   TBI (traumatic brain injury) (HCC)   Lobar  pneumonia, unspecified organism (HCC)   MRSA bacteremia   1. Acute on chronic respiratory failure with hypoxia patient is on 28% FiO2 via T collar.  Patient CT shows worsening pleural effusions.  Continue secretion management as well as aggressive pulmonary toilet. 2. Spinal cord injury grossly unchanged continue to monitor 3. TBI unchanged continue supportive care 4. [place] treated clinically improving 5. MRSA bacteremia treated resolved   I have personally seen and evaluated the patient, evaluated laboratory and imaging results, formulated the assessment and plan and placed orders. The Patient requires high complexity decision making for assessment and support.  Case was discussed on Rounds with the Respiratory Therapy Staff  Yevonne Pax, MD [facility] Pulmonary Critical Care Medicine Sleep Medicine

## 2018-09-02 ENCOUNTER — Other Ambulatory Visit (HOSPITAL_COMMUNITY): Payer: Self-pay

## 2018-09-02 LAB — CREATININE, SERUM
Creatinine, Ser: 0.78 mg/dL (ref 0.61–1.24)
GFR calc Af Amer: >60 mL/min (ref >60)
GFR calc non Af Amer: >60 mL/min (ref >60)

## 2018-09-02 LAB — VANCOMYCIN, TROUGH: Vancomycin Tr: 23 ug/mL (ref 15–20)

## 2018-09-02 NOTE — Progress Notes (Signed)
Pulmonary Critical Care Medicine [facility] [place]   PULMONARY CRITICAL CARE SERVICE  PROGRESS NOTE  Date of Service: 09/02/2018  Wayne Buckley  HFW:[id number]  DOB: 01/03/1951   DOA: 07/25/2018  Referring Physician: Carron Curie, MD  HPI: Wayne Buckley is a 68 y.o. male seen for follow up of Acute on Chronic Respiratory Failure.  Patient is on T collar right now comfortable without distress  Medications: Reviewed on Rounds  Physical Exam:  Vitals: Temperature 98.0 pulse 113 respiratory rate 26 blood pressure 113/60 saturations 96%  Ventilator Settings of the ventilator on T collar  . General: Comfortable at this time . Eyes: Grossly normal lids, irises & conjunctiva . ENT: grossly tongue is normal . Neck: no obvious mass . Cardiovascular: S1 S2 normal no gallop . Respiratory: Scattered distant rhonchi are noted . Abdomen: soft . Skin: no rash seen on limited exam . Musculoskeletal: not rigid . Psychiatric:unable to assess . Neurologic: no seizure no involuntary movements         Lab Data:   Basic Metabolic Panel: Recent Labs  Lab 08/27/18 0530 08/28/18 0540 08/28/18 2019  NA 133*  --  133*  K 3.4* 3.7 3.7  CL 101  --  104  CO2 24  --  23  GLUCOSE 89  --  88  BUN 15  --  14  CREATININE 0.77  --  0.78  CALCIUM 8.0*  --  8.0*  MG  --   --  1.7    ABG: Recent Labs  Lab 08/28/18 1955  PHART 7.431  PCO2ART 36.1  PO2ART 123*  HCO3 23.2  O2SAT 98.9    Liver Function Tests: No results for input(s): AST, ALT, ALKPHOS, BILITOT, PROT, ALBUMIN in the last 168 hours. No results for input(s): LIPASE, AMYLASE in the last 168 hours. No results for input(s): AMMONIA in the last 168 hours.  CBC: Recent Labs  Lab 08/27/18 0530 08/28/18 2019  WBC 7.6 11.1*  HGB 8.2* 7.9*  HCT 25.6* 25.6*  MCV 85.3 84.5  PLT 653* 806*    Cardiac Enzymes: Recent Labs  Lab 08/28/18 2019 08/29/18 0623  CKTOTAL 45*  --   CKMB 2.7  --   TROPONINI <0.03 <0.03     BNP (last 3 results) No results for input(s): BNP in the last 8760 hours.  ProBNP (last 3 results) No results for input(s): PROBNP in the last 8760 hours.  Radiological Exams: No results found.  Assessment/Plan Active Problems:   Acute on chronic respiratory failure with hypoxia (HCC)   Spinal cord injury, cervical region [facility])   TBI (traumatic brain injury) (HCC)   Lobar pneumonia, unspecified organism (HCC)   MRSA bacteremia   1. Acute on chronic respiratory failure with hypoxia continue with T collar weaning as tolerated 2. Spinal cord injury unchanged 3. Traumatic brain injury stable we will continue to monitor 4. Lobar pneumonia treated improved 5. MRSA bacteremia treated improved   I have personally seen and evaluated the patient, evaluated laboratory and imaging results, formulated the assessment and plan and placed orders. The Patient requires high complexity decision making for assessment and support.  Case was discussed on Rounds with the Respiratory Therapy Staff  Yevonne Pax, MD [facility] Pulmonary Critical Care Medicine Sleep Medicine

## 2018-09-03 NOTE — Progress Notes (Signed)
Pulmonary Critical Care Medicine [facility] [place]   PULMONARY CRITICAL CARE SERVICE  PROGRESS NOTE  Date of Service: 09/03/2018  Wayne Buckley  OIB:[id number]  DOB: 05/27/1951   DOA: 07/25/2018  Referring Physician: Carron Curie, MD  HPI: Wayne Buckley is a 68 y.o. male seen for follow up of Acute on Chronic Respiratory Failure.  Patient is comfortable right now without distress remains on T collar has been on 40% FiO2 left lung still showing significant atelectasis we will need to have an airway examination done  Medications: Reviewed on Rounds  Physical Exam:  Vitals: Temperature 98.4 pulse 99 respiratory rate 30 blood pressure 137/76 saturation 95%  Ventilator Settings off the ventilator on T collar right now  . General: Comfortable at this time . Eyes: Grossly normal lids, irises & conjunctiva . ENT: grossly tongue is normal . Neck: no obvious mass . Cardiovascular: S1 S2 normal no gallop . Respiratory: Scattered rhonchi expansion is equal . Abdomen: soft . Skin: no rash seen on limited exam . Musculoskeletal: not rigid . Psychiatric:unable to assess . Neurologic: no seizure no involuntary movements         Lab Data:   Basic Metabolic Panel: Recent Labs  Lab 08/28/18 0540 08/28/18 2019 09/02/18 2009  NA  --  133*  --   K 3.7 3.7  --   CL  --  104  --   CO2  --  23  --   GLUCOSE  --  88  --   BUN  --  14  --   CREATININE  --  0.78 0.78  CALCIUM  --  8.0*  --   MG  --  1.7  --     ABG: Recent Labs  Lab 08/28/18 1955  PHART 7.431  PCO2ART 36.1  PO2ART 123*  HCO3 23.2  O2SAT 98.9    Liver Function Tests: No results for input(s): AST, ALT, ALKPHOS, BILITOT, PROT, ALBUMIN in the last 168 hours. No results for input(s): LIPASE, AMYLASE in the last 168 hours. No results for input(s): AMMONIA in the last 168 hours.  CBC: Recent Labs  Lab 08/28/18 2019  WBC 11.1*  HGB 7.9*  HCT 25.6*  MCV 84.5  PLT 806*    Cardiac Enzymes: Recent  Labs  Lab 08/28/18 2019 08/29/18 0623  CKTOTAL 45*  --   CKMB 2.7  --   TROPONINI <0.03 <0.03    BNP (last 3 results) No results for input(s): BNP in the last 8760 hours.  ProBNP (last 3 results) No results for input(s): PROBNP in the last 8760 hours.  Radiological Exams: Dg Chest Port 1 View  Result Date: 09/02/2018 CLINICAL DATA:  Left-sided chest tube. Short of breath. Follow-up exam. EXAM: PORTABLE CHEST 1 VIEW COMPARISON:  Chest CT, 08/31/2018. Chest radiographs, 08/28/2018 and older exams. FINDINGS: There is some aeration of the left mid and upper lung, representing an improvement from the prior CT. Left sided chest tube is stable, lying along the inferior me thorax, curled tip of the tube projecting over the cardiac silhouette. There is opacity at the right lung base consistent with atelectasis, similar to the prior CT. Remainder of the right lung is clear. Right internal jugular central venous line is stable, tip in the right atrium. Tracheostomy is well positioned. IMPRESSION: 1. Mild interval improvement compared to the prior chest CT. A portion of the left mid and upper lung is now aerated. Remainder remains consolidated/atelectatic. 2. No change in the position of the  left chest tube, right internal jugular central venous line are tracheostomy tube. 3. Right lung base opacity consistent with atelectasis similar to the prior CT. 4. No new lung abnormalities. 5. No pneumothorax. Electronically Signed   By: Amie Portland M.D.   On: 09/02/2018 21:59    Assessment/Plan Active Problems:   Acute on chronic respiratory failure with hypoxia (HCC)   Spinal cord injury, cervical region [facility])   TBI (traumatic brain injury) (HCC)   Lobar pneumonia, unspecified organism (HCC)   MRSA bacteremia   1. Acute on chronic respiratory failure with hypoxia continue with T collar right now titrate oxygen as tolerated 2. Spinal cord injury grossly unchanged we will continue with present  management 3. Traumatic brain injury grossly unchanged 4. Lobar pneumonia treated still with residual increased density on the left side 5. MRSA pneumonia bacteremia treated   I have personally seen and evaluated the patient, evaluated laboratory and imaging results, formulated the assessment and plan and placed orders. The Patient requires high complexity decision making for assessment and support.  Case was discussed on Rounds with the Respiratory Therapy Staff  Yevonne Pax, MD [facility] Pulmonary Critical Care Medicine Sleep Medicine

## 2018-09-04 LAB — BLOOD GAS, ARTERIAL
Acid-Base Excess: 3.4 mmol/L — ABNORMAL HIGH (ref 0.0–2.0)
Bicarbonate: 27.4 mmol/L (ref 20.0–28.0)
Drawn by: 55062
FIO2: 60
O2 SAT: 97.8 %
Patient temperature: 97.5
pCO2 arterial: 40.7 mmHg (ref 32.0–48.0)
pH, Arterial: 7.439 (ref 7.350–7.450)
pO2, Arterial: 93.5 mmHg (ref 83.0–108.0)

## 2018-09-04 LAB — MAGNESIUM: Magnesium: 1.9 mg/dL (ref 1.7–2.4)

## 2018-09-04 NOTE — Progress Notes (Addendum)
Pulmonary Critical Care Medicine [facility] [place]   PULMONARY CRITICAL CARE SERVICE  PROGRESS NOTE  Date of Service: 09/04/2018  Wayne Buckley  [id number]:[id number]  DOB: 30-Oct-1950   DOA: 07/25/2018  Referring Physician: Carron Curie, MD  HPI: Wayne Buckley is a 68 y.o. male seen for follow up of Acute on Chronic Respiratory Failure.  Patient had an episode of bradycardia last night he was bagged and lavaged for some time.  Today he is on T collar 35% FiO2 and doing well.  Patient will most likely be bronched tomorrow.  Medications: Reviewed on Rounds  Physical Exam:  Vitals: Pulse 100 respirations 24 BP 149/84 O2 sat 99% temp 97.2  Ventilator Settings not currently on ventilator  . General: Comfortable at this time . Eyes: Grossly normal lids, irises & conjunctiva . ENT: grossly tongue is normal . Neck: no obvious mass . Cardiovascular: S1 S2 normal no gallop . Respiratory: Scattered rhonchi . Abdomen: soft . Skin: no rash seen on limited exam . Musculoskeletal: not rigid . Psychiatric:unable to assess . Neurologic: no seizure no involuntary movements         Lab Data:   Basic Metabolic Panel: Recent Labs  Lab 08/28/18 2019 09/02/18 2009 09/04/18 0944  NA 133*  --   --   K 3.7  --   --   CL 104  --   --   CO2 23  --   --   GLUCOSE 88  --   --   BUN 14  --   --   CREATININE 0.78 0.78  --   CALCIUM 8.0*  --   --   MG 1.7  --  1.9    ABG: Recent Labs  Lab 08/28/18 1955 09/04/18 0525  PHART 7.431 7.439  PCO2ART 36.1 40.7  PO2ART 123* 93.5  HCO3 23.2 27.4  O2SAT 98.9 97.8    Liver Function Tests: No results for input(s): AST, ALT, ALKPHOS, BILITOT, PROT, ALBUMIN in the last 168 hours. No results for input(s): LIPASE, AMYLASE in the last 168 hours. No results for input(s): AMMONIA in the last 168 hours.  CBC: Recent Labs  Lab 08/28/18 2019  WBC 11.1*  HGB 7.9*  HCT 25.6*  MCV 84.5  PLT 806*    Cardiac Enzymes: Recent Labs  Lab  08/28/18 2019 08/29/18 0623  CKTOTAL 45*  --   CKMB 2.7  --   TROPONINI <0.03 <0.03    BNP (last 3 results) No results for input(s): BNP in the last 8760 hours.  ProBNP (last 3 results) No results for input(s): PROBNP in the last 8760 hours.  Radiological Exams: Dg Chest Port 1 View  Result Date: 09/02/2018 CLINICAL DATA:  Left-sided chest tube. Short of breath. Follow-up exam. EXAM: PORTABLE CHEST 1 VIEW COMPARISON:  Chest CT, 08/31/2018. Chest radiographs, 08/28/2018 and older exams. FINDINGS: There is some aeration of the left mid and upper lung, representing an improvement from the prior CT. Left sided chest tube is stable, lying along the inferior me thorax, curled tip of the tube projecting over the cardiac silhouette. There is opacity at the right lung base consistent with atelectasis, similar to the prior CT. Remainder of the right lung is clear. Right internal jugular central venous line is stable, tip in the right atrium. Tracheostomy is well positioned. IMPRESSION: 1. Mild interval improvement compared to the prior chest CT. A portion of the left mid and upper lung is now aerated. Remainder remains consolidated/atelectatic. 2. No change in  the position of the left chest tube, right internal jugular central venous line are tracheostomy tube. 3. Right lung base opacity consistent with atelectasis similar to the prior CT. 4. No new lung abnormalities. 5. No pneumothorax. Electronically Signed   By: Amie Portland M.D.   On: 09/02/2018 21:59    Assessment/Plan Active Problems:   Acute on chronic respiratory failure with hypoxia (HCC)   Spinal cord injury, cervical region [facility])   TBI (traumatic brain injury) (HCC)   Lobar pneumonia, unspecified organism (HCC)   MRSA bacteremia   1. Acute on chronic respiratory failure with hypoxia continue with T collar right now and titrate oxygen as tolerated per protocol. 2. Spinal cord injury grossly unchanged continue present  management 3. Traumatic brain injury grossly unchanged 4. Lobar pneumonia treated with some residual increased density on the left side 5. MRSA pneumonia bacteremia treated   I have personally seen and evaluated the patient, evaluated laboratory and imaging results, formulated the assessment and plan and placed orders. The Patient requires high complexity decision making for assessment and support.  Case was discussed on Rounds with the Respiratory Therapy Staff  Yevonne Pax, MD [facility] Pulmonary Critical Care Medicine Sleep Medicine

## 2018-09-05 ENCOUNTER — Other Ambulatory Visit (HOSPITAL_COMMUNITY): Payer: Self-pay

## 2018-09-05 DIAGNOSIS — J9811 Atelectasis: Secondary | ICD-10-CM

## 2018-09-05 LAB — VANCOMYCIN, TROUGH: Vancomycin Tr: 17 ug/mL (ref 15–20)

## 2018-09-05 NOTE — Progress Notes (Signed)
Pulmonary Critical Care Medicine [facility] [place]   PULMONARY CRITICAL CARE SERVICE  PROGRESS NOTE  Date of Service: 09/05/2018  Wayne Buckley  MBW:[id number]  DOB: 1950-09-18   DOA: 07/25/2018  Referring Physician: Carron Curie, MD  HPI: Wayne Buckley is a 68 y.o. male seen for follow up of Acute on Chronic Respiratory Failure.  Patient remains on T collar has been needing 35% FiO2.  Oxygenation has remained an issue and also secretion retention has also remained an issue.  Patient needs ongoing aggressive pulmonary toilet  Medications: Reviewed on Rounds  Physical Exam:  Vitals: Temperature 97.6 pulse 90 respiratory 21 blood pressure 128/47 saturations 92%  Ventilator Settings of the ventilator currently is on T collar  . General: Comfortable at this time . Eyes: Grossly normal lids, irises & conjunctiva . ENT: grossly tongue is normal . Neck: no obvious mass . Cardiovascular: S1 S2 normal no gallop . Respiratory: Scattered rhonchi expansion is equal at this time . Abdomen: soft . Skin: no rash seen on limited exam . Musculoskeletal: not rigid . Psychiatric:unable to assess . Neurologic: no seizure no involuntary movements         Lab Data:   Basic Metabolic Panel: Recent Labs  Lab 09/02/18 2009 09/04/18 0944  CREATININE 0.78  --   MG  --  1.9    ABG: Recent Labs  Lab 09/04/18 0525  PHART 7.439  PCO2ART 40.7  PO2ART 93.5  HCO3 27.4  O2SAT 97.8    Liver Function Tests: No results for input(s): AST, ALT, ALKPHOS, BILITOT, PROT, ALBUMIN in the last 168 hours. No results for input(s): LIPASE, AMYLASE in the last 168 hours. No results for input(s): AMMONIA in the last 168 hours.  CBC: No results for input(s): WBC, NEUTROABS, HGB, HCT, MCV, PLT in the last 168 hours.  Cardiac Enzymes: No results for input(s): CKTOTAL, CKMB, CKMBINDEX, TROPONINI in the last 168 hours.  BNP (last 3 results) No results for input(s): BNP in the last 8760  hours.  ProBNP (last 3 results) No results for input(s): PROBNP in the last 8760 hours.  Radiological Exams: No results found.  Assessment/Plan Active Problems:   Acute on chronic respiratory failure with hypoxia (HCC)   Spinal cord injury, cervical region [facility])   TBI (traumatic brain injury) (HCC)   Lobar pneumonia, unspecified organism (HCC)   MRSA bacteremia   1. Acute on chronic respiratory failure with hypoxia we will continue with T collar trials continue secretion management pulmonary toilet.  Patient needs ongoing aggressive pulmonary toilet. 2. Spinal cord injury treated we will continue to follow 3. Traumatic brain injury grossly unchanged 4. Lobar pneumonia follow-up bronchoscopy will be done also patient has been having issues with atelectasis likely mucous plugging 5. MRSA bacteremia treated we will continue to follow   I have personally seen and evaluated the patient, evaluated laboratory and imaging results, formulated the assessment and plan and placed orders. The Patient requires high complexity decision making for assessment and support.  Case was discussed on Rounds with the Respiratory Therapy Staff  Yevonne Pax, MD [facility] Pulmonary Critical Care Medicine Sleep Medicine

## 2018-09-05 NOTE — Procedures (Signed)
Date: 09/05/2018,  MRN# [id number]    Procedure Note: Fiberoptic Bronchoscopy   PROCEDURE DATE: 09/05/2018     NAME:  Wayne Buckley   DOB:[birth year]   MRN: [id number] LOC:  [id number]      Indications/Preliminary Diagnosis: Atelectasis of the lung left  Consent: (Place X beside choice/s below)  The benefits, risks and possible complications of the procedure were        explained to:  ___ patient  _X__ patient's family  ___ other:___________  who verbalized understanding and gave:  ___ verbal  ___ written  ___ verbal and written  __X_ telephone  ___ other:________ consent.      Unable to obtain consent; procedure performed on emergent basis.     Other:      PRESEDATION ASSESSMENT: History and Physical has been performed. Patient meds and allergies have been reviewed. Presedation airway examination has been performed and documented. Baseline vital signs, sedation score, oxygenation status, and cardiac rhythm were reviewed. Patient was deemed to be in satisfactory condition to undergo the procedure.  PREMEDICATIONS:   Sedative/Narcotic Amt Dose   Versed 2 mg   Fentanyl 50 mcg  Diprivan  mg         Insertion Route (Place X beside choice below)   Nasal   Oral   Endotracheal Tube  X Tracheostomy   INTRAPROCEDURE MEDICATIONS:  Sedative/Narcotic Amt Dose   Versed  mg   Fentanyl  mcg  Diprivan  mg       Medication Amt Dose  Medication Amt Dose  Xylocaine 2%  cc  Epinephrine 1:10,000 sol  cc  Xylocaine 4%  cc  Cocaine  cc   TECHNICAL PROCEDURES: (Place X beside choice below)   Procedures  Description  X  None     Electrocautery     Cryotherapy     Balloon Dilatation     Bronchography     Stent Placement     Therapeutic Aspiration     Laser/Argon Plasma            SPECIMENS (Sites): (Place X beside choice below)  Specimens Description   No Specimens Obtained     Washings   X Lavage  bronchoalveolar lavage performed from the left lower lobe   Biopsies    Fine Needle Aspirates    Brushings    Sputum    FINDINGS:  ESTIMATED BLOOD LOSS: none  COMPLICATIONS/RESOLUTION: none  PROCEDURE DETAILS: Timeout performed and correct patient, name, & ID confirmed. Following prep per Pulmonary policy, appropriate sedation was administered.  Airway exam proceeded with findings, technical procedures, and specimen collection as noted below. At the end of exam the scope was withdrawn without incident. Impression and Plan as noted below.    Procedure Note: After informed consent was obtained patient was given 2 mg of Versed and 50 mcg of fentanyl.  Next the fiberoptic scope was inserted through the tracheostomy tube down to the carina.  Almost immediately we noted that there was a large amount of mucus coming from the left mainstem bronchus.  The mucus was thick tenacious.  Fiberoptic scope was inserted and suctioned out a good portion of the mucus from the mainstem bronchus.  Next the fiberoptic scope was inserted into the left upper lobe lingula and lower lobe.  The lower lobe was also full of secretions.  Mucomyst was instilled and then suctioned out.  Bronchoalveolar lavage was performed from the left lower lobe with good return.  Patient had thick copious secretions and the  specimen was sent to the lab for diagnosis including cultures.    IMPRESSION:POST-PROCEDURE DX: Mucous plugging left mainstem bronchus and left lower lobe   RECOMMENDATION/PLAN: Aggressive pulmonary toilet Mucomyst as needed follow-up chest x-ray  I have personally performed the procedure as noted above     Allyne Gee, MD [facility] Pulmonary Critical Care Medicine

## 2018-09-06 DIAGNOSIS — J90 Pleural effusion, not elsewhere classified: Secondary | ICD-10-CM

## 2018-09-06 NOTE — Progress Notes (Signed)
Pulmonary Critical Care Medicine [facility] [place]   PULMONARY CRITICAL CARE SERVICE  PROGRESS NOTE  Date of Service: 09/06/2018  Wayne Buckley  OQH:[id number]  DOB: Dec 30, 1950   DOA: 07/25/2018  Referring Physician: Carron Curie, MD  HPI: Wayne Buckley is a 68 y.o. male seen for follow up of Acute on Chronic Respiratory Failure.  Patient despite having a bronchoscopy yesterday developed atelectasis again on the left side.  He has been getting Mucomyst and has reportedly been getting aggressive suctioning..  Still has a chest tube in place which when clamped appears to accumulate fluid  Medications: Reviewed on Rounds  Physical Exam:  Vitals: Temperature 97.2 pulse 99 respiratory rate 18 blood pressure 106/61 saturations 95%  Ventilator Settings off ventilator on T collar at night  . General: Comfortable at this time . Eyes: Grossly normal lids, irises & conjunctiva . ENT: grossly tongue is normal . Neck: no obvious mass . Cardiovascular: S1 S2 normal no gallop . Respiratory: No rhonchi or rales are noted . Abdomen: soft . Skin: no rash seen on limited exam . Musculoskeletal: not rigid . Psychiatric:unable to assess . Neurologic: no seizure no involuntary movements         Lab Data:   Basic Metabolic Panel: Recent Labs  Lab 09/02/18 2009 09/04/18 0944  CREATININE 0.78  --   MG  --  1.9    ABG: Recent Labs  Lab 09/04/18 0525  PHART 7.439  PCO2ART 40.7  PO2ART 93.5  HCO3 27.4  O2SAT 97.8    Liver Function Tests: No results for input(s): AST, ALT, ALKPHOS, BILITOT, PROT, ALBUMIN in the last 168 hours. No results for input(s): LIPASE, AMYLASE in the last 168 hours. No results for input(s): AMMONIA in the last 168 hours.  CBC: No results for input(s): WBC, NEUTROABS, HGB, HCT, MCV, PLT in the last 168 hours.  Cardiac Enzymes: No results for input(s): CKTOTAL, CKMB, CKMBINDEX, TROPONINI in the last 168 hours.  BNP (last 3 results) No results for  input(s): BNP in the last 8760 hours.  ProBNP (last 3 results) No results for input(s): PROBNP in the last 8760 hours.  Radiological Exams: Dg Chest Port 1 View  Result Date: 09/05/2018 CLINICAL DATA:  Status post left lung bronchoscopy and lavage EXAM: PORTABLE CHEST 1 VIEW COMPARISON:  09/02/2018 FINDINGS: There remains poor aeration of the left lung which is almost entirely atelectatic. No pneumothorax. Pigtail thoracostomy tube remains in the left pleural space. Tracheostomy remains. The right lung is clear. Densely calcified lymph nodes again identified in the left hilum and subcarinal region. IMPRESSION: Stable near complete atelectasis of the left lung with poor aeration. No pneumothorax visible after bronchoscopy. Electronically Signed   By: Irish Lack M.D.   On: 09/05/2018 13:18    Assessment/Plan Active Problems:   Acute on chronic respiratory failure with hypoxia (HCC)   Spinal cord injury, cervical region [facility])   TBI (traumatic brain injury) (HCC)   Lobar pneumonia, unspecified organism (HCC)   MRSA bacteremia   1. Acute on chronic respiratory failure with hypoxia patient is on T collar despite the bronchoscopy yesterday still has got atelectasis on the left side.  Spoke with the wife and the daughter was in the room had an extensive discussion regarding the problems that are facing [name] including the inability to handle secretions as an explanation as to why he continues to develop atelectasis.  Patient preliminary cultures from the bronchoscopy were showing staph aureus and he does have a  history of MRSA bacteremia.  I discussed with the primary care team regarding antibiotic selection.  He is also not a surgical candidate for any intervention as far as the chest is concerned 2. Spinal cord injury unchanged we will continue with supportive care 3. Traumatic brain and brain injury patient is at baseline 4. Lobar pneumonia has been treated still has atelectasis of the left  lung despite multiple rounds of antibiotics so it is hard to discern whether there still is any underlying pneumonitis. 5. MRSA bacteremia treated we will continue with supportive care   I have personally seen and evaluated the patient, evaluated laboratory and imaging results, formulated the assessment and plan and placed orders.  Time 35 minutes in discussion with the family and primary care team The Patient requires high complexity decision making for assessment and support.  Case was discussed on Rounds with the Respiratory Therapy Staff  Yevonne Pax, MD [facility] Pulmonary Critical Care Medicine Sleep Medicine

## 2018-09-07 LAB — CULTURE, BAL-QUANTITATIVE W GRAM STAIN: Culture: >=100000 — AB

## 2018-09-07 LAB — CULTURE, BAL-QUANTITATIVE

## 2018-09-07 NOTE — Progress Notes (Signed)
Pulmonary Critical Care Medicine [facility] [place]   PULMONARY CRITICAL CARE SERVICE  PROGRESS NOTE  Date of Service: 09/07/2018  Wayne Buckley  [id number]  DOB: 10/19/1950   DOA: 07/25/2018  Referring Physician: Carron Curie, MD  HPI: Wayne Buckley is a 68 y.o. male seen for follow up of Acute on Chronic Respiratory Failure.  Remains on T collar now 28% FiO2 started on hypertonic saline seems to be doing a little bit better  Medications: Reviewed on Rounds  Physical Exam:  Vitals: Temperature 98.0 pulse 90 respiratory 22 blood pressure 108/65 saturation 96%  Ventilator Settings on T collar 28% FiO2  . General: Comfortable at this time . Eyes: Grossly normal lids, irises & conjunctiva . ENT: grossly tongue is normal . Neck: no obvious mass . Cardiovascular: S1 S2 normal no gallop . Respiratory: No rhonchi or rales are noted at this time . Abdomen: soft . Skin: no rash seen on limited exam . Musculoskeletal: not rigid . Psychiatric:unable to assess . Neurologic: no seizure no involuntary movements         Lab Data:   Basic Metabolic Panel: Recent Labs  Lab 09/02/18 2009 09/04/18 0944  CREATININE 0.78  --   MG  --  1.9    ABG: Recent Labs  Lab 09/04/18 0525  PHART 7.439  PCO2ART 40.7  PO2ART 93.5  HCO3 27.4  O2SAT 97.8    Liver Function Tests: No results for input(s): AST, ALT, ALKPHOS, BILITOT, PROT, ALBUMIN in the last 168 hours. No results for input(s): LIPASE, AMYLASE in the last 168 hours. No results for input(s): AMMONIA in the last 168 hours.  CBC: No results for input(s): WBC, NEUTROABS, HGB, HCT, MCV, PLT in the last 168 hours.  Cardiac Enzymes: No results for input(s): CKTOTAL, CKMB, CKMBINDEX, TROPONINI in the last 168 hours.  BNP (last 3 results) No results for input(s): BNP in the last 8760 hours.  ProBNP (last 3 results) No results for input(s): PROBNP in the last 8760 hours.  Radiological Exams: No results  found.  Assessment/Plan Active Problems:   Acute on chronic respiratory failure with hypoxia (HCC)   Spinal cord injury, cervical region [facility])   TBI (traumatic brain injury) (HCC)   Lobar pneumonia, unspecified organism (HCC)   MRSA bacteremia   1. Acute on chronic respiratory failure with hypoxia we will continue with the T collar weaning as tolerated continue aggressive pulmonary toilet continue hypertonic saline 2. Spinal cord injury unchanged we will continue with present management 3. Traumatic brain injury at baseline 4. Lobar pneumonia treated 5. MRSA bacteremia resolved   I have personally seen and evaluated the patient, evaluated laboratory and imaging results, formulated the assessment and plan and placed orders. The Patient requires high complexity decision making for assessment and support.  Case was discussed on Rounds with the Respiratory Therapy Staff  Yevonne Pax, MD [facility] Pulmonary Critical Care Medicine Sleep Medicine

## 2018-09-08 NOTE — Progress Notes (Signed)
Pulmonary Critical Care Medicine [facility] [place]   PULMONARY CRITICAL CARE SERVICE  PROGRESS NOTE  Date of Service: 09/08/2018  Wayne Buckley  [id number]  DOB: Dec 16, 1950   DOA: 07/25/2018  Referring Physician: Carron Curie, MD  HPI: Wayne Buckley is a 68 y.o. male seen for follow up of Acute on Chronic Respiratory Failure.  Patient is currently on T collar has been on 40% FiO2's copious  Medications: Reviewed on Rounds  Physical Exam:  Vitals: Pressure 98.2 pulse 96 respiratory rate 23 blood pressure 120/76 saturations 91%  Ventilator Settings off ventilator on T collar  . General: Comfortable at this time . Eyes: Grossly normal lids, irises & conjunctiva . ENT: grossly tongue is normal . Neck: no obvious mass . Cardiovascular: S1 S2 normal no gallop . Respiratory: No rhonchi or rales are noted at this time . Abdomen: soft . Skin: no rash seen on limited exam . Musculoskeletal: not rigid . Psychiatric:unable to assess . Neurologic: no seizure no involuntary movements         Lab Data:   Basic Metabolic Panel: Recent Labs  Lab 09/02/18 2009 09/04/18 0944  CREATININE 0.78  --   MG  --  1.9    ABG: Recent Labs  Lab 09/04/18 0525  PHART 7.439  PCO2ART 40.7  PO2ART 93.5  HCO3 27.4  O2SAT 97.8    Liver Function Tests: No results for input(s): AST, ALT, ALKPHOS, BILITOT, PROT, ALBUMIN in the last 168 hours. No results for input(s): LIPASE, AMYLASE in the last 168 hours. No results for input(s): AMMONIA in the last 168 hours.  CBC: No results for input(s): WBC, NEUTROABS, HGB, HCT, MCV, PLT in the last 168 hours.  Cardiac Enzymes: No results for input(s): CKTOTAL, CKMB, CKMBINDEX, TROPONINI in the last 168 hours.  BNP (last 3 results) No results for input(s): BNP in the last 8760 hours.  ProBNP (last 3 results) No results for input(s): PROBNP in the last 8760 hours.  Radiological Exams: No results found.  Assessment/Plan Active  Problems:   Acute on chronic respiratory failure with hypoxia (HCC)   Spinal cord injury, cervical region [facility])   TBI (traumatic brain injury) (HCC)   Lobar pneumonia, unspecified organism (HCC)   MRSA bacteremia   1. Acute on chronic respiratory failure with hypoxia continue with T collar continue secretion management pulmonary toilet. 2. Spinal cord injury cervical region continue with present therapy 3. Traumatic brain injury unchanged 4. Lobar pneumonia treated with residual pulmonary damage 5. MRSA treatment treated we will continue with supportive care   I have personally seen and evaluated the patient, evaluated laboratory and imaging results, formulated the assessment and plan and placed orders. The Patient requires high complexity decision making for assessment and support.  Case was discussed on Rounds with the Respiratory Therapy Staff  Yevonne Pax, MD [facility] Pulmonary Critical Care Medicine Sleep Medicine

## 2018-09-09 ENCOUNTER — Other Ambulatory Visit (HOSPITAL_COMMUNITY): Payer: Self-pay

## 2018-09-09 NOTE — Progress Notes (Addendum)
Pulmonary Critical Care Medicine [facility] [place]   PULMONARY CRITICAL CARE SERVICE  PROGRESS NOTE  Date of Service: 09/09/2018  JOSEJUAN STAGNARO  FMB:[id number]  DOB: Jun 14, 1951   DOA: 07/25/2018  Referring Physician: Carron Curie, MD  HPI: KYLYNN SWARTZENTRUBER is a 68 y.o. male seen for follow up of Acute on Chronic Respiratory Failure.  Patient remains on T collar at this time requiring 35% FiO2.  Minimal secretions noted in no acute distress at this time.  Medications: Reviewed on Rounds  Physical Exam:  Vitals: Pulse 101 patient is 26 BP 115/74 O2 sat 90% temp 98.7  Ventilator Settings patient not currently on ventilator  . General: Comfortable at this time . Eyes: Grossly normal lids, irises & conjunctiva . ENT: grossly tongue is normal . Neck: no obvious mass . Cardiovascular: S1 S2 normal no gallop . Respiratory: No rales or rhonchi noted . Abdomen: soft . Skin: no rash seen on limited exam . Musculoskeletal: not rigid . Psychiatric:unable to assess . Neurologic: no seizure no involuntary movements         Lab Data:   Basic Metabolic Panel: Recent Labs  Lab 09/02/18 2009 09/04/18 0944  CREATININE 0.78  --   MG  --  1.9    ABG: Recent Labs  Lab 09/04/18 0525  PHART 7.439  PCO2ART 40.7  PO2ART 93.5  HCO3 27.4  O2SAT 97.8    Liver Function Tests: No results for input(s): AST, ALT, ALKPHOS, BILITOT, PROT, ALBUMIN in the last 168 hours. No results for input(s): LIPASE, AMYLASE in the last 168 hours. No results for input(s): AMMONIA in the last 168 hours.  CBC: No results for input(s): WBC, NEUTROABS, HGB, HCT, MCV, PLT in the last 168 hours.  Cardiac Enzymes: No results for input(s): CKTOTAL, CKMB, CKMBINDEX, TROPONINI in the last 168 hours.  BNP (last 3 results) No results for input(s): BNP in the last 8760 hours.  ProBNP (last 3 results) No results for input(s): PROBNP in the last 8760 hours.  Radiological Exams: Dg Chest Port 1  View  Result Date: 09/09/2018 CLINICAL DATA:  Atelectasis. EXAM: PORTABLE CHEST 1 VIEW COMPARISON:  Radiographs of September 05, 2018. FINDINGS: Stable near complete opacification of left hemithorax is noted most consistent with pleural effusion. Stable position of left-sided pigtail catheter is noted. No pneumothorax is noted. Minimal right basilar subsegmental atelectasis is noted. Tracheostomy tube is unchanged in position. Right internal jugular catheter is unchanged. Bony thorax is unremarkable. IMPRESSION: Stable near complete opacification of left hemithorax is noted consistent with pleural effusion and associated atelectasis. Stable position of left sided pigtail drainage catheter. Electronically Signed   By: Lupita Raider, M.D.   On: 09/09/2018 11:57    Assessment/Plan Active Problems:   Acute on chronic respiratory failure with hypoxia (HCC)   Spinal cord injury, cervical region [facility]   TBI (traumatic brain injury) (HCC)   Lobar pneumonia, unspecified organism (HCC)   MRSA bacteremia   1. Acute on chronic respiratory failure with hypoxia continue with T collar trials and secretion management at this time.  Patient is currently on 35% FiO2.  Continue supportive measures. 2. Spinal cord injury cervical region continue with present therapy 3. Traumatic brain injury unchanged 4. Lobar pneumonia treated with residual pulmonary damage 5. MRSA treatment continue supportive care   I have personally seen and evaluated the patient, evaluated laboratory and imaging results, formulated the assessment and plan and placed orders. The Patient requires high complexity decision making for assessment and  support.  Case was discussed on Rounds with the Respiratory Therapy Staff  Allyne Gee, MD [facility] Pulmonary Critical Care Medicine Sleep Medicine

## 2018-09-10 ENCOUNTER — Other Ambulatory Visit (HOSPITAL_COMMUNITY): Payer: Self-pay

## 2018-09-10 LAB — CREATININE, SERUM
Creatinine, Ser: 1.06 mg/dL (ref 0.61–1.24)
GFR calc non Af Amer: >60 mL/min (ref >60)

## 2018-09-10 LAB — VANCOMYCIN, TROUGH: Vancomycin Tr: 28 ug/mL (ref 15–20)

## 2018-09-10 NOTE — Progress Notes (Addendum)
Pulmonary Critical Care Medicine [address] [facility] [place]   PULMONARY CRITICAL CARE SERVICE  PROGRESS NOTE  Date of Service: 09/10/2018  Wayne Buckley  EQA:[id number]  DOB: 10-04-1950   DOA: 07/25/2018  Referring Physician: Carron Curie, MD  HPI: Wayne Buckley is a 68 y.o. male seen for follow up of Acute on Chronic Respiratory Failure.  Patient remains on T collar at this time requiring 35% FiO2.  Minimal secretions noted in no acute distress at this time.  Medications: Reviewed on Rounds  Physical Exam:  Vitals: Pulse 107 respirations 23 BP 122/61 O2 sat 94% temp 97.1  Ventilator Settings patient's not currently on ventilator  . General: Comfortable at this time . Eyes: Grossly normal lids, irises & conjunctiva . ENT: grossly tongue is normal . Neck: no obvious mass . Cardiovascular: S1 S2 normal no gallop . Respiratory: No rales or rhonchi noted . Abdomen: soft . Skin: no rash seen on limited exam . Musculoskeletal: not rigid . Psychiatric:unable to assess . Neurologic: no seizure no involuntary movements         Lab Data:   Basic Metabolic Panel: Recent Labs  Lab 09/04/18 0944 09/10/18 1210  CREATININE  --  1.06  MG 1.9  --     ABG: Recent Labs  Lab 09/04/18 0525  PHART 7.439  PCO2ART 40.7  PO2ART 93.5  HCO3 27.4  O2SAT 97.8    Liver Function Tests: No results for input(s): AST, ALT, ALKPHOS, BILITOT, PROT, ALBUMIN in the last 168 hours. No results for input(s): LIPASE, AMYLASE in the last 168 hours. No results for input(s): AMMONIA in the last 168 hours.  CBC: No results for input(s): WBC, NEUTROABS, HGB, HCT, MCV, PLT in the last 168 hours.  Cardiac Enzymes: No results for input(s): CKTOTAL, CKMB, CKMBINDEX, TROPONINI in the last 168 hours.  BNP (last 3 results) No results for input(s): BNP in the last 8760 hours.  ProBNP (last 3 results) No results for input(s): PROBNP in the last 8760 hours.  Radiological Exams: Dg Chest Port 1  View  Result Date: 09/09/2018 CLINICAL DATA:  Atelectasis. EXAM: PORTABLE CHEST 1 VIEW COMPARISON:  Radiographs of September 05, 2018. FINDINGS: Stable near complete opacification of left hemithorax is noted most consistent with pleural effusion. Stable position of left-sided pigtail catheter is noted. No pneumothorax is noted. Minimal right basilar subsegmental atelectasis is noted. Tracheostomy tube is unchanged in position. Right internal jugular catheter is unchanged. Bony thorax is unremarkable. IMPRESSION: Stable near complete opacification of left hemithorax is noted consistent with pleural effusion and associated atelectasis. Stable position of left sided pigtail drainage catheter. Electronically Signed   By: Lupita Raider, M.D.   On: 09/09/2018 11:57    Assessment/Plan Active Problems:   Acute on chronic respiratory failure with hypoxia (HCC)   Spinal cord injury, cervical region [facility])   TBI (traumatic brain injury) (HCC)   Lobar pneumonia, unspecified organism (HCC)   MRSA bacteremia   1. Acute on chronic respiratory failure with hypoxia continue with T collar trials and secretion management at this time.  Patient is currently on 35% FiO2 continue support measures. 2. Spinal cord injury cervical region, continue with present therapy 3. Traumatic brain injury unchanged 4. Lobar pneumonia treated with residual pulmonary damage 5. MRSA treatment continue support care   I have personally seen and evaluated the patient, evaluated laboratory and imaging results, formulated the assessment and plan and placed orders. The Patient requires high complexity decision making for assessment and support.  Case was discussed on Rounds with the Respiratory Therapy Staff  Allyne Gee, MD [facility] Pulmonary Critical Care Medicine Sleep Medicine

## 2018-09-11 LAB — VANCOMYCIN, TROUGH: VANCOMYCIN TR: 19 ug/mL (ref 15–20)

## 2018-09-11 NOTE — Progress Notes (Addendum)
Pulmonary Critical Care Medicine [facility] [place]   PULMONARY CRITICAL CARE SERVICE  PROGRESS NOTE  Date of Service: 09/11/2018  Wayne Buckley  UVO:[id number]  DOB: 02-25-1951   DOA: 07/25/2018  Referring Physician: Carron Curie, MD  HPI: Wayne Buckley is a 68 y.o. male seen for follow up of Acute on Chronic Respiratory Failure.  Patient had an episode at approximately 6 PM where he nearly coded.  Again overnight at some point he had another episode he had decreased saturations and heart rate that bradycardia down into the 30s.  He is currently on T collar 40% and doing well.  Medications: Reviewed on Rounds  Physical Exam:  Vitals: Pulse 92 respirations 21 BP 107/89 O2 sat 97% temp 98.2  Ventilator Settings not currently on ventilator  . General: Comfortable at this time . Eyes: Grossly normal lids, irises & conjunctiva . ENT: grossly tongue is normal . Neck: no obvious mass . Cardiovascular: S1 S2 normal no gallop . Respiratory: No rales or rhonchi noted . Abdomen: soft . Skin: no rash seen on limited exam . Musculoskeletal: not rigid . Psychiatric:unable to assess . Neurologic: no seizure no involuntary movements         Lab Data:   Basic Metabolic Panel: Recent Labs  Lab 09/10/18 1210  CREATININE 1.06    ABG: No results for input(s): PHART, PCO2ART, PO2ART, HCO3, O2SAT in the last 168 hours.  Liver Function Tests: No results for input(s): AST, ALT, ALKPHOS, BILITOT, PROT, ALBUMIN in the last 168 hours. No results for input(s): LIPASE, AMYLASE in the last 168 hours. No results for input(s): AMMONIA in the last 168 hours.  CBC: No results for input(s): WBC, NEUTROABS, HGB, HCT, MCV, PLT in the last 168 hours.  Cardiac Enzymes: No results for input(s): CKTOTAL, CKMB, CKMBINDEX, TROPONINI in the last 168 hours.  BNP (last 3 results) No results for input(s): BNP in the last 8760 hours.  ProBNP (last 3 results) No results for input(s): PROBNP in  the last 8760 hours.  Radiological Exams: Dg Chest Port 1 View  Result Date: 09/10/2018 CLINICAL DATA:  68 year old male respiratory distress. EXAM: PORTABLE CHEST 1 VIEW COMPARISON:  09/09/2018 and earlier. FINDINGS: Portable AP semi upright view at 1821 hours. Stable tracheostomy tube. A new large caliber tube projects over the medial left chest and mediastinum of unclear significance. The pigtail type left chest tube is in place but is kinked now (arrow). Mildly to moderately improved ventilation of the left lung since yesterday with residual left basilar opacity. No left pneumothorax identified. Coarsely calcified mediastinal and left hilar lymph nodes again noted. Right lung appears clear allowing for portable technique. IMPRESSION: 1. Pigtail type left chest tube is kinked now (arrow). But left lung ventilation has improved since yesterday with no pneumothorax identified. 2. Right lung remains negative. 3. New larger caliber tube projecting over the medial left chest and mediastinum of unclear significance. Electronically Signed   By: Odessa Fleming M.D.   On: 09/10/2018 19:33    Assessment/Plan Active Problems:   Acute on chronic respiratory failure with hypoxia (HCC)   Spinal cord injury, cervical region [facility])   TBI (traumatic brain injury) (HCC)   Lobar pneumonia, unspecified organism (HCC)   MRSA bacteremia   1. Acute on chronic respiratory failure with hypoxia continue with T collar trials and secretion management at this time.  Patient remains on 40% FiO2 via trach collar. 2. Spinal cord injury cervical region continue with present therapy 3. Traumatic brain  injury unchanged 4. Lobar pneumonia treated with residual pulmonary damage 5. MRSA treatment continue supportive care   I have personally seen and evaluated the patient, evaluated laboratory and imaging results, formulated the assessment and plan and placed orders. The Patient requires high complexity decision making for assessment  and support.  Case was discussed on Rounds with the Respiratory Therapy Staff  Yevonne Pax, MD [facility] Pulmonary Critical Care Medicine Sleep Medicine

## 2018-09-12 NOTE — Progress Notes (Addendum)
Pulmonary Critical Care Medicine [facility] [place]   PULMONARY CRITICAL CARE SERVICE  PROGRESS NOTE  Date of Service: 09/12/2018  Wayne Buckley  ZOX:[id number]  DOB: 10-04-1950   DOA: 07/25/2018  Referring Physician: Carron Curie, MD  HPI: Wayne Buckley is a 68 y.o. male seen for follow up of Acute on Chronic Respiratory Failure.  Patient is slightly improved today on T collar 40% FiO2.  No acute distress noted at this time.  Medications: Reviewed on Rounds  Physical Exam:  Vitals: Pulse 91 respirations 20 BP 124/63 O2 sat 97% temp 98.2  Ventilator Settings not currently on ventilator  . General: Comfortable at this time . Eyes: Grossly normal lids, irises & conjunctiva . ENT: grossly tongue is normal . Neck: no obvious mass . Cardiovascular: S1 S2 normal no gallop . Respiratory: No rales or rhonchi noted . Abdomen: soft . Skin: no rash seen on limited exam . Musculoskeletal: not rigid . Psychiatric:unable to assess . Neurologic: no seizure no involuntary movements         Lab Data:   Basic Metabolic Panel: Recent Labs  Lab 09/10/18 1210  CREATININE 1.06    ABG: No results for input(s): PHART, PCO2ART, PO2ART, HCO3, O2SAT in the last 168 hours.  Liver Function Tests: No results for input(s): AST, ALT, ALKPHOS, BILITOT, PROT, ALBUMIN in the last 168 hours. No results for input(s): LIPASE, AMYLASE in the last 168 hours. No results for input(s): AMMONIA in the last 168 hours.  CBC: No results for input(s): WBC, NEUTROABS, HGB, HCT, MCV, PLT in the last 168 hours.  Cardiac Enzymes: No results for input(s): CKTOTAL, CKMB, CKMBINDEX, TROPONINI in the last 168 hours.  BNP (last 3 results) No results for input(s): BNP in the last 8760 hours.  ProBNP (last 3 results) No results for input(s): PROBNP in the last 8760 hours.  Radiological Exams: Dg Chest Port 1 View  Result Date: 09/10/2018 CLINICAL DATA:  68 year old male respiratory distress. EXAM:  PORTABLE CHEST 1 VIEW COMPARISON:  09/09/2018 and earlier. FINDINGS: Portable AP semi upright view at 1821 hours. Stable tracheostomy tube. A new large caliber tube projects over the medial left chest and mediastinum of unclear significance. The pigtail type left chest tube is in place but is kinked now (arrow). Mildly to moderately improved ventilation of the left lung since yesterday with residual left basilar opacity. No left pneumothorax identified. Coarsely calcified mediastinal and left hilar lymph nodes again noted. Right lung appears clear allowing for portable technique. IMPRESSION: 1. Pigtail type left chest tube is kinked now (arrow). But left lung ventilation has improved since yesterday with no pneumothorax identified. 2. Right lung remains negative. 3. New larger caliber tube projecting over the medial left chest and mediastinum of unclear significance. Electronically Signed   By: Odessa Fleming M.D.   On: 09/10/2018 19:33    Assessment/Plan Active Problems:   Acute on chronic respiratory failure with hypoxia (HCC)   Spinal cord injury, cervical region [facility])   TBI (traumatic brain injury) (HCC)   Lobar pneumonia, unspecified organism (HCC)   MRSA bacteremia   1. Acute on chronic respiratory failure with hypoxia continue with T collar trials and secretion management at this time.  Continues to require 47 FiO2 wean as tolerated.  Continue pulmonary toilet and secretion management. 2. Spinal cord injury cervical region continue with therapy 3. Traumatic brain injury unchanged 4. Lobar pneumonia treated with residual pulmonary damage 5. MRSA treatment continue supportive care   I have personally seen  and evaluated the patient, evaluated laboratory and imaging results, formulated the assessment and plan and placed orders. The Patient requires high complexity decision making for assessment and support.  Case was discussed on Rounds with the Respiratory Therapy Staff  Yevonne Pax, MD  [facility] Pulmonary Critical Care Medicine Sleep Medicine

## 2018-09-13 ENCOUNTER — Other Ambulatory Visit (HOSPITAL_COMMUNITY): Payer: Self-pay

## 2018-09-13 LAB — BASIC METABOLIC PANEL
Anion gap: 9 (ref 5–15)
BUN: 25 mg/dL — AB (ref 8–23)
CO2: 25 mmol/L (ref 22–32)
Calcium: 8.8 mg/dL — ABNORMAL LOW (ref 8.9–10.3)
Chloride: 101 mmol/L (ref 98–111)
Creatinine, Ser: 0.74 mg/dL (ref 0.61–1.24)
GFR calc Af Amer: >60 mL/min (ref >60)
GFR calc non Af Amer: >60 mL/min (ref >60)
GLUCOSE: 97 mg/dL (ref 70–99)
Potassium: 3.7 mmol/L (ref 3.5–5.1)
Sodium: 135 mmol/L (ref 135–145)

## 2018-09-13 LAB — CBC
HCT: 25.7 % — ABNORMAL LOW (ref 39.0–52.0)
Hemoglobin: 8 g/dL — ABNORMAL LOW (ref 13.0–17.0)
MCH: 26.8 pg (ref 26.0–34.0)
MCHC: 31.1 g/dL (ref 30.0–36.0)
MCV: 86 fL (ref 80.0–100.0)
Platelets: 636 10*3/uL — ABNORMAL HIGH (ref 150–400)
RBC: 2.99 MIL/uL — ABNORMAL LOW (ref 4.22–5.81)
RDW: 16 % — ABNORMAL HIGH (ref 11.5–15.5)
WBC: 10.1 10*3/uL (ref 4.0–10.5)
nRBC: 0.2 % (ref 0.0–0.2)

## 2018-09-13 LAB — MAGNESIUM: Magnesium: 1.9 mg/dL (ref 1.7–2.4)

## 2018-09-13 NOTE — Progress Notes (Addendum)
Patient ID: Wayne Buckley, male   DOB: [birth year], 68 y.o.   MRN: [id number]   Request for evaluation of left chest tube  Chest tube placed in IR 07/08/18/ pleurvac-- secondary complex L pleural effusion Transferred to [facility] 07/25/18 Chest tube has been connected to bag since in [facility] for days now per RN  Most recent CXR 3/24: IMPRESSION: 1. Pigtail type left chest tube is kinked now (arrow). But left lung ventilation has improved since yesterday with no pneumothorax identified. 2. Right lung remains negative. 3. New larger caliber tube projecting over the medial left chest and mediastinum of unclear significance.  Reviewed 3/24 imaging with Dr Fredia Sorrow Effusion almost resolved at this point Rec: CXR now-- if effusion resolved may be able to remove chest tube Or May need CT for further evaluation  Will await CXR to make decision  RN aware

## 2018-09-13 NOTE — Progress Notes (Signed)
Patient ID: Wayne Buckley, male   DOB: July 27, 1950, 69 y.o.   MRN: [id number]  CXR just now:  IMPRESSION: Persisting opacity on the left, compatible with pleural effusion, atelectasis/consolidation, and/or edema. Increasing airspace opacity of the right middle lobe, potentially atelectasis or consolidation. Repositioning of the left-sided pigtail thoracostomy tube, with resolution of the kink. Unchanged right IJ catheter, tracheostomy.  Reviewed imaging with Wayne Buckley Effusion is actually larger today Wayne Buckley is gone  Rec: place back to Pleurvac -20 wall suction We will keep on IR radar  Spoke to Wayne Buckley Agrees to plan

## 2018-09-13 NOTE — Progress Notes (Addendum)
Pulmonary Critical Care Medicine [facility] [place]   PULMONARY CRITICAL CARE SERVICE  PROGRESS NOTE  Date of Service: 09/13/2018  Wayne Buckley  Wayne Buckley  DOB: Wayne Buckley   DOA: 07/25/2018  Referring Physician: Carron Curie, MD  HPI: Wayne Buckley is a 68 y.o. male seen for follow up of Acute on Chronic Respiratory Failure.  Patient remains on T collar at 40% FiO2 time.  He continues to require frequent suctioning for secretions.  He is receiving duo nebs and hypertonic saline at this time.  Medications: Reviewed on Rounds  Physical Exam:  Vitals: Pulse 102 respirations 22 BP 112/73 O2 sat 97% temp 98.5  Ventilator Settings patient not currently on ventilator  . General: Comfortable at this time . Eyes: Grossly normal lids, irises & conjunctiva . ENT: grossly tongue is normal . Neck: no obvious mass . Cardiovascular: S1 S2 normal no gallop . Respiratory: No rales or rhonchi noted . Abdomen: soft . Skin: no rash seen on limited exam . Musculoskeletal: not rigid . Psychiatric:unable to assess . Neurologic: no seizure no involuntary movements         Lab Data:   Basic Metabolic Panel: Recent Labs  Lab 09/10/18 1210 09/13/18 0347  NA  --  135  K  --  3.7  CL  --  101  CO2  --  25  GLUCOSE  --  97  BUN  --  25*  CREATININE 1.06 0.74  CALCIUM  --  8.8*  MG  --  1.9    ABG: No results for input(s): PHART, PCO2ART, PO2ART, HCO3, O2SAT in the last 168 hours.  Liver Function Tests: No results for input(s): AST, ALT, ALKPHOS, BILITOT, PROT, ALBUMIN in the last 168 hours. No results for input(s): LIPASE, AMYLASE in the last 168 hours. No results for input(s): AMMONIA in the last 168 hours.  CBC: Recent Labs  Lab 09/13/18 0347  WBC 10.1  HGB 8.0*  HCT 25.7*  MCV 86.0  PLT 636*    Cardiac Enzymes: No results for input(s): CKTOTAL, CKMB, CKMBINDEX, TROPONINI in the last 168 hours.  BNP (last 3 results) No results for input(s): BNP in the last  8760 hours.  ProBNP (last 3 results) No results for input(s): PROBNP in the last 8760 hours.  Radiological Exams: Dg Chest Port 1 View  Result Date: 09/13/2018 CLINICAL DATA:  68 year old male with a history of left-sided pleural effusion EXAM: PORTABLE CHEST 1 VIEW COMPARISON:  09/10/2018, 09/09/2018 FINDINGS: Cardiomediastinal silhouette likely unchanged though partially obscured by overlying lung/pleural disease. Left heart border is obscured. Unchanged tracheostomy and surgical changes of the cervical region. Unchanged right IJ central venous catheter. There has been repositioning of the left thoracostomy pigtail catheter, with resolution of the kink of the catheter. Similar appearance left lung opacity, with minimal aeration of the mid lung and upper lung. Continued opacification of the left hemidiaphragm, left heart border, and the pleuroparenchymal interface. Calcified hilar and mediastinal lymph nodes. Increasing opacity of the right middle lobe, just below the minor fissure. IMPRESSION: Persisting opacity on the left, compatible with pleural effusion, atelectasis/consolidation, and/or edema. Increasing airspace opacity of the right middle lobe, potentially atelectasis or consolidation. Repositioning of the left-sided pigtail thoracostomy tube, with resolution of the kink. Unchanged right IJ catheter, tracheostomy. Electronically Signed   By: Gilmer Mor D.O.   On: 09/13/2018 13:15    Assessment/Plan Active Problems:   Acute on chronic respiratory failure with hypoxia (HCC)   Spinal cord injury, cervical region (  HCC)   TBI (traumatic brain injury) (HCC)   Lobar pneumonia, unspecified organism (HCC)   MRSA bacteremia   1. Acute on chronic respiratory failure with hypoxia continue with T collar trials and secretion management at this time.  Currently requiring 40% FiO2 via T collar.  Continue to wean O2 as tolerated.  Continue aggressive pulmonary toilet and secretion  management 2. Spinal cord 3 cervical region continue with therapy 3. Medic brain injury unchanged 4. Lobar pneumonia treated with residual pulmonary damage 5. MRSA treatment continue supportive care   I have personally seen and evaluated the patient, evaluated laboratory and imaging results, formulated the assessment and plan and placed orders. The Patient requires high complexity decision making for assessment and support.  Case was discussed on Rounds with the Respiratory Therapy Staff  Yevonne Pax, MD [facility] Pulmonary Critical Care Medicine Sleep Medicine

## 2018-09-14 NOTE — Progress Notes (Addendum)
Pulmonary Critical Care Medicine [facility] [place]   PULMONARY CRITICAL CARE SERVICE  PROGRESS NOTE  Date of Service: 09/14/2018  ERDEM BELLINO  DVV:[id number]  DOB: [birth year]   DOA: 07/25/2018  Referring Physician: Carron Curie, MD  HPI: JAMARL ALCAUTER is a 68 y.o. male seen for follow up of Acute on Chronic Respiratory Failure.  Patient remains on T collar 40% FiO2 doing well at this time.  Continues to have copious secretions with duo nebs and hypertonic saline in place.  Medications: Reviewed on Rounds  Physical Exam:  Vitals: Pulse 80 respirations 20 BP 95/65 O2 sat 97% temp 97.5  Ventilator Settings not currently ventilator  . General: Comfortable at this time . Eyes: Grossly normal lids, irises & conjunctiva . ENT: grossly tongue is normal . Neck: no obvious mass . Cardiovascular: S1 S2 normal no gallop . Respiratory: No rales or rhonchi noted . Abdomen: soft . Skin: no rash seen on limited exam . Musculoskeletal: not rigid . Psychiatric:unable to assess . Neurologic: no seizure no involuntary movements         Lab Data:   Basic Metabolic Panel: Recent Labs  Lab 09/10/18 1210 09/13/18 0347  NA  --  135  K  --  3.7  CL  --  101  CO2  --  25  GLUCOSE  --  97  BUN  --  25*  CREATININE 1.06 0.74  CALCIUM  --  8.8*  MG  --  1.9    ABG: No results for input(s): PHART, PCO2ART, PO2ART, HCO3, O2SAT in the last 168 hours.  Liver Function Tests: No results for input(s): AST, ALT, ALKPHOS, BILITOT, PROT, ALBUMIN in the last 168 hours. No results for input(s): LIPASE, AMYLASE in the last 168 hours. No results for input(s): AMMONIA in the last 168 hours.  CBC: Recent Labs  Lab 09/13/18 0347  WBC 10.1  HGB 8.0*  HCT 25.7*  MCV 86.0  PLT 636*    Cardiac Enzymes: No results for input(s): CKTOTAL, CKMB, CKMBINDEX, TROPONINI in the last 168 hours.  BNP (last 3 results) No results for input(s): BNP in the last 8760 hours.  ProBNP (last 3  results) No results for input(s): PROBNP in the last 8760 hours.  Radiological Exams: Dg Chest Port 1 View  Result Date: 09/13/2018 CLINICAL DATA:  68 year old male with a history of left-sided pleural effusion EXAM: PORTABLE CHEST 1 VIEW COMPARISON:  09/10/2018, 09/09/2018 FINDINGS: Cardiomediastinal silhouette likely unchanged though partially obscured by overlying lung/pleural disease. Left heart border is obscured. Unchanged tracheostomy and surgical changes of the cervical region. Unchanged right IJ central venous catheter. There has been repositioning of the left thoracostomy pigtail catheter, with resolution of the kink of the catheter. Similar appearance left lung opacity, with minimal aeration of the mid lung and upper lung. Continued opacification of the left hemidiaphragm, left heart border, and the pleuroparenchymal interface. Calcified hilar and mediastinal lymph nodes. Increasing opacity of the right middle lobe, just below the minor fissure. IMPRESSION: Persisting opacity on the left, compatible with pleural effusion, atelectasis/consolidation, and/or edema. Increasing airspace opacity of the right middle lobe, potentially atelectasis or consolidation. Repositioning of the left-sided pigtail thoracostomy tube, with resolution of the kink. Unchanged right IJ catheter, tracheostomy. Electronically Signed   By: Gilmer Mor D.O.   On: 09/13/2018 13:15    Assessment/Plan Active Problems:   Acute on chronic respiratory failure with hypoxia (HCC)   Spinal cord injury, cervical region [facility])   TBI (traumatic brain  injury) (HCC)   Lobar pneumonia, unspecified organism (HCC)   MRSA bacteremia   1. Acute on chronic respiratory failure with hypoxia continue with T collar trials and secretion management at this time currently requires 40% FiO2 via trach collar unable to wean any further at this time.  Continue pulmonary toilet secretion management. 2. Spinal cord injury cervical region,  continue with present therapy 3. Traumatic brain injury unchanged 4. Lobar pneumonia treated with residual pulmonary damage 5. MRSA treatment continue supportive care   I have personally seen and evaluated the patient, evaluated laboratory and imaging results, formulated the assessment and plan and placed orders. The Patient requires high complexity decision making for assessment and support.  Case was discussed on Rounds with the Respiratory Therapy Staff  Yevonne Pax, MD [facility] Pulmonary Critical Care Medicine Sleep Medicine

## 2018-09-14 NOTE — Progress Notes (Signed)
PA called to [facility] to follow-up on chest tube.  Per RN, little to no output since being placed to wall suction.   Based on CXR from yesterday, patient appeared to have a large pleural effusion.  No kinking was noted at the time.  RN denies any kinking today.   Patient currently on T-collar, but requiring frequent suctioning.  Per RN, no additional imaging ordered at this time.  If no improvement or new concerns related to chest tube could consider repeat CT Chest.   IR remains available.   Loyce Dys, MS RD PA-C 12:30 PM

## 2018-09-15 ENCOUNTER — Other Ambulatory Visit (HOSPITAL_COMMUNITY): Payer: Self-pay

## 2018-09-15 NOTE — Progress Notes (Addendum)
Pulmonary Critical Care Medicine [facility] [place]   PULMONARY CRITICAL CARE SERVICE  PROGRESS NOTE  Date of Service: 09/15/2018  Wayne Buckley  [id number]  DOB: July 25, 1950   DOA: 07/25/2018  Referring Physician: Carron Curie, MD  HPI: Wayne Buckley is a 68 y.o. male seen for follow up of Acute on Chronic Respiratory Failure.  A rapid response was called on the patient overnight and early this morning apparently the patient had a mucous plug.  He was placed on heated high flow nasal cannula at 30 L and 40% FiO2.  He seems to be doing well at this time.  Medications: Reviewed on Rounds  Physical Exam:  Vitals: Pulse 87 respirations 29 BP 128/88 O2 sat 99% temp 98.1  Ventilator Settings not currently on ventilator  . General: Comfortable at this time . Eyes: Grossly normal lids, irises & conjunctiva . ENT: grossly tongue is normal . Neck: no obvious mass . Cardiovascular: S1 S2 normal no gallop . Respiratory: No rales or rhonchi noted . Abdomen: soft . Skin: no rash seen on limited exam . Musculoskeletal: not rigid . Psychiatric:unable to assess . Neurologic: no seizure no involuntary movements         Lab Data:   Basic Metabolic Panel: Recent Labs  Lab 09/10/18 1210 09/13/18 0347  NA  --  135  K  --  3.7  CL  --  101  CO2  --  25  GLUCOSE  --  97  BUN  --  25*  CREATININE 1.06 0.74  CALCIUM  --  8.8*  MG  --  1.9    ABG: No results for input(s): PHART, PCO2ART, PO2ART, HCO3, O2SAT in the last 168 hours.  Liver Function Tests: No results for input(s): AST, ALT, ALKPHOS, BILITOT, PROT, ALBUMIN in the last 168 hours. No results for input(s): LIPASE, AMYLASE in the last 168 hours. No results for input(s): AMMONIA in the last 168 hours.  CBC: Recent Labs  Lab 09/13/18 0347  WBC 10.1  HGB 8.0*  HCT 25.7*  MCV 86.0  PLT 636*    Cardiac Enzymes: No results for input(s): CKTOTAL, CKMB, CKMBINDEX, TROPONINI in the last 168 hours.  BNP (last  3 results) No results for input(s): BNP in the last 8760 hours.  ProBNP (last 3 results) No results for input(s): PROBNP in the last 8760 hours.  Radiological Exams: Dg Chest Port 1 View  Result Date: 09/15/2018 CLINICAL DATA:  Bronchial mucous plugging EXAM: PORTABLE CHEST 1 VIEW COMPARISON:  09/13/2018 FINDINGS: Tracheostomy tube remains in place. Left pleural drainage catheter noted with significant reduction in sizable left pleural effusion. Hazy density at the right lung base similar to prior. Right central line tip: Right atrium. Calcified left hilar nodes. Atherosclerotic calcification of the aortic arch. IMPRESSION: 1. Considerable reduction in the left pleural effusion with resulting interval re-expansion of much of the left lung. 2.  Aortic Atherosclerosis (ICD10-I70.0). 3. Right central line tip: Right atrium. Electronically Signed   By: Gaylyn Rong M.D.   On: 09/15/2018 13:25    Assessment/Plan Active Problems:   Acute on chronic respiratory failure with hypoxia (HCC)   Spinal cord injury, cervical region [facility])   TBI (traumatic brain injury) (HCC)   Lobar pneumonia, unspecified organism (HCC)   MRSA bacteremia   1. Acute on chronic respiratory failure with hypoxia continue with T collar trials and secretion management this time.  He is currently on heated high flow nasal cannula 30 L and 40% FiO2.  Continue aggressive pulmonary toilet and secretion management. 2. Spinal cord injury cervical region continue with present therapy 3. Traumatic brain injury unchanged 4. Lobar pneumonia treated with residual pulmonary damage 5. MRSA treatment continue supportive care   I have personally seen and evaluated the patient, evaluated laboratory and imaging results, formulated the assessment and plan and placed orders. The Patient requires high complexity decision making for assessment and support.  Case was discussed on Rounds with the Respiratory Therapy Staff  Yevonne Pax,  MD [facility] Pulmonary Critical Care Medicine Sleep Medicine

## 2018-09-16 LAB — VANCOMYCIN, TROUGH: Vancomycin Tr: 12 ug/mL — ABNORMAL LOW (ref 15–20)

## 2018-09-16 NOTE — Progress Notes (Addendum)
Pulmonary Critical Care Medicine [facility] [place]   PULMONARY CRITICAL CARE SERVICE  PROGRESS NOTE  Date of Service: 09/16/2018  Wayne Buckley  [id number]  DOB: [birth year]   DOA: 07/25/2018  Referring Physician: Carron Curie, MD  HPI: Wayne Buckley is a 68 y.o. male seen for follow up of Acute on Chronic Respiratory Failure.  Patient remains on heated high flow nasal cannula settings are 35 L and 30% FiO2.  Medications: Reviewed on Rounds  Physical Exam:  Vitals: Pulse 90 respirations 19 BP 98/62 O2 sat 97% temp 97.9  Ventilator Settings not currently on ventilator  . General: Comfortable at this time . Eyes: Grossly normal lids, irises & conjunctiva . ENT: grossly tongue is normal . Neck: no obvious mass . Cardiovascular: S1 S2 normal no gallop . Respiratory: No rales or rhonchi noted . Abdomen: soft . Skin: no rash seen on limited exam . Musculoskeletal: not rigid . Psychiatric:unable to assess . Neurologic: no seizure no involuntary movements         Lab Data:   Basic Metabolic Panel: Recent Labs  Lab 09/10/18 1210 09/13/18 0347  NA  --  135  K  --  3.7  CL  --  101  CO2  --  25  GLUCOSE  --  97  BUN  --  25*  CREATININE 1.06 0.74  CALCIUM  --  8.8*  MG  --  1.9    ABG: No results for input(s): PHART, PCO2ART, PO2ART, HCO3, O2SAT in the last 168 hours.  Liver Function Tests: No results for input(s): AST, ALT, ALKPHOS, BILITOT, PROT, ALBUMIN in the last 168 hours. No results for input(s): LIPASE, AMYLASE in the last 168 hours. No results for input(s): AMMONIA in the last 168 hours.  CBC: Recent Labs  Lab 09/13/18 0347  WBC 10.1  HGB 8.0*  HCT 25.7*  MCV 86.0  PLT 636*    Cardiac Enzymes: No results for input(s): CKTOTAL, CKMB, CKMBINDEX, TROPONINI in the last 168 hours.  BNP (last 3 results) No results for input(s): BNP in the last 8760 hours.  ProBNP (last 3 results) No results for input(s): PROBNP in the last 8760  hours.  Radiological Exams: Dg Chest Port 1 View  Result Date: 09/15/2018 CLINICAL DATA:  Bronchial mucous plugging EXAM: PORTABLE CHEST 1 VIEW COMPARISON:  09/13/2018 FINDINGS: Tracheostomy tube remains in place. Left pleural drainage catheter noted with significant reduction in sizable left pleural effusion. Hazy density at the right lung base similar to prior. Right central line tip: Right atrium. Calcified left hilar nodes. Atherosclerotic calcification of the aortic arch. IMPRESSION: 1. Considerable reduction in the left pleural effusion with resulting interval re-expansion of much of the left lung. 2.  Aortic Atherosclerosis (ICD10-I70.0). 3. Right central line tip: Right atrium. Electronically Signed   By: Gaylyn Rong M.D.   On: 09/15/2018 13:25    Assessment/Plan Active Problems:   Acute on chronic respiratory failure with hypoxia (HCC)   Spinal cord injury, cervical region [facility])   TBI (traumatic brain injury) (HCC)   Lobar pneumonia, unspecified organism (HCC)   MRSA bacteremia   1. Acute on chronic respiratory failure with hypoxia continue with T collar trials and secretion management this time.  Heated high flow is currently set at 35 L and 30% FiO2. 2. Spinal cord injury cervical region continue with present therapy 3. Traumatic brain injury unchanged 4. Lobar pneumonia treated with residual pulmonary damage 5. MRSA treatment continue supportive care   I have  personally seen and evaluated the patient, evaluated laboratory and imaging results, formulated the assessment and plan and placed orders. The Patient requires high complexity decision making for assessment and support.  Case was discussed on Rounds with the Respiratory Therapy Staff  Allyne Gee, MD [facility] Pulmonary Critical Care Medicine Sleep Medicine

## 2018-09-16 NOTE — Progress Notes (Signed)
Patient ID: Wayne Buckley, male   DOB: Jan 16, 1951, 68 y.o.   MRN: [id number]   IR rounding note via phone-- per new regulations  Chest tube was placed back to [facility] 3/27 CXR 3/29:IMPRESSION: 1. Considerable reduction in the left pleural effusion with resulting interval re-expansion of much of the left lung. 2.  Aortic Atherosclerosis (ICD10-I70.0). 3. Right central line tip: Right atrium.  RN states pt is feeling so much better No air leak that she can tell Scant OP in Pleurvac Site is clean and dry No kinks in tubing externally  Will discuss with Rad Continue Pleurvac and -20 suction for now

## 2018-09-17 ENCOUNTER — Other Ambulatory Visit (HOSPITAL_COMMUNITY): Payer: Self-pay

## 2018-09-17 NOTE — Progress Notes (Signed)
Patient ID: Wayne Buckley, male   DOB: 1950-08-04, 68 y.o.   MRN: [id number]  IR Round note via phone-- new regulations  Chest tube placed in IR 07/08/18/ pleurvac-- secondary complex L pleural effusion Transferred to [facility] 07/25/18 Had been on no suction-- just to bag drain until 3/24 when called to evaluate for kinked tubing  See 3/24/ note  Replaced to Pleurvac -20 suction on same day Not much OP at all per RN No air leak Pt feeling better  CXR today IMPRESSION: 1. Increased atelectasis and questionably increased effusion on the left. Can not completely exclude a kink at the upper portion of the pleural drainage catheter laterally, a lateral projection would be necessary to assess the acuteness of the curvature. 2.  Aortic Atherosclerosis (ICD10-I70.0). 3. Right central line tip: Right atrium. 4. Trace fluid in the minor fissure.  Discussed with Dr Grace Isaac Will water seal now If no PTX in 24 hrs-- will pull Chest tube  RN aware Order in for water seal

## 2018-09-17 NOTE — Progress Notes (Addendum)
Pulmonary Critical Care Medicine [facility] [place]   PULMONARY CRITICAL CARE SERVICE  PROGRESS NOTE  Date of Service: 09/17/2018  Wayne Buckley  TML:[id number]  DOB: Aug 31, 1950   DOA: 07/25/2018  Referring Physician: Carron Curie, MD  HPI: Wayne Buckley is a 68 y.o. male seen for follow up of Acute on Chronic Respiratory Failure.  Patient remains on heated high flow nasal cannula time current settings are 35 L and 40% FiO2.  Patient is doing well at this time with no acute distress noted.  Medications: Reviewed on Rounds  Physical Exam:  Vitals: Pulse 106 respirations 31 BP 9465 O2 sat 90% temp 9.1  Ventilator Settings not currently on ventilator  . General: Comfortable at this time . Eyes: Grossly normal lids, irises & conjunctiva . ENT: grossly tongue is normal . Neck: no obvious mass . Cardiovascular: S1 S2 normal no gallop . Respiratory: No rales or rhonchi noted . Abdomen: soft . Skin: no rash seen on limited exam . Musculoskeletal: not rigid . Psychiatric:unable to assess . Neurologic: no seizure no involuntary movements         Lab Data:   Basic Metabolic Panel: Recent Labs  Lab 09/13/18 0347  NA 135  K 3.7  CL 101  CO2 25  GLUCOSE 97  BUN 25*  CREATININE 0.74  CALCIUM 8.8*  MG 1.9    ABG: No results for input(s): PHART, PCO2ART, PO2ART, HCO3, O2SAT in the last 168 hours.  Liver Function Tests: No results for input(s): AST, ALT, ALKPHOS, BILITOT, PROT, ALBUMIN in the last 168 hours. No results for input(s): LIPASE, AMYLASE in the last 168 hours. No results for input(s): AMMONIA in the last 168 hours.  CBC: Recent Labs  Lab 09/13/18 0347  WBC 10.1  HGB 8.0*  HCT 25.7*  MCV 86.0  PLT 636*    Cardiac Enzymes: No results for input(s): CKTOTAL, CKMB, CKMBINDEX, TROPONINI in the last 168 hours.  BNP (last 3 results) No results for input(s): BNP in the last 8760 hours.  ProBNP (last 3 results) No results for input(s): PROBNP in the  last 8760 hours.  Radiological Exams: Dg Chest Port 1 View  Result Date: 09/17/2018 CLINICAL DATA:  Pleural effusion EXAM: PORTABLE CHEST 1 VIEW COMPARISON:  09/15/2018 FINDINGS: Tracheostomy tube noted. The patient is rotated to the left on today's radiograph, reducing diagnostic sensitivity and specificity. Left pleural drainage catheter remains in place. Even accounting for the degree of rotation, there is increased opacification of the left hemithorax thought to likely be due to atelectasis and possible enlarging pleural effusion. Curvature of the posterolateral portion of the left pleural drainage catheter could be a kink but without a lateral view this is uncertain. Right central line tip: Right atrium. Atherosclerotic calcification of the aortic arch. Trace fluid in the minor fissure. Old granulomatous disease. IMPRESSION: 1. Increased atelectasis and questionably increased effusion on the left. Can not completely exclude a kink at the upper portion of the pleural drainage catheter laterally, a lateral projection would be necessary to assess the acuteness of the curvature. 2.  Aortic Atherosclerosis (ICD10-I70.0). 3. Right central line tip: Right atrium. 4. Trace fluid in the minor fissure. Electronically Signed   By: Gaylyn Rong M.D.   On: 09/17/2018 08:29    Assessment/Plan Active Problems:   Acute on chronic respiratory failure with hypoxia (HCC)   Spinal cord injury, cervical region [facility])   TBI (traumatic brain injury) (HCC)   Lobar pneumonia, unspecified organism (HCC)   MRSA  bacteremia   1. Acute on chronic respiratory failure with hypoxia continue with heated high flow nasal cannula currently at 40% FiO2.  Continue to monitor secretions well as supportive measures. 2. Spinal cord injury cervical region continue present therapy 3. Traumatic brain injury unchanged 4. Lobar pneumonia treated with residual pulmonary damage 5. MRSA treatment continue supportive care   I have  personally seen and evaluated the patient, evaluated laboratory and imaging results, formulated the assessment and plan and placed orders. The Patient requires high complexity decision making for assessment and support.  Case was discussed on Rounds with the Respiratory Therapy Staff  Yevonne Pax, MD [facility] Pulmonary Critical Care Medicine Sleep Medicine

## 2018-09-18 ENCOUNTER — Other Ambulatory Visit (HOSPITAL_COMMUNITY): Payer: Self-pay

## 2018-09-18 DIAGNOSIS — J9809 Other diseases of bronchus, not elsewhere classified: Secondary | ICD-10-CM

## 2018-09-18 NOTE — Progress Notes (Addendum)
Pulmonary Critical Care Medicine [place]   PULMONARY CRITICAL CARE SERVICE  PROGRESS NOTE  Date of Service: 09/18/2018  Wayne Buckley  VCB:[id number]  DOB: May 11, 1951   DOA: 07/25/2018  Referring Physician: Merton Border, MD  HPI: Wayne Buckley is a 68 y.o. male seen for follow up of Acute on Chronic Respiratory Failure.  Patient's sputum culture has resulted with a positive acid-fast.  Staff also reports patient coded overnight received CPR for a few minutes and ultimately a mucous plug was found and displaced.  He is currently resting in the bed on heated high flow nasal cannula with the settings of 40 L and 80% FiO2.  Overall doing well at this time.  Medications: Reviewed on Rounds  Physical Exam:  Vitals: Pulse 102 respirations 28 BP 109/60 O2 sat 99% temp 100.0  Ventilator Settings not currently on ventilator  . General: Comfortable at this time . Eyes: Grossly normal lids, irises & conjunctiva . ENT: grossly tongue is normal . Neck: no obvious mass . Cardiovascular: S1 S2 normal no gallop . Respiratory: No rales or rhonchi noted . Abdomen: soft . Skin: no rash seen on limited exam . Musculoskeletal: not rigid . Psychiatric:unable to assess . Neurologic: no seizure no involuntary movements         Lab Data:   Basic Metabolic Panel: Recent Labs  Lab 09/13/18 0347  NA 135  K 3.7  CL 101  CO2 25  GLUCOSE 97  BUN 25*  CREATININE 0.74  CALCIUM 8.8*  MG 1.9    ABG: No results for input(s): PHART, PCO2ART, PO2ART, HCO3, O2SAT in the last 168 hours.  Liver Function Tests: No results for input(s): AST, ALT, ALKPHOS, BILITOT, PROT, ALBUMIN in the last 168 hours. No results for input(s): LIPASE, AMYLASE in the last 168 hours. No results for input(s): AMMONIA in the last 168 hours.  CBC: Recent Labs  Lab 09/13/18 0347  WBC 10.1  HGB 8.0*  HCT 25.7*  MCV 86.0  PLT 636*    Cardiac Enzymes: No results for input(s): CKTOTAL, CKMB,  CKMBINDEX, TROPONINI in the last 168 hours.  BNP (last 3 results) No results for input(s): BNP in the last 8760 hours.  ProBNP (last 3 results) No results for input(s): PROBNP in the last 8760 hours.  Radiological Exams: Dg Chest Port 1 View  Result Date: 09/17/2018 CLINICAL DATA:  Pleural effusion EXAM: PORTABLE CHEST 1 VIEW COMPARISON:  09/15/2018 FINDINGS: Tracheostomy tube noted. The patient is rotated to the left on today's radiograph, reducing diagnostic sensitivity and specificity. Left pleural drainage catheter remains in place. Even accounting for the degree of rotation, there is increased opacification of the left hemithorax thought to likely be due to atelectasis and possible enlarging pleural effusion. Curvature of the posterolateral portion of the left pleural drainage catheter could be a kink but without a lateral view this is uncertain. Right central line tip: Right atrium. Atherosclerotic calcification of the aortic arch. Trace fluid in the minor fissure. Old granulomatous disease. IMPRESSION: 1. Increased atelectasis and questionably increased effusion on the left. Can not completely exclude a kink at the upper portion of the pleural drainage catheter laterally, a lateral projection would be necessary to assess the acuteness of the curvature. 2.  Aortic Atherosclerosis (ICD10-I70.0). 3. Right central line tip: Right atrium. 4. Trace fluid in the minor fissure. Electronically Signed   By: Van Clines M.D.   On: 09/17/2018 08:29    Assessment/Plan Active Problems:   Acute on  chronic respiratory failure with hypoxia (HCC)   Spinal cord injury, cervical region [facility])   TBI (traumatic brain injury) ([place])   Lobar pneumonia, unspecified organism ([place])   MRSA bacteremia   1. Acute on chronic respiratory failure with hypoxia continue with heated high flow nasal cannula at 80% FiO2 as needed.  May titrate oxygen as possible.  Continue to monitor secretions as well as pulmonary  toilet. 2. Spinal cord injury cervical region continue present therapy 3. Traumatic brain injury unchanged 4. Lobar pneumonia treated with residual pulmonary damage 5. MRSA treatment continue supportive care   I have personally seen and evaluated the patient, evaluated laboratory and imaging results, formulated the assessment and plan and placed orders.  Patient is critically ill time spent 35 minutes patient has had near continuous issues overnight again and also now has possible MAC infection treatment regimen discussed with the primary care team The Patient requires high complexity decision making for assessment and support.  Case was discussed on Rounds with the Respiratory Therapy Staff  Allyne Gee, MD [facility] Pulmonary Critical Care Medicine Sleep Medicine

## 2018-09-18 NOTE — Progress Notes (Signed)
Patient ID: Wayne Buckley, male   DOB: [birth year], 68 y.o.   MRN: [id number]   CXR today: IMPRESSION: 1. Worsened lung aeration on the left. Left hemithorax is now opacified. This is consistent a combination of pleural fluid and lung consolidation and/or atelectasis. 2. Support apparatus is stable.  Chest tube has been on water seal since yesterday at noon No OP in [place] + mucus plugs per Dr Manson Passey +code yesterday + AFB per Bronch yesterday  I have reviewed CXR with Dr Loreta Ave I have spoken to Dr Manson Passey  Plan: CT Chest with Cx now Evaluate tubing for functionality  All agree

## 2018-09-19 ENCOUNTER — Other Ambulatory Visit (HOSPITAL_COMMUNITY): Payer: Self-pay

## 2018-09-19 LAB — ACID FAST SMEAR (AFB, MYCOBACTERIA): Acid Fast Smear: NEGATIVE

## 2018-09-19 NOTE — Progress Notes (Signed)
Pulmonary Critical Care Medicine [place]   PULMONARY CRITICAL CARE SERVICE  PROGRESS NOTE  Date of Service: 09/19/2018  Wayne Buckley  NIO:[id number]  DOB: 23-Jun-1950   DOA: 07/25/2018  Referring Physician: Merton Border, MD  HPI: Wayne Buckley is a 68 y.o. male seen for follow up of Acute on Chronic Respiratory Failure.  Patient is comfortable has however been on high flow at 40 L and 80% FiO2 the saturations are 94 to 96% so therefore discussed around the possibility of weaning the FiO2 down.  Right now is not needing any mechanical ventilation or ventilatory support.  Patient's isolation has been discontinued for AFB since he did not grow MTB  Medications: Reviewed on Rounds  Physical Exam:  Vitals: Temperature is 100.5 pulse 113 respiratory rate 30 blood pressure 107/88 saturations 94%  Ventilator Settings off the ventilator at this time on 40 L with 80% FiO2  . General: Comfortable at this time . Eyes: Grossly normal lids, irises & conjunctiva . ENT: grossly tongue is normal . Neck: no obvious mass . Cardiovascular: S1 S2 normal no gallop . Respiratory: No rhonchi or rales are noted at this time . Abdomen: soft . Skin: no rash seen on limited exam . Musculoskeletal: not rigid . Psychiatric:unable to assess . Neurologic: no seizure no involuntary movements         Lab Data:   Basic Metabolic Panel: Recent Labs  Lab 09/13/18 0347  NA 135  K 3.7  CL 101  CO2 25  GLUCOSE 97  BUN 25*  CREATININE 0.74  CALCIUM 8.8*  MG 1.9    ABG: No results for input(s): PHART, PCO2ART, PO2ART, HCO3, O2SAT in the last 168 hours.  Liver Function Tests: No results for input(s): AST, ALT, ALKPHOS, BILITOT, PROT, ALBUMIN in the last 168 hours. No results for input(s): LIPASE, AMYLASE in the last 168 hours. No results for input(s): AMMONIA in the last 168 hours.  CBC: Recent Labs  Lab 09/13/18 0347  WBC 10.1  HGB 8.0*  HCT 25.7*  MCV 86.0  PLT 636*     Cardiac Enzymes: No results for input(s): CKTOTAL, CKMB, CKMBINDEX, TROPONINI in the last 168 hours.  BNP (last 3 results) No results for input(s): BNP in the last 8760 hours.  ProBNP (last 3 results) No results for input(s): PROBNP in the last 8760 hours.  Radiological Exams: Dg Chest Port 1 View  Result Date: 09/19/2018 CLINICAL DATA:  Chest tube placement.  Follow-up exam. EXAM: PORTABLE CHEST 1 VIEW COMPARISON:  09/18/2018 and older exams. FINDINGS: Left hemithorax remains opacified. Left inferior hemithorax chest tube is stable. No pneumothorax. Mild opacity noted in the medial right lung base consistent with atelectasis. Right lung otherwise clear. Tracheostomy tube and right sided central venous line are stable. IMPRESSION: 1. Mild medial right lung base opacity is more prominent on the previous day's study, most likely due to atelectasis. Remainder of the right lung is clear. 2. Left hemithorax remains opacified and unchanged from the previous day's study. 3. No pneumothorax. 4. Support apparatus is stable. Electronically Signed   By: Lajean Manes M.D.   On: 09/19/2018 14:43   Dg Chest Port 1 View  Result Date: 09/18/2018 CLINICAL DATA:  Reason for exam: pleural effusionOrder comments: Left effusion -pleurvac in place -- water sealed 3/31 12 noon. Patient was nonverbal. Hx of htn, DVT 9/19. Hx of trach tube placement 08/19/2018. EXAM: PORTABLE CHEST 1 VIEW COMPARISON:  09/17/2018 and older studies. FINDINGS: Since the previous  day's study, the left hemithorax has become completely opacified. Pigtail chest tube is stable lying above the left hemidiaphragm. No pneumothorax. Right lung is clear. Tracheostomy tube and right central venous line are stable. IMPRESSION: 1. Worsened lung aeration on the left. Left hemithorax is now opacified. This is consistent a combination of pleural fluid and lung consolidation and/or atelectasis. 2. Support apparatus is stable. Electronically Signed   By:  Lajean Manes M.D.   On: 09/18/2018 12:37    Assessment/Plan Active Problems:   Acute on chronic respiratory failure with hypoxia (HCC)   Spinal cord injury, cervical region [facility])   TBI (traumatic brain injury) ([place])   Lobar pneumonia, unspecified organism ([place])   MRSA bacteremia   1. Acute on chronic respiratory failure with hypoxia continues to require high oxygen flow rate now on 80% FiO2 40 L.  Continue with oxygen therapy supportive care. 2. Spinal cord injury cervical region we will continue to monitor. 3. Traumatic brain injury unchanged. 4. Lobar pneumonia patient has grown AFB negative for TB and also negative for Mycobacterium avium complex however was not checked for gordonae and Kansii.  At this point suggested that we continue with the empiric therapy for atypical Mycobacterium.  Also have asked the lab to see if we can check for nocardia species 5. MRSA bacteremia pneumonia treated we will continue with supportive care   I have personally seen and evaluated the patient, evaluated laboratory and imaging results, formulated the assessment and plan and placed orders.  Time spent 35 minutes review of the chart discussion on rounds with primary care team The Patient requires high complexity decision making for assessment and support.  Case was discussed on Rounds with the Respiratory Therapy Staff  Allyne Gee, MD [facility] Pulmonary Critical Care Medicine Sleep Medicine

## 2018-09-19 NOTE — Progress Notes (Signed)
Patient unable to travel for CT scan yesterday due to acuity of condition.  Spoke with RN today who reports chest tube remains to suction with no output. Patient still on high flow Experiment and may not be able to get CT again today.  Spoke with Dr. Grace Isaac who recommends portable CXR today. If no change in imaging will likely place to water seal and repeat CXR tomorrow.   Loyce Dys, MS RD PA-C

## 2018-09-20 NOTE — Progress Notes (Signed)
IR following for L chest tube which has been in place since 07/08/18.  By CXR, patient with L opacified hemithorax and no apparent kink in chest tube.  CT Chest ordered to evaluate chest tube and determine state of effusion vs. Possible mucous plugging.  Patient has been unable to travel for CT scan.  Discussed with RN today, patient with increased secretions today.   Will continue to look for Chest CT to determine whether chest tube can be removed.   Loyce Dys, MS RD PA-C 4:12 PM

## 2018-09-20 NOTE — Progress Notes (Addendum)
Pulmonary Critical Care Medicine [place]   PULMONARY CRITICAL CARE SERVICE  PROGRESS NOTE  Date of Service: 09/20/2018  HASTON CASEBOLT  CLE:[id number]  DOB: [birth year]   DOA: 07/25/2018  Referring Physician: Merton Border, MD  HPI: Wayne Buckley is a 68 y.o. male seen for follow up of Acute on Chronic Respiratory Failure.  Patient remains on heated high flow nasal cannula settings of 40 L and 70% FiO2.  Continues to have a copious amount of secretions at this time.  Medications: Reviewed on Rounds  Physical Exam:  Vitals: Pulse 118 respirations 30 BP 141/72 O2 sat 97% temp 100.6  Ventilator Settings heated high flow settings 40 L and 70% FiO2.  . General: Comfortable at this time . Eyes: Grossly normal lids, irises & conjunctiva . ENT: grossly tongue is normal . Neck: no obvious mass . Cardiovascular: S1 S2 normal no gallop . Respiratory: No rales or rhonchi noted . Abdomen: soft . Skin: no rash seen on limited exam . Musculoskeletal: not rigid . Psychiatric:unable to assess . Neurologic: no seizure no involuntary movements         Lab Data:   Basic Metabolic Panel: No results for input(s): NA, K, CL, CO2, GLUCOSE, BUN, CREATININE, CALCIUM, MG, PHOS in the last 168 hours.  ABG: No results for input(s): PHART, PCO2ART, PO2ART, HCO3, O2SAT in the last 168 hours.  Liver Function Tests: No results for input(s): AST, ALT, ALKPHOS, BILITOT, PROT, ALBUMIN in the last 168 hours. No results for input(s): LIPASE, AMYLASE in the last 168 hours. No results for input(s): AMMONIA in the last 168 hours.  CBC: No results for input(s): WBC, NEUTROABS, HGB, HCT, MCV, PLT in the last 168 hours.  Cardiac Enzymes: No results for input(s): CKTOTAL, CKMB, CKMBINDEX, TROPONINI in the last 168 hours.  BNP (last 3 results) No results for input(s): BNP in the last 8760 hours.  ProBNP (last 3 results) No results for input(s): PROBNP in the last 8760 hours.  Radiological  Exams: Dg Chest Port 1 View  Result Date: 09/19/2018 CLINICAL DATA:  Chest tube placement.  Follow-up exam. EXAM: PORTABLE CHEST 1 VIEW COMPARISON:  09/18/2018 and older exams. FINDINGS: Left hemithorax remains opacified. Left inferior hemithorax chest tube is stable. No pneumothorax. Mild opacity noted in the medial right lung base consistent with atelectasis. Right lung otherwise clear. Tracheostomy tube and right sided central venous line are stable. IMPRESSION: 1. Mild medial right lung base opacity is more prominent on the previous day's study, most likely due to atelectasis. Remainder of the right lung is clear. 2. Left hemithorax remains opacified and unchanged from the previous day's study. 3. No pneumothorax. 4. Support apparatus is stable. Electronically Signed   By: Lajean Manes M.D.   On: 09/19/2018 14:43    Assessment/Plan Active Problems:   Acute on chronic respiratory failure with hypoxia (HCC)   Spinal cord injury, cervical region [facility])   TBI (traumatic brain injury) ([place])   Lobar pneumonia, unspecified organism ([place])   MRSA bacteremia   1. Acute chronic respiratory failure with hypoxia continues to require high oxygen rate however FiO2 has been weaned on 70% today.  Remains on 40 L at this time.  Continue oxygen therapy and supportive measures.  Continue to wean as tolerating. 2. Spinal cord injury cervical region continue to monitor 3. Traumatic brain injury unchanged 4. Lobar pneumonia patient is AFB negative for TB and Mycobacterium avium complex.  Continue present therapy. 5. MRSA bacteremia pneumonia treated continue to  care   I have personally seen and evaluated the patient, evaluated laboratory and imaging results, formulated the assessment and plan and placed orders. The Patient requires high complexity decision making for assessment and support.  Case was discussed on Rounds with the Respiratory Therapy Staff  Allyne Gee, MD [facility] Pulmonary Critical Care  Medicine Sleep Medicine

## 2018-09-21 NOTE — Progress Notes (Addendum)
Pulmonary Critical Care Medicine [facility] [place]   PULMONARY CRITICAL CARE SERVICE  PROGRESS NOTE  Date of Service: 09/21/2018  Wayne Buckley  ESP:[id number]  DOB: [birth year]   DOA: 07/25/2018  Referring Physician: Carron Curie, MD  HPI: Wayne Buckley is a 68 y.o. male seen for follow up of Acute on Chronic Respiratory Failure.  Patient continues on heated high flow nasal cannula 30 L and 40% FiO2.  Continues to have moderate large amount of secretions.  Medications: Reviewed on Rounds  Physical Exam:  Vitals: Pulse 103 respirations 26 BP 122/73 O2 sat 93% temp 97.8  Ventilator Settings heated high flow 30 L 40%  . General: Comfortable at this time . Eyes: Grossly normal lids, irises & conjunctiva . ENT: grossly tongue is normal . Neck: no obvious mass . Cardiovascular: S1 S2 normal no gallop . Respiratory: No rales or rhonchi noted . Abdomen: soft . Skin: no rash seen on limited exam . Musculoskeletal: not rigid . Psychiatric:unable to assess . Neurologic: no seizure no involuntary movements         Lab Data:   Basic Metabolic Panel: No results for input(s): NA, K, CL, CO2, GLUCOSE, BUN, CREATININE, CALCIUM, MG, PHOS in the last 168 hours.  ABG: No results for input(s): PHART, PCO2ART, PO2ART, HCO3, O2SAT in the last 168 hours.  Liver Function Tests: No results for input(s): AST, ALT, ALKPHOS, BILITOT, PROT, ALBUMIN in the last 168 hours. No results for input(s): LIPASE, AMYLASE in the last 168 hours. No results for input(s): AMMONIA in the last 168 hours.  CBC: No results for input(s): WBC, NEUTROABS, HGB, HCT, MCV, PLT in the last 168 hours.  Cardiac Enzymes: No results for input(s): CKTOTAL, CKMB, CKMBINDEX, TROPONINI in the last 168 hours.  BNP (last 3 results) No results for input(s): BNP in the last 8760 hours.  ProBNP (last 3 results) No results for input(s): PROBNP in the last 8760 hours.  Radiological Exams: No results  found.  Assessment/Plan Active Problems:   Acute on chronic respiratory failure with hypoxia (HCC)   Spinal cord injury, cervical region [facility])   TBI (traumatic brain injury) (HCC)   Lobar pneumonia, unspecified organism (HCC)   MRSA bacteremia   1. Acute on chronic respiratory failure with hypoxia patient continues to require 40% FiO2 and 30 L through high flow nasal cannula.  Continue oxygen therapy and wean as tolerated. 2. Spinal cord injury cervical region continue to monitor 3. Traumatic brain injury unchanged 4. [place] patient is AFB negative for TB and Mycobacterium continue present therapy 5. MSRA bacteremia pneumonia treated continue supportive care   I have personally seen and evaluated the patient, evaluated laboratory and imaging results, formulated the assessment and plan and placed orders. The Patient requires high complexity decision making for assessment and support.  Case was discussed on Rounds with the Respiratory Therapy Staff  Yevonne Pax, MD [facility] Pulmonary Critical Care Medicine Sleep Medicine

## 2018-09-22 NOTE — Progress Notes (Addendum)
Pulmonary Critical Care Medicine [facility] [place]   PULMONARY CRITICAL CARE SERVICE  PROGRESS NOTE  Date of Service: 09/22/2018  Wayne Buckley  MMN:[id number]  DOB: [birth year]   DOA: 07/25/2018  Referring Physician: Carron Curie, MD  HPI: Wayne Buckley is a 68 y.o. male seen for follow up of Acute on Chronic Respiratory Failure.  Patient remains on heated high flow nasal cannula 30 L and 40% FiO2.  Respiratory reports that patient secretions have thinned significantly.  Acute distress noted.  Medications: Reviewed on Rounds  Physical Exam:  Vitals: Pulse 98 respirations 22 BP 128/79 O2 sat 97% 98.0  Ventilator Settings heated high flow 30 L 40% FiO2  . General: Comfortable at this time . Eyes: Grossly normal lids, irises & conjunctiva . ENT: grossly tongue is normal . Neck: no obvious mass . Cardiovascular: S1 S2 normal no gallop . Respiratory: No rales or rhonchi noted . Abdomen: soft . Skin: no rash seen on limited exam . Musculoskeletal: not rigid . Psychiatric:unable to assess . Neurologic: no seizure no involuntary movements         Lab Data:   Basic Metabolic Panel: No results for input(s): NA, K, CL, CO2, GLUCOSE, BUN, CREATININE, CALCIUM, MG, PHOS in the last 168 hours.  ABG: No results for input(s): PHART, PCO2ART, PO2ART, HCO3, O2SAT in the last 168 hours.  Liver Function Tests: No results for input(s): AST, ALT, ALKPHOS, BILITOT, PROT, ALBUMIN in the last 168 hours. No results for input(s): LIPASE, AMYLASE in the last 168 hours. No results for input(s): AMMONIA in the last 168 hours.  CBC: No results for input(s): WBC, NEUTROABS, HGB, HCT, MCV, PLT in the last 168 hours.  Cardiac Enzymes: No results for input(s): CKTOTAL, CKMB, CKMBINDEX, TROPONINI in the last 168 hours.  BNP (last 3 results) No results for input(s): BNP in the last 8760 hours.  ProBNP (last 3 results) No results for input(s): PROBNP in the last 8760 hours.  Radiological  Exams: No results found.  Assessment/Plan Active Problems:   Acute on chronic respiratory failure with hypoxia (HCC)   Spinal cord injury, cervical region [facility])   TBI (traumatic brain injury) (HCC)   Lobar pneumonia, unspecified organism (HCC)   MRSA bacteremia   1. Acute on chronic respiratory failure with hypoxia patient continues to require 40% FiO2 30 L on high flow nasal cannula.  Continue oxygen therapy and wean as tolerated. 2. Spinal cord injury cervical region continue to monitor 3. Traumatic brain injury unchanged 4. Lobar pneumonia, AFB negative, continue present therapy 5. MRSA bacteremia, pneumonia, treated continue to follow   I have personally seen and evaluated the patient, evaluated laboratory and imaging results, formulated the assessment and plan and placed orders. The Patient requires high complexity decision making for assessment and support.  Case was discussed on Rounds with the Respiratory Therapy Staff  Yevonne Pax, MD [facility] Pulmonary Critical Care Medicine Sleep Medicine

## 2018-09-23 ENCOUNTER — Other Ambulatory Visit (HOSPITAL_COMMUNITY): Payer: Self-pay

## 2018-09-23 MED ORDER — IOPAMIDOL (ISOVUE-300) INJECTION 61%: 100.0000 mL | Freq: Once | INTRAVENOUS | Status: DC | PRN

## 2018-09-23 MED ORDER — IOHEXOL 300 MG/ML  SOLN
75.0000 mL | Freq: Once | INTRAMUSCULAR | Status: AC | PRN
  Administered 2018-09-23: 75 mL via INTRAVENOUS

## 2018-09-23 NOTE — Progress Notes (Addendum)
Pulmonary Critical [facility] [place]   PULMONARY CRITICAL CARE SERVICE  PROGRESS NOTE  Date of Service: 09/23/2018  Wayne HunJames M Santor  ZOX:[id number]  DOB: 08/04/1950   DOA: 07/25/2018  Referring Physician: Carron CurieAli Hijazi, MD  HPI: Wayne Buckley is a 68 y.o. male seen for follow up of Acute on Chronic Respiratory Failure.  Patient remains on heated high flow nasal cannula 30 L and 40% FiO2.  He appears to be resting comfortably and afebrile at this time.  Medications: Reviewed on Rounds  Physical Exam:  Vitals: Pulse 110 respiration 17 BP 111/70 O2 sat 100% temp 99.8  Ventilator Settings currently on heated high flow 30 L 40% FiO2  . General: Comfortable at this time . Eyes: Grossly normal lids, irises & conjunctiva . ENT: grossly tongue is normal . Neck: no obvious mass . Cardiovascular: S1 S2 normal no gallop . Respiratory: No rales or rhonchi noted . Abdomen: soft . Skin: no rash seen on limited exam . Musculoskeletal: not rigid . Psychiatric:unable to assess . Neurologic: no seizure no involuntary movements         Lab Data:   Basic Metabolic Panel: No results for input(s): NA, K, CL, CO2, GLUCOSE, BUN, CREATININE, CALCIUM, MG, PHOS in the last 168 hours.  ABG: No results for input(s): PHART, PCO2ART, PO2ART, HCO3, O2SAT in the last 168 hours.  Liver Function Tests: No results for input(s): AST, ALT, ALKPHOS, BILITOT, PROT, ALBUMIN in the last 168 hours. No results for input(s): LIPASE, AMYLASE in the last 168 hours. No results for input(s): AMMONIA in the last 168 hours.  CBC: No results for input(s): WBC, NEUTROABS, HGB, HCT, MCV, PLT in the last 168 hours.  Cardiac Enzymes: No results for input(s): CKTOTAL, CKMB, CKMBINDEX, TROPONINI in the last 168 hours.  BNP (last 3 results) No results for input(s): BNP in the last 8760 hours.  ProBNP (last 3 results) No results for input(s): PROBNP in the last 8760 hours.  Radiological Exams: Ct  Chest W Contrast  Result Date: 09/23/2018 CLINICAL DATA:  Respiratory failure, poor aeration of the left lung and prior placement of left pleural pigtail drainage catheter for drainage of pleural effusion. EXAM: CT CHEST WITH CONTRAST TECHNIQUE: Multidetector CT imaging of the chest was performed during intravenous contrast administration. CONTRAST:  75mL OMNIPAQUE IOHEXOL 300 MG/ML  SOLN COMPARISON:  CT of the chest on 08/31/2018 and multiple recent chest x-ray studies. FINDINGS: Cardiovascular: The heart size is within normal limits. No pericardial fluid identified. Calcified coronary artery plaque again noted in the distribution of the LAD and left circumflex arteries and potentially also involving the left main coronary artery. The thoracic aorta and central pulmonary arteries are normal in caliber. The thoracic aorta is not felt to be aneurysmally dilated as suspected on the unenhanced CT previously with maximal diameter of approximately 3.9 cm. Mediastinum/Nodes: Stable calcified mediastinal and left hilar lymph nodes. There are some mildly prominent right hilar lymph nodes. Lungs/Pleura: Tracheostomy tube present. There is some debris/mucous in the distal trachea extending into both mainstem bronchi. Additional debris extends into the left lower lobe bronchus. There is narrowing of the left lower lobe bronchus and near occlusion of the left upper lobe bronchus. Extensive consolidation and atelectasis is noted of the left upper lobe and left lower lobe. The lingula shows less prominent atelectasis. There is only a small amount of residual left pleural fluid which is located near the indwelling posterior pigtail drainage catheter but also extends laterally, along the  base of the major fissure and additional component near the apex. There is not a significant component of undrained pleural fluid. Upper Abdomen: No acute abnormality. 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 IMPRESSION: 1. Extensive consolidation and atelectasis of the left upper lobe and left lower lobe. There is narrowing of the left lower lobe bronchus and near occlusion of the left upper lobe bronchus as well as debris and mucus in the distal trachea, bilateral mainstem bronchi and left lower lobe bronchus. 2. There is only a small amount of residual left pleural fluid with some located near the posterior pigtail drainage catheter and small component of loculation towards the apex. No pneumothorax. 3. The thoracic aorta is felt to be of normal caliber and not overtly dilated with maximal diameter of approximately 3.9 cm. 4. 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 Electronically Signed   By: Irish Lack M.D.   On: 09/23/2018 16:49    Assessment/Plan Active Problems:   Acute on chronic respiratory failure with hypoxia (HCC)   Spinal cord injury, cervical region [facility])   TBI (traumatic brain injury) (HCC)   Lobar pneumonia, unspecified organism (HCC)   MRSA bacteremia   1. Acute on chronic respiratory failure with hypoxia patient continues to require 40% FiO2 and 30 L on high flow nasal cannula.  Continue oxygen therapy and wean as tolerated. 2. Spinal cord injury cervical region continue to monitor 3. Traumatic brain injury unchanged 4. Lobar pneumonia, AFB negative, continue present therapy 5. MRSA bacteremia pneumonia treated needed 5   I have personally seen and evaluated the patient, evaluated laboratory and imaging results, formulated the assessment and plan and placed orders. The Patient requires high complexity decision making for assessment and support.  Case was discussed on Rounds with the Respiratory Therapy  Staff  Yevonne Pax, MD [facility] Pulmonary Critical Care Medicine Sleep Medicine

## 2018-09-23 NOTE — Progress Notes (Signed)
Spoke with RN, patient able to travel for CT today.  Will await results.   Loyce Dys, MS RD PA-C 9:50 AM

## 2018-09-24 ENCOUNTER — Other Ambulatory Visit (HOSPITAL_COMMUNITY): Payer: Self-pay

## 2018-09-24 NOTE — Progress Notes (Addendum)
Pulmonary Critical Care Medicine [facility] [place]   PULMONARY CRITICAL CARE SERVICE  PROGRESS NOTE  Date of Service: 09/24/2018  Wayne Buckley  KKX:[id number]  DOB: 09-Oct-1950   DOA: 07/25/2018  Referring Physician: Carron Curie, MD  HPI: Wayne Buckley is a 68 y.o. male seen for follow up of Acute on Chronic Respiratory Failure.  Patient coded this morning and received CPR.  The code appears brief and the patient is back on heated high flow nasal cannula 30 L and 40% FiO2.  Medications: Reviewed on Rounds  Physical Exam:  Vitals: Pulse 99 respiration 16 BP 117/76 O2 sat 98% temp 98.1  Ventilator Settings heated high flow 30 L 40% FiO2  . General: Comfortable at this time . Eyes: Grossly normal lids, irises & conjunctiva . ENT: grossly tongue is normal . Neck: no obvious mass . Cardiovascular: S1 S2 normal no gallop . Respiratory: No rales or rhonchi noted . Abdomen: soft . Skin: no rash seen on limited exam . Musculoskeletal: not rigid . Psychiatric:unable to assess . Neurologic: no seizure no involuntary movements         Lab Data:   Basic Metabolic Panel: No results for input(s): NA, K, CL, CO2, GLUCOSE, BUN, CREATININE, CALCIUM, MG, PHOS in the last 168 hours.  ABG: No results for input(s): PHART, PCO2ART, PO2ART, HCO3, O2SAT in the last 168 hours.  Liver Function Tests: No results for input(s): AST, ALT, ALKPHOS, BILITOT, PROT, ALBUMIN in the last 168 hours. No results for input(s): LIPASE, AMYLASE in the last 168 hours. No results for input(s): AMMONIA in the last 168 hours.  CBC: No results for input(s): WBC, NEUTROABS, HGB, HCT, MCV, PLT in the last 168 hours.  Cardiac Enzymes: No results for input(s): CKTOTAL, CKMB, CKMBINDEX, TROPONINI in the last 168 hours.  BNP (last 3 results) No results for input(s): BNP in the last 8760 hours.  ProBNP (last 3 results) No results for input(s): PROBNP in the last 8760 hours.  Radiological Exams: Ct  Chest W Contrast  Result Date: 09/23/2018 CLINICAL DATA:  Respiratory failure, poor aeration of the left lung and prior placement of left pleural pigtail drainage catheter for drainage of pleural effusion. EXAM: CT CHEST WITH CONTRAST TECHNIQUE: Multidetector CT imaging of the chest was performed during intravenous contrast administration. CONTRAST:  27mL OMNIPAQUE IOHEXOL 300 MG/ML  SOLN COMPARISON:  CT of the chest on 08/31/2018 and multiple recent chest x-ray studies. FINDINGS: Cardiovascular: The heart size is within normal limits. No pericardial fluid identified. Calcified coronary artery plaque again noted in the distribution of the LAD and left circumflex arteries and potentially also involving the left main coronary artery. The thoracic aorta and central pulmonary arteries are normal in caliber. The thoracic aorta is not felt to be aneurysmally dilated as suspected on the unenhanced CT previously with maximal diameter of approximately 3.9 cm. Mediastinum/Nodes: Stable calcified mediastinal and left hilar lymph nodes. There are some mildly prominent right hilar lymph nodes. Lungs/Pleura: Tracheostomy tube present. There is some debris/mucous in the distal trachea extending into both mainstem bronchi. Additional debris extends into the left lower lobe bronchus. There is narrowing of the left lower lobe bronchus and near occlusion of the left upper lobe bronchus. Extensive consolidation and atelectasis is noted of the left upper lobe and left lower lobe. The lingula shows less prominent atelectasis. There is only a small amount of residual left pleural fluid which is located near the indwelling posterior pigtail drainage catheter but also extends laterally, along  the base of the major fissure and additional component near the apex. There is not a significant component of undrained pleural fluid. Upper Abdomen: No acute abnormality. 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 IMPRESSION: 1. Extensive consolidation and atelectasis of the left upper lobe and left lower lobe. There is narrowing of the left lower lobe bronchus and near occlusion of the left upper lobe bronchus as well as debris and mucus in the distal trachea, bilateral mainstem bronchi and left lower lobe bronchus. 2. There is only a small amount of residual left pleural fluid with some located near the posterior pigtail drainage catheter and small component of loculation towards the apex. No pneumothorax. 3. The thoracic aorta is felt to be of normal caliber and not overtly dilated with maximal diameter of approximately 3.9 cm. 4. 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 Electronically Signed   By: Irish LackGlenn  Yamagata M.D.   On: 09/23/2018 16:49   Dg Chest Port 1 View  Result Date: 09/24/2018 CLINICAL DATA:  Chest tube removal EXAM: PORTABLE CHEST 1 VIEW COMPARISON:  09/19/2018 FINDINGS: Left chest tube has been removed. No pneumothorax. Tracheostomy and right central line remain in place, unchanged. Slight improved aeration on the left with continued near complete opacification due to airspace disease and effusion. Right basilar atelectasis. Cardiomegaly. IMPRESSION: Interval removal of left chest tube without pneumothorax. Continued near complete opacification of the left hemithorax with slight improvement in aeration. Right base atelectasis. Electronically Signed   By: Charlett NoseKevin  Dover M.D.   On: 09/24/2018 10:46    Assessment/Plan Active Problems:   Acute on chronic respiratory failure with hypoxia (HCC)   Spinal cord injury, cervical region [facility](HCC)   TBI (traumatic brain injury) (HCC)   Lobar pneumonia, unspecified organism (HCC)   MRSA  bacteremia   1. Acute on chronic respiratory failure with hypoxia patient continues to require 40% FiO2 and 30 L on high flow nasal cannula.  Continue oxygen therapy and wean as tolerated. 2. Spinal cord injury cervical region continue to monitor 3. Traumatic brain injury unchanged 4. Lobar pneumonia AFB negative continue present therapy 5. MRSA bacteremia pneumonia treated continue to follow   I have personally seen and evaluated the patient, evaluated laboratory and imaging results, formulated the assessment and plan and placed orders. The Patient requires high complexity decision making for assessment and support.  Case was discussed on Rounds with the Respiratory Therapy Staff  Yevonne PaxSaadat A , MD [facility] Pulmonary Critical Care Medicine Sleep Medicine

## 2018-09-24 NOTE — Progress Notes (Signed)
PROCEDURE SUMMARY:  Successful removal of left chest tube. No immediate complications.  EBL = 0 mL. Patient tolerated well.  Pressure dressing applied to site. CXR pending.  Please call IR with questions/concerns.  Gordy Councilman Daison Braxton PA-C 09/24/2018 9:55 AM

## 2018-09-25 NOTE — Progress Notes (Addendum)
Pulmonary Critical Care Medicine [place]   PULMONARY CRITICAL CARE SERVICE  PROGRESS NOTE  Date of Service: 09/25/2018  Wayne Buckley  EHM:[id number]  DOB: Sep 24, 1950   DOA: 07/25/2018  Referring Physician: Merton Border, MD  HPI: Wayne Buckley is a 68 y.o. male seen for follow up of Acute on Chronic Respiratory Failure.  Patient continues on high flow nasal cannula 40 L and 60% FiO2.  Medications: Reviewed on Rounds  Physical Exam:  Vitals: Pulse 99 respirations 20 P1 25/52 O2 sat 98% temp 98.6  Ventilator Settings high flow 40 L 60% FiO2  . General: Comfortable at this time . Eyes: Grossly normal lids, irises & conjunctiva . ENT: grossly tongue is normal . Neck: no obvious mass . Cardiovascular: S1 S2 normal no gallop . Respiratory: No rales or rhonchi noted . Abdomen: soft . Skin: no rash seen on limited exam . Musculoskeletal: not rigid . Psychiatric:unable to assess . Neurologic: no seizure no involuntary movements         Lab Data:   Basic Metabolic Panel: No results for input(s): NA, K, CL, CO2, GLUCOSE, BUN, CREATININE, CALCIUM, MG, PHOS in the last 168 hours.  ABG: No results for input(s): PHART, PCO2ART, PO2ART, HCO3, O2SAT in the last 168 hours.  Liver Function Tests: No results for input(s): AST, ALT, ALKPHOS, BILITOT, PROT, ALBUMIN in the last 168 hours. No results for input(s): LIPASE, AMYLASE in the last 168 hours. No results for input(s): AMMONIA in the last 168 hours.  CBC: No results for input(s): WBC, NEUTROABS, HGB, HCT, MCV, PLT in the last 168 hours.  Cardiac Enzymes: No results for input(s): CKTOTAL, CKMB, CKMBINDEX, TROPONINI in the last 168 hours.  BNP (last 3 results) No results for input(s): BNP in the last 8760 hours.  ProBNP (last 3 results) No results for input(s): PROBNP in the last 8760 hours.  Radiological Exams: Ct Chest W Contrast  Result Date: 09/23/2018 CLINICAL DATA:  Respiratory failure, poor aeration  of the left lung and prior placement of left pleural pigtail drainage catheter for drainage of pleural effusion. EXAM: CT CHEST WITH CONTRAST TECHNIQUE: Multidetector CT imaging of the chest was performed during intravenous contrast administration. CONTRAST:  65m OMNIPAQUE IOHEXOL 300 MG/ML  SOLN COMPARISON:  CT of the chest on 08/31/2018 and multiple recent chest x-ray studies. FINDINGS: Cardiovascular: The heart size is within normal limits. No pericardial fluid identified. Calcified coronary artery plaque again noted in the distribution of the LAD and left circumflex arteries and potentially also involving the left main coronary artery. The thoracic aorta and central pulmonary arteries are normal in caliber. The thoracic aorta is not felt to be aneurysmally dilated as suspected on the unenhanced CT previously with maximal diameter of approximately 3.9 cm. Mediastinum/Nodes: Stable calcified mediastinal and left hilar lymph nodes. There are some mildly prominent right hilar lymph nodes. Lungs/Pleura: Tracheostomy tube present. There is some debris/mucous in the distal trachea extending into both mainstem bronchi. Additional debris extends into the left lower lobe bronchus. There is narrowing of the left lower lobe bronchus and near occlusion of the left upper lobe bronchus. Extensive consolidation and atelectasis is noted of the left upper lobe and left lower lobe. The lingula shows less prominent atelectasis. There is only a small amount of residual left pleural fluid which is located near the indwelling posterior pigtail drainage catheter but also extends laterally, along the base of the major fissure and additional component near the apex. There is not a significant  component of undrained pleural fluid. Upper Abdomen: No acute abnormality. 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 IMPRESSION: 1. Extensive consolidation and atelectasis of the left upper lobe and left lower lobe. There is narrowing of the left lower lobe bronchus and near occlusion of the left upper lobe bronchus as well as debris and mucus in the distal trachea, bilateral mainstem bronchi and left lower lobe bronchus. 2. There is only a small amount of residual left pleural fluid with some located near the posterior pigtail drainage catheter and small component of loculation towards the apex. No pneumothorax. 3. The thoracic aorta is felt to be of normal caliber and not overtly dilated with maximal diameter of approximately 3.9 cm. 4. 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 Electronically Signed   By: Aletta Edouard M.D.   On: 09/23/2018 16:49   Dg Chest Port 1 View  Result Date: 09/24/2018 CLINICAL DATA:  Chest tube removal EXAM: PORTABLE CHEST 1 VIEW COMPARISON:  09/19/2018 FINDINGS: Left chest tube has been removed. No pneumothorax. Tracheostomy and right central line remain in place, unchanged. Slight improved aeration on the left with continued near complete opacification due to airspace disease and effusion. Right basilar atelectasis. Cardiomegaly. IMPRESSION: Interval removal of left chest tube without pneumothorax. Continued near complete opacification of the left hemithorax with slight improvement in aeration. Right base atelectasis. Electronically Signed   By: Rolm Baptise M.D.   On: 09/24/2018 10:46    Assessment/Plan Active Problems:   Acute on chronic respiratory failure with hypoxia (HCC)   Spinal cord injury, cervical region [facility])   TBI (traumatic brain injury) ([place])   Lobar pneumonia, unspecified organism ([place])   MRSA bacteremia   1. Acute on chronic respiratory failure with nausea patient can use to require increased oxygen.   Currently on 60% FiO2.  Continue on therapy and wean as tolerated.  Continue aggressive pulmonary toilet and secretion management 2. Spinal injury cervical region continue to monitor 3. Met brain injury unchanged 4. MR pneumonia continue present therapy 5. [place] bacteremia pneumonia continue to follow   I have personally seen and evaluated the patient, evaluated laboratory and imaging results, formulated the assessment and plan and placed orders. The Patient requires high complexity decision making for assessment and support.  Case was discussed on Rounds with the Respiratory Therapy Staff  Allyne Gee, MD [facility] Pulmonary Critical Care Medicine Sleep Medicine

## 2018-09-26 LAB — CBC
HCT: 28.5 % — ABNORMAL LOW (ref 39.0–52.0)
Hemoglobin: 8.6 g/dL — ABNORMAL LOW (ref 13.0–17.0)
MCH: 26.5 pg (ref 26.0–34.0)
MCHC: 30.2 g/dL (ref 30.0–36.0)
MCV: 87.7 fL (ref 80.0–100.0)
Platelets: 706 10*3/uL — ABNORMAL HIGH (ref 150–400)
RBC: 3.25 MIL/uL — ABNORMAL LOW (ref 4.22–5.81)
RDW: 15.1 % (ref 11.5–15.5)
WBC: 9.1 10*3/uL (ref 4.0–10.5)
nRBC: 0 % (ref 0.0–0.2)

## 2018-09-26 LAB — BASIC METABOLIC PANEL
Anion gap: 9 (ref 5–15)
BUN: 34 mg/dL — ABNORMAL HIGH (ref 8–23)
CO2: 27 mmol/L (ref 22–32)
Calcium: 9 mg/dL (ref 8.9–10.3)
Chloride: 100 mmol/L (ref 98–111)
Creatinine, Ser: 1.16 mg/dL (ref 0.61–1.24)
GFR calc Af Amer: >60 mL/min (ref >60)
GFR calc non Af Amer: >60 mL/min (ref >60)
Glucose, Bld: 103 mg/dL — ABNORMAL HIGH (ref 70–99)
Potassium: 4.3 mmol/L (ref 3.5–5.1)
Sodium: 136 mmol/L (ref 135–145)

## 2018-09-26 LAB — CULTURE, FUNGUS WITHOUT SMEAR

## 2018-09-26 NOTE — Progress Notes (Addendum)
Pulmonary Critical Care Medicine [facility] [place]   PULMONARY CRITICAL CARE SERVICE  PROGRESS NOTE  Date of Service: 09/26/2018  Wayne Buckley  DSK:[id number]  DOB: 14-Feb-1951   DOA: 07/25/2018  Referring Physician: Carron Curie, MD  HPI: Wayne Buckley is a 68 y.o. male seen for follow up of Acute on Chronic Respiratory Failure.  Patient remains on high flow nasal cannula currently set at 40 L and 60% FiO2.  Patient remained stable this time.  Medications: Reviewed on Rounds  Physical Exam:  Vitals: Pulse 99 respirations 19 BP one 2/67 O2 sat 99% temp 99.8  Ventilator Settings heated high flow 40 L and 60% FiO2  . General: Comfortable at this time . Eyes: Grossly normal lids, irises & conjunctiva . ENT: grossly tongue is normal . Neck: no obvious mass . Cardiovascular: S1 S2 normal no gallop . Respiratory: No rales or rhonchi noted . Abdomen: soft . Skin: no rash seen on limited exam . Musculoskeletal: not rigid . Psychiatric:unable to assess . Neurologic: no seizure no involuntary movements         Lab Data:   Basic Metabolic Panel: Recent Labs  Lab 09/26/18 0637  NA 136  K 4.3  CL 100  CO2 27  GLUCOSE 103*  BUN 34*  CREATININE 1.16  CALCIUM 9.0    ABG: No results for input(s): PHART, PCO2ART, PO2ART, HCO3, O2SAT in the last 168 hours.  Liver Function Tests: No results for input(s): AST, ALT, ALKPHOS, BILITOT, PROT, ALBUMIN in the last 168 hours. No results for input(s): LIPASE, AMYLASE in the last 168 hours. No results for input(s): AMMONIA in the last 168 hours.  CBC: Recent Labs  Lab 09/26/18 0637  WBC 9.1  HGB 8.6*  HCT 28.5*  MCV 87.7  PLT 706*    Cardiac Enzymes: No results for input(s): CKTOTAL, CKMB, CKMBINDEX, TROPONINI in the last 168 hours.  BNP (last 3 results) No results for input(s): BNP in the last 8760 hours.  ProBNP (last 3 results) No results for input(s): PROBNP in the last 8760 hours.  Radiological Exams: No  results found.  Assessment/Plan Active Problems:   Acute on chronic respiratory failure with hypoxia (HCC)   Spinal cord injury, [facility] region [facility])   TBI (traumatic brain injury) (HCC)   Lobar pneumonia, unspecified organism (HCC)   MRSA bacteremia   1. Acute on chronic respiratory failure with hypoxia continue heated high flow at this time.  Currently requiring 60% FiO2.  Continue pulmonary toilet and secretion management 2. Spinal injury cervical region continue to monitor 3. Traumatic brain injury unchanged 4. Lobar pneumonia continue present therapy 5. MRSA bacteremia pneumonia treated continue to follow   I have personally seen and evaluated the patient, evaluated laboratory and imaging results, formulated the assessment and plan and placed orders. The Patient requires high complexity decision making for assessment and support.  Case was discussed on Rounds with the Respiratory Therapy Staff  Yevonne Pax, MD [facility] Pulmonary Critical Care Medicine Sleep Medicine

## 2018-09-27 ENCOUNTER — Other Ambulatory Visit (HOSPITAL_COMMUNITY): Payer: Self-pay

## 2018-09-27 NOTE — Progress Notes (Addendum)
Pulmonary Critical Care Medicine [facility] [place]   PULMONARY CRITICAL CARE SERVICE  PROGRESS NOTE  Date of Service: 09/27/2018  HATIM KUECKER  MOQ:[id number]  DOB: 1950/10/10   DOA: 07/25/2018  Referring Physician: Carron Curie, MD  HPI: NEVAN TEWALT is a 68 y.o. male seen for follow up of Acute on Chronic Respiratory Failure.  Patient remained heated high flow nasal cannula 40 L 100% FiO2.  Patient has a large amount of secretions and is requiring frequent suctioning.  Medications: Reviewed on Rounds  Physical Exam:  Vitals: Pulse 98 respirations 20 BP 133/79 O2 sat 95% temp 95.4  Ventilator Settings heated high flow 40 L 100% FiO2  . General: Comfortable at this time . Eyes: Grossly normal lids, irises & conjunctiva . ENT: grossly tongue is normal . Neck: no obvious mass . Cardiovascular: S1 S2 normal no gallop . Respiratory: No rales or rhonchi noted . Abdomen: soft . Skin: no rash seen on limited exam . Musculoskeletal: not rigid . Psychiatric:unable to assess . Neurologic: no seizure no involuntary movements         Lab Data:   Basic Metabolic Panel: Recent Labs  Lab 09/26/18 0637  NA 136  K 4.3  CL 100  CO2 27  GLUCOSE 103*  BUN 34*  CREATININE 1.16  CALCIUM 9.0    ABG: No results for input(s): PHART, PCO2ART, PO2ART, HCO3, O2SAT in the last 168 hours.  Liver Function Tests: No results for input(s): AST, ALT, ALKPHOS, BILITOT, PROT, ALBUMIN in the last 168 hours. No results for input(s): LIPASE, AMYLASE in the last 168 hours. No results for input(s): AMMONIA in the last 168 hours.  CBC: Recent Labs  Lab 09/26/18 0637  WBC 9.1  HGB 8.6*  HCT 28.5*  MCV 87.7  PLT 706*    Cardiac Enzymes: No results for input(s): CKTOTAL, CKMB, CKMBINDEX, TROPONINI in the last 168 hours.  BNP (last 3 results) No results for input(s): BNP in the last 8760 hours.  ProBNP (last 3 results) No results for input(s): PROBNP in the last 8760  hours.  Radiological Exams: Dg Chest Port 1 View  Result Date: 09/27/2018 CLINICAL DATA:  Pneumonia EXAM: PORTABLE CHEST 1 VIEW COMPARISON:  09/24/2018 FINDINGS: Tracheostomy tube in satisfactory position. Interval removal of a right-sided central venous catheter. Stable left lower lobe airspace disease and moderate left pleural effusion. Pleural effusion appears to be partially loculated. No pneumothorax. Stable cardiomediastinal silhouette. No aggressive osseous lesion. IMPRESSION: Stable left lower lobe airspace disease and moderate left pleural effusion. Pleural effusion appears to be partially loculated. Electronically Signed   By: Elige Ko   On: 09/27/2018 13:02    Assessment/Plan Active Problems:   Acute on chronic respiratory failure with hypoxia (HCC)   Spinal cord injury, cervical region [facility])   TBI (traumatic brain injury) (HCC)   Lobar pneumonia, unspecified organism (HCC)   MRSA bacteremia   1. Acute on chronic respiratory failure with hypoxia continue heated high flow at this time.  Current requiring 100% FiO2.  Will likely be able to wean this down today.  Continue pulmonary toilet and secretion management. 2. Spinal injury cervical region continue to monitor 3. Traumatic brain injury unchanged 4. Lobar pneumonia continue present therapy 5. MRSA bacteremia pneumonia treated follow   I have personally seen and evaluated the patient, evaluated laboratory and imaging results, formulated the assessment and plan and placed orders. The Patient requires high complexity decision making for assessment and support.  Case was discussed on  Rounds with the Respiratory Therapy Staff  Allyne Gee, MD [facility] Pulmonary Critical Care Medicine Sleep Medicine

## 2018-09-28 NOTE — Progress Notes (Addendum)
Pulmonary Critical Care Medicine [facility] [place]   PULMONARY CRITICAL CARE SERVICE  PROGRESS NOTE  Date of Service: 09/28/2018  Wayne Buckley  BHA:[id number]  DOB: December 09, 1950   DOA: 07/25/2018  Referring Physician: Carron Curie, MD  HPI: Wayne Buckley is a 68 y.o. male seen for follow up of Acute on Chronic Respiratory Failure.  Patient remains on heated high flow nasal cannula 40 L and 70% FiO2.  Continue to have a large amount of secretions.  Medications: Reviewed on Rounds  Physical Exam:  Vitals: Pulse 114 respirations 22 BP 149/66 O2 sat 95% 76  Ventilator Settings heated high flow 40 L and 70% FiO2  . General: Comfortable at this time . Eyes: Grossly normal lids, irises & conjunctiva . ENT: grossly tongue is normal . Neck: no obvious mass . Cardiovascular: S1 S2 normal no gallop . Respiratory: No rales or rhonchi noted . Abdomen: soft . Skin: no rash seen on limited exam . Musculoskeletal: not rigid . Psychiatric:unable to assess . Neurologic: no seizure no involuntary movements         Lab Data:   Basic Metabolic Panel: Recent Labs  Lab 09/26/18 0637  NA 136  K 4.3  CL 100  CO2 27  GLUCOSE 103*  BUN 34*  CREATININE 1.16  CALCIUM 9.0    ABG: No results for input(s): PHART, PCO2ART, PO2ART, HCO3, O2SAT in the last 168 hours.  Liver Function Tests: No results for input(s): AST, ALT, ALKPHOS, BILITOT, PROT, ALBUMIN in the last 168 hours. No results for input(s): LIPASE, AMYLASE in the last 168 hours. No results for input(s): AMMONIA in the last 168 hours.  CBC: Recent Labs  Lab 09/26/18 0637  WBC 9.1  HGB 8.6*  HCT 28.5*  MCV 87.7  PLT 706*    Cardiac Enzymes: No results for input(s): CKTOTAL, CKMB, CKMBINDEX, TROPONINI in the last 168 hours.  BNP (last 3 results) No results for input(s): BNP in the last 8760 hours.  ProBNP (last 3 results) No results for input(s): PROBNP in the last 8760 hours.  Radiological Exams: Dg Chest  Port 1 View  Result Date: 09/27/2018 CLINICAL DATA:  Pneumonia EXAM: PORTABLE CHEST 1 VIEW COMPARISON:  09/24/2018 FINDINGS: Tracheostomy tube in satisfactory position. Interval removal of a right-sided central venous catheter. Stable left lower lobe airspace disease and moderate left pleural effusion. Pleural effusion appears to be partially loculated. No pneumothorax. Stable cardiomediastinal silhouette. No aggressive osseous lesion. IMPRESSION: Stable left lower lobe airspace disease and moderate left pleural effusion. Pleural effusion appears to be partially loculated. Electronically Signed   By: Elige Ko   On: 09/27/2018 13:02    Assessment/Plan Active Problems:   Acute on chronic respiratory failure with hypoxia (HCC)   Spinal cord injury, cervical region [facility])   TBI (traumatic brain injury) (HCC)   Lobar pneumonia, unspecified organism (HCC)   MRSA bacteremia   1. Acute on chronic respiratory failure with hypoxia continue heated high flow at this time.  Currently requiring 70% FiO2 we will continue to attempt weaning today.  Continue pulmonary toilet and secretion management 2. Spinal injury cervical region continue monitor 3. Traumatic brain injury unchanged 4. Lobar pneumonia continue present therapy 5. MRSA bacteremia pneumonia treated continue to follow   I have personally seen and evaluated the patient, evaluated laboratory and imaging results, formulated the assessment and plan and placed orders. The Patient requires high complexity decision making for assessment and support.  Case was discussed on Rounds with the Respiratory  Therapy Staff  Allyne Gee, MD [facility] Pulmonary Critical Care Medicine Sleep Medicine

## 2018-09-29 ENCOUNTER — Inpatient Hospital Stay (HOSPITAL_COMMUNITY)
Admission: RE | Admit: 2018-09-29 | Discharge: 2018-10-21 | Disposition: A | Discharge status: Skilled Nursing Facility | Payer: Medicare Other | Attending: Internal Medicine | Admitting: Internal Medicine

## 2018-09-29 DIAGNOSIS — S069XAA Unspecified intracranial injury with loss of consciousness status unknown, initial encounter: Secondary | ICD-10-CM | POA: Diagnosis present

## 2018-09-29 DIAGNOSIS — S069X9A Unspecified intracranial injury with loss of consciousness of unspecified duration, initial encounter: Secondary | ICD-10-CM | POA: Diagnosis present

## 2018-09-29 DIAGNOSIS — S14109A Unspecified injury at unspecified level of cervical spinal cord, initial encounter: Secondary | ICD-10-CM | POA: Diagnosis present

## 2018-09-29 DIAGNOSIS — J181 Lobar pneumonia, unspecified organism: Secondary | ICD-10-CM | POA: Diagnosis present

## 2018-09-29 DIAGNOSIS — J9811 Atelectasis: Secondary | ICD-10-CM

## 2018-09-29 DIAGNOSIS — R509 Fever, unspecified: Secondary | ICD-10-CM

## 2018-09-29 DIAGNOSIS — J189 Pneumonia, unspecified organism: Secondary | ICD-10-CM

## 2018-09-29 DIAGNOSIS — J9621 Acute and chronic respiratory failure with hypoxia: Secondary | ICD-10-CM | POA: Diagnosis present

## 2018-09-29 NOTE — Progress Notes (Addendum)
Pulmonary Critical Care Medicine [facility] [place]   PULMONARY CRITICAL CARE SERVICE  PROGRESS NOTE  Date of Service: 09/29/2018  MOUSSA KOWALCHUK  PTW:[id number]  DOB: August 02, 1950   DOA: 07/25/2018  Referring Physician: Carron Curie, MD  HPI: Wayne Buckley is a 68 y.o. male seen for follow up of Acute on Chronic Respiratory Failure.  Patient remains on heated high flow nasal cannula 40 L and 70% FiO2.  No distress at this time.  Medications: Reviewed on Rounds  Physical Exam:  Vitals: Pulse 98 respirations 22 BP 115/63 O2 sat 98% temp 99.8  Ventilator Settings heated high flow nasal cannula 40 L and 70% FiO2  . General: Comfortable at this time . Eyes: Grossly normal lids, irises & conjunctiva . ENT: grossly tongue is normal . Neck: no obvious mass . Cardiovascular: S1 S2 normal no gallop . Respiratory: No rales rhonchi noted . Abdomen: soft . Skin: no rash seen on limited exam . Musculoskeletal: not rigid . Psychiatric:unable to assess . Neurologic: no seizure no involuntary movements         Lab Data:   Basic Metabolic Panel: Recent Labs  Lab 09/26/18 0637  NA 136  K 4.3  CL 100  CO2 27  GLUCOSE 103*  BUN 34*  CREATININE 1.16  CALCIUM 9.0    ABG: No results for input(s): PHART, PCO2ART, PO2ART, HCO3, O2SAT in the last 168 hours.  Liver Function Tests: No results for input(s): AST, ALT, ALKPHOS, BILITOT, PROT, ALBUMIN in the last 168 hours. No results for input(s): LIPASE, AMYLASE in the last 168 hours. No results for input(s): AMMONIA in the last 168 hours.  CBC: Recent Labs  Lab 09/26/18 0637  WBC 9.1  HGB 8.6*  HCT 28.5*  MCV 87.7  PLT 706*    Cardiac Enzymes: No results for input(s): CKTOTAL, CKMB, CKMBINDEX, TROPONINI in the last 168 hours.  BNP (last 3 results) No results for input(s): BNP in the last 8760 hours.  ProBNP (last 3 results) No results for input(s): PROBNP in the last 8760 hours.  Radiological Exams: No results  found.  Assessment/Plan Active Problems:   Acute on chronic respiratory failure with hypoxia (HCC)   Spinal cord injury, cervical region [facility])   TBI (traumatic brain injury) (HCC)   Lobar pneumonia, unspecified organism (HCC)   MRSA bacteremia   1. Acute on chronic respiratory failure with hypoxia continue with the heated high flow at this time.  Continue to titrate oxygen as patient can tolerate.  Continue pulmonary toilet aspiration management 2. Spinal cord injury cervical region continue to monitor 3. Traumatic brain injury unchanged 4. Lobar pneumonia continue present epi 5. MRSA bacteremia pneumonia treated continue to follow   I have personally seen and evaluated the patient, evaluated laboratory and imaging results, formulated the assessment and plan and placed orders. The Patient requires high complexity decision making for assessment and support.  Case was discussed on Rounds with the Respiratory Therapy Staff  Yevonne Pax, MD [facility] Pulmonary Critical Care Medicine Sleep Medicine

## 2018-09-30 ENCOUNTER — Encounter: Payer: Self-pay | Admitting: Internal Medicine

## 2018-09-30 ENCOUNTER — Other Ambulatory Visit (HOSPITAL_COMMUNITY): Payer: Self-pay

## 2018-09-30 DIAGNOSIS — J9621 Acute and chronic respiratory failure with hypoxia: Secondary | ICD-10-CM

## 2018-09-30 DIAGNOSIS — S069X5S Unspecified intracranial injury with loss of consciousness greater than 24 hours with return to pre-existing conscious level, sequela: Secondary | ICD-10-CM

## 2018-09-30 DIAGNOSIS — S14109S Unspecified injury at unspecified level of cervical spinal cord, sequela: Secondary | ICD-10-CM

## 2018-09-30 DIAGNOSIS — J181 Lobar pneumonia, unspecified organism: Secondary | ICD-10-CM | POA: Diagnosis present

## 2018-09-30 LAB — URINALYSIS, ROUTINE W REFLEX MICROSCOPIC
Bilirubin Urine: NEGATIVE
Glucose, UA: NEGATIVE mg/dL
Ketones, ur: NEGATIVE mg/dL
Nitrite: NEGATIVE
Protein, ur: 30 mg/dL — AB
RBC / HPF: >50 RBC/hpf — ABNORMAL HIGH (ref 0–5)
Specific Gravity, Urine: 1.017 (ref 1.005–1.030)
WBC, UA: >50 WBC/hpf — ABNORMAL HIGH (ref 0–5)
pH: 6 (ref 5.0–8.0)

## 2018-09-30 LAB — CBC
HCT: 26 % — ABNORMAL LOW (ref 39.0–52.0)
Hemoglobin: 8.1 g/dL — ABNORMAL LOW (ref 13.0–17.0)
MCH: 27.1 pg (ref 26.0–34.0)
MCHC: 31.2 g/dL (ref 30.0–36.0)
MCV: 87 fL (ref 80.0–100.0)
Platelets: 685 10*3/uL — ABNORMAL HIGH (ref 150–400)
RBC: 2.99 MIL/uL — ABNORMAL LOW (ref 4.22–5.81)
RDW: 15.4 % (ref 11.5–15.5)
WBC: 13.3 10*3/uL — ABNORMAL HIGH (ref 4.0–10.5)
nRBC: 0 % (ref 0.0–0.2)

## 2018-09-30 LAB — BASIC METABOLIC PANEL
Anion gap: 13 (ref 5–15)
BUN: 41 mg/dL — ABNORMAL HIGH (ref 8–23)
CO2: 26 mmol/L (ref 22–32)
Calcium: 9.2 mg/dL (ref 8.9–10.3)
Chloride: 100 mmol/L (ref 98–111)
Creatinine, Ser: 1.35 mg/dL — ABNORMAL HIGH (ref 0.61–1.24)
GFR calc Af Amer: >60 mL/min (ref >60)
GFR calc non Af Amer: 54 mL/min — ABNORMAL LOW (ref >60)
Glucose, Bld: 105 mg/dL — ABNORMAL HIGH (ref 70–99)
Potassium: 4.4 mmol/L (ref 3.5–5.1)
Sodium: 139 mmol/L (ref 135–145)

## 2018-09-30 LAB — CULTURE, RESPIRATORY W GRAM STAIN

## 2018-09-30 LAB — CULTURE, RESPIRATORY

## 2018-09-30 LAB — MAGNESIUM: Magnesium: 2.3 mg/dL (ref 1.7–2.4)

## 2018-09-30 NOTE — Progress Notes (Addendum)
Pulmonary Critical Care Medicine [facility] [place]   PULMONARY CRITICAL CARE SERVICE  PROGRESS NOTE  Date of Service: 09/30/2018  ETHANMICHAEL FLORES  PJS:[id number]  DOB: 19-Oct-1950   DOA: 09/29/2018  Referring Physician: Carron Curie, MD  HPI: KEERAN PREVAL is a 69 y.o. male seen for follow up of Acute on Chronic Respiratory Failure.  Patient is doing well on aerosol trach collar 40% FiO2 at this time.  He is requiring hourly suctioning due to copious amounts of secretions.  Medications: Reviewed on Rounds  Physical Exam:  Vitals: Pulse 122 respirations 21 BP 141/71 O2 sat 96% temp 98.9  Ventilator Settings aerosol trach collar 40%  . General: Comfortable at this time . Eyes: Grossly normal lids, irises & conjunctiva . ENT: grossly tongue is normal . Neck: no obvious mass . Cardiovascular: S1 S2 normal no gallop . Respiratory: No rales or rhonchi noted . Abdomen: soft . Skin: no rash seen on limited exam . Musculoskeletal: not rigid . Psychiatric:unable to assess . Neurologic: no seizure no involuntary movements         Lab Data:   Basic Metabolic Panel: Recent Labs  Lab 09/26/18 0637 09/30/18 0602  NA 136 139  K 4.3 4.4  CL 100 100  CO2 27 26  GLUCOSE 103* 105*  BUN 34* 41*  CREATININE 1.16 1.35*  CALCIUM 9.0 9.2  MG  --  2.3    ABG: No results for input(s): PHART, PCO2ART, PO2ART, HCO3, O2SAT in the last 168 hours.  Liver Function Tests: No results for input(s): AST, ALT, ALKPHOS, BILITOT, PROT, ALBUMIN in the last 168 hours. No results for input(s): LIPASE, AMYLASE in the last 168 hours. No results for input(s): AMMONIA in the last 168 hours.  CBC: Recent Labs  Lab 09/26/18 0637 09/30/18 0602  WBC 9.1 13.3*  HGB 8.6* 8.1*  HCT 28.5* 26.0*  MCV 87.7 87.0  PLT 706* 685*    Cardiac Enzymes: No results for input(s): CKTOTAL, CKMB, CKMBINDEX, TROPONINI in the last 168 hours.  BNP (last 3 results) No results for input(s): BNP in the last  8760 hours.  ProBNP (last 3 results) No results for input(s): PROBNP in the last 8760 hours.  Radiological Exams: Dg Chest Port 1 View  Result Date: 09/30/2018 CLINICAL DATA:  Fever.  Pneumonia. EXAM: PORTABLE CHEST 1 VIEW COMPARISON:  09/27/2018 FINDINGS: The heart is obscured by left lung airspace disease. Extensive opacification of left hemithorax is similar the prior study. Moderate sized effusion is associated. Tracheostomy tube is stable. New right lower lobe airspace disease is evident. IMPRESSION: 1. New right lower lobe airspace disease. 2. Stable complex left sided pneumonia and effusion. Electronically Signed   By: Marin Roberts M.D.   On: 09/30/2018 08:01    Assessment/Plan Active Problems:   Acute on chronic respiratory failure with hypoxia (HCC)   TBI (traumatic brain injury) (HCC)   Lobar pneumonia, unspecified organism (HCC)   Spinal cord injury, cervical region (HCC)   1. Acute on chronic respiratory failure with hypoxia continue with 40% aerosol trach collar at this time.  Continue pulmonary toilet and secretion management 2. Spinal cord injury cervical region continue to monitor 3. Traumatic brain injury unchanged 4. Lobar pneumonia continue present management 5. MRSA bacteremia pneumonia treated continue to follow   I have personally seen and evaluated the patient, evaluated laboratory and imaging results, formulated the assessment and plan and placed orders. The Patient requires high complexity decision making for assessment and support.  Case  was discussed on Rounds with the Respiratory Therapy Staff  Allyne Gee, MD [facility] Pulmonary Critical Care Medicine Sleep Medicine

## 2018-10-01 LAB — BLOOD GAS, ARTERIAL
Acid-Base Excess: 0.4 mmol/L (ref 0.0–2.0)
Bicarbonate: 25.3 mmol/L (ref 20.0–28.0)
FIO2: 1
O2 Saturation: 99.4 %
Patient temperature: 98.6
pCO2 arterial: 46.9 mmHg (ref 32.0–48.0)
pH, Arterial: 7.352 (ref 7.350–7.450)
pO2, Arterial: 237 mmHg — ABNORMAL HIGH (ref 83.0–108.0)

## 2018-10-01 LAB — ACID FAST SMEAR (AFB, MYCOBACTERIA): Acid Fast Smear: NEGATIVE

## 2018-10-01 LAB — URINE CULTURE: Culture: 20000 — AB

## 2018-10-01 NOTE — Progress Notes (Addendum)
Pulmonary Critical Care Medicine [facility] [place]   PULMONARY CRITICAL CARE SERVICE  PROGRESS NOTE  Date of Service: 10/01/2018  Wayne Buckley  [id number]:[id number]  DOB: Sep 02, 1950   DOA: 09/29/2018  Referring Physician: Carron Curie, MD  HPI: Wayne Buckley is a 68 y.o. male seen for follow up of Acute on Chronic Respiratory Failure.  Patient is on aerosol trach collar and has managed to come down to 35% FiO2.  Currently doing well.  Medications: Reviewed on Rounds  Physical Exam:  Vitals: Pulse 97 respirations 20 BP 129/66 O2 sat 100% temp 99.6  Ventilator Settings ATC 35%  . General: Comfortable at this time . Eyes: Grossly normal lids, irises & conjunctiva . ENT: grossly tongue is normal . Neck: no obvious mass . Cardiovascular: S1 S2 normal no gallop . Respiratory: No rales or rhonchi noted . Abdomen: soft . Skin: no rash seen on limited exam . Musculoskeletal: not rigid . Psychiatric:unable to assess . Neurologic: no seizure no involuntary movements         Lab Data:   Basic Metabolic Panel: Recent Labs  Lab 09/26/18 0637 09/30/18 0602  NA 136 139  K 4.3 4.4  CL 100 100  CO2 27 26  GLUCOSE 103* 105*  BUN 34* 41*  CREATININE 1.16 1.35*  CALCIUM 9.0 9.2  MG  --  2.3    ABG: Recent Labs  Lab 09/30/18 2345  PHART 7.352  PCO2ART 46.9  PO2ART 237*  HCO3 25.3  O2SAT 99.4    Liver Function Tests: No results for input(s): AST, ALT, ALKPHOS, BILITOT, PROT, ALBUMIN in the last 168 hours. No results for input(s): LIPASE, AMYLASE in the last 168 hours. No results for input(s): AMMONIA in the last 168 hours.  CBC: Recent Labs  Lab 09/26/18 0637 09/30/18 0602  WBC 9.1 13.3*  HGB 8.6* 8.1*  HCT 28.5* 26.0*  MCV 87.7 87.0  PLT 706* 685*    Cardiac Enzymes: No results for input(s): CKTOTAL, CKMB, CKMBINDEX, TROPONINI in the last 168 hours.  BNP (last 3 results) No results for input(s): BNP in the last 8760 hours.  ProBNP (last 3  results) No results for input(s): PROBNP in the last 8760 hours.  Radiological Exams: Dg Chest Port 1 View  Result Date: 09/30/2018 CLINICAL DATA:  Fever.  Pneumonia. EXAM: PORTABLE CHEST 1 VIEW COMPARISON:  09/27/2018 FINDINGS: The heart is obscured by left lung airspace disease. Extensive opacification of left hemithorax is similar the prior study. Moderate sized effusion is associated. Tracheostomy tube is stable. New right lower lobe airspace disease is evident. IMPRESSION: 1. New right lower lobe airspace disease. 2. Stable complex left sided pneumonia and effusion. Electronically Signed   By: Marin Roberts M.D.   On: 09/30/2018 08:01    Assessment/Plan Active Problems:   Acute on chronic respiratory failure with hypoxia (HCC)   TBI (traumatic brain injury) (HCC)   Lobar pneumonia, unspecified organism (HCC)   Spinal cord injury, cervical region (HCC)   1. Acute on chronic respiratory failure with hypoxia continue with 30% aerosol trach collar at this time.  Continue pulmonary toilet and secretion management 2. Spinal cord injury cervical region continue to monitor 3. Traumatic brain injury unchanged 4. Lobar pneumonia continue present management 5. MRSA bacteremia pneumonia continue to follow   I have personally seen and evaluated the patient, evaluated laboratory and imaging results, formulated the assessment and plan and placed orders. The Patient requires high complexity decision making for assessment and support.  Case  was discussed on Rounds with the Respiratory Therapy Staff  Allyne Gee, MD [facility] Pulmonary Critical Care Medicine Sleep Medicine

## 2018-10-02 NOTE — Progress Notes (Addendum)
Pulmonary Critical Care Medicine [facility] [place]   PULMONARY CRITICAL CARE SERVICE  PROGRESS NOTE  Date of Service: 10/02/2018  Wayne Buckley  KTG:[id number]  DOB: Jan 10, 1951   DOA: 09/29/2018  Referring Physician: Carron Curie, MD  HPI: Wayne Buckley is a 68 y.o. male seen for follow up of Acute on Chronic Respiratory Failure.  Patient remains on aerosol trach collar 30% FiO2 at this time.  Patient is afebrile with no distress noted at this time.  Medications: Reviewed on Rounds  Physical Exam:  Vitals: Pulse 102 respirations 28 BP 157/54 O2 sat 97% temp 98.4  Ventilator Settings ATC 30%  . General: Comfortable at this time . Eyes: Grossly normal lids, irises & conjunctiva . ENT: grossly tongue is normal . Neck: no obvious mass . Cardiovascular: S1 S2 normal no gallop . Respiratory: No rales or rhonchi noted . Abdomen: soft . Skin: no rash seen on limited exam . Musculoskeletal: not rigid . Psychiatric:unable to assess . Neurologic: no seizure no involuntary movements         Lab Data:   Basic Metabolic Panel: Recent Labs  Lab 09/26/18 0637 09/30/18 0602  NA 136 139  K 4.3 4.4  CL 100 100  CO2 27 26  GLUCOSE 103* 105*  BUN 34* 41*  CREATININE 1.16 1.35*  CALCIUM 9.0 9.2  MG  --  2.3    ABG: Recent Labs  Lab 09/30/18 2345  PHART 7.352  PCO2ART 46.9  PO2ART 237*  HCO3 25.3  O2SAT 99.4    Liver Function Tests: No results for input(s): AST, ALT, ALKPHOS, BILITOT, PROT, ALBUMIN in the last 168 hours. No results for input(s): LIPASE, AMYLASE in the last 168 hours. No results for input(s): AMMONIA in the last 168 hours.  CBC: Recent Labs  Lab 09/26/18 0637 09/30/18 0602  WBC 9.1 13.3*  HGB 8.6* 8.1*  HCT 28.5* 26.0*  MCV 87.7 87.0  PLT 706* 685*    Cardiac Enzymes: No results for input(s): CKTOTAL, CKMB, CKMBINDEX, TROPONINI in the last 168 hours.  BNP (last 3 results) No results for input(s): BNP in the last 8760  hours.  ProBNP (last 3 results) No results for input(s): PROBNP in the last 8760 hours.  Radiological Exams: No results found.  Assessment/Plan Active Problems:   Acute on chronic respiratory failure with hypoxia (HCC)   TBI (traumatic brain injury) (HCC)   Lobar pneumonia, unspecified organism (HCC)   Spinal cord injury, cervical region (HCC)   1. Acute on chronic respiratory failure with hypoxia continue with aerosol trach collar 30% FiO2 at this time.  Continue secretion management and pulmonary toilet. 2. Spinal cord injury cervical region continue to monitor 3. Traumatic brain injury unchanged 4. Lobar pneumonia continue present management 5. MRSA bacteremia pneumonia continue to follow   I have personally seen and evaluated the patient, evaluated laboratory and imaging results, formulated the assessment and plan and placed orders. The Patient requires high complexity decision making for assessment and support.  Case was discussed on Rounds with the Respiratory Therapy Staff  Yevonne Pax, MD [facility] Pulmonary Critical Care Medicine Sleep Medicine

## 2018-10-03 LAB — VANCOMYCIN, TROUGH: Vancomycin Tr: 25 ug/mL (ref 15–20)

## 2018-10-03 NOTE — Progress Notes (Addendum)
Pulmonary Critical Care Medicine [facility] [place]   PULMONARY CRITICAL CARE SERVICE  PROGRESS NOTE  Date of Service: 10/03/2018  Wayne Buckley  NID:[id number]  DOB: 07-03-1950   DOA: 09/29/2018  Referring Physician: Carron Curie, MD  HPI: Wayne Buckley is a 68 y.o. male seen for follow up of Acute on Chronic Respiratory Failure.  Patient remains on aerosol trach collar 35% FiO2 at this time.  A rapid response was called overnight due to thickened secretions and a mucous plug.  Patient is doing well at this time.  Medications: Reviewed on Rounds  Physical Exam:  Vitals: Pulse 102 respirations 24 BP 141/84 O2 sat 97% temp 99.0  Ventilator Settings ATC 35%  . General: Comfortable at this time . Eyes: Grossly normal lids, irises & conjunctiva . ENT: grossly tongue is normal . Neck: no obvious mass . Cardiovascular: S1 S2 normal no gallop . Respiratory: No rales or rhonchi noted . Abdomen: soft . Skin: no rash seen on limited exam . Musculoskeletal: not rigid . Psychiatric:unable to assess . Neurologic: no seizure no involuntary movements         Lab Data:   Basic Metabolic Panel: Recent Labs  Lab 09/30/18 0602  NA 139  K 4.4  CL 100  CO2 26  GLUCOSE 105*  BUN 41*  CREATININE 1.35*  CALCIUM 9.2  MG 2.3    ABG: Recent Labs  Lab 09/30/18 2345  PHART 7.352  PCO2ART 46.9  PO2ART 237*  HCO3 25.3  O2SAT 99.4    Liver Function Tests: No results for input(s): AST, ALT, ALKPHOS, BILITOT, PROT, ALBUMIN in the last 168 hours. No results for input(s): LIPASE, AMYLASE in the last 168 hours. No results for input(s): AMMONIA in the last 168 hours.  CBC: Recent Labs  Lab 09/30/18 0602  WBC 13.3*  HGB 8.1*  HCT 26.0*  MCV 87.0  PLT 685*    Cardiac Enzymes: No results for input(s): CKTOTAL, CKMB, CKMBINDEX, TROPONINI in the last 168 hours.  BNP (last 3 results) No results for input(s): BNP in the last 8760 hours.  ProBNP (last 3 results) No  results for input(s): PROBNP in the last 8760 hours.  Radiological Exams: No results found.  Assessment/Plan Active Problems:   Acute on chronic respiratory failure with hypoxia (HCC)   TBI (traumatic brain injury) (HCC)   Lobar pneumonia, unspecified organism (HCC)   Spinal cord injury, cervical region (HCC)   1. Acute on chronic respiratory failure with hypoxia continue with aerosol trach collar 35% FiO2 at this time.  Continue pulmonary toilet and secretion management. 2. Spinal cord injury cervical region continue to monitor 3. Traumatic brain injury unchanged 4. Lobar pneumonia continue present management 5. MRSA bacteremia pneumonia continue to follow   I have personally seen and evaluated the patient, evaluated laboratory and imaging results, formulated the assessment and plan and placed orders. The Patient requires high complexity decision making for assessment and support.  Case was discussed on Rounds with the Respiratory Therapy Staff  Yevonne Pax, MD [facility] Pulmonary Critical Care Medicine Sleep Medicine

## 2018-10-04 LAB — CULTURE, BLOOD (ROUTINE X 2)
Culture: NO GROWTH
Culture: NO GROWTH
Special Requests: ADEQUATE
Special Requests: ADEQUATE

## 2018-10-04 LAB — VANCOMYCIN, TROUGH: Vancomycin Tr: 18 ug/mL (ref 15–20)

## 2018-10-04 NOTE — Progress Notes (Addendum)
Pulmonary Critical Care Medicine [facility] [place]   PULMONARY CRITICAL CARE SERVICE  PROGRESS NOTE  Date of Service: 10/04/2018  Wayne Buckley  [id number]:[id number]  DOB: 1950/11/25   DOA: 09/29/2018  Referring Physician: Carron Curie, MD  HPI: Wayne Buckley is a 68 y.o. male seen for follow up of Acute on Chronic Respiratory Failure.  Patient remains on aerosol trach collar 35% FiO2.  Continues to have copious amounts of secretions.  Satting 100% at this time.    Medications: Reviewed on Rounds  Physical Exam:  Vitals: Pulse 95 respirations 15 BP 153/60 O2 sat 100% temp 98.6  Ventilator Settings ATC 35%  . General: Comfortable at this time . Eyes: Grossly normal lids, irises & conjunctiva . ENT: grossly tongue is normal . Neck: no obvious mass . Cardiovascular: S1 S2 normal no gallop . Respiratory: No rales or rhonchi noted . Abdomen: soft . Skin: no rash seen on limited exam . Musculoskeletal: not rigid . Psychiatric:unable to assess . Neurologic: no seizure no involuntary movements         Lab Data:   Basic Metabolic Panel: Recent Labs  Lab 09/30/18 0602  NA 139  K 4.4  CL 100  CO2 26  GLUCOSE 105*  BUN 41*  CREATININE 1.35*  CALCIUM 9.2  MG 2.3    ABG: Recent Labs  Lab 09/30/18 2345  PHART 7.352  PCO2ART 46.9  PO2ART 237*  HCO3 25.3  O2SAT 99.4    Liver Function Tests: No results for input(s): AST, ALT, ALKPHOS, BILITOT, PROT, ALBUMIN in the last 168 hours. No results for input(s): LIPASE, AMYLASE in the last 168 hours. No results for input(s): AMMONIA in the last 168 hours.  CBC: Recent Labs  Lab 09/30/18 0602  WBC 13.3*  HGB 8.1*  HCT 26.0*  MCV 87.0  PLT 685*    Cardiac Enzymes: No results for input(s): CKTOTAL, CKMB, CKMBINDEX, TROPONINI in the last 168 hours.  BNP (last 3 results) No results for input(s): BNP in the last 8760 hours.  ProBNP (last 3 results) No results for input(s): PROBNP in the last 8760  hours.  Radiological Exams: No results found.  Assessment/Plan Active Problems:   Acute on chronic respiratory failure with hypoxia (HCC)   TBI (traumatic brain injury) (HCC)   Lobar pneumonia, unspecified organism (HCC)   Spinal cord injury, cervical region (HCC)   1. Acute on chronic respiratory failure with hypoxia continue with aerosol trach collar 35% to this time.  Continue secretion management pulmonary toilet as well supportive measures. 2. Spinal cord injury cervical region continue to monitor 3. Traumatic brain injury unchanged 4. Lobar pneumonia continue present management 5. MRSA bacteremia pneumonia continue to follow   I have personally seen and evaluated the patient, evaluated laboratory and imaging results, formulated the assessment and plan and placed orders. The Patient requires high complexity decision making for assessment and support.  Case was discussed on Rounds with the Respiratory Therapy Staff  Yevonne Pax, MD [facility] Pulmonary Critical Care Medicine Sleep Medicine

## 2018-10-05 NOTE — Progress Notes (Addendum)
Pulmonary Critical Care Medicine [facility] [place]   PULMONARY CRITICAL CARE SERVICE  PROGRESS NOTE  Date of Service: 10/05/2018  Wayne Buckley  WYO:[id number]  DOB: September 02, 1950   DOA: 09/29/2018  Referring Physician: Carron Curie, MD  HPI: Wayne Buckley is a 68 y.o. male seen for follow up of Acute on Chronic Respiratory Failure.  Patient continues to do well on aerosol trach collar 35% FiO2.  Satting well with no distress at this time.  Medications: Reviewed on Rounds  Physical Exam:  Vitals: Pulse 108 respirations 16 BP 131/73 O2 sat 9910 temp 99.1  Ventilator Settings ATC 35%  . General: Comfortable at this time . Eyes: Grossly normal lids, irises & conjunctiva . ENT: grossly tongue is normal . Neck: no obvious mass . Cardiovascular: S1 S2 normal no gallop . Respiratory: No rales or rhonchi noted . Abdomen: soft . Skin: no rash seen on limited exam . Musculoskeletal: not rigid . Psychiatric:unable to assess . Neurologic: no seizure no involuntary movements         Lab Data:   Basic Metabolic Panel: Recent Labs  Lab 09/30/18 0602  NA 139  K 4.4  CL 100  CO2 26  GLUCOSE 105*  BUN 41*  CREATININE 1.35*  CALCIUM 9.2  MG 2.3    ABG: Recent Labs  Lab 09/30/18 2345  PHART 7.352  PCO2ART 46.9  PO2ART 237*  HCO3 25.3  O2SAT 99.4    Liver Function Tests: No results for input(s): AST, ALT, ALKPHOS, BILITOT, PROT, ALBUMIN in the last 168 hours. No results for input(s): LIPASE, AMYLASE in the last 168 hours. No results for input(s): AMMONIA in the last 168 hours.  CBC: Recent Labs  Lab 09/30/18 0602  WBC 13.3*  HGB 8.1*  HCT 26.0*  MCV 87.0  PLT 685*    Cardiac Enzymes: No results for input(s): CKTOTAL, CKMB, CKMBINDEX, TROPONINI in the last 168 hours.  BNP (last 3 results) No results for input(s): BNP in the last 8760 hours.  ProBNP (last 3 results) No results for input(s): PROBNP in the last 8760 hours.  Radiological Exams: No  results found.  Assessment/Plan Active Problems:   Acute on chronic respiratory failure with hypoxia (HCC)   TBI (traumatic brain injury) (HCC)   Lobar pneumonia, unspecified organism (HCC)   Spinal cord injury, cervical region (HCC)   1. Acute on chronic respiratory failure with hypoxia continue with aerosol trach collar 35% FiO2 at this time.  Continue supportive measures as well as pulmonary toilet and secretion management 2. Spinal cord injury cervical region continue to monitor 3. Traumatic brain injury unchanged 4. Lobar pneumonia continue present management 5. MRSA bacteremia 6. Continue to follow   I have personally seen and evaluated the patient, evaluated laboratory and imaging results, formulated the assessment and plan and placed orders. The Patient requires high complexity decision making for assessment and support.  Case was discussed on Rounds with the Respiratory Therapy Staff  Yevonne Pax, MD [facility] Pulmonary Critical Care Medicine Sleep Medicine

## 2018-10-06 NOTE — Progress Notes (Addendum)
Pulmonary Critical Care Medicine [facility] [place]   PULMONARY CRITICAL CARE SERVICE  PROGRESS NOTE  Date of Service: 10/06/2018  Wayne Buckley  [id number]:[id number]  DOB: 1950/11/20   DOA: 09/29/2018  Referring Physician: Carron Curie, MD  HPI: Wayne Buckley is a 68 y.o. male seen for follow up of Acute on Chronic Respiratory Failure.  Patient remains on aerosol trach collar 35% FiO2 patient doing well.distress.  Medications: Reviewed on Rounds  Physical Exam:  Vitals: Pulse 114 respirations 24 BP 135/76 O2 sat 95% temp 100.2  Ventilator Settings ATC 35%  . General: Comfortable at this time . Eyes: Grossly normal lids, irises & conjunctiva . ENT: grossly tongue is normal . Neck: no obvious mass . Cardiovascular: S1 S2 normal no gallop . Respiratory: No rales rhonchi noted . Abdomen: soft . Skin: no rash seen on limited exam . Musculoskeletal: not rigid . Psychiatric:unable to assess . Neurologic: no seizure no involuntary movements         Lab Data:   Basic Metabolic Panel: Recent Labs  Lab 09/30/18 0602  NA 139  K 4.4  CL 100  CO2 26  GLUCOSE 105*  BUN 41*  CREATININE 1.35*  CALCIUM 9.2  MG 2.3    ABG: Recent Labs  Lab 09/30/18 2345  PHART 7.352  PCO2ART 46.9  PO2ART 237*  HCO3 25.3  O2SAT 99.4    Liver Function Tests: No results for input(s): AST, ALT, ALKPHOS, BILITOT, PROT, ALBUMIN in the last 168 hours. No results for input(s): LIPASE, AMYLASE in the last 168 hours. No results for input(s): AMMONIA in the last 168 hours.  CBC: Recent Labs  Lab 09/30/18 0602  WBC 13.3*  HGB 8.1*  HCT 26.0*  MCV 87.0  PLT 685*    Cardiac Enzymes: No results for input(s): CKTOTAL, CKMB, CKMBINDEX, TROPONINI in the last 168 hours.  BNP (last 3 results) No results for input(s): BNP in the last 8760 hours.  ProBNP (last 3 results) No results for input(s): PROBNP in the last 8760 hours.  Radiological Exams: No results  found.  Assessment/Plan Active Problems:   Acute on chronic respiratory failure with hypoxia (HCC)   TBI (traumatic brain injury) (HCC)   Lobar pneumonia, unspecified organism (HCC)   Spinal cord injury, cervical region (HCC)   1. acute on chronic respiratory failure with hypoxia continue with aerosol trach collar 35% FiO2.  Continue pulmonary toilet secretion management as well as supportive measures. 2. Spinal cord injury cervical region continue to monitor 3. Traumatic brain injury unchanged 4. Lobar pneumonia continue present management 5. Heart heart continues to spot   I have personally seen and evaluated the patient, evaluated laboratory and imaging results, formulated the assessment and plan and placed orders. The Patient requires high complexity decision making for assessment and support.  Case was discussed on Rounds with the Respiratory Therapy Staff  Yevonne Pax, MD [facility] Pulmonary Critical Care Medicine Sleep Medicine

## 2018-10-07 ENCOUNTER — Other Ambulatory Visit (HOSPITAL_COMMUNITY): Payer: Self-pay

## 2018-10-07 LAB — BASIC METABOLIC PANEL
Anion gap: 8 (ref 5–15)
BUN: 46 mg/dL — ABNORMAL HIGH (ref 8–23)
CO2: 26 mmol/L (ref 22–32)
Calcium: 9.4 mg/dL (ref 8.9–10.3)
Chloride: 108 mmol/L (ref 98–111)
Creatinine, Ser: 1.67 mg/dL — ABNORMAL HIGH (ref 0.61–1.24)
GFR calc Af Amer: 48 mL/min — ABNORMAL LOW (ref >60)
GFR calc non Af Amer: 41 mL/min — ABNORMAL LOW (ref >60)
Glucose, Bld: 106 mg/dL — ABNORMAL HIGH (ref 70–99)
Potassium: 4.1 mmol/L (ref 3.5–5.1)
Sodium: 142 mmol/L (ref 135–145)

## 2018-10-07 LAB — CBC
HCT: 26.2 % — ABNORMAL LOW (ref 39.0–52.0)
Hemoglobin: 7.8 g/dL — ABNORMAL LOW (ref 13.0–17.0)
MCH: 26.6 pg (ref 26.0–34.0)
MCHC: 29.8 g/dL — ABNORMAL LOW (ref 30.0–36.0)
MCV: 89.4 fL (ref 80.0–100.0)
Platelets: 720 10*3/uL — ABNORMAL HIGH (ref 150–400)
RBC: 2.93 MIL/uL — ABNORMAL LOW (ref 4.22–5.81)
RDW: 15.2 % (ref 11.5–15.5)
WBC: 9.4 10*3/uL (ref 4.0–10.5)
nRBC: 0 % (ref 0.0–0.2)

## 2018-10-07 NOTE — Progress Notes (Addendum)
[facility] [place]   PULMONARY CRITICAL CARE SERVICE  PROGRESS NOTE  Date of Service: 10/07/2018  Wayne HunJames M Felicetti  WGN:[id number]  DOB: 08/08/1950   DOA: 09/29/2018  Referring Physician: Carron CurieAli Hijazi, MD  HPI: Wayne Buckley is a 68 y.o. male seen for follow up of Acute on Chronic Respiratory Failure.  Patient continues on aerosol trach collar 35% FiO2.  Patient is doing well with good saturations.  Medications: Reviewed on Rounds  Physical Exam:  Vitals: Pulse 115 respirations 26 BP 138/46 O2 sat 91% temp 99.8  Ventilator Settings ATC 35%  . General: Comfortable at this time . Eyes: Grossly normal lids, irises & conjunctiva . ENT: grossly tongue is normal . Neck: no obvious mass . Cardiovascular: S1 S2 normal no gallop . Respiratory: No rales or rhonchi noted . Abdomen: soft . Skin: no rash seen on limited exam . Musculoskeletal: not rigid . Psychiatric:unable to assess . Neurologic: no seizure no involuntary movements         Lab Data:   Basic Metabolic Panel: Recent Labs  Lab 10/07/18 0713  NA 142  K 4.1  CL 108  CO2 26  GLUCOSE 106*  BUN 46*  CREATININE 1.67*  CALCIUM 9.4    ABG: Recent Labs  Lab 09/30/18 2345  PHART 7.352  PCO2ART 46.9  PO2ART 237*  HCO3 25.3  O2SAT 99.4    Liver Function Tests: No results for input(s): AST, ALT, ALKPHOS, BILITOT, PROT, ALBUMIN in the last 168 hours. No results for input(s): LIPASE, AMYLASE in the last 168 hours. No results for input(s): AMMONIA in the last 168 hours.  CBC: Recent Labs  Lab 10/07/18 0713  WBC 9.4  HGB 7.8*  HCT 26.2*  MCV 89.4  PLT 720*    Cardiac Enzymes: No results for input(s): CKTOTAL, CKMB, CKMBINDEX, TROPONINI in the last 168 hours.  BNP (last 3 results) No results for input(s): BNP in the last 8760 hours.  ProBNP (last 3 results) No results for input(s): PROBNP in the last 8760 hours.  Radiological Exams: Dg Chest Port 1  View  Result Date: 10/07/2018 CLINICAL DATA:  Atelectasis EXAM: PORTABLE CHEST 1 VIEW COMPARISON:  September 30, 2018 FINDINGS: Tracheostomy catheter tip is 5.3 cm above the carina. No pneumothorax. There has been considerable clearing on the left since prior study. There is patchy atelectasis in the left mid lung and left base regions. Currently there is a small right pleural effusion. There is no consolidation. Heart size and pulmonary vascularity are normal. No adenopathy. There is aortic atherosclerosis. There is postoperative change in the lower cervical region. IMPRESSION: Tracheostomy as described without pneumothorax. Extensive clearing on the left compared to most recent study. There is patchy atelectasis remaining on the left in the mid lung and base regions. There is a small right pleural effusion. No frank airspace consolidation. Heart size within normal limits. Aortic Atherosclerosis (ICD10-I70.0). Electronically Signed   By: Bretta BangWilliam  Woodruff III M.D.   On: 10/07/2018 12:28    Assessment/Plan Active Problems:   Acute on chronic respiratory failure with hypoxia (HCC)   TBI (traumatic brain injury) (HCC)   Lobar pneumonia, unspecified organism (HCC)   Spinal cord injury, cervical region (HCC)   1. Acute on chronic respiratory failure with hypoxia continue with aerosol trach collar 35% FiO2.  Continue secretion management and pulmonary toilet. 2. Spinal cord injury cervical region continue to monitor 3. Traumatic brain injury unchanged 4. Lobar pneumonia continue present management 5. MRSA bacteremia  pneumonia continue to follow   I have personally seen and evaluated the patient, evaluated laboratory and imaging results, formulated the assessment and plan and placed orders. The Patient requires high complexity decision making for assessment and support.  Case was discussed on Rounds with the Respiratory Therapy Staff  Yevonne Pax, MD [facility] Pulmonary Critical Care Medicine Sleep  Medicine

## 2018-10-08 NOTE — Progress Notes (Addendum)
[facility] [place]   PULMONARY CRITICAL CARE SERVICE  PROGRESS NOTE  Date of Service: 10/08/2018  Wayne HunJames M Buckley  UEA:[id number]  DOB: 12/21/1950   DOA: 09/29/2018  Referring Physician: Carron CurieAli Hijazi, MD  HPI: Wayne Buckley is a 68 y.o. male seen for follow up of Acute on Chronic Respiratory Failure.  Patient is on aerosol trach collar 30% FiO2.  Patient remains afebrile with saturations.  Medications: Reviewed on Rounds  Physical Exam:  Vitals: Pulse 91 respirations 30 BP 103/61 O2 sat 99% to 98.8  Ventilator Settings ATC 30%  . General: Comfortable at this time . Eyes: Grossly normal lids, irises & conjunctiva . ENT: grossly tongue is normal . Neck: no obvious mass . Cardiovascular: S1 S2 normal no gallop . Respiratory: No rales or rhonchi noted . Abdomen: soft . Skin: no rash seen on limited exam . Musculoskeletal: not rigid . Psychiatric:unable to assess . Neurologic: no seizure no involuntary movements         Lab Data:   Basic Metabolic Panel: Recent Labs  Lab 10/07/18 0713  NA 142  K 4.1  CL 108  CO2 26  GLUCOSE 106*  BUN 46*  CREATININE 1.67*  CALCIUM 9.4    ABG: No results for input(s): PHART, PCO2ART, PO2ART, HCO3, O2SAT in the last 168 hours.  Liver Function Tests: No results for input(s): AST, ALT, ALKPHOS, BILITOT, PROT, ALBUMIN in the last 168 hours. No results for input(s): LIPASE, AMYLASE in the last 168 hours. No results for input(s): AMMONIA in the last 168 hours.  CBC: Recent Labs  Lab 10/07/18 0713  WBC 9.4  HGB 7.8*  HCT 26.2*  MCV 89.4  PLT 720*    Cardiac Enzymes: No results for input(s): CKTOTAL, CKMB, CKMBINDEX, TROPONINI in the last 168 hours.  BNP (last 3 results) No results for input(s): BNP in the last 8760 hours.  ProBNP (last 3 results) No results for input(s): PROBNP in the last 8760 hours.  Radiological Exams: Dg Chest Port 1 View  Result Date: 10/07/2018 CLINICAL  DATA:  Atelectasis EXAM: PORTABLE CHEST 1 VIEW COMPARISON:  September 30, 2018 FINDINGS: Tracheostomy catheter tip is 5.3 cm above the carina. No pneumothorax. There has been considerable clearing on the left since prior study. There is patchy atelectasis in the left mid lung and left base regions. Currently there is a small right pleural effusion. There is no consolidation. Heart size and pulmonary vascularity are normal. No adenopathy. There is aortic atherosclerosis. There is postoperative change in the lower cervical region. IMPRESSION: Tracheostomy as described without pneumothorax. Extensive clearing on the left compared to most recent study. There is patchy atelectasis remaining on the left in the mid lung and base regions. There is a small right pleural effusion. No frank airspace consolidation. Heart size within normal limits. Aortic Atherosclerosis (ICD10-I70.0). Electronically Signed   By: Bretta BangWilliam  Woodruff III M.D.   On: 10/07/2018 12:28    Assessment/Plan Active Problems:   Acute on chronic respiratory failure with hypoxia (HCC)   TBI (traumatic brain injury) (HCC)   Lobar pneumonia, unspecified organism (HCC)   Spinal cord injury, cervical region (HCC)   1. Acute on chronic respiratory with hypoxia continue with aerosol trach collar 80% FiO2.  Continue pulmonary toilet and secretion management daily 2. Spinal cord injury cervical region continue management 3. Traumatic injury unchanged 4. Lobar pneumonia continue present management patient was growing atypical Mycobacterium spoke with pharmacy regarding the antibiotics 5. MRSA bacteremia pneumonia continue  following   I have personally seen and evaluated the patient, evaluated laboratory and imaging results, formulated the assessment and plan and placed orders. The Patient requires high complexity decision making for assessment and support.  Case was discussed on Rounds with the Respiratory Therapy Staff  Yevonne Pax, MD  [facility] Pulmonary Critical Care Medicine Sleep Medicine

## 2018-10-09 LAB — ORG ID BY SEQUENCING RFLX AST

## 2018-10-09 LAB — ACID FAST CULTURE WITH REFLEXED SENSITIVITIES (MYCOBACTERIA): Acid Fast Culture: POSITIVE — AB

## 2018-10-09 NOTE — Progress Notes (Addendum)
Pulmonary Critical Care Medicine [facility] [place]   PULMONARY CRITICAL CARE SERVICE  PROGRESS NOTE  Date of Service: 10/09/2018  Wayne Buckley  [id number]  DOB: 1950-07-04   DOA: 09/29/2018  Referring Physician: Carron Curie, MD  HPI: Wayne Buckley is a 67 y.o. male seen for follow up of Acute on Chronic Respiratory Failure.  Patient continues on aerosol trach collar 28% FiO2.  His secretions have improved.  Patient is resting without fever or distress.  Medications: Reviewed on Rounds  Physical Exam:  Vitals: Pulse 87 respirations 20 BP 111/71 O2 sat 96% temp 98.0  Ventilator Settings ATC 28%  . General: Comfortable at this time . Eyes: Grossly normal lids, irises & conjunctiva . ENT: grossly tongue is normal . Neck: no obvious mass . Cardiovascular: S1 S2 normal no gallop . Respiratory: No rales or rhonchi noted . Abdomen: soft . Skin: no rash seen on limited exam . Musculoskeletal: not rigid . Psychiatric:unable to assess . Neurologic: no seizure no involuntary movements         Lab Data:   Basic Metabolic Panel: Recent Labs  Lab 10/07/18 0713  NA 142  K 4.1  CL 108  CO2 26  GLUCOSE 106*  BUN 46*  CREATININE 1.67*  CALCIUM 9.4    ABG: No results for input(s): PHART, PCO2ART, PO2ART, HCO3, O2SAT in the last 168 hours.  Liver Function Tests: No results for input(s): AST, ALT, ALKPHOS, BILITOT, PROT, ALBUMIN in the last 168 hours. No results for input(s): LIPASE, AMYLASE in the last 168 hours. No results for input(s): AMMONIA in the last 168 hours.  CBC: Recent Labs  Lab 10/07/18 0713  WBC 9.4  HGB 7.8*  HCT 26.2*  MCV 89.4  PLT 720*    Cardiac Enzymes: No results for input(s): CKTOTAL, CKMB, CKMBINDEX, TROPONINI in the last 168 hours.  BNP (last 3 results) No results for input(s): BNP in the last 8760 hours.  ProBNP (last 3 results) No results for input(s): PROBNP in the last 8760 hours.  Radiological Exams: No results  found.  Assessment/Plan Active Problems:   Acute on chronic respiratory failure with hypoxia (HCC)   TBI (traumatic brain injury) (HCC)   Lobar pneumonia, unspecified organism (HCC)   Spinal cord injury, cervical region (HCC)   1. Acute on chronic respiratory failure with hypoxia continue with ATC at 28% FiO2.  Continue secretion management and pulmonary toilet. 2. Spinal cord injury cervical region continue present management 3. Traumatic brain injury unchanged 4. Lobar pneumonia continue present management on antibiotics for atypical Mycobacterium adjusted based on the cultures 5. MRSA bacteremia pneumonia continue to follow   I have personally seen and evaluated the patient, evaluated laboratory and imaging results, formulated the assessment and plan and placed orders. The Patient requires high complexity decision making for assessment and support.  Case was discussed on Rounds with the Respiratory Therapy Staff  Yevonne Pax, MD [facility] Pulmonary Critical Care Medicine Sleep Medicine

## 2018-10-10 NOTE — Progress Notes (Addendum)
Pulmonary Critical Care Medicine [facility] [place]   PULMONARY CRITICAL CARE SERVICE  PROGRESS NOTE  Date of Service: 10/10/2018  Wayne Buckley  [id number]  DOB: Jan 10, 1951   DOA: 09/29/2018  Referring Physician: Carron Curie, MD  HPI: Wayne Buckley is a 68 y.o. male seen for follow up of Acute on Chronic Respiratory Failure.  Patient continues on aerosol trach collar 28% FiO2.  Doing well at this time with no distress noted.  Medications: Reviewed on Rounds  Physical Exam:  Vitals: Pulse 96 respirations 28 BP 125/80 O2 sat 97% temp 97.3  Ventilator Settings ATC 28%  . General: Comfortable at this time . Eyes: Grossly normal lids, irises & conjunctiva . ENT: grossly tongue is normal . Neck: no obvious mass . Cardiovascular: S1 S2 normal no gallop . Respiratory: No rales or rhonchi noted . Abdomen: soft . Skin: no rash seen on limited exam . Musculoskeletal: not rigid . Psychiatric:unable to assess . Neurologic: no seizure no involuntary movements         Lab Data:   Basic Metabolic Panel: Recent Labs  Lab 10/07/18 0713  NA 142  K 4.1  CL 108  CO2 26  GLUCOSE 106*  BUN 46*  CREATININE 1.67*  CALCIUM 9.4    ABG: No results for input(s): PHART, PCO2ART, PO2ART, HCO3, O2SAT in the last 168 hours.  Liver Function Tests: No results for input(s): AST, ALT, ALKPHOS, BILITOT, PROT, ALBUMIN in the last 168 hours. No results for input(s): LIPASE, AMYLASE in the last 168 hours. No results for input(s): AMMONIA in the last 168 hours.  CBC: Recent Labs  Lab 10/07/18 0713  WBC 9.4  HGB 7.8*  HCT 26.2*  MCV 89.4  PLT 720*    Cardiac Enzymes: No results for input(s): CKTOTAL, CKMB, CKMBINDEX, TROPONINI in the last 168 hours.  BNP (last 3 results) No results for input(s): BNP in the last 8760 hours.  ProBNP (last 3 results) No results for input(s): PROBNP in the last 8760 hours.  Radiological Exams: No results  found.  Assessment/Plan Active Problems:   Acute on chronic respiratory failure with hypoxia (HCC)   TBI (traumatic brain injury) (HCC)   Lobar pneumonia, unspecified organism (HCC)   Spinal cord injury, cervical region (HCC)   1. Acute on chronic respiratory failure with hypoxia continue ATC at 28% FiO2 as tolerated.  Continue pulmonary toilet and secretion management 2. Spinal cord injury cervical region continue present management 3. Traumatic brain injury unchanged 4. Lobar pneumonia continue present management 5. MRSA bacteremia pneumonia continue to follow   I have personally seen and evaluated the patient, evaluated laboratory and imaging results, formulated the assessment and plan and placed orders. The Patient requires high complexity decision making for assessment and support.  Case was discussed on Rounds with the Respiratory Therapy Staff  Yevonne Pax, MD [facility] Pulmonary Critical Care Medicine Sleep Medicine

## 2018-10-11 LAB — RAPID GROWER BROTH SUSCEP.
Amikacin: 2
Ciprofloxacin: >4
Clarithromycin: 16
Doxycycline: >16
Imipenem: 4
Linezolid: 8
Minocycline: >8
Moxifloxacin: 8
Tigecycline: 0.5

## 2018-10-11 LAB — AFB ORGANISM ID BY DNA PROBE
M avium complex: NEGATIVE
M tuberculosis complex: NEGATIVE

## 2018-10-11 LAB — ACID FAST CULTURE WITH REFLEXED SENSITIVITIES (MYCOBACTERIA): Acid Fast Culture: POSITIVE — AB

## 2018-10-11 LAB — ORG ID BY SEQUENCING RFLX AST

## 2018-10-11 NOTE — Progress Notes (Addendum)
Pulmonary Critical Care Medicine [facility] [place]   PULMONARY CRITICAL CARE SERVICE  PROGRESS NOTE  Date of Service: 10/11/2018  Wayne Buckley  [id number]  DOB: [birth year]   DOA: 09/29/2018  Referring Physician: Carron Curie, MD  HPI: Wayne Buckley is a 68 y.o. male seen for follow up of Acute on Chronic Respiratory Failure.  Patient continues on aerosol trach collar 28%.  Resting comfortably at this time satting well with no fever or distress.  Medications: Reviewed on Rounds  Physical Exam:  Vitals: Pulse 99 respirations 40 BP 103/58 O2 sat 99% temp 96.5  Ventilator Settings ATC 28%  . General: Comfortable at this time . Eyes: Grossly normal lids, irises & conjunctiva . ENT: grossly tongue is normal . Neck: no obvious mass . Cardiovascular: S1 S2 normal no gallop . Respiratory: No rales or rhonchi noted . Abdomen: soft . Skin: no rash seen on limited exam . Musculoskeletal: not rigid . Psychiatric:unable to assess . Neurologic: no seizure no involuntary movements         Lab Data:   Basic Metabolic Panel: Recent Labs  Lab 10/07/18 0713  NA 142  K 4.1  CL 108  CO2 26  GLUCOSE 106*  BUN 46*  CREATININE 1.67*  CALCIUM 9.4    ABG: No results for input(s): PHART, PCO2ART, PO2ART, HCO3, O2SAT in the last 168 hours.  Liver Function Tests: No results for input(s): AST, ALT, ALKPHOS, BILITOT, PROT, ALBUMIN in the last 168 hours. No results for input(s): LIPASE, AMYLASE in the last 168 hours. No results for input(s): AMMONIA in the last 168 hours.  CBC: Recent Labs  Lab 10/07/18 0713  WBC 9.4  HGB 7.8*  HCT 26.2*  MCV 89.4  PLT 720*    Cardiac Enzymes: No results for input(s): CKTOTAL, CKMB, CKMBINDEX, TROPONINI in the last 168 hours.  BNP (last 3 results) No results for input(s): BNP in the last 8760 hours.  ProBNP (last 3 results) No results for input(s): PROBNP in the last 8760 hours.  Radiological Exams: No results  found.  Assessment/Plan Active Problems:   Acute on chronic respiratory failure with hypoxia (HCC)   TBI (traumatic brain injury) (HCC)   Lobar pneumonia, unspecified organism (HCC)   Spinal cord injury, cervical region (HCC)   1. Acute on chronic respiratory failure with hypoxia continue aerosol trach collar 28% FiO2 as tolerated.  Continue pulmonary toilet and secretion management 2. Spinal cord injury cervical region continue present therapy 3. Traumatic brain injury unchanged 4. Lobar pneumonia continue present management 5. MRSA bacteremia pneumonia continue to follow   I have personally seen and evaluated the patient, evaluated laboratory and imaging results, formulated the assessment and plan and placed orders. The Patient requires high complexity decision making for assessment and support.  Case was discussed on Rounds with the Respiratory Therapy Staff  Yevonne Pax, MD [facility] Pulmonary Critical Care Medicine Sleep Medicine

## 2018-10-12 NOTE — Progress Notes (Addendum)
Pulmonary Critical Care Medicine [facility] [place]   PULMONARY CRITICAL CARE SERVICE  PROGRESS NOTE  Date of Service: 10/12/2018  Wayne Buckley  [id number]  DOB: 24-Jun-1950   DOA: 09/29/2018  Referring Physician: Carron Curie, MD  HPI: Wayne Buckley is a 68 y.o. male seen for follow up of Acute on Chronic Respiratory Failure.  Patient continues to do well on 28% aerosol trach collar.  No fever or distress noted at this time.  Medications: Reviewed on Rounds  Physical Exam:  Vitals: Pulse 97 respirations 30 BP 132/75 O2 sat 98% temp 98.1  Ventilator Settings 20% ATC  . General: Comfortable at this time . Eyes: Grossly normal lids, irises & conjunctiva . ENT: grossly tongue is normal . Neck: no obvious mass . Cardiovascular: S1 S2 normal no gallop . Respiratory: No rales or rhonchi noted . Abdomen: soft . Skin: no rash seen on limited exam . Musculoskeletal: not rigid . Psychiatric:unable to assess . Neurologic: no seizure no involuntary movements         Lab Data:   Basic Metabolic Panel: Recent Labs  Lab 10/07/18 0713  NA 142  K 4.1  CL 108  CO2 26  GLUCOSE 106*  BUN 46*  CREATININE 1.67*  CALCIUM 9.4    ABG: No results for input(s): PHART, PCO2ART, PO2ART, HCO3, O2SAT in the last 168 hours.  Liver Function Tests: No results for input(s): AST, ALT, ALKPHOS, BILITOT, PROT, ALBUMIN in the last 168 hours. No results for input(s): LIPASE, AMYLASE in the last 168 hours. No results for input(s): AMMONIA in the last 168 hours.  CBC: Recent Labs  Lab 10/07/18 0713  WBC 9.4  HGB 7.8*  HCT 26.2*  MCV 89.4  PLT 720*    Cardiac Enzymes: No results for input(s): CKTOTAL, CKMB, CKMBINDEX, TROPONINI in the last 168 hours.  BNP (last 3 results) No results for input(s): BNP in the last 8760 hours.  ProBNP (last 3 results) No results for input(s): PROBNP in the last 8760 hours.  Radiological Exams: No results found.  Assessment/Plan Active  Problems:   Acute on chronic respiratory failure with hypoxia (HCC)   TBI (traumatic brain injury) (HCC)   Lobar pneumonia, unspecified organism (HCC)   Spinal cord injury, cervical region (HCC)   1. Acute on chronic respiratory failure with hypoxia continue aerosol trach collar 28% FiO2.  Continue pulmonary toilet and secretion management.  Patient remains at baseline 2. Spinal cord injury cervical region continue present therapy 3. Traumatic brain injury unchanged 4. Lobar pneumonia continue present management 5. MRSA bacteremia pneumonia continue to follow   I have personally seen and evaluated the patient, evaluated laboratory and imaging results, formulated the assessment and plan and placed orders. The Patient requires high complexity decision making for assessment and support.  Case was discussed on Rounds with the Respiratory Therapy Staff  Yevonne Pax, MD [facility] Pulmonary Critical Care Medicine Sleep Medicine

## 2018-10-13 LAB — CBC
HCT: 26.5 % — ABNORMAL LOW (ref 39.0–52.0)
Hemoglobin: 8.2 g/dL — ABNORMAL LOW (ref 13.0–17.0)
MCH: 27.3 pg (ref 26.0–34.0)
MCHC: 30.9 g/dL (ref 30.0–36.0)
MCV: 88.3 fL (ref 80.0–100.0)
Platelets: 610 10*3/uL — ABNORMAL HIGH (ref 150–400)
RBC: 3 MIL/uL — ABNORMAL LOW (ref 4.22–5.81)
RDW: 15.3 % (ref 11.5–15.5)
WBC: 7.4 10*3/uL (ref 4.0–10.5)
nRBC: 0 % (ref 0.0–0.2)

## 2018-10-13 LAB — BASIC METABOLIC PANEL
Anion gap: 9 (ref 5–15)
BUN: 58 mg/dL — ABNORMAL HIGH (ref 8–23)
CO2: 24 mmol/L (ref 22–32)
Calcium: 9.8 mg/dL (ref 8.9–10.3)
Chloride: 114 mmol/L — ABNORMAL HIGH (ref 98–111)
Creatinine, Ser: 1.64 mg/dL — ABNORMAL HIGH (ref 0.61–1.24)
GFR calc Af Amer: 49 mL/min — ABNORMAL LOW (ref >60)
GFR calc non Af Amer: 42 mL/min — ABNORMAL LOW (ref >60)
Glucose, Bld: 99 mg/dL (ref 70–99)
Potassium: 3.5 mmol/L (ref 3.5–5.1)
Sodium: 147 mmol/L — ABNORMAL HIGH (ref 135–145)

## 2018-10-13 NOTE — Progress Notes (Addendum)
Pulmonary Critical Care Medicine [facility] [place]   PULMONARY CRITICAL CARE SERVICE  PROGRESS NOTE  Date of Service: 10/13/2018  Wayne Buckley  [id number]  DOB: 11-Jun-1951   DOA: 09/29/2018  Referring Physician: Carron Curie, MD  HPI: Wayne Buckley is a 68 y.o. male seen for follow up of Acute on Chronic Respiratory Failure.  Patient continues on aerosol trach collar 28% FiO2.  No distress or fever noted.  Medications: Reviewed on Rounds  Physical Exam:  Vitals: Pulse 101 respirations 25 BP 138/71 O2 sat 98% temp 99.0  Ventilator Settings 28% ATC  . General: Comfortable at this time . Eyes: Grossly normal lids, irises & conjunctiva . ENT: grossly tongue is normal . Neck: no obvious mass . Cardiovascular: S1 S2 normal no gallop . Respiratory: No rales or rhonchi noted . Abdomen: soft . Skin: no rash seen on limited exam . Musculoskeletal: not rigid . Psychiatric:unable to assess . Neurologic: no seizure no involuntary movements         Lab Data:   Basic Metabolic Panel: Recent Labs  Lab 10/07/18 0713 10/13/18 0438  NA 142 147*  K 4.1 3.5  CL 108 114*  CO2 26 24  GLUCOSE 106* 99  BUN 46* 58*  CREATININE 1.67* 1.64*  CALCIUM 9.4 9.8    ABG: No results for input(s): PHART, PCO2ART, PO2ART, HCO3, O2SAT in the last 168 hours.  Liver Function Tests: No results for input(s): AST, ALT, ALKPHOS, BILITOT, PROT, ALBUMIN in the last 168 hours. No results for input(s): LIPASE, AMYLASE in the last 168 hours. No results for input(s): AMMONIA in the last 168 hours.  CBC: Recent Labs  Lab 10/07/18 0713 10/13/18 0438  WBC 9.4 7.4  HGB 7.8* 8.2*  HCT 26.2* 26.5*  MCV 89.4 88.3  PLT 720* 610*    Cardiac Enzymes: No results for input(s): CKTOTAL, CKMB, CKMBINDEX, TROPONINI in the last 168 hours.  BNP (last 3 results) No results for input(s): BNP in the last 8760 hours.  ProBNP (last 3 results) No results for input(s): PROBNP in the last 8760  hours.  Radiological Exams: No results found.  Assessment/Plan Active Problems:   Acute on chronic respiratory failure with hypoxia (HCC)   TBI (traumatic brain injury) (HCC)   Lobar pneumonia, unspecified organism (HCC)   Spinal cord injury, cervical region (HCC)   1. Acute on chronic respiratory failure with hypoxia continue aerosol trach collar 20% FiO2.  Continue secretion management pulmonary toilet. 2. Spinal cord injury cervical region continue present therapy 3. Traumatic brain injury unchanged 4. Lobar pneumonia continue present management 5. MRSA bacteremia pneumonia continue to follow   I have personally seen and evaluated the patient, evaluated laboratory and imaging results, formulated the assessment and plan and placed orders. The Patient requires high complexity decision making for assessment and support.  Case was discussed on Rounds with the Respiratory Therapy Staff  Yevonne Pax, MD [facility] Pulmonary Critical Care Medicine Sleep Medicine

## 2018-10-14 ENCOUNTER — Other Ambulatory Visit (HOSPITAL_COMMUNITY): Payer: Self-pay

## 2018-10-14 NOTE — Progress Notes (Addendum)
Pulmonary Critical Care Medicine [facility] [place]   PULMONARY CRITICAL CARE SERVICE  PROGRESS NOTE  Date of Service: 10/14/2018  Wayne Buckley  [id number]  DOB: 1951-02-09   DOA: 09/29/2018  Referring Physician: Carron Curie, MD  HPI: Wayne Buckley is a 68 y.o. male seen for follow up of Acute on Chronic Respiratory Failure.  Patient continues on aerosol trach collar 20% FiO2.  Janina Mayo today will be switched from a #6 cuffed to a cuffless.  Overall doing well.  Medications: Reviewed on Rounds  Physical Exam:  Vitals: Pulse 96 respiration 24 BP 102/66 O2 sat 98% temp 98.5  Ventilator Settings ATC 28%  . General: Comfortable at this time . Eyes: Grossly normal lids, irises & conjunctiva . ENT: grossly tongue is normal . Neck: no obvious mass . Cardiovascular: S1 S2 normal no gallop . Respiratory: No rales or rhonchi noted . Abdomen: soft . Skin: no rash seen on limited exam . Musculoskeletal: not rigid . Psychiatric:unable to assess . Neurologic: no seizure no involuntary movements         Lab Data:   Basic Metabolic Panel: Recent Labs  Lab 10/13/18 0438  NA 147*  K 3.5  CL 114*  CO2 24  GLUCOSE 99  BUN 58*  CREATININE 1.64*  CALCIUM 9.8    ABG: No results for input(s): PHART, PCO2ART, PO2ART, HCO3, O2SAT in the last 168 hours.  Liver Function Tests: No results for input(s): AST, ALT, ALKPHOS, BILITOT, PROT, ALBUMIN in the last 168 hours. No results for input(s): LIPASE, AMYLASE in the last 168 hours. No results for input(s): AMMONIA in the last 168 hours.  CBC: Recent Labs  Lab 10/13/18 0438  WBC 7.4  HGB 8.2*  HCT 26.5*  MCV 88.3  PLT 610*    Cardiac Enzymes: No results for input(s): CKTOTAL, CKMB, CKMBINDEX, TROPONINI in the last 168 hours.  BNP (last 3 results) No results for input(s): BNP in the last 8760 hours.  ProBNP (last 3 results) No results for input(s): PROBNP in the last 8760 hours.  Radiological Exams: No results  found.  Assessment/Plan Active Problems:   Acute on chronic respiratory failure with hypoxia (HCC)   TBI (traumatic brain injury) (HCC)   Lobar pneumonia, unspecified organism (HCC)   Spinal cord injury, cervical region (HCC)   1. Acute on chronic respiratory failure with hypoxia continue aerosol trach collar 28% FiO2.  Continue pulmonary toilet and secretion management 2. Spinal cord injury cervical region continue present therapy 3. Traumatic brain injury unchanged 4. Lobar pneumonia continue present management 5. MRSA bacteremia pneumonia continue to follow   I have personally seen and evaluated the patient, evaluated laboratory and imaging results, formulated the assessment and plan and placed orders. The Patient requires high complexity decision making for assessment and support.  Case was discussed on Rounds with the Respiratory Therapy Staff  Yevonne Pax, MD [facility] Pulmonary Critical Care Medicine Sleep Medicine

## 2018-10-15 LAB — AMIKACIN, TROUGH: Amikacin Tr: 26.8 ug/mL — ABNORMAL HIGH (ref 1.0–8.0)

## 2018-10-15 NOTE — Progress Notes (Addendum)
Pulmonary Critical Care Medicine [facility] [place]   PULMONARY CRITICAL CARE SERVICE  PROGRESS NOTE  Date of Service: 10/15/2018  Wayne Buckley  [id number]  DOB: Jan 27, 1951   DOA: 09/29/2018  Referring Physician: Carron Curie, MD  HPI: Wayne Buckley is a 68 y.o. male seen for follow up of Acute on Chronic Respiratory Failure.  Patient continues on aerosol trach collar 28% FiO2.  Resting comfortably with no distress at this time.  Medications: Reviewed on Rounds  Physical Exam:  Vitals: Pulse 79 respirations 27 BP 93/68 O2 sat 95% temp 97.6  Ventilator Settings ATC 28%  . General: Comfortable at this time . Eyes: Grossly normal lids, irises & conjunctiva . ENT: grossly tongue is normal . Neck: no obvious mass . Cardiovascular: S1 S2 normal no gallop . Respiratory: No rales or rhonchi noted . Abdomen: soft . Skin: no rash seen on limited exam . Musculoskeletal: not rigid . Psychiatric:unable to assess . Neurologic: no seizure no involuntary movements         Lab Data:   Basic Metabolic Panel: Recent Labs  Lab 10/13/18 0438  NA 147*  K 3.5  CL 114*  CO2 24  GLUCOSE 99  BUN 58*  CREATININE 1.64*  CALCIUM 9.8    ABG: No results for input(s): PHART, PCO2ART, PO2ART, HCO3, O2SAT in the last 168 hours.  Liver Function Tests: No results for input(s): AST, ALT, ALKPHOS, BILITOT, PROT, ALBUMIN in the last 168 hours. No results for input(s): LIPASE, AMYLASE in the last 168 hours. No results for input(s): AMMONIA in the last 168 hours.  CBC: Recent Labs  Lab 10/13/18 0438  WBC 7.4  HGB 8.2*  HCT 26.5*  MCV 88.3  PLT 610*    Cardiac Enzymes: No results for input(s): CKTOTAL, CKMB, CKMBINDEX, TROPONINI in the last 168 hours.  BNP (last 3 results) No results for input(s): BNP in the last 8760 hours.  ProBNP (last 3 results) No results for input(s): PROBNP in the last 8760 hours.  Radiological Exams: Dg Chest Port 1 View  Result Date:  10/14/2018 CLINICAL DATA:  Follow-up pneumonia.  Tracheostomy change. EXAM: PORTABLE CHEST 1 VIEW COMPARISON:  10/07/2018 FINDINGS: Patient slightly rotated to the left. Tracheostomy tube appears in adequate position. Lungs are hypoinflated with linear atelectasis over the left mid lung unchanged. Minimal stable left base opacification. Slight worsening hazy opacification over the right mid to lower lung which may be due to layering effusion with atelectasis although infection is possible. Cardiomediastinal silhouette and remainder of the exam is unchanged. IMPRESSION: Slight worsening hazy opacification over the right mid to lower lung which may be due to layering effusion with atelectasis although infection is possible. Stable minimal left base opacification and stable linear atelectasis left midlung. Tracheostomy tube in adequate position. Electronically Signed   By: Elberta Fortis M.D.   On: 10/14/2018 16:01    Assessment/Plan Active Problems:   Acute on chronic respiratory failure with hypoxia (HCC)   TBI (traumatic brain injury) (HCC)   Lobar pneumonia, unspecified organism (HCC)   Spinal cord injury, cervical region (HCC)   1. Acute on chronic respiratory failure with hypoxia continue aerosol trach collar 20% FiO2 as directed.  Continue secretion management pulmonary toilet and supportive measures 2. Spinal cord injury cervical region continue present therapy 3. Traumatic brain injury unchanged 4. Lobar pneumonia continue present management 5. MRSA bacteremia pneumonia continue to follow   I have personally seen and evaluated the patient, evaluated laboratory and imaging results, formulated  the assessment and plan and placed orders. The Patient requires high complexity decision making for assessment and support.  Case was discussed on Rounds with the Respiratory Therapy Staff  Allyne Gee, MD [facility] Pulmonary Critical Care Medicine Sleep Medicine

## 2018-10-16 NOTE — Progress Notes (Signed)
Pulmonary Critical Care Medicine [facility] [place]   PULMONARY CRITICAL CARE SERVICE  PROGRESS NOTE  Date of Service: 10/16/2018  GABIEL YANT  [id number]  DOB: [birth year]   DOA: 09/29/2018  Referring Physician: Carron Curie, MD  HPI: ARI ROSENDALE is a 68 y.o. male seen for follow up of Acute on Chronic Respiratory Failure.  Reportedly secretions are doing better patient has had less amount of secretions.  Patient remains on T collar currently is on 28% FiO2  Medications: Reviewed on Rounds  Physical Exam:  Vitals: Temperature 98.4 pulse 95 respiratory 24 blood pressure 95/49 saturations 95%  Ventilator Settings currently is off the ventilator on T collar  . General: Comfortable at this time . Eyes: Grossly normal lids, irises & conjunctiva . ENT: grossly tongue is normal . Neck: no obvious mass . Cardiovascular: S1 S2 normal no gallop . Respiratory: Coarse breath sounds no rhonchi . Abdomen: soft . Skin: no rash seen on limited exam . Musculoskeletal: not rigid . Psychiatric:unable to assess . Neurologic: no seizure no involuntary movements         Lab Data:   Basic Metabolic Panel: Recent Labs  Lab 10/13/18 0438  NA 147*  K 3.5  CL 114*  CO2 24  GLUCOSE 99  BUN 58*  CREATININE 1.64*  CALCIUM 9.8    ABG: No results for input(s): PHART, PCO2ART, PO2ART, HCO3, O2SAT in the last 168 hours.  Liver Function Tests: No results for input(s): AST, ALT, ALKPHOS, BILITOT, PROT, ALBUMIN in the last 168 hours. No results for input(s): LIPASE, AMYLASE in the last 168 hours. No results for input(s): AMMONIA in the last 168 hours.  CBC: Recent Labs  Lab 10/13/18 0438  WBC 7.4  HGB 8.2*  HCT 26.5*  MCV 88.3  PLT 610*    Cardiac Enzymes: No results for input(s): CKTOTAL, CKMB, CKMBINDEX, TROPONINI in the last 168 hours.  BNP (last 3 results) No results for input(s): BNP in the last 8760 hours.  ProBNP (last 3 results) No results for input(s):  PROBNP in the last 8760 hours.  Radiological Exams: No results found.  Assessment/Plan Active Problems:   Acute on chronic respiratory failure with hypoxia (HCC)   TBI (traumatic brain injury) (HCC)   Lobar pneumonia, unspecified organism (HCC)   Spinal cord injury, cervical region (HCC)   1. Acute on chronic respiratory failure with hypoxia we will continue with T collar trials titrate oxygen down as tolerated.  Patient secretions are improving as they improve hopefully we can then consider capping until then he should not be decannulated 2. Traumatic brain injury grossly unchanged 3. Lobar pneumonia treated we will continue to follow 4. Spinal cord injury cervical region we will continue with supportive care   I have personally seen and evaluated the patient, evaluated laboratory and imaging results, formulated the assessment and plan and placed orders. The Patient requires high complexity decision making for assessment and support.  Case was discussed on Rounds with the Respiratory Therapy Staff  Yevonne Pax, MD [facility] Pulmonary Critical Care Medicine Sleep Medicine

## 2018-10-17 NOTE — Progress Notes (Addendum)
Pulmonary Critical Care Medicine [facility] [place]   PULMONARY CRITICAL CARE SERVICE  PROGRESS NOTE  Date of Service: 10/17/2018  Wayne Buckley  [id number]  DOB: [birth year]   DOA: 09/29/2018  Referring Physician: Carron Curie, MD  HPI: Wayne Buckley is a 68 y.o. male seen for follow up of Acute on Chronic Respiratory Failure.  Patient continues on 28% aerosol trach collar with good saturations.  His secretions appear to be less at this time.  Medications: Reviewed on Rounds  Physical Exam:  Vitals: Pulse 88 respirations 28 BP 112/69 O2 sat 97% temp 97.0  Ventilator Settings ATC 28%  . General: Comfortable at this time . Eyes: Grossly normal lids, irises & conjunctiva . ENT: grossly tongue is normal . Neck: no obvious mass . Cardiovascular: S1 S2 normal no gallop . Respiratory: Coarse breath sounds . Abdomen: soft . Skin: no rash seen on limited exam . Musculoskeletal: not rigid . Psychiatric:unable to assess . Neurologic: no seizure no involuntary movements         Lab Data:   Basic Metabolic Panel: Recent Labs  Lab 10/13/18 0438  NA 147*  K 3.5  CL 114*  CO2 24  GLUCOSE 99  BUN 58*  CREATININE 1.64*  CALCIUM 9.8    ABG: No results for input(s): PHART, PCO2ART, PO2ART, HCO3, O2SAT in the last 168 hours.  Liver Function Tests: No results for input(s): AST, ALT, ALKPHOS, BILITOT, PROT, ALBUMIN in the last 168 hours. No results for input(s): LIPASE, AMYLASE in the last 168 hours. No results for input(s): AMMONIA in the last 168 hours.  CBC: Recent Labs  Lab 10/13/18 0438  WBC 7.4  HGB 8.2*  HCT 26.5*  MCV 88.3  PLT 610*    Cardiac Enzymes: No results for input(s): CKTOTAL, CKMB, CKMBINDEX, TROPONINI in the last 168 hours.  BNP (last 3 results) No results for input(s): BNP in the last 8760 hours.  ProBNP (last 3 results) No results for input(s): PROBNP in the last 8760 hours.  Radiological Exams: No results  found.  Assessment/Plan Active Problems:   Acute on chronic respiratory failure with hypoxia (HCC)   TBI (traumatic brain injury) (HCC)   Lobar pneumonia, unspecified organism (HCC)   Spinal cord injury, cervical region (HCC)   1. Acute on chronic respiratory failure with hypoxia continue with T collar trials and titrate oxygen per protocol.  Secretions continue to improve, if this persists will consider capping and possible decannulation down the road 2. Traumatic brain injury grossly unchanged 3. Lobar pneumonia treated continue to follow 4. Spinal cord injury cervical region continue supportive care   I have personally seen and evaluated the patient, evaluated laboratory and imaging results, formulated the assessment and plan and placed orders. The Patient requires high complexity decision making for assessment and support.  Case was discussed on Rounds with the Respiratory Therapy Staff  Yevonne Pax, MD [facility] Pulmonary Critical Care Medicine Sleep Medicine

## 2018-10-18 ENCOUNTER — Other Ambulatory Visit (HOSPITAL_COMMUNITY): Payer: Self-pay

## 2018-10-18 LAB — BASIC METABOLIC PANEL
Anion gap: 10 (ref 5–15)
BUN: 71 mg/dL — ABNORMAL HIGH (ref 8–23)
CO2: 21 mmol/L — ABNORMAL LOW (ref 22–32)
Calcium: 9.5 mg/dL (ref 8.9–10.3)
Chloride: 123 mmol/L — ABNORMAL HIGH (ref 98–111)
Creatinine, Ser: 1.6 mg/dL — ABNORMAL HIGH (ref 0.61–1.24)
GFR calc Af Amer: 51 mL/min — ABNORMAL LOW (ref >60)
GFR calc non Af Amer: 44 mL/min — ABNORMAL LOW (ref >60)
Glucose, Bld: 103 mg/dL — ABNORMAL HIGH (ref 70–99)
Potassium: 3.1 mmol/L — ABNORMAL LOW (ref 3.5–5.1)
Sodium: 154 mmol/L — ABNORMAL HIGH (ref 135–145)

## 2018-10-18 LAB — CBC
HCT: 29.7 % — ABNORMAL LOW (ref 39.0–52.0)
Hemoglobin: 8.7 g/dL — ABNORMAL LOW (ref 13.0–17.0)
MCH: 26.7 pg (ref 26.0–34.0)
MCHC: 29.3 g/dL — ABNORMAL LOW (ref 30.0–36.0)
MCV: 91.1 fL (ref 80.0–100.0)
Platelets: 543 10*3/uL — ABNORMAL HIGH (ref 150–400)
RBC: 3.26 MIL/uL — ABNORMAL LOW (ref 4.22–5.81)
RDW: 16.2 % — ABNORMAL HIGH (ref 11.5–15.5)
WBC: 7.7 10*3/uL (ref 4.0–10.5)
nRBC: 0 % (ref 0.0–0.2)

## 2018-10-18 NOTE — Progress Notes (Addendum)
Pulmonary Critical Care Medicine [facility] [place]   PULMONARY CRITICAL CARE SERVICE  PROGRESS NOTE  Date of Service: 10/18/2018  Wayne Buckley  [id number]  DOB: 1950/09/12   DOA: 09/29/2018  Referring Physician: Carron Curie, MD  HPI: Wayne Buckley is a 68 y.o. male seen for follow up of Acute on Chronic Respiratory Failure.  Patient currently on 30% aerosol trach collar.  Patient secretions are now thinning as they have been copious and thick as of recently.  Has had some desaturations down to the 70s but appears to be doing well on the 30% trach collar at this time.  Medications: Reviewed on Rounds  Physical Exam:  Vitals: Pulse 103 respirations 34 BP 136/74 O2 sat 95% temp 98.1  Ventilator Settings ATC 30%  . General: Comfortable at this time . Eyes: Grossly normal lids, irises & conjunctiva . ENT: grossly tongue is normal . Neck: no obvious mass . Cardiovascular: S1 S2 normal no gallop . Respiratory: Coarse breath sounds . Abdomen: soft . Skin: no rash seen on limited exam . Musculoskeletal: not rigid . Psychiatric:unable to assess . Neurologic: no seizure no involuntary movements         Lab Data:   Basic Metabolic Panel: Recent Labs  Lab 10/13/18 0438  NA 147*  K 3.5  CL 114*  CO2 24  GLUCOSE 99  BUN 58*  CREATININE 1.64*  CALCIUM 9.8    ABG: No results for input(s): PHART, PCO2ART, PO2ART, HCO3, O2SAT in the last 168 hours.  Liver Function Tests: No results for input(s): AST, ALT, ALKPHOS, BILITOT, PROT, ALBUMIN in the last 168 hours. No results for input(s): LIPASE, AMYLASE in the last 168 hours. No results for input(s): AMMONIA in the last 168 hours.  CBC: Recent Labs  Lab 10/13/18 0438  WBC 7.4  HGB 8.2*  HCT 26.5*  MCV 88.3  PLT 610*    Cardiac Enzymes: No results for input(s): CKTOTAL, CKMB, CKMBINDEX, TROPONINI in the last 168 hours.  BNP (last 3 results) No results for input(s): BNP in the last 8760 hours.  ProBNP  (last 3 results) No results for input(s): PROBNP in the last 8760 hours.  Radiological Exams: Dg Chest Port 1 View  Result Date: 10/18/2018 CLINICAL DATA:  Pneumonia. EXAM: PORTABLE CHEST 1 VIEW COMPARISON:  10/14/2018 FINDINGS: Stable appearance of tracheostomy. Aeration of the lungs appears improved since the prior study with some residual opacity remaining at the right lung base and atelectasis/scarring in the left perihilar region. No overt edema or pleural fluid. No pneumothorax. Stable heart size. IMPRESSION: Improved aeration bilaterally with some residual opacity at the right lung base and atelectasis/scarring in the left perihilar lung. Electronically Signed   By: Irish Lack M.D.   On: 10/18/2018 14:34    Assessment/Plan Active Problems:   Acute on chronic respiratory failure with hypoxia (HCC)   TBI (traumatic brain injury) (HCC)   Lobar pneumonia, unspecified organism (HCC)   Spinal cord injury, cervical region (HCC)   1. Acute on chronic respiratory failure with hypoxia continue with trach collar trials and secretion management. 2. Traumatic brain injury grossly unchanged 3. Lobar pneumonia treated continue to follow 4. Spinal cord injury cervical region continue supportive care   I have personally seen and evaluated the patient, evaluated laboratory and imaging results, formulated the assessment and plan and placed orders. The Patient requires high complexity decision making for assessment and support.  Case was discussed on Rounds with the Respiratory Therapy Staff  Saadat A  Humphrey Rolls, MD [facility] Pulmonary Critical Care Medicine Sleep Medicine

## 2018-10-19 LAB — BASIC METABOLIC PANEL
Anion gap: 9 (ref 5–15)
BUN: 69 mg/dL — ABNORMAL HIGH (ref 8–23)
CO2: 22 mmol/L (ref 22–32)
Calcium: 9.3 mg/dL (ref 8.9–10.3)
Chloride: 120 mmol/L — ABNORMAL HIGH (ref 98–111)
Creatinine, Ser: 1.56 mg/dL — ABNORMAL HIGH (ref 0.61–1.24)
GFR calc Af Amer: 52 mL/min — ABNORMAL LOW (ref >60)
GFR calc non Af Amer: 45 mL/min — ABNORMAL LOW (ref >60)
Glucose, Bld: 107 mg/dL — ABNORMAL HIGH (ref 70–99)
Potassium: 3.2 mmol/L — ABNORMAL LOW (ref 3.5–5.1)
Sodium: 151 mmol/L — ABNORMAL HIGH (ref 135–145)

## 2018-10-19 LAB — AMIKACIN, TROUGH: Amikacin Tr: 9.3 ug/mL — ABNORMAL HIGH (ref 1.0–8.0)

## 2018-10-19 NOTE — Progress Notes (Addendum)
Pulmonary Critical Care Medicine [facility] [place]   PULMONARY CRITICAL CARE SERVICE  PROGRESS NOTE  Date of Service: 10/19/2018  Wayne Buckley  [id number]  DOB: 04/07/1951   DOA: 09/29/2018  Referring Physician: Carron Curie, MD  HPI: Wayne Buckley is a 68 y.o. male seen for follow up of Acute on Chronic Respiratory Failure.  Patient mains on aerosol trach collar 28% FiO2 satting well no distress noted.  Medications: Reviewed on Rounds  Physical Exam:  Vitals: Pulse 84 respirations 35 BP 148/76 O2 sat 100% temp 98.2  Ventilator Settings ATC 28%  . General: Comfortable at this time . Eyes: Grossly normal lids, irises & conjunctiva . ENT: grossly tongue is normal . Neck: no obvious mass . Cardiovascular: S1 S2 normal no gallop . Respiratory: Coarse breath sounds . Abdomen: soft . Skin: no rash seen on limited exam . Musculoskeletal: not rigid . Psychiatric:unable to assess . Neurologic: no seizure no involuntary movements         Lab Data:   Basic Metabolic Panel: Recent Labs  Lab 10/13/18 0438 10/18/18 1530 10/19/18 0330  NA 147* 154* 151*  K 3.5 3.1* 3.2*  CL 114* 123* 120*  CO2 24 21* 22  GLUCOSE 99 103* 107*  BUN 58* 71* 69*  CREATININE 1.64* 1.60* 1.56*  CALCIUM 9.8 9.5 9.3    ABG: No results for input(s): PHART, PCO2ART, PO2ART, HCO3, O2SAT in the last 168 hours.  Liver Function Tests: No results for input(s): AST, ALT, ALKPHOS, BILITOT, PROT, ALBUMIN in the last 168 hours. No results for input(s): LIPASE, AMYLASE in the last 168 hours. No results for input(s): AMMONIA in the last 168 hours.  CBC: Recent Labs  Lab 10/13/18 0438 10/18/18 1530  WBC 7.4 7.7  HGB 8.2* 8.7*  HCT 26.5* 29.7*  MCV 88.3 91.1  PLT 610* 543*    Cardiac Enzymes: No results for input(s): CKTOTAL, CKMB, CKMBINDEX, TROPONINI in the last 168 hours.  BNP (last 3 results) No results for input(s): BNP in the last 8760 hours.  ProBNP (last 3 results) No  results for input(s): PROBNP in the last 8760 hours.  Radiological Exams: Dg Chest Port 1 View  Result Date: 10/18/2018 CLINICAL DATA:  Pneumonia. EXAM: PORTABLE CHEST 1 VIEW COMPARISON:  10/14/2018 FINDINGS: Stable appearance of tracheostomy. Aeration of the lungs appears improved since the prior study with some residual opacity remaining at the right lung base and atelectasis/scarring in the left perihilar region. No overt edema or pleural fluid. No pneumothorax. Stable heart size. IMPRESSION: Improved aeration bilaterally with some residual opacity at the right lung base and atelectasis/scarring in the left perihilar lung. Electronically Signed   By: Irish Lack M.D.   On: 10/18/2018 14:34    Assessment/Plan Active Problems:   Acute on chronic respiratory failure with hypoxia (HCC)   TBI (traumatic brain injury) (HCC)   Lobar pneumonia, unspecified organism (HCC)   Spinal cord injury, cervical region (HCC)   1. Acute on chronic respiratory failure with hypoxia continue with trach collar trials and secretion management 2. Traumatic brain injury grossly unchanged 3. Lobar pneumonia treated continue to follow 4. Spinal cord injury cervical region continue supportive care   I have personally seen and evaluated the patient, evaluated laboratory and imaging results, formulated the assessment and plan and placed orders. The Patient requires high complexity decision making for assessment and support.  Case was discussed on Rounds with the Respiratory Therapy Staff  Yevonne Pax, MD [facility] Pulmonary Critical Care  Medicine Sleep Medicine

## 2018-10-20 LAB — BASIC METABOLIC PANEL
Anion gap: 9 (ref 5–15)
BUN: 64 mg/dL — ABNORMAL HIGH (ref 8–23)
CO2: 24 mmol/L (ref 22–32)
Calcium: 9.3 mg/dL (ref 8.9–10.3)
Chloride: 118 mmol/L — ABNORMAL HIGH (ref 98–111)
Creatinine, Ser: 1.46 mg/dL — ABNORMAL HIGH (ref 0.61–1.24)
GFR calc Af Amer: 56 mL/min — ABNORMAL LOW (ref >60)
GFR calc non Af Amer: 49 mL/min — ABNORMAL LOW (ref >60)
Glucose, Bld: 91 mg/dL (ref 70–99)
Potassium: 3.6 mmol/L (ref 3.5–5.1)
Sodium: 151 mmol/L — ABNORMAL HIGH (ref 135–145)

## 2018-10-20 NOTE — Progress Notes (Addendum)
Pulmonary Critical Care Medicine [facility] [place]   PULMONARY CRITICAL CARE SERVICE  PROGRESS NOTE  Date of Service: 10/20/2018  Wayne Buckley  CMK:[id number]  DOB: Sep 13, 1950   DOA: 09/29/2018  Referring Physician: Carron Curie, MD  HPI: Wayne Buckley is a 68 y.o. male seen for follow up of Acute on Chronic Respiratory Failure.  Patient continues on 20% aerosol trach collar has minimal secretions noted at this time.  Patient is talking today which is new for him.  Satting 100%.  Medications: Reviewed on Rounds  Physical Exam:  Vitals: Pulse 88 respirations 30 BP 108/83 O2 sat 100% temp 97.5  Ventilator Settings ATC 28%  . General: Comfortable at this time . Eyes: Grossly normal lids, irises & conjunctiva . ENT: grossly tongue is normal . Neck: no obvious mass . Cardiovascular: S1 S2 normal no gallop . Respiratory: Coarse breath sounds . Abdomen: soft . Skin: no rash seen on limited exam . Musculoskeletal: not rigid . Psychiatric:unable to assess . Neurologic: no seizure no involuntary movements         Lab Data:   Basic Metabolic Panel: Recent Labs  Lab 10/18/18 1530 10/19/18 0330 10/20/18 0536  NA 154* 151* 151*  K 3.1* 3.2* 3.6  CL 123* 120* 118*  CO2 21* 22 24  GLUCOSE 103* 107* 91  BUN 71* 69* 64*  CREATININE 1.60* 1.56* 1.46*  CALCIUM 9.5 9.3 9.3    ABG: No results for input(s): PHART, PCO2ART, PO2ART, HCO3, O2SAT in the last 168 hours.  Liver Function Tests: No results for input(s): AST, ALT, ALKPHOS, BILITOT, PROT, ALBUMIN in the last 168 hours. No results for input(s): LIPASE, AMYLASE in the last 168 hours. No results for input(s): AMMONIA in the last 168 hours.  CBC: Recent Labs  Lab 10/18/18 1530  WBC 7.7  HGB 8.7*  HCT 29.7*  MCV 91.1  PLT 543*    Cardiac Enzymes: No results for input(s): CKTOTAL, CKMB, CKMBINDEX, TROPONINI in the last 168 hours.  BNP (last 3 results) No results for input(s): BNP in the last 8760  hours.  ProBNP (last 3 results) No results for input(s): PROBNP in the last 8760 hours.  Radiological Exams: Dg Chest Port 1 View  Result Date: 10/18/2018 CLINICAL DATA:  Pneumonia. EXAM: PORTABLE CHEST 1 VIEW COMPARISON:  10/14/2018 FINDINGS: Stable appearance of tracheostomy. Aeration of the lungs appears improved since the prior study with some residual opacity remaining at the right lung base and atelectasis/scarring in the left perihilar region. No overt edema or pleural fluid. No pneumothorax. Stable heart size. IMPRESSION: Improved aeration bilaterally with some residual opacity at the right lung base and atelectasis/scarring in the left perihilar lung. Electronically Signed   By: Irish Lack M.D.   On: 10/18/2018 14:34    Assessment/Plan Active Problems:   Acute on chronic respiratory failure with hypoxia (HCC)   TBI (traumatic brain injury) (HCC)   Lobar pneumonia, unspecified organism (HCC)   Spinal cord injury, cervical region (HCC)   1. Acute on chronic respiratory failure with hypoxia continue with trach collar trials and secretion management 2. Traumatic brain injury grossly unchanged 3. Lobar pneumonia treated continue to follow 4. Spinal cord injury cervical region continue supportive care   I have personally seen and evaluated the patient, evaluated laboratory and imaging results, formulated the assessment and plan and placed orders. The Patient requires high complexity decision making for assessment and support.  Case was discussed on Rounds with the Respiratory Therapy Staff  [facility]  Richardson Dopp, MD [facility] Pulmonary Critical Care Medicine Sleep Medicine

## 2018-10-21 LAB — BASIC METABOLIC PANEL
Anion gap: 8 (ref 5–15)
BUN: 61 mg/dL — ABNORMAL HIGH (ref 8–23)
CO2: 25 mmol/L (ref 22–32)
Calcium: 8.9 mg/dL (ref 8.9–10.3)
Chloride: 110 mmol/L (ref 98–111)
Creatinine, Ser: 1.41 mg/dL — ABNORMAL HIGH (ref 0.61–1.24)
GFR calc Af Amer: 59 mL/min — ABNORMAL LOW (ref >60)
GFR calc non Af Amer: 51 mL/min — ABNORMAL LOW (ref >60)
Glucose, Bld: 106 mg/dL — ABNORMAL HIGH (ref 70–99)
Potassium: 3.5 mmol/L (ref 3.5–5.1)
Sodium: 143 mmol/L (ref 135–145)

## 2018-10-21 NOTE — Progress Notes (Addendum)
Pulmonary Critical Care Medicine [facility] [place]   PULMONARY CRITICAL CARE SERVICE  PROGRESS NOTE  Date of Service: 10/21/2018  Wayne Buckley  [id number]  DOB: 07-19-1950   DOA: 09/29/2018  Referring Physician: Carron Curie, MD  HPI: Wayne Buckley is a 68 y.o. male seen for follow up of Acute on Chronic Respiratory Failure.  Patient remains on aerosol trach collar 28% FiO2 at baseline doing well.  Good saturations are noted.  Patient is supposed to be discharging to inpatient rehab today.  Medications: Reviewed on Rounds  Physical Exam:  Vitals: Pulse 75 respirations 28 BP 124/78 O2 sat 99% temp 96.9  Ventilator Settings ATC 28%  . General: Comfortable at this time . Eyes: Grossly normal lids, irises & conjunctiva . ENT: grossly tongue is normal . Neck: no obvious mass . Cardiovascular: S1 S2 normal no gallop . Respiratory: Coarse breath sounds . Abdomen: soft . Skin: no rash seen on limited exam . Musculoskeletal: not rigid . Psychiatric:unable to assess . Neurologic: no seizure no involuntary movements         Lab Data:   Basic Metabolic Panel: Recent Labs  Lab 10/18/18 1530 10/19/18 0330 10/20/18 0536 10/21/18 0710  NA 154* 151* 151* 143  K 3.1* 3.2* 3.6 3.5  CL 123* 120* 118* 110  CO2 21* 22 24 25   GLUCOSE 103* 107* 91 106*  BUN 71* 69* 64* 61*  CREATININE 1.60* 1.56* 1.46* 1.41*  CALCIUM 9.5 9.3 9.3 8.9    ABG: No results for input(s): PHART, PCO2ART, PO2ART, HCO3, O2SAT in the last 168 hours.  Liver Function Tests: No results for input(s): AST, ALT, ALKPHOS, BILITOT, PROT, ALBUMIN in the last 168 hours. No results for input(s): LIPASE, AMYLASE in the last 168 hours. No results for input(s): AMMONIA in the last 168 hours.  CBC: Recent Labs  Lab 10/18/18 1530  WBC 7.7  HGB 8.7*  HCT 29.7*  MCV 91.1  PLT 543*    Cardiac Enzymes: No results for input(s): CKTOTAL, CKMB, CKMBINDEX, TROPONINI in the last 168 hours.  BNP (last 3  results) No results for input(s): BNP in the last 8760 hours.  ProBNP (last 3 results) No results for input(s): PROBNP in the last 8760 hours.  Radiological Exams: No results found.  Assessment/Plan Active Problems:   Acute on chronic respiratory failure with hypoxia (HCC)   TBI (traumatic brain injury) (HCC)   Lobar pneumonia, unspecified organism (HCC)   Spinal cord injury, cervical region (HCC)   1. Acute on chronic respiratory failure with hypoxia continue using trach collar as patient appears to be at baseline.  Continue secretion management.  Patient will likely discharge today or early in the morning for rehab.  Continue supportive measures 2. Traumatic brain injury grossly unchanged 3. Lobar pneumonia treated continue to follow 4. Spinal cord injury cervical region continue supportive care   I have personally seen and evaluated the patient, evaluated laboratory and imaging results, formulated the assessment and plan and placed orders. The Patient requires high complexity decision making for assessment and support.  Case was discussed on Rounds with the Respiratory Therapy Staff  Yevonne Pax, MD [facility] Pulmonary Critical Care Medicine Sleep Medicine

## 2018-10-28 ENCOUNTER — Telehealth: Payer: Self-pay | Admitting: Infectious Diseases

## 2018-10-28 NOTE — Telephone Encounter (Signed)
Spoke with charge nurse at [facility] and let her know that this pt was exposed to a COVID+ staff member while at [facility].  He has been d/c to a SNF- [facility] in [place]. I contacted them and asked them to test him.

## 2018-11-18 DEATH — deceased

## 2019-06-15 IMAGING — CT CT ANGIO CHEST
2 of 7 series · 18 of 46 positions shown · IV contrast (APPLIED)
Comparison: 07/25/2018

CLINICAL DATA: Acute hypoxic respiratory failure

EXAM:
CT ANGIOGRAPHY CHEST WITH CONTRAST
TECHNIQUE: Multidetector CT imaging of the chest was performed using the
standard protocol during bolus administration of intravenous
contrast. Multiplanar CT image reconstructions and MIPs were
obtained to evaluate the vascular anatomy.
CONTRAST:  100mL [id number]-JGX IOPAMIDOL ([id number]-JGX) INJECTION 76%

[Series 12: thins · axial · 0.93mm/px · z∈[-94,+205]mm · 15 of 482 slices shown]
[im 27/482  lung]
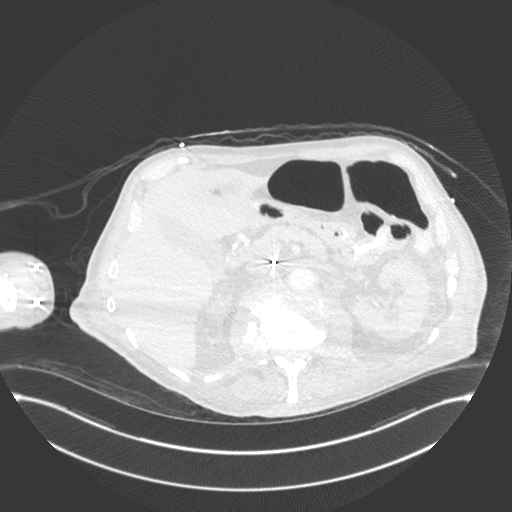
[im 54/482  soft-tissue]
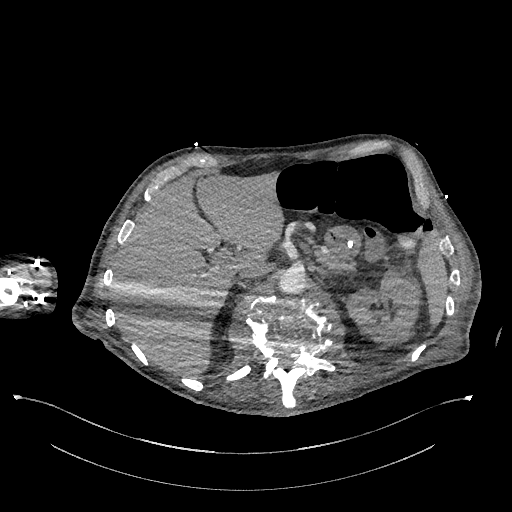
[im 81/482  lung]
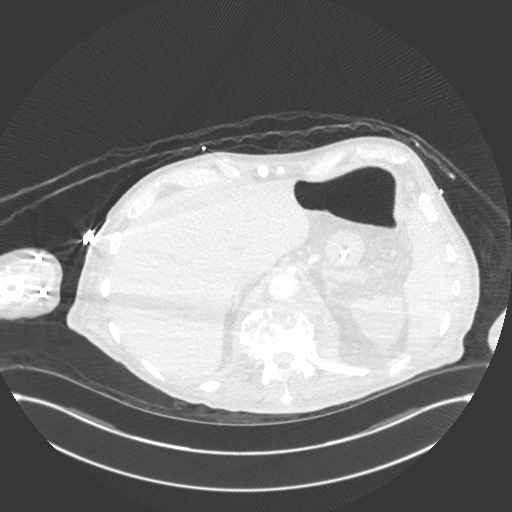
[im 107/482  soft-tissue]
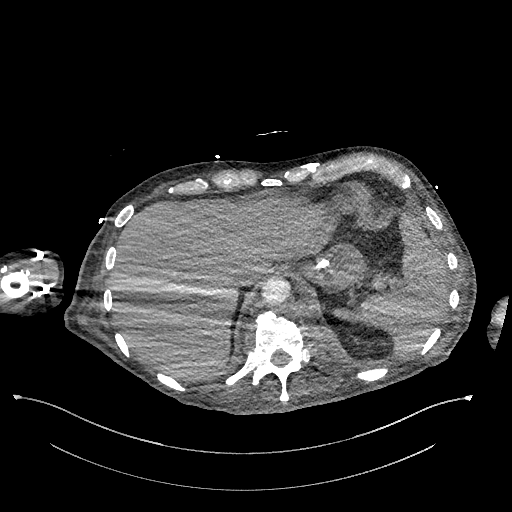
[im 161/482  lung]
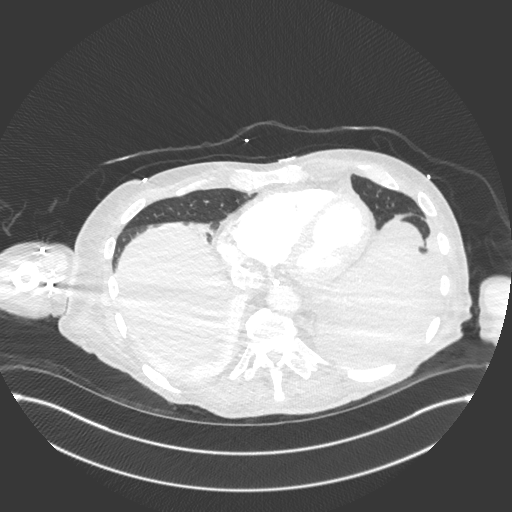
[im 188/482  soft-tissue]
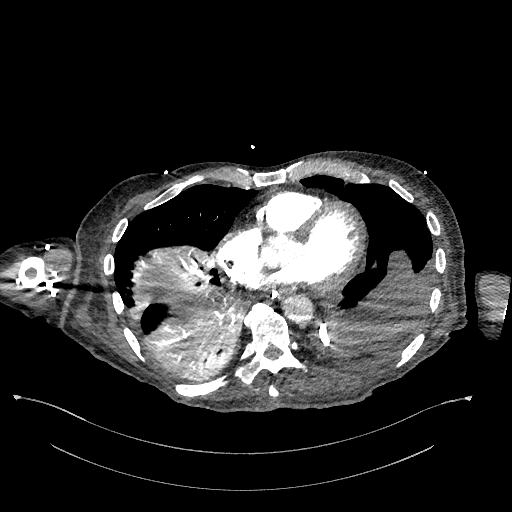
[im 214/482  lung]
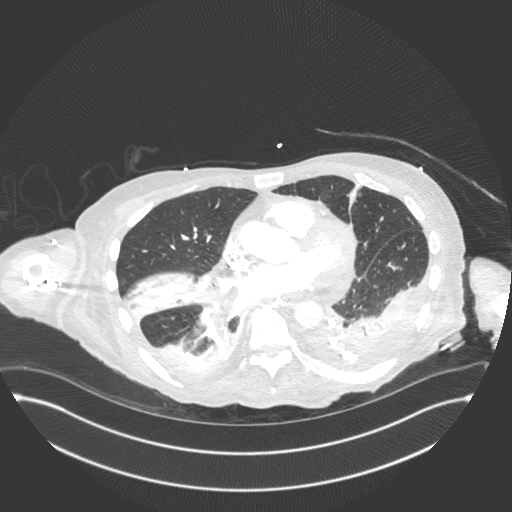
[im 241/482  soft-tissue]
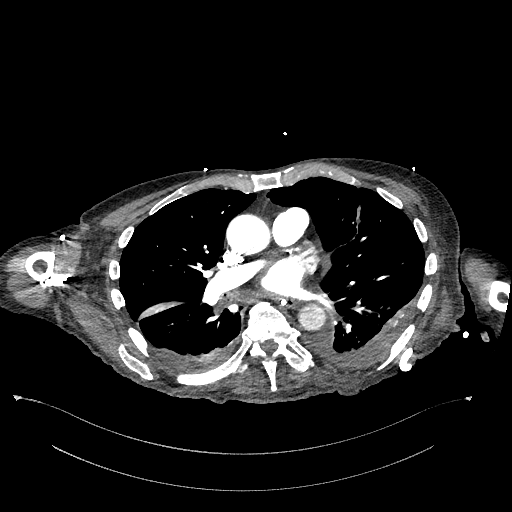
[im 268/482  lung]
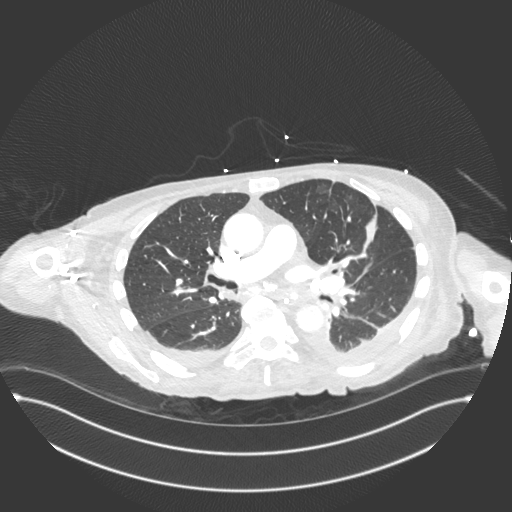
[im 294/482  soft-tissue]
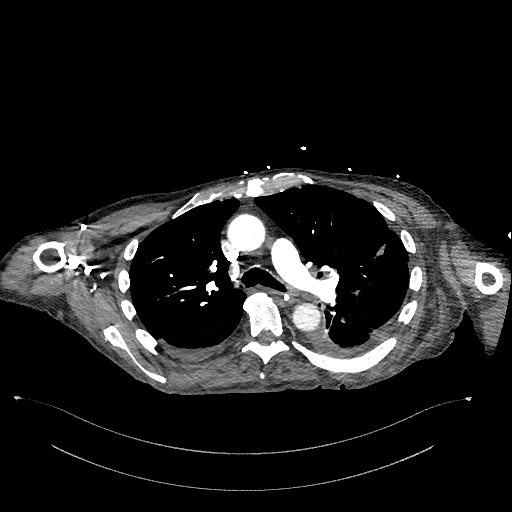
[im 321/482  lung]
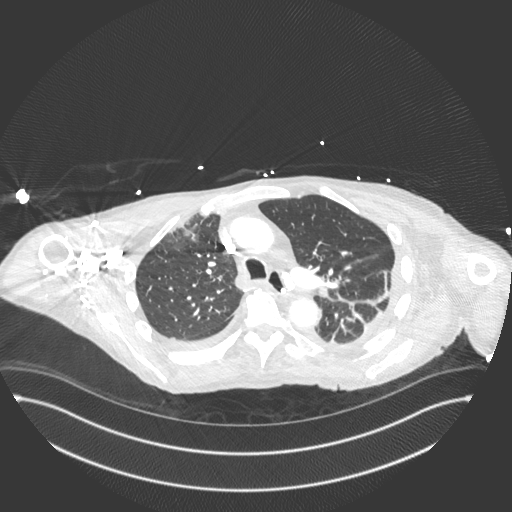
[im 375/482  soft-tissue]
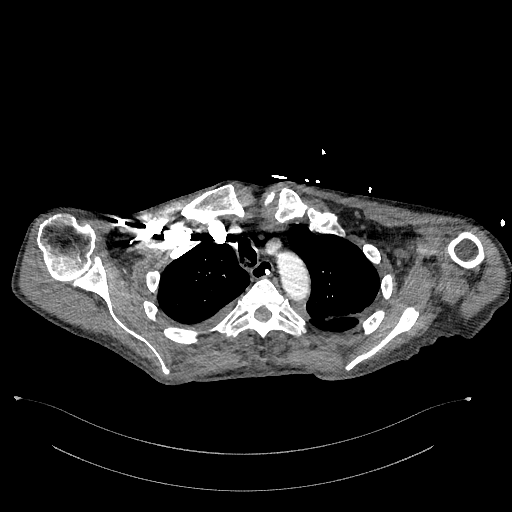
[im 401/482  lung]
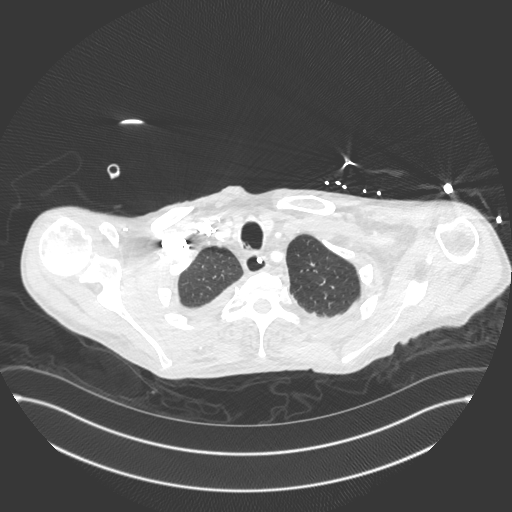
[im 428/482  soft-tissue]
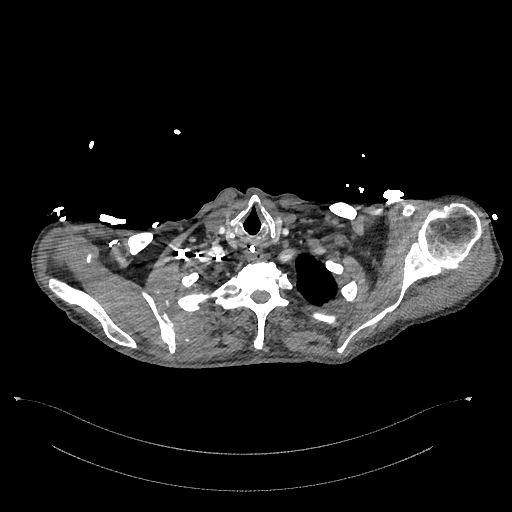
[im 455/482  lung]
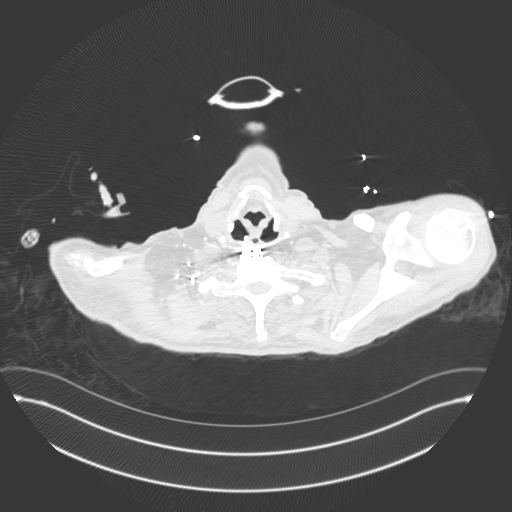

[Series 13: cor · coronal · 0.70mm/px · 3 of 129 slices shown]
[im 33/129  soft-tissue]
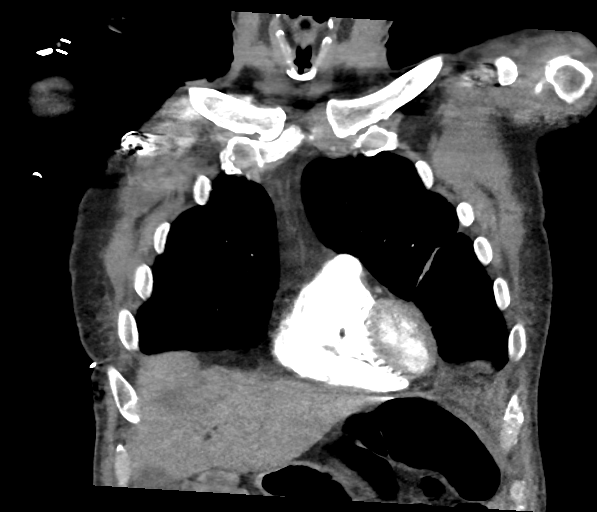
[im 65/129  soft-tissue]
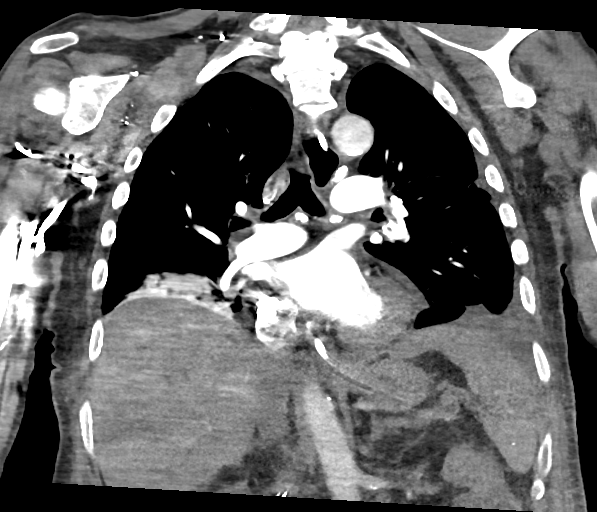
[im 97/129  soft-tissue]
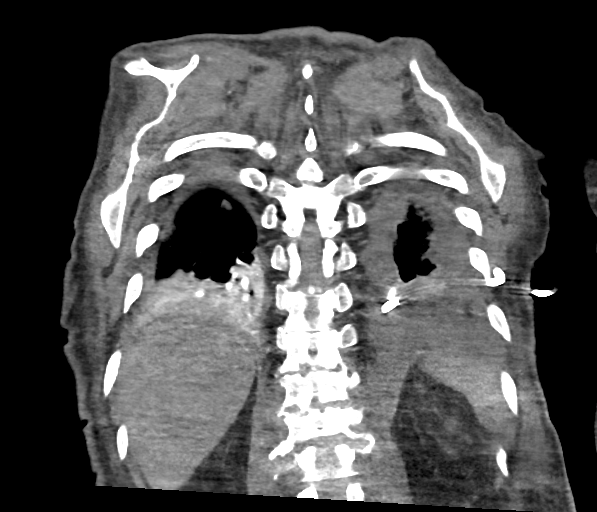

[18 of 46 positions shown; findings below may reference images not displayed]

FINDINGS: Cardiovascular: Satisfactory opacification of the pulmonary arteries
to the segmental level. No evidence of pulmonary embolism. Normal
heart size. No pericardial effusion.

Mediastinum/Nodes: Subcarinal and left mediastinal/hilar calcified
lymph nodes usually from remote granulomatous disease.

Lungs/Pleura: Extensive endobronchial debris in the right middle and
lower lobes which are collapsed. There is also atelectasis in the
left lower lobe. Small pleural effusions, larger on the left where
there is a pleural catheter. Calcified granuloma in the left lung.

Upper Abdomen: IVC filter and feeding tube.

Musculoskeletal: Discitis osteomyelitis at T10-11 to T12-L1 with the
greatest erosion at T12. There is extensive paravertebral phlegmon.
Canal collection seen on most recent MRI 07/08/2018. No clear
progression by CT.

Review of the MIP images confirms the above findings.
IMPRESSION: 1. Negative for pulmonary embolism.
2. Right middle and lower lobe collapse with extensive endobronchial
debris, consider aspiration.
3. Left pleural catheter with small pleural effusion that is
dependent and unchanged.
4. Known multilevel lower thoracic discitis osteomyelitis

## 2019-06-17 IMAGING — DX DG CHEST 1V PORT
1 series · 1 of 1 positions shown · non-contrast
Comparison: Earlier today. Chest CTA dated 08/06/2018.

CLINICAL DATA: Intubated.

EXAM:
PORTABLE CHEST 1 VIEW

[chest ap]
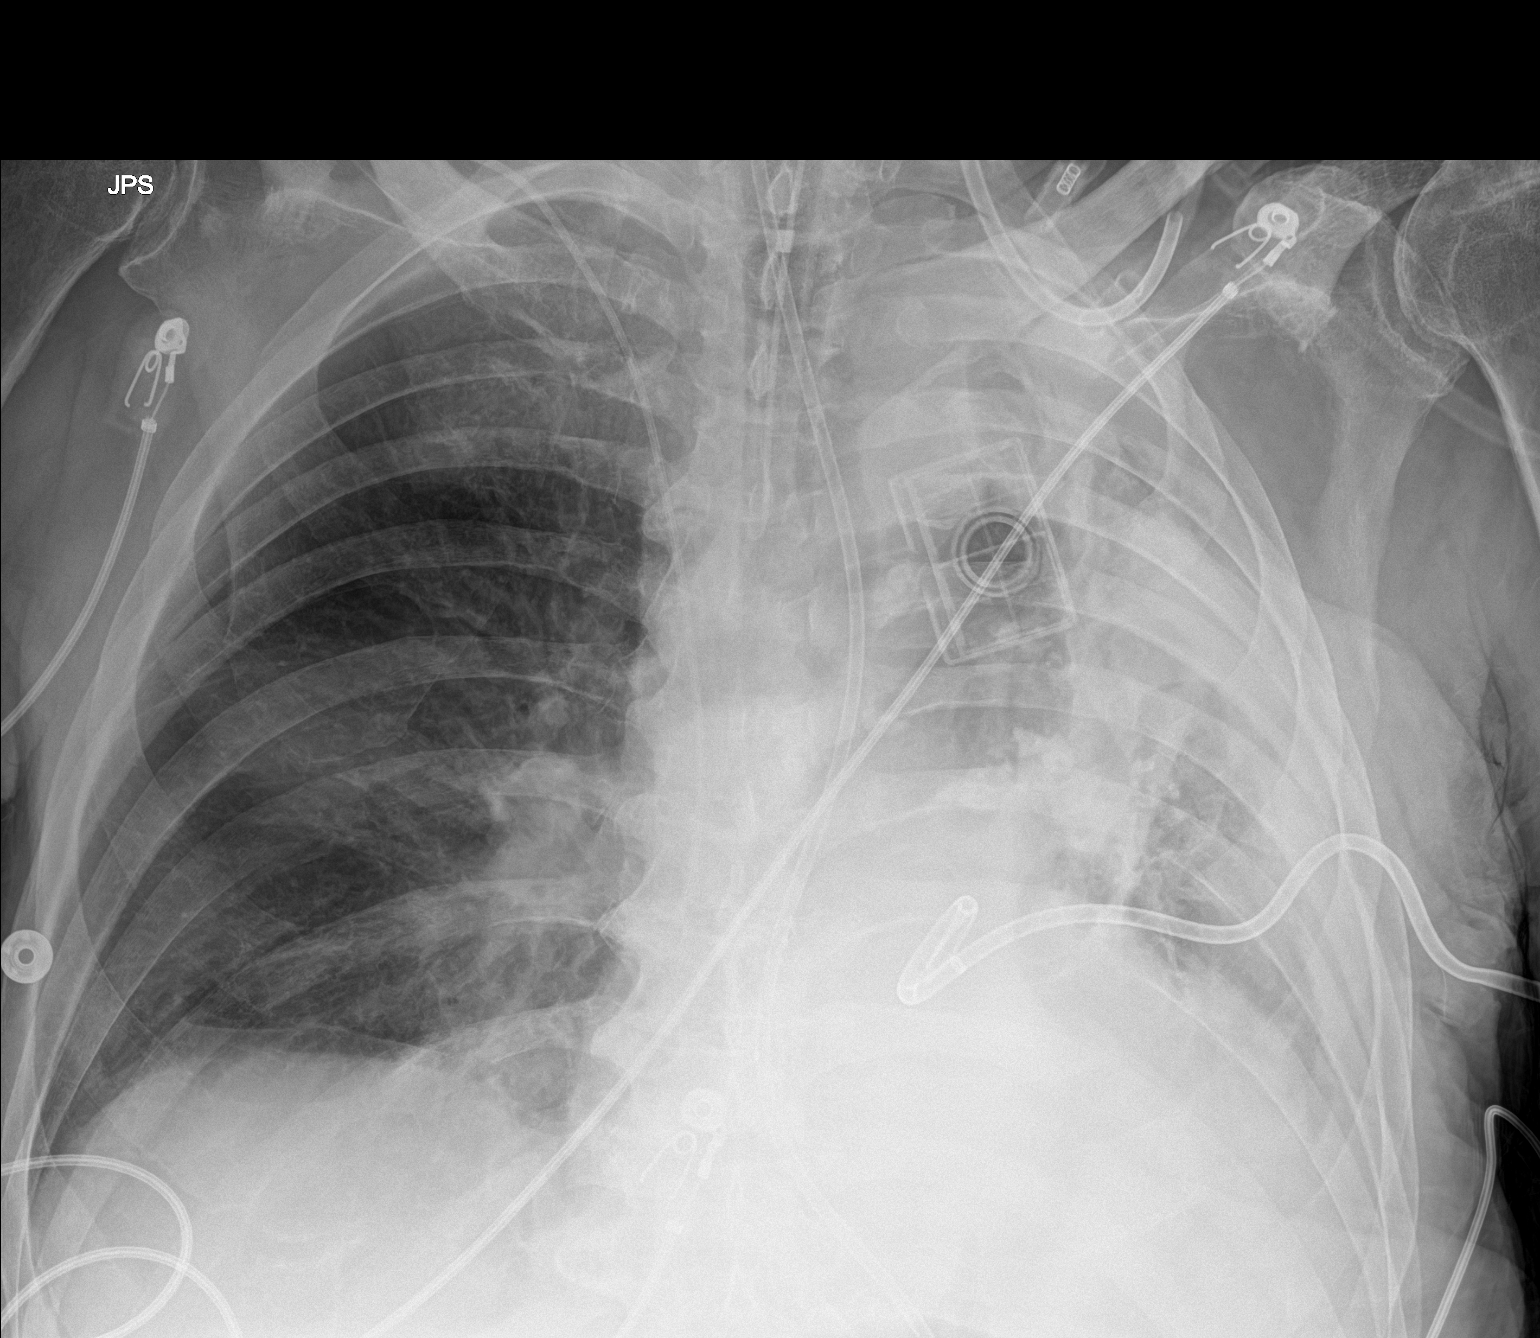

[1 of 1 positions shown; findings below may reference images not displayed]

FINDINGS: Endotracheal tube tip 4.6 cm above the carina. Right jugular
catheter tip in inferior aspect of the superior vena cava. Feeding
tube extending into the stomach. Significant increase in pleural and
parenchymal opacity on the left. Interval minimal ill-defined right
lower lung zone opacity. The left lower chest pigtail catheter is
unchanged with the exception of lack of previously suspected
kinking. The cardiac silhouette remains borderline enlarged.
Multiple calcified lymph nodes are again demonstrated. Thoracic
spine degenerative changes and cervical spine fixation hardware.
IMPRESSION: 1. Significant increase in pleural fluid and atelectasis/pneumonia
on the left.
2. Interval minimal patchy right basilar atelectasis or pneumonia.
